# Patient Record
Sex: Male | Born: 1935
Health system: Southern US, Community
[De-identification: ages and names within clinical notes are randomized; demographics above are authoritative.]

## PROBLEM LIST (undated history)

## (undated) DIAGNOSIS — K5790 Diverticulosis of intestine, part unspecified, without perforation or abscess without bleeding: Secondary | ICD-10-CM

## (undated) DIAGNOSIS — R251 Tremor, unspecified: Secondary | ICD-10-CM

## (undated) DIAGNOSIS — N134 Hydroureter: Secondary | ICD-10-CM

## (undated) DIAGNOSIS — I4821 Permanent atrial fibrillation: Secondary | ICD-10-CM

## (undated) DIAGNOSIS — R339 Retention of urine, unspecified: Secondary | ICD-10-CM

## (undated) DIAGNOSIS — I739 Peripheral vascular disease, unspecified: Secondary | ICD-10-CM

## (undated) DIAGNOSIS — N1330 Unspecified hydronephrosis: Secondary | ICD-10-CM

## (undated) DIAGNOSIS — I10 Essential (primary) hypertension: Secondary | ICD-10-CM

## (undated) DIAGNOSIS — G4733 Obstructive sleep apnea (adult) (pediatric): Secondary | ICD-10-CM

## (undated) DIAGNOSIS — H8190 Unspecified disorder of vestibular function, unspecified ear: Secondary | ICD-10-CM

## (undated) DIAGNOSIS — N471 Phimosis: Secondary | ICD-10-CM

## (undated) DIAGNOSIS — I2109 ST elevation (STEMI) myocardial infarction involving other coronary artery of anterior wall: Secondary | ICD-10-CM

## (undated) DIAGNOSIS — E662 Morbid (severe) obesity with alveolar hypoventilation: Secondary | ICD-10-CM

## (undated) DIAGNOSIS — M6282 Rhabdomyolysis: Secondary | ICD-10-CM

## (undated) DIAGNOSIS — J449 Chronic obstructive pulmonary disease, unspecified: Secondary | ICD-10-CM

## (undated) DIAGNOSIS — I4891 Unspecified atrial fibrillation: Secondary | ICD-10-CM

## (undated) DIAGNOSIS — H409 Unspecified glaucoma: Secondary | ICD-10-CM

## (undated) DIAGNOSIS — N529 Male erectile dysfunction, unspecified: Secondary | ICD-10-CM

## (undated) DIAGNOSIS — Z978 Presence of other specified devices: Secondary | ICD-10-CM

## (undated) DIAGNOSIS — R32 Unspecified urinary incontinence: Secondary | ICD-10-CM

## (undated) DIAGNOSIS — E291 Testicular hypofunction: Secondary | ICD-10-CM

## (undated) DIAGNOSIS — E876 Hypokalemia: Secondary | ICD-10-CM

## (undated) DIAGNOSIS — I509 Heart failure, unspecified: Secondary | ICD-10-CM

## (undated) DIAGNOSIS — I6529 Occlusion and stenosis of unspecified carotid artery: Secondary | ICD-10-CM

## (undated) DIAGNOSIS — I2510 Atherosclerotic heart disease of native coronary artery without angina pectoris: Secondary | ICD-10-CM

## (undated) DIAGNOSIS — I639 Cerebral infarction, unspecified: Secondary | ICD-10-CM

## (undated) DIAGNOSIS — E669 Obesity, unspecified: Secondary | ICD-10-CM

## (undated) DIAGNOSIS — J189 Pneumonia, unspecified organism: Secondary | ICD-10-CM

## (undated) DIAGNOSIS — N189 Chronic kidney disease, unspecified: Secondary | ICD-10-CM

## (undated) DIAGNOSIS — E7800 Pure hypercholesterolemia, unspecified: Secondary | ICD-10-CM

## (undated) DIAGNOSIS — R319 Hematuria, unspecified: Secondary | ICD-10-CM

## (undated) DIAGNOSIS — E119 Type 2 diabetes mellitus without complications: Secondary | ICD-10-CM

## (undated) DIAGNOSIS — I129 Hypertensive chronic kidney disease with stage 1 through stage 4 chronic kidney disease, or unspecified chronic kidney disease: Secondary | ICD-10-CM

## (undated) HISTORY — DX: Essential (primary) hypertension: I10

## (undated) HISTORY — DX: Hypertensive chronic kidney disease with stage 1 through stage 4 chronic kidney disease, or unspecified chronic kidney disease: I12.9

## (undated) HISTORY — DX: Type 2 diabetes mellitus without complications: E11.9

## (undated) HISTORY — PX: LUMBAR FUSION: SHX111

## (undated) HISTORY — DX: ST elevation (STEMI) myocardial infarction involving other coronary artery of anterior wall: I21.09

## (undated) HISTORY — DX: Unspecified atrial fibrillation: I48.91

## (undated) HISTORY — DX: Obesity, unspecified: E66.9

## (undated) HISTORY — DX: Chronic obstructive pulmonary disease, unspecified: J44.9

## (undated) HISTORY — DX: Atherosclerotic heart disease of native coronary artery without angina pectoris: I25.10

## (undated) HISTORY — PX: NOSE SURGERY: SHX723

## (undated) HISTORY — DX: Diverticulosis of intestine, part unspecified, without perforation or abscess without bleeding: K57.90

## (undated) HISTORY — DX: Hypokalemia: E87.6

## (undated) HISTORY — DX: Morbid (severe) obesity with alveolar hypoventilation: E66.2

## (undated) HISTORY — DX: Chronic kidney disease, unspecified: N18.9

## (undated) HISTORY — PX: CARDIAC CATHETERIZATION: SHX172

## (undated) HISTORY — DX: Heart failure, unspecified: I50.9

## (undated) HISTORY — DX: Rhabdomyolysis: M62.82

## (undated) HISTORY — DX: Cerebral infarction, unspecified: I63.9

## (undated) HISTORY — DX: Occlusion and stenosis of unspecified carotid artery: I65.29

## (undated) HISTORY — DX: Obstructive sleep apnea (adult) (pediatric): G47.33

## (undated) HISTORY — DX: Unspecified glaucoma: H40.9

## (undated) HISTORY — DX: Unspecified disorder of vestibular function, unspecified ear: H81.90

## (undated) HISTORY — DX: Pure hypercholesterolemia, unspecified: E78.00

---

## 1983-07-12 HISTORY — PX: APPENDECTOMY: SHX54

## 1983-07-12 HISTORY — PX: INGUINAL HERNIA REPAIR: SHX194

## 1989-07-11 HISTORY — PX: KNEE ARTHROSCOPY: SHX127

## 1995-08-14 HISTORY — PX: CORONARY ANGIOPLASTY: SHX604

## 2000-02-04 ENCOUNTER — Ambulatory Visit (HOSPITAL_COMMUNITY): Admission: RE | Admit: 2000-02-04 | Discharge: 2000-02-04 | Payer: Self-pay | Admitting: Cardiology

## 2000-02-04 ENCOUNTER — Encounter: Payer: Self-pay | Admitting: Cardiology

## 2000-06-10 HISTORY — PX: CHOLECYSTECTOMY: SHX55

## 2000-06-16 ENCOUNTER — Encounter: Admission: RE | Admit: 2000-06-16 | Discharge: 2000-06-16 | Payer: Self-pay

## 2000-06-23 ENCOUNTER — Encounter: Payer: Self-pay | Admitting: General Surgery

## 2000-06-26 ENCOUNTER — Observation Stay (HOSPITAL_COMMUNITY): Admission: RE | Admit: 2000-06-26 | Discharge: 2000-06-27 | Payer: Self-pay | Admitting: General Surgery

## 2000-06-26 ENCOUNTER — Encounter (INDEPENDENT_AMBULATORY_CARE_PROVIDER_SITE_OTHER): Payer: Self-pay

## 2003-04-16 ENCOUNTER — Encounter: Admission: RE | Admit: 2003-04-16 | Discharge: 2003-04-16 | Payer: Self-pay

## 2003-05-02 ENCOUNTER — Ambulatory Visit (HOSPITAL_COMMUNITY): Admission: RE | Admit: 2003-05-02 | Discharge: 2003-05-02 | Payer: Self-pay | Admitting: Otolaryngology

## 2003-12-08 ENCOUNTER — Emergency Department (HOSPITAL_COMMUNITY): Admission: EM | Admit: 2003-12-08 | Discharge: 2003-12-09 | Payer: Self-pay | Admitting: Emergency Medicine

## 2006-01-20 ENCOUNTER — Encounter: Admission: RE | Admit: 2006-01-20 | Discharge: 2006-01-20 | Payer: Self-pay | Admitting: Neurosurgery

## 2006-01-22 ENCOUNTER — Encounter: Admission: RE | Admit: 2006-01-22 | Discharge: 2006-01-22 | Payer: Self-pay | Admitting: Neurosurgery

## 2006-04-06 ENCOUNTER — Ambulatory Visit (HOSPITAL_COMMUNITY): Admission: RE | Admit: 2006-04-06 | Discharge: 2006-04-06 | Payer: Self-pay | Admitting: Cardiology

## 2006-05-08 ENCOUNTER — Inpatient Hospital Stay (HOSPITAL_COMMUNITY): Admission: RE | Admit: 2006-05-08 | Discharge: 2006-05-15 | Payer: Self-pay | Admitting: Neurosurgery

## 2006-05-09 ENCOUNTER — Ambulatory Visit: Payer: Self-pay | Admitting: Physical Medicine & Rehabilitation

## 2007-07-30 ENCOUNTER — Encounter (INDEPENDENT_AMBULATORY_CARE_PROVIDER_SITE_OTHER): Payer: Self-pay | Admitting: *Deleted

## 2007-07-30 ENCOUNTER — Ambulatory Visit (HOSPITAL_COMMUNITY): Admission: RE | Admit: 2007-07-30 | Discharge: 2007-07-30 | Payer: Self-pay | Admitting: *Deleted

## 2008-07-21 ENCOUNTER — Encounter: Admission: RE | Admit: 2008-07-21 | Discharge: 2008-07-21 | Payer: Self-pay | Admitting: Orthopaedic Surgery

## 2008-09-01 ENCOUNTER — Encounter (HOSPITAL_COMMUNITY): Admission: RE | Admit: 2008-09-01 | Discharge: 2008-11-30 | Payer: Self-pay | Admitting: Cardiology

## 2009-11-25 ENCOUNTER — Encounter: Admission: RE | Admit: 2009-11-25 | Discharge: 2009-11-25 | Payer: Self-pay | Admitting: Otolaryngology

## 2009-12-22 ENCOUNTER — Emergency Department (HOSPITAL_COMMUNITY): Admission: EM | Admit: 2009-12-22 | Discharge: 2009-12-22 | Payer: Self-pay | Admitting: Emergency Medicine

## 2009-12-23 ENCOUNTER — Inpatient Hospital Stay (HOSPITAL_COMMUNITY): Admission: EM | Admit: 2009-12-23 | Discharge: 2009-12-28 | Payer: Self-pay | Admitting: Emergency Medicine

## 2009-12-24 ENCOUNTER — Encounter (INDEPENDENT_AMBULATORY_CARE_PROVIDER_SITE_OTHER): Payer: Self-pay | Admitting: Internal Medicine

## 2009-12-24 ENCOUNTER — Ambulatory Visit: Payer: Self-pay | Admitting: Physical Medicine & Rehabilitation

## 2010-05-13 ENCOUNTER — Emergency Department (HOSPITAL_COMMUNITY): Admission: EM | Admit: 2010-05-13 | Discharge: 2010-05-13 | Payer: Self-pay | Admitting: Emergency Medicine

## 2010-08-11 ENCOUNTER — Emergency Department (HOSPITAL_COMMUNITY): Payer: Medicare Other

## 2010-08-11 ENCOUNTER — Inpatient Hospital Stay (HOSPITAL_COMMUNITY)
Admission: EM | Admit: 2010-08-11 | Discharge: 2010-08-13 | DRG: 312 | Disposition: A | Discharge status: Home / Self Care | Payer: Medicare Other | Attending: Cardiology | Admitting: Cardiology

## 2010-08-11 DIAGNOSIS — Z9181 History of falling: Secondary | ICD-10-CM

## 2010-08-11 DIAGNOSIS — E876 Hypokalemia: Secondary | ICD-10-CM | POA: Diagnosis present

## 2010-08-11 DIAGNOSIS — Z7982 Long term (current) use of aspirin: Secondary | ICD-10-CM

## 2010-08-11 DIAGNOSIS — K219 Gastro-esophageal reflux disease without esophagitis: Secondary | ICD-10-CM | POA: Diagnosis present

## 2010-08-11 DIAGNOSIS — Z9861 Coronary angioplasty status: Secondary | ICD-10-CM

## 2010-08-11 DIAGNOSIS — J440 Chronic obstructive pulmonary disease with acute lower respiratory infection: Secondary | ICD-10-CM | POA: Diagnosis present

## 2010-08-11 DIAGNOSIS — I10 Essential (primary) hypertension: Secondary | ICD-10-CM | POA: Diagnosis present

## 2010-08-11 DIAGNOSIS — E662 Morbid (severe) obesity with alveolar hypoventilation: Secondary | ICD-10-CM | POA: Diagnosis present

## 2010-08-11 DIAGNOSIS — I440 Atrioventricular block, first degree: Secondary | ICD-10-CM | POA: Diagnosis present

## 2010-08-11 DIAGNOSIS — Z8249 Family history of ischemic heart disease and other diseases of the circulatory system: Secondary | ICD-10-CM

## 2010-08-11 DIAGNOSIS — E119 Type 2 diabetes mellitus without complications: Secondary | ICD-10-CM | POA: Diagnosis present

## 2010-08-11 DIAGNOSIS — I4891 Unspecified atrial fibrillation: Secondary | ICD-10-CM | POA: Diagnosis present

## 2010-08-11 DIAGNOSIS — E7800 Pure hypercholesterolemia, unspecified: Secondary | ICD-10-CM | POA: Diagnosis present

## 2010-08-11 DIAGNOSIS — I2510 Atherosclerotic heart disease of native coronary artery without angina pectoris: Secondary | ICD-10-CM | POA: Diagnosis present

## 2010-08-11 DIAGNOSIS — I252 Old myocardial infarction: Secondary | ICD-10-CM

## 2010-08-11 DIAGNOSIS — R55 Syncope and collapse: Principal | ICD-10-CM | POA: Diagnosis present

## 2010-08-11 DIAGNOSIS — K5730 Diverticulosis of large intestine without perforation or abscess without bleeding: Secondary | ICD-10-CM | POA: Diagnosis present

## 2010-08-11 DIAGNOSIS — J209 Acute bronchitis, unspecified: Secondary | ICD-10-CM | POA: Diagnosis present

## 2010-08-11 DIAGNOSIS — Z7902 Long term (current) use of antithrombotics/antiplatelets: Secondary | ICD-10-CM

## 2010-08-11 DIAGNOSIS — H409 Unspecified glaucoma: Secondary | ICD-10-CM | POA: Diagnosis present

## 2010-08-11 DIAGNOSIS — G4733 Obstructive sleep apnea (adult) (pediatric): Secondary | ICD-10-CM | POA: Diagnosis present

## 2010-08-11 LAB — POCT CARDIAC MARKERS
CKMB, poc: 2.5 ng/mL (ref 1.0–8.0)
Troponin i, poc: <0.05 ng/mL (ref 0.00–0.09)

## 2010-08-11 LAB — COMPREHENSIVE METABOLIC PANEL
ALT: 22 U/L (ref 0–53)
AST: 18 U/L (ref 0–37)
Albumin: 3.5 g/dL (ref 3.5–5.2)
Calcium: 9.2 mg/dL (ref 8.4–10.5)
Creatinine, Ser: 1.28 mg/dL (ref 0.4–1.5)
GFR calc Af Amer: >60 mL/min (ref >60)
GFR calc non Af Amer: 55 mL/min — ABNORMAL LOW (ref >60)
Sodium: 145 mEq/L (ref 135–145)
Total Protein: 6.3 g/dL (ref 6.0–8.3)

## 2010-08-11 LAB — PROTIME-INR
INR: 1.1 (ref 0.00–1.49)
Prothrombin Time: 14.4 seconds (ref 11.6–15.2)

## 2010-08-11 LAB — CBC
MCH: 29.3 pg (ref 26.0–34.0)
Platelets: 155 10*3/uL (ref 150–400)
RBC: 5.05 MIL/uL (ref 4.22–5.81)
WBC: 11.7 10*3/uL — ABNORMAL HIGH (ref 4.0–10.5)

## 2010-08-11 LAB — DIFFERENTIAL
Basophils Relative: 0 % (ref 0–1)
Eosinophils Absolute: 0.1 10*3/uL (ref 0.0–0.7)
Neutrophils Relative %: 79 % — ABNORMAL HIGH (ref 43–77)

## 2010-08-11 LAB — APTT: aPTT: 29 seconds (ref 24–37)

## 2010-08-11 LAB — BASIC METABOLIC PANEL
BUN: 14 mg/dL (ref 6–23)
Chloride: 107 mEq/L (ref 96–112)
Creatinine, Ser: 1.35 mg/dL (ref 0.4–1.5)

## 2010-08-11 LAB — CK TOTAL AND CKMB (NOT AT ARMC): Relative Index: 1.1 (ref 0.0–2.5)

## 2010-08-11 LAB — GLUCOSE, CAPILLARY: Glucose-Capillary: 132 mg/dL — ABNORMAL HIGH (ref 70–99)

## 2010-08-11 LAB — TROPONIN I: Troponin I: 0.05 ng/mL (ref 0.00–0.06)

## 2010-08-12 ENCOUNTER — Inpatient Hospital Stay (HOSPITAL_COMMUNITY): Payer: Medicare Other

## 2010-08-12 LAB — CBC
HCT: 46.6 % (ref 39.0–52.0)
MCHC: 33.5 g/dL (ref 30.0–36.0)
Platelets: 168 10*3/uL (ref 150–400)
RDW: 15.4 % (ref 11.5–15.5)
WBC: 13.2 10*3/uL — ABNORMAL HIGH (ref 4.0–10.5)

## 2010-08-12 LAB — BASIC METABOLIC PANEL
Calcium: 9 mg/dL (ref 8.4–10.5)
GFR calc non Af Amer: 50 mL/min — ABNORMAL LOW (ref >60)
Glucose, Bld: 145 mg/dL — ABNORMAL HIGH (ref 70–99)
Sodium: 143 mEq/L (ref 135–145)

## 2010-08-12 LAB — GLUCOSE, CAPILLARY: Glucose-Capillary: 95 mg/dL (ref 70–99)

## 2010-08-12 LAB — TSH: TSH: 2.255 u[IU]/mL (ref 0.350–4.500)

## 2010-08-12 LAB — LIPID PANEL
LDL Cholesterol: 81 mg/dL (ref 0–99)
Total CHOL/HDL Ratio: 2.9 RATIO
Triglycerides: 101 mg/dL (ref <150)
VLDL: 20 mg/dL (ref 0–40)

## 2010-08-12 LAB — HEMOGLOBIN A1C
Hgb A1c MFr Bld: 6.5 % — ABNORMAL HIGH (ref <5.7)
Mean Plasma Glucose: 140 mg/dL — ABNORMAL HIGH (ref <117)

## 2010-08-12 LAB — CARDIAC PANEL(CRET KIN+CKTOT+MB+TROPI)
CK, MB: 4.1 ng/mL — ABNORMAL HIGH (ref 0.3–4.0)
Troponin I: 0.04 ng/mL (ref 0.00–0.06)
Troponin I: 0.03 ng/mL (ref 0.00–0.06)

## 2010-08-13 ENCOUNTER — Inpatient Hospital Stay (HOSPITAL_COMMUNITY): Payer: Medicare Other

## 2010-08-13 DIAGNOSIS — R55 Syncope and collapse: Secondary | ICD-10-CM

## 2010-08-13 LAB — CBC
Platelets: 162 10*3/uL (ref 150–400)
RDW: 15.4 % (ref 11.5–15.5)
WBC: 11.4 10*3/uL — ABNORMAL HIGH (ref 4.0–10.5)

## 2010-08-13 LAB — BASIC METABOLIC PANEL
GFR calc Af Amer: >60 mL/min (ref >60)
GFR calc non Af Amer: 56 mL/min — ABNORMAL LOW (ref >60)
Potassium: 3 mEq/L — ABNORMAL LOW (ref 3.5–5.1)
Sodium: 140 mEq/L (ref 135–145)

## 2010-08-13 LAB — GLUCOSE, CAPILLARY: Glucose-Capillary: 131 mg/dL — ABNORMAL HIGH (ref 70–99)

## 2010-08-13 MED ORDER — TECHNETIUM TC 99M TETROFOSMIN IV KIT: 30.0000 | PACK | Freq: Once | INTRAVENOUS | Status: DC | PRN

## 2010-08-13 MED ORDER — TECHNETIUM TC 99M TETROFOSMIN IV KIT: 10.0000 | PACK | Freq: Once | INTRAVENOUS | Status: DC | PRN

## 2010-08-18 LAB — CULTURE, BLOOD (ROUTINE X 2)
Culture  Setup Time: 201202021005
Culture  Setup Time: 201202021006

## 2010-08-25 NOTE — Discharge Summary (Signed)
NAMEMARQUITA, [name] NO.:  [id number]  MEDICAL RECORD NO.:  [id number]           PATIENT TYPE:  I  LOCATION:  2004                         FACILITY:  [facility]  PHYSICIAN:  Troi Bechtold N. Terrence Dupont, M.D. DATE OF BIRTH:  October 13, 1935  DATE OF ADMISSION:  08/11/2010 DATE OF DISCHARGE:  08/13/2010                              DISCHARGE SUMMARY   ADMITTING DIAGNOSES: 1. Status post near-syncope/weakness, rule out transient ischemic     attack, rule out cardiac arrhythmias. 2. Status post chest pain, rule out coronary insufficiency, rule out     myocardial infarction. 3. Uncontrolled hypertension. 4. Noninsulin-dependent diabetes mellitus controlled by diet. 5. History of atrial fibrillation in the past. 6. Coronary artery disease. 7. History of anteroseptal wall myocardial infarction  in the past,     status post percutaneous coronary intervention to left anterior     descending coronary artery in the past. 8. Hypercholesteremia. 9. Morbid obesity. 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 11.Chronic obstructive pulmonary disease. 12.Obstructive sleep apnea. 13.Obesity hypoventilation syndrome. 14.Mild renal insufficiency. 15.Hypokalemia.  DISCHARGE DIAGNOSES: 1. Status post near-syncope.  Workup negative so far.  CT of the brain     negative.  Duplex ultrasound of the carotids, no significant     carotid artery stenosis.  Two-D echo showed good left ventricular     systolic function with apical hypokinesia, near-normal left     ventricular systolic function. 2. Coronary artery disease, stable. 3. History of anteroseptal wall myocardial infarction in the past. 4. Hypertension. 5. Noninsulin-dependent diabetes mellitus controlled by diet. 6. History of atrial fibrillation in the past. 7. Hypercholesteremia. 8. Morbid obesity. 9. Chronic obstructive pulmonary disease. 10.Obstructive sleep apnea. 11.Obesity hypoventilation syndrome. 12.History of  recurrent fall secondary to vestibulopathy.  Workup     negative in the past also. 13.Hypercholesteremia. 14.Resolving bronchitis.  DISCHARGE HOME MEDICATIONS: 1. Enteric-coated aspirin 81 mg 1 tablet daily. 2. Plavix 75 mg 1 tablet daily. 3. Atenolol 25 mg 1 tablet daily. 4. Lisinopril 20 mg 1 tablet twice daily. 5. Micardis HCT 80/25 mg 1 tablet daily. 6. K-Dur 20 mEq 1 tablet 3 times a days as before. 7. Amiodarone 200 mg 1 tablet daily. 8. Avelox 400 mg 1 tablet daily for 10 days. 9. Symbicort 160/4.5 mcg 1 puff twice daily. 10.Lipitor 20 mg 1 tablet daily.  DIET:  Low salt, low cholesterol, 1800 calories ADA diet.  The patient has been advised to reduce weight.  CONDITION AT DISCHARGE:  Stable.  Follow up with me in 1 week.  BRIEF HISTORY:  Reginald [name] is a 75 year old white male with past medical history significant for coronary artery disease, status post PCI to LAD in 1997, history of anteroseptal wall MI in the past in 1997, hypertension, noninsulin-dependent diabetes mellitus controlled by diet, history of atrial fibrillation, history of recurrent falls secondary to vestibulopathy, morbid obesity, degenerative joint disease, mild renal insufficiency.  He came to the ER via EMS following near-syncopal episode associated with weakness in both legs.  Denies any weakness in arms, slurred speech, or blurring of vision, but his gait was unsteady. He states earlier had chest tightness associated  with shortness of breath lasting few minutes prior to the weakness in his legs and near syncopal episode.  Denies any palpitation or lightheadedness.  Denies history of PND, orthopnea, or leg swelling.  Denies any cough, fever, or chills.  Denies any urinary complaints.  PAST MEDICAL HISTORY:  As above.  PAST SURGICAL HISTORY:  He had right inguinal hernia repair in 1986, had PCI to LAD in 1997, had nasal surgery in 1970s, had left knee surgery in 1991, had lumbar fusion few years  ago.  ALLERGIES:  No known drug allergies.  MEDICATION AT HOME:  He was on: 1. Amiodarone 200 mg p.o. daily. 2. Micardis HCT 80/25 p.o. daily. 3. K-Dur 20 mEq p.o. t.i.d. 4. Lipitor 20 mg p.o. daily. 5. Lisinopril 20 mg p.o. b.i.d. 6. Enteric-coated aspirin 81 mg p.o. daily. 7. Plavix 75 mg p.o. daily. 8. Atenolol 25 mg p.o. daily.  SOCIAL HISTORY:  He is divorced, lives in [place], retired.  No history of smoking or alcohol abuse.  FAMILY HISTORY:  Positive for coronary artery disease.  PHYSICAL EXAMINATION:  GENERAL:  He was alert, awake, and oriented x3 in no acute distress. VITAL SIGNS:  Initial blood pressure was 147/126, pulse was 87 and regular. HEENT:  Conjunctivae was pink. NECK:  Supple, no JVD, no bruits. LUNGS:  Clear to auscultation without rhonchi or rales with decreased breath sounds at bases.  CARDIOVASCULAR:  S1 and S2 was normal.  There was soft systolic murmur.  There was no S3 gallop. ABDOMEN:  Soft.  Bowel sounds were present, obese, nontender. EXTREMITIES:  There is no clubbing, cyanosis, or edema. NEUROLOGIC:  Grossly intact.  LABORATORY DATA:  His EKG showed normal sinus rhythm with septal Q-waves with first-degree AV block, no acute ischemic changes were noted.  Chest x-ray showed low lung volumes with no acute ischemic changes.  Repeat chest x-ray done on August 12, 2010 showed borderline heart size with hazy, patchy infiltrative densities with no evidence of consolidation, minimal right basilar atelectasis.  Persantine Myoview scan done yesterday, which showed subtle matched reduced activity in the anterior wall suggestive of mild scar.  There was no evidence of inducible ischemia with EF of 58%.  His CT of the brain showed no acute intracranial abnormalities, stable cerebral atrophy, and small chronic small vessel disease.  His other labs, his hemoglobin was 14.8, hematocrit 43.9, white count of 11.7 with shift to the left.  His two sets of  cardiac enzymes were normal.  Sodium was 138, potassium 3.4, BUN 14, creatinine 1.35, glucose was 145.  His TSH was 2.25.  Hemoglobin A1c was 6.5.  His cholesterol was 154, LDL 81, HDL 53, triglycerides were 101, magnesium was 1.9.  His blood cultures x2 were negative.  His duplex ultrasound of the carotids showed no significant internal carotid artery stenosis, mild plaque disease bilaterally.  BRIEF HOSPITAL COURSE:  The patient was admitted to telemetry unit.  MI was ruled out by serial enzymes and EKG.  The patient did spike temperature to 101.9 and on August 12, 2010 associated with coughing and mild shortness of breath.  Pancultures were obtained.  The patient was started on IV Avelox with improvement in his symptoms and breathing. The patient was also started on Xopenex inhaler.  The patient subsequently underwent Persantine Myoview, which showed no evidence of reversible ischemia with mild stable scarring in the anterior wall with EF of 58%.  The patient did not have any further episodes of near syncope during the hospital stay.  His breathing has improved and has remained afebrile in the last 24 hours.  The patient will be discharged home on above medications and will be followed up in my office in 1 week.  His potassium today is 3.0, which has been replaced.  We will check his renal function as outpatient.     Allegra Lai. Terrence Dupont, M.D.     MNH/MEDQ  D:  08/13/2010  T:  08/14/2010  Job:  [id number]  Electronically Signed by Charolette Forward M.D. on 08/25/2010 11:09:41 PM

## 2010-08-26 ENCOUNTER — Encounter: Payer: Medicare Other | Attending: Cardiology | Admitting: *Deleted

## 2010-08-26 DIAGNOSIS — I2510 Atherosclerotic heart disease of native coronary artery without angina pectoris: Secondary | ICD-10-CM | POA: Insufficient documentation

## 2010-08-26 DIAGNOSIS — I10 Essential (primary) hypertension: Secondary | ICD-10-CM | POA: Insufficient documentation

## 2010-08-26 DIAGNOSIS — Z713 Dietary counseling and surveillance: Secondary | ICD-10-CM | POA: Insufficient documentation

## 2010-08-26 DIAGNOSIS — I4891 Unspecified atrial fibrillation: Secondary | ICD-10-CM | POA: Insufficient documentation

## 2010-09-21 LAB — CBC
HCT: 49.6 % (ref 39.0–52.0)
MCHC: 34.1 g/dL (ref 30.0–36.0)
MCV: 86.6 fL (ref 78.0–100.0)
RDW: 14.3 % (ref 11.5–15.5)

## 2010-09-21 LAB — URINALYSIS, ROUTINE W REFLEX MICROSCOPIC
Glucose, UA: NEGATIVE mg/dL
Hgb urine dipstick: NEGATIVE
Ketones, ur: NEGATIVE mg/dL
Protein, ur: NEGATIVE mg/dL

## 2010-09-21 LAB — COMPREHENSIVE METABOLIC PANEL
BUN: 18 mg/dL (ref 6–23)
Calcium: 9.2 mg/dL (ref 8.4–10.5)
Glucose, Bld: 124 mg/dL — ABNORMAL HIGH (ref 70–99)
Total Protein: 6.4 g/dL (ref 6.0–8.3)

## 2010-09-21 LAB — DIFFERENTIAL
Basophils Relative: 0 % (ref 0–1)
Lymphs Abs: 1.8 10*3/uL (ref 0.7–4.0)
Monocytes Relative: 10 % (ref 3–12)
Neutro Abs: 5.8 10*3/uL (ref 1.7–7.7)
Neutrophils Relative %: 67 % (ref 43–77)

## 2010-09-26 LAB — CBC
HCT: 43.3 % (ref 39.0–52.0)
Hemoglobin: 14.8 g/dL (ref 13.0–17.0)
Hemoglobin: 14.8 g/dL (ref 13.0–17.0)
Hemoglobin: 14.9 g/dL (ref 13.0–17.0)
Hemoglobin: 17.3 g/dL — ABNORMAL HIGH (ref 13.0–17.0)
MCHC: 34.2 g/dL (ref 30.0–36.0)
MCHC: 34.5 g/dL (ref 30.0–36.0)
Platelets: 127 10*3/uL — ABNORMAL LOW (ref 150–400)
RBC: 4.79 MIL/uL (ref 4.22–5.81)
RBC: 5.57 MIL/uL (ref 4.22–5.81)
RDW: 14.3 % (ref 11.5–15.5)
RDW: 14.6 % (ref 11.5–15.5)
WBC: 6.1 10*3/uL (ref 4.0–10.5)
WBC: 8.2 10*3/uL (ref 4.0–10.5)
WBC: 9.4 10*3/uL (ref 4.0–10.5)

## 2010-09-26 LAB — CK TOTAL AND CKMB (NOT AT ARMC)
CK, MB: 7.5 ng/mL (ref 0.3–4.0)
CK, MB: 8.8 ng/mL (ref 0.3–4.0)
Relative Index: 0.2 (ref 0.0–2.5)
Relative Index: 0.3 (ref 0.0–2.5)
Relative Index: 0.4 (ref 0.0–2.5)
Total CK: 2159 U/L — ABNORMAL HIGH (ref 7–232)

## 2010-09-26 LAB — DIFFERENTIAL
Basophils Absolute: 0 10*3/uL (ref 0.0–0.1)
Basophils Relative: 0 % (ref 0–1)
Lymphocytes Relative: 6 % — ABNORMAL LOW (ref 12–46)
Neutro Abs: 7.9 10*3/uL — ABNORMAL HIGH (ref 1.7–7.7)
Neutrophils Relative %: 84 % — ABNORMAL HIGH (ref 43–77)

## 2010-09-26 LAB — URINALYSIS, ROUTINE W REFLEX MICROSCOPIC
Glucose, UA: NEGATIVE mg/dL
Leukocytes, UA: NEGATIVE
Nitrite: NEGATIVE
Protein, ur: 30 mg/dL — AB
pH: 5.5 (ref 5.0–8.0)

## 2010-09-26 LAB — LIPID PANEL
Cholesterol: 179 mg/dL (ref 0–200)
HDL: 37 mg/dL — ABNORMAL LOW (ref >39)
LDL Cholesterol: 106 mg/dL — ABNORMAL HIGH (ref 0–99)
Total CHOL/HDL Ratio: 4.8 RATIO
VLDL: 36 mg/dL (ref 0–40)

## 2010-09-26 LAB — BASIC METABOLIC PANEL
CO2: 27 mEq/L (ref 19–32)
Calcium: 9 mg/dL (ref 8.4–10.5)
Chloride: 101 mEq/L (ref 96–112)
Chloride: 105 mEq/L (ref 96–112)
Chloride: 99 mEq/L (ref 96–112)
GFR calc Af Amer: 52 mL/min — ABNORMAL LOW (ref >60)
GFR calc Af Amer: 60 mL/min — ABNORMAL LOW (ref >60)
GFR calc Af Amer: >60 mL/min (ref >60)
GFR calc non Af Amer: 43 mL/min — ABNORMAL LOW (ref >60)
GFR calc non Af Amer: 46 mL/min — ABNORMAL LOW (ref >60)
GFR calc non Af Amer: 49 mL/min — ABNORMAL LOW (ref >60)
GFR calc non Af Amer: 50 mL/min — ABNORMAL LOW (ref >60)
GFR calc non Af Amer: 57 mL/min — ABNORMAL LOW (ref >60)
Glucose, Bld: 142 mg/dL — ABNORMAL HIGH (ref 70–99)
Glucose, Bld: 146 mg/dL — ABNORMAL HIGH (ref 70–99)
Potassium: 3 mEq/L — ABNORMAL LOW (ref 3.5–5.1)
Potassium: 3.1 mEq/L — ABNORMAL LOW (ref 3.5–5.1)
Potassium: 3.4 mEq/L — ABNORMAL LOW (ref 3.5–5.1)
Potassium: 3.8 mEq/L (ref 3.5–5.1)
Sodium: 134 mEq/L — ABNORMAL LOW (ref 135–145)
Sodium: 136 mEq/L (ref 135–145)
Sodium: 136 mEq/L (ref 135–145)
Sodium: 138 mEq/L (ref 135–145)
Sodium: 139 mEq/L (ref 135–145)

## 2010-09-26 LAB — LACTIC ACID, PLASMA: Lactic Acid, Venous: 3.2 mmol/L — ABNORMAL HIGH (ref 0.5–2.2)

## 2010-09-26 LAB — CARDIAC PANEL(CRET KIN+CKTOT+MB+TROPI)
CK, MB: 3.7 ng/mL (ref 0.3–4.0)
CK, MB: 5.7 ng/mL — ABNORMAL HIGH (ref 0.3–4.0)
Relative Index: 0.2 (ref 0.0–2.5)
Relative Index: 0.2 (ref 0.0–2.5)
Relative Index: 2 (ref 0.0–2.5)
Total CK: 2056 U/L — ABNORMAL HIGH (ref 7–232)
Total CK: 334 U/L — ABNORMAL HIGH (ref 7–232)
Troponin I: 0.03 ng/mL (ref 0.00–0.06)
Troponin I: 0.06 ng/mL (ref 0.00–0.06)

## 2010-09-26 LAB — URINE MICROSCOPIC-ADD ON

## 2010-09-26 LAB — CK: Total CK: 717 U/L — ABNORMAL HIGH (ref 7–232)

## 2010-09-26 LAB — HEMOGLOBIN A1C
Hgb A1c MFr Bld: 6.4 % — ABNORMAL HIGH (ref <5.7)
Mean Plasma Glucose: 137 mg/dL — ABNORMAL HIGH (ref <117)

## 2010-09-26 LAB — URINE CULTURE: Colony Count: NO GROWTH

## 2010-09-26 LAB — CULTURE, BLOOD (ROUTINE X 2): Culture: NO GROWTH

## 2010-09-26 LAB — TROPONIN I
Troponin I: 0.06 ng/mL (ref 0.00–0.06)
Troponin I: 0.07 ng/mL — ABNORMAL HIGH (ref 0.00–0.06)

## 2010-09-26 LAB — HEPARIN LEVEL (UNFRACTIONATED)
Heparin Unfractionated: 0.36 IU/mL (ref 0.30–0.70)
Heparin Unfractionated: 0.39 IU/mL (ref 0.30–0.70)
Heparin Unfractionated: <0.1 IU/mL — ABNORMAL LOW (ref 0.30–0.70)

## 2010-09-26 LAB — BRAIN NATRIURETIC PEPTIDE: Pro B Natriuretic peptide (BNP): 60 pg/mL (ref 0.0–100.0)

## 2010-09-26 LAB — COMPREHENSIVE METABOLIC PANEL
ALT: 63 U/L — ABNORMAL HIGH (ref 0–53)
Albumin: 2.7 g/dL — ABNORMAL LOW (ref 3.5–5.2)
Calcium: 7.8 mg/dL — ABNORMAL LOW (ref 8.4–10.5)
GFR calc Af Amer: 54 mL/min — ABNORMAL LOW (ref >60)
Glucose, Bld: 137 mg/dL — ABNORMAL HIGH (ref 70–99)
Potassium: 2.5 mEq/L — CL (ref 3.5–5.1)
Sodium: 135 mEq/L (ref 135–145)
Total Protein: 5.3 g/dL — ABNORMAL LOW (ref 6.0–8.3)

## 2010-09-26 LAB — MAGNESIUM
Magnesium: 2.1 mg/dL (ref 1.5–2.5)
Magnesium: 2.1 mg/dL (ref 1.5–2.5)

## 2010-09-26 LAB — NA AND K (SODIUM & POTASSIUM), RAND UR: Potassium Urine: 41 mEq/L

## 2010-09-27 LAB — COMPREHENSIVE METABOLIC PANEL
BUN: 13 mg/dL (ref 6–23)
CO2: 31 mEq/L (ref 19–32)
Calcium: 9 mg/dL (ref 8.4–10.5)
Creatinine, Ser: 1.57 mg/dL — ABNORMAL HIGH (ref 0.4–1.5)
GFR calc non Af Amer: 44 mL/min — ABNORMAL LOW (ref >60)
Glucose, Bld: 126 mg/dL — ABNORMAL HIGH (ref 70–99)
Total Protein: 6.7 g/dL (ref 6.0–8.3)

## 2010-09-27 LAB — POCT I-STAT, CHEM 8
BUN: 14 mg/dL (ref 6–23)
Calcium, Ion: 1.06 mmol/L — ABNORMAL LOW (ref 1.12–1.32)
Chloride: 96 mEq/L (ref 96–112)
Glucose, Bld: 124 mg/dL — ABNORMAL HIGH (ref 70–99)
TCO2: 33 mmol/L (ref 0–100)

## 2010-09-27 LAB — URINALYSIS, ROUTINE W REFLEX MICROSCOPIC
Glucose, UA: NEGATIVE mg/dL
Leukocytes, UA: NEGATIVE
Nitrite: NEGATIVE
Protein, ur: 30 mg/dL — AB
Specific Gravity, Urine: 1.02 (ref 1.005–1.030)
Urobilinogen, UA: 1 mg/dL (ref 0.0–1.0)

## 2010-09-27 LAB — GLUCOSE, CAPILLARY
Glucose-Capillary: 130 mg/dL — ABNORMAL HIGH (ref 70–99)
Glucose-Capillary: 142 mg/dL — ABNORMAL HIGH (ref 70–99)

## 2010-09-27 LAB — DIFFERENTIAL
Lymphs Abs: 0.7 10*3/uL (ref 0.7–4.0)
Monocytes Relative: 13 % — ABNORMAL HIGH (ref 3–12)
Neutro Abs: 5.2 10*3/uL (ref 1.7–7.7)
Neutrophils Relative %: 77 % (ref 43–77)

## 2010-09-27 LAB — PROTIME-INR
INR: 1.09 (ref 0.00–1.49)
Prothrombin Time: 14 seconds (ref 11.6–15.2)

## 2010-09-27 LAB — CBC
MCHC: 34.7 g/dL (ref 30.0–36.0)
Platelets: 156 10*3/uL (ref 150–400)
RBC: 5.78 MIL/uL (ref 4.22–5.81)
RDW: 14.3 % (ref 11.5–15.5)

## 2010-09-27 LAB — APTT: aPTT: 28 seconds (ref 24–37)

## 2010-09-27 LAB — URINE MICROSCOPIC-ADD ON

## 2010-09-27 LAB — POCT CARDIAC MARKERS: Troponin i, poc: <0.05 ng/mL (ref 0.00–0.09)

## 2010-09-27 LAB — CK TOTAL AND CKMB (NOT AT ARMC)
CK, MB: 4.2 ng/mL — ABNORMAL HIGH (ref 0.3–4.0)
Total CK: 878 U/L — ABNORMAL HIGH (ref 7–232)

## 2010-09-27 LAB — RAPID STREP SCREEN (MED CTR MEBANE ONLY): Streptococcus, Group A Screen (Direct): NEGATIVE

## 2010-11-23 NOTE — Op Note (Signed)
Reginald [name], Reginald [name] NO.:  [id number]   MEDICAL RECORD NO.:  [id number]          PATIENT TYPE:  AMB   LOCATION:  ENDO                         FACILITY:  [facility]   PHYSICIAN:  Waverly Ferrari, M.D.    DATE OF BIRTH:  01/01/1936   DATE OF PROCEDURE:  07/30/2007  DATE OF DISCHARGE:                               OPERATIVE REPORT   PROCEDURE:  Colonoscopy.   INDICATIONS:  Colon cancer screening.   ANESTHESIA:  None further given.   PROCEDURE:  With the patient mildly sedated in the left lateral  decubitus position, rectal examination was performed which was negative  to my limited examination.  Subsequently the Pentax videoscopic  colonoscope was inserted the rectum and passed under direct vision to  the cecum identified by ileocecal valve and appendiceal orifice both of  which were photographed.  From this point colonoscope was slowly  withdrawn taking circumferential views of colonic mucosa stopping only  in the rectum which appeared normal indirect and showed hemorrhoids on  retroflexed view.  The endoscope was straightened and withdrawn.  The  patient's vital signs, pulse oximeter remained stable.  The patient  tolerated procedure well without apparent complications.   FINDINGS:  Internal hemorrhoids, otherwise an unremarkable examination  although some diverticula were seen in the sigmoid colon.   PLAN:  See endoscopy note for further details and follow-up.           ______________________________  Waverly Ferrari, M.D.     GMO/MEDQ  D:  07/30/2007  T:  07/30/2007  Job:  [id number]   cc:   [facility] [place] [place]

## 2010-11-23 NOTE — Op Note (Signed)
Reginald [name], COSSAIRT NO.:  [id number]   MEDICAL RECORD NO.:  [id number]          PATIENT TYPE:  AMB   LOCATION:  ENDO                         FACILITY:  [facility]   PHYSICIAN:  Waverly Ferrari, M.D.    DATE OF BIRTH:  1936-06-24   DATE OF PROCEDURE:  DATE OF DISCHARGE:                               OPERATIVE REPORT   PROCEDURE:  Upper endoscopy.   INDICATIONS:  Gastroesophageal reflux disease.   ANESTHESIA:  Demerol 70 mg, Versed 7.5 mg.   PROCEDURE:  With the patient mildly sedated in the left lateral  decubitus position, the Pentax videoscopic endoscope was inserted in the  mouth and passed under direct vision through the esophagus which  appeared normal.  There was no evidence of Barrett's esophagus.  We  entered into the stomach.  Fundus, body, antrum showed diffuse mild  erythema which was photographed and biopsied.  Duodenal bulb, second  portion duodenum appeared normal.  From this point the endoscope was  slowly withdrawn taking circumferential views of duodenal mucosa until  the endoscope had been pulled back into the stomach placed in  retroflexion to view the stomach from below.  The endoscope was  straightened and withdrawn taking circumferential views remaining  gastric and esophageal mucosa.  The patient's vital signs, pulse  oximeter remained stable.  The patient tolerated procedure well without  apparent complications.   FINDINGS:  Erythema stomach biopsied.  Await biopsy report.  The patient  will call me for results and follow-up with me as an outpatient as  needed.           ______________________________  Waverly Ferrari, M.D.     GMO/MEDQ  D:  07/30/2007  T:  07/30/2007  Job:  [id number]   cc:   [place] [facility]

## 2010-11-26 NOTE — Discharge Summary (Signed)
NAMEJEBRON, Reginald [name] NO.:  [id number]   MEDICAL RECORD NO.:  [id number]          PATIENT TYPE:  INP   LOCATION:  3010                         FACILITY:  [facility]   PHYSICIAN:  Ophelia Charter, M.D.DATE OF BIRTH:  January 13, 1936   DATE OF ADMISSION:  05/08/2006  DATE OF DISCHARGE:  05/15/2006                                 DISCHARGE SUMMARY   BRIEF HISTORY:  The patient is a 75 year old white male who has suffered  from back and bilateral hip and leg pain consistent with neurogenic  claudication.  He failed medical management.  He was worked up in lumbar MRI  which demonstrated he had a spondylolisthesis at L4-5.  He had a traditional  type vertebrae (which we had numbered as S1) with a spondylolisthesis and  degenerative disease and severe stenosis.  Discussed various treatment  methods with the patient including surgery.  The patient has weighed the  risks, benefits and alternatives of surgery and decided to proceed with an  L4-5 decompression and fusion.   For further details of this admission, please refer to typed History and  Physical.   HOSPITAL COURSE:  I admitted the patient to [facility] on May 08, 2006.  On the day of admission, I performed L4-5 decompression and  fusion and instrumentation.  Surgery went well.  For full details of this  operation, please refer to typed Operative Note.   POSTOPERATIVE COURSE:  The patient's postoperative course, however, is  remarkable only as follows.  The patient did have an elevated creatinine on  postop day #1 up to 1.6.  We followed this and his urine output was good and  decreased back to normal.  The patient did have a fever.  We obtained a  chest x-ray that demonstrates atelectasis.  The fever resolved.  The patient  did have some hypokalemia, which is a chronic problem for him, treated with  K-Dur, and this resolved.   We had PT/OT and PMR see the patient.  They did not think he needed  inpatient rehab, and they recommended skilled nursing facility.  I discussed  history with the patient.  He preferred to go to skilled nursing facility as  opposed to getting home PT/OT.  So, we made arrangements for transfer to the  skilled nursing facility.   By May 15, 2006, the patient was afebrile, vital signs stable, eating  well, ambulating well.  His wound was healing well without signs of  infection, and he was ready for discharge.  He was transferred to the  skilled nursing facility May 15, 2006.   DISCHARGE MEDICATIONS:  1. Colace 100 mg p.o. b.i.d.  2. Avapro 300 mg p.o. daily.  3. Hydrochlorothiazide 25 mg p.o. daily.  4. Tenormin 25 mg p.o. daily.  5. Zocor 40 mg p.o. daily.  6. Xalatan ophthalmic 0.005% one drop OU q.h.s.  7. Multivitamin one p.o. daily.  8. Lisinopril 20 mg p.o. b.i.d.  9. P.r.n. Percocet, Tylenol, Valium, Zofran, Ambien, Chloraseptic oral      spray.   FINAL DIAGNOSES:  1. L4-5 spondylolisthesis.  2. Spinal stenosis.  3. Degenerative disk disease.  4. Lumbago.  5. Lumbar radiculopathy.   PROCEDURES:  L4-5 posterior lumbar interbody fusion; insertion of bilateral  L4-5 interbody prosthesis with PEEK cages; posterior lateral segment  instrumentation with L4-5 with Legacy titanium pedicle screws and rods, L4-5  posterolateral arthrodesis with local morcellized bone and BMP bone graft  extender.   DISCHARGE INSTRUCTIONS:  The patient was instructed to follow up with me in  three weeks.   DISCHARGE PRESCRIPTIONS:  1. Percocet 10/325 #100, one p.o. q.4 h p.r.n. pain.  2. Valium 5 mg #50, one p.o. q.8 h p.r.n. muscle spasms.      Ophelia Charter, M.D.  Electronically Signed     JDJ/MEDQ  D:  05/15/2006  T:  05/15/2006  Job:  [id number]

## 2010-11-26 NOTE — Op Note (Signed)
[name], Reginald [name] NO.:  [id number]   MEDICAL RECORD NO.:  [id number]          PATIENT TYPE:  INP   LOCATION:  3010                         FACILITY:  [facility]   PHYSICIAN:  Reginald [name], M.D.DATE OF BIRTH:  1935-08-01   DATE OF PROCEDURE:  05/08/2006  DATE OF DISCHARGE:                                 OPERATIVE REPORT   BRIEF HISTORY:  The patient is a 75 year old white male who has suffered  from back and lateral hip and leg pain, consistent with neurogenic  claudication.  He failed medical management.  He was worked up with a lumbar  MRI, which demonstrated he had a spondylolisthesis at L4-L5, but he had a  transitional-type vertebra (which we had numbered as 1), with a  spondylolisthesis and degenerative disease and severe stenosis.  I discussed  the various treatment options with the patient, including surgery.  The  patient has weighed the risks, benefits, and alternatives of surgery and  decided to proceed with an L4-L5 decompression and fusion.   PREOPERATIVE DIAGNOSES:  L4-L5 spondylolisthesis, spinal stenosis, and  degenerative disease; lumbar radiculopathy and lumbago.   POSTOPERATIVE DIAGNOSES:  L4-L5 spondylolisthesis, spinal stenosis, and  degenerative disease; lumbar radiculopathy and lumbago.   PROCEDURE PERFORMED:  L4-L5 posterior lumbar interbody fusion; insertion of  bilateral L4-L5 interbody prostheses (Capstone PEEK cages); posterior  nonsegmental instrumentation, L4-L5, with Legacy titanium pedicle screws and  rods; L4-L5 posterolateral arthrodesis with local morsellized autograft bone  and Vitoss bone graft extender.   SURGEON:  Reginald [name], M.D.   ASSISTANT:  Dr. __________.   ANESTHESIA:  General endotracheal.   ESTIMATED BLOOD LOSS:  300 mL.   SPECIMENS:  None.   DRAINS:  None.   COMPLICATIONS:  None.   DESCRIPTION OF PROCEDURE:  The patient was brought to the operating room by  the anesthesia team.  General  endotracheal anesthesia was induced.  The  patient was then turned in the prone position on a Wilson frame.  His  lumbosacral region was then prepared with Betadine scrub and Betadine  solution.  Sterile drapes were applied.  The area to be incised was then  injected with Marcaine with epinephrine solution, and then used a scalpel to  make a linear midline incision over the L4-L5 interspace.  I used  electrocautery to perform a bilateral subperiosteal dissection, stripping  the paraspinous musculature from the spinous process lamina of L4 and L5.  We obtained intraoperative radiograph to confirm our location.  We then  inserted the [facility] retractor for exposure.   We began by using a high-speed drill to perform bilateral L4 laminotomies.  We widened the laminotomies with the Kerrison punch, decompressing the  thecal sac and performed foraminotomies about the bilateral L4 and L5 nerve  roots, completing the decompression.   We now turned our attention to the posterior lumbar interbody fusion.  We  incised the L4-L5 intervertebral disk with a 15-blade scalpel and performed  a bilateral diskectomy using the pituitary forceps and the Epstein and  Scoville curettes.  We prepared the vertebral endplates for the fusion by  removing all  the soft tissues using the curettes.  We then prefilled two 12  x 26-mm Capstone PEEK cages with local autograft bone and Vitoss bone graft  extender, and then inserted these prostheses into the L4-L5 interspace  bilaterally.  Of course, we retracted the neural structures out of harm's  way prior to placing these prostheses.  We filled in between the lateral and  anterior to the prostheses with Vitoss and autograft bone, completing the  posterior lumbar interbody fusion.   We now turned our attention to posterior instrumentation.  Under  fluoroscopic guidance, I cannulated bilateral L4 and L5 pedicles with the  bone probe.  We tapped the pedicles with a 5.5  mm tap, probed inside the  tapped pedicles to rule out cortical breaches, and then inserted 6.5 x 55-mm  pedicle screws bilaterally at L4 and L5 under fluoroscopic guidance.  We  then palpated along the medial surface of the bilateral L4 and L5 pedicles  and noted there were no cortical breaches, and the L4 and L5 nerve roots  were not injured.  We then connected the unilateral pedicles with a lordotic  rod, which we then placed the construct under slight compression and then  secured the rod in place with the caps, of which we tightened appropriately.   We now turned our attention to posterolateral arthrodesis.  We used the high-  speed drill to decorticate the remainder of the L4-L5 facet joint, L4 pars  region, etc., and then laid a combination of Vitoss and local autograft bone  on these two decorticated posterolateral structures, completing the  posterolateral arthrodesis.   We then obtained hemostasis using bipolar electrocautery, irrigated the  wound out with bacitracin solution.  We then inspected the thecal sac and  the L4 and L5 nerve roots and noted them to be well-decompressed.  We then  removed the Richardson retractor and then reapproximated the patient's  thoracolumbar fascia with an interrupted #1 Vicryl suture, subcutaneous  tissue using 2-0 Vicryl suture, and the skin with Steri-Strips and benzoin.  The wound was then coated with bacitracin ointment, a sterile dressing  applied.  The drapes were removed and the patient was subsequently returned  to supine position, where he was extubated by the anesthesia team and  transported to the [facility] in stable condition.  All  sponge, instrument, and needle counts were correct in this case.      Reginald [name], M.D.  Electronically Signed     JDJ/MEDQ  D:  05/09/2006  T:  05/09/2006  Job:  [id number]

## 2010-11-26 NOTE — Op Note (Signed)
[facility]  Patient:    Reginald [name], Reginald [name]                     MRN: [id number] Proc. Date: 06/26/00 Adm. Date:  [id number] Attending:  Harlin Rain CC:         Dr. Jeneen Rinks, [facility].   Operative Report  PROCEDURE:  Laparoscopic cholecystectomy.  SURGEON:  Dr. Rebekah Chesterfield.  ASSISTANT:  Dr. March Rummage.  ANESTHESIA:  General endotracheal.  PREOPERATIVE DIAGNOSES:  Gallstones.  POSTOPERATIVE DIAGNOSES:  Gallstones.  CLINICAL SUMMARY:  A 75 year old now admitted for elective cholecystectomy. He had significant medical problems with diverticulitis, and has had hernia surgery in the past as well as treated hypertension. He weighs about 280 pounds and has had an MI with 2 stents in place. He is taking aspirin but has stopped it before surgery. Gallbladder showed stones on CT scan to evaluate his abdominal pain and possible hernia. Liver function studies are normal.  OPERATIVE FINDINGS:  The patient was quite obese. The gallbladder was thin walled and the cystic duct and artery were normal in anatomy and size.  DESCRIPTION OF PROCEDURE:  Under satisfactory general endotracheal anesthesia, having received 1.0 gm Ancef preop, the patients abdomen was prepped and draped in a standard fashion. A transverse incision made above the umbilicus and the midline opened into the peritoneum. This was controlled with a figure-of-eight #0 Vicryl suture and Husson operating port inserted and secured. Good CO2 pneumoperitoneum established and camera placed. Then through Marcaine infiltrated skin incisions, two #5 ports placed laterally and a second #10 port medially. Graspers in the lateral port gave excellent exposure and operating through the medial port, I identified the gallbladder cystic duct junction carefully. The cystic duct and are were then each circumferentially dissected carefully with right angled clamp. When certain of the anatomy, each was divided  between multiple metal clips. A few lymphatics and the small artery were also divided between clips out on the gallbladder. The gallbladder was then removed from the liver bed with a coagulating spatula for hemostasis and dissection. The liver bed was made dry after the gallbladder was removed and the abdomen lavaged with saline and suctioned dry. Because of a small pin hole made in the gallbladder, it was placed in an endocatch bag and was placed through the upper port and then the camera moved there. The bag was then withdrawn through the umbilicus with a large clamp after securing it and there was on spillage or complication. The operative site was then checked again, lavaged with saline and suctioned dry. The ports removed under direct vision and CO2 released. The midline closed with a previous suture and a second interrupted #0 Vicryl suture. The umbilical incision was then infiltrated with Marcaine. The subcu approximated with 4-0 Vicryl and then sterile absorbent dressings applied after Steri-Strips used to approximate the skin. There were no complications and the sponge and needle counts were correct. DD:  06/26/00 TD:  06/27/00 Job: [id number]

## 2011-03-03 ENCOUNTER — Encounter: Payer: Medicare Other | Attending: Cardiology | Admitting: *Deleted

## 2011-03-03 DIAGNOSIS — E119 Type 2 diabetes mellitus without complications: Secondary | ICD-10-CM | POA: Insufficient documentation

## 2011-03-03 DIAGNOSIS — Z713 Dietary counseling and surveillance: Secondary | ICD-10-CM | POA: Insufficient documentation

## 2011-03-03 DIAGNOSIS — I10 Essential (primary) hypertension: Secondary | ICD-10-CM | POA: Insufficient documentation

## 2011-03-03 DIAGNOSIS — E7800 Pure hypercholesterolemia, unspecified: Secondary | ICD-10-CM | POA: Insufficient documentation

## 2011-03-03 NOTE — Progress Notes (Signed)
  Medical Nutrition Therapy:  Appt start time: 1500 end time:  [id number].  Assessment:  Primary concerns today: CAD, T2DM, Morbid Obesity, HTN - Follow up.  Pt reports soreness in legs.  Had U/S recently checking for DVT; No results yet. Has changed some eating habits, but still consuming sweets at times and eating many meals away from home.  Reports recent A1c of 6.7%, no change from value on 08/2010. States he does NOT want to go an "a pill". Reports he is now dancing 2 nights/week, but has to stop after 2 songs d/t leg pain. Answered multiple questions from pt and girlfriend regarding specific foods and supplements supported by Dr. Irena Cords ("raspberry ketone", "green coffee bean extract", and beets). Pt is not checking blood sugars, but reports a recent FBG of 131 mg/dL at MD office.  Weight is relatively unchanged. Increased Na and CHO intake noted.  MEDICATIONS: Klor-Con M20, ASA 81mg , Micardis HCT, Bystolic, AndroGel, Lipitor, Plavix, Amidarone HCl, GNC MVI multi-pack for joints (pt cannot recall name)  DIETARY INTAKE:  Pt reports eating sweet potatoes, grilled chicken salad, beets (night snack). Drinking 2% milk (1/2 gal per wk) and still eating most meals away from home.   Recent physical activity: Dances every Friday and Saturday night for 2 songs then his "legs give out".   Estimated energy needs: 2000 calories (per daughter-in-law's request to allow pt to "move into it slowly") 250 g carbohydrates 110-120 g protein 55 g fat  Progress Towards Goal(s):  In progress.   Nutritional Diagnosis:  [place]-2.1 Impaired nutrient utilization related to excessive CHO and fat intake as evidenced by patient-reported food recall and consistent A1c of 6.7% for 6 months.    Intervention/Goals:   Follow Diabetes Meal Plan as instructed (see yellow card).  Eat 3 meals and 2 snacks; or every 3-5 hrs - NO meal skipping.  Add lean protein foods to all meals/snacks.  Aim for 30 grams of fiber, <2000 mg sodium,  <200 mg dietary cholesterol, and <15 g saturated fat daily.   Decrease meals away from home and limit sugar-sweetened drinks, concentrated sweets, and high fat/fried foods.   Monitor glucose levels as instructed by your doctor.  Aim for 20-30 mins of physical activity 2-3 days a week.   Handouts given during visit include:  45-60g CHO menus, CHO/Pro snack list  K+ and Fiber foods lists  Supplements given:  CIB No Added Sugar (chocolate) - Qty 3; (Exp: 12/14/25/13)  Monitoring/Evaluation:  Dietary intake, exercise, A1c, and body weight in 6 week(s).

## 2011-03-03 NOTE — Patient Instructions (Addendum)
Goals:  Follow Diabetes Meal Plan as instructed (see yellow card).  Eat 3 meals and 2 snacks; or every 3-5 hrs - NO meal skipping.  Add lean protein foods to all meals/snacks.  Aim for 30 grams of fiber, <2000 mg sodium, <200 mg dietary cholesterol, and <15 g saturated fat daily.   Decrease meals away from home and limit sugar-sweetened drinks, concentrated sweets, and high fat/fried foods.   Monitor glucose levels as instructed by your doctor.  Aim for 20-30 mins of physical activity 2-3 days a week.

## 2011-03-05 ENCOUNTER — Encounter: Payer: Self-pay | Admitting: *Deleted

## 2011-05-04 ENCOUNTER — Encounter: Payer: Self-pay | Admitting: *Deleted

## 2011-05-04 ENCOUNTER — Encounter: Payer: Medicare Other | Attending: Cardiology | Admitting: *Deleted

## 2011-05-04 DIAGNOSIS — E119 Type 2 diabetes mellitus without complications: Secondary | ICD-10-CM | POA: Insufficient documentation

## 2011-05-04 DIAGNOSIS — E7800 Pure hypercholesterolemia, unspecified: Secondary | ICD-10-CM | POA: Insufficient documentation

## 2011-05-04 DIAGNOSIS — Z713 Dietary counseling and surveillance: Secondary | ICD-10-CM | POA: Insufficient documentation

## 2011-05-04 DIAGNOSIS — I10 Essential (primary) hypertension: Secondary | ICD-10-CM | POA: Insufficient documentation

## 2011-05-04 NOTE — Progress Notes (Signed)
  Medical Nutrition Therapy:  Appt start time: 1500 end time:  [id number].  Primary concerns today: CAD, T2DM, Morbid Obesity, HTN - Follow up.  Pt up 6 lbs with few changes noted in dietary intake. Continues to eat most meals away from home resulting in excessive intake of CHO, fat, and sodium.  Some increase in exercise noted, but still looking for a [employer] program. Reports ease of exercise in water and likes it.  Pt does not seem to fully comprehend CHO effects on blood sugar and which foods have CHO. Still skipping meals daily.  Discussed again at today's visit. Priority goals include staying within CHO ranges at meals and snacks, no meal skipping, and adding protein to all meals/snacks.     MEDICATIONS: No changes reported  DIETARY INTAKE:  2 meals/day + 0-2 snacks; cut down orange juice from 1-2 qts/wk to 1 qt every few weeks. No doughnuts, but reports a whole can of pineapples (90 g CHO) for hs snack last night.  Recent physical activity:  Continues to dance every Friday and Saturday night for 10 min, then his "legs give out"; Reports doing water activity with daughter-in-law 2-3 times since last visit for 1 hr/ea.  Estimated energy needs: 2000 calories (per daughter-in-law's request to allow pt to "move into it slowly") 250 g carbohydrates 110-120 g protein 55 g fat  Progress Towards Goal(s):  No progress.   Nutritional Diagnosis:  -2.1 Impaired nutrient utilization related to excessive CHO and fat intake as evidenced by patient-reported food recall and consistent A1c of 6.7% for 6 months.    Intervention/Goals:   Stay within carbohydrate ranges on yellow card; add lean protein foods to all meals/snacks to slow carb break down.  Eat 3 meals and 2 snacks; or every 3-5 hrs - NO meal skipping.  Decrease meals away from home and limit sugar-sweetened drinks, concentrated sweets, and high fat/fried foods.   Aim for 20-30 mins of physical activity 2-3 days a week.   Try Chobani  yogurt - get plain and add your own fruit.    Supplements given:  Fiber One Cereal sample pack: qty 3; 30 g ea Exp: 07/06/11  Monitoring/Evaluation:  Dietary intake, exercise, A1c, and body weight in 3 months (pt requests to return after holidays).

## 2011-05-04 NOTE — Patient Instructions (Addendum)
Goals:  Stay within carbohydrate ranges on yellow card; add lean protein foods to all meals/snacks to slow carb break down.  Eat 3 meals and 2 snacks; or every 3-5 hrs - NO meal skipping.  Decrease meals away from home and limit sugar-sweetened drinks, concentrated sweets, and high fat/fried foods.   Aim for 20-30 mins of physical activity 2-3 days a week.   Keep a food record for 4 days and either email to me or bring to next nutrition visit.  Try Chobani yogurt - get plain and add your own fruit.

## 2011-07-19 ENCOUNTER — Other Ambulatory Visit: Payer: Self-pay | Admitting: Cardiology

## 2011-08-03 ENCOUNTER — Encounter: Payer: Self-pay | Admitting: *Deleted

## 2011-08-03 ENCOUNTER — Encounter: Payer: Medicare Other | Attending: Cardiology | Admitting: *Deleted

## 2011-08-03 DIAGNOSIS — I2510 Atherosclerotic heart disease of native coronary artery without angina pectoris: Secondary | ICD-10-CM | POA: Insufficient documentation

## 2011-08-03 DIAGNOSIS — Z713 Dietary counseling and surveillance: Secondary | ICD-10-CM | POA: Insufficient documentation

## 2011-08-03 DIAGNOSIS — I10 Essential (primary) hypertension: Secondary | ICD-10-CM | POA: Insufficient documentation

## 2011-08-03 DIAGNOSIS — E119 Type 2 diabetes mellitus without complications: Secondary | ICD-10-CM | POA: Insufficient documentation

## 2011-08-03 NOTE — Patient Instructions (Signed)
Goals:  Continue previous goals...  Aim for 20-30 mins of physical activity 2-3 days a week.   Try Chobani yogurt - get plain and add your own fruit.

## 2011-08-03 NOTE — Progress Notes (Signed)
Medical Nutrition Therapy:  Appt start time: 1500  End time:  [id number].  Primary concerns today: T2DM, CAD,HTN, Morbid Obesity - Follow up.  Pt here with wt loss of 1 lb and reports eating 3 meals/day, but not all snacks. Continues to eat away from home, though reports his friend is cooking more for them at home. Now using Stevia and 3 day food recall provided by pt shows snacks of fruit and other CHO with no protein.  Portions better. Has joined [employer], though only offered at a facility 20 miles from home. Has not gone yet, but plans to. Discussed importance of exercise for DM control and weight loss.  Also encouraged to walk, which will help build strength in his legs. Reports new A1c of 6.3 or 6.4% at cardiologist last week and has appt with PCP next week for check. Also reports recent visit to nephrologist showed stable kidney function.  MEDICATIONS: Vesicare added.   DIETARY INTAKE:  3 meals/day + 0-2 snacks;   Recent physical activity:  Found [employer], but 20 miles from house. Has joined, but not gone yet. Continues to dance every Friday and Saturday night for 10 min, then his "legs give out"; Reports no further water activity with daughter-in-law.  Estimated energy needs: 2000 calories (per daughter-in-law's request to allow pt to "move into it slowly") 250 g carbohydrates 110-120 g protein 55 g fat  Progress Towards Goal(s):  Some progress; weight maintenance, joined gym. Continue.   Nutritional Diagnosis:  [place]-2.1 Impaired nutrient utilization related to excessive CHO and fat intake as evidenced by patient-reported food recall and consistent A1c of 6.7% for 6 months.    Intervention/Goals:   Continue previous goals...  Aim for 20-30 mins of physical activity 2-3 days a week.   Try Chobani yogurt - get plain and add your own fruit.    Monitoring/Evaluation:  Dietary intake, exercise, A1c, and body weight in 3 months.

## 2011-11-01 ENCOUNTER — Encounter: Payer: Self-pay | Admitting: *Deleted

## 2011-11-01 ENCOUNTER — Encounter: Payer: Medicare Other | Attending: Cardiology | Admitting: *Deleted

## 2011-11-01 DIAGNOSIS — E119 Type 2 diabetes mellitus without complications: Secondary | ICD-10-CM | POA: Insufficient documentation

## 2011-11-01 DIAGNOSIS — Z713 Dietary counseling and surveillance: Secondary | ICD-10-CM | POA: Insufficient documentation

## 2011-11-01 NOTE — Patient Instructions (Addendum)
Goals:   Continue previous goals.  Continue exercise regimen.    Try protein shake instead of orange juice - for the short term to train your appetite.  Have lean protein with all carbohydrates.   Check with MD for referral for blood glucose monitoring

## 2011-11-01 NOTE — Progress Notes (Signed)
Medical Nutrition Therapy:  Appt start time: 1500  End time:  [id number].  Primary concerns today: T2DM, CAD,HTN, Morbid Obesity - Follow up.  Reginald [name] returns today for F/U. Reports he can stay on his feet longer now since he started doing water exercises. Continues to eat excessive CHO at times. Going to PCP for DM labs next week. Inquires about getting a meter to check his sugars. Advised to ask MD for referral to [facility] for BGM.   TANITA  BODY COMP RESULTS  11/01/11     %Fat 36.2%     FM (lbs) 110.5     FFM (lbs) 195.0     TBW (lbs) 142.5      MEDICATIONS:  No changes reported.   DIETARY INTAKE:  3 meals/day    OJ at 10:30 Breakfast at 12 noon Supper @ 6-8 pm - Chicken and 1 c sweet peas  Recent physical activity:  Started water walking 2 days/week at the [facility] 2 weeks ago. Dances 2 night a week.   Estimated energy needs: 1800-2000 calories (per daughter-in-law's request to allow pt to "move into it slowly") 250 g carbohydrates 110-120 g protein 55 g fat  Progress Towards Goal(s):  In progress   Nutritional Diagnosis:  Reginald [name]-2.1 Impaired nutrient utilization related to excessive CHO and fat intake as evidenced by patient-reported food recall and consistent A1c of 6.7% for 6 months.    Intervention/Goals:   Continue previous goals.  Continue exercise regimen.    Try protein shake instead of orange juice - for the short term to train your appetite.  Have lean protein with all carbohydrates.   Check with MD for referral for blood glucose monitoring  Monitoring/Evaluation:  Dietary intake, exercise, A1c, and body weight in 3 months.

## 2011-12-06 ENCOUNTER — Encounter: Payer: Self-pay | Admitting: *Deleted

## 2011-12-06 ENCOUNTER — Encounter: Payer: Medicare Other | Attending: Cardiology | Admitting: *Deleted

## 2011-12-06 DIAGNOSIS — Z713 Dietary counseling and surveillance: Secondary | ICD-10-CM | POA: Insufficient documentation

## 2011-12-06 DIAGNOSIS — E119 Type 2 diabetes mellitus without complications: Secondary | ICD-10-CM | POA: Insufficient documentation

## 2011-12-06 NOTE — Patient Instructions (Signed)
GOALS: Monitoring Blood Glucose - Check 2-3 times/week (varying time of day); or as directed by your doctor.  Fasting glucose (80-120)  Pre-Meal (80-120)  2 hrs after the first bit of a meal (80-160)  Bedtime (100-140)  HgbA1C  Check every 3-6 months per MD  Goal <7%

## 2011-12-06 NOTE — Progress Notes (Signed)
  Medical Nutrition Therapy:  Appt start time: 1230 end time:  [id number].  Primary concerns today: BGM. Pt returns for blood glucose monitoring. Went over meter function, strips, inserting/removing lancets, correct hand-washing procedures, and when to check his glucose. Pt demonstrated proper procedure when checking BG himself. Pt is fasting and BG value = 148 mg at 1pm. Will notify MD of need for Rx for One Touch Ultra Mini strips and lancets.   Recent physical activity: Reports he continues previous exercise.  Progress Towards Goal(s):  N/A   Nutritional Diagnosis:  None    Intervention:  N/A  Handouts given during visit include:  Target Blood Glucose Levels  Monitoring/Evaluation:  Dietary intake, exercise, A1c, BG trends, and body weight in 3 month(s).   Samples dispensed at visit:  Celebrate Vitamins:   Sublingual B-12: 5 ea - Lot # [id number]; Exp: 07/14  Vit D3 5000 Quickmelt: 10 ea - Lot # [id number]; Exp: 01/15 Carnation Breakfast Essentials (NAS): 5 pkts - Lot # [id number]; Exp: 11/18/12 Boost Calorie Smart: 2 ea - Lot # [id number]; Exp: 08/01/12 OneTouch Ultra Mini starter kit (green): 1 kit - Lot # [id number]; Exp: 05/2012

## 2014-04-07 ENCOUNTER — Emergency Department (HOSPITAL_COMMUNITY): Payer: Medicare Other

## 2014-04-07 ENCOUNTER — Encounter (HOSPITAL_COMMUNITY): Payer: Self-pay | Admitting: Emergency Medicine

## 2014-04-07 ENCOUNTER — Emergency Department (HOSPITAL_COMMUNITY)
Admission: EM | Admit: 2014-04-07 | Discharge: 2014-04-07 | Disposition: A | Discharge status: Home / Self Care | Payer: Medicare Other | Attending: Emergency Medicine | Admitting: Emergency Medicine

## 2014-04-07 DIAGNOSIS — E876 Hypokalemia: Secondary | ICD-10-CM | POA: Diagnosis not present

## 2014-04-07 DIAGNOSIS — I10 Essential (primary) hypertension: Secondary | ICD-10-CM | POA: Insufficient documentation

## 2014-04-07 DIAGNOSIS — E119 Type 2 diabetes mellitus without complications: Secondary | ICD-10-CM | POA: Diagnosis not present

## 2014-04-07 DIAGNOSIS — Z79899 Other long term (current) drug therapy: Secondary | ICD-10-CM | POA: Insufficient documentation

## 2014-04-07 DIAGNOSIS — J449 Chronic obstructive pulmonary disease, unspecified: Secondary | ICD-10-CM | POA: Diagnosis not present

## 2014-04-07 DIAGNOSIS — R1031 Right lower quadrant pain: Secondary | ICD-10-CM | POA: Diagnosis present

## 2014-04-07 DIAGNOSIS — E785 Hyperlipidemia, unspecified: Secondary | ICD-10-CM | POA: Insufficient documentation

## 2014-04-07 DIAGNOSIS — E662 Morbid (severe) obesity with alveolar hypoventilation: Secondary | ICD-10-CM | POA: Insufficient documentation

## 2014-04-07 DIAGNOSIS — Z8739 Personal history of other diseases of the musculoskeletal system and connective tissue: Secondary | ICD-10-CM | POA: Diagnosis not present

## 2014-04-07 DIAGNOSIS — I2510 Atherosclerotic heart disease of native coronary artery without angina pectoris: Secondary | ICD-10-CM | POA: Diagnosis not present

## 2014-04-07 DIAGNOSIS — Z7982 Long term (current) use of aspirin: Secondary | ICD-10-CM | POA: Insufficient documentation

## 2014-04-07 DIAGNOSIS — K529 Noninfective gastroenteritis and colitis, unspecified: Secondary | ICD-10-CM

## 2014-04-07 DIAGNOSIS — I252 Old myocardial infarction: Secondary | ICD-10-CM | POA: Diagnosis not present

## 2014-04-07 DIAGNOSIS — K5289 Other specified noninfective gastroenteritis and colitis: Secondary | ICD-10-CM | POA: Diagnosis not present

## 2014-04-07 DIAGNOSIS — Z8679 Personal history of other diseases of the circulatory system: Secondary | ICD-10-CM | POA: Insufficient documentation

## 2014-04-07 DIAGNOSIS — Z951 Presence of aortocoronary bypass graft: Secondary | ICD-10-CM | POA: Insufficient documentation

## 2014-04-07 DIAGNOSIS — I4891 Unspecified atrial fibrillation: Secondary | ICD-10-CM | POA: Diagnosis not present

## 2014-04-07 DIAGNOSIS — J4489 Other specified chronic obstructive pulmonary disease: Secondary | ICD-10-CM | POA: Insufficient documentation

## 2014-04-07 LAB — COMPREHENSIVE METABOLIC PANEL
ALBUMIN: 3.1 g/dL — AB (ref 3.5–5.2)
ALK PHOS: 57 U/L (ref 39–117)
ALT: 23 U/L (ref 0–53)
AST: 19 U/L (ref 0–37)
Anion gap: 14 (ref 5–15)
BUN: 16 mg/dL (ref 6–23)
CO2: 27 mEq/L (ref 19–32)
Calcium: 8.8 mg/dL (ref 8.4–10.5)
Chloride: 96 mEq/L (ref 96–112)
Creatinine, Ser: 1.38 mg/dL — ABNORMAL HIGH (ref 0.50–1.35)
GFR calc Af Amer: 56 mL/min — ABNORMAL LOW (ref >90)
GFR calc non Af Amer: 48 mL/min — ABNORMAL LOW (ref >90)
Glucose, Bld: 131 mg/dL — ABNORMAL HIGH (ref 70–99)
POTASSIUM: 2.9 meq/L — AB (ref 3.7–5.3)
SODIUM: 137 meq/L (ref 137–147)
TOTAL PROTEIN: 6.1 g/dL (ref 6.0–8.3)
Total Bilirubin: 0.6 mg/dL (ref 0.3–1.2)

## 2014-04-07 LAB — URINALYSIS, ROUTINE W REFLEX MICROSCOPIC
BILIRUBIN URINE: NEGATIVE
GLUCOSE, UA: NEGATIVE mg/dL
HGB URINE DIPSTICK: NEGATIVE
Ketones, ur: NEGATIVE mg/dL
LEUKOCYTES UA: NEGATIVE
Nitrite: NEGATIVE
PH: 6.5 (ref 5.0–8.0)
PROTEIN: NEGATIVE mg/dL
Specific Gravity, Urine: 1.026 (ref 1.005–1.030)
Urobilinogen, UA: 0.2 mg/dL (ref 0.0–1.0)

## 2014-04-07 LAB — CBC WITH DIFFERENTIAL/PLATELET
BASOS PCT: 0 % (ref 0–1)
Basophils Absolute: 0 10*3/uL (ref 0.0–0.1)
EOS ABS: 0.2 10*3/uL (ref 0.0–0.7)
Eosinophils Relative: 1 % (ref 0–5)
HCT: 48.7 % (ref 39.0–52.0)
Hemoglobin: 16.8 g/dL (ref 13.0–17.0)
Lymphocytes Relative: 13 % (ref 12–46)
Lymphs Abs: 1.5 10*3/uL (ref 0.7–4.0)
MCH: 29.7 pg (ref 26.0–34.0)
MCHC: 34.5 g/dL (ref 30.0–36.0)
MCV: 86 fL (ref 78.0–100.0)
Monocytes Absolute: 1 10*3/uL (ref 0.1–1.0)
Monocytes Relative: 9 % (ref 3–12)
NEUTROS PCT: 77 % (ref 43–77)
Neutro Abs: 8.8 10*3/uL — ABNORMAL HIGH (ref 1.7–7.7)
PLATELETS: 189 10*3/uL (ref 150–400)
RBC: 5.66 MIL/uL (ref 4.22–5.81)
RDW: 15.1 % (ref 11.5–15.5)
WBC: 11.5 10*3/uL — ABNORMAL HIGH (ref 4.0–10.5)

## 2014-04-07 LAB — LIPASE, BLOOD: Lipase: 20 U/L (ref 11–59)

## 2014-04-07 LAB — POC OCCULT BLOOD, ED: Fecal Occult Bld: POSITIVE — AB

## 2014-04-07 MED ORDER — METRONIDAZOLE 500 MG PO TABS
500.0000 mg | ORAL_TABLET | Freq: Two times a day (BID) | ORAL | Status: DC
Start: 2014-04-07 — End: 2014-06-25

## 2014-04-07 MED ORDER — METRONIDAZOLE 500 MG PO TABS
500.0000 mg | ORAL_TABLET | Freq: Once | ORAL | Status: AC
  Administered 2014-04-07: 500 mg via ORAL
  Filled 2014-04-07: qty 1

## 2014-04-07 MED ORDER — CIPROFLOXACIN HCL 500 MG PO TABS
500.0000 mg | ORAL_TABLET | Freq: Once | ORAL | Status: AC
Start: 2014-04-07 — End: 2014-04-07
  Administered 2014-04-07: 500 mg via ORAL
  Filled 2014-04-07: qty 1

## 2014-04-07 MED ORDER — POTASSIUM CHLORIDE CRYS ER 20 MEQ PO TBCR
40.0000 meq | EXTENDED_RELEASE_TABLET | Freq: Once | ORAL | Status: AC
  Administered 2014-04-07: 40 meq via ORAL
  Filled 2014-04-07: qty 2

## 2014-04-07 MED ORDER — IOHEXOL 300 MG/ML  SOLN
100.0000 mL | Freq: Once | INTRAMUSCULAR | Status: AC | PRN
  Administered 2014-04-07: 80 mL via INTRAVENOUS

## 2014-04-07 MED ORDER — CIPROFLOXACIN HCL 500 MG PO TABS: 500.0000 mg | ORAL_TABLET | Freq: Two times a day (BID) | ORAL | Status: DC

## 2014-04-07 MED ORDER — MORPHINE SULFATE 4 MG/ML IJ SOLN
6.0000 mg | Freq: Once | INTRAMUSCULAR | Status: AC
  Administered 2014-04-07: 6 mg via INTRAVENOUS
  Filled 2014-04-07: qty 2

## 2014-04-07 MED ORDER — IOHEXOL 300 MG/ML  SOLN
25.0000 mL | Freq: Once | INTRAMUSCULAR | Status: AC | PRN
  Administered 2014-04-07: 25 mL via ORAL

## 2014-04-07 NOTE — Discharge Instructions (Signed)

## 2014-04-07 NOTE — ED Notes (Signed)
Dr. Sunny Schlein notified of critical potassium of 2.9.

## 2014-04-07 NOTE — ED Provider Notes (Signed)
CSN: [id number]     Arrival date & time 04/07/14  1509 History   First MD Initiated Contact with Patient 04/07/14 1510     Chief Complaint  Patient presents with  . Abdominal Pain  . Rectal Bleeding     (Consider location/radiation/quality/duration/timing/severity/associated sxs/prior Treatment) Patient is a 78 y.o. male presenting with abdominal pain. The history is provided by the patient.  Abdominal Pain Pain location:  RLQ Pain quality: aching, gnawing and stabbing   Pain radiates to:  Does not radiate Pain severity:  Mild Onset quality:  Gradual Duration:  3 days Timing:  Constant Progression:  Waxing and waning Chronicity:  New (hx of diverticulitis and states feels like similar in past) Context: eating (eating may have made it worse)   Context: not previous surgeries and not trauma   Relieved by:  Nothing Worsened by:  Nothing tried Ineffective treatments:  None tried Associated symptoms: diarrhea and hematochezia   Associated symptoms: no anorexia, no chest pain, no constipation, no cough, no dysuria, no fever, no flatus, no hematuria, no nausea, no shortness of breath and no vomiting   Risk factors: being elderly and obesity   Risk factors: no alcohol abuse and has not had multiple surgeries     Past Medical History  Diagnosis Date  . Type II or unspecified type diabetes mellitus without mention of complication, not stated as uncontrolled   . Obesity, unspecified   . Unspecified essential hypertension   . Coronary atherosclerosis of unspecified type of vessel, native or graft   . Vestibulopathy   . Hypokalemia   . Atrial fibrillation   . Rhabdomyolysis   . Hypercholesteremia   . COPD (chronic obstructive pulmonary disease)   . OSA (obstructive sleep apnea)   . Anteroseptal myocardial infarction   . Obesity hypoventilation syndrome    Past Surgical History  Procedure Laterality Date  . Inguinal hernia repair  1986  . Coronary artery bypass graft  1997   PCI to LAD  . Nose surgery  1970s    Per Dr. Terrence Dupont [facility] in pt chart  . Knee surgery  1991    Left Knee  . Lumbar fusion     Family History  Problem Relation Age of Onset  . Coronary artery disease Other   . COPD Mother   . Diabetes Father   . Diabetes Sister   . Heart disease Brother   . Cancer Maternal Aunt   . Diabetes Paternal Grandmother   . Cancer Maternal Aunt     Breast   History  Substance Use Topics  . Smoking status: Never Smoker   . Smokeless tobacco: Not on file  . Alcohol Use: No    Review of Systems  Constitutional: Negative for fever, activity change and appetite change.  HENT: Negative for congestion and rhinorrhea.   Eyes: Negative for discharge and itching.  Respiratory: Negative for cough, shortness of breath and wheezing.   Cardiovascular: Negative for chest pain.  Gastrointestinal: Positive for abdominal pain, diarrhea, blood in stool and hematochezia. Negative for nausea, vomiting, constipation, anorexia and flatus.  Genitourinary: Negative for dysuria, hematuria, decreased urine volume and difficulty urinating.  Skin: Negative for rash and wound.  Neurological: Negative for syncope, weakness and numbness.  All other systems reviewed and are negative.     Allergies  Review of patient's allergies indicates no known allergies.  Home Medications   Prior to Admission medications   Medication Sig Start Date End Date Taking? Authorizing Provider  amiodarone (PACERONE) 200  MG tablet Take 200 mg by mouth daily.     Yes Historical Provider, MD  aspirin 81 MG chewable tablet Chew 81 mg by mouth daily.     Yes Historical Provider, MD  atorvastatin (LIPITOR) 20 MG tablet Take 20 mg by mouth daily at 6 PM.    Yes Historical Provider, MD  clopidogrel (PLAVIX) 75 MG tablet Take 75 mg by mouth daily.     Yes Historical Provider, MD  glimepiride (AMARYL) 2 MG tablet Take 2 mg by mouth daily. 01/07/14  Yes Historical Provider, MD  latanoprost (XALATAN) 0.005 %  ophthalmic solution Place 1 drop into both eyes at bedtime. 02/10/14  Yes Historical Provider, MD  Multiple Vitamin (MULTIVITAMIN WITH MINERALS) TABS tablet Take 1 tablet by mouth daily.   Yes Historical Provider, MD  potassium chloride SA (K-DUR,KLOR-CON) 20 MEQ tablet Take 20 mEq by mouth 3 (three) times daily.     Yes Historical Provider, MD  telmisartan-hydrochlorothiazide (MICARDIS HCT) 80-25 MG per tablet Take 1 tablet by mouth daily.     Yes Historical Provider, MD  Testosterone (ANDROGEL PUMP) 1.25 GM/ACT (1%) GEL Place 5 sprays onto the skin daily as needed (only takes sometimes). 5 daily   Yes Historical Provider, MD  ciprofloxacin (CIPRO) 500 MG tablet Take 1 tablet (500 mg total) by mouth 2 (two) times daily. 04/07/14   Sol Passer, MD  metroNIDAZOLE (FLAGYL) 500 MG tablet Take 1 tablet (500 mg total) by mouth 2 (two) times daily. 04/07/14   Sol Passer, MD   BP 137/74  Pulse 81  Temp(Src) 98.5 F (36.9 C) (Oral)  Resp 18  SpO2 94% Physical Exam  Vitals reviewed. Constitutional: He is oriented to person, place, and time. He appears well-developed and well-nourished. No distress.  HENT:  Head: Normocephalic and atraumatic.  Mouth/Throat: Oropharynx is clear and moist. No oropharyngeal exudate.  Eyes: Conjunctivae and EOM are normal. Pupils are equal, round, and reactive to light. Right eye exhibits no discharge. Left eye exhibits no discharge. No scleral icterus.  Neck: Normal range of motion. Neck supple.  Cardiovascular: Normal rate, regular rhythm, normal heart sounds and intact distal pulses.  Exam reveals no gallop and no friction rub.   No murmur heard. Pulmonary/Chest: Effort normal and breath sounds normal. No respiratory distress. He has no wheezes. He has no rales.  Abdominal: Soft. He exhibits no distension and no mass. There is tenderness (mild tttp RLQ, no peritonitis, soft).  Genitourinary:  Anal: nomass, hemorrhoid, or fissure. No pain with exam. Smalla mount of  light red stool in vault  Musculoskeletal: Normal range of motion.  Neurological: He is alert and oriented to person, place, and time. No cranial nerve deficit. He exhibits normal muscle tone. Coordination normal.  Skin: Skin is warm. No rash noted. He is not diaphoretic.    ED Course  Procedures (including critical care time) Labs Review Labs Reviewed  CBC WITH DIFFERENTIAL - Abnormal; Notable for the following:    WBC 11.5 (*)    Neutro Abs 8.8 (*)    All other components within normal limits  COMPREHENSIVE METABOLIC PANEL - Abnormal; Notable for the following:    Potassium 2.9 (*)    Glucose, Bld 131 (*)    Creatinine, Ser 1.38 (*)    Albumin 3.1 (*)    GFR calc non Af Amer 48 (*)    GFR calc Af Amer 56 (*)    All other components within normal limits  POC OCCULT BLOOD, ED - Abnormal; Notable  for the following:    Fecal Occult Bld POSITIVE (*)    All other components within normal limits  LIPASE, BLOOD  URINALYSIS, ROUTINE W REFLEX MICROSCOPIC    Imaging Review Ct Abdomen Pelvis W Contrast  04/07/2014   CLINICAL DATA:  Right and left-sided abdominal pain and rectal bleeding. Diarrhea. History of diverticulitis.  EXAM: CT ABDOMEN AND PELVIS WITH CONTRAST  TECHNIQUE: Multidetector CT imaging of the abdomen and pelvis was performed using the standard protocol following bolus administration of intravenous contrast.  CONTRAST:  45mL OMNIPAQUE IOHEXOL 300 MG/ML  SOLN  COMPARISON:  05/13/2010  FINDINGS: Mild scarring is present in the lung bases. Mitral annular calcification is noted.  The gallbladder is surgically absent. No biliary dilatation is seen. The liver, spleen, adrenal glands, kidneys, and pancreas have an unremarkable enhanced appearance.  There is diffuse, moderate wall thickening involving the ascending colon, hepatic flexure, and proximal half of the transverse colon. There is surrounding fat stranding and a small amount of free fluid in the right pericolic gutter. No  diverticula are seen. Oral contrast is present in nondilated loops of small bowel without evidence of obstruction.  There is moderate aortoiliac atherosclerosis. No enlarged lymph nodes are identified. Mild body wall edema is present. Prior single level lumbar fusion is noted. Advanced lower lumbar facet arthrosis is noted.  IMPRESSION: Diffuse wall thickening involving the proximal colon, consistent with colitis which could be infectious or ischemic in etiology.   Electronically Signed   By: Logan Bores   On: 04/07/2014 17:57     EKG Interpretation   Date/Time:  Monday April 07 2014 15:15:42 EDT Ventricular Rate:  97 PR Interval:    QRS Duration: 88 QT Interval:  395 QTC Calculation: 502 R Axis:   63 Text Interpretation:  Age not entered, assumed to be  78 years old for  purpose of ECG interpretation Atrial fibrillation Ventricular premature  complex Anterior infarct, old Borderline ST depression, anterolateral  leads Prolonged QT interval Baseline wander in lead(s) V5 nonspecific  changes not significantly changed from 2012 Confirmed by GOLDSTON  MD,  [place] ([id number]) on 04/07/2014 3:28:41 PM      MDM   MDM: 13 .y.o WM w/ PMHx of DM, CAD, w/ cc: of abd pain, BRBPR. 3 days of sxs, diarrhea 8-10 x per day, blood tinged. No pain with defecation. No vomiting. No fever. Noc hest pain, SOB. Here AFVSS, well appearing, NAD. Abd soft, mild RLQ ttp. No peritonitis. Rectal exam with light red stool, no hemorrhoid, fissure, mass. Labs with good H/H. K low, hx of hypoK in past, takes 3 pills a day. CT scan shows colitis. Favored to be infectious, as not c/w ischemic over past 3 days, normal vitals, not peritonitic. Repleted K. D/w patient regarding Inpt vs outpt treatment, including risks benefits. With shared decision making with him and family, he elected for outpt tx. He understood risks and benefits, and states he has good follow up with his PCP who he will call tomororw AM. He will follow up in ED  if worsening pain, vomiting, fever. I instructed him to take an additional K supplement, and return as needed. Will give rx for Cipro and Flagyl. Discharged.   Final diagnoses:  Colitis    Discharge Medication List as of 04/07/2014  6:31 PM    START taking these medications   Details  ciprofloxacin (CIPRO) 500 MG tablet Take 1 tablet (500 mg total) by mouth 2 (two) times daily., Starting 04/07/2014, Until Discontinued, Print  metroNIDAZOLE (FLAGYL) 500 MG tablet Take 1 tablet (500 mg total) by mouth 2 (two) times daily., Starting 04/07/2014, Until Discontinued, Print       Clent Demark, MD [phone] [address] [place] [address] [phone]  Schedule an appointment as soon as possible for a visit in 3 days   [place] [address] [id number] [place] [address] [phone]  As needed, If symptoms worsen  Discharged  Sol Passer, MD 04/07/14 2318

## 2014-04-07 NOTE — ED Notes (Signed)
CT notified of pt finishing contrast. 

## 2014-04-07 NOTE — ED Notes (Signed)
Pt from home with right and left sided abdominal pain and rectal bleeding.  Pt sts abdominal pain that started 3 days ago when he ate some ham.  Pt with episode of diarrhea last night that was dark and tarry and 1 episode of rectal bleeding today.  Pt with hx of diverticulitis and sts pain feels similar in nature.  Pt's pain currently 8/10.

## 2014-04-08 NOTE — ED Provider Notes (Signed)
I saw and evaluated the patient, reviewed the resident's note and I agree with the findings and plan.   EKG Interpretation   Date/Time:  Monday April 07 2014 15:15:42 EDT Ventricular Rate:  97 PR Interval:    QRS Duration: 88 QT Interval:  395 QTC Calculation: 502 R Axis:   63 Text Interpretation:  Age not entered, assumed to be  78 years old for  purpose of ECG interpretation Atrial fibrillation Ventricular premature  complex Anterior infarct, old Borderline ST depression, anterolateral  leads Prolonged QT interval Baseline wander in lead(s) V5 nonspecific  changes not significantly changed from 2012 Confirmed by Kabella Cassidy  MD,  [place] ([id number]) on 04/07/2014 3:28:41 PM       Patient with colitis. His pain is controlled in the ED and he has no fevers or peritoneal signs. He prefers to try antibiotics as an outpatient first. Given his well appearance I feel this is reasonable. He has a normal hemoglobin with some rectal bleeding, likely from the colitis. At this point is not necessarily need observation given his good hemoglobin.   Ephraim Hamburger, MD 04/08/14 [phone]

## 2014-04-24 ENCOUNTER — Encounter: Payer: Self-pay | Admitting: Internal Medicine

## 2014-06-25 ENCOUNTER — Ambulatory Visit: Payer: Medicare Other | Admitting: Gastroenterology

## 2014-06-25 ENCOUNTER — Ambulatory Visit (INDEPENDENT_AMBULATORY_CARE_PROVIDER_SITE_OTHER): Payer: Medicare Other | Admitting: Internal Medicine

## 2014-06-25 ENCOUNTER — Encounter: Payer: Self-pay | Admitting: Internal Medicine

## 2014-06-25 VITALS — BP 130/80 | HR 84 | Ht 69.0 in | Wt 311.4 lb

## 2014-06-25 DIAGNOSIS — R1031 Right lower quadrant pain: Secondary | ICD-10-CM

## 2014-06-25 DIAGNOSIS — R935 Abnormal findings on diagnostic imaging of other abdominal regions, including retroperitoneum: Secondary | ICD-10-CM

## 2014-06-25 DIAGNOSIS — K625 Hemorrhage of anus and rectum: Secondary | ICD-10-CM

## 2014-06-25 NOTE — Patient Instructions (Signed)
Please follow up with Dr. Perry as needed 

## 2014-06-25 NOTE — Progress Notes (Signed)
HISTORY OF PRESENT ILLNESS:  Reginald [name] is a 78 y.o. male with MULTIPLE COMPLICATED medical problems as listed below. He is sent today by his cardiologist regarding problems with abdominal pain and rectal bleeding. The patient is new to this practice but reports having had a colonoscopy approximately 10 years ago with Dr. Jim Desanctis. He denies any abnormalities. Patient was in his usual state of suboptimal health when he developed acute right-sided abdominal pain late September 2015. This was followed by the passage of somewhat loose bloody stool. He was evaluated in the emergency room. CBC was unremarkable. CT scan of the abdomen and pelvis revealed right-sided colitis. He was given a shot of morphine with resolution of pain. He was also sent home on antibiotics which she completed. Within a few days his symptoms completely resolved. Since then he has had no further pain or bleeding. He is now on Xarelto for recently discovered atrial fibrillation. He is accompanied today by his friend. His only GI complaint is increased intestinal gas and bloating.  REVIEW OF SYSTEMS:  All non-GI ROS negative except for fatigue, shortness of breath, leg swelling, increased urination, muscle pains, hearing problems  Past Medical History  Diagnosis Date  . Type II or unspecified type diabetes mellitus without mention of complication, not stated as uncontrolled   . Obesity, unspecified   . Unspecified essential hypertension   . Coronary atherosclerosis of unspecified type of vessel, native or graft   . Vestibulopathy   . Hypokalemia   . Atrial fibrillation   . Rhabdomyolysis   . Hypercholesteremia   . COPD (chronic obstructive pulmonary disease)   . OSA (obstructive sleep apnea)   . Anteroseptal myocardial infarction   . Obesity hypoventilation syndrome   . Hypertensive renal disease   . Chronic kidney disease   . CHF (congestive heart failure)   . Diverticulosis   . Glaucoma     Past Surgical  History  Procedure Laterality Date  . Inguinal hernia repair Right 1985  . Coronary artery bypass graft  1997    PCI to LAD  . Nose surgery  1970s    Per Dr. Terrence Dupont [facility] in pt chart  . Knee arthroscopy Left 1991  . Lumbar fusion    . Appendectomy  1985  . Cholecystectomy  06/2000    Social History Reginald [name]  reports that he has never smoked. He has never used smokeless tobacco. He reports that he does not drink alcohol or use illicit drugs.  family history includes Breast cancer in his maternal aunt; COPD in his mother; Colon cancer in his maternal aunt; Diabetes in his father, paternal grandmother, and sister; Heart disease in his brother; Ovarian cancer in his daughter.  No Known Allergies     PHYSICAL EXAMINATION: Vital signs: BP 130/80 mmHg  Pulse 84  Ht 5\' 9"  (1.753 m)  Wt 311 lb 6 oz (141.239 kg)  BMI 45.96 kg/m2  Constitutional: Obese generally unhealthy, no acute distress Psychiatric: alert and oriented x3, cooperative Eyes: extraocular movements intact, anicteric, conjunctiva pink Mouth: oral pharynx moist, no lesions Neck: supple no lymphadenopathy Cardiovascular: Irregularly irregular, no obvious murmur Lungs: clear to auscultation bilaterally Abdomen: soft, markedly obese, nontender, nondistended, no obvious ascites, no peritoneal signs, normal bowel sounds, no organomegaly Rectal: Omitted Extremities: Chronic stasis changes with skin breakdown and woody edema bilaterally Skin: no lesions on visible extremities Neuro: No focal deficits. Tremor upper extremities.    ASSESSMENT:  #27. 78 year old gentleman with multiple advanced medical problems who  is sent today after an episode of abdominal pain and rectal bleeding 3 months ago. CT scan demonstrating right sided colitis. This was either ischemic colitis or infectious colitis. Fortunately, he has had no problems since the acute event. No indication for colonoscopy. He will return to the care of his  primary providers for ongoing management of his multiple significant medical problems.

## 2014-07-15 DIAGNOSIS — E119 Type 2 diabetes mellitus without complications: Secondary | ICD-10-CM | POA: Diagnosis not present

## 2014-07-15 DIAGNOSIS — I10 Essential (primary) hypertension: Secondary | ICD-10-CM | POA: Diagnosis not present

## 2014-07-15 DIAGNOSIS — I482 Chronic atrial fibrillation: Secondary | ICD-10-CM | POA: Diagnosis not present

## 2014-07-15 DIAGNOSIS — E785 Hyperlipidemia, unspecified: Secondary | ICD-10-CM | POA: Diagnosis not present

## 2014-07-15 DIAGNOSIS — N189 Chronic kidney disease, unspecified: Secondary | ICD-10-CM | POA: Diagnosis not present

## 2014-07-15 DIAGNOSIS — I2510 Atherosclerotic heart disease of native coronary artery without angina pectoris: Secondary | ICD-10-CM | POA: Diagnosis not present

## 2014-07-17 DIAGNOSIS — D539 Nutritional anemia, unspecified: Secondary | ICD-10-CM | POA: Diagnosis not present

## 2014-07-17 DIAGNOSIS — E291 Testicular hypofunction: Secondary | ICD-10-CM | POA: Diagnosis not present

## 2014-07-17 DIAGNOSIS — D649 Anemia, unspecified: Secondary | ICD-10-CM | POA: Diagnosis not present

## 2014-07-17 DIAGNOSIS — E782 Mixed hyperlipidemia: Secondary | ICD-10-CM | POA: Diagnosis not present

## 2014-07-17 DIAGNOSIS — R5383 Other fatigue: Secondary | ICD-10-CM | POA: Diagnosis not present

## 2014-07-17 DIAGNOSIS — E559 Vitamin D deficiency, unspecified: Secondary | ICD-10-CM | POA: Diagnosis not present

## 2014-07-17 DIAGNOSIS — M129 Arthropathy, unspecified: Secondary | ICD-10-CM | POA: Diagnosis not present

## 2014-07-17 DIAGNOSIS — E039 Hypothyroidism, unspecified: Secondary | ICD-10-CM | POA: Diagnosis not present

## 2014-07-17 DIAGNOSIS — E119 Type 2 diabetes mellitus without complications: Secondary | ICD-10-CM | POA: Diagnosis not present

## 2014-07-17 DIAGNOSIS — Z79899 Other long term (current) drug therapy: Secondary | ICD-10-CM | POA: Diagnosis not present

## 2014-07-30 DIAGNOSIS — M1712 Unilateral primary osteoarthritis, left knee: Secondary | ICD-10-CM | POA: Diagnosis not present

## 2014-09-03 DIAGNOSIS — M1712 Unilateral primary osteoarthritis, left knee: Secondary | ICD-10-CM | POA: Diagnosis not present

## 2014-09-10 DIAGNOSIS — M1712 Unilateral primary osteoarthritis, left knee: Secondary | ICD-10-CM | POA: Diagnosis not present

## 2014-09-17 DIAGNOSIS — M1712 Unilateral primary osteoarthritis, left knee: Secondary | ICD-10-CM | POA: Diagnosis not present

## 2014-09-18 DIAGNOSIS — I10 Essential (primary) hypertension: Secondary | ICD-10-CM | POA: Diagnosis not present

## 2014-09-18 DIAGNOSIS — G4733 Obstructive sleep apnea (adult) (pediatric): Secondary | ICD-10-CM | POA: Diagnosis not present

## 2014-09-18 DIAGNOSIS — E119 Type 2 diabetes mellitus without complications: Secondary | ICD-10-CM | POA: Diagnosis not present

## 2014-09-26 DIAGNOSIS — E118 Type 2 diabetes mellitus with unspecified complications: Secondary | ICD-10-CM | POA: Diagnosis not present

## 2014-09-30 DIAGNOSIS — E118 Type 2 diabetes mellitus with unspecified complications: Secondary | ICD-10-CM | POA: Diagnosis not present

## 2014-10-07 DIAGNOSIS — I10 Essential (primary) hypertension: Secondary | ICD-10-CM | POA: Diagnosis not present

## 2014-10-07 DIAGNOSIS — E875 Hyperkalemia: Secondary | ICD-10-CM | POA: Diagnosis not present

## 2014-10-07 DIAGNOSIS — E118 Type 2 diabetes mellitus with unspecified complications: Secondary | ICD-10-CM | POA: Diagnosis not present

## 2014-10-08 DIAGNOSIS — H2513 Age-related nuclear cataract, bilateral: Secondary | ICD-10-CM | POA: Diagnosis not present

## 2014-10-13 DIAGNOSIS — H34831 Tributary (branch) retinal vein occlusion, right eye: Secondary | ICD-10-CM | POA: Diagnosis not present

## 2014-10-14 DIAGNOSIS — G4733 Obstructive sleep apnea (adult) (pediatric): Secondary | ICD-10-CM | POA: Diagnosis not present

## 2014-10-14 DIAGNOSIS — E669 Obesity, unspecified: Secondary | ICD-10-CM | POA: Diagnosis not present

## 2014-10-14 DIAGNOSIS — I482 Chronic atrial fibrillation: Secondary | ICD-10-CM | POA: Diagnosis not present

## 2014-10-14 DIAGNOSIS — I2510 Atherosclerotic heart disease of native coronary artery without angina pectoris: Secondary | ICD-10-CM | POA: Diagnosis not present

## 2014-10-14 DIAGNOSIS — I10 Essential (primary) hypertension: Secondary | ICD-10-CM | POA: Diagnosis not present

## 2014-10-14 DIAGNOSIS — E785 Hyperlipidemia, unspecified: Secondary | ICD-10-CM | POA: Diagnosis not present

## 2014-10-14 DIAGNOSIS — N189 Chronic kidney disease, unspecified: Secondary | ICD-10-CM | POA: Diagnosis not present

## 2014-10-14 DIAGNOSIS — E119 Type 2 diabetes mellitus without complications: Secondary | ICD-10-CM | POA: Diagnosis not present

## 2014-10-21 DIAGNOSIS — E559 Vitamin D deficiency, unspecified: Secondary | ICD-10-CM | POA: Diagnosis not present

## 2014-10-21 DIAGNOSIS — D539 Nutritional anemia, unspecified: Secondary | ICD-10-CM | POA: Diagnosis not present

## 2014-10-21 DIAGNOSIS — E119 Type 2 diabetes mellitus without complications: Secondary | ICD-10-CM | POA: Diagnosis not present

## 2014-10-21 DIAGNOSIS — R5383 Other fatigue: Secondary | ICD-10-CM | POA: Diagnosis not present

## 2014-10-21 DIAGNOSIS — M129 Arthropathy, unspecified: Secondary | ICD-10-CM | POA: Diagnosis not present

## 2014-10-21 DIAGNOSIS — E780 Pure hypercholesterolemia: Secondary | ICD-10-CM | POA: Diagnosis not present

## 2014-10-21 DIAGNOSIS — D649 Anemia, unspecified: Secondary | ICD-10-CM | POA: Diagnosis not present

## 2014-10-28 DIAGNOSIS — E119 Type 2 diabetes mellitus without complications: Secondary | ICD-10-CM | POA: Diagnosis not present

## 2014-10-28 DIAGNOSIS — L03116 Cellulitis of left lower limb: Secondary | ICD-10-CM | POA: Diagnosis not present

## 2014-10-28 DIAGNOSIS — I10 Essential (primary) hypertension: Secondary | ICD-10-CM | POA: Diagnosis not present

## 2014-10-28 DIAGNOSIS — E039 Hypothyroidism, unspecified: Secondary | ICD-10-CM | POA: Diagnosis not present

## 2014-10-31 ENCOUNTER — Encounter (HOSPITAL_COMMUNITY): Payer: Self-pay | Admitting: Physical Medicine and Rehabilitation

## 2014-10-31 ENCOUNTER — Emergency Department (HOSPITAL_COMMUNITY)
Admission: EM | Admit: 2014-10-31 | Discharge: 2014-10-31 | Disposition: A | Discharge status: Home / Self Care | Payer: Medicare Other | Attending: Emergency Medicine | Admitting: Emergency Medicine

## 2014-10-31 DIAGNOSIS — N189 Chronic kidney disease, unspecified: Secondary | ICD-10-CM | POA: Diagnosis not present

## 2014-10-31 DIAGNOSIS — Z79899 Other long term (current) drug therapy: Secondary | ICD-10-CM | POA: Diagnosis not present

## 2014-10-31 DIAGNOSIS — I2510 Atherosclerotic heart disease of native coronary artery without angina pectoris: Secondary | ICD-10-CM | POA: Diagnosis not present

## 2014-10-31 DIAGNOSIS — E780 Pure hypercholesterolemia: Secondary | ICD-10-CM | POA: Diagnosis not present

## 2014-10-31 DIAGNOSIS — H409 Unspecified glaucoma: Secondary | ICD-10-CM | POA: Insufficient documentation

## 2014-10-31 DIAGNOSIS — L03116 Cellulitis of left lower limb: Secondary | ICD-10-CM

## 2014-10-31 DIAGNOSIS — Z951 Presence of aortocoronary bypass graft: Secondary | ICD-10-CM | POA: Insufficient documentation

## 2014-10-31 DIAGNOSIS — Z8719 Personal history of other diseases of the digestive system: Secondary | ICD-10-CM | POA: Insufficient documentation

## 2014-10-31 DIAGNOSIS — M79605 Pain in left leg: Secondary | ICD-10-CM | POA: Diagnosis present

## 2014-10-31 DIAGNOSIS — I129 Hypertensive chronic kidney disease with stage 1 through stage 4 chronic kidney disease, or unspecified chronic kidney disease: Secondary | ICD-10-CM | POA: Diagnosis not present

## 2014-10-31 DIAGNOSIS — E119 Type 2 diabetes mellitus without complications: Secondary | ICD-10-CM | POA: Insufficient documentation

## 2014-10-31 DIAGNOSIS — E669 Obesity, unspecified: Secondary | ICD-10-CM | POA: Insufficient documentation

## 2014-10-31 DIAGNOSIS — Z8739 Personal history of other diseases of the musculoskeletal system and connective tissue: Secondary | ICD-10-CM | POA: Insufficient documentation

## 2014-10-31 DIAGNOSIS — J449 Chronic obstructive pulmonary disease, unspecified: Secondary | ICD-10-CM | POA: Diagnosis not present

## 2014-10-31 LAB — CBC WITH DIFFERENTIAL/PLATELET
Basophils Absolute: 0 10*3/uL (ref 0.0–0.1)
Basophils Relative: 1 % (ref 0–1)
EOS ABS: 0.2 10*3/uL (ref 0.0–0.7)
Eosinophils Relative: 2 % (ref 0–5)
HCT: 51.1 % (ref 39.0–52.0)
Hemoglobin: 17.2 g/dL — ABNORMAL HIGH (ref 13.0–17.0)
LYMPHS ABS: 1.6 10*3/uL (ref 0.7–4.0)
LYMPHS PCT: 19 % (ref 12–46)
MCH: 30 pg (ref 26.0–34.0)
MCHC: 33.7 g/dL (ref 30.0–36.0)
MCV: 89.2 fL (ref 78.0–100.0)
Monocytes Absolute: 0.8 10*3/uL (ref 0.1–1.0)
Monocytes Relative: 10 % (ref 3–12)
Neutro Abs: 5.9 10*3/uL (ref 1.7–7.7)
Neutrophils Relative %: 68 % (ref 43–77)
PLATELETS: 186 10*3/uL (ref 150–400)
RBC: 5.73 MIL/uL (ref 4.22–5.81)
RDW: 15.2 % (ref 11.5–15.5)
WBC: 8.5 10*3/uL (ref 4.0–10.5)

## 2014-10-31 LAB — COMPREHENSIVE METABOLIC PANEL
ALBUMIN: 3.5 g/dL (ref 3.5–5.2)
ALK PHOS: 62 U/L (ref 39–117)
ALT: 30 U/L (ref 0–53)
AST: 22 U/L (ref 0–37)
Anion gap: 9 (ref 5–15)
BILIRUBIN TOTAL: 1.1 mg/dL (ref 0.3–1.2)
BUN: 17 mg/dL (ref 6–23)
CO2: 29 mmol/L (ref 19–32)
Calcium: 9.3 mg/dL (ref 8.4–10.5)
Chloride: 105 mmol/L (ref 96–112)
Creatinine, Ser: 1.52 mg/dL — ABNORMAL HIGH (ref 0.50–1.35)
GFR calc Af Amer: 50 mL/min — ABNORMAL LOW (ref >90)
GFR calc non Af Amer: 43 mL/min — ABNORMAL LOW (ref >90)
Glucose, Bld: 117 mg/dL — ABNORMAL HIGH (ref 70–99)
Potassium: 3.7 mmol/L (ref 3.5–5.1)
Sodium: 143 mmol/L (ref 135–145)
Total Protein: 6 g/dL (ref 6.0–8.3)

## 2014-10-31 MED ORDER — ACIDOPHILUS PROBIOTIC 10 MG PO TABS: 10.0000 mg | ORAL_TABLET | Freq: Three times a day (TID) | ORAL | Status: DC

## 2014-10-31 MED ORDER — CLINDAMYCIN PHOSPHATE 600 MG/50ML IV SOLN
600.0000 mg | Freq: Once | INTRAVENOUS | Status: AC
  Administered 2014-10-31: 600 mg via INTRAVENOUS
  Filled 2014-10-31: qty 50

## 2014-10-31 MED ORDER — CLINDAMYCIN HCL 150 MG PO CAPS: 450.0000 mg | ORAL_CAPSULE | Freq: Three times a day (TID) | ORAL | Status: DC

## 2014-10-31 NOTE — ED Provider Notes (Signed)
CSN: [id number]     Arrival date & time 10/31/14  1130 History   First MD Initiated Contact with Patient 10/31/14 1319     Chief Complaint  Patient presents with  . Leg Pain   Reginald [name] is a 79 y.o. male with a history of atrial fibrillation, diabetes, hypertension, chronic kidney disease, CHF and cellulitis of his lower extremities to presents to the emergency department complaining of worsening cellulitis of his left lower leg. The patient reports that one week ago he accidentally hit his shin on a coffee table and developed worsening cellulitis of his left lower leg. He reports he has cellulitis chronically but sometimes has problems with this and needs to be on antibiotics. He reports he saw his primary care provider 4 days ago and was started on Keflex. Patient reports his cellulitis has not improved. Is complaining of 3 out of 10 pain today. The patient's primary care provider is at [facility] medical clinic. The patient is on xarelto for his atrial fibrillation. He denies skipping any doses of his Xarelto. Patient is taking nothing for his pain today. The patient denies fevers, chills, numbness, tingling, weakness, abdominal pain, nausea, or vomiting.   (Consider location/radiation/quality/duration/timing/severity/associated sxs/prior Treatment) HPI  Past Medical History  Diagnosis Date  . Type II or unspecified type diabetes mellitus without mention of complication, not stated as uncontrolled   . Obesity, unspecified   . Unspecified essential hypertension   . Coronary atherosclerosis of unspecified type of vessel, native or graft   . Vestibulopathy   . Hypokalemia   . Atrial fibrillation   . Rhabdomyolysis   . Hypercholesteremia   . COPD (chronic obstructive pulmonary disease)   . OSA (obstructive sleep apnea)   . Anteroseptal myocardial infarction   . Obesity hypoventilation syndrome   . Hypertensive renal disease   . Chronic kidney disease   . CHF (congestive heart failure)    . Diverticulosis   . Glaucoma    Past Surgical History  Procedure Laterality Date  . Inguinal hernia repair Right 1985  . Coronary artery bypass graft  1997    PCI to LAD  . Nose surgery  1970s    Per Dr. Terrence Dupont [facility] in pt chart  . Knee arthroscopy Left 1991  . Lumbar fusion    . Appendectomy  1985  . Cholecystectomy  06/2000   Family History  Problem Relation Age of Onset  . COPD Mother   . Diabetes Father   . Diabetes Sister   . Heart disease Brother   . Breast cancer Maternal Aunt   . Diabetes Paternal Grandmother   . Colon cancer Maternal Aunt   . Ovarian cancer Daughter    History  Substance Use Topics  . Smoking status: Never Smoker   . Smokeless tobacco: Never Used  . Alcohol Use: No    Review of Systems  Constitutional: Negative for fever and chills.  HENT: Negative for congestion and sore throat.   Eyes: Negative for visual disturbance.  Respiratory: Negative for cough, shortness of breath and wheezing.   Cardiovascular: Negative for chest pain and palpitations.  Gastrointestinal: Negative for nausea, vomiting, abdominal pain and diarrhea.  Genitourinary: Negative for dysuria.  Musculoskeletal: Negative for back pain and neck pain.  Skin: Positive for color change, rash and wound.  Neurological: Negative for light-headedness, numbness and headaches.      Allergies  Review of patient's allergies indicates no known allergies.  Home Medications   Prior to Admission medications  Medication Sig Start Date End Date Taking? Authorizing Provider  amiodarone (PACERONE) 200 MG tablet Take 200 mg by mouth daily.     Yes Historical Provider, MD  atorvastatin (LIPITOR) 20 MG tablet Take 20 mg by mouth daily at 6 PM.    Yes Historical Provider, MD  cholecalciferol (VITAMIN D) 1000 UNITS tablet Take 1,000 Units by mouth daily.   Yes Historical Provider, MD  furosemide (LASIX) 40 MG tablet Take 40 mg by mouth daily.   Yes Historical Provider, MD  glimepiride  (AMARYL) 2 MG tablet Take 2 mg by mouth daily. 01/07/14  Yes Historical Provider, MD  latanoprost (XALATAN) 0.005 % ophthalmic solution Place 1 drop into both eyes at bedtime. 02/10/14  Yes Historical Provider, MD  lisinopril (PRINIVIL,ZESTRIL) 40 MG tablet Take 40 mg by mouth 2 (two) times daily. 06/23/14  Yes Historical Provider, MD  nitroGLYCERIN (NITROSTAT) 0.4 MG SL tablet Place 0.4 mg under the tongue every 5 (five) minutes as needed for chest pain.   Yes Historical Provider, MD  potassium chloride SA (K-DUR,KLOR-CON) 20 MEQ tablet Take 20 mEq by mouth 3 (three) times daily.     Yes Historical Provider, MD  saxagliptin HCl (ONGLYZA) 5 MG TABS tablet Take 5 mg by mouth daily.   Yes Historical Provider, MD  telmisartan-hydrochlorothiazide (MICARDIS HCT) 80-25 MG per tablet Take 1 tablet by mouth daily.     Yes Historical Provider, MD  Testosterone (ANDROGEL PUMP) 1.25 GM/ACT (1%) GEL Place 5 sprays onto the skin daily as needed (only takes sometimes). 5 daily   Yes Historical Provider, MD  timolol (TIMOPTIC-XR) 0.5 % ophthalmic gel-forming Place 1 drop into both eyes at bedtime. 05/26/14  Yes Historical Provider, MD  XARELTO 15 MG TABS tablet Take 1 tablet by mouth daily. 06/17/14  Yes Historical Provider, MD  cephALEXin (KEFLEX) 500 MG capsule Take 500 mg by mouth 3 (three) times daily. 10/28/14   Historical Provider, MD  clindamycin (CLEOCIN) 150 MG capsule Take 3 capsules (450 mg total) by mouth 3 (three) times daily. May dispense as 150mg  capsules 10/31/14   Waynetta Pean, PA-C  Lactobacillus (ACIDOPHILUS PROBIOTIC) 10 MG TABS Take 10 mg by mouth 3 (three) times daily. 10/31/14   Waynetta Pean, PA-C   BP 142/63 mmHg  Pulse 70  Temp(Src) 98 F (36.7 C) (Oral)  Resp 20  Wt 310 lb 1.6 oz (140.66 kg)  SpO2 93% Physical Exam  Constitutional: He is oriented to person, place, and time. He appears well-developed and well-nourished. No distress.  Nontoxic appearing.  HENT:  Head: Normocephalic and  atraumatic.  Right Ear: External ear normal.  Left Ear: External ear normal.  Mouth/Throat: Oropharynx is clear and moist. No oropharyngeal exudate.  Eyes: Conjunctivae are normal. Pupils are equal, round, and reactive to light. Right eye exhibits no discharge. Left eye exhibits no discharge.  Neck: Neck supple. No JVD present.  Cardiovascular: Normal rate, normal heart sounds and intact distal pulses.  Exam reveals no gallop and no friction rub.   No murmur heard. Bilateral radial, and dorsalis pedis pulses are intact.  Irregularly irregular rhythm.   Pulmonary/Chest: Effort normal and breath sounds normal. No respiratory distress. He has no wheezes. He has no rales.  Lungs are clear to auscultation bilaterally.  Abdominal: Soft. He exhibits no distension. There is no tenderness.  Musculoskeletal: He exhibits edema and tenderness.  There is a 6 cm abrasion to his left anterior shin with surrounding erythema. No erythema above his knee. There is bilateral ankle  and foot edema. Good capillary refill. No abscess. No discharge noted.   Lymphadenopathy:    He has no cervical adenopathy.  Neurological: He is alert and oriented to person, place, and time. Coordination normal.  Skin: Skin is warm and dry. Rash noted. He is not diaphoretic. There is erythema. No pallor.  Psychiatric: He has a normal mood and affect. His behavior is normal.  Nursing note and vitals reviewed.   ED Course  Procedures (including critical care time) Labs Review Labs Reviewed  CBC WITH DIFFERENTIAL/PLATELET - Abnormal; Notable for the following:    Hemoglobin 17.2 (*)    All other components within normal limits  COMPREHENSIVE METABOLIC PANEL - Abnormal; Notable for the following:    Glucose, Bld 117 (*)    Creatinine, Ser 1.52 (*)    GFR calc non Af Amer 43 (*)    GFR calc Af Amer 50 (*)    All other components within normal limits  CULTURE, BLOOD (SINGLE)    Imaging Review No results found.   EKG  Interpretation None      Filed Vitals:   10/31/14 1148 10/31/14 1521 10/31/14 1523  BP: 152/86 142/63   Pulse: 90  70  Temp: 98 F (36.7 C)    TempSrc: Oral    Resp: 20    Weight: 310 lb 1.6 oz (140.66 kg)    SpO2: 94%  93%     MDM   Meds given in ED:  Medications  clindamycin (CLEOCIN) IVPB 600 mg (0 mg Intravenous Stopped 10/31/14 1606)    New Prescriptions   CLINDAMYCIN (CLEOCIN) 150 MG CAPSULE    Take 3 capsules (450 mg total) by mouth 3 (three) times daily. May dispense as 150mg  capsules   LACTOBACILLUS (ACIDOPHILUS PROBIOTIC) 10 MG TABS    Take 10 mg by mouth 3 (three) times daily.    Final diagnoses:  Cellulitis of left lower leg   This is a 79 y.o. male with a history of atrial fibrillation, diabetes, hypertension, chronic kidney disease, CHF and cellulitis of his lower extremities to presents to the emergency department complaining of worsening cellulitis of his left lower leg. The patient reports that one week ago he accidentally hit his shin on a coffee table and developed worsening cellulitis of his left lower leg. He has had cellulitis chronically to his bilateral lower legs but occasionally needs antibiotics. Patient was started on Keflex 4 days ago. He reports no improvement in his cellulitis. The patient does have an abrasion to his left anterior shin with surrounding cellulitis. The cellulitis does not extend above his knee. Patient is afebrile and nontoxic appearing. The patient does not have an elevated white count on CBC. The patient's creatinine is mildly elevated which is chronic for him. Patient received a dose of IV clindamycin in the emergency department. I feel this patient is okay to be discharged for oral antibiotic therapy. The patient's primary care provider has home health to change his dressings daily. Will redress his left leg and have him take clindamycin with the probiotic. I advised the importance of taking a probiotic with clindamycin and to watch  for signs of infectious diarrhea. I advised the patient to keep his follow-up appointment with his primary care provider for this coming Tuesday as discussed.  I advised the patient to return to the emergency department with new or worsening symptoms or new concerns. The patient verbalized understanding and agreement with plan.   This patient was discussed with and evaluated by Dr.  Linker who agrees with assessment and plan.      Waynetta Pean, PA-C 10/31/14 [place], MD 10/31/14 [phone]

## 2014-10-31 NOTE — ED Notes (Signed)
Pt reports he was recently diagnosed with cellulitis of L lower leg, started on antibiotics, no relief of symptoms. 5/10 pain upon arrival to ED.

## 2014-10-31 NOTE — Discharge Instructions (Signed)
Cellulitis Cellulitis is an infection of the skin and the tissue beneath it. The infected area is usually red and tender. Cellulitis occurs most often in the arms and lower legs.  CAUSES  Cellulitis is caused by bacteria that enter the skin through cracks or cuts in the skin. The most common types of bacteria that cause cellulitis are staphylococci and streptococci. SIGNS AND SYMPTOMS   Redness and warmth.  Swelling.  Tenderness or pain.  Fever. DIAGNOSIS  Your health care provider can usually determine what is wrong based on a physical exam. Blood tests may also be done. TREATMENT  Treatment usually involves taking an antibiotic medicine. HOME CARE INSTRUCTIONS   Take your antibiotic medicine as directed by your health care provider. Finish the antibiotic even if you start to feel better.  Keep the infected arm or leg elevated to reduce swelling.  Apply a warm cloth to the affected area up to 4 times per day to relieve pain.  Take medicines only as directed by your health care provider.  Keep all follow-up visits as directed by your health care provider. SEEK MEDICAL CARE IF:   You notice red streaks coming from the infected area.  Your red area gets larger or turns dark in color.  Your bone or joint underneath the infected area becomes painful after the skin has healed.  Your infection returns in the same area or another area.  You notice a swollen bump in the infected area.  You develop new symptoms.  You have a fever. SEEK IMMEDIATE MEDICAL CARE IF:   You feel very sleepy.  You develop vomiting or diarrhea.  You have a general ill feeling (malaise) with muscle aches and pains. MAKE SURE YOU:   Understand these instructions.  Will watch your condition.  Will get help right away if you are not doing well or get worse. Document Released: 04/06/2005 Document Revised: 11/11/2013 Document Reviewed: 09/12/2011 [facility] Patient Information 2015 [place], [place].  This information is not intended to replace advice given to you by your health care provider. Make sure you discuss any questions you have with your health care provider. Dressing Change A dressing is a material placed over wounds. It keeps the wound clean, dry, and protected from further injury. This provides an environment that favors wound healing.  BEFORE YOU BEGIN  Get your supplies together. Things you may need include:  Saline solution.  Flexible gauze dressing.  Medicated cream.  Tape.  Gloves.  Abdominal dressing pads.  Gauze squares.  Plastic bags.  Take pain medicine 30 minutes before the dressing change if you need it.  Take a shower before you do the first dressing change of the day. Use plastic wrap or a plastic bag to prevent the dressing from getting wet. REMOVING YOUR OLD DRESSING   Wash your hands with soap and water. Dry your hands with a clean towel.  Put on your gloves.  Remove any tape.  Carefully remove the old dressing. If the dressing sticks, you may dampen it with warm water to loosen it, or follow your caregiver's specific directions.  Remove any gauze or packing tape that is in your wound.  Take off your gloves.  Put the gloves, tape, gauze, or any packing tape into a plastic bag. CHANGING YOUR DRESSING  Open the supplies.  Take the cap off the saline solution.  Open the gauze package so that the gauze remains on the inside of the package.  Put on your gloves.  Clean your wound  as told by your caregiver.  If you have been told to keep your wound dry, follow those instructions.  Your caregiver may tell you to do one or more of the following:  Pick up the gauze. Pour the saline solution over the gauze. Squeeze out the extra saline solution.  Put medicated cream or other medicine on your wound if you have been told to do so.  Put the solution soaked gauze only in your wound, not on the skin around it.  Pack your wound loosely or  as told by your caregiver.  Put dry gauze on your wound.  Put abdominal dressing pads over the dry gauze if your wet gauze soaks through.  Tape the abdominal dressing pads in place so they will not fall off. Do not wrap the tape completely around the affected part (arm, leg, abdomen).  Wrap the dressing pads with a flexible gauze dressing to secure it in place.  Take off your gloves. Put them in the plastic bag with the old dressing. Tie the bag shut and throw it away.  Keep the dressing clean and dry until your next dressing change.  Wash your hands. SEEK MEDICAL CARE IF:  Your skin around the wound looks red.  Your wound feels more tender or sore.  You see pus in the wound.  Your wound smells bad.  You have a fever.  Your skin around the wound has a rash that itches and burns.  You see black or yellow skin in your wound that was not there before.  You feel nauseous, throw up, and feel very tired. Document Released: 08/04/2004 Document Revised: 09/19/2011 Document Reviewed: 05/09/2011 [facility] Patient Information 2015 [place], [place]. This information is not intended to replace advice given to you by your health care provider. Make sure you discuss any questions you have with your health care provider.

## 2014-11-01 DIAGNOSIS — I2510 Atherosclerotic heart disease of native coronary artery without angina pectoris: Secondary | ICD-10-CM | POA: Diagnosis not present

## 2014-11-01 DIAGNOSIS — L03116 Cellulitis of left lower limb: Secondary | ICD-10-CM | POA: Diagnosis not present

## 2014-11-01 DIAGNOSIS — I10 Essential (primary) hypertension: Secondary | ICD-10-CM | POA: Diagnosis not present

## 2014-11-01 DIAGNOSIS — E1151 Type 2 diabetes mellitus with diabetic peripheral angiopathy without gangrene: Secondary | ICD-10-CM | POA: Diagnosis not present

## 2014-11-01 DIAGNOSIS — I480 Paroxysmal atrial fibrillation: Secondary | ICD-10-CM | POA: Diagnosis not present

## 2014-11-01 DIAGNOSIS — I8310 Varicose veins of unspecified lower extremity with inflammation: Secondary | ICD-10-CM | POA: Diagnosis not present

## 2014-11-04 DIAGNOSIS — L03115 Cellulitis of right lower limb: Secondary | ICD-10-CM | POA: Diagnosis not present

## 2014-11-04 DIAGNOSIS — R1084 Generalized abdominal pain: Secondary | ICD-10-CM | POA: Diagnosis not present

## 2014-11-04 DIAGNOSIS — E119 Type 2 diabetes mellitus without complications: Secondary | ICD-10-CM | POA: Diagnosis not present

## 2014-11-04 DIAGNOSIS — I10 Essential (primary) hypertension: Secondary | ICD-10-CM | POA: Diagnosis not present

## 2014-11-05 DIAGNOSIS — I10 Essential (primary) hypertension: Secondary | ICD-10-CM | POA: Diagnosis not present

## 2014-11-05 DIAGNOSIS — I480 Paroxysmal atrial fibrillation: Secondary | ICD-10-CM | POA: Diagnosis not present

## 2014-11-05 DIAGNOSIS — I8310 Varicose veins of unspecified lower extremity with inflammation: Secondary | ICD-10-CM | POA: Diagnosis not present

## 2014-11-05 DIAGNOSIS — I2510 Atherosclerotic heart disease of native coronary artery without angina pectoris: Secondary | ICD-10-CM | POA: Diagnosis not present

## 2014-11-05 DIAGNOSIS — E1151 Type 2 diabetes mellitus with diabetic peripheral angiopathy without gangrene: Secondary | ICD-10-CM | POA: Diagnosis not present

## 2014-11-05 DIAGNOSIS — L03116 Cellulitis of left lower limb: Secondary | ICD-10-CM | POA: Diagnosis not present

## 2014-11-06 LAB — CULTURE, BLOOD (SINGLE): Culture: NO GROWTH

## 2014-11-08 DIAGNOSIS — I8310 Varicose veins of unspecified lower extremity with inflammation: Secondary | ICD-10-CM | POA: Diagnosis not present

## 2014-11-08 DIAGNOSIS — I2510 Atherosclerotic heart disease of native coronary artery without angina pectoris: Secondary | ICD-10-CM | POA: Diagnosis not present

## 2014-11-08 DIAGNOSIS — I480 Paroxysmal atrial fibrillation: Secondary | ICD-10-CM | POA: Diagnosis not present

## 2014-11-08 DIAGNOSIS — L03116 Cellulitis of left lower limb: Secondary | ICD-10-CM | POA: Diagnosis not present

## 2014-11-08 DIAGNOSIS — E1151 Type 2 diabetes mellitus with diabetic peripheral angiopathy without gangrene: Secondary | ICD-10-CM | POA: Diagnosis not present

## 2014-11-08 DIAGNOSIS — I10 Essential (primary) hypertension: Secondary | ICD-10-CM | POA: Diagnosis not present

## 2014-11-11 DIAGNOSIS — I2510 Atherosclerotic heart disease of native coronary artery without angina pectoris: Secondary | ICD-10-CM | POA: Diagnosis not present

## 2014-11-11 DIAGNOSIS — I8310 Varicose veins of unspecified lower extremity with inflammation: Secondary | ICD-10-CM | POA: Diagnosis not present

## 2014-11-11 DIAGNOSIS — I480 Paroxysmal atrial fibrillation: Secondary | ICD-10-CM | POA: Diagnosis not present

## 2014-11-11 DIAGNOSIS — L03116 Cellulitis of left lower limb: Secondary | ICD-10-CM | POA: Diagnosis not present

## 2014-11-11 DIAGNOSIS — E1151 Type 2 diabetes mellitus with diabetic peripheral angiopathy without gangrene: Secondary | ICD-10-CM | POA: Diagnosis not present

## 2014-11-11 DIAGNOSIS — I10 Essential (primary) hypertension: Secondary | ICD-10-CM | POA: Diagnosis not present

## 2014-11-13 DIAGNOSIS — I480 Paroxysmal atrial fibrillation: Secondary | ICD-10-CM | POA: Diagnosis not present

## 2014-11-13 DIAGNOSIS — I10 Essential (primary) hypertension: Secondary | ICD-10-CM | POA: Diagnosis not present

## 2014-11-13 DIAGNOSIS — I2510 Atherosclerotic heart disease of native coronary artery without angina pectoris: Secondary | ICD-10-CM | POA: Diagnosis not present

## 2014-11-13 DIAGNOSIS — L03116 Cellulitis of left lower limb: Secondary | ICD-10-CM | POA: Diagnosis not present

## 2014-11-13 DIAGNOSIS — I8310 Varicose veins of unspecified lower extremity with inflammation: Secondary | ICD-10-CM | POA: Diagnosis not present

## 2014-11-13 DIAGNOSIS — E1151 Type 2 diabetes mellitus with diabetic peripheral angiopathy without gangrene: Secondary | ICD-10-CM | POA: Diagnosis not present

## 2014-11-18 DIAGNOSIS — L03116 Cellulitis of left lower limb: Secondary | ICD-10-CM | POA: Diagnosis not present

## 2014-11-18 DIAGNOSIS — I480 Paroxysmal atrial fibrillation: Secondary | ICD-10-CM | POA: Diagnosis not present

## 2014-11-18 DIAGNOSIS — E1151 Type 2 diabetes mellitus with diabetic peripheral angiopathy without gangrene: Secondary | ICD-10-CM | POA: Diagnosis not present

## 2014-11-18 DIAGNOSIS — I8310 Varicose veins of unspecified lower extremity with inflammation: Secondary | ICD-10-CM | POA: Diagnosis not present

## 2014-11-18 DIAGNOSIS — I10 Essential (primary) hypertension: Secondary | ICD-10-CM | POA: Diagnosis not present

## 2014-11-18 DIAGNOSIS — I2510 Atherosclerotic heart disease of native coronary artery without angina pectoris: Secondary | ICD-10-CM | POA: Diagnosis not present

## 2014-11-25 DIAGNOSIS — E118 Type 2 diabetes mellitus with unspecified complications: Secondary | ICD-10-CM | POA: Diagnosis not present

## 2014-11-30 DIAGNOSIS — L03115 Cellulitis of right lower limb: Secondary | ICD-10-CM | POA: Diagnosis not present

## 2014-11-30 DIAGNOSIS — M79673 Pain in unspecified foot: Secondary | ICD-10-CM | POA: Diagnosis not present

## 2014-11-30 DIAGNOSIS — E119 Type 2 diabetes mellitus without complications: Secondary | ICD-10-CM | POA: Diagnosis not present

## 2014-11-30 DIAGNOSIS — I10 Essential (primary) hypertension: Secondary | ICD-10-CM | POA: Diagnosis not present

## 2014-11-30 DIAGNOSIS — E782 Mixed hyperlipidemia: Secondary | ICD-10-CM | POA: Diagnosis not present

## 2014-12-02 DIAGNOSIS — L0390 Cellulitis, unspecified: Secondary | ICD-10-CM | POA: Diagnosis not present

## 2014-12-02 DIAGNOSIS — E118 Type 2 diabetes mellitus with unspecified complications: Secondary | ICD-10-CM | POA: Diagnosis not present

## 2014-12-02 DIAGNOSIS — I10 Essential (primary) hypertension: Secondary | ICD-10-CM | POA: Diagnosis not present

## 2014-12-16 DIAGNOSIS — L0390 Cellulitis, unspecified: Secondary | ICD-10-CM | POA: Diagnosis not present

## 2014-12-16 DIAGNOSIS — E118 Type 2 diabetes mellitus with unspecified complications: Secondary | ICD-10-CM | POA: Diagnosis not present

## 2015-01-13 DIAGNOSIS — M1990 Unspecified osteoarthritis, unspecified site: Secondary | ICD-10-CM | POA: Diagnosis not present

## 2015-01-13 DIAGNOSIS — G4733 Obstructive sleep apnea (adult) (pediatric): Secondary | ICD-10-CM | POA: Diagnosis not present

## 2015-01-13 DIAGNOSIS — E119 Type 2 diabetes mellitus without complications: Secondary | ICD-10-CM | POA: Diagnosis not present

## 2015-01-13 DIAGNOSIS — E785 Hyperlipidemia, unspecified: Secondary | ICD-10-CM | POA: Diagnosis not present

## 2015-01-13 DIAGNOSIS — I2510 Atherosclerotic heart disease of native coronary artery without angina pectoris: Secondary | ICD-10-CM | POA: Diagnosis not present

## 2015-01-13 DIAGNOSIS — I482 Chronic atrial fibrillation: Secondary | ICD-10-CM | POA: Diagnosis not present

## 2015-01-13 DIAGNOSIS — I10 Essential (primary) hypertension: Secondary | ICD-10-CM | POA: Diagnosis not present

## 2015-01-13 DIAGNOSIS — E669 Obesity, unspecified: Secondary | ICD-10-CM | POA: Diagnosis not present

## 2015-01-13 DIAGNOSIS — N189 Chronic kidney disease, unspecified: Secondary | ICD-10-CM | POA: Diagnosis not present

## 2015-01-19 DIAGNOSIS — H35011 Changes in retinal vascular appearance, right eye: Secondary | ICD-10-CM | POA: Diagnosis not present

## 2015-01-19 DIAGNOSIS — H34831 Tributary (branch) retinal vein occlusion, right eye: Secondary | ICD-10-CM | POA: Diagnosis not present

## 2015-02-10 DIAGNOSIS — Z0000 Encounter for general adult medical examination without abnormal findings: Secondary | ICD-10-CM | POA: Diagnosis not present

## 2015-02-10 DIAGNOSIS — R0602 Shortness of breath: Secondary | ICD-10-CM | POA: Diagnosis not present

## 2015-02-10 DIAGNOSIS — E559 Vitamin D deficiency, unspecified: Secondary | ICD-10-CM | POA: Diagnosis not present

## 2015-02-10 DIAGNOSIS — E119 Type 2 diabetes mellitus without complications: Secondary | ICD-10-CM | POA: Diagnosis not present

## 2015-02-10 DIAGNOSIS — E780 Pure hypercholesterolemia: Secondary | ICD-10-CM | POA: Diagnosis not present

## 2015-02-10 DIAGNOSIS — R300 Dysuria: Secondary | ICD-10-CM | POA: Diagnosis not present

## 2015-02-10 DIAGNOSIS — E291 Testicular hypofunction: Secondary | ICD-10-CM | POA: Diagnosis not present

## 2015-02-10 DIAGNOSIS — D539 Nutritional anemia, unspecified: Secondary | ICD-10-CM | POA: Diagnosis not present

## 2015-02-10 DIAGNOSIS — D649 Anemia, unspecified: Secondary | ICD-10-CM | POA: Diagnosis not present

## 2015-02-10 DIAGNOSIS — M129 Arthropathy, unspecified: Secondary | ICD-10-CM | POA: Diagnosis not present

## 2015-02-17 DIAGNOSIS — M79673 Pain in unspecified foot: Secondary | ICD-10-CM | POA: Diagnosis not present

## 2015-02-17 DIAGNOSIS — R0989 Other specified symptoms and signs involving the circulatory and respiratory systems: Secondary | ICD-10-CM | POA: Diagnosis not present

## 2015-03-18 DIAGNOSIS — H40053 Ocular hypertension, bilateral: Secondary | ICD-10-CM | POA: Diagnosis not present

## 2015-04-03 DIAGNOSIS — I10 Essential (primary) hypertension: Secondary | ICD-10-CM | POA: Diagnosis not present

## 2015-04-03 DIAGNOSIS — Z23 Encounter for immunization: Secondary | ICD-10-CM | POA: Diagnosis not present

## 2015-04-03 DIAGNOSIS — L97909 Non-pressure chronic ulcer of unspecified part of unspecified lower leg with unspecified severity: Secondary | ICD-10-CM | POA: Diagnosis not present

## 2015-04-03 DIAGNOSIS — E039 Hypothyroidism, unspecified: Secondary | ICD-10-CM | POA: Diagnosis not present

## 2015-04-03 DIAGNOSIS — L0390 Cellulitis, unspecified: Secondary | ICD-10-CM | POA: Diagnosis not present

## 2015-04-14 DIAGNOSIS — E119 Type 2 diabetes mellitus without complications: Secondary | ICD-10-CM | POA: Diagnosis not present

## 2015-04-14 DIAGNOSIS — E669 Obesity, unspecified: Secondary | ICD-10-CM | POA: Diagnosis not present

## 2015-04-14 DIAGNOSIS — I129 Hypertensive chronic kidney disease with stage 1 through stage 4 chronic kidney disease, or unspecified chronic kidney disease: Secondary | ICD-10-CM | POA: Diagnosis not present

## 2015-04-14 DIAGNOSIS — I482 Chronic atrial fibrillation: Secondary | ICD-10-CM | POA: Diagnosis not present

## 2015-04-14 DIAGNOSIS — E118 Type 2 diabetes mellitus with unspecified complications: Secondary | ICD-10-CM | POA: Diagnosis not present

## 2015-04-14 DIAGNOSIS — N189 Chronic kidney disease, unspecified: Secondary | ICD-10-CM | POA: Diagnosis not present

## 2015-04-14 DIAGNOSIS — I2510 Atherosclerotic heart disease of native coronary artery without angina pectoris: Secondary | ICD-10-CM | POA: Diagnosis not present

## 2015-04-14 DIAGNOSIS — E785 Hyperlipidemia, unspecified: Secondary | ICD-10-CM | POA: Diagnosis not present

## 2015-04-14 DIAGNOSIS — G4733 Obstructive sleep apnea (adult) (pediatric): Secondary | ICD-10-CM | POA: Diagnosis not present

## 2015-04-14 DIAGNOSIS — M1990 Unspecified osteoarthritis, unspecified site: Secondary | ICD-10-CM | POA: Diagnosis not present

## 2015-04-17 DIAGNOSIS — L03115 Cellulitis of right lower limb: Secondary | ICD-10-CM | POA: Diagnosis not present

## 2015-04-17 DIAGNOSIS — I10 Essential (primary) hypertension: Secondary | ICD-10-CM | POA: Diagnosis not present

## 2015-04-17 DIAGNOSIS — M179 Osteoarthritis of knee, unspecified: Secondary | ICD-10-CM | POA: Diagnosis not present

## 2015-04-17 DIAGNOSIS — E119 Type 2 diabetes mellitus without complications: Secondary | ICD-10-CM | POA: Diagnosis not present

## 2015-04-20 DIAGNOSIS — H35011 Changes in retinal vascular appearance, right eye: Secondary | ICD-10-CM | POA: Diagnosis not present

## 2015-04-20 DIAGNOSIS — H348312 Tributary (branch) retinal vein occlusion, right eye, stable: Secondary | ICD-10-CM | POA: Diagnosis not present

## 2015-04-23 DIAGNOSIS — L0390 Cellulitis, unspecified: Secondary | ICD-10-CM | POA: Diagnosis not present

## 2015-04-23 DIAGNOSIS — E118 Type 2 diabetes mellitus with unspecified complications: Secondary | ICD-10-CM | POA: Diagnosis not present

## 2015-05-05 ENCOUNTER — Emergency Department (HOSPITAL_COMMUNITY)
Admission: EM | Admit: 2015-05-05 | Discharge: 2015-05-06 | Disposition: A | Discharge status: Home / Self Care | Payer: Medicare Other | Attending: Emergency Medicine | Admitting: Emergency Medicine

## 2015-05-05 ENCOUNTER — Encounter (HOSPITAL_COMMUNITY): Payer: Self-pay | Admitting: Emergency Medicine

## 2015-05-05 ENCOUNTER — Emergency Department (HOSPITAL_COMMUNITY): Payer: Medicare Other

## 2015-05-05 DIAGNOSIS — I509 Heart failure, unspecified: Secondary | ICD-10-CM | POA: Insufficient documentation

## 2015-05-05 DIAGNOSIS — E119 Type 2 diabetes mellitus without complications: Secondary | ICD-10-CM | POA: Insufficient documentation

## 2015-05-05 DIAGNOSIS — I4891 Unspecified atrial fibrillation: Secondary | ICD-10-CM | POA: Diagnosis not present

## 2015-05-05 DIAGNOSIS — Z7901 Long term (current) use of anticoagulants: Secondary | ICD-10-CM | POA: Insufficient documentation

## 2015-05-05 DIAGNOSIS — E7800 Pure hypercholesterolemia, unspecified: Secondary | ICD-10-CM | POA: Insufficient documentation

## 2015-05-05 DIAGNOSIS — Z955 Presence of coronary angioplasty implant and graft: Secondary | ICD-10-CM | POA: Diagnosis not present

## 2015-05-05 DIAGNOSIS — S8001XA Contusion of right knee, initial encounter: Secondary | ICD-10-CM | POA: Insufficient documentation

## 2015-05-05 DIAGNOSIS — S8991XA Unspecified injury of right lower leg, initial encounter: Secondary | ICD-10-CM | POA: Diagnosis present

## 2015-05-05 DIAGNOSIS — Y999 Unspecified external cause status: Secondary | ICD-10-CM | POA: Diagnosis not present

## 2015-05-05 DIAGNOSIS — Z792 Long term (current) use of antibiotics: Secondary | ICD-10-CM | POA: Insufficient documentation

## 2015-05-05 DIAGNOSIS — Y9301 Activity, walking, marching and hiking: Secondary | ICD-10-CM | POA: Diagnosis not present

## 2015-05-05 DIAGNOSIS — E876 Hypokalemia: Secondary | ICD-10-CM | POA: Diagnosis not present

## 2015-05-05 DIAGNOSIS — E669 Obesity, unspecified: Secondary | ICD-10-CM | POA: Insufficient documentation

## 2015-05-05 DIAGNOSIS — R9431 Abnormal electrocardiogram [ECG] [EKG]: Secondary | ICD-10-CM | POA: Diagnosis not present

## 2015-05-05 DIAGNOSIS — I129 Hypertensive chronic kidney disease with stage 1 through stage 4 chronic kidney disease, or unspecified chronic kidney disease: Secondary | ICD-10-CM | POA: Diagnosis not present

## 2015-05-05 DIAGNOSIS — H409 Unspecified glaucoma: Secondary | ICD-10-CM | POA: Insufficient documentation

## 2015-05-05 DIAGNOSIS — N189 Chronic kidney disease, unspecified: Secondary | ICD-10-CM | POA: Insufficient documentation

## 2015-05-05 DIAGNOSIS — Z79899 Other long term (current) drug therapy: Secondary | ICD-10-CM | POA: Diagnosis not present

## 2015-05-05 DIAGNOSIS — Y929 Unspecified place or not applicable: Secondary | ICD-10-CM | POA: Insufficient documentation

## 2015-05-05 DIAGNOSIS — I2510 Atherosclerotic heart disease of native coronary artery without angina pectoris: Secondary | ICD-10-CM | POA: Insufficient documentation

## 2015-05-05 DIAGNOSIS — S80212A Abrasion, left knee, initial encounter: Secondary | ICD-10-CM | POA: Diagnosis not present

## 2015-05-05 DIAGNOSIS — J449 Chronic obstructive pulmonary disease, unspecified: Secondary | ICD-10-CM | POA: Insufficient documentation

## 2015-05-05 DIAGNOSIS — W01198A Fall on same level from slipping, tripping and stumbling with subsequent striking against other object, initial encounter: Secondary | ICD-10-CM | POA: Insufficient documentation

## 2015-05-05 DIAGNOSIS — S8992XA Unspecified injury of left lower leg, initial encounter: Secondary | ICD-10-CM | POA: Insufficient documentation

## 2015-05-05 DIAGNOSIS — M7989 Other specified soft tissue disorders: Secondary | ICD-10-CM | POA: Diagnosis not present

## 2015-05-05 LAB — COMPREHENSIVE METABOLIC PANEL
ALBUMIN: 3.4 g/dL — AB (ref 3.5–5.0)
ALK PHOS: 59 U/L (ref 38–126)
ALT: 27 U/L (ref 17–63)
AST: 28 U/L (ref 15–41)
Anion gap: 11 (ref 5–15)
BUN: 18 mg/dL (ref 6–20)
CALCIUM: 9.3 mg/dL (ref 8.9–10.3)
CO2: 28 mmol/L (ref 22–32)
CREATININE: 1.42 mg/dL — AB (ref 0.61–1.24)
Chloride: 103 mmol/L (ref 101–111)
GFR calc Af Amer: 53 mL/min — ABNORMAL LOW (ref >60)
GFR calc non Af Amer: 46 mL/min — ABNORMAL LOW (ref >60)
GLUCOSE: 197 mg/dL — AB (ref 65–99)
Potassium: 4 mmol/L (ref 3.5–5.1)
Sodium: 142 mmol/L (ref 135–145)
Total Bilirubin: 0.8 mg/dL (ref 0.3–1.2)
Total Protein: 5.6 g/dL — ABNORMAL LOW (ref 6.5–8.1)

## 2015-05-05 LAB — CBC WITH DIFFERENTIAL/PLATELET
BASOS ABS: 0 10*3/uL (ref 0.0–0.1)
BASOS PCT: 0 %
EOS ABS: 0.2 10*3/uL (ref 0.0–0.7)
Eosinophils Relative: 2 %
HCT: 47.5 % (ref 39.0–52.0)
HEMOGLOBIN: 15.6 g/dL (ref 13.0–17.0)
LYMPHS ABS: 1.1 10*3/uL (ref 0.7–4.0)
Lymphocytes Relative: 11 %
MCH: 29.2 pg (ref 26.0–34.0)
MCHC: 32.8 g/dL (ref 30.0–36.0)
MCV: 89 fL (ref 78.0–100.0)
Monocytes Absolute: 0.8 10*3/uL (ref 0.1–1.0)
Monocytes Relative: 8 %
NEUTROS PCT: 79 %
Neutro Abs: 8 10*3/uL — ABNORMAL HIGH (ref 1.7–7.7)
Platelets: 199 10*3/uL (ref 150–400)
RBC: 5.34 MIL/uL (ref 4.22–5.81)
RDW: 15.9 % — ABNORMAL HIGH (ref 11.5–15.5)
WBC: 10.1 10*3/uL (ref 4.0–10.5)

## 2015-05-05 NOTE — ED Notes (Signed)
Pt. tripped and fell at home this morning , denies LOC /ambulatory , presents with right knee swelling and bruise with blisters ,  abrasions at left knee , alert and oriented  / respirations unlabored.

## 2015-05-06 ENCOUNTER — Emergency Department (HOSPITAL_COMMUNITY): Payer: Medicare Other

## 2015-05-06 DIAGNOSIS — S8001XA Contusion of right knee, initial encounter: Secondary | ICD-10-CM | POA: Diagnosis not present

## 2015-05-06 DIAGNOSIS — S80212A Abrasion, left knee, initial encounter: Secondary | ICD-10-CM | POA: Diagnosis not present

## 2015-05-06 MED ORDER — HYDROCODONE-ACETAMINOPHEN 5-325 MG PO TABS
1.0000 | ORAL_TABLET | Freq: Once | ORAL | Status: AC
  Administered 2015-05-06: 1 via ORAL
  Filled 2015-05-06: qty 1

## 2015-05-06 MED ORDER — HYDROCODONE-ACETAMINOPHEN 5-325 MG PO TABS: 1.0000 | ORAL_TABLET | Freq: Four times a day (QID) | ORAL | Status: DC | PRN

## 2015-05-06 NOTE — ED Provider Notes (Signed)
CSN: [id number]     Arrival date & time 05/05/15  1958 History   First MD Initiated Contact with Patient 05/05/15 2345     Chief Complaint  Patient presents with  . Fall  . Knee Injury     (Consider location/radiation/quality/duration/timing/severity/associated sxs/prior Treatment) HPI Comments: 79 y.o. Male with history of DM, atrial fibrillation on Xarelto, CKD, CHF presents for knee pain.  The patient reports that this morning he was walking and tripped over his own feet falling on to his knees on the carpet.  Denies head injury.  No headache or weakness.  He has had significant swelling of the right knee since that time as well as significant bruising over the area.  He has had lots of difficulty with ambulation since that time as well.  He has chronic issues with leg swelling and with cellulitis and leg wounds that he has a scheduled appointment with wound care regarding for Monday of next week.  Denies other injury or pain.  No abdominal pain or vomiting.  Patient is a 79 y.o. male presenting with fall.  Fall Pertinent negatives include no chest pain, no abdominal pain, no headaches and no shortness of breath.    Past Medical History  Diagnosis Date  . Type II or unspecified type diabetes mellitus without mention of complication, not stated as uncontrolled   . Obesity, unspecified   . Unspecified essential hypertension   . Coronary atherosclerosis of unspecified type of vessel, native or graft   . Vestibulopathy   . Hypokalemia   . Atrial fibrillation ([place])   . Rhabdomyolysis   . Hypercholesteremia   . COPD (chronic obstructive pulmonary disease) ([place])   . OSA (obstructive sleep apnea)   . Anteroseptal myocardial infarction ([place])   . Obesity hypoventilation syndrome ([place])   . Hypertensive renal disease   . Chronic kidney disease   . CHF (congestive heart failure) ([place])   . Diverticulosis   . Glaucoma    Past Surgical History  Procedure Laterality Date  . Inguinal  hernia repair Right 1985  . Coronary artery bypass graft  1997    PCI to LAD  . Nose surgery  1970s    Per Dr. Terrence Dupont [facility] in pt chart  . Knee arthroscopy Left 1991  . Lumbar fusion    . Appendectomy  1985  . Cholecystectomy  06/2000   Family History  Problem Relation Age of Onset  . COPD Mother   . Diabetes Father   . Diabetes Sister   . Heart disease Brother   . Breast cancer Maternal Aunt   . Diabetes Paternal Grandmother   . Colon cancer Maternal Aunt   . Ovarian cancer Daughter    Social History  Substance Use Topics  . Smoking status: Never Smoker   . Smokeless tobacco: Never Used  . Alcohol Use: No    Review of Systems  Constitutional: Negative for chills, appetite change and fatigue.  HENT: Negative for congestion, postnasal drip, rhinorrhea and sinus pressure.   Eyes: Negative for pain and redness.  Respiratory: Negative for cough, chest tightness, shortness of breath and wheezing.   Cardiovascular: Positive for leg swelling. Negative for chest pain and palpitations.  Gastrointestinal: Negative for nausea, vomiting, abdominal pain and diarrhea.  Genitourinary: Negative for dysuria, urgency, hematuria and flank pain.  Skin: Negative for rash.  Neurological: Negative for dizziness, seizures, weakness, light-headedness, numbness and headaches.  Hematological: Bruises/bleeds easily.      Allergies  Review of patient's allergies indicates no  known allergies.  Home Medications   Prior to Admission medications   Medication Sig Start Date End Date Taking? Authorizing Provider  amiodarone (PACERONE) 200 MG tablet Take 200 mg by mouth daily.      Historical Provider, MD  atorvastatin (LIPITOR) 20 MG tablet Take 20 mg by mouth daily at 6 PM.     Historical Provider, MD  cephALEXin (KEFLEX) 500 MG capsule Take 500 mg by mouth 3 (three) times daily. 10/28/14   Historical Provider, MD  cholecalciferol (VITAMIN D) 1000 UNITS tablet Take 1,000 Units by mouth daily.     Historical Provider, MD  clindamycin (CLEOCIN) 150 MG capsule Take 3 capsules (450 mg total) by mouth 3 (three) times daily. May dispense as 150mg  capsules 10/31/14   Waynetta Pean, PA-C  furosemide (LASIX) 40 MG tablet Take 40 mg by mouth daily.    Historical Provider, MD  glimepiride (AMARYL) 2 MG tablet Take 2 mg by mouth daily. 01/07/14   Historical Provider, MD  Lactobacillus (ACIDOPHILUS PROBIOTIC) 10 MG TABS Take 10 mg by mouth 3 (three) times daily. 10/31/14   Waynetta Pean, PA-C  latanoprost (XALATAN) 0.005 % ophthalmic solution Place 1 drop into both eyes at bedtime. 02/10/14   Historical Provider, MD  lisinopril (PRINIVIL,ZESTRIL) 40 MG tablet Take 40 mg by mouth 2 (two) times daily. 06/23/14   Historical Provider, MD  nitroGLYCERIN (NITROSTAT) 0.4 MG SL tablet Place 0.4 mg under the tongue every 5 (five) minutes as needed for chest pain.    Historical Provider, MD  potassium chloride SA (K-DUR,KLOR-CON) 20 MEQ tablet Take 20 mEq by mouth 3 (three) times daily.      Historical Provider, MD  saxagliptin HCl (ONGLYZA) 5 MG TABS tablet Take 5 mg by mouth daily.    Historical Provider, MD  telmisartan-hydrochlorothiazide (MICARDIS HCT) 80-25 MG per tablet Take 1 tablet by mouth daily.      Historical Provider, MD  Testosterone (ANDROGEL PUMP) 1.25 GM/ACT (1%) GEL Place 5 sprays onto the skin daily as needed (only takes sometimes). 5 daily    Historical Provider, MD  timolol (TIMOPTIC-XR) 0.5 % ophthalmic gel-forming Place 1 drop into both eyes at bedtime. 05/26/14   Historical Provider, MD  XARELTO 15 MG TABS tablet Take 1 tablet by mouth daily. 06/17/14   Historical Provider, MD   BP 160/88 mmHg  Pulse 103  Temp(Src) 98.6 F (37 C) (Oral)  Resp 14  SpO2 94% Physical Exam  Constitutional: He is oriented to person, place, and time. He appears well-developed and well-nourished. No distress.  HENT:  Head: Normocephalic and atraumatic.  Right Ear: External ear normal.  Left Ear: External ear  normal.  Mouth/Throat: Oropharynx is clear and moist. No oropharyngeal exudate.  Eyes: EOM are normal. Pupils are equal, round, and reactive to light.  Neck: Normal range of motion and full passive range of motion without pain. Neck supple. No spinous process tenderness and no muscular tenderness present.  Cardiovascular: Normal rate and intact distal pulses.  An irregularly irregular rhythm present.  Pulmonary/Chest: Effort normal. No respiratory distress. He has no wheezes. He has no rales.  Abdominal: Soft. There is no tenderness.  Musculoskeletal: He exhibits edema.       Right hip: Normal.       Left hip: Normal.       Right knee: He exhibits decreased range of motion (secondary to pain although is able to range joint), swelling and ecchymosis. He exhibits no deformity. Tenderness found.  Left knee: He exhibits normal range of motion, no ecchymosis, no erythema, normal alignment, no LCL laxity and no bony tenderness. Tenderness found.       Legs: Neurological: He is alert and oriented to person, place, and time. He has normal strength. No sensory deficit.  Skin: He is not diaphoretic.  Patient with chronic swelling of the bilateral lower extremities with chronic appearance of discoloration with mild hemosiderin deposition    ED Course  Procedures (including critical care time) Labs Review Labs Reviewed  CBC WITH DIFFERENTIAL/PLATELET - Abnormal; Notable for the following:    RDW 15.9 (*)    Neutro Abs 8.0 (*)    All other components within normal limits  COMPREHENSIVE METABOLIC PANEL - Abnormal; Notable for the following:    Glucose, Bld 197 (*)    Creatinine, Ser 1.42 (*)    Total Protein 5.6 (*)    Albumin 3.4 (*)    GFR calc non Af Amer 46 (*)    GFR calc Af Amer 53 (*)    All other components within normal limits    Imaging Review Dg Knee Complete 4 Views Right  05/05/2015  CLINICAL DATA:  Status post fall, with right knee pain. Initial encounter. EXAM: RIGHT KNEE  - COMPLETE 4+ VIEW COMPARISON:  Right knee radiographs performed 12/23/2009 FINDINGS: There is no evidence of fracture or dislocation. Scattered marginal osteophytes are seen arising at the medial and lateral compartments, and at the tibial spine. Wall osteophytes are also noted. The joint spaces are grossly preserved. No significant joint effusion is seen. Diffuse anterior soft tissue swelling is noted. IMPRESSION: 1. No evidence of fracture or dislocation. 2. Mild osteoarthritis at the right knee. 3. Diffuse anterior soft tissue swelling noted. Electronically Signed   By: Garald Balding M.D.   On: 05/05/2015 21:24   I have personally reviewed and evaluated these images and lab results as part of my medical decision-making.   EKG Interpretation   Date/Time:  Tuesday May 05 2015 20:38:02 EDT Ventricular Rate:  86 PR Interval:    QRS Duration: 92 QT Interval:  396 QTC Calculation: 473 R Axis:   52 Text Interpretation:  Atrial fibrillation Low voltage QRS Septal infarct ,  age undetermined Abnormal ECG No significant change since last tracing  Confirmed by NGUYEN, EMILY ([id number]) on 05/05/2015 11:49:05 PM      MDM  Patient seen and evaluated in stable condition.  Significant soft tissue swelling with hematoma over the right knee but compartments soft in lower extremity and patient neurovascularly intact.  Patient noted to be on Xarelto.  Xrays were negative for acute fracture.  Patient was able to stand and ambulate with walker although with some difficulty.  Given norco for pain control.  Patient lives a lone.  He has an appointment with wound care on Monday about his chronic lower extremity skin infections.  Discussed with patient need for ice, elevation, and strict return precautions including change in sensation/discoloration of feet.  He and his son expressed understanding.  He is to follow up with his PCP.  Home health consulted as patient with limited ambulation and lives alone with  support but limited secondary to family being busy with their own work.  Patient was discharged home in stable condition with all questions answered. Final diagnoses:  None    1. Knee injury  2. Hematoma    Harvel Quale, MD 05/06/15 [phone]

## 2015-05-06 NOTE — Discharge Instructions (Signed)
Contusion  You have a large hematoma or area of bruising around your knee.  This needs to be followed closely.  Take the pain medication as prescribed.  Ice and elevate your leg as much as possible.  Follow up with your primary care physician and keep your appointment with the wound clinic.  A contusion is a deep bruise. Contusions are the result of a blunt injury to tissues and muscle fibers under the skin. The injury causes bleeding under the skin. The skin overlying the contusion may turn blue, purple, or yellow. Minor injuries will give you a painless contusion, but more severe contusions may stay painful and swollen for a few weeks.  CAUSES  This condition is usually caused by a blow, trauma, or direct force to an area of the body. SYMPTOMS  Symptoms of this condition include:  Swelling of the injured area.  Pain and tenderness in the injured area.  Discoloration. The area may have redness and then turn blue, purple, or yellow. DIAGNOSIS  This condition is diagnosed based on a physical exam and medical history. An X-ray, CT scan, or MRI may be needed to determine if there are any associated injuries, such as broken bones (fractures). TREATMENT  Specific treatment for this condition depends on what area of the body was injured. In general, the best treatment for a contusion is resting, icing, applying pressure to (compression), and elevating the injured area. This is often called the RICE strategy. Over-the-counter anti-inflammatory medicines may also be recommended for pain control.  HOME CARE INSTRUCTIONS   Rest the injured area.  If directed, apply ice to the injured area:  Put ice in a plastic bag.  Place a towel between your skin and the bag.  Leave the ice on for 20 minutes, 2-3 times per day.  If directed, apply light compression to the injured area using an elastic bandage. Make sure the bandage is not wrapped too tightly. Remove and reapply the bandage as directed by your  health care provider.  If possible, raise (elevate) the injured area above the level of your heart while you are sitting or lying down.  Take over-the-counter and prescription medicines only as told by your health care provider. SEEK MEDICAL CARE IF:  Your symptoms do not improve after several days of treatment.  Your symptoms get worse.  You have difficulty moving the injured area. SEEK IMMEDIATE MEDICAL CARE IF:   You have severe pain.  You have numbness in a hand or foot.  Your hand or foot turns pale or cold.   This information is not intended to replace advice given to you by your health care provider. Make sure you discuss any questions you have with your health care provider.   Document Released: 04/06/2005 Document Revised: 03/18/2015 Document Reviewed: 11/12/2014 Elsevier Interactive Patient Education [employer].

## 2015-05-06 NOTE — ED Notes (Signed)
Plan to ambulate patient with walker. MD at bedside

## 2015-05-06 NOTE — ED Notes (Signed)
Patient left at this time with all belongings. 

## 2015-05-07 ENCOUNTER — Telehealth (HOSPITAL_COMMUNITY): Payer: Self-pay

## 2015-05-07 ENCOUNTER — Encounter (HOSPITAL_COMMUNITY): Payer: Self-pay

## 2015-05-07 ENCOUNTER — Telehealth: Payer: Self-pay | Admitting: *Deleted

## 2015-05-07 ENCOUNTER — Inpatient Hospital Stay (HOSPITAL_COMMUNITY)
Admission: EM | Admit: 2015-05-07 | Discharge: 2015-05-18 | DRG: 602 | Disposition: A | Discharge status: Skilled Nursing Facility | Payer: Medicare Other | Attending: Cardiovascular Disease | Admitting: Cardiovascular Disease

## 2015-05-07 ENCOUNTER — Emergency Department (HOSPITAL_COMMUNITY): Payer: Medicare Other

## 2015-05-07 DIAGNOSIS — Z951 Presence of aortocoronary bypass graft: Secondary | ICD-10-CM

## 2015-05-07 DIAGNOSIS — L03115 Cellulitis of right lower limb: Secondary | ICD-10-CM

## 2015-05-07 DIAGNOSIS — L0390 Cellulitis, unspecified: Secondary | ICD-10-CM | POA: Diagnosis present

## 2015-05-07 DIAGNOSIS — N184 Chronic kidney disease, stage 4 (severe): Secondary | ICD-10-CM

## 2015-05-07 DIAGNOSIS — E876 Hypokalemia: Secondary | ICD-10-CM | POA: Diagnosis not present

## 2015-05-07 DIAGNOSIS — S8001XA Contusion of right knee, initial encounter: Secondary | ICD-10-CM | POA: Diagnosis not present

## 2015-05-07 DIAGNOSIS — N183 Chronic kidney disease, stage 3 (moderate): Secondary | ICD-10-CM | POA: Diagnosis present

## 2015-05-07 DIAGNOSIS — E872 Acidosis: Secondary | ICD-10-CM | POA: Diagnosis not present

## 2015-05-07 DIAGNOSIS — R58 Hemorrhage, not elsewhere classified: Secondary | ICD-10-CM

## 2015-05-07 DIAGNOSIS — E1322 Other specified diabetes mellitus with diabetic chronic kidney disease: Secondary | ICD-10-CM | POA: Diagnosis present

## 2015-05-07 DIAGNOSIS — I252 Old myocardial infarction: Secondary | ICD-10-CM

## 2015-05-07 DIAGNOSIS — E669 Obesity, unspecified: Secondary | ICD-10-CM | POA: Diagnosis present

## 2015-05-07 DIAGNOSIS — Z7984 Long term (current) use of oral hypoglycemic drugs: Secondary | ICD-10-CM

## 2015-05-07 DIAGNOSIS — E119 Type 2 diabetes mellitus without complications: Secondary | ICD-10-CM

## 2015-05-07 DIAGNOSIS — N400 Enlarged prostate without lower urinary tract symptoms: Secondary | ICD-10-CM | POA: Diagnosis present

## 2015-05-07 DIAGNOSIS — I4891 Unspecified atrial fibrillation: Secondary | ICD-10-CM | POA: Diagnosis present

## 2015-05-07 DIAGNOSIS — G4733 Obstructive sleep apnea (adult) (pediatric): Secondary | ICD-10-CM | POA: Diagnosis present

## 2015-05-07 DIAGNOSIS — N390 Urinary tract infection, site not specified: Secondary | ICD-10-CM | POA: Diagnosis not present

## 2015-05-07 DIAGNOSIS — W19XXXA Unspecified fall, initial encounter: Secondary | ICD-10-CM | POA: Diagnosis present

## 2015-05-07 DIAGNOSIS — I4821 Permanent atrial fibrillation: Secondary | ICD-10-CM | POA: Diagnosis present

## 2015-05-07 DIAGNOSIS — N170 Acute kidney failure with tubular necrosis: Secondary | ICD-10-CM | POA: Diagnosis not present

## 2015-05-07 DIAGNOSIS — E785 Hyperlipidemia, unspecified: Secondary | ICD-10-CM | POA: Diagnosis present

## 2015-05-07 DIAGNOSIS — L89152 Pressure ulcer of sacral region, stage 2: Secondary | ICD-10-CM | POA: Diagnosis not present

## 2015-05-07 DIAGNOSIS — Z6841 Body Mass Index (BMI) 40.0 and over, adult: Secondary | ICD-10-CM

## 2015-05-07 DIAGNOSIS — N1330 Unspecified hydronephrosis: Secondary | ICD-10-CM | POA: Diagnosis not present

## 2015-05-07 DIAGNOSIS — E1122 Type 2 diabetes mellitus with diabetic chronic kidney disease: Secondary | ICD-10-CM | POA: Diagnosis present

## 2015-05-07 DIAGNOSIS — H409 Unspecified glaucoma: Secondary | ICD-10-CM | POA: Diagnosis present

## 2015-05-07 DIAGNOSIS — Z79899 Other long term (current) drug therapy: Secondary | ICD-10-CM

## 2015-05-07 DIAGNOSIS — J449 Chronic obstructive pulmonary disease, unspecified: Secondary | ICD-10-CM | POA: Diagnosis present

## 2015-05-07 DIAGNOSIS — S81801A Unspecified open wound, right lower leg, initial encounter: Secondary | ICD-10-CM | POA: Diagnosis present

## 2015-05-07 DIAGNOSIS — M1990 Unspecified osteoarthritis, unspecified site: Secondary | ICD-10-CM | POA: Diagnosis present

## 2015-05-07 DIAGNOSIS — I129 Hypertensive chronic kidney disease with stage 1 through stage 4 chronic kidney disease, or unspecified chronic kidney disease: Secondary | ICD-10-CM | POA: Diagnosis present

## 2015-05-07 DIAGNOSIS — M25569 Pain in unspecified knee: Secondary | ICD-10-CM | POA: Diagnosis not present

## 2015-05-07 DIAGNOSIS — Z981 Arthrodesis status: Secondary | ICD-10-CM

## 2015-05-07 DIAGNOSIS — L8990 Pressure ulcer of unspecified site, unspecified stage: Secondary | ICD-10-CM | POA: Insufficient documentation

## 2015-05-07 DIAGNOSIS — R238 Other skin changes: Secondary | ICD-10-CM | POA: Diagnosis present

## 2015-05-07 DIAGNOSIS — T148XXA Other injury of unspecified body region, initial encounter: Secondary | ICD-10-CM

## 2015-05-07 DIAGNOSIS — R531 Weakness: Secondary | ICD-10-CM | POA: Diagnosis not present

## 2015-05-07 DIAGNOSIS — I482 Chronic atrial fibrillation: Secondary | ICD-10-CM | POA: Diagnosis present

## 2015-05-07 DIAGNOSIS — I2510 Atherosclerotic heart disease of native coronary artery without angina pectoris: Secondary | ICD-10-CM | POA: Diagnosis present

## 2015-05-07 DIAGNOSIS — Z7901 Long term (current) use of anticoagulants: Secondary | ICD-10-CM

## 2015-05-07 DIAGNOSIS — S81001A Unspecified open wound, right knee, initial encounter: Secondary | ICD-10-CM | POA: Diagnosis not present

## 2015-05-07 DIAGNOSIS — N179 Acute kidney failure, unspecified: Secondary | ICD-10-CM

## 2015-05-07 DIAGNOSIS — I5032 Chronic diastolic (congestive) heart failure: Secondary | ICD-10-CM | POA: Diagnosis present

## 2015-05-07 DIAGNOSIS — D638 Anemia in other chronic diseases classified elsewhere: Secondary | ICD-10-CM | POA: Diagnosis present

## 2015-05-07 DIAGNOSIS — K5900 Constipation, unspecified: Secondary | ICD-10-CM | POA: Diagnosis present

## 2015-05-07 DIAGNOSIS — Z955 Presence of coronary angioplasty implant and graft: Secondary | ICD-10-CM

## 2015-05-07 DIAGNOSIS — R404 Transient alteration of awareness: Secondary | ICD-10-CM

## 2015-05-07 DIAGNOSIS — S8002XA Contusion of left knee, initial encounter: Secondary | ICD-10-CM | POA: Diagnosis present

## 2015-05-07 LAB — CBC WITH DIFFERENTIAL/PLATELET
Basophils Absolute: 0 10*3/uL (ref 0.0–0.1)
Basophils Relative: 0 %
EOS PCT: 2 %
Eosinophils Absolute: 0.2 10*3/uL (ref 0.0–0.7)
HCT: 44 % (ref 39.0–52.0)
Hemoglobin: 14.2 g/dL (ref 13.0–17.0)
LYMPHS ABS: 1.4 10*3/uL (ref 0.7–4.0)
LYMPHS PCT: 15 %
MCH: 29 pg (ref 26.0–34.0)
MCHC: 32.3 g/dL (ref 30.0–36.0)
MCV: 90 fL (ref 78.0–100.0)
MONO ABS: 1.2 10*3/uL — AB (ref 0.1–1.0)
MONOS PCT: 13 %
Neutro Abs: 6.5 10*3/uL (ref 1.7–7.7)
Neutrophils Relative %: 70 %
PLATELETS: 196 10*3/uL (ref 150–400)
RBC: 4.89 MIL/uL (ref 4.22–5.81)
RDW: 16.2 % — ABNORMAL HIGH (ref 11.5–15.5)
WBC: 9.2 10*3/uL (ref 4.0–10.5)

## 2015-05-07 LAB — PROTIME-INR
INR: 1.2 (ref 0.00–1.49)
Prothrombin Time: 15.4 seconds — ABNORMAL HIGH (ref 11.6–15.2)

## 2015-05-07 LAB — BASIC METABOLIC PANEL
Anion gap: 8 (ref 5–15)
BUN: 18 mg/dL (ref 6–20)
CO2: 27 mmol/L (ref 22–32)
Calcium: 9.1 mg/dL (ref 8.9–10.3)
Chloride: 105 mmol/L (ref 101–111)
Creatinine, Ser: 1.29 mg/dL — ABNORMAL HIGH (ref 0.61–1.24)
GFR calc Af Amer: >60 mL/min (ref >60)
GFR, EST NON AFRICAN AMERICAN: 52 mL/min — AB (ref >60)
GLUCOSE: 149 mg/dL — AB (ref 65–99)
POTASSIUM: 3.8 mmol/L (ref 3.5–5.1)
Sodium: 140 mmol/L (ref 135–145)

## 2015-05-07 MED ORDER — VANCOMYCIN HCL 10 G IV SOLR
2000.0000 mg | Freq: Once | INTRAVENOUS | Status: AC
  Administered 2015-05-07: 2000 mg via INTRAVENOUS
  Filled 2015-05-07: qty 2000

## 2015-05-07 NOTE — Telephone Encounter (Signed)
Pt daughter-in-law Zigmund Daniel) called to confirm [facility] was ordered for her father-in-law.  NCM reviewed chart to find order and set up [facility] with [facility], as pt has used them in the past.  NCM relayed information to Zigmund Daniel to look for call from agency.

## 2015-05-07 NOTE — ED Provider Notes (Signed)
CSN: [id number]     Arrival date & time 05/07/15  1956 History   First MD Initiated Contact with Patient 05/07/15 2051     Chief Complaint  Patient presents with  . Knee Pain      HPI  Patient presents for evaluation of continued pain, swelling, discoloration of his knee and leg.  Patient has a history of chronic A. fib on Xarelto. Seen and evaluated here 10-24/25, after a fall in his home. He had some superficial abrasions to his knees and some knee pain. Had regular x-rays that showed no acute abnormalities. Discharged home. This had progressive swelling erythema of his knee and lower leg now with multiple large blood filled bullae on his right leg. Normally is able to ambulate. Has not been able to ambulate since Tuesday without the assistance of another person in a cane. Leg is becoming tight. He states when he puts pressure on it feels painful. He states "it feels like it's going to split open".  Denies fever shakes chills. Does have a history of chronic or recurrent cellulitis in his bilateral lower extremities with chronic edema and venous stasis.  Past Medical History  Diagnosis Date  . Type II or unspecified type diabetes mellitus without mention of complication, not stated as uncontrolled   . Obesity, unspecified   . Unspecified essential hypertension   . Coronary atherosclerosis of unspecified type of vessel, native or graft   . Vestibulopathy   . Hypokalemia   . Atrial fibrillation ([place])   . Rhabdomyolysis   . Hypercholesteremia   . COPD (chronic obstructive pulmonary disease) ([place])   . OSA (obstructive sleep apnea)   . Anteroseptal myocardial infarction ([place])   . Obesity hypoventilation syndrome ([place])   . Hypertensive renal disease   . Chronic kidney disease   . CHF (congestive heart failure) ([place])   . Diverticulosis   . Glaucoma    Past Surgical History  Procedure Laterality Date  . Inguinal hernia repair Right 1985  . Coronary artery bypass graft  1997    PCI  to LAD  . Nose surgery  1970s    Per Dr. Terrence Dupont [facility] in pt chart  . Knee arthroscopy Left 1991  . Lumbar fusion    . Appendectomy  1985  . Cholecystectomy  06/2000   Family History  Problem Relation Age of Onset  . COPD Mother   . Diabetes Father   . Diabetes Sister   . Heart disease Brother   . Breast cancer Maternal Aunt   . Diabetes Paternal Grandmother   . Colon cancer Maternal Aunt   . Ovarian cancer Daughter    Social History  Substance Use Topics  . Smoking status: Never Smoker   . Smokeless tobacco: Never Used  . Alcohol Use: No    Review of Systems  Constitutional: Negative for fever, chills, diaphoresis, appetite change and fatigue.  HENT: Negative for mouth sores, sore throat and trouble swallowing.   Eyes: Negative for visual disturbance.  Respiratory: Negative for cough, chest tightness, shortness of breath and wheezing.   Cardiovascular: Negative for chest pain.  Gastrointestinal: Negative for nausea, vomiting, abdominal pain, diarrhea and abdominal distention.  Endocrine: Negative for polydipsia, polyphagia and polyuria.  Genitourinary: Negative for dysuria, frequency and hematuria.  Musculoskeletal: Positive for joint swelling and arthralgias. Negative for gait problem.  Skin: Positive for color change and wound. Negative for pallor and rash.  Neurological: Negative for dizziness, syncope, light-headedness and headaches.  Hematological: Does not bruise/bleed easily.  Psychiatric/Behavioral: Negative for behavioral problems and confusion.      Allergies  Review of patient's allergies indicates no known allergies.  Home Medications   Prior to Admission medications   Medication Sig Start Date End Date Taking? Authorizing Provider  amiodarone (PACERONE) 200 MG tablet Take 200 mg by mouth at bedtime.    Yes Historical Provider, MD  atorvastatin (LIPITOR) 20 MG tablet Take 20 mg by mouth daily at 6 PM.    Yes Historical Provider, MD  cholecalciferol  (VITAMIN D) 1000 UNITS tablet Take 1,000 Units by mouth daily.   Yes Historical Provider, MD  furosemide (LASIX) 40 MG tablet Take 40 mg by mouth at bedtime.    Yes Historical Provider, MD  HYDROcodone-acetaminophen (NORCO) 5-325 MG tablet Take 1-2 tablets by mouth every 6 (six) hours as needed for moderate pain or severe pain. 05/06/15  Yes Harvel Quale, MD  Lactobacillus (ACIDOPHILUS PROBIOTIC) 10 MG TABS Take 10 mg by mouth 3 (three) times daily. Patient taking differently: Take 10 mg by mouth at bedtime.  10/31/14  Yes Waynetta Pean, PA-C  latanoprost (XALATAN) 0.005 % ophthalmic solution Place 1 drop into both eyes at bedtime. 02/10/14  Yes Historical Provider, MD  lisinopril (PRINIVIL,ZESTRIL) 40 MG tablet Take 40 mg by mouth 2 (two) times daily. 06/23/14  Yes Historical Provider, MD  nitroGLYCERIN (NITROSTAT) 0.4 MG SL tablet Place 0.4 mg under the tongue every 5 (five) minutes as needed for chest pain.   Yes Historical Provider, MD  potassium chloride SA (K-DUR,KLOR-CON) 20 MEQ tablet Take 20 mEq by mouth 3 (three) times daily.     Yes Historical Provider, MD  saxagliptin HCl (ONGLYZA) 5 MG TABS tablet Take 5 mg by mouth daily.   Yes Historical Provider, MD  XARELTO 15 MG TABS tablet Take 15 mg by mouth daily with supper.  06/17/14  Yes Historical Provider, MD  clindamycin (CLEOCIN) 150 MG capsule Take 3 capsules (450 mg total) by mouth 3 (three) times daily. May dispense as 150mg  capsules Patient not taking: Reported on 05/07/2015 10/31/14   Waynetta Pean, PA-C   BP 136/68 mmHg  Pulse 52  Temp(Src) 98.4 F (36.9 C) (Oral)  Resp 25  Ht 5\' 11"  (1.803 m)  Wt 303 lb (137.44 kg)  BMI 42.28 kg/m2  SpO2 83% Physical Exam  Constitutional: He is oriented to person, place, and time. He appears well-developed and well-nourished. No distress.  HENT:  Head: Normocephalic.  Eyes: Conjunctivae are normal. Pupils are equal, round, and reactive to light. No scleral icterus.  Neck: Normal range  of motion. Neck supple. No thyromegaly present.  Cardiovascular: Normal rate and regular rhythm.  Exam reveals no gallop and no friction rub.   No murmur heard. Pulmonary/Chest: Effort normal and breath sounds normal. No respiratory distress. He has no wheezes. He has no rales.  Abdominal: Soft. Bowel sounds are normal. He exhibits no distension. There is no tenderness. There is no rebound.  Musculoskeletal: Normal range of motion.       Legs: Neurological: He is alert and oriented to person, place, and time.  Skin: Skin is warm and dry. No rash noted.  Psychiatric: He has a normal mood and affect. His behavior is normal.    ED Course  Procedures (including critical care time) Labs Review Labs Reviewed  PROTIME-INR - Abnormal; Notable for the following:    Prothrombin Time 15.4 (*)    All other components within normal limits  CBC WITH DIFFERENTIAL/PLATELET - Abnormal; Notable for the  following:    RDW 16.2 (*)    Monocytes Absolute 1.2 (*)    All other components within normal limits  BASIC METABOLIC PANEL - Abnormal; Notable for the following:    Glucose, Bld 149 (*)    Creatinine, Ser 1.29 (*)    GFR calc non Af Amer 52 (*)    All other components within normal limits  CULTURE, BLOOD (ROUTINE X 2)  CULTURE, BLOOD (ROUTINE X 2)    Imaging Review Dg Knee Complete 4 Views Left  05/06/2015  CLINICAL DATA:  79 year old male with history of trauma from fall earlier today with abrasion to the anterior aspect of the left knee. EXAM: LEFT KNEE - COMPLETE 4+ VIEW COMPARISON:  Left knee radiograph 12/23/2009. FINDINGS: Four views of the left knee demonstrate no acute displaced fracture, subluxation or dislocation. There is severe joint space narrowing, subchondral sclerosis, subchondral cyst formation and osteophyte formation in a tricompartmental distribution, compatible with osteoarthritis. IMPRESSION: 1. No acute radiographic abnormality of the left knee. 2. Tricompartmental  osteoarthritis, as above. Electronically Signed   By: Vinnie Langton M.D.   On: 05/06/2015 00:47   Dg Knee Complete 4 Views Right  05/07/2015  CLINICAL DATA:  Worsening pain and wound infection. EXAM: RIGHT KNEE - COMPLETE 4+ VIEW COMPARISON:  May 05, 2015. FINDINGS: There is no evidence of fracture, dislocation, or joint effusion. Mild narrowing of the medial and lateral joint spaces are noted with osteophyte formation. Soft tissues are unremarkable. IMPRESSION: Mild degenerative joint disease. No acute abnormality seen in the right knee. Electronically Signed   By: Marijo Conception, M.D.   On: 05/07/2015 21:15   I have personally reviewed and evaluated these images and lab results as part of my medical decision-making.   EKG Interpretation None      MDM   Final diagnoses:  Cellulitis of right lower extremity  Bullae  Contusion  Ecchymosis    He said not febrile. No leukocytosis. Differential diagnosis would include ecchymosis. This is evident from his exam. However his ecchymosis is primarily at the knee and in the tissues of the thigh. Inferiorly has erythema and soft tissue swelling tenderness consistent with cellulitis.    Tanna Furry, MD 05/07/15 [phone]

## 2015-05-07 NOTE — Progress Notes (Addendum)
ANTIBIOTIC CONSULT NOTE - INITIAL  Pharmacy Consult for Vancomycin Indication: wound infection  No Known Allergies  Patient Measurements: Height: 5\' 11"  (180.3 cm) Weight: (!) 303 lb (137.44 kg) IBW/kg (Calculated) : 75.3 Adjusted Body Weight:   Vital Signs: Temp: 98.4 F (36.9 C) (10/27 2011) Temp Source: Oral (10/27 2011) BP: 128/64 mmHg (10/27 2030) Pulse Rate: 120 (10/27 2030) Intake/Output from previous day:   Intake/Output from this shift:    Labs:  Recent Labs  05/05/15 2051  WBC 10.1  HGB 15.6  PLT 199  CREATININE 1.42*   Estimated Creatinine Clearance: 61.7 mL/min (by C-G formula based on Cr of 1.42). No results for input(s): VANCOTROUGH, VANCOPEAK, VANCORANDOM, GENTTROUGH, GENTPEAK, GENTRANDOM, TOBRATROUGH, TOBRAPEAK, TOBRARND, AMIKACINPEAK, AMIKACINTROU, AMIKACIN in the last 72 hours.   Microbiology: No results found for this or any previous visit (from the past 720 hour(s)).  Medical History: Past Medical History  Diagnosis Date  . Type II or unspecified type diabetes mellitus without mention of complication, not stated as uncontrolled   . Obesity, unspecified   . Unspecified essential hypertension   . Coronary atherosclerosis of unspecified type of vessel, native or graft   . Vestibulopathy   . Hypokalemia   . Atrial fibrillation ([place])   . Rhabdomyolysis   . Hypercholesteremia   . COPD (chronic obstructive pulmonary disease) ([place])   . OSA (obstructive sleep apnea)   . Anteroseptal myocardial infarction ([place])   . Obesity hypoventilation syndrome ([place])   . Hypertensive renal disease   . Chronic kidney disease   . CHF (congestive heart failure) ([place])   . Diverticulosis   . Glaucoma     Medications:   (Not in a hospital admission) Scheduled:   Infusions:  . vancomycin     Assessment: 79yo male with history of DM, Afib on xarelto, CKD and CHF presents with increased R knee pain and swelling. Pharmacy is consulted to dose vancomycin for  wound infection. Pt is afebrile and labs are pending.   Goal of Therapy:  Vancomycin trough level 10-15 mcg/ml  Plan:  Vancomycin 2g IV load Measure antibiotic drug levels at steady state Follow up culture results, renal function and clinical course Will redose based on labs  Andrey Cota. Diona Foley, PharmD Clinical Pharmacist Pager [phone] 05/07/2015,9:57 PM    Addendum -SCr 1.29, eCrCl ~65 ml/min, begin vancomycin 1 g IV q12h -Check VT at steady state   Harvel Quale  05/08/2015

## 2015-05-07 NOTE — ED Notes (Signed)
Patient transported to CT 

## 2015-05-07 NOTE — ED Notes (Signed)
Per EMS pt fell in home on carpet x 2 days ago, was seen in ED; Pt hit bilateral knees during fall but right knee pain has increased in the last 2 days; pt has hx of leg wound on bottom of right leg and sees wound care nurse; pt states development of right knee swelling with bruising and blistering; pt has hx of DM, HTN, AFib, stent placed, and COPD; pt sleeps with CPAP at night; Pt ambulates with walker at home; Pt c/o of pain 5/10 on arrival; pt a&ox 4 on arrival;

## 2015-05-08 ENCOUNTER — Encounter (HOSPITAL_COMMUNITY): Payer: Self-pay | Admitting: *Deleted

## 2015-05-08 DIAGNOSIS — E118 Type 2 diabetes mellitus with unspecified complications: Secondary | ICD-10-CM

## 2015-05-08 DIAGNOSIS — N183 Chronic kidney disease, stage 3 (moderate): Secondary | ICD-10-CM

## 2015-05-08 DIAGNOSIS — I129 Hypertensive chronic kidney disease with stage 1 through stage 4 chronic kidney disease, or unspecified chronic kidney disease: Secondary | ICD-10-CM

## 2015-05-08 DIAGNOSIS — I5032 Chronic diastolic (congestive) heart failure: Secondary | ICD-10-CM | POA: Diagnosis present

## 2015-05-08 DIAGNOSIS — E669 Obesity, unspecified: Secondary | ICD-10-CM

## 2015-05-08 DIAGNOSIS — H409 Unspecified glaucoma: Secondary | ICD-10-CM | POA: Diagnosis present

## 2015-05-08 DIAGNOSIS — L03115 Cellulitis of right lower limb: Secondary | ICD-10-CM | POA: Diagnosis not present

## 2015-05-08 DIAGNOSIS — G4733 Obstructive sleep apnea (adult) (pediatric): Secondary | ICD-10-CM | POA: Diagnosis present

## 2015-05-08 DIAGNOSIS — E785 Hyperlipidemia, unspecified: Secondary | ICD-10-CM | POA: Diagnosis not present

## 2015-05-08 DIAGNOSIS — N184 Chronic kidney disease, stage 4 (severe): Secondary | ICD-10-CM

## 2015-05-08 DIAGNOSIS — I482 Chronic atrial fibrillation: Secondary | ICD-10-CM

## 2015-05-08 DIAGNOSIS — E119 Type 2 diabetes mellitus without complications: Secondary | ICD-10-CM

## 2015-05-08 DIAGNOSIS — E1322 Other specified diabetes mellitus with diabetic chronic kidney disease: Secondary | ICD-10-CM | POA: Diagnosis present

## 2015-05-08 DIAGNOSIS — D649 Anemia, unspecified: Secondary | ICD-10-CM | POA: Diagnosis not present

## 2015-05-08 DIAGNOSIS — I2510 Atherosclerotic heart disease of native coronary artery without angina pectoris: Secondary | ICD-10-CM | POA: Diagnosis not present

## 2015-05-08 DIAGNOSIS — R238 Other skin changes: Secondary | ICD-10-CM | POA: Diagnosis present

## 2015-05-08 DIAGNOSIS — I4891 Unspecified atrial fibrillation: Secondary | ICD-10-CM | POA: Diagnosis present

## 2015-05-08 DIAGNOSIS — I4821 Permanent atrial fibrillation: Secondary | ICD-10-CM | POA: Diagnosis present

## 2015-05-08 LAB — GLUCOSE, CAPILLARY
Glucose-Capillary: 142 mg/dL — ABNORMAL HIGH (ref 65–99)
Glucose-Capillary: 129 mg/dL — ABNORMAL HIGH (ref 65–99)
Glucose-Capillary: 137 mg/dL — ABNORMAL HIGH (ref 65–99)
Glucose-Capillary: 150 mg/dL — ABNORMAL HIGH (ref 65–99)
Glucose-Capillary: 166 mg/dL — ABNORMAL HIGH (ref 65–99)

## 2015-05-08 LAB — COMPREHENSIVE METABOLIC PANEL
ALBUMIN: 2.9 g/dL — AB (ref 3.5–5.0)
ALT: 21 U/L (ref 17–63)
ANION GAP: 6 (ref 5–15)
AST: 18 U/L (ref 15–41)
Alkaline Phosphatase: 58 U/L (ref 38–126)
BILIRUBIN TOTAL: 1 mg/dL (ref 0.3–1.2)
BUN: 17 mg/dL (ref 6–20)
CO2: 30 mmol/L (ref 22–32)
Calcium: 8.7 mg/dL — ABNORMAL LOW (ref 8.9–10.3)
Chloride: 105 mmol/L (ref 101–111)
Creatinine, Ser: 1.46 mg/dL — ABNORMAL HIGH (ref 0.61–1.24)
GFR calc Af Amer: 52 mL/min — ABNORMAL LOW (ref >60)
GFR calc non Af Amer: 45 mL/min — ABNORMAL LOW (ref >60)
GLUCOSE: 138 mg/dL — AB (ref 65–99)
POTASSIUM: 3.9 mmol/L (ref 3.5–5.1)
SODIUM: 141 mmol/L (ref 135–145)
TOTAL PROTEIN: 5.5 g/dL — AB (ref 6.5–8.1)

## 2015-05-08 LAB — CBC
HEMATOCRIT: 40.3 % (ref 39.0–52.0)
HEMOGLOBIN: 12.9 g/dL — AB (ref 13.0–17.0)
MCH: 29 pg (ref 26.0–34.0)
MCHC: 32 g/dL (ref 30.0–36.0)
MCV: 90.6 fL (ref 78.0–100.0)
Platelets: 195 10*3/uL (ref 150–400)
RBC: 4.45 MIL/uL (ref 4.22–5.81)
RDW: 16.2 % — ABNORMAL HIGH (ref 11.5–15.5)
WBC: 8.2 10*3/uL (ref 4.0–10.5)

## 2015-05-08 LAB — SEDIMENTATION RATE: SED RATE: 30 mm/h — AB (ref 0–16)

## 2015-05-08 LAB — CK: CK TOTAL: 168 U/L (ref 49–397)

## 2015-05-08 LAB — PROTIME-INR
INR: 1.21 (ref 0.00–1.49)
Prothrombin Time: 15.5 seconds — ABNORMAL HIGH (ref 11.6–15.2)

## 2015-05-08 LAB — C-REACTIVE PROTEIN: CRP: 9.6 mg/dL — AB (ref <1.0)

## 2015-05-08 MED ORDER — LISINOPRIL 10 MG PO TABS
10.0000 mg | ORAL_TABLET | Freq: Two times a day (BID) | ORAL | Status: DC
  Administered 2015-05-08: 10 mg via ORAL
  Filled 2015-05-08 (×2): qty 1

## 2015-05-08 MED ORDER — ACETAMINOPHEN 325 MG PO TABS
650.0000 mg | ORAL_TABLET | Freq: Four times a day (QID) | ORAL | Status: DC | PRN
  Filled 2015-05-08 (×2): qty 2

## 2015-05-08 MED ORDER — FUROSEMIDE 10 MG/ML IJ SOLN
40.0000 mg | Freq: Once | INTRAMUSCULAR | Status: AC
  Administered 2015-05-08: 40 mg via INTRAVENOUS
  Filled 2015-05-08: qty 4

## 2015-05-08 MED ORDER — MORPHINE SULFATE (PF) 4 MG/ML IV SOLN
4.0000 mg | INTRAVENOUS | Status: DC | PRN
  Administered 2015-05-08: 4 mg via INTRAVENOUS
  Filled 2015-05-08: qty 1

## 2015-05-08 MED ORDER — HYDROCODONE-ACETAMINOPHEN 5-325 MG PO TABS: 1.0000 | ORAL_TABLET | Freq: Four times a day (QID) | ORAL | Status: DC | PRN

## 2015-05-08 MED ORDER — ACETAMINOPHEN 650 MG RE SUPP: 650.0000 mg | Freq: Four times a day (QID) | RECTAL | Status: DC | PRN

## 2015-05-08 MED ORDER — INSULIN ASPART 100 UNIT/ML ~~LOC~~ SOLN
0.0000 [IU] | Freq: Three times a day (TID) | SUBCUTANEOUS | Status: DC
Start: 2015-05-08 — End: 2015-05-18
  Administered 2015-05-08 (×3): 2 [IU] via SUBCUTANEOUS
  Administered 2015-05-09: 3 [IU] via SUBCUTANEOUS
  Administered 2015-05-09: 2 [IU] via SUBCUTANEOUS
  Administered 2015-05-09 – 2015-05-10 (×4): 3 [IU] via SUBCUTANEOUS
  Administered 2015-05-11: 8 [IU] via SUBCUTANEOUS
  Administered 2015-05-11: 3 [IU] via SUBCUTANEOUS
  Administered 2015-05-11: 5 [IU] via SUBCUTANEOUS
  Administered 2015-05-12: 3 [IU] via SUBCUTANEOUS
  Administered 2015-05-12 (×2): 8 [IU] via SUBCUTANEOUS
  Administered 2015-05-13: 5 [IU] via SUBCUTANEOUS
  Administered 2015-05-13: 3 [IU] via SUBCUTANEOUS
  Administered 2015-05-14: 2 [IU] via SUBCUTANEOUS
  Administered 2015-05-15: 3 [IU] via SUBCUTANEOUS
  Administered 2015-05-15: 5 [IU] via SUBCUTANEOUS
  Administered 2015-05-16 (×2): 2 [IU] via SUBCUTANEOUS
  Administered 2015-05-16: 5 [IU] via SUBCUTANEOUS
  Administered 2015-05-17: 3 [IU] via SUBCUTANEOUS
  Administered 2015-05-17: 2 [IU] via SUBCUTANEOUS
  Administered 2015-05-17: 5 [IU] via SUBCUTANEOUS
  Administered 2015-05-18 (×2): 2 [IU] via SUBCUTANEOUS

## 2015-05-08 MED ORDER — ATORVASTATIN CALCIUM 20 MG PO TABS
20.0000 mg | ORAL_TABLET | Freq: Every day | ORAL | Status: DC
  Administered 2015-05-08 – 2015-05-17 (×10): 20 mg via ORAL
  Filled 2015-05-08 (×10): qty 1

## 2015-05-08 MED ORDER — OXYCODONE HCL 5 MG PO TABS: 5.0000 mg | ORAL_TABLET | ORAL | Status: DC | PRN

## 2015-05-08 MED ORDER — ONDANSETRON HCL 4 MG PO TABS: 4.0000 mg | ORAL_TABLET | Freq: Four times a day (QID) | ORAL | Status: DC | PRN

## 2015-05-08 MED ORDER — HYDROCODONE-ACETAMINOPHEN 5-325 MG PO TABS
1.0000 | ORAL_TABLET | Freq: Four times a day (QID) | ORAL | Status: DC | PRN
Start: 2015-05-08 — End: 2015-05-14
  Administered 2015-05-08: 1 via ORAL
  Administered 2015-05-09: 2 via ORAL
  Filled 2015-05-08: qty 1
  Filled 2015-05-08: qty 2

## 2015-05-08 MED ORDER — LISINOPRIL 40 MG PO TABS
40.0000 mg | ORAL_TABLET | Freq: Two times a day (BID) | ORAL | Status: DC
  Administered 2015-05-08: 40 mg via ORAL
  Filled 2015-05-08: qty 1

## 2015-05-08 MED ORDER — ONDANSETRON HCL 4 MG/2ML IJ SOLN: 4.0000 mg | Freq: Four times a day (QID) | INTRAMUSCULAR | Status: DC | PRN

## 2015-05-08 MED ORDER — AMIODARONE HCL 200 MG PO TABS
200.0000 mg | ORAL_TABLET | Freq: Every day | ORAL | Status: DC
  Administered 2015-05-08 – 2015-05-17 (×11): 200 mg via ORAL
  Filled 2015-05-08 (×11): qty 1

## 2015-05-08 MED ORDER — METOPROLOL TARTRATE 25 MG PO TABS
25.0000 mg | ORAL_TABLET | Freq: Two times a day (BID) | ORAL | Status: DC
  Administered 2015-05-08 – 2015-05-10 (×5): 25 mg via ORAL
  Filled 2015-05-08 (×5): qty 1

## 2015-05-08 MED ORDER — SENNOSIDES-DOCUSATE SODIUM 8.6-50 MG PO TABS: 1.0000 | ORAL_TABLET | Freq: Every evening | ORAL | Status: DC | PRN

## 2015-05-08 MED ORDER — FUROSEMIDE 10 MG/ML IJ SOLN
40.0000 mg | Freq: Every day | INTRAMUSCULAR | Status: DC
  Administered 2015-05-08: 40 mg via INTRAVENOUS
  Filled 2015-05-08: qty 4

## 2015-05-08 MED ORDER — INSULIN ASPART 100 UNIT/ML ~~LOC~~ SOLN
0.0000 [IU] | Freq: Every day | SUBCUTANEOUS | Status: DC
  Administered 2015-05-11: 2 [IU] via SUBCUTANEOUS

## 2015-05-08 MED ORDER — SODIUM CHLORIDE 0.9 % IJ SOLN
3.0000 mL | Freq: Two times a day (BID) | INTRAMUSCULAR | Status: DC
  Administered 2015-05-08 – 2015-05-18 (×16): 3 mL via INTRAVENOUS

## 2015-05-08 MED ORDER — ONDANSETRON HCL 4 MG/2ML IJ SOLN
4.0000 mg | Freq: Once | INTRAMUSCULAR | Status: AC
  Administered 2015-05-08: 4 mg via INTRAVENOUS
  Filled 2015-05-08: qty 2

## 2015-05-08 MED ORDER — LATANOPROST 0.005 % OP SOLN
1.0000 [drp] | Freq: Every day | OPHTHALMIC | Status: DC
  Administered 2015-05-08 – 2015-05-17 (×9): 1 [drp] via OPHTHALMIC
  Filled 2015-05-08 (×2): qty 2.5

## 2015-05-08 MED ORDER — RISAQUAD PO CAPS
1.0000 | ORAL_CAPSULE | Freq: Every day | ORAL | Status: DC
  Administered 2015-05-08 – 2015-05-17 (×11): 1 via ORAL
  Filled 2015-05-08 (×11): qty 1

## 2015-05-08 MED ORDER — VANCOMYCIN HCL IN DEXTROSE 1-5 GM/200ML-% IV SOLN
1000.0000 mg | Freq: Two times a day (BID) | INTRAVENOUS | Status: DC
  Administered 2015-05-09 – 2015-05-12 (×7): 1000 mg via INTRAVENOUS
  Filled 2015-05-08 (×8): qty 200

## 2015-05-08 NOTE — Consult Note (Signed)
WOC wound consult note Reason for Consult: LE wounds. Pt reports fall at home on Tuesday, at which time he twisted his knee and tripped.  He was to be seen on Monday in the wound care center but he was transported to the ED yesterday.  He reports he wears 3mmHG compression stocking that were fitted at a medical supply store in [place]. He has been having more trouble donning his stockings and has been using some type of rubber reacher to remove them at which time he has injured his leg.  He has chronic venous stasis disease and hemosiderin staining. 2+-3+ pitting edema bilaterally, palpable pulses.  Wound type:  He has a hematoma on the right knee due to the injury, now has blistering of this area. Related to edema and he is on a anticoagulant, CCS has evaluated this site and is not recommending in surgical intervention at this time.  He has hemosiderin staining bilaterally.  He has some weeping bilaterally but not significant.  One small ulcer on the LLE pretibial area, it sounds like it may be related to injury with donning or removal of his stockings.  Pressure Ulcer POA: No Measurement: Hematoma with serous blisters, right knee 10cm x 7cm x 0 Drainage (amount, consistency, odor) some weeping at the knee and the pretibial areas  Periwound: hemosiderin staining bilaterally Dressing procedure/placement/frequency: Will add xeroform non adherent to cover the bulla and protect with antibacterial coverage.  Patient to resume compression stocking to the LLE at DC.  Patient to follow up with wound care center as scheduled.  Do not use compression on the RLE until the hematoma resolves which may be some time.  Discussed POC with patient and bedside nurse.  Re consult if needed, will not follow at this time. Thanks  Merissa Renwick Kellogg, [address] [phone])

## 2015-05-08 NOTE — Progress Notes (Signed)
Subjective:  Complains of right knee joint pain no fever or chills. Denies any chest pain or shortness of breath  Objective:  Vital Signs in the last 24 hours: Temp:  [97.7 F (36.5 C)-98.4 F (36.9 C)] 98 F (36.7 C) (10/28 0504) Pulse Rate:  [52-120] 87 (10/28 0504) Resp:  [17-25] 20 (10/28 0504) BP: (123-157)/(48-72) 137/59 mmHg (10/28 0504) SpO2:  [83 %-95 %] 92 % (10/28 0504) Weight:  [136.805 kg (301 lb 9.6 oz)-137.44 kg (303 lb)] 136.9 kg (301 lb 13 oz) (10/28 0503)  Intake/Output from previous day: 10/27 0701 - 10/28 0700 In: -  Out: 1025 [Urine:1025] Intake/Output from this shift: Total I/O In: 60 [P.O.:60] Out: -   Physical Exam: Neck: no adenopathy, no carotid bruit, no JVD and supple, symmetrical, trachea midline Lungs: clear to auscultation bilaterally Heart: irregularly irregular rhythm, S1, S2 normal and Soft systolic murmur noted Abdomen: Obese nontender Extremities: No clubbing cyanosis 2+ edema with chronic dermatosis left leg right leg large ecchymosis with multiple blisters over the right knee  Lab Results:  Recent Labs  05/07/15 2237 05/08/15 0435  WBC 9.2 8.2  HGB 14.2 12.9*  PLT 196 195    Recent Labs  05/07/15 2237 05/08/15 0435  NA 140 141  K 3.8 3.9  CL 105 105  CO2 27 30  GLUCOSE 149* 138*  BUN 18 17  CREATININE 1.29* 1.46*   No results for input(s): TROPONINI in the last 72 hours.  Invalid input(s): CK, MB Hepatic Function Panel  Recent Labs  05/08/15 0435  PROT 5.5*  ALBUMIN 2.9*  AST 18  ALT 21  ALKPHOS 58  BILITOT 1.0   No results for input(s): CHOL in the last 72 hours. No results for input(s): PROTIME in the last 72 hours.  Imaging: Imaging results have been reviewed and Dg Knee Complete 4 Views Right  05/07/2015  CLINICAL DATA:  Worsening pain and wound infection. EXAM: RIGHT KNEE - COMPLETE 4+ VIEW COMPARISON:  May 05, 2015. FINDINGS: There is no evidence of fracture, dislocation, or joint effusion. Mild  narrowing of the medial and lateral joint spaces are noted with osteophyte formation. Soft tissues are unremarkable. IMPRESSION: Mild degenerative joint disease. No acute abnormality seen in the right knee. Electronically Signed   By: Marijo Conception, M.D.   On: 05/07/2015 21:15    Cardiac Studies:  Assessment/Plan:  Cellulitis right leg associated with multiple blisters and small open wound Coronary artery disease history of PCI to LAD in remote past Hypertension Chronic atrial fibrillation with moderate ventricular response Diabetes mellitus Chronic kidney disease Anemia of chronic disease Hyperlipidemia Morbid obesity Obstructive sleep apnea Degenerative joint disease Plan Add Lasix as per orders Elevate legs 30 Surgical consult called  wound care consult Dr. Doylene Canard on call for weekend Check labs in a.m.     Charolette Forward 05/08/2015, 12:08 PM

## 2015-05-08 NOTE — Progress Notes (Signed)
Patient ID: Reginald [name], male   DOB: 09/01/1935, 79 y.o.   MRN: [id number] We have been asked to see this patient for cellulitis and blistering over his right knee.  Upon arrival, the patient states he fell on this knee several days ago.  His right knee appears to have a hematoma with some skin thinning and vesicles present.  There is no evidence of infection or cellulitis.  There is no indications for surgical intervention.  If felt needed by the primary service, he could undergo an ortho evaluation for recommendations on mobilization of his knee joint to keep it from getting stiff and to make sure they have no other recommendations since this is over his joint.  His hematoma will likely take several weeks if not longer to reabsorb.  If it keeps expanding you may want to consider holding his xarelto.  We will sign off.  Niraj Kudrna E 12:41 PM 05/08/2015

## 2015-05-08 NOTE — H&P (Signed)
[facility] History and Physical  Patient: Reginald [name]  MRN: [id number]  DOB: [birth year]  DOS: the patient was seen and examined on 05/08/2015 PCP: Charolette Forward, MD  Referring physician: Dr. Jeneen Rinks Chief Complaint: Right knee pain and swelling with blisters  HPI: Reginald [name] is a 79 y.o. male with Past medical history of atrial fibrillation on chronic anticoagulation, obstructive sleep apnea on C Pap, essential hypertension, chronic kidney disease, type 2 diabetes mellitus, chronic venous stasis. The patient is presenting with complaints of right knee pain. Patient was initially seen on 05/06/2015 due to a mechanical fall and was found to have significant soft tissue swelling with hematoma, he had an outpatient appointment with wound care on Monday and he was discharged home. After the discharge the patient continues to have difficulty ambulating due to pain at the injury site. The pain progressed involving his thigh as well as leg. He also has noted progressing ecchymosis over the leg. He started noticing blisters over last few days some of them were filled with blood. 2 of the large blisters ruptured without any pus. Patient started having difficulty ambulating even with a walker and therefore decided to come to the hospital for further assistance. He denies any fever or chills no chest pain or abdominal pain no nausea no vomiting no diarrhea no constipation no burning urination. He mentions he is compliant with all his medications. He mentions he is taking Xarelto for 6 months.  The patient is coming from home.  At his baseline ambulates with walker And is independent for most of his ADL; manages his medication on his own.  Review of Systems: as mentioned in the history of present illness.  A comprehensive review of the other systems is negative.  Past Medical History  Diagnosis Date  . Type II or unspecified type diabetes mellitus without mention of complication,  not stated as uncontrolled   . Obesity, unspecified   . Unspecified essential hypertension   . Coronary atherosclerosis of unspecified type of vessel, native or graft   . Vestibulopathy   . Hypokalemia   . Atrial fibrillation ([place])   . Rhabdomyolysis   . Hypercholesteremia   . COPD (chronic obstructive pulmonary disease) ([place])   . OSA (obstructive sleep apnea)   . Anteroseptal myocardial infarction ([place])   . Obesity hypoventilation syndrome ([place])   . Hypertensive renal disease   . Chronic kidney disease   . CHF (congestive heart failure) ([place])   . Diverticulosis   . Glaucoma    Past Surgical History  Procedure Laterality Date  . Inguinal hernia repair Right 1985  . Coronary artery bypass graft  1997    PCI to LAD  . Nose surgery  1970s    Per Dr. Terrence Dupont [facility] in pt chart  . Knee arthroscopy Left 1991  . Lumbar fusion    . Appendectomy  1985  . Cholecystectomy  06/2000   Social History:  reports that he has never smoked. He has never used smokeless tobacco. He reports that he does not drink alcohol or use illicit drugs.  No Known Allergies  Family History  Problem Relation Age of Onset  . COPD Mother   . Diabetes Father   . Diabetes Sister   . Heart disease Brother   . Breast cancer Maternal Aunt   . Diabetes Paternal Grandmother   . Colon cancer Maternal Aunt   . Ovarian cancer Daughter     Prior to Admission medications   Medication Sig Start Date  End Date Taking? Authorizing Provider  amiodarone (PACERONE) 200 MG tablet Take 200 mg by mouth at bedtime.    Yes Historical Provider, MD  atorvastatin (LIPITOR) 20 MG tablet Take 20 mg by mouth daily at 6 PM.    Yes Historical Provider, MD  cholecalciferol (VITAMIN D) 1000 UNITS tablet Take 1,000 Units by mouth daily.   Yes Historical Provider, MD  furosemide (LASIX) 40 MG tablet Take 40 mg by mouth at bedtime.    Yes Historical Provider, MD  HYDROcodone-acetaminophen (NORCO) 5-325 MG tablet Take 1-2 tablets by mouth  every 6 (six) hours as needed for moderate pain or severe pain. 05/06/15  Yes Harvel Quale, MD  Lactobacillus (ACIDOPHILUS PROBIOTIC) 10 MG TABS Take 10 mg by mouth 3 (three) times daily. Patient taking differently: Take 10 mg by mouth at bedtime.  10/31/14  Yes Waynetta Pean, PA-C  latanoprost (XALATAN) 0.005 % ophthalmic solution Place 1 drop into both eyes at bedtime. 02/10/14  Yes Historical Provider, MD  lisinopril (PRINIVIL,ZESTRIL) 40 MG tablet Take 40 mg by mouth 2 (two) times daily. 06/23/14  Yes Historical Provider, MD  nitroGLYCERIN (NITROSTAT) 0.4 MG SL tablet Place 0.4 mg under the tongue every 5 (five) minutes as needed for chest pain.   Yes Historical Provider, MD  potassium chloride SA (K-DUR,KLOR-CON) 20 MEQ tablet Take 20 mEq by mouth 3 (three) times daily.     Yes Historical Provider, MD  saxagliptin HCl (ONGLYZA) 5 MG TABS tablet Take 5 mg by mouth daily.   Yes Historical Provider, MD  XARELTO 15 MG TABS tablet Take 15 mg by mouth daily with supper.  06/17/14  Yes Historical Provider, MD  clindamycin (CLEOCIN) 150 MG capsule Take 3 capsules (450 mg total) by mouth 3 (three) times daily. May dispense as 193m capsules Patient not taking: Reported on 05/07/2015 10/31/14   WWaynetta Pean PA-C    Physical Exam: Filed Vitals:   05/08/15 0000 05/08/15 0030 05/08/15 0100 05/08/15 0158  BP: 138/71 146/58 136/64 137/60  Pulse: 93 111 92 82  Temp:    97.7 F (36.5 C)  TempSrc:    Oral  Resp: _0 Height:    5' 10" (1.778 m)  Weight:    136.805 kg (301 lb 9.6 oz)  SpO2: 94% 94% 94% 93%    General: Alert, Awake and Oriented to Time, Place and Person. Appear in mild distress Eyes: PERRL ENT: Oral Mucosa clear moist. Neck: no JVD Cardiovascular: S1 and S2 Present, no Murmur, Peripheral Pulses Present Respiratory: Bilateral Air entry equal and Decreased,  Clear to Auscultation, no Crackles, no wheezes Abdomen: Bowel Sound present, Soft and no tenderness Skin: Multiple  various sized blisters filled with blood noted around the knee joint on the right Extensive ecchymosis noted around the knee joint radiating up on the inner thigh as well as calf region. Poorly healing crusted ulcer on the left knee. A small open wound on the right shin.  Extremities: Bilateral Pedal edema, no calf tenderness Neurologic: Grossly no focal neuro deficit.  Labs on Admission:  CBC:  Recent Labs Lab 05/05/15 2051 05/07/15 2237  WBC 10.1 9.2  NEUTROABS 8.0* 6.5  HGB 15.6 14.2  HCT 47.5 44.0  MCV 89.0 90.0  PLT 199 196    CMP     Component Value Date/Time   NA 140 05/07/2015 2237   K 3.8 05/07/2015 2237   CL 105 05/07/2015 2237   CO2 27 05/07/2015 2237   GLUCOSE 149* 05/07/2015  2237   BUN 18 05/07/2015 2237   CREATININE 1.29* 05/07/2015 2237   CALCIUM 9.1 05/07/2015 2237   PROT 5.6* 05/05/2015 2051   ALBUMIN 3.4* 05/05/2015 2051   AST 28 05/05/2015 2051   ALT 27 05/05/2015 2051   ALKPHOS 59 05/05/2015 2051   BILITOT 0.8 05/05/2015 2051   GFRNONAA 52* 05/07/2015 2237   GFRAA >60 05/07/2015 2237     Recent Labs Lab 05/07/15 2230  CKTOTAL 168   BNP (last 3 results) No results for input(s): BNP in the last 8760 hours.  ProBNP (last 3 results) No results for input(s): PROBNP in the last 8760 hours.   Radiological Exams on Admission: Dg Knee Complete 4 Views Right  05/07/2015  CLINICAL DATA:  Worsening pain and wound infection. EXAM: RIGHT KNEE - COMPLETE 4+ VIEW COMPARISON:  May 05, 2015. FINDINGS: There is no evidence of fracture, dislocation, or joint effusion. Mild narrowing of the medial and lateral joint spaces are noted with osteophyte formation. Soft tissues are unremarkable. IMPRESSION: Mild degenerative joint disease. No acute abnormality seen in the right knee. Electronically Signed   By: Marijo Conception, M.D.   On: 05/07/2015 21:15   Assessment/Plan 1. Cellulitis The patient is presenting with complaints of expanding ecchymosis as  well as blisterlike lesions on the right knee. Patient has warmth and tenderness of the skin. Mild leukocytosis. CPK is negative. X-ray does not show any evidence of acute bony involvement. With this the patient will be admitted in the hospital. He will be receiving IV vancomycin. We will check ESR and CRP.  2. Blisters of multiple sites. The patient is found to be having multiple blisters of the right knee. Although this could represent edema with his chronic venous stasis as well as chronic diastolic dysfunction possibility of drug reaction and rarely Stevens-Johnson syndrome cannot be ruled out. Xarelto has been implicated rarely for Stevens-Johnson's. Currently I will hold off on Xarelto. Patient will be receiving IV Lasix. Monitor for further spread of the blisters. Currently SCORTEN score is 1 which is low risk.   3  Type 2 diabetes mellitus ([place]) Placing the patient on sliding scale insulin.  4  Obesity, unspecified 5  OSA (obstructive sleep apnea) Continuing C Pap daily at bedtime.  6  Atrial fibrillation (H[facility]) Continuing amiodarone. Holding anticoagulation.  7  Chronic kidney disease Renal function stable will continue close monitoring.  Nutrition: Cardiac diet diabetic diet DVT Prophylaxis: mechanical compression device  Advance goals of care discussion: Full code  Disposition: Admitted as observation,   med surge Author: Berle Mull, MD [facility] Pager: [phone] 05/08/2015  If 7PM-7AM, please contact night-coverage [email or web address] Password [email or web address]

## 2015-05-08 NOTE — Care Management Note (Signed)
Case Management Note  Patient Details  Name: Reginald [name] MRN: [id number] Date of Birth: [birth year]  Subjective/Objective:               Date-10-28 Initial Assessment  Spoke with patient at the bedside.  Introduced self as Tourist information centre manager and explained role in discharge planning and how to be reached.  Verified patient lives in [place] in a house alone. .  Verified patient anticipates to go home alone, at time of discharge.  Patient has DME cane walker CPAP. Expressed potential need for no other DME.  Patient denied  needing help with their medication.  Patient drives to MD appointments.  Verified patient has PCP [facility] in [place]. Patient states they currently receive [place] set up from ER discharge on Tuesday.    Plan: CM will continue to follow for discharge planning and [facility] resources.   Carles Collet RN BSN CM [phone]      Action/Plan:   Expected Discharge Date:                  Expected Discharge Plan:  [place]  In-House Referral:     Discharge planning Services  CM Consult  Post Acute Care Choice:    Choice offered to:     DME Arranged:    DME Agency:     HH Arranged:    HH Agency:     Status of Service:  In process, will continue to follow  Medicare Important Message Given:    Date Medicare IM Given:    Medicare IM give by:    Date Additional Medicare IM Given:    Additional Medicare Important Message give by:     If discussed at [place] of Stay Meetings, dates discussed:    Additional Comments:  Carles Collet, RN 05/08/2015, 10:30 AM

## 2015-05-09 DIAGNOSIS — L03115 Cellulitis of right lower limb: Secondary | ICD-10-CM | POA: Diagnosis not present

## 2015-05-09 DIAGNOSIS — E1140 Type 2 diabetes mellitus with diabetic neuropathy, unspecified: Secondary | ICD-10-CM | POA: Diagnosis not present

## 2015-05-09 DIAGNOSIS — I2510 Atherosclerotic heart disease of native coronary artery without angina pectoris: Secondary | ICD-10-CM | POA: Diagnosis not present

## 2015-05-09 DIAGNOSIS — E6609 Other obesity due to excess calories: Secondary | ICD-10-CM | POA: Diagnosis not present

## 2015-05-09 LAB — BASIC METABOLIC PANEL
ANION GAP: 6 (ref 5–15)
BUN: 21 mg/dL — AB (ref 6–20)
CALCIUM: 8.5 mg/dL — AB (ref 8.9–10.3)
CHLORIDE: 102 mmol/L (ref 101–111)
CO2: 28 mmol/L (ref 22–32)
Creatinine, Ser: 1.56 mg/dL — ABNORMAL HIGH (ref 0.61–1.24)
GFR calc Af Amer: 48 mL/min — ABNORMAL LOW (ref >60)
GFR calc non Af Amer: 41 mL/min — ABNORMAL LOW (ref >60)
GLUCOSE: 157 mg/dL — AB (ref 65–99)
POTASSIUM: 3.8 mmol/L (ref 3.5–5.1)
Sodium: 136 mmol/L (ref 135–145)

## 2015-05-09 LAB — CBC
HEMATOCRIT: 39.7 % (ref 39.0–52.0)
HEMOGLOBIN: 12.8 g/dL — AB (ref 13.0–17.0)
MCH: 29.2 pg (ref 26.0–34.0)
MCHC: 32.2 g/dL (ref 30.0–36.0)
MCV: 90.6 fL (ref 78.0–100.0)
Platelets: 221 10*3/uL (ref 150–400)
RBC: 4.38 MIL/uL (ref 4.22–5.81)
RDW: 16.1 % — ABNORMAL HIGH (ref 11.5–15.5)
WBC: 11.7 10*3/uL — ABNORMAL HIGH (ref 4.0–10.5)

## 2015-05-09 LAB — GLUCOSE, CAPILLARY
Glucose-Capillary: 150 mg/dL — ABNORMAL HIGH (ref 65–99)
Glucose-Capillary: 157 mg/dL — ABNORMAL HIGH (ref 65–99)
Glucose-Capillary: 181 mg/dL — ABNORMAL HIGH (ref 65–99)
Glucose-Capillary: 142 mg/dL — ABNORMAL HIGH (ref 65–99)

## 2015-05-09 NOTE — Progress Notes (Signed)
Ref: Charolette Forward, MD   Subjective:  Feeling better. No chest pain. Right lower extremity and knee wound healing with decreased discharge. Healing left knee bruise.  Objective:  Vital Signs in the last 24 hours: Temp:  [98.9 F (37.2 C)-100 F (37.8 C)] 98.9 F (37.2 C) (10/29 2127) Pulse Rate:  [78-118] 118 (10/29 2127) Cardiac Rhythm:  [-] Atrial fibrillation (10/29 1955) Resp:  [15-19] 15 (10/29 2127) BP: (117-141)/(58-72) 141/70 mmHg (10/29 2127) SpO2:  [90 %-98 %] 91 % (10/29 2127) Weight:  [137.5 kg (303 lb 2.1 oz)] 137.5 kg (303 lb 2.1 oz) (10/29 0542)  Physical Exam: BP Readings from Last 1 Encounters:  05/09/15 141/70    Wt Readings from Last 1 Encounters:  05/09/15 137.5 kg (303 lb 2.1 oz)    Weight change: 0.06 kg (2.1 oz)  HEENT: [place]/AT, Eyes-PERL, EOMI, Conjunctiva-Pink, Sclera-Non-icteric Neck: No JVD, No bruit, Trachea midline. Lungs:  Clear, Bilateral. Cardiac:  Regular rhythm, normal S1 and S2, no S3.  Abdomen:  Soft, non-tender. Extremities:  No edema present. No cyanosis. No clubbing. Right knee and lower leg with dressing. CNS: AxOx3, Cranial nerves grossly intact, moves all 4 extremities. Right handed. Skin: Warm and dry.   Intake/Output from previous day: 10/28 0701 - 10/29 0700 In: 622 [P.O.:622] Out: 2200 [Urine:2200]    Lab Results: BMET    Component Value Date/Time   NA 136 05/09/2015 0610   NA 141 05/08/2015 0435   NA 140 05/07/2015 2237   K 3.8 05/09/2015 0610   K 3.9 05/08/2015 0435   K 3.8 05/07/2015 2237   CL 102 05/09/2015 0610   CL 105 05/08/2015 0435   CL 105 05/07/2015 2237   CO2 28 05/09/2015 0610   CO2 30 05/08/2015 0435   CO2 27 05/07/2015 2237   GLUCOSE 157* 05/09/2015 0610   GLUCOSE 138* 05/08/2015 0435   GLUCOSE 149* 05/07/2015 2237   BUN 21* 05/09/2015 0610   BUN 17 05/08/2015 0435   BUN 18 05/07/2015 2237   CREATININE 1.56* 05/09/2015 0610   CREATININE 1.46* 05/08/2015 0435   CREATININE 1.29* 05/07/2015 2237    CALCIUM 8.5* 05/09/2015 0610   CALCIUM 8.7* 05/08/2015 0435   CALCIUM 9.1 05/07/2015 2237   GFRNONAA 41* 05/09/2015 0610   GFRNONAA 45* 05/08/2015 0435   GFRNONAA 52* 05/07/2015 2237   GFRAA 48* 05/09/2015 0610   GFRAA 52* 05/08/2015 0435   GFRAA >60 05/07/2015 2237   CBC    Component Value Date/Time   WBC 11.7* 05/09/2015 0610   RBC 4.38 05/09/2015 0610   HGB 12.8* 05/09/2015 0610   HCT 39.7 05/09/2015 0610   PLT 221 05/09/2015 0610   MCV 90.6 05/09/2015 0610   MCH 29.2 05/09/2015 0610   MCHC 32.2 05/09/2015 0610   RDW 16.1* 05/09/2015 0610   LYMPHSABS 1.4 05/07/2015 2237   MONOABS 1.2* 05/07/2015 2237   EOSABS 0.2 05/07/2015 2237   BASOSABS 0.0 05/07/2015 2237   HEPATIC Function Panel  Recent Labs  10/31/14 1340 05/05/15 2051 05/08/15 0435  PROT 6.0 5.6* 5.5*   HEMOGLOBIN A1C No components found for: HGA1C,  MPG CARDIAC ENZYMES Lab Results  Component Value Date   CKTOTAL 168 05/07/2015   CKMB 4.4* 08/12/2010   TROPONINI 0.03        NO INDICATION OF MYOCARDIAL INJURY. 08/12/2010   TROPONINI 0.04        NO INDICATION OF MYOCARDIAL INJURY. 08/12/2010   TROPONINI 0.05        NO INDICATION  OF MYOCARDIAL INJURY. 08/11/2010   BNP No results for input(s): PROBNP in the last 8760 hours. TSH No results for input(s): TSH in the last 8760 hours. CHOLESTEROL No results for input(s): CHOL in the last 8760 hours.  Scheduled Meds: . acidophilus  1 capsule Oral QHS  . amiodarone  200 mg Oral QHS  . atorvastatin  20 mg Oral q1800  . insulin aspart  0-15 Units Subcutaneous TID WC  . insulin aspart  0-5 Units Subcutaneous QHS  . latanoprost  1 drop Both Eyes QHS  . metoprolol tartrate  25 mg Oral BID  . sodium chloride  3 mL Intravenous Q12H  . vancomycin  1,000 mg Intravenous Q12H   Continuous Infusions:  PRN Meds:.acetaminophen **OR** acetaminophen, HYDROcodone-acetaminophen, ondansetron **OR** ondansetron (ZOFRAN) IV,  senna-docusate  Assessment/Plan: Cellulitis right leg associated with multiple blisters and small open wound Coronary artery disease history of PCI to LAD in remote past Hypertension Chronic atrial fibrillation with moderate ventricular response Diabetes mellitus Chronic kidney disease Anemia of chronic disease Hyperlipidemia Morbid obesity Obstructive sleep apnea Degenerative joint disease  Continue medical treatment.        Dixie Dials  MD  05/09/2015, 11:56 PM

## 2015-05-09 NOTE — Evaluation (Addendum)
Physical Therapy Evaluation Patient Details Name: Reginald [name] MRN: [id number] DOB: 05/19/1936 Today's Date: 05/09/2015   History of Present Illness  Pt admitted s/p fall with progressive swelling and blistering lower legs.  He is a 79 y.o. male with Past medical history of atrial fibrillation on chronic anticoagulation, obstructive sleep apnea on C Pap, essential hypertension, chronic kidney disease, type 2 diabetes mellitus, chronic venous stasis.  Clinical Impression  Patient unable to perform standing tasks today.  He demo some difficulty following commands today which does not appear to be baseline cognition.  Patient's girlfriend and his son were present during PT eval.  Recommend mechanical lift for all nursing transfers at this time secondary to fall risk.  Rt lower leg had moderate amount of sanguinous drainage after basic mobility. Will continue to follow patient while on this venue of care to progress mobility.    Follow Up Recommendations SNF;Supervision/Assistance - 24 hour    Equipment Recommendations  3in1 (PT)    Recommendations for Other Services       Precautions / Restrictions Precautions Precautions: Fall;Other (comment) (weeping Rt lower leg wound) Precaution Comments: prior fall at home while reaching for item off lower surface Restrictions Weight Bearing Restrictions: No      Mobility  Bed Mobility Overal bed mobility: +2 for physical assistance             General bed mobility comments: +2 to scoot to [facility] with trendelenberg position, max assist sit to/from supine, total assist to roll Rt/Lt with rail (max assist with elevated HOB to lean anteriorly for pillow)  Transfers Overall transfer level: Needs assistance Equipment used: Rolling walker (2 wheeled) Transfers: Sit to/from Stand Sit to Stand: +2 physical assistance;From elevated surface         General transfer comment: unable to come fully into standing, decr hip clearance from  bed  Ambulation/Gait                Stairs            Wheelchair Mobility    Modified Rankin (Stroke Patients Only)       Balance Overall balance assessment: Needs assistance Sitting-balance support: Bilateral upper extremity supported;Feet supported Sitting balance-Leahy Scale: Fair Sitting balance - Comments: posterior lean Postural control: Posterior lean Standing balance support: Bilateral upper extremity supported Standing balance-Leahy Scale: Zero                               Pertinent Vitals/Pain Pain Assessment: 0-10 Pain Score: 3  Pain Location: left foot/lower leg, Rt knee during mobility Pain Descriptors / Indicators: Grimacing;Sore Pain Intervention(s): Limited activity within patient's tolerance;Monitored during session;Repositioned    Home Living Family/patient expects to be discharged to:: Private residence Living Arrangements: Alone Available Help at Discharge: Friend(s) (girlfriend of 16 years lives in [place]) Type of Home: House Home Access: Stairs to enter Entrance Stairs-Rails: Can reach both Entrance Stairs-Number of Steps: 2 Home Layout: One level Home Equipment: Environmental consultant - 2 wheels;Cane - single point Additional Comments: began using RW "some time ago", uses regular cart at store    Prior Function Level of Independence: Independent with assistive device(s)               Hand Dominance        Extremity/Trunk Assessment   Upper Extremity Assessment: Generalized weakness           Lower Extremity Assessment: Generalized weakness (unable to lift  leg in bed without assistance, grossly 2+/5)      Cervical / Trunk Assessment: Kyphotic  Communication   Communication: No difficulties  Cognition Arousal/Alertness: Awake/alert Behavior During Therapy: WFL for tasks assessed/performed Overall Cognitive Status: Within Functional Limits for tasks assessed (oriented to place/situation - difficulty with  commands)                      General Comments General comments (skin integrity, edema, etc.): sanguinous drainage that soaked dressing after standing (swelling noted into thigh region Rt, rubot left lower leg)    Exercises Other Exercises Other Exercises: ankle pumps in sitting x 10 reps bil Other Exercises: LAQ in sitting x 10 reps bil partial range Other Exercises: hip flexion in sitting partial range x 10 reps bil      Assessment/Plan    PT Assessment Patient needs continued PT services  PT Diagnosis Difficulty walking;Generalized weakness;Acute pain   PT Problem List Decreased strength;Decreased activity tolerance;Decreased balance;Decreased mobility;Decreased knowledge of use of DME;Decreased safety awareness;Decreased knowledge of precautions;Pain;Decreased skin integrity  PT Treatment Interventions DME instruction;Gait training;Stair training;Functional mobility training;Therapeutic activities;Therapeutic exercise;Balance training;Neuromuscular re-education;Patient/family education   PT Goals (Current goals can be found in the Care Plan section) Acute Rehab PT Goals Patient Stated Goal: get stronger PT Goal Formulation: With patient/family Time For Goal Achievement: 05/23/15 Potential to Achieve Goals: Fair    Frequency Min 3X/week   Barriers to discharge Decreased caregiver support girlfriend may not be able to provide much physical assist    Co-evaluation               End of Session Equipment Utilized During Treatment: Gait belt Activity Tolerance: Patient limited by pain;Patient limited by lethargy Patient left: in bed;with call bell/phone within reach;with bed alarm set;with family/visitor present Nurse Communication: Mobility status;Need for lift equipment    Functional Assessment Tool Used: bed mobility, transfers Functional Limitation: Mobility: Walking and moving around Mobility: Walking and Moving Around Current Status [phone]): At least 80  percent but less than 100 percent impaired, limited or restricted Mobility: Walking and Moving Around Goal Status [phone]): At least 40 percent but less than 60 percent impaired, limited or restricted    Time: [id number] PT Time Calculation (min) (ACUTE ONLY): 45 min   Charges:   PT Evaluation $Initial PT Evaluation Tier I: 1 Procedure PT Treatments $Therapeutic Exercise: 8-22 mins $Therapeutic Activity: 8-22 mins   PT G Codes:   PT G-Codes **NOT FOR INPATIENT CLASS** Functional Assessment Tool Used: bed mobility, transfers Functional Limitation: Mobility: Walking and moving around Mobility: Walking and Moving Around Current Status [id number]): At least 80 percent but less than 100 percent impaired, limited or restricted Mobility: Walking and Moving Around Goal Status [phone]): At least 40 percent but less than 60 percent impaired, limited or restricted   Malka So, [place]  [place] 05/09/2015, 6:48 PM

## 2015-05-10 DIAGNOSIS — E872 Acidosis: Secondary | ICD-10-CM | POA: Diagnosis present

## 2015-05-10 DIAGNOSIS — M25569 Pain in unspecified knee: Secondary | ICD-10-CM | POA: Diagnosis not present

## 2015-05-10 DIAGNOSIS — S8002XA Contusion of left knee, initial encounter: Secondary | ICD-10-CM | POA: Diagnosis present

## 2015-05-10 DIAGNOSIS — H409 Unspecified glaucoma: Secondary | ICD-10-CM | POA: Diagnosis present

## 2015-05-10 DIAGNOSIS — Z7901 Long term (current) use of anticoagulants: Secondary | ICD-10-CM | POA: Diagnosis not present

## 2015-05-10 DIAGNOSIS — N390 Urinary tract infection, site not specified: Secondary | ICD-10-CM | POA: Diagnosis present

## 2015-05-10 DIAGNOSIS — D649 Anemia, unspecified: Secondary | ICD-10-CM | POA: Diagnosis not present

## 2015-05-10 DIAGNOSIS — N400 Enlarged prostate without lower urinary tract symptoms: Secondary | ICD-10-CM | POA: Diagnosis present

## 2015-05-10 DIAGNOSIS — R2681 Unsteadiness on feet: Secondary | ICD-10-CM | POA: Diagnosis not present

## 2015-05-10 DIAGNOSIS — E6609 Other obesity due to excess calories: Secondary | ICD-10-CM | POA: Diagnosis not present

## 2015-05-10 DIAGNOSIS — Z6841 Body Mass Index (BMI) 40.0 and over, adult: Secondary | ICD-10-CM | POA: Diagnosis not present

## 2015-05-10 DIAGNOSIS — Z981 Arthrodesis status: Secondary | ICD-10-CM | POA: Diagnosis not present

## 2015-05-10 DIAGNOSIS — I252 Old myocardial infarction: Secondary | ICD-10-CM | POA: Diagnosis not present

## 2015-05-10 DIAGNOSIS — Z79899 Other long term (current) drug therapy: Secondary | ICD-10-CM | POA: Diagnosis not present

## 2015-05-10 DIAGNOSIS — I129 Hypertensive chronic kidney disease with stage 1 through stage 4 chronic kidney disease, or unspecified chronic kidney disease: Secondary | ICD-10-CM | POA: Diagnosis present

## 2015-05-10 DIAGNOSIS — L89152 Pressure ulcer of sacral region, stage 2: Secondary | ICD-10-CM | POA: Diagnosis present

## 2015-05-10 DIAGNOSIS — R6889 Other general symptoms and signs: Secondary | ICD-10-CM | POA: Diagnosis not present

## 2015-05-10 DIAGNOSIS — Z951 Presence of aortocoronary bypass graft: Secondary | ICD-10-CM | POA: Diagnosis not present

## 2015-05-10 DIAGNOSIS — E876 Hypokalemia: Secondary | ICD-10-CM | POA: Diagnosis not present

## 2015-05-10 DIAGNOSIS — Z9181 History of falling: Secondary | ICD-10-CM | POA: Diagnosis not present

## 2015-05-10 DIAGNOSIS — E1122 Type 2 diabetes mellitus with diabetic chronic kidney disease: Secondary | ICD-10-CM | POA: Diagnosis present

## 2015-05-10 DIAGNOSIS — K5900 Constipation, unspecified: Secondary | ICD-10-CM | POA: Diagnosis present

## 2015-05-10 DIAGNOSIS — W19XXXA Unspecified fall, initial encounter: Secondary | ICD-10-CM | POA: Diagnosis present

## 2015-05-10 DIAGNOSIS — N183 Chronic kidney disease, stage 3 (moderate): Secondary | ICD-10-CM | POA: Diagnosis present

## 2015-05-10 DIAGNOSIS — J449 Chronic obstructive pulmonary disease, unspecified: Secondary | ICD-10-CM | POA: Diagnosis not present

## 2015-05-10 DIAGNOSIS — R238 Other skin changes: Secondary | ICD-10-CM | POA: Diagnosis present

## 2015-05-10 DIAGNOSIS — N179 Acute kidney failure, unspecified: Secondary | ICD-10-CM | POA: Diagnosis not present

## 2015-05-10 DIAGNOSIS — G4733 Obstructive sleep apnea (adult) (pediatric): Secondary | ICD-10-CM | POA: Diagnosis present

## 2015-05-10 DIAGNOSIS — I482 Chronic atrial fibrillation: Secondary | ICD-10-CM | POA: Diagnosis present

## 2015-05-10 DIAGNOSIS — L0390 Cellulitis, unspecified: Secondary | ICD-10-CM | POA: Diagnosis not present

## 2015-05-10 DIAGNOSIS — M1990 Unspecified osteoarthritis, unspecified site: Secondary | ICD-10-CM | POA: Diagnosis present

## 2015-05-10 DIAGNOSIS — S81801A Unspecified open wound, right lower leg, initial encounter: Secondary | ICD-10-CM | POA: Diagnosis present

## 2015-05-10 DIAGNOSIS — E1129 Type 2 diabetes mellitus with other diabetic kidney complication: Secondary | ICD-10-CM | POA: Diagnosis not present

## 2015-05-10 DIAGNOSIS — S8001XA Contusion of right knee, initial encounter: Secondary | ICD-10-CM | POA: Diagnosis present

## 2015-05-10 DIAGNOSIS — S80221D Blister (nonthermal), right knee, subsequent encounter: Secondary | ICD-10-CM | POA: Diagnosis not present

## 2015-05-10 DIAGNOSIS — E785 Hyperlipidemia, unspecified: Secondary | ICD-10-CM | POA: Diagnosis present

## 2015-05-10 DIAGNOSIS — Z955 Presence of coronary angioplasty implant and graft: Secondary | ICD-10-CM | POA: Diagnosis not present

## 2015-05-10 DIAGNOSIS — I509 Heart failure, unspecified: Secondary | ICD-10-CM | POA: Diagnosis not present

## 2015-05-10 DIAGNOSIS — I120 Hypertensive chronic kidney disease with stage 5 chronic kidney disease or end stage renal disease: Secondary | ICD-10-CM | POA: Diagnosis not present

## 2015-05-10 DIAGNOSIS — D638 Anemia in other chronic diseases classified elsewhere: Secondary | ICD-10-CM | POA: Diagnosis present

## 2015-05-10 DIAGNOSIS — M6281 Muscle weakness (generalized): Secondary | ICD-10-CM | POA: Diagnosis not present

## 2015-05-10 DIAGNOSIS — Z794 Long term (current) use of insulin: Secondary | ICD-10-CM | POA: Diagnosis not present

## 2015-05-10 DIAGNOSIS — I2510 Atherosclerotic heart disease of native coronary artery without angina pectoris: Secondary | ICD-10-CM | POA: Diagnosis not present

## 2015-05-10 DIAGNOSIS — N1330 Unspecified hydronephrosis: Secondary | ICD-10-CM | POA: Diagnosis present

## 2015-05-10 DIAGNOSIS — E1140 Type 2 diabetes mellitus with diabetic neuropathy, unspecified: Secondary | ICD-10-CM | POA: Diagnosis not present

## 2015-05-10 DIAGNOSIS — Z7984 Long term (current) use of oral hypoglycemic drugs: Secondary | ICD-10-CM | POA: Diagnosis not present

## 2015-05-10 DIAGNOSIS — N170 Acute kidney failure with tubular necrosis: Secondary | ICD-10-CM | POA: Diagnosis present

## 2015-05-10 DIAGNOSIS — L03115 Cellulitis of right lower limb: Secondary | ICD-10-CM | POA: Diagnosis not present

## 2015-05-10 LAB — BASIC METABOLIC PANEL
ANION GAP: 8 (ref 5–15)
BUN: 22 mg/dL — ABNORMAL HIGH (ref 6–20)
CHLORIDE: 101 mmol/L (ref 101–111)
CO2: 25 mmol/L (ref 22–32)
Calcium: 8.7 mg/dL — ABNORMAL LOW (ref 8.9–10.3)
Creatinine, Ser: 1.45 mg/dL — ABNORMAL HIGH (ref 0.61–1.24)
GFR calc non Af Amer: 45 mL/min — ABNORMAL LOW (ref >60)
GFR, EST AFRICAN AMERICAN: 52 mL/min — AB (ref >60)
GLUCOSE: 167 mg/dL — AB (ref 65–99)
Potassium: 3.7 mmol/L (ref 3.5–5.1)
Sodium: 134 mmol/L — ABNORMAL LOW (ref 135–145)

## 2015-05-10 LAB — GLUCOSE, CAPILLARY
GLUCOSE-CAPILLARY: 167 mg/dL — AB (ref 65–99)
Glucose-Capillary: 160 mg/dL — ABNORMAL HIGH (ref 65–99)
Glucose-Capillary: 161 mg/dL — ABNORMAL HIGH (ref 65–99)
Glucose-Capillary: 156 mg/dL — ABNORMAL HIGH (ref 65–99)

## 2015-05-10 LAB — VANCOMYCIN, TROUGH: VANCOMYCIN TR: 12 ug/mL (ref 10.0–20.0)

## 2015-05-10 MED ORDER — METOPROLOL TARTRATE 50 MG PO TABS
50.0000 mg | ORAL_TABLET | Freq: Two times a day (BID) | ORAL | Status: DC
  Administered 2015-05-10 – 2015-05-13 (×7): 50 mg via ORAL
  Filled 2015-05-10 (×9): qty 1

## 2015-05-10 NOTE — Progress Notes (Signed)
ANTIBIOTIC CONSULT NOTE - FOLLOW UP  Pharmacy Consult for Vancomycin Indication: wound infection  No Known Allergies  Patient Measurements: Height: 5\' 10"  (177.8 cm) Weight: (!) 301 lb 9.4 oz (136.8 kg) IBW/kg (Calculated) : 73  Vital Signs: Temp: 98.6 F (37 C) (10/30 0506) Temp Source: Oral (10/30 0506) BP: 136/79 mmHg (10/30 0506) Pulse Rate: 118 (10/30 0506) Intake/Output from previous day: 10/29 0701 - 10/30 0700 In: 440 [P.O.:440] Out: 500 [Urine:500] Intake/Output from this shift:    Labs:  Recent Labs  05/07/15 2237 05/08/15 0435 05/09/15 0610 05/10/15 0612  WBC 9.2 8.2 11.7*  --   HGB 14.2 12.9* 12.8*  --   PLT 196 195 221  --   CREATININE 1.29* 1.46* 1.56* 1.45*   Estimated Creatinine Clearance: 59.4 mL/min (by C-G formula based on Cr of 1.45). No results for input(s): VANCOTROUGH, VANCOPEAK, VANCORANDOM, GENTTROUGH, GENTPEAK, GENTRANDOM, TOBRATROUGH, TOBRAPEAK, TOBRARND, AMIKACINPEAK, AMIKACINTROU, AMIKACIN in the last 72 hours.   Microbiology: Recent Results (from the past 720 hour(s))  Culture, blood (routine x 2)     Status: None (Preliminary result)   Collection Time: 05/07/15 10:30 PM  Result Value Ref Range Status   Specimen Description BLOOD LEFT ANTECUBITAL  Final   Special Requests   Final    BOTTLES DRAWN AEROBIC AND ANAEROBIC BLUE 3CC, RED [place]   Culture NO GROWTH 2 DAYS  Final   Report Status PENDING  Incomplete  Culture, blood (routine x 2)     Status: None (Preliminary result)   Collection Time: 05/07/15 10:37 PM  Result Value Ref Range Status   Specimen Description BLOOD RIGHT HAND  Final   Special Requests   Final    BOTTLES DRAWN AEROBIC AND ANAEROBIC BLUE 5CC, RED 4CC   Culture NO GROWTH 3 DAYS  Final   Report Status PENDING  Incomplete    Anti-infectives    Start     Dose/Rate Route Frequency Ordered Stop   05/09/15 1100  vancomycin (VANCOCIN) IVPB 1000 mg/200 mL premix     1,000 mg 200 mL/hr over 60 Minutes Intravenous  Every 12 hours 05/08/15 0135     05/07/15 2200  vancomycin (VANCOCIN) 2,000 mg in sodium chloride 0.9 % 500 mL IVPB     2,000 mg 250 mL/hr over 120 Minutes Intravenous  Once 05/07/15 2157 05/08/15 0054      Assessment: 79 y/o M with increased r knee pain and swelling. Pharmacy is consulted to dose vancomycin for wound infection. CrCl ~ 60 mL/min, SCr 1.45, afeb, WBC 11.7. Creatinine has been fluctuating but is near baseline now. No growth to date in cultures.   Goal of Therapy:  Vancomycin trough level 10-15 mcg/ml  Plan:  Continue Vanc IV 1g q 12h F/u C&S, renal function Will obtain trough prior to 4th dose at steady state  Angela Burke, PharmD Pharmacy Resident Pager: [phone] 05/10/2015,9:58 AM

## 2015-05-10 NOTE — Progress Notes (Signed)
ANTIBIOTIC CONSULT NOTE - FOLLOW UP  Pharmacy Consult for Vancomycin Indication: wound infection  No Known Allergies  Patient Measurements: Height: 5\' 10"  (177.8 cm) Weight: (!) 301 lb 9.4 oz (136.8 kg) IBW/kg (Calculated) : 73  Vital Signs: Temp: 100.5 F (38.1 C) (10/30 2122) Temp Source: Oral (10/30 2122) BP: 148/74 mmHg (10/30 2122) Pulse Rate: 68 (10/30 2122) Intake/Output from previous day: 10/29 0701 - 10/30 0700 In: 440 [P.O.:440] Out: 500 [Urine:500] Intake/Output from this shift:    Labs:  Recent Labs  05/08/15 0435 05/09/15 0610 05/10/15 0612  WBC 8.2 11.7*  --   HGB 12.9* 12.8*  --   PLT 195 221  --   CREATININE 1.46* 1.56* 1.45*   Estimated Creatinine Clearance: 59.4 mL/min (by C-G formula based on Cr of 1.45).  Recent Labs  05/10/15 2209  [facility] 12     Microbiology: Recent Results (from the past 720 hour(s))  Culture, blood (routine x 2)     Status: None (Preliminary result)   Collection Time: 05/07/15 10:30 PM  Result Value Ref Range Status   Specimen Description BLOOD LEFT ANTECUBITAL  Final   Special Requests   Final    BOTTLES DRAWN AEROBIC AND ANAEROBIC BLUE 3CC, RED [place]   Culture NO GROWTH 2 DAYS  Final   Report Status PENDING  Incomplete  Culture, blood (routine x 2)     Status: None (Preliminary result)   Collection Time: 05/07/15 10:37 PM  Result Value Ref Range Status   Specimen Description BLOOD RIGHT HAND  Final   Special Requests   Final    BOTTLES DRAWN AEROBIC AND ANAEROBIC BLUE 5CC, RED 4CC   Culture NO GROWTH 3 DAYS  Final   Report Status PENDING  Incomplete    Anti-infectives    Start     Dose/Rate Route Frequency Ordered Stop   05/09/15 1100  vancomycin (VANCOCIN) IVPB 1000 mg/200 mL premix     1,000 mg 200 mL/hr over 60 Minutes Intravenous Every 12 hours 05/08/15 0135     05/07/15 2200  vancomycin (VANCOCIN) 2,000 mg in sodium chloride 0.9 % 500 mL IVPB     2,000 mg 250 mL/hr over 120 Minutes Intravenous   Once 05/07/15 2157 05/08/15 0054      Assessment: 79 y/o M with increased r knee pain and swelling. Pharmacy is consulted to dose vancomycin for wound infection. CrCl ~ 60 mL/min, SCr 1.45, afeb, WBC 11.7. Creatinine has been fluctuating but is near baseline now. No growth to date in cultures. Vanc trough is therapeutic.  Goal of Therapy:  Vancomycin trough level 10-15 mcg/ml  Plan:  Continue Vanc IV 1g q 12h F/u C&S, renal function     Hughes Better, PharmD, BCPS Clinical Pharmacist Pager: [phone] 05/10/2015 10:43 PM

## 2015-05-10 NOTE — Progress Notes (Signed)
Ref: Charolette Forward, MD   Subjective:  Right lower extremity wound with significant drainage. Left knee wound is smaller with no significant drainage. T max 100.5 degree F.  Objective:  Vital Signs in the last 24 hours: Temp:  [98.6 F (37 C)-100.5 F (38.1 C)] 100.5 F (38.1 C) (10/30 2122) Pulse Rate:  [68-118] 68 (10/30 2122) Cardiac Rhythm:  [-] Atrial fibrillation (10/30 2137) Resp:  [17-19] 18 (10/30 2122) BP: (136-174)/(74-81) 148/74 mmHg (10/30 2122) SpO2:  [90 %-95 %] 90 % (10/30 2122) Weight:  [136.8 kg (301 lb 9.4 oz)] 136.8 kg (301 lb 9.4 oz) (10/30 0506)  Physical Exam: BP Readings from Last 1 Encounters:  05/10/15 148/74    Wt Readings from Last 1 Encounters:  05/10/15 136.8 kg (301 lb 9.4 oz)    Weight change: -0.7 kg (-1 lb 8.7 oz)  HEENT: [place]/AT, Eyes-Brown, PERL, EOMI, Conjunctiva-Pink, Sclera-Non-icteric Neck: No JVD, No bruit, Trachea midline. Lungs:  Clear, Bilateral. Cardiac:  Regular rhythm, normal S1 and S2, no S3.  Abdomen:  Soft, non-tender. Extremities:  No edema present. No cyanosis. No clubbing. Right knee dressing with discolored drainage over large area of dressing. CNS: AxOx3, Cranial nerves grossly intact, moves all 4 extremities. Right handed. Skin: Warm and dry.   Intake/Output from previous day: 10/29 0701 - 10/30 0700 In: 440 [P.O.:440] Out: 500 [Urine:500]    Lab Results: BMET    Component Value Date/Time   NA 134* 05/10/2015 0612   NA 136 05/09/2015 0610   NA 141 05/08/2015 0435   K 3.7 05/10/2015 0612   K 3.8 05/09/2015 0610   K 3.9 05/08/2015 0435   CL 101 05/10/2015 0612   CL 102 05/09/2015 0610   CL 105 05/08/2015 0435   CO2 25 05/10/2015 0612   CO2 28 05/09/2015 0610   CO2 30 05/08/2015 0435   GLUCOSE 167* 05/10/2015 0612   GLUCOSE 157* 05/09/2015 0610   GLUCOSE 138* 05/08/2015 0435   BUN 22* 05/10/2015 0612   BUN 21* 05/09/2015 0610   BUN 17 05/08/2015 0435   CREATININE 1.45* 05/10/2015 0612   CREATININE  1.56* 05/09/2015 0610   CREATININE 1.46* 05/08/2015 0435   CALCIUM 8.7* 05/10/2015 0612   CALCIUM 8.5* 05/09/2015 0610   CALCIUM 8.7* 05/08/2015 0435   GFRNONAA 45* 05/10/2015 0612   GFRNONAA 41* 05/09/2015 0610   GFRNONAA 45* 05/08/2015 0435   GFRAA 52* 05/10/2015 0612   GFRAA 48* 05/09/2015 0610   GFRAA 52* 05/08/2015 0435   CBC    Component Value Date/Time   WBC 11.7* 05/09/2015 0610   RBC 4.38 05/09/2015 0610   HGB 12.8* 05/09/2015 0610   HCT 39.7 05/09/2015 0610   PLT 221 05/09/2015 0610   MCV 90.6 05/09/2015 0610   MCH 29.2 05/09/2015 0610   MCHC 32.2 05/09/2015 0610   RDW 16.1* 05/09/2015 0610   LYMPHSABS 1.4 05/07/2015 2237   MONOABS 1.2* 05/07/2015 2237   EOSABS 0.2 05/07/2015 2237   BASOSABS 0.0 05/07/2015 2237   HEPATIC Function Panel  Recent Labs  10/31/14 1340 05/05/15 2051 05/08/15 0435  PROT 6.0 5.6* 5.5*   HEMOGLOBIN A1C No components found for: HGA1C,  MPG CARDIAC ENZYMES Lab Results  Component Value Date   CKTOTAL 168 05/07/2015   CKMB 4.4* 08/12/2010   TROPONINI 0.03        NO INDICATION OF MYOCARDIAL INJURY. 08/12/2010   TROPONINI 0.04        NO INDICATION OF MYOCARDIAL INJURY. 08/12/2010   TROPONINI 0.05  NO INDICATION OF MYOCARDIAL INJURY. 08/11/2010   BNP No results for input(s): PROBNP in the last 8760 hours. TSH No results for input(s): TSH in the last 8760 hours. CHOLESTEROL No results for input(s): CHOL in the last 8760 hours.  Scheduled Meds: . acidophilus  1 capsule Oral QHS  . amiodarone  200 mg Oral QHS  . atorvastatin  20 mg Oral q1800  . insulin aspart  0-15 Units Subcutaneous TID WC  . insulin aspart  0-5 Units Subcutaneous QHS  . latanoprost  1 drop Both Eyes QHS  . metoprolol tartrate  50 mg Oral BID  . sodium chloride  3 mL Intravenous Q12H  . vancomycin  1,000 mg Intravenous Q12H   Continuous Infusions:  PRN Meds:.acetaminophen **OR** acetaminophen, HYDROcodone-acetaminophen, ondansetron **OR**  ondansetron (ZOFRAN) IV, senna-docusate  Assessment/Plan: Cellulitis right leg associated with multiple blisters and small open wound Coronary artery disease history of PCI to LAD in remote past Hypertension Chronic atrial fibrillation with moderate ventricular response Diabetes mellitus Chronic kidney disease Anemia of chronic disease Hyperlipidemia Morbid obesity Obstructive sleep apnea Degenerative joint disease  Continue IV antibiotics.     LOS: 0 days    Dixie Dials  MD  05/10/2015, 10:50 PM

## 2015-05-10 NOTE — Evaluation (Signed)
Occupational Therapy Evaluation Patient Details Name: Reginald [name] MRN: [id number] DOB: [birth year] Today's Date: 05/10/2015    History of Present Illness Pt admitted s/p fall with progressive swelling and blistering lower legs.  He is a 79 y.o. male with Past medical history of atrial fibrillation on chronic anticoagulation, obstructive sleep apnea on C Pap, essential hypertension, chronic kidney disease, type 2 diabetes mellitus, chronic venous stasis.   Clinical Impression   Pt admitted with cellulitis. Pt currently with functional limitations due to the deficits listed below (see OT Problem List).  Pt will benefit from skilled OT to increase their safety and independence with ADL and functional mobility for ADL to facilitate discharge to venue listed below.      Follow Up Recommendations  SNF    Equipment Recommendations  None recommended by OT       Precautions / Restrictions Precautions Precautions: Fall;Other (comment) (weeping Rt lower leg wound) Precaution Comments: prior fall at home while reaching for item off lower surface Restrictions Weight Bearing Restrictions: No      Mobility Bed Mobility Overal bed mobility: Needs Assistance Bed Mobility: Rolling Rolling: Max assist         General bed mobility comments: flat bed, using rail and 2 person A  Transfers      NT                     ADL Overall ADL's : Needs assistance/impaired                         Toilet Transfer: +2 for physical assistance;Total assistance Toilet Transfer Details (indicate cue type and reason): bed pan placement Toileting- Clothing Manipulation and Hygiene: Total assistance Toileting - Clothing Manipulation Details (indicate cue type and reason): bed pan       General ADL Comments: limited eval to bed pan placement.  Pt 2 person A for this task.  Agreeable to SNF               Pertinent Vitals/Pain Pain Score: 7  Pain Location: R lower leg Pain  Descriptors / Indicators: Sore Pain Intervention(s): Limited activity within patient's tolerance;Monitored during session;Repositioned        Extremity/Trunk Assessment Upper Extremity Assessment Upper Extremity Assessment: Generalized weakness           Communication Communication Communication: No difficulties   Cognition Arousal/Alertness: Awake/alert Behavior During Therapy: WFL for tasks assessed/performed Overall Cognitive Status: Within Functional Limits for tasks assessed (oriented to place/situation - difficulty with commands)                                Home Living Family/patient expects to be discharged to:: Private residence Living Arrangements: Alone Available Help at Discharge: Friend(s) (girlfriend of 16 years lives in [place]) Type of Home: House Home Access: Stairs to enter Technical brewer of Steps: 2 Entrance Stairs-Rails: Can reach both Home Layout: One level               Home Equipment: Environmental consultant - 2 wheels;Cane - single point   Additional Comments: began using RW "some time ago", uses regular cart at store      Prior Functioning/Environment Level of Independence: Independent with assistive device(s)             OT Diagnosis: Generalized weakness;Acute pain   OT Problem List: Decreased strength;Decreased activity tolerance;Pain   OT Treatment/Interventions: Self-care/ADL  training;DME and/or AE instruction;Patient/family education    OT Goals(Current goals can be found in the care plan section) Acute Rehab OT Goals Patient Stated Goal: get stronger OT Goal Formulation: With patient Time For Goal Achievement: 05/24/15 Potential to Achieve Goals: Good  OT Frequency: Min 2X/week   Barriers to D/C: Decreased caregiver support             End of Session Nurse Communication: Mobility status  Activity Tolerance: Patient tolerated treatment well Patient left: in bed;Other (comment) (on bedpan with RN)   Time:  [id number] OT Time Calculation (min): 14 min Charges:  OT General Charges $OT Visit: 1 Procedure OT Evaluation $Initial OT Evaluation Tier I: 1 Procedure G-Codes:    Payton Mccallum D 2015/05/23, 2:50 PM

## 2015-05-11 ENCOUNTER — Encounter (HOSPITAL_BASED_OUTPATIENT_CLINIC_OR_DEPARTMENT_OTHER): Payer: Medicare Other | Attending: Plastic Surgery

## 2015-05-11 LAB — GLUCOSE, CAPILLARY
GLUCOSE-CAPILLARY: 234 mg/dL — AB (ref 65–99)
GLUCOSE-CAPILLARY: 205 mg/dL — AB (ref 65–99)
Glucose-Capillary: 153 mg/dL — ABNORMAL HIGH (ref 65–99)
Glucose-Capillary: 280 mg/dL — ABNORMAL HIGH (ref 65–99)

## 2015-05-11 MED ORDER — ADULT MULTIVITAMIN W/MINERALS CH
1.0000 | ORAL_TABLET | Freq: Every day | ORAL | Status: DC
  Administered 2015-05-11 – 2015-05-18 (×8): 1 via ORAL
  Filled 2015-05-11 (×8): qty 1

## 2015-05-11 MED ORDER — COLLAGENASE 250 UNIT/GM EX OINT
TOPICAL_OINTMENT | Freq: Every day | CUTANEOUS | Status: DC
  Administered 2015-05-11 – 2015-05-18 (×8): via TOPICAL
  Filled 2015-05-11 (×3): qty 30

## 2015-05-11 MED ORDER — HEPARIN SODIUM (PORCINE) 5000 UNIT/ML IJ SOLN
5000.0000 [IU] | Freq: Three times a day (TID) | INTRAMUSCULAR | Status: DC
  Administered 2015-05-11 – 2015-05-18 (×22): 5000 [IU] via SUBCUTANEOUS
  Filled 2015-05-11 (×22): qty 1

## 2015-05-11 MED ORDER — BOOST / RESOURCE BREEZE PO LIQD
1.0000 | Freq: Two times a day (BID) | ORAL | Status: DC
  Administered 2015-05-11 – 2015-05-18 (×12): 1 via ORAL

## 2015-05-11 MED ORDER — FUROSEMIDE 10 MG/ML IJ SOLN
40.0000 mg | Freq: Every day | INTRAMUSCULAR | Status: DC
Start: 2015-05-11 — End: 2015-05-12
  Administered 2015-05-11 – 2015-05-12 (×2): 40 mg via INTRAVENOUS
  Filled 2015-05-11 (×2): qty 4

## 2015-05-11 NOTE — Progress Notes (Signed)
On call paged about patient getting CPAP tonight waiting on order

## 2015-05-11 NOTE — Progress Notes (Signed)
Subjective:  Denies any chest pain or shortness of breath.  Complains of constipation.  States right leg pain has improved  Objective:  Vital Signs in the last 24 hours: Temp:  [97.9 F (36.6 C)-100.5 F (38.1 C)] 97.9 F (36.6 C) (10/31 0540) Pulse Rate:  [68-95] 95 (10/31 0540) Resp:  [18-19] 18 (10/31 0540) BP: (133-174)/(68-81) 133/68 mmHg (10/31 0540) SpO2:  [90 %-98 %] 98 % (10/31 0540) Weight:  [137.2 kg (302 lb 7.5 oz)] 137.2 kg (302 lb 7.5 oz) (10/31 0540)  Intake/Output from previous day: 10/30 0701 - 10/31 0700 In: 520 [P.O.:520] Out: 2000 [Urine:2000] Intake/Output from this shift:    Physical Exam: Neck: no adenopathy, no carotid bruit, no JVD and supple, symmetrical, trachea midline Lungs: decreased breath sounds at bases Heart: irregularly irregular rhythm, S1, S2 normal and soft systolic murmur noted Abdomen: soft, non-tender; bowel sounds normal; no masses,  no organomegaly Extremities: no clubbing, cyanosis, large area of ecchymosis with multiple ruptured blisters and 3+ edema noted Resolving hematoma Lab Results:  Recent Labs  05/09/15 0610  WBC 11.7*  HGB 12.8*  PLT 221    Recent Labs  05/09/15 0610 05/10/15 0612  NA 136 134*  K 3.8 3.7  CL 102 101  CO2 28 25  GLUCOSE 157* 167*  BUN 21* 22*  CREATININE 1.56* 1.45*   No results for input(s): TROPONINI in the last 72 hours.  Invalid input(s): CK, MB Hepatic Function Panel No results for input(s): PROT, ALBUMIN, AST, ALT, ALKPHOS, BILITOT, BILIDIR, IBILI in the last 72 hours. No results for input(s): CHOL in the last 72 hours. No results for input(s): PROTIME in the last 72 hours.  Imaging: Imaging results have been reviewed and No results found.  Cardiac Studies:  Assessment/Plan:  Cellulitis right leg associated with multiple ruptured blisters and small open wound Resolving hematoma status post fall Coronary artery disease history of PCI to LAD in remote past Hypertension Chronic  atrial fibrillation with moderate ventricular response Diabetes mellitus Chronic kidney disease Anemia of chronic disease Hyperlipidemia Morbid obesity Obstructive sleep apnea Degenerative joint disease Constipation. Plan Restart Lasix as per orders. Rx for constipation. Wound care.  Reconsult. Start heparin for DVT prophylaxis. OT, PT reconsult  LOS: 1 day     Charolette Forward 05/11/2015, 10:02 AM

## 2015-05-11 NOTE — Consult Note (Signed)
WOC wound consult note Reason for Consult: re-consulted for right knee wounds.  Two areas have progressed to eschar which is not uncommon with blood filled blisters  Several areas the bullas have ruptured and drained. Partial thickness skin loss.  RLE is intact with some partial thickness skin skin loss.  LLE has one small area that was present on admission as well that is unchanged.  Wound type: hematoma with blood filled bullas, some have ruptured Measurement: see previous measurements Wound bed: two new areas of eschar but not fluctuant  Drainage (amount, consistency, odor) serosanguinous  Periwound: hematoma unchanged  Dressing procedure/placement/frequency: Add enzymatic debridement ointment to the areas that have eschar. Continue xeroform for moisture and antibacterial effects.  Change daily.  [place], [place]

## 2015-05-11 NOTE — Progress Notes (Signed)
Gave patient soaps sud enema.  Patient tolerated half of the enema.  No stool present at this time

## 2015-05-11 NOTE — Progress Notes (Signed)
Initial Nutrition Assessment  DOCUMENTATION CODES:   Morbid obesity  INTERVENTION:   -Boost Breeze po BID, each supplement provides 250 kcal and 9 grams of protein -MVI daily -Recommend bowel regimen for pt (no BM since 05/07/15)  NUTRITION DIAGNOSIS:   Increased nutrient needs related to wound healing as evidenced by estimated needs.  GOAL:   Patient will meet greater than or equal to 90% of their needs  MONITOR:   PO intake, Supplement acceptance, Labs, Weight trends, Skin, I & O's  REASON FOR ASSESSMENT:   Consult Poor PO  ASSESSMENT:   Reginald [name] is a 79 y.o. male with Past medical history of atrial fibrillation on chronic anticoagulation, obstructive sleep apnea on C Pap, essential hypertension, chronic kidney disease, type 2 diabetes mellitus, chronic venous stasis.  Pt admitted with cellulitis.  Spoke with RN, who reports poor PO intake over the past few days. Expressed concern over poor PO intake due to wound healing.   Hx obtained by pt at bedside. He reports that he appetite has been poor, due to constipation. He reveals he has not had a BM in over 5 days (consistent with doc flowsheets, which reveals last BM on 05/07/15). Noted RN administered soaps suds enema this morning.   Pt denies any weight loss. Reports UBW around 303#. Nutrition-Focused physical exam completed. Findings are no fat depletion, no muscle depletion, and severe edema. Pt denies any decline in mobility or balance- reports his fall was accidental.  Discussed importance of good nutritional intake to promote healing. Discussed importance of consuming protein rich foods. Pt amenable to supplements- would like Boost Breeze as he tried it earlier. Plan discussed with RN.  Reviewed COWRN note from 05/11/15; two areas of rt knee wounds have progressed to eschar- enzymatic debridement will be added.   Labs reviewed: CBGS: 153-280. No recent Hgb A1c available.  Diet Order:  Diet Heart Room  service appropriate?: Yes; Fluid consistency:: Thin  Skin:  Wound (see comment) (eschar rt knee wound, bilateral LLE cellulitis)  Last BM:  05/07/15  Height:   Ht Readings from Last 1 Encounters:  05/08/15 5\' 10"  (1.778 m)    Weight:   Wt Readings from Last 1 Encounters:  05/11/15 302 lb 7.5 oz (137.2 kg)    Ideal Body Weight:  75.5 kg  BMI:  Body mass index is 43.4 kg/(m^2).  Estimated Nutritional Needs:   Kcal:  2200-2400  Protein:  120-135 grams  Fluid:  2.2-2.4 L  EDUCATION NEEDS:   Education needs addressed  Abygale Karpf A. Jimmye Norman, RD, LDN, CDE Pager: [phone] After hours Pager: [phone]

## 2015-05-12 LAB — BASIC METABOLIC PANEL
ANION GAP: 14 (ref 5–15)
BUN: 52 mg/dL — AB (ref 6–20)
CO2: 20 mmol/L — AB (ref 22–32)
Calcium: 8.7 mg/dL — ABNORMAL LOW (ref 8.9–10.3)
Chloride: 101 mmol/L (ref 101–111)
Creatinine, Ser: 2.58 mg/dL — ABNORMAL HIGH (ref 0.61–1.24)
GFR calc Af Amer: 26 mL/min — ABNORMAL LOW (ref >60)
GFR calc non Af Amer: 22 mL/min — ABNORMAL LOW (ref >60)
GLUCOSE: 191 mg/dL — AB (ref 65–99)
POTASSIUM: 3.8 mmol/L (ref 3.5–5.1)
Sodium: 135 mmol/L (ref 135–145)

## 2015-05-12 LAB — CBC
HEMATOCRIT: 39.3 % (ref 39.0–52.0)
HEMOGLOBIN: 12.6 g/dL — AB (ref 13.0–17.0)
MCH: 28.1 pg (ref 26.0–34.0)
MCHC: 32.1 g/dL (ref 30.0–36.0)
MCV: 87.5 fL (ref 78.0–100.0)
Platelets: 291 10*3/uL (ref 150–400)
RBC: 4.49 MIL/uL (ref 4.22–5.81)
RDW: 15.3 % (ref 11.5–15.5)
WBC: 19.1 10*3/uL — ABNORMAL HIGH (ref 4.0–10.5)

## 2015-05-12 LAB — CULTURE, BLOOD (ROUTINE X 2): Culture: NO GROWTH

## 2015-05-12 LAB — GLUCOSE, CAPILLARY
GLUCOSE-CAPILLARY: 186 mg/dL — AB (ref 65–99)
Glucose-Capillary: 275 mg/dL — ABNORMAL HIGH (ref 65–99)
Glucose-Capillary: 261 mg/dL — ABNORMAL HIGH (ref 65–99)
Glucose-Capillary: 188 mg/dL — ABNORMAL HIGH (ref 65–99)

## 2015-05-12 MED ORDER — GLIMEPIRIDE 1 MG PO TABS
1.0000 mg | ORAL_TABLET | Freq: Every day | ORAL | Status: DC
  Administered 2015-05-13 – 2015-05-14 (×2): 1 mg via ORAL
  Filled 2015-05-12 (×3): qty 1

## 2015-05-12 MED ORDER — FLEET ENEMA 7-19 GM/118ML RE ENEM
1.0000 | ENEMA | Freq: Once | RECTAL | Status: AC
  Administered 2015-05-12: 1 via RECTAL
  Filled 2015-05-12: qty 1

## 2015-05-12 MED ORDER — SENNOSIDES-DOCUSATE SODIUM 8.6-50 MG PO TABS
2.0000 | ORAL_TABLET | Freq: Every evening | ORAL | Status: DC | PRN
  Administered 2015-05-13: 2 via ORAL
  Filled 2015-05-12: qty 2

## 2015-05-12 MED ORDER — SODIUM CHLORIDE 0.9 % IV SOLN
INTRAVENOUS | Status: DC
  Administered 2015-05-12 – 2015-05-13 (×2): via INTRAVENOUS

## 2015-05-12 NOTE — Progress Notes (Signed)
Inpatient Diabetes Program Recommendations  AACE/ADA: New Consensus Statement on Inpatient Glycemic Control (2015)  Target Ranges:  Prepandial:   less than 140 mg/dL      Peak postprandial:   less than 180 mg/dL (1-2 hours)      Critically ill patients:  140 - 180 mg/dL  Results for CRISTOFER, RATHGEBER (MRN [id number]) as of 05/12/2015 11:25  Ref. Range 05/11/2015 08:00 05/11/2015 12:03 05/11/2015 16:59 05/11/2015 22:33 05/12/2015 07:52  Glucose-Capillary Latest Ref Range: 65-99 mg/dL 153 (H) 280 (H) 234 (H) 205 (H) 186 (H)   Review of Glycemic Control  Diabetes history: DM2 Outpatient Diabetes medications: Onglyza 5 mg daily Current orders for Inpatient glycemic control: Novlog 0-15 units TID with meals, Novolog 0-5 units HS  Inpatient Diabetes Program Recommendations: Insulin - Meal Coverage: Post prandial glucose is consistently elevated. While inpatient, please consider ordering Novolog 4 units TID with meals for meal coverage (in addition to Novolog correction scale).  Thanks, Barnie Alderman, RN, MSN, CDE Diabetes Coordinator Inpatient Diabetes Program [phone] (Team Pager from [place] to [place]) [phone] ([place] office) [phone] [facility] office) [phone] [facility] office)

## 2015-05-12 NOTE — NC FL2 (Signed)
[place] LEVEL OF CARE SCREENING TOOL     IDENTIFICATION  Patient Name: Reginald [name] Birthdate: [birth year] Sex: male Admission Date (Current Location): 05/07/2015  [place] [facility] and [place] Number: [employer] and Address:  [facility] [place], [place] [address] [place], [place] [address]      Provider Number: [id number]  Attending Physician Name and Address:  Dixie Dials, MD  Relative Name and Phone Number:  [name] [phone]    Current Level of Care: Hospital Recommended Level of Care: Nursing Facility Prior Approval Number:    Date Approved/Denied:   PASRR Number: [id number]  Discharge Plan: SNF    Current Diagnoses: Patient Active Problem List   Diagnosis Date Noted  . Diastolic dysfunction with chronic heart failure ([place]) 05/08/2015  . Blisters of multiple sites 05/08/2015  . Type 2 diabetes mellitus ([place])   . Obesity, unspecified   . OSA (obstructive sleep apnea)   . Atrial fibrillation ([place])   . Chronic kidney disease   . Glaucoma   . Cellulitis 05/07/2015    Orientation ACTIVITIES/SOCIAL BLADDER RESPIRATION    Self, Time, Situation, Place  Family supportive Continent (Patient has a condom catherter ) Normal  BEHAVIORAL SYMPTOMS/MOOD NEUROLOGICAL BOWEL NUTRITION STATUS      Continent Diet (Heart healthy; carb modified)  PHYSICIAN VISITS COMMUNICATION OF NEEDS Height & Weight Skin    Verbally   314 lbs. Other (Comment) (Cellulitis of the left knee)          AMBULATORY STATUS RESPIRATION    Assist extensive (PT did not evaluate patient on ambulation) Normal      Personal Care Assistance Level of Assistance  Dressing, Bathing, Feeding Bathing Assistance: Maximum assistance Feeding assistance: Maximum assistance Dressing Assistance: Maximum assistance      Functional Limitations Info  Speech, Hearing, Sight Sight Info: Adequate Hearing Info: Adequate Speech Info: Adequate       SPECIAL CARE  FACTORS FREQUENCY  PT (By licensed PT), OT (By licensed OT)     PT Frequency: 3 times per week - Evaluation date: 05/11/15 OT Frequency: 2 times per week - Evaluation date: 05/10/15           Additional Factors Info  Code Status, Allergies Code Status Info: FULL CODE (Updated on 05/08/15),  Allergies Info: No known allergies    Insulin Sliding Scale Info: 0-15 units, 3 times a day with meals        Current Medications (05/12/2015): Current Facility-Administered Medications  Medication Dose Route Frequency Provider Last Rate Last Dose  . 0.9 %  sodium chloride infusion   Intravenous Continuous Charolette Forward, MD 50 mL/hr at 05/12/15 1447    . acetaminophen (TYLENOL) tablet 650 mg  650 mg Oral Q6H PRN Lavina Hamman, MD       Or  . acetaminophen (TYLENOL) suppository 650 mg  650 mg Rectal Q6H PRN Lavina Hamman, MD      . acidophilus (RISAQUAD) capsule 1 capsule  1 capsule Oral QHS Lavina Hamman, MD   1 capsule at 05/11/15 2259  . amiodarone (PACERONE) tablet 200 mg  200 mg Oral QHS Lavina Hamman, MD   200 mg at 05/11/15 2259  . atorvastatin (LIPITOR) tablet 20 mg  20 mg Oral q1800 Lavina Hamman, MD   20 mg at 05/11/15 1714  . collagenase (SANTYL) ointment   Topical Daily Dixie Dials, MD      . feeding supplement (BOOST / RESOURCE BREEZE) liquid  1 Container  1 Container Oral BID BM Domenick Bookbinder, RD   1 Container at 05/12/15 1335  . [START ON 05/13/2015] glimepiride (AMARYL) tablet 1 mg  1 mg Oral Q breakfast Charolette Forward, MD      . heparin injection 5,000 Units  5,000 Units Subcutaneous 3 times per day Charolette Forward, MD   5,000 Units at 05/12/15 1334  . HYDROcodone-acetaminophen (NORCO/VICODIN) 5-325 MG per tablet 1-2 tablet  1-2 tablet Oral Q6H PRN Lavina Hamman, MD   2 tablet at 05/09/15 0743  . insulin aspart (novoLOG) injection 0-15 Units  0-15 Units Subcutaneous TID WC Lavina Hamman, MD   8 Units at 05/12/15 1238  . insulin aspart (novoLOG) injection 0-5 Units  0-5  Units Subcutaneous QHS Lavina Hamman, MD   2 Units at 05/11/15 2300  . latanoprost (XALATAN) 0.005 % ophthalmic solution 1 drop  1 drop Both Eyes QHS Lavina Hamman, MD   1 drop at 05/11/15 2300  . metoprolol (LOPRESSOR) tablet 50 mg  50 mg Oral BID Dixie Dials, MD   50 mg at 05/12/15 0852  . multivitamin with minerals tablet 1 tablet  1 tablet Oral Daily Domenick Bookbinder, RD   1 tablet at 05/12/15 0853  . ondansetron (ZOFRAN) tablet 4 mg  4 mg Oral Q6H PRN Lavina Hamman, MD       Or  . ondansetron [facility]) injection 4 mg  4 mg Intravenous Q6H PRN Lavina Hamman, MD      . senna-docusate (Senokot-S) tablet 2 tablet  2 tablet Oral QHS PRN Charolette Forward, MD      . sodium chloride 0.9 % injection 3 mL  3 mL Intravenous Q12H Lavina Hamman, MD   3 mL at 05/12/15 0854   Do not use this list as official medication orders. Please verify with discharge summary.  Discharge Medications:   Medication List    ASK your doctor about these medications        ACIDOPHILUS PROBIOTIC 10 MG Tabs  Take 10 mg by mouth 3 (three) times daily.     amiodarone 200 MG tablet  Commonly known as:  PACERONE  Take 200 mg by mouth at bedtime.     atorvastatin 20 MG tablet  Commonly known as:  LIPITOR  Take 20 mg by mouth daily at 6 PM.     cholecalciferol 1000 UNITS tablet  Commonly known as:  VITAMIN D  Take 1,000 Units by mouth daily.     clindamycin 150 MG capsule  Commonly known as:  CLEOCIN  Take 3 capsules (450 mg total) by mouth 3 (three) times daily. May dispense as 150mg  capsules     furosemide 40 MG tablet  Commonly known as:  LASIX  Take 40 mg by mouth at bedtime.     HYDROcodone-acetaminophen 5-325 MG tablet  Commonly known as:  NORCO  Take 1-2 tablets by mouth every 6 (six) hours as needed for moderate pain or severe pain.     latanoprost 0.005 % ophthalmic solution  Commonly known as:  XALATAN  Place 1 drop into both eyes at bedtime.     lisinopril 40 MG tablet  Commonly known as:   PRINIVIL,ZESTRIL  Take 40 mg by mouth 2 (two) times daily.     nitroGLYCERIN 0.4 MG SL tablet  Commonly known as:  NITROSTAT  Place 0.4 mg under the tongue every 5 (five) minutes as needed for chest pain.     potassium chloride  SA 20 MEQ tablet  Commonly known as:  K-DUR,KLOR-CON  Take 20 mEq by mouth 3 (three) times daily.     saxagliptin HCl 5 MG Tabs tablet  Commonly known as:  ONGLYZA  Take 5 mg by mouth daily.     XARELTO 15 MG Tabs tablet  Generic drug:  Rivaroxaban  Take 15 mg by mouth daily with supper.        Relevant Imaging Results:  Relevant Lab Results:  Recent Labs    Additional Information    Radonna Ricker, Student-SW [phone]

## 2015-05-12 NOTE — Progress Notes (Signed)
Subjective:  Denies chest pain or shortness of breath, leg swelling, slightly improved.  Complains of constipation.  Renal function progressively getting worst, secondary to diuretics, ace inhibitors have been DC'd and vancomycin.  Objective:  Vital Signs in the last 24 hours: Temp:  [97.5 F (36.4 C)-99.8 F (37.7 C)] 99.3 F (37.4 C) (11/01 1349) Pulse Rate:  [71-103] 71 (11/01 1349) Resp:  [18-26] 22 (11/01 1349) BP: (121-156)/(63-86) 121/63 mmHg (11/01 1349) SpO2:  [91 %-96 %] 96 % (11/01 1349) Weight:  [142.475 kg (314 lb 1.6 oz)] 142.475 kg (314 lb 1.6 oz) (11/01 0520)  Intake/Output from previous day: 10/31 0701 - 11/01 0700 In: 883 [P.O.:480; I.V.:3; IV Piggyback:400] Out: 1450 [Urine:1450] Intake/Output from this shift: Total I/O In: 444 [P.O.:444] Out: -   Physical Exam: Neck: no adenopathy, no carotid bruit, no JVD and supple, symmetrical, trachea midline Lungs: clear to auscultation bilaterally Heart: irregularly irregular rhythm, S1, S2 normal and soft systolic murmur noted Abdomen: soft, non-tender; bowel sounds normal; no masses,  no organomegaly Extremities: right leg.  Surgical dressing  Lab Results:  Recent Labs  05/12/15 0535  WBC 19.1*  HGB 12.6*  PLT 291    Recent Labs  05/10/15 0612 05/12/15 0535  NA 134* 135  K 3.7 3.8  CL 101 101  CO2 25 20*  GLUCOSE 167* 191*  BUN 22* 52*  CREATININE 1.45* 2.58*   No results for input(s): TROPONINI in the last 72 hours.  Invalid input(s): CK, MB Hepatic Function Panel No results for input(s): PROT, ALBUMIN, AST, ALT, ALKPHOS, BILITOT, BILIDIR, IBILI in the last 72 hours. No results for input(s): CHOL in the last 72 hours. No results for input(s): PROTIME in the last 72 hours.  Imaging: Imaging results have been reviewed and No results found.  Cardiac Studies:  Assessment/Plan:  Cellulitis right leg associated with multiple ruptured blisters and small open wound Resolving hematoma status post  fall Coronary artery disease history of PCI to LAD in remote past Hypertension Chronic atrial fibrillation with moderate ventricular response Diabetes mellitus Acute onChronic kidney disease stage III multifactorial as above Anemia of chronic disease Hyperlipidemia Morbid obesity Obstructive sleep apnea Degenerative joint disease Constipation. Plan DC IV Lasix.slow hydration Hold Ace inhibitors for now. Head just vancomycin dose as per pharmacy. Check labs in a.m. Rx for constipation. Start Amaryl 1 mg daily  LOS: 2 days    Charolette Forward 05/12/2015, 2:02 PM

## 2015-05-12 NOTE — Progress Notes (Signed)
ANTIBIOTIC CONSULT NOTE - FOLLOW UP  Pharmacy Consult for vancomycin  Indication: cellulitis of R leg  No Known Allergies  Patient Measurements: Height: 5\' 10"  (177.8 cm) Weight: (!) 314 lb 1.6 oz (142.475 kg) IBW/kg (Calculated) : 73  Vital Signs: Temp: 99.3 F (37.4 C) (11/01 1349) Temp Source: Oral (11/01 1349) BP: 121/63 mmHg (11/01 1349) Pulse Rate: 71 (11/01 1349) Intake/Output from previous day: 10/31 0701 - 11/01 0700 In: 883 [P.O.:480; I.V.:3; IV Piggyback:400] Out: 1450 [Urine:1450]     Labs:  Recent Labs  05/10/15 0612 05/12/15 0535  WBC  --  19.1*  HGB  --  12.6*  PLT  --  291  CREATININE 1.45* 2.58*      Microbiology: Abx Vancomycin 10/27>>  10/30 2209: VT 12 mcg/ml on Vanc 1gm q12h  11/01 vancomycin dc/d due to AKI  *VT 12 on 10/30 on 1 gm q12H (SCr 1.45)  ID:  10/27 Blood x 2 - ngtd  Assessment: 79yoM with cellulitis of R leg with ruptured blisters. SCr 2.58 < -- 1.45 over last 2 days and UOP has diminished. With inc in SCr/BUN and negative fluid balance may be a little dry.  Goal of Therapy:  Vancomycin trough level 10-15 mcg/ml  Plan:  1. Stop vancomycin 2. VR ordered for 11/02 to assess clearance of drug and help guide further dosing needs in setting of AKI 3. BMP ordered for 11/2 4. Further recs on 11/2  Vincenza Hews, PharmD, BCPS 05/12/2015, 2:01 PM Pager: [phone]

## 2015-05-12 NOTE — Procedures (Signed)
Pt placed on hospital CPAP with home setting. Pt is comfortably and resting nicely.

## 2015-05-12 NOTE — Progress Notes (Signed)
Called MD office to discuss patients rising creatinine levels and iv lasix.  Also patient is concerned about not having a BM since 05/02/15.  Left message with office.  Will call again if no response  soon

## 2015-05-12 NOTE — Progress Notes (Signed)
Physical Therapy Treatment Patient Details Name: Reginald [name] MRN: [id number] DOB: 1936-05-06 Today's Date: 05/12/2015    History of Present Illness Pt admitted s/p fall with progressive swelling and blistering lower legs.  He is a 79 y.o. male with Past medical history of atrial fibrillation on chronic anticoagulation, obstructive sleep apnea on C Pap, essential hypertension, chronic kidney disease, type 2 diabetes mellitus, chronic venous stasis.    PT Comments    Patient progressing slowly towards PT goals. Continues to require assist of 2 to get to EOB. Tolerated there ex and ADL tasks sitting EOB with support as needed. Pt fatigues easily. Dypsnea present bur Sp02 stable on 2L/min 02. Better able to mobilize BLEs today from prior session. Continues to be appropriate for ST SNF. Will follow acutely per current POC.   Follow Up Recommendations  SNF;Supervision/Assistance - 24 hour     Equipment Recommendations  Other (comment) (defer to next venue)    Recommendations for Other Services       Precautions / Restrictions Precautions Precautions: Fall Precaution Comments: prior fall at home while reaching for item off lower surface Restrictions Weight Bearing Restrictions: No Other Position/Activity Restrictions: weeping RLE    Mobility  Bed Mobility Overal bed mobility: Needs Assistance Bed Mobility: Supine to Sit;Sit to Supine     Supine to sit: +2 for physical assistance;Max assist Sit to supine: Max assist;+2 for physical assistance   General bed mobility comments: Able to initiate bringing BLEs to EOB - more assist needed with LLE. CUes for technique. Pt reaching for therapist's hand to help elevate trunk. Assist to scoot bottom to EOB and elevate trunk.   Transfers                 General transfer comment: Not performed today.   Ambulation/Gait                 Stairs            Wheelchair Mobility    Modified Rankin (Stroke Patients  Only)       Balance Overall balance assessment: Needs assistance Sitting-balance support: Feet supported;Bilateral upper extremity supported Sitting balance-Leahy Scale: Poor Sitting balance - Comments: Tolerated sitting EOB x10 minutes performing there ex, washing face and working on upright posture. Fatigues easily with posterior lean and LOB. Min guard assist with moments of Mod A to prevent LOB. Postural control: Posterior lean                          Cognition Arousal/Alertness: Awake/alert Behavior During Therapy: WFL for tasks assessed/performed Overall Cognitive Status: No family/caregiver present to determine baseline cognitive functioning ("Why does the clock say 9, it is light out?" thinking it was night time after eating breakfast. Oriented to place, month.)                      Exercises General Exercises - Lower Extremity Ankle Circles/Pumps: Both;15 reps;Seated Short Arc Quad: Both;10 reps;Supine Long Arc Quad: Both;5 reps;Seated Hip ABduction/ADduction: Both;5 reps;Seated    General Comments General comments (skin integrity, edema, etc.): drainage from RLE. Redness, erythema and swelling noted in right foot.      Pertinent Vitals/Pain Pain Assessment: No/denies pain    Home Living                      Prior Function            PT Goals (  current goals can now be found in the care plan section) Progress towards PT goals: Progressing toward goals (slowly)    Frequency  Min 2X/week    PT Plan Current plan remains appropriate;Frequency needs to be updated    Co-evaluation             End of Session Equipment Utilized During Treatment: Oxygen Activity Tolerance: Patient limited by fatigue;Patient tolerated treatment well Patient left: in bed;with call bell/phone within reach;with bed alarm set     Time: [id number] PT Time Calculation (min) (ACUTE ONLY): 23 min  Charges:  $Therapeutic Exercise: 8-22 mins $Therapeutic  Activity: 8-22 mins                    G Codes:      Mariyam Remington A Mashell Sieben 05/12/2015, 10:12 AM Wray Kearns, PT, DPT [phone]

## 2015-05-12 NOTE — Progress Notes (Signed)
Gave the enema.  Fluid came back out in a brown tinged color.  Pateint placed on bedpan and instructed to try to have a bowl movement.  Will continue to monitor

## 2015-05-12 NOTE — Clinical Social Work Note (Signed)
CSW Intern contacted [name], the admissions Mudlogger at [facility] regarding Mr. Degrande returning to the facility after he is ready for discharge. Initially, [facility] declined patient , but after speaking with Ms. Nikki via telephone she asked for the patient's information to be resent. She stated that she didn't remember why the patient was declined and wanted to further review the clinicals.

## 2015-05-12 NOTE — Clinical Documentation Improvement (Signed)
Cardiology  Can the diagnosis of CKD be further specified in progress notes and discharge summary?   CKD Stage I - GFR greater than or equal to 90  CKD Stage II - GFR 60-89  CKD Stage III - GFR 30-59  CKD Stage IV - GFR 15-29  CKD Stage V - GFR < 15  ESRD (End Stage Renal Disease)  Other condition  Unable to clinically determine   Supporting Information: History of CKD Per H&P:  7 Chronic kidney disease Renal function stable will continue close monitoring.  Component     Latest Ref Rng 05/07/2015 05/08/2015 05/09/2015 05/10/2015             BUN     6 - 20 mg/dL '18 17 21 ' (H) 22 (H)  Creatinine     0.61 - 1.24 mg/dL 1.29 (H) 1.46 (H) 1.56 (H) 1.45 (H)        EGFR (Non-African Amer.)     >60 mL/min 52 (L) 45 (L) 41 (L) 45 (L)  EGFR (African American)     >60 mL/min >60 52 (L) 48 (L) 52 (L)   Component     Latest Ref Rng 05/12/2015          BUN     6 - 20 mg/dL 52 (H)  Creatinine     0.61 - 1.24 mg/dL 2.58 (H)     EGFR (Non-African Amer.)     >60 mL/min 22 (L)  EGFR (African American)     >60 mL/min 26 (L)   Treatments Monitoring I&O Monitoring BMP Daily weights Saline lock  Please exercise your independent, professional judgment when responding. A specific answer is not anticipated or expected.   Thank You, Dwight [phone]

## 2015-05-12 NOTE — Clinical Social Work Note (Signed)
Clinical Social Work Assessment  Patient Details  Name: Reginald [name] MRN: [id number] Date of Birth: Mar 29, 1936  Date of referral:  05/12/15               Reason for consult:  Facility Placement                Permission sought to share information with:  Facility Art therapist granted to share information::  Yes, Verbal Permission Granted  Name::     [name]   Agency::     Relationship::  Friend   Contact Information:  [phone]  Housing/Transportation Living arrangements for the past 2 months:  [place] (Mailing Address: [address] ,[place] [address], Residence Address: [place], [place] [address]) Source of Information:  Patient Patient Interpreter Needed:  None Criminal Activity/Legal Involvement Pertinent to Current Situation/Hospitalization:  No - Comment as needed Significant Relationships:  Friend, Adult Children (Margie Elder (friend), Zyrus Brockhoff (daughter-in-law)) Lives with:  Self Do you feel safe going back to the place where you live?  Yes Need for family participation in patient care:  Yes (Comment)  Care giving concerns:  No care giving concerns presented at the time.    Social Worker assessment / plan:  CSW Intern spoke with Mr. Bessett regarding PT and OT evaluations that recommends the patient to  discharge to a SNF for short-term rehab. Mr. Briese agreed to go to rehab and stated that he had been to [facility] before, and would like to return if there are beds available. CSW Intern provided patient with a list of facilities in surrounding areas, and asked the patient to think of other options if [facility] did not offer a bed. The patient had no further questions or concerns.   Employment status:  Retired Forensic scientist:  Medicare ([employer] planner Supplement ) PT Recommendations:  [place] / Referral to community resources:  [place]  Patient/Family's Response to care:  Not discussed   Patient/Family's Understanding of and Emotional Response to Diagnosis, Current Treatment, and Prognosis:  Not discussed.   Emotional Assessment Appearance:  Appears stated age Attitude/Demeanor/Rapport:    Affect (typically observed):  Calm, Appropriate, Accepting Orientation:  Oriented to Self, Oriented to Place, Oriented to  Time, Oriented to Situation Alcohol / Substance use:  Never Used Psych involvement (Current and /or in the community):  No (Comment)  Discharge Needs  Concerns to be addressed:  Discharge Planning Concerns Readmission within the last 30 days:  Yes Current discharge risk:   (Deconditioned) Barriers to Discharge:  No Barriers Identified   Radonna Ricker, Student-SW 05/12/2015, 12:58 PM

## 2015-05-12 NOTE — Clinical Social Work Placement (Cosign Needed Addendum)
   CLINICAL SOCIAL WORK PLACEMENT  NOTE  Date:  05/12/2015  Patient Details  Name: Reginald [name] MRN: [id number] Date of Birth: 11/25/1935  Clinical Social Work is seeking post-discharge placement for this patient at the [place] level of care (*CSW will initial, date and re-position this form in  chart as items are completed):  Yes   Patient/family provided with [place] Work Department's list of facilities offering this level of care within the geographic area requested by the patient (or if unable, by the patient's family).  Yes   Patient/family informed of their freedom to choose among providers that offer the needed level of care, that participate in Medicare, Medicaid or managed care program needed by the patient, have an available bed and are willing to accept the patient.  Yes   Patient/family informed of [facility]'s ownership interest in [facility] and [facility], as well as of the fact that they are under no obligation to receive care at these facilities.  PASRR submitted to EDS on       PASRR number received on 05/12/15     Existing PASRR number confirmed on 05/12/15     FL2 transmitted to all facilities in geographic area requested by pt/family on 05/12/15     FL2 transmitted to all facilities within larger geographic area on 05/12/15     Patient informed that his/her managed care company has contracts with or will negotiate with certain facilities, including the following:            Patient/family informed of bed offers received.  Patient chooses bed at       Physician recommends and patient chooses bed at      Patient to be transferred to   on  .  Patient to be transferred to facility by       Patient family notified on   of transfer.  Name of family member notified:        PHYSICIAN Please sign FL2, Please prepare priority discharge summary, including medications, Please prepare prescriptions      Additional Comment:    _______________________________________________ Radonna Ricker, Student-SW 05/12/2015, 3:36 PM

## 2015-05-13 ENCOUNTER — Inpatient Hospital Stay (HOSPITAL_COMMUNITY): Payer: Medicare Other

## 2015-05-13 LAB — URINALYSIS W MICROSCOPIC (NOT AT ARMC)
Bilirubin Urine: NEGATIVE
Glucose, UA: NEGATIVE mg/dL
Ketones, ur: NEGATIVE mg/dL
Nitrite: NEGATIVE
Protein, ur: 30 mg/dL — AB
Specific Gravity, Urine: 1.01 (ref 1.005–1.030)
Urobilinogen, UA: 1 mg/dL (ref 0.0–1.0)
pH: 5.5 (ref 5.0–8.0)

## 2015-05-13 LAB — PHOSPHORUS: PHOSPHORUS: 6 mg/dL — AB (ref 2.5–4.6)

## 2015-05-13 LAB — MAGNESIUM: Magnesium: 2.4 mg/dL (ref 1.7–2.4)

## 2015-05-13 LAB — CULTURE, BLOOD (ROUTINE X 2): Culture: NO GROWTH

## 2015-05-13 LAB — BASIC METABOLIC PANEL
Anion gap: 17 — ABNORMAL HIGH (ref 5–15)
BUN: 86 mg/dL — AB (ref 6–20)
CO2: 19 mmol/L — ABNORMAL LOW (ref 22–32)
Calcium: 8.4 mg/dL — ABNORMAL LOW (ref 8.9–10.3)
Chloride: 97 mmol/L — ABNORMAL LOW (ref 101–111)
Creatinine, Ser: 4.3 mg/dL — ABNORMAL HIGH (ref 0.61–1.24)
GFR calc Af Amer: 14 mL/min — ABNORMAL LOW (ref >60)
GFR, EST NON AFRICAN AMERICAN: 12 mL/min — AB (ref >60)
Glucose, Bld: 184 mg/dL — ABNORMAL HIGH (ref 65–99)
POTASSIUM: 4.1 mmol/L (ref 3.5–5.1)
SODIUM: 133 mmol/L — AB (ref 135–145)

## 2015-05-13 LAB — GLUCOSE, CAPILLARY
GLUCOSE-CAPILLARY: 174 mg/dL — AB (ref 65–99)
GLUCOSE-CAPILLARY: 209 mg/dL — AB (ref 65–99)
Glucose-Capillary: 120 mg/dL — ABNORMAL HIGH (ref 65–99)
Glucose-Capillary: 133 mg/dL — ABNORMAL HIGH (ref 65–99)

## 2015-05-13 LAB — CBC
HEMATOCRIT: 38.6 % — AB (ref 39.0–52.0)
Hemoglobin: 12.9 g/dL — ABNORMAL LOW (ref 13.0–17.0)
MCH: 28.9 pg (ref 26.0–34.0)
MCHC: 33.4 g/dL (ref 30.0–36.0)
MCV: 86.4 fL (ref 78.0–100.0)
PLATELETS: 319 10*3/uL (ref 150–400)
RBC: 4.47 MIL/uL (ref 4.22–5.81)
RDW: 15.4 % (ref 11.5–15.5)
WBC: 20.7 10*3/uL — AB (ref 4.0–10.5)

## 2015-05-13 LAB — CK: Total CK: 549 U/L — ABNORMAL HIGH (ref 49–397)

## 2015-05-13 LAB — VANCOMYCIN, RANDOM: Vancomycin Rm: 25 ug/mL

## 2015-05-13 MED ORDER — POLYETHYLENE GLYCOL 3350 17 G PO PACK
17.0000 g | PACK | Freq: Two times a day (BID) | ORAL | Status: DC
  Administered 2015-05-13 – 2015-05-15 (×3): 17 g via ORAL
  Filled 2015-05-13 (×5): qty 1

## 2015-05-13 MED ORDER — SORBITOL 70 % SOLN
30.0000 mL | Status: DC | PRN
  Filled 2015-05-13: qty 30

## 2015-05-13 MED ORDER — LACTATED RINGERS IV SOLN
INTRAVENOUS | Status: DC
  Administered 2015-05-13 – 2015-05-15 (×4): via INTRAVENOUS

## 2015-05-13 MED ORDER — SODIUM CHLORIDE 0.9 % IV BOLUS (SEPSIS): 1000.0000 mL | Freq: Once | INTRAVENOUS | Status: DC

## 2015-05-13 NOTE — Consult Note (Signed)
Reginald Jerrett EdD 

## 2015-05-13 NOTE — Progress Notes (Signed)
Patient has refused CPAP for the night. Resting comfortably on a nasal cannula. RT will continue to monitor.

## 2015-05-13 NOTE — Progress Notes (Signed)
Subjective:  Denies any chest pain or shortness of breath. States had BM yesterday. Renal function progressively getting worst  Objective:  Vital Signs in the last 24 hours: Temp:  [97.5 F (36.4 C)-99.7 F (37.6 C)] 97.5 F (36.4 C) (11/02 0819) Pulse Rate:  [71-101] 89 (11/02 0819) Resp:  [20-22] 22 (11/02 0819) BP: (121-156)/(63-87) 147/82 mmHg (11/02 0819) SpO2:  [95 %-97 %] 95 % (11/02 0819) Weight:  [142.475 kg (314 lb 1.6 oz)] 142.475 kg (314 lb 1.6 oz) (11/02 0507)  Intake/Output from previous day: 11/01 0701 - 11/02 0700 In: 1284 [P.O.:684; I.V.:600] Out: 250 [Urine:250] Intake/Output from this shift: Total I/O In: 600 [P.O.:600] Out: 200 [Urine:200]  Physical Exam: Neck: no adenopathy, no carotid bruit, no JVD and supple, symmetrical, trachea midline Lungs: Clear anterolaterally Heart: irregularly irregular rhythm, S1, S2 normal and Soft systolic murmur noted Abdomen: soft, non-tender; bowel sounds normal; no masses,  no organomegaly Extremities: No clubbing cyanosis 2+ edema noted right leg surgical dressing  Lab Results:  Recent Labs  05/12/15 0535 05/13/15 0513  WBC 19.1* 20.7*  HGB 12.6* 12.9*  PLT 291 319    Recent Labs  05/12/15 0535 05/13/15 0513  NA 135 133*  K 3.8 4.1  CL 101 97*  CO2 20* 19*  GLUCOSE 191* 184*  BUN 52* 86*  CREATININE 2.58* 4.30*   No results for input(s): TROPONINI in the last 72 hours.  Invalid input(s): CK, MB Hepatic Function Panel No results for input(s): PROT, ALBUMIN, AST, ALT, ALKPHOS, BILITOT, BILIDIR, IBILI in the last 72 hours. No results for input(s): CHOL in the last 72 hours. No results for input(s): PROTIME in the last 72 hours.  Imaging: Imaging results have been reviewed and No results found.  Cardiac Studies:  Assessment/Plan:  Cellulitis right leg associated with multiple ruptured blisters and small open wound Resolving hematoma status post fall Coronary artery disease history of PCI to LAD  in  remote past Hypertension Chronic atrial fibrillation with moderate ventricular response Diabetes mellitus Acute onChronic kidney disease stage III multifactorial as above Anemia of chronic disease Hyperlipidemia Morbid obesity Obstructive sleep apnea Degenerative joint disease Constipation. Marked leukocytosis Plan Check renal ultrasound Foley catheter Bladder scan Renal service consult Check labs in a.m. Continue slow hydration Out of bed to chair   LOS: 3 days    Charolette Forward 05/13/2015, 11:37 AM

## 2015-05-13 NOTE — Progress Notes (Signed)
ANTIBIOTIC CONSULT NOTE - FOLLOW UP  Pharmacy Consult for vancomycin  Indication: cellulitis of R leg  No Known Allergies  Patient Measurements: Height: 5\' 10"  (177.8 cm) Weight: (!) 314 lb 1.6 oz (142.475 kg) IBW/kg (Calculated) : 73  Vital Signs: Temp: 97.5 F (36.4 C) (11/02 0819) Temp Source: Oral (11/02 0819) BP: 147/82 mmHg (11/02 0819) Pulse Rate: 89 (11/02 0819) Intake/Output from previous day: 11/01 0701 - 11/02 0700 In: 1284 [P.O.:684; I.V.:600] Out: 250 [Urine:250]     Labs:  Recent Labs  05/12/15 0535 05/13/15 0513  WBC 19.1* 20.7*  HGB 12.6* 12.9*  PLT 291 319  CREATININE 2.58* 4.30*      Microbiology: Abx Vancomycin 10/27>>  10/30 2209: VT 12 mcg/ml on Vanc 1gm q12h  11/01 vancomycin dc/d due to AKI  10/30 VT 12 mcg/mL on 1g q12h (sCr 1.45) 11/2 VR 25 mcg/mL after holding dose for 24h (sCr 4.3)   Assessment: 79yoM with cellulitis of R leg with ruptured blisters. VR is supratherapeutic at 25 after holding dose for 24h due to AKI. sCr is now up to 4.3 from 2.58. Pt remains afebrile and WBC trending up to 20.7.  Goal of Therapy:  Vancomycin trough level 10-15 mcg/ml  Plan:  Continue to hold vancomycin VR tomorrow at 1200 Monitor cultures, renal function and clinical course  Andrey Cota. Diona Foley, PharmD Clinical Pharmacist Pager [phone]  05/13/2015, 12:41 PM

## 2015-05-13 NOTE — Progress Notes (Signed)
Bladder scanned 209 ml. Paged results to Dr Terrence Dupont per request.

## 2015-05-13 NOTE — Clinical Social Work Note (Signed)
CSW talked with admissions director, [name] at [facility] regarding patient. She asked questions about his weight and expressed concern about him comfortably fitting into their beds. Admissions director will follow-up with CSW regarding accepting patient. Patient does have bed offers from 3 other facilities.  Reginald [name], MSW, LCSW Licensed Clinical Social Worker [place] [phone]

## 2015-05-13 NOTE — Progress Notes (Signed)
Inpatient Diabetes Program Recommendations  AACE/ADA: New Consensus Statement on Inpatient Glycemic Control (2015)  Target Ranges:  Prepandial:   less than 140 mg/dL      Peak postprandial:   less than 180 mg/dL (1-2 hours)      Critically ill patients:  140 - 180 mg/dL    Results for SIMBA, ARTINO (MRN [id number]) as of 05/13/2015 08:24  Ref. Range 05/12/2015 07:52 05/12/2015 11:58 05/12/2015 16:52 05/12/2015 21:57  Glucose-Capillary Latest Ref Range: 65-99 mg/dL 186 (H) 275 (H) 261 (H) 188 (H)    Home DM Meds: Onglyza 5 mg daily  Current Insulin Orders: Novolog Moderate SSI (0-15 units) TID AC + HS      Amaryl 1 mg daily     -Not sure why Amaryl 1 mg daily started today?  Patient does not take Amaryl at home per Home Med Rec.  MD- Please start Tradjenta 5 mg daily as a substitute for patient's home dose of Onglyza and discontinue current orders for Amaryl 1 mg daily      --Will follow patient during hospitalization--  Wyn Quaker RN, MSN, CDE Diabetes Coordinator Inpatient Glycemic Control Team Team Pager: [phone] (8a-5p)

## 2015-05-13 NOTE — Progress Notes (Signed)
Per Dr Terrence Dupont, hold off on inserting Foley catheter at this time. MD states patient is to be seen by nephrologist.

## 2015-05-13 NOTE — Consult Note (Signed)
Nephrology Consult  Patient name: Reginald [name] Medical record number: [id number] Date of birth: 10/08/1935 Age: 79 y.o. Gender: male  Primary Care Provider: Charolette Forward, MD  Pt Overview and Major Events to Date:   Pt. Is a 79 y/o M with hx of CKD III A, DMII (per pt. Last A1C 6.1 a few months ago.), Atrial Fibrillation, Chronic Anticoagulation, CHFpEF, pulmonary HTN, OSA.   Pt. Presented on 10/28 with worsening right sided leg pain, ecchymosis, and blisters with concern for cellulitis. He had developed ecchymosis / hematoma after a mechanical fall on 10/26 but was discharged home. His pain worsened and he developed small blisters as above. He did not have any fever or chills at the time. He was admitted and started on broad spectrum antibiotics for Cellulitis. Due to concern that this might be SJS his xarelto was discontinued. General Surgery was consulted and felt that there was nothing to do from their standpoint. He has had some improvement since admission, but did have Fever on 10/28 and 10/30 while on Vancomycin with uptrending WBC from 11.7 > 20. This with a new O2 requirement over the past two days. His creatinine baseline appears to be around 1.4 which began to spike on 11/1 to 2.58 with subsequent drop in UOP to around 0.5L in the past 24 hours. The patient has a condom catheter. He complains of some mild abdominal pain / bloating. Bladder scan with 200cc's this am. A renal ultrasound was ordered. He has not received contrast. He has received Lasix as recently as yesterday.   Assessment and Plan: 79 y/o M here with Cellulitis, and ATN acute on chronic kidney disease, Atrial Fibrillation.   ATN, Oliguria - acute on chronic kidney disease. Baseline Cr near 1.4. It is 4.3 today with significant drop in UOP. Condom cath in place. Recent Lasix. Had Lisinopril day 1-2 of hospitalization. Bladder scan without evidence of retention. Pt. Is net negative 3.6L. Getting 50cc/hr of NS. - Likely  prerenal - fluid challenge - Hold nephrotoxic meds lasix, lisinopril.  - Will see what the renal ultrasound shows.  - Check Mg, Phos -  Last U/A on 9/28. Repeat with microscopic.  - check CK  - rule out obstruction with foley catheter.  - No phos or magnesium based cathartics.   Cellulitis / Resolving Hematoma - Unable to examine due to dressing. Blisters noted near his ankle. On Vancomycin with WBC trending up.  - Mgmt per primary team.  - ? Broadening abx / search for additional source of infection. Will leave this up to the primary team.   SOB - pt. With [place] 2L in place. Says he doesn't overtly feel SOB. Obese with known OSA cpap dependent at night. Looks like he is working some, though RR is not tachypneic. No documented desaturations. He does have crackles in his lung bases L > R. Additionally with fever on Vanc as above. ? Partially treated pneumonia.   - Recommend CXR - Ongoing mgmt per primary team.    Afib - Dr. Terrence Dupont is managing. Rate controlled on amiodarone. No Xarelto at this time.   HTN - per primary team.   Constipation - per primary team. No BM since 10/21 per pt.  - prn stool softener. Will change to scheduled.   PPx: heparin sub q.   Disposition: pending improvement.   Subjective:  Pt. Says that his leg is still in pain today. He feels only a little better since admission. He says that he has not had  any further fevers. He says that he has some abdominal discomfort mostly in the form of bloating. He says he has not had a BM since 10/21. He denies suprapubic pain, dysuria, hematuria. He denies flank pain. No nausea. Says his diabetes and HTN are fairly well controlled at baseline with last A1C 6.1 a "couple of months ago". He follows with PCP at [facility].   Objective: Temp:  [97.5 F (36.4 C)-99.7 F (37.6 C)] 98.9 F (37.2 C) (11/02 1253) Pulse Rate:  [81-101] 84 (11/02 1253) Resp:  [20-22] 20 (11/02 1253) BP: (138-156)/(78-87) 138/78 mmHg  (11/02 1253) SpO2:  [95 %-97 %] 97 % (11/02 1253) Weight:  [314 lb 1.6 oz (142.475 kg)] 314 lb 1.6 oz (142.475 kg) (11/02 0507) Physical Exam: General: NAD, laying in bed, morbidly obese.  Cardiovascular: Irregularly Irregular. Respiratory: Crackles in the bases to the mid lung field Abdomen: Large, obese, soft, distended, mild discomfort with palpation.  Extremities: RLE with bandage in place, some blistering below his bandage on his ankle. LLE with bandage to shin ulcer. Some trace - 1+ edema of his BL LE.   Laboratory:  Recent Labs Lab 05/09/15 0610 05/12/15 0535 05/13/15 0513  WBC 11.7* 19.1* 20.7*  HGB 12.8* 12.6* 12.9*  HCT 39.7 39.3 38.6*  PLT 221 291 319    Recent Labs Lab 05/08/15 0435  05/10/15 0612 05/12/15 0535 05/13/15 0513  NA 141  < > 134* 135 133*  K 3.9  < > 3.7 3.8 4.1  CL 105  < > 101 101 97*  CO2 30  < > 25 20* 19*  BUN 17  < > 22* 52* 86*  CREATININE 1.46*  < > 1.45* 2.58* 4.30*  CALCIUM 8.7*  < > 8.7* 8.7* 8.4*  PROT 5.5*  --   --   --   --   BILITOT 1.0  --   --   --   --   ALKPHOS 58  --   --   --   --   ALT 21  --   --   --   --   AST 18  --   --   --   --   GLUCOSE 138*  < > 167* 191* 184*  < > = values in this interval not displayed.     [name], MD 05/13/2015, 2:13 PM PGY-2, [place]

## 2015-05-13 NOTE — Progress Notes (Signed)
Occupational Therapy Treatment Patient Details Name: Reginald [name] MRN: [id number] DOB: 1936-06-19 Today's Date: 05/13/2015    History of present illness Pt admitted s/p fall with progressive swelling and blistering lower legs.  He is a 79 y.o. male with Past medical history of atrial fibrillation on chronic anticoagulation, obstructive sleep apnea on C Pap, essential hypertension, chronic kidney disease, type 2 diabetes mellitus, chronic venous stasis.   OT comments  Pt able to sit EOB and perform ADLs, but was effortful. Continue to recommend SNF.  Follow Up Recommendations  SNF    Equipment Recommendations  None recommended by OT    Recommendations for Other Services      Precautions / Restrictions Precautions Precautions: Fall Precaution Comments: prior fall at home while reaching for item off lower surface Restrictions Weight Bearing Restrictions: No       Mobility Bed Mobility Overal bed mobility: Needs Assistance Bed Mobility: Supine to Sit;Sit to Supine     Supine to sit: +2 for physical assistance;Max assist Sit to supine: +2 for safety/equipment;Mod assist   General bed mobility comments: assist with legs and trunk to come to EOB. Trendelenburg position used to scoot HOB and pt able to pull self up with arms.  Transfers                 General transfer comment: not assessed.    Balance        Mod-Max assist for sitting balance EOB.                           ADL Overall ADL's : Needs assistance/impaired Eating/Feeding: Bed level;sitting (pt drank water sitting EOB with Mod assist for balance sitting EOB, but able to drink water independently at bed level). Eating/Feeding Details (indicate cue type and reason): drank water Grooming: Wash/dry face;Oral care;Applying deodorant;Moderate assistance;Sitting Grooming Details (indicate cue type and reason): assist with balance Upper Body Bathing: Moderate assistance;Sitting-assist with  balance                             General ADL Comments: Pt SOB in session-took breaks.      Vision                     Perception     Praxis      Cognition  Awake/Alert Behavior During Therapy: WFL for tasks assessed/performed Overall Cognitive Status: No family/caregiver present to determine baseline cognitive functioning                       Extremity/Trunk Assessment               Exercises     Shoulder Instructions       General Comments      Pertinent Vitals/ Pain       Pain Assessment: 0-10 Pain Score:  (4-5) Pain Location: legs Pain Descriptors / Indicators: Burning Pain Intervention(s): Monitored during session;Repositioned  Home Living                                          Prior Functioning/Environment              Frequency Min 2X/week     Progress Toward Goals  OT Goals(current goals can now be found in the  care plan section)  Progress towards OT goals: Progressing toward goals  Acute Rehab OT Goals Patient Stated Goal: not stated OT Goal Formulation: With patient Time For Goal Achievement: 05/24/15 Potential to Achieve Goals: Good ADL Goals Pt Will Perform Grooming: sitting;with min guard assist Additional ADL Goal #1: Pt will roll to the Left and/or Right for bed pan placement with min  A  Plan Discharge plan remains appropriate    Co-evaluation                 End of Session Equipment Utilized During Treatment: Oxygen   Activity Tolerance Patient tolerated treatment well   Patient Left in bed;with call bell/phone within reach   Nurse Communication          Time: [id number] OT Time Calculation (min): 17 min  Charges: OT General Charges $OT Visit: 1 Procedure OT Treatments $Self Care/Home Management : 8-22 mins  Benito Mccreedy OTR/L [id number] 05/13/2015, 12:09 PM

## 2015-05-13 NOTE — Care Management Important Message (Signed)
Important Message  Patient Details  Name: Reginald [name] MRN: [id number] Date of Birth: 12-13-1935   Medicare Important Message Given:  Yes-second notification given    [name] May 05/13/2015, 11:44 AM

## 2015-05-13 NOTE — Progress Notes (Signed)
Foley catheter placed, pt tolerated well. 1200 ml clear, amber colored urine returned.

## 2015-05-14 ENCOUNTER — Inpatient Hospital Stay (HOSPITAL_COMMUNITY): Payer: Medicare Other

## 2015-05-14 LAB — CBC
HCT: 36.1 % — ABNORMAL LOW (ref 39.0–52.0)
Hemoglobin: 12 g/dL — ABNORMAL LOW (ref 13.0–17.0)
MCH: 28.7 pg (ref 26.0–34.0)
MCHC: 33.2 g/dL (ref 30.0–36.0)
MCV: 86.4 fL (ref 78.0–100.0)
PLATELETS: 303 10*3/uL (ref 150–400)
RBC: 4.18 MIL/uL — AB (ref 4.22–5.81)
RDW: 15.5 % (ref 11.5–15.5)
WBC: 16.3 10*3/uL — AB (ref 4.0–10.5)

## 2015-05-14 LAB — BASIC METABOLIC PANEL
ANION GAP: 14 (ref 5–15)
BUN: 95 mg/dL — AB (ref 6–20)
CHLORIDE: 104 mmol/L (ref 101–111)
CO2: 19 mmol/L — ABNORMAL LOW (ref 22–32)
Calcium: 8.1 mg/dL — ABNORMAL LOW (ref 8.9–10.3)
Creatinine, Ser: 3.92 mg/dL — ABNORMAL HIGH (ref 0.61–1.24)
GFR calc Af Amer: 16 mL/min — ABNORMAL LOW (ref >60)
GFR, EST NON AFRICAN AMERICAN: 14 mL/min — AB (ref >60)
GLUCOSE: 126 mg/dL — AB (ref 65–99)
POTASSIUM: 3.2 mmol/L — AB (ref 3.5–5.1)
Sodium: 137 mmol/L (ref 135–145)

## 2015-05-14 LAB — GLUCOSE, CAPILLARY
GLUCOSE-CAPILLARY: 105 mg/dL — AB (ref 65–99)
GLUCOSE-CAPILLARY: 116 mg/dL — AB (ref 65–99)
Glucose-Capillary: 120 mg/dL — ABNORMAL HIGH (ref 65–99)
Glucose-Capillary: 126 mg/dL — ABNORMAL HIGH (ref 65–99)
Glucose-Capillary: 102 mg/dL — ABNORMAL HIGH (ref 65–99)

## 2015-05-14 MED ORDER — POTASSIUM CHLORIDE 10 MEQ/100ML IV SOLN
10.0000 meq | INTRAVENOUS | Status: AC
  Administered 2015-05-14 (×4): 10 meq via INTRAVENOUS
  Filled 2015-05-14 (×4): qty 100

## 2015-05-14 MED ORDER — METOPROLOL TARTRATE 25 MG PO TABS
25.0000 mg | ORAL_TABLET | Freq: Three times a day (TID) | ORAL | Status: DC
  Administered 2015-05-14 – 2015-05-18 (×12): 25 mg via ORAL
  Filled 2015-05-14 (×12): qty 1

## 2015-05-14 MED ORDER — TAMSULOSIN HCL 0.4 MG PO CAPS
0.4000 mg | ORAL_CAPSULE | Freq: Every day | ORAL | Status: DC
  Administered 2015-05-14 – 2015-05-18 (×5): 0.4 mg via ORAL
  Filled 2015-05-14 (×5): qty 1

## 2015-05-14 MED ORDER — DEXTROSE 5 % IV SOLN
2.0000 g | INTRAVENOUS | Status: DC
  Administered 2015-05-14 – 2015-05-18 (×5): 2 g via INTRAVENOUS
  Filled 2015-05-14 (×5): qty 2

## 2015-05-14 NOTE — Progress Notes (Signed)
Subjective:  Denies any chest pain or shortness of breath states feels better. Her urine output improved after Foley catheter insertion. Renal ultrasound showed mild bilateral hydronephrosis. Renal function slowly improving. Urine appears to be cloudy.  Objective:  Vital Signs in the last 24 hours: Temp:  [98 F (36.7 C)-98.9 F (37.2 C)] 98 F (36.7 C) (11/02 2149) Pulse Rate:  [81-84] 81 (11/03 0538) Resp:  [18-20] 18 (11/03 0538) BP: (112-148)/(48-82) 112/48 mmHg (11/03 0538) SpO2:  [93 %-97 %] 93 % (11/03 0538) Weight:  [144.6 kg (318 lb 12.6 oz)] 144.6 kg (318 lb 12.6 oz) (11/03 0538)  Intake/Output from previous day: 11/02 0701 - 11/03 0700 In: 2822 [P.O.:822; I.V.:2000] Out: 4150 [Urine:4150] Intake/Output from this shift: Total I/O In: -  Out: 1000 [Urine:1000]  Physical Exam: Unchanged  Lab Results:  Recent Labs  05/13/15 0513 05/14/15 0606  WBC 20.7* 16.3*  HGB 12.9* 12.0*  PLT 319 303    Recent Labs  05/13/15 0513 05/14/15 0606  NA 133* 137  K 4.1 3.2*  CL 97* 104  CO2 19* 19*  GLUCOSE 184* 126*  BUN 86* 95*  CREATININE 4.30* 3.92*   No results for input(s): TROPONINI in the last 72 hours.  Invalid input(s): CK, MB Hepatic Function Panel No results for input(s): PROT, ALBUMIN, AST, ALT, ALKPHOS, BILITOT, BILIDIR, IBILI in the last 72 hours. No results for input(s): CHOL in the last 72 hours. No results for input(s): PROTIME in the last 72 hours.  Imaging: Imaging results have been reviewed and US Renal  05/13/2015  CLINICAL DATA:  Acute renal failure. EXAM: RENAL / URINARY TRACT ULTRASOUND COMPLETE COMPARISON:  CT scan of March 31, 1999 50 FINDINGS: Right Kidney: Length: 13.8 cm. Echogenicity within normal limits. No mass visualized. Mild hydronephrosis is noted. Left Kidney: Length: 13.7 cm. Echogenicity within normal limits. No mass visualized. Mild hydronephrosis is noted. Bladder: Appears normal for degree of bladder distention.  IMPRESSION: Mild bilateral hydronephrosis is noted without visualized obstructing calculus. Electronically Signed   By: Marijo Conception, M.D.   On: 05/13/2015 15:04    Cardiac Studies:  Assessment/Plan:  Cellulitis right leg associated with multiple ruptured blisters and small open wound Resolving hematoma status post fall Coronary artery disease history of PCI to LAD in remote past Hypertension Chronic atrial fibrillation with moderate ventricular response Diabetes mellitus Obstructive uropathy rule out benign hypertrophia of prostate Acute onChronic kidney disease stage III multifactorial  Anemia of chronic disease Hyperlipidemia Morbid obesity Obstructive sleep apnea Degenerative joint disease Constipation. Marked leukocytosis improved Rule out UTI Hypokalemia Plan Check urine culture sensitivity DC vancomycin Replace K Start Levaquin 500 mg daily Monitor renal function Start Flomax 0.4 mg daily Ambulate as tolerated Oral bed to chair Check labs in a.m.  LOS: 4 days    Charolette Forward 05/14/2015, 10:00 AM

## 2015-05-14 NOTE — Progress Notes (Signed)
Patient unresponsive to verbal stimuli. Briefly opened eyes slightly with sternal rub but otherwise not responding to any stimuli. Rapid response nurse called and notified of patient's condition. MD paged. 2 RN's and nurse tech at bedside, not following command for hand grasps. BP 120/70, HR 71, 02 94% on 2L/[place]. Patient randomly opened eyes and became alert as head of bed was being lowered to obtain rectal temp, which was 99.1. Rapid response nurse at bedside. MD returned phone call and gave telephone orders. Patient's mental status back at baseline. Awake and answering questions appropriately. Dr. Terrence Dupont at bedside. Patient stable at this time.

## 2015-05-14 NOTE — Clinical Social Work Note (Signed)
CSW talked with [facility] admissions director [name] T.) regarding patient and provided update. Nikki informed that patient may be ready for discharge on Friday, however CSW will f/u with her on 11/4.   Kalim Kissel Givens, MSW, LCSW Licensed Clinical Social Worker [place] [phone]

## 2015-05-14 NOTE — Consult Note (Signed)
Nephrology Consult  Patient name: Reginald [name] Medical record number: [id number] Date of birth: 03-31-1936 Age: 79 y.o. Gender: male  Primary Care Provider: Charolette Forward, MD  Pt Overview and Major Events to Date:   Pt. Is a 79 y/o M with hx of CKD III A, DMII (per pt. Last A1C 6.1 a few months ago.), Atrial Fibrillation, Chronic Anticoagulation, CHFpEF, pulmonary HTN, OSA.   Pt. Presented on 10/28 with worsening right sided leg pain, ecchymosis, and blisters with concern for cellulitis. He had developed ecchymosis / hematoma after a mechanical fall on 10/26 but was discharged home. His pain worsened and he developed small blisters as above. He did not have any fever or chills at the time. He was admitted and started on broad spectrum antibiotics for Cellulitis. Due to concern that this might be SJS his xarelto was discontinued. General Surgery was consulted and felt that there was nothing to do from their standpoint. He has had some improvement since admission, but did have Fever on 10/28 and 10/30 while on Vancomycin with uptrending WBC from 11.7 > 20. This with a new O2 requirement over the past two days. His creatinine baseline appears to be around 1.4 which began to spike on 11/1 to 2.58 with subsequent drop in UOP to around 0.5L. He has not received contrast. He has received Lasix as recently as yesterday.   Assessment and Plan: 79 y/o M here with Cellulitis, and ATN acute on chronic kidney disease, Atrial Fibrillation.   ATN, Oliguria - acute on chronic kidney disease. Baseline Cr near 1.4. Has stabilized with a small drop from 4.3 to 3.92. UOP has increased to around 5L in the past 24 hours. Net negative 6L. Renal ultrasound with large bladder / mild hydronephrosis. Return of 1200cc with foley placement yesterday. Rebound diuresis.  - Continue MIVF. Monitor I/O's. Do not want him to get dry as UOP picks up.  - Hold nephrotoxic meds lasix, lisinopril.  - Continue foley catheter for now.    - No phos or magnesium based cathartics.   Hypokalemia  - repleted with K x 4.   Cellulitis / Resolving Hematoma - Unable to examine due to dressing. Blisters noted near his ankle. On Vancomycin. WBC down today. Afebrile overnight. He feels that his leg is about the same.   - Mgmt per primary team.     SOB - pt. With [place] 2L in place. Says he doesn't overtly feel SOB. Obese with known OSA cpap dependent at night. Looks like he is working some, though RR is not tachypneic. No documented desaturations. Crackles on lung exam noted.  - Recommend CXR - Ongoing mgmt per primary team.    Afib - Dr. Terrence Dupont is managing. Rate controlled on amiodarone. No Xarelto at this time.   HTN - per primary team.   Constipation - BM this am.  - prn stool softener.   PPx: heparin sub q.   Disposition: pending improvement.   Subjective:  Pt. Says his leg feels about the same this am, but overall feels a little better. Says he has been putting out lots of urine. Does not complain of frequency or nocturia at home except with fluid pill. He says that he has never been told that he has BPH.   Objective: Temp:  [98 F (36.7 C)-98.9 F (37.2 C)] 98 F (36.7 C) (11/02 2149) Pulse Rate:  [81-84] 81 (11/03 0538) Resp:  [18-20] 18 (11/03 0538) BP: (112-148)/(48-82) 112/48 mmHg (11/03 0538) SpO2:  [93 %-97 %]  93 % (11/03 0538) Weight:  [318 lb 12.6 oz (144.6 kg)] 318 lb 12.6 oz (144.6 kg) (11/03 0538) Physical Exam: General: NAD, laying in bed, morbidly obese.  Cardiovascular: Irregularly Irregular. Respiratory: Crackles in the BL bases to the mid lung field L> R  Abdomen: Large, obese, soft, non-distended this am, and no discomfort.  Extremities: RLE with bandage in place, some blistering below his bandage on his ankle. LLE with bandage to shin ulcer. Some trace - 1+ edema of his BL LE.   Laboratory:  Recent Labs Lab 05/12/15 0535 05/13/15 0513 05/14/15 0606  WBC 19.1* 20.7* 16.3*  HGB 12.6* 12.9*  12.0*  HCT 39.3 38.6* 36.1*  PLT 291 319 303    Recent Labs Lab 05/08/15 0435  05/12/15 0535 05/13/15 0513 05/14/15 0606  NA 141  < > 135 133* 137  K 3.9  < > 3.8 4.1 3.2*  CL 105  < > 101 97* 104  CO2 30  < > 20* 19* 19*  BUN 17  < > 52* 86* 95*  CREATININE 1.46*  < > 2.58* 4.30* 3.92*  CALCIUM 8.7*  < > 8.7* 8.4* 8.1*  PROT 5.5*  --   --   --   --   BILITOT 1.0  --   --   --   --   ALKPHOS 58  --   --   --   --   ALT 21  --   --   --   --   AST 18  --   --   --   --   GLUCOSE 138*  < > 191* 184* 126*  < > = values in this interval not displayed.     [name], MD 05/14/2015, 8:33 AM PGY-2, [place]

## 2015-05-14 NOTE — Progress Notes (Signed)
Nutrition Follow-up  DOCUMENTATION CODES:   Morbid obesity  INTERVENTION:   -Continue Boost Breeze po BID, each supplement provides 250 kcal and 9 grams of protein -Continue MVI daily  NUTRITION DIAGNOSIS:   Increased nutrient needs related to wound healing as evidenced by estimated needs.  Ongoing  GOAL:   Patient will meet greater than or equal to 90% of their needs  Progressing  MONITOR:   PO intake, Supplement acceptance, Labs, Weight trends, Skin, I & O's  REASON FOR ASSESSMENT:   Consult Poor PO  ASSESSMENT:   Reginald [name] is a 79 y.o. male with Past medical history of atrial fibrillation on chronic anticoagulation, obstructive sleep apnea on C Pap, essential hypertension, chronic kidney disease, type 2 diabetes mellitus, chronic venous stasis.  Pt sleeping soundly at time of visit.   Pt was administered a soap suds enema on 05/12/15; last BM on 05/13/14.   Meal completion improving (10-50%) per doc flowsheets. Pt is taking Boost Breeze supplements. RD will continue order.   CSW following for SNF placement at discharge. Pt has 3 bed offers.   Reviewed [facility] note from 05/11/15; two areas of rt knee wounds have progressed to eschar- enzymatic debridement will be added.   Labs reviewed: K: 3.2 (on supplement). CBGS improving (102-126).    Diet Order:  Diet heart healthy/carb modified Room service appropriate?: Yes; Fluid consistency:: Thin  Skin:  Wound (see comment) (eschar rt knee wound, bilateral LLE cellulitis)  Last BM:  05/14/15  Height:   Ht Readings from Last 1 Encounters:  05/08/15 5\' 10"  (1.778 m)    Weight:   Wt Readings from Last 1 Encounters:  05/14/15 318 lb 12.6 oz (144.6 kg)    Ideal Body Weight:  75.5 kg  BMI:  Body mass index is 45.74 kg/(m^2).  Estimated Nutritional Needs:   Kcal:  2200-2400  Protein:  120-135 grams  Fluid:  2.2-2.4 L  EDUCATION NEEDS:   Education needs addressed  Nilson Tabora A. Jimmye Norman, RD, LDN,  CDE Pager: [phone] After hours Pager: [phone]

## 2015-05-15 LAB — CBC
HCT: 33.2 % — ABNORMAL LOW (ref 39.0–52.0)
Hemoglobin: 11 g/dL — ABNORMAL LOW (ref 13.0–17.0)
MCH: 28.6 pg (ref 26.0–34.0)
MCHC: 33.1 g/dL (ref 30.0–36.0)
MCV: 86.5 fL (ref 78.0–100.0)
PLATELETS: 261 10*3/uL (ref 150–400)
RBC: 3.84 MIL/uL — ABNORMAL LOW (ref 4.22–5.81)
RDW: 15.7 % — AB (ref 11.5–15.5)
WBC: 12.9 10*3/uL — AB (ref 4.0–10.5)

## 2015-05-15 LAB — GLUCOSE, CAPILLARY
GLUCOSE-CAPILLARY: 108 mg/dL — AB (ref 65–99)
GLUCOSE-CAPILLARY: 180 mg/dL — AB (ref 65–99)
Glucose-Capillary: 205 mg/dL — ABNORMAL HIGH (ref 65–99)
Glucose-Capillary: 151 mg/dL — ABNORMAL HIGH (ref 65–99)

## 2015-05-15 LAB — BASIC METABOLIC PANEL
Anion gap: 12 (ref 5–15)
BUN: 75 mg/dL — AB (ref 6–20)
CALCIUM: 8.1 mg/dL — AB (ref 8.9–10.3)
CO2: 21 mmol/L — ABNORMAL LOW (ref 22–32)
CREATININE: 2.61 mg/dL — AB (ref 0.61–1.24)
Chloride: 105 mmol/L (ref 101–111)
GFR calc Af Amer: 26 mL/min — ABNORMAL LOW (ref >60)
GFR, EST NON AFRICAN AMERICAN: 22 mL/min — AB (ref >60)
GLUCOSE: 110 mg/dL — AB (ref 65–99)
Potassium: 3.5 mmol/L (ref 3.5–5.1)
SODIUM: 138 mmol/L (ref 135–145)

## 2015-05-15 LAB — MAGNESIUM: Magnesium: 2.6 mg/dL — ABNORMAL HIGH (ref 1.7–2.4)

## 2015-05-15 MED ORDER — HYDROCODONE-ACETAMINOPHEN 5-325 MG PO TABS
1.0000 | ORAL_TABLET | ORAL | Status: DC | PRN
  Administered 2015-05-15 – 2015-05-18 (×13): 1 via ORAL
  Filled 2015-05-15 (×13): qty 1

## 2015-05-15 MED ORDER — HYDROCODONE-ACETAMINOPHEN 5-325 MG PO TABS
1.0000 | ORAL_TABLET | Freq: Three times a day (TID) | ORAL | Status: DC | PRN
  Administered 2015-05-15: 1 via ORAL
  Filled 2015-05-15: qty 1

## 2015-05-15 NOTE — Progress Notes (Signed)
Subjective: denies any chest pain or shortness of breath.  States overall feels better.  Renal function gradually improving.  Urine more clear now.  Off anticoagulants due to hematoma, right leg, which is improving.  We'll start Xarelto once renal function back to baseline  hopefully from tomorrow.  Objective:  Vital Signs in the last 24 hours: Temp:  [98.6 F (37 C)-98.9 F (37.2 C)] 98.9 F (37.2 C) (11/04 0512) Pulse Rate:  [71-87] 86 (11/04 0512) Resp:  [18-20] 20 (11/04 0512) BP: (119-141)/(52-70) 141/70 mmHg (11/04 0512) SpO2:  [93 %-95 %] 93 % (11/04 0512) Weight:  [130.2 kg (287 lb 0.6 oz)] 130.2 kg (287 lb 0.6 oz) (11/04 0512)  Intake/Output from previous day: 11/03 0701 - 11/04 0700 In: 5039.1 [P.O.:320; I.V.:3919.1] Out: 4800 [Urine:4800] Intake/Output from this shift: Total I/O In: 120 [P.O.:120] Out: -   Physical Exam: Neck: no adenopathy, no carotid bruit, no JVD and supple, symmetrical, trachea midline Lungs: clear anterolaterally Heart: irregularly irregular rhythm, S1, S2 normal and soft systolic murmur noted Abdomen: soft, non-tender; bowel sounds normal; no masses,  no organomegaly Extremities: no clubbing, cyanosis, 2+ edema noted.  Surgical dressing noted  Lab Results:  Recent Labs  05/14/15 0606 05/15/15 0539  WBC 16.3* 12.9*  HGB 12.0* 11.0*  PLT 303 261    Recent Labs  05/14/15 0606 05/15/15 0539  NA 137 138  K 3.2* 3.5  CL 104 105  CO2 19* 21*  GLUCOSE 126* 110*  BUN 95* 75*  CREATININE 3.92* 2.61*   No results for input(s): TROPONINI in the last 72 hours.  Invalid input(s): CK, MB Hepatic Function Panel No results for input(s): PROT, ALBUMIN, AST, ALT, ALKPHOS, BILITOT, BILIDIR, IBILI in the last 72 hours. No results for input(s): CHOL in the last 72 hours. No results for input(s): PROTIME in the last 72 hours.  Imaging: Imaging results have been reviewed and Ct Head Wo Contrast  05/14/2015  CLINICAL DATA:  Altered  consciousness. EXAM: CT HEAD WITHOUT CONTRAST TECHNIQUE: Contiguous axial images were obtained from the base of the skull through the vertex without intravenous contrast. COMPARISON:  CT head 08/11/2010 FINDINGS: Moderate atrophy. Chronic microvascular ischemic change in the white matter. Negative for acute infarct.  Negative for hemorrhage or mass. Calvarium intact mild mucosal edema in the ethmoid sinuses. IMPRESSION: Atrophy and chronic microvascular ischemia.  No acute abnormality. Electronically Signed   By: Franchot Gallo M.D.   On: 05/14/2015 13:31   US Renal  05/13/2015  CLINICAL DATA:  Acute renal failure. EXAM: RENAL / URINARY TRACT ULTRASOUND COMPLETE COMPARISON:  CT scan of March 31, 1999 50 FINDINGS: Right Kidney: Length: 13.8 cm. Echogenicity within normal limits. No mass visualized. Mild hydronephrosis is noted. Left Kidney: Length: 13.7 cm. Echogenicity within normal limits. No mass visualized. Mild hydronephrosis is noted. Bladder: Appears normal for degree of bladder distention. IMPRESSION: Mild bilateral hydronephrosis is noted without visualized obstructing calculus. Electronically Signed   By: Marijo Conception, M.D.   On: 05/13/2015 15:04    Cardiac Studies:  Assessment/Plan:  Cellulitis right leg associated with multiple ruptured blisters and small open wound Resolving hematoma status post fall Coronary artery disease history of PCI to LAD in remote past Hypertension Chronic atrial fibrillation with moderate ventricular response Diabetes mellitus Obstructive uropathy rule out benign hypertrophia of prostate Acute onChronic kidney disease stage III multifactorial  Anemia of chronic disease Hyperlipidemia Morbid obesity Obstructive sleep apnea Degenerative joint disease Constipation. Marked leukocytosis improved Probable UTI. Plan Check cultures.  Restart Xarelto tomorrow.  Once renal function back to baseline  Check labs in a.m. Dr. Doylene Canard on call for  weekend. Possible discharge to skilled nursing facility during weekend if stable  LOS: 5 days    Charolette Forward 05/15/2015, 11:01 AM

## 2015-05-15 NOTE — Progress Notes (Signed)
Physical Therapy Treatment Patient Details Name: Reginald [name] MRN: [id number] DOB: 1936-02-04 Today's Date: 05/15/2015    History of Present Illness Pt admitted s/p fall with progressive swelling and blistering lower legs.  He is a 79 y.o. male with Past medical history of atrial fibrillation on chronic anticoagulation, obstructive sleep apnea on C Pap, essential hypertension, chronic kidney disease, type 2 diabetes mellitus, chronic venous stasis.    PT Comments    Patient progressing slowly towards PT goals. Able to assist more with bed mobility today using UEs. Able to clear bottom x2 from bed using stedy and assist x2. Some sores noted on bottom. Tech present and aware. Fatigues easily with activity but motivated to improve. Dyspnea noted. Encouraged exercise and AROM BLEs in bed. RLE tender to light touch. Will follow acutely per current POC.   Follow Up Recommendations  SNF;Supervision/Assistance - 24 hour     Equipment Recommendations  Other (comment) (defer to next venue)    Recommendations for Other Services       Precautions / Restrictions Precautions Precautions: Fall Precaution Comments: prior fall at home while reaching for item off lower surface Restrictions Weight Bearing Restrictions: No    Mobility  Bed Mobility Overal bed mobility: Needs Assistance Bed Mobility: Rolling Rolling: Max assist   Supine to sit: Max assist;+2 for physical assistance Sit to supine: Max assist;+2 for physical assistance   General bed mobility comments: Assist with RLE and to elevate trunk to get to EOB. Able to reach across body to use rail to pull up. Trendelenburg position to scoot to [facility] using UEs.  Rolling to right/left assist of 2 for pericare.  Transfers Overall transfer level: Needs assistance   Transfers: Sit to/from Stand Sit to Stand: From elevated surface         General transfer comment: Performed sit to partial stand x2 using UEs to pull up on bari stedy  and elevated bed height. Able to partially clear bottom on 1st bout and fully clear on second bout.  Ambulation/Gait                 Stairs            Wheelchair Mobility    Modified Rankin (Stroke Patients Only)       Balance Overall balance assessment: Needs assistance Sitting-balance support: Feet supported;Bilateral upper extremity supported Sitting balance-Leahy Scale: Poor Sitting balance - Comments: Tolerated sitting EOB 12 minues without stedy with UE support. LOB x2 to right when sitting unsupported.  Postural control: Right lateral lean;Posterior lean                          Cognition Arousal/Alertness: Awake/alert Behavior During Therapy: WFL for tasks assessed/performed Overall Cognitive Status: No family/caregiver present to determine baseline cognitive functioning                      Exercises      General Comments        Pertinent Vitals/Pain Pain Assessment: Faces Faces Pain Scale: Hurts even more Pain Location: RLE with movement/touch Pain Descriptors / Indicators: Sore;Constant;Aching Pain Intervention(s): Monitored during session;Repositioned;Limited activity within patient's tolerance    Home Living                      Prior Function            PT Goals (current goals can now be found in the care plan  section) Progress towards PT goals: Progressing toward goals (slowly)    Frequency  Min 2X/week    PT Plan Current plan remains appropriate    Co-evaluation             End of Session Equipment Utilized During Treatment: Oxygen Activity Tolerance: Patient limited by pain;Patient limited by fatigue Patient left: in bed;with call bell/phone within reach;with bed alarm set     Time: [id number] PT Time Calculation (min) (ACUTE ONLY): 30 min  Charges:  $Therapeutic Activity: 23-37 mins                    G Codes:      Kamerin Axford A Viral Schramm 05/15/2015, 2:26 PM Wray Kearns, [place],  DPT [phone]

## 2015-05-15 NOTE — Consult Note (Signed)
Nephrology Consult  Patient name: Reginald [name] Medical record number: [id number] Date of birth: 1936/05/01 Age: 79 y.o. Gender: male  Primary Care Provider: Charolette Forward, MD  Pt Overview and Major Events to Date:   Pt. Is a 79 y/o M with hx of CKD III A, DMII (per pt. Last A1C 6.1 a few months ago.), Atrial Fibrillation, Chronic Anticoagulation, CHFpEF, pulmonary HTN, OSA.   Pt. Presented on 10/28 with worsening right sided leg pain, ecchymosis, and blisters with concern for cellulitis. He had developed ecchymosis / hematoma after a mechanical fall on 10/26 but was discharged home. His pain worsened and he developed small blisters as above. He did not have any fever or chills at the time. He was admitted and started on broad spectrum antibiotics for Cellulitis. Due to concern that this might be SJS his xarelto was discontinued. General Surgery was consulted and felt that there was nothing to do from their standpoint. His creatinine baseline appears to be around 1.4 which began to spike on 11/1 to 2.58 with subsequent drop in UOP to around 0.5L. He has not received contrast. He has received Lasix during this hospitalization.   Assessment and Plan: 79 y/o M here with Cellulitis, and ATN acute on chronic kidney disease, Atrial Fibrillation.   Postrenal AKI- acute on chronic kidney disease. Baseline Cr near 1.4. Down today to 2.6. UOP has increased to around 5L in the past 24 hours. Net negative 4L. Renal ultrasound with large bladder / mild hydronephrosis. And large volume return with foley placement. Rebound diuresis.  - Stop MIVF today. Monitor I/O's.   - Hold nephrotoxic meds lasix, lisinopril.  - Pull foley today for voiding trial.  - No phos or magnesium based cathartics.  - Mg 2.6 today. Recheck in the am.  - On flomax.  - Will sign off today. Expect him to do well.   Hypokalemia - resolved.    Cellulitis / Resolving Hematoma - Unable to examine due to dressing. Visible areas  improving. On Vancomycin. WBC down again today. Afebrile overnight.  - Mgmt per primary team.     SOB - Improving. On room air. No desaturations.  - Ongoing mgmt per primary team.    Afib - Dr. Terrence Dupont is managing. Rate controlled on amiodarone. No Xarelto at this time.   HTN - per primary team.   Constipation - BM this am.  - prn stool softener.   PPx: heparin sub q.   Disposition: pending improvement.   Subjective:  Pt. Says his leg feels about the same this am, but overall feels a little better. Says he has been putting out lots of urine. Does not complain of frequency or nocturia at home except with fluid pill. He says that he has never been told that he has BPH.   Objective: Temp:  [98.6 F (37 C)-98.9 F (37.2 C)] 98.9 F (37.2 C) (11/04 0512) Pulse Rate:  [71-87] 86 (11/04 0512) Resp:  [18-20] 20 (11/04 0512) BP: (119-141)/(52-70) 141/70 mmHg (11/04 0512) SpO2:  [93 %-95 %] 93 % (11/04 0512) Weight:  [287 lb 0.6 oz (130.2 kg)] 287 lb 0.6 oz (130.2 kg) (11/04 [id number]) Physical Exam: General: NAD, laying in bed, morbidly obese.  Cardiovascular: Irregularly Irregular. Respiratory: Crackles in the BL bases to the mid lung field L> R  Abdomen: Large, obese, soft, non-distended this am, and no discomfort.  Extremities: RLE with bandage in place, some blistering below his bandage on his ankle. LLE with bandage to shin ulcer. Some  trace - 1+ edema of his BL LE.   Laboratory:  Recent Labs Lab 05/13/15 0513 05/14/15 0606 05/15/15 0539  WBC 20.7* 16.3* 12.9*  HGB 12.9* 12.0* 11.0*  HCT 38.6* 36.1* 33.2*  PLT 319 303 261    Recent Labs Lab 05/13/15 0513 05/14/15 0606 05/15/15 0539  NA 133* 137 138  K 4.1 3.2* 3.5  CL 97* 104 105  CO2 19* 19* 21*  BUN 86* 95* 75*  CREATININE 4.30* 3.92* 2.61*  CALCIUM 8.4* 8.1* 8.1*  GLUCOSE 184* 126* 110*       [name], MD 05/15/2015, 8:44 AM PGY-2, [place]

## 2015-05-15 NOTE — Progress Notes (Signed)
Patient refusing CPAP for tonight. 

## 2015-05-16 DIAGNOSIS — L8990 Pressure ulcer of unspecified site, unspecified stage: Secondary | ICD-10-CM | POA: Insufficient documentation

## 2015-05-16 LAB — URINE CULTURE: CULTURE: NO GROWTH

## 2015-05-16 LAB — CBC
HEMATOCRIT: 34.6 % — AB (ref 39.0–52.0)
HEMOGLOBIN: 11.3 g/dL — AB (ref 13.0–17.0)
MCH: 28.5 pg (ref 26.0–34.0)
MCHC: 32.7 g/dL (ref 30.0–36.0)
MCV: 87.2 fL (ref 78.0–100.0)
Platelets: 358 10*3/uL (ref 150–400)
RBC: 3.97 MIL/uL — ABNORMAL LOW (ref 4.22–5.81)
RDW: 15.8 % — AB (ref 11.5–15.5)
WBC: 15 10*3/uL — ABNORMAL HIGH (ref 4.0–10.5)

## 2015-05-16 LAB — BASIC METABOLIC PANEL
ANION GAP: 10 (ref 5–15)
BUN: 67 mg/dL — AB (ref 6–20)
CHLORIDE: 105 mmol/L (ref 101–111)
CO2: 23 mmol/L (ref 22–32)
Calcium: 8.2 mg/dL — ABNORMAL LOW (ref 8.9–10.3)
Creatinine, Ser: 2.37 mg/dL — ABNORMAL HIGH (ref 0.61–1.24)
GFR calc Af Amer: 29 mL/min — ABNORMAL LOW (ref >60)
GFR, EST NON AFRICAN AMERICAN: 25 mL/min — AB (ref >60)
GLUCOSE: 159 mg/dL — AB (ref 65–99)
POTASSIUM: 3.8 mmol/L (ref 3.5–5.1)
Sodium: 138 mmol/L (ref 135–145)

## 2015-05-16 LAB — GLUCOSE, CAPILLARY
GLUCOSE-CAPILLARY: 209 mg/dL — AB (ref 65–99)
Glucose-Capillary: 142 mg/dL — ABNORMAL HIGH (ref 65–99)
Glucose-Capillary: 132 mg/dL — ABNORMAL HIGH (ref 65–99)
Glucose-Capillary: 180 mg/dL — ABNORMAL HIGH (ref 65–99)

## 2015-05-16 LAB — MAGNESIUM: Magnesium: 2.6 mg/dL — ABNORMAL HIGH (ref 1.7–2.4)

## 2015-05-16 NOTE — Progress Notes (Signed)
Ref: Charolette Forward, MD   Subjective:  Feeling same. No chest pain or shortness of breath. Secral pressure ulcers are identified.  Objective:  Vital Signs in the last 24 hours: Temp:  [98.6 F (37 C)-99.3 F (37.4 C)] 98.6 F (37 C) (11/05 0546) Pulse Rate:  [83-93] 83 (11/05 0837) Cardiac Rhythm:  [-] Atrial fibrillation (11/05 0701) Resp:  [16-20] 18 (11/05 0546) BP: (114-152)/(57-78) 152/78 mmHg (11/05 0837) SpO2:  [92 %-98 %] 95 % (11/05 0546) Weight:  [130.5 kg (287 lb 11.2 oz)] 130.5 kg (287 lb 11.2 oz) (11/05 0546)  Physical Exam: BP Readings from Last 1 Encounters:  05/16/15 152/78    Wt Readings from Last 1 Encounters:  05/16/15 130.5 kg (287 lb 11.2 oz)    Weight change: 0.3 kg (10.6 oz)  HEENT: [place]/AT, Eyes-Brown, PERL, EOMI, Conjunctiva-Pink, Sclera-Non-icteric Neck: No JVD, No bruit, Trachea midline. Lungs:  Clear, Bilateral. Cardiac:  Regular rhythm, normal S1 and S2, no S3.  Abdomen:  Soft, non-tender. Extremities:  No edema present. No cyanosis. No clubbing. Right leg dressing over large several ulcers extending up to knees. Small and healing ulcer on left knee and left leg above the ankle. Large area of redness of skin in crack of the butt and small 3 pressure ulcers (1/4 '' to 1/2 '' area) on left buttocks. CNS: AxOx3, Cranial nerves grossly intact, moves all 4 extremities. Right handed. Skin: Warm and dry.   Intake/Output from previous day: 11/04 0701 - 11/05 0700 In: 390 [P.O.:340; IV Piggyback:50] Out: 975 [Urine:975]    Lab Results: BMET    Component Value Date/Time   NA 138 05/16/2015 0438   NA 138 05/15/2015 0539   NA 137 05/14/2015 0606   K 3.8 05/16/2015 0438   K 3.5 05/15/2015 0539   K 3.2* 05/14/2015 0606   CL 105 05/16/2015 0438   CL 105 05/15/2015 0539   CL 104 05/14/2015 0606   CO2 23 05/16/2015 0438   CO2 21* 05/15/2015 0539   CO2 19* 05/14/2015 0606   GLUCOSE 159* 05/16/2015 0438   GLUCOSE 110* 05/15/2015 0539   GLUCOSE 126*  05/14/2015 0606   BUN 67* 05/16/2015 0438   BUN 75* 05/15/2015 0539   BUN 95* 05/14/2015 0606   CREATININE 2.37* 05/16/2015 0438   CREATININE 2.61* 05/15/2015 0539   CREATININE 3.92* 05/14/2015 0606   CALCIUM 8.2* 05/16/2015 0438   CALCIUM 8.1* 05/15/2015 0539   CALCIUM 8.1* 05/14/2015 0606   GFRNONAA 25* 05/16/2015 0438   GFRNONAA 22* 05/15/2015 0539   GFRNONAA 14* 05/14/2015 0606   GFRAA 29* 05/16/2015 0438   GFRAA 26* 05/15/2015 0539   GFRAA 16* 05/14/2015 0606   CBC    Component Value Date/Time   WBC 15.0* 05/16/2015 0438   RBC 3.97* 05/16/2015 0438   HGB 11.3* 05/16/2015 0438   HCT 34.6* 05/16/2015 0438   PLT 358 05/16/2015 0438   MCV 87.2 05/16/2015 0438   MCH 28.5 05/16/2015 0438   MCHC 32.7 05/16/2015 0438   RDW 15.8* 05/16/2015 0438   LYMPHSABS 1.4 05/07/2015 2237   MONOABS 1.2* 05/07/2015 2237   EOSABS 0.2 05/07/2015 2237   BASOSABS 0.0 05/07/2015 2237   HEPATIC Function Panel  Recent Labs  10/31/14 1340 05/05/15 2051 05/08/15 0435  PROT 6.0 5.6* 5.5*   HEMOGLOBIN A1C No components found for: HGA1C,  MPG CARDIAC ENZYMES Lab Results  Component Value Date   CKTOTAL 549* 05/13/2015   CKMB 4.4* 08/12/2010   TROPONINI 0.03  NO INDICATION OF MYOCARDIAL INJURY. 08/12/2010   TROPONINI 0.04        NO INDICATION OF MYOCARDIAL INJURY. 08/12/2010   TROPONINI 0.05        NO INDICATION OF MYOCARDIAL INJURY. 08/11/2010   BNP No results for input(s): PROBNP in the last 8760 hours. TSH No results for input(s): TSH in the last 8760 hours. CHOLESTEROL No results for input(s): CHOL in the last 8760 hours.  Scheduled Meds: . acidophilus  1 capsule Oral QHS  . amiodarone  200 mg Oral QHS  . atorvastatin  20 mg Oral q1800  . cefTRIAXone (ROCEPHIN)  IV  2 g Intravenous Q24H  . collagenase   Topical Daily  . feeding supplement  1 Container Oral BID BM  . heparin subcutaneous  5,000 Units Subcutaneous 3 times per day  . insulin aspart  0-15 Units  Subcutaneous TID WC  . insulin aspart  0-5 Units Subcutaneous QHS  . latanoprost  1 drop Both Eyes QHS  . metoprolol tartrate  25 mg Oral TID  . multivitamin with minerals  1 tablet Oral Daily  . sodium chloride  3 mL Intravenous Q12H  . tamsulosin  0.4 mg Oral Daily   Continuous Infusions:  PRN Meds:.acetaminophen **OR** acetaminophen, HYDROcodone-acetaminophen, ondansetron **OR** ondansetron (ZOFRAN) IV, senna-docusate  Assessment/Plan: Cellulitis right leg associated with multiple ruptured blisters and small open wound Resolving hematoma status post fall Coronary artery disease history of PCI to LAD in remote past Hypertension Chronic atrial fibrillation with moderate ventricular response Diabetes mellitus Obstructive uropathy rule out benign hypertrophia of prostate Acute onChronic kidney disease stage III multifactorial  Anemia of chronic disease Hyperlipidemia Morbid obesity Obstructive sleep apnea Degenerative joint disease Constipation. Marked leukocytosis improved Probable UTI. Sacral decubitus ulcers, stage II     LOS: 6 days    Dixie Dials  MD  05/16/2015, 12:24 PM

## 2015-05-16 NOTE — Progress Notes (Signed)
Old dressing on RLE removed. Santyl placed on eschar areas as ordered, Xeroform gauze placed, then wrapped with Kerlix. Dressing clean, dry and intact. 2 small ulcers noted on left buttock and moisture associated skin damage is noted on sacral area/buttocks. Condom catheter placed. Pt tolerated dressing change well. Will continue to monitor pt. Bed remains in lowest position and call bell is within reach.

## 2015-05-17 ENCOUNTER — Inpatient Hospital Stay (HOSPITAL_COMMUNITY): Payer: Medicare Other

## 2015-05-17 LAB — GLUCOSE, CAPILLARY
GLUCOSE-CAPILLARY: 161 mg/dL — AB (ref 65–99)
Glucose-Capillary: 131 mg/dL — ABNORMAL HIGH (ref 65–99)
Glucose-Capillary: 224 mg/dL — ABNORMAL HIGH (ref 65–99)
Glucose-Capillary: 141 mg/dL — ABNORMAL HIGH (ref 65–99)

## 2015-05-17 NOTE — Care Management Important Message (Signed)
Important Message  Patient Details  Name: Reginald [name] MRN: [id number] Date of Birth: 04/30/1936   Medicare Important Message Given:  Yes-third notification given    [name] May 05/17/2015, 10:15 AM

## 2015-05-17 NOTE — Procedures (Signed)
Pt refuses the use of CPAP.  

## 2015-05-17 NOTE — Progress Notes (Signed)
Pt was having irregular heart rate with V- Tach with no chest pain, physician on call was notified called me back on phone with an order to continue to monitor

## 2015-05-17 NOTE — Progress Notes (Signed)
Patient refuses CPAP for tonight.  

## 2015-05-17 NOTE — Progress Notes (Signed)
Ref: Reginald Forward, MD   Subjective:  No new complaint. Elevated WBC count. Lower leg dressing without drainage.  Objective:  Vital Signs in the last 24 hours: Temp:  [98.2 F (36.8 C)-99.3 F (37.4 C)] 98.2 F (36.8 C) (11/06 0544) Pulse Rate:  [74-100] 100 (11/06 1016) Cardiac Rhythm:  [-] Atrial fibrillation (11/06 0700) Resp:  [18-20] 20 (11/06 0544) BP: (132-155)/(64-76) 148/73 mmHg (11/06 1016) SpO2:  [93 %-96 %] 93 % (11/06 0544) Weight:  [130.5 kg (287 lb 11.2 oz)] 130.5 kg (287 lb 11.2 oz) (11/06 0544)  Physical Exam: BP Readings from Last 1 Encounters:  05/17/15 148/73    Wt Readings from Last 1 Encounters:  05/17/15 130.5 kg (287 lb 11.2 oz)    Weight change: 0 kg (0 lb)  HEENT: [place]/AT, Eyes-Brown, PERL, EOMI, Conjunctiva-Pink, Sclera-Non-icteric Neck: No JVD, No bruit, Trachea midline. Lungs:  Clear, Bilateral. Cardiac:  Regular rhythm, normal S1 and S2, no S3.  Abdomen:  Soft, non-tender. Extremities:  No edema present. No cyanosis. No clubbing.Sacral decubiti and lower leg wounds are unchanged. CNS: AxOx3, Cranial nerves grossly intact, moves all 4 extremities. Right handed. Skin: Warm and dry.   Intake/Output from previous day: 11/05 0701 - 11/06 0700 In: 710 [P.O.:660; IV Piggyback:50] Out: [id number] [Urine:1550]    Lab Results: BMET    Component Value Date/Time   NA 138 05/16/2015 0438   NA 138 05/15/2015 0539   NA 137 05/14/2015 0606   K 3.8 05/16/2015 0438   K 3.5 05/15/2015 0539   K 3.2* 05/14/2015 0606   CL 105 05/16/2015 0438   CL 105 05/15/2015 0539   CL 104 05/14/2015 0606   CO2 23 05/16/2015 0438   CO2 21* 05/15/2015 0539   CO2 19* 05/14/2015 0606   GLUCOSE 159* 05/16/2015 0438   GLUCOSE 110* 05/15/2015 0539   GLUCOSE 126* 05/14/2015 0606   BUN 67* 05/16/2015 0438   BUN 75* 05/15/2015 0539   BUN 95* 05/14/2015 0606   CREATININE 2.37* 05/16/2015 0438   CREATININE 2.61* 05/15/2015 0539   CREATININE 3.92* 05/14/2015 0606   CALCIUM  8.2* 05/16/2015 0438   CALCIUM 8.1* 05/15/2015 0539   CALCIUM 8.1* 05/14/2015 0606   GFRNONAA 25* 05/16/2015 0438   GFRNONAA 22* 05/15/2015 0539   GFRNONAA 14* 05/14/2015 0606   GFRAA 29* 05/16/2015 0438   GFRAA 26* 05/15/2015 0539   GFRAA 16* 05/14/2015 0606   CBC    Component Value Date/Time   WBC 15.0* 05/16/2015 0438   RBC 3.97* 05/16/2015 0438   HGB 11.3* 05/16/2015 0438   HCT 34.6* 05/16/2015 0438   PLT 358 05/16/2015 0438   MCV 87.2 05/16/2015 0438   MCH 28.5 05/16/2015 0438   MCHC 32.7 05/16/2015 0438   RDW 15.8* 05/16/2015 0438   LYMPHSABS 1.4 05/07/2015 2237   MONOABS 1.2* 05/07/2015 2237   EOSABS 0.2 05/07/2015 2237   BASOSABS 0.0 05/07/2015 2237   HEPATIC Function Panel  Recent Labs  10/31/14 1340 05/05/15 2051 05/08/15 0435  PROT 6.0 5.6* 5.5*   HEMOGLOBIN A1C No components found for: HGA1C,  MPG CARDIAC ENZYMES Lab Results  Component Value Date   CKTOTAL 549* 05/13/2015   CKMB 4.4* 08/12/2010   TROPONINI 0.03        NO INDICATION OF MYOCARDIAL INJURY. 08/12/2010   TROPONINI 0.04        NO INDICATION OF MYOCARDIAL INJURY. 08/12/2010   TROPONINI 0.05        NO INDICATION OF MYOCARDIAL INJURY. 08/11/2010  BNP No results for input(s): PROBNP in the last 8760 hours. TSH No results for input(s): TSH in the last 8760 hours. CHOLESTEROL No results for input(s): CHOL in the last 8760 hours.  Scheduled Meds: . acidophilus  1 capsule Oral QHS  . amiodarone  200 mg Oral QHS  . atorvastatin  20 mg Oral q1800  . cefTRIAXone (ROCEPHIN)  IV  2 g Intravenous Q24H  . collagenase   Topical Daily  . feeding supplement  1 Container Oral BID BM  . heparin subcutaneous  5,000 Units Subcutaneous 3 times per day  . insulin aspart  0-15 Units Subcutaneous TID WC  . insulin aspart  0-5 Units Subcutaneous QHS  . latanoprost  1 drop Both Eyes QHS  . metoprolol tartrate  25 mg Oral TID  . multivitamin with minerals  1 tablet Oral Daily  . sodium chloride  3  mL Intravenous Q12H  . tamsulosin  0.4 mg Oral Daily   Continuous Infusions:  PRN Meds:.acetaminophen **OR** acetaminophen, HYDROcodone-acetaminophen, ondansetron **OR** ondansetron (ZOFRAN) IV, senna-docusate  Assessment/Plan: Cellulitis right leg associated with multiple ruptured blisters and small open wound Resolving hematoma status post fall Coronary artery disease  History of PCI to LAD in remote past Hypertension Chronic atrial fibrillation with moderate ventricular response Diabetes mellitus, II Obstructive uropathy rule out benign hypertrophy of prostate Acute on Chronic kidney disease stage III multifactorial  Anemia of chronic disease Hyperlipidemia Morbid obesity Obstructive sleep apnea Degenerative joint disease Constipation. Marked leukocytosis Probable UTI. Sacral decubitus ulcers, stage II  Continue IV antibiotics.     LOS: 7 days    Dixie Dials  MD  05/17/2015, 10:42 AM

## 2015-05-18 DIAGNOSIS — E559 Vitamin D deficiency, unspecified: Secondary | ICD-10-CM | POA: Diagnosis not present

## 2015-05-18 DIAGNOSIS — I509 Heart failure, unspecified: Secondary | ICD-10-CM | POA: Diagnosis not present

## 2015-05-18 DIAGNOSIS — S80221D Blister (nonthermal), right knee, subsequent encounter: Secondary | ICD-10-CM | POA: Diagnosis not present

## 2015-05-18 DIAGNOSIS — D649 Anemia, unspecified: Secondary | ICD-10-CM | POA: Diagnosis not present

## 2015-05-18 DIAGNOSIS — N400 Enlarged prostate without lower urinary tract symptoms: Secondary | ICD-10-CM | POA: Diagnosis not present

## 2015-05-18 DIAGNOSIS — N134 Hydroureter: Secondary | ICD-10-CM | POA: Diagnosis not present

## 2015-05-18 DIAGNOSIS — D638 Anemia in other chronic diseases classified elsewhere: Secondary | ICD-10-CM | POA: Diagnosis not present

## 2015-05-18 DIAGNOSIS — L89621 Pressure ulcer of left heel, stage 1: Secondary | ICD-10-CM | POA: Diagnosis not present

## 2015-05-18 DIAGNOSIS — R310 Gross hematuria: Secondary | ICD-10-CM | POA: Diagnosis not present

## 2015-05-18 DIAGNOSIS — E1322 Other specified diabetes mellitus with diabetic chronic kidney disease: Secondary | ICD-10-CM | POA: Diagnosis not present

## 2015-05-18 DIAGNOSIS — I70239 Atherosclerosis of native arteries of right leg with ulceration of unspecified site: Secondary | ICD-10-CM | POA: Diagnosis not present

## 2015-05-18 DIAGNOSIS — I2510 Atherosclerotic heart disease of native coronary artery without angina pectoris: Secondary | ICD-10-CM | POA: Diagnosis not present

## 2015-05-18 DIAGNOSIS — R2681 Unsteadiness on feet: Secondary | ICD-10-CM | POA: Diagnosis not present

## 2015-05-18 DIAGNOSIS — N1330 Unspecified hydronephrosis: Secondary | ICD-10-CM | POA: Diagnosis not present

## 2015-05-18 DIAGNOSIS — N189 Chronic kidney disease, unspecified: Secondary | ICD-10-CM | POA: Diagnosis not present

## 2015-05-18 DIAGNOSIS — I482 Chronic atrial fibrillation: Secondary | ICD-10-CM | POA: Diagnosis not present

## 2015-05-18 DIAGNOSIS — M6281 Muscle weakness (generalized): Secondary | ICD-10-CM | POA: Diagnosis not present

## 2015-05-18 DIAGNOSIS — I10 Essential (primary) hypertension: Secondary | ICD-10-CM | POA: Diagnosis not present

## 2015-05-18 DIAGNOSIS — N184 Chronic kidney disease, stage 4 (severe): Secondary | ICD-10-CM | POA: Diagnosis not present

## 2015-05-18 DIAGNOSIS — E876 Hypokalemia: Secondary | ICD-10-CM | POA: Diagnosis not present

## 2015-05-18 DIAGNOSIS — Z9181 History of falling: Secondary | ICD-10-CM | POA: Diagnosis not present

## 2015-05-18 DIAGNOSIS — I739 Peripheral vascular disease, unspecified: Secondary | ICD-10-CM | POA: Diagnosis not present

## 2015-05-18 DIAGNOSIS — N179 Acute kidney failure, unspecified: Secondary | ICD-10-CM | POA: Diagnosis not present

## 2015-05-18 DIAGNOSIS — R300 Dysuria: Secondary | ICD-10-CM | POA: Diagnosis not present

## 2015-05-18 DIAGNOSIS — I5032 Chronic diastolic (congestive) heart failure: Secondary | ICD-10-CM | POA: Diagnosis not present

## 2015-05-18 DIAGNOSIS — I129 Hypertensive chronic kidney disease with stage 1 through stage 4 chronic kidney disease, or unspecified chronic kidney disease: Secondary | ICD-10-CM | POA: Diagnosis not present

## 2015-05-18 DIAGNOSIS — S81802D Unspecified open wound, left lower leg, subsequent encounter: Secondary | ICD-10-CM | POA: Diagnosis not present

## 2015-05-18 DIAGNOSIS — L988 Other specified disorders of the skin and subcutaneous tissue: Secondary | ICD-10-CM | POA: Diagnosis not present

## 2015-05-18 DIAGNOSIS — L89623 Pressure ulcer of left heel, stage 3: Secondary | ICD-10-CM | POA: Diagnosis not present

## 2015-05-18 DIAGNOSIS — L89323 Pressure ulcer of left buttock, stage 3: Secondary | ICD-10-CM | POA: Diagnosis not present

## 2015-05-18 DIAGNOSIS — Z0000 Encounter for general adult medical examination without abnormal findings: Secondary | ICD-10-CM | POA: Diagnosis not present

## 2015-05-18 DIAGNOSIS — E785 Hyperlipidemia, unspecified: Secondary | ICD-10-CM | POA: Diagnosis not present

## 2015-05-18 DIAGNOSIS — E669 Obesity, unspecified: Secondary | ICD-10-CM | POA: Diagnosis not present

## 2015-05-18 DIAGNOSIS — S81802A Unspecified open wound, left lower leg, initial encounter: Secondary | ICD-10-CM | POA: Diagnosis not present

## 2015-05-18 DIAGNOSIS — I110 Hypertensive heart disease with heart failure: Secondary | ICD-10-CM | POA: Diagnosis not present

## 2015-05-18 DIAGNOSIS — E1122 Type 2 diabetes mellitus with diabetic chronic kidney disease: Secondary | ICD-10-CM | POA: Diagnosis not present

## 2015-05-18 DIAGNOSIS — N183 Chronic kidney disease, stage 3 (moderate): Secondary | ICD-10-CM | POA: Diagnosis not present

## 2015-05-18 DIAGNOSIS — I70249 Atherosclerosis of native arteries of left leg with ulceration of unspecified site: Secondary | ICD-10-CM | POA: Diagnosis not present

## 2015-05-18 DIAGNOSIS — L0390 Cellulitis, unspecified: Secondary | ICD-10-CM | POA: Diagnosis not present

## 2015-05-18 DIAGNOSIS — S81801D Unspecified open wound, right lower leg, subsequent encounter: Secondary | ICD-10-CM | POA: Diagnosis not present

## 2015-05-18 DIAGNOSIS — R238 Other skin changes: Secondary | ICD-10-CM | POA: Diagnosis not present

## 2015-05-18 DIAGNOSIS — E119 Type 2 diabetes mellitus without complications: Secondary | ICD-10-CM | POA: Diagnosis not present

## 2015-05-18 DIAGNOSIS — G4733 Obstructive sleep apnea (adult) (pediatric): Secondary | ICD-10-CM | POA: Diagnosis not present

## 2015-05-18 DIAGNOSIS — L03115 Cellulitis of right lower limb: Secondary | ICD-10-CM | POA: Diagnosis not present

## 2015-05-18 LAB — CBC
HCT: 37.2 % — ABNORMAL LOW (ref 39.0–52.0)
Hemoglobin: 11.9 g/dL — ABNORMAL LOW (ref 13.0–17.0)
MCH: 28.4 pg (ref 26.0–34.0)
MCHC: 32 g/dL (ref 30.0–36.0)
MCV: 88.8 fL (ref 78.0–100.0)
PLATELETS: 454 10*3/uL — AB (ref 150–400)
RBC: 4.19 MIL/uL — ABNORMAL LOW (ref 4.22–5.81)
RDW: 16.1 % — AB (ref 11.5–15.5)
WBC: 14.6 10*3/uL — ABNORMAL HIGH (ref 4.0–10.5)

## 2015-05-18 LAB — BASIC METABOLIC PANEL
Anion gap: 12 (ref 5–15)
BUN: 53 mg/dL — AB (ref 6–20)
CO2: 25 mmol/L (ref 22–32)
Calcium: 8.9 mg/dL (ref 8.9–10.3)
Chloride: 107 mmol/L (ref 101–111)
Creatinine, Ser: 1.99 mg/dL — ABNORMAL HIGH (ref 0.61–1.24)
GFR calc Af Amer: 36 mL/min — ABNORMAL LOW (ref >60)
GFR, EST NON AFRICAN AMERICAN: 31 mL/min — AB (ref >60)
GLUCOSE: 170 mg/dL — AB (ref 65–99)
POTASSIUM: 4.3 mmol/L (ref 3.5–5.1)
Sodium: 144 mmol/L (ref 135–145)

## 2015-05-18 LAB — GLUCOSE, CAPILLARY
GLUCOSE-CAPILLARY: 147 mg/dL — AB (ref 65–99)
Glucose-Capillary: 150 mg/dL — ABNORMAL HIGH (ref 65–99)
Glucose-Capillary: 142 mg/dL — ABNORMAL HIGH (ref 65–99)

## 2015-05-18 MED ORDER — METOPROLOL TARTRATE 25 MG PO TABS: 25.0000 mg | ORAL_TABLET | Freq: Two times a day (BID) | ORAL | Status: DC

## 2015-05-18 MED ORDER — TAMSULOSIN HCL 0.4 MG PO CAPS: 0.4000 mg | ORAL_CAPSULE | Freq: Every day | ORAL | Status: DC

## 2015-05-18 MED ORDER — CEPHALEXIN 500 MG PO CAPS: 500.0000 mg | ORAL_CAPSULE | Freq: Two times a day (BID) | ORAL | Status: DC

## 2015-05-18 MED ORDER — RIVAROXABAN 15 MG PO TABS: 15.0000 mg | ORAL_TABLET | Freq: Every day | ORAL | Status: DC

## 2015-05-18 MED ORDER — COLLAGENASE 250 UNIT/GM EX OINT: TOPICAL_OINTMENT | Freq: Every day | CUTANEOUS | Status: DC

## 2015-05-18 NOTE — Progress Notes (Signed)
Occupational Therapy Treatment Patient Details Name: Reginald [name] MRN: [id number] DOB: [birth year] Today's Date: 05/18/2015    History of present illness Pt admitted s/p fall with progressive swelling and blistering lower legs.  He is a 79 y.o. male with Past medical history of atrial fibrillation on chronic anticoagulation, obstructive sleep apnea on C Pap, essential hypertension, chronic kidney disease, type 2 diabetes mellitus, chronic venous stasis.   OT comments  Pt making slow progress towards goals.  Engaged in bed mobility with focus on technique for rolling to assist with bed mobility and toileting with bed pan.  Pt required mod-max assist rolling Rt and Lt.  Pt began initiating supine > sit, therefore educated on increased positioning and technique with pt able to come up to sitting with Max assist of 1 caregiver.  Upon sitting EOB, utilized foot rail to steady self in unsupported sitting.  Drank water while seated at EOB with min guard for sitting balance due to pt constantly weight shifting and scooting to EOB.  Returned to supine in bed with pt requiring assist to position RLE.  Trendelenburg with pt using UE to pull up in bed.  Discussed current goals and progress from +2 assist to 1 this session.  D/C plan remains appropriate.  Follow Up Recommendations  SNF    Equipment Recommendations  None recommended by OT    Recommendations for Other Services      Precautions / Restrictions Precautions Precautions: Fall Precaution Comments: prior fall at home while reaching for item off lower surface Restrictions Weight Bearing Restrictions: No       Mobility Bed Mobility Overal bed mobility: Needs Assistance Bed Mobility: Rolling Rolling: Max assist   Supine to sit: Max assist;HOB elevated (bed rails) Sit to supine: Max assist;HOB elevated (bed rails)   General bed mobility comments: Assist with RLE and to elevate trunk to get to EOB. Educated on rolling to side to assist  and use of Rt elbow to assist with sidelying to sitting to increase participation in mobility.  Able to reach across body to use rail to pull up while using Rt elbow for positioning. Trendelenburg position to scoot to [facility] using UEs.           ADL Overall ADL's : Needs assistance/impaired   Eating/Feeding Details (indicate cue type and reason): drank water seated at EOB with min guard for sitting balance                                   General ADL Comments: Pt with SOB in session-took breaks.                Cognition   Behavior During Therapy: WFL for tasks assessed/performed Overall Cognitive Status: No family/caregiver present to determine baseline cognitive functioning                                    Pertinent Vitals/ Pain       Pain Assessment: Faces Faces Pain Scale: Hurts even more Pain Location: RLE with movement/touch Pain Descriptors / Indicators: Constant;Aching;Sore Pain Intervention(s): Monitored during session;Repositioned;Limited activity within patient's tolerance         Frequency Min 2X/week     Progress Toward Goals  OT Goals(current goals can now be found in the care plan section)  Progress towards OT goals: Progressing toward goals  Acute Rehab OT Goals Patient Stated Goal: to go to rehab OT Goal Formulation: With patient Time For Goal Achievement: 05/24/15 Potential to Achieve Goals: Good  Plan Discharge plan remains appropriate       End of Session     Activity Tolerance Patient tolerated treatment well   Patient Left in bed;with call bell/phone within reach   Nurse Communication Mobility status        Time: [id number] OT Time Calculation (min): 20 min  Charges: OT General Charges $OT Visit: 1 Procedure OT Treatments $Self Care/Home Management : 8-22 mins  Simonne Come, [id number] 05/18/2015, 3:18 PM

## 2015-05-18 NOTE — Clinical Documentation Improvement (Signed)
Cardiology  Please clarify if the following diagnosis, UTI was:   Present at the time of admission (POA)  NOT present at the time of admission and it developed during the inpatient stay  Unable to clinically determine whether the condition was present on admission.  Unknown   Supporting Information:  11/3 progress note: Rule out UTI  11/4, 5, & 6 progress notes: probable UTI  Component     Latest Ref Rng 05/13/2015  Color, Urine     YELLOW YELLOW  Appearance     CLEAR TURBID (A)  Specific Gravity, Urine     1.005 - 1.030 1.010  pH     5.0 - 8.0 5.5  Glucose     NEGATIVE mg/dL NEGATIVE  Hgb urine dipstick     NEGATIVE LARGE (A)  Bilirubin Urine     NEGATIVE NEGATIVE  Ketones, ur     NEGATIVE mg/dL NEGATIVE  Protein     NEGATIVE mg/dL 30 (A)  Urobilinogen, UA     0.0 - 1.0 mg/dL 1.0  Nitrite     NEGATIVE NEGATIVE  Leukocytes, UA     NEGATIVE SMALL (A)  WBC, UA     <3 WBC/hpf 11-20  RBC / HPF     <3 RBC/hpf TOO NUMEROUS TO COUNT  Bacteria, UA     RARE FEW (A)  Squamous Epithelial / LPF     RARE MANY (A)   Component     Latest Ref Rng 05/13/2015          Specimen Description      URINE, CATHETERIZED  Special Requests      NONE  Culture      NO GROWTH 2 DAYS  Report Status      05/16/2015 FINAL    Please exercise your independent, professional judgment when responding. A specific answer is not anticipated or expected.   Thank You,  Colma [phone]

## 2015-05-18 NOTE — Discharge Instructions (Signed)
Acute Kidney Injury Acute kidney injury is any condition in which there is sudden (acute) damage to the kidneys. Acute kidney injury was previously known as acute kidney failure or acute renal failure. The kidneys are two organs that lie on either side of the spine between the middle of the back and the front of the abdomen. The kidneys:  Remove wastes and extra water from the blood.   Produce important hormones. These help keep bones strong, regulate blood pressure, and help create red blood cells.   Balance the fluids and chemicals in the blood and tissues. A small amount of kidney damage may not cause problems, but a large amount of damage may make it difficult or impossible for the kidneys to work the way they should. Acute kidney injury may develop into long-lasting (chronic) kidney disease. It may also develop into a life-threatening disease called end-stage kidney disease. Acute kidney injury can get worse very quickly, so it should be treated right away. Early treatment may prevent other kidney diseases from developing. CAUSES   A problem with blood flow to the kidneys. This may be caused by:   Blood loss.   Heart disease.   Severe burns.   Liver disease.  Direct damage to the kidneys. This may be caused by:  Some medicines.   A kidney infection.   Poisoning or consuming toxic substances.   A surgical wound.   A blow to the kidney area.   A problem with urine flow. This may be caused by:   Cancer.   Kidney stones.   An enlarged prostate. SIGNS AND SYMPTOMS   Swelling (edema) of the legs, ankles, or feet.   Tiredness (lethargy).   Nausea or vomiting.   Confusion.   Problems with urination, such as:   Painful or burning feeling during urination.   Decreased urine production.   Frequent accidents in children who are potty trained.   Bloody urine.   Muscle twitches and cramps.   Shortness of breath.   Seizures.   Chest  pain or pressure. Sometimes, no symptoms are present. DIAGNOSIS Acute kidney injury may be detected and diagnosed by tests, including blood, urine, imaging, or kidney biopsy tests.  TREATMENT Treatment of acute kidney injury varies depending on the cause and severity of the kidney damage. In mild cases, no treatment may be needed. The kidneys may heal on their own. If acute kidney injury is more severe, your health care provider will treat the cause of the kidney damage, help the kidneys heal, and prevent complications from occurring. Severe cases may require a procedure to remove toxic wastes from the body (dialysis) or surgery to repair kidney damage. Surgery may involve:   Repair of a torn kidney.   Removal of an obstruction. HOME CARE INSTRUCTIONS  Follow your prescribed diet.  Take medicines only as directed by your health care provider.  Do not take any new medicines (prescription, over-the-counter, or nutritional supplements) unless approved by your health care provider. Many medicines can worsen your kidney damage or may need to have the dose adjusted.   Keep all follow-up visits as directed by your health care provider. This is important.  Observe your condition to make sure you are healing as expected. SEEK IMMEDIATE MEDICAL CARE IF:  You are feeling ill or have severe pain in the back or side.   Your symptoms return or you have new symptoms.  You have any symptoms of end-stage kidney disease. These include:   Persistent itchiness.  Loss of appetite.   Headaches.   Abnormally dark or light skin.  Numbness in the hands or feet.   Easy bruising.   Frequent hiccups.   Menstruation stops.   You have a fever.  You have increased urine production.  You have pain or bleeding when urinating. MAKE SURE YOU:   Understand these instructions.  Will watch your condition.  Will get help right away if you are not doing well or get worse.   This  information is not intended to replace advice given to you by your health care provider. Make sure you discuss any questions you have with your health care provider.   Document Released: 01/10/2011 Document Revised: 07/18/2014 Document Reviewed: 02/24/2012 Elsevier Interactive Patient Education 2016 Elsevier Inc. Cellulitis Cellulitis is an infection of the skin and the tissue beneath it. The infected area is usually red and tender. Cellulitis occurs most often in the arms and lower legs.  CAUSES  Cellulitis is caused by bacteria that enter the skin through cracks or cuts in the skin. The most common types of bacteria that cause cellulitis are staphylococci and streptococci. SIGNS AND SYMPTOMS   Redness and warmth.  Swelling.  Tenderness or pain.  Fever. DIAGNOSIS  Your health care provider can usually determine what is wrong based on a physical exam. Blood tests may also be done. TREATMENT  Treatment usually involves taking an antibiotic medicine. HOME CARE INSTRUCTIONS   Take your antibiotic medicine as directed by your health care provider. Finish the antibiotic even if you start to feel better.  Keep the infected arm or leg elevated to reduce swelling.  Apply a warm cloth to the affected area up to 4 times per day to relieve pain.  Take medicines only as directed by your health care provider.  Keep all follow-up visits as directed by your health care provider. SEEK MEDICAL CARE IF:   You notice red streaks coming from the infected area.  Your red area gets larger or turns dark in color.  Your bone or joint underneath the infected area becomes painful after the skin has healed.  Your infection returns in the same area or another area.  You notice a swollen bump in the infected area.  You develop new symptoms.  You have a fever. SEEK IMMEDIATE MEDICAL CARE IF:   You feel very sleepy.  You develop vomiting or diarrhea.  You have a general ill feeling (malaise)  with muscle aches and pains.   This information is not intended to replace advice given to you by your health care provider. Make sure you discuss any questions you have with your health care provider.   Document Released: 04/06/2005 Document Revised: 03/18/2015 Document Reviewed: 09/12/2011 Elsevier Interactive Patient Education [employer].

## 2015-05-18 NOTE — Progress Notes (Signed)
Patient will discharge to [facility] Anticipated discharge date: 05/18/15 Family notified: pt to notify Transportation by PTAR- called at 3:45pm  Time signing off.  Domenica Reamer, [place] Social Worker [phone]

## 2015-05-18 NOTE — Progress Notes (Addendum)
ANTICOAGULATION CONSULT NOTE - Initial Consult  Pharmacy Consult for Xarelto Indication: atrial fibrillation  No Known Allergies  Patient Measurements: Height: 5\' 10"  (177.8 cm) Weight: (!) 306 lb 10.6 oz (139.1 kg) IBW/kg (Calculated) : 73  Vital Signs: Temp: 98.2 F (36.8 C) (11/07 0555) Temp Source: Oral (11/07 0555) BP: 137/75 mmHg (11/07 0555) Pulse Rate: 88 (11/07 0555)  Labs:  Recent Labs  05/16/15 0438 05/18/15 0844  HGB 11.3* 11.9*  HCT 34.6* 37.2*  PLT 358 454*  CREATININE 2.37* 1.99*    Estimated Creatinine Clearance: 43.7 mL/min (by C-G formula based on Cr of 1.99).   Medical History: Past Medical History  Diagnosis Date  . Type II or unspecified type diabetes mellitus without mention of complication, not stated as uncontrolled   . Obesity, unspecified   . Unspecified essential hypertension   . Coronary atherosclerosis of unspecified type of vessel, native or graft   . Vestibulopathy   . Hypokalemia   . Atrial fibrillation ([place])   . Rhabdomyolysis   . Hypercholesteremia   . COPD (chronic obstructive pulmonary disease) ([place])   . OSA (obstructive sleep apnea)   . Anteroseptal myocardial infarction ([place])   . Obesity hypoventilation syndrome ([place])   . Hypertensive renal disease   . Chronic kidney disease   . CHF (congestive heart failure) ([place])   . Diverticulosis   . Glaucoma     Medications:  Prescriptions prior to admission  Medication Sig Dispense Refill Last Dose  . amiodarone (PACERONE) 200 MG tablet Take 200 mg by mouth at bedtime.    05/06/2015 at Unknown time  . atorvastatin (LIPITOR) 20 MG tablet Take 20 mg by mouth daily at 6 PM.    05/06/2015 at Unknown time  . cholecalciferol (VITAMIN D) 1000 UNITS tablet Take 1,000 Units by mouth daily.   05/07/2015 at Unknown time  . furosemide (LASIX) 40 MG tablet Take 40 mg by mouth at bedtime.    05/06/2015 at Unknown time  . HYDROcodone-acetaminophen (NORCO) 5-325 MG tablet Take 1-2 tablets by  mouth every 6 (six) hours as needed for moderate pain or severe pain. 30 tablet 0 05/07/2015 at 1800  . Lactobacillus (ACIDOPHILUS PROBIOTIC) 10 MG TABS Take 10 mg by mouth 3 (three) times daily. (Patient taking differently: Take 10 mg by mouth at bedtime. ) 90 tablet 0 05/06/2015 at Unknown time  . latanoprost (XALATAN) 0.005 % ophthalmic solution Place 1 drop into both eyes at bedtime.   05/06/2015 at Unknown time  . lisinopril (PRINIVIL,ZESTRIL) 40 MG tablet Take 40 mg by mouth 2 (two) times daily.   05/07/2015 at Unknown time  . nitroGLYCERIN (NITROSTAT) 0.4 MG SL tablet Place 0.4 mg under the tongue every 5 (five) minutes as needed for chest pain.   unknown  . potassium chloride SA (K-DUR,KLOR-CON) 20 MEQ tablet Take 20 mEq by mouth 3 (three) times daily.     05/07/2015 at Unknown time  . saxagliptin HCl (ONGLYZA) 5 MG TABS tablet Take 5 mg by mouth daily.   05/07/2015 at Unknown time  . XARELTO 15 MG TABS tablet Take 15 mg by mouth daily with supper.    05/05/2015  . clindamycin (CLEOCIN) 150 MG capsule Take 3 capsules (450 mg total) by mouth 3 (three) times daily. May dispense as 150mg  capsules (Patient not taking: Reported on 05/07/2015) 90 capsule 0 Completed Course at Unknown time    Assessment: 79 y/o obese male who was on Xarelto PTA for atrial fibrillation which has been on hold since  admit 10/27. Pharmacy consulted to resume. He has a history CKD and SCr is 1.99, est CrCl ~43 ml/min. Also had a fall and now has a resolving hematoma. No bleeding noted, CBC is stable.  Goal of Therapy:  therapeutic anticoagulation Monitor platelets by anticoagulation protocol: Yes   Plan:  - Discontinue SQ heparin - Resume Xarelto 15 mg PO daily with supper - Monitor for s/sx of bleeding - Monitor renal function  [facility], Pharm.D., BCPS Clinical Pharmacist Pager: [phone] 05/18/2015 12:51 PM

## 2015-05-18 NOTE — Care Management Note (Signed)
Case Management Note  Patient Details  Name: Reginald [name] MRN: [id number] Date of Birth: June 09, 1936  Subjective/Objective:                 Patient to discharge to SNF today, as facilitated through [place].   Action/Plan:   Expected Discharge Date:                  Expected Discharge Plan:  [place]  In-House Referral:     Discharge planning Services  CM Consult  Post Acute Care Choice:    Choice offered to:     DME Arranged:    DME Agency:     HH Arranged:    [place] Agency:     Status of Service:  Completed, signed off  Medicare Important Message Given:  Yes-third notification given Date Medicare IM Given:    Medicare IM give by:    Date Additional Medicare IM Given:    Additional Medicare Important Message give by:     If discussed at [place] of Stay Meetings, dates discussed:    Additional Comments:  Carles Collet, RN 05/18/2015, 2:29 PM

## 2015-05-18 NOTE — Progress Notes (Signed)
NURSING PROGRESS NOTE  KAJON EDERER [id number] Discharge Data: 05/18/2015 5:01 PM Attending Provider: No att. providers found [id number], Prudencio Burly, MD     [name] to be D/C'd Skilled nursing facility per MD order.  Discussed with the patient the After Visit Summary and all questions fully answered. All IV's discontinued with no bleeding noted. All belongings returned to patient for patient to take to [facility]. Report given to Karenann Cai. All questions answered.   Last Vital Signs:  Blood pressure 131/62, pulse 96, temperature 97.4 F (36.3 C), temperature source Oral, resp. rate 20, height 5\' 10"  (1.778 m), weight 139.1 kg (306 lb 10.6 oz), SpO2 95 %.  Discharge Medication List   Medication List    STOP taking these medications        clindamycin 150 MG capsule  Commonly known as:  CLEOCIN     furosemide 40 MG tablet  Commonly known as:  LASIX     lisinopril 40 MG tablet  Commonly known as:  PRINIVIL,ZESTRIL     potassium chloride SA 20 MEQ tablet  Commonly known as:  K-DUR,KLOR-CON      TAKE these medications        ACIDOPHILUS PROBIOTIC 10 MG Tabs  Take 10 mg by mouth 3 (three) times daily.     amiodarone 200 MG tablet  Commonly known as:  PACERONE  Take 200 mg by mouth at bedtime.     atorvastatin 20 MG tablet  Commonly known as:  LIPITOR  Take 20 mg by mouth daily at 6 PM.     cephALEXin 500 MG capsule  Commonly known as:  KEFLEX  Take 1 capsule (500 mg total) by mouth 2 (two) times daily.     cholecalciferol 1000 UNITS tablet  Commonly known as:  VITAMIN D  Take 1,000 Units by mouth daily.     collagenase ointment  Commonly known as:  SANTYL  Apply topically daily.     HYDROcodone-acetaminophen 5-325 MG tablet  Commonly known as:  NORCO  Take 1-2 tablets by mouth every 6 (six) hours as needed for moderate pain or severe pain.     latanoprost 0.005 % ophthalmic solution  Commonly known as:  XALATAN  Place 1 drop into both eyes at  bedtime.     metoprolol tartrate 25 MG tablet  Commonly known as:  LOPRESSOR  Take 1 tablet (25 mg total) by mouth 2 (two) times daily.     nitroGLYCERIN 0.4 MG SL tablet  Commonly known as:  NITROSTAT  Place 0.4 mg under the tongue every 5 (five) minutes as needed for chest pain.     saxagliptin HCl 5 MG Tabs tablet  Commonly known as:  ONGLYZA  Take 5 mg by mouth daily.     tamsulosin 0.4 MG Caps capsule  Commonly known as:  FLOMAX  Take 1 capsule (0.4 mg total) by mouth daily.     XARELTO 15 MG Tabs tablet  Generic drug:  Rivaroxaban  Take 15 mg by mouth daily with supper.         Charolette Child, RN

## 2015-05-18 NOTE — Discharge Summary (Signed)
Discharge summary dictated on 05/18/2015, dictation number is [phone]

## 2015-05-18 NOTE — Discharge Summary (Signed)
Reginald [name], Reginald [name] NO.:  [id number]  MEDICAL RECORD NO.:  [id number]  LOCATION:  5W06C                        FACILITY:  [facility]  PHYSICIAN:  Allegra Lai. Terrence Dupont, M.D. DATE OF BIRTH:  [birth year]  DATE OF ADMISSION:  05/07/2015 DATE OF DISCHARGE:  05/18/2015                              DISCHARGE SUMMARY   Date of admission by triad hospitalist on May 08, 2015.  Date of discharge is May 18, 2015 to skilled nursing facility  ADMITTING DIAGNOSES: 1. Cellulitis  of right leg associated with multiple blisters and     small open wound number. 2. Coronary artery disease, history of PCI to LAD in the remote past. 3. Hypertension. 4. Chronic atrial fibrillation with moderate ventricular response. 5. Hypertension. 6. Chronic kidney disease, stage 3. 7. Anemia of chronic disease. 8. Hyperlipidemia. 9. Morbid obesity. 10.Obstructive sleep apnea. 11.Degenerative joint disease.  DISCHARGE DIAGNOSES: 1. Resolving cellulitis of right leg with multiple ruptured blisters     which are healing. 2. Coronary artery disease, history of PCI to LAD in the remote past. 3. Hypertension. 4. Chronic atrial fibrillation with controlled ventricular response. 5. Diabetes mellitus. 6. Resolving acute on chronic kidney disease secondary to obstructive     uropathy. 7. Anemia of chronic disease. 8. Hyperlipidemia. 9. Morbid obesity. 10.Obstructive sleep apnea. 11.Degenerative joint disease. 12.Possible urinary tract infection, status post possible UTI. 13.Possible benign hypertrophy of prostate.  HOME MEDICATIONS: 1. Cephalexin 500 mg 1 capsule twice daily for 1 week. 2. Santyl ointment apply locally daily. 3. Lopressor 25 mg twice daily. 4. Flomax 0.4 mg 1 capsule daily. 5. Amiodarone 200 mg 1 tablet daily. 6. Atorvastatin 20 mg daily. 7. Vitamin D 1 tablet daily. 8. Norco 1-2 tablets every 6 hours as needed for moderate-to-severe     pain. 9. Xalatan eye drops as  before. 10.Nitrostat 0.4 mg sublingual use as directed. 11.Onglyza 5 mg daily as before. 12.Xarelto 15 mg 1 tablet daily. 13.Probiotic 10 mg 3 times daily as before.  The patient has been advised to stop clindamycin, furosemide lisinopril and potassium chloride for now.  DIET:  Low salt low cholesterol 1800 calories weight reducing diet. Monitor blood pressure and blood sugar daily and chart.  FOLLOWUP:  Follow up with me in 2 weeks.  CONDITION AT DISCHARGE:  Stable.  BRIEF HISTORY AND HOSPITAL COURSE:  Mr. Jalali is a 79 year old male with past medical history significant for multiple medical problems, i.e., coronary artery disease, history of PTCA stenting to LAD in remote past, hypertension, diabetes mellitus, chronic kidney disease stage 3, chronic atrial fibrillation, anemia of chronic disease, hyperlipidemia, morbid obesity, obstructive sleep apnea, degenerative joint disease, was admitted by triad hospitalist because of fall which he sustained on October 26, was seen in the ED and was found to have soft tissue swelling with hematoma.  He had appointment as outpatient at [facility], but as the symptoms got progressively worse involving swelling of the thigh with large area of ecchymosis and multiple large blisters, so decided to come to ED as he has difficulty ambulating and taking care of himself.  PHYSICAL EXAMINATION:  GENERAL:  He was alert, awake, oriented x3 and hemodynamically stable. NECK:  No JVD. CARDIOVASCULAR:  S1, S2 was soft.  There was no S3 gallop. LUNGS:  Clear to auscultation. ABDOMEN:  Soft, obese, nontender. SKIN:  Lower extremities, multiple various size blisters filled with blood noted around the knee joint on the right leg.  Extensive ecchymoses also noted around the knee joint radiating up to the inner thigh and calf region and poorly healing crest ulcer in the left knee, open wound on the right shin also was noted.  Bilateral pedal edema  was noted.  There was no calf tenderness.  ADMISSION LABS:  Sodium 136, potassium 3.8, BUN 21, creatinine 1.56. Hemoglobin was 12.8, hematocrit 39.7, white count of 11.7, and repeat electrolytes BUN was 22, creatinine 1.45.  On November 1, BUN was 52, creatinine 2.58.  Repeat electrolytes on November 2, BUN 86, creatinine 4.30.  On November 3, BUN 95, creatinine trending down 3.92.  On November 5, BUN 67, creatinine 2.37, and today BUN is 53, creatinine is trending down 1.99.  Hemoglobin today is 11.9, hematocrit 37.2, white count is also trending down to 14.6.  Renal ultrasound showed bilateral hydronephrosis.  CT of the brain done on November 3 showed atrophy and chronic microvascular ischemia and no acute abnormality was noted.  BRIEF HOSPITAL COURSE:  The patient was admitted to telemetry unit by triad hospitalist.  The patient's pan cultures were obtained and was started on IV vancomycin and Surgical consultation was obtained for multiple blisters, was felt to be not a surgical candidate and recommendation was to treat medically.  The patient went into acute renal failure which was felt initially due to vancomycin, ACE inhibitors, and diuretics, but subsequently, noted to have significant residual, requiring indwelling Foley catheter with improvement in the renal function.  Renal ultrasound was obtained which showed mild bilateral hydronephrosis.  The patient's vancomycin was switched to IV Rocephin as the patient was noted to have significantly cloudy urine and was noted to have 11-20 WBCs and was turbid, although urine culture done next day was negative.  The patient remained afebrile during the hospital stay.  His white count is trending down.  OT/PT consultation was called.  The patient requires 2 people to just get him out of the bed.  His blisters are healing slowly.  His right leg hematoma has bean improving gradually.  The patient has been restarted on Xarelto today. The  patient will be followed up in my office in 2 weeks.  The patient will be discharged to skilled nursing facility for rehab.  If the patient has recurrent problem with voiding, may need Urology referral for evaluation for prostate.     Allegra Lai. Terrence Dupont, M.D.     MNH/MEDQ  D:  05/18/2015  T:  05/18/2015  Job:  [id number]

## 2015-05-19 ENCOUNTER — Non-Acute Institutional Stay (SKILLED_NURSING_FACILITY): Payer: Medicare Other | Admitting: Internal Medicine

## 2015-05-19 ENCOUNTER — Encounter: Payer: Self-pay | Admitting: Internal Medicine

## 2015-05-19 DIAGNOSIS — I110 Hypertensive heart disease with heart failure: Secondary | ICD-10-CM | POA: Insufficient documentation

## 2015-05-19 DIAGNOSIS — E1122 Type 2 diabetes mellitus with diabetic chronic kidney disease: Secondary | ICD-10-CM | POA: Diagnosis not present

## 2015-05-19 DIAGNOSIS — N183 Chronic kidney disease, stage 3 (moderate): Secondary | ICD-10-CM

## 2015-05-19 DIAGNOSIS — L988 Other specified disorders of the skin and subcutaneous tissue: Secondary | ICD-10-CM | POA: Diagnosis not present

## 2015-05-19 DIAGNOSIS — E785 Hyperlipidemia, unspecified: Secondary | ICD-10-CM

## 2015-05-19 DIAGNOSIS — I5032 Chronic diastolic (congestive) heart failure: Secondary | ICD-10-CM | POA: Diagnosis not present

## 2015-05-19 DIAGNOSIS — I482 Chronic atrial fibrillation, unspecified: Secondary | ICD-10-CM

## 2015-05-19 DIAGNOSIS — E559 Vitamin D deficiency, unspecified: Secondary | ICD-10-CM | POA: Diagnosis not present

## 2015-05-19 DIAGNOSIS — R238 Other skin changes: Secondary | ICD-10-CM | POA: Diagnosis not present

## 2015-05-19 DIAGNOSIS — G4733 Obstructive sleep apnea (adult) (pediatric): Secondary | ICD-10-CM

## 2015-05-19 DIAGNOSIS — E1322 Other specified diabetes mellitus with diabetic chronic kidney disease: Secondary | ICD-10-CM

## 2015-05-19 DIAGNOSIS — N184 Chronic kidney disease, stage 4 (severe): Secondary | ICD-10-CM

## 2015-05-19 DIAGNOSIS — E1169 Type 2 diabetes mellitus with other specified complication: Secondary | ICD-10-CM | POA: Insufficient documentation

## 2015-05-19 DIAGNOSIS — L03115 Cellulitis of right lower limb: Secondary | ICD-10-CM

## 2015-05-19 DIAGNOSIS — I129 Hypertensive chronic kidney disease with stage 1 through stage 4 chronic kidney disease, or unspecified chronic kidney disease: Secondary | ICD-10-CM

## 2015-05-19 DIAGNOSIS — I2510 Atherosclerotic heart disease of native coronary artery without angina pectoris: Secondary | ICD-10-CM | POA: Diagnosis not present

## 2015-05-19 NOTE — Assessment & Plan Note (Addendum)
Onset in calf after fall with large hematoma to thigh; tx initially with Vanc, then rocephin; SNF - pt is d/c on keflex and clinda for a week

## 2015-05-19 NOTE — Assessment & Plan Note (Signed)
SNF - mod controlled on lopressor, lisinopril and lasix

## 2015-05-19 NOTE — Assessment & Plan Note (Signed)
SNF - no recent A1c, not stated as uncontrolled; cont onglyza 5 mg; pt is on ACE and statin

## 2015-05-19 NOTE — Assessment & Plan Note (Signed)
SNF - plan to cont Vit D 1000u daily

## 2015-05-19 NOTE — Progress Notes (Signed)
MRN: [id number] Name: Reginald [name]  Sex: male Age: 79 y.o. DOB: 11/21/1935  [place] [facility]: [facility] Facility/Room:112 Level Of Care: SNF Provider: Inocencio Homes D Emergency Contacts: Extended Emergency Contact Information Primary Emergency Contact: Elder,Margie Address: [place] Phone: [phone] Relation: Friend  Code Status:   Allergies: Review of patient's allergies indicates no known allergies.  Chief Complaint  Patient presents with  . New Admit To SNF    HPI: Patient is 79 y.o. male with coronary artery disease, history of PTCA stenting to LAD in remote past, hypertension, diabetes mellitus, chronic kidney disease stage 3, chronic atrial fibrillation, anemia of chronic disease, hyperlipidemia, morbid obesity, obstructive sleep apnea, degenerative joint disease, was admitted by triad hospitalist because of fall which he sustained on October 26, was seen in the ED and was found to have soft tissue swelling with hematoma. He had appointment as outpatient at [facility], but as the symptoms got progressively worse involving swelling of the thigh with large area of ecchymosis and multiple large blisters, so decided to come to ED as he has difficulty ambulating and taking care of himself. Pt was admitted to [facility] from 10/27-11/7 where pt was tx with vancomycin, then rocephin when a UTI was suspected but not realized. Leg improved very slowly. Hospital course was complicated by ARF, first thought to be from Vancomycin, ACE and diuretics but subsequently found to be obstructive and improved with a foley. Pt is admitted to SNF for generalized weakness and for OT/Pt as the pt requires at least 2 people to get him just out of bed.While at SNF pt will be followed for AF, tx with xarelto, amiodarone and lopressor, HLD, tx with lipitor, and BPH , tx with flomax.  Past Medical History  Diagnosis Date  . Type II or unspecified type  diabetes mellitus without mention of complication, not stated as uncontrolled   . Obesity, unspecified   . Unspecified essential hypertension   . Coronary atherosclerosis of unspecified type of vessel, native or graft   . Vestibulopathy   . Hypokalemia   . Atrial fibrillation ([place])   . Rhabdomyolysis   . Hypercholesteremia   . COPD (chronic obstructive pulmonary disease) ([place])   . OSA (obstructive sleep apnea)   . Anteroseptal myocardial infarction ([place])   . Obesity hypoventilation syndrome ([place])   . Hypertensive renal disease   . Chronic kidney disease   . CHF (congestive heart failure) ([place])   . Diverticulosis   . Glaucoma     Past Surgical History  Procedure Laterality Date  . Inguinal hernia repair Right 1985  . Coronary artery bypass graft  1997    PCI to LAD  . Nose surgery  1970s    Per Dr. Terrence Dupont [facility] in pt chart  . Knee arthroscopy Left 1991  . Lumbar fusion    . Appendectomy  1985  . Cholecystectomy  06/2000      Medication List       This list is accurate as of: 05/19/15  7:07 PM.  Always use your most recent med list.               ACIDOPHILUS PROBIOTIC 10 MG Tabs  Take 10 mg by mouth 3 (three) times daily.     amiodarone 200 MG tablet  Commonly known as:  PACERONE  Take 200 mg by mouth at bedtime.     atorvastatin 20 MG tablet  Commonly known as:  LIPITOR  Take 20 mg by mouth daily at 6 PM.     cephALEXin 500 MG capsule  Commonly known as:  KEFLEX  Take 1 capsule (500 mg total) by mouth 2 (two) times daily.     cholecalciferol 1000 UNITS tablet  Commonly known as:  VITAMIN D  Take 1,000 Units by mouth daily.     collagenase ointment  Commonly known as:  SANTYL  Apply topically daily.     HYDROcodone-acetaminophen 5-325 MG tablet  Commonly known as:  NORCO  Take 1-2 tablets by mouth every 6 (six) hours as needed for moderate pain or severe pain.     latanoprost 0.005 % ophthalmic solution  Commonly known as:  XALATAN  Place 1 drop  into both eyes at bedtime.     metoprolol tartrate 25 MG tablet  Commonly known as:  LOPRESSOR  Take 1 tablet (25 mg total) by mouth 2 (two) times daily.     nitroGLYCERIN 0.4 MG SL tablet  Commonly known as:  NITROSTAT  Place 0.4 mg under the tongue every 5 (five) minutes as needed for chest pain.     saxagliptin HCl 5 MG Tabs tablet  Commonly known as:  ONGLYZA  Take 5 mg by mouth daily.     tamsulosin 0.4 MG Caps capsule  Commonly known as:  FLOMAX  Take 1 capsule (0.4 mg total) by mouth daily.     XARELTO 15 MG Tabs tablet  Generic drug:  Rivaroxaban  Take 15 mg by mouth daily with supper.        No orders of the defined types were placed in this encounter.     There is no immunization history on file for this patient.  Social History  Substance Use Topics  . Smoking status: Never Smoker   . Smokeless tobacco: Never Used  . Alcohol Use: No    Family history is + COPD, DM2  Review of Systems  DATA OBTAINED: from patient, nurse, pt's SO GENERAL:  no fevers, fatigue, appetite changes SKIN: better EYES: No eye pain, redness, discharge EARS: No earache, tinnitus, change in hearing NOSE: No congestion, drainage or bleeding  MOUTH/THROAT: No mouth or tooth pain, No sore throat RESPIRATORY: No cough, wheezing, SOB, normally uses CPAP CARDIAC: No chest pain, palpitations, lower extremity edema  GI: No abdominal pain, No N/V/D or constipation, No heartburn or reflux  GU: No dysuria, frequency or urgency, or incontinence  MUSCULOSKELETAL: No unrelieved bone/joint pain NEUROLOGIC: No headache, dizziness or focal weakness PSYCHIATRIC: No c/o anxiety or sadness   Filed Vitals:   05/19/15 1702  BP: 149/77  Pulse: 99  Temp: 98.2 F (36.8 C)  Resp: 16    SpO2 Readings from Last 1 Encounters:  05/18/15 95%        Physical Exam  GENERAL APPEARANCE: Alert, mod conversant,very obese WM,  No acute distress.  SKIN: resoving bruise distal thigh,scattered  ulcer  beds healing on leg, still mod redness and heat, more pronounced distally HEAD: Normocephalic, atraumatic  EYES: Conjunctiva/lids clear. Pupils round, reactive. EOMs intact.  EARS: External exam WNL, canals clear. Hearing grossly normal.  NOSE: No deformity or discharge.  MOUTH/THROAT: Lips w/o lesions  RESPIRATORY: Breathing is even, unlabored. Lung sounds are clear   CARDIOVASCULAR: Heart irreg, no murmurs, rubs or gallops. 1+ peripheral edema.   GASTROINTESTINAL: Abdomen is soft, non-tender, not distended w/ normal bowel sounds. GENITOURINARY: Bladder non tender, not distended  MUSCULOSKELETAL: No abnormal joints or musculature NEUROLOGIC:  Cranial nerves 2-12 grossly intact. Moves all extremities  PSYCHIATRIC: Mood and affect appropriate to situation, no behavioral issues  Patient Active Problem List   Diagnosis Date Noted  . Hypertensive heart disease with CHF (congestive heart failure) ([place]) 05/19/2015  . CAD (coronary artery disease), native coronary artery 05/19/2015  . Hyperlipidemia 05/19/2015  . Vitamin D deficiency 05/19/2015  . Pressure ulcer 05/16/2015  . Diastolic dysfunction with chronic heart failure ([place]) 05/08/2015  . Blisters of multiple sites 05/08/2015  . Type 2 diabetes mellitus ([place])   . Obesity, unspecified   . OSA (obstructive sleep apnea)   . Atrial fibrillation ([place])   . Secondary DM with CKD stage 4 and hypertension ([place])   . Glaucoma   . Cellulitis 05/07/2015    CBC    Component Value Date/Time   WBC 14.6* 05/18/2015 0844   RBC 4.19* 05/18/2015 0844   HGB 11.9* 05/18/2015 0844   HCT 37.2* 05/18/2015 0844   PLT 454* 05/18/2015 0844   MCV 88.8 05/18/2015 0844   LYMPHSABS 1.4 05/07/2015 2237   MONOABS 1.2* 05/07/2015 2237   EOSABS 0.2 05/07/2015 2237   BASOSABS 0.0 05/07/2015 2237    CMP     Component Value Date/Time   NA 144 05/18/2015 0844   K 4.3 05/18/2015 0844   CL 107 05/18/2015 0844   CO2 25 05/18/2015 0844   GLUCOSE 170*  05/18/2015 0844   BUN 53* 05/18/2015 0844   CREATININE 1.99* 05/18/2015 0844   CALCIUM 8.9 05/18/2015 0844   PROT 5.5* 05/08/2015 0435   ALBUMIN 2.9* 05/08/2015 0435   AST 18 05/08/2015 0435   ALT 21 05/08/2015 0435   ALKPHOS 58 05/08/2015 0435   BILITOT 1.0 05/08/2015 0435   GFRNONAA 31* 05/18/2015 0844   GFRAA 36* 05/18/2015 0844    Lab Results  Component Value Date   HGBA1C * 08/11/2010    6.5 (NOTE)                                                                       According to the ADA Clinical Practice Recommendations for 2011, when HbA1c is used as a screening test:   >=6.5%   Diagnostic of Diabetes Mellitus           (if abnormal result  is confirmed)  5.7-6.4%   Increased risk of developing Diabetes Mellitus  References:Diagnosis and Classification of Diabetes Mellitus,Diabetes [id number] 1):S62-S69 and Standards of Medical Care in         Diabetes - 2011,Diabetes Care,2011,34  (Suppl 1):S11-S61.     Dg Knee Complete 4 Views Right  05/07/2015  CLINICAL DATA:  Worsening pain and wound infection. EXAM: RIGHT KNEE - COMPLETE 4+ VIEW COMPARISON:  May 05, 2015. FINDINGS: There is no evidence of fracture, dislocation, or joint effusion. Mild narrowing of the medial and lateral joint spaces are noted with osteophyte formation. Soft tissues are unremarkable. IMPRESSION: Mild degenerative joint disease. No acute abnormality seen in the right knee. Electronically Signed   By: Marijo Conception, M.D.   On: 05/07/2015 21:15    Not all labs, radiology exams or other studies done during hospitalization come through on my EPIC note; however they are reviewed by me.    Assessment and Plan  Cellulitis Onset in calf after fall with large hematoma to thigh; tx initially with Vanc, then rocephin; SNF - pt is d/c on keflex 500 mg BIS and clinda 450 mg TID  for a week  Blisters of multiple sites Onset in calf after fall with large hematoma to thigh; tx initially with Vanc, then  rocephin; SNF - pt is d/c on keflex and clinda for a week   Atrial fibrillation (HCC) SNF - stable; cont amiodarone, lopressor and xarelto as prophylaxis  Hypertensive heart disease with CHF (congestive heart failure) ([place]) SNF - mod controlled on lopressor, lisinopril and lasix  Diastolic dysfunction with chronic heart failure (HCC) SNF - chronic and stable; cont lasix 40 mg, lopressor 50 mg BID  And lisinopril 40 mg BID; Plan - cont all;folloew renal fx with BMP  CAD (coronary artery disease), native coronary artery SNF p s/p stent; cont xarelto, bblocker, statin  OSA (obstructive sleep apnea) SNF - pt uses CPAP at home every night but wasn't used in hospital, pt would like to use it at SNF;orders for that have been written  Type 2 diabetes mellitus ([place]) SNF - no recent A1c, not stated as uncontrolled; cont onglyza 5 mg; pt is on ACE and statin  Secondary DM with CKD stage 4 and hypertension ([place]) SNF - d/c BUN/Cr was 67/2.37; will f/u BMP  Hyperlipidemia SNF - cont lipitor 20 mg  Vitamin D deficiency SNF - plan to cont Vit D 1000u daily   Time spent 45 min;> 50% of time with patient was spent reviewing records, labs, tests and studies, counseling and developing plan of care  Hennie Duos, MD

## 2015-05-19 NOTE — Assessment & Plan Note (Signed)
SNF - pt uses CPAP at home every night but wasn't used in hospital, pt would like to use it at SNF;orders for that have been written

## 2015-05-19 NOTE — Assessment & Plan Note (Addendum)
Onset in calf after fall with large hematoma to thigh; tx initially with Vanc, then rocephin; SNF - pt is d/c on keflex 500 mg BIS and clinda 450 mg TID  for a week

## 2015-05-19 NOTE — Assessment & Plan Note (Signed)
SNF p s/p stent; cont xarelto, bblocker, statin

## 2015-05-19 NOTE — Assessment & Plan Note (Signed)
SNF - d/c BUN/Cr was 67/2.37; will f/u BMP

## 2015-05-19 NOTE — Assessment & Plan Note (Signed)
SNF - stable; cont amiodarone, lopressor and xarelto as prophylaxis

## 2015-05-19 NOTE — Assessment & Plan Note (Signed)
SNF - cont lipitor 20 mg

## 2015-05-19 NOTE — Assessment & Plan Note (Signed)
SNF - chronic and stable; cont lasix 40 mg, lopressor 50 mg BID  And lisinopril 40 mg BID; Plan - cont all;folloew renal fx with BMP

## 2015-05-26 DIAGNOSIS — L988 Other specified disorders of the skin and subcutaneous tissue: Secondary | ICD-10-CM | POA: Diagnosis not present

## 2015-05-26 DIAGNOSIS — L89323 Pressure ulcer of left buttock, stage 3: Secondary | ICD-10-CM | POA: Diagnosis not present

## 2015-05-28 ENCOUNTER — Non-Acute Institutional Stay (SKILLED_NURSING_FACILITY): Payer: Medicare Other | Admitting: Internal Medicine

## 2015-05-28 DIAGNOSIS — N184 Chronic kidney disease, stage 4 (severe): Secondary | ICD-10-CM | POA: Diagnosis not present

## 2015-05-28 DIAGNOSIS — I129 Hypertensive chronic kidney disease with stage 1 through stage 4 chronic kidney disease, or unspecified chronic kidney disease: Secondary | ICD-10-CM

## 2015-05-28 DIAGNOSIS — L0390 Cellulitis, unspecified: Secondary | ICD-10-CM | POA: Diagnosis not present

## 2015-05-28 DIAGNOSIS — I482 Chronic atrial fibrillation, unspecified: Secondary | ICD-10-CM

## 2015-05-28 DIAGNOSIS — E1322 Other specified diabetes mellitus with diabetic chronic kidney disease: Secondary | ICD-10-CM | POA: Diagnosis not present

## 2015-05-30 ENCOUNTER — Encounter: Payer: Self-pay | Admitting: Internal Medicine

## 2015-05-30 NOTE — Progress Notes (Signed)
Patient ID: KIING GROTTE, male   DOB: 12-11-1935, 79 y.o.   MRN: [id number] MRN: [id number] Name: Reginald [name]  Sex: male Age: 79 y.o. DOB: 1936-03-02  [place] [facility] Facility/Room:112 Level Of Care: SNF Provider: Wille Celeste Emergency Contacts: Extended Emergency Contact Information Primary Emergency Contact: [name] Address: [place] Phone: [phone] Relation: Friend  Code Status:   Allergies: Review of patient's allergies indicates no known allergies.  Chief Complaint  Patient presents with  . Acute Visit   Acute visit secondary to renal insufficiency mild hyperkalemia  HPI: Patient is 79 y.o. male with coronary artery disease, history of PTCA stenting to LAD in remote past, hypertension, diabetes mellitus, chronic kidney disease stage 3, chronic atrial fibrillation, anemia of chronic disease, hyperlipidemia, morbid obesity, obstructive sleep apnea, degenerative joint disease, was admitted by triad hospitalist because of fall which he sustained on October 26, was seen in the ED and was found to have soft tissue swelling with hematoma. He had appointment as outpatient at [facility], but as the symptoms got progressively worse involving swelling of the thigh with large area of ecchymosis and multiple large blisters, so decided to come to ED as he has difficulty ambulating and taking care of himself. Pt was admitted to [facility] from 10/27-11/7 where pt was tx with vancomycin, then rocephin when a UTI was suspected but not realized. Leg improved very slowly. Hospital course was complicated by ARF, first thought to be from Vancomycin, ACE and diuretics but subsequently found to be obstructive and improved with a foley. Pt  admitted to SNF for generalized weakness and for OT/Pt as the pt requires at least 2 people to get him just out of bed.While at SNF  followed for AF, tx with xarelto, amiodarone and lopressor,  HLD, tx with lipitor, and BPH , tx with flomax.  On hospital discharge creatinine was 1.99 BUN of 53-updated lab done yesterday shows her creatinine has risen up to 3.3 BUN of 76 potassium mildly elevated at 5.2.  He does continue on Lasix as well as lisinopril--he does have a history CHF--last echo I see showed normal systolic function 123456 percent in 2011    Past Medical History  Diagnosis Date  . Type II or unspecified type diabetes mellitus without mention of complication, not stated as uncontrolled   . Obesity, unspecified   . Unspecified essential hypertension   . Coronary atherosclerosis of unspecified type of vessel, native or graft   . Vestibulopathy   . Hypokalemia   . Atrial fibrillation ([place])   . Rhabdomyolysis   . Hypercholesteremia   . COPD (chronic obstructive pulmonary disease) ([place])   . OSA (obstructive sleep apnea)   . Anteroseptal myocardial infarction ([place])   . Obesity hypoventilation syndrome ([place])   . Hypertensive renal disease   . Chronic kidney disease   . CHF (congestive heart failure) ([place])   . Diverticulosis   . Glaucoma     Past Surgical History  Procedure Laterality Date  . Inguinal hernia repair Right 1985  . Coronary artery bypass graft  1997    PCI to LAD  . Nose surgery  1970s    Per Dr. Terrence Dupont [facility] in pt chart  . Knee arthroscopy Left 1991  . Lumbar fusion    . Appendectomy  1985  . Cholecystectomy  06/2000      Medication List       This  list is accurate as of: 05/28/15 11:59 PM.  Always use your most recent med list.               ACIDOPHILUS PROBIOTIC 10 MG Tabs  Take 10 mg by mouth 3 (three) times daily.     amiodarone 200 MG tablet  Commonly known as:  PACERONE  Take 200 mg by mouth at bedtime.     atorvastatin 20 MG tablet  Commonly known as:  LIPITOR  Take 20 mg by mouth daily at 6 PM.     cephALEXin 500 MG capsule  Commonly known as:  KEFLEX  Take 1 capsule (500 mg total) by mouth 2 (two) times daily.      cholecalciferol 1000 UNITS tablet  Commonly known as:  VITAMIN D  Take 1,000 Units by mouth daily.     collagenase ointment  Commonly known as:  SANTYL  Apply topically daily.     HYDROcodone-acetaminophen 5-325 MG tablet  Commonly known as:  NORCO  Take 1-2 tablets by mouth every 6 (six) hours as needed for moderate pain or severe pain.     latanoprost 0.005 % ophthalmic solution  Commonly known as:  XALATAN  Place 1 drop into both eyes at bedtime.     metoprolol tartrate 25 MG tablet  Commonly known as:  LOPRESSOR  Take 1 tablet (25 mg total) by mouth 2 (two) times daily.     nitroGLYCERIN 0.4 MG SL tablet  Commonly known as:  NITROSTAT  Place 0.4 mg under the tongue every 5 (five) minutes as needed for chest pain.     saxagliptin HCl 5 MG Tabs tablet  Commonly known as:  ONGLYZA  Take 5 mg by mouth daily.     tamsulosin 0.4 MG Caps capsule  Commonly known as:  FLOMAX  Take 1 capsule (0.4 mg total) by mouth daily.     XARELTO 15 MG Tabs tablet  Generic drug:  Rivaroxaban  Take 15 mg by mouth daily with supper.       note he is on Lasix 40 mg a day as well as potassium 20 mEq.  He is also on lisinopril 40 mg daily twice a day.  Antibiotic wise he is on clindamycin 450 mg 3 times a day.  He is no longer on Keflex  No orders of the defined types were placed in this encounter.     There is no immunization history on file for this patient.  Social History  Substance Use Topics  . Smoking status: Never Smoker   . Smokeless tobacco: Never Used  . Alcohol Use: No    Family history is + COPD, DM2  Review of Systems  DATA OBTAINED: from patient, nurse, pt's SO GENERAL:  no fevers, fatigue, appetite changes SKIN: There are stable to improving per wound care EYES: No eye pain, redness, discharge EARS: No earache, tinnitus, change in hearing NOSE: No congestion, drainage or bleeding  MOUTH/THROAT: No mouth or tooth pain, No sore throat RESPIRATORY: No cough,  wheezing, SOB, normally uses CPAP CARDIAC: No chest pain, palpitations, lower extremity edema  GI: No abdominal pain, No N/V/D or constipation, No heartburn or reflux  GU: No dysuria, frequency or urgency, or incontinence  MUSCULOSKELETAL: No unrelieved bone/joint pain NEUROLOGIC: No headache, dizziness or focal weakness PSYCHIATRIC: No c/o anxiety or sadness   Filed Vitals:   05/30/15 1813  BP: 129/68  Pulse: 78  Temp: 97.9 F (36.6 C)  Resp: 19    SpO2 Readings from  Last 1 Encounters:  05/18/15 95%        Physical Exam  GENERAL APPEARANCE: Alert,  conversant,very obese WM,  No acute distress.  SKIN:  Currently lower extremity wounds are covered per nursing these are stable to improving continues on antibiotic clindamycin HEAD: Normocephalic, atraumatic  EYES: Conjunctiva/lids clear. Pupils round, reactive. EOMs intact.  EARS: External exam WNL, canals clear. Hearing grossly normal.  NOSE: No deformity or discharge.  MOUTH/THROAT: Lips w/o lesions oropharynx clear mucous membranes moist RESPIRATORY: Breathing is even, unlabored. Lung sounds are clear   CARDIOVASCULAR: Heart irreg, no murmurs, rubs or gallops. 1+ peripheral edema.   GASTROINTESTINAL: Abdomen is soft, non-tender, not distended w/ normal bowel sounds.   MUSCULOSKELETAL: No abnormal joints or musculature--has significant weakness but is able to stand has gained strength- NEUROLOGIC:  Cranial nerves 2-12 grossly intact. Moves all extremities--he has what appears to be an intentional tremor of his upper extremities at times this occurs more with therapy and exerting himself  PSYCHIATRIC: Mood and affect appropriate to situation, no behavioral issues  Patient Active Problem List   Diagnosis Date Noted  . Hypertensive heart disease with CHF (congestive heart failure) ([place]) 05/19/2015  . CAD (coronary artery disease), native coronary artery 05/19/2015  . Hyperlipidemia 05/19/2015  . Vitamin D deficiency  05/19/2015  . Pressure ulcer 05/16/2015  . Diastolic dysfunction with chronic heart failure ([place]) 05/08/2015  . Blisters of multiple sites 05/08/2015  . Type 2 diabetes mellitus ([place])   . Obesity, unspecified   . OSA (obstructive sleep apnea)   . Atrial fibrillation ([place])   . Secondary DM with CKD stage 4 and hypertension ([place])   . Glaucoma   . Cellulitis 05/07/2015     Labs.  05/27/2015.  Sodium 137 potassium 5.2 BUN 76 creatinine 3.3.  WBC 14.4 hemoglobin 12.3 platelets 373 CBC    Component Value Date/Time   WBC 14.6* 05/18/2015 0844   RBC 4.19* 05/18/2015 0844   HGB 11.9* 05/18/2015 0844   HCT 37.2* 05/18/2015 0844   PLT 454* 05/18/2015 0844   MCV 88.8 05/18/2015 0844   LYMPHSABS 1.4 05/07/2015 2237   MONOABS 1.2* 05/07/2015 2237   EOSABS 0.2 05/07/2015 2237   BASOSABS 0.0 05/07/2015 2237    CMP     Component Value Date/Time   NA 144 05/18/2015 0844   K 4.3 05/18/2015 0844   CL 107 05/18/2015 0844   CO2 25 05/18/2015 0844   GLUCOSE 170* 05/18/2015 0844   BUN 53* 05/18/2015 0844   CREATININE 1.99* 05/18/2015 0844   CALCIUM 8.9 05/18/2015 0844   PROT 5.5* 05/08/2015 0435   ALBUMIN 2.9* 05/08/2015 0435   AST 18 05/08/2015 0435   ALT 21 05/08/2015 0435   ALKPHOS 58 05/08/2015 0435   BILITOT 1.0 05/08/2015 0435   GFRNONAA 31* 05/18/2015 0844   GFRAA 36* 05/18/2015 0844    Lab Results  Component Value Date   HGBA1C * 08/11/2010    6.5 (NOTE)                                                                       According to the ADA Clinical Practice Recommendations for 2011, when HbA1c is used as a screening test:   >=6.5%  Diagnostic of Diabetes Mellitus           (if abnormal result  is confirmed)  5.7-6.4%   Increased risk of developing Diabetes Mellitus  References:Diagnosis and Classification of Diabetes Mellitus,Diabetes [id number] 1):S62-S69 and Standards of Medical Care in         Diabetes - 2011,Diabetes Care,2011,34  (Suppl 1):S11-S61.      Dg Knee Complete 4 Views Right  05/07/2015  CLINICAL DATA:  Worsening pain and wound infection. EXAM: RIGHT KNEE - COMPLETE 4+ VIEW COMPARISON:  May 05, 2015. FINDINGS: There is no evidence of fracture, dislocation, or joint effusion. Mild narrowing of the medial and lateral joint spaces are noted with osteophyte formation. Soft tissues are unremarkable. IMPRESSION: Mild degenerative joint disease. No acute abnormality seen in the right knee. Electronically Signed   By: Marijo Conception, M.D.   On: 05/07/2015 21:15    Not all labs, radiology exams or other studies done during hospitalization come through on my EPIC note; however they are reviewed by me.    Assessment and Plan  #1-history of stage IV chronic kidney disease-creatinine has risen again-this was discussed with Dr. Sheppard Coil  at this point will discontinue the Lasix lisinopril as well as potassium and update a BMP will do this tomorrow as well as next week to keep an eye on the situation-clinically he appears to be doing well however.  #2 history of atrial fibrillation this appears rate controlled on amiodarone and Lopressor he is on Xarelto  For  prophylaxis.  #3 history of cellulitis lower extremitity-calf area-he is now on clindamycin per wound care this is stable to improving.  #4 history of diastolic dysfunction CHF-again will hold the Lasix and lisinopril-secondary to increased renal insufficiency Will update a metabolic panel.--Will have to be monitored for an increased edema or symptoms of progressing CHF  He is on Lopressor continues on this as well for atrial fibrillation.  #5 history diabetes type 2 this appears stable recent blood sugars appear to be largely in the mid to lower 100s continue current medicationOnglyza  #6-hypertension-lisinopril has been DC'd for now-he does continue on Lopressor this will need to be monitored consider hydralazine if blood pressure starts to trend up-monitor blood pressure every  shift  [id number]  [id number]      Latyra Jaye C,

## 2015-06-02 DIAGNOSIS — L988 Other specified disorders of the skin and subcutaneous tissue: Secondary | ICD-10-CM | POA: Diagnosis not present

## 2015-06-02 DIAGNOSIS — L89323 Pressure ulcer of left buttock, stage 3: Secondary | ICD-10-CM | POA: Diagnosis not present

## 2015-06-09 DIAGNOSIS — L89323 Pressure ulcer of left buttock, stage 3: Secondary | ICD-10-CM | POA: Diagnosis not present

## 2015-06-09 DIAGNOSIS — L89621 Pressure ulcer of left heel, stage 1: Secondary | ICD-10-CM | POA: Diagnosis not present

## 2015-06-09 DIAGNOSIS — L988 Other specified disorders of the skin and subcutaneous tissue: Secondary | ICD-10-CM | POA: Diagnosis not present

## 2015-06-09 DIAGNOSIS — S81801D Unspecified open wound, right lower leg, subsequent encounter: Secondary | ICD-10-CM | POA: Diagnosis not present

## 2015-06-16 DIAGNOSIS — S81801D Unspecified open wound, right lower leg, subsequent encounter: Secondary | ICD-10-CM | POA: Diagnosis not present

## 2015-06-16 DIAGNOSIS — L89621 Pressure ulcer of left heel, stage 1: Secondary | ICD-10-CM | POA: Diagnosis not present

## 2015-06-16 DIAGNOSIS — L89323 Pressure ulcer of left buttock, stage 3: Secondary | ICD-10-CM | POA: Diagnosis not present

## 2015-06-16 DIAGNOSIS — L988 Other specified disorders of the skin and subcutaneous tissue: Secondary | ICD-10-CM | POA: Diagnosis not present

## 2015-06-19 ENCOUNTER — Non-Acute Institutional Stay (SKILLED_NURSING_FACILITY): Payer: Medicare Other | Admitting: Internal Medicine

## 2015-06-19 DIAGNOSIS — N179 Acute kidney failure, unspecified: Secondary | ICD-10-CM

## 2015-06-19 DIAGNOSIS — G4733 Obstructive sleep apnea (adult) (pediatric): Secondary | ICD-10-CM | POA: Diagnosis not present

## 2015-06-19 DIAGNOSIS — L03115 Cellulitis of right lower limb: Secondary | ICD-10-CM

## 2015-06-23 DIAGNOSIS — S81801D Unspecified open wound, right lower leg, subsequent encounter: Secondary | ICD-10-CM | POA: Diagnosis not present

## 2015-06-23 DIAGNOSIS — L89621 Pressure ulcer of left heel, stage 1: Secondary | ICD-10-CM | POA: Diagnosis not present

## 2015-06-23 DIAGNOSIS — S81802D Unspecified open wound, left lower leg, subsequent encounter: Secondary | ICD-10-CM | POA: Diagnosis not present

## 2015-06-23 DIAGNOSIS — L89323 Pressure ulcer of left buttock, stage 3: Secondary | ICD-10-CM | POA: Diagnosis not present

## 2015-06-23 DIAGNOSIS — L988 Other specified disorders of the skin and subcutaneous tissue: Secondary | ICD-10-CM | POA: Diagnosis not present

## 2015-06-23 DIAGNOSIS — S81802A Unspecified open wound, left lower leg, initial encounter: Secondary | ICD-10-CM | POA: Diagnosis not present

## 2015-06-28 ENCOUNTER — Encounter: Payer: Self-pay | Admitting: Internal Medicine

## 2015-06-28 DIAGNOSIS — L03115 Cellulitis of right lower limb: Secondary | ICD-10-CM | POA: Insufficient documentation

## 2015-06-28 DIAGNOSIS — N179 Acute kidney failure, unspecified: Secondary | ICD-10-CM | POA: Insufficient documentation

## 2015-06-28 NOTE — Assessment & Plan Note (Addendum)
Hospital d/c BUN/Cr was 53/1.99; on 11/16 on routine lab BUN/Cr 76/3.3; pt's lasix, lisinopril and K+ was stopped and on 12/7 BUN/CR 43/2.3; BP has been OK;at some point may need to start Lasix again 2/2 CHF but pt should not use lisinopril again

## 2015-06-28 NOTE — Assessment & Plan Note (Signed)
Onset 12/6 and pt started on Doxycycline for 10 days

## 2015-06-28 NOTE — Assessment & Plan Note (Signed)
Pt has been using his CPAP nightly as orderd at [facility] pt has been doing well

## 2015-06-28 NOTE — Progress Notes (Signed)
MRN: [id number] Name: Reginald [name]  Sex: male Age: 79 y.o. DOB: 1936/02/06  [place] [facility] Facility/Room:112 Level Of Care: SNF Provider: Inocencio Homes D Emergency Contacts: Extended Emergency Contact Information Primary Emergency Contact: Elder,Margie Address: [place] Phone: [phone] Relation: Friend  Code Status:   Allergies: Review of patient's allergies indicates no known allergies.  Chief Complaint  Patient presents with  . Medical Management of Chronic Issues    HPI: Patient is 79 y.o. male who was admitted to SNF after being hospitalized for leg hematoma and cellulitis who is being seen for f/uof  ARF and RLE cellulitis and for OSA.  Past Medical History  Diagnosis Date  . Type II or unspecified type diabetes mellitus without mention of complication, not stated as uncontrolled   . Obesity, unspecified   . Unspecified essential hypertension   . Coronary atherosclerosis of unspecified type of vessel, native or graft   . Vestibulopathy   . Hypokalemia   . Atrial fibrillation ([place])   . Rhabdomyolysis   . Hypercholesteremia   . COPD (chronic obstructive pulmonary disease) ([place])   . OSA (obstructive sleep apnea)   . Anteroseptal myocardial infarction ([place])   . Obesity hypoventilation syndrome ([place])   . Hypertensive renal disease   . Chronic kidney disease   . CHF (congestive heart failure) ([place])   . Diverticulosis   . Glaucoma     Past Surgical History  Procedure Laterality Date  . Inguinal hernia repair Right 1985  . Coronary artery bypass graft  1997    PCI to LAD  . Nose surgery  1970s    Per Dr. Terrence Dupont [facility] in pt chart  . Knee arthroscopy Left 1991  . Lumbar fusion    . Appendectomy  1985  . Cholecystectomy  06/2000      Medication List       This list is accurate as of: 06/19/15 11:59 PM.  Always use your most recent med list.               ACIDOPHILUS PROBIOTIC 10 MG Tabs  Take  10 mg by mouth 3 (three) times daily.     amiodarone 200 MG tablet  Commonly known as:  PACERONE  Take 200 mg by mouth at bedtime.     atorvastatin 20 MG tablet  Commonly known as:  LIPITOR  Take 20 mg by mouth daily at 6 PM.     cephALEXin 500 MG capsule  Commonly known as:  KEFLEX  Take 1 capsule (500 mg total) by mouth 2 (two) times daily.     cholecalciferol 1000 UNITS tablet  Commonly known as:  VITAMIN D  Take 1,000 Units by mouth daily.     collagenase ointment  Commonly known as:  SANTYL  Apply topically daily.     HYDROcodone-acetaminophen 5-325 MG tablet  Commonly known as:  NORCO  Take 1-2 tablets by mouth every 6 (six) hours as needed for moderate pain or severe pain.     latanoprost 0.005 % ophthalmic solution  Commonly known as:  XALATAN  Place 1 drop into both eyes at bedtime.     metoprolol tartrate 25 MG tablet  Commonly known as:  LOPRESSOR  Take 1 tablet (25 mg total) by mouth 2 (two) times daily.     nitroGLYCERIN 0.4 MG SL tablet  Commonly known as:  NITROSTAT  Place 0.4 mg under the tongue every 5 (  five) minutes as needed for chest pain.     saxagliptin HCl 5 MG Tabs tablet  Commonly known as:  ONGLYZA  Take 5 mg by mouth daily.     tamsulosin 0.4 MG Caps capsule  Commonly known as:  FLOMAX  Take 1 capsule (0.4 mg total) by mouth daily.     XARELTO 15 MG Tabs tablet  Generic drug:  Rivaroxaban  Take 15 mg by mouth daily with supper.        No orders of the defined types were placed in this encounter.     There is no immunization history on file for this patient.  Social History  Substance Use Topics  . Smoking status: Never Smoker   . Smokeless tobacco: Never Used  . Alcohol Use: No    Review of Systems  DATA OBTAINED: from patient, nurse GENERAL:  no fevers, fatigue, appetite changes SKIN: No itching, rash; cellulitis better HEENT: No complaint RESPIRATORY: No cough, wheezing, SOB CARDIAC: No chest pain, palpitations,  lower extremity edema  GI: No abdominal pain, No N/V/D or constipation, No heartburn or reflux  GU: No dysuria, frequency or urgency, or incontinence  MUSCULOSKELETAL: No unrelieved bone/joint pain NEUROLOGIC: No headache, dizziness  PSYCHIATRIC: No overt anxiety or sadness  Filed Vitals:   06/28/15 1057  BP: 139/68  Pulse: 72  Temp: 96.6 F (35.9 C)  Resp: 16    Physical Exam  GENERAL APPEARANCE: Alert, conversant, obese WM No acute distress  SKIN:RLE with some redness and warmth distally HEENT: Unremarkable RESPIRATORY: Breathing is even, unlabored. Lung sounds are clear   CARDIOVASCULAR: Heart irreg no murmurs, rubs or gallops. No peripheral edema  GASTROINTESTINAL: Abdomen is soft, non-tender, not distended w/ normal bowel sounds.  GENITOURINARY: Bladder non tender, not distended  MUSCULOSKELETAL: No abnormal joints or musculature NEUROLOGIC: Cranial nerves 2-12 grossly intact. Moves all extremities PSYCHIATRIC: Mood and affect appropriate to situation, no behavioral issues  Patient Active Problem List   Diagnosis Date Noted  . Cellulitis of leg, right 06/28/2015  . ARF (acute renal failure) ([place]) 06/28/2015  . Hypertensive heart disease with CHF (congestive heart failure) ([place]) 05/19/2015  . CAD (coronary artery disease), native coronary artery 05/19/2015  . Hyperlipidemia 05/19/2015  . Vitamin D deficiency 05/19/2015  . Pressure ulcer 05/16/2015  . Diastolic dysfunction with chronic heart failure ([place]) 05/08/2015  . Blisters of multiple sites 05/08/2015  . Type 2 diabetes mellitus ([place])   . Obesity, unspecified   . OSA (obstructive sleep apnea)   . Atrial fibrillation ([place])   . Secondary DM with CKD stage 4 and hypertension ([place])   . Glaucoma   . Cellulitis 05/07/2015    CBC    Component Value Date/Time   WBC 14.6* 05/18/2015 0844   RBC 4.19* 05/18/2015 0844   HGB 11.9* 05/18/2015 0844   HCT 37.2* 05/18/2015 0844   PLT 454* 05/18/2015 0844   MCV 88.8  05/18/2015 0844   LYMPHSABS 1.4 05/07/2015 2237   MONOABS 1.2* 05/07/2015 2237   EOSABS 0.2 05/07/2015 2237   BASOSABS 0.0 05/07/2015 2237    CMP     Component Value Date/Time   NA 144 05/18/2015 0844   K 4.3 05/18/2015 0844   CL 107 05/18/2015 0844   CO2 25 05/18/2015 0844   GLUCOSE 170* 05/18/2015 0844   BUN 53* 05/18/2015 0844   CREATININE 1.99* 05/18/2015 0844   CALCIUM 8.9 05/18/2015 0844   PROT 5.5* 05/08/2015 0435   ALBUMIN 2.9* 05/08/2015 0435   AST  18 05/08/2015 0435   ALT 21 05/08/2015 0435   ALKPHOS 58 05/08/2015 0435   BILITOT 1.0 05/08/2015 0435   GFRNONAA 31* 05/18/2015 0844   GFRAA 36* 05/18/2015 0844    Assessment and Plan  Cellulitis of leg, right Onset 12/6 and pt started on Doxycycline for 10 days  OSA (obstructive sleep apnea) Pt has been using his CPAP nightly as orderd at [facility]; pt has been doing well  ARF (acute renal failure) [facility] Hospital d/c BUN/Cr was 53/1.99; on 11/16 on routine lab BUN/Cr 76/3.3; pt's lasix, lisinopril and K+ was stopped and on 12/7 BUN/CR 43/2.3; BP has been OK;at some point may need to start Lasix again 2/2 CHF but pt should not use lisinopril again    Hennie Duos, MD

## 2015-06-30 DIAGNOSIS — L89323 Pressure ulcer of left buttock, stage 3: Secondary | ICD-10-CM | POA: Diagnosis not present

## 2015-06-30 DIAGNOSIS — S81801D Unspecified open wound, right lower leg, subsequent encounter: Secondary | ICD-10-CM | POA: Diagnosis not present

## 2015-06-30 DIAGNOSIS — L988 Other specified disorders of the skin and subcutaneous tissue: Secondary | ICD-10-CM | POA: Diagnosis not present

## 2015-06-30 DIAGNOSIS — S81802A Unspecified open wound, left lower leg, initial encounter: Secondary | ICD-10-CM | POA: Diagnosis not present

## 2015-06-30 DIAGNOSIS — S81802D Unspecified open wound, left lower leg, subsequent encounter: Secondary | ICD-10-CM | POA: Diagnosis not present

## 2015-06-30 DIAGNOSIS — L89621 Pressure ulcer of left heel, stage 1: Secondary | ICD-10-CM | POA: Diagnosis not present

## 2015-07-07 DIAGNOSIS — S81801D Unspecified open wound, right lower leg, subsequent encounter: Secondary | ICD-10-CM | POA: Diagnosis not present

## 2015-07-07 DIAGNOSIS — S81802A Unspecified open wound, left lower leg, initial encounter: Secondary | ICD-10-CM | POA: Diagnosis not present

## 2015-07-07 DIAGNOSIS — L89621 Pressure ulcer of left heel, stage 1: Secondary | ICD-10-CM | POA: Diagnosis not present

## 2015-07-07 DIAGNOSIS — L89323 Pressure ulcer of left buttock, stage 3: Secondary | ICD-10-CM | POA: Diagnosis not present

## 2015-07-07 DIAGNOSIS — S81802D Unspecified open wound, left lower leg, subsequent encounter: Secondary | ICD-10-CM | POA: Diagnosis not present

## 2015-07-07 DIAGNOSIS — L988 Other specified disorders of the skin and subcutaneous tissue: Secondary | ICD-10-CM | POA: Diagnosis not present

## 2015-07-12 DIAGNOSIS — I509 Heart failure, unspecified: Secondary | ICD-10-CM | POA: Diagnosis not present

## 2015-07-12 DIAGNOSIS — R310 Gross hematuria: Secondary | ICD-10-CM | POA: Diagnosis not present

## 2015-07-12 DIAGNOSIS — L89623 Pressure ulcer of left heel, stage 3: Secondary | ICD-10-CM | POA: Diagnosis not present

## 2015-07-12 DIAGNOSIS — N1330 Unspecified hydronephrosis: Secondary | ICD-10-CM | POA: Diagnosis not present

## 2015-07-12 DIAGNOSIS — E669 Obesity, unspecified: Secondary | ICD-10-CM | POA: Diagnosis not present

## 2015-07-12 DIAGNOSIS — L89621 Pressure ulcer of left heel, stage 1: Secondary | ICD-10-CM | POA: Diagnosis not present

## 2015-07-12 DIAGNOSIS — S81802A Unspecified open wound, left lower leg, initial encounter: Secondary | ICD-10-CM | POA: Diagnosis not present

## 2015-07-12 DIAGNOSIS — M6281 Muscle weakness (generalized): Secondary | ICD-10-CM | POA: Diagnosis not present

## 2015-07-12 DIAGNOSIS — L89323 Pressure ulcer of left buttock, stage 3: Secondary | ICD-10-CM | POA: Diagnosis not present

## 2015-07-12 DIAGNOSIS — I482 Chronic atrial fibrillation: Secondary | ICD-10-CM | POA: Diagnosis not present

## 2015-07-12 DIAGNOSIS — E119 Type 2 diabetes mellitus without complications: Secondary | ICD-10-CM | POA: Diagnosis not present

## 2015-07-12 DIAGNOSIS — N134 Hydroureter: Secondary | ICD-10-CM | POA: Diagnosis not present

## 2015-07-12 DIAGNOSIS — S81802D Unspecified open wound, left lower leg, subsequent encounter: Secondary | ICD-10-CM | POA: Diagnosis not present

## 2015-07-12 DIAGNOSIS — G4733 Obstructive sleep apnea (adult) (pediatric): Secondary | ICD-10-CM | POA: Diagnosis not present

## 2015-07-12 DIAGNOSIS — I70249 Atherosclerosis of native arteries of left leg with ulceration of unspecified site: Secondary | ICD-10-CM | POA: Diagnosis not present

## 2015-07-12 DIAGNOSIS — L988 Other specified disorders of the skin and subcutaneous tissue: Secondary | ICD-10-CM | POA: Diagnosis not present

## 2015-07-12 DIAGNOSIS — R2681 Unsteadiness on feet: Secondary | ICD-10-CM | POA: Diagnosis not present

## 2015-07-12 DIAGNOSIS — S80221D Blister (nonthermal), right knee, subsequent encounter: Secondary | ICD-10-CM | POA: Diagnosis not present

## 2015-07-12 DIAGNOSIS — I739 Peripheral vascular disease, unspecified: Secondary | ICD-10-CM | POA: Diagnosis not present

## 2015-07-12 DIAGNOSIS — L03115 Cellulitis of right lower limb: Secondary | ICD-10-CM | POA: Diagnosis not present

## 2015-07-12 DIAGNOSIS — R300 Dysuria: Secondary | ICD-10-CM | POA: Diagnosis not present

## 2015-07-12 DIAGNOSIS — E876 Hypokalemia: Secondary | ICD-10-CM | POA: Diagnosis not present

## 2015-07-12 DIAGNOSIS — S81801D Unspecified open wound, right lower leg, subsequent encounter: Secondary | ICD-10-CM | POA: Diagnosis not present

## 2015-07-12 DIAGNOSIS — Z9181 History of falling: Secondary | ICD-10-CM | POA: Diagnosis not present

## 2015-07-12 DIAGNOSIS — N189 Chronic kidney disease, unspecified: Secondary | ICD-10-CM | POA: Diagnosis not present

## 2015-07-12 DIAGNOSIS — E785 Hyperlipidemia, unspecified: Secondary | ICD-10-CM | POA: Diagnosis not present

## 2015-07-12 DIAGNOSIS — I10 Essential (primary) hypertension: Secondary | ICD-10-CM | POA: Diagnosis not present

## 2015-07-12 DIAGNOSIS — N184 Chronic kidney disease, stage 4 (severe): Secondary | ICD-10-CM | POA: Diagnosis not present

## 2015-07-12 DIAGNOSIS — D649 Anemia, unspecified: Secondary | ICD-10-CM | POA: Diagnosis not present

## 2015-07-12 DIAGNOSIS — I129 Hypertensive chronic kidney disease with stage 1 through stage 4 chronic kidney disease, or unspecified chronic kidney disease: Secondary | ICD-10-CM | POA: Diagnosis not present

## 2015-07-12 DIAGNOSIS — I70239 Atherosclerosis of native arteries of right leg with ulceration of unspecified site: Secondary | ICD-10-CM | POA: Diagnosis not present

## 2015-07-12 DIAGNOSIS — N183 Chronic kidney disease, stage 3 (moderate): Secondary | ICD-10-CM | POA: Diagnosis not present

## 2015-07-12 DIAGNOSIS — N400 Enlarged prostate without lower urinary tract symptoms: Secondary | ICD-10-CM | POA: Diagnosis not present

## 2015-07-12 DIAGNOSIS — D638 Anemia in other chronic diseases classified elsewhere: Secondary | ICD-10-CM | POA: Diagnosis not present

## 2015-07-12 DIAGNOSIS — I2510 Atherosclerotic heart disease of native coronary artery without angina pectoris: Secondary | ICD-10-CM | POA: Diagnosis not present

## 2015-07-12 DIAGNOSIS — Z0000 Encounter for general adult medical examination without abnormal findings: Secondary | ICD-10-CM | POA: Diagnosis not present

## 2015-07-16 DIAGNOSIS — S81802D Unspecified open wound, left lower leg, subsequent encounter: Secondary | ICD-10-CM | POA: Diagnosis not present

## 2015-07-16 DIAGNOSIS — L988 Other specified disorders of the skin and subcutaneous tissue: Secondary | ICD-10-CM | POA: Diagnosis not present

## 2015-07-16 DIAGNOSIS — S81801D Unspecified open wound, right lower leg, subsequent encounter: Secondary | ICD-10-CM | POA: Diagnosis not present

## 2015-07-16 DIAGNOSIS — S81802A Unspecified open wound, left lower leg, initial encounter: Secondary | ICD-10-CM | POA: Diagnosis not present

## 2015-07-16 DIAGNOSIS — L89621 Pressure ulcer of left heel, stage 1: Secondary | ICD-10-CM | POA: Diagnosis not present

## 2015-07-16 DIAGNOSIS — L89323 Pressure ulcer of left buttock, stage 3: Secondary | ICD-10-CM | POA: Diagnosis not present

## 2015-07-21 DIAGNOSIS — L89621 Pressure ulcer of left heel, stage 1: Secondary | ICD-10-CM | POA: Diagnosis not present

## 2015-07-21 DIAGNOSIS — S81801D Unspecified open wound, right lower leg, subsequent encounter: Secondary | ICD-10-CM | POA: Diagnosis not present

## 2015-07-21 DIAGNOSIS — L988 Other specified disorders of the skin and subcutaneous tissue: Secondary | ICD-10-CM | POA: Diagnosis not present

## 2015-07-21 DIAGNOSIS — S81802A Unspecified open wound, left lower leg, initial encounter: Secondary | ICD-10-CM | POA: Diagnosis not present

## 2015-07-21 DIAGNOSIS — S81802D Unspecified open wound, left lower leg, subsequent encounter: Secondary | ICD-10-CM | POA: Diagnosis not present

## 2015-07-21 DIAGNOSIS — L89323 Pressure ulcer of left buttock, stage 3: Secondary | ICD-10-CM | POA: Diagnosis not present

## 2015-07-22 ENCOUNTER — Encounter: Payer: Self-pay | Admitting: Internal Medicine

## 2015-07-22 ENCOUNTER — Non-Acute Institutional Stay (SKILLED_NURSING_FACILITY): Payer: Medicare Other | Admitting: Internal Medicine

## 2015-07-22 DIAGNOSIS — S81801D Unspecified open wound, right lower leg, subsequent encounter: Secondary | ICD-10-CM

## 2015-07-22 DIAGNOSIS — L03115 Cellulitis of right lower limb: Secondary | ICD-10-CM

## 2015-07-22 DIAGNOSIS — S81809A Unspecified open wound, unspecified lower leg, initial encounter: Secondary | ICD-10-CM | POA: Insufficient documentation

## 2015-07-22 DIAGNOSIS — N184 Chronic kidney disease, stage 4 (severe): Secondary | ICD-10-CM

## 2015-07-22 DIAGNOSIS — N183 Chronic kidney disease, stage 3 unspecified: Secondary | ICD-10-CM | POA: Insufficient documentation

## 2015-07-22 NOTE — Assessment & Plan Note (Signed)
Cr 2.3, improved and GFR 28; stable and hoilding;will monitor at intervals

## 2015-07-22 NOTE — Assessment & Plan Note (Signed)
Wounds are healing in but still open  pt with cellulitis again; clinda 300 mg TID for 14 days

## 2015-07-22 NOTE — Progress Notes (Signed)
MRN: [id number] Name: KORBY PELUSO  Sex: male Age: 80 y.o. DOB: 16-Jul-1935  [place] [facility]: [facility] Facility/Room: Level Of Care: SNF Provider: Inocencio Homes D Emergency Contacts: Extended Emergency Contact Information Primary Emergency Contact: Elder,Margie Address: [place] Phone: [phone] Relation: Friend  Code Status:   Allergies: Review of patient's allergies indicates no known allergies.  Chief Complaint  Patient presents with  . Medical Management of Chronic Issues    HPI: Patient is 80 y.o. male who was admitted to SNF after being hospitalized for leg hematoma and cellulitis who is being seen for  Leg wounds, cellulitis and CKD4.  Past Medical History  Diagnosis Date  . Type II or unspecified type diabetes mellitus without mention of complication, not stated as uncontrolled   . Obesity, unspecified   . Unspecified essential hypertension   . Coronary atherosclerosis of unspecified type of vessel, native or graft   . Vestibulopathy   . Hypokalemia   . Atrial fibrillation ([place])   . Rhabdomyolysis   . Hypercholesteremia   . COPD (chronic obstructive pulmonary disease) ([place])   . OSA (obstructive sleep apnea)   . Anteroseptal myocardial infarction ([place])   . Obesity hypoventilation syndrome ([place])   . Hypertensive renal disease   . Chronic kidney disease   . CHF (congestive heart failure) ([place])   . Diverticulosis   . Glaucoma     Past Surgical History  Procedure Laterality Date  . Inguinal hernia repair Right 1985  . Coronary artery bypass graft  1997    PCI to LAD  . Nose surgery  1970s    Per Dr. Terrence Dupont [facility] in pt chart  . Knee arthroscopy Left 1991  . Lumbar fusion    . Appendectomy  1985  . Cholecystectomy  06/2000      Medication List       This list is accurate as of: 07/22/15 11:59 PM.  Always use your most recent med list.               ACIDOPHILUS PROBIOTIC 10 MG Tabs  Take 10 mg by  mouth 3 (three) times daily.     amiodarone 200 MG tablet  Commonly known as:  PACERONE  Take 200 mg by mouth at bedtime.     atorvastatin 20 MG tablet  Commonly known as:  LIPITOR  Take 20 mg by mouth daily at 6 PM.     cephALEXin 500 MG capsule  Commonly known as:  KEFLEX  Take 1 capsule (500 mg total) by mouth 2 (two) times daily.     cholecalciferol 1000 units tablet  Commonly known as:  VITAMIN D  Take 1,000 Units by mouth daily.     collagenase ointment  Commonly known as:  SANTYL  Apply topically daily.     HYDROcodone-acetaminophen 5-325 MG tablet  Commonly known as:  NORCO  Take 1-2 tablets by mouth every 6 (six) hours as needed for moderate pain or severe pain.     latanoprost 0.005 % ophthalmic solution  Commonly known as:  XALATAN  Place 1 drop into both eyes at bedtime.     metoprolol tartrate 25 MG tablet  Commonly known as:  LOPRESSOR  Take 1 tablet (25 mg total) by mouth 2 (two) times daily.     nitroGLYCERIN 0.4 MG SL tablet  Commonly known as:  NITROSTAT  Place 0.4 mg under the tongue every 5 (five) minutes as  needed for chest pain.     saxagliptin HCl 5 MG Tabs tablet  Commonly known as:  ONGLYZA  Take 5 mg by mouth daily.     tamsulosin 0.4 MG Caps capsule  Commonly known as:  FLOMAX  Take 1 capsule (0.4 mg total) by mouth daily.     XARELTO 15 MG Tabs tablet  Generic drug:  Rivaroxaban  Take 15 mg by mouth daily with supper.        No orders of the defined types were placed in this encounter.     There is no immunization history on file for this patient.  Social History  Substance Use Topics  . Smoking status: Never Smoker   . Smokeless tobacco: Never Used  . Alcohol Use: No    Review of Systems  DATA OBTAINED: from patient, nurse GENERAL:  no fevers, fatigue, appetite changes SKIN: No itching, rash HEENT: No complaint RESPIRATORY: No cough, wheezing, SOB CARDIAC: No chest pain, palpitations, lower extremity edema  GI: No  abdominal pain, No N/V/D or constipation, No heartburn or reflux  GU: No dysuria, frequency or urgency, or incontinence  MUSCULOSKELETAL: No unrelieved bone/joint pain NEUROLOGIC: No headache, dizziness  PSYCHIATRIC: No overt anxiety or sadness  Filed Vitals:   07/26/15 2249  BP: 146/64  Pulse: 78  Temp: 97.2 F (36.2 C)  Resp: 16    Physical Exam  GENERAL APPEARANCE: Alert, conversant, No acute distress  SKIN: wounds are dressed, sime warmth and mild erythema HEENT: Unremarkable RESPIRATORY: Breathing is even, unlabored. Lung sounds are clear   CARDIOVASCULAR: Heart RRR no murmurs, rubs or gallops. No peripheral edema  GASTROINTESTINAL: Abdomen is soft, non-tender, not distended w/ normal bowel sounds.  GENITOURINARY: Bladder non tender, not distended  MUSCULOSKELETAL: No abnormal joints or musculature NEUROLOGIC: Cranial nerves 2-12 grossly intact. Moves all extremities PSYCHIATRIC: Mood and affect appropriate to situation, no behavioral issues  Patient Active Problem List   Diagnosis Date Noted  . Multiple open wounds of lower leg 07/22/2015  . CKD (chronic kidney disease) stage 4, GFR 15-29 ml/min (HCC) 07/22/2015  . Cellulitis of leg, right 06/28/2015  . ARF (acute renal failure) ([place]) 06/28/2015  . Hypertensive heart disease with CHF (congestive heart failure) ([place]) 05/19/2015  . CAD (coronary artery disease), native coronary artery 05/19/2015  . Hyperlipidemia 05/19/2015  . Vitamin D deficiency 05/19/2015  . Pressure ulcer 05/16/2015  . Diastolic dysfunction with chronic heart failure ([place]) 05/08/2015  . Blisters of multiple sites 05/08/2015  . Type 2 diabetes mellitus ([place])   . Obesity, unspecified   . OSA (obstructive sleep apnea)   . Atrial fibrillation ([place])   . Secondary DM with CKD stage 4 and hypertension ([place])   . Glaucoma   . Cellulitis 05/07/2015    CBC    Component Value Date/Time   WBC 14.6* 05/18/2015 0844   RBC 4.19* 05/18/2015 0844   HGB  11.9* 05/18/2015 0844   HCT 37.2* 05/18/2015 0844   PLT 454* 05/18/2015 0844   MCV 88.8 05/18/2015 0844   LYMPHSABS 1.4 05/07/2015 2237   MONOABS 1.2* 05/07/2015 2237   EOSABS 0.2 05/07/2015 2237   BASOSABS 0.0 05/07/2015 2237    CMP     Component Value Date/Time   NA 144 05/18/2015 0844   K 4.3 05/18/2015 0844   CL 107 05/18/2015 0844   CO2 25 05/18/2015 0844   GLUCOSE 170* 05/18/2015 0844   BUN 53* 05/18/2015 0844   CREATININE 1.99* 05/18/2015 0844   CALCIUM 8.9  05/18/2015 0844   PROT 5.5* 05/08/2015 0435   ALBUMIN 2.9* 05/08/2015 0435   AST 18 05/08/2015 0435   ALT 21 05/08/2015 0435   ALKPHOS 58 05/08/2015 0435   BILITOT 1.0 05/08/2015 0435   GFRNONAA 31* 05/18/2015 0844   GFRAA 36* 05/18/2015 0844    Assessment and Plan  Multiple open wounds of lower leg Spoke with wound nurse today ;all wounds are healing in;plan - will cont wound care  Cellulitis of leg, right Wounds are healing in but still open  pt with cellulitis again; clinda 300 mg TID for 14 days   CKD (chronic kidney disease) stage 4, GFR 15-29 ml/min (HCC) Cr 2.3, improved and GFR 28; stable and hoilding;will monitor at intervals    Hennie Duos, MD

## 2015-07-22 NOTE — Assessment & Plan Note (Signed)
Spoke with wound nurse today ;all wounds are healing in;plan - will cont wound care

## 2015-07-26 ENCOUNTER — Encounter: Payer: Self-pay | Admitting: Internal Medicine

## 2015-07-31 DIAGNOSIS — L988 Other specified disorders of the skin and subcutaneous tissue: Secondary | ICD-10-CM | POA: Diagnosis not present

## 2015-07-31 DIAGNOSIS — S81801D Unspecified open wound, right lower leg, subsequent encounter: Secondary | ICD-10-CM | POA: Diagnosis not present

## 2015-07-31 DIAGNOSIS — L89623 Pressure ulcer of left heel, stage 3: Secondary | ICD-10-CM | POA: Diagnosis not present

## 2015-08-04 DIAGNOSIS — L89623 Pressure ulcer of left heel, stage 3: Secondary | ICD-10-CM | POA: Diagnosis not present

## 2015-08-04 DIAGNOSIS — S81801D Unspecified open wound, right lower leg, subsequent encounter: Secondary | ICD-10-CM | POA: Diagnosis not present

## 2015-08-04 DIAGNOSIS — L988 Other specified disorders of the skin and subcutaneous tissue: Secondary | ICD-10-CM | POA: Diagnosis not present

## 2015-08-07 DIAGNOSIS — N400 Enlarged prostate without lower urinary tract symptoms: Secondary | ICD-10-CM | POA: Diagnosis not present

## 2015-08-07 DIAGNOSIS — I482 Chronic atrial fibrillation: Secondary | ICD-10-CM | POA: Diagnosis not present

## 2015-08-07 DIAGNOSIS — I2510 Atherosclerotic heart disease of native coronary artery without angina pectoris: Secondary | ICD-10-CM | POA: Diagnosis not present

## 2015-08-07 DIAGNOSIS — D649 Anemia, unspecified: Secondary | ICD-10-CM | POA: Diagnosis not present

## 2015-08-07 DIAGNOSIS — E785 Hyperlipidemia, unspecified: Secondary | ICD-10-CM | POA: Diagnosis not present

## 2015-08-07 DIAGNOSIS — G4733 Obstructive sleep apnea (adult) (pediatric): Secondary | ICD-10-CM | POA: Diagnosis not present

## 2015-08-07 DIAGNOSIS — I129 Hypertensive chronic kidney disease with stage 1 through stage 4 chronic kidney disease, or unspecified chronic kidney disease: Secondary | ICD-10-CM | POA: Diagnosis not present

## 2015-08-07 DIAGNOSIS — N189 Chronic kidney disease, unspecified: Secondary | ICD-10-CM | POA: Diagnosis not present

## 2015-08-07 DIAGNOSIS — E669 Obesity, unspecified: Secondary | ICD-10-CM | POA: Diagnosis not present

## 2015-08-07 DIAGNOSIS — E119 Type 2 diabetes mellitus without complications: Secondary | ICD-10-CM | POA: Diagnosis not present

## 2015-08-13 ENCOUNTER — Non-Acute Institutional Stay (SKILLED_NURSING_FACILITY): Payer: Medicare Other | Admitting: Internal Medicine

## 2015-08-13 ENCOUNTER — Encounter: Payer: Self-pay | Admitting: Internal Medicine

## 2015-08-13 DIAGNOSIS — N184 Chronic kidney disease, stage 4 (severe): Secondary | ICD-10-CM | POA: Diagnosis not present

## 2015-08-13 DIAGNOSIS — N134 Hydroureter: Secondary | ICD-10-CM | POA: Diagnosis not present

## 2015-08-13 DIAGNOSIS — R310 Gross hematuria: Secondary | ICD-10-CM | POA: Diagnosis not present

## 2015-08-13 DIAGNOSIS — N1330 Unspecified hydronephrosis: Secondary | ICD-10-CM | POA: Diagnosis not present

## 2015-08-13 DIAGNOSIS — I10 Essential (primary) hypertension: Secondary | ICD-10-CM

## 2015-08-13 DIAGNOSIS — E876 Hypokalemia: Secondary | ICD-10-CM | POA: Diagnosis not present

## 2015-08-13 DIAGNOSIS — R300 Dysuria: Secondary | ICD-10-CM | POA: Diagnosis not present

## 2015-08-13 DIAGNOSIS — E1159 Type 2 diabetes mellitus with other circulatory complications: Secondary | ICD-10-CM | POA: Insufficient documentation

## 2015-08-13 NOTE — Progress Notes (Signed)
Patient ID: BENEDETTO VIDAURRI, male   DOB: [birth year], 80 y.o.   MRN: [id number] MRN: [id number] Name: NELTON NIP  Sex: male Age: 80 y.o. DOB: [birth year]  [place] [facility] Facility/Room: Level Of Care: SNF Provider: Wille Celeste Emergency Contacts: Extended Emergency Contact Information Primary Emergency Contact: [name] Address: [place] Phone: [phone] Relation: Friend  Code Status:   Allergies: Review of patient's allergies indicates no known allergies.  Chief Complaint  Patient presents with  . Acute Visit   follow-up hypokalemia-chronic kidney disease  HPI: Patient is 80 y.o. male who was admitted to SNF after being hospitalized for leg hematoma and cellulitis who is being seen for hypokalemia with history of chronic renal disease Routine lab on February 1 showed a low potassium of 3.3 with a BUN of 14 creatinine 1.4.  Potassium was supplemented yesterday lab today shows potassium is 3.2.--Creatinine is 1.5  --  There are orders for potassium 30 mEq a day.--- He had been on 20 mEq a da--y--and again has additional supplementation yesterday.  He continues on Lasix 40 mg a day as well  I also note per review of blood pressures at times he has elevated readings 147/87-141/74 I got 144/82 manually today he is on Lopressor 25 mg twice a day.        Past Medical History  Diagnosis Date  . Type II or unspecified type diabetes mellitus without mention of complication, not stated as uncontrolled   . Obesity, unspecified   . Unspecified essential hypertension   . Coronary atherosclerosis of unspecified type of vessel, native or graft   . Vestibulopathy   . Hypokalemia   . Atrial fibrillation ([place])   . Rhabdomyolysis   . Hypercholesteremia   . COPD (chronic obstructive pulmonary disease) ([place])   . OSA (obstructive sleep apnea)   . Anteroseptal myocardial infarction ([place])   . Obesity hypoventilation  syndrome ([place])   . Hypertensive renal disease   . Chronic kidney disease   . CHF (congestive heart failure) ([place])   . Diverticulosis   . Glaucoma     Past Surgical History  Procedure Laterality Date  . Inguinal hernia repair Right 1985  . Coronary artery bypass graft  1997    PCI to LAD  . Nose surgery  1970s    Per Dr. Terrence Dupont [facility] in pt chart  . Knee arthroscopy Left 1991  . Lumbar fusion    . Appendectomy  1985  . Cholecystectomy  06/2000      Medication List       This list is accurate as of: 08/13/15 11:59 PM.  Always use your most recent med list.               ACIDOPHILUS PROBIOTIC 10 MG Tabs  Take 10 mg by mouth 3 (three) times daily.     amiodarone 200 MG tablet  Commonly known as:  PACERONE  Take 200 mg by mouth at bedtime.     atorvastatin 20 MG tablet  Commonly known as:  LIPITOR  Take 20 mg by mouth daily at 6 PM.     cholecalciferol 1000 units tablet  Commonly known as:  VITAMIN D  Take 1,000 Units by mouth daily.     collagenase ointment  Commonly known as:  SANTYL  Apply topically daily.     furosemide 40 MG tablet  Commonly known as:  LASIX  Take 40  mg by mouth.     HYDROcodone-acetaminophen 5-325 MG tablet  Commonly known as:  NORCO  Take 1-2 tablets by mouth every 6 (six) hours as needed for moderate pain or severe pain.     latanoprost 0.005 % ophthalmic solution  Commonly known as:  XALATAN  Place 1 drop into both eyes at bedtime.     metoprolol tartrate 25 MG tablet  Commonly known as:  LOPRESSOR  Take 1 tablet (25 mg total) by mouth 2 (two) times daily.     nitroGLYCERIN 0.4 MG SL tablet  Commonly known as:  NITROSTAT  Place 0.4 mg under the tongue every 5 (five) minutes as needed for chest pain.     potassium chloride SA 20 MEQ tablet  Commonly known as:  K-DUR,KLOR-CON  Take 40 mEq by mouth daily.     saxagliptin HCl 5 MG Tabs tablet  Commonly known as:  ONGLYZA  Take 5 mg by mouth daily.     tamsulosin 0.4 MG Caps  capsule  Commonly known as:  FLOMAX  Take 1 capsule (0.4 mg total) by mouth daily.     XARELTO 15 MG Tabs tablet  Generic drug:  Rivaroxaban  Take 15 mg by mouth daily with supper.        Meds ordered this encounter  Medications  . furosemide (LASIX) 40 MG tablet    Sig: Take 40 mg by mouth.  . potassium chloride SA (K-DUR,KLOR-CON) 20 MEQ tablet    Sig: Take 40 mEq by mouth daily.     There is no immunization history on file for this patient.  Social History  Substance Use Topics  . Smoking status: Never Smoker   . Smokeless tobacco: Never Used  . Alcohol Use: No    Review of Systems  DATA OBTAINED: from patient, nurse GENERAL:  no fevers, fatigue, appetite changes SKIN: No itching, rash--being treated for lower extremity cellulitis HEENT: No complaint RESPIRATORY: No cough, wheezing, SOB CARDIAC: No chest pain, palpitations, lower extremity edema  GI: No abdominal pain, No N/V/D or constipation, No heartburn or reflux  GU: No dysuria, frequency or urgency, or incontinence  MUSCULOSKELETAL: No unrelieved bone/joint pain NEUROLOGIC: No headache, dizziness  PSYCHIATRIC: No overt anxiety or sadness  Filed Vitals:   08/13/15 2238  BP: 144/80  Pulse: 80  Temp: 97.5 F (36.4 C)  Resp: 18    Physical Exam  GENERAL APPEARANCE: Alert, conversant, No acute distress  SKIN: wounds are dressed, is followed by wound care HEENT: Unremarkable RESPIRATORY: Breathing is even, unlabored. Lung sounds are clear   CARDIOVASCULAR: Heart RRR no murmurs, rubs or gallops. Venous stasis changes/ 2+ lower extremity peripheral edema  GASTROINTESTINAL: Abdomen is soft, non-tender, obese w/ normal bowel sounds.  GENITOURINARY: Bladder non tender, not distended  MUSCULOSKELETAL: No abnormal joints or musculature lower extremities are currently covered as noted above NEUROLOGIC: Cranial nerves 2-12 grossly intact. Moves all extremities PSYCHIATRIC: Mood and affect appropriate to  situation, no behavioral issues  Patient Active Problem List   Diagnosis Date Noted  . Hypokalemia 08/13/2015  . HTN (hypertension) 08/13/2015  . Multiple open wounds of lower leg 07/22/2015  . CKD (chronic kidney disease) stage 4, GFR 15-29 ml/min (HCC) 07/22/2015  . Cellulitis of leg, right 06/28/2015  . ARF (acute renal failure) ([place]) 06/28/2015  . Hypertensive heart disease with CHF (congestive heart failure) ([place]) 05/19/2015  . CAD (coronary artery disease), native coronary artery 05/19/2015  . Hyperlipidemia 05/19/2015  . Vitamin D deficiency 05/19/2015  .  Pressure ulcer 05/16/2015  . Diastolic dysfunction with chronic heart failure ([place]) 05/08/2015  . Blisters of multiple sites 05/08/2015  . Type 2 diabetes mellitus ([place])   . Obesity, unspecified   . OSA (obstructive sleep apnea)   . Atrial fibrillation ([place])   . Secondary DM with CKD stage 4 and hypertension ([place])   . Glaucoma   . Cellulitis 05/07/2015   Labs.  08/13/2015.  Sodium 144 potassium 3.2 BUN 22 creatinine 1.5 CO2 30.  Magnesium 1.7.  08/12/2015.  Sodium 143 potassium 3.3 BUN 15 creatinine 1.4.  Creatinine previously was 1.8 on January 30.  WBC 10.7 hemoglobin 14.2 platelets 292.  08/09/1998 1017.  Sodium 144 potassium 3.3 BUN 22 creatinine 1.8.  Albumin 3.1 otherwise liver function tests within normal limits.  TSH 3.8.  Hemoglobin A1c 5.6 CBC    Component Value Date/Time   WBC 14.6* 05/18/2015 0844   RBC 4.19* 05/18/2015 0844   HGB 11.9* 05/18/2015 0844   HCT 37.2* 05/18/2015 0844   PLT 454* 05/18/2015 0844   MCV 88.8 05/18/2015 0844   LYMPHSABS 1.4 05/07/2015 2237   MONOABS 1.2* 05/07/2015 2237   EOSABS 0.2 05/07/2015 2237   BASOSABS 0.0 05/07/2015 2237    CMP     Component Value Date/Time   NA 144 05/18/2015 0844   K 4.3 05/18/2015 0844   CL 107 05/18/2015 0844   CO2 25 05/18/2015 0844   GLUCOSE 170* 05/18/2015 0844   BUN 53* 05/18/2015 0844   CREATININE 1.99* 05/18/2015  0844   CALCIUM 8.9 05/18/2015 0844   PROT 5.5* 05/08/2015 0435   ALBUMIN 2.9* 05/08/2015 0435   AST 18 05/08/2015 0435   ALT 21 05/08/2015 0435   ALKPHOS 58 05/08/2015 0435   BILITOT 1.0 05/08/2015 0435   GFRNONAA 31* 05/18/2015 0844   GFRAA 36* 05/18/2015 0844    Assessment and Plan  #1 history of hypokalemia-this is being supplemented we'll give 40 mEq of potassium today in addition to the 30th make use previously given-then will decrease to 40 MG Q's daily-recheck a BMP on Monday, February 6.  Also will supplement magnesium secondary to magnesium of 1.7 on lab done today.  #2 hypertension he continues on Lopressor-systolic is mildly elevated would like to get more readings to assess for consistency here wi ll order blood pressure checks with a manual cuff I suspect machine readings are  somewhat inaccurate readings the patient has a rather large arm --log for review next week for follow-up  3-3 of A. fib this appears rate controlled on amiodarone and Lopressor onXarelto for prophylaxis.  #4 history of chronic kidney disease-creatinine on actually appears improved from hospital at 1.5 BUN of 22 today on--- discharge from hospital BUN was 67 creatinine 2.37-again we will be updating lab next week  [id number]      Shamekia Tippets C,

## 2015-08-14 ENCOUNTER — Encounter: Payer: Self-pay | Admitting: Vascular Surgery

## 2015-08-19 ENCOUNTER — Encounter: Payer: Self-pay | Admitting: Vascular Surgery

## 2015-08-19 ENCOUNTER — Ambulatory Visit (INDEPENDENT_AMBULATORY_CARE_PROVIDER_SITE_OTHER): Payer: Medicare Other | Admitting: Vascular Surgery

## 2015-08-19 VITALS — BP 121/77 | HR 69 | Temp 97.0°F | Resp 18 | Ht 70.0 in | Wt 284.0 lb

## 2015-08-19 DIAGNOSIS — I70239 Atherosclerosis of native arteries of right leg with ulceration of unspecified site: Secondary | ICD-10-CM | POA: Diagnosis not present

## 2015-08-19 DIAGNOSIS — I70249 Atherosclerosis of native arteries of left leg with ulceration of unspecified site: Secondary | ICD-10-CM

## 2015-08-19 DIAGNOSIS — I739 Peripheral vascular disease, unspecified: Secondary | ICD-10-CM

## 2015-08-20 NOTE — Progress Notes (Signed)
[facility] [place] HISTORY AND PHYSICAL   Referring Physician: Dr Netta Neat History of Present Illness:  Patient is a 80 y.o. male who presents for evaluation of intermittent recurrent ulcers of his lower extremities. The patient has chronic leg swelling. He occasionally develops ulcers in the pretibial region. These have healed spontaneously in the past. They currently are almost healed. The patient is minimally ambulatory and does transfer with a walker. He currently resides at a skilled nursing facility due to deconditioning after recent hospitalization. At the time of his hospitalization he did have some renal dysfunction but his creatinine has essentially return to normal at this point.  Other medical problems include diabetes, obesity, hypertension, coronary artery disease, atrial fibrillation, elevated cholesterol, COPD and CHF all of which are currently stable. He is on Xarelto for his atrial fibrillation.  Past Medical History  Diagnosis Date  . Type II or unspecified type diabetes mellitus without mention of complication, not stated as uncontrolled   . Obesity, unspecified   . Unspecified essential hypertension   . Coronary atherosclerosis of unspecified type of vessel, native or graft   . Vestibulopathy   . Hypokalemia   . Atrial fibrillation ([place])   . Rhabdomyolysis   . Hypercholesteremia   . COPD (chronic obstructive pulmonary disease) ([place])   . OSA (obstructive sleep apnea)   . Anteroseptal myocardial infarction ([place])   . Obesity hypoventilation syndrome ([place])   . Hypertensive renal disease   . Chronic kidney disease   . CHF (congestive heart failure) ([place])   . Diverticulosis   . Glaucoma     Past Surgical History  Procedure Laterality Date  . Inguinal hernia repair Right 1985  . Coronary artery bypass graft  1997    PCI to LAD  . Nose surgery  1970s    Per Dr. Terrence Dupont [facility] in pt chart  . Knee arthroscopy Left 1991  . Lumbar fusion    .  Appendectomy  1985  . Cholecystectomy  06/2000    Social History Social History  Substance Use Topics  . Smoking status: Never Smoker   . Smokeless tobacco: Never Used  . Alcohol Use: No    Family History Family History  Problem Relation Age of Onset  . COPD Mother   . Diabetes Father   . Diabetes Sister   . Heart disease Brother   . Breast cancer Maternal Aunt   . Diabetes Paternal Grandmother   . Colon cancer Maternal Aunt   . Ovarian cancer Daughter     Allergies  No Known Allergies   Current Outpatient Prescriptions  Medication Sig Dispense Refill  . atorvastatin (LIPITOR) 20 MG tablet Take 20 mg by mouth daily at 6 PM.     . cholecalciferol (VITAMIN D) 1000 UNITS tablet Take 1,000 Units by mouth daily.    . ciprofloxacin (CIPRO) 250 MG tablet Take 250 mg by mouth 2 (two) times daily.    . furosemide (LASIX) 40 MG tablet Take 40 mg by mouth.    Marland Kitchen HYDROcodone-acetaminophen (NORCO) 5-325 MG tablet Take 1-2 tablets by mouth every 6 (six) hours as needed for moderate pain or severe pain. 30 tablet 0  . Lactobacillus (ACIDOPHILUS PROBIOTIC) 10 MG TABS Take 10 mg by mouth 3 (three) times daily. (Patient taking differently: Take 10 mg by mouth at bedtime. ) 90 tablet 0  . latanoprost (XALATAN) 0.005 % ophthalmic solution Place 1 drop into both eyes at bedtime.    . Magnesium 400 MG TABS Take  400 mg by mouth daily.    . metoprolol tartrate (LOPRESSOR) 25 MG tablet Take 1 tablet (25 mg total) by mouth 2 (two) times daily. 60 tablet 3  . nitroGLYCERIN (NITROSTAT) 0.4 MG SL tablet Place 0.4 mg under the tongue every 5 (five) minutes as needed for chest pain.    . potassium chloride SA (K-DUR,KLOR-CON) 20 MEQ tablet Take 40 mEq by mouth daily.    . Saccharomyces boulardii (PROBIOTIC) 250 MG CAPS Take 250 mg by mouth 2 (two) times daily.    . saxagliptin HCl (ONGLYZA) 5 MG TABS tablet Take 5 mg by mouth daily.    Alveda Reasons 15 MG TABS tablet Take 15 mg by mouth daily with supper.      Marland Kitchen amiodarone (PACERONE) 200 MG tablet Take 200 mg by mouth at bedtime.     . collagenase (SANTYL) ointment Apply topically daily. 15 g 0  . tamsulosin (FLOMAX) 0.4 MG CAPS capsule Take 1 capsule (0.4 mg total) by mouth daily. 30 capsule 3   No current facility-administered medications for this visit.    ROS:   General:  No weight loss, Fever, chills  HEENT: No recent headaches, no nasal bleeding, no visual changes, no sore throat  Neurologic: No dizziness, blackouts, seizures. No recent symptoms of stroke or mini- stroke. No recent episodes of slurred speech, or temporary blindness.  Cardiac: No recent episodes of chest pain/pressure, no shortness of breath at rest.  + shortness of breath with exertion. + history of atrial fibrillation or irregular heartbeat  Vascular: No history of rest pain in feet.  No history of claudication.  + history of non-healing ulcer, No history of DVT   Pulmonary: No home oxygen, no productive cough, no hemoptysis,  No asthma or wheezing  Musculoskeletal:  [ ]  Arthritis, [ ]  Low back pain,  [x ] Joint pain  Hematologic:No history of hypercoagulable state.  No history of easy bleeding.  No history of anemia  Gastrointestinal: No hematochezia or melena,  No gastroesophageal reflux, no trouble swallowing  Urinary: [ x] chronic Kidney disease, [ ]  on HD - [ ]  MWF or [ ]  TTHS, [ ]  Burning with urination, [ ]  Frequent urination, [ ]  Difficulty urinating;   Skin: No rashes  Psychological: No history of anxiety,  No history of depression   Physical Examination  Filed Vitals:   08/19/15 1359  BP: 121/77  Pulse: 69  Temp: 97 F (36.1 C)  Resp: 18  Height: 5\' 10"  (1.778 m)  Weight: 284 lb (128.822 kg)  SpO2: 94%    Body mass index is 40.75 kg/(m^2).  General:  Alert and oriented, no acute distress HEENT: Normal Neck: No bruit or JVD Pulmonary: Clear to auscultation bilaterally Cardiac: Regular Rate and Rhythm without murmur Abdomen: Soft,  non-tender, non-distended, no mass, no scars Skin: No rash Extremity Pulses:  2+ radial, brachial, unable to palpate femoral due to the patent patient being in a wheelchair and could not be moved to a table absent dorsalis pedis, posterior tibial pulses bilaterally Musculoskeletal: No deformity 1+ edema bilaterally  Neurologic: Upper and lower extremity motor 5/5 and symmetric  DATA:  Patient had a duplex ultrasound of his lower extremity arterial tree on 07/31/2015 which suggested aortoiliac occlusive disease as well as left popliteal artery occlusive disease. ABI on the right was 0.95 left was 0.93 but vessels were calcified suggesting artificial increase. I also reviewed previous images on a CT scan which showed some calcification of the aortic iliacs but  this was not dedicated to look at the arterial tree. I also reviewed a report from a recent CT scan at [facility] urology which again suggested aortoiliac calcification but was not a dedicated arterial study.   ASSESSMENT:  Most likely patient has mixed pathology with element of venous hypertension from his obesity and congestive failure as well as some element of arterial occlusive disease. Currently his wounds are healed. I believe the mainstay of therapy for him is going to be ulcer prevention through compression therapy. He is currently receiving this. Since he has had renal dysfunction in the recent past with creatinine as high as 2 and recently had an additional CT scan of [facility] I believe that we should wait several weeks to allow his kidneys to recover from this. He will follow-up with me in 1 month's time with a CT angiogram abdomen and pelvis with runoff.   PLAN:  See above  Ruta Hinds, MD [facility] [place] Office: [phone] Pager: [phone]

## 2015-08-24 ENCOUNTER — Encounter: Payer: Self-pay | Admitting: Adult Health

## 2015-08-25 DIAGNOSIS — S81801D Unspecified open wound, right lower leg, subsequent encounter: Secondary | ICD-10-CM | POA: Diagnosis not present

## 2015-08-25 DIAGNOSIS — L988 Other specified disorders of the skin and subcutaneous tissue: Secondary | ICD-10-CM | POA: Diagnosis not present

## 2015-08-25 DIAGNOSIS — L89623 Pressure ulcer of left heel, stage 3: Secondary | ICD-10-CM | POA: Diagnosis not present

## 2015-08-31 ENCOUNTER — Encounter: Payer: Self-pay | Admitting: Internal Medicine

## 2015-08-31 ENCOUNTER — Non-Acute Institutional Stay (SKILLED_NURSING_FACILITY): Payer: Medicare Other | Admitting: Internal Medicine

## 2015-08-31 DIAGNOSIS — I10 Essential (primary) hypertension: Secondary | ICD-10-CM | POA: Diagnosis not present

## 2015-08-31 DIAGNOSIS — I482 Chronic atrial fibrillation, unspecified: Secondary | ICD-10-CM

## 2015-08-31 DIAGNOSIS — N184 Chronic kidney disease, stage 4 (severe): Secondary | ICD-10-CM

## 2015-08-31 DIAGNOSIS — E876 Hypokalemia: Secondary | ICD-10-CM | POA: Diagnosis not present

## 2015-08-31 NOTE — Progress Notes (Signed)
Patient ID: Reginald [name], male   DOB: [birth year], 80 y.o.   MRN: [id number]  MRN: [id number] Name: WYLLIAM HANEK  Sex: male Age: 80 y.o. DOB: [birth year]  [place] [facility]: [facility] Facility/Room: Level Of Care: SNF Provider: Wille Celeste Emergency Contacts: Extended Emergency Contact Information Primary Emergency Contact: [name] Address: [place] Phone: [phone] Relation: Friend  Code Status:   Allergies: Review of patient's allergies indicates no known allergies.  Chief Complaint  Patient presents with  . Acute Visit    Acute visit secondary to hypokalemia-follow-up renal insufficiency-follow-up hypertension-  HPI: Patient is 80 y.o. male who was admitted to SNF after being hospitalized for leg hematoma and cellulitis who is being seen for hypokalemia with history of chronic renal disease Routine lab on February 1 showed a low potassium of 3.3 with a BUN of 14 creatinine 1.4. Patient was on 30 mEq of potassium he did get a neck should also 40 mEq and his potassium was increased routinely up to 40 mEq a day.  Potassium fairly quickly normalized in fact it was 3.6 on February 7 and updated one on February 10 showed had risen to 4.0.  However updated lab on February 16 shows it has dropped again down to 3.3.--Recent lab also showed magnesium was slightly low at 1.7 this is being supplemented as well  Renal function appears to be relatively baseline with most recent creatinine 1.7 and BUN of 31 on lab done on February 16.  Patient also has a history of hypertension for so some concern for elevated blood pressure readings however it appears blood pressures taken with a larger cuff which he requires appear to be quite satisfactory 140/70 today-I see previous ones 132/77-120/54 126/72-I do see one listed 170/83 but I suspect this was taken with a smaller cuff.  He does have a history of atrial fibrillation this appears rate  controlled currently pulses appear to be in the 60s and 70s-he is on Xarelto as well as Lopressor for rate control.  Currently has no acute complaints today does not complain of any shortness of breath or chest pain  --    Past Medical History  Diagnosis Date  . Type II or unspecified type diabetes mellitus without mention of complication, not stated as uncontrolled   . Obesity, unspecified   . Unspecified essential hypertension   . Coronary atherosclerosis of unspecified type of vessel, native or graft   . Vestibulopathy   . Hypokalemia   . Atrial fibrillation ([place])   . Rhabdomyolysis   . Hypercholesteremia   . COPD (chronic obstructive pulmonary disease) ([place])   . OSA (obstructive sleep apnea)   . Anteroseptal myocardial infarction ([place])   . Obesity hypoventilation syndrome ([place])   . Hypertensive renal disease   . Chronic kidney disease   . CHF (congestive heart failure) ([place])   . Diverticulosis   . Glaucoma     Past Surgical History  Procedure Laterality Date  . Inguinal hernia repair Right 1985  . Coronary artery bypass graft  1997    PCI to LAD  . Nose surgery  1970s    Per Dr. Terrence Dupont [facility] in pt chart  . Knee arthroscopy Left 1991  . Lumbar fusion    . Appendectomy  1985  . Cholecystectomy  06/2000      Medication List       This list is accurate as of: 08/31/15  1:43  PM.  Always use your most recent med list.               ACIDOPHILUS PROBIOTIC 10 MG Tabs  Take 10 mg by mouth 3 (three) times daily.     amiodarone 200 MG tablet  Commonly known as:  PACERONE  Take 200 mg by mouth at bedtime.     atorvastatin 20 MG tablet  Commonly known as:  LIPITOR  Take 20 mg by mouth daily at 6 PM.     cholecalciferol 1000 units tablet  Commonly known as:  VITAMIN D  Take 1,000 Units by mouth daily.     collagenase ointment  Commonly known as:  SANTYL  Apply topically daily.     furosemide 40 MG tablet  Commonly known as:  LASIX  Take 40 mg by mouth.      HYDROcodone-acetaminophen 5-325 MG tablet  Commonly known as:  NORCO  Take 1-2 tablets by mouth every 6 (six) hours as needed for moderate pain or severe pain.     latanoprost 0.005 % ophthalmic solution  Commonly known as:  XALATAN  Place 1 drop into both eyes at bedtime.     Magnesium 400 MG Tabs  Take 400 mg by mouth daily.     metoprolol tartrate 25 MG tablet  Commonly known as:  LOPRESSOR  Take 1 tablet (25 mg total) by mouth 2 (two) times daily.     nitroGLYCERIN 0.4 MG SL tablet  Commonly known as:  NITROSTAT  Place 0.4 mg under the tongue every 5 (five) minutes as needed for chest pain.     potassium chloride SA 20 MEQ tablet  Commonly known as:  K-DUR,KLOR-CON  Take 40 mEq by mouth daily.     Probiotic 250 MG Caps  Take 250 mg by mouth 2 (two) times daily.     saxagliptin HCl 5 MG Tabs tablet  Commonly known as:  ONGLYZA  Take 5 mg by mouth daily.     tamsulosin 0.4 MG Caps capsule  Commonly known as:  FLOMAX  Take 1 capsule (0.4 mg total) by mouth daily.     XARELTO 15 MG Tabs tablet  Generic drug:  Rivaroxaban  Take 15 mg by mouth daily with supper.            Social History  Substance Use Topics  . Smoking status: Never Smoker   . Smokeless tobacco: Never Used  . Alcohol Use: No    Review of Systems  DATA OBTAINED: from patient, nurse GENERAL:  no fevers, fatigue, appetite changes SKIN: No itching, rash--has completed treatment for lower extremity cellulitis on the left HEENT: No complaint RESPIRATORY: No cough, wheezing, SOB CARDIAC: No chest pain, palpitations, baseline lower extremity venous stasis changes-- edema  GI: No abdominal pain, No N/V/D or constipation, No heartburn or reflux  GU: No dysuria, frequency or urgency, or incontinence  MUSCULOSKELETAL: No unrelieved bone/joint pain NEUROLOGIC: No headache, dizziness  PSYCHIATRIC: No overt anxiety or sadness  Filed Vitals:   08/31/15 1338  BP: 140/70  Pulse: 72  Temp: 96.2 F  (35.7 C)  Resp: 20    Physical Exam  GENERAL APPEARANCE: Alert, conversant, No acute distress  SKIN:   left lower extremity wound is dressed this followed by wound care  head ears nose mouth and throat-oropharynx is clear visual acuity appears grossly intact  RESPIRATORY: Breathing is even, unlabored. Lung sounds are clear   CARDIOVASCULAR: irregular irregular rate and rhythm without murmur gallop or rub Venous  stasis changes/ 2+ lower extremity peripheral edema  GASTROINTESTINAL: Abdomen is soft, non-tender, obese w/ normal bowel sounds.   MUSCULOSKELETAL: No abnormal joints or musculature --ambulating in wheelchair  NEUROLOGIC: Cranial nerves 2-12 grossly intact. Moves all extremities PSYCHIATRIC: Mood and affect appropriate to situation, no behavioral issues  Patient Active Problem List   Diagnosis Date Noted  . Hypokalemia 08/13/2015  . HTN (hypertension) 08/13/2015  . Multiple open wounds of lower leg 07/22/2015  . CKD (chronic kidney disease) stage 4, GFR 15-29 ml/min (HCC) 07/22/2015  . Cellulitis of leg, right 06/28/2015  . ARF (acute renal failure) ([place]) 06/28/2015  . Hypertensive heart disease with CHF (congestive heart failure) ([place]) 05/19/2015  . CAD (coronary artery disease), native coronary artery 05/19/2015  . Hyperlipidemia 05/19/2015  . Vitamin D deficiency 05/19/2015  . Pressure ulcer 05/16/2015  . Diastolic dysfunction with chronic heart failure ([place]) 05/08/2015  . Blisters of multiple sites 05/08/2015  . Type 2 diabetes mellitus ([place])   . Obesity, unspecified   . OSA (obstructive sleep apnea)   . Atrial fibrillation ([place])   . Secondary DM with CKD stage 4 and hypertension ([place])   . Glaucoma   . Cellulitis 05/07/2015   Labs  08/27/2015.  Sodium 142 potassium 3.3 BUN 31 creatinine 1.7-CO2 level is 30.  I do note on February 10 potassium was 4.0 and on February 7 was 3.6 it was as low as 3.2 on February 2 .  08/13/2015.  Sodium 144 potassium 3.2  BUN 22 creatinine 1.5 CO2 30.  Magnesium 1.7.  08/12/2015.  Sodium 143 potassium 3.3 BUN 15 creatinine 1.4.  Creatinine previously was 1.8 on January 30.  WBC 10.7 hemoglobin 14.2 platelets 292.  08/09/1998 1017.  Sodium 144 potassium 3.3 BUN 22 creatinine 1.8.  Albumin 3.1 otherwise liver function tests within normal limits.  TSH 3.8.  Hemoglobin A1c 5.6 CBC    Component Value Date/Time   WBC 14.6* 05/18/2015 0844   RBC 4.19* 05/18/2015 0844   HGB 11.9* 05/18/2015 0844   HCT 37.2* 05/18/2015 0844   PLT 454* 05/18/2015 0844   MCV 88.8 05/18/2015 0844   LYMPHSABS 1.4 05/07/2015 2237   MONOABS 1.2* 05/07/2015 2237   EOSABS 0.2 05/07/2015 2237   BASOSABS 0.0 05/07/2015 2237    CMP     Component Value Date/Time   NA 144 05/18/2015 0844   K 4.3 05/18/2015 0844   CL 107 05/18/2015 0844   CO2 25 05/18/2015 0844   GLUCOSE 170* 05/18/2015 0844   BUN 53* 05/18/2015 0844   CREATININE 1.99* 05/18/2015 0844   CALCIUM 8.9 05/18/2015 0844   PROT 5.5* 05/08/2015 0435   ALBUMIN 2.9* 05/08/2015 0435   AST 18 05/08/2015 0435   ALT 21 05/08/2015 0435   ALKPHOS 58 05/08/2015 0435   BILITOT 1.0 05/08/2015 0435   GFRNONAA 31* 05/18/2015 0844   GFRAA 36* 05/18/2015 0844    Assessment and Plan  #1 history of hypokalemia Potassium appears to be trending down-although it had stabilized previously on the 40 mEq a day-will give an extra 40 mEq today and then increased routine potassium up to 60 mEq a day-will check a BMP tomorrow for follow-up and also 1 next week to ensure stability again there is variability here-magnesium is being supplemented as well. Is 140/70 today previous ones 132/77 120/54 at this point monitor   #2 hypertension he continues on Lopressor- This appears to be stable as noted above when the larger cuff issues blood pressures appear to be satisfactory  3-3 of A. fib this appears rate controlled on  Lopressorand onXarelto for  DVT prophylaxis.  #4 history  of chronic kidney disease This appears relatively baseline with a creatinine of 1.7 BUN of 31 on lab done February 16 again we will update this this week-and hospital creatinine was actually over 2.    [id number]      Jeannemarie Sawaya C,

## 2015-09-01 DIAGNOSIS — L988 Other specified disorders of the skin and subcutaneous tissue: Secondary | ICD-10-CM | POA: Diagnosis not present

## 2015-09-01 DIAGNOSIS — S81801D Unspecified open wound, right lower leg, subsequent encounter: Secondary | ICD-10-CM | POA: Diagnosis not present

## 2015-09-01 DIAGNOSIS — L89623 Pressure ulcer of left heel, stage 3: Secondary | ICD-10-CM | POA: Diagnosis not present

## 2015-09-02 DIAGNOSIS — E612 Magnesium deficiency: Secondary | ICD-10-CM | POA: Diagnosis not present

## 2015-09-02 DIAGNOSIS — I10 Essential (primary) hypertension: Secondary | ICD-10-CM | POA: Diagnosis not present

## 2015-09-07 DIAGNOSIS — I10 Essential (primary) hypertension: Secondary | ICD-10-CM | POA: Diagnosis not present

## 2015-09-08 DIAGNOSIS — L988 Other specified disorders of the skin and subcutaneous tissue: Secondary | ICD-10-CM | POA: Diagnosis not present

## 2015-09-08 DIAGNOSIS — L89623 Pressure ulcer of left heel, stage 3: Secondary | ICD-10-CM | POA: Diagnosis not present

## 2015-09-08 DIAGNOSIS — S81801D Unspecified open wound, right lower leg, subsequent encounter: Secondary | ICD-10-CM | POA: Diagnosis not present

## 2015-09-11 DIAGNOSIS — I10 Essential (primary) hypertension: Secondary | ICD-10-CM | POA: Diagnosis not present

## 2015-09-11 DIAGNOSIS — E559 Vitamin D deficiency, unspecified: Secondary | ICD-10-CM | POA: Diagnosis not present

## 2015-09-15 DIAGNOSIS — I70238 Atherosclerosis of native arteries of right leg with ulceration of other part of lower right leg: Secondary | ICD-10-CM | POA: Diagnosis not present

## 2015-09-16 ENCOUNTER — Other Ambulatory Visit: Payer: Self-pay | Admitting: *Deleted

## 2015-09-18 ENCOUNTER — Ambulatory Visit
Admission: RE | Admit: 2015-09-18 | Discharge: 2015-09-18 | Disposition: A | Discharge status: Home / Self Care | Payer: Medicare Other | Source: Ambulatory Visit | Attending: Vascular Surgery | Admitting: Vascular Surgery

## 2015-09-18 DIAGNOSIS — N3941 Urge incontinence: Secondary | ICD-10-CM | POA: Diagnosis not present

## 2015-09-18 DIAGNOSIS — R310 Gross hematuria: Secondary | ICD-10-CM | POA: Diagnosis not present

## 2015-09-18 DIAGNOSIS — I70249 Atherosclerosis of native arteries of left leg with ulceration of unspecified site: Principal | ICD-10-CM

## 2015-09-18 DIAGNOSIS — Z0000 Encounter for general adult medical examination without abnormal findings: Secondary | ICD-10-CM | POA: Diagnosis not present

## 2015-09-18 DIAGNOSIS — I70239 Atherosclerosis of native arteries of right leg with ulceration of unspecified site: Secondary | ICD-10-CM

## 2015-09-18 DIAGNOSIS — I743 Embolism and thrombosis of arteries of the lower extremities: Secondary | ICD-10-CM | POA: Diagnosis not present

## 2015-09-18 DIAGNOSIS — R350 Frequency of micturition: Secondary | ICD-10-CM | POA: Diagnosis not present

## 2015-09-18 MED ORDER — IOPAMIDOL (ISOVUE-370) INJECTION 76%
75.0000 mL | Freq: Once | INTRAVENOUS | Status: AC | PRN
  Administered 2015-09-18: 75 mL via INTRAVENOUS

## 2015-09-21 ENCOUNTER — Non-Acute Institutional Stay (SKILLED_NURSING_FACILITY): Payer: Medicare Other | Admitting: Internal Medicine

## 2015-09-21 ENCOUNTER — Encounter: Payer: Self-pay | Admitting: Internal Medicine

## 2015-09-21 DIAGNOSIS — R319 Hematuria, unspecified: Secondary | ICD-10-CM | POA: Diagnosis not present

## 2015-09-21 DIAGNOSIS — I4891 Unspecified atrial fibrillation: Secondary | ICD-10-CM | POA: Diagnosis not present

## 2015-09-21 DIAGNOSIS — N184 Chronic kidney disease, stage 4 (severe): Secondary | ICD-10-CM

## 2015-09-21 DIAGNOSIS — I10 Essential (primary) hypertension: Secondary | ICD-10-CM

## 2015-09-21 NOTE — Progress Notes (Signed)
Patient ID: Reginald [name], male   DOB: 08/26/1935, 80 y.o.   MRN: [id number]   MRN: [id number] Name: Reginald [name]  Sex: male Age: 80 y.o. DOB: 1936/01/28  [place] [facility]: [facility] Facility/Room: Level Of Care: SNF Provider: Wille Celeste Emergency Contacts: Extended Emergency Contact Information Primary Emergency Contact: [name] Address: [place] Phone: [phone] Relation: Friend  Code Status:   Allergies: Review of patient's allergies indicates no known allergies.  Chief Complaint  Patient presents with  . Acute Visit    Acute visit secondary to hematuria-follow-up renal insufficiency  HPI: Patient is 80 y.o. male who was admitted to SNF after being hospitalized for leg hematoma and cellulitis who is being seen for hematuria. Renal insufficiency   Patient does have a history renal insufficiency with baseline creatinine around 1.7-recent lab on February 28 showed have risen slightly up to 1.92 with a BUN of 31 we will update this-apparently he is eating and drinking fairly well.  He also has a history of low potassium this is being supplemented T is currently on 60 mEq a day most recent potassium was 4.2 on the February 28 lab.  Patient is followed closely by urology and recently had a cystoscopy done did not show any acute abnormalities-these had some bleeding from his urethra apparently fairly extensive yesterday-it has slowed down considerably today and actually appears to have largely stopped.  Patient was advised that he would have some bleeding after the procedure. Of note he is on Xarelto for anticoagulation with history of A. fib  Currently vital signs are stable he does occasionally have elevated systolics in the [id number] but this does not appear to be persistent.  He also has a history of atrial fibrillation which appears to be rate controlled he is on Lopressor for rate control is on Xarelto for  anticoagulation    --    Past Medical History  Diagnosis Date  . Type II or unspecified type diabetes mellitus without mention of complication, not stated as uncontrolled   . Obesity, unspecified   . Unspecified essential hypertension   . Coronary atherosclerosis of unspecified type of vessel, native or graft   . Vestibulopathy   . Hypokalemia   . Atrial fibrillation ([place])   . Rhabdomyolysis   . Hypercholesteremia   . COPD (chronic obstructive pulmonary disease) ([place])   . OSA (obstructive sleep apnea)   . Anteroseptal myocardial infarction ([place])   . Obesity hypoventilation syndrome ([place])   . Hypertensive renal disease   . Chronic kidney disease   . CHF (congestive heart failure) ([place])   . Diverticulosis   . Glaucoma     Past Surgical History  Procedure Laterality Date  . Inguinal hernia repair Right 1985  . Coronary artery bypass graft  1997    PCI to LAD  . Nose surgery  1970s    Per Dr. Terrence Dupont [facility] in pt chart  . Knee arthroscopy Left 1991  . Lumbar fusion    . Appendectomy  1985  . Cholecystectomy  06/2000      Medication List       This list is accurate as of: 09/21/15 11:59 PM.  Always use your most recent med list.               ACIDOPHILUS PROBIOTIC 10 MG Tabs  Take 10 mg by mouth 3 (three) times daily.     atorvastatin 20  MG tablet  Commonly known as:  LIPITOR  Take 20 mg by mouth daily at 6 PM.     cholecalciferol 1000 units tablet  Commonly known as:  VITAMIN D  Take 1,000 Units by mouth daily.     collagenase ointment  Commonly known as:  SANTYL  Apply topically daily.     furosemide 40 MG tablet  Commonly known as:  LASIX  Take 40 mg by mouth.     HYDROcodone-acetaminophen 5-325 MG tablet  Commonly known as:  NORCO  Take 1-2 tablets by mouth every 6 (six) hours as needed for moderate pain or severe pain.     latanoprost 0.005 % ophthalmic solution  Commonly known as:  XALATAN  Place 1 drop into both eyes at bedtime.      Magnesium 400 MG Tabs  Take 400 mg by mouth daily.     metoprolol tartrate 25 MG tablet  Commonly known as:  LOPRESSOR  Take 1 tablet (25 mg total) by mouth 2 (two) times daily.     nitroGLYCERIN 0.4 MG SL tablet  Commonly known as:  NITROSTAT  Place 0.4 mg under the tongue every 5 (five) minutes as needed for chest pain.     potassium chloride SA 20 MEQ tablet  Commonly known as:  K-DUR,KLOR-CON  Take 60 mEq by mouth daily.     Probiotic 250 MG Caps  Take 250 mg by mouth 2 (two) times daily.     saxagliptin HCl 5 MG Tabs tablet  Commonly known as:  ONGLYZA  Take 5 mg by mouth daily.     tamsulosin 0.4 MG Caps capsule  Commonly known as:  FLOMAX  Take 1 capsule (0.4 mg total) by mouth daily.     XARELTO 15 MG Tabs tablet  Generic drug:  Rivaroxaban  Take 15 mg by mouth daily with supper.            Social History  Substance Use Topics  . Smoking status: Never Smoker   . Smokeless tobacco: Never Used  . Alcohol Use: No    Review of Systems  DATA OBTAINED: from patient, nurse GENERAL:  no fevers, fatigue, appetite changes SKIN: No itching, rash--has completed treatment for lower extremity cellulitis on the left HEENT: No complaint RESPIRATORY: No cough, wheezing, SOB CARDIAC: No chest pain, palpitations, baseline lower extremity venous stasis changes-- edema  GI: No abdominal pain, No N/V/D or constipation, No heartburn or reflux  GU: No dysuria, frequency or urgency, or incontinence has had hematuria as noted above  MUSCULOSKELETAL: No unrelieved bone/joint pain NEUROLOGIC: No headache, dizziness  PSYCHIATRIC: No overt anxiety or sadness  Filed Vitals:   09/21/15 2128  Pulse: 68  Temp: 97.4 F (36.3 C)  Resp: 18    Physical Exam  GENERAL APPEARANCE: Alert, conversant, No acute distress      RESPIRATORY: Breathing is even, unlabored. Lung sounds are clear   CARDIOVASCULAR: irregular irregular rate and rhythm without murmur gallop or rub Venous  stasis changes/ 2+ lower extremity peripheral edema  GASTROINTESTINAL: Abdomen is soft, non-tender, obese w/ normal bowel sounds.   MUSCULOSKELETAL: No abnormal joints or musculature --ambulating in wheelchair  NEUROLOGIC: Cranial nerves 2-12 grossly intact. Moves all extremities PSYCHIATRIC: Mood and affect appropriate to situation, no behavioral issues  Patient Active Problem List   Diagnosis Date Noted  . Hematuria 09/21/2015  . Hypokalemia 08/13/2015  . HTN (hypertension) 08/13/2015  . Multiple open wounds of lower leg 07/22/2015  . CKD (chronic kidney disease) stage  4, GFR 15-29 ml/min (HCC) 07/22/2015  . Cellulitis of leg, right 06/28/2015  . ARF (acute renal failure) ([place]) 06/28/2015  . Hypertensive heart disease with CHF (congestive heart failure) ([place]) 05/19/2015  . CAD (coronary artery disease), native coronary artery 05/19/2015  . Hyperlipidemia 05/19/2015  . Vitamin D deficiency 05/19/2015  . Pressure ulcer 05/16/2015  . Diastolic dysfunction with chronic heart failure ([place]) 05/08/2015  . Blisters of multiple sites 05/08/2015  . Type 2 diabetes mellitus ([place])   . Obesity, unspecified   . OSA (obstructive sleep apnea)   . Atrial fibrillation ([place])   . Secondary DM with CKD stage 4 and hypertension ([place])   . Glaucoma   . Cellulitis 05/07/2015   Labs  09/08/2015.  Sodium 142 potassium 4.2 BUN 31 creatinine 1.9 to.  09/03/2015.  WBC 9.1 hemoglobin 13.3 platelets 349.  Sodium 140 potassium 4 BUN 29 creatinine 1.72.    08/27/2015.  Sodium 142 potassium 3.3 BUN 31 creatinine 1.7-CO2 level is 30.  I do note on February 10 potassium was 4.0 and on February 7 was 3.6 it was as low as 3.2 on February 2 .  08/13/2015.  Sodium 144 potassium 3.2 BUN 22 creatinine 1.5 CO2 30.  Magnesium 1.7.  08/12/2015.  Sodium 143 potassium 3.3 BUN 15 creatinine 1.4.  Creatinine previously was 1.8 on January 30.  WBC 10.7 hemoglobin 14.2 platelets 292.  08/09/1998  1017.  Sodium 144 potassium 3.3 BUN 22 creatinine 1.8.  Albumin 3.1 otherwise liver function tests within normal limits.  TSH 3.8.  Hemoglobin A1c 5.6 CBC    Component Value Date/Time   WBC 14.6* 05/18/2015 0844   RBC 4.19* 05/18/2015 0844   HGB 11.9* 05/18/2015 0844   HCT 37.2* 05/18/2015 0844   PLT 454* 05/18/2015 0844   MCV 88.8 05/18/2015 0844   LYMPHSABS 1.4 05/07/2015 2237   MONOABS 1.2* 05/07/2015 2237   EOSABS 0.2 05/07/2015 2237   BASOSABS 0.0 05/07/2015 2237    CMP     Component Value Date/Time   NA 144 05/18/2015 0844   K 4.3 05/18/2015 0844   CL 107 05/18/2015 0844   CO2 25 05/18/2015 0844   GLUCOSE 170* 05/18/2015 0844   BUN 53* 05/18/2015 0844   CREATININE 1.99* 05/18/2015 0844   CALCIUM 8.9 05/18/2015 0844   PROT 5.5* 05/08/2015 0435   ALBUMIN 2.9* 05/08/2015 0435   AST 18 05/08/2015 0435   ALT 21 05/08/2015 0435   ALKPHOS 58 05/08/2015 0435   BILITOT 1.0 05/08/2015 0435   GFRNONAA 31* 05/18/2015 0844   GFRAA 36* 05/18/2015 0844    Assessment and Plan  #1  Hematuria-I suspect this is secondary to the cystoscopy procedure-clinically appears stable this appears to have largely subsided-will update blood work including a CBC tomorrow to make sure that has not been acute blood loss THOUGH I think this is fairly unlikely.    #2 hypertension he continues on Lopressor- This appears to be stable as noted above when the larger cuff issues blood pressures appear to be satisfactory --suspect there will continue be some variability  3-3 of A. fib this appears rate controlled on  Lopressorand onXarelto for  DVT prophylaxis.  #4 history of chronic kidney disease  Creatinine is up slightly at 1.92 with a BUN of 31-we'll update this appears he is on the higher end of his baseline -- actually in hospital creatinine was 2 or above  [id number]    [id number]      Juron Vorhees C,

## 2015-09-22 DIAGNOSIS — I129 Hypertensive chronic kidney disease with stage 1 through stage 4 chronic kidney disease, or unspecified chronic kidney disease: Secondary | ICD-10-CM | POA: Diagnosis not present

## 2015-09-22 DIAGNOSIS — I70238 Atherosclerosis of native arteries of right leg with ulceration of other part of lower right leg: Secondary | ICD-10-CM | POA: Diagnosis not present

## 2015-09-29 DIAGNOSIS — I129 Hypertensive chronic kidney disease with stage 1 through stage 4 chronic kidney disease, or unspecified chronic kidney disease: Secondary | ICD-10-CM | POA: Diagnosis not present

## 2015-09-29 DIAGNOSIS — I70238 Atherosclerosis of native arteries of right leg with ulceration of other part of lower right leg: Secondary | ICD-10-CM | POA: Diagnosis not present

## 2015-10-01 ENCOUNTER — Ambulatory Visit: Payer: Medicare Other | Admitting: Vascular Surgery

## 2015-10-06 ENCOUNTER — Encounter: Payer: Self-pay | Admitting: Vascular Surgery

## 2015-10-06 DIAGNOSIS — I70238 Atherosclerosis of native arteries of right leg with ulceration of other part of lower right leg: Secondary | ICD-10-CM | POA: Diagnosis not present

## 2015-10-08 ENCOUNTER — Non-Acute Institutional Stay (SKILLED_NURSING_FACILITY): Payer: Medicare Other | Admitting: Internal Medicine

## 2015-10-08 ENCOUNTER — Ambulatory Visit: Payer: Medicare Other | Admitting: Vascular Surgery

## 2015-10-08 DIAGNOSIS — N184 Chronic kidney disease, stage 4 (severe): Secondary | ICD-10-CM | POA: Diagnosis not present

## 2015-10-08 DIAGNOSIS — R531 Weakness: Secondary | ICD-10-CM

## 2015-10-08 DIAGNOSIS — I4891 Unspecified atrial fibrillation: Secondary | ICD-10-CM

## 2015-10-08 DIAGNOSIS — R059 Cough, unspecified: Secondary | ICD-10-CM

## 2015-10-08 DIAGNOSIS — R0989 Other specified symptoms and signs involving the circulatory and respiratory systems: Secondary | ICD-10-CM | POA: Diagnosis not present

## 2015-10-08 DIAGNOSIS — R05 Cough: Secondary | ICD-10-CM | POA: Diagnosis not present

## 2015-10-08 NOTE — Progress Notes (Signed)
Patient ID: Reginald [name], male   DOB: [birth year], 80 y.o.   MRN: [id number]    MRN: [id number] Name: Reginald [name]  Sex: male Age: 80 y.o. DOB: Jan 06, 1936  [facility]: [facility] Facility/Room: Level Of Care: SNF Provider: Wille Celeste Emergency Contacts: Extended Emergency Contact Information Primary Emergency Contact: Reginald [name] Address: [address] [place] Phone: [phone] Relation: Friend  Code Status:   Allergies: Review of patient's allergies indicates no known allergies.  Chief Complaint  Patient presents with  . Acute Visit    Acute visitSecondary to cold symptoms-feeling weak  HPI: Patient is 80 y.o. male who was admitted to SNF after being hospitalized for leg hematoma and cellulitis who is being seen for cold symptoms and feeling weak   Patient does have a history renal insufficiency with baseline creatinine around 1.7- Most recent creatinine 1.52 on 09/22/2015.  He also has a history of low potassium this has been supplemented and appears to have normalize most recently 4.0 on lab March 14.  Apparently he's developed some cold-like symptoms with a nonproductive cough.  Also says he feels somewhat weak and feels this may be secondary to his respiratory issues.  He does not complain of any shortness of breath chest pain headache-does state he feels weak although he thinks this is a bit better than yesterday he also feels his cold symptoms are somewhat better he does not complain of fever or chills vital signs appear to be relatively stable     Patient is followed closely by urology and recently had a cystoscopy done did not show any acute abnormalities-     He also has a history of atrial fibrillation which appears to be rate controlled he is on Lopressor for rate control is on Xarelto for anticoagulation    --    Past Medical History  Diagnosis Date  . Type II or unspecified type diabetes mellitus  without mention of complication, not stated as uncontrolled   . Obesity, unspecified   . Unspecified essential hypertension   . Coronary atherosclerosis of unspecified type of vessel, native or graft   . Vestibulopathy   . Hypokalemia   . Atrial fibrillation ([place])   . Rhabdomyolysis   . Hypercholesteremia   . COPD (chronic obstructive pulmonary disease) ([place])   . OSA (obstructive sleep apnea)   . Anteroseptal myocardial infarction ([place])   . Obesity hypoventilation syndrome ([place])   . Hypertensive renal disease   . Chronic kidney disease   . CHF (congestive heart failure) ([place])   . Diverticulosis   . Glaucoma     Past Surgical History  Procedure Laterality Date  . Inguinal hernia repair Right 1985  . Coronary artery bypass graft  1997    PCI to LAD  . Nose surgery  1970s    Per Dr. Terrence Dupont [facility] in pt chart  . Knee arthroscopy Left 1991  . Lumbar fusion    . Appendectomy  1985  . Cholecystectomy  06/2000      Medication List       This list is accurate as of: 10/08/15 11:59 PM.  Always use your most recent med list.               ACIDOPHILUS PROBIOTIC 10 MG Tabs  Take 10 mg by mouth 3 (three) times daily.     atorvastatin 20 MG tablet  Commonly known as:  LIPITOR  Take 20 mg  by mouth daily at 6 PM.     cholecalciferol 1000 units tablet  Commonly known as:  VITAMIN D  Take 1,000 Units by mouth daily.     collagenase ointment  Commonly known as:  SANTYL  Apply topically daily.     furosemide 40 MG tablet  Commonly known as:  LASIX  Take 40 mg by mouth.     HYDROcodone-acetaminophen 5-325 MG tablet  Commonly known as:  NORCO  Take 1-2 tablets by mouth every 6 (six) hours as needed for moderate pain or severe pain.     latanoprost 0.005 % ophthalmic solution  Commonly known as:  XALATAN  Place 1 drop into both eyes at bedtime.     Magnesium 400 MG Tabs  Take 400 mg by mouth daily.     metoprolol tartrate 25 MG tablet  Commonly known as:  LOPRESSOR   Take 1 tablet (25 mg total) by mouth 2 (two) times daily.     nitroGLYCERIN 0.4 MG SL tablet  Commonly known as:  NITROSTAT  Place 0.4 mg under the tongue every 5 (five) minutes as needed for chest pain.     potassium chloride SA 20 MEQ tablet  Commonly known as:  K-DUR,KLOR-CON  Take 60 mEq by mouth daily.     Probiotic 250 MG Caps  Take 250 mg by mouth 2 (two) times daily.     saxagliptin HCl 5 MG Tabs tablet  Commonly known as:  ONGLYZA  Take 5 mg by mouth daily.     tamsulosin 0.4 MG Caps capsule  Commonly known as:  FLOMAX  Take 1 capsule (0.4 mg total) by mouth daily.     XARELTO 15 MG Tabs tablet  Generic drug:  Rivaroxaban  Take 15 mg by mouth daily with supper.            Social History  Substance Use Topics  . Smoking status: Never Smoker   . Smokeless tobacco: Never Used  . Alcohol Use: No    Review of Systems  DATA OBTAINED: from patient, nurse GENERAL:  no fevers, appetite changes--has increased fatigue SKIN: No itching, rash--has completed treatment for lower extremity cellulitis on the left HEENT: Is not really planning of nasal drainage or congestion or sore throat RESPIRATORY: Has a cough but does not really complain of increased shortness of breath beyond baseline-no wheezing CARDIAC: No chest pain, palpitations, baseline lower extremity venous stasis changes-- edema  GI: No abdominal pain, No N/V/D or constipation, No heartburn or reflux  GU: No dysuria, frequency or urgency, or incontinence hematuria appears to have resolved  MUSCULOSKELETAL: No unrelieved bone/joint pain NEUROLOGIC: No headache, dizziness  PSYCHIATRIC: No overt anxiety or sadness  Filed Vitals:   10/08/15 2300  BP: 132/80  Pulse: 80  Temp: 97.6 F (36.4 C)  Resp: 16    Physical Exam  GENERAL APPEARANCE: Alert, conversant, No acute distress  Lying in bed appears a bit more weak than I'm used to seeing Skin is warm and dry slightly diaphoretic on the back but he  was lying in bed on his back-- this appears to be localized  Oropharynx clear mucous membranes moist.  Eyes I could not really appreciate any drainage or discharge visual acuity appears intact RESPIRATORY: Breathing is even, unlabored. Lung sounds are clear   CARDIOVASCULAR: irregular irregular rate and rhythm without murmur gallop or rub Venous stasis changes/ 2+ lower extremity peripheral edema --this appears to be baseline GASTROINTESTINAL: Abdomen is soft, non-tender, obese w/ normal bowel  sounds.   MUSCULOSKELETAL: No abnormal joints or musculature -is able to move all extremities 4 grip strength is strong-when he sat on the side of the bed he did complain of feeling quite weak- NEUROLOGIC: Cranial nerves 2-12 grossly intact. Moves all extremities--no focal deficits-his speech is clear PSYCHIATRIC: Mood and affect appropriate to situation, no behavioral issues  Patient Active Problem List   Diagnosis Date Noted  . Cough 10/08/2015  . Hematuria 09/21/2015  . Hypokalemia 08/13/2015  . HTN (hypertension) 08/13/2015  . Multiple open wounds of lower leg 07/22/2015  . CKD (chronic kidney disease) stage 4, GFR 15-29 ml/min (HCC) 07/22/2015  . Cellulitis of leg, right 06/28/2015  . ARF (acute renal failure) ([place]) 06/28/2015  . Hypertensive heart disease with CHF (congestive heart failure) ([place]) 05/19/2015  . CAD (coronary artery disease), native coronary artery 05/19/2015  . Hyperlipidemia 05/19/2015  . Vitamin D deficiency 05/19/2015  . Pressure ulcer 05/16/2015  . Diastolic dysfunction with chronic heart failure ([place]) 05/08/2015  . Blisters of multiple sites 05/08/2015  . Type 2 diabetes mellitus ([place])   . Obesity, unspecified   . OSA (obstructive sleep apnea)   . Atrial fibrillation ([place])   . Secondary DM with CKD stage 4 and hypertension ([place])   . Glaucoma   . Cellulitis 05/07/2015   Labs  09/22/2015.  WBC 9.7 hemoglobin 13.8 platelets 279.  Sodium 142 potassium 4.0 BUN  26 creatinine 1.52  09/08/2015.  Sodium 142 potassium 4.2 BUN 31 creatinine 1.9 to.  09/03/2015.  WBC 9.1 hemoglobin 13.3 platelets 349.  Sodium 140 potassium 4 BUN 29 creatinine 1.72.    08/27/2015.  Sodium 142 potassium 3.3 BUN 31 creatinine 1.7-CO2 level is 30.  I do note on February 10 potassium was 4.0 and on February 7 was 3.6 it was as low as 3.2 on February 2 .  08/13/2015.  Sodium 144 potassium 3.2 BUN 22 creatinine 1.5 CO2 30.  Magnesium 1.7.  08/12/2015.  Sodium 143 potassium 3.3 BUN 15 creatinine 1.4.  Creatinine previously was 1.8 on January 30.  WBC 10.7 hemoglobin 14.2 platelets 292.  08/09/1998 1017.  Sodium 144 potassium 3.3 BUN 22 creatinine 1.8.  Albumin 3.1 otherwise liver function tests within normal limits.  TSH 3.8.  Hemoglobin A1c 5.6 CBC    Component Value Date/Time   WBC 14.6* 05/18/2015 0844   RBC 4.19* 05/18/2015 0844   HGB 11.9* 05/18/2015 0844   HCT 37.2* 05/18/2015 0844   PLT 454* 05/18/2015 0844   MCV 88.8 05/18/2015 0844   LYMPHSABS 1.4 05/07/2015 2237   MONOABS 1.2* 05/07/2015 2237   EOSABS 0.2 05/07/2015 2237   BASOSABS 0.0 05/07/2015 2237    CMP     Component Value Date/Time   NA 144 05/18/2015 0844   K 4.3 05/18/2015 0844   CL 107 05/18/2015 0844   CO2 25 05/18/2015 0844   GLUCOSE 170* 05/18/2015 0844   BUN 53* 05/18/2015 0844   CREATININE 1.99* 05/18/2015 0844   CALCIUM 8.9 05/18/2015 0844   PROT 5.5* 05/08/2015 0435   ALBUMIN 2.9* 05/08/2015 0435   AST 18 05/08/2015 0435   ALT 21 05/08/2015 0435   ALKPHOS 58 05/08/2015 0435   BILITOT 1.0 05/08/2015 0435   GFRNONAA 31* 05/18/2015 0844   GFRAA 36* 05/18/2015 0844    Assessment and Plan  # #1 cough-with weakness-possibly this could be respiratory related chest exam was fairly benign but will order chest x-ray especially concerned about his weakness here with cough.  Monitor vital signs  closely every shift were pulse ox for 72 hours.  Also will  obtain stat blood work in the morning including a CBC CMP and TSH.  Of note also will obtain a flu swab    #2 hypertension he continues on Lopressor- This appears to be stable as noted above when the larger cuff issues blood pressures appear to be satisfactory --suspect there will continue be some variability--pressure today stable at 132/80  3-3 of A. fib this appears rate controlled on  Lopressorand onXarelto for  DVT prophylaxis.  #4 history of chronic kidney disease This appears to have stabilized with creatinine 1.5 to on lab done 2 weeks ago will update this again tomorrow to keep an eye on the situation.  He also has a history of hypokalemia this is aggressively supplemented on 60 mEq potassium this will need updating as well   Of note did reevaluate patient later today actually appeared to be feeling bette--r a bit stronger resting in bed comfortably again will await the blood work   [id number]            Amante Fomby C,

## 2015-10-09 DIAGNOSIS — E039 Hypothyroidism, unspecified: Secondary | ICD-10-CM | POA: Diagnosis not present

## 2015-10-09 DIAGNOSIS — I10 Essential (primary) hypertension: Secondary | ICD-10-CM | POA: Diagnosis not present

## 2015-10-13 ENCOUNTER — Encounter: Payer: Self-pay | Admitting: Vascular Surgery

## 2015-10-13 DIAGNOSIS — I129 Hypertensive chronic kidney disease with stage 1 through stage 4 chronic kidney disease, or unspecified chronic kidney disease: Secondary | ICD-10-CM | POA: Diagnosis not present

## 2015-10-13 DIAGNOSIS — I70238 Atherosclerosis of native arteries of right leg with ulceration of other part of lower right leg: Secondary | ICD-10-CM | POA: Diagnosis not present

## 2015-10-13 NOTE — Progress Notes (Signed)
[facility] [place] HISTORY AND PHYSICAL    Referring Physician: Dr Netta Neat History of Present Illness:  Patient is a 80 y.o. male who presents for evaluation of intermittent recurrent ulcers of his lower extremities. The patient has chronic leg swelling. He occasionally develops ulcers in the pretibial region. These have healed spontaneously in the past. The pretibial ulcers are healed but he now has a lateral malleolus ulcer on the right leg. The patient is minimally ambulatory and does transfer with a walker. He currently resides at a skilled nursing facility due to deconditioning after recent hospitalization. At the time of his hospitalization he did have some renal dysfunction but his creatinine has essentially return to normal at this point.  Other medical problems include diabetes, obesity, hypertension, coronary artery disease, atrial fibrillation, elevated cholesterol, COPD and CHF all of which are currently stable. He is on Xarelto for his atrial fibrillation.  He returns today for follow up after CT angio for examination of arterial occlusive disease. This showed no significant aortoiliac disease but he did have bilateral moderate popliteal stenosis    Past Medical History   Diagnosis  Date   .  Type II or unspecified type diabetes mellitus without mention of complication, not stated as uncontrolled     .  Obesity, unspecified     .  Unspecified essential hypertension     .  Coronary atherosclerosis of unspecified type of vessel, native or graft     .  Vestibulopathy     .  Hypokalemia     .  Atrial fibrillation ([place])     .  Rhabdomyolysis     .  Hypercholesteremia     .  COPD (chronic obstructive pulmonary disease) ([place])     .  OSA (obstructive sleep apnea)     .  Anteroseptal myocardial infarction ([place])     .  Obesity hypoventilation syndrome ([place])     .  Hypertensive renal disease     .  Chronic kidney disease     .  CHF (congestive heart failure) ([place])      .  Diverticulosis     .  Glaucoma         Past Surgical History   Procedure  Laterality  Date   .  Inguinal hernia repair  Right  1985   .  Coronary artery bypass graft    1997       PCI to LAD   .  Nose surgery    1970s       Per Dr. Terrence Dupont [facility] in pt chart   .  Knee arthroscopy  Left  1991   .  Lumbar fusion       .  Appendectomy    1985   .  Cholecystectomy    06/2000     Social History Social History   Substance Use Topics   .  Smoking status:  Never Smoker    .  Smokeless tobacco:  Never Used   .  Alcohol Use:  No     Family History Family History   Problem  Relation  Age of Onset   .  COPD  Mother     .  Diabetes  Father     .  Diabetes  Sister     .  Heart disease  Brother     .  Breast cancer  Maternal Aunt     .  Diabetes  Paternal Grandmother     .  Colon cancer  Maternal Aunt     .  Ovarian cancer  Daughter       Allergies  No Known Allergies     Current Outpatient Prescriptions   Medication  Sig  Dispense  Refill   .  atorvastatin (LIPITOR) 20 MG tablet  Take 20 mg by mouth daily at 6 PM.        .  cholecalciferol (VITAMIN D) 1000 UNITS tablet  Take 1,000 Units by mouth daily.       .  ciprofloxacin (CIPRO) 250 MG tablet  Take 250 mg by mouth 2 (two) times daily.       .  furosemide (LASIX) 40 MG tablet  Take 40 mg by mouth.       Marland Kitchen  HYDROcodone-acetaminophen (NORCO) 5-325 MG tablet  Take 1-2 tablets by mouth every 6 (six) hours as needed for moderate pain or severe pain.  30 tablet  0   .  Lactobacillus (ACIDOPHILUS PROBIOTIC) 10 MG TABS  Take 10 mg by mouth 3 (three) times daily. (Patient taking differently: Take 10 mg by mouth at bedtime. )  90 tablet  0   .  latanoprost (XALATAN) 0.005 % ophthalmic solution  Place 1 drop into both eyes at bedtime.       .  Magnesium 400 MG TABS  Take 400 mg by mouth daily.       .  metoprolol tartrate (LOPRESSOR) 25 MG tablet  Take 1 tablet (25 mg total) by mouth 2 (two) times daily.  60 tablet  3   .   nitroGLYCERIN (NITROSTAT) 0.4 MG SL tablet  Place 0.4 mg under the tongue every 5 (five) minutes as needed for chest pain.       .  potassium chloride SA (K-DUR,KLOR-CON) 20 MEQ tablet  Take 40 mEq by mouth daily.       .  Saccharomyces boulardii (PROBIOTIC) 250 MG CAPS  Take 250 mg by mouth 2 (two) times daily.       .  saxagliptin HCl (ONGLYZA) 5 MG TABS tablet  Take 5 mg by mouth daily.       Alveda Reasons 15 MG TABS tablet  Take 15 mg by mouth daily with supper.        Marland Kitchen  amiodarone (PACERONE) 200 MG tablet  Take 200 mg by mouth at bedtime.        .  collagenase (SANTYL) ointment  Apply topically daily.  15 g  0   .  tamsulosin (FLOMAX) 0.4 MG CAPS capsule  Take 1 capsule (0.4 mg total) by mouth daily.  30 capsule  3      No current facility-administered medications for this visit.     ROS:    General:  No weight loss, Fever, chills  HEENT: No recent headaches, no nasal bleeding, no visual changes, no sore throat  Neurologic: No dizziness, blackouts, seizures. No recent symptoms of stroke or mini- stroke. No recent episodes of slurred speech, or temporary blindness.  Cardiac: No recent episodes of chest pain/pressure, no shortness of breath at rest.  + shortness of breath with exertion. + history of atrial fibrillation or irregular heartbeat  Vascular: No history of rest pain in feet.  No history of claudication.  + history of non-healing ulcer, No history of DVT    Pulmonary: No home oxygen, no productive cough, no hemoptysis,  No asthma or wheezing  Musculoskeletal:  [ ]  Arthritis, [ ]  Low back pain,  [x ]  Joint pain  Hematologic:No history of hypercoagulable state.  No history of easy bleeding.  No history of anemia  Gastrointestinal: No hematochezia or melena,  No gastroesophageal reflux, no trouble swallowing  Urinary: [ x] chronic Kidney disease, [ ]  on HD - [ ]  MWF or [ ]  TTHS, [ ]  Burning with urination, [ ]  Frequent urination, [ ]  Difficulty urinating;    Skin: No  rashes  Psychological: No history of anxiety,  No history of depression   Physical Examination     Filed Vitals:   10/14/15 1120  BP: 120/75  Pulse: 60  Temp: 97 F (36.1 C)  TempSrc: Oral  Resp: 16  Height: 5\' 10"  (1.778 m)  Weight: 260 lb (117.935 kg)  SpO2: 97%   Body mass index is 40.75 kg/(m^2).  General:  Alert and oriented, no acute distress HEENT: Normal Neck: No bruit or JVD Pulmonary: Clear to auscultation bilaterally Cardiac: Regular Rate and Rhythm without murmur Abdomen: Soft, non-tender, non-distended, no mass, no scars Skin: No rash, 3 cm ulceration just above the right lateral malleolus with some edema in both lower extremities and brawny staining ulcer is less than 1 mm depth Extremity Pulses:  2+ radial, brachial, unable to palpate femoral due to the patent patient being in a wheelchair and could not be moved to a table absent dorsalis pedis, posterior tibial pulses bilaterally Musculoskeletal: No deformity 1+ edema bilaterally            Neurologic: Upper and lower extremity motor 5/5 and symmetric  DATA:  ABI on the right was 0.95 left was 0.93 but vessels were calcified suggesting artificial increase. No significant aortoiliac disease but he did have bilateral moderate popliteal stenosis on his recent CT Angio  ASSESSMENT:  Most likely patient has mixed pathology with element of venous hypertension from his obesity and congestive failure as well as some element of arterial occlusive disease. He currently has a stasis ulcer just above the lateral malleolus on the right leg. This should heal with compression therapy. We'll place him in an Haematologist today. I believe the mainstay of therapy for him is going to be ulcer prevention through compression therapy. He is currently receiving this.   He will have weekly Unna boot changes on the right leg. He will wear an Ace wrap on the left leg. He will follow-up with [place] in 1 month and had a venous duplex of that time to  assess for reflux.  PLAN:  See above  Ruta Hinds, MD [facility] [place] Office: [phone] Pager: [phone]

## 2015-10-14 ENCOUNTER — Ambulatory Visit (INDEPENDENT_AMBULATORY_CARE_PROVIDER_SITE_OTHER): Payer: Medicare Other | Admitting: Vascular Surgery

## 2015-10-14 ENCOUNTER — Encounter: Payer: Self-pay | Admitting: Vascular Surgery

## 2015-10-14 VITALS — BP 120/75 | HR 60 | Temp 97.0°F | Resp 16 | Ht 70.0 in | Wt 260.0 lb

## 2015-10-14 DIAGNOSIS — I70239 Atherosclerosis of native arteries of right leg with ulceration of unspecified site: Secondary | ICD-10-CM | POA: Diagnosis not present

## 2015-10-14 DIAGNOSIS — I70249 Atherosclerosis of native arteries of left leg with ulceration of unspecified site: Secondary | ICD-10-CM

## 2015-10-14 DIAGNOSIS — E875 Hyperkalemia: Secondary | ICD-10-CM | POA: Diagnosis not present

## 2015-10-14 DIAGNOSIS — I739 Peripheral vascular disease, unspecified: Secondary | ICD-10-CM | POA: Diagnosis not present

## 2015-10-14 DIAGNOSIS — I129 Hypertensive chronic kidney disease with stage 1 through stage 4 chronic kidney disease, or unspecified chronic kidney disease: Secondary | ICD-10-CM | POA: Diagnosis not present

## 2015-10-14 NOTE — Addendum Note (Signed)
Addended by: Peter Minium K on: 10/14/2015 05:00 PM   Modules accepted: Orders

## 2015-10-15 ENCOUNTER — Non-Acute Institutional Stay (SKILLED_NURSING_FACILITY): Payer: Medicare Other | Admitting: Internal Medicine

## 2015-10-15 DIAGNOSIS — R899 Unspecified abnormal finding in specimens from other organs, systems and tissues: Secondary | ICD-10-CM

## 2015-10-15 DIAGNOSIS — N184 Chronic kidney disease, stage 4 (severe): Secondary | ICD-10-CM

## 2015-10-15 NOTE — Progress Notes (Signed)
Patient ID: Reginald [name], male   DOB: 06/26/1936, 80 y.o.   MRN: [id number]     MRN: [id number] Name: Reginald [name]  Sex: male Age: 80 y.o. DOB: 26-Sep-1935  [place] #: [name] Facility/Room: Level Of Care: SNF Provider: Wille Celeste Emergency Contacts: Extended Emergency Contact Information Primary Emergency Contact: Reginald [name] Address: [place] Phone: [phone] Relation: Friend  Code Status:   Allergies: Review of patient's allergies indicates no known allergies.  Chief Complaint  Patient presents with  . Acute Visit    Acute visitSecondary to Labs showing hyperkalemia  HPI: Patient is 80 y.o. male with a complex medical history including history of chronic kidney disease and hypokalemia-potassium has been supplemented aggressively currently 60 mEq daily and this appears to have stabilized on recent labs it was 4.0 on lab done back on March 14.  An updated fairly routine lab was done this week came back showing a potassium of 7.6 renal function was stable with creatinine of 1.65.  Lab report indicated there was some hemolysis-Dr. Sheppard Coil did see the lab yesterday and ordered a stat BMP-also ordered an EKG which apparently was unremarkable.  I have not seen the updated lab but I did discuss this with Dr. Sheppard Coil who says she did see the updated lab yesterday afternoon and potassium was unremarkable at 4.2 and decision was made to keep him at current potassium dose of 60 mEq a day  Currently patient is stable vital signs are unremarkable For Some Respiratory Issues Several Days Ago but He Says He Is Doing Better in This Regard Does Not Have Any Complaints Today He Is Resting in Bed Comfortably           --    Past Medical History  Diagnosis Date  . Type II or unspecified type diabetes mellitus without mention of complication, not stated as uncontrolled   . Obesity, unspecified   . Unspecified  essential hypertension   . Coronary atherosclerosis of unspecified type of vessel, native or graft   . Vestibulopathy   . Hypokalemia   . Atrial fibrillation ([place])   . Rhabdomyolysis   . Hypercholesteremia   . COPD (chronic obstructive pulmonary disease) ([place])   . OSA (obstructive sleep apnea)   . Anteroseptal myocardial infarction ([place])   . Obesity hypoventilation syndrome ([place])   . Hypertensive renal disease   . Chronic kidney disease   . CHF (congestive heart failure) ([place])   . Diverticulosis   . Glaucoma     Past Surgical History  Procedure Laterality Date  . Inguinal hernia repair Right 1985  . Coronary artery bypass graft  1997    PCI to LAD  . Nose surgery  1970s    Per Dr. Terrence Dupont [facility] in pt chart  . Knee arthroscopy Left 1991  . Lumbar fusion    . Appendectomy  1985  . Cholecystectomy  06/2000      Medication List       This list is accurate as of: 10/15/15 11:59 PM.  Always use your most recent med list.               ACIDOPHILUS PROBIOTIC 10 MG Tabs  Take 10 mg by mouth 3 (three) times daily.     atorvastatin 20 MG tablet  Commonly known as:  LIPITOR  Take 20 mg by mouth daily at 6 PM.     cholecalciferol 1000 units tablet  Commonly known as:  VITAMIN D  Take 1,000 Units by mouth daily.     collagenase ointment  Commonly known as:  SANTYL  Apply topically daily.     furosemide 40 MG tablet  Commonly known as:  LASIX  Take 40 mg by mouth.     HYDROcodone-acetaminophen 5-325 MG tablet  Commonly known as:  NORCO  Take 1-2 tablets by mouth every 6 (six) hours as needed for moderate pain or severe pain.     latanoprost 0.005 % ophthalmic solution  Commonly known as:  XALATAN  Place 1 drop into both eyes at bedtime.     Magnesium 400 MG Tabs  Take 400 mg by mouth daily.     metoprolol tartrate 25 MG tablet  Commonly known as:  LOPRESSOR  Take 1 tablet (25 mg total) by mouth 2 (two) times daily.     nitroGLYCERIN 0.4 MG SL tablet  Commonly  known as:  NITROSTAT  Place 0.4 mg under the tongue every 5 (five) minutes as needed for chest pain.     potassium chloride SA 20 MEQ tablet  Commonly known as:  K-DUR,KLOR-CON  Take 60 mEq by mouth daily.     Probiotic 250 MG Caps  Take 250 mg by mouth 2 (two) times daily.     saxagliptin HCl 5 MG Tabs tablet  Commonly known as:  ONGLYZA  Take 5 mg by mouth daily.     tamsulosin 0.4 MG Caps capsule  Commonly known as:  FLOMAX  Take 1 capsule (0.4 mg total) by mouth daily.     XARELTO 15 MG Tabs tablet  Generic drug:  Rivaroxaban  Take 15 mg by mouth daily with supper.            Social History  Substance Use Topics  . Smoking status: Never Smoker   . Smokeless tobacco: Never Used  . Alcohol Use: No    Review of Systems  DATA OBTAINED: from patient, nurse GENERAL:  no fevers, appetite changes-Feeling stronger than he did several days ago SKIN: No itching, rash--has completed treatment for lower extremity cellulitis on the left HEENT: Is not really planning of nasal drainage or congestion or sore throat RESPIRATORY:  No shortness of breath apparently cough has improved CARDIAC: No chest pain, palpitations, baseline lower extremity venous stasis changes-- edema  GI: No abdominal pain, No N/V/D or constipation, No heartburn or reflux  GU: No dysuria, frequency or urgency, or incontinence hematuria appears to have resolved  MUSCULOSKELETAL: No unrelieved bone/joint pain NEUROLOGIC: No headache, dizziness  PSYCHIATRIC: No overt anxiety or sadness  Filed Vitals:   10/15/15 2135  BP: 138/74  Pulse: 68  Temp: 98 F (36.7 C)  Resp: 22    Physical Exam  GENERAL APPEARANCE: Alert, conversant, No acute distress  Appears a bit more energetic than when I saw him previously Skin is warm and dry    Oropharynx clear mucous membranes moist.  Eyes I could not really appreciate any drainage or discharge visual acuity appears intact RESPIRATORY: Breathing is even,  unlabored. Lung sounds are clear   CARDIOVASCULAR: irregular irregular rate and rhythm without murmur gallop or rub lower extremities are currently wrapped GASTROINTESTINAL: Abdomen is soft, non-tender, obese w/ normal bowel sounds.   MUSCULOSKELETAL: No abnormal joints or musculature -is able to move all extremities 4 - NEUROLOGIC: Cranial nerves 2-12 grossly intact. Moves all extremities--no focal deficits-his speech is clear PSYCHIATRIC: Mood and affect appropriate to situation, no behavioral issues  Patient Active  Problem List   Diagnosis Date Noted  . Abnormal laboratory test result 10/15/2015  . Cough 10/08/2015  . Hematuria 09/21/2015  . Hypokalemia 08/13/2015  . HTN (hypertension) 08/13/2015  . Multiple open wounds of lower leg 07/22/2015  . CKD (chronic kidney disease) stage 4, GFR 15-29 ml/min (HCC) 07/22/2015  . Cellulitis of leg, right 06/28/2015  . ARF (acute renal failure) ([place]) 06/28/2015  . Hypertensive heart disease with CHF (congestive heart failure) ([place]) 05/19/2015  . CAD (coronary artery disease), native coronary artery 05/19/2015  . Hyperlipidemia 05/19/2015  . Vitamin D deficiency 05/19/2015  . Pressure ulcer 05/16/2015  . Diastolic dysfunction with chronic heart failure ([place]) 05/08/2015  . Blisters of multiple sites 05/08/2015  . Type 2 diabetes mellitus ([place])   . Obesity, unspecified   . OSA (obstructive sleep apnea)   . Atrial fibrillation ([place])   . Secondary DM with CKD stage 4 and hypertension ([place])   . Glaucoma   . Cellulitis 05/07/2015   Labs  10/13/2015.  Sodium 137 potassium 7.6 BUN 30 creatinine 1.65.--Specimen was hemolyzed  Of note update labs shows potassium actually was 4.2 per discussion with Dr. Sheppard Coil  09/22/2015.  WBC 9.7 hemoglobin 13.8 platelets 279.  Sodium 142 potassium 4.0 BUN 26 creatinine 1.52  09/08/2015.  Sodium 142 potassium 4.2 BUN 31 creatinine 1.9 to.  09/03/2015.  WBC 9.1 hemoglobin 13.3 platelets  349.  Sodium 140 potassium 4 BUN 29 creatinine 1.72.    08/27/2015.  Sodium 142 potassium 3.3 BUN 31 creatinine 1.7-CO2 level is 30.  I do note on February 10 potassium was 4.0 and on February 7 was 3.6 it was as low as 3.2 on February 2 .  08/13/2015.  Sodium 144 potassium 3.2 BUN 22 creatinine 1.5 CO2 30.  Magnesium 1.7.  08/12/2015.  Sodium 143 potassium 3.3 BUN 15 creatinine 1.4.  Creatinine previously was 1.8 on January 30.  WBC 10.7 hemoglobin 14.2 platelets 292.  08/09/1998 1017.  Sodium 144 potassium 3.3 BUN 22 creatinine 1.8.  Albumin 3.1 otherwise liver function tests within normal limits.  TSH 3.8.  Hemoglobin A1c 5.6 CBC    Component Value Date/Time   WBC 14.6* 05/18/2015 0844   RBC 4.19* 05/18/2015 0844   HGB 11.9* 05/18/2015 0844   HCT 37.2* 05/18/2015 0844   PLT 454* 05/18/2015 0844   MCV 88.8 05/18/2015 0844   LYMPHSABS 1.4 05/07/2015 2237   MONOABS 1.2* 05/07/2015 2237   EOSABS 0.2 05/07/2015 2237   BASOSABS 0.0 05/07/2015 2237    CMP     Component Value Date/Time   NA 144 05/18/2015 0844   K 4.3 05/18/2015 0844   CL 107 05/18/2015 0844   CO2 25 05/18/2015 0844   GLUCOSE 170* 05/18/2015 0844   BUN 53* 05/18/2015 0844   CREATININE 1.99* 05/18/2015 0844   CALCIUM 8.9 05/18/2015 0844   PROT 5.5* 05/08/2015 0435   ALBUMIN 2.9* 05/08/2015 0435   AST 18 05/08/2015 0435   ALT 21 05/08/2015 0435   ALKPHOS 58 05/08/2015 0435   BILITOT 1.0 05/08/2015 0435   GFRNONAA 31* 05/18/2015 0844   GFRAA 36* 05/18/2015 0844    Assessment and Plan   #1-history of hyperkalemia?-This appears to been a hemolyzed sample again updated lab of been assured shows a normal potassium 4.2-clinically he appears to be at baseline-at this point will monitor will continue with updated labs serially keep an eye on this-.  He appears to have stabilized on 60 mEq potassium he is also on magnesium supplementation  #  2 chronic kidney disease this appears relatively  stable as noted above with a creatinine in the mid ones-again will update this serially   to keep an eye on  It  [id number]            Bilan Tedesco C,

## 2015-10-27 ENCOUNTER — Non-Acute Institutional Stay (SKILLED_NURSING_FACILITY): Payer: Medicare Other | Admitting: Internal Medicine

## 2015-10-27 ENCOUNTER — Encounter: Payer: Self-pay | Admitting: Internal Medicine

## 2015-10-27 DIAGNOSIS — I5032 Chronic diastolic (congestive) heart failure: Secondary | ICD-10-CM

## 2015-10-27 DIAGNOSIS — I25118 Atherosclerotic heart disease of native coronary artery with other forms of angina pectoris: Secondary | ICD-10-CM

## 2015-10-27 DIAGNOSIS — I70238 Atherosclerosis of native arteries of right leg with ulceration of other part of lower right leg: Secondary | ICD-10-CM | POA: Diagnosis not present

## 2015-10-27 DIAGNOSIS — I110 Hypertensive heart disease with heart failure: Secondary | ICD-10-CM | POA: Diagnosis not present

## 2015-10-27 NOTE — Progress Notes (Signed)
MRN: [id number] Name: Reginald [name]  Sex: male Age: 80 y.o. DOB: 1935/07/22  [place] #: [facility] Facility/Room:307 Level Of Care: SNF Provider: Inocencio Homes D Emergency Contacts: Extended Emergency Contact Information Primary Emergency Contact: Elder,Margie Address: [place] Phone: [phone] Relation: Friend  Code Status:   Allergies: Review of patient's allergies indicates no known allergies.  Chief Complaint  Patient presents with  . Medical Management of Chronic Issues    HPI: Patient is 80 y.o. male who is being seen for routine issues of HTN, CHF and CAD.  Past Medical History  Diagnosis Date  . Type II or unspecified type diabetes mellitus without mention of complication, not stated as uncontrolled   . Obesity, unspecified   . Unspecified essential hypertension   . Coronary atherosclerosis of unspecified type of vessel, native or graft   . Vestibulopathy   . Hypokalemia   . Atrial fibrillation ([place])   . Rhabdomyolysis   . Hypercholesteremia   . COPD (chronic obstructive pulmonary disease) ([place])   . OSA (obstructive sleep apnea)   . Anteroseptal myocardial infarction ([place])   . Obesity hypoventilation syndrome ([place])   . Hypertensive renal disease   . Chronic kidney disease   . CHF (congestive heart failure) ([place])   . Diverticulosis   . Glaucoma     Past Surgical History  Procedure Laterality Date  . Inguinal hernia repair Right 1985  . Coronary artery bypass graft  1997    PCI to LAD  . Nose surgery  1970s    Per Dr. Terrence Dupont [facility] in pt chart  . Knee arthroscopy Left 1991  . Lumbar fusion    . Appendectomy  1985  . Cholecystectomy  06/2000      Medication List       This list is accurate as of: 10/27/15 11:59 PM.  Always use your most recent med list.               ACIDOPHILUS PROBIOTIC 10 MG Tabs  Take 10 mg by mouth 3 (three) times daily.     atorvastatin 20 MG tablet  Commonly known  as:  LIPITOR  Take 20 mg by mouth daily at 6 PM.     cholecalciferol 1000 units tablet  Commonly known as:  VITAMIN D  Take 1,000 Units by mouth daily.     collagenase ointment  Commonly known as:  SANTYL  Apply topically daily.     furosemide 40 MG tablet  Commonly known as:  LASIX  Take 40 mg by mouth.     HYDROcodone-acetaminophen 5-325 MG tablet  Commonly known as:  NORCO  Take 1-2 tablets by mouth every 6 (six) hours as needed for moderate pain or severe pain.     latanoprost 0.005 % ophthalmic solution  Commonly known as:  XALATAN  Place 1 drop into both eyes at bedtime.     Magnesium 400 MG Tabs  Take 400 mg by mouth daily.     metoprolol tartrate 25 MG tablet  Commonly known as:  LOPRESSOR  Take 1 tablet (25 mg total) by mouth 2 (two) times daily.     nitroGLYCERIN 0.4 MG SL tablet  Commonly known as:  NITROSTAT  Place 0.4 mg under the tongue every 5 (five) minutes as needed for chest pain.     potassium chloride SA 20 MEQ tablet  Commonly known as:  K-DUR,KLOR-CON  Take 60 mEq by mouth  daily.     Probiotic 250 MG Caps  Take 250 mg by mouth 2 (two) times daily.     saxagliptin HCl 5 MG Tabs tablet  Commonly known as:  ONGLYZA  Take 5 mg by mouth daily.     tamsulosin 0.4 MG Caps capsule  Commonly known as:  FLOMAX  Take 1 capsule (0.4 mg total) by mouth daily.     XARELTO 15 MG Tabs tablet  Generic drug:  Rivaroxaban  Take 15 mg by mouth daily with supper.        No orders of the defined types were placed in this encounter.     There is no immunization history on file for this patient.  Social History  Substance Use Topics  . Smoking status: Never Smoker   . Smokeless tobacco: Never Used  . Alcohol Use: No    Review of Systems  DATA OBTAINED: from patient, nurse GENERAL:  no fevers, fatigue, appetite changes SKIN: No itching, rash, ulcers wrapped HEENT: No complaint RESPIRATORY: No cough, wheezing, SOB CARDIAC: No chest pain,  palpitations, lower extremity edema  GI: No abdominal pain, No N/V/D or constipation, No heartburn or reflux  GU: No dysuria, frequency or urgency, or incontinence  MUSCULOSKELETAL: No unrelieved bone/joint pain NEUROLOGIC: No headache, dizziness  PSYCHIATRIC: No overt anxiety or sadness  Filed Vitals:   10/27/15 1506  BP: 117/78  Pulse: 90  Temp: 97.6 F (36.4 C)  Resp: 20    Physical Exam  GENERAL APPEARANCE: Alert, conversant, No acute distress  SKIN: No diaphoresis rash;pt with unaboot on R, ace wrap on L HEENT: Unremarkable RESPIRATORY: Breathing is even, unlabored. Lung sounds are clear   CARDIOVASCULAR: Heart RRR no murmurs, rubs or gallops; legs wrapped GASTROINTESTINAL: Abdomen is soft, non-tender, not distended w/ normal bowel sounds.  GENITOURINARY: Bladder non tender, not distended  MUSCULOSKELETAL: No abnormal joints or musculature NEUROLOGIC: Cranial nerves 2-12 grossly intact. Moves all extremities PSYCHIATRIC: Mood and affect appropriate to situation, no behavioral issues  Patient Active Problem List   Diagnosis Date Noted  . Abnormal laboratory test result 10/15/2015  . Cough 10/08/2015  . Hematuria 09/21/2015  . Hypokalemia 08/13/2015  . HTN (hypertension) 08/13/2015  . Multiple open wounds of lower leg 07/22/2015  . CKD (chronic kidney disease) stage 4, GFR 15-29 ml/min (HCC) 07/22/2015  . Cellulitis of leg, right 06/28/2015  . ARF (acute renal failure) ([place]) 06/28/2015  . Hypertensive heart disease with CHF (congestive heart failure) ([place]) 05/19/2015  . CAD (coronary artery disease), native coronary artery 05/19/2015  . Hyperlipidemia 05/19/2015  . Vitamin D deficiency 05/19/2015  . Pressure ulcer 05/16/2015  . Diastolic dysfunction with chronic heart failure ([place]) 05/08/2015  . Blisters of multiple sites 05/08/2015  . Type 2 diabetes mellitus ([place])   . Obesity, unspecified   . OSA (obstructive sleep apnea)   . Atrial fibrillation ([place])   .  Secondary DM with CKD stage 4 and hypertension ([place])   . Glaucoma   . Cellulitis 05/07/2015    CBC    Component Value Date/Time   WBC 14.6* 05/18/2015 0844   RBC 4.19* 05/18/2015 0844   HGB 11.9* 05/18/2015 0844   HCT 37.2* 05/18/2015 0844   PLT 454* 05/18/2015 0844   MCV 88.8 05/18/2015 0844   LYMPHSABS 1.4 05/07/2015 2237   MONOABS 1.2* 05/07/2015 2237   EOSABS 0.2 05/07/2015 2237   BASOSABS 0.0 05/07/2015 2237    CMP     Component Value Date/Time   NA 144  05/18/2015 0844   K 4.3 05/18/2015 0844   CL 107 05/18/2015 0844   CO2 25 05/18/2015 0844   GLUCOSE 170* 05/18/2015 0844   BUN 53* 05/18/2015 0844   CREATININE 1.99* 05/18/2015 0844   CALCIUM 8.9 05/18/2015 0844   PROT 5.5* 05/08/2015 0435   ALBUMIN 2.9* 05/08/2015 0435   AST 18 05/08/2015 0435   ALT 21 05/08/2015 0435   ALKPHOS 58 05/08/2015 0435   BILITOT 1.0 05/08/2015 0435   GFRNONAA 31* 05/18/2015 0844   GFRAA 36* 05/18/2015 0844    Assessment and Plan  Hypertensive heart disease with CHF (congestive heart failure) (HCC) BP controlled ; cont metoprolol 25 BID and lasix 40 mg daily  Diastolic dysfunction with chronic heart failure (HCC) Chronic and stable ; cont lopressor 25 mg BID and lasix 40 mg  CAD (coronary artery disease), native coronary artery S/p stent; plan - cont metoprolol, xarelto and statin    Hennie Duos, MD

## 2015-10-31 ENCOUNTER — Encounter: Payer: Self-pay | Admitting: Internal Medicine

## 2015-10-31 NOTE — Assessment & Plan Note (Signed)
S/p stent; plan - cont metoprolol, xarelto and statin

## 2015-10-31 NOTE — Assessment & Plan Note (Signed)
BP controlled ; cont metoprolol 25 BID and lasix 40 mg daily

## 2015-10-31 NOTE — Assessment & Plan Note (Signed)
Chronic and stable ; cont lopressor 25 mg BID and lasix 40 mg

## 2015-11-03 DIAGNOSIS — L03115 Cellulitis of right lower limb: Secondary | ICD-10-CM | POA: Diagnosis not present

## 2015-11-03 DIAGNOSIS — R2681 Unsteadiness on feet: Secondary | ICD-10-CM | POA: Diagnosis not present

## 2015-11-03 DIAGNOSIS — M6281 Muscle weakness (generalized): Secondary | ICD-10-CM | POA: Diagnosis not present

## 2015-11-03 DIAGNOSIS — I70238 Atherosclerosis of native arteries of right leg with ulceration of other part of lower right leg: Secondary | ICD-10-CM | POA: Diagnosis not present

## 2015-11-04 DIAGNOSIS — R2681 Unsteadiness on feet: Secondary | ICD-10-CM | POA: Diagnosis not present

## 2015-11-04 DIAGNOSIS — L03115 Cellulitis of right lower limb: Secondary | ICD-10-CM | POA: Diagnosis not present

## 2015-11-04 DIAGNOSIS — M6281 Muscle weakness (generalized): Secondary | ICD-10-CM | POA: Diagnosis not present

## 2015-11-05 DIAGNOSIS — R2681 Unsteadiness on feet: Secondary | ICD-10-CM | POA: Diagnosis not present

## 2015-11-05 DIAGNOSIS — L03115 Cellulitis of right lower limb: Secondary | ICD-10-CM | POA: Diagnosis not present

## 2015-11-05 DIAGNOSIS — M6281 Muscle weakness (generalized): Secondary | ICD-10-CM | POA: Diagnosis not present

## 2015-11-06 DIAGNOSIS — M6281 Muscle weakness (generalized): Secondary | ICD-10-CM | POA: Diagnosis not present

## 2015-11-06 DIAGNOSIS — L03115 Cellulitis of right lower limb: Secondary | ICD-10-CM | POA: Diagnosis not present

## 2015-11-06 DIAGNOSIS — R2681 Unsteadiness on feet: Secondary | ICD-10-CM | POA: Diagnosis not present

## 2015-11-07 DIAGNOSIS — R2681 Unsteadiness on feet: Secondary | ICD-10-CM | POA: Diagnosis not present

## 2015-11-07 DIAGNOSIS — M6281 Muscle weakness (generalized): Secondary | ICD-10-CM | POA: Diagnosis not present

## 2015-11-07 DIAGNOSIS — L03115 Cellulitis of right lower limb: Secondary | ICD-10-CM | POA: Diagnosis not present

## 2015-11-08 DIAGNOSIS — L03115 Cellulitis of right lower limb: Secondary | ICD-10-CM | POA: Diagnosis not present

## 2015-11-08 DIAGNOSIS — R2681 Unsteadiness on feet: Secondary | ICD-10-CM | POA: Diagnosis not present

## 2015-11-08 DIAGNOSIS — M6281 Muscle weakness (generalized): Secondary | ICD-10-CM | POA: Diagnosis not present

## 2015-11-09 ENCOUNTER — Encounter: Payer: Self-pay | Admitting: Internal Medicine

## 2015-11-09 ENCOUNTER — Ambulatory Visit (HOSPITAL_COMMUNITY)
Admission: RE | Admit: 2015-11-09 | Discharge: 2015-11-09 | Disposition: A | Discharge status: Home / Self Care | Payer: Medicare Other | Source: Ambulatory Visit | Attending: Surgery | Admitting: Surgery

## 2015-11-09 ENCOUNTER — Non-Acute Institutional Stay (SKILLED_NURSING_FACILITY): Payer: Medicare Other | Admitting: Internal Medicine

## 2015-11-09 DIAGNOSIS — I2510 Atherosclerotic heart disease of native coronary artery without angina pectoris: Secondary | ICD-10-CM | POA: Insufficient documentation

## 2015-11-09 DIAGNOSIS — L03115 Cellulitis of right lower limb: Secondary | ICD-10-CM

## 2015-11-09 DIAGNOSIS — F4320 Adjustment disorder, unspecified: Secondary | ICD-10-CM | POA: Diagnosis not present

## 2015-11-09 DIAGNOSIS — I4891 Unspecified atrial fibrillation: Secondary | ICD-10-CM

## 2015-11-09 DIAGNOSIS — E7800 Pure hypercholesterolemia, unspecified: Secondary | ICD-10-CM | POA: Diagnosis not present

## 2015-11-09 DIAGNOSIS — I8393 Asymptomatic varicose veins of bilateral lower extremities: Secondary | ICD-10-CM | POA: Insufficient documentation

## 2015-11-09 DIAGNOSIS — I5032 Chronic diastolic (congestive) heart failure: Secondary | ICD-10-CM | POA: Diagnosis not present

## 2015-11-09 DIAGNOSIS — G4733 Obstructive sleep apnea (adult) (pediatric): Secondary | ICD-10-CM | POA: Diagnosis not present

## 2015-11-09 DIAGNOSIS — N184 Chronic kidney disease, stage 4 (severe): Secondary | ICD-10-CM

## 2015-11-09 DIAGNOSIS — R2681 Unsteadiness on feet: Secondary | ICD-10-CM | POA: Diagnosis not present

## 2015-11-09 DIAGNOSIS — N189 Chronic kidney disease, unspecified: Secondary | ICD-10-CM | POA: Diagnosis not present

## 2015-11-09 DIAGNOSIS — E1122 Type 2 diabetes mellitus with diabetic chronic kidney disease: Secondary | ICD-10-CM | POA: Diagnosis not present

## 2015-11-09 DIAGNOSIS — I739 Peripheral vascular disease, unspecified: Secondary | ICD-10-CM | POA: Insufficient documentation

## 2015-11-09 DIAGNOSIS — R609 Edema, unspecified: Secondary | ICD-10-CM | POA: Diagnosis present

## 2015-11-09 DIAGNOSIS — I509 Heart failure, unspecified: Secondary | ICD-10-CM | POA: Insufficient documentation

## 2015-11-09 DIAGNOSIS — M6281 Muscle weakness (generalized): Secondary | ICD-10-CM | POA: Diagnosis not present

## 2015-11-09 DIAGNOSIS — I130 Hypertensive heart and chronic kidney disease with heart failure and stage 1 through stage 4 chronic kidney disease, or unspecified chronic kidney disease: Secondary | ICD-10-CM | POA: Diagnosis not present

## 2015-11-09 NOTE — Progress Notes (Signed)
Patient ID: KY CLUM, male   DOB: Dec 29, 1935, 80 y.o.   MRN: [id number]      MRN: [id number] Name: Reginald [name]  Sex: male Age: 80 y.o. DOB: 06/08/1936  [place] #: [facility] Facility/Room: Level Of Care: SNF Provider: Granville Lewis Emergency Contacts: Extended Emergency Contact Information Primary Emergency Contact: [name] Address: [place] Phone: [phone] Relation: Friend  Code Status:   Allergies: Review of patient's allergies indicates no known allergies.  Chief Complaint  Patient presents with  . Discharge Note    HPI: Patient is 80 y.o. male with a complex medical history includingHistory of lower extremity cellulitis coronary artery disease diastolic dysfunction atrial fibrillation.  He is been here essentially for rehabilitation and follow-up of his wounds-wounds apparently have stabilized this will need follow-up certainly as an outpatient.  He also has a history of diastolic CHF which has been stable during his stay here he is on Lasix as well as Lopressor.  He does have history coronary artery disease which is been stable as well he is status post stenting has metoprolol Xarelto and statin.  Patient has gained strength appears to be doing better since he's been here-he is going to another skilled facility.  Currently he has no complaints-.             --    Past Medical History  Diagnosis Date  . Type II or unspecified type diabetes mellitus without mention of complication, not stated as uncontrolled   . Obesity, unspecified   . Unspecified essential hypertension   . Coronary atherosclerosis of unspecified type of vessel, native or graft   . Vestibulopathy   . Hypokalemia   . Atrial fibrillation ([place])   . Rhabdomyolysis   . Hypercholesteremia   . COPD (chronic obstructive pulmonary disease) ([place])   . OSA (obstructive sleep apnea)   . Anteroseptal myocardial infarction ([place])    . Obesity hypoventilation syndrome ([place])   . Hypertensive renal disease   . Chronic kidney disease   . CHF (congestive heart failure) ([place])   . Diverticulosis   . Glaucoma     Past Surgical History  Procedure Laterality Date  . Inguinal hernia repair Right 1985  . Coronary artery bypass graft  1997    PCI to LAD  . Nose surgery  1970s    Per Dr. Terrence Dupont [facility] in pt chart  . Knee arthroscopy Left 1991  . Lumbar fusion    . Appendectomy  1985  . Cholecystectomy  06/2000      Medication List       This list is accurate as of: 11/09/15 11:59 PM.  Always use your most recent med list.               ACIDOPHILUS PROBIOTIC 10 MG Tabs  Take 10 mg by mouth 3 (three) times daily.     atorvastatin 20 MG tablet  Commonly known as:  LIPITOR  Take 20 mg by mouth daily at 6 PM.     cholecalciferol 1000 units tablet  Commonly known as:  VITAMIN D  Take 1,000 Units by mouth daily.     collagenase ointment  Commonly known as:  SANTYL  Apply topically daily.     furosemide 40 MG tablet  Commonly known as:  LASIX  Take 40 mg by mouth.     HYDROcodone-acetaminophen 5-325 MG tablet  Commonly known as:  NORCO  Take  1-2 tablets by mouth every 6 (six) hours as needed for moderate pain or severe pain.     latanoprost 0.005 % ophthalmic solution  Commonly known as:  XALATAN  Place 1 drop into both eyes at bedtime.     Magnesium 400 MG Tabs  Take 400 mg by mouth daily.     metoprolol tartrate 25 MG tablet  Commonly known as:  LOPRESSOR  Take 1 tablet (25 mg total) by mouth 2 (two) times daily.     nitroGLYCERIN 0.4 MG SL tablet  Commonly known as:  NITROSTAT  Place 0.4 mg under the tongue every 5 (five) minutes as needed for chest pain.     potassium chloride SA 20 MEQ tablet  Commonly known as:  K-DUR,KLOR-CON  Take 60 mEq by mouth daily.     Probiotic 250 MG Caps  Take 250 mg by mouth 2 (two) times daily.     saxagliptin HCl 5 MG Tabs tablet  Commonly known as:   ONGLYZA  Take 5 mg by mouth daily.     tamsulosin 0.4 MG Caps capsule  Commonly known as:  FLOMAX  Take 1 capsule (0.4 mg total) by mouth daily.     XARELTO 15 MG Tabs tablet  Generic drug:  Rivaroxaban  Take 15 mg by mouth daily with supper.            Social History  Substance Use Topics  . Smoking status: Never Smoker   . Smokeless tobacco: Never Used  . Alcohol Use: No    Review of Systems  DATA OBTAINED: from patient, nurse GENERAL:  no fevers, appetite changes SKIN: No itching, rash--has completed treatment for lower extremity cellulitis --continues to be followed by wound care HEENT: Is not really complaing of nasal drainage or congestion or sore throat RESPIRATORY:  No shortness of breath apparently cough has improved CARDIAC: No chest pain, palpitations, baseline lower extremity venous stasis changes-- edema --right lower leg is wrapped has venous stasis changes visible on the left leg I do not see signs of cellulitis however warmth or increased erythema GI: No abdominal pain, No N/V/D or constipation, No heartburn or reflux  GU: No dysuria, frequency or urgency, or incontinence--prevous hematuria appears to have resolved  MUSCULOSKELETAL: No unrelieved bone/joint pain NEUROLOGIC: No headache, dizziness  PSYCHIATRIC: No overt anxiety or sadness  Filed Vitals:   11/09/15 2106  BP: 124/70  Pulse: 62  Temp: 97.6 F (36.4 C)  Resp: 16    Physical Exam  GENERAL APPEARANCE: Alert, conversant, No acute distress  Skin is warm and dry    Oropharynx clear mucous membranes moist.  Eyes I could not really appreciate any drainage or discharge visual acuity appears intact RESPIRATORY: Breathing is even, unlabored. Lung sounds are clear   CARDIOVASCULAR: irregular irregular rate and rhythm without murmur gallop or rub lower extremities are currently wrapped GASTROINTESTINAL: Abdomen is soft, non-tender, obese w/ normal bowel sounds.   MUSCULOSKELETAL: No  abnormal joints or musculature -is able to move all extremities 4--ambulates largely in a wheelchair - NEUROLOGIC: Cranial nerves 2-12 grossly intact. Moves all extremities--no focal deficits-his speech is clear PSYCHIATRIC: Mood and affect appropriate to situation, no behavioral issuesContinues to be pleasant and appropriate  Patient Active Problem List   Diagnosis Date Noted  . Abnormal laboratory test result 10/15/2015  . Cough 10/08/2015  . Hematuria 09/21/2015  . Hypokalemia 08/13/2015  . HTN (hypertension) 08/13/2015  . Multiple open wounds of lower leg 07/22/2015  . CKD (chronic kidney  disease) stage 4, GFR 15-29 ml/min (HCC) 07/22/2015  . Cellulitis of leg, right 06/28/2015  . ARF (acute renal failure) ([place]) 06/28/2015  . Hypertensive heart disease with CHF (congestive heart failure) ([place]) 05/19/2015  . CAD (coronary artery disease), native coronary artery 05/19/2015  . Hyperlipidemia 05/19/2015  . Vitamin D deficiency 05/19/2015  . Pressure ulcer 05/16/2015  . Diastolic dysfunction with chronic heart failure ([place]) 05/08/2015  . Blisters of multiple sites 05/08/2015  . Type 2 diabetes mellitus ([place])   . Obesity, unspecified   . OSA (obstructive sleep apnea)   . Atrial fibrillation ([place])   . Secondary DM with CKD stage 4 and hypertension ([place])   . Glaucoma   . Cellulitis 05/07/2015   Labs  10/14/2015.  Sodium 142 potassium 4.2 BUN 30 creatinine 1.5 2  10/13/2015.  Sodium 137 potassium 7.6 BUN 30 creatinine 1.65.--Specimen was hemolyzed  Of note update labs shows potassium actually was 4.2 per discussion with Dr. Sheppard Coil  09/22/2015.  WBC 9.7 hemoglobin 13.8 platelets 279.  Sodium 142 potassium 4.0 BUN 26 creatinine 1.52  09/08/2015.  Sodium 142 potassium 4.2 BUN 31 creatinine 1.9 to.  09/03/2015.  WBC 9.1 hemoglobin 13.3 platelets 349.  Sodium 140 potassium 4 BUN 29 creatinine 1.72.    08/27/2015.  Sodium 142 potassium 3.3 BUN 31 creatinine  1.7-CO2 level is 30.  I do note on February 10 potassium was 4.0 and on February 7 was 3.6 it was as low as 3.2 on February 2 .  08/13/2015.  Sodium 144 potassium 3.2 BUN 22 creatinine 1.5 CO2 30.  Magnesium 1.7.  08/12/2015.  Sodium 143 potassium 3.3 BUN 15 creatinine 1.4.  Creatinine previously was 1.8 on January 30.  WBC 10.7 hemoglobin 14.2 platelets 292.  08/09/1998 1017.  Sodium 144 potassium 3.3 BUN 22 creatinine 1.8.  Albumin 3.1 otherwise liver function tests within normal limits.  TSH 3.8.  Hemoglobin A1c 5.6 CBC    Component Value Date/Time   WBC 14.6* 05/18/2015 0844   RBC 4.19* 05/18/2015 0844   HGB 11.9* 05/18/2015 0844   HCT 37.2* 05/18/2015 0844   PLT 454* 05/18/2015 0844   MCV 88.8 05/18/2015 0844   LYMPHSABS 1.4 05/07/2015 2237   MONOABS 1.2* 05/07/2015 2237   EOSABS 0.2 05/07/2015 2237   BASOSABS 0.0 05/07/2015 2237    CMP     Component Value Date/Time   NA 144 05/18/2015 0844   K 4.3 05/18/2015 0844   CL 107 05/18/2015 0844   CO2 25 05/18/2015 0844   GLUCOSE 170* 05/18/2015 0844   BUN 53* 05/18/2015 0844   CREATININE 1.99* 05/18/2015 0844   CALCIUM 8.9 05/18/2015 0844   PROT 5.5* 05/08/2015 0435   ALBUMIN 2.9* 05/08/2015 0435   AST 18 05/08/2015 0435   ALT 21 05/08/2015 0435   ALKPHOS 58 05/08/2015 0435   BILITOT 1.0 05/08/2015 0435   GFRNONAA 31* 05/18/2015 0844   GFRAA 36* 05/18/2015 0844    Assessment and Plan   History of diastolic CHF-this appears to be stable on current medications including Lasix with potassium supplementation he is on a beta blocker-.  He does have some history of hypokalemia but potassium has been increased and this appears to have stabilized.  History coronary artery disease he continues on statin Xarelto and Lopressor this is been asymptomatic during his stay here.  #3 history hypertension this appears to be relatively stable recent blood pressures 124/70-137/84 he is on metoprolol 25 mg twice a  day and Lasix 40 mg a  day.  #4 history of lower extremity cellulitis this has been quite significant in the past as been treated previously with antibiotics and intensive wound care he will need continued expedient wound care at the new facility.  #5 history of anemia of chronic disease his peers stable most recent hemoglobin 13.0.  #6 history hyperlipidemia he continues on a statin.  #7 history of atrial fibrillation this appears rate controlled on Lopressor he is on Xarelto for anticoagulation.  #8 history of chronic kidney disease most recent creatinine 1.52 on lab done April 5 shows stability follow-up as needed at new facility.  #9 history of degenerative joint disease he does receive Norco as needed for pain this appears stable still continues with significant debility with history of morbid obesity.  At this point patient appears stable but does have numerous complex issues as noted above he will need expedient wound care at the new facility-.  [id number] note greater than 30 minutes spent on this discharge summary-greater than 50% of time spent coordinating plan a for numerous diagnoses            [facility] Lewis,

## 2015-11-10 DIAGNOSIS — R2681 Unsteadiness on feet: Secondary | ICD-10-CM | POA: Diagnosis not present

## 2015-11-10 DIAGNOSIS — M6281 Muscle weakness (generalized): Secondary | ICD-10-CM | POA: Diagnosis not present

## 2015-11-11 DIAGNOSIS — I2510 Atherosclerotic heart disease of native coronary artery without angina pectoris: Secondary | ICD-10-CM | POA: Diagnosis not present

## 2015-11-11 DIAGNOSIS — L0390 Cellulitis, unspecified: Secondary | ICD-10-CM | POA: Diagnosis not present

## 2015-11-11 DIAGNOSIS — I509 Heart failure, unspecified: Secondary | ICD-10-CM | POA: Diagnosis not present

## 2015-11-11 DIAGNOSIS — R2681 Unsteadiness on feet: Secondary | ICD-10-CM | POA: Diagnosis not present

## 2015-11-11 DIAGNOSIS — M6281 Muscle weakness (generalized): Secondary | ICD-10-CM | POA: Diagnosis not present

## 2015-11-12 DIAGNOSIS — E119 Type 2 diabetes mellitus without complications: Secondary | ICD-10-CM | POA: Diagnosis not present

## 2015-11-12 DIAGNOSIS — M6281 Muscle weakness (generalized): Secondary | ICD-10-CM | POA: Diagnosis not present

## 2015-11-12 DIAGNOSIS — R2681 Unsteadiness on feet: Secondary | ICD-10-CM | POA: Diagnosis not present

## 2015-11-12 DIAGNOSIS — Z760 Encounter for issue of repeat prescription: Secondary | ICD-10-CM | POA: Diagnosis not present

## 2015-11-12 DIAGNOSIS — I2510 Atherosclerotic heart disease of native coronary artery without angina pectoris: Secondary | ICD-10-CM | POA: Diagnosis not present

## 2015-11-12 DIAGNOSIS — I10 Essential (primary) hypertension: Secondary | ICD-10-CM | POA: Diagnosis not present

## 2015-11-13 ENCOUNTER — Encounter: Payer: Self-pay | Admitting: Vascular Surgery

## 2015-11-13 DIAGNOSIS — R262 Difficulty in walking, not elsewhere classified: Secondary | ICD-10-CM | POA: Diagnosis not present

## 2015-11-13 DIAGNOSIS — M6281 Muscle weakness (generalized): Secondary | ICD-10-CM | POA: Diagnosis not present

## 2015-11-13 DIAGNOSIS — M79604 Pain in right leg: Secondary | ICD-10-CM | POA: Diagnosis not present

## 2015-11-13 DIAGNOSIS — R2681 Unsteadiness on feet: Secondary | ICD-10-CM | POA: Diagnosis not present

## 2015-11-13 DIAGNOSIS — L0390 Cellulitis, unspecified: Secondary | ICD-10-CM | POA: Diagnosis not present

## 2015-11-16 DIAGNOSIS — M6281 Muscle weakness (generalized): Secondary | ICD-10-CM | POA: Diagnosis not present

## 2015-11-16 DIAGNOSIS — R2681 Unsteadiness on feet: Secondary | ICD-10-CM | POA: Diagnosis not present

## 2015-11-17 DIAGNOSIS — R2681 Unsteadiness on feet: Secondary | ICD-10-CM | POA: Diagnosis not present

## 2015-11-17 DIAGNOSIS — M6281 Muscle weakness (generalized): Secondary | ICD-10-CM | POA: Diagnosis not present

## 2015-11-18 DIAGNOSIS — R2681 Unsteadiness on feet: Secondary | ICD-10-CM | POA: Diagnosis not present

## 2015-11-18 DIAGNOSIS — M6281 Muscle weakness (generalized): Secondary | ICD-10-CM | POA: Diagnosis not present

## 2015-11-19 ENCOUNTER — Encounter: Payer: Self-pay | Admitting: Vascular Surgery

## 2015-11-19 ENCOUNTER — Ambulatory Visit (INDEPENDENT_AMBULATORY_CARE_PROVIDER_SITE_OTHER): Payer: Medicare Other | Admitting: Vascular Surgery

## 2015-11-19 VITALS — BP 110/67 | HR 59 | Ht 70.0 in | Wt 260.0 lb

## 2015-11-19 DIAGNOSIS — I70239 Atherosclerosis of native arteries of right leg with ulceration of unspecified site: Secondary | ICD-10-CM

## 2015-11-19 DIAGNOSIS — I83029 Varicose veins of left lower extremity with ulcer of unspecified site: Secondary | ICD-10-CM

## 2015-11-19 DIAGNOSIS — I70249 Atherosclerosis of native arteries of left leg with ulceration of unspecified site: Secondary | ICD-10-CM | POA: Diagnosis not present

## 2015-11-19 DIAGNOSIS — I83019 Varicose veins of right lower extremity with ulcer of unspecified site: Secondary | ICD-10-CM | POA: Diagnosis not present

## 2015-11-19 DIAGNOSIS — R2681 Unsteadiness on feet: Secondary | ICD-10-CM | POA: Diagnosis not present

## 2015-11-19 DIAGNOSIS — M6281 Muscle weakness (generalized): Secondary | ICD-10-CM | POA: Diagnosis not present

## 2015-11-19 DIAGNOSIS — IMO0001 Reserved for inherently not codable concepts without codable children: Secondary | ICD-10-CM

## 2015-11-19 NOTE — Progress Notes (Signed)
[facility] [place] [facility] HISTORY AND PHYSICAL    Referring Physician: Dr Netta Neat History of Present Illness:  Patient is a 80 y.o. male who presents for follow-up after 4 weeks of inability therapy to heal ulcers on his lower extremities.. The patient has chronic leg swelling. He occasionally develops ulcers in the pretibial region. These have healed spontaneously in the past. The pretibial ulcers are healed but recently had a lateral malleolus ulcer on the right leg. The patient is minimally ambulatory and does transfer with a walker. He currently resides at a skilled nursing facility due to deconditioning after recent hospitalization. At the time of his hospitalization he did have some renal dysfunction but his creatinine has essentially return to normal at this point.  Other medical problems include diabetes, obesity, hypertension, coronary artery disease, atrial fibrillation, elevated cholesterol, COPD and CHF all of which are currently stable. He is on Xarelto for his atrial fibrillation.  Recent CT angiogram showed no significant aortoiliac disease but he did have bilateral moderate popliteal stenosis  Venous duplex ultrasound dated May 1 showed bilateral deep vein reflux with no significant superficial venous reflux    Past Medical History    Diagnosis   Date    .   Type II or unspecified type diabetes mellitus without mention of complication, not stated as uncontrolled       .   Obesity, unspecified       .   Unspecified essential hypertension       .   Coronary atherosclerosis of unspecified type of vessel, native or graft       .   Vestibulopathy       .   Hypokalemia       .   Atrial fibrillation ([place])       .   Rhabdomyolysis       .   Hypercholesteremia       .   COPD (chronic obstructive pulmonary disease) ([place])       .   OSA (obstructive sleep apnea)       .   Anteroseptal myocardial infarction ([place])       .   Obesity hypoventilation syndrome ([place])       .    Hypertensive renal disease       .   Chronic kidney disease       .   CHF (congestive heart failure) ([place])       .   Diverticulosis       .   Glaucoma           Past Surgical History    Procedure   Laterality   Date    .   Inguinal hernia repair   Right   1985    .   Coronary artery bypass graft      1997          PCI to LAD    .   Nose surgery      1970s          Per Dr. Terrence Dupont [facility] in pt chart    .   Knee arthroscopy   Left   1991    .   Lumbar fusion          .   Appendectomy      1985    .   Cholecystectomy      06/2000      Social History Social History    Substance  Use Topics    .   Smoking status:   Never Smoker     .   Smokeless tobacco:   Never Used    .   Alcohol Use:   No      Family History Family History    Problem   Relation   Age of Onset    .   COPD   Mother       .   Diabetes   Father       .   Diabetes   Sister       .   Heart disease   Brother       .   Breast cancer   Maternal Aunt       .   Diabetes   Paternal Grandmother       .   Colon cancer   Maternal Aunt       .   Ovarian cancer   Daughter         Allergies  No Known Allergies     Current Outpatient Prescriptions    Medication   Sig   Dispense   Refill    .   atorvastatin (LIPITOR) 20 MG tablet   Take 20 mg by mouth daily at 6 PM.           .   cholecalciferol (VITAMIN D) 1000 UNITS tablet   Take 1,000 Units by mouth daily.          .   ciprofloxacin (CIPRO) 250 MG tablet   Take 250 mg by mouth 2 (two) times daily.          .   furosemide (LASIX) 40 MG tablet   Take 40 mg by mouth.          Marland Kitchen   HYDROcodone-acetaminophen (NORCO) 5-325 MG tablet   Take 1-2 tablets by mouth every 6 (six) hours as needed for moderate pain or severe pain.   30 tablet   0    .   Lactobacillus (ACIDOPHILUS PROBIOTIC) 10 MG TABS   Take 10 mg by mouth 3 (three) times daily. (Patient taking differently: Take 10 mg by mouth at bedtime. )   90 tablet   0    .   latanoprost (XALATAN) 0.005 % ophthalmic solution    Place 1 drop into both eyes at bedtime.          .   Magnesium 400 MG TABS   Take 400 mg by mouth daily.          .   metoprolol tartrate (LOPRESSOR) 25 MG tablet   Take 1 tablet (25 mg total) by mouth 2 (two) times daily.   60 tablet   3    .   nitroGLYCERIN (NITROSTAT) 0.4 MG SL tablet   Place 0.4 mg under the tongue every 5 (five) minutes as needed for chest pain.          .   potassium chloride SA (K-DUR,KLOR-CON) 20 MEQ tablet   Take 40 mEq by mouth daily.          .   Saccharomyces boulardii (PROBIOTIC) 250 MG CAPS   Take 250 mg by mouth 2 (two) times daily.          .   saxagliptin HCl (ONGLYZA) 5 MG TABS tablet   Take 5 mg by mouth daily.          Alveda Reasons 15 MG TABS tablet  Take 15 mg by mouth daily with supper.           Marland Kitchen   amiodarone (PACERONE) 200 MG tablet   Take 200 mg by mouth at bedtime.           .   collagenase (SANTYL) ointment   Apply topically daily.   15 g   0    .   tamsulosin (FLOMAX) 0.4 MG CAPS capsule   Take 1 capsule (0.4 mg total) by mouth daily.   30 capsule   3       No current facility-administered medications for this visit.      ROS:    General:  No weight loss, Fever, chills  HEENT: No recent headaches, no nasal bleeding, no visual changes, no sore throat  Neurologic: No dizziness, blackouts, seizures. No recent symptoms of stroke or mini- stroke. No recent episodes of slurred speech, or temporary blindness.  Cardiac: No recent episodes of chest pain/pressure, no shortness of breath at rest.  + shortness of breath with exertion. + history of atrial fibrillation or irregular heartbeat  Vascular: No history of rest pain in feet.  No history of claudication.  + history of non-healing ulcer, No history of DVT    Pulmonary: No home oxygen, no productive cough, no hemoptysis,  No asthma or wheezing  Musculoskeletal:  [ ]  Arthritis, [ ]  Low back pain,  [x ] Joint pain  Hematologic:No history of hypercoagulable state.  No history of easy bleeding.  No  history of anemia  Gastrointestinal: No hematochezia or melena,  No gastroesophageal reflux, no trouble swallowing  Urinary: [ x] chronic Kidney disease, [ ]  on HD - [ ]  MWF or [ ]  TTHS, [ ]  Burning with urination, [ ]  Frequent urination, [ ]  Difficulty urinating;    Skin: No rashes  Psychological: No history of anxiety,  No history of depression   Physical Examination     Filed Vitals:   11/19/15 1337  BP: 110/67  Pulse: 59  Height: 5\' 10"  (1.778 m)  Weight: 260 lb (117.935 kg)  SpO2: 95%    General:  Alert and oriented, no acute distress HEENT: Normal Skin: No rash, Right leg ulceration completely healed some edema in both lower extremities and brawny staining few scattered abrasions of skin left leg Musculoskeletal: No deformity 1+ edema bilaterally            Neurologic: Upper and lower extremity motor 5/5 and symmetric  DATA:  ABI reviewed on the right was 0.95 left was 0.93 but vessels were calcified suggesting artificial increase. No significant aortoiliac disease but he did have bilateral moderate popliteal stenosis on his recent CT Angio  ASSESSMENT:  Most likely patient has mixed pathology with element of venous hypertension from his obesity and congestive failure as well as some element of arterial occlusive disease. His ulcer has healed at this point although he does have still some scattered thin scan areas on both legs. We'll place him in an Unna boot today can stay on for 1 more week. He can then switched ACE wraps on both lower extremities. I believe the mainstay of therapy for him is going to be ulcer prevention through compression therapy. He is reluctant to use compression stockings because he has difficulty getting these on a Place wraps work for him.   He will follow up with [place] on as-needed basis.  PLAN:  See above  Ruta Hinds, MD [facility] [place] Office: [phone] Pager:  [phone]  

## 2015-11-20 DIAGNOSIS — R262 Difficulty in walking, not elsewhere classified: Secondary | ICD-10-CM | POA: Diagnosis not present

## 2015-11-20 DIAGNOSIS — R2681 Unsteadiness on feet: Secondary | ICD-10-CM | POA: Diagnosis not present

## 2015-11-20 DIAGNOSIS — M6281 Muscle weakness (generalized): Secondary | ICD-10-CM | POA: Diagnosis not present

## 2015-11-23 DIAGNOSIS — M6281 Muscle weakness (generalized): Secondary | ICD-10-CM | POA: Diagnosis not present

## 2015-11-23 DIAGNOSIS — R2681 Unsteadiness on feet: Secondary | ICD-10-CM | POA: Diagnosis not present

## 2015-11-24 DIAGNOSIS — R2681 Unsteadiness on feet: Secondary | ICD-10-CM | POA: Diagnosis not present

## 2015-11-24 DIAGNOSIS — M6281 Muscle weakness (generalized): Secondary | ICD-10-CM | POA: Diagnosis not present

## 2015-11-25 DIAGNOSIS — M6281 Muscle weakness (generalized): Secondary | ICD-10-CM | POA: Diagnosis not present

## 2015-11-25 DIAGNOSIS — R2681 Unsteadiness on feet: Secondary | ICD-10-CM | POA: Diagnosis not present

## 2015-11-26 DIAGNOSIS — R2681 Unsteadiness on feet: Secondary | ICD-10-CM | POA: Diagnosis not present

## 2015-11-26 DIAGNOSIS — M6281 Muscle weakness (generalized): Secondary | ICD-10-CM | POA: Diagnosis not present

## 2015-11-27 DIAGNOSIS — M6281 Muscle weakness (generalized): Secondary | ICD-10-CM | POA: Diagnosis not present

## 2015-11-27 DIAGNOSIS — R2681 Unsteadiness on feet: Secondary | ICD-10-CM | POA: Diagnosis not present

## 2015-11-30 DIAGNOSIS — M6281 Muscle weakness (generalized): Secondary | ICD-10-CM | POA: Diagnosis not present

## 2015-11-30 DIAGNOSIS — R2681 Unsteadiness on feet: Secondary | ICD-10-CM | POA: Diagnosis not present

## 2015-12-01 DIAGNOSIS — M6281 Muscle weakness (generalized): Secondary | ICD-10-CM | POA: Diagnosis not present

## 2015-12-01 DIAGNOSIS — R2681 Unsteadiness on feet: Secondary | ICD-10-CM | POA: Diagnosis not present

## 2015-12-02 DIAGNOSIS — M6281 Muscle weakness (generalized): Secondary | ICD-10-CM | POA: Diagnosis not present

## 2015-12-02 DIAGNOSIS — R2681 Unsteadiness on feet: Secondary | ICD-10-CM | POA: Diagnosis not present

## 2015-12-03 DIAGNOSIS — M6281 Muscle weakness (generalized): Secondary | ICD-10-CM | POA: Diagnosis not present

## 2015-12-03 DIAGNOSIS — R2681 Unsteadiness on feet: Secondary | ICD-10-CM | POA: Diagnosis not present

## 2015-12-23 DIAGNOSIS — I10 Essential (primary) hypertension: Secondary | ICD-10-CM | POA: Diagnosis not present

## 2015-12-23 DIAGNOSIS — R600 Localized edema: Secondary | ICD-10-CM | POA: Diagnosis not present

## 2015-12-23 DIAGNOSIS — I4891 Unspecified atrial fibrillation: Secondary | ICD-10-CM | POA: Diagnosis not present

## 2015-12-23 DIAGNOSIS — E119 Type 2 diabetes mellitus without complications: Secondary | ICD-10-CM | POA: Diagnosis not present

## 2016-01-01 DIAGNOSIS — D649 Anemia, unspecified: Secondary | ICD-10-CM | POA: Diagnosis not present

## 2016-01-01 DIAGNOSIS — I2510 Atherosclerotic heart disease of native coronary artery without angina pectoris: Secondary | ICD-10-CM | POA: Diagnosis not present

## 2016-01-01 DIAGNOSIS — N189 Chronic kidney disease, unspecified: Secondary | ICD-10-CM | POA: Diagnosis not present

## 2016-01-01 DIAGNOSIS — M1990 Unspecified osteoarthritis, unspecified site: Secondary | ICD-10-CM | POA: Diagnosis not present

## 2016-01-01 DIAGNOSIS — N400 Enlarged prostate without lower urinary tract symptoms: Secondary | ICD-10-CM | POA: Diagnosis not present

## 2016-01-01 DIAGNOSIS — E785 Hyperlipidemia, unspecified: Secondary | ICD-10-CM | POA: Diagnosis not present

## 2016-01-01 DIAGNOSIS — E119 Type 2 diabetes mellitus without complications: Secondary | ICD-10-CM | POA: Diagnosis not present

## 2016-01-01 DIAGNOSIS — I129 Hypertensive chronic kidney disease with stage 1 through stage 4 chronic kidney disease, or unspecified chronic kidney disease: Secondary | ICD-10-CM | POA: Diagnosis not present

## 2016-01-01 DIAGNOSIS — I482 Chronic atrial fibrillation: Secondary | ICD-10-CM | POA: Diagnosis not present

## 2016-01-01 DIAGNOSIS — G4733 Obstructive sleep apnea (adult) (pediatric): Secondary | ICD-10-CM | POA: Diagnosis not present

## 2016-01-06 DIAGNOSIS — I10 Essential (primary) hypertension: Secondary | ICD-10-CM | POA: Diagnosis not present

## 2016-01-06 DIAGNOSIS — I2510 Atherosclerotic heart disease of native coronary artery without angina pectoris: Secondary | ICD-10-CM | POA: Diagnosis not present

## 2016-01-06 DIAGNOSIS — E119 Type 2 diabetes mellitus without complications: Secondary | ICD-10-CM | POA: Diagnosis not present

## 2016-01-06 DIAGNOSIS — I4891 Unspecified atrial fibrillation: Secondary | ICD-10-CM | POA: Diagnosis not present

## 2016-01-15 DIAGNOSIS — I10 Essential (primary) hypertension: Secondary | ICD-10-CM | POA: Diagnosis not present

## 2016-01-15 DIAGNOSIS — R21 Rash and other nonspecific skin eruption: Secondary | ICD-10-CM | POA: Diagnosis not present

## 2016-01-15 DIAGNOSIS — I2510 Atherosclerotic heart disease of native coronary artery without angina pectoris: Secondary | ICD-10-CM | POA: Diagnosis not present

## 2016-01-15 DIAGNOSIS — I4891 Unspecified atrial fibrillation: Secondary | ICD-10-CM | POA: Diagnosis not present

## 2016-02-02 DIAGNOSIS — R2681 Unsteadiness on feet: Secondary | ICD-10-CM | POA: Diagnosis not present

## 2016-02-02 DIAGNOSIS — M6281 Muscle weakness (generalized): Secondary | ICD-10-CM | POA: Diagnosis not present

## 2016-02-02 DIAGNOSIS — M150 Primary generalized (osteo)arthritis: Secondary | ICD-10-CM | POA: Diagnosis not present

## 2016-02-03 DIAGNOSIS — R2681 Unsteadiness on feet: Secondary | ICD-10-CM | POA: Diagnosis not present

## 2016-02-03 DIAGNOSIS — M6281 Muscle weakness (generalized): Secondary | ICD-10-CM | POA: Diagnosis not present

## 2016-02-03 DIAGNOSIS — M150 Primary generalized (osteo)arthritis: Secondary | ICD-10-CM | POA: Diagnosis not present

## 2016-02-04 DIAGNOSIS — R2681 Unsteadiness on feet: Secondary | ICD-10-CM | POA: Diagnosis not present

## 2016-02-04 DIAGNOSIS — M6281 Muscle weakness (generalized): Secondary | ICD-10-CM | POA: Diagnosis not present

## 2016-02-04 DIAGNOSIS — M150 Primary generalized (osteo)arthritis: Secondary | ICD-10-CM | POA: Diagnosis not present

## 2016-02-05 DIAGNOSIS — R2681 Unsteadiness on feet: Secondary | ICD-10-CM | POA: Diagnosis not present

## 2016-02-05 DIAGNOSIS — M150 Primary generalized (osteo)arthritis: Secondary | ICD-10-CM | POA: Diagnosis not present

## 2016-02-05 DIAGNOSIS — M6281 Muscle weakness (generalized): Secondary | ICD-10-CM | POA: Diagnosis not present

## 2016-02-08 DIAGNOSIS — M150 Primary generalized (osteo)arthritis: Secondary | ICD-10-CM | POA: Diagnosis not present

## 2016-02-08 DIAGNOSIS — R2681 Unsteadiness on feet: Secondary | ICD-10-CM | POA: Diagnosis not present

## 2016-02-08 DIAGNOSIS — M6281 Muscle weakness (generalized): Secondary | ICD-10-CM | POA: Diagnosis not present

## 2016-02-09 DIAGNOSIS — M150 Primary generalized (osteo)arthritis: Secondary | ICD-10-CM | POA: Diagnosis not present

## 2016-02-09 DIAGNOSIS — L03115 Cellulitis of right lower limb: Secondary | ICD-10-CM | POA: Diagnosis not present

## 2016-02-09 DIAGNOSIS — R2681 Unsteadiness on feet: Secondary | ICD-10-CM | POA: Diagnosis not present

## 2016-02-09 DIAGNOSIS — M6281 Muscle weakness (generalized): Secondary | ICD-10-CM | POA: Diagnosis not present

## 2016-02-10 DIAGNOSIS — M150 Primary generalized (osteo)arthritis: Secondary | ICD-10-CM | POA: Diagnosis not present

## 2016-02-10 DIAGNOSIS — R2681 Unsteadiness on feet: Secondary | ICD-10-CM | POA: Diagnosis not present

## 2016-02-10 DIAGNOSIS — L03115 Cellulitis of right lower limb: Secondary | ICD-10-CM | POA: Diagnosis not present

## 2016-02-10 DIAGNOSIS — M6281 Muscle weakness (generalized): Secondary | ICD-10-CM | POA: Diagnosis not present

## 2016-02-11 DIAGNOSIS — R2681 Unsteadiness on feet: Secondary | ICD-10-CM | POA: Diagnosis not present

## 2016-02-11 DIAGNOSIS — I4891 Unspecified atrial fibrillation: Secondary | ICD-10-CM | POA: Diagnosis not present

## 2016-02-11 DIAGNOSIS — M150 Primary generalized (osteo)arthritis: Secondary | ICD-10-CM | POA: Diagnosis not present

## 2016-02-11 DIAGNOSIS — I10 Essential (primary) hypertension: Secondary | ICD-10-CM | POA: Diagnosis not present

## 2016-02-11 DIAGNOSIS — M6281 Muscle weakness (generalized): Secondary | ICD-10-CM | POA: Diagnosis not present

## 2016-02-11 DIAGNOSIS — E119 Type 2 diabetes mellitus without complications: Secondary | ICD-10-CM | POA: Diagnosis not present

## 2016-02-11 DIAGNOSIS — I2510 Atherosclerotic heart disease of native coronary artery without angina pectoris: Secondary | ICD-10-CM | POA: Diagnosis not present

## 2016-02-11 DIAGNOSIS — L03115 Cellulitis of right lower limb: Secondary | ICD-10-CM | POA: Diagnosis not present

## 2016-02-12 DIAGNOSIS — L03115 Cellulitis of right lower limb: Secondary | ICD-10-CM | POA: Diagnosis not present

## 2016-02-12 DIAGNOSIS — M150 Primary generalized (osteo)arthritis: Secondary | ICD-10-CM | POA: Diagnosis not present

## 2016-02-12 DIAGNOSIS — R2681 Unsteadiness on feet: Secondary | ICD-10-CM | POA: Diagnosis not present

## 2016-02-12 DIAGNOSIS — M6281 Muscle weakness (generalized): Secondary | ICD-10-CM | POA: Diagnosis not present

## 2016-02-17 DIAGNOSIS — Z79899 Other long term (current) drug therapy: Secondary | ICD-10-CM | POA: Diagnosis not present

## 2016-02-19 DIAGNOSIS — L03031 Cellulitis of right toe: Secondary | ICD-10-CM | POA: Diagnosis not present

## 2016-02-19 DIAGNOSIS — E119 Type 2 diabetes mellitus without complications: Secondary | ICD-10-CM | POA: Diagnosis not present

## 2016-03-21 DIAGNOSIS — Z79899 Other long term (current) drug therapy: Secondary | ICD-10-CM | POA: Diagnosis not present

## 2016-03-31 DIAGNOSIS — I4891 Unspecified atrial fibrillation: Secondary | ICD-10-CM | POA: Diagnosis not present

## 2016-03-31 DIAGNOSIS — E119 Type 2 diabetes mellitus without complications: Secondary | ICD-10-CM | POA: Diagnosis not present

## 2016-03-31 DIAGNOSIS — L0390 Cellulitis, unspecified: Secondary | ICD-10-CM | POA: Diagnosis not present

## 2016-03-31 DIAGNOSIS — J449 Chronic obstructive pulmonary disease, unspecified: Secondary | ICD-10-CM | POA: Diagnosis not present

## 2016-04-02 DIAGNOSIS — I130 Hypertensive heart and chronic kidney disease with heart failure and stage 1 through stage 4 chronic kidney disease, or unspecified chronic kidney disease: Secondary | ICD-10-CM | POA: Diagnosis not present

## 2016-04-02 DIAGNOSIS — I5032 Chronic diastolic (congestive) heart failure: Secondary | ICD-10-CM | POA: Diagnosis not present

## 2016-04-04 DIAGNOSIS — I5032 Chronic diastolic (congestive) heart failure: Secondary | ICD-10-CM | POA: Diagnosis not present

## 2016-04-04 DIAGNOSIS — I130 Hypertensive heart and chronic kidney disease with heart failure and stage 1 through stage 4 chronic kidney disease, or unspecified chronic kidney disease: Secondary | ICD-10-CM | POA: Diagnosis not present

## 2016-04-05 DIAGNOSIS — I5032 Chronic diastolic (congestive) heart failure: Secondary | ICD-10-CM | POA: Diagnosis not present

## 2016-04-05 DIAGNOSIS — E785 Hyperlipidemia, unspecified: Secondary | ICD-10-CM | POA: Diagnosis not present

## 2016-04-05 DIAGNOSIS — I110 Hypertensive heart disease with heart failure: Secondary | ICD-10-CM | POA: Diagnosis not present

## 2016-04-05 DIAGNOSIS — E1322 Other specified diabetes mellitus with diabetic chronic kidney disease: Secondary | ICD-10-CM | POA: Diagnosis not present

## 2016-04-05 DIAGNOSIS — Z6841 Body Mass Index (BMI) 40.0 and over, adult: Secondary | ICD-10-CM | POA: Diagnosis not present

## 2016-04-05 DIAGNOSIS — I4891 Unspecified atrial fibrillation: Secondary | ICD-10-CM | POA: Diagnosis not present

## 2016-04-05 DIAGNOSIS — G4733 Obstructive sleep apnea (adult) (pediatric): Secondary | ICD-10-CM | POA: Diagnosis not present

## 2016-04-05 DIAGNOSIS — I2510 Atherosclerotic heart disease of native coronary artery without angina pectoris: Secondary | ICD-10-CM | POA: Diagnosis not present

## 2016-04-05 DIAGNOSIS — Z1389 Encounter for screening for other disorder: Secondary | ICD-10-CM | POA: Diagnosis not present

## 2016-04-05 DIAGNOSIS — I130 Hypertensive heart and chronic kidney disease with heart failure and stage 1 through stage 4 chronic kidney disease, or unspecified chronic kidney disease: Secondary | ICD-10-CM | POA: Diagnosis not present

## 2016-04-05 DIAGNOSIS — Z9181 History of falling: Secondary | ICD-10-CM | POA: Diagnosis not present

## 2016-04-06 DIAGNOSIS — I5032 Chronic diastolic (congestive) heart failure: Secondary | ICD-10-CM | POA: Diagnosis not present

## 2016-04-06 DIAGNOSIS — I130 Hypertensive heart and chronic kidney disease with heart failure and stage 1 through stage 4 chronic kidney disease, or unspecified chronic kidney disease: Secondary | ICD-10-CM | POA: Diagnosis not present

## 2016-04-07 DIAGNOSIS — I5032 Chronic diastolic (congestive) heart failure: Secondary | ICD-10-CM | POA: Diagnosis not present

## 2016-04-07 DIAGNOSIS — I130 Hypertensive heart and chronic kidney disease with heart failure and stage 1 through stage 4 chronic kidney disease, or unspecified chronic kidney disease: Secondary | ICD-10-CM | POA: Diagnosis not present

## 2016-04-08 DIAGNOSIS — I5032 Chronic diastolic (congestive) heart failure: Secondary | ICD-10-CM | POA: Diagnosis not present

## 2016-04-08 DIAGNOSIS — I130 Hypertensive heart and chronic kidney disease with heart failure and stage 1 through stage 4 chronic kidney disease, or unspecified chronic kidney disease: Secondary | ICD-10-CM | POA: Diagnosis not present

## 2016-04-11 DIAGNOSIS — I5032 Chronic diastolic (congestive) heart failure: Secondary | ICD-10-CM | POA: Diagnosis not present

## 2016-04-11 DIAGNOSIS — I130 Hypertensive heart and chronic kidney disease with heart failure and stage 1 through stage 4 chronic kidney disease, or unspecified chronic kidney disease: Secondary | ICD-10-CM | POA: Diagnosis not present

## 2016-04-12 DIAGNOSIS — I130 Hypertensive heart and chronic kidney disease with heart failure and stage 1 through stage 4 chronic kidney disease, or unspecified chronic kidney disease: Secondary | ICD-10-CM | POA: Diagnosis not present

## 2016-04-12 DIAGNOSIS — I5032 Chronic diastolic (congestive) heart failure: Secondary | ICD-10-CM | POA: Diagnosis not present

## 2016-04-13 DIAGNOSIS — I5032 Chronic diastolic (congestive) heart failure: Secondary | ICD-10-CM | POA: Diagnosis not present

## 2016-04-13 DIAGNOSIS — I130 Hypertensive heart and chronic kidney disease with heart failure and stage 1 through stage 4 chronic kidney disease, or unspecified chronic kidney disease: Secondary | ICD-10-CM | POA: Diagnosis not present

## 2016-04-14 DIAGNOSIS — I130 Hypertensive heart and chronic kidney disease with heart failure and stage 1 through stage 4 chronic kidney disease, or unspecified chronic kidney disease: Secondary | ICD-10-CM | POA: Diagnosis not present

## 2016-04-14 DIAGNOSIS — I5032 Chronic diastolic (congestive) heart failure: Secondary | ICD-10-CM | POA: Diagnosis not present

## 2016-04-18 DIAGNOSIS — I5032 Chronic diastolic (congestive) heart failure: Secondary | ICD-10-CM | POA: Diagnosis not present

## 2016-04-18 DIAGNOSIS — I130 Hypertensive heart and chronic kidney disease with heart failure and stage 1 through stage 4 chronic kidney disease, or unspecified chronic kidney disease: Secondary | ICD-10-CM | POA: Diagnosis not present

## 2016-04-19 DIAGNOSIS — L570 Actinic keratosis: Secondary | ICD-10-CM | POA: Diagnosis not present

## 2016-04-19 DIAGNOSIS — E559 Vitamin D deficiency, unspecified: Secondary | ICD-10-CM | POA: Diagnosis not present

## 2016-04-19 DIAGNOSIS — Z6841 Body Mass Index (BMI) 40.0 and over, adult: Secondary | ICD-10-CM | POA: Diagnosis not present

## 2016-04-19 DIAGNOSIS — I110 Hypertensive heart disease with heart failure: Secondary | ICD-10-CM | POA: Diagnosis not present

## 2016-04-19 DIAGNOSIS — E785 Hyperlipidemia, unspecified: Secondary | ICD-10-CM | POA: Diagnosis not present

## 2016-04-19 DIAGNOSIS — E11622 Type 2 diabetes mellitus with other skin ulcer: Secondary | ICD-10-CM | POA: Diagnosis not present

## 2016-04-20 DIAGNOSIS — I5032 Chronic diastolic (congestive) heart failure: Secondary | ICD-10-CM | POA: Diagnosis not present

## 2016-04-20 DIAGNOSIS — I130 Hypertensive heart and chronic kidney disease with heart failure and stage 1 through stage 4 chronic kidney disease, or unspecified chronic kidney disease: Secondary | ICD-10-CM | POA: Diagnosis not present

## 2016-04-22 DIAGNOSIS — I130 Hypertensive heart and chronic kidney disease with heart failure and stage 1 through stage 4 chronic kidney disease, or unspecified chronic kidney disease: Secondary | ICD-10-CM | POA: Diagnosis not present

## 2016-04-22 DIAGNOSIS — I5032 Chronic diastolic (congestive) heart failure: Secondary | ICD-10-CM | POA: Diagnosis not present

## 2016-04-25 DIAGNOSIS — I130 Hypertensive heart and chronic kidney disease with heart failure and stage 1 through stage 4 chronic kidney disease, or unspecified chronic kidney disease: Secondary | ICD-10-CM | POA: Diagnosis not present

## 2016-04-25 DIAGNOSIS — I5032 Chronic diastolic (congestive) heart failure: Secondary | ICD-10-CM | POA: Diagnosis not present

## 2016-04-27 DIAGNOSIS — I130 Hypertensive heart and chronic kidney disease with heart failure and stage 1 through stage 4 chronic kidney disease, or unspecified chronic kidney disease: Secondary | ICD-10-CM | POA: Diagnosis not present

## 2016-04-27 DIAGNOSIS — I5032 Chronic diastolic (congestive) heart failure: Secondary | ICD-10-CM | POA: Diagnosis not present

## 2016-05-02 ENCOUNTER — Encounter: Payer: Self-pay | Admitting: Cardiovascular Disease

## 2016-05-02 ENCOUNTER — Ambulatory Visit (INDEPENDENT_AMBULATORY_CARE_PROVIDER_SITE_OTHER): Payer: Medicare Other | Admitting: Cardiovascular Disease

## 2016-05-02 VITALS — BP 120/78 | HR 68 | Ht 69.0 in | Wt 298.8 lb

## 2016-05-02 DIAGNOSIS — I890 Lymphedema, not elsewhere classified: Secondary | ICD-10-CM | POA: Insufficient documentation

## 2016-05-02 DIAGNOSIS — E1122 Type 2 diabetes mellitus with diabetic chronic kidney disease: Secondary | ICD-10-CM

## 2016-05-02 DIAGNOSIS — I25118 Atherosclerotic heart disease of native coronary artery with other forms of angina pectoris: Secondary | ICD-10-CM

## 2016-05-02 DIAGNOSIS — E7800 Pure hypercholesterolemia, unspecified: Secondary | ICD-10-CM

## 2016-05-02 DIAGNOSIS — I4891 Unspecified atrial fibrillation: Secondary | ICD-10-CM

## 2016-05-02 DIAGNOSIS — I110 Hypertensive heart disease with heart failure: Secondary | ICD-10-CM

## 2016-05-02 DIAGNOSIS — N183 Chronic kidney disease, stage 3 (moderate): Secondary | ICD-10-CM

## 2016-05-02 DIAGNOSIS — E1322 Other specified diabetes mellitus with diabetic chronic kidney disease: Secondary | ICD-10-CM | POA: Diagnosis not present

## 2016-05-02 DIAGNOSIS — I129 Hypertensive chronic kidney disease with stage 1 through stage 4 chronic kidney disease, or unspecified chronic kidney disease: Secondary | ICD-10-CM

## 2016-05-02 DIAGNOSIS — N184 Chronic kidney disease, stage 4 (severe): Secondary | ICD-10-CM

## 2016-05-02 NOTE — Patient Instructions (Addendum)
Medication Instructions:   Please move the lasix to 8 AM and 2 pm  Labwork:  No new labs needed  Testing/Procedures:  No further testing at this time  ACE wraps , lymphedema compression pumps, Leg elevation, TEd hose with a zipper   Follow-Up: It was a pleasure seeing you in the office today. Please call us if you have new issues that need to be addressed before your next appt.  [phone]  Your physician wants you to follow-up in: 6 months.  You will receive a reminder letter in the mail two months in advance. If you don't receive a letter, please call our office to schedule the follow-up appointment.  If you need a refill on your cardiac medications before your next appointment, please call your pharmacy.

## 2016-05-02 NOTE — Progress Notes (Signed)
Cardiology Office Note  Date:  05/02/2016   ID:  Jah, Alarid 05-11-1936, MRN [id number]  PCP:  Philmore Pali, NP   Chief Complaint  Patient presents with  . other    Ref by Dr. Chauncy Passy to establish care for history of CAD & A-Fib. Meds reviewed by the pt's med list.     HPI:  Mr. Londo Is a 80 year old gentleman with long history of lymphedema,  History of lower extremity ulcers,  diabetes, obesity, hypertension, coronary artery disease, atrial fibrillation (on anticoagulation), hyperlipidemia, COPD, CHF PAD, Bilateral moderate popliteal stenosis (moderate to severe left popliteal artery stenosis) History of PCI to LAD over 10 years ago  Chronic renal insufficiency in the setting of outflow tract obstruction November 2016 creatinine up to 4 Who presents by referral from Dr. Terrence Dupont to establish care in the [facility] office for leg swelling, coronary disease  Long history of leg swelling, ulcerations Previous severe ulceration right lower extremity healed after wound wraps placed Reports that lymphedema compression boots have been ordered, not scheduled to arrive for at least one month. He does not use any compression hose or Ace wraps, only leg elevation Recently noted blistering bilateral lower extremities Recently completed doxycycline for lower extremity cellulitis  Takes Lasix morning and early evening, bothered by urination overnight, frequent interruptions to his sleep.  EKG shows atrial fibrillation with ventricular rate 68 bpm, poor R-wave progression to the anterior precordial leads unable to exclude old anterior MI  Previous studies reviewed with him Echocardiogram June 2011 showing normal LV function, right ventricular systolic pressure 40 ABIs done January 2017 0.9 bilaterally  Stress test 2/ 2012, mild fixed defect anterior wall, ejection fraction 58%  CT scan lower extremities March 2017 Bilateral outflow disease, moderate to severe stenosis distal left  popliteal artery, Coronary calcifications noted Extensive subcutaneous edema  PMH:   has a past medical history of Anteroseptal myocardial infarction ([place]); Atrial fibrillation ([place]); CHF (congestive heart failure) ([place]); Chronic kidney disease; COPD (chronic obstructive pulmonary disease) ([place]); Coronary atherosclerosis of unspecified type of vessel, native or graft; Diverticulosis; Glaucoma; Hypercholesteremia; Hypertensive renal disease; Hypokalemia; Obesity hypoventilation syndrome ([place]); Obesity, unspecified; OSA (obstructive sleep apnea); Rhabdomyolysis; Type II or unspecified type diabetes mellitus without mention of complication, not stated as uncontrolled; Unspecified essential hypertension; and Vestibulopathy.  PSH:    Past Surgical History:  Procedure Laterality Date  . APPENDECTOMY  1985  . CARDIAC CATHETERIZATION    . CHOLECYSTECTOMY  06/2000  . CORONARY ANGIOPLASTY  08/14/1995   stent placement to LAD   . INGUINAL HERNIA REPAIR Right 1985  . KNEE ARTHROSCOPY Left 1991  . LUMBAR FUSION    . NOSE SURGERY  1970s   Per Dr. Terrence Dupont [facility] in pt chart    Current Outpatient Prescriptions  Medication Sig Dispense Refill  . atorvastatin (LIPITOR) 20 MG tablet Take 20 mg by mouth daily at 6 PM.     . cholecalciferol (VITAMIN D) 1000 UNITS tablet Take 1,000 Units by mouth daily.    . collagenase (SANTYL) ointment Apply topically daily. 15 g 0  . furosemide (LASIX) 40 MG tablet Take 40 mg by mouth.    Marland Kitchen HYDROcodone-acetaminophen (NORCO) 5-325 MG tablet Take 1-2 tablets by mouth every 6 (six) hours as needed for moderate pain or severe pain. 30 tablet 0  . Lactobacillus (ACIDOPHILUS PROBIOTIC) 10 MG TABS Take 10 mg by mouth 3 (three) times daily. (Patient taking differently: Take 10 mg by mouth at bedtime. ) 90 tablet 0  .  latanoprost (XALATAN) 0.005 % ophthalmic solution Place 1 drop into both eyes at bedtime.    . Magnesium 400 MG TABS Take 400 mg by mouth daily.    . metoprolol  tartrate (LOPRESSOR) 25 MG tablet Take 1 tablet (25 mg total) by mouth 2 (two) times daily. (Patient taking differently: Take 12.5 mg by mouth 2 (two) times daily. ) 60 tablet 3  . nitroGLYCERIN (NITROSTAT) 0.4 MG SL tablet Place 0.4 mg under the tongue every 5 (five) minutes as needed for chest pain.    . potassium chloride SA (K-DUR,KLOR-CON) 20 MEQ tablet Take 60 mEq by mouth daily.     . Saccharomyces boulardii (PROBIOTIC) 250 MG CAPS Take 250 mg by mouth 2 (two) times daily.    . saxagliptin HCl (ONGLYZA) 5 MG TABS tablet Take 5 mg by mouth daily.    . tamsulosin (FLOMAX) 0.4 MG CAPS capsule Take 1 capsule (0.4 mg total) by mouth daily. 30 capsule 3  . XARELTO 15 MG TABS tablet Take 15 mg by mouth daily with supper.      No current facility-administered medications for this visit.      Allergies:   Review of patient's allergies indicates no known allergies.   Social History:  The patient  reports that he has never smoked. He has never used smokeless tobacco. He reports that he does not drink alcohol or use drugs.   Family History:   family history includes Breast cancer in his maternal aunt; COPD in his mother; Colon cancer in his maternal aunt; Diabetes in his father, paternal grandmother, and sister; Heart disease in his brother; Ovarian cancer in his daughter.    Review of Systems: Review of Systems  Respiratory: Negative.   Cardiovascular: Positive for leg swelling.  Gastrointestinal: Negative.   Musculoskeletal: Negative.        Gait instability  Neurological: Positive for weakness.  Psychiatric/Behavioral: Negative.   All other systems reviewed and are negative.    PHYSICAL EXAM: VS:  BP 120/78 (BP Location: Left Arm, Patient Position: Sitting, Cuff Size: Large)   Pulse 68   Ht 5\' 9"  (1.753 m)   Wt 298 lb 12 oz (135.5 kg)   BMI 44.12 kg/m  , BMI Body mass index is 44.12 kg/m. GEN: Well nourished, well developed, in no acute distress, obese  HEENT: normal  Neck: no  JVD, carotid bruits, or masses Cardiac: Irregularly irregular, no murmurs, rubs, or gallops,2+ pitting edema, chronic stasis skin changes Respiratory:  clear to auscultation bilaterally, normal work of breathing GI: soft, nontender, nondistended, + BS MS: no deformity or atrophy  Skin: warm and dry, no rash Neuro:  Strength and sensation are intact Psych: euthymic mood, full affect    Recent Labs: 05/08/2015: ALT 21 05/16/2015: Magnesium 2.6 05/18/2015: BUN 53; Creatinine, Ser 1.99; Hemoglobin 11.9; Platelets 454; Potassium 4.3; Sodium 144    Lipid Panel Lab Results  Component Value Date   CHOL  08/12/2010    154        ATP III CLASSIFICATION:  <200     mg/dL   Desirable  200-239  mg/dL   Borderline High  >=240    mg/dL   High          HDL 53 08/12/2010   LDLCALC  08/12/2010    81        Total Cholesterol/HDL:CHD Risk Coronary Heart Disease Risk Table  Men   Women  1/2 Average Risk   3.4   3.3  Average Risk       5.0   4.4  2 X Average Risk   9.6   7.1  3 X Average Risk  23.4   11.0        Use the calculated Patient Ratio above and the CHD Risk Table to determine the patient's CHD Risk.        ATP III CLASSIFICATION (LDL):  <100     mg/dL   Optimal  100-129  mg/dL   Near or Above                    Optimal  130-159  mg/dL   Borderline  160-189  mg/dL   High  >190     mg/dL   Very High   TRIG 101 08/12/2010      Wt Readings from Last 3 Encounters:  05/02/16 298 lb 12 oz (135.5 kg)  11/19/15 260 lb (117.9 kg)  10/14/15 260 lb (117.9 kg)       ASSESSMENT AND PLAN:  Atrial fibrillation, unspecified type ([place]) - Plan: EKG 12-Lead Currently on anticoagulation, rate well controlled on metoprolol  Coronary artery disease involving native coronary artery of native heart with other form of angina pectoris ([place]) - Plan: EKG 12-Lead Currently with no symptoms of angina. No further workup at this time. Continue current medication  regimen.  Hypertensive heart disease with CHF (congestive heart failure) ([place])  Secondary DM with CKD stage 4 and hypertension ([place])  Pure hypercholesterolemia Cholesterol is at goal on the current lipid regimen. No changes to the medications were made.  Type 2 diabetes mellitus with stage 3 chronic kidney disease, unspecified long term insulin use status (HCC) A1c well controlled Recommended weight loss  CKD (chronic kidney disease) stage 4, GFR 15-29 ml/min (HCC) Improved renal function, creatinine 1.5 in October 2017 Avoid NSAIDs  Morbid obesity We have encouraged careful diet management in an effort to lose weight.  Lymphedema Most of his visit was spent discussing his leg swelling Recommended lymphedema compression pumps, Ace wraps, leg elevation Once leg size improves, would try compression hose with zipper   Total encounter time more than 45 minutes  Greater than 50% was spent in counseling and coordination of care with the patient   Disposition:   F/U  6 months   Orders Placed This Encounter  Procedures  . EKG 12-Lead     Signed, Esmond Plants, M.D., Ph.D. 05/02/2016  [place], [place]

## 2016-05-13 DIAGNOSIS — L918 Other hypertrophic disorders of the skin: Secondary | ICD-10-CM | POA: Diagnosis not present

## 2016-05-20 ENCOUNTER — Ambulatory Visit: Payer: Medicare Other | Admitting: Internal Medicine

## 2016-05-24 DIAGNOSIS — D539 Nutritional anemia, unspecified: Secondary | ICD-10-CM | POA: Diagnosis not present

## 2016-05-24 DIAGNOSIS — Z125 Encounter for screening for malignant neoplasm of prostate: Secondary | ICD-10-CM | POA: Diagnosis not present

## 2016-05-24 DIAGNOSIS — Z0000 Encounter for general adult medical examination without abnormal findings: Secondary | ICD-10-CM | POA: Diagnosis not present

## 2016-05-24 DIAGNOSIS — M129 Arthropathy, unspecified: Secondary | ICD-10-CM | POA: Diagnosis not present

## 2016-05-24 DIAGNOSIS — I739 Peripheral vascular disease, unspecified: Secondary | ICD-10-CM | POA: Diagnosis not present

## 2016-05-24 DIAGNOSIS — E1165 Type 2 diabetes mellitus with hyperglycemia: Secondary | ICD-10-CM | POA: Diagnosis not present

## 2016-05-24 DIAGNOSIS — E559 Vitamin D deficiency, unspecified: Secondary | ICD-10-CM | POA: Diagnosis not present

## 2016-05-24 DIAGNOSIS — R300 Dysuria: Secondary | ICD-10-CM | POA: Diagnosis not present

## 2016-05-24 DIAGNOSIS — E7800 Pure hypercholesterolemia, unspecified: Secondary | ICD-10-CM | POA: Diagnosis not present

## 2016-05-24 DIAGNOSIS — R5383 Other fatigue: Secondary | ICD-10-CM | POA: Diagnosis not present

## 2016-05-24 DIAGNOSIS — Z79899 Other long term (current) drug therapy: Secondary | ICD-10-CM | POA: Diagnosis not present

## 2016-05-24 DIAGNOSIS — M818 Other osteoporosis without current pathological fracture: Secondary | ICD-10-CM | POA: Diagnosis not present

## 2016-05-24 DIAGNOSIS — E291 Testicular hypofunction: Secondary | ICD-10-CM | POA: Diagnosis not present

## 2016-06-16 DIAGNOSIS — L03119 Cellulitis of unspecified part of limb: Secondary | ICD-10-CM | POA: Diagnosis not present

## 2016-06-16 DIAGNOSIS — Z6841 Body Mass Index (BMI) 40.0 and over, adult: Secondary | ICD-10-CM | POA: Diagnosis not present

## 2016-06-16 DIAGNOSIS — I5032 Chronic diastolic (congestive) heart failure: Secondary | ICD-10-CM | POA: Diagnosis not present

## 2016-06-16 DIAGNOSIS — R269 Unspecified abnormalities of gait and mobility: Secondary | ICD-10-CM | POA: Diagnosis not present

## 2016-06-16 DIAGNOSIS — I890 Lymphedema, not elsewhere classified: Secondary | ICD-10-CM | POA: Diagnosis not present

## 2016-06-16 DIAGNOSIS — I83009 Varicose veins of unspecified lower extremity with ulcer of unspecified site: Secondary | ICD-10-CM | POA: Diagnosis not present

## 2016-06-18 DIAGNOSIS — Z7901 Long term (current) use of anticoagulants: Secondary | ICD-10-CM | POA: Diagnosis not present

## 2016-06-18 DIAGNOSIS — L97819 Non-pressure chronic ulcer of other part of right lower leg with unspecified severity: Secondary | ICD-10-CM | POA: Diagnosis not present

## 2016-06-18 DIAGNOSIS — I5032 Chronic diastolic (congestive) heart failure: Secondary | ICD-10-CM | POA: Diagnosis not present

## 2016-06-18 DIAGNOSIS — H409 Unspecified glaucoma: Secondary | ICD-10-CM | POA: Diagnosis not present

## 2016-06-18 DIAGNOSIS — M26629 Arthralgia of temporomandibular joint, unspecified side: Secondary | ICD-10-CM | POA: Diagnosis not present

## 2016-06-18 DIAGNOSIS — G250 Essential tremor: Secondary | ICD-10-CM | POA: Diagnosis not present

## 2016-06-18 DIAGNOSIS — E1151 Type 2 diabetes mellitus with diabetic peripheral angiopathy without gangrene: Secondary | ICD-10-CM | POA: Diagnosis not present

## 2016-06-18 DIAGNOSIS — L03115 Cellulitis of right lower limb: Secondary | ICD-10-CM | POA: Diagnosis not present

## 2016-06-18 DIAGNOSIS — I110 Hypertensive heart disease with heart failure: Secondary | ICD-10-CM | POA: Diagnosis not present

## 2016-06-18 DIAGNOSIS — M545 Low back pain: Secondary | ICD-10-CM | POA: Diagnosis not present

## 2016-06-18 DIAGNOSIS — I4891 Unspecified atrial fibrillation: Secondary | ICD-10-CM | POA: Diagnosis not present

## 2016-06-18 DIAGNOSIS — E662 Morbid (severe) obesity with alveolar hypoventilation: Secondary | ICD-10-CM | POA: Diagnosis not present

## 2016-06-18 DIAGNOSIS — Z7984 Long term (current) use of oral hypoglycemic drugs: Secondary | ICD-10-CM | POA: Diagnosis not present

## 2016-06-18 DIAGNOSIS — I83018 Varicose veins of right lower extremity with ulcer other part of lower leg: Secondary | ICD-10-CM | POA: Diagnosis not present

## 2016-06-18 DIAGNOSIS — I2510 Atherosclerotic heart disease of native coronary artery without angina pectoris: Secondary | ICD-10-CM | POA: Diagnosis not present

## 2016-06-21 DIAGNOSIS — E1151 Type 2 diabetes mellitus with diabetic peripheral angiopathy without gangrene: Secondary | ICD-10-CM | POA: Diagnosis not present

## 2016-06-21 DIAGNOSIS — I5032 Chronic diastolic (congestive) heart failure: Secondary | ICD-10-CM | POA: Diagnosis not present

## 2016-06-21 DIAGNOSIS — I83018 Varicose veins of right lower extremity with ulcer other part of lower leg: Secondary | ICD-10-CM | POA: Diagnosis not present

## 2016-06-21 DIAGNOSIS — L03115 Cellulitis of right lower limb: Secondary | ICD-10-CM | POA: Diagnosis not present

## 2016-06-21 DIAGNOSIS — L97819 Non-pressure chronic ulcer of other part of right lower leg with unspecified severity: Secondary | ICD-10-CM | POA: Diagnosis not present

## 2016-06-21 DIAGNOSIS — I110 Hypertensive heart disease with heart failure: Secondary | ICD-10-CM | POA: Diagnosis not present

## 2016-06-23 DIAGNOSIS — I83018 Varicose veins of right lower extremity with ulcer other part of lower leg: Secondary | ICD-10-CM | POA: Diagnosis not present

## 2016-06-23 DIAGNOSIS — L97819 Non-pressure chronic ulcer of other part of right lower leg with unspecified severity: Secondary | ICD-10-CM | POA: Diagnosis not present

## 2016-06-23 DIAGNOSIS — L03115 Cellulitis of right lower limb: Secondary | ICD-10-CM | POA: Diagnosis not present

## 2016-06-23 DIAGNOSIS — E1151 Type 2 diabetes mellitus with diabetic peripheral angiopathy without gangrene: Secondary | ICD-10-CM | POA: Diagnosis not present

## 2016-06-23 DIAGNOSIS — I5032 Chronic diastolic (congestive) heart failure: Secondary | ICD-10-CM | POA: Diagnosis not present

## 2016-06-23 DIAGNOSIS — I110 Hypertensive heart disease with heart failure: Secondary | ICD-10-CM | POA: Diagnosis not present

## 2016-06-30 DIAGNOSIS — L97819 Non-pressure chronic ulcer of other part of right lower leg with unspecified severity: Secondary | ICD-10-CM | POA: Diagnosis not present

## 2016-06-30 DIAGNOSIS — E1151 Type 2 diabetes mellitus with diabetic peripheral angiopathy without gangrene: Secondary | ICD-10-CM | POA: Diagnosis not present

## 2016-06-30 DIAGNOSIS — I110 Hypertensive heart disease with heart failure: Secondary | ICD-10-CM | POA: Diagnosis not present

## 2016-06-30 DIAGNOSIS — I5032 Chronic diastolic (congestive) heart failure: Secondary | ICD-10-CM | POA: Diagnosis not present

## 2016-06-30 DIAGNOSIS — L03115 Cellulitis of right lower limb: Secondary | ICD-10-CM | POA: Diagnosis not present

## 2016-06-30 DIAGNOSIS — I83018 Varicose veins of right lower extremity with ulcer other part of lower leg: Secondary | ICD-10-CM | POA: Diagnosis not present

## 2016-07-06 DIAGNOSIS — L97819 Non-pressure chronic ulcer of other part of right lower leg with unspecified severity: Secondary | ICD-10-CM | POA: Diagnosis not present

## 2016-07-06 DIAGNOSIS — I110 Hypertensive heart disease with heart failure: Secondary | ICD-10-CM | POA: Diagnosis not present

## 2016-07-06 DIAGNOSIS — I5032 Chronic diastolic (congestive) heart failure: Secondary | ICD-10-CM | POA: Diagnosis not present

## 2016-07-06 DIAGNOSIS — I83018 Varicose veins of right lower extremity with ulcer other part of lower leg: Secondary | ICD-10-CM | POA: Diagnosis not present

## 2016-07-06 DIAGNOSIS — E1151 Type 2 diabetes mellitus with diabetic peripheral angiopathy without gangrene: Secondary | ICD-10-CM | POA: Diagnosis not present

## 2016-07-06 DIAGNOSIS — L03115 Cellulitis of right lower limb: Secondary | ICD-10-CM | POA: Diagnosis not present

## 2016-07-12 DIAGNOSIS — I110 Hypertensive heart disease with heart failure: Secondary | ICD-10-CM | POA: Diagnosis not present

## 2016-07-12 DIAGNOSIS — I5032 Chronic diastolic (congestive) heart failure: Secondary | ICD-10-CM | POA: Diagnosis not present

## 2016-07-12 DIAGNOSIS — L03115 Cellulitis of right lower limb: Secondary | ICD-10-CM | POA: Diagnosis not present

## 2016-07-12 DIAGNOSIS — L97819 Non-pressure chronic ulcer of other part of right lower leg with unspecified severity: Secondary | ICD-10-CM | POA: Diagnosis not present

## 2016-07-12 DIAGNOSIS — I83018 Varicose veins of right lower extremity with ulcer other part of lower leg: Secondary | ICD-10-CM | POA: Diagnosis not present

## 2016-07-12 DIAGNOSIS — E1151 Type 2 diabetes mellitus with diabetic peripheral angiopathy without gangrene: Secondary | ICD-10-CM | POA: Diagnosis not present

## 2016-08-11 ENCOUNTER — Emergency Department
Admission: EM | Admit: 2016-08-11 | Discharge: 2016-08-11 | Disposition: A | Discharge status: Home / Self Care | Payer: Medicare Other | Attending: Emergency Medicine | Admitting: Emergency Medicine

## 2016-08-11 ENCOUNTER — Encounter: Payer: Self-pay | Admitting: Emergency Medicine

## 2016-08-11 DIAGNOSIS — R609 Edema, unspecified: Secondary | ICD-10-CM

## 2016-08-11 DIAGNOSIS — E1122 Type 2 diabetes mellitus with diabetic chronic kidney disease: Secondary | ICD-10-CM | POA: Diagnosis not present

## 2016-08-11 DIAGNOSIS — I509 Heart failure, unspecified: Secondary | ICD-10-CM | POA: Insufficient documentation

## 2016-08-11 DIAGNOSIS — N184 Chronic kidney disease, stage 4 (severe): Secondary | ICD-10-CM | POA: Diagnosis not present

## 2016-08-11 DIAGNOSIS — L03116 Cellulitis of left lower limb: Secondary | ICD-10-CM | POA: Insufficient documentation

## 2016-08-11 DIAGNOSIS — L03115 Cellulitis of right lower limb: Secondary | ICD-10-CM | POA: Diagnosis not present

## 2016-08-11 DIAGNOSIS — I130 Hypertensive heart and chronic kidney disease with heart failure and stage 1 through stage 4 chronic kidney disease, or unspecified chronic kidney disease: Secondary | ICD-10-CM | POA: Insufficient documentation

## 2016-08-11 DIAGNOSIS — J449 Chronic obstructive pulmonary disease, unspecified: Secondary | ICD-10-CM | POA: Insufficient documentation

## 2016-08-11 DIAGNOSIS — Z79899 Other long term (current) drug therapy: Secondary | ICD-10-CM | POA: Diagnosis not present

## 2016-08-11 DIAGNOSIS — L03119 Cellulitis of unspecified part of limb: Secondary | ICD-10-CM

## 2016-08-11 DIAGNOSIS — R600 Localized edema: Secondary | ICD-10-CM | POA: Diagnosis present

## 2016-08-11 DIAGNOSIS — I2510 Atherosclerotic heart disease of native coronary artery without angina pectoris: Secondary | ICD-10-CM | POA: Diagnosis not present

## 2016-08-11 LAB — COMPREHENSIVE METABOLIC PANEL
ALBUMIN: 3.9 g/dL (ref 3.5–5.0)
ALK PHOS: 83 U/L (ref 38–126)
ALT: 21 U/L (ref 17–63)
AST: 26 U/L (ref 15–41)
Anion gap: 8 (ref 5–15)
BUN: 21 mg/dL — AB (ref 6–20)
CALCIUM: 9.5 mg/dL (ref 8.9–10.3)
CO2: 28 mmol/L (ref 22–32)
Chloride: 105 mmol/L (ref 101–111)
Creatinine, Ser: 1.37 mg/dL — ABNORMAL HIGH (ref 0.61–1.24)
GFR calc Af Amer: 55 mL/min — ABNORMAL LOW (ref >60)
GFR calc non Af Amer: 48 mL/min — ABNORMAL LOW (ref >60)
GLUCOSE: 241 mg/dL — AB (ref 65–99)
Potassium: 4 mmol/L (ref 3.5–5.1)
SODIUM: 141 mmol/L (ref 135–145)
Total Bilirubin: 0.9 mg/dL (ref 0.3–1.2)
Total Protein: 7 g/dL (ref 6.5–8.1)

## 2016-08-11 LAB — GLUCOSE, CAPILLARY: Glucose-Capillary: 232 mg/dL — ABNORMAL HIGH (ref 65–99)

## 2016-08-11 LAB — CBC
HEMATOCRIT: 48.9 % (ref 40.0–52.0)
HEMOGLOBIN: 16.3 g/dL (ref 13.0–18.0)
MCH: 27.1 pg (ref 26.0–34.0)
MCHC: 33.2 g/dL (ref 32.0–36.0)
MCV: 81.6 fL (ref 80.0–100.0)
Platelets: 195 10*3/uL (ref 150–440)
RBC: 5.99 MIL/uL — AB (ref 4.40–5.90)
RDW: 17.5 % — ABNORMAL HIGH (ref 11.5–14.5)
WBC: 6.8 10*3/uL (ref 3.8–10.6)

## 2016-08-11 MED ORDER — VANCOMYCIN HCL IN DEXTROSE 1-5 GM/200ML-% IV SOLN
1000.0000 mg | Freq: Once | INTRAVENOUS | Status: AC
  Administered 2016-08-11: 1000 mg via INTRAVENOUS
  Filled 2016-08-11: qty 200

## 2016-08-11 MED ORDER — CEPHALEXIN 500 MG PO CAPS: 500.0000 mg | ORAL_CAPSULE | Freq: Two times a day (BID) | ORAL | 0 refills | Status: DC

## 2016-08-11 NOTE — ED Provider Notes (Signed)
[facility] Emergency Department Provider Note  Time seen: 1:06 PM  I have reviewed the triage vital signs and the nursing notes.   HISTORY  Chief Complaint Leg Swelling    HPI Reginald [name] is a 81 y.o. male with a past medical history of obesity, chronic lymphedema, CK D, COPD, hypertension, hyperlipidemia, presents to the emergency department for lower extremity weeping. According to the patient for the past one week he has had weeping to the lower extremities. Patient states he has chronic lymphedema and frequently will get ulcerations/cellulitis to the area which is what he believes is occurring currently. Denies any trouble breathing or shortness breath. Denies any fever or nausea or vomiting.  Past Medical History:  Diagnosis Date  . Anteroseptal myocardial infarction ([place])   . Atrial fibrillation ([place])   . CHF (congestive heart failure) ([place])   . Chronic kidney disease   . COPD (chronic obstructive pulmonary disease) ([place])   . Coronary atherosclerosis of unspecified type of vessel, native or graft   . Diverticulosis   . Glaucoma   . Hypercholesteremia   . Hypertensive renal disease   . Hypokalemia   . Obesity hypoventilation syndrome ([place])   . Obesity, unspecified   . OSA (obstructive sleep apnea)   . Rhabdomyolysis   . Type II or unspecified type diabetes mellitus without mention of complication, not stated as uncontrolled   . Unspecified essential hypertension   . Vestibulopathy     Patient Active Problem List   Diagnosis Date Noted  . Morbid obesity ([place]) 05/02/2016  . Lymphedema 05/02/2016  . Abnormal laboratory test result 10/15/2015  . Cough 10/08/2015  . Hematuria 09/21/2015  . Hypokalemia 08/13/2015  . HTN (hypertension) 08/13/2015  . Multiple open wounds of lower leg 07/22/2015  . CKD (chronic kidney disease) stage 4, GFR 15-29 ml/min (HCC) 07/22/2015  . Cellulitis of leg, right 06/28/2015  . ARF (acute renal failure) ([place])  06/28/2015  . Hypertensive heart disease with CHF (congestive heart failure) ([place]) 05/19/2015  . CAD (coronary artery disease), native coronary artery 05/19/2015  . Hyperlipidemia 05/19/2015  . Vitamin D deficiency 05/19/2015  . Pressure ulcer 05/16/2015  . Diastolic dysfunction with chronic heart failure ([place]) 05/08/2015  . Blisters of multiple sites 05/08/2015  . Type 2 diabetes mellitus ([place])   . Obesity, unspecified   . OSA (obstructive sleep apnea)   . Atrial fibrillation ([place])   . Secondary DM with CKD stage 4 and hypertension ([place])   . Glaucoma   . Cellulitis 05/07/2015    Past Surgical History:  Procedure Laterality Date  . APPENDECTOMY  1985  . CARDIAC CATHETERIZATION    . CHOLECYSTECTOMY  06/2000  . CORONARY ANGIOPLASTY  08/14/1995   stent placement to LAD   . INGUINAL HERNIA REPAIR Right 1985  . KNEE ARTHROSCOPY Left 1991  . LUMBAR FUSION    . NOSE SURGERY  1970s   Per Dr. Terrence Dupont [facility] in pt chart    Prior to Admission medications   Medication Sig Start Date End Date Taking? Authorizing Provider  nitroGLYCERIN (NITROSTAT) 0.4 MG SL tablet Place 0.4 mg under the tongue every 5 (five) minutes as needed for chest pain.   Yes Historical Provider, MD  atorvastatin (LIPITOR) 20 MG tablet Take 20 mg by mouth daily at 6 PM.     Historical Provider, MD  cholecalciferol (VITAMIN D) 1000 UNITS tablet Take 1,000 Units by mouth daily.    Historical Provider, MD  collagenase (SANTYL) ointment Apply topically  daily. 05/18/15   Charolette Forward, MD  furosemide (LASIX) 40 MG tablet Take 40 mg by mouth.    Historical Provider, MD  HYDROcodone-acetaminophen (NORCO) 5-325 MG tablet Take 1-2 tablets by mouth every 6 (six) hours as needed for moderate pain or severe pain. 05/06/15   Harvel Quale, MD  Lactobacillus (ACIDOPHILUS PROBIOTIC) 10 MG TABS Take 10 mg by mouth 3 (three) times daily. Patient taking differently: Take 10 mg by mouth at bedtime.  10/31/14   Waynetta Pean, PA-C   latanoprost (XALATAN) 0.005 % ophthalmic solution Place 1 drop into both eyes at bedtime. 02/10/14   Historical Provider, MD  Magnesium 400 MG TABS Take 400 mg by mouth daily.    Historical Provider, MD  metoprolol tartrate (LOPRESSOR) 25 MG tablet Take 1 tablet (25 mg total) by mouth 2 (two) times daily. Patient taking differently: Take 12.5 mg by mouth 2 (two) times daily.  05/18/15   Charolette Forward, MD  potassium chloride SA (K-DUR,KLOR-CON) 20 MEQ tablet Take 60 mEq by mouth daily.     Historical Provider, MD  Saccharomyces boulardii (PROBIOTIC) 250 MG CAPS Take 250 mg by mouth 2 (two) times daily.    Historical Provider, MD  saxagliptin HCl (ONGLYZA) 5 MG TABS tablet Take 5 mg by mouth daily.    Historical Provider, MD  tamsulosin (FLOMAX) 0.4 MG CAPS capsule Take 1 capsule (0.4 mg total) by mouth daily. 05/18/15   Charolette Forward, MD  XARELTO 15 MG TABS tablet Take 15 mg by mouth daily with supper.  06/17/14   Historical Provider, MD    No Known Allergies  Family History  Problem Relation Age of Onset  . COPD Mother   . Diabetes Father   . Diabetes Sister   . Heart disease Brother   . Breast cancer Maternal Aunt   . Diabetes Paternal Grandmother   . Colon cancer Maternal Aunt   . Ovarian cancer Daughter     Social History Social History  Substance Use Topics  . Smoking status: Never Smoker  . Smokeless tobacco: Never Used  . Alcohol use No    Review of Systems Constitutional: Negative for fever. Cardiovascular: Negative for chest pain. Respiratory: Negative for shortness of breath. Gastrointestinal: Negative for abdominal pain Neurological: Negative for headache 10-point ROS otherwise negative.  ____________________________________________   PHYSICAL EXAM:  VITAL SIGNS: ED Triage Vitals  Enc Vitals Group     BP 08/11/16 1115 (!) 144/67     Pulse Rate 08/11/16 1115 61     Resp 08/11/16 1115 18     Temp 08/11/16 1115 98.4 F (36.9 C)     Temp Source 08/11/16 1115  Oral     SpO2 08/11/16 1115 95 %     Weight 08/11/16 1116 300 lb (136.1 kg)     Height 08/11/16 1116 5\' 10"  (1.778 m)     Head Circumference --      Peak Flow --      Pain Score 08/11/16 1116 5     Pain Loc --      Pain Edu? --      Excl. in [place]? --     Constitutional: Alert and oriented. Well appearing and in no distress. Eyes: Normal exam ENT   Head: Normocephalic and atraumatic.   Mouth/Throat: Mucous membranes are moist. Cardiovascular: Normal rate, regular rhythm. Respiratory: Normal respiratory effort without tachypnea nor retractions. Breath sounds are clear  Gastrointestinal: Soft and nontender. No distention.  Musculoskeletal: Significant lower extremity edema which is  fairly equal bilaterally with darkened skin consistent with chronic venous stasis. Patient does have several ulcerated areas which are weeping clear fluid. Neurovascularly intact distally. Neurologic:  Normal speech and language. No gross focal neurologic deficits  Skin:  Skin is warm Psychiatric: Mood and affect are normal.  ____________________________________________    INITIAL IMPRESSION / ASSESSMENT AND PLAN / ED COURSE  Pertinent labs & imaging results that were available during my care of the patient were reviewed by me and considered in my medical decision making (see chart for details).  Patient presents the emergency department with ulcerated skin chronic lymphedema. Left edema appears to be weeping in several spots with mild erythema/darkened skin. It is difficult to discern chronic changes from acute cellulitis. Patient's labs are largely within normal limits including a normal white blood cell count. Given the patient's history of recurrent cellulitis we will cover with antibiotics. We will dose IV vancomycin in the emergency department and discharged with antibiotics to be taken orally. Patient is follow up with his primary care doctor on Monday for recheck.  I filled out a face-to-face  form for the patient and are currently working with case management to arrange a home health aide to help with dressing changes and compression stockings.  ____________________________________________   FINAL CLINICAL IMPRESSION(S) / ED DIAGNOSES  Cellulitis    Harvest Dark, MD 08/11/16 [phone]

## 2016-08-11 NOTE — ED Notes (Signed)
Patient's legs wrapped with non-stick dressing, kerlex, and coban per Dr. Kerman Passey.

## 2016-08-11 NOTE — Care Management Note (Signed)
Case Management Note  Patient Details  Name: Reginald [name] MRN: [id number] Date of Birth: 02-18-1936  Subjective/Objective: Patient requires WOCN and aide per Dr Kerman Passey. He is entering the face to face.  I have called Corene Cornea at [facility] and verified they can take the referral. Patient and family asked for them as they had a good experience previously.                   Action/Plan:   Expected Discharge Date:                  Expected Discharge Plan:     In-House Referral:     Discharge planning Services     Post Acute Care Choice:    Choice offered to:     DME Arranged:    DME Agency:     HH Arranged:    HH Agency:     Status of Service:     If discussed at [employer] of Stay Meetings, dates discussed:    Additional Comments:  Beau Fanny, RN 08/11/2016, 2:21 PM

## 2016-08-11 NOTE — ED Triage Notes (Signed)
Pt to ED from home c/o bilateral leg swelling and weeping blister to left leg since yesterday.  Hx of DBM, cellulitis.

## 2016-08-11 NOTE — Care Management Note (Signed)
Case Management Note  Patient Details  Name: Reginald [name] MRN: [id number] Date of Birth: 04/07/1936  Subjective/Objective:                    Action/Plan:   Expected Discharge Date:                  Expected Discharge Plan:     In-House Referral:     Discharge planning Services     Post Acute Care Choice:    Choice offered to:     DME Arranged:    DME Agency:     HH Arranged:    HH Agency:     Status of Service:     If discussed at [employer] of Stay Meetings, dates discussed:    Additional Comments:  Beau Fanny, RN 08/11/2016, 2:23 PM

## 2016-08-11 NOTE — Discharge Instructions (Signed)
Please take your antibiotics as prescribed for their entire course. Please keep your legs in compression dressings to help with peripheral edema/swelling. Please follow-up with your primary care doctor within the next 3-5 days for recheck/reevaluation. Return to the emergency department for any increased redness, increased swelling or fever.

## 2016-08-12 DIAGNOSIS — E1122 Type 2 diabetes mellitus with diabetic chronic kidney disease: Secondary | ICD-10-CM | POA: Diagnosis not present

## 2016-08-12 DIAGNOSIS — J449 Chronic obstructive pulmonary disease, unspecified: Secondary | ICD-10-CM | POA: Diagnosis not present

## 2016-08-12 DIAGNOSIS — I4891 Unspecified atrial fibrillation: Secondary | ICD-10-CM | POA: Diagnosis not present

## 2016-08-12 DIAGNOSIS — L97822 Non-pressure chronic ulcer of other part of left lower leg with fat layer exposed: Secondary | ICD-10-CM | POA: Diagnosis not present

## 2016-08-12 DIAGNOSIS — G4733 Obstructive sleep apnea (adult) (pediatric): Secondary | ICD-10-CM | POA: Diagnosis not present

## 2016-08-12 DIAGNOSIS — I509 Heart failure, unspecified: Secondary | ICD-10-CM | POA: Diagnosis not present

## 2016-08-12 DIAGNOSIS — L03116 Cellulitis of left lower limb: Secondary | ICD-10-CM | POA: Diagnosis not present

## 2016-08-12 DIAGNOSIS — I130 Hypertensive heart and chronic kidney disease with heart failure and stage 1 through stage 4 chronic kidney disease, or unspecified chronic kidney disease: Secondary | ICD-10-CM | POA: Diagnosis not present

## 2016-08-12 DIAGNOSIS — L03115 Cellulitis of right lower limb: Secondary | ICD-10-CM | POA: Diagnosis not present

## 2016-08-12 DIAGNOSIS — Z6841 Body Mass Index (BMI) 40.0 and over, adult: Secondary | ICD-10-CM | POA: Diagnosis not present

## 2016-08-12 DIAGNOSIS — L97811 Non-pressure chronic ulcer of other part of right lower leg limited to breakdown of skin: Secondary | ICD-10-CM | POA: Diagnosis not present

## 2016-08-12 DIAGNOSIS — Z7984 Long term (current) use of oral hypoglycemic drugs: Secondary | ICD-10-CM | POA: Diagnosis not present

## 2016-08-12 DIAGNOSIS — Z7901 Long term (current) use of anticoagulants: Secondary | ICD-10-CM | POA: Diagnosis not present

## 2016-08-12 DIAGNOSIS — N189 Chronic kidney disease, unspecified: Secondary | ICD-10-CM | POA: Diagnosis not present

## 2016-08-12 DIAGNOSIS — E662 Morbid (severe) obesity with alveolar hypoventilation: Secondary | ICD-10-CM | POA: Diagnosis not present

## 2016-08-12 DIAGNOSIS — Z4800 Encounter for change or removal of nonsurgical wound dressing: Secondary | ICD-10-CM | POA: Diagnosis not present

## 2016-08-12 DIAGNOSIS — I890 Lymphedema, not elsewhere classified: Secondary | ICD-10-CM | POA: Diagnosis not present

## 2016-08-15 DIAGNOSIS — L03116 Cellulitis of left lower limb: Secondary | ICD-10-CM | POA: Diagnosis not present

## 2016-08-15 DIAGNOSIS — E1122 Type 2 diabetes mellitus with diabetic chronic kidney disease: Secondary | ICD-10-CM | POA: Diagnosis not present

## 2016-08-15 DIAGNOSIS — L97811 Non-pressure chronic ulcer of other part of right lower leg limited to breakdown of skin: Secondary | ICD-10-CM | POA: Diagnosis not present

## 2016-08-15 DIAGNOSIS — L97822 Non-pressure chronic ulcer of other part of left lower leg with fat layer exposed: Secondary | ICD-10-CM | POA: Diagnosis not present

## 2016-08-15 DIAGNOSIS — L03115 Cellulitis of right lower limb: Secondary | ICD-10-CM | POA: Diagnosis not present

## 2016-08-15 DIAGNOSIS — I890 Lymphedema, not elsewhere classified: Secondary | ICD-10-CM | POA: Diagnosis not present

## 2016-08-16 DIAGNOSIS — L97811 Non-pressure chronic ulcer of other part of right lower leg limited to breakdown of skin: Secondary | ICD-10-CM | POA: Diagnosis not present

## 2016-08-16 DIAGNOSIS — L03116 Cellulitis of left lower limb: Secondary | ICD-10-CM | POA: Diagnosis not present

## 2016-08-16 DIAGNOSIS — L03115 Cellulitis of right lower limb: Secondary | ICD-10-CM | POA: Diagnosis not present

## 2016-08-16 DIAGNOSIS — L97822 Non-pressure chronic ulcer of other part of left lower leg with fat layer exposed: Secondary | ICD-10-CM | POA: Diagnosis not present

## 2016-08-16 DIAGNOSIS — E1122 Type 2 diabetes mellitus with diabetic chronic kidney disease: Secondary | ICD-10-CM | POA: Diagnosis not present

## 2016-08-16 DIAGNOSIS — I890 Lymphedema, not elsewhere classified: Secondary | ICD-10-CM | POA: Diagnosis not present

## 2016-08-19 DIAGNOSIS — I890 Lymphedema, not elsewhere classified: Secondary | ICD-10-CM | POA: Diagnosis not present

## 2016-08-19 DIAGNOSIS — L03116 Cellulitis of left lower limb: Secondary | ICD-10-CM | POA: Diagnosis not present

## 2016-08-19 DIAGNOSIS — E1122 Type 2 diabetes mellitus with diabetic chronic kidney disease: Secondary | ICD-10-CM | POA: Diagnosis not present

## 2016-08-19 DIAGNOSIS — L97822 Non-pressure chronic ulcer of other part of left lower leg with fat layer exposed: Secondary | ICD-10-CM | POA: Diagnosis not present

## 2016-08-19 DIAGNOSIS — L03115 Cellulitis of right lower limb: Secondary | ICD-10-CM | POA: Diagnosis not present

## 2016-08-19 DIAGNOSIS — L97811 Non-pressure chronic ulcer of other part of right lower leg limited to breakdown of skin: Secondary | ICD-10-CM | POA: Diagnosis not present

## 2016-08-23 DIAGNOSIS — L03116 Cellulitis of left lower limb: Secondary | ICD-10-CM | POA: Diagnosis not present

## 2016-08-23 DIAGNOSIS — I890 Lymphedema, not elsewhere classified: Secondary | ICD-10-CM | POA: Diagnosis not present

## 2016-08-23 DIAGNOSIS — L97811 Non-pressure chronic ulcer of other part of right lower leg limited to breakdown of skin: Secondary | ICD-10-CM | POA: Diagnosis not present

## 2016-08-23 DIAGNOSIS — L03115 Cellulitis of right lower limb: Secondary | ICD-10-CM | POA: Diagnosis not present

## 2016-08-23 DIAGNOSIS — E1122 Type 2 diabetes mellitus with diabetic chronic kidney disease: Secondary | ICD-10-CM | POA: Diagnosis not present

## 2016-08-23 DIAGNOSIS — L97822 Non-pressure chronic ulcer of other part of left lower leg with fat layer exposed: Secondary | ICD-10-CM | POA: Diagnosis not present

## 2016-08-26 DIAGNOSIS — L97811 Non-pressure chronic ulcer of other part of right lower leg limited to breakdown of skin: Secondary | ICD-10-CM | POA: Diagnosis not present

## 2016-08-26 DIAGNOSIS — L97822 Non-pressure chronic ulcer of other part of left lower leg with fat layer exposed: Secondary | ICD-10-CM | POA: Diagnosis not present

## 2016-08-26 DIAGNOSIS — L03116 Cellulitis of left lower limb: Secondary | ICD-10-CM | POA: Diagnosis not present

## 2016-08-26 DIAGNOSIS — I890 Lymphedema, not elsewhere classified: Secondary | ICD-10-CM | POA: Diagnosis not present

## 2016-08-26 DIAGNOSIS — E1122 Type 2 diabetes mellitus with diabetic chronic kidney disease: Secondary | ICD-10-CM | POA: Diagnosis not present

## 2016-08-26 DIAGNOSIS — L03115 Cellulitis of right lower limb: Secondary | ICD-10-CM | POA: Diagnosis not present

## 2016-08-28 ENCOUNTER — Emergency Department: Payer: Medicare Other

## 2016-08-28 ENCOUNTER — Emergency Department
Admission: EM | Admit: 2016-08-28 | Discharge: 2016-08-29 | Disposition: A | Discharge status: Home / Self Care | Payer: Medicare Other | Attending: Emergency Medicine | Admitting: Emergency Medicine

## 2016-08-28 ENCOUNTER — Encounter: Payer: Self-pay | Admitting: Emergency Medicine

## 2016-08-28 DIAGNOSIS — R079 Chest pain, unspecified: Secondary | ICD-10-CM | POA: Insufficient documentation

## 2016-08-28 DIAGNOSIS — R0789 Other chest pain: Secondary | ICD-10-CM | POA: Diagnosis not present

## 2016-08-28 DIAGNOSIS — I5030 Unspecified diastolic (congestive) heart failure: Secondary | ICD-10-CM | POA: Insufficient documentation

## 2016-08-28 DIAGNOSIS — I509 Heart failure, unspecified: Secondary | ICD-10-CM | POA: Insufficient documentation

## 2016-08-28 DIAGNOSIS — I130 Hypertensive heart and chronic kidney disease with heart failure and stage 1 through stage 4 chronic kidney disease, or unspecified chronic kidney disease: Secondary | ICD-10-CM | POA: Insufficient documentation

## 2016-08-28 DIAGNOSIS — J449 Chronic obstructive pulmonary disease, unspecified: Secondary | ICD-10-CM | POA: Diagnosis not present

## 2016-08-28 DIAGNOSIS — E1122 Type 2 diabetes mellitus with diabetic chronic kidney disease: Secondary | ICD-10-CM | POA: Insufficient documentation

## 2016-08-28 DIAGNOSIS — Z79899 Other long term (current) drug therapy: Secondary | ICD-10-CM | POA: Diagnosis not present

## 2016-08-28 DIAGNOSIS — I2510 Atherosclerotic heart disease of native coronary artery without angina pectoris: Secondary | ICD-10-CM | POA: Insufficient documentation

## 2016-08-28 DIAGNOSIS — N184 Chronic kidney disease, stage 4 (severe): Secondary | ICD-10-CM | POA: Insufficient documentation

## 2016-08-28 LAB — CBC
HCT: 47.3 % (ref 40.0–52.0)
HEMOGLOBIN: 15.3 g/dL (ref 13.0–18.0)
MCH: 26.5 pg (ref 26.0–34.0)
MCHC: 32.3 g/dL (ref 32.0–36.0)
MCV: 82 fL (ref 80.0–100.0)
Platelets: 204 10*3/uL (ref 150–440)
RBC: 5.76 MIL/uL (ref 4.40–5.90)
RDW: 17 % — ABNORMAL HIGH (ref 11.5–14.5)
WBC: 9.3 10*3/uL (ref 3.8–10.6)

## 2016-08-28 LAB — BASIC METABOLIC PANEL
ANION GAP: 9 (ref 5–15)
BUN: 23 mg/dL — ABNORMAL HIGH (ref 6–20)
CALCIUM: 9.2 mg/dL (ref 8.9–10.3)
CO2: 29 mmol/L (ref 22–32)
Chloride: 104 mmol/L (ref 101–111)
Creatinine, Ser: 1.33 mg/dL — ABNORMAL HIGH (ref 0.61–1.24)
GFR calc Af Amer: 57 mL/min — ABNORMAL LOW (ref >60)
GFR calc non Af Amer: 50 mL/min — ABNORMAL LOW (ref >60)
GLUCOSE: 152 mg/dL — AB (ref 65–99)
Potassium: 3.6 mmol/L (ref 3.5–5.1)
Sodium: 142 mmol/L (ref 135–145)

## 2016-08-28 LAB — TROPONIN I

## 2016-08-28 NOTE — ED Triage Notes (Addendum)
Pt to ED via EMS from home with c/o CP x few hours, states he felt "tingling all over". Per pt took 1 subling nitro before EMS with no relief. Per EMS pt CP resolved upon arival, EMS gave 324 asprin en route. Pt denies any pain at this time, VS stable.

## 2016-08-28 NOTE — Discharge Instructions (Signed)
Please seek medical attention for any high fevers, chest pain, shortness of breath, change in behavior, persistent vomiting, bloody stool or any other new or concerning symptoms.  

## 2016-08-28 NOTE — ED Provider Notes (Signed)
[facility] Emergency Department Provider Note  ____________________________________________   I have reviewed the triage vital signs and the nursing notes.   HISTORY  Chief Complaint Chest Pain   History limited by: Not Limited   HPI Reginald [name] is a 81 y.o. male who presents to the emergency department today because of concerns for chest pain. The patient states that it was located in the center chest. He describes it as prickly. He states that the pain then went throughout his whole body. Happened shortly after the patient finished some pizza. He states that he thinks that salt caused the pain. He did feel like he had associated high blood pressure when it occurred. At the time of my examination the patient was pain free. Has a history of a heart attack roughly 20 years ago.   Past Medical History:  Diagnosis Date  . Anteroseptal myocardial infarction ([place])   . Atrial fibrillation ([place])   . CHF (congestive heart failure) ([place])   . Chronic kidney disease   . COPD (chronic obstructive pulmonary disease) ([place])   . Coronary atherosclerosis of unspecified type of vessel, native or graft   . Diverticulosis   . Glaucoma   . Hypercholesteremia   . Hypertensive renal disease   . Hypokalemia   . Obesity hypoventilation syndrome ([place])   . Obesity, unspecified   . OSA (obstructive sleep apnea)   . Rhabdomyolysis   . Type II or unspecified type diabetes mellitus without mention of complication, not stated as uncontrolled   . Unspecified essential hypertension   . Vestibulopathy     Patient Active Problem List   Diagnosis Date Noted  . Morbid obesity ([place]) 05/02/2016  . Lymphedema 05/02/2016  . Abnormal laboratory test result 10/15/2015  . Cough 10/08/2015  . Hematuria 09/21/2015  . Hypokalemia 08/13/2015  . HTN (hypertension) 08/13/2015  . Multiple open wounds of lower leg 07/22/2015  . CKD (chronic kidney disease) stage 4, GFR 15-29 ml/min (HCC)  07/22/2015  . Cellulitis of leg, right 06/28/2015  . ARF (acute renal failure) ([place]) 06/28/2015  . Hypertensive heart disease with CHF (congestive heart failure) ([place]) 05/19/2015  . CAD (coronary artery disease), native coronary artery 05/19/2015  . Hyperlipidemia 05/19/2015  . Vitamin D deficiency 05/19/2015  . Pressure ulcer 05/16/2015  . Diastolic dysfunction with chronic heart failure ([place]) 05/08/2015  . Blisters of multiple sites 05/08/2015  . Type 2 diabetes mellitus ([place])   . Obesity, unspecified   . OSA (obstructive sleep apnea)   . Atrial fibrillation ([place])   . Secondary DM with CKD stage 4 and hypertension ([place])   . Glaucoma   . Cellulitis 05/07/2015    Past Surgical History:  Procedure Laterality Date  . APPENDECTOMY  1985  . CARDIAC CATHETERIZATION    . CHOLECYSTECTOMY  06/2000  . CORONARY ANGIOPLASTY  08/14/1995   stent placement to LAD   . INGUINAL HERNIA REPAIR Right 1985  . KNEE ARTHROSCOPY Left 1991  . LUMBAR FUSION    . NOSE SURGERY  1970s   Per Dr. Terrence Dupont [facility] in pt chart    Prior to Admission medications   Medication Sig Start Date End Date Taking? Authorizing Provider  atorvastatin (LIPITOR) 20 MG tablet Take 20 mg by mouth daily at 6 PM.     Historical Provider, MD  cephALEXin (KEFLEX) 500 MG capsule Take 1 capsule (500 mg total) by mouth 2 (two) times daily. 08/11/16   Harvest Dark, MD  cholecalciferol (VITAMIN D) 1000 UNITS tablet  Take 1,000 Units by mouth daily.    Historical Provider, MD  collagenase (SANTYL) ointment Apply topically daily. 05/18/15   Charolette Forward, MD  furosemide (LASIX) 40 MG tablet Take 40 mg by mouth 2 (two) times daily. 40 mg in the morning and 40 mg at 2 pm.    Historical Provider, MD  HYDROcodone-acetaminophen (NORCO) 5-325 MG tablet Take 1-2 tablets by mouth every 6 (six) hours as needed for moderate pain or severe pain. Patient not taking: Reported on 08/11/2016 05/06/15   Harvel Quale, MD  Lactobacillus (ACIDOPHILUS  PROBIOTIC) 10 MG TABS Take 10 mg by mouth 3 (three) times daily. Patient taking differently: Take 10 mg by mouth at bedtime.  10/31/14   Waynetta Pean, PA-C  latanoprost (XALATAN) 0.005 % ophthalmic solution Place 1 drop into both eyes at bedtime. 02/10/14   Historical Provider, MD  Magnesium 400 MG TABS Take 400 mg by mouth daily.    Historical Provider, MD  metoprolol tartrate (LOPRESSOR) 25 MG tablet Take 1 tablet (25 mg total) by mouth 2 (two) times daily. 05/18/15   Charolette Forward, MD  nitroGLYCERIN (NITROSTAT) 0.4 MG SL tablet Place 0.4 mg under the tongue every 5 (five) minutes as needed for chest pain.    Historical Provider, MD  potassium chloride SA (K-DUR,KLOR-CON) 20 MEQ tablet Take 60 mEq by mouth daily.     Historical Provider, MD  Saccharomyces boulardii (PROBIOTIC) 250 MG CAPS Take 250 mg by mouth 2 (two) times daily.    Historical Provider, MD  saxagliptin HCl (ONGLYZA) 5 MG TABS tablet Take 5 mg by mouth daily.    Historical Provider, MD  tamsulosin (FLOMAX) 0.4 MG CAPS capsule Take 1 capsule (0.4 mg total) by mouth daily. Patient not taking: Reported on 08/11/2016 05/18/15   Charolette Forward, MD  vitamin C (ASCORBIC ACID) 500 MG tablet Take 1,000 mg by mouth daily.    Historical Provider, MD  XARELTO 15 MG TABS tablet Take 15 mg by mouth daily with supper.  06/17/14   Historical Provider, MD    Allergies Adhesive [tape]  Family History  Problem Relation Age of Onset  . COPD Mother   . Diabetes Father   . Diabetes Sister   . Heart disease Brother   . Breast cancer Maternal Aunt   . Diabetes Paternal Grandmother   . Colon cancer Maternal Aunt   . Ovarian cancer Daughter     Social History Social History  Substance Use Topics  . Smoking status: Never Smoker  . Smokeless tobacco: Never Used  . Alcohol use No    Review of Systems  Constitutional: Negative for fever. Cardiovascular: Positive for chest pain. Respiratory: Negative for shortness of breath. Gastrointestinal:  Negative for abdominal pain, vomiting and diarrhea. Neurological: Negative for headaches, focal weakness or numbness.  10-point ROS otherwise negative.  ____________________________________________   PHYSICAL EXAM:  VITAL SIGNS: ED Triage Vitals  Enc Vitals Group     BP 08/28/16 2040 (!) 156/91     Pulse Rate 08/28/16 2031 75     Resp 08/28/16 2028 16     Temp 08/28/16 2031 98 F (36.7 C)     Temp Source 08/28/16 2028 Oral     SpO2 08/28/16 2031 95 %     Weight --      Height --      Head Circumference --      Peak Flow --      Pain Score 08/28/16 2029 0   Constitutional: Alert and oriented.  Well appearing and in no distress. Eyes: Conjunctivae are normal. Normal extraocular movements. ENT   Head: Normocephalic and atraumatic.   Nose: No congestion/rhinnorhea.   Mouth/Throat: Mucous membranes are moist.   Neck: No stridor. Hematological/Lymphatic/Immunilogical: No cervical lymphadenopathy. Cardiovascular: Normal rate, regular rhythm.  No murmurs, rubs, or gallops.  Respiratory: Normal respiratory effort without tachypnea nor retractions. Breath sounds are clear and equal bilaterally. No wheezes/rales/rhonchi. Gastrointestinal: Soft and non tender. No rebound. No guarding.  Genitourinary: Deferred Musculoskeletal: Normal range of motion in all extremities. No lower extremity edema. Neurologic:  Normal speech and language. No gross focal neurologic deficits are appreciated.  Skin:  Skin is warm, dry and intact. No rash noted. Psychiatric: Mood and affect are normal. Speech and behavior are normal. Patient exhibits appropriate insight and judgment.  ____________________________________________    LABS (pertinent positives/negatives)  Labs Reviewed  BASIC METABOLIC PANEL - Abnormal; Notable for the following:       Result Value   Glucose, Bld 152 (*)    BUN 23 (*)    Creatinine, Ser 1.33 (*)    GFR calc non Af Amer 50 (*)    GFR calc Af Amer 57 (*)     All other components within normal limits  CBC - Abnormal; Notable for the following:    RDW 17.0 (*)    All other components within normal limits  TROPONIN I  TROPONIN I   2nd trop pending at time of sign out  ____________________________________________   EKG  I, Nance Pear, attending physician, personally viewed and interpreted this EKG  EKG Time: 2032 Rate: 83 Rhythm: atrial fibrillation Axis: normal Intervals: qtc 499 QRS: narrow, q waves V1, V2 ST changes: no st elevation Impression: abnormal ekg   ____________________________________________    RADIOLOGY  CXR IMPRESSION:  No acute cardiopulmonary abnormality.     ____________________________________________   PROCEDURES  Procedures  ____________________________________________   INITIAL IMPRESSION / ASSESSMENT AND PLAN / ED COURSE  Pertinent labs & imaging results that were available during my care of the patient were reviewed by me and considered in my medical decision making (see chart for details).  Patient presented to the emergency department today for an episode of chest pain. Initial troponin negative.Marland Kitchen EKG without any acute findings. Discussed with patient and do wonder could potentially be related to more heartburn gastric reflux given that it. Certainly after patient finished pizza. However given the patient does have a history of heart disease will plan on getting a second troponin. At this time I do not think the patient's pain represents pulmonary embolism. Chest x-ray was negative for any pneumonia or pneumothorax. Do not think esophageal rupture.  ____________________________________________   FINAL CLINICAL IMPRESSION(S) / ED DIAGNOSES  Final diagnoses:  Nonspecific chest pain     Note: This dictation was prepared with Dragon dictation. Any transcriptional errors that result from this process are unintentional     Nance Pear, MD 08/28/16 2238

## 2016-08-29 ENCOUNTER — Telehealth: Payer: Self-pay | Admitting: Cardiovascular Disease

## 2016-08-29 LAB — TROPONIN I

## 2016-08-29 NOTE — Telephone Encounter (Signed)
Spoke to patient for he needs to make an ED fu apportionment with Dr Rockey Situ He said he was not able to schedule this at the moment. He would have Tammy his daughter in law call us and schedule this.  Will await the call

## 2016-08-29 NOTE — Telephone Encounter (Signed)
Pt is coming on 09/05/16 to see Dr Rockey Situ Nothing further needed.

## 2016-08-29 NOTE — ED Notes (Signed)
Pt assisted to the bathroom.

## 2016-08-29 NOTE — ED Notes (Signed)
Ladona Mow called to see if the patient's daughter had made arrangements, cab stated yes and would be here in approx 20-30 min, pt aware

## 2016-08-29 NOTE — ED Provider Notes (Signed)
-----------------------------------------   12:28 AM on 08/29/2016 -----------------------------------------   Blood pressure (!) 146/83, pulse 75, temperature 98 F (36.7 C), temperature source Oral, resp. rate 16, SpO2 95 %.  Assuming care from Dr. Archie Balboa.  In short, Reginald [name] is a 81 y.o. male with a chief complaint of Chest Pain .  Refer to the original H&P for additional details.  The current plan of care is to follow up the results of the repeat troponin.     The patient's repeat troponin is negative. He'll be discharged home.   [name], MD 08/29/16 Shelah Lewandowsky

## 2016-08-30 DIAGNOSIS — L97811 Non-pressure chronic ulcer of other part of right lower leg limited to breakdown of skin: Secondary | ICD-10-CM | POA: Diagnosis not present

## 2016-08-30 DIAGNOSIS — I890 Lymphedema, not elsewhere classified: Secondary | ICD-10-CM | POA: Diagnosis not present

## 2016-08-30 DIAGNOSIS — E1122 Type 2 diabetes mellitus with diabetic chronic kidney disease: Secondary | ICD-10-CM | POA: Diagnosis not present

## 2016-08-30 DIAGNOSIS — L03116 Cellulitis of left lower limb: Secondary | ICD-10-CM | POA: Diagnosis not present

## 2016-08-30 DIAGNOSIS — L03115 Cellulitis of right lower limb: Secondary | ICD-10-CM | POA: Diagnosis not present

## 2016-08-30 DIAGNOSIS — L97822 Non-pressure chronic ulcer of other part of left lower leg with fat layer exposed: Secondary | ICD-10-CM | POA: Diagnosis not present

## 2016-09-01 DIAGNOSIS — I110 Hypertensive heart disease with heart failure: Secondary | ICD-10-CM | POA: Diagnosis not present

## 2016-09-01 DIAGNOSIS — Z6841 Body Mass Index (BMI) 40.0 and over, adult: Secondary | ICD-10-CM | POA: Diagnosis not present

## 2016-09-01 DIAGNOSIS — E11622 Type 2 diabetes mellitus with other skin ulcer: Secondary | ICD-10-CM | POA: Diagnosis not present

## 2016-09-01 DIAGNOSIS — E559 Vitamin D deficiency, unspecified: Secondary | ICD-10-CM | POA: Diagnosis not present

## 2016-09-01 DIAGNOSIS — F419 Anxiety disorder, unspecified: Secondary | ICD-10-CM | POA: Diagnosis not present

## 2016-09-01 DIAGNOSIS — E785 Hyperlipidemia, unspecified: Secondary | ICD-10-CM | POA: Diagnosis not present

## 2016-09-02 DIAGNOSIS — L97822 Non-pressure chronic ulcer of other part of left lower leg with fat layer exposed: Secondary | ICD-10-CM | POA: Diagnosis not present

## 2016-09-02 DIAGNOSIS — L97811 Non-pressure chronic ulcer of other part of right lower leg limited to breakdown of skin: Secondary | ICD-10-CM | POA: Diagnosis not present

## 2016-09-02 DIAGNOSIS — L03116 Cellulitis of left lower limb: Secondary | ICD-10-CM | POA: Diagnosis not present

## 2016-09-02 DIAGNOSIS — L03115 Cellulitis of right lower limb: Secondary | ICD-10-CM | POA: Diagnosis not present

## 2016-09-02 DIAGNOSIS — E1122 Type 2 diabetes mellitus with diabetic chronic kidney disease: Secondary | ICD-10-CM | POA: Diagnosis not present

## 2016-09-02 DIAGNOSIS — I890 Lymphedema, not elsewhere classified: Secondary | ICD-10-CM | POA: Diagnosis not present

## 2016-09-05 ENCOUNTER — Encounter: Payer: Self-pay | Admitting: Cardiovascular Disease

## 2016-09-05 ENCOUNTER — Ambulatory Visit (INDEPENDENT_AMBULATORY_CARE_PROVIDER_SITE_OTHER): Payer: Medicare Other | Admitting: Cardiovascular Disease

## 2016-09-05 VITALS — BP 140/84 | HR 78 | Ht 69.5 in | Wt 302.5 lb

## 2016-09-05 DIAGNOSIS — E782 Mixed hyperlipidemia: Secondary | ICD-10-CM

## 2016-09-05 DIAGNOSIS — I890 Lymphedema, not elsewhere classified: Secondary | ICD-10-CM | POA: Diagnosis not present

## 2016-09-05 DIAGNOSIS — I25118 Atherosclerotic heart disease of native coronary artery with other forms of angina pectoris: Secondary | ICD-10-CM

## 2016-09-05 DIAGNOSIS — N184 Chronic kidney disease, stage 4 (severe): Secondary | ICD-10-CM | POA: Diagnosis not present

## 2016-09-05 DIAGNOSIS — I5032 Chronic diastolic (congestive) heart failure: Secondary | ICD-10-CM

## 2016-09-05 DIAGNOSIS — I129 Hypertensive chronic kidney disease with stage 1 through stage 4 chronic kidney disease, or unspecified chronic kidney disease: Secondary | ICD-10-CM | POA: Diagnosis not present

## 2016-09-05 DIAGNOSIS — I209 Angina pectoris, unspecified: Secondary | ICD-10-CM

## 2016-09-05 DIAGNOSIS — E1322 Other specified diabetes mellitus with diabetic chronic kidney disease: Secondary | ICD-10-CM | POA: Diagnosis not present

## 2016-09-05 DIAGNOSIS — I110 Hypertensive heart disease with heart failure: Secondary | ICD-10-CM | POA: Diagnosis not present

## 2016-09-05 DIAGNOSIS — I482 Chronic atrial fibrillation, unspecified: Secondary | ICD-10-CM

## 2016-09-05 NOTE — Progress Notes (Signed)
Cardiology Office Note  Date:  09/05/2016   ID:  Reginald [name], DOB December 11, 1935, MRN [id number]  PCP:  Philmore Pali, NP   Chief Complaint  Patient presents with  . other    6 month follow up & [facility] f/u due to HTN. "doing well."     HPI:  Reginald [name] Is a 81 year old gentleman with long history of lymphedema,  lower extremity ulcers, diabetes, obesity, hypertension, coronary artery disease, atrial fibrillation (on anticoagulation), hyperlipidemia, COPD, CHF, PAD, Bilateral moderate popliteal stenosis (moderate to severe left popliteal artery stenosis), history of PCI to LAD over 10 years ago  Chronic renal insufficiency in the setting of outflow tract obstruction November 2016 creatinine up to 4 (now down to 1.37) Who presents  for follow up of his leg swelling/lymphedema, coronary disease  In follow-up today he presents with his daughter She has been helping to take care of him Reports he had unaboots wrapped/placed past few weeks He is using his Lymphedema compression pumps 4 days per week one hour at a time Has a person aid 3 days a week that helped some but the more  Daughter comes 2 days a week  Previous ulcerations have healed, blistering resolved Tried to follow leg elevation protocol  Continues to take Lasix 40 mg twice a day drinks lots of fluid especially in the evening, up 3 times a night to go urinate   Recent visit to the emergency room for chest pain Records reviewed, felt to be atypical in nature Lab work reviewed with him troponin negative, BMP stable He had eaten a whole pizza in 2 separate sittings then developed some chest tightness Suspect secondary to indigestion  Recent recommendation by outside physician to hold his metoprolol, start propranolol for tremor He has not started this yet, propranolol dose unclear, possibly extended release 120 mg daily  Continues to have gait instability, no recent falls, walks with a walker  EKG shows atrial fibrillation with  ventricular rate 78 bpm, unable to exclude old anteroseptal infarct  Other past medical history reviewed  Echocardiogram June 2011 showing normal LV function, right ventricular systolic pressure 40 ABIs done January 2017 0.9 bilaterally  Stress test 2/ 2012, mild fixed defect anterior wall, ejection fraction 58%  CT scan lower extremities March 2017 Bilateral outflow disease, moderate to severe stenosis distal left popliteal artery, Coronary calcifications noted Extensive subcutaneous edema  PMH:   has a past medical history of Anteroseptal myocardial infarction ([place]); Atrial fibrillation ([place]); CHF (congestive heart failure) ([employer]); Chronic kidney disease; COPD (chronic obstructive pulmonary disease) ([place]); Coronary atherosclerosis of unspecified type of vessel, native or graft; Diverticulosis; Glaucoma; Hypercholesteremia; Hypertensive renal disease; Hypokalemia; Obesity hypoventilation syndrome ([place]); Obesity, unspecified; OSA (obstructive sleep apnea); Rhabdomyolysis; Type II or unspecified type diabetes mellitus without mention of complication, not stated as uncontrolled; Unspecified essential hypertension; and Vestibulopathy.  PSH:    Past Surgical History:  Procedure Laterality Date  . APPENDECTOMY  1985  . CARDIAC CATHETERIZATION    . CHOLECYSTECTOMY  06/2000  . CORONARY ANGIOPLASTY  08/14/1995   stent placement to LAD   . INGUINAL HERNIA REPAIR Right 1985  . KNEE ARTHROSCOPY Left 1991  . LUMBAR FUSION    . NOSE SURGERY  1970s   Per Dr. Terrence Dupont [facility] in pt chart    Current Outpatient Prescriptions  Medication Sig Dispense Refill  . atorvastatin (LIPITOR) 20 MG tablet Take 20 mg by mouth daily at 6 PM.     . cholecalciferol (VITAMIN D) 1000 UNITS  tablet Take 1,000 Units by mouth daily.    . collagenase (SANTYL) ointment Apply topically daily. 15 g 0  . escitalopram (LEXAPRO) 5 MG tablet Take 5 mg by mouth daily.    . furosemide (LASIX) 40 MG tablet Take 40 mg by mouth 2  (two) times daily. 40 mg in the morning and 40 mg at 2 pm.    . Lactobacillus (ACIDOPHILUS PROBIOTIC) 10 MG TABS Take 10 mg by mouth 3 (three) times daily. (Patient taking differently: Take 10 mg by mouth at bedtime. ) 90 tablet 0  . latanoprost (XALATAN) 0.005 % ophthalmic solution Place 1 drop into both eyes at bedtime.    . Magnesium 400 MG TABS Take 400 mg by mouth daily.    . metoprolol tartrate (LOPRESSOR) 25 MG tablet Take 1 tablet (25 mg total) by mouth 2 (two) times daily. 60 tablet 3  . nitroGLYCERIN (NITROSTAT) 0.4 MG SL tablet Place 0.4 mg under the tongue every 5 (five) minutes as needed for chest pain.    . polyethylene glycol (MIRALAX / GLYCOLAX) packet Take 17 g by mouth daily.    . potassium chloride SA (K-DUR,KLOR-CON) 20 MEQ tablet Take 60 mEq by mouth daily.     . propranolol ER (INDERAL LA) 120 MG 24 hr capsule Take 120 mg by mouth daily.    . saxagliptin HCl (ONGLYZA) 5 MG TABS tablet Take 5 mg by mouth daily.    . vitamin C (ASCORBIC ACID) 500 MG tablet Take 1,000 mg by mouth daily.    Alveda Reasons 15 MG TABS tablet Take 15 mg by mouth daily with supper.      No current facility-administered medications for this visit.      Allergies:   Adhesive [tape]   Social History:  The patient  reports that he has never smoked. He has never used smokeless tobacco. He reports that he does not drink alcohol or use drugs.   Family History:   family history includes Breast cancer in his maternal aunt; COPD in his mother; Colon cancer in his maternal aunt; Diabetes in his father, paternal grandmother, and sister; Heart disease in his brother; Ovarian cancer in his daughter.    Review of Systems: Review of Systems  Constitutional: Negative.   Respiratory: Negative.   Cardiovascular: Positive for leg swelling.  Gastrointestinal: Negative.   Musculoskeletal: Negative.        Gait instability  Neurological: Negative.   Psychiatric/Behavioral: Negative.   All other systems reviewed  and are negative.    PHYSICAL EXAM: VS:  BP 140/84 (BP Location: Left Arm, Patient Position: Sitting, Cuff Size: Normal)   Pulse 78   Ht 5' 9.5" (1.765 m)   Wt (!) 302 lb 8 oz (137.2 kg)   BMI 44.03 kg/m  , BMI Body mass index is 44.03 kg/m. GEN: Well nourished, well developed, in no acute distress ,  obese  HEENT: normal  Neck: no JVD, carotid bruits, or masses Cardiac: Irregular rate and rhythm, ; no murmurs, rubs, or gallops, bilateral leg wraps in place , appears to have trace pitting edema lower extremities bilaterally mid shins down  Respiratory:  clear to auscultation bilaterally, normal work of breathing GI: soft, nontender, nondistended, + BS MS: no deformity or atrophy  Skin: warm and dry, no rash Neuro:  Strength and sensation are intact Psych: euthymic mood, full affect    Recent Labs: 08/11/2016: ALT 21 08/28/2016: BUN 23; Creatinine, Ser 1.33; Hemoglobin 15.3; Platelets 204; Potassium 3.6; Sodium 142  Lipid Panel Lab Results  Component Value Date   CHOL  08/12/2010    154        ATP III CLASSIFICATION:  <200     mg/dL   Desirable  200-239  mg/dL   Borderline High  >=240    mg/dL   High          HDL 53 08/12/2010   LDLCALC  08/12/2010    81        Total Cholesterol/HDL:CHD Risk Coronary Heart Disease Risk Table                     Men   Women  1/2 Average Risk   3.4   3.3  Average Risk       5.0   4.4  2 X Average Risk   9.6   7.1  3 X Average Risk  23.4   11.0        Use the calculated Patient Ratio above and the CHD Risk Table to determine the patient's CHD Risk.        ATP III CLASSIFICATION (LDL):  <100     mg/dL   Optimal  100-129  mg/dL   Near or Above                    Optimal  130-159  mg/dL   Borderline  160-189  mg/dL   High  >190     mg/dL   Very High   TRIG 101 08/12/2010      Wt Readings from Last 3 Encounters:  09/05/16 (!) 302 lb 8 oz (137.2 kg)  08/11/16 300 lb (136.1 kg)  05/02/16 298 lb 12 oz (135.5 kg)        ASSESSMENT AND PLAN:  Chronic atrial fibrillation (HCC) - Plan: EKG 12-Lead Rate well controlled, tolerating anticoagulation  Diastolic dysfunction with chronic heart failure (HCC) - Plan: EKG 12-Lead Tolerating Lasix 40 mg twice a day with stable weight, stable renal function Discussed moderating his fluid intake, monitoring weight, adjusting his diuretic regiment for weight gain  Hypertensive heart disease with CHF (congestive heart failure) ([place]) - Plan: EKG 12-Lead Blood pressure is well controlled on today's visit. No changes made to the medications.  Coronary artery disease involving native coronary artery of native heart with other form of angina pectoris ([place]) Currently with no symptoms of angina. No further workup at this time. Continue current medication regimen.  Mixed hyperlipidemia Cholesterol is at goal on the current lipid regimen. No changes to the medications were made.  Secondary DM with CKD stage 4 and hypertension (HCC) Hemoglobin A1c 6.7 Discussed his diet with him, recommended low bread, to avoid pizza  Lymphedema Long time spent today discussing his therapy Improved symptoms with leg wraps  also using his lymphedema compression pumps He will try to use his pumps as often as possible though he is unable to place these himself He is trying to buy Velcro compression hose  Morbid obesity ([place]) We have encouraged continued exercise, careful diet management in an effort to lose weight.  Long discussion with patient and patient's daughter, all questions answered concerning above  Total encounter time more than 45 minutes  Greater than 50% was spent in counseling and coordination of care with the patient   Disposition:   F/U  6 months   Orders Placed This Encounter  Procedures  . EKG 12-Lead     Signed, Esmond Plants, M.D., Ph.D. 09/05/2016  [place]  [place], [place]

## 2016-09-05 NOTE — Patient Instructions (Signed)

## 2016-09-06 DIAGNOSIS — I890 Lymphedema, not elsewhere classified: Secondary | ICD-10-CM | POA: Diagnosis not present

## 2016-09-06 DIAGNOSIS — E1122 Type 2 diabetes mellitus with diabetic chronic kidney disease: Secondary | ICD-10-CM | POA: Diagnosis not present

## 2016-09-06 DIAGNOSIS — L97811 Non-pressure chronic ulcer of other part of right lower leg limited to breakdown of skin: Secondary | ICD-10-CM | POA: Diagnosis not present

## 2016-09-06 DIAGNOSIS — L97822 Non-pressure chronic ulcer of other part of left lower leg with fat layer exposed: Secondary | ICD-10-CM | POA: Diagnosis not present

## 2016-09-06 DIAGNOSIS — L03115 Cellulitis of right lower limb: Secondary | ICD-10-CM | POA: Diagnosis not present

## 2016-09-06 DIAGNOSIS — L03116 Cellulitis of left lower limb: Secondary | ICD-10-CM | POA: Diagnosis not present

## 2016-09-13 DIAGNOSIS — L03115 Cellulitis of right lower limb: Secondary | ICD-10-CM | POA: Diagnosis not present

## 2016-09-13 DIAGNOSIS — I890 Lymphedema, not elsewhere classified: Secondary | ICD-10-CM | POA: Diagnosis not present

## 2016-09-13 DIAGNOSIS — L97822 Non-pressure chronic ulcer of other part of left lower leg with fat layer exposed: Secondary | ICD-10-CM | POA: Diagnosis not present

## 2016-09-13 DIAGNOSIS — E1122 Type 2 diabetes mellitus with diabetic chronic kidney disease: Secondary | ICD-10-CM | POA: Diagnosis not present

## 2016-09-13 DIAGNOSIS — L97811 Non-pressure chronic ulcer of other part of right lower leg limited to breakdown of skin: Secondary | ICD-10-CM | POA: Diagnosis not present

## 2016-09-13 DIAGNOSIS — L03116 Cellulitis of left lower limb: Secondary | ICD-10-CM | POA: Diagnosis not present

## 2016-09-20 DIAGNOSIS — L97811 Non-pressure chronic ulcer of other part of right lower leg limited to breakdown of skin: Secondary | ICD-10-CM | POA: Diagnosis not present

## 2016-09-20 DIAGNOSIS — L97822 Non-pressure chronic ulcer of other part of left lower leg with fat layer exposed: Secondary | ICD-10-CM | POA: Diagnosis not present

## 2016-09-20 DIAGNOSIS — L03116 Cellulitis of left lower limb: Secondary | ICD-10-CM | POA: Diagnosis not present

## 2016-09-20 DIAGNOSIS — I890 Lymphedema, not elsewhere classified: Secondary | ICD-10-CM | POA: Diagnosis not present

## 2016-09-20 DIAGNOSIS — E1122 Type 2 diabetes mellitus with diabetic chronic kidney disease: Secondary | ICD-10-CM | POA: Diagnosis not present

## 2016-09-20 DIAGNOSIS — L03115 Cellulitis of right lower limb: Secondary | ICD-10-CM | POA: Diagnosis not present

## 2016-09-28 DIAGNOSIS — L03115 Cellulitis of right lower limb: Secondary | ICD-10-CM | POA: Diagnosis not present

## 2016-09-28 DIAGNOSIS — E1122 Type 2 diabetes mellitus with diabetic chronic kidney disease: Secondary | ICD-10-CM | POA: Diagnosis not present

## 2016-09-28 DIAGNOSIS — I890 Lymphedema, not elsewhere classified: Secondary | ICD-10-CM | POA: Diagnosis not present

## 2016-09-28 DIAGNOSIS — L97822 Non-pressure chronic ulcer of other part of left lower leg with fat layer exposed: Secondary | ICD-10-CM | POA: Diagnosis not present

## 2016-09-28 DIAGNOSIS — L97811 Non-pressure chronic ulcer of other part of right lower leg limited to breakdown of skin: Secondary | ICD-10-CM | POA: Diagnosis not present

## 2016-09-28 DIAGNOSIS — L03116 Cellulitis of left lower limb: Secondary | ICD-10-CM | POA: Diagnosis not present

## 2016-10-04 DIAGNOSIS — E1122 Type 2 diabetes mellitus with diabetic chronic kidney disease: Secondary | ICD-10-CM | POA: Diagnosis not present

## 2016-10-04 DIAGNOSIS — L03115 Cellulitis of right lower limb: Secondary | ICD-10-CM | POA: Diagnosis not present

## 2016-10-04 DIAGNOSIS — L97811 Non-pressure chronic ulcer of other part of right lower leg limited to breakdown of skin: Secondary | ICD-10-CM | POA: Diagnosis not present

## 2016-10-04 DIAGNOSIS — L97822 Non-pressure chronic ulcer of other part of left lower leg with fat layer exposed: Secondary | ICD-10-CM | POA: Diagnosis not present

## 2016-10-04 DIAGNOSIS — L03116 Cellulitis of left lower limb: Secondary | ICD-10-CM | POA: Diagnosis not present

## 2016-10-04 DIAGNOSIS — I890 Lymphedema, not elsewhere classified: Secondary | ICD-10-CM | POA: Diagnosis not present

## 2016-10-10 DIAGNOSIS — L03116 Cellulitis of left lower limb: Secondary | ICD-10-CM | POA: Diagnosis not present

## 2016-10-10 DIAGNOSIS — I890 Lymphedema, not elsewhere classified: Secondary | ICD-10-CM | POA: Diagnosis not present

## 2016-10-10 DIAGNOSIS — L97822 Non-pressure chronic ulcer of other part of left lower leg with fat layer exposed: Secondary | ICD-10-CM | POA: Diagnosis not present

## 2016-10-10 DIAGNOSIS — L97811 Non-pressure chronic ulcer of other part of right lower leg limited to breakdown of skin: Secondary | ICD-10-CM | POA: Diagnosis not present

## 2016-10-10 DIAGNOSIS — L03115 Cellulitis of right lower limb: Secondary | ICD-10-CM | POA: Diagnosis not present

## 2016-10-10 DIAGNOSIS — E1122 Type 2 diabetes mellitus with diabetic chronic kidney disease: Secondary | ICD-10-CM | POA: Diagnosis not present

## 2016-10-11 DIAGNOSIS — I509 Heart failure, unspecified: Secondary | ICD-10-CM | POA: Diagnosis not present

## 2016-10-11 DIAGNOSIS — J449 Chronic obstructive pulmonary disease, unspecified: Secondary | ICD-10-CM | POA: Diagnosis not present

## 2016-10-11 DIAGNOSIS — Z7901 Long term (current) use of anticoagulants: Secondary | ICD-10-CM | POA: Diagnosis not present

## 2016-10-11 DIAGNOSIS — Z6841 Body Mass Index (BMI) 40.0 and over, adult: Secondary | ICD-10-CM | POA: Diagnosis not present

## 2016-10-11 DIAGNOSIS — G4733 Obstructive sleep apnea (adult) (pediatric): Secondary | ICD-10-CM | POA: Diagnosis not present

## 2016-10-11 DIAGNOSIS — L97822 Non-pressure chronic ulcer of other part of left lower leg with fat layer exposed: Secondary | ICD-10-CM | POA: Diagnosis not present

## 2016-10-11 DIAGNOSIS — Z7984 Long term (current) use of oral hypoglycemic drugs: Secondary | ICD-10-CM | POA: Diagnosis not present

## 2016-10-11 DIAGNOSIS — Z4800 Encounter for change or removal of nonsurgical wound dressing: Secondary | ICD-10-CM | POA: Diagnosis not present

## 2016-10-11 DIAGNOSIS — E662 Morbid (severe) obesity with alveolar hypoventilation: Secondary | ICD-10-CM | POA: Diagnosis not present

## 2016-10-11 DIAGNOSIS — I872 Venous insufficiency (chronic) (peripheral): Secondary | ICD-10-CM | POA: Diagnosis not present

## 2016-10-11 DIAGNOSIS — E1122 Type 2 diabetes mellitus with diabetic chronic kidney disease: Secondary | ICD-10-CM | POA: Diagnosis not present

## 2016-10-11 DIAGNOSIS — L97222 Non-pressure chronic ulcer of left calf with fat layer exposed: Secondary | ICD-10-CM | POA: Diagnosis not present

## 2016-10-11 DIAGNOSIS — I890 Lymphedema, not elsewhere classified: Secondary | ICD-10-CM | POA: Diagnosis not present

## 2016-10-11 DIAGNOSIS — I130 Hypertensive heart and chronic kidney disease with heart failure and stage 1 through stage 4 chronic kidney disease, or unspecified chronic kidney disease: Secondary | ICD-10-CM | POA: Diagnosis not present

## 2016-10-11 DIAGNOSIS — L97211 Non-pressure chronic ulcer of right calf limited to breakdown of skin: Secondary | ICD-10-CM | POA: Diagnosis not present

## 2016-10-11 DIAGNOSIS — I4891 Unspecified atrial fibrillation: Secondary | ICD-10-CM | POA: Diagnosis not present

## 2016-10-11 DIAGNOSIS — N189 Chronic kidney disease, unspecified: Secondary | ICD-10-CM | POA: Diagnosis not present

## 2016-10-12 DIAGNOSIS — L97822 Non-pressure chronic ulcer of other part of left lower leg with fat layer exposed: Secondary | ICD-10-CM | POA: Diagnosis not present

## 2016-10-12 DIAGNOSIS — I890 Lymphedema, not elsewhere classified: Secondary | ICD-10-CM | POA: Diagnosis not present

## 2016-10-12 DIAGNOSIS — L97211 Non-pressure chronic ulcer of right calf limited to breakdown of skin: Secondary | ICD-10-CM | POA: Diagnosis not present

## 2016-10-12 DIAGNOSIS — I872 Venous insufficiency (chronic) (peripheral): Secondary | ICD-10-CM | POA: Diagnosis not present

## 2016-10-12 DIAGNOSIS — E1122 Type 2 diabetes mellitus with diabetic chronic kidney disease: Secondary | ICD-10-CM | POA: Diagnosis not present

## 2016-10-12 DIAGNOSIS — L97222 Non-pressure chronic ulcer of left calf with fat layer exposed: Secondary | ICD-10-CM | POA: Diagnosis not present

## 2016-10-13 DIAGNOSIS — L97822 Non-pressure chronic ulcer of other part of left lower leg with fat layer exposed: Secondary | ICD-10-CM | POA: Diagnosis not present

## 2016-10-13 DIAGNOSIS — L97211 Non-pressure chronic ulcer of right calf limited to breakdown of skin: Secondary | ICD-10-CM | POA: Diagnosis not present

## 2016-10-13 DIAGNOSIS — I890 Lymphedema, not elsewhere classified: Secondary | ICD-10-CM | POA: Diagnosis not present

## 2016-10-13 DIAGNOSIS — E1122 Type 2 diabetes mellitus with diabetic chronic kidney disease: Secondary | ICD-10-CM | POA: Diagnosis not present

## 2016-10-13 DIAGNOSIS — L97222 Non-pressure chronic ulcer of left calf with fat layer exposed: Secondary | ICD-10-CM | POA: Diagnosis not present

## 2016-10-13 DIAGNOSIS — I872 Venous insufficiency (chronic) (peripheral): Secondary | ICD-10-CM | POA: Diagnosis not present

## 2016-10-18 DIAGNOSIS — L97822 Non-pressure chronic ulcer of other part of left lower leg with fat layer exposed: Secondary | ICD-10-CM | POA: Diagnosis not present

## 2016-10-18 DIAGNOSIS — E1122 Type 2 diabetes mellitus with diabetic chronic kidney disease: Secondary | ICD-10-CM | POA: Diagnosis not present

## 2016-10-18 DIAGNOSIS — I890 Lymphedema, not elsewhere classified: Secondary | ICD-10-CM | POA: Diagnosis not present

## 2016-10-18 DIAGNOSIS — L97211 Non-pressure chronic ulcer of right calf limited to breakdown of skin: Secondary | ICD-10-CM | POA: Diagnosis not present

## 2016-10-18 DIAGNOSIS — L97222 Non-pressure chronic ulcer of left calf with fat layer exposed: Secondary | ICD-10-CM | POA: Diagnosis not present

## 2016-10-18 DIAGNOSIS — I872 Venous insufficiency (chronic) (peripheral): Secondary | ICD-10-CM | POA: Diagnosis not present

## 2016-10-25 DIAGNOSIS — I872 Venous insufficiency (chronic) (peripheral): Secondary | ICD-10-CM | POA: Diagnosis not present

## 2016-10-25 DIAGNOSIS — E1122 Type 2 diabetes mellitus with diabetic chronic kidney disease: Secondary | ICD-10-CM | POA: Diagnosis not present

## 2016-10-25 DIAGNOSIS — L97211 Non-pressure chronic ulcer of right calf limited to breakdown of skin: Secondary | ICD-10-CM | POA: Diagnosis not present

## 2016-10-25 DIAGNOSIS — I890 Lymphedema, not elsewhere classified: Secondary | ICD-10-CM | POA: Diagnosis not present

## 2016-10-25 DIAGNOSIS — L97822 Non-pressure chronic ulcer of other part of left lower leg with fat layer exposed: Secondary | ICD-10-CM | POA: Diagnosis not present

## 2016-10-25 DIAGNOSIS — L97222 Non-pressure chronic ulcer of left calf with fat layer exposed: Secondary | ICD-10-CM | POA: Diagnosis not present

## 2016-10-27 ENCOUNTER — Encounter: Payer: Self-pay | Admitting: Emergency Medicine

## 2016-10-27 ENCOUNTER — Observation Stay: Payer: Medicare Other

## 2016-10-27 ENCOUNTER — Emergency Department: Payer: Medicare Other

## 2016-10-27 ENCOUNTER — Telehealth: Payer: Self-pay | Admitting: Cardiovascular Disease

## 2016-10-27 ENCOUNTER — Observation Stay
Admission: EM | Admit: 2016-10-27 | Discharge: 2016-10-28 | Disposition: A | Discharge status: Home / Self Care | Payer: Medicare Other | Attending: Specialist | Admitting: Specialist

## 2016-10-27 DIAGNOSIS — E119 Type 2 diabetes mellitus without complications: Secondary | ICD-10-CM | POA: Diagnosis not present

## 2016-10-27 DIAGNOSIS — I482 Chronic atrial fibrillation: Secondary | ICD-10-CM | POA: Diagnosis not present

## 2016-10-27 DIAGNOSIS — I130 Hypertensive heart and chronic kidney disease with heart failure and stage 1 through stage 4 chronic kidney disease, or unspecified chronic kidney disease: Secondary | ICD-10-CM | POA: Diagnosis not present

## 2016-10-27 DIAGNOSIS — I509 Heart failure, unspecified: Secondary | ICD-10-CM | POA: Insufficient documentation

## 2016-10-27 DIAGNOSIS — J449 Chronic obstructive pulmonary disease, unspecified: Secondary | ICD-10-CM | POA: Diagnosis not present

## 2016-10-27 DIAGNOSIS — R0789 Other chest pain: Principal | ICD-10-CM | POA: Insufficient documentation

## 2016-10-27 DIAGNOSIS — I481 Persistent atrial fibrillation: Secondary | ICD-10-CM | POA: Diagnosis not present

## 2016-10-27 DIAGNOSIS — Z9861 Coronary angioplasty status: Secondary | ICD-10-CM | POA: Diagnosis not present

## 2016-10-27 DIAGNOSIS — I10 Essential (primary) hypertension: Secondary | ICD-10-CM

## 2016-10-27 DIAGNOSIS — R079 Chest pain, unspecified: Secondary | ICD-10-CM | POA: Diagnosis not present

## 2016-10-27 DIAGNOSIS — N183 Chronic kidney disease, stage 3 (moderate): Secondary | ICD-10-CM | POA: Diagnosis not present

## 2016-10-27 DIAGNOSIS — I890 Lymphedema, not elsewhere classified: Secondary | ICD-10-CM | POA: Insufficient documentation

## 2016-10-27 DIAGNOSIS — Z7901 Long term (current) use of anticoagulants: Secondary | ICD-10-CM | POA: Insufficient documentation

## 2016-10-27 DIAGNOSIS — R0781 Pleurodynia: Secondary | ICD-10-CM

## 2016-10-27 DIAGNOSIS — I252 Old myocardial infarction: Secondary | ICD-10-CM | POA: Insufficient documentation

## 2016-10-27 DIAGNOSIS — E662 Morbid (severe) obesity with alveolar hypoventilation: Secondary | ICD-10-CM | POA: Insufficient documentation

## 2016-10-27 DIAGNOSIS — H409 Unspecified glaucoma: Secondary | ICD-10-CM | POA: Diagnosis not present

## 2016-10-27 DIAGNOSIS — I2510 Atherosclerotic heart disease of native coronary artery without angina pectoris: Secondary | ICD-10-CM | POA: Diagnosis not present

## 2016-10-27 DIAGNOSIS — E785 Hyperlipidemia, unspecified: Secondary | ICD-10-CM | POA: Diagnosis not present

## 2016-10-27 DIAGNOSIS — E1122 Type 2 diabetes mellitus with diabetic chronic kidney disease: Secondary | ICD-10-CM | POA: Insufficient documentation

## 2016-10-27 DIAGNOSIS — G4733 Obstructive sleep apnea (adult) (pediatric): Secondary | ICD-10-CM | POA: Diagnosis not present

## 2016-10-27 DIAGNOSIS — Z794 Long term (current) use of insulin: Secondary | ICD-10-CM | POA: Insufficient documentation

## 2016-10-27 DIAGNOSIS — Z6841 Body Mass Index (BMI) 40.0 and over, adult: Secondary | ICD-10-CM | POA: Diagnosis not present

## 2016-10-27 DIAGNOSIS — R0602 Shortness of breath: Secondary | ICD-10-CM | POA: Diagnosis not present

## 2016-10-27 DIAGNOSIS — E782 Mixed hyperlipidemia: Secondary | ICD-10-CM | POA: Diagnosis not present

## 2016-10-27 DIAGNOSIS — I25118 Atherosclerotic heart disease of native coronary artery with other forms of angina pectoris: Secondary | ICD-10-CM | POA: Diagnosis not present

## 2016-10-27 DIAGNOSIS — E7800 Pure hypercholesterolemia, unspecified: Secondary | ICD-10-CM | POA: Diagnosis not present

## 2016-10-27 DIAGNOSIS — F329 Major depressive disorder, single episode, unspecified: Secondary | ICD-10-CM | POA: Insufficient documentation

## 2016-10-27 LAB — CBC
HEMATOCRIT: 47 % (ref 40.0–52.0)
HEMOGLOBIN: 15.5 g/dL (ref 13.0–18.0)
MCH: 27.7 pg (ref 26.0–34.0)
MCHC: 32.9 g/dL (ref 32.0–36.0)
MCV: 84.2 fL (ref 80.0–100.0)
Platelets: 166 10*3/uL (ref 150–440)
RBC: 5.58 MIL/uL (ref 4.40–5.90)
RDW: 17.6 % — AB (ref 11.5–14.5)
WBC: 8.9 10*3/uL (ref 3.8–10.6)

## 2016-10-27 LAB — GLUCOSE, CAPILLARY
GLUCOSE-CAPILLARY: 113 mg/dL — AB (ref 65–99)
Glucose-Capillary: 132 mg/dL — ABNORMAL HIGH (ref 65–99)

## 2016-10-27 LAB — COMPREHENSIVE METABOLIC PANEL
ALBUMIN: 3.5 g/dL (ref 3.5–5.0)
ALK PHOS: 68 U/L (ref 38–126)
ALT: 19 U/L (ref 17–63)
AST: 28 U/L (ref 15–41)
Anion gap: 6 (ref 5–15)
BUN: 21 mg/dL — AB (ref 6–20)
CO2: 27 mmol/L (ref 22–32)
Calcium: 9 mg/dL (ref 8.9–10.3)
Chloride: 106 mmol/L (ref 101–111)
Creatinine, Ser: 1.28 mg/dL — ABNORMAL HIGH (ref 0.61–1.24)
GFR calc Af Amer: >60 mL/min (ref >60)
GFR calc non Af Amer: 52 mL/min — ABNORMAL LOW (ref >60)
GLUCOSE: 140 mg/dL — AB (ref 65–99)
POTASSIUM: 4.2 mmol/L (ref 3.5–5.1)
Sodium: 139 mmol/L (ref 135–145)
Total Bilirubin: 1.7 mg/dL — ABNORMAL HIGH (ref 0.3–1.2)
Total Protein: 6.3 g/dL — ABNORMAL LOW (ref 6.5–8.1)

## 2016-10-27 LAB — PROTIME-INR
INR: 1.38
Prothrombin Time: 17.1 seconds — ABNORMAL HIGH (ref 11.4–15.2)

## 2016-10-27 LAB — TROPONIN I: Troponin I: <0.03 ng/mL (ref <0.03)

## 2016-10-27 LAB — BRAIN NATRIURETIC PEPTIDE: B Natriuretic Peptide: 140 pg/mL — ABNORMAL HIGH (ref 0.0–100.0)

## 2016-10-27 MED ORDER — SODIUM CHLORIDE 0.9% FLUSH
3.0000 mL | Freq: Two times a day (BID) | INTRAVENOUS | Status: DC
  Administered 2016-10-27 – 2016-10-28 (×2): 3 mL via INTRAVENOUS

## 2016-10-27 MED ORDER — INSULIN ASPART 100 UNIT/ML ~~LOC~~ SOLN
0.0000 [IU] | Freq: Three times a day (TID) | SUBCUTANEOUS | Status: DC
  Filled 2016-10-27: qty 3

## 2016-10-27 MED ORDER — POLYETHYLENE GLYCOL 3350 17 G PO PACK
17.0000 g | PACK | Freq: Every day | ORAL | Status: DC
  Administered 2016-10-27 – 2016-10-28 (×2): 17 g via ORAL
  Filled 2016-10-27 (×2): qty 1

## 2016-10-27 MED ORDER — TECHNETIUM TC 99M DIETHYLENETRIAME-PENTAACETIC ACID
31.4960 | Freq: Once | INTRAVENOUS | Status: AC | PRN
  Administered 2016-10-27: 31.496 via RESPIRATORY_TRACT

## 2016-10-27 MED ORDER — POTASSIUM CHLORIDE CRYS ER 20 MEQ PO TBCR
60.0000 meq | EXTENDED_RELEASE_TABLET | Freq: Every day | ORAL | Status: DC
  Administered 2016-10-27 – 2016-10-28 (×2): 60 meq via ORAL
  Filled 2016-10-27 (×2): qty 3

## 2016-10-27 MED ORDER — ATORVASTATIN CALCIUM 20 MG PO TABS
20.0000 mg | ORAL_TABLET | Freq: Every day | ORAL | Status: DC
  Administered 2016-10-27: 20 mg via ORAL
  Filled 2016-10-27: qty 1

## 2016-10-27 MED ORDER — INSULIN ASPART 100 UNIT/ML ~~LOC~~ SOLN
6.0000 [IU] | Freq: Three times a day (TID) | SUBCUTANEOUS | Status: DC
  Filled 2016-10-27: qty 6

## 2016-10-27 MED ORDER — ASPIRIN EC 81 MG PO TBEC
81.0000 mg | DELAYED_RELEASE_TABLET | Freq: Every day | ORAL | Status: DC
  Administered 2016-10-28: 81 mg via ORAL
  Filled 2016-10-27: qty 1

## 2016-10-27 MED ORDER — RIVAROXABAN 15 MG PO TABS
15.0000 mg | ORAL_TABLET | Freq: Every day | ORAL | Status: DC
  Administered 2016-10-27 – 2016-10-28 (×2): 15 mg via ORAL
  Filled 2016-10-27 (×2): qty 1

## 2016-10-27 MED ORDER — SODIUM CHLORIDE 0.9 % IV SOLN: 250.0000 mL | INTRAVENOUS | Status: DC | PRN

## 2016-10-27 MED ORDER — FUROSEMIDE 40 MG PO TABS
40.0000 mg | ORAL_TABLET | Freq: Two times a day (BID) | ORAL | Status: DC
  Administered 2016-10-27 – 2016-10-28 (×2): 40 mg via ORAL
  Filled 2016-10-27 (×2): qty 1

## 2016-10-27 MED ORDER — LOSARTAN POTASSIUM 50 MG PO TABS
100.0000 mg | ORAL_TABLET | Freq: Every day | ORAL | Status: DC
  Administered 2016-10-27 – 2016-10-28 (×2): 100 mg via ORAL
  Filled 2016-10-27 (×2): qty 2

## 2016-10-27 MED ORDER — INSULIN ASPART 100 UNIT/ML ~~LOC~~ SOLN: 0.0000 [IU] | Freq: Every day | SUBCUTANEOUS | Status: DC

## 2016-10-27 MED ORDER — SODIUM CHLORIDE 0.9% FLUSH
3.0000 mL | Freq: Two times a day (BID) | INTRAVENOUS | Status: DC
  Administered 2016-10-27 – 2016-10-28 (×3): 3 mL via INTRAVENOUS

## 2016-10-27 MED ORDER — IPRATROPIUM-ALBUTEROL 0.5-2.5 (3) MG/3ML IN SOLN
3.0000 mL | RESPIRATORY_TRACT | Status: DC
Start: 2016-10-27 — End: 2016-10-28
  Administered 2016-10-27 – 2016-10-28 (×6): 3 mL via RESPIRATORY_TRACT
  Filled 2016-10-27 (×5): qty 3

## 2016-10-27 MED ORDER — ACETAMINOPHEN 650 MG RE SUPP: 650.0000 mg | Freq: Four times a day (QID) | RECTAL | Status: DC | PRN

## 2016-10-27 MED ORDER — ESCITALOPRAM OXALATE 10 MG PO TABS
5.0000 mg | ORAL_TABLET | Freq: Every day | ORAL | Status: DC
Start: 2016-10-27 — End: 2016-10-28
  Administered 2016-10-27 – 2016-10-28 (×2): 5 mg via ORAL
  Filled 2016-10-27: qty 2
  Filled 2016-10-27: qty 1

## 2016-10-27 MED ORDER — DOCUSATE SODIUM 100 MG PO CAPS
100.0000 mg | ORAL_CAPSULE | Freq: Two times a day (BID) | ORAL | Status: DC
  Administered 2016-10-27 – 2016-10-28 (×2): 100 mg via ORAL
  Filled 2016-10-27 (×3): qty 1

## 2016-10-27 MED ORDER — ACETAMINOPHEN 325 MG PO TABS: 650.0000 mg | ORAL_TABLET | Freq: Four times a day (QID) | ORAL | Status: DC | PRN

## 2016-10-27 MED ORDER — ONDANSETRON HCL 4 MG PO TABS: 4.0000 mg | ORAL_TABLET | Freq: Four times a day (QID) | ORAL | Status: DC | PRN

## 2016-10-27 MED ORDER — ONDANSETRON HCL 4 MG/2ML IJ SOLN: 4.0000 mg | Freq: Four times a day (QID) | INTRAMUSCULAR | Status: DC | PRN

## 2016-10-27 MED ORDER — NITROGLYCERIN 0.4 MG SL SUBL: 0.4000 mg | SUBLINGUAL_TABLET | SUBLINGUAL | Status: DC | PRN

## 2016-10-27 MED ORDER — NITROGLYCERIN 2 % TD OINT
1.0000 [in_us] | TOPICAL_OINTMENT | Freq: Once | TRANSDERMAL | Status: AC
  Administered 2016-10-27: 1 [in_us] via TOPICAL
  Filled 2016-10-27: qty 1

## 2016-10-27 MED ORDER — METOPROLOL TARTRATE 25 MG PO TABS
25.0000 mg | ORAL_TABLET | Freq: Two times a day (BID) | ORAL | Status: DC
Start: 2016-10-27 — End: 2016-10-28
  Administered 2016-10-27 – 2016-10-28 (×3): 25 mg via ORAL
  Filled 2016-10-27 (×3): qty 1

## 2016-10-27 MED ORDER — SODIUM CHLORIDE 0.9% FLUSH: 3.0000 mL | INTRAVENOUS | Status: DC | PRN

## 2016-10-27 MED ORDER — TECHNETIUM TO 99M ALBUMIN AGGREGATED
4.0580 | Freq: Once | INTRAVENOUS | Status: AC | PRN
  Administered 2016-10-27: 4.058 via INTRAVENOUS

## 2016-10-27 MED ORDER — LINAGLIPTIN 5 MG PO TABS
5.0000 mg | ORAL_TABLET | Freq: Every day | ORAL | Status: DC
  Administered 2016-10-27 – 2016-10-28 (×2): 5 mg via ORAL
  Filled 2016-10-27 (×2): qty 1

## 2016-10-27 NOTE — ED Provider Notes (Signed)
[facility] Emergency Department Provider Note       Time seen: ----------------------------------------- 9:33 AM on 10/27/2016 -----------------------------------------     I have reviewed the triage vital signs and the nursing notes.   HISTORY   Chief Complaint No chief complaint on file.    HPI Reginald [name] is a 81 y.o. male who presents to the ED for chest pain that began at some point in the night last night. Patient reports having dull left-sided chest pain that was associated with difficulty breathing. He also notes a 6 pound weight gain recently. He denies fevers, chills, sweats, nausea or other complaints. He received aspirin and nitroglycerin in route which alleviated his symptoms began a mild headache. Patient denies chest pain like this in the past.   Past Medical History:  Diagnosis Date  . Anteroseptal myocardial infarction ([place])   . Atrial fibrillation ([place])   . CHF (congestive heart failure) ([place])   . Chronic kidney disease   . COPD (chronic obstructive pulmonary disease) ([place])   . Coronary atherosclerosis of unspecified type of vessel, native or graft   . Diverticulosis   . Glaucoma   . Hypercholesteremia   . Hypertensive renal disease   . Hypokalemia   . Obesity hypoventilation syndrome ([place])   . Obesity, unspecified   . OSA (obstructive sleep apnea)   . Rhabdomyolysis   . Type II or unspecified type diabetes mellitus without mention of complication, not stated as uncontrolled   . Unspecified essential hypertension   . Vestibulopathy     Patient Active Problem List   Diagnosis Date Noted  . Morbid obesity ([place]) 05/02/2016  . Lymphedema 05/02/2016  . Abnormal laboratory test result 10/15/2015  . Cough 10/08/2015  . Hematuria 09/21/2015  . Hypokalemia 08/13/2015  . HTN (hypertension) 08/13/2015  . Multiple open wounds of lower leg 07/22/2015  . CKD (chronic kidney disease) stage 4, GFR 15-29 ml/min (HCC) 07/22/2015   . Cellulitis of leg, right 06/28/2015  . ARF (acute renal failure) ([place]) 06/28/2015  . Hypertensive heart disease with CHF (congestive heart failure) ([place]) 05/19/2015  . CAD (coronary artery disease), native coronary artery 05/19/2015  . Hyperlipidemia 05/19/2015  . Vitamin D deficiency 05/19/2015  . Pressure ulcer 05/16/2015  . Diastolic dysfunction with chronic heart failure ([place]) 05/08/2015  . Blisters of multiple sites 05/08/2015  . Type 2 diabetes mellitus ([place])   . Obesity, unspecified   . OSA (obstructive sleep apnea)   . Atrial fibrillation ([place])   . Secondary DM with CKD stage 4 and hypertension ([place])   . Glaucoma   . Cellulitis 05/07/2015    Past Surgical History:  Procedure Laterality Date  . APPENDECTOMY  1985  . CARDIAC CATHETERIZATION    . CHOLECYSTECTOMY  06/2000  . CORONARY ANGIOPLASTY  08/14/1995   stent placement to LAD   . INGUINAL HERNIA REPAIR Right 1985  . KNEE ARTHROSCOPY Left 1991  . LUMBAR FUSION    . NOSE SURGERY  1970s   Per Dr. Terrence Dupont [facility] in pt chart    Allergies Adhesive [tape]  Social History Social History  Substance Use Topics  . Smoking status: Never Smoker  . Smokeless tobacco: Never Used  . Alcohol use No    Review of Systems Constitutional: Negative for fever. Cardiovascular: Positive for chest pain Respiratory: Positive shortness of breath Gastrointestinal: Negative for abdominal pain, vomiting and diarrhea. Genitourinary: Negative for dysuria. Musculoskeletal: Negative for back pain. Positive for edema Skin: Negative for rash. Neurological: Negative for  headaches, focal weakness or numbness.  10-point ROS otherwise negative.  ____________________________________________   PHYSICAL EXAM:  VITAL SIGNS: ED Triage Vitals  Enc Vitals Group     BP      Pulse      Resp      Temp      Temp src      SpO2      Weight      Height      Head Circumference      Peak Flow      Pain Score      Pain Loc      Pain Edu?       Excl. in [place]?     Constitutional: Alert and oriented. Obese, no distress Eyes: Conjunctivae are normal. PERRL. Normal extraocular movements. ENT   Head: Normocephalic and atraumatic.   Nose: No congestion/rhinnorhea.   Mouth/Throat: Mucous membranes are moist.   Neck: No stridor. Cardiovascular: Irregularly irregular rhythm. No murmurs, rubs, or gallops. Respiratory: Normal respiratory effort without tachypnea nor retractions. Breath sounds are clear and equal bilaterally. No wheezes/rales/rhonchi. Gastrointestinal: Soft and nontender. Normal bowel sounds Musculoskeletal: Nontender with normal range of motion in extremities. No lower extremity tenderness nor edema. Neurologic:  Normal speech and language. No gross focal neurologic deficits are appreciated.  Skin:  Skin is warm, dry and intact. No rash noted. Psychiatric: Mood and affect are normal. Speech and behavior are normal.  ____________________________________________  EKG: Interpreted by me. Atrial fibrillation with a rate of 72 bpm, normal QRS size, normal QT, septal infarct age indeterminate.  ____________________________________________  ED COURSE:  Pertinent labs & imaging results that were available during my care of the patient were reviewed by me and considered in my medical decision making (see chart for details). Patient presents for chest pain and shortness of breath, we will assess with labs and imaging as indicated.   Procedures ____________________________________________   LABS (pertinent positives/negatives)  Labs Reviewed  CBC - Abnormal; Notable for the following:       Result Value   RDW 17.6 (*)    All other components within normal limits  COMPREHENSIVE METABOLIC PANEL - Abnormal; Notable for the following:    Glucose, Bld 140 (*)    BUN 21 (*)    Creatinine, Ser 1.28 (*)    Total Protein 6.3 (*)    Total Bilirubin 1.7 (*)    GFR calc non Af Amer 52 (*)    All other components  within normal limits  PROTIME-INR - Abnormal; Notable for the following:    Prothrombin Time 17.1 (*)    All other components within normal limits  BRAIN NATRIURETIC PEPTIDE - Abnormal; Notable for the following:    B Natriuretic Peptide 140.0 (*)    All other components within normal limits  TROPONIN I  TROPONIN I    RADIOLOGY Images were viewed by me  Chest x-ray IMPRESSION: Cardiomegaly and pulmonary vascular congestion. No acute disease. ____________________________________________  FINAL ASSESSMENT AND PLAN  Chest pain  Plan: Patient's labs and imaging were dictated above. Patient had presented for chest pain and is high risk for ACS. Currently he is chest pain-free with negative lab work and so we will consider hospital admission with cardiac workup and cardiology consultation.   Earleen Newport, MD   Note: This note was generated in part or whole with voice recognition software. Voice recognition is usually quite accurate but there are transcription errors that can and very often do occur. I apologize for  any typographical errors that were not detected and corrected.     Earleen Newport, MD 10/27/16 1044

## 2016-10-27 NOTE — Consult Note (Signed)
Cardiology Consult    Patient ID: RADAMES MEJORADO MRN: [id number], DOB/AGE: Apr 20, 1936   Admit date: 10/27/2016 Date of Consult: 10/27/2016  Primary Physician: Philmore Pali, NP Reason for Consult: Chest Pain Primary Cardiologist: Dr. Rockey Situ Requesting Provider: Dr. Ether Griffins   History of Present Illness    Reginald [name] is a 81 y.o. male with past medical history of CAD (s/p prior PCI to LAD in 1997), PAD, chronic atrial fibrillation (on Xarelto), lymphedema, Type 2 DM, HLD, COPD and Stage 3 CKD who is being seen today for the evaluation of chest pain at the request of Dr. Ether Griffins.   He was recently seen in the office by Dr. Rockey Situ in 08/2016 and reported using his lymphedema compression pumps four times per week. Reported good compliance with Lasix 40mg  BID and weight was stable at 302 lbs during his visit.   In talking with the patient today, he reports developing pleuritic pain along his left pectoral regional last night while sitting in his chair. Denies any associated dyspnea, nausea, vomiting, diaphoresis, or radiating pain. Says it hurt to take a deep breath. No association with exertion.  Home Health had been out to assess him earlier in the day for his lymphedema and had a difficult time obtaining a BP reading per his report. With this information and his new symptoms, he proceeded to the ED this morning for evaluation.    He was given SL NTG en route to [facility] but reports this only made his pain worse. His pain has since subsided since arrival to the floor.   Initial labs show WBC of 8.9, Hgb 15.5, platelets 166. Na+ 139, K+ 4.2, creatinine 1.28 (close to baseline). BNP 140. Initial and repeat troponin have been negative. CXR showing cardiomegaly and pulmonary vascular congestion with no acute disease. EKG shows atrial fibrillation, HR 72, with no acute ST or T-wave changes.   Past Medical History   Past Medical History:  Diagnosis Date  . Anteroseptal myocardial infarction  ([place])   . Atrial fibrillation (HCC)    a. chronic, on Xarelto  . CHF (congestive heart failure) ([place])   . Chronic kidney disease   . COPD (chronic obstructive pulmonary disease) ([place])   . Coronary atherosclerosis of unspecified type of vessel, native or graft    a. s/p prior PCI to LAD in 1997  . Diverticulosis   . Glaucoma   . Hypercholesteremia   . Hypertensive renal disease   . Hypokalemia   . Obesity hypoventilation syndrome ([place])   . Obesity, unspecified   . OSA (obstructive sleep apnea)   . Rhabdomyolysis   . Type II or unspecified type diabetes mellitus without mention of complication, not stated as uncontrolled   . Unspecified essential hypertension   . Vestibulopathy     Past Surgical History:  Procedure Laterality Date  . APPENDECTOMY  1985  . CARDIAC CATHETERIZATION    . CHOLECYSTECTOMY  06/2000  . CORONARY ANGIOPLASTY  08/14/1995   stent placement to LAD   . INGUINAL HERNIA REPAIR Right 1985  . KNEE ARTHROSCOPY Left 1991  . LUMBAR FUSION    . NOSE SURGERY  1970s   Per Dr. Terrence Dupont [facility] in pt chart     Allergies  Allergies  Allergen Reactions  . Adhesive [Tape] Rash    Inpatient Medications    . aspirin EC  81 mg Oral Daily  . atorvastatin  20 mg Oral q1800  . docusate sodium  100 mg Oral BID  .  escitalopram  5 mg Oral Daily  . furosemide  40 mg Oral BID  . insulin aspart  0-20 Units Subcutaneous TID WC  . insulin aspart  0-5 Units Subcutaneous QHS  . insulin aspart  6 Units Subcutaneous TID WC  . ipratropium-albuterol  3 mL Nebulization Q4H  . linagliptin  5 mg Oral Daily  . losartan  100 mg Oral Daily  . metoprolol tartrate  25 mg Oral BID  . polyethylene glycol  17 g Oral Daily  . potassium chloride SA  60 mEq Oral Daily  . Rivaroxaban  15 mg Oral Daily  . sodium chloride flush  3 mL Intravenous Q12H  . sodium chloride flush  3 mL Intravenous Q12H    Family History    Family History  Problem Relation Age of Onset  . COPD Mother   .  Diabetes Father   . Diabetes Sister   . Heart disease Brother   . Breast cancer Maternal Aunt   . Diabetes Paternal Grandmother   . Colon cancer Maternal Aunt   . Ovarian cancer Daughter     Social History    Social History   Social History  . Marital status: Single    Spouse name: N/A  . Number of children: 5  . Years of education: N/A   Occupational History  . retired    Social History Main Topics  . Smoking status: Never Smoker  . Smokeless tobacco: Never Used  . Alcohol use No  . Drug use: No  . Sexual activity: No   Other Topics Concern  . Not on file   Social History Narrative  . No narrative on file     Review of Systems    General:  No chills, fever, night sweats or weight changes.  Cardiovascular:  No dyspnea on exertion, orthopnea, palpitations, paroxysmal nocturnal dyspnea. Positive for chest pain and lower extremity edema.  Dermatological: No rash, lesions/masses Respiratory: No cough, dyspnea Urologic: No hematuria, dysuria Abdominal:   No nausea, vomiting, diarrhea, bright red blood per rectum, melena, or hematemesis Neurologic:  No visual changes, wkns, changes in mental status. All other systems reviewed and are otherwise negative except as noted above.  Physical Exam    Blood pressure (!) 155/64, pulse 72, temperature 98.3 F (36.8 C), resp. rate 18, height 5\' 9"  (1.753 m), weight (!) 306 lb (138.8 kg), SpO2 96 %.  General: Pleasant, obese Caucasian male appearing in NAD Psych: Normal affect. Neuro: Alert and oriented X 3. Moves all extremities spontaneously. HEENT: Normal  Neck: Supple without bruits. JVD difficult to assess secondary to body habitus. Lungs:  Resp regular and unlabored, CTA without wheezing or rales. Heart: Irregularly irregular, no s3, s4, or murmurs. Abdomen: Soft, non-tender, non-distended, BS + x 4.  Extremities: No clubbing or cyanosis. Lower extremities wrapped. DP/PT/Radials 2+ and equal bilaterally.  Labs      Troponin (Point of Care Test) No results for input(s): TROPIPOC in the last 72 hours.  Recent Labs  10/27/16 0935 10/27/16 1423  TROPONINI <0.03 <0.03   Lab Results  Component Value Date   WBC 8.9 10/27/2016   HGB 15.5 10/27/2016   HCT 47.0 10/27/2016   MCV 84.2 10/27/2016   PLT 166 10/27/2016     Recent Labs Lab 10/27/16 0935  NA 139  K 4.2  CL 106  CO2 27  BUN 21*  CREATININE 1.28*  CALCIUM 9.0  PROT 6.3*  BILITOT 1.7*  ALKPHOS 68  ALT 19  AST 28  GLUCOSE 140*   Lab Results  Component Value Date   CHOL  08/12/2010    154        ATP III CLASSIFICATION:  <200     mg/dL   Desirable  200-239  mg/dL   Borderline High  >=240    mg/dL   High          HDL 53 08/12/2010   LDLCALC  08/12/2010    81        Total Cholesterol/HDL:CHD Risk Coronary Heart Disease Risk Table                     Men   Women  1/2 Average Risk   3.4   3.3  Average Risk       5.0   4.4  2 X Average Risk   9.6   7.1  3 X Average Risk  23.4   11.0        Use the calculated Patient Ratio above and the CHD Risk Table to determine the patient's CHD Risk.        ATP III CLASSIFICATION (LDL):  <100     mg/dL   Optimal  100-129  mg/dL   Near or Above                    Optimal  130-159  mg/dL   Borderline  160-189  mg/dL   High  >190     mg/dL   Very High   TRIG 101 08/12/2010   Lab Results  Component Value Date   DDIMER (H) 12/23/2009    1.47        AT THE INHOUSE ESTABLISHED CUTOFF VALUE OF 0.48 ug/mL FEU, THIS ASSAY HAS BEEN DOCUMENTED IN THE LITERATURE TO HAVE A SENSITIVITY AND NEGATIVE PREDICTIVE VALUE OF AT LEAST 98 TO 99%.  THE TEST RESULT SHOULD BE CORRELATED WITH AN ASSESSMENT OF THE CLINICAL PROBABILITY OF DVT / VTE.     Radiology Studies    Dg Chest Portable 1 View  Result Date: 10/27/2016 CLINICAL DATA:  Left-side chest pain beginning yesterday. EXAM: PORTABLE CHEST 1 VIEW COMPARISON:  PA and lateral chest 08/28/2016 and 05/17/2015. FINDINGS: There is  cardiomegaly and vascular congestion. No consolidative process, pneumothorax or effusion. IMPRESSION: Cardiomegaly and pulmonary vascular congestion.  No acute disease. Electronically Signed   By: Inge Rise M.D.   On: 10/27/2016 09:54    EKG & Cardiac Imaging    EKG: Atrial fibrillation, HR 72, with no acute ST or T-wave changes. - Personally Reviewed  Echocardiogram: None on File  Assessment & Plan    1. Atypical Chest Pain - developed pleuritic chest pain along his left pectoral regional last night which was exacerbated by deep inspirations. Denies any associated dyspnea, nausea, vomiting, diaphoresis, or radiating pain. No association with exertion. Pain worsened by administration of SL NTG.  - BNP 140. Initial and repeat troponin values have been negative. EKG shows atrial fibrillation, HR 72, with no acute ST or T-wave changes.  - a VQ Scan has been ordered by the admitting team to rule-out a PE. In regards to further ischemic evaluation, a NST cannot be performed until 48 hours after his VQ Scan due to the radionucleotide uptake. If enzymes remain negative, would plan for a Lexiscan Myoview as an outpatient for this would be a 2-day study. Will obtain an echocardiogram tomorrow to assess LV function and wall motion. If EF significantly reduced, would benefit from a  cardiac catheterization with known coronary calcifications by CT. Otherwise, plan for outpatient NST.   2. CAD - s/p prior PCI to LAD in 1997. - EKG is without acute ischemic changes.  - continue BB and statin therapy. No ASA secondary to need for Xarelto.   3. Persistent Atrial Fibrillation - This patients CHA2DS2-VASc Score and unadjusted Ischemic Stroke Rate (% per year) is equal to 7.2 % stroke rate/year from a score of 5 (HTN, DM, Vascular, Age (2)). Continue Xarelto for anticoagulation. Would not hold at this time as ischemic evaluation is likely to be as an outpatient. - continue BB for rate-control.   4.  Chronic Lymphedema - legs are wrapped. Uses compression pumps four times per week.   5. Stage 3 CKD - baseline creatinine 1.2 - 1.3. - at 1.28 on admission.   6. Type 2 DM/ COPD - per admitting team  Signed, Erma Heritage, PA-C 10/27/2016, 4:45 PM Pager: [phone]

## 2016-10-27 NOTE — H&P (Signed)
[place] [facility] [place] NAME: Reginald [name]    MR#:  [id number]  DATE OF BIRTH:  [birth year]  DATE OF ADMISSION:  10/27/2016  PRIMARY CARE PHYSICIAN: Philmore Pali, NP   REQUESTING/REFERRING PHYSICIAN:   CHIEF COMPLAINT:   Chief Complaint  Patient presents with  . Chest Pain    HISTORY OF PRESENT ILLNESS: Reginald [name]  is a 81 y.o. male with a known history of multiple medical problems including chronic atrial fibrillation, lymphedema, leg ulcers, diabetes, obesity, hypertension, coronary artery disease, status post MI, status post PCI to LAD about 10 years ago, hypertension, hyperlipidemia, COPD, not on oxygen at home, who presents to the hospital with complaints of left-sided chest pain, shortness of breath. According to the patient, he was doing well up until last night, when he started feeling left-sided chest pain, pain was described as squeezing, 5 out of 10 by intensity, intermittent pain, increasing with breathing, with no radiation, it lasted approximately 6 hours. He was seen by a home health nurse yesterday and his legs were wrapped, he had no calf tenderness, his lower extremity swelling was about the same as it's always been. Because of recurrent chest pain. Patient decided to come to emergency room for further evaluation and hospitalist services were contacted for admission. EKG is unremarkable, but atrial fibrillation, old changes, chest x-ray revealed pulmonary vascular congestion. First troponin is negative  PAST MEDICAL HISTORY:   Past Medical History:  Diagnosis Date  . Anteroseptal myocardial infarction ([place])   . Atrial fibrillation ([place])   . CHF (congestive heart failure) ([place])   . Chronic kidney disease   . COPD (chronic obstructive pulmonary disease) ([place])   . Coronary atherosclerosis of unspecified type of vessel, native or graft   . Diverticulosis   . Glaucoma   . Hypercholesteremia   . Hypertensive renal  disease   . Hypokalemia   . Obesity hypoventilation syndrome ([place])   . Obesity, unspecified   . OSA (obstructive sleep apnea)   . Rhabdomyolysis   . Type II or unspecified type diabetes mellitus without mention of complication, not stated as uncontrolled   . Unspecified essential hypertension   . Vestibulopathy     PAST SURGICAL HISTORY: Past Surgical History:  Procedure Laterality Date  . APPENDECTOMY  1985  . CARDIAC CATHETERIZATION    . CHOLECYSTECTOMY  06/2000  . CORONARY ANGIOPLASTY  08/14/1995   stent placement to LAD   . INGUINAL HERNIA REPAIR Right 1985  . KNEE ARTHROSCOPY Left 1991  . LUMBAR FUSION    . NOSE SURGERY  1970s   Per Dr. Terrence Dupont [facility] in pt chart    SOCIAL HISTORY:  Social History  Substance Use Topics  . Smoking status: Never Smoker  . Smokeless tobacco: Never Used  . Alcohol use No    FAMILY HISTORY:  Family History  Problem Relation Age of Onset  . COPD Mother   . Diabetes Father   . Diabetes Sister   . Heart disease Brother   . Breast cancer Maternal Aunt   . Diabetes Paternal Grandmother   . Colon cancer Maternal Aunt   . Ovarian cancer Daughter     DRUG ALLERGIES:  Allergies  Allergen Reactions  . Adhesive [Tape] Rash    Review of Systems  Constitutional: Negative for chills, fever and weight loss.  HENT: Negative for congestion.   Eyes: Negative for blurred vision and double vision.  Respiratory: Positive for shortness of breath. Negative  for cough, sputum production and wheezing.   Cardiovascular: Positive for chest pain and leg swelling. Negative for palpitations, orthopnea and PND.  Gastrointestinal: Negative for abdominal pain, blood in stool, constipation, diarrhea, nausea and vomiting.  Genitourinary: Positive for frequency. Negative for dysuria, hematuria and urgency.  Musculoskeletal: Negative for falls.  Neurological: Negative for dizziness, tremors, focal weakness and headaches.  Endo/Heme/Allergies: Does not  bruise/bleed easily.  Psychiatric/Behavioral: Negative for depression. The patient does not have insomnia.     MEDICATIONS AT HOME:  Prior to Admission medications   Medication Sig Start Date End Date Taking? Authorizing Provider  atorvastatin (LIPITOR) 20 MG tablet Take 20 mg by mouth daily at 6 PM.    Yes Historical Provider, MD  cholecalciferol (VITAMIN D) 1000 UNITS tablet Take 1,000 Units by mouth daily.   Yes Historical Provider, MD  collagenase (SANTYL) ointment Apply topically daily. 05/18/15  Yes Charolette Forward, MD  escitalopram (LEXAPRO) 5 MG tablet Take 5 mg by mouth daily.   Yes Historical Provider, MD  furosemide (LASIX) 40 MG tablet Take 40 mg by mouth 2 (two) times daily. 40 mg in the morning and 40 mg at 2 pm.   Yes Historical Provider, MD  Lactobacillus (ACIDOPHILUS PROBIOTIC) 10 MG TABS Take 10 mg by mouth 3 (three) times daily. Patient taking differently: Take 10 mg by mouth at bedtime.  10/31/14  Yes Waynetta Pean, PA-C  losartan (COZAAR) 100 MG tablet Take 100 mg by mouth daily. 09/26/16  Yes Historical Provider, MD  Magnesium 400 MG TABS Take 400 mg by mouth daily.   Yes Historical Provider, MD  metoprolol tartrate (LOPRESSOR) 25 MG tablet Take 1 tablet (25 mg total) by mouth 2 (two) times daily. 05/18/15  Yes Charolette Forward, MD  nitroGLYCERIN (NITROSTAT) 0.4 MG SL tablet Place 0.4 mg under the tongue every 5 (five) minutes as needed for chest pain.   Yes Historical Provider, MD  polyethylene glycol (MIRALAX / GLYCOLAX) packet Take 17 g by mouth daily.   Yes Historical Provider, MD  potassium chloride SA (K-DUR,KLOR-CON) 20 MEQ tablet Take 60 mEq by mouth daily.    Yes Historical Provider, MD  saxagliptin HCl (ONGLYZA) 5 MG TABS tablet Take 5 mg by mouth daily.   Yes Historical Provider, MD  vitamin C (ASCORBIC ACID) 500 MG tablet Take 1,000 mg by mouth daily.   Yes Historical Provider, MD  XARELTO 15 MG TABS tablet Take 15 mg by mouth daily.  06/17/14  Yes Historical Provider,  MD      PHYSICAL EXAMINATION:   VITAL SIGNS: Pulse 73, temperature 98.6 F (37 C), temperature source Oral, resp. rate 20, height 5\' 9"  (1.753 m), weight (!) 138.8 kg (306 lb), SpO2 94 %.  GENERAL:  81 y.o.-year-old patient lying in the bed with no acute distress.  EYES: Pupils equal, round, reactive to light and accommodation. No scleral icterus. Extraocular muscles intact.  HEENT: Head atraumatic, normocephalic. Oropharynx and nasopharynx clear.  NECK:  Supple, no jugular venous distention. No thyroid enlargement, no tenderness.  LUNGS: Diminished breath sounds bilaterally, no wheezing, rales,rhonchi or crepitation. No use of accessory muscles of respiration.  CARDIOVASCULAR: S1, S2 normal. No murmurs, rubs, or gallops.  ABDOMEN: Soft, nontender, nondistended. Bowel sounds present. No organomegaly or mass.  EXTREMITIES: Not able to assess for pedal edema, cyanosis, or clubbing due to patient's lower extremities being wrapped with Kerlix wrap. No calf tenderness bilaterally, feet are very swollen NEUROLOGIC: Cranial nerves II through XII are intact. Muscle strength 5/5 in  all extremities. Sensation intact. Gait not checked.  PSYCHIATRIC: The patient is alert and oriented x 3.  SKIN: No obvious rash, lesion, or ulcer.   LABORATORY PANEL:   CBC  Recent Labs Lab 10/27/16 0935  WBC 8.9  HGB 15.5  HCT 47.0  PLT 166  MCV 84.2  MCH 27.7  MCHC 32.9  RDW 17.6*   ------------------------------------------------------------------------------------------------------------------  Chemistries   Recent Labs Lab 10/27/16 0935  NA 139  K 4.2  CL 106  CO2 27  GLUCOSE 140*  BUN 21*  CREATININE 1.28*  CALCIUM 9.0  AST 28  ALT 19  ALKPHOS 68  BILITOT 1.7*   ------------------------------------------------------------------------------------------------------------------  Cardiac Enzymes  Recent Labs Lab 10/27/16 0935  TROPONINI <0.03    ------------------------------------------------------------------------------------------------------------------  RADIOLOGY: Dg Chest Portable 1 View  Result Date: 10/27/2016 CLINICAL DATA:  Left-side chest pain beginning yesterday. EXAM: PORTABLE CHEST 1 VIEW COMPARISON:  PA and lateral chest 08/28/2016 and 05/17/2015. FINDINGS: There is cardiomegaly and vascular congestion. No consolidative process, pneumothorax or effusion. IMPRESSION: Cardiomegaly and pulmonary vascular congestion.  No acute disease. Electronically Signed   By: Inge Rise M.D.   On: 10/27/2016 09:54    EKG: Orders placed or performed during the hospital encounter of 10/27/16  . ED EKG  . ED EKG  . EKG 12-Lead  . EKG 12-Lead  EKG in the emergency room revealed atrial fibrillation at the rate of 72, anteroseptal infarct, old, baseline wander in the leads 2, 3 and aVF, per EKG criteria, no acute ST-T changes  IMPRESSION AND PLAN:  Active Problems:   Chest pain  #1 pleuritic chest pain,  Admit patient to the medical floor, get VQ scan stat, as patient's pain started after wrapping his lower extremities, get cardiologist involved, continue patient on metoprolol, Lipitor, aspirin #2. Chronic atrial fibrillation, rate controlled, continue metoprolol, Xarelto #3. Essential hypertension, continue outpatient medications, nitroglycerin topically #4. CK D stage III, seems to be stable from before #5. Diabetes mellitus, hemoglobin A1c, continue outpatient medications, sliding scale insulin #6. COPD, seems to be stable, adding nebulizers  All the records are reviewed and case discussed with ED provider. Management plans discussed with the patient, family and they are in agreement.  CODE STATUS: Code Status History    Date Active Date Inactive Code Status Order ID Comments User Context   05/08/2015  1:29 AM 05/18/2015  7:49 PM Full Code [id number]  Lavina Hamman, MD ED       TOTAL TIME TAKING CARE OF THIS  PATIENT: 50 minutes.    Theodoro Grist M.D on 10/27/2016 at 11:26 AM  Between 7am to 6pm - Pager - [phone] After 6pm go to [email or web address] - password [email or web address] [place] [facility]  Office  [phone]  CC: Primary care physician; Philmore Pali, NP

## 2016-10-27 NOTE — Telephone Encounter (Signed)
Pt daughter states the home health nurse was not able to get a BP yesterday. States his heart "just didn't feel right". She states he called 911 this morning and is in route to [facility] ED. She just wanted to let us know.

## 2016-10-28 ENCOUNTER — Observation Stay (HOSPITAL_BASED_OUTPATIENT_CLINIC_OR_DEPARTMENT_OTHER)
Admit: 2016-10-28 | Discharge: 2016-10-28 | Disposition: A | Discharge status: Home / Self Care | Payer: Medicare Other | Attending: Student | Admitting: Student

## 2016-10-28 ENCOUNTER — Telehealth: Payer: Self-pay | Admitting: Cardiovascular Disease

## 2016-10-28 ENCOUNTER — Other Ambulatory Visit: Payer: Self-pay

## 2016-10-28 DIAGNOSIS — I482 Chronic atrial fibrillation: Secondary | ICD-10-CM | POA: Diagnosis not present

## 2016-10-28 DIAGNOSIS — R0789 Other chest pain: Secondary | ICD-10-CM | POA: Diagnosis not present

## 2016-10-28 DIAGNOSIS — N183 Chronic kidney disease, stage 3 (moderate): Secondary | ICD-10-CM | POA: Diagnosis not present

## 2016-10-28 DIAGNOSIS — R079 Chest pain, unspecified: Secondary | ICD-10-CM

## 2016-10-28 DIAGNOSIS — R0781 Pleurodynia: Secondary | ICD-10-CM | POA: Diagnosis not present

## 2016-10-28 DIAGNOSIS — I10 Essential (primary) hypertension: Secondary | ICD-10-CM | POA: Diagnosis not present

## 2016-10-28 LAB — CBC
HCT: 48.1 % (ref 40.0–52.0)
Hemoglobin: 15.5 g/dL (ref 13.0–18.0)
MCH: 27.1 pg (ref 26.0–34.0)
MCHC: 32.2 g/dL (ref 32.0–36.0)
MCV: 84.2 fL (ref 80.0–100.0)
PLATELETS: 174 10*3/uL (ref 150–440)
RBC: 5.71 MIL/uL (ref 4.40–5.90)
RDW: 18.2 % — AB (ref 11.5–14.5)
WBC: 9.2 10*3/uL (ref 3.8–10.6)

## 2016-10-28 LAB — BASIC METABOLIC PANEL
Anion gap: 7 (ref 5–15)
BUN: 19 mg/dL (ref 6–20)
CALCIUM: 9.1 mg/dL (ref 8.9–10.3)
CHLORIDE: 106 mmol/L (ref 101–111)
CO2: 29 mmol/L (ref 22–32)
Creatinine, Ser: 1.34 mg/dL — ABNORMAL HIGH (ref 0.61–1.24)
GFR calc Af Amer: 57 mL/min — ABNORMAL LOW (ref >60)
GFR, EST NON AFRICAN AMERICAN: 49 mL/min — AB (ref >60)
Glucose, Bld: 145 mg/dL — ABNORMAL HIGH (ref 65–99)
Potassium: 3.5 mmol/L (ref 3.5–5.1)
SODIUM: 142 mmol/L (ref 135–145)

## 2016-10-28 LAB — ECHOCARDIOGRAM COMPLETE
HEIGHTINCHES: 69 in
WEIGHTICAEL: 4896 [oz_av]

## 2016-10-28 LAB — GLUCOSE, CAPILLARY
GLUCOSE-CAPILLARY: 140 mg/dL — AB (ref 65–99)
Glucose-Capillary: 127 mg/dL — ABNORMAL HIGH (ref 65–99)

## 2016-10-28 LAB — TROPONIN I: Troponin I: <0.03 ng/mL (ref <0.03)

## 2016-10-28 LAB — HEMOGLOBIN A1C
Hgb A1c MFr Bld: 6.9 % — ABNORMAL HIGH (ref 4.8–5.6)
MEAN PLASMA GLUCOSE: 151 mg/dL

## 2016-10-28 MED ORDER — AMLODIPINE BESYLATE 5 MG PO TABS: 5.0000 mg | ORAL_TABLET | Freq: Every day | ORAL | 1 refills | Status: DC

## 2016-10-28 NOTE — Telephone Encounter (Signed)
TCM.... Pt saw Bernerd Pho in hospital  Being discharged today Pt is coming on 11/03/16 to see Dr Rockey Situ at [facility]

## 2016-10-28 NOTE — Progress Notes (Signed)
*  PRELIMINARY RESULTS* Echocardiogram 2D Echocardiogram has been performed.  Sherrie Sport 10/28/2016, 8:41 AM

## 2016-10-28 NOTE — Discharge Summary (Signed)
[place] at [place] NAME: Reginald [name]    MR#:  [id number]  DATE OF BIRTH:  1935/07/24  DATE OF ADMISSION:  10/27/2016 ADMITTING PHYSICIAN: Theodoro Grist, MD  DATE OF DISCHARGE: 10/28/2016  PRIMARY CARE PHYSICIAN: Philmore Pali, NP    ADMISSION DIAGNOSIS:  Pleuritic chest pain [R07.81] Chest pain [R07.9] Nonspecific chest pain [R07.9]  DISCHARGE DIAGNOSIS:  Active Problems:   Chest pain   SECONDARY DIAGNOSIS:   Past Medical History:  Diagnosis Date  . Anteroseptal myocardial infarction ([place])   . Atrial fibrillation ([facility])    a. chronic, on Xarelto  . CHF (congestive heart failure) ([place])   . Chronic kidney disease   . COPD (chronic obstructive pulmonary disease) ([place])   . Coronary atherosclerosis of unspecified type of vessel, native or graft    a. s/p prior PCI to LAD in 1997  . Diverticulosis   . Glaucoma   . Hypercholesteremia   . Hypertensive renal disease   . Hypokalemia   . Obesity hypoventilation syndrome ([place])   . Obesity, unspecified   . OSA (obstructive sleep apnea)   . Rhabdomyolysis   . Type II or unspecified type diabetes mellitus without mention of complication, not stated as uncontrolled   . Unspecified essential hypertension   . Vestibulopathy     HOSPITAL COURSE:   81 year old male with past medical history of morbid obesity, obstructive sleep apnea, obesity hypoventilation syndrome, diabetes, essential hypertension, chronic atrial fibrillation, CHF, glaucoma who presented to the hospital due to chest pain.  1. Chest pain-given patient's significant cardiac history was observed overnight on telemetry, he had 3 sets of cardiac markers checked which were negative. He also underwent a nuclear medicine ventilation perfusion scan which showed no evidence of mismatch or pulmonary embolism. -He is currently chest pain-free and hemodynamically stable therefore being discharged home. He was going to have outpatient  stress test done by cardiology.  2. Essential hypertension-patient's blood pressure was slightly uncontrolled. He was maintained on his losartan and metoprolol and low-dose Norvasc was added to his regimen.  3. History of chronic atrial fibrillation-patient remained rate controlled and will continue metoprolol. -He will also continue Xarelto.  4. Hyperlipidemia-patient will continue his atorvastatin.  5. Depression-patient will resume his Lexapro.  DISCHARGE CONDITIONS:   Stable  CONSULTS OBTAINED:  Treatment Team:  Minna Merritts, MD  DRUG ALLERGIES:   Allergies  Allergen Reactions  . Adhesive [Tape] Rash    DISCHARGE MEDICATIONS:   Allergies as of 10/28/2016      Reactions   Adhesive [tape] Rash      Medication List    TAKE these medications   ACIDOPHILUS PROBIOTIC 10 MG Tabs Take 10 mg by mouth 3 (three) times daily. What changed:  when to take this   amLODipine 5 MG tablet Commonly known as:  NORVASC Take 1 tablet (5 mg total) by mouth daily.   atorvastatin 20 MG tablet Commonly known as:  LIPITOR Take 20 mg by mouth daily at 6 PM.   cholecalciferol 1000 units tablet Commonly known as:  VITAMIN D Take 1,000 Units by mouth daily.   collagenase ointment Commonly known as:  SANTYL Apply topically daily.   escitalopram 5 MG tablet Commonly known as:  LEXAPRO Take 5 mg by mouth daily.   furosemide 40 MG tablet Commonly known as:  LASIX Take 40 mg by mouth 2 (two) times daily. 40 mg in the morning and 40 mg at 2 pm.   losartan  100 MG tablet Commonly known as:  COZAAR Take 100 mg by mouth daily.   Magnesium 400 MG Tabs Take 400 mg by mouth daily.   metoprolol tartrate 25 MG tablet Commonly known as:  LOPRESSOR Take 1 tablet (25 mg total) by mouth 2 (two) times daily.   nitroGLYCERIN 0.4 MG SL tablet Commonly known as:  NITROSTAT Place 0.4 mg under the tongue every 5 (five) minutes as needed for chest pain.   polyethylene glycol  packet Commonly known as:  MIRALAX / GLYCOLAX Take 17 g by mouth daily.   potassium chloride SA 20 MEQ tablet Commonly known as:  K-DUR,KLOR-CON Take 60 mEq by mouth daily.   saxagliptin HCl 5 MG Tabs tablet Commonly known as:  ONGLYZA Take 5 mg by mouth daily.   vitamin C 500 MG tablet Commonly known as:  ASCORBIC ACID Take 1,000 mg by mouth daily.   XARELTO 15 MG Tabs tablet Generic drug:  Rivaroxaban Take 15 mg by mouth daily.         DISCHARGE INSTRUCTIONS:   DIET:  Cardiac diet and Diabetic diet  DISCHARGE CONDITION:  Stable  ACTIVITY:  Activity as tolerated  OXYGEN:  Home Oxygen: No.   Oxygen Delivery: room air  DISCHARGE LOCATION:  home   If you experience worsening of your admission symptoms, develop shortness of breath, life threatening emergency, suicidal or homicidal thoughts you must seek medical attention immediately by calling 911 or calling your MD immediately  if symptoms less severe.  You Must read complete instructions/literature along with all the possible adverse reactions/side effects for all the Medicines you take and that have been prescribed to you. Take any new Medicines after you have completely understood and accpet all the possible adverse reactions/side effects.   Please note  You were cared for by a hospitalist during your hospital stay. If you have any questions about your discharge medications or the care you received while you were in the hospital after you are discharged, you can call the unit and asked to speak with the hospitalist on call if the hospitalist that took care of you is not available. Once you are discharged, your primary care physician will handle any further medical issues. Please note that NO REFILLS for any discharge medications will be authorized once you are discharged, as it is imperative that you return to your primary care physician (or establish a relationship with a primary care physician if you do not have  one) for your aftercare needs so that they can reassess your need for medications and monitor your lab values.     Today   Chest pain now resolved, blood pressure improved. No other acute events or complaints presently.  VITAL SIGNS:   Vitals:   10/28/16 0533 10/28/16 1205  BP: (!) 169/65 (!) 173/79  Pulse: 85 (!) 149  Resp: 16 20  Temp: 98.3 F (36.8 C) 97.9 F (36.6 C)     I/O:   Intake/Output Summary (Last 24 hours) at 10/28/16 1509 Last data filed at 10/28/16 1300  Gross per 24 hour  Intake              723 ml  Output              700 ml  Net               23 ml    PHYSICAL EXAMINATION:   GENERAL:  81 y.o.-year-old obese patient lying in bed in no acute distress.  EYES:  Pupils equal, round, reactive to light and accommodation. No scleral icterus. Extraocular muscles intact.  HEENT: Head atraumatic, normocephalic. Oropharynx and nasopharynx clear.  NECK:  Supple, no jugular venous distention. No thyroid enlargement, no tenderness.  LUNGS: Normal breath sounds bilaterally, no wheezing, rales,rhonchi. No use of accessory muscles of respiration.  CARDIOVASCULAR: S1, S2 normal. No murmurs, rubs, or gallops.  ABDOMEN: Soft, non-tender, non-distended. Bowel sounds present. No organomegaly or mass.  EXTREMITIES: No pedal edema, cyanosis, or clubbing.  NEUROLOGIC: Cranial nerves II through XII are intact. No focal motor or sensory defecits b/l.  PSYCHIATRIC: The patient is alert and oriented x 3. Good affect.  SKIN: No obvious rash, lesion, or ulcer.   DATA REVIEW:   CBC  Recent Labs Lab 10/28/16 0617  WBC 9.2  HGB 15.5  HCT 48.1  PLT 174    Chemistries   Recent Labs Lab 10/27/16 0935 10/28/16 0617  NA 139 142  K 4.2 3.5  CL 106 106  CO2 27 29  GLUCOSE 140* 145*  BUN 21* 19  CREATININE 1.28* 1.34*  CALCIUM 9.0 9.1  AST 28  --   ALT 19  --   ALKPHOS 68  --   BILITOT 1.7*  --     Cardiac Enzymes  Recent Labs Lab 10/28/16 0617  TROPONINI  <0.03    RADIOLOGY:  Nm Pulmonary Perf And Vent  Result Date: 10/27/2016 CLINICAL DATA:  81 year old male with shortness of breath for several days. Pleuritic chest pain. Initial encounter. EXAM: NUCLEAR MEDICINE VENTILATION - PERFUSION LUNG SCAN TECHNIQUE: Ventilation images were obtained in multiple projections using inhaled aerosol Tc-31m DTPA. Perfusion images were obtained in multiple projections after intravenous injection of Tc-71m MAA. RADIOPHARMACEUTICALS:  31.5 mCi Technetium-84m DTPA aerosol inhalation and 4.1 mCi Technetium-62m MAA IV COMPARISON:  Portable chest radiograph 0924 hours today. FINDINGS: Ventilation: Artifactual clumping of the radiotracer about the central airways. Artifactual gastrointestinal activity. No definite ventilation defect. Perfusion: Homogeneous and normal. IMPRESSION: No perfusion defect to suggest acute pulmonary embolus. Electronically Signed   By: Genevie Ann M.D.   On: 10/27/2016 16:16   Dg Chest Portable 1 View  Result Date: 10/27/2016 CLINICAL DATA:  Left-side chest pain beginning yesterday. EXAM: PORTABLE CHEST 1 VIEW COMPARISON:  PA and lateral chest 08/28/2016 and 05/17/2015. FINDINGS: There is cardiomegaly and vascular congestion. No consolidative process, pneumothorax or effusion. IMPRESSION: Cardiomegaly and pulmonary vascular congestion.  No acute disease. Electronically Signed   By: Inge Rise M.D.   On: 10/27/2016 09:54      Management plans discussed with the patient, family and they are in agreement.  CODE STATUS:     Code Status Orders        Start     Ordered   10/27/16 1404  Full code  Continuous     10/27/16 1403    Code Status History    Date Active Date Inactive Code Status Order ID Comments User Context   05/08/2015  1:29 AM 05/18/2015  7:49 PM Full Code [id number]  Lavina Hamman, MD ED      TOTAL TIME TAKING CARE OF THIS PATIENT: 40 minutes.    Henreitta Leber M.D on 10/28/2016 at 3:09 PM  Between 7am to 6pm -  Pager - [phone]  After 6pm go to [email or web address] - Proofreader  [facility]  Office  [phone]  CC: Primary care physician; Philmore Pali, NP

## 2016-10-28 NOTE — Care Management (Signed)
Placed in observation for chest pain. Currently open to [place].  Notified agency of observation status

## 2016-10-28 NOTE — Discharge Instructions (Signed)

## 2016-10-28 NOTE — Progress Notes (Signed)
Progress Note  Patient Name: Reginald [name] Date of Encounter: 10/28/2016  Primary Cardiologist: Dr. Rockey Situ  Subjective   He denies any recurrent chest pain. Breathing at baseline. No orthopnea, PND, or palpitations.    Inpatient Medications    Scheduled Meds: . aspirin EC  81 mg Oral Daily  . atorvastatin  20 mg Oral q1800  . docusate sodium  100 mg Oral BID  . escitalopram  5 mg Oral Daily  . furosemide  40 mg Oral BID  . insulin aspart  0-20 Units Subcutaneous TID WC  . insulin aspart  0-5 Units Subcutaneous QHS  . insulin aspart  6 Units Subcutaneous TID WC  . ipratropium-albuterol  3 mL Nebulization Q4H  . linagliptin  5 mg Oral Daily  . losartan  100 mg Oral Daily  . metoprolol tartrate  25 mg Oral BID  . polyethylene glycol  17 g Oral Daily  . potassium chloride SA  60 mEq Oral Daily  . Rivaroxaban  15 mg Oral Daily  . sodium chloride flush  3 mL Intravenous Q12H  . sodium chloride flush  3 mL Intravenous Q12H   Continuous Infusions: . sodium chloride     PRN Meds: sodium chloride, acetaminophen **OR** acetaminophen, nitroGLYCERIN, ondansetron **OR** ondansetron (ZOFRAN) IV, sodium chloride flush   Vital Signs    Vitals:   10/27/16 2106 10/28/16 0001 10/28/16 0453 10/28/16 0533  BP:    (!) 169/65  Pulse:    85  Resp:    16  Temp:    98.3 F (36.8 C)  TempSrc:    Oral  SpO2: 94% 95% 92% 95%  Weight:      Height:        Intake/Output Summary (Last 24 hours) at 10/28/16 0823 Last data filed at 10/28/16 0533  Gross per 24 hour  Intake              243 ml  Output              400 ml  Net             -157 ml   Filed Weights   10/27/16 0936  Weight: (!) 306 lb (138.8 kg)    Telemetry    Atrial fibrillation, HR in 60's - 70's, increasing into the 110's with activity.  - Personally Reviewed  ECG    No new tracings.   Physical Exam   General: Well developed, obese Caucasian male appearing in no acute distress. Head: Normocephalic,  atraumatic.  Neck: Supple without bruits, JVD at 9cm. Lungs:  Resp regular and unlabored, mild expiratory wheezing in upper lung fields. Heart: Irregularly irregular, S1, S2, no S3, S4, or murmur; no rub. Abdomen: Soft, non-tender, non-distended with normoactive bowel sounds. No hepatomegaly. No rebound/guarding. No obvious abdominal masses. Extremities: No clubbing or cyanosis. Unna boots in place. Distal pedal pulses are 2+ bilaterally. Neuro: Alert and oriented X 3. Moves all extremities spontaneously. Psych: Normal affect.  Labs    Chemistry Recent Labs Lab 10/27/16 0935 10/28/16 0617  NA 139 142  K 4.2 3.5  CL 106 106  CO2 27 29  GLUCOSE 140* 145*  BUN 21* 19  CREATININE 1.28* 1.34*  CALCIUM 9.0 9.1  PROT 6.3*  --   ALBUMIN 3.5  --   AST 28  --   ALT 19  --   ALKPHOS 68  --   BILITOT 1.7*  --   GFRNONAA 52* 49*  GFRAA >60 57*  ANIONGAP 6 7     Hematology Recent Labs Lab 10/27/16 0935 10/28/16 0617  WBC 8.9 9.2  RBC 5.58 5.71  HGB 15.5 15.5  HCT 47.0 48.1  MCV 84.2 84.2  MCH 27.7 27.1  MCHC 32.9 32.2  RDW 17.6* 18.2*  PLT 166 174    Cardiac Enzymes Recent Labs Lab 10/27/16 0935 10/27/16 1423 10/28/16 0617  TROPONINI <0.03 <0.03 <0.03   No results for input(s): TROPIPOC in the last 168 hours.   BNP Recent Labs Lab 10/27/16 0935  BNP 140.0*     DDimer No results for input(s): DDIMER in the last 168 hours.   Radiology    Nm Pulmonary Perf And Vent  Result Date: 10/27/2016 CLINICAL DATA:  81 year old male with shortness of breath for several days. Pleuritic chest pain. Initial encounter. EXAM: NUCLEAR MEDICINE VENTILATION - PERFUSION LUNG SCAN TECHNIQUE: Ventilation images were obtained in multiple projections using inhaled aerosol Tc-27m DTPA. Perfusion images were obtained in multiple projections after intravenous injection of Tc-44m MAA. RADIOPHARMACEUTICALS:  31.5 mCi Technetium-66m DTPA aerosol inhalation and 4.1 mCi Technetium-85m MAA IV  COMPARISON:  Portable chest radiograph 0924 hours today. FINDINGS: Ventilation: Artifactual clumping of the radiotracer about the central airways. Artifactual gastrointestinal activity. No definite ventilation defect. Perfusion: Homogeneous and normal. IMPRESSION: No perfusion defect to suggest acute pulmonary embolus. Electronically Signed   By: Genevie Ann M.D.   On: 10/27/2016 16:16   Dg Chest Portable 1 View  Result Date: 10/27/2016 CLINICAL DATA:  Left-side chest pain beginning yesterday. EXAM: PORTABLE CHEST 1 VIEW COMPARISON:  PA and lateral chest 08/28/2016 and 05/17/2015. FINDINGS: There is cardiomegaly and vascular congestion. No consolidative process, pneumothorax or effusion. IMPRESSION: Cardiomegaly and pulmonary vascular congestion.  No acute disease. Electronically Signed   By: Inge Rise M.D.   On: 10/27/2016 09:54    Cardiac Studies   Echocardiogram: Pending  Patient Profile     81 y.o. male w/ PMH of CAD (s/p prior PCI to LAD in 1997), PAD, chronic atrial fibrillation (on Xarelto), lymphedema, Type 2 DM, HLD, COPD and Stage 3 CKD who presented to [facility] on 10/27/2016 for evaluation of chest pain. Cyclic enzymes negative and chest pain thought to be atypical for ACS.   Assessment & Plan    1. Atypical Chest Pain - developed pleuritic chest pain along his left pectoral regional the night prior to admission which was exacerbated by deep inspirations. Denied any associated dyspnea, nausea, vomiting, diaphoresis, or radiating pain. No association with exertion. Pain worsened by administration of SL NTG.  - cyclic troponin values have been negative. EKG shows atrial fibrillation, HR 72, with no acute ST or T-wave changes.  - a VQ Scan was obtained and negative for a PE. In regards to further ischemic evaluation, a NST cannot be performed until 48 hours after his VQ Scan due to the radionucleotide uptake. With enzymes remaining negative and resolved symptoms, would plan for a Lexiscan  Myoview as an outpatient for this would be a 2-day study. Echo to be obtained today to assess LV function and wall motion. If EF significantly reduced, would benefit from a cardiac catheterization with known coronary calcifications by CT. Otherwise, plan for outpatient NST as above.   2. CAD - s/p prior PCI to LAD in 1997. - EKG is without acute ischemic changes.  - continue BB and statin therapy. No ASA secondary to need for Xarelto.   3. Persistent Atrial Fibrillation - This patients CHA2DS2-VASc Score and unadjusted Ischemic Stroke Rate (%  per year) is equal to 7.2 % stroke rate/year from a score of 5 (HTN, DM, Vascular, Age (2)). Continue Xarelto for anticoagulation. Would not hold at this time as ischemic evaluation is likely to be as an outpatient. - continue BB for rate-control.   4. Chronic Lymphedema - legs are wrapped. Uses compression pumps four times per week.   5. Stage 3 CKD - baseline creatinine 1.2 - 1.3. - at 1.34 today.   6. Type 2 DM/ COPD - per admitting team  Signed, Erma Heritage , PA-C 8:23 AM 10/28/2016 Pager: [phone]

## 2016-10-28 NOTE — Care Management Obs Status (Signed)
[facility] NOTIFICATION   Patient Details  Name: Reginald [name] MRN: [id number] Date of Birth: [birth year]   Medicare Observation Status Notification Given:  Yes    Katrina Stack, RN 10/28/2016, 10:22 AM

## 2016-10-28 NOTE — Care Management (Signed)
[facility] notified of discharge.  Resumption of care orders obtaine

## 2016-10-28 NOTE — Consult Note (Signed)
[facility] Nurse wound consult note Reason for Consult: Bilateral lymphedema, wears removable compression three days/week at home.  Recently, had a 1 cm x 1 cm x 0.1 cm lesion come up on bilateral lower legs.,  Has been wearing weekly Unnas boots and wounds are early re-epithelialized.  This was to be the last week of this therapy.  Due to be removed on Tuesday, 11/01/16.  Wraps are dry and intact and we will leave them in place at this time.  Wound type: Chronic venous insufficiency with lymphedema Pressure Injury POA: N/A Will not follow at this time.  Please re-consult if needed.  Domenic Moras RN BSN [place] Pager [phone]

## 2016-10-28 NOTE — Telephone Encounter (Signed)
Per staff message, pt will also need Lexiscan myoview in 1-2 weeks. Pt will need 2 day study due to wt of 306. Order placed.  Pt to be discharged this afternoon.

## 2016-10-28 NOTE — Telephone Encounter (Signed)
Patient contacted regarding discharge from [facility] on 10/28/16.  Patient understands to follow up with Dr. Rockey Situ on 11/03/16 at 3:00 at [facility]. Patient understands discharge instructions? yes Patient understands medications and regiment? yes Patient understands to bring all medications to this visit? yes  Attempted to sched pt's Lexiscan, but would like for me to set this up w/ his daughter to accommodate her schedule, as she will be bringing him.  He will have her call back to set this up.

## 2016-11-01 DIAGNOSIS — L97211 Non-pressure chronic ulcer of right calf limited to breakdown of skin: Secondary | ICD-10-CM | POA: Diagnosis not present

## 2016-11-01 DIAGNOSIS — L97822 Non-pressure chronic ulcer of other part of left lower leg with fat layer exposed: Secondary | ICD-10-CM | POA: Diagnosis not present

## 2016-11-01 DIAGNOSIS — I872 Venous insufficiency (chronic) (peripheral): Secondary | ICD-10-CM | POA: Diagnosis not present

## 2016-11-01 DIAGNOSIS — E1122 Type 2 diabetes mellitus with diabetic chronic kidney disease: Secondary | ICD-10-CM | POA: Diagnosis not present

## 2016-11-01 DIAGNOSIS — I890 Lymphedema, not elsewhere classified: Secondary | ICD-10-CM | POA: Diagnosis not present

## 2016-11-01 DIAGNOSIS — L97222 Non-pressure chronic ulcer of left calf with fat layer exposed: Secondary | ICD-10-CM | POA: Diagnosis not present

## 2016-11-02 NOTE — Progress Notes (Deleted)
Cardiology Office Note  Date:  11/02/2016   ID:  Reginald [name], Reginald [name] Sep 17, 1935, MRN [id number]  PCP:  Reginald Pali, NP   No chief complaint on file.   HPI:  [name] Is a 81 year old gentleman with long history of lymphedema,   lower extremity ulcers,  diabetes,  obesity,  hypertension,  coronary artery disease,  PCI to LAD over 10 years ago atrial fibrillation (on anticoagulation), on xarelto hyperlipidemia,  COPD,  CHF,  PAD,  Bilateral moderate popliteal stenosis (moderate to severe left popliteal artery stenosis),   Chronic renal insufficiency in the setting of outflow tract obstruction November 2016 creatinine up to 4 (now down to 1.37) Who presents  for follow up of his leg swelling/lymphedema, coronary disease  Hospital admission 10/27/2016 for chest pain, pleuritic Negative cardiac enzymes VQ perfusion scan with no PE Recommendation for outpatient stress test   Reports he had unaboots wrapped/placed past few weeks He is using his Lymphedema compression pumps 4 days per week one hour at a time Has a person aid 3 days a week that helped some but the more  Daughter comes 2 days a week  Previous ulcerations have healed, blistering resolved Tried to follow leg elevation protocol  Continues to take Lasix 40 mg twice a day drinks lots of fluid especially in the evening, up 3 times a night to go urinate   Recent visit to the emergency room for chest pain Records reviewed, felt to be atypical in nature Lab work reviewed with him troponin negative, BMP stable He had eaten a whole pizza in 2 separate sittings then developed some chest tightness Suspect secondary to indigestion  Recent recommendation by outside physician to hold his metoprolol, start propranolol for tremor He has not started this yet, propranolol dose unclear, possibly extended release 120 mg daily  Continues to have gait instability, no recent falls, walks with a walker  EKG shows atrial  fibrillation with ventricular rate 78 bpm, unable to exclude old anteroseptal infarct  Other past medical history reviewed  Echocardiogram June 2011 showing normal LV function, right ventricular systolic pressure 40 ABIs done January 2017 0.9 bilaterally  Stress test 2/ 2012, mild fixed defect anterior wall, ejection fraction 58%  CT scan lower extremities March 2017 Bilateral outflow disease, moderate to severe stenosis distal left popliteal artery, Coronary calcifications noted Extensive subcutaneous edema  PMH:   has a past medical history of Anteroseptal myocardial infarction ([place]); Atrial fibrillation ([place]); CHF (congestive heart failure) ([place]); Chronic kidney disease; COPD (chronic obstructive pulmonary disease) ([place]); Coronary atherosclerosis of unspecified type of vessel, native or graft; Diverticulosis; Glaucoma; Hypercholesteremia; Hypertensive renal disease; Hypokalemia; Obesity hypoventilation syndrome ([place]); Obesity, unspecified; OSA (obstructive sleep apnea); Rhabdomyolysis; Type II or unspecified type diabetes mellitus without mention of complication, not stated as uncontrolled; Unspecified essential hypertension; and Vestibulopathy.  PSH:    Past Surgical History:  Procedure Laterality Date  . APPENDECTOMY  1985  . CARDIAC CATHETERIZATION    . CHOLECYSTECTOMY  06/2000  . CORONARY ANGIOPLASTY  08/14/1995   stent placement to LAD   . INGUINAL HERNIA REPAIR Right 1985  . KNEE ARTHROSCOPY Left 1991  . LUMBAR FUSION    . NOSE SURGERY  1970s   Per Dr. Terrence [name] [facility] in pt chart    Current Outpatient Prescriptions  Medication Sig Dispense Refill  . amLODipine (NORVASC) 5 MG tablet Take 1 tablet (5 mg total) by mouth daily. 30 tablet 1  . atorvastatin (LIPITOR) 20 MG tablet Take 20 mg by  mouth daily at 6 PM.     . cholecalciferol (VITAMIN D) 1000 UNITS tablet Take 1,000 Units by mouth daily.    . collagenase (SANTYL) ointment Apply topically daily. 15 g 0  . escitalopram  (LEXAPRO) 5 MG tablet Take 5 mg by mouth daily.    . furosemide (LASIX) 40 MG tablet Take 40 mg by mouth 2 (two) times daily. 40 mg in the morning and 40 mg at 2 pm.    . Lactobacillus (ACIDOPHILUS PROBIOTIC) 10 MG TABS Take 10 mg by mouth 3 (three) times daily. (Patient taking differently: Take 10 mg by mouth at bedtime. ) 90 tablet 0  . losartan (COZAAR) 100 MG tablet Take 100 mg by mouth daily.    . Magnesium 400 MG TABS Take 400 mg by mouth daily.    . metoprolol tartrate (LOPRESSOR) 25 MG tablet Take 1 tablet (25 mg total) by mouth 2 (two) times daily. 60 tablet 3  . nitroGLYCERIN (NITROSTAT) 0.4 MG SL tablet Place 0.4 mg under the tongue every 5 (five) minutes as needed for chest pain.    . polyethylene glycol (MIRALAX / GLYCOLAX) packet Take 17 g by mouth daily.    . potassium chloride SA (K-DUR,KLOR-CON) 20 MEQ tablet Take 60 mEq by mouth daily.     . saxagliptin HCl (ONGLYZA) 5 MG TABS tablet Take 5 mg by mouth daily.    . vitamin C (ASCORBIC ACID) 500 MG tablet Take 1,000 mg by mouth daily.    Alveda Reasons 15 MG TABS tablet Take 15 mg by mouth daily.      No current facility-administered medications for this visit.      Allergies:   Adhesive [tape]   Social History:  The patient  reports that he has never smoked. He has never used smokeless tobacco. He reports that he does not drink alcohol or use drugs.   Family History:   family history includes Breast cancer in his maternal aunt; COPD in his mother; Colon cancer in his maternal aunt; Diabetes in his father, paternal grandmother, and sister; Heart disease in his brother; Ovarian cancer in his daughter.    Review of Systems: Review of Systems  Constitutional: Negative.   Respiratory: Negative.   Cardiovascular: Positive for leg swelling.  Gastrointestinal: Negative.   Musculoskeletal: Negative.        Gait instability  Neurological: Negative.   Psychiatric/Behavioral: Negative.   All other systems reviewed and are  negative.    PHYSICAL EXAM: VS:  There were no vitals taken for this visit. , BMI There is no height or weight on file to calculate BMI. GEN: Well nourished, well developed, in no acute distress ,  obese  HEENT: normal  Neck: no JVD, carotid bruits, or masses Cardiac: Irregular rate and rhythm, ; no murmurs, rubs, or gallops, bilateral leg wraps in place , appears to have trace pitting edema lower extremities bilaterally mid shins down  Respiratory:  clear to auscultation bilaterally, normal work of breathing GI: soft, nontender, nondistended, + BS MS: no deformity or atrophy  Skin: warm and dry, no rash Neuro:  Strength and sensation are intact Psych: euthymic mood, full affect    Recent Labs: 10/27/2016: ALT 19; B Natriuretic Peptide 140.0 10/28/2016: BUN 19; Creatinine, Ser 1.34; Hemoglobin 15.5; Platelets 174; Potassium 3.5; Sodium 142    Lipid Panel Lab Results  Component Value Date   CHOL  08/12/2010    154        ATP III CLASSIFICATION:  <200  mg/dL   Desirable  200-239  mg/dL   Borderline High  >=240    mg/dL   High          HDL 53 08/12/2010   LDLCALC  08/12/2010    81        Total Cholesterol/HDL:CHD Risk Coronary Heart Disease Risk Table                     Men   Women  1/2 Average Risk   3.4   3.3  Average Risk       5.0   4.4  2 X Average Risk   9.6   7.1  3 X Average Risk  23.4   11.0        Use the calculated Patient Ratio above and the CHD Risk Table to determine the patient's CHD Risk.        ATP III CLASSIFICATION (LDL):  <100     mg/dL   Optimal  100-129  mg/dL   Near or Above                    Optimal  130-159  mg/dL   Borderline  160-189  mg/dL   High  >190     mg/dL   Very High   TRIG 101 08/12/2010      Wt Readings from Last 3 Encounters:  10/28/16 (!) 301 lb 6.4 oz (136.7 kg)  09/05/16 (!) 302 lb 8 oz (137.2 kg)  08/11/16 300 lb (136.1 kg)       ASSESSMENT AND PLAN:  Chronic atrial fibrillation (HCC) - Plan: EKG  12-Lead Rate well controlled, tolerating anticoagulation  Diastolic dysfunction with chronic heart failure (HCC) - Plan: EKG 12-Lead Tolerating Lasix 40 mg twice a day with stable weight, stable renal function Discussed moderating his fluid intake, monitoring weight, adjusting his diuretic regiment for weight gain  Hypertensive heart disease with CHF (congestive heart failure) ([place]) - Plan: EKG 12-Lead Blood pressure is well controlled on today's visit. No changes made to the medications.  Coronary artery disease involving native coronary artery of native heart with other form of angina pectoris ([place]) Currently with no symptoms of angina. No further workup at this time. Continue current medication regimen.  Mixed hyperlipidemia Cholesterol is at goal on the current lipid regimen. No changes to the medications were made.  Secondary DM with CKD stage 4 and hypertension (HCC) Hemoglobin A1c 6.7 Discussed his diet with him, recommended low bread, to avoid pizza  Lymphedema Long time spent today discussing his therapy Improved symptoms with leg wraps  also using his lymphedema compression pumps He will try to use his pumps as often as possible though he is unable to place these himself He is trying to buy Velcro compression hose  Morbid obesity ([place]) We have encouraged continued exercise, careful diet management in an effort to lose weight.  Long discussion with patient and patient's daughter, all questions answered concerning above  Total encounter time more than 45 minutes  Greater than 50% was spent in counseling and coordination of care with the patient   Disposition:   F/U  6 months   No orders of the defined types were placed in this encounter.    Signed, Esmond Plants, M.D., Ph.D. 11/02/2016  [place], [place]

## 2016-11-03 ENCOUNTER — Encounter: Payer: Medicare Other | Admitting: Cardiovascular Disease

## 2016-11-08 DIAGNOSIS — E1122 Type 2 diabetes mellitus with diabetic chronic kidney disease: Secondary | ICD-10-CM | POA: Diagnosis not present

## 2016-11-08 DIAGNOSIS — L97822 Non-pressure chronic ulcer of other part of left lower leg with fat layer exposed: Secondary | ICD-10-CM | POA: Diagnosis not present

## 2016-11-08 DIAGNOSIS — I872 Venous insufficiency (chronic) (peripheral): Secondary | ICD-10-CM | POA: Diagnosis not present

## 2016-11-08 DIAGNOSIS — L97211 Non-pressure chronic ulcer of right calf limited to breakdown of skin: Secondary | ICD-10-CM | POA: Diagnosis not present

## 2016-11-08 DIAGNOSIS — I890 Lymphedema, not elsewhere classified: Secondary | ICD-10-CM | POA: Diagnosis not present

## 2016-11-08 DIAGNOSIS — L97222 Non-pressure chronic ulcer of left calf with fat layer exposed: Secondary | ICD-10-CM | POA: Diagnosis not present

## 2016-11-15 DIAGNOSIS — I872 Venous insufficiency (chronic) (peripheral): Secondary | ICD-10-CM | POA: Diagnosis not present

## 2016-11-15 DIAGNOSIS — I890 Lymphedema, not elsewhere classified: Secondary | ICD-10-CM | POA: Diagnosis not present

## 2016-11-15 DIAGNOSIS — L97211 Non-pressure chronic ulcer of right calf limited to breakdown of skin: Secondary | ICD-10-CM | POA: Diagnosis not present

## 2016-11-15 DIAGNOSIS — L97222 Non-pressure chronic ulcer of left calf with fat layer exposed: Secondary | ICD-10-CM | POA: Diagnosis not present

## 2016-11-15 DIAGNOSIS — L97822 Non-pressure chronic ulcer of other part of left lower leg with fat layer exposed: Secondary | ICD-10-CM | POA: Diagnosis not present

## 2016-11-15 DIAGNOSIS — E1122 Type 2 diabetes mellitus with diabetic chronic kidney disease: Secondary | ICD-10-CM | POA: Diagnosis not present

## 2016-11-20 NOTE — Progress Notes (Signed)
Cardiology Office Note  Date:  11/22/2016   ID:  Reginald [name], DOB 04/12/1936, MRN [id number]  PCP:  Philmore Pali, NP   Chief Complaint  Patient presents with  . other    F/u hospital due to chest pain c/o swelling. Meds reviewed verbally with pt.     HPI:  Reginald [name] Is a 81 year old gentleman with long history of  lymphedema,   lower extremity ulcers,  diabetes,  obesity,  hypertension,  coronary artery disease, PCI to LAD over 10 years ago atrial fibrillation (on anticoagulation),  hyperlipidemia,  COPD,  CHF,  PAD, Bilateral moderate popliteal stenosis (moderate to severe left popliteal artery stenosis),   Chronic renal insufficiency in the setting of outflow tract obstruction November 2016 creatinine up to 4 (now down to 1.37) Who presents  for follow up of his leg swelling/lymphedema, coronary disease  He was in the [facility] 10/27/2016 Had symptoms of Chest pain 3 sets of cardiac markers checked which were negative. nuclear medicine ventilation perfusion scan which showed no evidence of mismatch or pulmonary embolism.  In follow-up he reports his leg swelling is worse He is very sedentary, its for much of the day Continues to have gait instability, no recent falls, walks with a walker Only only using his lymphedema compression boots a few times a week Does not feel that he can get his compression boots on. Relies on family and visiting nurses to put these on for him. Unable to reach his feet and pull on the zipper  Legs currently in wound wrap High fluid intake Likely missing some of his Lasix dosing Lots of water with lemonade powder  Prior history of ulcerations, blistering  Results of echocardiogram reviewed with him showing moderate pulmonary hypertension  Previous trip to the emergency room for chest pain Records reviewed, felt to be atypical in nature Lab work reviewed with him troponin negative, BMP stable He had eaten a whole pizza in 2 separate  sittings then developed some chest tightness Suspect secondary to indigestion  EKG shows atrial fibrillation,  unable to exclude old anteroseptal infarct  Other past medical history reviewed  Echocardiogram June 2011 showing normal LV function, right ventricular systolic pressure 40 ABIs done January 2017 0.9 bilaterally  Stress test 2/ 2012, mild fixed defect anterior wall, ejection fraction 58%  CT scan lower extremities March 2017 Bilateral outflow disease, moderate to severe stenosis distal left popliteal artery, Coronary calcifications noted Extensive subcutaneous edema  PMH:   has a past medical history of Anteroseptal myocardial infarction ([place]); Atrial fibrillation ([place]); CHF (congestive heart failure) ([place]); Chronic kidney disease; COPD (chronic obstructive pulmonary disease) ([place]); Coronary atherosclerosis of unspecified type of vessel, native or graft; Diverticulosis; Glaucoma; Hypercholesteremia; Hypertensive renal disease; Hypokalemia; Obesity hypoventilation syndrome ([place]); Obesity, unspecified; OSA (obstructive sleep apnea); Rhabdomyolysis; Type II or unspecified type diabetes mellitus without mention of complication, not stated as uncontrolled; Unspecified essential hypertension; and Vestibulopathy.  PSH:    Past Surgical History:  Procedure Laterality Date  . APPENDECTOMY  1985  . CARDIAC CATHETERIZATION    . CHOLECYSTECTOMY  06/2000  . CORONARY ANGIOPLASTY  08/14/1995   stent placement to LAD   . INGUINAL HERNIA REPAIR Right 1985  . KNEE ARTHROSCOPY Left 1991  . LUMBAR FUSION    . NOSE SURGERY  1970s   Per Dr. Terrence Dupont [facility] in pt chart    Current Outpatient Prescriptions  Medication Sig Dispense Refill  . amLODipine (NORVASC) 5 MG tablet Take 1 tablet (5 mg total) by  mouth daily. 30 tablet 1  . atorvastatin (LIPITOR) 20 MG tablet Take 20 mg by mouth daily at 6 PM.     . cholecalciferol (VITAMIN D) 1000 UNITS tablet Take 1,000 Units by mouth daily.    .  collagenase (SANTYL) ointment Apply topically daily. 15 g 0  . escitalopram (LEXAPRO) 5 MG tablet Take 5 mg by mouth daily.    . furosemide (LASIX) 40 MG tablet Take 40 mg by mouth 2 (two) times daily. 40 mg in the morning and 40 mg at 2 pm.    . Lactobacillus (ACIDOPHILUS PROBIOTIC) 10 MG TABS Take 10 mg by mouth 3 (three) times daily. (Patient taking differently: Take 10 mg by mouth at bedtime. ) 90 tablet 0  . Magnesium 400 MG TABS Take 400 mg by mouth daily.    . metoprolol tartrate (LOPRESSOR) 25 MG tablet Take 1 tablet (25 mg total) by mouth 2 (two) times daily. 60 tablet 3  . nitroGLYCERIN (NITROSTAT) 0.4 MG SL tablet Place 0.4 mg under the tongue every 5 (five) minutes as needed for chest pain.    . polyethylene glycol (MIRALAX / GLYCOLAX) packet Take 17 g by mouth daily.    . potassium chloride SA (K-DUR,KLOR-CON) 20 MEQ tablet Take 60 mEq by mouth daily.     . saxagliptin HCl (ONGLYZA) 5 MG TABS tablet Take 5 mg by mouth daily.    . vitamin C (ASCORBIC ACID) 500 MG tablet Take 1,000 mg by mouth daily.    Alveda Reasons 15 MG TABS tablet Take 15 mg by mouth daily.      No current facility-administered medications for this visit.      Allergies:   Adhesive [tape]   Social History:  The patient  reports that he has never smoked. He has never used smokeless tobacco. He reports that he does not drink alcohol or use drugs.   Family History:   family history includes Breast cancer in his maternal aunt; COPD in his mother; Colon cancer in his maternal aunt; Diabetes in his father, paternal grandmother, and sister; Heart disease in his brother; Ovarian cancer in his daughter.    Review of Systems: Review of Systems  Constitutional: Negative.   Respiratory: Negative.   Cardiovascular: Positive for leg swelling.  Gastrointestinal: Negative.   Musculoskeletal: Negative.        Gait instability  Neurological: Negative.   Psychiatric/Behavioral: Negative.   All other systems reviewed and are  negative.    PHYSICAL EXAM: VS:  BP (!) 149/80 (BP Location: Left Arm, Patient Position: Sitting, Cuff Size: Large)   Pulse 83   Ht 5\' 10"  (1.778 m)   Wt (!) 305 lb (138.3 kg)   BMI 43.76 kg/m  , BMI Body mass index is 43.76 kg/m. GEN: Well nourished, well developed, in no acute distress ,  obese  HEENT: normal  Neck: no JVD, carotid bruits, or masses Cardiac: Irregular rate and rhythm, no murmurs, rubs, or gallops, bilateral leg wraps in place , appears to have trace pitting edema lower extremities bilaterally mid shins down  Respiratory:  clear to auscultation bilaterally, normal work of breathing GI: soft, nontender, nondistended, + BS MS: no deformity or atrophy  Skin: warm and dry, no rash Neuro:  Strength and sensation are intact Psych: euthymic mood, full affect    Recent Labs: 10/27/2016: ALT 19; B Natriuretic Peptide 140.0 10/28/2016: BUN 19; Creatinine, Ser 1.34; Hemoglobin 15.5; Platelets 174; Potassium 3.5; Sodium 142    Lipid Panel Lab Results  Component Value Date   CHOL  08/12/2010    154        ATP III CLASSIFICATION:  <200     mg/dL   Desirable  200-239  mg/dL   Borderline High  >=240    mg/dL   High          HDL 53 08/12/2010   LDLCALC  08/12/2010    81        Total Cholesterol/HDL:CHD Risk Coronary Heart Disease Risk Table                     Men   Women  1/2 Average Risk   3.4   3.3  Average Risk       5.0   4.4  2 X Average Risk   9.6   7.1  3 X Average Risk  23.4   11.0        Use the calculated Patient Ratio above and the CHD Risk Table to determine the patient's CHD Risk.        ATP III CLASSIFICATION (LDL):  <100     mg/dL   Optimal  100-129  mg/dL   Near or Above                    Optimal  130-159  mg/dL   Borderline  160-189  mg/dL   High  >190     mg/dL   Very High   TRIG 101 08/12/2010      Wt Readings from Last 3 Encounters:  11/22/16 (!) 305 lb (138.3 kg)  10/28/16 (!) 301 lb 6.4 oz (136.7 kg)  09/05/16 (!) 302 lb 8 oz  (137.2 kg)       ASSESSMENT AND PLAN:  Chronic atrial fibrillation (HCC) - Plan: EKG 12-Lead Rate well controlled, tolerating anticoagulation  Diastolic dysfunction with chronic heart failure ([place]) - Plan: EKG 12-Lead Recommended he stay on Lasix 40 twice a day Suspect he is missing doses, also with high fluid intake Weight continues to trend upwards Stressed importance of decreasing his fluid intake, being compliant with his Lasix  Hypertensive heart disease with CHF (congestive heart failure) ([place]) - Plan: EKG 12-Lead Recommended he stop amlodipine. This has the potential to make his leg swelling worse Recommended he start clonidine 0.1 mg twice a day  Coronary artery disease involving native coronary artery of native heart with other form of angina pectoris [facility]) Recent hospital evaluation for chest pain, ruled out VQ scan negative Poor candidate for perfusion scan given his morbid obesity No further chest pain symptoms. Will monitor for now  Mixed hyperlipidemia Cholesterol is at goal on the current lipid regimen. No changes to the medications were made.  Secondary DM with CKD stage 4 and hypertension (HCC) Prior history of dietary noncompliance Recommended weight loss  Lymphedema Long time spent today discussing his therapy He is relatively noncompliant with his lymphedema compression pumps Needs family to help him put these on  Morbid obesity ([place]) We have encouraged continued exercise, careful diet management in an effort to lose weight.   Total encounter time more than 45 minutes  Greater than 50% was spent in counseling and coordination of care with the patient  Disposition:   F/U  6 months   Orders Placed This Encounter  Procedures  . EKG 12-Lead     Signed, Esmond Plants, M.D., Ph.D. 11/22/2016  [place], [place]

## 2016-11-21 DIAGNOSIS — L97211 Non-pressure chronic ulcer of right calf limited to breakdown of skin: Secondary | ICD-10-CM | POA: Diagnosis not present

## 2016-11-21 DIAGNOSIS — L97222 Non-pressure chronic ulcer of left calf with fat layer exposed: Secondary | ICD-10-CM | POA: Diagnosis not present

## 2016-11-21 DIAGNOSIS — E1122 Type 2 diabetes mellitus with diabetic chronic kidney disease: Secondary | ICD-10-CM | POA: Diagnosis not present

## 2016-11-21 DIAGNOSIS — L97822 Non-pressure chronic ulcer of other part of left lower leg with fat layer exposed: Secondary | ICD-10-CM | POA: Diagnosis not present

## 2016-11-21 DIAGNOSIS — I890 Lymphedema, not elsewhere classified: Secondary | ICD-10-CM | POA: Diagnosis not present

## 2016-11-21 DIAGNOSIS — I872 Venous insufficiency (chronic) (peripheral): Secondary | ICD-10-CM | POA: Diagnosis not present

## 2016-11-22 ENCOUNTER — Ambulatory Visit (INDEPENDENT_AMBULATORY_CARE_PROVIDER_SITE_OTHER): Payer: Medicare Other | Admitting: Cardiovascular Disease

## 2016-11-22 ENCOUNTER — Encounter: Payer: Self-pay | Admitting: Cardiovascular Disease

## 2016-11-22 VITALS — BP 149/80 | HR 83 | Ht 70.0 in | Wt 305.0 lb

## 2016-11-22 DIAGNOSIS — N184 Chronic kidney disease, stage 4 (severe): Secondary | ICD-10-CM

## 2016-11-22 DIAGNOSIS — I5032 Chronic diastolic (congestive) heart failure: Secondary | ICD-10-CM

## 2016-11-22 DIAGNOSIS — I890 Lymphedema, not elsewhere classified: Secondary | ICD-10-CM

## 2016-11-22 DIAGNOSIS — I209 Angina pectoris, unspecified: Secondary | ICD-10-CM

## 2016-11-22 DIAGNOSIS — I482 Chronic atrial fibrillation, unspecified: Secondary | ICD-10-CM

## 2016-11-22 DIAGNOSIS — E782 Mixed hyperlipidemia: Secondary | ICD-10-CM

## 2016-11-22 DIAGNOSIS — N183 Chronic kidney disease, stage 3 (moderate): Secondary | ICD-10-CM | POA: Diagnosis not present

## 2016-11-22 DIAGNOSIS — E1122 Type 2 diabetes mellitus with diabetic chronic kidney disease: Secondary | ICD-10-CM | POA: Diagnosis not present

## 2016-11-22 DIAGNOSIS — I10 Essential (primary) hypertension: Secondary | ICD-10-CM

## 2016-11-22 DIAGNOSIS — G4733 Obstructive sleep apnea (adult) (pediatric): Secondary | ICD-10-CM | POA: Diagnosis not present

## 2016-11-22 DIAGNOSIS — I25118 Atherosclerotic heart disease of native coronary artery with other forms of angina pectoris: Secondary | ICD-10-CM

## 2016-11-22 MED ORDER — CLONIDINE HCL 0.1 MG PO TABS: 0.1000 mg | ORAL_TABLET | Freq: Two times a day (BID) | ORAL | 6 refills | Status: DC

## 2016-11-22 NOTE — Patient Instructions (Addendum)
Medication Instructions:   Please stop the amlodipine  Start clonidine 0.1 mg twice a day  Continue lasix 40 BID Decrease your fluid intake (water)   Labwork:  No new labs needed  Testing/Procedures:  No further testing at this time   I recommend watching educational videos on topics of interest to you at:       [email or web address]  Enter code: HEARTCARE    Follow-Up: It was a pleasure seeing you in the office today. Please call us if you have new issues that need to be addressed before your next appt.  [phone]  Your physician wants you to follow-up in: 6 months.  You will receive a reminder letter in the mail two months in advance. If you don't receive a letter, please call our office to schedule the follow-up appointment.  If you need a refill on your cardiac medications before your next appointment, please call your pharmacy.     Edema Edema is when you have too much fluid in your body or under your skin. Edema may make your legs, feet, and ankles swell up. Swelling is also common in looser tissues, like around your eyes. This is a common condition. It gets more common as you get older. There are many possible causes of edema. Eating too much salt (sodium) and being on your feet or sitting for a long time can cause edema in your legs, feet, and ankles. Hot weather may make edema worse. Edema is usually painless. Your skin may look swollen or shiny. Follow these instructions at home:  Keep the swollen body part raised (elevated) above the level of your heart when you are sitting or lying down.  Do not sit still or stand for a long time.  Do not wear tight clothes. Do not wear garters on your upper legs.  Exercise your legs. This can help the swelling go down.  Wear elastic bandages or support stockings as told by your doctor.  Eat a low-salt (low-sodium) diet to reduce fluid as told by your doctor.  Depending on the cause of your swelling, you may need to  limit how much fluid you drink (fluid restriction).  Take over-the-counter and prescription medicines only as told by your doctor. Contact a doctor if:  Treatment is not working.  You have heart, liver, or kidney disease and have symptoms of edema.  You have sudden and unexplained weight gain. Get help right away if:  You have shortness of breath or chest pain.  You cannot breathe when you lie down.  You have pain, redness, or warmth in the swollen areas.  You have heart, liver, or kidney disease and get edema all of a sudden.  You have a fever and your symptoms get worse all of a sudden. Summary  Edema is when you have too much fluid in your body or under your skin.  Edema may make your legs, feet, and ankles swell up. Swelling is also common in looser tissues, like around your eyes.  Raise (elevate) the swollen body part above the level of your heart when you are sitting or lying down.  Follow your doctor's instructions about diet and how much fluid you can drink (fluid restriction). This information is not intended to replace advice given to you by your health care provider. Make sure you discuss any questions you have with your health care provider. Document Released: 12/14/2007 Document Revised: 07/15/2016 Document Reviewed: 07/15/2016 Elsevier Interactive Patient Education  2017 [employer].

## 2016-11-29 DIAGNOSIS — E1122 Type 2 diabetes mellitus with diabetic chronic kidney disease: Secondary | ICD-10-CM | POA: Diagnosis not present

## 2016-11-29 DIAGNOSIS — L97211 Non-pressure chronic ulcer of right calf limited to breakdown of skin: Secondary | ICD-10-CM | POA: Diagnosis not present

## 2016-11-29 DIAGNOSIS — L97222 Non-pressure chronic ulcer of left calf with fat layer exposed: Secondary | ICD-10-CM | POA: Diagnosis not present

## 2016-11-29 DIAGNOSIS — I890 Lymphedema, not elsewhere classified: Secondary | ICD-10-CM | POA: Diagnosis not present

## 2016-11-29 DIAGNOSIS — I872 Venous insufficiency (chronic) (peripheral): Secondary | ICD-10-CM | POA: Diagnosis not present

## 2016-11-29 DIAGNOSIS — L97822 Non-pressure chronic ulcer of other part of left lower leg with fat layer exposed: Secondary | ICD-10-CM | POA: Diagnosis not present

## 2016-12-06 DIAGNOSIS — I890 Lymphedema, not elsewhere classified: Secondary | ICD-10-CM | POA: Diagnosis not present

## 2016-12-06 DIAGNOSIS — L97222 Non-pressure chronic ulcer of left calf with fat layer exposed: Secondary | ICD-10-CM | POA: Diagnosis not present

## 2016-12-06 DIAGNOSIS — I872 Venous insufficiency (chronic) (peripheral): Secondary | ICD-10-CM | POA: Diagnosis not present

## 2016-12-06 DIAGNOSIS — L97822 Non-pressure chronic ulcer of other part of left lower leg with fat layer exposed: Secondary | ICD-10-CM | POA: Diagnosis not present

## 2016-12-06 DIAGNOSIS — E1122 Type 2 diabetes mellitus with diabetic chronic kidney disease: Secondary | ICD-10-CM | POA: Diagnosis not present

## 2016-12-06 DIAGNOSIS — L97211 Non-pressure chronic ulcer of right calf limited to breakdown of skin: Secondary | ICD-10-CM | POA: Diagnosis not present

## 2016-12-10 DIAGNOSIS — J449 Chronic obstructive pulmonary disease, unspecified: Secondary | ICD-10-CM | POA: Diagnosis not present

## 2016-12-10 DIAGNOSIS — Z7901 Long term (current) use of anticoagulants: Secondary | ICD-10-CM | POA: Diagnosis not present

## 2016-12-10 DIAGNOSIS — I130 Hypertensive heart and chronic kidney disease with heart failure and stage 1 through stage 4 chronic kidney disease, or unspecified chronic kidney disease: Secondary | ICD-10-CM | POA: Diagnosis not present

## 2016-12-10 DIAGNOSIS — N189 Chronic kidney disease, unspecified: Secondary | ICD-10-CM | POA: Diagnosis not present

## 2016-12-10 DIAGNOSIS — L97211 Non-pressure chronic ulcer of right calf limited to breakdown of skin: Secondary | ICD-10-CM | POA: Diagnosis not present

## 2016-12-10 DIAGNOSIS — E662 Morbid (severe) obesity with alveolar hypoventilation: Secondary | ICD-10-CM | POA: Diagnosis not present

## 2016-12-10 DIAGNOSIS — G4733 Obstructive sleep apnea (adult) (pediatric): Secondary | ICD-10-CM | POA: Diagnosis not present

## 2016-12-10 DIAGNOSIS — I890 Lymphedema, not elsewhere classified: Secondary | ICD-10-CM | POA: Diagnosis not present

## 2016-12-10 DIAGNOSIS — Z6841 Body Mass Index (BMI) 40.0 and over, adult: Secondary | ICD-10-CM | POA: Diagnosis not present

## 2016-12-10 DIAGNOSIS — I872 Venous insufficiency (chronic) (peripheral): Secondary | ICD-10-CM | POA: Diagnosis not present

## 2016-12-10 DIAGNOSIS — Z4800 Encounter for change or removal of nonsurgical wound dressing: Secondary | ICD-10-CM | POA: Diagnosis not present

## 2016-12-10 DIAGNOSIS — E1122 Type 2 diabetes mellitus with diabetic chronic kidney disease: Secondary | ICD-10-CM | POA: Diagnosis not present

## 2016-12-10 DIAGNOSIS — I4891 Unspecified atrial fibrillation: Secondary | ICD-10-CM | POA: Diagnosis not present

## 2016-12-10 DIAGNOSIS — L97221 Non-pressure chronic ulcer of left calf limited to breakdown of skin: Secondary | ICD-10-CM | POA: Diagnosis not present

## 2016-12-10 DIAGNOSIS — I509 Heart failure, unspecified: Secondary | ICD-10-CM | POA: Diagnosis not present

## 2016-12-10 DIAGNOSIS — Z7984 Long term (current) use of oral hypoglycemic drugs: Secondary | ICD-10-CM | POA: Diagnosis not present

## 2016-12-14 DIAGNOSIS — L97221 Non-pressure chronic ulcer of left calf limited to breakdown of skin: Secondary | ICD-10-CM | POA: Diagnosis not present

## 2016-12-14 DIAGNOSIS — L97211 Non-pressure chronic ulcer of right calf limited to breakdown of skin: Secondary | ICD-10-CM | POA: Diagnosis not present

## 2016-12-14 DIAGNOSIS — I890 Lymphedema, not elsewhere classified: Secondary | ICD-10-CM | POA: Diagnosis not present

## 2016-12-14 DIAGNOSIS — I130 Hypertensive heart and chronic kidney disease with heart failure and stage 1 through stage 4 chronic kidney disease, or unspecified chronic kidney disease: Secondary | ICD-10-CM | POA: Diagnosis not present

## 2016-12-14 DIAGNOSIS — E1122 Type 2 diabetes mellitus with diabetic chronic kidney disease: Secondary | ICD-10-CM | POA: Diagnosis not present

## 2016-12-14 DIAGNOSIS — I872 Venous insufficiency (chronic) (peripheral): Secondary | ICD-10-CM | POA: Diagnosis not present

## 2016-12-15 DIAGNOSIS — I872 Venous insufficiency (chronic) (peripheral): Secondary | ICD-10-CM | POA: Diagnosis not present

## 2016-12-15 DIAGNOSIS — I890 Lymphedema, not elsewhere classified: Secondary | ICD-10-CM | POA: Diagnosis not present

## 2016-12-15 DIAGNOSIS — I130 Hypertensive heart and chronic kidney disease with heart failure and stage 1 through stage 4 chronic kidney disease, or unspecified chronic kidney disease: Secondary | ICD-10-CM | POA: Diagnosis not present

## 2016-12-15 DIAGNOSIS — L97211 Non-pressure chronic ulcer of right calf limited to breakdown of skin: Secondary | ICD-10-CM | POA: Diagnosis not present

## 2016-12-15 DIAGNOSIS — L97221 Non-pressure chronic ulcer of left calf limited to breakdown of skin: Secondary | ICD-10-CM | POA: Diagnosis not present

## 2016-12-15 DIAGNOSIS — E1122 Type 2 diabetes mellitus with diabetic chronic kidney disease: Secondary | ICD-10-CM | POA: Diagnosis not present

## 2016-12-20 DIAGNOSIS — E1122 Type 2 diabetes mellitus with diabetic chronic kidney disease: Secondary | ICD-10-CM | POA: Diagnosis not present

## 2016-12-20 DIAGNOSIS — I872 Venous insufficiency (chronic) (peripheral): Secondary | ICD-10-CM | POA: Diagnosis not present

## 2016-12-20 DIAGNOSIS — L97211 Non-pressure chronic ulcer of right calf limited to breakdown of skin: Secondary | ICD-10-CM | POA: Diagnosis not present

## 2016-12-20 DIAGNOSIS — L97221 Non-pressure chronic ulcer of left calf limited to breakdown of skin: Secondary | ICD-10-CM | POA: Diagnosis not present

## 2016-12-20 DIAGNOSIS — I890 Lymphedema, not elsewhere classified: Secondary | ICD-10-CM | POA: Diagnosis not present

## 2016-12-20 DIAGNOSIS — I130 Hypertensive heart and chronic kidney disease with heart failure and stage 1 through stage 4 chronic kidney disease, or unspecified chronic kidney disease: Secondary | ICD-10-CM | POA: Diagnosis not present

## 2016-12-27 DIAGNOSIS — I890 Lymphedema, not elsewhere classified: Secondary | ICD-10-CM | POA: Diagnosis not present

## 2016-12-27 DIAGNOSIS — I130 Hypertensive heart and chronic kidney disease with heart failure and stage 1 through stage 4 chronic kidney disease, or unspecified chronic kidney disease: Secondary | ICD-10-CM | POA: Diagnosis not present

## 2016-12-27 DIAGNOSIS — L97221 Non-pressure chronic ulcer of left calf limited to breakdown of skin: Secondary | ICD-10-CM | POA: Diagnosis not present

## 2016-12-27 DIAGNOSIS — I872 Venous insufficiency (chronic) (peripheral): Secondary | ICD-10-CM | POA: Diagnosis not present

## 2016-12-27 DIAGNOSIS — E1122 Type 2 diabetes mellitus with diabetic chronic kidney disease: Secondary | ICD-10-CM | POA: Diagnosis not present

## 2016-12-27 DIAGNOSIS — L97211 Non-pressure chronic ulcer of right calf limited to breakdown of skin: Secondary | ICD-10-CM | POA: Diagnosis not present

## 2017-01-03 DIAGNOSIS — E1122 Type 2 diabetes mellitus with diabetic chronic kidney disease: Secondary | ICD-10-CM | POA: Diagnosis not present

## 2017-01-03 DIAGNOSIS — L97221 Non-pressure chronic ulcer of left calf limited to breakdown of skin: Secondary | ICD-10-CM | POA: Diagnosis not present

## 2017-01-03 DIAGNOSIS — I872 Venous insufficiency (chronic) (peripheral): Secondary | ICD-10-CM | POA: Diagnosis not present

## 2017-01-03 DIAGNOSIS — I890 Lymphedema, not elsewhere classified: Secondary | ICD-10-CM | POA: Diagnosis not present

## 2017-01-03 DIAGNOSIS — I130 Hypertensive heart and chronic kidney disease with heart failure and stage 1 through stage 4 chronic kidney disease, or unspecified chronic kidney disease: Secondary | ICD-10-CM | POA: Diagnosis not present

## 2017-01-03 DIAGNOSIS — L97211 Non-pressure chronic ulcer of right calf limited to breakdown of skin: Secondary | ICD-10-CM | POA: Diagnosis not present

## 2017-01-04 ENCOUNTER — Emergency Department: Payer: Medicare Other

## 2017-01-04 ENCOUNTER — Emergency Department
Admission: EM | Admit: 2017-01-04 | Discharge: 2017-01-04 | Disposition: A | Discharge status: Home / Self Care | Payer: Medicare Other | Attending: Emergency Medicine | Admitting: Emergency Medicine

## 2017-01-04 ENCOUNTER — Encounter: Payer: Self-pay | Admitting: Emergency Medicine

## 2017-01-04 DIAGNOSIS — I5032 Chronic diastolic (congestive) heart failure: Secondary | ICD-10-CM | POA: Diagnosis not present

## 2017-01-04 DIAGNOSIS — I2510 Atherosclerotic heart disease of native coronary artery without angina pectoris: Secondary | ICD-10-CM | POA: Insufficient documentation

## 2017-01-04 DIAGNOSIS — E119 Type 2 diabetes mellitus without complications: Secondary | ICD-10-CM | POA: Insufficient documentation

## 2017-01-04 DIAGNOSIS — I110 Hypertensive heart disease with heart failure: Secondary | ICD-10-CM | POA: Diagnosis not present

## 2017-01-04 DIAGNOSIS — Z7984 Long term (current) use of oral hypoglycemic drugs: Secondary | ICD-10-CM | POA: Insufficient documentation

## 2017-01-04 DIAGNOSIS — M542 Cervicalgia: Secondary | ICD-10-CM | POA: Insufficient documentation

## 2017-01-04 DIAGNOSIS — Z79899 Other long term (current) drug therapy: Secondary | ICD-10-CM | POA: Diagnosis not present

## 2017-01-04 DIAGNOSIS — I130 Hypertensive heart and chronic kidney disease with heart failure and stage 1 through stage 4 chronic kidney disease, or unspecified chronic kidney disease: Secondary | ICD-10-CM | POA: Insufficient documentation

## 2017-01-04 DIAGNOSIS — G4489 Other headache syndrome: Secondary | ICD-10-CM | POA: Diagnosis not present

## 2017-01-04 DIAGNOSIS — N184 Chronic kidney disease, stage 4 (severe): Secondary | ICD-10-CM | POA: Insufficient documentation

## 2017-01-04 DIAGNOSIS — J449 Chronic obstructive pulmonary disease, unspecified: Secondary | ICD-10-CM | POA: Diagnosis not present

## 2017-01-04 DIAGNOSIS — I6523 Occlusion and stenosis of bilateral carotid arteries: Secondary | ICD-10-CM | POA: Diagnosis not present

## 2017-01-04 LAB — COMPREHENSIVE METABOLIC PANEL
ALBUMIN: 3.6 g/dL (ref 3.5–5.0)
ALK PHOS: 81 U/L (ref 38–126)
ALT: 21 U/L (ref 17–63)
AST: 22 U/L (ref 15–41)
Anion gap: 6 (ref 5–15)
BILIRUBIN TOTAL: 1.2 mg/dL (ref 0.3–1.2)
BUN: 21 mg/dL — AB (ref 6–20)
CALCIUM: 9 mg/dL (ref 8.9–10.3)
CO2: 30 mmol/L (ref 22–32)
CREATININE: 1.27 mg/dL — AB (ref 0.61–1.24)
Chloride: 104 mmol/L (ref 101–111)
GFR calc Af Amer: >60 mL/min (ref >60)
GFR calc non Af Amer: 52 mL/min — ABNORMAL LOW (ref >60)
GLUCOSE: 204 mg/dL — AB (ref 65–99)
Potassium: 3.7 mmol/L (ref 3.5–5.1)
SODIUM: 140 mmol/L (ref 135–145)
TOTAL PROTEIN: 6.5 g/dL (ref 6.5–8.1)

## 2017-01-04 LAB — CBC
HEMATOCRIT: 47.1 % (ref 40.0–52.0)
HEMOGLOBIN: 15.8 g/dL (ref 13.0–18.0)
MCH: 28.3 pg (ref 26.0–34.0)
MCHC: 33.6 g/dL (ref 32.0–36.0)
MCV: 84.3 fL (ref 80.0–100.0)
Platelets: 160 10*3/uL (ref 150–440)
RBC: 5.59 MIL/uL (ref 4.40–5.90)
RDW: 16.3 % — ABNORMAL HIGH (ref 11.5–14.5)
WBC: 8.3 10*3/uL (ref 3.8–10.6)

## 2017-01-04 MED ORDER — HYDROCODONE-ACETAMINOPHEN 5-325 MG PO TABS: 1.0000 | ORAL_TABLET | Freq: Four times a day (QID) | ORAL | 0 refills | Status: DC | PRN

## 2017-01-04 MED ORDER — IOPAMIDOL (ISOVUE-370) INJECTION 76%
75.0000 mL | Freq: Once | INTRAVENOUS | Status: AC | PRN
  Administered 2017-01-04: 75 mL via INTRAVENOUS

## 2017-01-04 MED ORDER — ONDANSETRON HCL 4 MG/2ML IJ SOLN
4.0000 mg | Freq: Once | INTRAMUSCULAR | Status: AC
  Administered 2017-01-04: 4 mg via INTRAVENOUS
  Filled 2017-01-04: qty 2

## 2017-01-04 MED ORDER — MORPHINE SULFATE (PF) 2 MG/ML IV SOLN
2.0000 mg | Freq: Once | INTRAVENOUS | Status: AC
  Administered 2017-01-04: 2 mg via INTRAVENOUS
  Filled 2017-01-04: qty 1

## 2017-01-04 NOTE — ED Notes (Signed)
Patient transported to CT 

## 2017-01-04 NOTE — ED Notes (Signed)
Pt verbalized understanding of discharge instructions. NAD at this time. 

## 2017-01-04 NOTE — ED Provider Notes (Signed)
Asked to follow up on CT angiography of the head and neck by Dr. Owens Shark. Discussed results with the patient, several areas of chronic atherosclerosis. He is already anticoagulated. Patient's exam is most consistent with trapezius sprain with significant tenderness at the insertion site on the left. Patient will follow-up closely with his PCP in regards to CT findings of atherosclerosis and cervical stenosis, no neuro deficits. He knows to return if any worsening of his symptoms.   Lavonia Drafts, MD 01/04/17 [phone]

## 2017-01-04 NOTE — ED Provider Notes (Signed)
[facility] Emergency Department Provider Note    First MD Initiated Contact with Patient 01/04/17 [phone]     (approximate)  I have reviewed the triage vital signs and the nursing notes.   HISTORY  Chief Complaint Neck Injury    HPI Reginald [name] is a 82 y.o. male presents to the [facility] department 1 day history of nontraumatic left anterior/posterior neck pain radiating to the parietal occipital scalp. Patient states current pain score is 9 out of 10 worse with any movement. Patient denies any weakness numbness gait instability or visual changes. Patient denies any nausea or vomiting. Patient states that he awoke this way 1 day ago   Past Medical History:  Diagnosis Date  . Anteroseptal myocardial infarction ([place])   . Atrial fibrillation (HCC)    a. chronic, on Xarelto  . CHF (congestive heart failure) ([place])   . Chronic kidney disease   . COPD (chronic obstructive pulmonary disease) ([place])   . Coronary atherosclerosis of unspecified type of vessel, native or graft    a. s/p prior PCI to LAD in 1997  . Diverticulosis   . Glaucoma   . Hypercholesteremia   . Hypertensive renal disease   . Hypokalemia   . Obesity hypoventilation syndrome ([place])   . Obesity, unspecified   . OSA (obstructive sleep apnea)   . Rhabdomyolysis   . Type II or unspecified type diabetes mellitus without mention of complication, not stated as uncontrolled   . Unspecified essential hypertension   . Vestibulopathy     Patient Active Problem List   Diagnosis Date Noted  . Nonspecific chest pain 10/27/2016  . Morbid obesity ([place]) 05/02/2016  . Lymphedema 05/02/2016  . Abnormal laboratory test result 10/15/2015  . Cough 10/08/2015  . Hematuria 09/21/2015  . Hypokalemia 08/13/2015  . HTN (hypertension) 08/13/2015  . Multiple open wounds of lower leg 07/22/2015  . CKD (chronic kidney disease) stage 4, GFR 15-29 ml/min (HCC) 07/22/2015  . Cellulitis of leg, right 06/28/2015    . ARF (acute renal failure) ([place]) 06/28/2015  . Hypertensive heart disease with CHF (congestive heart failure) ([place]) 05/19/2015  . CAD (coronary artery disease), native coronary artery 05/19/2015  . Hyperlipidemia 05/19/2015  . Vitamin D deficiency 05/19/2015  . Pressure ulcer 05/16/2015  . Diastolic dysfunction with chronic heart failure ([place]) 05/08/2015  . Blisters of multiple sites 05/08/2015  . Type 2 diabetes mellitus ([place])   . Obesity, unspecified   . OSA (obstructive sleep apnea)   . Atrial fibrillation ([place])   . Secondary DM with CKD stage 4 and hypertension ([place])   . Glaucoma   . Cellulitis 05/07/2015    Past Surgical History:  Procedure Laterality Date  . APPENDECTOMY  1985  . CARDIAC CATHETERIZATION    . CHOLECYSTECTOMY  06/2000  . CORONARY ANGIOPLASTY  08/14/1995   stent placement to LAD   . INGUINAL HERNIA REPAIR Right 1985  . KNEE ARTHROSCOPY Left 1991  . LUMBAR FUSION    . NOSE SURGERY  1970s   Per Dr. Terrence Dupont [facility] in pt chart    Prior to Admission medications   Medication Sig Start Date End Date Taking? Authorizing Provider  atorvastatin (LIPITOR) 20 MG tablet Take 20 mg by mouth daily at 6 PM.     [provider]  cholecalciferol (VITAMIN D) 1000 UNITS tablet Take 1,000 Units by mouth daily.    [provider]  cloNIDine (CATAPRES) 0.1 MG tablet Take 1 tablet (0.1 mg total) by mouth  2 (two) times daily. 11/22/16   Minna Merritts, MD  collagenase (SANTYL) ointment Apply topically daily. 05/18/15   Charolette Forward, MD  escitalopram (LEXAPRO) 5 MG tablet Take 5 mg by mouth daily.    [provider]  furosemide (LASIX) 40 MG tablet Take 40 mg by mouth 2 (two) times daily. 40 mg in the morning and 40 mg at 2 pm.    [provider]  Lactobacillus (ACIDOPHILUS PROBIOTIC) 10 MG TABS Take 10 mg by mouth 3 (three) times daily. Patient taking differently: Take 10 mg by mouth at bedtime.  10/31/14   Waynetta Pean, PA-C  Magnesium 400  MG TABS Take 400 mg by mouth daily.    [provider]  metoprolol tartrate (LOPRESSOR) 25 MG tablet Take 1 tablet (25 mg total) by mouth 2 (two) times daily. 05/18/15   Charolette Forward, MD  nitroGLYCERIN (NITROSTAT) 0.4 MG SL tablet Place 0.4 mg under the tongue every 5 (five) minutes as needed for chest pain.    [provider]  polyethylene glycol (MIRALAX / GLYCOLAX) packet Take 17 g by mouth daily.    [provider]  potassium chloride SA (K-DUR,KLOR-CON) 20 MEQ tablet Take 60 mEq by mouth daily.     [provider]  saxagliptin HCl (ONGLYZA) 5 MG TABS tablet Take 5 mg by mouth daily.    [provider]  vitamin C (ASCORBIC ACID) 500 MG tablet Take 1,000 mg by mouth daily.    [provider]  XARELTO 15 MG TABS tablet Take 15 mg by mouth daily.  06/17/14   [provider]    Allergies Adhesive [tape]  Family History  Problem Relation Age of Onset  . COPD Mother   . Diabetes Father   . Diabetes Sister   . Heart disease Brother   . Breast cancer Maternal Aunt   . Diabetes Paternal Grandmother   . Colon cancer Maternal Aunt   . Ovarian cancer Daughter     Social History Social History  Substance Use Topics  . Smoking status: Never Smoker  . Smokeless tobacco: Never Used  . Alcohol use No    Review of Systems Constitutional: No fever/chills Eyes: No visual changes. ENT: No sore throat. Cardiovascular: Denies chest pain. Respiratory: Denies shortness of breath. Gastrointestinal: No abdominal pain.  No nausea, no vomiting.  No diarrhea.  No constipation. Genitourinary: Negative for dysuria. Musculoskeletal: Positive for neck pain.  Negative for back pain. Integumentary: Negative for rash. Neurological: Negative for headaches, focal weakness or numbness.   ____________________________________________   PHYSICAL EXAM:  VITAL SIGNS: ED Triage Vitals  Enc Vitals Group     BP 01/04/17 0544 (!) 166/87      Pulse Rate 01/04/17 0544 67     Resp 01/04/17 0544 20     Temp 01/04/17 0544 98.2 F (36.8 C)     Temp Source 01/04/17 0544 Oral     SpO2 01/04/17 0544 94 %     Weight 01/04/17 0544 134.7 kg (297 lb)     Height 01/04/17 0544 1.778 m (5\' 10" )     Head Circumference --      Peak Flow --      Pain Score 01/04/17 0543 7     Pain Loc --      Pain Edu? --      Excl. in [place]? --     Constitutional: Alert and oriented. Well appearing and in no acute distress. Eyes: Conjunctivae are normal. PERRL. EOMI. Head:  Atraumatic. Nose: No congestion/rhinnorhea. Mouth/Throat: Mucous membranes are moist. Oropharynx non-erythematous. Neck: No stridor.  No meningeal signs.  No cervical spine tenderness to palpation. Cardiovascular: Normal rate, regular rhythm. Good peripheral circulation. Grossly normal heart sounds. Respiratory: Normal respiratory effort.  No retractions. Lungs CTAB. Gastrointestinal: Soft and nontender. No distention.  Musculoskeletal: No lower extremity tenderness nor edema. No gross deformities of extremities. Neurologic:  Normal speech and language. No gross focal neurologic deficits are appreciated.  Skin:  Skin is warm, dry and intact. No rash noted. Psychiatric: Mood and affect are normal. Speech and behavior are normal.  ____________________________________________   LABS (all labs ordered are listed, but only abnormal results are displayed)  Labs Reviewed  CBC  COMPREHENSIVE METABOLIC PANEL     Procedures   ____________________________________________   INITIAL IMPRESSION / ASSESSMENT AND PLAN / ED COURSE  Pertinent labs & imaging results that were available during my care of the patient were reviewed by me and considered in my medical decision making (see chart for details).  Given history of physical exam concern for torticollis or vascular pathology. As such CT angiogram of the head and neck performed. Patient's care transferred to Dr. Corky Downs     ____________________________________________  FINAL CLINICAL IMPRESSION(S) / ED DIAGNOSES  Final diagnoses:  Neck pain     MEDICATIONS GIVEN DURING THIS VISIT:  Medications  morphine 2 MG/ML injection 2 mg (2 mg Intravenous Given 01/04/17 0627)  ondansetron (ZOFRAN) injection 4 mg (4 mg Intravenous Given 01/04/17 0627)     NEW OUTPATIENT MEDICATIONS STARTED DURING THIS VISIT:  New Prescriptions   No medications on file    Modified Medications   No medications on file    Discontinued Medications   No medications on file     Note:  This document was prepared using Dragon voice recognition software and may include unintentional dictation errors.    Gregor Hams, MD 01/04/17 [phone]

## 2017-01-04 NOTE — ED Triage Notes (Addendum)
Pt presents to ED via EMS with c/o left neck pain/radiates to head, that began yesterday morning, progressively worsen since. Pain aggravated when moving neck to left. Left jugular vein slightly dilated than the right.

## 2017-01-10 DIAGNOSIS — I130 Hypertensive heart and chronic kidney disease with heart failure and stage 1 through stage 4 chronic kidney disease, or unspecified chronic kidney disease: Secondary | ICD-10-CM | POA: Diagnosis not present

## 2017-01-10 DIAGNOSIS — E1122 Type 2 diabetes mellitus with diabetic chronic kidney disease: Secondary | ICD-10-CM | POA: Diagnosis not present

## 2017-01-10 DIAGNOSIS — I890 Lymphedema, not elsewhere classified: Secondary | ICD-10-CM | POA: Diagnosis not present

## 2017-01-10 DIAGNOSIS — L97211 Non-pressure chronic ulcer of right calf limited to breakdown of skin: Secondary | ICD-10-CM | POA: Diagnosis not present

## 2017-01-10 DIAGNOSIS — L97221 Non-pressure chronic ulcer of left calf limited to breakdown of skin: Secondary | ICD-10-CM | POA: Diagnosis not present

## 2017-01-10 DIAGNOSIS — I872 Venous insufficiency (chronic) (peripheral): Secondary | ICD-10-CM | POA: Diagnosis not present

## 2017-01-17 DIAGNOSIS — I890 Lymphedema, not elsewhere classified: Secondary | ICD-10-CM | POA: Diagnosis not present

## 2017-01-17 DIAGNOSIS — L97211 Non-pressure chronic ulcer of right calf limited to breakdown of skin: Secondary | ICD-10-CM | POA: Diagnosis not present

## 2017-01-17 DIAGNOSIS — L97221 Non-pressure chronic ulcer of left calf limited to breakdown of skin: Secondary | ICD-10-CM | POA: Diagnosis not present

## 2017-01-17 DIAGNOSIS — I130 Hypertensive heart and chronic kidney disease with heart failure and stage 1 through stage 4 chronic kidney disease, or unspecified chronic kidney disease: Secondary | ICD-10-CM | POA: Diagnosis not present

## 2017-01-17 DIAGNOSIS — I872 Venous insufficiency (chronic) (peripheral): Secondary | ICD-10-CM | POA: Diagnosis not present

## 2017-01-17 DIAGNOSIS — E1122 Type 2 diabetes mellitus with diabetic chronic kidney disease: Secondary | ICD-10-CM | POA: Diagnosis not present

## 2017-01-23 DIAGNOSIS — I110 Hypertensive heart disease with heart failure: Secondary | ICD-10-CM | POA: Diagnosis not present

## 2017-01-23 DIAGNOSIS — Z6841 Body Mass Index (BMI) 40.0 and over, adult: Secondary | ICD-10-CM | POA: Diagnosis not present

## 2017-01-23 DIAGNOSIS — E876 Hypokalemia: Secondary | ICD-10-CM | POA: Diagnosis not present

## 2017-01-23 DIAGNOSIS — I4891 Unspecified atrial fibrillation: Secondary | ICD-10-CM | POA: Diagnosis not present

## 2017-01-23 DIAGNOSIS — I890 Lymphedema, not elsewhere classified: Secondary | ICD-10-CM | POA: Diagnosis not present

## 2017-01-23 DIAGNOSIS — E785 Hyperlipidemia, unspecified: Secondary | ICD-10-CM | POA: Diagnosis not present

## 2017-01-23 DIAGNOSIS — F419 Anxiety disorder, unspecified: Secondary | ICD-10-CM | POA: Diagnosis not present

## 2017-01-23 DIAGNOSIS — E11622 Type 2 diabetes mellitus with other skin ulcer: Secondary | ICD-10-CM | POA: Diagnosis not present

## 2017-01-23 DIAGNOSIS — G250 Essential tremor: Secondary | ICD-10-CM | POA: Diagnosis not present

## 2017-01-24 DIAGNOSIS — I890 Lymphedema, not elsewhere classified: Secondary | ICD-10-CM | POA: Diagnosis not present

## 2017-01-24 DIAGNOSIS — I872 Venous insufficiency (chronic) (peripheral): Secondary | ICD-10-CM | POA: Diagnosis not present

## 2017-01-24 DIAGNOSIS — I130 Hypertensive heart and chronic kidney disease with heart failure and stage 1 through stage 4 chronic kidney disease, or unspecified chronic kidney disease: Secondary | ICD-10-CM | POA: Diagnosis not present

## 2017-01-24 DIAGNOSIS — L97221 Non-pressure chronic ulcer of left calf limited to breakdown of skin: Secondary | ICD-10-CM | POA: Diagnosis not present

## 2017-01-24 DIAGNOSIS — E1122 Type 2 diabetes mellitus with diabetic chronic kidney disease: Secondary | ICD-10-CM | POA: Diagnosis not present

## 2017-01-24 DIAGNOSIS — L97211 Non-pressure chronic ulcer of right calf limited to breakdown of skin: Secondary | ICD-10-CM | POA: Diagnosis not present

## 2017-01-31 DIAGNOSIS — E1122 Type 2 diabetes mellitus with diabetic chronic kidney disease: Secondary | ICD-10-CM | POA: Diagnosis not present

## 2017-01-31 DIAGNOSIS — L97221 Non-pressure chronic ulcer of left calf limited to breakdown of skin: Secondary | ICD-10-CM | POA: Diagnosis not present

## 2017-01-31 DIAGNOSIS — I872 Venous insufficiency (chronic) (peripheral): Secondary | ICD-10-CM | POA: Diagnosis not present

## 2017-01-31 DIAGNOSIS — L97211 Non-pressure chronic ulcer of right calf limited to breakdown of skin: Secondary | ICD-10-CM | POA: Diagnosis not present

## 2017-01-31 DIAGNOSIS — I890 Lymphedema, not elsewhere classified: Secondary | ICD-10-CM | POA: Diagnosis not present

## 2017-01-31 DIAGNOSIS — I130 Hypertensive heart and chronic kidney disease with heart failure and stage 1 through stage 4 chronic kidney disease, or unspecified chronic kidney disease: Secondary | ICD-10-CM | POA: Diagnosis not present

## 2017-02-07 DIAGNOSIS — I872 Venous insufficiency (chronic) (peripheral): Secondary | ICD-10-CM | POA: Diagnosis not present

## 2017-02-07 DIAGNOSIS — I130 Hypertensive heart and chronic kidney disease with heart failure and stage 1 through stage 4 chronic kidney disease, or unspecified chronic kidney disease: Secondary | ICD-10-CM | POA: Diagnosis not present

## 2017-02-07 DIAGNOSIS — I890 Lymphedema, not elsewhere classified: Secondary | ICD-10-CM | POA: Diagnosis not present

## 2017-02-07 DIAGNOSIS — L97221 Non-pressure chronic ulcer of left calf limited to breakdown of skin: Secondary | ICD-10-CM | POA: Diagnosis not present

## 2017-02-07 DIAGNOSIS — L97211 Non-pressure chronic ulcer of right calf limited to breakdown of skin: Secondary | ICD-10-CM | POA: Diagnosis not present

## 2017-02-07 DIAGNOSIS — E1122 Type 2 diabetes mellitus with diabetic chronic kidney disease: Secondary | ICD-10-CM | POA: Diagnosis not present

## 2017-05-01 DIAGNOSIS — I890 Lymphedema, not elsewhere classified: Secondary | ICD-10-CM | POA: Diagnosis not present

## 2017-05-01 DIAGNOSIS — Z6841 Body Mass Index (BMI) 40.0 and over, adult: Secondary | ICD-10-CM | POA: Diagnosis not present

## 2017-05-01 DIAGNOSIS — I4891 Unspecified atrial fibrillation: Secondary | ICD-10-CM | POA: Diagnosis not present

## 2017-05-01 DIAGNOSIS — R32 Unspecified urinary incontinence: Secondary | ICD-10-CM | POA: Diagnosis not present

## 2017-05-01 DIAGNOSIS — R4182 Altered mental status, unspecified: Secondary | ICD-10-CM | POA: Diagnosis not present

## 2017-05-01 DIAGNOSIS — I110 Hypertensive heart disease with heart failure: Secondary | ICD-10-CM | POA: Diagnosis not present

## 2017-05-01 DIAGNOSIS — Z23 Encounter for immunization: Secondary | ICD-10-CM | POA: Diagnosis not present

## 2017-05-01 DIAGNOSIS — E785 Hyperlipidemia, unspecified: Secondary | ICD-10-CM | POA: Diagnosis not present

## 2017-05-01 DIAGNOSIS — E11622 Type 2 diabetes mellitus with other skin ulcer: Secondary | ICD-10-CM | POA: Diagnosis not present

## 2017-05-04 DIAGNOSIS — R32 Unspecified urinary incontinence: Secondary | ICD-10-CM | POA: Diagnosis not present

## 2017-05-04 DIAGNOSIS — I679 Cerebrovascular disease, unspecified: Secondary | ICD-10-CM | POA: Diagnosis not present

## 2017-05-04 DIAGNOSIS — I739 Peripheral vascular disease, unspecified: Secondary | ICD-10-CM | POA: Diagnosis not present

## 2017-05-04 DIAGNOSIS — Z9181 History of falling: Secondary | ICD-10-CM | POA: Diagnosis not present

## 2017-05-04 DIAGNOSIS — L03119 Cellulitis of unspecified part of limb: Secondary | ICD-10-CM | POA: Diagnosis not present

## 2017-05-04 DIAGNOSIS — I4891 Unspecified atrial fibrillation: Secondary | ICD-10-CM | POA: Diagnosis not present

## 2017-05-04 DIAGNOSIS — G250 Essential tremor: Secondary | ICD-10-CM | POA: Diagnosis not present

## 2017-05-04 DIAGNOSIS — R269 Unspecified abnormalities of gait and mobility: Secondary | ICD-10-CM | POA: Diagnosis not present

## 2017-05-04 DIAGNOSIS — E785 Hyperlipidemia, unspecified: Secondary | ICD-10-CM | POA: Diagnosis not present

## 2017-05-04 DIAGNOSIS — H409 Unspecified glaucoma: Secondary | ICD-10-CM | POA: Diagnosis not present

## 2017-05-04 DIAGNOSIS — I890 Lymphedema, not elsewhere classified: Secondary | ICD-10-CM | POA: Diagnosis not present

## 2017-05-04 DIAGNOSIS — I5032 Chronic diastolic (congestive) heart failure: Secondary | ICD-10-CM | POA: Diagnosis not present

## 2017-05-04 DIAGNOSIS — Z95818 Presence of other cardiac implants and grafts: Secondary | ICD-10-CM | POA: Diagnosis not present

## 2017-05-04 DIAGNOSIS — I252 Old myocardial infarction: Secondary | ICD-10-CM | POA: Diagnosis not present

## 2017-05-04 DIAGNOSIS — E559 Vitamin D deficiency, unspecified: Secondary | ICD-10-CM | POA: Diagnosis not present

## 2017-05-04 DIAGNOSIS — E1121 Type 2 diabetes mellitus with diabetic nephropathy: Secondary | ICD-10-CM | POA: Diagnosis not present

## 2017-05-04 DIAGNOSIS — R600 Localized edema: Secondary | ICD-10-CM | POA: Diagnosis not present

## 2017-05-04 DIAGNOSIS — F419 Anxiety disorder, unspecified: Secondary | ICD-10-CM | POA: Diagnosis not present

## 2017-05-04 DIAGNOSIS — M545 Low back pain: Secondary | ICD-10-CM | POA: Diagnosis not present

## 2017-05-04 DIAGNOSIS — I2510 Atherosclerotic heart disease of native coronary artery without angina pectoris: Secondary | ICD-10-CM | POA: Diagnosis not present

## 2017-05-04 DIAGNOSIS — R4182 Altered mental status, unspecified: Secondary | ICD-10-CM | POA: Diagnosis not present

## 2017-05-04 DIAGNOSIS — I110 Hypertensive heart disease with heart failure: Secondary | ICD-10-CM | POA: Diagnosis not present

## 2017-05-04 DIAGNOSIS — N400 Enlarged prostate without lower urinary tract symptoms: Secondary | ICD-10-CM | POA: Diagnosis not present

## 2017-05-04 DIAGNOSIS — Z7901 Long term (current) use of anticoagulants: Secondary | ICD-10-CM | POA: Diagnosis not present

## 2017-05-11 DIAGNOSIS — I890 Lymphedema, not elsewhere classified: Secondary | ICD-10-CM | POA: Diagnosis not present

## 2017-05-11 DIAGNOSIS — I110 Hypertensive heart disease with heart failure: Secondary | ICD-10-CM | POA: Diagnosis not present

## 2017-05-11 DIAGNOSIS — L03119 Cellulitis of unspecified part of limb: Secondary | ICD-10-CM | POA: Diagnosis not present

## 2017-05-11 DIAGNOSIS — I739 Peripheral vascular disease, unspecified: Secondary | ICD-10-CM | POA: Diagnosis not present

## 2017-05-11 DIAGNOSIS — R4182 Altered mental status, unspecified: Secondary | ICD-10-CM | POA: Diagnosis not present

## 2017-05-11 DIAGNOSIS — E1121 Type 2 diabetes mellitus with diabetic nephropathy: Secondary | ICD-10-CM | POA: Diagnosis not present

## 2017-05-15 DIAGNOSIS — I739 Peripheral vascular disease, unspecified: Secondary | ICD-10-CM | POA: Diagnosis not present

## 2017-05-15 DIAGNOSIS — L03119 Cellulitis of unspecified part of limb: Secondary | ICD-10-CM | POA: Diagnosis not present

## 2017-05-15 DIAGNOSIS — R4182 Altered mental status, unspecified: Secondary | ICD-10-CM | POA: Diagnosis not present

## 2017-05-15 DIAGNOSIS — E1121 Type 2 diabetes mellitus with diabetic nephropathy: Secondary | ICD-10-CM | POA: Diagnosis not present

## 2017-05-15 DIAGNOSIS — I890 Lymphedema, not elsewhere classified: Secondary | ICD-10-CM | POA: Diagnosis not present

## 2017-05-15 DIAGNOSIS — I110 Hypertensive heart disease with heart failure: Secondary | ICD-10-CM | POA: Diagnosis not present

## 2017-05-18 DIAGNOSIS — I890 Lymphedema, not elsewhere classified: Secondary | ICD-10-CM | POA: Diagnosis not present

## 2017-05-18 DIAGNOSIS — E1121 Type 2 diabetes mellitus with diabetic nephropathy: Secondary | ICD-10-CM | POA: Diagnosis not present

## 2017-05-18 DIAGNOSIS — I739 Peripheral vascular disease, unspecified: Secondary | ICD-10-CM | POA: Diagnosis not present

## 2017-05-18 DIAGNOSIS — R4182 Altered mental status, unspecified: Secondary | ICD-10-CM | POA: Diagnosis not present

## 2017-05-18 DIAGNOSIS — L03119 Cellulitis of unspecified part of limb: Secondary | ICD-10-CM | POA: Diagnosis not present

## 2017-05-18 DIAGNOSIS — I110 Hypertensive heart disease with heart failure: Secondary | ICD-10-CM | POA: Diagnosis not present

## 2017-05-22 DIAGNOSIS — I110 Hypertensive heart disease with heart failure: Secondary | ICD-10-CM | POA: Diagnosis not present

## 2017-05-22 DIAGNOSIS — R4182 Altered mental status, unspecified: Secondary | ICD-10-CM | POA: Diagnosis not present

## 2017-05-22 DIAGNOSIS — I890 Lymphedema, not elsewhere classified: Secondary | ICD-10-CM | POA: Diagnosis not present

## 2017-05-22 DIAGNOSIS — L03119 Cellulitis of unspecified part of limb: Secondary | ICD-10-CM | POA: Diagnosis not present

## 2017-05-22 DIAGNOSIS — I739 Peripheral vascular disease, unspecified: Secondary | ICD-10-CM | POA: Diagnosis not present

## 2017-05-22 DIAGNOSIS — E1121 Type 2 diabetes mellitus with diabetic nephropathy: Secondary | ICD-10-CM | POA: Diagnosis not present

## 2017-05-25 DIAGNOSIS — I110 Hypertensive heart disease with heart failure: Secondary | ICD-10-CM | POA: Diagnosis not present

## 2017-05-25 DIAGNOSIS — R4182 Altered mental status, unspecified: Secondary | ICD-10-CM | POA: Diagnosis not present

## 2017-05-25 DIAGNOSIS — L03119 Cellulitis of unspecified part of limb: Secondary | ICD-10-CM | POA: Diagnosis not present

## 2017-05-25 DIAGNOSIS — I739 Peripheral vascular disease, unspecified: Secondary | ICD-10-CM | POA: Diagnosis not present

## 2017-05-25 DIAGNOSIS — E1121 Type 2 diabetes mellitus with diabetic nephropathy: Secondary | ICD-10-CM | POA: Diagnosis not present

## 2017-05-25 DIAGNOSIS — I890 Lymphedema, not elsewhere classified: Secondary | ICD-10-CM | POA: Diagnosis not present

## 2017-05-29 DIAGNOSIS — I890 Lymphedema, not elsewhere classified: Secondary | ICD-10-CM | POA: Diagnosis not present

## 2017-05-29 DIAGNOSIS — I739 Peripheral vascular disease, unspecified: Secondary | ICD-10-CM | POA: Diagnosis not present

## 2017-05-29 DIAGNOSIS — L03119 Cellulitis of unspecified part of limb: Secondary | ICD-10-CM | POA: Diagnosis not present

## 2017-05-29 DIAGNOSIS — I110 Hypertensive heart disease with heart failure: Secondary | ICD-10-CM | POA: Diagnosis not present

## 2017-05-29 DIAGNOSIS — E1121 Type 2 diabetes mellitus with diabetic nephropathy: Secondary | ICD-10-CM | POA: Diagnosis not present

## 2017-05-29 DIAGNOSIS — R4182 Altered mental status, unspecified: Secondary | ICD-10-CM | POA: Diagnosis not present

## 2017-05-31 DIAGNOSIS — R4182 Altered mental status, unspecified: Secondary | ICD-10-CM | POA: Diagnosis not present

## 2017-05-31 DIAGNOSIS — E1121 Type 2 diabetes mellitus with diabetic nephropathy: Secondary | ICD-10-CM | POA: Diagnosis not present

## 2017-05-31 DIAGNOSIS — L03119 Cellulitis of unspecified part of limb: Secondary | ICD-10-CM | POA: Diagnosis not present

## 2017-05-31 DIAGNOSIS — I110 Hypertensive heart disease with heart failure: Secondary | ICD-10-CM | POA: Diagnosis not present

## 2017-05-31 DIAGNOSIS — I739 Peripheral vascular disease, unspecified: Secondary | ICD-10-CM | POA: Diagnosis not present

## 2017-05-31 DIAGNOSIS — I890 Lymphedema, not elsewhere classified: Secondary | ICD-10-CM | POA: Diagnosis not present

## 2017-06-13 DIAGNOSIS — E113293 Type 2 diabetes mellitus with mild nonproliferative diabetic retinopathy without macular edema, bilateral: Secondary | ICD-10-CM | POA: Diagnosis not present

## 2017-06-28 DIAGNOSIS — Z0000 Encounter for general adult medical examination without abnormal findings: Secondary | ICD-10-CM | POA: Diagnosis not present

## 2017-06-28 DIAGNOSIS — D539 Nutritional anemia, unspecified: Secondary | ICD-10-CM | POA: Diagnosis not present

## 2017-06-28 DIAGNOSIS — E1165 Type 2 diabetes mellitus with hyperglycemia: Secondary | ICD-10-CM | POA: Diagnosis not present

## 2017-06-28 DIAGNOSIS — E559 Vitamin D deficiency, unspecified: Secondary | ICD-10-CM | POA: Diagnosis not present

## 2017-06-28 DIAGNOSIS — I10 Essential (primary) hypertension: Secondary | ICD-10-CM | POA: Diagnosis not present

## 2017-06-28 DIAGNOSIS — E7800 Pure hypercholesterolemia, unspecified: Secondary | ICD-10-CM | POA: Diagnosis not present

## 2017-06-28 DIAGNOSIS — E291 Testicular hypofunction: Secondary | ICD-10-CM | POA: Diagnosis not present

## 2017-06-28 DIAGNOSIS — R300 Dysuria: Secondary | ICD-10-CM | POA: Diagnosis not present

## 2017-06-28 DIAGNOSIS — M129 Arthropathy, unspecified: Secondary | ICD-10-CM | POA: Diagnosis not present

## 2017-06-28 DIAGNOSIS — R5383 Other fatigue: Secondary | ICD-10-CM | POA: Diagnosis not present

## 2017-06-30 ENCOUNTER — Other Ambulatory Visit: Payer: Self-pay

## 2017-06-30 DIAGNOSIS — I872 Venous insufficiency (chronic) (peripheral): Secondary | ICD-10-CM

## 2017-07-12 ENCOUNTER — Other Ambulatory Visit: Payer: Self-pay | Admitting: *Deleted

## 2017-07-12 MED ORDER — CLONIDINE HCL 0.1 MG PO TABS: 0.1000 mg | ORAL_TABLET | Freq: Two times a day (BID) | ORAL | 0 refills | Status: DC

## 2017-07-25 DIAGNOSIS — I10 Essential (primary) hypertension: Secondary | ICD-10-CM | POA: Diagnosis not present

## 2017-07-25 DIAGNOSIS — E1165 Type 2 diabetes mellitus with hyperglycemia: Secondary | ICD-10-CM | POA: Diagnosis not present

## 2017-07-25 DIAGNOSIS — Z9989 Dependence on other enabling machines and devices: Secondary | ICD-10-CM | POA: Diagnosis not present

## 2017-07-25 DIAGNOSIS — G4733 Obstructive sleep apnea (adult) (pediatric): Secondary | ICD-10-CM | POA: Diagnosis not present

## 2017-08-07 ENCOUNTER — Other Ambulatory Visit: Payer: Self-pay | Admitting: Cardiovascular Disease

## 2017-08-15 ENCOUNTER — Ambulatory Visit: Payer: Medicare Other | Admitting: Cardiovascular Disease

## 2017-08-22 DIAGNOSIS — N3946 Mixed incontinence: Secondary | ICD-10-CM | POA: Diagnosis not present

## 2017-08-25 ENCOUNTER — Encounter (HOSPITAL_COMMUNITY): Payer: Medicare Other

## 2017-08-25 ENCOUNTER — Encounter: Payer: Medicare Other | Admitting: Vascular Surgery

## 2017-09-11 ENCOUNTER — Encounter (HOSPITAL_COMMUNITY): Payer: Medicare Other

## 2017-09-11 ENCOUNTER — Other Ambulatory Visit: Payer: Self-pay

## 2017-09-11 ENCOUNTER — Encounter: Payer: Medicare Other | Admitting: Vascular Surgery

## 2017-09-11 DIAGNOSIS — I872 Venous insufficiency (chronic) (peripheral): Secondary | ICD-10-CM

## 2017-09-28 ENCOUNTER — Other Ambulatory Visit: Payer: Self-pay | Admitting: *Deleted

## 2017-09-28 MED ORDER — CLONIDINE HCL 0.1 MG PO TABS: ORAL_TABLET | ORAL | 0 refills | Status: DC

## 2017-10-04 ENCOUNTER — Ambulatory Visit (INDEPENDENT_AMBULATORY_CARE_PROVIDER_SITE_OTHER): Payer: Medicare Other | Admitting: Vascular Surgery

## 2017-10-04 ENCOUNTER — Ambulatory Visit (HOSPITAL_COMMUNITY)
Admission: RE | Admit: 2017-10-04 | Discharge: 2017-10-04 | Disposition: A | Discharge status: Home / Self Care | Payer: Medicare Other | Source: Ambulatory Visit | Attending: Vascular Surgery | Admitting: Vascular Surgery

## 2017-10-04 ENCOUNTER — Encounter: Payer: Self-pay | Admitting: Vascular Surgery

## 2017-10-04 ENCOUNTER — Other Ambulatory Visit: Payer: Self-pay

## 2017-10-04 DIAGNOSIS — I872 Venous insufficiency (chronic) (peripheral): Secondary | ICD-10-CM

## 2017-10-04 DIAGNOSIS — L97919 Non-pressure chronic ulcer of unspecified part of right lower leg with unspecified severity: Secondary | ICD-10-CM | POA: Diagnosis not present

## 2017-10-04 DIAGNOSIS — I83019 Varicose veins of right lower extremity with ulcer of unspecified site: Secondary | ICD-10-CM | POA: Diagnosis not present

## 2017-10-04 DIAGNOSIS — L97929 Non-pressure chronic ulcer of unspecified part of left lower leg with unspecified severity: Secondary | ICD-10-CM | POA: Diagnosis not present

## 2017-10-04 DIAGNOSIS — I83029 Varicose veins of left lower extremity with ulcer of unspecified site: Secondary | ICD-10-CM | POA: Diagnosis not present

## 2017-10-04 NOTE — Progress Notes (Signed)
Requested by:  Philmore Pali, NP [place], [place] [address]  Reason for consultation: bilateral leg swelling    History of Present Illness   Reginald [name] is a 82 y.o. (September 11, 1935) male who presents with chief complaint: bilateral leg swelling.  New wounds on left shin.  Patient notes, onset of swelling >10 years ago, associated with no obvious trigger.  The patient's symptoms include: bilateral leg swelling, development of venous ulcers, and recurrent cellulitis.  The patient has had no history of DVT, known history of varicose vein, known history of venous stasis ulcers, no history of  Lymphedema and known history of skin changes in lower legs.  There is no family history of venous disorders.  The patient has used therapeutic compression stockings in the past.  Past Medical History:  Diagnosis Date  . Anteroseptal myocardial infarction ([place])   . Atrial fibrillation (HCC)    a. chronic, on Xarelto  . CHF (congestive heart failure) ([place])   . Chronic kidney disease   . COPD (chronic obstructive pulmonary disease) ([place])   . Coronary atherosclerosis of unspecified type of vessel, native or graft    a. s/p prior PCI to LAD in 1997  . Diverticulosis   . Glaucoma   . Hypercholesteremia   . Hypertensive renal disease   . Hypokalemia   . Obesity hypoventilation syndrome ([place])   . Obesity, unspecified   . OSA (obstructive sleep apnea)   . Rhabdomyolysis   . Type II or unspecified type diabetes mellitus without mention of complication, not stated as uncontrolled   . Unspecified essential hypertension   . Vestibulopathy     Past Surgical History:  Procedure Laterality Date  . APPENDECTOMY  1985  . CARDIAC CATHETERIZATION    . CHOLECYSTECTOMY  06/2000  . CORONARY ANGIOPLASTY  08/14/1995   stent placement to LAD   . INGUINAL HERNIA REPAIR Right 1985  . KNEE ARTHROSCOPY Left 1991  . LUMBAR FUSION    . NOSE SURGERY  1970s   Per Dr. Terrence Dupont [facility] in pt chart    Social  History   Socioeconomic History  . Marital status: Single    Spouse name: Not on file  . Number of children: 5  . Years of education: Not on file  . Highest education level: Not on file  Occupational History  . Occupation: retired  Scientific laboratory technician  . Financial resource strain: Not on file  . Food insecurity:    Worry: Not on file    Inability: Not on file  . Transportation needs:    Medical: Not on file    Non-medical: Not on file  Tobacco Use  . Smoking status: Never Smoker  . Smokeless tobacco: Never Used  Substance and Sexual Activity  . Alcohol use: No    Alcohol/week: 0.0 oz  . Drug use: No  . Sexual activity: Never  Lifestyle  . Physical activity:    Days per week: Not on file    Minutes per session: Not on file  . Stress: Not on file  Relationships  . Social connections:    Talks on phone: Not on file    Gets together: Not on file    Attends religious service: Not on file    Active member of club or organization: Not on file    Attends meetings of clubs or organizations: Not on file    Relationship status: Not on file  . Intimate partner violence:    Fear of  current or ex partner: Not on file    Emotionally abused: Not on file    Physically abused: Not on file    Forced sexual activity: Not on file  Other Topics Concern  . Not on file  Social History Narrative  . Not on file    Family History  Problem Relation Age of Onset  . COPD Mother   . Diabetes Father   . Diabetes Sister   . Heart disease Brother   . Breast cancer Maternal Aunt   . Diabetes Paternal Grandmother   . Colon cancer Maternal Aunt   . Ovarian cancer Daughter     Current Outpatient Medications  Medication Sig Dispense Refill  . atorvastatin (LIPITOR) 20 MG tablet Take 20 mg by mouth daily at 6 PM.     . cholecalciferol (VITAMIN D) 1000 UNITS tablet Take 1,000 Units by mouth daily.    . cloNIDine (CATAPRES) 0.1 MG tablet TAKE ONE TABLET BY MOUTH TWICE DAILY 60 tablet 0  .  collagenase (SANTYL) ointment Apply topically daily. (Patient not taking: Reported on 01/04/2017) 15 g 0  . escitalopram (LEXAPRO) 5 MG tablet Take 5 mg by mouth daily.    . furosemide (LASIX) 40 MG tablet Take 40 mg by mouth 2 (two) times daily. 40 mg in the morning and 40 mg at 2 pm.    . HYDROcodone-acetaminophen (NORCO/VICODIN) 5-325 MG tablet Take 1 tablet by mouth every 6 (six) hours as needed for moderate pain. 20 tablet 0  . Lactobacillus (ACIDOPHILUS PROBIOTIC) 10 MG TABS Take 10 mg by mouth 3 (three) times daily. (Patient taking differently: Take 10 mg by mouth daily. ) 90 tablet 0  . latanoprost (XALATAN) 0.005 % ophthalmic solution Place 1 drop into both eyes at bedtime.    Marland Kitchen losartan (COZAAR) 50 MG tablet Take 50 mg by mouth daily.    . Magnesium 400 MG TABS Take 400 mg by mouth daily.    . Melatonin 5 MG TABS Take 5 mg by mouth at bedtime.    . metoprolol tartrate (LOPRESSOR) 25 MG tablet Take 1 tablet (25 mg total) by mouth 2 (two) times daily. 60 tablet 3  . nitroGLYCERIN (NITROSTAT) 0.4 MG SL tablet Place 0.4 mg under the tongue every 5 (five) minutes as needed for chest pain.    . polyethylene glycol (MIRALAX / GLYCOLAX) packet Take 17 g by mouth daily.    . potassium chloride SA (K-DUR,KLOR-CON) 20 MEQ tablet Take 60 mEq by mouth daily.     . saxagliptin HCl (ONGLYZA) 5 MG TABS tablet Take 5 mg by mouth daily.    . vitamin C (ASCORBIC ACID) 500 MG tablet Take 1,000 mg by mouth daily.    Alveda Reasons 15 MG TABS tablet Take 15 mg by mouth daily.      No current facility-administered medications for this visit.     Allergies  Allergen Reactions  . Adhesive [Tape] Rash    REVIEW OF SYSTEMS (negative unless checked):   Cardiac:  []  Chest pain or chest pressure? [x]  Shortness of breath upon activity? []  Shortness of breath when lying flat? [x]  Irregular heart rhythm?  Vascular:  []  Pain in calf, thigh, or hip brought on by walking? []  Pain in feet at night that wakes you  up from your sleep? []  Blood clot in your veins? [x]  Leg swelling?  Pulmonary:  []  Oxygen at home? []  Productive cough? []  Wheezing?  Neurologic:  []  Sudden weakness in arms or legs? []  Sudden  numbness in arms or legs? []  Sudden onset of difficult speaking or slurred speech? []  Temporary loss of vision in one eye? []  Problems with dizziness?  Gastrointestinal:  []  Blood in stool? []  Vomited blood?  Genitourinary:  []  Burning when urinating? []  Blood in urine?  Psychiatric:  []  Major depression  Hematologic:  []  Bleeding problems? []  Problems with blood clotting?  Dermatologic:  [x]  Rashes or ulcers?  Constitutional:  []  Fever or chills?  Ear/Nose/Throat:  []  Change in hearing? []  Nose bleeds? []  Sore throat?  Musculoskeletal:  []  Back pain? []  Joint pain? []  Muscle pain?   Physical Examination     Vitals:   10/04/17 1228  BP: 137/74  Pulse: 69  Resp: 20  Temp: (!) 97.1 F (36.2 C)  TempSrc: Oral  SpO2: 94%  Weight: (!) 310 lb 6.4 oz (140.8 kg)  Height: 5\' 10"  (1.778 m)   Body mass index is 44.54 kg/m.  General Alert, O x 3, Obese, NAD  Head [place]/AT,    Ear/Nose/ Throat Hearing grossly intact, nares without erythema or drainage, oropharynx without Erythema or Exudate, Mallampati score: 3,   Eyes PERRLA, EOMI,    Neck Supple, mid-line trachea,    Pulmonary Sym exp, good B air movt, CTA B  Cardiac RRR, Nl S1, S2, no Murmurs, No rubs, No S3,S4  Vascular Vessel Right Left  Radial Palpable Palpable  Brachial Palpable Palpable  Carotid Palpable, No Bruit Palpable, No Bruit  Aorta Not palpable N/A  Femoral Palpable Palpable  Popliteal Not palpable Not palpable  PT Not palpable Not palpable  DP Not palpable Not palpable    Gastro- intestinal soft, non-distended, non-tender to palpation, No guarding or rebound, no HSM, no masses, no CVAT B, No palpable prominent aortic pulse,    Musculo- skeletal M/S 5/5 throughout  , Extremities without  ischemic changes  , Pitting edema present: B, Varicosities present: faint B due to swelling, Lipodermatosclerosis present: B, shallow denuded ulcer in L shin, prior healed VSU evident, negative Karposi-Stemmer sign B, cap refill <2 sec B  Neurologic Cranial nerves 2-12 intact , Pain and light touch intact in extremities , Motor exam as listed above  Psychiatric Judgement intact, Mood & affect appropriate for pt's clinical situation  Dermatologic See M/S exam for extremity exam, No rashes otherwise noted  Lymphatic  Palpable lymph nodes: None    Non-invasive Vascular Imaging   BLE Venous Insufficiency Duplex (10/04/2017):  Venous Reflux Times Normal value < 0.5 sec  Right (ms) Left (ms)  CFV 821.50 1290.00  GSV at groin 1459.60 601.50  GSV at knee 1334.90   GSV prox calf 1819.00   Vein Diameters:  Right (cm) Left (cm)  GSV at SFJ .85  .93   GSV at prox thigh .72  .63   GSV at mid thigh .56  .59   GSV at distal thigh .61  .51   GSV at knee .61  .50   GSV prox calf .53  .53   GSV mid calf .45  .44   SSV .40  .40      Final Interpretation: Right: Abnormal reflux times were noted in the common femoral vein, great saphenous vein at the knee, and great saphenous vein at the prox calf. There is no evidence of obvious deep vein thrombosis in the lower extremity, limited visualization  Left: Abnormal reflux times were noted in the common femoral vein, and great saphenous vein at the groin. There is no evidence of obvious deep vein thrombosis  in the lower extremity, limited visualization.  Technically difficult exam due to body habitus and the inabiliaty to properly position patient    Outside Studies/Documentation   6 pages of outside documents were reviewed including: PCP chart.   Medical Decision Making   Reginald [name] is a 82 y.o. male who presents with: BLE chronic venous insufficiency (C6 Ep As,d Ps), left shin venous ulcer, prior healed venous ulcers   While his pulses are  difficult to palpate, cap refill is quick, so I suspect limited if any PAD.  Based on the patient's history and examination, I recommend: [place] for weekly compression wraps such as unna boot.  Patient is to resume his therapeutic compression once wounds are healed.  The patient will follow up in 3 months with my partners in the [facility] for evaluation for: R GSV EVLA followed by possibly L GSV EVLA to try to prevent recurrence of VSU due to CVI.  Thank you for allowing [place] to participate in this patient's care.   Adele Barthel, MD, FACS [facility] [place] Office: [phone] Pager: [phone]  10/04/2017, 12:22 PM

## 2017-10-10 ENCOUNTER — Ambulatory Visit: Payer: Medicare Other | Admitting: Cardiovascular Disease

## 2017-10-17 ENCOUNTER — Encounter (HOSPITAL_BASED_OUTPATIENT_CLINIC_OR_DEPARTMENT_OTHER): Payer: Medicare Other | Attending: Internal Medicine

## 2017-10-17 DIAGNOSIS — Z7984 Long term (current) use of oral hypoglycemic drugs: Secondary | ICD-10-CM | POA: Insufficient documentation

## 2017-10-17 DIAGNOSIS — J449 Chronic obstructive pulmonary disease, unspecified: Secondary | ICD-10-CM | POA: Diagnosis not present

## 2017-10-17 DIAGNOSIS — E1151 Type 2 diabetes mellitus with diabetic peripheral angiopathy without gangrene: Secondary | ICD-10-CM | POA: Diagnosis not present

## 2017-10-17 DIAGNOSIS — I252 Old myocardial infarction: Secondary | ICD-10-CM | POA: Insufficient documentation

## 2017-10-17 DIAGNOSIS — I2510 Atherosclerotic heart disease of native coronary artery without angina pectoris: Secondary | ICD-10-CM | POA: Insufficient documentation

## 2017-10-17 DIAGNOSIS — Z955 Presence of coronary angioplasty implant and graft: Secondary | ICD-10-CM | POA: Insufficient documentation

## 2017-10-17 DIAGNOSIS — I509 Heart failure, unspecified: Secondary | ICD-10-CM | POA: Diagnosis not present

## 2017-10-17 DIAGNOSIS — I110 Hypertensive heart disease with heart failure: Secondary | ICD-10-CM | POA: Insufficient documentation

## 2017-10-17 DIAGNOSIS — I872 Venous insufficiency (chronic) (peripheral): Secondary | ICD-10-CM | POA: Diagnosis not present

## 2017-10-17 DIAGNOSIS — Z9989 Dependence on other enabling machines and devices: Secondary | ICD-10-CM | POA: Insufficient documentation

## 2017-10-17 DIAGNOSIS — Z872 Personal history of diseases of the skin and subcutaneous tissue: Secondary | ICD-10-CM | POA: Insufficient documentation

## 2017-10-17 DIAGNOSIS — I890 Lymphedema, not elsewhere classified: Secondary | ICD-10-CM | POA: Diagnosis not present

## 2017-10-17 DIAGNOSIS — G4730 Sleep apnea, unspecified: Secondary | ICD-10-CM | POA: Diagnosis not present

## 2017-10-24 DIAGNOSIS — I2510 Atherosclerotic heart disease of native coronary artery without angina pectoris: Secondary | ICD-10-CM | POA: Diagnosis not present

## 2017-10-24 DIAGNOSIS — J449 Chronic obstructive pulmonary disease, unspecified: Secondary | ICD-10-CM | POA: Diagnosis not present

## 2017-10-24 DIAGNOSIS — I890 Lymphedema, not elsewhere classified: Secondary | ICD-10-CM | POA: Diagnosis not present

## 2017-10-24 DIAGNOSIS — L988 Other specified disorders of the skin and subcutaneous tissue: Secondary | ICD-10-CM | POA: Diagnosis not present

## 2017-10-24 DIAGNOSIS — Z872 Personal history of diseases of the skin and subcutaneous tissue: Secondary | ICD-10-CM | POA: Diagnosis not present

## 2017-10-24 DIAGNOSIS — I872 Venous insufficiency (chronic) (peripheral): Secondary | ICD-10-CM | POA: Diagnosis not present

## 2017-10-24 DIAGNOSIS — G4730 Sleep apnea, unspecified: Secondary | ICD-10-CM | POA: Diagnosis not present

## 2017-11-06 DIAGNOSIS — I6789 Other cerebrovascular disease: Secondary | ICD-10-CM | POA: Diagnosis not present

## 2017-11-06 DIAGNOSIS — Z7902 Long term (current) use of antithrombotics/antiplatelets: Secondary | ICD-10-CM | POA: Diagnosis not present

## 2017-11-06 DIAGNOSIS — Z79899 Other long term (current) drug therapy: Secondary | ICD-10-CM | POA: Diagnosis not present

## 2017-11-06 DIAGNOSIS — E1122 Type 2 diabetes mellitus with diabetic chronic kidney disease: Secondary | ICD-10-CM | POA: Diagnosis not present

## 2017-11-06 DIAGNOSIS — I4891 Unspecified atrial fibrillation: Secondary | ICD-10-CM | POA: Diagnosis not present

## 2017-11-06 DIAGNOSIS — I639 Cerebral infarction, unspecified: Secondary | ICD-10-CM | POA: Diagnosis not present

## 2017-11-06 DIAGNOSIS — N184 Chronic kidney disease, stage 4 (severe): Secondary | ICD-10-CM | POA: Diagnosis not present

## 2017-11-06 DIAGNOSIS — G92 Toxic encephalopathy: Secondary | ICD-10-CM | POA: Diagnosis not present

## 2017-11-06 DIAGNOSIS — G9340 Encephalopathy, unspecified: Secondary | ICD-10-CM | POA: Diagnosis not present

## 2017-11-06 DIAGNOSIS — R278 Other lack of coordination: Secondary | ICD-10-CM | POA: Diagnosis not present

## 2017-11-06 DIAGNOSIS — M6281 Muscle weakness (generalized): Secondary | ICD-10-CM | POA: Diagnosis not present

## 2017-11-06 DIAGNOSIS — M2550 Pain in unspecified joint: Secondary | ICD-10-CM | POA: Diagnosis not present

## 2017-11-06 DIAGNOSIS — Z7401 Bed confinement status: Secondary | ICD-10-CM | POA: Diagnosis not present

## 2017-11-06 DIAGNOSIS — I2510 Atherosclerotic heart disease of native coronary artery without angina pectoris: Secondary | ICD-10-CM | POA: Diagnosis not present

## 2017-11-06 DIAGNOSIS — I129 Hypertensive chronic kidney disease with stage 1 through stage 4 chronic kidney disease, or unspecified chronic kidney disease: Secondary | ICD-10-CM | POA: Diagnosis not present

## 2017-11-06 DIAGNOSIS — R319 Hematuria, unspecified: Secondary | ICD-10-CM | POA: Diagnosis not present

## 2017-11-06 DIAGNOSIS — R0902 Hypoxemia: Secondary | ICD-10-CM | POA: Diagnosis not present

## 2017-11-06 DIAGNOSIS — N3091 Cystitis, unspecified with hematuria: Secondary | ICD-10-CM | POA: Diagnosis present

## 2017-11-06 DIAGNOSIS — I482 Chronic atrial fibrillation: Secondary | ICD-10-CM | POA: Diagnosis present

## 2017-11-06 DIAGNOSIS — I6523 Occlusion and stenosis of bilateral carotid arteries: Secondary | ICD-10-CM | POA: Diagnosis not present

## 2017-11-06 DIAGNOSIS — I10 Essential (primary) hypertension: Secondary | ICD-10-CM | POA: Diagnosis not present

## 2017-11-06 DIAGNOSIS — R410 Disorientation, unspecified: Secondary | ICD-10-CM | POA: Diagnosis not present

## 2017-11-06 DIAGNOSIS — R41841 Cognitive communication deficit: Secondary | ICD-10-CM | POA: Diagnosis not present

## 2017-11-06 DIAGNOSIS — E1165 Type 2 diabetes mellitus with hyperglycemia: Secondary | ICD-10-CM | POA: Diagnosis not present

## 2017-11-06 DIAGNOSIS — R4182 Altered mental status, unspecified: Secondary | ICD-10-CM | POA: Diagnosis not present

## 2017-11-06 DIAGNOSIS — Z8673 Personal history of transient ischemic attack (TIA), and cerebral infarction without residual deficits: Secondary | ICD-10-CM | POA: Diagnosis not present

## 2017-11-06 DIAGNOSIS — R2689 Other abnormalities of gait and mobility: Secondary | ICD-10-CM | POA: Diagnosis not present

## 2017-11-06 DIAGNOSIS — I160 Hypertensive urgency: Secondary | ICD-10-CM | POA: Diagnosis not present

## 2017-11-06 DIAGNOSIS — E7800 Pure hypercholesterolemia, unspecified: Secondary | ICD-10-CM | POA: Diagnosis present

## 2017-11-06 DIAGNOSIS — G9341 Metabolic encephalopathy: Secondary | ICD-10-CM | POA: Diagnosis not present

## 2017-11-06 DIAGNOSIS — E1151 Type 2 diabetes mellitus with diabetic peripheral angiopathy without gangrene: Secondary | ICD-10-CM | POA: Diagnosis present

## 2017-11-10 DIAGNOSIS — Z8701 Personal history of pneumonia (recurrent): Secondary | ICD-10-CM | POA: Diagnosis not present

## 2017-11-10 DIAGNOSIS — L97919 Non-pressure chronic ulcer of unspecified part of right lower leg with unspecified severity: Secondary | ICD-10-CM | POA: Diagnosis not present

## 2017-11-10 DIAGNOSIS — I83029 Varicose veins of left lower extremity with ulcer of unspecified site: Secondary | ICD-10-CM | POA: Diagnosis not present

## 2017-11-10 DIAGNOSIS — N183 Chronic kidney disease, stage 3 (moderate): Secondary | ICD-10-CM | POA: Diagnosis not present

## 2017-11-10 DIAGNOSIS — I4891 Unspecified atrial fibrillation: Secondary | ICD-10-CM | POA: Diagnosis not present

## 2017-11-10 DIAGNOSIS — L03114 Cellulitis of left upper limb: Secondary | ICD-10-CM | POA: Diagnosis not present

## 2017-11-10 DIAGNOSIS — Z79899 Other long term (current) drug therapy: Secondary | ICD-10-CM | POA: Diagnosis not present

## 2017-11-10 DIAGNOSIS — Z7901 Long term (current) use of anticoagulants: Secondary | ICD-10-CM | POA: Diagnosis not present

## 2017-11-10 DIAGNOSIS — I878 Other specified disorders of veins: Secondary | ICD-10-CM | POA: Diagnosis not present

## 2017-11-10 DIAGNOSIS — I83811 Varicose veins of right lower extremities with pain: Secondary | ICD-10-CM | POA: Diagnosis not present

## 2017-11-10 DIAGNOSIS — Z7401 Bed confinement status: Secondary | ICD-10-CM | POA: Diagnosis not present

## 2017-11-10 DIAGNOSIS — I10 Essential (primary) hypertension: Secondary | ICD-10-CM | POA: Diagnosis not present

## 2017-11-10 DIAGNOSIS — D508 Other iron deficiency anemias: Secondary | ICD-10-CM | POA: Diagnosis not present

## 2017-11-10 DIAGNOSIS — Z9889 Other specified postprocedural states: Secondary | ICD-10-CM | POA: Diagnosis not present

## 2017-11-10 DIAGNOSIS — I480 Paroxysmal atrial fibrillation: Secondary | ICD-10-CM | POA: Diagnosis not present

## 2017-11-10 DIAGNOSIS — R410 Disorientation, unspecified: Secondary | ICD-10-CM | POA: Diagnosis not present

## 2017-11-10 DIAGNOSIS — Z872 Personal history of diseases of the skin and subcutaneous tissue: Secondary | ICD-10-CM | POA: Diagnosis not present

## 2017-11-10 DIAGNOSIS — R2689 Other abnormalities of gait and mobility: Secondary | ICD-10-CM | POA: Diagnosis not present

## 2017-11-10 DIAGNOSIS — R4189 Other symptoms and signs involving cognitive functions and awareness: Secondary | ICD-10-CM | POA: Diagnosis not present

## 2017-11-10 DIAGNOSIS — B029 Zoster without complications: Secondary | ICD-10-CM | POA: Diagnosis not present

## 2017-11-10 DIAGNOSIS — R600 Localized edema: Secondary | ICD-10-CM | POA: Diagnosis not present

## 2017-11-10 DIAGNOSIS — G9340 Encephalopathy, unspecified: Secondary | ICD-10-CM | POA: Diagnosis not present

## 2017-11-10 DIAGNOSIS — L539 Erythematous condition, unspecified: Secondary | ICD-10-CM | POA: Diagnosis not present

## 2017-11-10 DIAGNOSIS — L309 Dermatitis, unspecified: Secondary | ICD-10-CM | POA: Diagnosis not present

## 2017-11-10 DIAGNOSIS — L97929 Non-pressure chronic ulcer of unspecified part of left lower leg with unspecified severity: Secondary | ICD-10-CM | POA: Diagnosis not present

## 2017-11-10 DIAGNOSIS — G9341 Metabolic encephalopathy: Secondary | ICD-10-CM | POA: Diagnosis not present

## 2017-11-10 DIAGNOSIS — M6281 Muscle weakness (generalized): Secondary | ICD-10-CM | POA: Diagnosis not present

## 2017-11-10 DIAGNOSIS — N189 Chronic kidney disease, unspecified: Secondary | ICD-10-CM | POA: Diagnosis not present

## 2017-11-10 DIAGNOSIS — L0390 Cellulitis, unspecified: Secondary | ICD-10-CM | POA: Diagnosis not present

## 2017-11-10 DIAGNOSIS — N390 Urinary tract infection, site not specified: Secondary | ICD-10-CM | POA: Diagnosis not present

## 2017-11-10 DIAGNOSIS — E1165 Type 2 diabetes mellitus with hyperglycemia: Secondary | ICD-10-CM | POA: Diagnosis not present

## 2017-11-10 DIAGNOSIS — L299 Pruritus, unspecified: Secondary | ICD-10-CM | POA: Diagnosis not present

## 2017-11-10 DIAGNOSIS — I83019 Varicose veins of right lower extremity with ulcer of unspecified site: Secondary | ICD-10-CM | POA: Diagnosis not present

## 2017-11-10 DIAGNOSIS — R41841 Cognitive communication deficit: Secondary | ICD-10-CM | POA: Diagnosis not present

## 2017-11-10 DIAGNOSIS — R2681 Unsteadiness on feet: Secondary | ICD-10-CM | POA: Diagnosis not present

## 2017-11-10 DIAGNOSIS — M2550 Pain in unspecified joint: Secondary | ICD-10-CM | POA: Diagnosis not present

## 2017-11-10 DIAGNOSIS — I2510 Atherosclerotic heart disease of native coronary artery without angina pectoris: Secondary | ICD-10-CM | POA: Diagnosis not present

## 2017-11-10 DIAGNOSIS — E669 Obesity, unspecified: Secondary | ICD-10-CM | POA: Diagnosis not present

## 2017-11-10 DIAGNOSIS — N184 Chronic kidney disease, stage 4 (severe): Secondary | ICD-10-CM | POA: Diagnosis not present

## 2017-11-10 DIAGNOSIS — M792 Neuralgia and neuritis, unspecified: Secondary | ICD-10-CM | POA: Diagnosis not present

## 2017-11-10 DIAGNOSIS — I160 Hypertensive urgency: Secondary | ICD-10-CM | POA: Diagnosis not present

## 2017-11-10 DIAGNOSIS — R0789 Other chest pain: Secondary | ICD-10-CM | POA: Diagnosis not present

## 2017-11-10 DIAGNOSIS — I739 Peripheral vascular disease, unspecified: Secondary | ICD-10-CM | POA: Diagnosis not present

## 2017-11-10 DIAGNOSIS — I83813 Varicose veins of bilateral lower extremities with pain: Secondary | ICD-10-CM | POA: Diagnosis not present

## 2017-11-10 DIAGNOSIS — E139 Other specified diabetes mellitus without complications: Secondary | ICD-10-CM | POA: Diagnosis not present

## 2017-11-10 DIAGNOSIS — R278 Other lack of coordination: Secondary | ICD-10-CM | POA: Diagnosis not present

## 2017-11-13 DIAGNOSIS — E669 Obesity, unspecified: Secondary | ICD-10-CM | POA: Diagnosis not present

## 2017-11-13 DIAGNOSIS — I10 Essential (primary) hypertension: Secondary | ICD-10-CM | POA: Diagnosis not present

## 2017-11-13 DIAGNOSIS — N189 Chronic kidney disease, unspecified: Secondary | ICD-10-CM | POA: Diagnosis not present

## 2017-11-13 DIAGNOSIS — E139 Other specified diabetes mellitus without complications: Secondary | ICD-10-CM | POA: Diagnosis not present

## 2017-12-22 DIAGNOSIS — Z8701 Personal history of pneumonia (recurrent): Secondary | ICD-10-CM | POA: Diagnosis not present

## 2017-12-22 DIAGNOSIS — R0789 Other chest pain: Secondary | ICD-10-CM | POA: Diagnosis not present

## 2018-01-10 DIAGNOSIS — I10 Essential (primary) hypertension: Secondary | ICD-10-CM | POA: Diagnosis not present

## 2018-01-10 DIAGNOSIS — E139 Other specified diabetes mellitus without complications: Secondary | ICD-10-CM | POA: Diagnosis not present

## 2018-01-10 DIAGNOSIS — E669 Obesity, unspecified: Secondary | ICD-10-CM | POA: Diagnosis not present

## 2018-01-10 DIAGNOSIS — R4189 Other symptoms and signs involving cognitive functions and awareness: Secondary | ICD-10-CM | POA: Diagnosis not present

## 2018-01-16 ENCOUNTER — Ambulatory Visit: Payer: Medicare Other | Admitting: Vascular Surgery

## 2018-01-16 ENCOUNTER — Other Ambulatory Visit: Payer: Self-pay

## 2018-01-16 ENCOUNTER — Encounter: Payer: Self-pay | Admitting: Vascular Surgery

## 2018-01-16 ENCOUNTER — Ambulatory Visit (INDEPENDENT_AMBULATORY_CARE_PROVIDER_SITE_OTHER): Payer: Medicare Other | Admitting: Vascular Surgery

## 2018-01-16 VITALS — BP 134/70 | HR 83 | Temp 97.4°F | Resp 18 | Ht 70.0 in | Wt 295.0 lb

## 2018-01-16 DIAGNOSIS — I83813 Varicose veins of bilateral lower extremities with pain: Secondary | ICD-10-CM

## 2018-01-16 DIAGNOSIS — L97919 Non-pressure chronic ulcer of unspecified part of right lower leg with unspecified severity: Secondary | ICD-10-CM | POA: Diagnosis not present

## 2018-01-16 DIAGNOSIS — I83029 Varicose veins of left lower extremity with ulcer of unspecified site: Secondary | ICD-10-CM

## 2018-01-16 DIAGNOSIS — L97929 Non-pressure chronic ulcer of unspecified part of left lower leg with unspecified severity: Secondary | ICD-10-CM | POA: Diagnosis not present

## 2018-01-16 DIAGNOSIS — I83019 Varicose veins of right lower extremity with ulcer of unspecified site: Secondary | ICD-10-CM | POA: Diagnosis not present

## 2018-01-16 NOTE — Progress Notes (Signed)
Patient is a 82 year old male who returns for follow-up today.  We have intermittently seen him several times since 2017 for chronic exacerbation and remission of ulcerations on his legs.  These of all healed spontaneously in the past.  He did have a CT angiogram in the past which showed that he had some evidence of moderate superficial femoral and popliteal artery occlusive disease but he has always been able to heal these wounds with compression dressings.  He was seen by my partner Dr. Bridgett Larsson a few months ago for recurrent ulcerations.  He presents today for further evaluation.  He is currently being followed at the wound center and receiving compression dressings to try to get the ulcerations to heal on his legs.  He states these are slowly improving.  He is on Xarelto for atrial fibrillation.  Currently resides at a skilled nursing facility for deconditioning.  Past Medical History:  Diagnosis Date  . Anteroseptal myocardial infarction ([place])   . Atrial fibrillation (HCC)    a. chronic, on Xarelto  . CHF (congestive heart failure) ([place])   . Chronic kidney disease   . COPD (chronic obstructive pulmonary disease) ([place])   . Coronary atherosclerosis of unspecified type of vessel, native or graft    a. s/p prior PCI to LAD in 1997  . Diverticulosis   . Glaucoma   . Hypercholesteremia   . Hypertensive renal disease   . Hypokalemia   . Obesity hypoventilation syndrome ([place])   . Obesity, unspecified   . OSA (obstructive sleep apnea)   . Rhabdomyolysis   . Type II or unspecified type diabetes mellitus without mention of complication, not stated as uncontrolled   . Unspecified essential hypertension   . Vestibulopathy    Past Surgical History:  Procedure Laterality Date  . APPENDECTOMY  1985  . CARDIAC CATHETERIZATION    . CHOLECYSTECTOMY  06/2000  . CORONARY ANGIOPLASTY  08/14/1995   stent placement to LAD   . INGUINAL HERNIA REPAIR Right 1985  . KNEE ARTHROSCOPY Left 1991  . LUMBAR FUSION     . NOSE SURGERY  1970s   Per Dr. Terrence Dupont [facility] in pt chart    Current Outpatient Medications on File Prior to Visit  Medication Sig Dispense Refill  . atorvastatin (LIPITOR) 20 MG tablet Take 20 mg by mouth daily at 6 PM.     . cholecalciferol (VITAMIN D) 1000 UNITS tablet Take 1,000 Units by mouth daily.    . cloNIDine (CATAPRES) 0.1 MG tablet TAKE ONE TABLET BY MOUTH TWICE DAILY 60 tablet 0  . collagenase (SANTYL) ointment Apply topically daily. 15 g 0  . escitalopram (LEXAPRO) 5 MG tablet Take 5 mg by mouth daily.    . furosemide (LASIX) 40 MG tablet Take 40 mg by mouth 2 (two) times daily. 40 mg in the morning and 40 mg at 2 pm.    . Lactobacillus (ACIDOPHILUS PROBIOTIC) 10 MG TABS Take 10 mg by mouth 3 (three) times daily. (Patient taking differently: Take 10 mg by mouth daily. ) 90 tablet 0  . latanoprost (XALATAN) 0.005 % ophthalmic solution Place 1 drop into both eyes at bedtime.    Marland Kitchen losartan (COZAAR) 50 MG tablet Take 50 mg by mouth daily.    . Magnesium 400 MG TABS Take 400 mg by mouth daily.    . Melatonin 5 MG TABS Take 5 mg by mouth at bedtime.    . metoprolol tartrate (LOPRESSOR) 25 MG tablet Take 1 tablet (25 mg total) by mouth  2 (two) times daily. 60 tablet 3  . nitroGLYCERIN (NITROSTAT) 0.4 MG SL tablet Place 0.4 mg under the tongue every 5 (five) minutes as needed for chest pain.    . polyethylene glycol (MIRALAX / GLYCOLAX) packet Take 17 g by mouth daily.    . potassium chloride SA (K-DUR,KLOR-CON) 20 MEQ tablet Take 60 mEq by mouth daily.     . saxagliptin HCl (ONGLYZA) 5 MG TABS tablet Take 5 mg by mouth daily.    . vitamin C (ASCORBIC ACID) 500 MG tablet Take 1,000 mg by mouth daily.    Alveda Reasons 15 MG TABS tablet Take 15 mg by mouth daily.     Marland Kitchen HYDROcodone-acetaminophen (NORCO/VICODIN) 5-325 MG tablet Take 1 tablet by mouth every 6 (six) hours as needed for moderate pain. 20 tablet 0   No current facility-administered medications on file prior to visit.      Physical exam:  Vitals:   01/16/18 0830  BP: 134/70  Pulse: 83  Resp: 18  Temp: (!) 97.4 F (36.3 C)  TempSrc: Oral  SpO2: 91%  Weight: 295 lb (133.8 kg)  Height: 5\' 10"  (1.778 m)    Extremities: No palpable pedal pulses 2+ edema bilateral lower extremities        Assessment: Chronic venous skin changes with recurrent ulcerations and episodes of cellulitis.  The patient has been compliant with compression dressings and is currently being followed at the wound center.  I believe he would benefit from laser ablation of the right greater saphenous followed by the left greater saphenous in the near future.  This would prevent and reduce recurrent ulceration and cellulitis episodes.  CEAP 4.  Plan: Laser ablation right greater saphenous vein followed by interval these are ablation left greater saphenous vein to prevent recurrent episodes of skin breakdown and cellulitis.  Risk benefits possible complications and procedure details were explained to the patient today and he wishes to proceed.  We will work on getting this scheduled in the near future.  Ruta Hinds, MD [facility] [place] Office: [phone] Pager: [phone]

## 2018-01-17 ENCOUNTER — Telehealth: Payer: Self-pay | Admitting: Cardiovascular Disease

## 2018-01-17 ENCOUNTER — Other Ambulatory Visit: Payer: Self-pay | Admitting: *Deleted

## 2018-01-17 DIAGNOSIS — I83813 Varicose veins of bilateral lower extremities with pain: Secondary | ICD-10-CM

## 2018-01-17 NOTE — Telephone Encounter (Signed)
° °  [facility] Pre-operative Risk Assessment    Request for surgical clearance:  1. What type of surgery is being performed? endovenous laser ablation R. . . . (unable to read form)  2. When is this surgery scheduled? 01-23-18  3. What type of clearance is required (medical clearance vs. Pharmacy clearance to hold med vs. Both)? pharmacy  4. Are there any medications that need to be held prior to surgery and how long? xarelto 2 days prior to office sx, day of office surgery, 2 days following office surgery   5. Practice name and name of physician performing surgery? Ruta Hinds  [facility]   6. What is your office phone number [phone]   7.   What is your office fax number [phone]  8.   Anesthesia type (None, local, MAC, general) ? Unknown    Clarisse Gouge 01/17/2018, 11:29 AM  _________________________________________________________________   (provider comments below)

## 2018-01-17 NOTE — Telephone Encounter (Signed)
Patient scheduled for Endovenous laser ablation right greater saphenous vein.  He was last seen by Dr. Rockey Situ on 11/22/16 and he was due to come back in 6 months. No appointment made.   This is for pharmacy clearance only.   To Dr. Rockey Situ to review.

## 2018-01-18 ENCOUNTER — Ambulatory Visit: Payer: Medicare Other | Admitting: Vascular Surgery

## 2018-01-18 NOTE — Telephone Encounter (Signed)
Clearance routed to number provided.  

## 2018-01-18 NOTE — Telephone Encounter (Signed)
Request indicates he needs to be off his anticoagulation for 4 days Low risk of stroke coming off anticoagulation, but not insignificant If patient is willing to take this low risk,  No further testing would be needed and he could proceed

## 2018-01-18 NOTE — Telephone Encounter (Signed)
Dr. Oneida Alar office called and would like to get clearance before the end of today for procedure on the 16th. Spoke with Davy Pique in his office and she provided her direct number to call once that has been done. [phone]. Will review with provider and update when done.

## 2018-01-19 ENCOUNTER — Telehealth: Payer: Self-pay | Admitting: *Deleted

## 2018-01-19 NOTE — Telephone Encounter (Signed)
Confirmed with Kallie Edward (nurse at [facility]) that Mr. Dasch was scheduled for office surgery (endovenous laser ablation R greater saphenous vein) by Ruta Hinds on 01-23-2018 at [place] [address].  Diane Laneta Simmers is aware that Mr. Hussar needs to be at Dr. Nona Dell office 01-23-2018 at 8:15AM. [place] will arrange transportation services for 01-23-2018.   Confirmed with Kallie Edward that she had received via FAX yesterday pre-op instruction packet and medical clearance to stop Xarelto 2 days prior to procedure per Dr. Rockey Situ (Mr. [facility] cardiologist). Reviewed pre-op instructions with Kallie Edward and confirmed that Xarelto would be stopped 2 days prior to procedure.

## 2018-01-23 ENCOUNTER — Telehealth: Payer: Self-pay | Admitting: *Deleted

## 2018-01-23 ENCOUNTER — Other Ambulatory Visit: Payer: Self-pay

## 2018-01-23 ENCOUNTER — Ambulatory Visit (INDEPENDENT_AMBULATORY_CARE_PROVIDER_SITE_OTHER): Payer: Medicare Other | Admitting: Vascular Surgery

## 2018-01-23 ENCOUNTER — Encounter: Payer: Self-pay | Admitting: Vascular Surgery

## 2018-01-23 VITALS — BP 146/78 | HR 82 | Temp 97.2°F | Resp 20 | Ht 70.0 in | Wt 292.0 lb

## 2018-01-23 DIAGNOSIS — I83811 Varicose veins of right lower extremities with pain: Secondary | ICD-10-CM | POA: Diagnosis not present

## 2018-01-23 NOTE — Telephone Encounter (Signed)
Confirmed that Reginald [name] had received printed post procedure instructions that were sent to [facility] today following his endovenous laser ablation of right greater saphenous vein by Ruta Hinds MD.  Reviewed all post procedure instructions with Diane and reminded her of his venous duplex and [facility] follow up appointment with Dr. Donnetta Hutching scheduled for 02-01-2018. Reginald [name] will call [facility] if she has further questions.

## 2018-01-23 NOTE — Progress Notes (Signed)
Procedure: Laser ablation of right greater saphenous vein  Preoperative diagnosis: Reflux right greater saphenous vein with ulceration  Postoperative diagnosis: Same  Anesthesia: Tumescent local  Operative findings: Ablation of right greater saphenous vein from approximately 3 cm distal to the saphenofemoral junction to the knee level  Tumescent 600 cc  Continuous 15 W for a total of just under 3 minutes  Operative details: After obtaining informed consent, the patient was taken to the procedure room.  Ultrasound was used to identify the right greater saphenous vein and saphenofemoral junction.  The right leg was then prepped and draped in usual fashion.  Using ultrasound guidance the right greater saphenous vein was successfully cannulated adjacent to the knee joint.  Micropuncture needle was used for this and the micropuncture wire and sheath were placed into the right greater saphenous vein.  This was then exchanged for an 035 J-wire.  This was advanced up to the level of the saphenofemoral junction.  The 35 cm sheath was then placed over the J-wire and the laser fiber placed through this.  Care was taken to make sure that the laser fiber was not extending into the saphenofemoral junction and was approximately 3 cm from this.  Tumescent anesthesia was then infiltrated from the knee level all the way to the groin.  At this point we started the laser procedure the patient experienced some discomfort in the groin so some extra tumescent anesthesia was infiltrated.  He then tolerated the remainder of the pullback of the laser fiber.  Fiber and sheath were removed as a unit and hemostasis obtained with direct pressure.  Dressing was then applied.  Patient tolerated procedure well and will return for follow-up duplex exam in 1 week.  Ruta Hinds, MD [facility] [place] Office: [phone] Pager: [phone]

## 2018-01-23 NOTE — Progress Notes (Signed)
     Laser Ablation Procedure    Date: 01/23/2018   Reginald [name] DOB:Jun 21, 1936  Consent signed: Yes    Surgeon:  Dr. Ruta Hinds   Procedure: Laser Ablation: right Greater Saphenous Vein  BP (!) 146/78 (BP Location: Right Arm, Patient Position: Supine, Cuff Size: Large)   Pulse 82   Temp (!) 97.2 F (36.2 C) (Oral)   Resp 20   Ht 5\' 10"  (1.778 m)   Wt 292 lb (132.5 kg)   SpO2 92%   BMI 41.90 kg/m   Tumescent Anesthesia: 600 cc 0.9% NaCl with 50 cc Lidocaine HCL with 1% Epi and 15 cc 8.4% NaHCO3  Local Anesthesia: 1.5 cc Lidocaine HCL and NaHCO3 (ratio 2:1)  15 watts continuous mode        Total energy: machine was turned off before the number of joules was recorded.    Total time: 2.81   Patient tolerated procedure well  Notes: Last dose of Xarelto 01-20-2018  Description of Procedure:  After marking the course of the secondary varicosities, the patient was placed on the operating table in the supine position, and the right leg was prepped and draped in sterile fashion.   Local anesthetic was administered and under ultrasound guidance the saphenous vein was accessed with a micro needle and guide wire; then the mirco puncture sheath was placed.  A guide wire was inserted saphenofemoral junction , followed by a 5 french sheath.  The position of the sheath and then the laser fiber below the junction was confirmed using the ultrasound.  Tumescent anesthesia was administered along the course of the saphenous vein using ultrasound guidance. The patient was placed in Trendelenburg position and protective laser glasses were placed on patient and staff, and the laser was fired at 15 watts continuous mode advancing 1-65mm/second  machine was turned off before the number of joules was recorded.    Steri strip were applied to the IV insertion site and ABD pads and thigh high compression stockings were applied.  Ace wrap bandages were applied over the phlebectomy sites and at the  top of the saphenofemoral junction. Blood loss was less than 15 cc.  The patient ambulated out of the operating room having tolerated the procedure well.

## 2018-01-26 ENCOUNTER — Encounter: Payer: Self-pay | Admitting: Vascular Surgery

## 2018-02-01 ENCOUNTER — Ambulatory Visit (INDEPENDENT_AMBULATORY_CARE_PROVIDER_SITE_OTHER): Payer: Medicare Other | Admitting: Vascular Surgery

## 2018-02-01 ENCOUNTER — Ambulatory Visit (HOSPITAL_COMMUNITY)
Admission: RE | Admit: 2018-02-01 | Discharge: 2018-02-01 | Disposition: A | Discharge status: Home / Self Care | Payer: Medicare Other | Source: Ambulatory Visit | Attending: Vascular Surgery | Admitting: Vascular Surgery

## 2018-02-01 ENCOUNTER — Encounter: Payer: Self-pay | Admitting: Vascular Surgery

## 2018-02-01 ENCOUNTER — Other Ambulatory Visit: Payer: Self-pay

## 2018-02-01 VITALS — BP 115/63 | HR 79 | Temp 97.2°F | Resp 18 | Ht 70.0 in | Wt 292.0 lb

## 2018-02-01 DIAGNOSIS — I83813 Varicose veins of bilateral lower extremities with pain: Secondary | ICD-10-CM

## 2018-02-01 DIAGNOSIS — Z9889 Other specified postprocedural states: Secondary | ICD-10-CM | POA: Insufficient documentation

## 2018-02-01 NOTE — Progress Notes (Signed)
[facility] [place]  Patient name: Reginald [name] MRN: [id number] DOB: [birth year] Sex: male  REASON FOR VISIT: One week follow-up of right great saphenous vein ablation with Dr. Oneida Alar  HPI: Reginald [name] is a 82 y.o. male here today for follow-up.  Has been compliant with his compression garments.  Reports minimal discomfort associated with the ablation  Past Medical History:  Diagnosis Date  . Anteroseptal myocardial infarction ([place])   . Atrial fibrillation ([facility])    a. chronic, on Xarelto  . CHF (congestive heart failure) ([place])   . Chronic kidney disease   . COPD (chronic obstructive pulmonary disease) ([place])   . Coronary atherosclerosis of unspecified type of vessel, native or graft    a. s/p prior PCI to LAD in 1997  . Diverticulosis   . Glaucoma   . Hypercholesteremia   . Hypertensive renal disease   . Hypokalemia   . Obesity hypoventilation syndrome ([place])   . Obesity, unspecified   . OSA (obstructive sleep apnea)   . Rhabdomyolysis   . Type II or unspecified type diabetes mellitus without mention of complication, not stated as uncontrolled   . Unspecified essential hypertension   . Vestibulopathy     Family History  Problem Relation Age of Onset  . COPD Mother   . Heart disease Mother   . Diabetes Father   . Heart disease Father   . Diabetes Sister   . Heart disease Brother   . Heart disease Brother   . Breast cancer Maternal Aunt   . Diabetes Paternal Grandmother   . Colon cancer Maternal Aunt   . Ovarian cancer Daughter     SOCIAL HISTORY: Social History   Tobacco Use  . Smoking status: Never Smoker  . Smokeless tobacco: Never Used  Substance Use Topics  . Alcohol use: No    Alcohol/week: 0.0 oz    Allergies  Allergen Reactions  . Adhesive [Tape] Rash    Current Outpatient Medications  Medication Sig Dispense Refill  . atorvastatin (LIPITOR) 20 MG tablet Take 20 mg by mouth daily  at 6 PM.     . cholecalciferol (VITAMIN D) 1000 UNITS tablet Take 1,000 Units by mouth daily.    . cloNIDine (CATAPRES) 0.1 MG tablet TAKE ONE TABLET BY MOUTH TWICE DAILY 60 tablet 0  . collagenase (SANTYL) ointment Apply topically daily. 15 g 0  . escitalopram (LEXAPRO) 5 MG tablet Take 5 mg by mouth daily.    . furosemide (LASIX) 40 MG tablet Take 40 mg by mouth 2 (two) times daily. 40 mg in the morning and 40 mg at 2 pm.    . HYDROcodone-acetaminophen (NORCO/VICODIN) 5-325 MG tablet Take 1 tablet by mouth every 6 (six) hours as needed for moderate pain. 20 tablet 0  . Lactobacillus (ACIDOPHILUS PROBIOTIC) 10 MG TABS Take 10 mg by mouth 3 (three) times daily. (Patient taking differently: Take 10 mg by mouth daily. ) 90 tablet 0  . latanoprost (XALATAN) 0.005 % ophthalmic solution Place 1 drop into both eyes at bedtime.    Marland Kitchen losartan (COZAAR) 50 MG tablet Take 50 mg by mouth daily.    . Magnesium 400 MG TABS Take 400 mg by mouth daily.    . Melatonin 5 MG TABS Take 5 mg by mouth at bedtime.    . metoprolol tartrate (LOPRESSOR) 25 MG tablet Take 1 tablet (25 mg total) by mouth 2 (two) times daily. 60 tablet 3  . nitroGLYCERIN (NITROSTAT) 0.4  MG SL tablet Place 0.4 mg under the tongue every 5 (five) minutes as needed for chest pain.    . polyethylene glycol (MIRALAX / GLYCOLAX) packet Take 17 g by mouth daily.    . potassium chloride SA (K-DUR,KLOR-CON) 20 MEQ tablet Take 60 mEq by mouth daily.     . saxagliptin HCl (ONGLYZA) 5 MG TABS tablet Take 5 mg by mouth daily.    . vitamin C (ASCORBIC ACID) 500 MG tablet Take 1,000 mg by mouth daily.    Alveda Reasons 15 MG TABS tablet Take 15 mg by mouth daily.      No current facility-administered medications for this visit.     REVIEW OF SYSTEMS:  [X]  denotes positive finding, [ ]  denotes negative finding Cardiac  Comments:  Chest pain or chest pressure:    Shortness of breath upon exertion:    Short of breath when lying flat:    Irregular heart  rhythm: x       Vascular    Pain in calf, thigh, or hip brought on by ambulation:    Pain in feet at night that wakes you up from your sleep:     Blood clot in your veins:    Leg swelling:  x         PHYSICAL EXAM: Vitals:   02/01/18 1030  BP: 115/63  Pulse: 79  Resp: 18  Temp: (!) 97.2 F (36.2 C)  TempSrc: Oral  SpO2: 96%  Weight: 292 lb (132.5 kg)  Height: 5\' 10"  (1.778 m)    GENERAL: The patient is a well-nourished male, in no acute distress. The vital signs are documented above. CARDIOVASCULAR: Palpable radial pulses bilaterally PULMONARY: There is good air exchange  MUSCULOSKELETAL: There are no major deformities or cyanosis. NEUROLOGIC: No focal weakness or paresthesias are detected. SKIN: Hemosiderin deposits on his right calf and ankle.  No blistering with mild bruising at the ablation site PSYCHIATRIC: The patient has a normal affect.  DATA:  Successful closure of his great saphenous vein from the knee to approximately 2-1/2 cm below the saphenofemoral junction and no evidence of DVT  MEDICAL ISSUES: Successful ablation right great saphenous vein.  Will be scheduled for similar treatment in his left leg in the next several weeks.  Instructed him to continue use of his compression garment due to his chronic venous hypertension    Rosetta Posner, MD [facility] of [facility] Tel [phone] Pager [phone]

## 2018-02-05 ENCOUNTER — Other Ambulatory Visit: Payer: Self-pay | Admitting: *Deleted

## 2018-02-05 ENCOUNTER — Telehealth: Payer: Self-pay | Admitting: Cardiovascular Disease

## 2018-02-05 DIAGNOSIS — I83813 Varicose veins of bilateral lower extremities with pain: Secondary | ICD-10-CM

## 2018-02-05 NOTE — Telephone Encounter (Signed)
Patient last seen 11/22/16. On Xarelto for atrial fibrillation.  Looks like anticoagulation was also addressed on 01/17/18 by Dr. Rockey Situ.  Previously addressed for the Right greater saphenous vein. Now having a procedure on the Left greater saphenous vein.   To MD to review.

## 2018-02-05 NOTE — Telephone Encounter (Signed)
° °  [facility] Pre-operative Risk Assessment    Request for surgical clearance:  1. What type of surgery is being performed?  Endovenous laser ablation right greater saphenous vein.   2. When is this surgery scheduled? 02/21/18  4. What type of clearance is required (medical clearance vs. Pharmacy clearance to hold med vs. Both)? Pharmacy   3. Are there any medications that need to be held prior to surgery and how long?xarelto 2 days prior to office sx, day of office surgery, 2 days following office surgery  5. Practice name and name of physician performing surgery? Ruta Hinds  [facility]    4. What is your office phone number [phone]    7.   What is your office fax number [phone]  8.   Anesthesia type (None, local, MAC, general) ? Not listed

## 2018-02-06 NOTE — Telephone Encounter (Signed)
Spoke with Sonya over at Vascular and vein regarding clearance. Advised that it was provided on 01/17/18 and should still be good for repeat procedure on different leg. Reviewed that we did forward it to provider for review. She was appreciative for the follow up with no further questions.

## 2018-02-06 NOTE — Telephone Encounter (Signed)
Clearance from 01/17/18 resent to providers fax number listed on form and message left on voicemail to call back.

## 2018-02-14 DIAGNOSIS — Z7901 Long term (current) use of anticoagulants: Secondary | ICD-10-CM | POA: Diagnosis not present

## 2018-02-14 DIAGNOSIS — N189 Chronic kidney disease, unspecified: Secondary | ICD-10-CM | POA: Diagnosis not present

## 2018-02-14 DIAGNOSIS — I878 Other specified disorders of veins: Secondary | ICD-10-CM | POA: Diagnosis not present

## 2018-02-14 DIAGNOSIS — I10 Essential (primary) hypertension: Secondary | ICD-10-CM | POA: Diagnosis not present

## 2018-02-20 DIAGNOSIS — N189 Chronic kidney disease, unspecified: Secondary | ICD-10-CM | POA: Diagnosis not present

## 2018-02-20 DIAGNOSIS — I10 Essential (primary) hypertension: Secondary | ICD-10-CM | POA: Diagnosis not present

## 2018-02-21 ENCOUNTER — Other Ambulatory Visit: Payer: Self-pay

## 2018-02-21 ENCOUNTER — Encounter: Payer: Self-pay | Admitting: Vascular Surgery

## 2018-02-21 ENCOUNTER — Ambulatory Visit (INDEPENDENT_AMBULATORY_CARE_PROVIDER_SITE_OTHER): Payer: Medicare Other | Admitting: Vascular Surgery

## 2018-02-21 VITALS — BP 102/62 | HR 57 | Temp 97.0°F | Resp 18 | Ht 70.0 in | Wt 292.0 lb

## 2018-02-21 DIAGNOSIS — I83813 Varicose veins of bilateral lower extremities with pain: Secondary | ICD-10-CM | POA: Diagnosis not present

## 2018-02-21 HISTORY — PX: ENDOVENOUS ABLATION SAPHENOUS VEIN W/ LASER: SUR449

## 2018-02-21 NOTE — Progress Notes (Signed)
     Laser Ablation Procedure    Date: 02/21/2018   Reginald [name] DOB:September 01, 1935  Consent signed: Yes    Surgeon:  Dr. Ruta Hinds  Procedure: Laser Ablation: left Greater Saphenous Vein  BP 102/62 (BP Location: Left Arm, Patient Position: Sitting, Cuff Size: Large)   Pulse (!) 57   Temp (!) 97 F (36.1 C) (Oral)   Resp 18   Ht 5\' 10"  (1.778 m)   Wt 292 lb (132.5 kg)   SpO2 97%   BMI 41.90 kg/m   Tumescent Anesthesia: 780 cc 0.9% NaCl with 50 cc Lidocaine HCL 1% and 15 cc 8.4% NaHCO3  Local Anesthesia: 7 cc Lidocaine HCL and NaHCO3 (ratio 2:1)  15 watts continuous mode        Total energy: 2478 Joules   Total time: 2:44    Patient tolerated procedure well  Notes: Last dose of Xarelto 02-18-2018  Description of Procedure:  After marking the course of the secondary varicosities, the patient was placed on the operating table in the supine position, and the left leg was prepped and draped in sterile fashion.   Local anesthetic was administered and under ultrasound guidance the saphenous vein was accessed with a micro needle and guide wire; then the mirco puncture sheath was placed.  A guide wire was inserted saphenofemoral junction , followed by a 5 french sheath.  The position of the sheath and then the laser fiber below the junction was confirmed using the ultrasound.  Tumescent anesthesia was administered along the course of the saphenous vein using ultrasound guidance. The patient was placed in Trendelenburg position and protective laser glasses were placed on patient and staff, and the laser was fired at 15 watts continuous mode advancing 1-23mm/second for a total of 2478 joules.      Steri strip was applied to the  IV insertion site and ABD pads and thigh high compression stockings were applied.  Ace wrap bandages were applied over the phlebectomy sites and at the top of the saphenofemoral junction. Blood loss was less than 15 cc.  The patient ambulated out of the  operating room having tolerated the procedure well.  Ruta Hinds, MD [facility] Specialists of [place] Office: [phone] Pager: [phone]

## 2018-02-22 DIAGNOSIS — I10 Essential (primary) hypertension: Secondary | ICD-10-CM | POA: Diagnosis not present

## 2018-02-23 ENCOUNTER — Telehealth: Payer: Self-pay | Admitting: *Deleted

## 2018-02-23 ENCOUNTER — Encounter: Payer: Self-pay | Admitting: Vascular Surgery

## 2018-02-23 NOTE — Telephone Encounter (Signed)
Spoke with Chinwe Anyansi ([place]). She is to relay the following message to Kallie Edward RN who has already left for the day.  Mr. Doty may restart his Xarelto 02-24-2018 and his follow up appointments with Dr. Oneida Alar have been changed to 03-07-2018 at 3:30PM for venous duplex and 4:00PM for VV FU appointment with Dr. Oneida Alar.

## 2018-02-27 DIAGNOSIS — N189 Chronic kidney disease, unspecified: Secondary | ICD-10-CM | POA: Diagnosis not present

## 2018-02-27 DIAGNOSIS — I739 Peripheral vascular disease, unspecified: Secondary | ICD-10-CM | POA: Diagnosis not present

## 2018-03-05 DIAGNOSIS — B029 Zoster without complications: Secondary | ICD-10-CM | POA: Diagnosis not present

## 2018-03-05 DIAGNOSIS — I10 Essential (primary) hypertension: Secondary | ICD-10-CM | POA: Diagnosis not present

## 2018-03-05 DIAGNOSIS — N189 Chronic kidney disease, unspecified: Secondary | ICD-10-CM | POA: Diagnosis not present

## 2018-03-07 ENCOUNTER — Ambulatory Visit: Payer: Medicare Other | Admitting: Vascular Surgery

## 2018-03-07 ENCOUNTER — Other Ambulatory Visit: Payer: Self-pay

## 2018-03-07 ENCOUNTER — Encounter (HOSPITAL_COMMUNITY): Payer: Medicare Other

## 2018-03-07 ENCOUNTER — Ambulatory Visit (INDEPENDENT_AMBULATORY_CARE_PROVIDER_SITE_OTHER): Payer: Medicare Other | Admitting: Vascular Surgery

## 2018-03-07 ENCOUNTER — Encounter: Payer: Self-pay | Admitting: Vascular Surgery

## 2018-03-07 ENCOUNTER — Ambulatory Visit (HOSPITAL_COMMUNITY)
Admission: RE | Admit: 2018-03-07 | Discharge: 2018-03-07 | Disposition: A | Discharge status: Home / Self Care | Payer: Medicare Other | Source: Ambulatory Visit | Attending: Vascular Surgery | Admitting: Vascular Surgery

## 2018-03-07 VITALS — BP 146/74 | HR 83 | Temp 97.7°F | Resp 18 | Ht 70.0 in | Wt 290.0 lb

## 2018-03-07 DIAGNOSIS — B029 Zoster without complications: Secondary | ICD-10-CM | POA: Diagnosis not present

## 2018-03-07 DIAGNOSIS — I83001 Varicose veins of unspecified lower extremity with ulcer of thigh: Secondary | ICD-10-CM

## 2018-03-07 DIAGNOSIS — M792 Neuralgia and neuritis, unspecified: Secondary | ICD-10-CM | POA: Diagnosis not present

## 2018-03-07 DIAGNOSIS — L97101 Non-pressure chronic ulcer of unspecified thigh limited to breakdown of skin: Secondary | ICD-10-CM

## 2018-03-07 DIAGNOSIS — I83813 Varicose veins of bilateral lower extremities with pain: Secondary | ICD-10-CM | POA: Insufficient documentation

## 2018-03-07 NOTE — Progress Notes (Signed)
Patient is a 82 year old male who returns for follow-up today.  He previously had laser ablation of his left greater saphenous vein August 14.  Prior to that he had laser ablation of his right greater saphenous vein July 2019.  He reports no significant swelling in either leg.  He injured his left leg scraping it against a wheelchair and now has a new ulceration on the lateral aspect of his left leg.  He has also recently developed a bout of shingles involving his scalp and left eye.  He is currently on treatment for this.  Prior ABIs in 2017 were 0.9 bilaterally.  Not really complain of pain in the ulcer.  Physical exam:  Vitals:   03/07/18 1527 03/07/18 1535  BP: (!) 152/77 (!) 146/74  Pulse: 83 83  Resp: 18   Temp: 97.7 F (36.5 C)   TempSrc: Oral   SpO2: 93%   Weight: 290 lb (131.5 kg)   Height: 5\' 10"  (1.778 m)     Extremities: Left lower extremity 8 x 8 mm circular ulceration lateral aspect left leg just above the lateral malleolus.  Less than 1 mm depth.  2 mm pretibial ulcer less than 1 mm depth brawny staining extending from the mid calf all the way to the foot, right lower extremity no obvious ulcerations but edema is present  Data: Patient had a duplex ultrasound which confirmed the left greater saphenous vein was occluded 4 cm from the saphenofemoral junction.  Assessment: Successful bilateral venous laser closures of the greater saphenous vein in a patient with chronic recurring ulcerations both lower extremities.  He has a new ulcer in the left leg today.  Plan: We will place the patient in bilateral Unna boots today with weekly changes for 4 weeks and then he will see me back in follow-up.  If these fail to heal by the time who returns for follow-up in 1 month we will consider referring him back to the wound center.  Ruta Hinds, MD [facility] of [place] Office: [phone] Pager: [phone]

## 2018-03-07 NOTE — Progress Notes (Signed)
Vitals:   03/07/18 1527  BP: (!) 152/77  Pulse: 83  Resp: 18  Temp: 97.7 F (36.5 C)  TempSrc: Oral  SpO2: 93%  Weight: 290 lb (131.5 kg)  Height: 5\' 10"  (1.778 m)

## 2018-03-09 DIAGNOSIS — M792 Neuralgia and neuritis, unspecified: Secondary | ICD-10-CM | POA: Diagnosis not present

## 2018-03-09 DIAGNOSIS — N189 Chronic kidney disease, unspecified: Secondary | ICD-10-CM | POA: Diagnosis not present

## 2018-03-09 DIAGNOSIS — B029 Zoster without complications: Secondary | ICD-10-CM | POA: Diagnosis not present

## 2018-03-21 DIAGNOSIS — N189 Chronic kidney disease, unspecified: Secondary | ICD-10-CM | POA: Diagnosis not present

## 2018-03-21 DIAGNOSIS — I10 Essential (primary) hypertension: Secondary | ICD-10-CM | POA: Diagnosis not present

## 2018-03-26 DIAGNOSIS — L299 Pruritus, unspecified: Secondary | ICD-10-CM | POA: Diagnosis not present

## 2018-03-26 DIAGNOSIS — L03114 Cellulitis of left upper limb: Secondary | ICD-10-CM | POA: Diagnosis not present

## 2018-03-30 DIAGNOSIS — L03114 Cellulitis of left upper limb: Secondary | ICD-10-CM | POA: Diagnosis not present

## 2018-03-30 DIAGNOSIS — N189 Chronic kidney disease, unspecified: Secondary | ICD-10-CM | POA: Diagnosis not present

## 2018-04-03 DIAGNOSIS — L309 Dermatitis, unspecified: Secondary | ICD-10-CM | POA: Diagnosis not present

## 2018-04-03 DIAGNOSIS — L03114 Cellulitis of left upper limb: Secondary | ICD-10-CM | POA: Diagnosis not present

## 2018-04-04 DIAGNOSIS — L309 Dermatitis, unspecified: Secondary | ICD-10-CM | POA: Diagnosis not present

## 2018-04-04 DIAGNOSIS — L0390 Cellulitis, unspecified: Secondary | ICD-10-CM | POA: Diagnosis not present

## 2018-04-06 DIAGNOSIS — R600 Localized edema: Secondary | ICD-10-CM | POA: Diagnosis not present

## 2018-04-06 DIAGNOSIS — L0390 Cellulitis, unspecified: Secondary | ICD-10-CM | POA: Diagnosis not present

## 2018-04-06 DIAGNOSIS — Z872 Personal history of diseases of the skin and subcutaneous tissue: Secondary | ICD-10-CM | POA: Diagnosis not present

## 2018-04-10 DIAGNOSIS — I480 Paroxysmal atrial fibrillation: Secondary | ICD-10-CM | POA: Diagnosis not present

## 2018-04-10 DIAGNOSIS — N184 Chronic kidney disease, stage 4 (severe): Secondary | ICD-10-CM | POA: Diagnosis not present

## 2018-04-10 DIAGNOSIS — G9341 Metabolic encephalopathy: Secondary | ICD-10-CM | POA: Diagnosis not present

## 2018-04-10 DIAGNOSIS — R2681 Unsteadiness on feet: Secondary | ICD-10-CM | POA: Diagnosis not present

## 2018-04-10 DIAGNOSIS — M6281 Muscle weakness (generalized): Secondary | ICD-10-CM | POA: Diagnosis not present

## 2018-04-10 DIAGNOSIS — R2689 Other abnormalities of gait and mobility: Secondary | ICD-10-CM | POA: Diagnosis not present

## 2018-04-11 DIAGNOSIS — G9341 Metabolic encephalopathy: Secondary | ICD-10-CM | POA: Diagnosis not present

## 2018-04-11 DIAGNOSIS — I480 Paroxysmal atrial fibrillation: Secondary | ICD-10-CM | POA: Diagnosis not present

## 2018-04-11 DIAGNOSIS — R2681 Unsteadiness on feet: Secondary | ICD-10-CM | POA: Diagnosis not present

## 2018-04-11 DIAGNOSIS — M6281 Muscle weakness (generalized): Secondary | ICD-10-CM | POA: Diagnosis not present

## 2018-04-11 DIAGNOSIS — N184 Chronic kidney disease, stage 4 (severe): Secondary | ICD-10-CM | POA: Diagnosis not present

## 2018-04-11 DIAGNOSIS — D508 Other iron deficiency anemias: Secondary | ICD-10-CM | POA: Diagnosis not present

## 2018-04-11 DIAGNOSIS — R2689 Other abnormalities of gait and mobility: Secondary | ICD-10-CM | POA: Diagnosis not present

## 2018-04-12 DIAGNOSIS — G9341 Metabolic encephalopathy: Secondary | ICD-10-CM | POA: Diagnosis not present

## 2018-04-12 DIAGNOSIS — N184 Chronic kidney disease, stage 4 (severe): Secondary | ICD-10-CM | POA: Diagnosis not present

## 2018-04-12 DIAGNOSIS — R2681 Unsteadiness on feet: Secondary | ICD-10-CM | POA: Diagnosis not present

## 2018-04-12 DIAGNOSIS — L0390 Cellulitis, unspecified: Secondary | ICD-10-CM | POA: Diagnosis not present

## 2018-04-12 DIAGNOSIS — I480 Paroxysmal atrial fibrillation: Secondary | ICD-10-CM | POA: Diagnosis not present

## 2018-04-12 DIAGNOSIS — M6281 Muscle weakness (generalized): Secondary | ICD-10-CM | POA: Diagnosis not present

## 2018-04-12 DIAGNOSIS — R2689 Other abnormalities of gait and mobility: Secondary | ICD-10-CM | POA: Diagnosis not present

## 2018-04-13 DIAGNOSIS — R2681 Unsteadiness on feet: Secondary | ICD-10-CM | POA: Diagnosis not present

## 2018-04-13 DIAGNOSIS — N184 Chronic kidney disease, stage 4 (severe): Secondary | ICD-10-CM | POA: Diagnosis not present

## 2018-04-13 DIAGNOSIS — I480 Paroxysmal atrial fibrillation: Secondary | ICD-10-CM | POA: Diagnosis not present

## 2018-04-13 DIAGNOSIS — M6281 Muscle weakness (generalized): Secondary | ICD-10-CM | POA: Diagnosis not present

## 2018-04-13 DIAGNOSIS — R2689 Other abnormalities of gait and mobility: Secondary | ICD-10-CM | POA: Diagnosis not present

## 2018-04-13 DIAGNOSIS — G9341 Metabolic encephalopathy: Secondary | ICD-10-CM | POA: Diagnosis not present

## 2018-04-14 DIAGNOSIS — R2689 Other abnormalities of gait and mobility: Secondary | ICD-10-CM | POA: Diagnosis not present

## 2018-04-14 DIAGNOSIS — N184 Chronic kidney disease, stage 4 (severe): Secondary | ICD-10-CM | POA: Diagnosis not present

## 2018-04-14 DIAGNOSIS — R2681 Unsteadiness on feet: Secondary | ICD-10-CM | POA: Diagnosis not present

## 2018-04-14 DIAGNOSIS — G9341 Metabolic encephalopathy: Secondary | ICD-10-CM | POA: Diagnosis not present

## 2018-04-14 DIAGNOSIS — M6281 Muscle weakness (generalized): Secondary | ICD-10-CM | POA: Diagnosis not present

## 2018-04-14 DIAGNOSIS — I480 Paroxysmal atrial fibrillation: Secondary | ICD-10-CM | POA: Diagnosis not present

## 2018-04-17 DIAGNOSIS — I480 Paroxysmal atrial fibrillation: Secondary | ICD-10-CM | POA: Diagnosis not present

## 2018-04-17 DIAGNOSIS — R2689 Other abnormalities of gait and mobility: Secondary | ICD-10-CM | POA: Diagnosis not present

## 2018-04-17 DIAGNOSIS — N184 Chronic kidney disease, stage 4 (severe): Secondary | ICD-10-CM | POA: Diagnosis not present

## 2018-04-17 DIAGNOSIS — M6281 Muscle weakness (generalized): Secondary | ICD-10-CM | POA: Diagnosis not present

## 2018-04-17 DIAGNOSIS — R2681 Unsteadiness on feet: Secondary | ICD-10-CM | POA: Diagnosis not present

## 2018-04-17 DIAGNOSIS — G9341 Metabolic encephalopathy: Secondary | ICD-10-CM | POA: Diagnosis not present

## 2018-04-18 DIAGNOSIS — R2681 Unsteadiness on feet: Secondary | ICD-10-CM | POA: Diagnosis not present

## 2018-04-18 DIAGNOSIS — G9341 Metabolic encephalopathy: Secondary | ICD-10-CM | POA: Diagnosis not present

## 2018-04-18 DIAGNOSIS — I480 Paroxysmal atrial fibrillation: Secondary | ICD-10-CM | POA: Diagnosis not present

## 2018-04-18 DIAGNOSIS — M6281 Muscle weakness (generalized): Secondary | ICD-10-CM | POA: Diagnosis not present

## 2018-04-18 DIAGNOSIS — R2689 Other abnormalities of gait and mobility: Secondary | ICD-10-CM | POA: Diagnosis not present

## 2018-04-18 DIAGNOSIS — N184 Chronic kidney disease, stage 4 (severe): Secondary | ICD-10-CM | POA: Diagnosis not present

## 2018-04-19 ENCOUNTER — Encounter: Payer: Self-pay | Admitting: Vascular Surgery

## 2018-04-19 ENCOUNTER — Other Ambulatory Visit: Payer: Self-pay

## 2018-04-19 ENCOUNTER — Ambulatory Visit (INDEPENDENT_AMBULATORY_CARE_PROVIDER_SITE_OTHER): Payer: Medicare Other | Admitting: Vascular Surgery

## 2018-04-19 VITALS — BP 138/78 | HR 66 | Temp 97.8°F | Resp 18 | Ht 70.0 in | Wt 335.0 lb

## 2018-04-19 DIAGNOSIS — R2681 Unsteadiness on feet: Secondary | ICD-10-CM | POA: Diagnosis not present

## 2018-04-19 DIAGNOSIS — N184 Chronic kidney disease, stage 4 (severe): Secondary | ICD-10-CM | POA: Diagnosis not present

## 2018-04-19 DIAGNOSIS — R2689 Other abnormalities of gait and mobility: Secondary | ICD-10-CM | POA: Diagnosis not present

## 2018-04-19 DIAGNOSIS — G9341 Metabolic encephalopathy: Secondary | ICD-10-CM | POA: Diagnosis not present

## 2018-04-19 DIAGNOSIS — I83813 Varicose veins of bilateral lower extremities with pain: Secondary | ICD-10-CM | POA: Diagnosis not present

## 2018-04-19 DIAGNOSIS — M6281 Muscle weakness (generalized): Secondary | ICD-10-CM | POA: Diagnosis not present

## 2018-04-19 DIAGNOSIS — I480 Paroxysmal atrial fibrillation: Secondary | ICD-10-CM | POA: Diagnosis not present

## 2018-04-19 NOTE — Progress Notes (Signed)
Patient is an 82 year old male who returns for follow-up today.  He has chronic swelling in both legs.  He is previously undergone laser ablation of the left and right greater saphenous vein.  He was recently treated with compression therapy and Unna boot therapy for last few weeks.  He developed a skin reaction to the [employer] so these were discontinued and exchanged for a Coban dressing.  He denies any pain.  He states the legs are chronically swollen.  He reports his swelling did improve a little bit after laser ablation.  Physical exam:  Vitals:   04/19/18 0959  BP: 138/78  Pulse: 66  Resp: 18  Temp: 97.8 F (36.6 C)  TempSrc: Oral  SpO2: 96%  Weight: (!) 335 lb (152 kg)  Height: 5\' 10"  (1.778 m)    Extremities: 3+ edema bilateral lower extremities less than 1 mm depth 3 x 3 mm ulceration right pretibial region  Skin: Brawny discoloration circumferentially both legs  Assessment: Chronically swollen legs slight improvement with laser ablation of both saphenous veins.  Most likely at this point biggest complaint is deep vein reflux as well as venous hypertension.  Plan: The patient will continue Coban dressings as long as they are currently available to do these at his skilled nursing facility.  He will need long-term lifelong compression either with these type of dressings or compression stockings which ever are easier to be compliant with.  He will follow-up with [place] on an as-needed basis.  Ruta Hinds, MD [facility] Ve[facility] Specialists of [place] Office: [phone] Pager: [phone]

## 2018-04-20 DIAGNOSIS — R2689 Other abnormalities of gait and mobility: Secondary | ICD-10-CM | POA: Diagnosis not present

## 2018-04-20 DIAGNOSIS — G9341 Metabolic encephalopathy: Secondary | ICD-10-CM | POA: Diagnosis not present

## 2018-04-20 DIAGNOSIS — M6281 Muscle weakness (generalized): Secondary | ICD-10-CM | POA: Diagnosis not present

## 2018-04-20 DIAGNOSIS — I480 Paroxysmal atrial fibrillation: Secondary | ICD-10-CM | POA: Diagnosis not present

## 2018-04-20 DIAGNOSIS — R2681 Unsteadiness on feet: Secondary | ICD-10-CM | POA: Diagnosis not present

## 2018-04-20 DIAGNOSIS — N184 Chronic kidney disease, stage 4 (severe): Secondary | ICD-10-CM | POA: Diagnosis not present

## 2018-04-23 DIAGNOSIS — R2681 Unsteadiness on feet: Secondary | ICD-10-CM | POA: Diagnosis not present

## 2018-04-23 DIAGNOSIS — M6281 Muscle weakness (generalized): Secondary | ICD-10-CM | POA: Diagnosis not present

## 2018-04-23 DIAGNOSIS — N184 Chronic kidney disease, stage 4 (severe): Secondary | ICD-10-CM | POA: Diagnosis not present

## 2018-04-23 DIAGNOSIS — I480 Paroxysmal atrial fibrillation: Secondary | ICD-10-CM | POA: Diagnosis not present

## 2018-04-23 DIAGNOSIS — G9341 Metabolic encephalopathy: Secondary | ICD-10-CM | POA: Diagnosis not present

## 2018-04-23 DIAGNOSIS — R2689 Other abnormalities of gait and mobility: Secondary | ICD-10-CM | POA: Diagnosis not present

## 2018-04-24 DIAGNOSIS — M6281 Muscle weakness (generalized): Secondary | ICD-10-CM | POA: Diagnosis not present

## 2018-04-24 DIAGNOSIS — I480 Paroxysmal atrial fibrillation: Secondary | ICD-10-CM | POA: Diagnosis not present

## 2018-04-24 DIAGNOSIS — G9341 Metabolic encephalopathy: Secondary | ICD-10-CM | POA: Diagnosis not present

## 2018-04-24 DIAGNOSIS — R2689 Other abnormalities of gait and mobility: Secondary | ICD-10-CM | POA: Diagnosis not present

## 2018-04-24 DIAGNOSIS — R2681 Unsteadiness on feet: Secondary | ICD-10-CM | POA: Diagnosis not present

## 2018-04-24 DIAGNOSIS — N184 Chronic kidney disease, stage 4 (severe): Secondary | ICD-10-CM | POA: Diagnosis not present

## 2018-04-25 DIAGNOSIS — R2689 Other abnormalities of gait and mobility: Secondary | ICD-10-CM | POA: Diagnosis not present

## 2018-04-25 DIAGNOSIS — G9341 Metabolic encephalopathy: Secondary | ICD-10-CM | POA: Diagnosis not present

## 2018-04-25 DIAGNOSIS — R2681 Unsteadiness on feet: Secondary | ICD-10-CM | POA: Diagnosis not present

## 2018-04-25 DIAGNOSIS — I480 Paroxysmal atrial fibrillation: Secondary | ICD-10-CM | POA: Diagnosis not present

## 2018-04-25 DIAGNOSIS — M6281 Muscle weakness (generalized): Secondary | ICD-10-CM | POA: Diagnosis not present

## 2018-04-25 DIAGNOSIS — N184 Chronic kidney disease, stage 4 (severe): Secondary | ICD-10-CM | POA: Diagnosis not present

## 2018-04-26 DIAGNOSIS — R2689 Other abnormalities of gait and mobility: Secondary | ICD-10-CM | POA: Diagnosis not present

## 2018-04-26 DIAGNOSIS — M6281 Muscle weakness (generalized): Secondary | ICD-10-CM | POA: Diagnosis not present

## 2018-04-26 DIAGNOSIS — I480 Paroxysmal atrial fibrillation: Secondary | ICD-10-CM | POA: Diagnosis not present

## 2018-04-26 DIAGNOSIS — G9341 Metabolic encephalopathy: Secondary | ICD-10-CM | POA: Diagnosis not present

## 2018-04-26 DIAGNOSIS — N184 Chronic kidney disease, stage 4 (severe): Secondary | ICD-10-CM | POA: Diagnosis not present

## 2018-04-26 DIAGNOSIS — R2681 Unsteadiness on feet: Secondary | ICD-10-CM | POA: Diagnosis not present

## 2018-04-27 DIAGNOSIS — R2689 Other abnormalities of gait and mobility: Secondary | ICD-10-CM | POA: Diagnosis not present

## 2018-04-27 DIAGNOSIS — R2681 Unsteadiness on feet: Secondary | ICD-10-CM | POA: Diagnosis not present

## 2018-04-27 DIAGNOSIS — I480 Paroxysmal atrial fibrillation: Secondary | ICD-10-CM | POA: Diagnosis not present

## 2018-04-27 DIAGNOSIS — M6281 Muscle weakness (generalized): Secondary | ICD-10-CM | POA: Diagnosis not present

## 2018-04-27 DIAGNOSIS — G9341 Metabolic encephalopathy: Secondary | ICD-10-CM | POA: Diagnosis not present

## 2018-04-27 DIAGNOSIS — N184 Chronic kidney disease, stage 4 (severe): Secondary | ICD-10-CM | POA: Diagnosis not present

## 2018-04-30 DIAGNOSIS — M6281 Muscle weakness (generalized): Secondary | ICD-10-CM | POA: Diagnosis not present

## 2018-04-30 DIAGNOSIS — R2689 Other abnormalities of gait and mobility: Secondary | ICD-10-CM | POA: Diagnosis not present

## 2018-04-30 DIAGNOSIS — R2681 Unsteadiness on feet: Secondary | ICD-10-CM | POA: Diagnosis not present

## 2018-04-30 DIAGNOSIS — N184 Chronic kidney disease, stage 4 (severe): Secondary | ICD-10-CM | POA: Diagnosis not present

## 2018-04-30 DIAGNOSIS — I480 Paroxysmal atrial fibrillation: Secondary | ICD-10-CM | POA: Diagnosis not present

## 2018-04-30 DIAGNOSIS — G9341 Metabolic encephalopathy: Secondary | ICD-10-CM | POA: Diagnosis not present

## 2018-05-01 DIAGNOSIS — I480 Paroxysmal atrial fibrillation: Secondary | ICD-10-CM | POA: Diagnosis not present

## 2018-05-01 DIAGNOSIS — G9341 Metabolic encephalopathy: Secondary | ICD-10-CM | POA: Diagnosis not present

## 2018-05-01 DIAGNOSIS — M6281 Muscle weakness (generalized): Secondary | ICD-10-CM | POA: Diagnosis not present

## 2018-05-01 DIAGNOSIS — R2681 Unsteadiness on feet: Secondary | ICD-10-CM | POA: Diagnosis not present

## 2018-05-01 DIAGNOSIS — N184 Chronic kidney disease, stage 4 (severe): Secondary | ICD-10-CM | POA: Diagnosis not present

## 2018-05-01 DIAGNOSIS — R2689 Other abnormalities of gait and mobility: Secondary | ICD-10-CM | POA: Diagnosis not present

## 2018-05-02 DIAGNOSIS — R2681 Unsteadiness on feet: Secondary | ICD-10-CM | POA: Diagnosis not present

## 2018-05-02 DIAGNOSIS — M6281 Muscle weakness (generalized): Secondary | ICD-10-CM | POA: Diagnosis not present

## 2018-05-02 DIAGNOSIS — R2689 Other abnormalities of gait and mobility: Secondary | ICD-10-CM | POA: Diagnosis not present

## 2018-05-02 DIAGNOSIS — I480 Paroxysmal atrial fibrillation: Secondary | ICD-10-CM | POA: Diagnosis not present

## 2018-05-02 DIAGNOSIS — Z23 Encounter for immunization: Secondary | ICD-10-CM | POA: Diagnosis not present

## 2018-05-02 DIAGNOSIS — N184 Chronic kidney disease, stage 4 (severe): Secondary | ICD-10-CM | POA: Diagnosis not present

## 2018-05-02 DIAGNOSIS — G9341 Metabolic encephalopathy: Secondary | ICD-10-CM | POA: Diagnosis not present

## 2018-05-03 DIAGNOSIS — N184 Chronic kidney disease, stage 4 (severe): Secondary | ICD-10-CM | POA: Diagnosis not present

## 2018-05-03 DIAGNOSIS — G9341 Metabolic encephalopathy: Secondary | ICD-10-CM | POA: Diagnosis not present

## 2018-05-03 DIAGNOSIS — R2689 Other abnormalities of gait and mobility: Secondary | ICD-10-CM | POA: Diagnosis not present

## 2018-05-03 DIAGNOSIS — R2681 Unsteadiness on feet: Secondary | ICD-10-CM | POA: Diagnosis not present

## 2018-05-03 DIAGNOSIS — M6281 Muscle weakness (generalized): Secondary | ICD-10-CM | POA: Diagnosis not present

## 2018-05-03 DIAGNOSIS — I480 Paroxysmal atrial fibrillation: Secondary | ICD-10-CM | POA: Diagnosis not present

## 2018-05-04 DIAGNOSIS — I480 Paroxysmal atrial fibrillation: Secondary | ICD-10-CM | POA: Diagnosis not present

## 2018-05-04 DIAGNOSIS — M6281 Muscle weakness (generalized): Secondary | ICD-10-CM | POA: Diagnosis not present

## 2018-05-04 DIAGNOSIS — G9341 Metabolic encephalopathy: Secondary | ICD-10-CM | POA: Diagnosis not present

## 2018-05-04 DIAGNOSIS — R2689 Other abnormalities of gait and mobility: Secondary | ICD-10-CM | POA: Diagnosis not present

## 2018-05-04 DIAGNOSIS — R2681 Unsteadiness on feet: Secondary | ICD-10-CM | POA: Diagnosis not present

## 2018-05-04 DIAGNOSIS — N184 Chronic kidney disease, stage 4 (severe): Secondary | ICD-10-CM | POA: Diagnosis not present

## 2018-05-07 DIAGNOSIS — M6281 Muscle weakness (generalized): Secondary | ICD-10-CM | POA: Diagnosis not present

## 2018-05-07 DIAGNOSIS — I480 Paroxysmal atrial fibrillation: Secondary | ICD-10-CM | POA: Diagnosis not present

## 2018-05-07 DIAGNOSIS — R2689 Other abnormalities of gait and mobility: Secondary | ICD-10-CM | POA: Diagnosis not present

## 2018-05-07 DIAGNOSIS — G9341 Metabolic encephalopathy: Secondary | ICD-10-CM | POA: Diagnosis not present

## 2018-05-07 DIAGNOSIS — N184 Chronic kidney disease, stage 4 (severe): Secondary | ICD-10-CM | POA: Diagnosis not present

## 2018-05-07 DIAGNOSIS — R2681 Unsteadiness on feet: Secondary | ICD-10-CM | POA: Diagnosis not present

## 2018-05-08 DIAGNOSIS — R2689 Other abnormalities of gait and mobility: Secondary | ICD-10-CM | POA: Diagnosis not present

## 2018-05-08 DIAGNOSIS — E669 Obesity, unspecified: Secondary | ICD-10-CM | POA: Diagnosis not present

## 2018-05-08 DIAGNOSIS — N184 Chronic kidney disease, stage 4 (severe): Secondary | ICD-10-CM | POA: Diagnosis not present

## 2018-05-08 DIAGNOSIS — R0600 Dyspnea, unspecified: Secondary | ICD-10-CM | POA: Diagnosis not present

## 2018-05-08 DIAGNOSIS — R2681 Unsteadiness on feet: Secondary | ICD-10-CM | POA: Diagnosis not present

## 2018-05-08 DIAGNOSIS — R609 Edema, unspecified: Secondary | ICD-10-CM | POA: Diagnosis not present

## 2018-05-08 DIAGNOSIS — M6281 Muscle weakness (generalized): Secondary | ICD-10-CM | POA: Diagnosis not present

## 2018-05-08 DIAGNOSIS — G9341 Metabolic encephalopathy: Secondary | ICD-10-CM | POA: Diagnosis not present

## 2018-05-08 DIAGNOSIS — I480 Paroxysmal atrial fibrillation: Secondary | ICD-10-CM | POA: Diagnosis not present

## 2018-05-08 DIAGNOSIS — I739 Peripheral vascular disease, unspecified: Secondary | ICD-10-CM | POA: Diagnosis not present

## 2018-05-08 DIAGNOSIS — N189 Chronic kidney disease, unspecified: Secondary | ICD-10-CM | POA: Diagnosis not present

## 2018-05-09 DIAGNOSIS — M6281 Muscle weakness (generalized): Secondary | ICD-10-CM | POA: Diagnosis not present

## 2018-05-09 DIAGNOSIS — R2689 Other abnormalities of gait and mobility: Secondary | ICD-10-CM | POA: Diagnosis not present

## 2018-05-09 DIAGNOSIS — R2681 Unsteadiness on feet: Secondary | ICD-10-CM | POA: Diagnosis not present

## 2018-05-09 DIAGNOSIS — R609 Edema, unspecified: Secondary | ICD-10-CM | POA: Diagnosis not present

## 2018-05-09 DIAGNOSIS — N184 Chronic kidney disease, stage 4 (severe): Secondary | ICD-10-CM | POA: Diagnosis not present

## 2018-05-09 DIAGNOSIS — G9341 Metabolic encephalopathy: Secondary | ICD-10-CM | POA: Diagnosis not present

## 2018-05-09 DIAGNOSIS — I480 Paroxysmal atrial fibrillation: Secondary | ICD-10-CM | POA: Diagnosis not present

## 2018-05-10 DIAGNOSIS — N184 Chronic kidney disease, stage 4 (severe): Secondary | ICD-10-CM | POA: Diagnosis not present

## 2018-05-10 DIAGNOSIS — R2689 Other abnormalities of gait and mobility: Secondary | ICD-10-CM | POA: Diagnosis not present

## 2018-05-10 DIAGNOSIS — R609 Edema, unspecified: Secondary | ICD-10-CM | POA: Diagnosis not present

## 2018-05-10 DIAGNOSIS — G9341 Metabolic encephalopathy: Secondary | ICD-10-CM | POA: Diagnosis not present

## 2018-05-10 DIAGNOSIS — R2681 Unsteadiness on feet: Secondary | ICD-10-CM | POA: Diagnosis not present

## 2018-05-10 DIAGNOSIS — I480 Paroxysmal atrial fibrillation: Secondary | ICD-10-CM | POA: Diagnosis not present

## 2018-05-10 DIAGNOSIS — M6281 Muscle weakness (generalized): Secondary | ICD-10-CM | POA: Diagnosis not present

## 2018-05-11 DIAGNOSIS — N184 Chronic kidney disease, stage 4 (severe): Secondary | ICD-10-CM | POA: Diagnosis not present

## 2018-05-11 DIAGNOSIS — I480 Paroxysmal atrial fibrillation: Secondary | ICD-10-CM | POA: Diagnosis not present

## 2018-05-11 DIAGNOSIS — R2681 Unsteadiness on feet: Secondary | ICD-10-CM | POA: Diagnosis not present

## 2018-05-11 DIAGNOSIS — R2689 Other abnormalities of gait and mobility: Secondary | ICD-10-CM | POA: Diagnosis not present

## 2018-05-11 DIAGNOSIS — G9341 Metabolic encephalopathy: Secondary | ICD-10-CM | POA: Diagnosis not present

## 2018-05-11 DIAGNOSIS — M6281 Muscle weakness (generalized): Secondary | ICD-10-CM | POA: Diagnosis not present

## 2018-05-11 DIAGNOSIS — Z79899 Other long term (current) drug therapy: Secondary | ICD-10-CM | POA: Diagnosis not present

## 2018-05-12 DIAGNOSIS — G9341 Metabolic encephalopathy: Secondary | ICD-10-CM | POA: Diagnosis not present

## 2018-05-12 DIAGNOSIS — M6281 Muscle weakness (generalized): Secondary | ICD-10-CM | POA: Diagnosis not present

## 2018-05-12 DIAGNOSIS — I480 Paroxysmal atrial fibrillation: Secondary | ICD-10-CM | POA: Diagnosis not present

## 2018-05-12 DIAGNOSIS — R2681 Unsteadiness on feet: Secondary | ICD-10-CM | POA: Diagnosis not present

## 2018-05-12 DIAGNOSIS — R2689 Other abnormalities of gait and mobility: Secondary | ICD-10-CM | POA: Diagnosis not present

## 2018-05-12 DIAGNOSIS — N184 Chronic kidney disease, stage 4 (severe): Secondary | ICD-10-CM | POA: Diagnosis not present

## 2018-05-14 DIAGNOSIS — N189 Chronic kidney disease, unspecified: Secondary | ICD-10-CM | POA: Diagnosis not present

## 2018-05-14 DIAGNOSIS — R4189 Other symptoms and signs involving cognitive functions and awareness: Secondary | ICD-10-CM | POA: Diagnosis not present

## 2018-05-14 DIAGNOSIS — R609 Edema, unspecified: Secondary | ICD-10-CM | POA: Diagnosis not present

## 2018-05-14 DIAGNOSIS — I10 Essential (primary) hypertension: Secondary | ICD-10-CM | POA: Diagnosis not present

## 2018-05-15 DIAGNOSIS — R2689 Other abnormalities of gait and mobility: Secondary | ICD-10-CM | POA: Diagnosis not present

## 2018-05-15 DIAGNOSIS — R2681 Unsteadiness on feet: Secondary | ICD-10-CM | POA: Diagnosis not present

## 2018-05-15 DIAGNOSIS — G9341 Metabolic encephalopathy: Secondary | ICD-10-CM | POA: Diagnosis not present

## 2018-05-15 DIAGNOSIS — M6281 Muscle weakness (generalized): Secondary | ICD-10-CM | POA: Diagnosis not present

## 2018-05-15 DIAGNOSIS — N184 Chronic kidney disease, stage 4 (severe): Secondary | ICD-10-CM | POA: Diagnosis not present

## 2018-05-15 DIAGNOSIS — I480 Paroxysmal atrial fibrillation: Secondary | ICD-10-CM | POA: Diagnosis not present

## 2018-05-16 DIAGNOSIS — G9341 Metabolic encephalopathy: Secondary | ICD-10-CM | POA: Diagnosis not present

## 2018-05-16 DIAGNOSIS — N184 Chronic kidney disease, stage 4 (severe): Secondary | ICD-10-CM | POA: Diagnosis not present

## 2018-05-16 DIAGNOSIS — M6281 Muscle weakness (generalized): Secondary | ICD-10-CM | POA: Diagnosis not present

## 2018-05-16 DIAGNOSIS — R2689 Other abnormalities of gait and mobility: Secondary | ICD-10-CM | POA: Diagnosis not present

## 2018-05-16 DIAGNOSIS — R2681 Unsteadiness on feet: Secondary | ICD-10-CM | POA: Diagnosis not present

## 2018-05-16 DIAGNOSIS — I480 Paroxysmal atrial fibrillation: Secondary | ICD-10-CM | POA: Diagnosis not present

## 2018-05-17 DIAGNOSIS — G9341 Metabolic encephalopathy: Secondary | ICD-10-CM | POA: Diagnosis not present

## 2018-05-17 DIAGNOSIS — I480 Paroxysmal atrial fibrillation: Secondary | ICD-10-CM | POA: Diagnosis not present

## 2018-05-17 DIAGNOSIS — N184 Chronic kidney disease, stage 4 (severe): Secondary | ICD-10-CM | POA: Diagnosis not present

## 2018-05-17 DIAGNOSIS — R2689 Other abnormalities of gait and mobility: Secondary | ICD-10-CM | POA: Diagnosis not present

## 2018-05-17 DIAGNOSIS — R2681 Unsteadiness on feet: Secondary | ICD-10-CM | POA: Diagnosis not present

## 2018-05-17 DIAGNOSIS — M6281 Muscle weakness (generalized): Secondary | ICD-10-CM | POA: Diagnosis not present

## 2018-05-18 DIAGNOSIS — G9341 Metabolic encephalopathy: Secondary | ICD-10-CM | POA: Diagnosis not present

## 2018-05-18 DIAGNOSIS — R2689 Other abnormalities of gait and mobility: Secondary | ICD-10-CM | POA: Diagnosis not present

## 2018-05-18 DIAGNOSIS — N184 Chronic kidney disease, stage 4 (severe): Secondary | ICD-10-CM | POA: Diagnosis not present

## 2018-05-18 DIAGNOSIS — R2681 Unsteadiness on feet: Secondary | ICD-10-CM | POA: Diagnosis not present

## 2018-05-18 DIAGNOSIS — I480 Paroxysmal atrial fibrillation: Secondary | ICD-10-CM | POA: Diagnosis not present

## 2018-05-18 DIAGNOSIS — M6281 Muscle weakness (generalized): Secondary | ICD-10-CM | POA: Diagnosis not present

## 2018-05-21 DIAGNOSIS — M6281 Muscle weakness (generalized): Secondary | ICD-10-CM | POA: Diagnosis not present

## 2018-05-21 DIAGNOSIS — R2681 Unsteadiness on feet: Secondary | ICD-10-CM | POA: Diagnosis not present

## 2018-05-21 DIAGNOSIS — N184 Chronic kidney disease, stage 4 (severe): Secondary | ICD-10-CM | POA: Diagnosis not present

## 2018-05-21 DIAGNOSIS — G9341 Metabolic encephalopathy: Secondary | ICD-10-CM | POA: Diagnosis not present

## 2018-05-21 DIAGNOSIS — I480 Paroxysmal atrial fibrillation: Secondary | ICD-10-CM | POA: Diagnosis not present

## 2018-05-21 DIAGNOSIS — R2689 Other abnormalities of gait and mobility: Secondary | ICD-10-CM | POA: Diagnosis not present

## 2018-05-22 DIAGNOSIS — Z79899 Other long term (current) drug therapy: Secondary | ICD-10-CM | POA: Diagnosis not present

## 2018-05-22 DIAGNOSIS — G9341 Metabolic encephalopathy: Secondary | ICD-10-CM | POA: Diagnosis not present

## 2018-05-22 DIAGNOSIS — N184 Chronic kidney disease, stage 4 (severe): Secondary | ICD-10-CM | POA: Diagnosis not present

## 2018-05-22 DIAGNOSIS — R2689 Other abnormalities of gait and mobility: Secondary | ICD-10-CM | POA: Diagnosis not present

## 2018-05-22 DIAGNOSIS — M6281 Muscle weakness (generalized): Secondary | ICD-10-CM | POA: Diagnosis not present

## 2018-05-22 DIAGNOSIS — I480 Paroxysmal atrial fibrillation: Secondary | ICD-10-CM | POA: Diagnosis not present

## 2018-05-22 DIAGNOSIS — R2681 Unsteadiness on feet: Secondary | ICD-10-CM | POA: Diagnosis not present

## 2018-05-23 DIAGNOSIS — R2689 Other abnormalities of gait and mobility: Secondary | ICD-10-CM | POA: Diagnosis not present

## 2018-05-23 DIAGNOSIS — G9341 Metabolic encephalopathy: Secondary | ICD-10-CM | POA: Diagnosis not present

## 2018-05-23 DIAGNOSIS — R2681 Unsteadiness on feet: Secondary | ICD-10-CM | POA: Diagnosis not present

## 2018-05-23 DIAGNOSIS — I480 Paroxysmal atrial fibrillation: Secondary | ICD-10-CM | POA: Diagnosis not present

## 2018-05-23 DIAGNOSIS — M6281 Muscle weakness (generalized): Secondary | ICD-10-CM | POA: Diagnosis not present

## 2018-05-23 DIAGNOSIS — N184 Chronic kidney disease, stage 4 (severe): Secondary | ICD-10-CM | POA: Diagnosis not present

## 2018-05-24 DIAGNOSIS — R2681 Unsteadiness on feet: Secondary | ICD-10-CM | POA: Diagnosis not present

## 2018-05-24 DIAGNOSIS — G9341 Metabolic encephalopathy: Secondary | ICD-10-CM | POA: Diagnosis not present

## 2018-05-24 DIAGNOSIS — I480 Paroxysmal atrial fibrillation: Secondary | ICD-10-CM | POA: Diagnosis not present

## 2018-05-24 DIAGNOSIS — N184 Chronic kidney disease, stage 4 (severe): Secondary | ICD-10-CM | POA: Diagnosis not present

## 2018-05-24 DIAGNOSIS — M6281 Muscle weakness (generalized): Secondary | ICD-10-CM | POA: Diagnosis not present

## 2018-05-24 DIAGNOSIS — R2689 Other abnormalities of gait and mobility: Secondary | ICD-10-CM | POA: Diagnosis not present

## 2018-05-25 DIAGNOSIS — R2681 Unsteadiness on feet: Secondary | ICD-10-CM | POA: Diagnosis not present

## 2018-05-25 DIAGNOSIS — N184 Chronic kidney disease, stage 4 (severe): Secondary | ICD-10-CM | POA: Diagnosis not present

## 2018-05-25 DIAGNOSIS — G9341 Metabolic encephalopathy: Secondary | ICD-10-CM | POA: Diagnosis not present

## 2018-05-25 DIAGNOSIS — R2689 Other abnormalities of gait and mobility: Secondary | ICD-10-CM | POA: Diagnosis not present

## 2018-05-25 DIAGNOSIS — M6281 Muscle weakness (generalized): Secondary | ICD-10-CM | POA: Diagnosis not present

## 2018-05-25 DIAGNOSIS — I480 Paroxysmal atrial fibrillation: Secondary | ICD-10-CM | POA: Diagnosis not present

## 2018-05-28 DIAGNOSIS — M6281 Muscle weakness (generalized): Secondary | ICD-10-CM | POA: Diagnosis not present

## 2018-05-28 DIAGNOSIS — R2689 Other abnormalities of gait and mobility: Secondary | ICD-10-CM | POA: Diagnosis not present

## 2018-05-28 DIAGNOSIS — G9341 Metabolic encephalopathy: Secondary | ICD-10-CM | POA: Diagnosis not present

## 2018-05-28 DIAGNOSIS — N184 Chronic kidney disease, stage 4 (severe): Secondary | ICD-10-CM | POA: Diagnosis not present

## 2018-05-28 DIAGNOSIS — I480 Paroxysmal atrial fibrillation: Secondary | ICD-10-CM | POA: Diagnosis not present

## 2018-05-28 DIAGNOSIS — R2681 Unsteadiness on feet: Secondary | ICD-10-CM | POA: Diagnosis not present

## 2018-05-29 DIAGNOSIS — R2689 Other abnormalities of gait and mobility: Secondary | ICD-10-CM | POA: Diagnosis not present

## 2018-05-29 DIAGNOSIS — M6281 Muscle weakness (generalized): Secondary | ICD-10-CM | POA: Diagnosis not present

## 2018-05-29 DIAGNOSIS — I480 Paroxysmal atrial fibrillation: Secondary | ICD-10-CM | POA: Diagnosis not present

## 2018-05-29 DIAGNOSIS — R2681 Unsteadiness on feet: Secondary | ICD-10-CM | POA: Diagnosis not present

## 2018-05-29 DIAGNOSIS — G9341 Metabolic encephalopathy: Secondary | ICD-10-CM | POA: Diagnosis not present

## 2018-05-29 DIAGNOSIS — N184 Chronic kidney disease, stage 4 (severe): Secondary | ICD-10-CM | POA: Diagnosis not present

## 2018-05-30 DIAGNOSIS — M6281 Muscle weakness (generalized): Secondary | ICD-10-CM | POA: Diagnosis not present

## 2018-05-30 DIAGNOSIS — R2681 Unsteadiness on feet: Secondary | ICD-10-CM | POA: Diagnosis not present

## 2018-05-30 DIAGNOSIS — G9341 Metabolic encephalopathy: Secondary | ICD-10-CM | POA: Diagnosis not present

## 2018-05-30 DIAGNOSIS — R2689 Other abnormalities of gait and mobility: Secondary | ICD-10-CM | POA: Diagnosis not present

## 2018-05-30 DIAGNOSIS — N184 Chronic kidney disease, stage 4 (severe): Secondary | ICD-10-CM | POA: Diagnosis not present

## 2018-05-30 DIAGNOSIS — I480 Paroxysmal atrial fibrillation: Secondary | ICD-10-CM | POA: Diagnosis not present

## 2018-05-31 DIAGNOSIS — N184 Chronic kidney disease, stage 4 (severe): Secondary | ICD-10-CM | POA: Diagnosis not present

## 2018-05-31 DIAGNOSIS — I480 Paroxysmal atrial fibrillation: Secondary | ICD-10-CM | POA: Diagnosis not present

## 2018-05-31 DIAGNOSIS — G9341 Metabolic encephalopathy: Secondary | ICD-10-CM | POA: Diagnosis not present

## 2018-05-31 DIAGNOSIS — R2681 Unsteadiness on feet: Secondary | ICD-10-CM | POA: Diagnosis not present

## 2018-05-31 DIAGNOSIS — R2689 Other abnormalities of gait and mobility: Secondary | ICD-10-CM | POA: Diagnosis not present

## 2018-05-31 DIAGNOSIS — M6281 Muscle weakness (generalized): Secondary | ICD-10-CM | POA: Diagnosis not present

## 2018-06-01 DIAGNOSIS — G9341 Metabolic encephalopathy: Secondary | ICD-10-CM | POA: Diagnosis not present

## 2018-06-01 DIAGNOSIS — N184 Chronic kidney disease, stage 4 (severe): Secondary | ICD-10-CM | POA: Diagnosis not present

## 2018-06-01 DIAGNOSIS — I480 Paroxysmal atrial fibrillation: Secondary | ICD-10-CM | POA: Diagnosis not present

## 2018-06-01 DIAGNOSIS — R2681 Unsteadiness on feet: Secondary | ICD-10-CM | POA: Diagnosis not present

## 2018-06-01 DIAGNOSIS — M6281 Muscle weakness (generalized): Secondary | ICD-10-CM | POA: Diagnosis not present

## 2018-06-01 DIAGNOSIS — R2689 Other abnormalities of gait and mobility: Secondary | ICD-10-CM | POA: Diagnosis not present

## 2018-06-02 DIAGNOSIS — R2689 Other abnormalities of gait and mobility: Secondary | ICD-10-CM | POA: Diagnosis not present

## 2018-06-02 DIAGNOSIS — N184 Chronic kidney disease, stage 4 (severe): Secondary | ICD-10-CM | POA: Diagnosis not present

## 2018-06-02 DIAGNOSIS — R2681 Unsteadiness on feet: Secondary | ICD-10-CM | POA: Diagnosis not present

## 2018-06-02 DIAGNOSIS — M6281 Muscle weakness (generalized): Secondary | ICD-10-CM | POA: Diagnosis not present

## 2018-06-02 DIAGNOSIS — I480 Paroxysmal atrial fibrillation: Secondary | ICD-10-CM | POA: Diagnosis not present

## 2018-06-02 DIAGNOSIS — G9341 Metabolic encephalopathy: Secondary | ICD-10-CM | POA: Diagnosis not present

## 2018-06-04 DIAGNOSIS — I480 Paroxysmal atrial fibrillation: Secondary | ICD-10-CM | POA: Diagnosis not present

## 2018-06-04 DIAGNOSIS — R2681 Unsteadiness on feet: Secondary | ICD-10-CM | POA: Diagnosis not present

## 2018-06-04 DIAGNOSIS — R2689 Other abnormalities of gait and mobility: Secondary | ICD-10-CM | POA: Diagnosis not present

## 2018-06-04 DIAGNOSIS — G9341 Metabolic encephalopathy: Secondary | ICD-10-CM | POA: Diagnosis not present

## 2018-06-04 DIAGNOSIS — N184 Chronic kidney disease, stage 4 (severe): Secondary | ICD-10-CM | POA: Diagnosis not present

## 2018-06-04 DIAGNOSIS — M6281 Muscle weakness (generalized): Secondary | ICD-10-CM | POA: Diagnosis not present

## 2018-06-05 DIAGNOSIS — I480 Paroxysmal atrial fibrillation: Secondary | ICD-10-CM | POA: Diagnosis not present

## 2018-06-05 DIAGNOSIS — R2689 Other abnormalities of gait and mobility: Secondary | ICD-10-CM | POA: Diagnosis not present

## 2018-06-05 DIAGNOSIS — G9341 Metabolic encephalopathy: Secondary | ICD-10-CM | POA: Diagnosis not present

## 2018-06-05 DIAGNOSIS — N184 Chronic kidney disease, stage 4 (severe): Secondary | ICD-10-CM | POA: Diagnosis not present

## 2018-06-05 DIAGNOSIS — R2681 Unsteadiness on feet: Secondary | ICD-10-CM | POA: Diagnosis not present

## 2018-06-05 DIAGNOSIS — M6281 Muscle weakness (generalized): Secondary | ICD-10-CM | POA: Diagnosis not present

## 2018-06-06 DIAGNOSIS — R2689 Other abnormalities of gait and mobility: Secondary | ICD-10-CM | POA: Diagnosis not present

## 2018-06-06 DIAGNOSIS — G9341 Metabolic encephalopathy: Secondary | ICD-10-CM | POA: Diagnosis not present

## 2018-06-06 DIAGNOSIS — I480 Paroxysmal atrial fibrillation: Secondary | ICD-10-CM | POA: Diagnosis not present

## 2018-06-06 DIAGNOSIS — N184 Chronic kidney disease, stage 4 (severe): Secondary | ICD-10-CM | POA: Diagnosis not present

## 2018-06-06 DIAGNOSIS — R2681 Unsteadiness on feet: Secondary | ICD-10-CM | POA: Diagnosis not present

## 2018-06-06 DIAGNOSIS — M6281 Muscle weakness (generalized): Secondary | ICD-10-CM | POA: Diagnosis not present

## 2018-06-07 DIAGNOSIS — M6281 Muscle weakness (generalized): Secondary | ICD-10-CM | POA: Diagnosis not present

## 2018-06-07 DIAGNOSIS — I480 Paroxysmal atrial fibrillation: Secondary | ICD-10-CM | POA: Diagnosis not present

## 2018-06-07 DIAGNOSIS — G9341 Metabolic encephalopathy: Secondary | ICD-10-CM | POA: Diagnosis not present

## 2018-06-07 DIAGNOSIS — R2689 Other abnormalities of gait and mobility: Secondary | ICD-10-CM | POA: Diagnosis not present

## 2018-06-07 DIAGNOSIS — N184 Chronic kidney disease, stage 4 (severe): Secondary | ICD-10-CM | POA: Diagnosis not present

## 2018-06-07 DIAGNOSIS — R2681 Unsteadiness on feet: Secondary | ICD-10-CM | POA: Diagnosis not present

## 2018-06-08 DIAGNOSIS — N184 Chronic kidney disease, stage 4 (severe): Secondary | ICD-10-CM | POA: Diagnosis not present

## 2018-06-08 DIAGNOSIS — I480 Paroxysmal atrial fibrillation: Secondary | ICD-10-CM | POA: Diagnosis not present

## 2018-06-08 DIAGNOSIS — R2689 Other abnormalities of gait and mobility: Secondary | ICD-10-CM | POA: Diagnosis not present

## 2018-06-08 DIAGNOSIS — G9341 Metabolic encephalopathy: Secondary | ICD-10-CM | POA: Diagnosis not present

## 2018-06-08 DIAGNOSIS — R2681 Unsteadiness on feet: Secondary | ICD-10-CM | POA: Diagnosis not present

## 2018-06-08 DIAGNOSIS — M6281 Muscle weakness (generalized): Secondary | ICD-10-CM | POA: Diagnosis not present

## 2018-06-09 DIAGNOSIS — M6281 Muscle weakness (generalized): Secondary | ICD-10-CM | POA: Diagnosis not present

## 2018-06-09 DIAGNOSIS — N184 Chronic kidney disease, stage 4 (severe): Secondary | ICD-10-CM | POA: Diagnosis not present

## 2018-06-09 DIAGNOSIS — I480 Paroxysmal atrial fibrillation: Secondary | ICD-10-CM | POA: Diagnosis not present

## 2018-06-09 DIAGNOSIS — R2681 Unsteadiness on feet: Secondary | ICD-10-CM | POA: Diagnosis not present

## 2018-06-09 DIAGNOSIS — G9341 Metabolic encephalopathy: Secondary | ICD-10-CM | POA: Diagnosis not present

## 2018-06-09 DIAGNOSIS — R2689 Other abnormalities of gait and mobility: Secondary | ICD-10-CM | POA: Diagnosis not present

## 2018-06-11 DIAGNOSIS — R278 Other lack of coordination: Secondary | ICD-10-CM | POA: Diagnosis not present

## 2018-06-11 DIAGNOSIS — M6281 Muscle weakness (generalized): Secondary | ICD-10-CM | POA: Diagnosis not present

## 2018-06-11 DIAGNOSIS — R2689 Other abnormalities of gait and mobility: Secondary | ICD-10-CM | POA: Diagnosis not present

## 2018-06-11 DIAGNOSIS — R2681 Unsteadiness on feet: Secondary | ICD-10-CM | POA: Diagnosis not present

## 2018-06-11 DIAGNOSIS — N184 Chronic kidney disease, stage 4 (severe): Secondary | ICD-10-CM | POA: Diagnosis not present

## 2018-06-11 DIAGNOSIS — G9341 Metabolic encephalopathy: Secondary | ICD-10-CM | POA: Diagnosis not present

## 2018-06-11 DIAGNOSIS — I480 Paroxysmal atrial fibrillation: Secondary | ICD-10-CM | POA: Diagnosis not present

## 2018-06-12 DIAGNOSIS — N184 Chronic kidney disease, stage 4 (severe): Secondary | ICD-10-CM | POA: Diagnosis not present

## 2018-06-12 DIAGNOSIS — R2689 Other abnormalities of gait and mobility: Secondary | ICD-10-CM | POA: Diagnosis not present

## 2018-06-12 DIAGNOSIS — R278 Other lack of coordination: Secondary | ICD-10-CM | POA: Diagnosis not present

## 2018-06-12 DIAGNOSIS — I480 Paroxysmal atrial fibrillation: Secondary | ICD-10-CM | POA: Diagnosis not present

## 2018-06-12 DIAGNOSIS — M6281 Muscle weakness (generalized): Secondary | ICD-10-CM | POA: Diagnosis not present

## 2018-06-12 DIAGNOSIS — G9341 Metabolic encephalopathy: Secondary | ICD-10-CM | POA: Diagnosis not present

## 2018-06-13 DIAGNOSIS — G9341 Metabolic encephalopathy: Secondary | ICD-10-CM | POA: Diagnosis not present

## 2018-06-13 DIAGNOSIS — M6281 Muscle weakness (generalized): Secondary | ICD-10-CM | POA: Diagnosis not present

## 2018-06-13 DIAGNOSIS — I480 Paroxysmal atrial fibrillation: Secondary | ICD-10-CM | POA: Diagnosis not present

## 2018-06-13 DIAGNOSIS — R278 Other lack of coordination: Secondary | ICD-10-CM | POA: Diagnosis not present

## 2018-06-13 DIAGNOSIS — N184 Chronic kidney disease, stage 4 (severe): Secondary | ICD-10-CM | POA: Diagnosis not present

## 2018-06-13 DIAGNOSIS — R2689 Other abnormalities of gait and mobility: Secondary | ICD-10-CM | POA: Diagnosis not present

## 2018-06-14 DIAGNOSIS — R2689 Other abnormalities of gait and mobility: Secondary | ICD-10-CM | POA: Diagnosis not present

## 2018-06-14 DIAGNOSIS — R278 Other lack of coordination: Secondary | ICD-10-CM | POA: Diagnosis not present

## 2018-06-14 DIAGNOSIS — M6281 Muscle weakness (generalized): Secondary | ICD-10-CM | POA: Diagnosis not present

## 2018-06-14 DIAGNOSIS — I480 Paroxysmal atrial fibrillation: Secondary | ICD-10-CM | POA: Diagnosis not present

## 2018-06-14 DIAGNOSIS — N184 Chronic kidney disease, stage 4 (severe): Secondary | ICD-10-CM | POA: Diagnosis not present

## 2018-06-14 DIAGNOSIS — G9341 Metabolic encephalopathy: Secondary | ICD-10-CM | POA: Diagnosis not present

## 2018-06-15 DIAGNOSIS — G9341 Metabolic encephalopathy: Secondary | ICD-10-CM | POA: Diagnosis not present

## 2018-06-15 DIAGNOSIS — I10 Essential (primary) hypertension: Secondary | ICD-10-CM | POA: Diagnosis not present

## 2018-06-15 DIAGNOSIS — N184 Chronic kidney disease, stage 4 (severe): Secondary | ICD-10-CM | POA: Diagnosis not present

## 2018-06-15 DIAGNOSIS — M6281 Muscle weakness (generalized): Secondary | ICD-10-CM | POA: Diagnosis not present

## 2018-06-15 DIAGNOSIS — E669 Obesity, unspecified: Secondary | ICD-10-CM | POA: Diagnosis not present

## 2018-06-15 DIAGNOSIS — G4730 Sleep apnea, unspecified: Secondary | ICD-10-CM | POA: Diagnosis not present

## 2018-06-15 DIAGNOSIS — I480 Paroxysmal atrial fibrillation: Secondary | ICD-10-CM | POA: Diagnosis not present

## 2018-06-15 DIAGNOSIS — R2689 Other abnormalities of gait and mobility: Secondary | ICD-10-CM | POA: Diagnosis not present

## 2018-06-15 DIAGNOSIS — R278 Other lack of coordination: Secondary | ICD-10-CM | POA: Diagnosis not present

## 2018-06-15 DIAGNOSIS — N189 Chronic kidney disease, unspecified: Secondary | ICD-10-CM | POA: Diagnosis not present

## 2018-06-16 DIAGNOSIS — G9341 Metabolic encephalopathy: Secondary | ICD-10-CM | POA: Diagnosis not present

## 2018-06-16 DIAGNOSIS — N184 Chronic kidney disease, stage 4 (severe): Secondary | ICD-10-CM | POA: Diagnosis not present

## 2018-06-16 DIAGNOSIS — R278 Other lack of coordination: Secondary | ICD-10-CM | POA: Diagnosis not present

## 2018-06-16 DIAGNOSIS — M6281 Muscle weakness (generalized): Secondary | ICD-10-CM | POA: Diagnosis not present

## 2018-06-16 DIAGNOSIS — I480 Paroxysmal atrial fibrillation: Secondary | ICD-10-CM | POA: Diagnosis not present

## 2018-06-16 DIAGNOSIS — R2689 Other abnormalities of gait and mobility: Secondary | ICD-10-CM | POA: Diagnosis not present

## 2018-06-18 DIAGNOSIS — G9341 Metabolic encephalopathy: Secondary | ICD-10-CM | POA: Diagnosis not present

## 2018-06-18 DIAGNOSIS — M6281 Muscle weakness (generalized): Secondary | ICD-10-CM | POA: Diagnosis not present

## 2018-06-18 DIAGNOSIS — N184 Chronic kidney disease, stage 4 (severe): Secondary | ICD-10-CM | POA: Diagnosis not present

## 2018-06-18 DIAGNOSIS — I480 Paroxysmal atrial fibrillation: Secondary | ICD-10-CM | POA: Diagnosis not present

## 2018-06-18 DIAGNOSIS — R278 Other lack of coordination: Secondary | ICD-10-CM | POA: Diagnosis not present

## 2018-06-18 DIAGNOSIS — R2689 Other abnormalities of gait and mobility: Secondary | ICD-10-CM | POA: Diagnosis not present

## 2018-06-19 DIAGNOSIS — I480 Paroxysmal atrial fibrillation: Secondary | ICD-10-CM | POA: Diagnosis not present

## 2018-06-19 DIAGNOSIS — N184 Chronic kidney disease, stage 4 (severe): Secondary | ICD-10-CM | POA: Diagnosis not present

## 2018-06-19 DIAGNOSIS — G9341 Metabolic encephalopathy: Secondary | ICD-10-CM | POA: Diagnosis not present

## 2018-06-19 DIAGNOSIS — M6281 Muscle weakness (generalized): Secondary | ICD-10-CM | POA: Diagnosis not present

## 2018-06-19 DIAGNOSIS — R278 Other lack of coordination: Secondary | ICD-10-CM | POA: Diagnosis not present

## 2018-06-19 DIAGNOSIS — R2689 Other abnormalities of gait and mobility: Secondary | ICD-10-CM | POA: Diagnosis not present

## 2018-06-20 DIAGNOSIS — I480 Paroxysmal atrial fibrillation: Secondary | ICD-10-CM | POA: Diagnosis not present

## 2018-06-20 DIAGNOSIS — N184 Chronic kidney disease, stage 4 (severe): Secondary | ICD-10-CM | POA: Diagnosis not present

## 2018-06-20 DIAGNOSIS — M6281 Muscle weakness (generalized): Secondary | ICD-10-CM | POA: Diagnosis not present

## 2018-06-20 DIAGNOSIS — R2689 Other abnormalities of gait and mobility: Secondary | ICD-10-CM | POA: Diagnosis not present

## 2018-06-20 DIAGNOSIS — R278 Other lack of coordination: Secondary | ICD-10-CM | POA: Diagnosis not present

## 2018-06-20 DIAGNOSIS — G9341 Metabolic encephalopathy: Secondary | ICD-10-CM | POA: Diagnosis not present

## 2018-06-21 DIAGNOSIS — G9341 Metabolic encephalopathy: Secondary | ICD-10-CM | POA: Diagnosis not present

## 2018-06-21 DIAGNOSIS — M6281 Muscle weakness (generalized): Secondary | ICD-10-CM | POA: Diagnosis not present

## 2018-06-21 DIAGNOSIS — R2689 Other abnormalities of gait and mobility: Secondary | ICD-10-CM | POA: Diagnosis not present

## 2018-06-21 DIAGNOSIS — I480 Paroxysmal atrial fibrillation: Secondary | ICD-10-CM | POA: Diagnosis not present

## 2018-06-21 DIAGNOSIS — N184 Chronic kidney disease, stage 4 (severe): Secondary | ICD-10-CM | POA: Diagnosis not present

## 2018-06-21 DIAGNOSIS — R278 Other lack of coordination: Secondary | ICD-10-CM | POA: Diagnosis not present

## 2018-06-22 DIAGNOSIS — R2689 Other abnormalities of gait and mobility: Secondary | ICD-10-CM | POA: Diagnosis not present

## 2018-06-22 DIAGNOSIS — R278 Other lack of coordination: Secondary | ICD-10-CM | POA: Diagnosis not present

## 2018-06-22 DIAGNOSIS — N184 Chronic kidney disease, stage 4 (severe): Secondary | ICD-10-CM | POA: Diagnosis not present

## 2018-06-22 DIAGNOSIS — M6281 Muscle weakness (generalized): Secondary | ICD-10-CM | POA: Diagnosis not present

## 2018-06-22 DIAGNOSIS — G9341 Metabolic encephalopathy: Secondary | ICD-10-CM | POA: Diagnosis not present

## 2018-06-22 DIAGNOSIS — I480 Paroxysmal atrial fibrillation: Secondary | ICD-10-CM | POA: Diagnosis not present

## 2018-06-23 DIAGNOSIS — M6281 Muscle weakness (generalized): Secondary | ICD-10-CM | POA: Diagnosis not present

## 2018-06-23 DIAGNOSIS — N184 Chronic kidney disease, stage 4 (severe): Secondary | ICD-10-CM | POA: Diagnosis not present

## 2018-06-23 DIAGNOSIS — R278 Other lack of coordination: Secondary | ICD-10-CM | POA: Diagnosis not present

## 2018-06-23 DIAGNOSIS — G9341 Metabolic encephalopathy: Secondary | ICD-10-CM | POA: Diagnosis not present

## 2018-06-23 DIAGNOSIS — I480 Paroxysmal atrial fibrillation: Secondary | ICD-10-CM | POA: Diagnosis not present

## 2018-06-23 DIAGNOSIS — R2689 Other abnormalities of gait and mobility: Secondary | ICD-10-CM | POA: Diagnosis not present

## 2018-06-25 DIAGNOSIS — R2689 Other abnormalities of gait and mobility: Secondary | ICD-10-CM | POA: Diagnosis not present

## 2018-06-25 DIAGNOSIS — I480 Paroxysmal atrial fibrillation: Secondary | ICD-10-CM | POA: Diagnosis not present

## 2018-06-25 DIAGNOSIS — R278 Other lack of coordination: Secondary | ICD-10-CM | POA: Diagnosis not present

## 2018-06-25 DIAGNOSIS — M6281 Muscle weakness (generalized): Secondary | ICD-10-CM | POA: Diagnosis not present

## 2018-06-25 DIAGNOSIS — G9341 Metabolic encephalopathy: Secondary | ICD-10-CM | POA: Diagnosis not present

## 2018-06-25 DIAGNOSIS — N184 Chronic kidney disease, stage 4 (severe): Secondary | ICD-10-CM | POA: Diagnosis not present

## 2018-06-26 DIAGNOSIS — N184 Chronic kidney disease, stage 4 (severe): Secondary | ICD-10-CM | POA: Diagnosis not present

## 2018-06-26 DIAGNOSIS — R278 Other lack of coordination: Secondary | ICD-10-CM | POA: Diagnosis not present

## 2018-06-26 DIAGNOSIS — M6281 Muscle weakness (generalized): Secondary | ICD-10-CM | POA: Diagnosis not present

## 2018-06-26 DIAGNOSIS — I480 Paroxysmal atrial fibrillation: Secondary | ICD-10-CM | POA: Diagnosis not present

## 2018-06-26 DIAGNOSIS — G9341 Metabolic encephalopathy: Secondary | ICD-10-CM | POA: Diagnosis not present

## 2018-06-26 DIAGNOSIS — R2689 Other abnormalities of gait and mobility: Secondary | ICD-10-CM | POA: Diagnosis not present

## 2018-06-27 DIAGNOSIS — N184 Chronic kidney disease, stage 4 (severe): Secondary | ICD-10-CM | POA: Diagnosis not present

## 2018-06-27 DIAGNOSIS — M6281 Muscle weakness (generalized): Secondary | ICD-10-CM | POA: Diagnosis not present

## 2018-06-27 DIAGNOSIS — R2689 Other abnormalities of gait and mobility: Secondary | ICD-10-CM | POA: Diagnosis not present

## 2018-06-27 DIAGNOSIS — G9341 Metabolic encephalopathy: Secondary | ICD-10-CM | POA: Diagnosis not present

## 2018-06-27 DIAGNOSIS — R278 Other lack of coordination: Secondary | ICD-10-CM | POA: Diagnosis not present

## 2018-06-27 DIAGNOSIS — I480 Paroxysmal atrial fibrillation: Secondary | ICD-10-CM | POA: Diagnosis not present

## 2018-06-28 DIAGNOSIS — R278 Other lack of coordination: Secondary | ICD-10-CM | POA: Diagnosis not present

## 2018-06-28 DIAGNOSIS — M6281 Muscle weakness (generalized): Secondary | ICD-10-CM | POA: Diagnosis not present

## 2018-06-28 DIAGNOSIS — I480 Paroxysmal atrial fibrillation: Secondary | ICD-10-CM | POA: Diagnosis not present

## 2018-06-28 DIAGNOSIS — G9341 Metabolic encephalopathy: Secondary | ICD-10-CM | POA: Diagnosis not present

## 2018-06-28 DIAGNOSIS — R2689 Other abnormalities of gait and mobility: Secondary | ICD-10-CM | POA: Diagnosis not present

## 2018-06-28 DIAGNOSIS — N184 Chronic kidney disease, stage 4 (severe): Secondary | ICD-10-CM | POA: Diagnosis not present

## 2018-06-29 DIAGNOSIS — M6281 Muscle weakness (generalized): Secondary | ICD-10-CM | POA: Diagnosis not present

## 2018-06-29 DIAGNOSIS — R2689 Other abnormalities of gait and mobility: Secondary | ICD-10-CM | POA: Diagnosis not present

## 2018-06-29 DIAGNOSIS — G9341 Metabolic encephalopathy: Secondary | ICD-10-CM | POA: Diagnosis not present

## 2018-06-29 DIAGNOSIS — R278 Other lack of coordination: Secondary | ICD-10-CM | POA: Diagnosis not present

## 2018-06-29 DIAGNOSIS — N184 Chronic kidney disease, stage 4 (severe): Secondary | ICD-10-CM | POA: Diagnosis not present

## 2018-06-29 DIAGNOSIS — I480 Paroxysmal atrial fibrillation: Secondary | ICD-10-CM | POA: Diagnosis not present

## 2018-06-30 DIAGNOSIS — R2689 Other abnormalities of gait and mobility: Secondary | ICD-10-CM | POA: Diagnosis not present

## 2018-06-30 DIAGNOSIS — G9341 Metabolic encephalopathy: Secondary | ICD-10-CM | POA: Diagnosis not present

## 2018-06-30 DIAGNOSIS — N184 Chronic kidney disease, stage 4 (severe): Secondary | ICD-10-CM | POA: Diagnosis not present

## 2018-06-30 DIAGNOSIS — I480 Paroxysmal atrial fibrillation: Secondary | ICD-10-CM | POA: Diagnosis not present

## 2018-06-30 DIAGNOSIS — M6281 Muscle weakness (generalized): Secondary | ICD-10-CM | POA: Diagnosis not present

## 2018-06-30 DIAGNOSIS — R278 Other lack of coordination: Secondary | ICD-10-CM | POA: Diagnosis not present

## 2018-07-01 DIAGNOSIS — I480 Paroxysmal atrial fibrillation: Secondary | ICD-10-CM | POA: Diagnosis not present

## 2018-07-01 DIAGNOSIS — G9341 Metabolic encephalopathy: Secondary | ICD-10-CM | POA: Diagnosis not present

## 2018-07-01 DIAGNOSIS — N184 Chronic kidney disease, stage 4 (severe): Secondary | ICD-10-CM | POA: Diagnosis not present

## 2018-07-01 DIAGNOSIS — R278 Other lack of coordination: Secondary | ICD-10-CM | POA: Diagnosis not present

## 2018-07-01 DIAGNOSIS — M6281 Muscle weakness (generalized): Secondary | ICD-10-CM | POA: Diagnosis not present

## 2018-07-01 DIAGNOSIS — R2689 Other abnormalities of gait and mobility: Secondary | ICD-10-CM | POA: Diagnosis not present

## 2018-07-05 DIAGNOSIS — I480 Paroxysmal atrial fibrillation: Secondary | ICD-10-CM | POA: Diagnosis not present

## 2018-07-05 DIAGNOSIS — R2689 Other abnormalities of gait and mobility: Secondary | ICD-10-CM | POA: Diagnosis not present

## 2018-07-05 DIAGNOSIS — R278 Other lack of coordination: Secondary | ICD-10-CM | POA: Diagnosis not present

## 2018-07-05 DIAGNOSIS — G9341 Metabolic encephalopathy: Secondary | ICD-10-CM | POA: Diagnosis not present

## 2018-07-05 DIAGNOSIS — N184 Chronic kidney disease, stage 4 (severe): Secondary | ICD-10-CM | POA: Diagnosis not present

## 2018-07-05 DIAGNOSIS — M6281 Muscle weakness (generalized): Secondary | ICD-10-CM | POA: Diagnosis not present

## 2018-07-06 DIAGNOSIS — R2689 Other abnormalities of gait and mobility: Secondary | ICD-10-CM | POA: Diagnosis not present

## 2018-07-06 DIAGNOSIS — G9341 Metabolic encephalopathy: Secondary | ICD-10-CM | POA: Diagnosis not present

## 2018-07-06 DIAGNOSIS — R278 Other lack of coordination: Secondary | ICD-10-CM | POA: Diagnosis not present

## 2018-07-06 DIAGNOSIS — N184 Chronic kidney disease, stage 4 (severe): Secondary | ICD-10-CM | POA: Diagnosis not present

## 2018-07-06 DIAGNOSIS — M6281 Muscle weakness (generalized): Secondary | ICD-10-CM | POA: Diagnosis not present

## 2018-07-06 DIAGNOSIS — I480 Paroxysmal atrial fibrillation: Secondary | ICD-10-CM | POA: Diagnosis not present

## 2018-07-07 DIAGNOSIS — I480 Paroxysmal atrial fibrillation: Secondary | ICD-10-CM | POA: Diagnosis not present

## 2018-07-07 DIAGNOSIS — R2689 Other abnormalities of gait and mobility: Secondary | ICD-10-CM | POA: Diagnosis not present

## 2018-07-07 DIAGNOSIS — N184 Chronic kidney disease, stage 4 (severe): Secondary | ICD-10-CM | POA: Diagnosis not present

## 2018-07-07 DIAGNOSIS — R278 Other lack of coordination: Secondary | ICD-10-CM | POA: Diagnosis not present

## 2018-07-07 DIAGNOSIS — M6281 Muscle weakness (generalized): Secondary | ICD-10-CM | POA: Diagnosis not present

## 2018-07-07 DIAGNOSIS — G9341 Metabolic encephalopathy: Secondary | ICD-10-CM | POA: Diagnosis not present

## 2018-07-11 DIAGNOSIS — I480 Paroxysmal atrial fibrillation: Secondary | ICD-10-CM | POA: Diagnosis not present

## 2018-07-11 DIAGNOSIS — R278 Other lack of coordination: Secondary | ICD-10-CM | POA: Diagnosis not present

## 2018-07-11 DIAGNOSIS — M6281 Muscle weakness (generalized): Secondary | ICD-10-CM | POA: Diagnosis not present

## 2018-07-11 DIAGNOSIS — G9341 Metabolic encephalopathy: Secondary | ICD-10-CM | POA: Diagnosis not present

## 2018-07-11 DIAGNOSIS — N184 Chronic kidney disease, stage 4 (severe): Secondary | ICD-10-CM | POA: Diagnosis not present

## 2018-07-11 DIAGNOSIS — R2689 Other abnormalities of gait and mobility: Secondary | ICD-10-CM | POA: Diagnosis not present

## 2018-07-11 DIAGNOSIS — R2681 Unsteadiness on feet: Secondary | ICD-10-CM | POA: Diagnosis not present

## 2018-07-12 DIAGNOSIS — N184 Chronic kidney disease, stage 4 (severe): Secondary | ICD-10-CM | POA: Diagnosis not present

## 2018-07-12 DIAGNOSIS — M6281 Muscle weakness (generalized): Secondary | ICD-10-CM | POA: Diagnosis not present

## 2018-07-12 DIAGNOSIS — R2689 Other abnormalities of gait and mobility: Secondary | ICD-10-CM | POA: Diagnosis not present

## 2018-07-12 DIAGNOSIS — G9341 Metabolic encephalopathy: Secondary | ICD-10-CM | POA: Diagnosis not present

## 2018-07-12 DIAGNOSIS — R278 Other lack of coordination: Secondary | ICD-10-CM | POA: Diagnosis not present

## 2018-07-12 DIAGNOSIS — I480 Paroxysmal atrial fibrillation: Secondary | ICD-10-CM | POA: Diagnosis not present

## 2018-07-14 DIAGNOSIS — R2689 Other abnormalities of gait and mobility: Secondary | ICD-10-CM | POA: Diagnosis not present

## 2018-07-14 DIAGNOSIS — I480 Paroxysmal atrial fibrillation: Secondary | ICD-10-CM | POA: Diagnosis not present

## 2018-07-14 DIAGNOSIS — G9341 Metabolic encephalopathy: Secondary | ICD-10-CM | POA: Diagnosis not present

## 2018-07-14 DIAGNOSIS — M6281 Muscle weakness (generalized): Secondary | ICD-10-CM | POA: Diagnosis not present

## 2018-07-14 DIAGNOSIS — N184 Chronic kidney disease, stage 4 (severe): Secondary | ICD-10-CM | POA: Diagnosis not present

## 2018-07-14 DIAGNOSIS — R278 Other lack of coordination: Secondary | ICD-10-CM | POA: Diagnosis not present

## 2018-07-16 DIAGNOSIS — I480 Paroxysmal atrial fibrillation: Secondary | ICD-10-CM | POA: Diagnosis not present

## 2018-07-16 DIAGNOSIS — G6289 Other specified polyneuropathies: Secondary | ICD-10-CM | POA: Diagnosis not present

## 2018-07-16 DIAGNOSIS — N184 Chronic kidney disease, stage 4 (severe): Secondary | ICD-10-CM | POA: Diagnosis not present

## 2018-07-16 DIAGNOSIS — I10 Essential (primary) hypertension: Secondary | ICD-10-CM | POA: Diagnosis not present

## 2018-07-18 DIAGNOSIS — M6281 Muscle weakness (generalized): Secondary | ICD-10-CM | POA: Diagnosis not present

## 2018-07-18 DIAGNOSIS — Z79899 Other long term (current) drug therapy: Secondary | ICD-10-CM | POA: Diagnosis not present

## 2018-07-18 DIAGNOSIS — E08311 Diabetes mellitus due to underlying condition with unspecified diabetic retinopathy with macular edema: Secondary | ICD-10-CM | POA: Diagnosis not present

## 2018-07-18 DIAGNOSIS — R278 Other lack of coordination: Secondary | ICD-10-CM | POA: Diagnosis not present

## 2018-07-18 DIAGNOSIS — I480 Paroxysmal atrial fibrillation: Secondary | ICD-10-CM | POA: Diagnosis not present

## 2018-07-18 DIAGNOSIS — N184 Chronic kidney disease, stage 4 (severe): Secondary | ICD-10-CM | POA: Diagnosis not present

## 2018-07-18 DIAGNOSIS — G9341 Metabolic encephalopathy: Secondary | ICD-10-CM | POA: Diagnosis not present

## 2018-07-18 DIAGNOSIS — R2689 Other abnormalities of gait and mobility: Secondary | ICD-10-CM | POA: Diagnosis not present

## 2018-07-23 DIAGNOSIS — R278 Other lack of coordination: Secondary | ICD-10-CM | POA: Diagnosis not present

## 2018-07-23 DIAGNOSIS — G9341 Metabolic encephalopathy: Secondary | ICD-10-CM | POA: Diagnosis not present

## 2018-07-23 DIAGNOSIS — I480 Paroxysmal atrial fibrillation: Secondary | ICD-10-CM | POA: Diagnosis not present

## 2018-07-23 DIAGNOSIS — N184 Chronic kidney disease, stage 4 (severe): Secondary | ICD-10-CM | POA: Diagnosis not present

## 2018-07-23 DIAGNOSIS — M6281 Muscle weakness (generalized): Secondary | ICD-10-CM | POA: Diagnosis not present

## 2018-07-23 DIAGNOSIS — R2689 Other abnormalities of gait and mobility: Secondary | ICD-10-CM | POA: Diagnosis not present

## 2018-07-24 DIAGNOSIS — G9341 Metabolic encephalopathy: Secondary | ICD-10-CM | POA: Diagnosis not present

## 2018-07-24 DIAGNOSIS — R2689 Other abnormalities of gait and mobility: Secondary | ICD-10-CM | POA: Diagnosis not present

## 2018-07-24 DIAGNOSIS — I480 Paroxysmal atrial fibrillation: Secondary | ICD-10-CM | POA: Diagnosis not present

## 2018-07-24 DIAGNOSIS — M6281 Muscle weakness (generalized): Secondary | ICD-10-CM | POA: Diagnosis not present

## 2018-07-24 DIAGNOSIS — R278 Other lack of coordination: Secondary | ICD-10-CM | POA: Diagnosis not present

## 2018-07-24 DIAGNOSIS — N184 Chronic kidney disease, stage 4 (severe): Secondary | ICD-10-CM | POA: Diagnosis not present

## 2018-07-25 DIAGNOSIS — M6281 Muscle weakness (generalized): Secondary | ICD-10-CM | POA: Diagnosis not present

## 2018-07-25 DIAGNOSIS — I480 Paroxysmal atrial fibrillation: Secondary | ICD-10-CM | POA: Diagnosis not present

## 2018-07-25 DIAGNOSIS — N184 Chronic kidney disease, stage 4 (severe): Secondary | ICD-10-CM | POA: Diagnosis not present

## 2018-07-25 DIAGNOSIS — R2689 Other abnormalities of gait and mobility: Secondary | ICD-10-CM | POA: Diagnosis not present

## 2018-07-25 DIAGNOSIS — R278 Other lack of coordination: Secondary | ICD-10-CM | POA: Diagnosis not present

## 2018-07-25 DIAGNOSIS — G9341 Metabolic encephalopathy: Secondary | ICD-10-CM | POA: Diagnosis not present

## 2018-07-26 DIAGNOSIS — G9341 Metabolic encephalopathy: Secondary | ICD-10-CM | POA: Diagnosis not present

## 2018-07-26 DIAGNOSIS — I480 Paroxysmal atrial fibrillation: Secondary | ICD-10-CM | POA: Diagnosis not present

## 2018-07-26 DIAGNOSIS — N184 Chronic kidney disease, stage 4 (severe): Secondary | ICD-10-CM | POA: Diagnosis not present

## 2018-07-26 DIAGNOSIS — R2689 Other abnormalities of gait and mobility: Secondary | ICD-10-CM | POA: Diagnosis not present

## 2018-07-26 DIAGNOSIS — R278 Other lack of coordination: Secondary | ICD-10-CM | POA: Diagnosis not present

## 2018-07-26 DIAGNOSIS — M6281 Muscle weakness (generalized): Secondary | ICD-10-CM | POA: Diagnosis not present

## 2018-07-27 DIAGNOSIS — N184 Chronic kidney disease, stage 4 (severe): Secondary | ICD-10-CM | POA: Diagnosis not present

## 2018-07-27 DIAGNOSIS — G9341 Metabolic encephalopathy: Secondary | ICD-10-CM | POA: Diagnosis not present

## 2018-07-27 DIAGNOSIS — M6281 Muscle weakness (generalized): Secondary | ICD-10-CM | POA: Diagnosis not present

## 2018-07-27 DIAGNOSIS — I480 Paroxysmal atrial fibrillation: Secondary | ICD-10-CM | POA: Diagnosis not present

## 2018-07-27 DIAGNOSIS — R2689 Other abnormalities of gait and mobility: Secondary | ICD-10-CM | POA: Diagnosis not present

## 2018-07-27 DIAGNOSIS — R278 Other lack of coordination: Secondary | ICD-10-CM | POA: Diagnosis not present

## 2018-07-30 DIAGNOSIS — I480 Paroxysmal atrial fibrillation: Secondary | ICD-10-CM | POA: Diagnosis not present

## 2018-07-30 DIAGNOSIS — N184 Chronic kidney disease, stage 4 (severe): Secondary | ICD-10-CM | POA: Diagnosis not present

## 2018-07-30 DIAGNOSIS — R2689 Other abnormalities of gait and mobility: Secondary | ICD-10-CM | POA: Diagnosis not present

## 2018-07-30 DIAGNOSIS — R278 Other lack of coordination: Secondary | ICD-10-CM | POA: Diagnosis not present

## 2018-07-30 DIAGNOSIS — M6281 Muscle weakness (generalized): Secondary | ICD-10-CM | POA: Diagnosis not present

## 2018-07-30 DIAGNOSIS — G9341 Metabolic encephalopathy: Secondary | ICD-10-CM | POA: Diagnosis not present

## 2018-08-16 DIAGNOSIS — I480 Paroxysmal atrial fibrillation: Secondary | ICD-10-CM | POA: Diagnosis not present

## 2018-08-16 DIAGNOSIS — R2681 Unsteadiness on feet: Secondary | ICD-10-CM | POA: Diagnosis not present

## 2018-08-16 DIAGNOSIS — R2689 Other abnormalities of gait and mobility: Secondary | ICD-10-CM | POA: Diagnosis not present

## 2018-08-16 DIAGNOSIS — M6281 Muscle weakness (generalized): Secondary | ICD-10-CM | POA: Diagnosis not present

## 2018-08-16 DIAGNOSIS — G9341 Metabolic encephalopathy: Secondary | ICD-10-CM | POA: Diagnosis not present

## 2018-08-16 DIAGNOSIS — R278 Other lack of coordination: Secondary | ICD-10-CM | POA: Diagnosis not present

## 2018-08-16 DIAGNOSIS — N184 Chronic kidney disease, stage 4 (severe): Secondary | ICD-10-CM | POA: Diagnosis not present

## 2018-08-17 DIAGNOSIS — G9341 Metabolic encephalopathy: Secondary | ICD-10-CM | POA: Diagnosis not present

## 2018-08-17 DIAGNOSIS — R278 Other lack of coordination: Secondary | ICD-10-CM | POA: Diagnosis not present

## 2018-08-17 DIAGNOSIS — I480 Paroxysmal atrial fibrillation: Secondary | ICD-10-CM | POA: Diagnosis not present

## 2018-08-17 DIAGNOSIS — N184 Chronic kidney disease, stage 4 (severe): Secondary | ICD-10-CM | POA: Diagnosis not present

## 2018-08-17 DIAGNOSIS — M6281 Muscle weakness (generalized): Secondary | ICD-10-CM | POA: Diagnosis not present

## 2018-08-17 DIAGNOSIS — R2689 Other abnormalities of gait and mobility: Secondary | ICD-10-CM | POA: Diagnosis not present

## 2018-08-20 DIAGNOSIS — G9341 Metabolic encephalopathy: Secondary | ICD-10-CM | POA: Diagnosis not present

## 2018-08-20 DIAGNOSIS — R2689 Other abnormalities of gait and mobility: Secondary | ICD-10-CM | POA: Diagnosis not present

## 2018-08-20 DIAGNOSIS — I480 Paroxysmal atrial fibrillation: Secondary | ICD-10-CM | POA: Diagnosis not present

## 2018-08-20 DIAGNOSIS — N184 Chronic kidney disease, stage 4 (severe): Secondary | ICD-10-CM | POA: Diagnosis not present

## 2018-08-20 DIAGNOSIS — M6281 Muscle weakness (generalized): Secondary | ICD-10-CM | POA: Diagnosis not present

## 2018-08-20 DIAGNOSIS — R278 Other lack of coordination: Secondary | ICD-10-CM | POA: Diagnosis not present

## 2018-08-21 DIAGNOSIS — G9341 Metabolic encephalopathy: Secondary | ICD-10-CM | POA: Diagnosis not present

## 2018-08-21 DIAGNOSIS — R2689 Other abnormalities of gait and mobility: Secondary | ICD-10-CM | POA: Diagnosis not present

## 2018-08-21 DIAGNOSIS — I480 Paroxysmal atrial fibrillation: Secondary | ICD-10-CM | POA: Diagnosis not present

## 2018-08-21 DIAGNOSIS — R278 Other lack of coordination: Secondary | ICD-10-CM | POA: Diagnosis not present

## 2018-08-21 DIAGNOSIS — N184 Chronic kidney disease, stage 4 (severe): Secondary | ICD-10-CM | POA: Diagnosis not present

## 2018-08-21 DIAGNOSIS — M6281 Muscle weakness (generalized): Secondary | ICD-10-CM | POA: Diagnosis not present

## 2018-08-22 DIAGNOSIS — R2689 Other abnormalities of gait and mobility: Secondary | ICD-10-CM | POA: Diagnosis not present

## 2018-08-22 DIAGNOSIS — R278 Other lack of coordination: Secondary | ICD-10-CM | POA: Diagnosis not present

## 2018-08-22 DIAGNOSIS — G9341 Metabolic encephalopathy: Secondary | ICD-10-CM | POA: Diagnosis not present

## 2018-08-22 DIAGNOSIS — M6281 Muscle weakness (generalized): Secondary | ICD-10-CM | POA: Diagnosis not present

## 2018-08-22 DIAGNOSIS — N184 Chronic kidney disease, stage 4 (severe): Secondary | ICD-10-CM | POA: Diagnosis not present

## 2018-08-22 DIAGNOSIS — I480 Paroxysmal atrial fibrillation: Secondary | ICD-10-CM | POA: Diagnosis not present

## 2018-08-23 DIAGNOSIS — R2689 Other abnormalities of gait and mobility: Secondary | ICD-10-CM | POA: Diagnosis not present

## 2018-08-23 DIAGNOSIS — G9341 Metabolic encephalopathy: Secondary | ICD-10-CM | POA: Diagnosis not present

## 2018-08-23 DIAGNOSIS — I480 Paroxysmal atrial fibrillation: Secondary | ICD-10-CM | POA: Diagnosis not present

## 2018-08-23 DIAGNOSIS — N184 Chronic kidney disease, stage 4 (severe): Secondary | ICD-10-CM | POA: Diagnosis not present

## 2018-08-23 DIAGNOSIS — R278 Other lack of coordination: Secondary | ICD-10-CM | POA: Diagnosis not present

## 2018-08-23 DIAGNOSIS — M6281 Muscle weakness (generalized): Secondary | ICD-10-CM | POA: Diagnosis not present

## 2018-08-24 DIAGNOSIS — R2689 Other abnormalities of gait and mobility: Secondary | ICD-10-CM | POA: Diagnosis not present

## 2018-08-24 DIAGNOSIS — N184 Chronic kidney disease, stage 4 (severe): Secondary | ICD-10-CM | POA: Diagnosis not present

## 2018-08-24 DIAGNOSIS — M6281 Muscle weakness (generalized): Secondary | ICD-10-CM | POA: Diagnosis not present

## 2018-08-24 DIAGNOSIS — G9341 Metabolic encephalopathy: Secondary | ICD-10-CM | POA: Diagnosis not present

## 2018-08-24 DIAGNOSIS — R278 Other lack of coordination: Secondary | ICD-10-CM | POA: Diagnosis not present

## 2018-08-24 DIAGNOSIS — I480 Paroxysmal atrial fibrillation: Secondary | ICD-10-CM | POA: Diagnosis not present

## 2018-08-27 DIAGNOSIS — N184 Chronic kidney disease, stage 4 (severe): Secondary | ICD-10-CM | POA: Diagnosis not present

## 2018-08-27 DIAGNOSIS — R2689 Other abnormalities of gait and mobility: Secondary | ICD-10-CM | POA: Diagnosis not present

## 2018-08-27 DIAGNOSIS — M6281 Muscle weakness (generalized): Secondary | ICD-10-CM | POA: Diagnosis not present

## 2018-08-27 DIAGNOSIS — I480 Paroxysmal atrial fibrillation: Secondary | ICD-10-CM | POA: Diagnosis not present

## 2018-08-27 DIAGNOSIS — R278 Other lack of coordination: Secondary | ICD-10-CM | POA: Diagnosis not present

## 2018-08-27 DIAGNOSIS — G9341 Metabolic encephalopathy: Secondary | ICD-10-CM | POA: Diagnosis not present

## 2018-08-28 DIAGNOSIS — I480 Paroxysmal atrial fibrillation: Secondary | ICD-10-CM | POA: Diagnosis not present

## 2018-08-28 DIAGNOSIS — R278 Other lack of coordination: Secondary | ICD-10-CM | POA: Diagnosis not present

## 2018-08-28 DIAGNOSIS — G9341 Metabolic encephalopathy: Secondary | ICD-10-CM | POA: Diagnosis not present

## 2018-08-28 DIAGNOSIS — R2689 Other abnormalities of gait and mobility: Secondary | ICD-10-CM | POA: Diagnosis not present

## 2018-08-28 DIAGNOSIS — N184 Chronic kidney disease, stage 4 (severe): Secondary | ICD-10-CM | POA: Diagnosis not present

## 2018-08-28 DIAGNOSIS — M6281 Muscle weakness (generalized): Secondary | ICD-10-CM | POA: Diagnosis not present

## 2018-08-29 DIAGNOSIS — R278 Other lack of coordination: Secondary | ICD-10-CM | POA: Diagnosis not present

## 2018-08-29 DIAGNOSIS — R2689 Other abnormalities of gait and mobility: Secondary | ICD-10-CM | POA: Diagnosis not present

## 2018-08-29 DIAGNOSIS — M6281 Muscle weakness (generalized): Secondary | ICD-10-CM | POA: Diagnosis not present

## 2018-08-29 DIAGNOSIS — G9341 Metabolic encephalopathy: Secondary | ICD-10-CM | POA: Diagnosis not present

## 2018-08-29 DIAGNOSIS — N184 Chronic kidney disease, stage 4 (severe): Secondary | ICD-10-CM | POA: Diagnosis not present

## 2018-08-29 DIAGNOSIS — I480 Paroxysmal atrial fibrillation: Secondary | ICD-10-CM | POA: Diagnosis not present

## 2018-08-30 DIAGNOSIS — M6281 Muscle weakness (generalized): Secondary | ICD-10-CM | POA: Diagnosis not present

## 2018-08-30 DIAGNOSIS — R278 Other lack of coordination: Secondary | ICD-10-CM | POA: Diagnosis not present

## 2018-08-30 DIAGNOSIS — N184 Chronic kidney disease, stage 4 (severe): Secondary | ICD-10-CM | POA: Diagnosis not present

## 2018-08-30 DIAGNOSIS — R2689 Other abnormalities of gait and mobility: Secondary | ICD-10-CM | POA: Diagnosis not present

## 2018-08-30 DIAGNOSIS — I480 Paroxysmal atrial fibrillation: Secondary | ICD-10-CM | POA: Diagnosis not present

## 2018-08-30 DIAGNOSIS — G9341 Metabolic encephalopathy: Secondary | ICD-10-CM | POA: Diagnosis not present

## 2018-08-31 DIAGNOSIS — M6281 Muscle weakness (generalized): Secondary | ICD-10-CM | POA: Diagnosis not present

## 2018-08-31 DIAGNOSIS — G9341 Metabolic encephalopathy: Secondary | ICD-10-CM | POA: Diagnosis not present

## 2018-08-31 DIAGNOSIS — R278 Other lack of coordination: Secondary | ICD-10-CM | POA: Diagnosis not present

## 2018-08-31 DIAGNOSIS — I480 Paroxysmal atrial fibrillation: Secondary | ICD-10-CM | POA: Diagnosis not present

## 2018-08-31 DIAGNOSIS — N184 Chronic kidney disease, stage 4 (severe): Secondary | ICD-10-CM | POA: Diagnosis not present

## 2018-08-31 DIAGNOSIS — R2689 Other abnormalities of gait and mobility: Secondary | ICD-10-CM | POA: Diagnosis not present

## 2018-09-03 DIAGNOSIS — G9341 Metabolic encephalopathy: Secondary | ICD-10-CM | POA: Diagnosis not present

## 2018-09-03 DIAGNOSIS — R2689 Other abnormalities of gait and mobility: Secondary | ICD-10-CM | POA: Diagnosis not present

## 2018-09-03 DIAGNOSIS — N184 Chronic kidney disease, stage 4 (severe): Secondary | ICD-10-CM | POA: Diagnosis not present

## 2018-09-03 DIAGNOSIS — R278 Other lack of coordination: Secondary | ICD-10-CM | POA: Diagnosis not present

## 2018-09-03 DIAGNOSIS — I480 Paroxysmal atrial fibrillation: Secondary | ICD-10-CM | POA: Diagnosis not present

## 2018-09-03 DIAGNOSIS — M6281 Muscle weakness (generalized): Secondary | ICD-10-CM | POA: Diagnosis not present

## 2018-09-04 DIAGNOSIS — J984 Other disorders of lung: Secondary | ICD-10-CM | POA: Diagnosis not present

## 2018-09-04 DIAGNOSIS — E0841 Diabetes mellitus due to underlying condition with diabetic mononeuropathy: Secondary | ICD-10-CM | POA: Diagnosis not present

## 2018-09-04 DIAGNOSIS — I2510 Atherosclerotic heart disease of native coronary artery without angina pectoris: Secondary | ICD-10-CM | POA: Diagnosis not present

## 2018-09-04 DIAGNOSIS — M6281 Muscle weakness (generalized): Secondary | ICD-10-CM | POA: Diagnosis not present

## 2018-09-04 DIAGNOSIS — I480 Paroxysmal atrial fibrillation: Secondary | ICD-10-CM | POA: Diagnosis not present

## 2018-09-04 DIAGNOSIS — L738 Other specified follicular disorders: Secondary | ICD-10-CM | POA: Diagnosis not present

## 2018-09-04 DIAGNOSIS — G4733 Obstructive sleep apnea (adult) (pediatric): Secondary | ICD-10-CM | POA: Diagnosis not present

## 2018-09-04 DIAGNOSIS — G9341 Metabolic encephalopathy: Secondary | ICD-10-CM | POA: Diagnosis not present

## 2018-09-04 DIAGNOSIS — N184 Chronic kidney disease, stage 4 (severe): Secondary | ICD-10-CM | POA: Diagnosis not present

## 2018-09-04 DIAGNOSIS — R278 Other lack of coordination: Secondary | ICD-10-CM | POA: Diagnosis not present

## 2018-09-04 DIAGNOSIS — R2689 Other abnormalities of gait and mobility: Secondary | ICD-10-CM | POA: Diagnosis not present

## 2018-09-04 DIAGNOSIS — I129 Hypertensive chronic kidney disease with stage 1 through stage 4 chronic kidney disease, or unspecified chronic kidney disease: Secondary | ICD-10-CM | POA: Diagnosis not present

## 2018-09-04 DIAGNOSIS — I7389 Other specified peripheral vascular diseases: Secondary | ICD-10-CM | POA: Diagnosis not present

## 2018-09-05 DIAGNOSIS — N184 Chronic kidney disease, stage 4 (severe): Secondary | ICD-10-CM | POA: Diagnosis not present

## 2018-09-05 DIAGNOSIS — R2689 Other abnormalities of gait and mobility: Secondary | ICD-10-CM | POA: Diagnosis not present

## 2018-09-05 DIAGNOSIS — G9341 Metabolic encephalopathy: Secondary | ICD-10-CM | POA: Diagnosis not present

## 2018-09-05 DIAGNOSIS — M6281 Muscle weakness (generalized): Secondary | ICD-10-CM | POA: Diagnosis not present

## 2018-09-05 DIAGNOSIS — I480 Paroxysmal atrial fibrillation: Secondary | ICD-10-CM | POA: Diagnosis not present

## 2018-09-05 DIAGNOSIS — R278 Other lack of coordination: Secondary | ICD-10-CM | POA: Diagnosis not present

## 2018-09-06 DIAGNOSIS — R2689 Other abnormalities of gait and mobility: Secondary | ICD-10-CM | POA: Diagnosis not present

## 2018-09-06 DIAGNOSIS — R278 Other lack of coordination: Secondary | ICD-10-CM | POA: Diagnosis not present

## 2018-09-06 DIAGNOSIS — G9341 Metabolic encephalopathy: Secondary | ICD-10-CM | POA: Diagnosis not present

## 2018-09-06 DIAGNOSIS — M6281 Muscle weakness (generalized): Secondary | ICD-10-CM | POA: Diagnosis not present

## 2018-09-06 DIAGNOSIS — N184 Chronic kidney disease, stage 4 (severe): Secondary | ICD-10-CM | POA: Diagnosis not present

## 2018-09-06 DIAGNOSIS — I480 Paroxysmal atrial fibrillation: Secondary | ICD-10-CM | POA: Diagnosis not present

## 2018-09-07 DIAGNOSIS — M6281 Muscle weakness (generalized): Secondary | ICD-10-CM | POA: Diagnosis not present

## 2018-09-07 DIAGNOSIS — R2689 Other abnormalities of gait and mobility: Secondary | ICD-10-CM | POA: Diagnosis not present

## 2018-09-07 DIAGNOSIS — R278 Other lack of coordination: Secondary | ICD-10-CM | POA: Diagnosis not present

## 2018-09-07 DIAGNOSIS — G9341 Metabolic encephalopathy: Secondary | ICD-10-CM | POA: Diagnosis not present

## 2018-09-07 DIAGNOSIS — I480 Paroxysmal atrial fibrillation: Secondary | ICD-10-CM | POA: Diagnosis not present

## 2018-09-07 DIAGNOSIS — N184 Chronic kidney disease, stage 4 (severe): Secondary | ICD-10-CM | POA: Diagnosis not present

## 2018-09-10 DIAGNOSIS — R2681 Unsteadiness on feet: Secondary | ICD-10-CM | POA: Diagnosis not present

## 2018-09-10 DIAGNOSIS — M6281 Muscle weakness (generalized): Secondary | ICD-10-CM | POA: Diagnosis not present

## 2018-09-10 DIAGNOSIS — R278 Other lack of coordination: Secondary | ICD-10-CM | POA: Diagnosis not present

## 2018-09-10 DIAGNOSIS — G9341 Metabolic encephalopathy: Secondary | ICD-10-CM | POA: Diagnosis not present

## 2018-09-10 DIAGNOSIS — N184 Chronic kidney disease, stage 4 (severe): Secondary | ICD-10-CM | POA: Diagnosis not present

## 2018-09-10 DIAGNOSIS — I480 Paroxysmal atrial fibrillation: Secondary | ICD-10-CM | POA: Diagnosis not present

## 2018-09-10 DIAGNOSIS — R2689 Other abnormalities of gait and mobility: Secondary | ICD-10-CM | POA: Diagnosis not present

## 2018-09-11 DIAGNOSIS — G9341 Metabolic encephalopathy: Secondary | ICD-10-CM | POA: Diagnosis not present

## 2018-09-11 DIAGNOSIS — N184 Chronic kidney disease, stage 4 (severe): Secondary | ICD-10-CM | POA: Diagnosis not present

## 2018-09-11 DIAGNOSIS — R278 Other lack of coordination: Secondary | ICD-10-CM | POA: Diagnosis not present

## 2018-09-11 DIAGNOSIS — R2689 Other abnormalities of gait and mobility: Secondary | ICD-10-CM | POA: Diagnosis not present

## 2018-09-11 DIAGNOSIS — I480 Paroxysmal atrial fibrillation: Secondary | ICD-10-CM | POA: Diagnosis not present

## 2018-09-11 DIAGNOSIS — M6281 Muscle weakness (generalized): Secondary | ICD-10-CM | POA: Diagnosis not present

## 2018-09-12 DIAGNOSIS — I480 Paroxysmal atrial fibrillation: Secondary | ICD-10-CM | POA: Diagnosis not present

## 2018-09-12 DIAGNOSIS — R5381 Other malaise: Secondary | ICD-10-CM | POA: Diagnosis not present

## 2018-09-12 DIAGNOSIS — R2689 Other abnormalities of gait and mobility: Secondary | ICD-10-CM | POA: Diagnosis not present

## 2018-09-12 DIAGNOSIS — M6281 Muscle weakness (generalized): Secondary | ICD-10-CM | POA: Diagnosis not present

## 2018-09-12 DIAGNOSIS — N184 Chronic kidney disease, stage 4 (severe): Secondary | ICD-10-CM | POA: Diagnosis not present

## 2018-09-12 DIAGNOSIS — R278 Other lack of coordination: Secondary | ICD-10-CM | POA: Diagnosis not present

## 2018-09-12 DIAGNOSIS — R600 Localized edema: Secondary | ICD-10-CM | POA: Diagnosis not present

## 2018-09-12 DIAGNOSIS — J984 Other disorders of lung: Secondary | ICD-10-CM | POA: Diagnosis not present

## 2018-09-12 DIAGNOSIS — I129 Hypertensive chronic kidney disease with stage 1 through stage 4 chronic kidney disease, or unspecified chronic kidney disease: Secondary | ICD-10-CM | POA: Diagnosis not present

## 2018-09-12 DIAGNOSIS — G9341 Metabolic encephalopathy: Secondary | ICD-10-CM | POA: Diagnosis not present

## 2018-09-13 DIAGNOSIS — N184 Chronic kidney disease, stage 4 (severe): Secondary | ICD-10-CM | POA: Diagnosis not present

## 2018-09-13 DIAGNOSIS — R278 Other lack of coordination: Secondary | ICD-10-CM | POA: Diagnosis not present

## 2018-09-13 DIAGNOSIS — R2689 Other abnormalities of gait and mobility: Secondary | ICD-10-CM | POA: Diagnosis not present

## 2018-09-13 DIAGNOSIS — G9341 Metabolic encephalopathy: Secondary | ICD-10-CM | POA: Diagnosis not present

## 2018-09-13 DIAGNOSIS — M6281 Muscle weakness (generalized): Secondary | ICD-10-CM | POA: Diagnosis not present

## 2018-09-13 DIAGNOSIS — I480 Paroxysmal atrial fibrillation: Secondary | ICD-10-CM | POA: Diagnosis not present

## 2018-09-14 DIAGNOSIS — R0989 Other specified symptoms and signs involving the circulatory and respiratory systems: Secondary | ICD-10-CM | POA: Diagnosis not present

## 2018-09-14 DIAGNOSIS — I480 Paroxysmal atrial fibrillation: Secondary | ICD-10-CM | POA: Diagnosis not present

## 2018-09-14 DIAGNOSIS — R2689 Other abnormalities of gait and mobility: Secondary | ICD-10-CM | POA: Diagnosis not present

## 2018-09-14 DIAGNOSIS — J984 Other disorders of lung: Secondary | ICD-10-CM | POA: Diagnosis not present

## 2018-09-14 DIAGNOSIS — I129 Hypertensive chronic kidney disease with stage 1 through stage 4 chronic kidney disease, or unspecified chronic kidney disease: Secondary | ICD-10-CM | POA: Diagnosis not present

## 2018-09-14 DIAGNOSIS — R278 Other lack of coordination: Secondary | ICD-10-CM | POA: Diagnosis not present

## 2018-09-14 DIAGNOSIS — M6281 Muscle weakness (generalized): Secondary | ICD-10-CM | POA: Diagnosis not present

## 2018-09-14 DIAGNOSIS — N184 Chronic kidney disease, stage 4 (severe): Secondary | ICD-10-CM | POA: Diagnosis not present

## 2018-09-14 DIAGNOSIS — G9341 Metabolic encephalopathy: Secondary | ICD-10-CM | POA: Diagnosis not present

## 2018-09-14 DIAGNOSIS — R600 Localized edema: Secondary | ICD-10-CM | POA: Diagnosis not present

## 2018-09-17 DIAGNOSIS — N184 Chronic kidney disease, stage 4 (severe): Secondary | ICD-10-CM | POA: Diagnosis not present

## 2018-09-17 DIAGNOSIS — M6281 Muscle weakness (generalized): Secondary | ICD-10-CM | POA: Diagnosis not present

## 2018-09-17 DIAGNOSIS — R278 Other lack of coordination: Secondary | ICD-10-CM | POA: Diagnosis not present

## 2018-09-17 DIAGNOSIS — Z79899 Other long term (current) drug therapy: Secondary | ICD-10-CM | POA: Diagnosis not present

## 2018-09-17 DIAGNOSIS — I480 Paroxysmal atrial fibrillation: Secondary | ICD-10-CM | POA: Diagnosis not present

## 2018-09-17 DIAGNOSIS — R600 Localized edema: Secondary | ICD-10-CM | POA: Diagnosis not present

## 2018-09-17 DIAGNOSIS — R2689 Other abnormalities of gait and mobility: Secondary | ICD-10-CM | POA: Diagnosis not present

## 2018-09-17 DIAGNOSIS — G9341 Metabolic encephalopathy: Secondary | ICD-10-CM | POA: Diagnosis not present

## 2018-09-18 DIAGNOSIS — E876 Hypokalemia: Secondary | ICD-10-CM | POA: Diagnosis not present

## 2018-09-18 DIAGNOSIS — I480 Paroxysmal atrial fibrillation: Secondary | ICD-10-CM | POA: Diagnosis not present

## 2018-09-18 DIAGNOSIS — G9341 Metabolic encephalopathy: Secondary | ICD-10-CM | POA: Diagnosis not present

## 2018-09-18 DIAGNOSIS — R2689 Other abnormalities of gait and mobility: Secondary | ICD-10-CM | POA: Diagnosis not present

## 2018-09-18 DIAGNOSIS — R600 Localized edema: Secondary | ICD-10-CM | POA: Diagnosis not present

## 2018-09-18 DIAGNOSIS — N184 Chronic kidney disease, stage 4 (severe): Secondary | ICD-10-CM | POA: Diagnosis not present

## 2018-09-18 DIAGNOSIS — M6281 Muscle weakness (generalized): Secondary | ICD-10-CM | POA: Diagnosis not present

## 2018-09-18 DIAGNOSIS — R278 Other lack of coordination: Secondary | ICD-10-CM | POA: Diagnosis not present

## 2018-09-18 DIAGNOSIS — Z79899 Other long term (current) drug therapy: Secondary | ICD-10-CM | POA: Diagnosis not present

## 2018-09-19 DIAGNOSIS — G9341 Metabolic encephalopathy: Secondary | ICD-10-CM | POA: Diagnosis not present

## 2018-09-19 DIAGNOSIS — M6281 Muscle weakness (generalized): Secondary | ICD-10-CM | POA: Diagnosis not present

## 2018-09-19 DIAGNOSIS — I480 Paroxysmal atrial fibrillation: Secondary | ICD-10-CM | POA: Diagnosis not present

## 2018-09-19 DIAGNOSIS — R278 Other lack of coordination: Secondary | ICD-10-CM | POA: Diagnosis not present

## 2018-09-19 DIAGNOSIS — R2689 Other abnormalities of gait and mobility: Secondary | ICD-10-CM | POA: Diagnosis not present

## 2018-09-19 DIAGNOSIS — N184 Chronic kidney disease, stage 4 (severe): Secondary | ICD-10-CM | POA: Diagnosis not present

## 2018-09-20 DIAGNOSIS — N184 Chronic kidney disease, stage 4 (severe): Secondary | ICD-10-CM | POA: Diagnosis not present

## 2018-09-20 DIAGNOSIS — R2689 Other abnormalities of gait and mobility: Secondary | ICD-10-CM | POA: Diagnosis not present

## 2018-09-20 DIAGNOSIS — R278 Other lack of coordination: Secondary | ICD-10-CM | POA: Diagnosis not present

## 2018-09-20 DIAGNOSIS — I480 Paroxysmal atrial fibrillation: Secondary | ICD-10-CM | POA: Diagnosis not present

## 2018-09-20 DIAGNOSIS — G9341 Metabolic encephalopathy: Secondary | ICD-10-CM | POA: Diagnosis not present

## 2018-09-20 DIAGNOSIS — M6281 Muscle weakness (generalized): Secondary | ICD-10-CM | POA: Diagnosis not present

## 2018-09-21 DIAGNOSIS — M6281 Muscle weakness (generalized): Secondary | ICD-10-CM | POA: Diagnosis not present

## 2018-09-21 DIAGNOSIS — R2689 Other abnormalities of gait and mobility: Secondary | ICD-10-CM | POA: Diagnosis not present

## 2018-09-21 DIAGNOSIS — R3129 Other microscopic hematuria: Secondary | ICD-10-CM | POA: Diagnosis not present

## 2018-09-21 DIAGNOSIS — R278 Other lack of coordination: Secondary | ICD-10-CM | POA: Diagnosis not present

## 2018-09-21 DIAGNOSIS — G9341 Metabolic encephalopathy: Secondary | ICD-10-CM | POA: Diagnosis not present

## 2018-09-21 DIAGNOSIS — Z79899 Other long term (current) drug therapy: Secondary | ICD-10-CM | POA: Diagnosis not present

## 2018-09-21 DIAGNOSIS — I480 Paroxysmal atrial fibrillation: Secondary | ICD-10-CM | POA: Diagnosis not present

## 2018-09-21 DIAGNOSIS — N184 Chronic kidney disease, stage 4 (severe): Secondary | ICD-10-CM | POA: Diagnosis not present

## 2018-09-21 DIAGNOSIS — E876 Hypokalemia: Secondary | ICD-10-CM | POA: Diagnosis not present

## 2018-09-22 DIAGNOSIS — R278 Other lack of coordination: Secondary | ICD-10-CM | POA: Diagnosis not present

## 2018-09-22 DIAGNOSIS — I480 Paroxysmal atrial fibrillation: Secondary | ICD-10-CM | POA: Diagnosis not present

## 2018-09-22 DIAGNOSIS — G9341 Metabolic encephalopathy: Secondary | ICD-10-CM | POA: Diagnosis not present

## 2018-09-22 DIAGNOSIS — N390 Urinary tract infection, site not specified: Secondary | ICD-10-CM | POA: Diagnosis not present

## 2018-09-22 DIAGNOSIS — R2689 Other abnormalities of gait and mobility: Secondary | ICD-10-CM | POA: Diagnosis not present

## 2018-09-22 DIAGNOSIS — N184 Chronic kidney disease, stage 4 (severe): Secondary | ICD-10-CM | POA: Diagnosis not present

## 2018-09-22 DIAGNOSIS — M6281 Muscle weakness (generalized): Secondary | ICD-10-CM | POA: Diagnosis not present

## 2018-09-24 DIAGNOSIS — M6281 Muscle weakness (generalized): Secondary | ICD-10-CM | POA: Diagnosis not present

## 2018-09-24 DIAGNOSIS — Z79899 Other long term (current) drug therapy: Secondary | ICD-10-CM | POA: Diagnosis not present

## 2018-09-24 DIAGNOSIS — R2689 Other abnormalities of gait and mobility: Secondary | ICD-10-CM | POA: Diagnosis not present

## 2018-09-24 DIAGNOSIS — R278 Other lack of coordination: Secondary | ICD-10-CM | POA: Diagnosis not present

## 2018-09-24 DIAGNOSIS — G9341 Metabolic encephalopathy: Secondary | ICD-10-CM | POA: Diagnosis not present

## 2018-09-24 DIAGNOSIS — N184 Chronic kidney disease, stage 4 (severe): Secondary | ICD-10-CM | POA: Diagnosis not present

## 2018-09-24 DIAGNOSIS — I480 Paroxysmal atrial fibrillation: Secondary | ICD-10-CM | POA: Diagnosis not present

## 2018-09-25 DIAGNOSIS — G9341 Metabolic encephalopathy: Secondary | ICD-10-CM | POA: Diagnosis not present

## 2018-09-25 DIAGNOSIS — N184 Chronic kidney disease, stage 4 (severe): Secondary | ICD-10-CM | POA: Diagnosis not present

## 2018-09-25 DIAGNOSIS — R278 Other lack of coordination: Secondary | ICD-10-CM | POA: Diagnosis not present

## 2018-09-25 DIAGNOSIS — R2689 Other abnormalities of gait and mobility: Secondary | ICD-10-CM | POA: Diagnosis not present

## 2018-09-25 DIAGNOSIS — M6281 Muscle weakness (generalized): Secondary | ICD-10-CM | POA: Diagnosis not present

## 2018-09-25 DIAGNOSIS — I480 Paroxysmal atrial fibrillation: Secondary | ICD-10-CM | POA: Diagnosis not present

## 2018-09-26 DIAGNOSIS — N184 Chronic kidney disease, stage 4 (severe): Secondary | ICD-10-CM | POA: Diagnosis not present

## 2018-09-26 DIAGNOSIS — R300 Dysuria: Secondary | ICD-10-CM | POA: Diagnosis not present

## 2018-09-26 DIAGNOSIS — Z79899 Other long term (current) drug therapy: Secondary | ICD-10-CM | POA: Diagnosis not present

## 2018-09-26 DIAGNOSIS — E876 Hypokalemia: Secondary | ICD-10-CM | POA: Diagnosis not present

## 2018-09-27 DIAGNOSIS — N184 Chronic kidney disease, stage 4 (severe): Secondary | ICD-10-CM | POA: Diagnosis not present

## 2018-09-27 DIAGNOSIS — M6281 Muscle weakness (generalized): Secondary | ICD-10-CM | POA: Diagnosis not present

## 2018-09-27 DIAGNOSIS — I480 Paroxysmal atrial fibrillation: Secondary | ICD-10-CM | POA: Diagnosis not present

## 2018-09-27 DIAGNOSIS — G9341 Metabolic encephalopathy: Secondary | ICD-10-CM | POA: Diagnosis not present

## 2018-09-27 DIAGNOSIS — R278 Other lack of coordination: Secondary | ICD-10-CM | POA: Diagnosis not present

## 2018-09-27 DIAGNOSIS — R2689 Other abnormalities of gait and mobility: Secondary | ICD-10-CM | POA: Diagnosis not present

## 2018-09-28 DIAGNOSIS — R2689 Other abnormalities of gait and mobility: Secondary | ICD-10-CM | POA: Diagnosis not present

## 2018-09-28 DIAGNOSIS — G9341 Metabolic encephalopathy: Secondary | ICD-10-CM | POA: Diagnosis not present

## 2018-09-28 DIAGNOSIS — I480 Paroxysmal atrial fibrillation: Secondary | ICD-10-CM | POA: Diagnosis not present

## 2018-09-28 DIAGNOSIS — N184 Chronic kidney disease, stage 4 (severe): Secondary | ICD-10-CM | POA: Diagnosis not present

## 2018-09-28 DIAGNOSIS — R278 Other lack of coordination: Secondary | ICD-10-CM | POA: Diagnosis not present

## 2018-09-28 DIAGNOSIS — M6281 Muscle weakness (generalized): Secondary | ICD-10-CM | POA: Diagnosis not present

## 2018-10-01 DIAGNOSIS — G9341 Metabolic encephalopathy: Secondary | ICD-10-CM | POA: Diagnosis not present

## 2018-10-01 DIAGNOSIS — I480 Paroxysmal atrial fibrillation: Secondary | ICD-10-CM | POA: Diagnosis not present

## 2018-10-01 DIAGNOSIS — Z79899 Other long term (current) drug therapy: Secondary | ICD-10-CM | POA: Diagnosis not present

## 2018-10-01 DIAGNOSIS — M6281 Muscle weakness (generalized): Secondary | ICD-10-CM | POA: Diagnosis not present

## 2018-10-01 DIAGNOSIS — R278 Other lack of coordination: Secondary | ICD-10-CM | POA: Diagnosis not present

## 2018-10-01 DIAGNOSIS — N184 Chronic kidney disease, stage 4 (severe): Secondary | ICD-10-CM | POA: Diagnosis not present

## 2018-10-01 DIAGNOSIS — R2689 Other abnormalities of gait and mobility: Secondary | ICD-10-CM | POA: Diagnosis not present

## 2018-10-02 DIAGNOSIS — I480 Paroxysmal atrial fibrillation: Secondary | ICD-10-CM | POA: Diagnosis not present

## 2018-10-02 DIAGNOSIS — R2689 Other abnormalities of gait and mobility: Secondary | ICD-10-CM | POA: Diagnosis not present

## 2018-10-02 DIAGNOSIS — R278 Other lack of coordination: Secondary | ICD-10-CM | POA: Diagnosis not present

## 2018-10-02 DIAGNOSIS — G9341 Metabolic encephalopathy: Secondary | ICD-10-CM | POA: Diagnosis not present

## 2018-10-02 DIAGNOSIS — N184 Chronic kidney disease, stage 4 (severe): Secondary | ICD-10-CM | POA: Diagnosis not present

## 2018-10-02 DIAGNOSIS — M6281 Muscle weakness (generalized): Secondary | ICD-10-CM | POA: Diagnosis not present

## 2018-10-03 DIAGNOSIS — R2689 Other abnormalities of gait and mobility: Secondary | ICD-10-CM | POA: Diagnosis not present

## 2018-10-03 DIAGNOSIS — M6281 Muscle weakness (generalized): Secondary | ICD-10-CM | POA: Diagnosis not present

## 2018-10-03 DIAGNOSIS — I480 Paroxysmal atrial fibrillation: Secondary | ICD-10-CM | POA: Diagnosis not present

## 2018-10-03 DIAGNOSIS — R278 Other lack of coordination: Secondary | ICD-10-CM | POA: Diagnosis not present

## 2018-10-03 DIAGNOSIS — N184 Chronic kidney disease, stage 4 (severe): Secondary | ICD-10-CM | POA: Diagnosis not present

## 2018-10-03 DIAGNOSIS — G9341 Metabolic encephalopathy: Secondary | ICD-10-CM | POA: Diagnosis not present

## 2018-10-04 DIAGNOSIS — R2689 Other abnormalities of gait and mobility: Secondary | ICD-10-CM | POA: Diagnosis not present

## 2018-10-04 DIAGNOSIS — R278 Other lack of coordination: Secondary | ICD-10-CM | POA: Diagnosis not present

## 2018-10-04 DIAGNOSIS — G9341 Metabolic encephalopathy: Secondary | ICD-10-CM | POA: Diagnosis not present

## 2018-10-04 DIAGNOSIS — M6281 Muscle weakness (generalized): Secondary | ICD-10-CM | POA: Diagnosis not present

## 2018-10-04 DIAGNOSIS — N184 Chronic kidney disease, stage 4 (severe): Secondary | ICD-10-CM | POA: Diagnosis not present

## 2018-10-04 DIAGNOSIS — I480 Paroxysmal atrial fibrillation: Secondary | ICD-10-CM | POA: Diagnosis not present

## 2018-10-05 DIAGNOSIS — G9341 Metabolic encephalopathy: Secondary | ICD-10-CM | POA: Diagnosis not present

## 2018-10-05 DIAGNOSIS — E119 Type 2 diabetes mellitus without complications: Secondary | ICD-10-CM | POA: Diagnosis not present

## 2018-10-05 DIAGNOSIS — M6281 Muscle weakness (generalized): Secondary | ICD-10-CM | POA: Diagnosis not present

## 2018-10-05 DIAGNOSIS — R2689 Other abnormalities of gait and mobility: Secondary | ICD-10-CM | POA: Diagnosis not present

## 2018-10-05 DIAGNOSIS — E876 Hypokalemia: Secondary | ICD-10-CM | POA: Diagnosis not present

## 2018-10-05 DIAGNOSIS — R278 Other lack of coordination: Secondary | ICD-10-CM | POA: Diagnosis not present

## 2018-10-05 DIAGNOSIS — I480 Paroxysmal atrial fibrillation: Secondary | ICD-10-CM | POA: Diagnosis not present

## 2018-10-05 DIAGNOSIS — N184 Chronic kidney disease, stage 4 (severe): Secondary | ICD-10-CM | POA: Diagnosis not present

## 2018-10-08 DIAGNOSIS — R278 Other lack of coordination: Secondary | ICD-10-CM | POA: Diagnosis not present

## 2018-10-08 DIAGNOSIS — R2689 Other abnormalities of gait and mobility: Secondary | ICD-10-CM | POA: Diagnosis not present

## 2018-10-08 DIAGNOSIS — G9341 Metabolic encephalopathy: Secondary | ICD-10-CM | POA: Diagnosis not present

## 2018-10-08 DIAGNOSIS — M6281 Muscle weakness (generalized): Secondary | ICD-10-CM | POA: Diagnosis not present

## 2018-10-08 DIAGNOSIS — I480 Paroxysmal atrial fibrillation: Secondary | ICD-10-CM | POA: Diagnosis not present

## 2018-10-08 DIAGNOSIS — N184 Chronic kidney disease, stage 4 (severe): Secondary | ICD-10-CM | POA: Diagnosis not present

## 2018-10-09 DIAGNOSIS — E119 Type 2 diabetes mellitus without complications: Secondary | ICD-10-CM | POA: Diagnosis not present

## 2018-10-10 DIAGNOSIS — N184 Chronic kidney disease, stage 4 (severe): Secondary | ICD-10-CM | POA: Diagnosis not present

## 2018-10-10 DIAGNOSIS — R2681 Unsteadiness on feet: Secondary | ICD-10-CM | POA: Diagnosis not present

## 2018-10-10 DIAGNOSIS — M6281 Muscle weakness (generalized): Secondary | ICD-10-CM | POA: Diagnosis not present

## 2018-10-10 DIAGNOSIS — R278 Other lack of coordination: Secondary | ICD-10-CM | POA: Diagnosis not present

## 2018-10-10 DIAGNOSIS — R2689 Other abnormalities of gait and mobility: Secondary | ICD-10-CM | POA: Diagnosis not present

## 2018-10-10 DIAGNOSIS — G9341 Metabolic encephalopathy: Secondary | ICD-10-CM | POA: Diagnosis not present

## 2018-10-10 DIAGNOSIS — I480 Paroxysmal atrial fibrillation: Secondary | ICD-10-CM | POA: Diagnosis not present

## 2018-10-11 DIAGNOSIS — I480 Paroxysmal atrial fibrillation: Secondary | ICD-10-CM | POA: Diagnosis not present

## 2018-10-11 DIAGNOSIS — G9341 Metabolic encephalopathy: Secondary | ICD-10-CM | POA: Diagnosis not present

## 2018-10-11 DIAGNOSIS — R2689 Other abnormalities of gait and mobility: Secondary | ICD-10-CM | POA: Diagnosis not present

## 2018-10-11 DIAGNOSIS — R278 Other lack of coordination: Secondary | ICD-10-CM | POA: Diagnosis not present

## 2018-10-11 DIAGNOSIS — M6281 Muscle weakness (generalized): Secondary | ICD-10-CM | POA: Diagnosis not present

## 2018-10-11 DIAGNOSIS — N184 Chronic kidney disease, stage 4 (severe): Secondary | ICD-10-CM | POA: Diagnosis not present

## 2018-10-12 DIAGNOSIS — R2689 Other abnormalities of gait and mobility: Secondary | ICD-10-CM | POA: Diagnosis not present

## 2018-10-12 DIAGNOSIS — R278 Other lack of coordination: Secondary | ICD-10-CM | POA: Diagnosis not present

## 2018-10-12 DIAGNOSIS — N184 Chronic kidney disease, stage 4 (severe): Secondary | ICD-10-CM | POA: Diagnosis not present

## 2018-10-12 DIAGNOSIS — I480 Paroxysmal atrial fibrillation: Secondary | ICD-10-CM | POA: Diagnosis not present

## 2018-10-12 DIAGNOSIS — G9341 Metabolic encephalopathy: Secondary | ICD-10-CM | POA: Diagnosis not present

## 2018-10-12 DIAGNOSIS — M6281 Muscle weakness (generalized): Secondary | ICD-10-CM | POA: Diagnosis not present

## 2018-10-14 DIAGNOSIS — I480 Paroxysmal atrial fibrillation: Secondary | ICD-10-CM | POA: Diagnosis not present

## 2018-10-14 DIAGNOSIS — M6281 Muscle weakness (generalized): Secondary | ICD-10-CM | POA: Diagnosis not present

## 2018-10-14 DIAGNOSIS — N184 Chronic kidney disease, stage 4 (severe): Secondary | ICD-10-CM | POA: Diagnosis not present

## 2018-10-14 DIAGNOSIS — R2689 Other abnormalities of gait and mobility: Secondary | ICD-10-CM | POA: Diagnosis not present

## 2018-10-14 DIAGNOSIS — G9341 Metabolic encephalopathy: Secondary | ICD-10-CM | POA: Diagnosis not present

## 2018-10-14 DIAGNOSIS — R278 Other lack of coordination: Secondary | ICD-10-CM | POA: Diagnosis not present

## 2018-10-16 DIAGNOSIS — R600 Localized edema: Secondary | ICD-10-CM | POA: Diagnosis not present

## 2018-10-16 DIAGNOSIS — G9341 Metabolic encephalopathy: Secondary | ICD-10-CM | POA: Diagnosis not present

## 2018-10-16 DIAGNOSIS — R278 Other lack of coordination: Secondary | ICD-10-CM | POA: Diagnosis not present

## 2018-10-16 DIAGNOSIS — E119 Type 2 diabetes mellitus without complications: Secondary | ICD-10-CM | POA: Diagnosis not present

## 2018-10-16 DIAGNOSIS — N184 Chronic kidney disease, stage 4 (severe): Secondary | ICD-10-CM | POA: Diagnosis not present

## 2018-10-16 DIAGNOSIS — R2689 Other abnormalities of gait and mobility: Secondary | ICD-10-CM | POA: Diagnosis not present

## 2018-10-16 DIAGNOSIS — M6281 Muscle weakness (generalized): Secondary | ICD-10-CM | POA: Diagnosis not present

## 2018-10-16 DIAGNOSIS — I480 Paroxysmal atrial fibrillation: Secondary | ICD-10-CM | POA: Diagnosis not present

## 2018-10-17 DIAGNOSIS — R278 Other lack of coordination: Secondary | ICD-10-CM | POA: Diagnosis not present

## 2018-10-17 DIAGNOSIS — G9341 Metabolic encephalopathy: Secondary | ICD-10-CM | POA: Diagnosis not present

## 2018-10-17 DIAGNOSIS — N184 Chronic kidney disease, stage 4 (severe): Secondary | ICD-10-CM | POA: Diagnosis not present

## 2018-10-17 DIAGNOSIS — M6281 Muscle weakness (generalized): Secondary | ICD-10-CM | POA: Diagnosis not present

## 2018-10-17 DIAGNOSIS — I480 Paroxysmal atrial fibrillation: Secondary | ICD-10-CM | POA: Diagnosis not present

## 2018-10-17 DIAGNOSIS — R2689 Other abnormalities of gait and mobility: Secondary | ICD-10-CM | POA: Diagnosis not present

## 2018-10-18 DIAGNOSIS — N184 Chronic kidney disease, stage 4 (severe): Secondary | ICD-10-CM | POA: Diagnosis not present

## 2018-10-18 DIAGNOSIS — G9341 Metabolic encephalopathy: Secondary | ICD-10-CM | POA: Diagnosis not present

## 2018-10-18 DIAGNOSIS — I10 Essential (primary) hypertension: Secondary | ICD-10-CM | POA: Diagnosis not present

## 2018-10-18 DIAGNOSIS — M6281 Muscle weakness (generalized): Secondary | ICD-10-CM | POA: Diagnosis not present

## 2018-10-18 DIAGNOSIS — I480 Paroxysmal atrial fibrillation: Secondary | ICD-10-CM | POA: Diagnosis not present

## 2018-10-18 DIAGNOSIS — R5381 Other malaise: Secondary | ICD-10-CM | POA: Diagnosis not present

## 2018-10-18 DIAGNOSIS — R2689 Other abnormalities of gait and mobility: Secondary | ICD-10-CM | POA: Diagnosis not present

## 2018-10-18 DIAGNOSIS — E119 Type 2 diabetes mellitus without complications: Secondary | ICD-10-CM | POA: Diagnosis not present

## 2018-10-18 DIAGNOSIS — R278 Other lack of coordination: Secondary | ICD-10-CM | POA: Diagnosis not present

## 2018-10-19 DIAGNOSIS — R278 Other lack of coordination: Secondary | ICD-10-CM | POA: Diagnosis not present

## 2018-10-19 DIAGNOSIS — N184 Chronic kidney disease, stage 4 (severe): Secondary | ICD-10-CM | POA: Diagnosis not present

## 2018-10-19 DIAGNOSIS — R2689 Other abnormalities of gait and mobility: Secondary | ICD-10-CM | POA: Diagnosis not present

## 2018-10-19 DIAGNOSIS — G9341 Metabolic encephalopathy: Secondary | ICD-10-CM | POA: Diagnosis not present

## 2018-10-19 DIAGNOSIS — I480 Paroxysmal atrial fibrillation: Secondary | ICD-10-CM | POA: Diagnosis not present

## 2018-10-19 DIAGNOSIS — M6281 Muscle weakness (generalized): Secondary | ICD-10-CM | POA: Diagnosis not present

## 2018-10-20 DIAGNOSIS — G9341 Metabolic encephalopathy: Secondary | ICD-10-CM | POA: Diagnosis not present

## 2018-10-20 DIAGNOSIS — R278 Other lack of coordination: Secondary | ICD-10-CM | POA: Diagnosis not present

## 2018-10-20 DIAGNOSIS — I480 Paroxysmal atrial fibrillation: Secondary | ICD-10-CM | POA: Diagnosis not present

## 2018-10-20 DIAGNOSIS — M6281 Muscle weakness (generalized): Secondary | ICD-10-CM | POA: Diagnosis not present

## 2018-10-20 DIAGNOSIS — R2689 Other abnormalities of gait and mobility: Secondary | ICD-10-CM | POA: Diagnosis not present

## 2018-10-20 DIAGNOSIS — N184 Chronic kidney disease, stage 4 (severe): Secondary | ICD-10-CM | POA: Diagnosis not present

## 2018-10-23 DIAGNOSIS — G9341 Metabolic encephalopathy: Secondary | ICD-10-CM | POA: Diagnosis not present

## 2018-10-23 DIAGNOSIS — N184 Chronic kidney disease, stage 4 (severe): Secondary | ICD-10-CM | POA: Diagnosis not present

## 2018-10-23 DIAGNOSIS — R2689 Other abnormalities of gait and mobility: Secondary | ICD-10-CM | POA: Diagnosis not present

## 2018-10-23 DIAGNOSIS — M6281 Muscle weakness (generalized): Secondary | ICD-10-CM | POA: Diagnosis not present

## 2018-10-23 DIAGNOSIS — R278 Other lack of coordination: Secondary | ICD-10-CM | POA: Diagnosis not present

## 2018-10-23 DIAGNOSIS — I480 Paroxysmal atrial fibrillation: Secondary | ICD-10-CM | POA: Diagnosis not present

## 2018-10-24 DIAGNOSIS — L0390 Cellulitis, unspecified: Secondary | ICD-10-CM | POA: Diagnosis not present

## 2018-10-24 DIAGNOSIS — R2689 Other abnormalities of gait and mobility: Secondary | ICD-10-CM | POA: Diagnosis not present

## 2018-10-24 DIAGNOSIS — M6281 Muscle weakness (generalized): Secondary | ICD-10-CM | POA: Diagnosis not present

## 2018-10-24 DIAGNOSIS — I480 Paroxysmal atrial fibrillation: Secondary | ICD-10-CM | POA: Diagnosis not present

## 2018-10-24 DIAGNOSIS — R278 Other lack of coordination: Secondary | ICD-10-CM | POA: Diagnosis not present

## 2018-10-24 DIAGNOSIS — R600 Localized edema: Secondary | ICD-10-CM | POA: Diagnosis not present

## 2018-10-24 DIAGNOSIS — Z79899 Other long term (current) drug therapy: Secondary | ICD-10-CM | POA: Diagnosis not present

## 2018-10-24 DIAGNOSIS — N184 Chronic kidney disease, stage 4 (severe): Secondary | ICD-10-CM | POA: Diagnosis not present

## 2018-10-24 DIAGNOSIS — E568 Deficiency of other vitamins: Secondary | ICD-10-CM | POA: Diagnosis not present

## 2018-10-24 DIAGNOSIS — G9341 Metabolic encephalopathy: Secondary | ICD-10-CM | POA: Diagnosis not present

## 2018-10-24 DIAGNOSIS — G6289 Other specified polyneuropathies: Secondary | ICD-10-CM | POA: Diagnosis not present

## 2018-10-24 DIAGNOSIS — I10 Essential (primary) hypertension: Secondary | ICD-10-CM | POA: Diagnosis not present

## 2018-10-26 DIAGNOSIS — N184 Chronic kidney disease, stage 4 (severe): Secondary | ICD-10-CM | POA: Diagnosis not present

## 2018-10-26 DIAGNOSIS — R2689 Other abnormalities of gait and mobility: Secondary | ICD-10-CM | POA: Diagnosis not present

## 2018-10-26 DIAGNOSIS — E876 Hypokalemia: Secondary | ICD-10-CM | POA: Diagnosis not present

## 2018-10-26 DIAGNOSIS — M6281 Muscle weakness (generalized): Secondary | ICD-10-CM | POA: Diagnosis not present

## 2018-10-26 DIAGNOSIS — E538 Deficiency of other specified B group vitamins: Secondary | ICD-10-CM | POA: Diagnosis not present

## 2018-10-26 DIAGNOSIS — R600 Localized edema: Secondary | ICD-10-CM | POA: Diagnosis not present

## 2018-10-26 DIAGNOSIS — R278 Other lack of coordination: Secondary | ICD-10-CM | POA: Diagnosis not present

## 2018-10-26 DIAGNOSIS — I10 Essential (primary) hypertension: Secondary | ICD-10-CM | POA: Diagnosis not present

## 2018-10-26 DIAGNOSIS — I480 Paroxysmal atrial fibrillation: Secondary | ICD-10-CM | POA: Diagnosis not present

## 2018-10-26 DIAGNOSIS — G9341 Metabolic encephalopathy: Secondary | ICD-10-CM | POA: Diagnosis not present

## 2018-10-27 DIAGNOSIS — I480 Paroxysmal atrial fibrillation: Secondary | ICD-10-CM | POA: Diagnosis not present

## 2018-10-27 DIAGNOSIS — N184 Chronic kidney disease, stage 4 (severe): Secondary | ICD-10-CM | POA: Diagnosis not present

## 2018-10-27 DIAGNOSIS — G9341 Metabolic encephalopathy: Secondary | ICD-10-CM | POA: Diagnosis not present

## 2018-10-27 DIAGNOSIS — M6281 Muscle weakness (generalized): Secondary | ICD-10-CM | POA: Diagnosis not present

## 2018-10-27 DIAGNOSIS — R2689 Other abnormalities of gait and mobility: Secondary | ICD-10-CM | POA: Diagnosis not present

## 2018-10-27 DIAGNOSIS — R278 Other lack of coordination: Secondary | ICD-10-CM | POA: Diagnosis not present

## 2018-10-28 DIAGNOSIS — R278 Other lack of coordination: Secondary | ICD-10-CM | POA: Diagnosis not present

## 2018-10-28 DIAGNOSIS — M6281 Muscle weakness (generalized): Secondary | ICD-10-CM | POA: Diagnosis not present

## 2018-10-28 DIAGNOSIS — G9341 Metabolic encephalopathy: Secondary | ICD-10-CM | POA: Diagnosis not present

## 2018-10-28 DIAGNOSIS — R2689 Other abnormalities of gait and mobility: Secondary | ICD-10-CM | POA: Diagnosis not present

## 2018-10-28 DIAGNOSIS — I480 Paroxysmal atrial fibrillation: Secondary | ICD-10-CM | POA: Diagnosis not present

## 2018-10-28 DIAGNOSIS — N184 Chronic kidney disease, stage 4 (severe): Secondary | ICD-10-CM | POA: Diagnosis not present

## 2018-10-29 DIAGNOSIS — N184 Chronic kidney disease, stage 4 (severe): Secondary | ICD-10-CM | POA: Diagnosis not present

## 2018-10-29 DIAGNOSIS — I480 Paroxysmal atrial fibrillation: Secondary | ICD-10-CM | POA: Diagnosis not present

## 2018-10-29 DIAGNOSIS — R278 Other lack of coordination: Secondary | ICD-10-CM | POA: Diagnosis not present

## 2018-10-29 DIAGNOSIS — R2689 Other abnormalities of gait and mobility: Secondary | ICD-10-CM | POA: Diagnosis not present

## 2018-10-29 DIAGNOSIS — Z79899 Other long term (current) drug therapy: Secondary | ICD-10-CM | POA: Diagnosis not present

## 2018-10-29 DIAGNOSIS — G9341 Metabolic encephalopathy: Secondary | ICD-10-CM | POA: Diagnosis not present

## 2018-10-29 DIAGNOSIS — M6281 Muscle weakness (generalized): Secondary | ICD-10-CM | POA: Diagnosis not present

## 2018-10-30 ENCOUNTER — Telehealth: Payer: Self-pay

## 2018-10-30 DIAGNOSIS — R278 Other lack of coordination: Secondary | ICD-10-CM | POA: Diagnosis not present

## 2018-10-30 DIAGNOSIS — R2689 Other abnormalities of gait and mobility: Secondary | ICD-10-CM | POA: Diagnosis not present

## 2018-10-30 DIAGNOSIS — I480 Paroxysmal atrial fibrillation: Secondary | ICD-10-CM | POA: Diagnosis not present

## 2018-10-30 DIAGNOSIS — M6281 Muscle weakness (generalized): Secondary | ICD-10-CM | POA: Diagnosis not present

## 2018-10-30 DIAGNOSIS — N184 Chronic kidney disease, stage 4 (severe): Secondary | ICD-10-CM | POA: Diagnosis not present

## 2018-10-30 DIAGNOSIS — G9341 Metabolic encephalopathy: Secondary | ICD-10-CM | POA: Diagnosis not present

## 2018-10-30 NOTE — Telephone Encounter (Signed)
Called the facility patient is currently at.  Obtained fax number for them to read consent to patient and call back with a verbal if patient agrees or not.  Asked for them when they call back to confirm the number that is to be called at the time of patients appointment.

## 2018-10-31 ENCOUNTER — Telehealth: Payer: Medicare Other | Admitting: Cardiovascular Disease

## 2018-10-31 ENCOUNTER — Other Ambulatory Visit: Payer: Self-pay

## 2018-11-01 DIAGNOSIS — N184 Chronic kidney disease, stage 4 (severe): Secondary | ICD-10-CM | POA: Diagnosis not present

## 2018-11-01 DIAGNOSIS — M6281 Muscle weakness (generalized): Secondary | ICD-10-CM | POA: Diagnosis not present

## 2018-11-01 DIAGNOSIS — R278 Other lack of coordination: Secondary | ICD-10-CM | POA: Diagnosis not present

## 2018-11-01 DIAGNOSIS — I480 Paroxysmal atrial fibrillation: Secondary | ICD-10-CM | POA: Diagnosis not present

## 2018-11-01 DIAGNOSIS — G9341 Metabolic encephalopathy: Secondary | ICD-10-CM | POA: Diagnosis not present

## 2018-11-01 DIAGNOSIS — R2689 Other abnormalities of gait and mobility: Secondary | ICD-10-CM | POA: Diagnosis not present

## 2018-11-02 DIAGNOSIS — R278 Other lack of coordination: Secondary | ICD-10-CM | POA: Diagnosis not present

## 2018-11-02 DIAGNOSIS — B349 Viral infection, unspecified: Secondary | ICD-10-CM | POA: Diagnosis not present

## 2018-11-02 DIAGNOSIS — I129 Hypertensive chronic kidney disease with stage 1 through stage 4 chronic kidney disease, or unspecified chronic kidney disease: Secondary | ICD-10-CM | POA: Diagnosis not present

## 2018-11-02 DIAGNOSIS — R2689 Other abnormalities of gait and mobility: Secondary | ICD-10-CM | POA: Diagnosis not present

## 2018-11-02 DIAGNOSIS — I480 Paroxysmal atrial fibrillation: Secondary | ICD-10-CM | POA: Diagnosis not present

## 2018-11-02 DIAGNOSIS — M6281 Muscle weakness (generalized): Secondary | ICD-10-CM | POA: Diagnosis not present

## 2018-11-02 DIAGNOSIS — E538 Deficiency of other specified B group vitamins: Secondary | ICD-10-CM | POA: Diagnosis not present

## 2018-11-02 DIAGNOSIS — N184 Chronic kidney disease, stage 4 (severe): Secondary | ICD-10-CM | POA: Diagnosis not present

## 2018-11-02 DIAGNOSIS — G9341 Metabolic encephalopathy: Secondary | ICD-10-CM | POA: Diagnosis not present

## 2018-11-02 DIAGNOSIS — E876 Hypokalemia: Secondary | ICD-10-CM | POA: Diagnosis not present

## 2018-11-02 DIAGNOSIS — E119 Type 2 diabetes mellitus without complications: Secondary | ICD-10-CM | POA: Diagnosis not present

## 2018-11-02 DIAGNOSIS — J984 Other disorders of lung: Secondary | ICD-10-CM | POA: Diagnosis not present

## 2018-11-03 DIAGNOSIS — G9341 Metabolic encephalopathy: Secondary | ICD-10-CM | POA: Diagnosis not present

## 2018-11-03 DIAGNOSIS — N184 Chronic kidney disease, stage 4 (severe): Secondary | ICD-10-CM | POA: Diagnosis not present

## 2018-11-03 DIAGNOSIS — R278 Other lack of coordination: Secondary | ICD-10-CM | POA: Diagnosis not present

## 2018-11-03 DIAGNOSIS — M6281 Muscle weakness (generalized): Secondary | ICD-10-CM | POA: Diagnosis not present

## 2018-11-03 DIAGNOSIS — I480 Paroxysmal atrial fibrillation: Secondary | ICD-10-CM | POA: Diagnosis not present

## 2018-11-03 DIAGNOSIS — R2689 Other abnormalities of gait and mobility: Secondary | ICD-10-CM | POA: Diagnosis not present

## 2018-11-06 DIAGNOSIS — R278 Other lack of coordination: Secondary | ICD-10-CM | POA: Diagnosis not present

## 2018-11-06 DIAGNOSIS — Z79899 Other long term (current) drug therapy: Secondary | ICD-10-CM | POA: Diagnosis not present

## 2018-11-06 DIAGNOSIS — N184 Chronic kidney disease, stage 4 (severe): Secondary | ICD-10-CM | POA: Diagnosis not present

## 2018-11-06 DIAGNOSIS — G9341 Metabolic encephalopathy: Secondary | ICD-10-CM | POA: Diagnosis not present

## 2018-11-06 DIAGNOSIS — R2689 Other abnormalities of gait and mobility: Secondary | ICD-10-CM | POA: Diagnosis not present

## 2018-11-06 DIAGNOSIS — M6281 Muscle weakness (generalized): Secondary | ICD-10-CM | POA: Diagnosis not present

## 2018-11-06 DIAGNOSIS — I480 Paroxysmal atrial fibrillation: Secondary | ICD-10-CM | POA: Diagnosis not present

## 2018-11-07 DIAGNOSIS — R278 Other lack of coordination: Secondary | ICD-10-CM | POA: Diagnosis not present

## 2018-11-07 DIAGNOSIS — N184 Chronic kidney disease, stage 4 (severe): Secondary | ICD-10-CM | POA: Diagnosis not present

## 2018-11-07 DIAGNOSIS — I480 Paroxysmal atrial fibrillation: Secondary | ICD-10-CM | POA: Diagnosis not present

## 2018-11-07 DIAGNOSIS — G9341 Metabolic encephalopathy: Secondary | ICD-10-CM | POA: Diagnosis not present

## 2018-11-07 DIAGNOSIS — R2689 Other abnormalities of gait and mobility: Secondary | ICD-10-CM | POA: Diagnosis not present

## 2018-11-07 DIAGNOSIS — M6281 Muscle weakness (generalized): Secondary | ICD-10-CM | POA: Diagnosis not present

## 2018-11-08 DIAGNOSIS — R2689 Other abnormalities of gait and mobility: Secondary | ICD-10-CM | POA: Diagnosis not present

## 2018-11-08 DIAGNOSIS — M6281 Muscle weakness (generalized): Secondary | ICD-10-CM | POA: Diagnosis not present

## 2018-11-08 DIAGNOSIS — G9341 Metabolic encephalopathy: Secondary | ICD-10-CM | POA: Diagnosis not present

## 2018-11-08 DIAGNOSIS — N184 Chronic kidney disease, stage 4 (severe): Secondary | ICD-10-CM | POA: Diagnosis not present

## 2018-11-08 DIAGNOSIS — R278 Other lack of coordination: Secondary | ICD-10-CM | POA: Diagnosis not present

## 2018-11-08 DIAGNOSIS — I480 Paroxysmal atrial fibrillation: Secondary | ICD-10-CM | POA: Diagnosis not present

## 2018-11-09 DIAGNOSIS — I480 Paroxysmal atrial fibrillation: Secondary | ICD-10-CM | POA: Diagnosis not present

## 2018-11-09 DIAGNOSIS — R2689 Other abnormalities of gait and mobility: Secondary | ICD-10-CM | POA: Diagnosis not present

## 2018-11-09 DIAGNOSIS — G9341 Metabolic encephalopathy: Secondary | ICD-10-CM | POA: Diagnosis not present

## 2018-11-09 DIAGNOSIS — N184 Chronic kidney disease, stage 4 (severe): Secondary | ICD-10-CM | POA: Diagnosis not present

## 2018-11-09 DIAGNOSIS — R2681 Unsteadiness on feet: Secondary | ICD-10-CM | POA: Diagnosis not present

## 2018-11-09 DIAGNOSIS — Z8616 Personal history of COVID-19: Secondary | ICD-10-CM

## 2018-11-09 DIAGNOSIS — R278 Other lack of coordination: Secondary | ICD-10-CM | POA: Diagnosis not present

## 2018-11-09 DIAGNOSIS — M6281 Muscle weakness (generalized): Secondary | ICD-10-CM | POA: Diagnosis not present

## 2018-11-09 HISTORY — DX: Personal history of COVID-19: Z86.16

## 2018-11-10 DIAGNOSIS — G9341 Metabolic encephalopathy: Secondary | ICD-10-CM | POA: Diagnosis not present

## 2018-11-10 DIAGNOSIS — I480 Paroxysmal atrial fibrillation: Secondary | ICD-10-CM | POA: Diagnosis not present

## 2018-11-10 DIAGNOSIS — N184 Chronic kidney disease, stage 4 (severe): Secondary | ICD-10-CM | POA: Diagnosis not present

## 2018-11-10 DIAGNOSIS — R2689 Other abnormalities of gait and mobility: Secondary | ICD-10-CM | POA: Diagnosis not present

## 2018-11-10 DIAGNOSIS — M6281 Muscle weakness (generalized): Secondary | ICD-10-CM | POA: Diagnosis not present

## 2018-11-10 DIAGNOSIS — R278 Other lack of coordination: Secondary | ICD-10-CM | POA: Diagnosis not present

## 2018-11-12 DIAGNOSIS — R2689 Other abnormalities of gait and mobility: Secondary | ICD-10-CM | POA: Diagnosis not present

## 2018-11-12 DIAGNOSIS — G9341 Metabolic encephalopathy: Secondary | ICD-10-CM | POA: Diagnosis not present

## 2018-11-12 DIAGNOSIS — R278 Other lack of coordination: Secondary | ICD-10-CM | POA: Diagnosis not present

## 2018-11-12 DIAGNOSIS — I480 Paroxysmal atrial fibrillation: Secondary | ICD-10-CM | POA: Diagnosis not present

## 2018-11-12 DIAGNOSIS — M6281 Muscle weakness (generalized): Secondary | ICD-10-CM | POA: Diagnosis not present

## 2018-11-12 DIAGNOSIS — N184 Chronic kidney disease, stage 4 (severe): Secondary | ICD-10-CM | POA: Diagnosis not present

## 2018-11-13 DIAGNOSIS — E119 Type 2 diabetes mellitus without complications: Secondary | ICD-10-CM | POA: Diagnosis not present

## 2018-11-13 DIAGNOSIS — M6281 Muscle weakness (generalized): Secondary | ICD-10-CM | POA: Diagnosis not present

## 2018-11-13 DIAGNOSIS — R278 Other lack of coordination: Secondary | ICD-10-CM | POA: Diagnosis not present

## 2018-11-13 DIAGNOSIS — R600 Localized edema: Secondary | ICD-10-CM | POA: Diagnosis not present

## 2018-11-13 DIAGNOSIS — J984 Other disorders of lung: Secondary | ICD-10-CM | POA: Diagnosis not present

## 2018-11-13 DIAGNOSIS — N184 Chronic kidney disease, stage 4 (severe): Secondary | ICD-10-CM | POA: Diagnosis not present

## 2018-11-13 DIAGNOSIS — R2689 Other abnormalities of gait and mobility: Secondary | ICD-10-CM | POA: Diagnosis not present

## 2018-11-13 DIAGNOSIS — G9341 Metabolic encephalopathy: Secondary | ICD-10-CM | POA: Diagnosis not present

## 2018-11-13 DIAGNOSIS — I129 Hypertensive chronic kidney disease with stage 1 through stage 4 chronic kidney disease, or unspecified chronic kidney disease: Secondary | ICD-10-CM | POA: Diagnosis not present

## 2018-11-13 DIAGNOSIS — I480 Paroxysmal atrial fibrillation: Secondary | ICD-10-CM | POA: Diagnosis not present

## 2018-11-15 ENCOUNTER — Encounter (HOSPITAL_COMMUNITY): Payer: Self-pay | Admitting: Emergency Medicine

## 2018-11-15 ENCOUNTER — Emergency Department (HOSPITAL_COMMUNITY): Payer: Medicare Other

## 2018-11-15 ENCOUNTER — Inpatient Hospital Stay (HOSPITAL_COMMUNITY)
Admission: EM | Admit: 2018-11-15 | Discharge: 2018-11-27 | DRG: 177 | Disposition: A | Discharge status: Skilled Nursing Facility | Payer: Medicare Other | Source: Skilled Nursing Facility | Attending: Internal Medicine | Admitting: Internal Medicine

## 2018-11-15 ENCOUNTER — Other Ambulatory Visit: Payer: Self-pay

## 2018-11-15 DIAGNOSIS — J189 Pneumonia, unspecified organism: Secondary | ICD-10-CM | POA: Diagnosis not present

## 2018-11-15 DIAGNOSIS — L97829 Non-pressure chronic ulcer of other part of left lower leg with unspecified severity: Secondary | ICD-10-CM | POA: Diagnosis present

## 2018-11-15 DIAGNOSIS — J440 Chronic obstructive pulmonary disease with acute lower respiratory infection: Secondary | ICD-10-CM | POA: Diagnosis present

## 2018-11-15 DIAGNOSIS — E1165 Type 2 diabetes mellitus with hyperglycemia: Secondary | ICD-10-CM | POA: Diagnosis not present

## 2018-11-15 DIAGNOSIS — J1289 Other viral pneumonia: Secondary | ICD-10-CM | POA: Diagnosis present

## 2018-11-15 DIAGNOSIS — I2510 Atherosclerotic heart disease of native coronary artery without angina pectoris: Secondary | ICD-10-CM | POA: Diagnosis not present

## 2018-11-15 DIAGNOSIS — E785 Hyperlipidemia, unspecified: Secondary | ICD-10-CM | POA: Diagnosis present

## 2018-11-15 DIAGNOSIS — E1122 Type 2 diabetes mellitus with diabetic chronic kidney disease: Secondary | ICD-10-CM | POA: Diagnosis not present

## 2018-11-15 DIAGNOSIS — N184 Chronic kidney disease, stage 4 (severe): Secondary | ICD-10-CM | POA: Diagnosis not present

## 2018-11-15 DIAGNOSIS — N179 Acute kidney failure, unspecified: Secondary | ICD-10-CM

## 2018-11-15 DIAGNOSIS — Z7901 Long term (current) use of anticoagulants: Secondary | ICD-10-CM | POA: Diagnosis not present

## 2018-11-15 DIAGNOSIS — E119 Type 2 diabetes mellitus without complications: Secondary | ICD-10-CM | POA: Diagnosis not present

## 2018-11-15 DIAGNOSIS — T380X5A Adverse effect of glucocorticoids and synthetic analogues, initial encounter: Secondary | ICD-10-CM | POA: Diagnosis present

## 2018-11-15 DIAGNOSIS — Z833 Family history of diabetes mellitus: Secondary | ICD-10-CM

## 2018-11-15 DIAGNOSIS — J988 Other specified respiratory disorders: Secondary | ICD-10-CM | POA: Diagnosis not present

## 2018-11-15 DIAGNOSIS — E7800 Pure hypercholesterolemia, unspecified: Secondary | ICD-10-CM | POA: Diagnosis present

## 2018-11-15 DIAGNOSIS — I83018 Varicose veins of right lower extremity with ulcer other part of lower leg: Secondary | ICD-10-CM | POA: Diagnosis present

## 2018-11-15 DIAGNOSIS — E1169 Type 2 diabetes mellitus with other specified complication: Secondary | ICD-10-CM | POA: Diagnosis present

## 2018-11-15 DIAGNOSIS — L97919 Non-pressure chronic ulcer of unspecified part of right lower leg with unspecified severity: Secondary | ICD-10-CM | POA: Diagnosis present

## 2018-11-15 DIAGNOSIS — I4819 Other persistent atrial fibrillation: Secondary | ICD-10-CM | POA: Diagnosis present

## 2018-11-15 DIAGNOSIS — G9341 Metabolic encephalopathy: Secondary | ICD-10-CM | POA: Diagnosis not present

## 2018-11-15 DIAGNOSIS — I7389 Other specified peripheral vascular diseases: Secondary | ICD-10-CM | POA: Diagnosis not present

## 2018-11-15 DIAGNOSIS — R1312 Dysphagia, oropharyngeal phase: Secondary | ICD-10-CM | POA: Diagnosis not present

## 2018-11-15 DIAGNOSIS — Z6841 Body Mass Index (BMI) 40.0 and over, adult: Secondary | ICD-10-CM

## 2018-11-15 DIAGNOSIS — F329 Major depressive disorder, single episode, unspecified: Secondary | ICD-10-CM | POA: Diagnosis not present

## 2018-11-15 DIAGNOSIS — N183 Chronic kidney disease, stage 3 (moderate): Secondary | ICD-10-CM | POA: Diagnosis not present

## 2018-11-15 DIAGNOSIS — L97929 Non-pressure chronic ulcer of unspecified part of left lower leg with unspecified severity: Secondary | ICD-10-CM | POA: Diagnosis present

## 2018-11-15 DIAGNOSIS — R05 Cough: Secondary | ICD-10-CM | POA: Diagnosis not present

## 2018-11-15 DIAGNOSIS — I878 Other specified disorders of veins: Secondary | ICD-10-CM | POA: Diagnosis present

## 2018-11-15 DIAGNOSIS — J9601 Acute respiratory failure with hypoxia: Secondary | ICD-10-CM | POA: Diagnosis present

## 2018-11-15 DIAGNOSIS — J069 Acute upper respiratory infection, unspecified: Secondary | ICD-10-CM | POA: Diagnosis present

## 2018-11-15 DIAGNOSIS — E662 Morbid (severe) obesity with alveolar hypoventilation: Secondary | ICD-10-CM | POA: Diagnosis present

## 2018-11-15 DIAGNOSIS — Z981 Arthrodesis status: Secondary | ICD-10-CM

## 2018-11-15 DIAGNOSIS — T17920A Food in respiratory tract, part unspecified causing asphyxiation, initial encounter: Secondary | ICD-10-CM | POA: Diagnosis not present

## 2018-11-15 DIAGNOSIS — I4891 Unspecified atrial fibrillation: Secondary | ICD-10-CM | POA: Diagnosis present

## 2018-11-15 DIAGNOSIS — I4811 Longstanding persistent atrial fibrillation: Secondary | ICD-10-CM | POA: Diagnosis not present

## 2018-11-15 DIAGNOSIS — I480 Paroxysmal atrial fibrillation: Secondary | ICD-10-CM | POA: Diagnosis not present

## 2018-11-15 DIAGNOSIS — R238 Other skin changes: Secondary | ICD-10-CM | POA: Diagnosis present

## 2018-11-15 DIAGNOSIS — I5032 Chronic diastolic (congestive) heart failure: Secondary | ICD-10-CM | POA: Diagnosis not present

## 2018-11-15 DIAGNOSIS — J449 Chronic obstructive pulmonary disease, unspecified: Secondary | ICD-10-CM | POA: Diagnosis not present

## 2018-11-15 DIAGNOSIS — I252 Old myocardial infarction: Secondary | ICD-10-CM

## 2018-11-15 DIAGNOSIS — R0902 Hypoxemia: Secondary | ICD-10-CM

## 2018-11-15 DIAGNOSIS — Y95 Nosocomial condition: Secondary | ICD-10-CM | POA: Diagnosis not present

## 2018-11-15 DIAGNOSIS — I130 Hypertensive heart and chronic kidney disease with heart failure and stage 1 through stage 4 chronic kidney disease, or unspecified chronic kidney disease: Secondary | ICD-10-CM | POA: Diagnosis present

## 2018-11-15 DIAGNOSIS — L97819 Non-pressure chronic ulcer of other part of right lower leg with unspecified severity: Secondary | ICD-10-CM | POA: Diagnosis not present

## 2018-11-15 DIAGNOSIS — I129 Hypertensive chronic kidney disease with stage 1 through stage 4 chronic kidney disease, or unspecified chronic kidney disease: Secondary | ICD-10-CM | POA: Diagnosis present

## 2018-11-15 DIAGNOSIS — I482 Chronic atrial fibrillation, unspecified: Secondary | ICD-10-CM | POA: Diagnosis not present

## 2018-11-15 DIAGNOSIS — W1789XA Other fall from one level to another, initial encounter: Secondary | ICD-10-CM | POA: Diagnosis not present

## 2018-11-15 DIAGNOSIS — R0689 Other abnormalities of breathing: Secondary | ICD-10-CM | POA: Diagnosis not present

## 2018-11-15 DIAGNOSIS — E1322 Other specified diabetes mellitus with diabetic chronic kidney disease: Secondary | ICD-10-CM | POA: Diagnosis not present

## 2018-11-15 DIAGNOSIS — Z79899 Other long term (current) drug therapy: Secondary | ICD-10-CM

## 2018-11-15 DIAGNOSIS — R2681 Unsteadiness on feet: Secondary | ICD-10-CM | POA: Diagnosis not present

## 2018-11-15 DIAGNOSIS — R41841 Cognitive communication deficit: Secondary | ICD-10-CM | POA: Diagnosis not present

## 2018-11-15 DIAGNOSIS — I890 Lymphedema, not elsewhere classified: Secondary | ICD-10-CM | POA: Diagnosis present

## 2018-11-15 DIAGNOSIS — L89152 Pressure ulcer of sacral region, stage 2: Secondary | ICD-10-CM | POA: Diagnosis present

## 2018-11-15 DIAGNOSIS — M6281 Muscle weakness (generalized): Secondary | ICD-10-CM | POA: Diagnosis not present

## 2018-11-15 DIAGNOSIS — R278 Other lack of coordination: Secondary | ICD-10-CM | POA: Diagnosis not present

## 2018-11-15 DIAGNOSIS — J80 Acute respiratory distress syndrome: Secondary | ICD-10-CM | POA: Diagnosis not present

## 2018-11-15 DIAGNOSIS — E1142 Type 2 diabetes mellitus with diabetic polyneuropathy: Secondary | ICD-10-CM | POA: Diagnosis present

## 2018-11-15 DIAGNOSIS — J984 Other disorders of lung: Secondary | ICD-10-CM | POA: Diagnosis not present

## 2018-11-15 DIAGNOSIS — H409 Unspecified glaucoma: Secondary | ICD-10-CM | POA: Diagnosis present

## 2018-11-15 DIAGNOSIS — Z7401 Bed confinement status: Secondary | ICD-10-CM | POA: Diagnosis not present

## 2018-11-15 DIAGNOSIS — I83028 Varicose veins of left lower extremity with ulcer other part of lower leg: Secondary | ICD-10-CM | POA: Diagnosis present

## 2018-11-15 DIAGNOSIS — U071 COVID-19: Secondary | ICD-10-CM | POA: Diagnosis present

## 2018-11-15 DIAGNOSIS — J302 Other seasonal allergic rhinitis: Secondary | ICD-10-CM | POA: Diagnosis present

## 2018-11-15 DIAGNOSIS — I83019 Varicose veins of right lower extremity with ulcer of unspecified site: Secondary | ICD-10-CM | POA: Diagnosis present

## 2018-11-15 DIAGNOSIS — Z800 Family history of malignant neoplasm of digestive organs: Secondary | ICD-10-CM

## 2018-11-15 DIAGNOSIS — I83029 Varicose veins of left lower extremity with ulcer of unspecified site: Secondary | ICD-10-CM

## 2018-11-15 DIAGNOSIS — M2550 Pain in unspecified joint: Secondary | ICD-10-CM | POA: Diagnosis not present

## 2018-11-15 DIAGNOSIS — Z8249 Family history of ischemic heart disease and other diseases of the circulatory system: Secondary | ICD-10-CM

## 2018-11-15 DIAGNOSIS — Z7984 Long term (current) use of oral hypoglycemic drugs: Secondary | ICD-10-CM

## 2018-11-15 DIAGNOSIS — R0602 Shortness of breath: Secondary | ICD-10-CM | POA: Diagnosis not present

## 2018-11-15 DIAGNOSIS — Z825 Family history of asthma and other chronic lower respiratory diseases: Secondary | ICD-10-CM

## 2018-11-15 DIAGNOSIS — R2689 Other abnormalities of gait and mobility: Secondary | ICD-10-CM | POA: Diagnosis not present

## 2018-11-15 DIAGNOSIS — S81809A Unspecified open wound, unspecified lower leg, initial encounter: Secondary | ICD-10-CM | POA: Diagnosis present

## 2018-11-15 DIAGNOSIS — Z955 Presence of coronary angioplasty implant and graft: Secondary | ICD-10-CM

## 2018-11-15 DIAGNOSIS — I4821 Permanent atrial fibrillation: Secondary | ICD-10-CM | POA: Diagnosis present

## 2018-11-15 DIAGNOSIS — Z803 Family history of malignant neoplasm of breast: Secondary | ICD-10-CM

## 2018-11-15 LAB — LACTIC ACID, PLASMA: Lactic Acid, Venous: 2 mmol/L (ref 0.5–1.9)

## 2018-11-15 LAB — FIBRINOGEN: Fibrinogen: 648 mg/dL — ABNORMAL HIGH (ref 210–475)

## 2018-11-15 LAB — CBC WITH DIFFERENTIAL/PLATELET
Abs Immature Granulocytes: 0.04 10*3/uL (ref 0.00–0.07)
Basophils Absolute: 0 10*3/uL (ref 0.0–0.1)
Basophils Relative: 0 %
Eosinophils Absolute: 0 10*3/uL (ref 0.0–0.5)
Eosinophils Relative: 0 %
HCT: 44 % (ref 39.0–52.0)
Hemoglobin: 14 g/dL (ref 13.0–17.0)
Immature Granulocytes: 0 %
Lymphocytes Relative: 11 %
Lymphs Abs: 1.1 10*3/uL (ref 0.7–4.0)
MCH: 27.2 pg (ref 26.0–34.0)
MCHC: 31.8 g/dL (ref 30.0–36.0)
MCV: 85.4 fL (ref 80.0–100.0)
Monocytes Absolute: 0.8 10*3/uL (ref 0.1–1.0)
Monocytes Relative: 9 %
Neutro Abs: 7.4 10*3/uL (ref 1.7–7.7)
Neutrophils Relative %: 80 %
Platelets: 248 10*3/uL (ref 150–400)
RBC: 5.15 MIL/uL (ref 4.22–5.81)
RDW: 17.2 % — ABNORMAL HIGH (ref 11.5–15.5)
WBC: 9.4 10*3/uL (ref 4.0–10.5)
nRBC: 0 % (ref 0.0–0.2)

## 2018-11-15 LAB — COMPREHENSIVE METABOLIC PANEL
ALT: 33 U/L (ref 0–44)
AST: 48 U/L — ABNORMAL HIGH (ref 15–41)
Albumin: 3.3 g/dL — ABNORMAL LOW (ref 3.5–5.0)
Alkaline Phosphatase: 68 U/L (ref 38–126)
Anion gap: 13 (ref 5–15)
BUN: 28 mg/dL — ABNORMAL HIGH (ref 8–23)
CO2: 24 mmol/L (ref 22–32)
Calcium: 8.1 mg/dL — ABNORMAL LOW (ref 8.9–10.3)
Chloride: 99 mmol/L (ref 98–111)
Creatinine, Ser: 1.83 mg/dL — ABNORMAL HIGH (ref 0.61–1.24)
GFR calc Af Amer: 40 mL/min — ABNORMAL LOW (ref >60)
GFR calc non Af Amer: 34 mL/min — ABNORMAL LOW (ref >60)
Glucose, Bld: 237 mg/dL — ABNORMAL HIGH (ref 70–99)
Potassium: 3.5 mmol/L (ref 3.5–5.1)
Sodium: 136 mmol/L (ref 135–145)
Total Bilirubin: 0.7 mg/dL (ref 0.3–1.2)
Total Protein: 6.6 g/dL (ref 6.5–8.1)

## 2018-11-15 LAB — C-REACTIVE PROTEIN: CRP: 7 mg/dL — ABNORMAL HIGH (ref <1.0)

## 2018-11-15 LAB — FERRITIN: Ferritin: 181 ng/mL (ref 24–336)

## 2018-11-15 LAB — PROCALCITONIN: Procalcitonin: 0.13 ng/mL

## 2018-11-15 LAB — TRIGLYCERIDES: Triglycerides: 59 mg/dL (ref <150)

## 2018-11-15 LAB — D-DIMER, QUANTITATIVE: D-Dimer, Quant: 1.85 ug/mL-FEU — ABNORMAL HIGH (ref 0.00–0.50)

## 2018-11-15 LAB — SARS CORONAVIRUS 2 BY RT PCR (HOSPITAL ORDER, PERFORMED IN ~~LOC~~ HOSPITAL LAB): SARS Coronavirus 2: POSITIVE — AB

## 2018-11-15 LAB — LACTATE DEHYDROGENASE: LDH: 233 U/L — ABNORMAL HIGH (ref 98–192)

## 2018-11-15 MED ORDER — SODIUM CHLORIDE 0.9 % IV SOLN
2.0000 g | Freq: Once | INTRAVENOUS | Status: AC
  Administered 2018-11-15: 20:00:00 2 g via INTRAVENOUS
  Filled 2018-11-15: qty 2

## 2018-11-15 MED ORDER — VANCOMYCIN HCL IN DEXTROSE 1-5 GM/200ML-% IV SOLN
1000.0000 mg | Freq: Once | INTRAVENOUS | Status: AC
  Administered 2018-11-15: 21:00:00 1000 mg via INTRAVENOUS
  Filled 2018-11-15: qty 200

## 2018-11-15 MED ORDER — VANCOMYCIN HCL 10 G IV SOLR
1500.0000 mg | Freq: Once | INTRAVENOUS | Status: AC
  Administered 2018-11-15: 1500 mg via INTRAVENOUS
  Filled 2018-11-15: qty 1500

## 2018-11-15 NOTE — Progress Notes (Signed)
A consult was received from an ED physician for vancomycin, cefepime per pharmacy dosing.  The patient's profile has been reviewed for ht/wt/allergies/indication/available labs.   A one time order has been placed for add vancomycin 1.5gm to total 2.5gm iv to be given.  Prior Vancomycin/cefepime charted.  Further antibiotics/pharmacy consults should be ordered by admitting physician if indicated.                       Thank you, Nani Skillern Crowford 11/15/2018  11:36 PM

## 2018-11-15 NOTE — ED Triage Notes (Addendum)
Patient transported by EMS from [facility] and [facility]. Pt c/o SOB, cough, and fever that started today.   Reginald [name] reports COVID test was negative per EMS.   EMS VS BP 126/78, HR 112, RR 24, SpO2 92% on non-rebreather, CBG 218.   EMS gave EPI IM.

## 2018-11-15 NOTE — ED Provider Notes (Addendum)
[facility] DEPT Provider Note   CSN: [id number] Arrival date & time: 11/15/18  1743    History   Chief Complaint Chief Complaint  Patient presents with  . Shortness of Breath  . Fever    HPI LAWERENCE [name] is a 83 y.o. male.     HPI 83 year old male with a history of chronic venous insufficiency, congestive heart failure, chronic atrial fibrillation on Xarelto, and COPD presents the emergency department with complaints of fever shortness of breath and cough since today.  He is currently living at [facility] and rehab center.  He reportedly has had a negative COVID-19 test within the rehabilitation center.  I do not have documentation of this.  He presents with these as new complaints today.  Patient reports he wears 2 L of oxygen nasal cannula.  He is brought to the emergency department on nonrebreather for EMS.  O2 sats for EMS reported to be 92% on nonrebreather.   Past Medical History:  Diagnosis Date  . Anteroseptal myocardial infarction ([place])   . Atrial fibrillation (HCC)    a. chronic, on Xarelto  . CHF (congestive heart failure) ([place])   . Chronic kidney disease   . COPD (chronic obstructive pulmonary disease) ([place])   . Coronary atherosclerosis of unspecified type of vessel, native or graft    a. s/p prior PCI to LAD in 1997  . Diverticulosis   . Glaucoma   . Hypercholesteremia   . Hypertensive renal disease   . Hypokalemia   . Obesity hypoventilation syndrome ([place])   . Obesity, unspecified   . OSA (obstructive sleep apnea)   . Rhabdomyolysis   . Type II or unspecified type diabetes mellitus without mention of complication, not stated as uncontrolled   . Unspecified essential hypertension   . Vestibulopathy     Patient Active Problem List   Diagnosis Date Noted  . Chronic venous insufficiency 10/04/2017  . Venous stasis ulcers of both lower extremities ([place]) 10/04/2017  . Nonspecific chest pain 10/27/2016  . Morbid obesity  ([place]) 05/02/2016  . Lymphedema 05/02/2016  . Abnormal laboratory test result 10/15/2015  . Cough 10/08/2015  . Hematuria 09/21/2015  . Hypokalemia 08/13/2015  . HTN (hypertension) 08/13/2015  . Multiple open wounds of lower leg 07/22/2015  . CKD (chronic kidney disease) stage 4, GFR 15-29 ml/min (HCC) 07/22/2015  . Cellulitis of leg, right 06/28/2015  . ARF (acute renal failure) ([place]) 06/28/2015  . Hypertensive heart disease with CHF (congestive heart failure) ([place]) 05/19/2015  . CAD (coronary artery disease), native coronary artery 05/19/2015  . Hyperlipidemia 05/19/2015  . Vitamin D deficiency 05/19/2015  . Pressure ulcer 05/16/2015  . Diastolic dysfunction with chronic heart failure ([place]) 05/08/2015  . Blisters of multiple sites 05/08/2015  . Type 2 diabetes mellitus ([place])   . Obesity, unspecified   . OSA (obstructive sleep apnea)   . Atrial fibrillation ([place])   . Secondary DM with CKD stage 4 and hypertension ([place])   . Glaucoma   . Cellulitis 05/07/2015    Past Surgical History:  Procedure Laterality Date  . APPENDECTOMY  1985  . CARDIAC CATHETERIZATION    . CHOLECYSTECTOMY  06/2000  . CORONARY ANGIOPLASTY  08/14/1995   stent placement to LAD   . ENDOVENOUS ABLATION SAPHENOUS VEIN W/ LASER Left 02/21/2018   endovenous laser ablation L GSV by Ruta Hinds MD   . [place] Right 1985  . KNEE ARTHROSCOPY Left 1991  . LUMBAR FUSION    .  NOSE SURGERY  1970s   Per Dr. Terrence Dupont [facility] in pt chart        Home Medications    Prior to Admission medications   Medication Sig Start Date End Date Taking? Authorizing Provider  acetaminophen (TYLENOL) 325 MG tablet Take 650 mg by mouth every 4 (four) hours as needed for mild pain.   Yes [provider]  amLODipine (NORVASC) 10 MG tablet Take 10 mg by mouth daily.   Yes [provider]  atorvastatin (LIPITOR) 20 MG tablet Take 20 mg by mouth at bedtime.    Yes [provider]  cetirizine  (ZYRTEC) 10 MG tablet Take 10 mg by mouth at bedtime.   Yes [provider]  cholecalciferol (VITAMIN D) 1000 UNITS tablet Take 1,000 Units by mouth daily.   Yes [provider]  docusate sodium (COLACE) 100 MG capsule Take 100 mg by mouth at bedtime.   Yes [provider]  Dulaglutide (TRULICITY) 0.07 MA/2.6JF SOPN Inject 0.5 mLs into the skin every Monday.   Yes [provider]  escitalopram (LEXAPRO) 5 MG tablet Take 5 mg by mouth daily.   Yes [provider]  gabapentin (NEURONTIN) 100 MG capsule Take 100 mg by mouth 3 (three) times daily.   Yes [provider]  glipiZIDE (GLUCOTROL XL) 5 MG 24 hr tablet Take 5 mg by mouth daily with breakfast.   Yes [provider]  latanoprost (XALATAN) 0.005 % ophthalmic solution Place 1 drop into both eyes at bedtime. 01/03/17  Yes [provider]  Magnesium 400 MG TABS Take 400 mg by mouth daily.   Yes [provider]  Melatonin 5 MG TABS Take 5 mg by mouth at bedtime.   Yes [provider]  Menthol, Topical Analgesic, 4.5 % GEL Apply 1 application topically 2 (two) times daily as needed (pain). Apply to forehead and left arm   Yes [provider]  metoprolol tartrate (LOPRESSOR) 25 MG tablet Take 1 tablet (25 mg total) by mouth 2 (two) times daily. Patient taking differently: Take 25 mg by mouth daily.  05/18/15  Yes Charolette Forward, MD  nitroGLYCERIN (NITROSTAT) 0.4 MG SL tablet Place 0.4 mg under the tongue every 5 (five) minutes as needed for chest pain.   Yes [provider]  Potassium Chloride ER 20 MEQ TBCR Take 20 mEq by mouth daily.   Yes [provider]  silver sulfADIAZINE (SILVADENE) 1 % cream Apply 1 application topically every other day. Apply to bilateral lower legs prior to re-wrapping   Yes [provider]  Skin Protectants, Misc. (EUCERIN) cream Apply 1 application topically 2 (two) times a day.   Yes [provider]  torsemide (DEMADEX) 20 MG tablet Take 40 mg by mouth daily.   Yes [provider]  TRADJENTA 5 MG TABS tablet Take 5 mg by mouth daily. 10/26/18  Yes [provider]  vitamin B-12 (CYANOCOBALAMIN) 1000 MCG tablet Take 1,000 mcg by mouth daily.   Yes [provider]  vitamin C (ASCORBIC ACID) 500 MG tablet Take 1,000 mg by mouth daily.   Yes [provider]  XARELTO 15 MG TABS tablet Take 15 mg by mouth daily.  06/17/14  Yes [provider]    Family History Family History  Problem Relation Age of Onset  . COPD Mother   . Heart disease Mother   . Diabetes Father   . Heart disease Father   . Diabetes Sister   . Heart  disease Brother   . Heart disease Brother   . Breast cancer Maternal Aunt   . Diabetes Paternal Grandmother   . Colon cancer Maternal Aunt   . Ovarian cancer Daughter     Social History Social History   Tobacco Use  . Smoking status: Never Smoker  . Smokeless tobacco: Never Used  Substance Use Topics  . Alcohol use: No    Alcohol/week: 0.0 standard drinks  . Drug use: No     Allergies   Adhesive [tape]   Review of Systems Review of Systems  All other systems reviewed and are negative.    Physical Exam Updated Vital Signs BP 131/61   Pulse (!) 107   Temp 99.8 F (37.7 C) (Oral)   Resp (!) 29   Ht 5\' 10"  (1.778 m)   Wt (!) 152 kg   SpO2 92%   BMI 48.08 kg/m   Physical Exam Vitals signs and nursing note reviewed.  HENT:     Head: Normocephalic and atraumatic.  Neck:     Musculoskeletal: Normal range of motion.  Cardiovascular:     Rate and Rhythm: Regular rhythm. Tachycardia present.  Pulmonary:     Effort: Pulmonary effort is normal. Tachypnea present. No accessory muscle usage or respiratory distress.     Breath sounds: Normal breath sounds.  Abdominal:     General: There is no distension.     Tenderness: There is no abdominal tenderness.  Musculoskeletal: Normal range of  motion.  Skin:    General: Skin is warm and dry.  Neurological:     Mental Status: He is alert and oriented to person, place, and time.      ED Treatments / Results  Labs (all labs ordered are listed, but only abnormal results are displayed) Labs Reviewed  SARS CORONAVIRUS 2 ([place] [place] [facility]) - Abnormal; Notable for the following components:      Result Value   SARS Coronavirus 2 POSITIVE (*)    All other components within normal limits  LACTIC ACID, PLASMA - Abnormal; Notable for the following components:   Lactic Acid, Venous 2.0 (*)    All other components within normal limits  CBC WITH DIFFERENTIAL/PLATELET - Abnormal; Notable for the following components:   RDW 17.2 (*)    All other components within normal limits  COMPREHENSIVE METABOLIC PANEL - Abnormal; Notable for the following components:   Glucose, Bld 237 (*)    BUN 28 (*)    Creatinine, Ser 1.83 (*)    Calcium 8.1 (*)    Albumin 3.3 (*)    AST 48 (*)    GFR calc non Af Amer 34 (*)    GFR calc Af Amer 40 (*)    All other components within normal limits  D-DIMER, QUANTITATIVE (NOT AT [facility]) - Abnormal; Notable for the following components:   D-Dimer, Quant 1.85 (*)    All other components within normal limits  FIBRINOGEN - Abnormal; Notable for the following components:   Fibrinogen 648 (*)    All other components within normal limits  LACTATE DEHYDROGENASE - Abnormal; Notable for the following components:   LDH 233 (*)    All other components within normal limits  C-REACTIVE PROTEIN - Abnormal; Notable for the following components:   CRP 7.0 (*)    All other components within normal limits  CULTURE, BLOOD (ROUTINE X 2)  CULTURE, BLOOD (ROUTINE X 2)  PROCALCITONIN  TRIGLYCERIDES  FERRITIN  LACTIC ACID, PLASMA  EKG EKG Interpretation  Date/Time:  Thursday Nov 15 2018 20:28:38 EDT Ventricular Rate:  107 PR Interval:    QRS Duration: 90 QT Interval:  365 QTC  Calculation: 487 R Axis:   68 Text Interpretation:  Atrial fibrillation Ventricular premature complex Anterior infarct, old No significant change was found Confirmed by Jola Schmidt [phone]) on 11/15/2018 9:25:56 PM   Radiology Dg Chest Port 1 View  Result Date: 11/15/2018 CLINICAL DATA:  Shortness of breath, cough. EXAM: PORTABLE CHEST 1 VIEW COMPARISON:  Radiograph of November 05, 2017. CT scan of November 06, 2017. FINDINGS: Stable cardiomegaly. No pneumothorax or pleural effusion is noted. Mildly increased bibasilar opacities are noted concerning for atelectasis or infiltrates. Bony thorax is unremarkable. IMPRESSION: Mild bibasilar opacities are noted concerning for subsegmental atelectasis or pneumonia. Electronically Signed   By: Marijo Conception M.D.   On: 11/15/2018 18:45    Procedures .Critical Care Performed by: Jola Schmidt, MD Authorized by: Jola Schmidt, MD   Critical care provider statement:    Critical care time (minutes):  31   Critical care was time spent personally by me on the following activities:  Discussions with consultants, evaluation of patient's response to treatment, examination of patient, ordering and performing treatments and interventions, ordering and review of laboratory studies, ordering and review of radiographic studies, pulse oximetry, re-evaluation of patient's condition, obtaining history from patient or surrogate and review of old charts   (including critical care time)  Medications Ordered in ED Medications  vancomycin (VANCOCIN) IVPB 1000 mg/200 mL premix (1,000 mg Intravenous New Bag/Given 11/15/18 2039)  ceFEPIme (MAXIPIME) 2 g in sodium chloride 0.9 % 100 mL IVPB (0 g Intravenous Stopped 11/15/18 2035)     Initial Impression / Assessment and Plan / ED Course  I have reviewed the triage vital signs and the nursing notes.  Pertinent labs & imaging results that were available during my care of the patient were reviewed by me and considered in my medical  decision making (see chart for details).        Worsening symptoms today with new reports of hypoxia although tolerating nasal cannula at this time.  Patient will be admitted to hospital for IV antibiotics and treatment of his coronavirus.  Final Clinical Impressions(s) / ED Diagnoses   Final diagnoses:  Community acquired pneumonia, unspecified laterality  COVID-19    ED Discharge Orders    None       Jola Schmidt, MD 11/15/18 2232    Jola Schmidt, MD 11/15/18 6759    Jola Schmidt, MD 11/25/18 2337

## 2018-11-15 NOTE — H&P (Signed)
History and Physical    JAHZION BROGDEN WUJ:[id number] DOB: 03/21/1936 DOA: 11/15/2018  PCP: Guadlupe Spanish, MD Patient coming from: [place]  I have personally briefly reviewed patient's old medical records in [place]  Chief Complaint: Shortness of breath, fever  HPI: THOM OLLINGER is a 83 y.o. male with medical history significant for atrial fibrillation, type 2 diabetes mellitus, essential hypertension, HLD, morbid obesity, chronic venous stasis with chronic ulcers who presents to [facility] long ED from SNF with reported shortness of breath and fever.  Per EMS report, noted to be saturating in the mid 80s, initially requiring nonrebreather.  Patient's states has felt slightly more short of breath today.  Although denied fever even with temperature noted to be 1-1.5 on ED presentation.  Patient reports wears occasional supplemental oxygen at home.  He reports has been at the nursing facility for roughly 1 year.  He states is mostly sedentary and utilizes a wheelchair mostly for ambulation.  Denies any recent medication changes, no recent contacts and reported recent COVID-19- test performed at SNF.  Currently requesting some to eat, drink, and have his telemetry needs removed.  No other specific complaints at this time.  Patient denies headache, no current fever/chills/night sweats, no nausea/vomiting/diarrhea, no chest pain, no palpitations, no cough/congestion, no abdominal pain, no worsening weakness/fatigue, no paresthesias.  ED Course: Temperature 99.8 (101.5), HR 107, RR 29, BP 131/61, SPO2 92% on 4 L nasal cannula (states uses O2 occasionally).  WBC 9.4, hemoglobin 14.0, sodium 136, potassium 3.5, BUN 28, creatinine 1.83, glucose 237.  LDH 233, ferritin 181, CRP 7.0, lactic acid 2.0, procalcitonin 0.13.  EKG notable for A. fib with rate 107, QTc 47 no concerning ST elevation/depression.  D-dimer 1.85, fibrinogen 648.  Chest x-ray notable with mild bibasilar opacities  consistent with subsegmental atelectasis versus pneumonia.  COVID-19/SARS-CoV-2 positive.  Review of Systems: As per HPI otherwise 10 point review of systems negative.    Past Medical History:  Diagnosis Date  . Anteroseptal myocardial infarction ([place])   . Atrial fibrillation (HCC)    a. chronic, on Xarelto  . CHF (congestive heart failure) ([place])   . Chronic kidney disease   . COPD (chronic obstructive pulmonary disease) ([place])   . Coronary atherosclerosis of unspecified type of vessel, native or graft    a. s/p prior PCI to LAD in 1997  . Diverticulosis   . Glaucoma   . Hypercholesteremia   . Hypertensive renal disease   . Hypokalemia   . Obesity hypoventilation syndrome ([place])   . Obesity, unspecified   . OSA (obstructive sleep apnea)   . Rhabdomyolysis   . Type II or unspecified type diabetes mellitus without mention of complication, not stated as uncontrolled   . Unspecified essential hypertension   . Vestibulopathy     Past Surgical History:  Procedure Laterality Date  . APPENDECTOMY  1985  . CARDIAC CATHETERIZATION    . CHOLECYSTECTOMY  06/2000  . CORONARY ANGIOPLASTY  08/14/1995   stent placement to LAD   . ENDOVENOUS ABLATION SAPHENOUS VEIN W/ LASER Left 02/21/2018   endovenous laser ablation L GSV by Ruta Hinds MD   . [place] Right 1985  . KNEE ARTHROSCOPY Left 1991  . LUMBAR FUSION    . NOSE SURGERY  1970s   Per Dr. Terrence Dupont [facility] in pt chart     reports that he has never smoked. He has never used smokeless tobacco. He reports that he does not drink alcohol  or use drugs.  Allergies  Allergen Reactions  . Adhesive [Tape] Rash    Family History  Problem Relation Age of Onset  . COPD Mother   . Heart disease Mother   . Diabetes Father   . Heart disease Father   . Diabetes Sister   . Heart disease Brother   . Heart disease Brother   . Breast cancer Maternal Aunt   . Diabetes Paternal Grandmother   . Colon cancer Maternal Aunt   .  Ovarian cancer Daughter     Prior to Admission medications   Medication Sig Start Date End Date Taking? Authorizing Provider  acetaminophen (TYLENOL) 325 MG tablet Take 650 mg by mouth every 4 (four) hours as needed for mild pain.   Yes [provider]  amLODipine (NORVASC) 10 MG tablet Take 10 mg by mouth daily.   Yes [provider]  atorvastatin (LIPITOR) 20 MG tablet Take 20 mg by mouth at bedtime.    Yes [provider]  cetirizine (ZYRTEC) 10 MG tablet Take 10 mg by mouth at bedtime.   Yes [provider]  cholecalciferol (VITAMIN D) 1000 UNITS tablet Take 1,000 Units by mouth daily.   Yes [provider]  docusate sodium (COLACE) 100 MG capsule Take 100 mg by mouth at bedtime.   Yes [provider]  Dulaglutide (TRULICITY) 1.88 CZ/6.6AY SOPN Inject 0.5 mLs into the skin every Monday.   Yes [provider]  escitalopram (LEXAPRO) 5 MG tablet Take 5 mg by mouth daily.   Yes [provider]  gabapentin (NEURONTIN) 100 MG capsule Take 100 mg by mouth 3 (three) times daily.   Yes [provider]  glipiZIDE (GLUCOTROL XL) 5 MG 24 hr tablet Take 5 mg by mouth daily with breakfast.   Yes [provider]  latanoprost (XALATAN) 0.005 % ophthalmic solution Place 1 drop into both eyes at bedtime. 01/03/17  Yes [provider]  Magnesium 400 MG TABS Take 400 mg by mouth daily.   Yes [provider]  Melatonin 5 MG TABS Take 5 mg by mouth at bedtime.   Yes [provider]  Menthol, Topical Analgesic, 4.5 % GEL Apply 1 application topically 2 (two) times daily as needed (pain). Apply to forehead and left arm   Yes [provider]  metoprolol tartrate (LOPRESSOR) 25 MG tablet Take 1 tablet (25 mg total) by mouth 2 (two) times daily. Patient taking differently: Take 25 mg by mouth daily.  05/18/15  Yes Charolette Forward, MD  nitroGLYCERIN (NITROSTAT) 0.4 MG SL tablet Place 0.4 mg under  the tongue every 5 (five) minutes as needed for chest pain.   Yes [provider]  Potassium Chloride ER 20 MEQ TBCR Take 20 mEq by mouth daily.   Yes [provider]  silver sulfADIAZINE (SILVADENE) 1 % cream Apply 1 application topically every other day. Apply to bilateral lower legs prior to re-wrapping   Yes [provider]  Skin Protectants, Misc. (EUCERIN) cream Apply 1 application topically 2 (two) times a day.   Yes [provider]  torsemide (DEMADEX) 20 MG tablet Take 40 mg by mouth daily.   Yes [provider]  TRADJENTA 5 MG TABS tablet Take 5 mg by mouth daily. 10/26/18  Yes [provider]  vitamin B-12 (CYANOCOBALAMIN) 1000 MCG tablet Take 1,000 mcg by mouth daily.   Yes [provider]  vitamin C (ASCORBIC ACID) 500 MG tablet Take 1,000 mg by mouth daily.  Yes [provider]  XARELTO 15 MG TABS tablet Take 15 mg by mouth daily.  06/17/14  Yes [provider]    Physical Exam: Vitals:   11/15/18 1807 11/15/18 1808 11/15/18 2008 11/15/18 2040  BP: (!) 144/58  131/61   Pulse: (!) 119  (!) 106 (!) 107  Resp: (!) 28  (!) 25 (!) 29  Temp: (!) 101.5 F (38.6 C)  99.8 F (37.7 C)   TempSrc: Oral  Oral   SpO2: 98%  92% 92%  Weight:  (!) 152 kg    Height:  5\' 10"  (1.778 m)      Constitutional: NAD, calm, comfortable Vitals:   11/15/18 1807 11/15/18 1808 11/15/18 2008 11/15/18 2040  BP: (!) 144/58  131/61   Pulse: (!) 119  (!) 106 (!) 107  Resp: (!) 28  (!) 25 (!) 29  Temp: (!) 101.5 F (38.6 C)  99.8 F (37.7 C)   TempSrc: Oral  Oral   SpO2: 98%  92% 92%  Weight:  (!) 152 kg    Height:  5\' 10"  (1.778 m)     Eyes: PERRL, lids and conjunctivae normal ENMT: Mucous membranes are moist. Posterior pharynx clear of any exudate or lesions.poor dentition..  Neck: normal, supple, no masses, no thyromegaly Respiratory: Slightly decreased breath sounds bilateral bases with crackles, no wheezes,  normal respiratory effort, no accessory muscle use, on 4 L nasal cannula oxygenating 92%. Cardiovascular: Tachycardic, irregularly irregular rhythm, no murmurs / rubs / gallops.  1+ pitting edema bilaterally to mid shin. Abdomen: no tenderness, no masses palpated. No hepatosplenomegaly. Bowel sounds positive.  Musculoskeletal: Compression wraps in place bilateral lower extremities Skin: no rashes, lesions, ulcers. No induration Neurologic: CN 2-12 grossly intact. Sensation intact, DTR normal. Strength 5/5 in all 4.  Psychiatric: Normal judgment and insight. Alert and oriented x 3. Normal mood.    Labs on Admission: I have personally reviewed following labs and imaging studies  CBC: Recent Labs  Lab 11/15/18 1901  WBC 9.4  NEUTROABS 7.4  HGB 14.0  HCT 44.0  MCV 85.4  PLT 163   Basic Metabolic Panel: Recent Labs  Lab 11/15/18 2007  NA 136  K 3.5  CL 99  CO2 24  GLUCOSE 237*  BUN 28*  CREATININE 1.83*  CALCIUM 8.1*   GFR: Estimated Creatinine Clearance: 47.6 mL/min (A) (by C-G formula based on SCr of 1.83 mg/dL (H)). Liver Function Tests: Recent Labs  Lab 11/15/18 2007  AST 48*  ALT 33  ALKPHOS 68  BILITOT 0.7  PROT 6.6  ALBUMIN 3.3*   No results for input(s): LIPASE, AMYLASE in the last 168 hours. No results for input(s): AMMONIA in the last 168 hours. Coagulation Profile: No results for input(s): INR, PROTIME in the last 168 hours. Cardiac Enzymes: No results for input(s): CKTOTAL, CKMB, CKMBINDEX, TROPONINI in the last 168 hours. BNP (last 3 results) No results for input(s): PROBNP in the last 8760 hours. HbA1C: No results for input(s): HGBA1C in the last 72 hours. CBG: No results for input(s): GLUCAP in the last 168 hours. Lipid Profile: Recent Labs    11/15/18 2007  TRIG 59   Thyroid Function Tests: No results for input(s): TSH, T4TOTAL, FREET4, T3FREE, THYROIDAB in the last 72 hours. Anemia Panel: Recent Labs    11/15/18 2008  FERRITIN 181    Urine analysis:    Component Value Date/Time   COLORURINE YELLOW 05/13/2015 1549   APPEARANCEUR TURBID (A) 05/13/2015 1549   LABSPEC 1.010  05/13/2015 1549   PHURINE 5.5 05/13/2015 1549   GLUCOSEU NEGATIVE 05/13/2015 1549   HGBUR LARGE (A) 05/13/2015 1549   BILIRUBINUR NEGATIVE 05/13/2015 1549   KETONESUR NEGATIVE 05/13/2015 1549   PROTEINUR 30 (A) 05/13/2015 1549   UROBILINOGEN 1.0 05/13/2015 1549   NITRITE NEGATIVE 05/13/2015 1549   LEUKOCYTESUR SMALL (A) 05/13/2015 1549    Radiological Exams on Admission: Dg Chest Port 1 View  Result Date: 11/15/2018 CLINICAL DATA:  Shortness of breath, cough. EXAM: PORTABLE CHEST 1 VIEW COMPARISON:  Radiograph of November 05, 2017. CT scan of November 06, 2017. FINDINGS: Stable cardiomegaly. No pneumothorax or pleural effusion is noted. Mildly increased bibasilar opacities are noted concerning for atelectasis or infiltrates. Bony thorax is unremarkable. IMPRESSION: Mild bibasilar opacities are noted concerning for subsegmental atelectasis or pneumonia. Electronically Signed   By: Marijo Conception M.D.   On: 11/15/2018 18:45    EKG: Independently reviewed.  Atrial fibrillation, rate 107, QTc 47, no concerning ST elevation/depressions  Assessment/Plan Principal Problem:   HCAP (healthcare-associated pneumonia) Active Problems:   Type 2 diabetes mellitus (HCC)   Atrial fibrillation (HCC)   Secondary DM with CKD stage 4 and hypertension (HCC)   Glaucoma   Diastolic dysfunction with chronic heart failure (HCC)   Blisters of multiple sites   Hyperlipidemia   Multiple open wounds of lower leg   Lymphedema   Venous stasis ulcers of both lower extremities ([place])   Acute respiratory disease due to COVID-19 virus  Acute hypoxic respiratory failure Healthcare associated pneumonia COVID-19/SARS-CoV-2 viral infection Patient presenting from his SNF with progressive shortness of breath and fever.  Found to have possible underlying pneumonia on chest x-ray as  well as a positive COVID-19 test.  Patient also with elevated d-dimer of 1.85, although pulmonary embolism very low suspicion as he is on chronic anticoagulation with Xarelto. --Admit inpatient, will transfer to [facility] campus --Check strep pneumo/Legionella urine antigen --Check rapid influenza, respiratory viral panel PCR --Check MRSA PCR --Antibiotics with vancomycin/cefepime; pharmacy consulted for dosing/monitoring --Start hydroxychloroquine, QTC 47; to monitor on telemetry with repeat EKG --Albuterol/ipratropium inhalers --Vitamin C and zinc --Antitussives --Hold on Remdesivir and Actemra for now unless respiratory status worsens --Supportive care, titrate supplemental oxygen to maintain SPO2 greater than 92%  Persistent atrial fibrillation --Continue rate control with metoprolol tartrate 25 mg p.o. daily --Continue chronic anticoagulation with Xarelto 50 mg daily, dosed for renal function --Continue to monitor on telemetry  Essential hypertension --BP well controlled, continue amlodipine 10 mg daily and metoprolol tartrate 25 mg daily  Chronic diastolic congestive heart failure Patient follows with cardiology, Dr. Rockey Situ outpatient. Last transthoracic echocardiogram 10/27/2016 55-60%, hypertrophy, mild MR, LA/RA mildly dilated. --Continue metoprolol tartrate 25 mg daily --Continue torsemide 20 mg daily with home potassium supplementation --Strict I's and O's and daily weights  Type 2 diabetes mellitus With an A1c 6.9 10/27/2016.  Home regimen includes Trulicity, glipizide 5 mg daily, and Tradjenta. --Update hemoglobin A1c --Will hold home medications --Lantus 10 units qHS --Moderate dose insulin sliding scale --CBG's qAC/HS --Heart healthy/consistent carbohydrate diet  Acute on CKD stage IV Cr on admission 1.83.  Last creatinine 1.27 on 01/04/2017 with baseline appearing between 1.27-1.34 over the past 2 years.  Etiology likely acute infectious process/prerenal azotemia.  --Gentle IV fluid hydration next 24 hours --Strict I's and O's and daily weights --Avoid nephrotoxins, renally dose all medications --BMP daily  Peripheral neuropathy: Continue gabapentin 100 mg p.o. 3 times daily  Glaucoma --Continue latanoprost eyedrops  Hyperlipidemia: Continue Lipitor 20  mg daily  Seasonal allergies: Continue oral antihistamine, with hospital substitution  Depression: Continue Lexapro 5 mg p.o. daily  Morbid obesity BMI 48.08.  Discussed with patient needs for aggressive weight loss measures as this complicates all facets of care.  Severity of Illness: The appropriate patient status for this patient is INPATIENT. Inpatient status is judged to be reasonable and necessary in order to provide the required intensity of service to ensure the patient's safety. The patient's presenting symptoms, physical exam findings, and initial radiographic and laboratory data in the context of their chronic comorbidities is felt to place them at high risk for further clinical deterioration. Furthermore, it is not anticipated that the patient will be medically stable for discharge from the hospital within 2 midnights of admission. The following factors support the patient status of inpatient.   " The patient's presenting symptoms include dyspnea and fever " The worrisome physical exam findings include crackles bilateral bases, new requirement of supplemental oxygen at 4 L per nasal cannula " The initial radiographic and laboratory data are worrisome because of chest x-ray with bilateral bibasilar opacities, COVID-19 test positive " The chronic co-morbidities include CHF, essential hypertension, atrial fibrillation, type 2 diabetes mellitus, hyperlipidemia.   * I certify that at the point of admission it is my clinical judgment that the patient will require inpatient hospital care spanning beyond 2 midnights from the point of admission due to high intensity of service, high risk for  further deterioration and high frequency of surveillance required.*  DVT prophylaxis: Xarelto Code Status: Full Code Family Communication: Attempted to call Jabes Primo son [phone], no answer Disposition Plan: Likely back to SNF when medically stable Consults called: None Admission status: Inpatient, [facility]   Khup Sapia J [place] DO [facility] Pager [phone]  If 7PM-7AM, please contact night-coverage [email or web address] Password [facility]  11/15/2018, 11:25 PM

## 2018-11-15 NOTE — Progress Notes (Signed)
A consult was received from an ED physician for Cefepime per pharmacy dosing.  The patient's profile has been reviewed for ht/wt/allergies/indication/available labs.   A one time order has been placed for Cefepime 2gm IV x1.  Further antibiotics/pharmacy consults should be ordered by admitting physician if indicated.                       Thank you, Biagio Borg 11/15/2018  7:02 PM

## 2018-11-15 NOTE — ED Notes (Signed)
Bed: [facility] Expected date:  Expected time:  Means of arrival:  Comments: EMS-SOB/fever-COVID eg

## 2018-11-16 ENCOUNTER — Inpatient Hospital Stay (HOSPITAL_COMMUNITY): Payer: Medicare Other

## 2018-11-16 DIAGNOSIS — J988 Other specified respiratory disorders: Secondary | ICD-10-CM

## 2018-11-16 DIAGNOSIS — E1122 Type 2 diabetes mellitus with diabetic chronic kidney disease: Secondary | ICD-10-CM

## 2018-11-16 DIAGNOSIS — I4811 Longstanding persistent atrial fibrillation: Secondary | ICD-10-CM

## 2018-11-16 DIAGNOSIS — J069 Acute upper respiratory infection, unspecified: Secondary | ICD-10-CM

## 2018-11-16 DIAGNOSIS — U071 COVID-19: Principal | ICD-10-CM

## 2018-11-16 DIAGNOSIS — N183 Chronic kidney disease, stage 3 (moderate): Secondary | ICD-10-CM

## 2018-11-16 LAB — CBC WITH DIFFERENTIAL/PLATELET
Abs Immature Granulocytes: 0.01 K/uL (ref 0.00–0.07)
Basophils Absolute: 0 K/uL (ref 0.0–0.1)
Basophils Relative: 0 %
Eosinophils Absolute: 0 K/uL (ref 0.0–0.5)
Eosinophils Relative: 0 %
HCT: 42.2 % (ref 39.0–52.0)
Hemoglobin: 13 g/dL (ref 13.0–17.0)
Immature Granulocytes: 0 %
Lymphocytes Relative: 13 %
Lymphs Abs: 0.8 K/uL (ref 0.7–4.0)
MCH: 26.5 pg (ref 26.0–34.0)
MCHC: 30.8 g/dL (ref 30.0–36.0)
MCV: 86.1 fL (ref 80.0–100.0)
Monocytes Absolute: 0.8 K/uL (ref 0.1–1.0)
Monocytes Relative: 12 %
Neutro Abs: 4.6 K/uL (ref 1.7–7.7)
Neutrophils Relative %: 75 %
Platelets: 179 K/uL (ref 150–400)
RBC: 4.9 MIL/uL (ref 4.22–5.81)
RDW: 17.1 % — ABNORMAL HIGH (ref 11.5–15.5)
WBC: 6.3 K/uL (ref 4.0–10.5)
nRBC: 0 % (ref 0.0–0.2)

## 2018-11-16 LAB — GLUCOSE, CAPILLARY
Glucose-Capillary: 139 mg/dL — ABNORMAL HIGH (ref 70–99)
Glucose-Capillary: 170 mg/dL — ABNORMAL HIGH (ref 70–99)
Glucose-Capillary: 166 mg/dL — ABNORMAL HIGH (ref 70–99)
Glucose-Capillary: 158 mg/dL — ABNORMAL HIGH (ref 70–99)
Glucose-Capillary: 136 mg/dL — ABNORMAL HIGH (ref 70–99)

## 2018-11-16 LAB — INFLUENZA PANEL BY PCR (TYPE A & B)
Influenza A By PCR: NEGATIVE
Influenza B By PCR: NEGATIVE

## 2018-11-16 LAB — RESPIRATORY PANEL BY PCR

## 2018-11-16 LAB — FERRITIN: Ferritin: 241 ng/mL (ref 24–336)

## 2018-11-16 LAB — COMPREHENSIVE METABOLIC PANEL
ALT: 29 U/L (ref 0–44)
AST: 52 U/L — ABNORMAL HIGH (ref 15–41)
Albumin: 3 g/dL — ABNORMAL LOW (ref 3.5–5.0)
Alkaline Phosphatase: 58 U/L (ref 38–126)
Anion gap: 11 (ref 5–15)
BUN: 30 mg/dL — ABNORMAL HIGH (ref 8–23)
CO2: 26 mmol/L (ref 22–32)
Calcium: 7.9 mg/dL — ABNORMAL LOW (ref 8.9–10.3)
Chloride: 102 mmol/L (ref 98–111)
Creatinine, Ser: 1.73 mg/dL — ABNORMAL HIGH (ref 0.61–1.24)
GFR calc Af Amer: 42 mL/min — ABNORMAL LOW (ref >60)
GFR calc non Af Amer: 36 mL/min — ABNORMAL LOW (ref >60)
Glucose, Bld: 164 mg/dL — ABNORMAL HIGH (ref 70–99)
Potassium: 3.2 mmol/L — ABNORMAL LOW (ref 3.5–5.1)
Sodium: 139 mmol/L (ref 135–145)
Total Bilirubin: 0.7 mg/dL (ref 0.3–1.2)
Total Protein: 6.1 g/dL — ABNORMAL LOW (ref 6.5–8.1)

## 2018-11-16 LAB — LACTIC ACID, PLASMA
Lactic Acid, Venous: 1.5 mmol/L (ref 0.5–1.9)
Lactic Acid, Venous: 1.2 mmol/L (ref 0.5–1.9)

## 2018-11-16 LAB — PHOSPHORUS: Phosphorus: 2.8 mg/dL (ref 2.5–4.6)

## 2018-11-16 LAB — C-REACTIVE PROTEIN: CRP: 8.5 mg/dL — ABNORMAL HIGH

## 2018-11-16 LAB — MRSA PCR SCREENING: MRSA by PCR: POSITIVE — AB

## 2018-11-16 LAB — STREP PNEUMONIAE URINARY ANTIGEN: Strep Pneumo Urinary Antigen: NEGATIVE

## 2018-11-16 LAB — HEMOGLOBIN A1C
Hgb A1c MFr Bld: 8.3 % — ABNORMAL HIGH (ref 4.8–5.6)
Mean Plasma Glucose: 191.51 mg/dL

## 2018-11-16 LAB — ABO/RH: ABO/RH(D): O POS

## 2018-11-16 LAB — TRIGLYCERIDES: Triglycerides: 109 mg/dL

## 2018-11-16 LAB — MAGNESIUM: Magnesium: 1.9 mg/dL (ref 1.7–2.4)

## 2018-11-16 MED ORDER — IPRATROPIUM BROMIDE HFA 17 MCG/ACT IN AERS
2.0000 | INHALATION_SPRAY | Freq: Four times a day (QID) | RESPIRATORY_TRACT | Status: DC
  Administered 2018-11-16 (×3): 2 via RESPIRATORY_TRACT
  Filled 2018-11-16: qty 12.9

## 2018-11-16 MED ORDER — HYDROCODONE-ACETAMINOPHEN 5-325 MG PO TABS
1.0000 | ORAL_TABLET | ORAL | Status: DC | PRN
  Administered 2018-11-20: 2 via ORAL
  Filled 2018-11-16: qty 2

## 2018-11-16 MED ORDER — INSULIN ASPART 100 UNIT/ML ~~LOC~~ SOLN
0.0000 [IU] | Freq: Three times a day (TID) | SUBCUTANEOUS | Status: DC
  Administered 2018-11-16 (×3): 3 [IU] via SUBCUTANEOUS
  Administered 2018-11-17: 2 [IU] via SUBCUTANEOUS
  Administered 2018-11-17: 5 [IU] via SUBCUTANEOUS
  Administered 2018-11-18 (×2): 11 [IU] via SUBCUTANEOUS

## 2018-11-16 MED ORDER — GUAIFENESIN-DM 100-10 MG/5ML PO SYRP
10.0000 mL | ORAL_SOLUTION | ORAL | Status: DC | PRN
  Administered 2018-11-18: 08:00:00 10 mL via ORAL
  Filled 2018-11-16: qty 10

## 2018-11-16 MED ORDER — ATORVASTATIN CALCIUM 10 MG PO TABS
20.0000 mg | ORAL_TABLET | Freq: Every day | ORAL | Status: DC
  Administered 2018-11-16 – 2018-11-26 (×11): 20 mg via ORAL
  Filled 2018-11-16 (×11): qty 2

## 2018-11-16 MED ORDER — MUPIROCIN 2 % EX OINT
1.0000 "application " | TOPICAL_OINTMENT | Freq: Two times a day (BID) | CUTANEOUS | Status: AC
  Administered 2018-11-16 – 2018-11-20 (×10): 1 via NASAL
  Filled 2018-11-16: qty 22

## 2018-11-16 MED ORDER — TOCILIZUMAB 400 MG/20ML IV SOLN
800.0000 mg | Freq: Once | INTRAVENOUS | Status: AC
  Administered 2018-11-16: 800 mg via INTRAVENOUS
  Filled 2018-11-16: qty 40

## 2018-11-16 MED ORDER — VITAMIN C 500 MG PO TABS
500.0000 mg | ORAL_TABLET | Freq: Every day | ORAL | Status: DC
  Administered 2018-11-16 – 2018-11-27 (×12): 500 mg via ORAL
  Filled 2018-11-16 (×12): qty 1

## 2018-11-16 MED ORDER — LATANOPROST 0.005 % OP SOLN
1.0000 [drp] | Freq: Every day | OPHTHALMIC | Status: DC
  Administered 2018-11-17 – 2018-11-26 (×8): 1 [drp] via OPHTHALMIC
  Filled 2018-11-16: qty 2.5

## 2018-11-16 MED ORDER — INSULIN ASPART 100 UNIT/ML ~~LOC~~ SOLN
0.0000 [IU] | Freq: Every day | SUBCUTANEOUS | Status: DC
  Administered 2018-11-17: 21:00:00 3 [IU] via SUBCUTANEOUS

## 2018-11-16 MED ORDER — HYDROXYCHLOROQUINE SULFATE 200 MG PO TABS
400.0000 mg | ORAL_TABLET | Freq: Two times a day (BID) | ORAL | Status: DC
  Administered 2018-11-16: 400 mg via ORAL
  Filled 2018-11-16: qty 2

## 2018-11-16 MED ORDER — METOPROLOL TARTRATE 25 MG PO TABS
25.0000 mg | ORAL_TABLET | Freq: Every day | ORAL | Status: DC
  Administered 2018-11-16 – 2018-11-27 (×12): 25 mg via ORAL
  Filled 2018-11-16 (×13): qty 1

## 2018-11-16 MED ORDER — ONDANSETRON HCL 4 MG PO TABS
4.0000 mg | ORAL_TABLET | Freq: Four times a day (QID) | ORAL | Status: DC | PRN
  Filled 2018-11-16: qty 1

## 2018-11-16 MED ORDER — ESCITALOPRAM OXALATE 5 MG PO TABS
5.0000 mg | ORAL_TABLET | Freq: Every day | ORAL | Status: DC
  Administered 2018-11-16 – 2018-11-21 (×6): 5 mg via ORAL
  Filled 2018-11-16 (×8): qty 1

## 2018-11-16 MED ORDER — SENNOSIDES-DOCUSATE SODIUM 8.6-50 MG PO TABS: 1.0000 | ORAL_TABLET | Freq: Every evening | ORAL | Status: DC | PRN

## 2018-11-16 MED ORDER — IPRATROPIUM BROMIDE HFA 17 MCG/ACT IN AERS
2.0000 | INHALATION_SPRAY | Freq: Four times a day (QID) | RESPIRATORY_TRACT | Status: DC | PRN
  Filled 2018-11-16: qty 12.9

## 2018-11-16 MED ORDER — HYDROCOD POLST-CPM POLST ER 10-8 MG/5ML PO SUER: 5.0000 mL | Freq: Two times a day (BID) | ORAL | Status: DC | PRN

## 2018-11-16 MED ORDER — ESCITALOPRAM OXALATE 5 MG PO TABS
5.0000 mg | ORAL_TABLET | Freq: Every day | ORAL | Status: DC
  Filled 2018-11-16: qty 1

## 2018-11-16 MED ORDER — MELATONIN 5 MG PO TABS
5.0000 mg | ORAL_TABLET | Freq: Every day | ORAL | Status: DC
  Administered 2018-11-16 – 2018-11-26 (×11): 5 mg via ORAL
  Filled 2018-11-16 (×12): qty 1

## 2018-11-16 MED ORDER — POTASSIUM CHLORIDE CRYS ER 20 MEQ PO TBCR
20.0000 meq | EXTENDED_RELEASE_TABLET | Freq: Every day | ORAL | Status: DC
  Administered 2018-11-16 – 2018-11-23 (×8): 20 meq via ORAL
  Filled 2018-11-16: qty 1
  Filled 2018-11-16: qty 2
  Filled 2018-11-16 (×7): qty 1

## 2018-11-16 MED ORDER — ONDANSETRON HCL 4 MG/2ML IJ SOLN: 4.0000 mg | Freq: Four times a day (QID) | INTRAMUSCULAR | Status: DC | PRN

## 2018-11-16 MED ORDER — GABAPENTIN 100 MG PO CAPS
100.0000 mg | ORAL_CAPSULE | Freq: Three times a day (TID) | ORAL | Status: DC
  Administered 2018-11-16 – 2018-11-27 (×34): 100 mg via ORAL
  Filled 2018-11-16 (×39): qty 1

## 2018-11-16 MED ORDER — ZINC SULFATE 220 (50 ZN) MG PO CAPS
220.0000 mg | ORAL_CAPSULE | Freq: Every day | ORAL | Status: DC
  Administered 2018-11-16 – 2018-11-27 (×12): 220 mg via ORAL
  Filled 2018-11-16 (×13): qty 1

## 2018-11-16 MED ORDER — ACETAMINOPHEN 325 MG PO TABS
650.0000 mg | ORAL_TABLET | Freq: Four times a day (QID) | ORAL | Status: DC | PRN
  Administered 2018-11-16 – 2018-11-26 (×4): 650 mg via ORAL
  Filled 2018-11-16 (×5): qty 2

## 2018-11-16 MED ORDER — LORATADINE 10 MG PO TABS
10.0000 mg | ORAL_TABLET | Freq: Every day | ORAL | Status: DC
  Administered 2018-11-16 – 2018-11-27 (×12): 10 mg via ORAL
  Filled 2018-11-16 (×14): qty 1

## 2018-11-16 MED ORDER — INSULIN GLARGINE 100 UNIT/ML ~~LOC~~ SOLN
10.0000 [IU] | Freq: Every day | SUBCUTANEOUS | Status: DC
  Administered 2018-11-16 – 2018-11-17 (×2): 10 [IU] via SUBCUTANEOUS
  Filled 2018-11-16 (×2): qty 0.1

## 2018-11-16 MED ORDER — HYDROXYCHLOROQUINE SULFATE 200 MG PO TABS: 200.0000 mg | ORAL_TABLET | Freq: Two times a day (BID) | ORAL | Status: DC

## 2018-11-16 MED ORDER — FAMOTIDINE 20 MG PO TABS
20.0000 mg | ORAL_TABLET | Freq: Every day | ORAL | Status: DC
  Administered 2018-11-16 – 2018-11-27 (×12): 20 mg via ORAL
  Filled 2018-11-16 (×12): qty 1

## 2018-11-16 MED ORDER — CHLORHEXIDINE GLUCONATE CLOTH 2 % EX PADS
6.0000 | MEDICATED_PAD | Freq: Every day | CUTANEOUS | Status: AC
  Administered 2018-11-16 – 2018-11-20 (×5): 6 via TOPICAL

## 2018-11-16 MED ORDER — TORSEMIDE 20 MG PO TABS
40.0000 mg | ORAL_TABLET | Freq: Every day | ORAL | Status: DC
  Administered 2018-11-16 – 2018-11-19 (×4): 40 mg via ORAL
  Filled 2018-11-16 (×4): qty 2

## 2018-11-16 MED ORDER — POTASSIUM CHLORIDE CRYS ER 20 MEQ PO TBCR
20.0000 meq | EXTENDED_RELEASE_TABLET | Freq: Once | ORAL | Status: AC
  Administered 2018-11-16: 20 meq via ORAL
  Filled 2018-11-16: qty 1

## 2018-11-16 MED ORDER — SODIUM CHLORIDE 0.9 % IV SOLN
INTRAVENOUS | Status: DC
  Administered 2018-11-16: 05:00:00 via INTRAVENOUS

## 2018-11-16 MED ORDER — MAGNESIUM CITRATE PO SOLN: 1.0000 | Freq: Once | ORAL | Status: DC | PRN

## 2018-11-16 MED ORDER — AMLODIPINE BESYLATE 5 MG PO TABS
10.0000 mg | ORAL_TABLET | Freq: Every day | ORAL | Status: DC
  Administered 2018-11-16 – 2018-11-27 (×12): 10 mg via ORAL
  Filled 2018-11-16 (×12): qty 2

## 2018-11-16 MED ORDER — RIVAROXABAN 15 MG PO TABS
15.0000 mg | ORAL_TABLET | Freq: Every day | ORAL | Status: DC
  Administered 2018-11-16 – 2018-11-27 (×12): 15 mg via ORAL
  Filled 2018-11-16 (×14): qty 1

## 2018-11-16 MED ORDER — MAGNESIUM OXIDE 400 (241.3 MG) MG PO TABS
400.0000 mg | ORAL_TABLET | Freq: Every day | ORAL | Status: DC
  Administered 2018-11-16 – 2018-11-27 (×12): 400 mg via ORAL
  Filled 2018-11-16 (×14): qty 1

## 2018-11-16 MED ORDER — BISACODYL 10 MG RE SUPP: 10.0000 mg | Freq: Every day | RECTAL | Status: DC | PRN

## 2018-11-16 MED ORDER — ORAL CARE MOUTH RINSE
15.0000 mL | Freq: Two times a day (BID) | OROMUCOSAL | Status: DC
  Administered 2018-11-16 – 2018-11-27 (×18): 15 mL via OROMUCOSAL

## 2018-11-16 NOTE — NC FL2 (Signed)
[place] LEVEL OF CARE SCREENING TOOL     IDENTIFICATION  Patient Name: Reginald [name] Birthdate: [birth year] Sex: male Admission Date (Current Location): 11/15/2018  [facility] and [place] Number:  Herbalist and Address:  [name], [place] [address] [place], [place] [address]      Provider Number: [id number]  Attending Physician Name and Address:  Bonnielee Haff, MD  Relative Name and Phone Number:  [name] (son) [phone]    Current Level of Care: Hospital Recommended Level of Care: [place] Prior Approval Number:    Date Approved/Denied: 05/12/06 PASRR Number: [id number]  Discharge Plan: SNF    Current Diagnoses: Patient Active Problem List   Diagnosis Date Noted  . HCAP (healthcare-associated pneumonia) 11/15/2018  . Acute respiratory disease due to COVID-19 virus 11/15/2018  . Chronic venous insufficiency 10/04/2017  . Venous stasis ulcers of both lower extremities ([place]) 10/04/2017  . Nonspecific chest pain 10/27/2016  . Morbid obesity ([place]) 05/02/2016  . Lymphedema 05/02/2016  . Abnormal laboratory test result 10/15/2015  . Cough 10/08/2015  . Hematuria 09/21/2015  . Hypokalemia 08/13/2015  . HTN (hypertension) 08/13/2015  . Multiple open wounds of lower leg 07/22/2015  . CKD (chronic kidney disease) stage 4, GFR 15-29 ml/min (HCC) 07/22/2015  . Cellulitis of leg, right 06/28/2015  . ARF (acute renal failure) ([place]) 06/28/2015  . Hypertensive heart disease with CHF (congestive heart failure) ([place]) 05/19/2015  . CAD (coronary artery disease), native coronary artery 05/19/2015  . Hyperlipidemia 05/19/2015  . Vitamin D deficiency 05/19/2015  . Pressure ulcer 05/16/2015  . Diastolic dysfunction with chronic heart failure ([place]) 05/08/2015  . Blisters of multiple sites 05/08/2015  . Type 2 diabetes mellitus ([place])   . Obesity, unspecified   . OSA (obstructive sleep apnea)   . Atrial  fibrillation ([place])   . Secondary DM with CKD stage 4 and hypertension ([place])   . Glaucoma   . Cellulitis 05/07/2015    Orientation RESPIRATION BLADDER Height & Weight     Self, Time, Situation, Place  O2(5 L/min nasal cannula) Incontinent, External catheter Weight: (!) 335 lb 1.6 oz (152 kg) Height:  5\' 10"  (177.8 cm)  BEHAVIORAL SYMPTOMS/MOOD NEUROLOGICAL BOWEL NUTRITION STATUS      Continent Diet(see discharge summary)  AMBULATORY STATUS COMMUNICATION OF NEEDS Skin   Limited Assist Verbally Other (Comment), PU Stage and Appropriate Care(MASD on breast, perineum, and abdome, stage II pressure ulcer on the buttocks)                       Personal Care Assistance Level of Assistance  Bathing, Feeding, Dressing, Total care(front wheel walker) Bathing Assistance: Limited assistance Feeding assistance: Independent Dressing Assistance: Limited assistance Total Care Assistance: Limited assistance   Functional Limitations Info  Sight, Hearing, Speech Sight Info: Impaired(wears glasses) Hearing Info: Adequate Speech Info: Adequate    SPECIAL CARE FACTORS FREQUENCY  PT (By licensed PT), OT (By licensed OT)     PT Frequency: min 5x weekly OT Frequency: min 5x weekly            Contractures Contractures Info: Not present    Additional Factors Info  Code Status, Allergies, Isolation Precautions Code Status Info: Full Allergies Info: Adhesive (tape)     Isolation Precautions Info: Air and contact precautions for emerging pathogen     Current Medications (11/16/2018):  This is the current hospital active medication list Current Facility-Administered Medications  Medication Dose Route Frequency  Provider Last Rate Last Dose  . acetaminophen (TYLENOL) tablet 650 mg  650 mg Oral Q6H PRN [place] Donnamarie Poag, DO      . amLODipine (NORVASC) tablet 10 mg  10 mg Oral Daily [place] Donnamarie Poag, DO   10 mg at 11/16/18 5176  . atorvastatin (LIPITOR) tablet 20 mg  20 mg Oral QHS [place] Donnamarie Poag, DO      . bisacodyl (DULCOLAX) suppository 10 mg  10 mg Rectal Daily PRN [place] Donnamarie Poag, DO      . Chlorhexidine Gluconate Cloth 2 % PADS 6 each  6 each Topical Q0600 Bonnielee Haff, MD      . chlorpheniramine-HYDROcodone (TUSSIONEX) 10-8 MG/5ML suspension 5 mL  5 mL Oral Q12H PRN [place] Eric J, DO      . escitalopram (LEXAPRO) tablet 5 mg  5 mg Oral Daily [place] Eric J, DO      . famotidine (PEPCID) tablet 20 mg  20 mg Oral Daily Bonnielee Haff, MD   20 mg at 11/16/18 1420  . gabapentin (NEURONTIN) capsule 100 mg  100 mg Oral TID [place] Eric J, DO   100 mg at 11/16/18 0944  . guaiFENesin-dextromethorphan (ROBITUSSIN DM) 100-10 MG/5ML syrup 10 mL  10 mL Oral Q4H PRN [place] Donnamarie Poag, DO      . HYDROcodone-acetaminophen (NORCO/VICODIN) 5-325 MG per tablet 1-2 tablet  1-2 tablet Oral Q4H PRN [place] Eric J, DO      . insulin aspart (novoLOG) injection 0-15 Units  0-15 Units Subcutaneous TID WC [place] Eric J, DO   3 Units at 11/16/18 1210  . insulin aspart (novoLOG) injection 0-5 Units  0-5 Units Subcutaneous QHS [place] Eric J, DO      . insulin glargine (LANTUS) injection 10 Units  10 Units Subcutaneous QHS [place] Eric J, DO      . ipratropium (ATROVENT HFA) inhaler 2 puff  2 puff Inhalation Q6H [place] Donnamarie Poag, DO   2 puff at 11/16/18 0910  . latanoprost (XALATAN) 0.005 % ophthalmic solution 1 drop  1 drop Both Eyes QHS [place] Donnamarie Poag, DO      . loratadine (CLARITIN) tablet 10 mg  10 mg Oral Daily [place] Donnamarie Poag, DO   10 mg at 11/16/18 0944  . magnesium citrate solution 1 Bottle  1 Bottle Oral Once PRN [place] Eric J, DO      . magnesium oxide (MAG-OX) tablet 400 mg  400 mg Oral Daily [place] Donnamarie Poag, DO   400 mg at 11/16/18 0944  . MEDLINE mouth rinse  15 mL Mouth Rinse BID Bonnielee Haff, MD   15 mL at 11/16/18 0945  . Melatonin TABS 5 mg  5 mg Oral QHS [place] Donnamarie Poag, DO      . metoprolol tartrate (LOPRESSOR) tablet 25 mg  25 mg Oral Daily [place] Donnamarie Poag, DO   25 mg at  11/16/18 1607  . mupirocin ointment (BACTROBAN) 2 % 1 application  1 application Nasal BID Bonnielee Haff, MD   1 application at 37/10/62 1210  . ondansetron (ZOFRAN) tablet 4 mg  4 mg Oral Q6H PRN [place] Donnamarie Poag, DO       Or  . ondansetron [facility]) injection 4 mg  4 mg Intravenous Q6H PRN [place] Eric J, DO      . potassium chloride SA (K-DUR) CR tablet 20 mEq  20 mEq Oral Daily [place] Donnamarie Poag, DO   20 mEq at 11/16/18 0908  . potassium chloride SA (K-DUR) CR tablet  20 mEq  20 mEq Oral Once Bonnielee Haff, MD      . Rivaroxaban [facility]) tablet 15 mg  15 mg Oral Q breakfast [place] Eric J, DO   15 mg at 11/16/18 4665  . senna-docusate (Senokot-S) tablet 1 tablet  1 tablet Oral QHS PRN [place] Donnamarie Poag, DO      . torsemide [facility]) tablet 40 mg  40 mg Oral Daily [place] Donnamarie Poag, DO   40 mg at 11/16/18 0944  . vitamin C (ASCORBIC ACID) tablet 500 mg  500 mg Oral Daily [place] Donnamarie Poag, DO   500 mg at 11/16/18 9935  . zinc sulfate capsule 220 mg  220 mg Oral Daily [place] Eric J, DO   220 mg at 11/16/18 7017     Discharge Medications: Please see discharge summary for a list of discharge medications.  Relevant Imaging Results:  Relevant Lab Results:   Additional Information SSN: [id number]  Alberteen Sam, LCSW

## 2018-11-16 NOTE — ED Notes (Signed)
Pt verbalized understanding and consent of transfer

## 2018-11-16 NOTE — Evaluation (Addendum)
Physical Therapy Evaluation Patient Details Name: Reginald [name] MRN: [id number] DOB: 07/03/1936 Today's Date: 11/16/2018   History of Present Illness  Pt adm with SOB and fever. Pt with PNA and COVID 19 positiive. PMH - morbid obesity, ckd, dm, chronic venous stasis, copd, chf, MI, afib, lumbar fusion  Clinical Impression  Pt admitted with above diagnosis and presents to PT with functional limitations due to deficits listed below (See PT problem list). Pt needs skilled PT to maximize independence and safety to allow discharge to back to SNF. Expect pt to make steady progress back to baseline with mobility.      Follow Up Recommendations SNF    Equipment Recommendations  None recommended by PT    Recommendations for Other Services       Precautions / Restrictions Precautions Precautions: Fall Restrictions Weight Bearing Restrictions: No      Mobility  Bed Mobility Overal bed mobility: Needs Assistance Bed Mobility: Supine to Sit     Supine to sit: Min assist     General bed mobility comments: Assist to bring rt leg off of bed, elevate trunk into sitting and bring hips to EOB.  Transfers Overall transfer level: Needs assistance Equipment used: Rolling walker (2 wheeled) Transfers: Sit to/from Stand Sit to Stand: Min assist         General transfer comment: Assist to bring hips up and for balance. Verbal cues for hand placement  Ambulation/Gait Ambulation/Gait assistance: Min guard Gait Distance (Feet): 15 Feet(x 2) Assistive device: Rolling walker (2 wheeled) Gait Pattern/deviations: Step-through pattern;Decreased stride length;Wide base of support Gait velocity: decr Gait velocity interpretation: <1.31 ft/sec, indicative of household ambulator General Gait Details: Assist for safety and lines. Pt amb on 6L of O2 with SpO2 89-91%.   Stairs            Wheelchair Mobility    Modified Rankin (Stroke Patients Only)       Balance Overall balance  assessment: Needs assistance Sitting-balance support: No upper extremity supported;Feet supported Sitting balance-Leahy Scale: Fair     Standing balance support: Bilateral upper extremity supported Standing balance-Leahy Scale: Poor Standing balance comment: walker and supervision for static standing                             Pertinent Vitals/Pain Pain Assessment: No/denies pain    Home Living Family/patient expects to be discharged to:: Skilled nursing facility                      Prior Function Level of Independence: Needs assistance   Gait / Transfers Assistance Needed: Pt modified independent with transfer to w/c and using w/c. Amb up to 100' with rolling walker and assist           Hand Dominance        Extremity/Trunk Assessment   Upper Extremity Assessment Upper Extremity Assessment: Defer to OT evaluation    Lower Extremity Assessment Lower Extremity Assessment: Generalized weakness       Communication   Communication: HOH  Cognition Arousal/Alertness: Awake/alert Behavior During Therapy: WFL for tasks assessed/performed Overall Cognitive Status: Within Functional Limits for tasks assessed                                        General Comments      Exercises  Assessment/Plan    PT Assessment Patient needs continued PT services  PT Problem List Decreased strength;Decreased activity tolerance;Decreased balance;Decreased mobility;Obesity       PT Treatment Interventions DME instruction;Gait training;Functional mobility training;Therapeutic activities;Therapeutic exercise;Balance training;Patient/family education    PT Goals (Current goals can be found in the Care Plan section)  Acute Rehab PT Goals Patient Stated Goal: Get up out of bed PT Goal Formulation: With patient Time For Goal Achievement: 11/30/18 Potential to Achieve Goals: Good    Frequency Min 3X/week   Barriers to discharge         Co-evaluation               AM-PAC PT "6 Clicks" Mobility  Outcome Measure Help needed turning from your back to your side while in a flat bed without using bedrails?: A Little Help needed moving from lying on your back to sitting on the side of a flat bed without using bedrails?: A Little Help needed moving to and from a bed to a chair (including a wheelchair)?: A Little Help needed standing up from a chair using your arms (e.g., wheelchair or bedside chair)?: A Little Help needed to walk in hospital room?: A Little Help needed climbing 3-5 steps with a railing? : Total 6 Click Score: 16    End of Session Equipment Utilized During Treatment: Gait belt;Oxygen Activity Tolerance: Patient tolerated treatment well Patient left: in chair;with call bell/phone within reach;with bed alarm set Nurse Communication: Mobility status PT Visit Diagnosis: Other abnormalities of gait and mobility (R26.89);Muscle weakness (generalized) (M62.81)    Time: 1000-1045 PT Time Calculation (min) (ACUTE ONLY): 45 min   Charges:   PT Evaluation $PT Eval Moderate Complexity: 1 Mod PT Treatments $Gait Training: 23-37 mins        [place] Pager [phone] Office [place] 11/16/2018, 11:02 AM

## 2018-11-16 NOTE — Progress Notes (Signed)
PROGRESS NOTE  Reginald [name] XBM:[id number] DOB: 05/06/1936 DOA: 11/15/2018  PCP: Guadlupe Spanish, MD  Brief History/Interval Summary: 83 y.o. male with medical history significant for atrial fibrillation, type 2 diabetes mellitus, essential hypertension, HLD, morbid obesity, chronic venous stasis with chronic ulcers who presented to [facility] long ED from SNF with reported shortness of breath and fever.  Per EMS report, noted to be saturating in the mid 80s, initially requiring nonrebreather.  Patient was noted to be febrile.  Chest x-ray showed bibasilar opacities.  Patient was hospitalized for further management.    Reason for Visit: Acute respiratory failure with hypoxia secondary to COVID-19  Consultants: None  Procedures: None  Antibiotics: Anti-infectives (From admission, onward)   Start     Dose/Rate Route Frequency Ordered Stop   11/17/18 1000  hydroxychloroquine (PLAQUENIL) tablet 200 mg     200 mg Oral 2 times daily 11/16/18 0420 11/21/18 0959   11/16/18 1000  hydroxychloroquine (PLAQUENIL) tablet 400 mg     400 mg Oral 2 times daily 11/16/18 0420 11/17/18 0959   11/15/18 2345  vancomycin (VANCOCIN) 1,500 mg in sodium chloride 0.9 % 500 mL IVPB     1,500 mg 250 mL/hr over 120 Minutes Intravenous  Once 11/15/18 2335 11/16/18 0205   11/15/18 1915  ceFEPIme (MAXIPIME) 2 g in sodium chloride 0.9 % 100 mL IVPB     2 g 200 mL/hr over 30 Minutes Intravenous  Once 11/15/18 1901 11/15/18 2035   11/15/18 1900  vancomycin (VANCOCIN) IVPB 1000 mg/200 mL premix     1,000 mg 200 mL/hr over 60 Minutes Intravenous  Once 11/15/18 1855 11/15/18 2240       Subjective/Interval History: Patient states that he is feeling slightly better this morning.  Less cough.  Less short of breath.  No chest pain.  No nausea or vomiting.    Assessment/Plan:  Acute Hypoxic Resp. Failure due to Acute Covid 19 Viral Illness during the ongoing 2020 Covid 19 Pandemic  COVID-19 Labs  Recent Labs     11/15/18 2007 11/15/18 2008 11/16/18 0745  DDIMER 1.85*  --   --   FERRITIN  --  181 241  LDH 233*  --   --   CRP  --  7.0* 8.5*    Lab Results  Component Value Date   SARSCOV2NAA POSITIVE (A) 11/15/2018     Last episode of fever: Temperature was 101.5 F on 5/7 Oxygen requirements: Currently on 5 L of oxygen by nasal cannula.  Saturating in the mid 90s. Antibiotics: Patient on vancomycin and cefepime.  Also started on hydroxychloroquine.  In view of recent reports that hydroxychloroquine may offer no benefit and may increase risk of side effects we will discontinue this medication. Steroids: None currently Diuretics: Torsemide continued at home dose Actemra: Not given yet Convalescent Plasma: Not indicated yet Vitamin C and zinc: Initiated   Prone positioning: Patient encouraged to stay in prone position as much as possible.  However he has limited mobility at baseline and he is obese and so prone positioning may be hard to do at this time. We will continue to follow clinically for now.  If his respiratory status worsens then we may have to do prone positioning at that time.  Can hold off for now.  Inflammatory markers and d-dimer as above.  Continue to trend.  Procalcitonin 0.13.  Lactic acid level was 2.0.  Patient was placed on vancomycin and cefepime.  Follow-up on cultures.  Respiratory viral panel was negative.  Strep pneumonia urinary antigen negative.  Influenza PCR negative.  MRSA PCR was noted to be positive.  Low threshold to discontinue IV antibiotics depending on culture data.  History of persistent atrial fibrillation Continue metoprolol for rate control.  Patient is chronically anticoagulated with Xarelto.  Acute on chronic kidney disease stage IV Creatinine was 1.83 on admission.  Last creatinine in our records is 1.27 in 2018.  Creatinine told noted to be slightly better this morning at 1.73.  Continue to monitor urine output.  Avoid nephrotoxins.  Essential  hypertension Continue home medications.  Monitor blood pressures closely.  Chronic diastolic CHF Followed by cardiology, Dr. Zella Richer, as an outpatient.  Last echocardiogram from 2018 showed normal systolic function at 55 to 60%.  Continue beta-blocker.  Patient is on torsemide which is being continued.  Diabetes mellitus type 2 HbA1c 6.9 in 2018.  Home medications held.  Lantus initiated.  Monitor CBGs.  SSI.  History of peripheral neuropathy Continue gabapentin  History of glaucoma Continue eyedrops  History of hyperlipidemia Continue home medications.  History of depression Continue Lexapro  Morbid obesity BMI 48.08.   DVT Prophylaxis: On Xarelto  Lab Results  Component Value Date   PLT 179 11/16/2018     PUD Prophylaxis: Add Pepcid Code Status: Full code Family Communication: Discussed with the patient Disposition Plan: Management as outlined above.  Patient is here from a skilled nursing facility.   Medications:  Scheduled: . amLODipine  10 mg Oral Daily  . atorvastatin  20 mg Oral QHS  . Chlorhexidine Gluconate Cloth  6 each Topical Q0600  . escitalopram  5 mg Oral Daily  . gabapentin  100 mg Oral TID  . insulin aspart  0-15 Units Subcutaneous TID WC  . insulin aspart  0-5 Units Subcutaneous QHS  . insulin glargine  10 Units Subcutaneous QHS  . ipratropium  2 puff Inhalation Q6H  . latanoprost  1 drop Both Eyes QHS  . loratadine  10 mg Oral Daily  . magnesium oxide  400 mg Oral Daily  . mouth rinse  15 mL Mouth Rinse BID  . Melatonin  5 mg Oral QHS  . metoprolol tartrate  25 mg Oral Daily  . mupirocin ointment  1 application Nasal BID  . potassium chloride SA  20 mEq Oral Daily  . Rivaroxaban  15 mg Oral Q breakfast  . torsemide  40 mg Oral Daily  . vitamin C  500 mg Oral Daily  . zinc sulfate  220 mg Oral Daily   Continuous: . sodium chloride 75 mL/hr at 11/16/18 1000   WVP:XTGGYIRSWNIOE, bisacodyl, chlorpheniramine-HYDROcodone,  guaiFENesin-dextromethorphan, HYDROcodone-acetaminophen, magnesium citrate, ondansetron **OR** ondansetron (ZOFRAN) IV, senna-docusate   Objective:  Vital Signs  Vitals:   11/16/18 0800 11/16/18 0900 11/16/18 1000 11/16/18 1203  BP: (!) 167/85  131/78 124/63  Pulse:      Resp: (!) 21  (!) 22 18  Temp:  98.2 F (36.8 C)  98.4 F (36.9 C)  TempSrc:  Oral  Oral  SpO2: 91%  94% 96%  Weight:      Height:        Intake/Output Summary (Last 24 hours) at 11/16/2018 1242 Last data filed at 11/16/2018 1100 Gross per 24 hour  Intake 1806.88 ml  Output -  Net 1806.88 ml   Filed Weights   11/15/18 1808  Weight: (!) 152 kg    General appearance: Awake alert.  In no distress.  Morbidly obese Resp: Mildly tachypneic at rest.  Coarse breath  sounds bilaterally.  Few crackles at the bases.  No wheezing. Cardio: S1-S2 is irregularly irregular.  No S3-S4. GI: Abdomen is soft.  Nontender nondistended.  Bowel sounds are present normal.  No masses organomegaly Extremities: No edema.  Full range of motion of lower extremities.  Both legs covered with dressing. Neurologic: No obvious focal neurological deficits.  Has general weakness in both his legs at baseline.   Lab Results:  Data Reviewed: I have personally reviewed following labs and imaging studies  CBC: Recent Labs  Lab 11/15/18 1901 11/16/18 0745  WBC 9.4 6.3  NEUTROABS 7.4 4.6  HGB 14.0 13.0  HCT 44.0 42.2  MCV 85.4 86.1  PLT 248 161    Basic Metabolic Panel: Recent Labs  Lab 11/15/18 2007 11/16/18 0745  NA 136 139  K 3.5 3.2*  CL 99 102  CO2 24 26  GLUCOSE 237* 164*  BUN 28* 30*  CREATININE 1.83* 1.73*  CALCIUM 8.1* 7.9*  MG  --  1.9  PHOS  --  2.8    GFR: Estimated Creatinine Clearance: 50.4 mL/min (A) (by C-G formula based on SCr of 1.73 mg/dL (H)).  Liver Function Tests: Recent Labs  Lab 11/15/18 2007 11/16/18 0745  AST 48* 52*  ALT 33 29  ALKPHOS 68 58  BILITOT 0.7 0.7  PROT 6.6 6.1*  ALBUMIN  3.3* 3.0*     CBG: Recent Labs  Lab 11/16/18 0454 11/16/18 0805 11/16/18 1135  GLUCAP 139* 170* 166*    Lipid Profile: Recent Labs    11/15/18 2007 11/16/18 0745  TRIG 59 109    Anemia Panel: Recent Labs    11/15/18 2008 11/16/18 0745  FERRITIN 181 241    Recent Results (from the past 240 hour(s))  Blood Culture (routine x 2)     Status: None (Preliminary result)   Collection Time: 11/15/18  6:15 PM  Result Value Ref Range Status   Specimen Description   Final    BLOOD RIGHT ARM Performed at [facility], [place] [address] [place], [place] [address]    Special Requests   Final    BOTTLES DRAWN AEROBIC AND ANAEROBIC Blood Culture adequate volume Performed at [facility] [address] [place], [place] [address]    Culture   Final    NO GROWTH < 12 HOURS Performed at [facility] [address], [place], [place] [address]    Report Status PENDING  Incomplete  Blood Culture (routine x 2)     Status: None (Preliminary result)   Collection Time: 11/15/18  6:20 PM  Result Value Ref Range Status   Specimen Description   Final    BLOOD LEFT ARM Performed at [facility] [address], [place], [place] [address]    Special Requests   Final    BOTTLES DRAWN AEROBIC AND ANAEROBIC Blood Culture adequate volume Performed at [facility] [address], [place], [place] [address]    Culture   Final    NO GROWTH < 12 HOURS Performed at [facility] [address] [place],  [address]    Report Status PENDING  Incomplete  SARS Coronavirus 2 [facility] order, Performed in [place] hospital lab)     Status: Abnormal   Collection Time: 11/15/18  7:00 PM  Result Value Ref Range Status   SARS Coronavirus 2 POSITIVE (A) NEGATIVE Final    Comment: RESULT CALLED TO, READ BACK BY AND VERIFIED WITH:  Susa Day RN 2228 11/15/18 A NAVARRO (NOTE) If result is  NEGATIVE SARS-CoV-2 target nucleic acids are NOT DETECTED. The SARS-CoV-2 RNA is generally detectable in upper and lower  respiratory specimens during the acute phase of infection. The lowest  concentration of SARS-CoV-2 viral copies this assay can detect is 250  copies / mL. A negative result does not preclude SARS-CoV-2 infection  and should not be used as the sole basis for treatment or other  patient management decisions.  A negative result may occur with  improper specimen collection / handling, submission of specimen other  than nasopharyngeal swab, presence of viral mutation(s) within the  areas targeted by this assay, and inadequate number of viral copies  (<250 copies / mL). A negative result must be combined with clinical  observations, patient history, and epidemiological information. If result is POSITIVE SARS-CoV-2 target nucleic acids are DETECTED. T he SARS-CoV-2 RNA is generally detectable in upper and lower  respiratory specimens during the acute phase of infection.  Positive  results are indicative of active infection with SARS-CoV-2.  Clinical  correlation with patient history and other diagnostic information is  necessary to determine patient infection status.  Positive results do  not rule out bacterial infection or co-infection with other viruses. If result is PRESUMPTIVE POSTIVE SARS-CoV-2 nucleic acids MAY BE PRESENT.   A presumptive positive result was obtained on the submitted specimen  and confirmed on repeat testing.  While 2019 novel coronavirus  (SARS-CoV-2) nucleic acids may be present in the submitted sample  additional confirmatory testing may be necessary for epidemiological  and / or clinical management purposes  to differentiate between  SARS-CoV-2 and other Sarbecovirus currently known to infect humans.  If clinically indicated additional testing with an alternate test  methodology [phone]) is  advised. The SARS-CoV-2 RNA is generally  detectable  in upper and lower respiratory specimens during the acute  phase of infection. The expected result is Negative. Fact Sheet for Patients:  StrictlyIdeas.no Fact Sheet for Healthcare Providers: BankingDealers.co.za This test is not yet approved or cleared by the [place] FDA and has been authorized for detection and/or diagnosis of SARS-CoV-2 by FDA under an Emergency Use Authorization (EUA).  This EUA will remain in effect (meaning this test can be used) for the duration of the COVID-19 declaration under Section 564(b)(1) of the Act, 21 U.S.C. section 360bbb-3(b)(1), unless the authorization is terminated or revoked sooner. Performed at [facility], [place] [address] [place], [place] [address]   Respiratory Panel by PCR     Status: None   Collection Time: 11/15/18  7:00 PM  Result Value Ref Range Status   Adenovirus NOT DETECTED NOT DETECTED Final   Coronavirus 229E NOT DETECTED NOT DETECTED Final    Comment: (NOTE) The Coronavirus on the Respiratory Panel, DOES NOT test for the novel  Coronavirus (2019 nCoV)    Coronavirus HKU1 NOT DETECTED NOT DETECTED Final   Coronavirus NL63 NOT DETECTED NOT DETECTED Final   Coronavirus OC43 NOT DETECTED NOT DETECTED Final   Metapneumovirus NOT DETECTED NOT DETECTED Final   Rhinovirus / Enterovirus NOT DETECTED NOT DETECTED Final   Influenza A NOT DETECTED NOT DETECTED Final   Influenza B NOT DETECTED NOT DETECTED Final   Parainfluenza Virus 1 NOT DETECTED NOT DETECTED Final   Parainfluenza Virus 2 NOT DETECTED NOT DETECTED Final   Parainfluenza Virus 3 NOT DETECTED NOT DETECTED Final   Parainfluenza Virus 4 NOT DETECTED NOT DETECTED Final   Respiratory Syncytial  Virus NOT DETECTED NOT DETECTED Final   Bordetella pertussis NOT DETECTED NOT DETECTED Final   Chlamydophila pneumoniae NOT DETECTED NOT DETECTED Final   Mycoplasma pneumoniae NOT DETECTED NOT DETECTED Final     Comment: Performed at [facility], [place] [address], [place], [place] [address]  MRSA PCR Screening     Status: Abnormal   Collection Time: 11/16/18  5:00 AM  Result Value Ref Range Status   MRSA by PCR POSITIVE (A) NEGATIVE Final    Comment:        The GeneXpert MRSA Assay (FDA approved for NASAL specimens only), is one component of a comprehensive MRSA colonization surveillance program. It is not intended to diagnose MRSA infection nor to guide or monitor treatment for MRSA infections. RESULT CALLED TO, READ BACK BY AND VERIFIED WITH: Glennon Mac RN AT 1046 ON 11/16/2018 BY S.VANHOORNE Performed at [facility], [place] [address], [place], [place] [address]       Radiology Studies: Dg Chest Port 1 View  Result Date: 11/15/2018 CLINICAL DATA:  Shortness of breath, cough. EXAM: PORTABLE CHEST 1 VIEW COMPARISON:  Radiograph of November 05, 2017. CT scan of November 06, 2017. FINDINGS: Stable cardiomegaly. No pneumothorax or pleural effusion is noted. Mildly increased bibasilar opacities are noted concerning for atelectasis or infiltrates. Bony thorax is unremarkable. IMPRESSION: Mild bibasilar opacities are noted concerning for subsegmental atelectasis or pneumonia. Electronically Signed   By: Marijo Conception M.D.   On: 11/15/2018 18:45       LOS: 1 day   [facility] Pager on [email or web address]  11/16/2018, 12:42 PM

## 2018-11-16 NOTE — Progress Notes (Signed)
Pt HR trending up to 110s this evening w/ 02 sat drifting down to 88-90% on 5lnc. Currently maintaining 02 sat 90-93% with 02 increased to 6lnc. Pt appears mildly dyspneic but states he does not feel any increase in SOB this evening. Dr [place] Pink called and made aware. Orders received. Await PCXR and then will attempt to prone pt.

## 2018-11-16 NOTE — Evaluation (Addendum)
Occupational Therapy Evaluation Patient Details Name: Reginald [name] MRN: [id number] DOB: Mar 19, 1936 Today's Date: 11/16/2018    History of Present Illness Pt adm with SOB and fever. Pt with PNA and COVID 19 positiive. PMH - morbid obesity, ckd, dm, chronic venous stasis, copd, chf, MI, afib, lumbar fusion   Clinical Impression   This 83 y/o male presents with the above. PTA pt residing in SNF, reports using w/c for mobility and RW (approx 100'), some assist for LB ADL and intermittent supervision for bathing ADL. Pt presenting with decreased activity tolerance, generalized weakness. Pt requiring modA for bed mobility, maintaining sitting balance EOB with minguard assist. Pt requiring minA for UB ADL, mod-maxA for LB and toileting ADL, and minA for sit<>stand and to take small side steps along EOB. Pt pleasant and cooperative throughout and willing to work with therapy. SpO2 decreased to 87% with activity on 5L, returning to >90% with rest. He will benefit from continued acute OT services and recommend continued OT services after return to SNF to progress pt towards PLOF. Will follow.     Follow Up Recommendations  SNF;Supervision/Assistance - 24 hour    Equipment Recommendations  Other (comment)(TBA)    Recommendations for Other Services       Precautions / Restrictions Precautions Precautions: Fall Restrictions Weight Bearing Restrictions: No      Mobility Bed Mobility Overal bed mobility: Needs Assistance Bed Mobility: Supine to Sit;Sit to Supine     Supine to sit: Mod assist Sit to supine: Mod assist   General bed mobility comments: Assist to bring rt leg off of bed, elevate trunk into sitting and bring hips to EOB.  Transfers Overall transfer level: Needs assistance Equipment used: Rolling walker (2 wheeled) Transfers: Sit to/from Stand Sit to Stand: Min assist         General transfer comment: Assist to bring hips up and for balance. Verbal cues for hand  placement    Balance Overall balance assessment: Needs assistance Sitting-balance support: No upper extremity supported;Feet supported Sitting balance-Leahy Scale: Fair     Standing balance support: Bilateral upper extremity supported Standing balance-Leahy Scale: Poor Standing balance comment: walker and supervision for static standing                           ADL either performed or assessed with clinical judgement   ADL Overall ADL's : Needs assistance/impaired Eating/Feeding: Set up;Sitting   Grooming: Wash/dry face;Set up;Min guard;Sitting   Upper Body Bathing: Minimal assistance;Sitting   Lower Body Bathing: Moderate assistance;Sit to/from stand   Upper Body Dressing : Minimal assistance;Sitting   Lower Body Dressing: Maximal assistance;Sit to/from stand Lower Body Dressing Details (indicate cue type and reason): pt unable to reach towards his feet at this time     Toileting- Clothing Manipulation and Hygiene: Maximal assistance;Sit to/from stand Toileting - Clothing Manipulation Details (indicate cue type and reason): pt with some residual BM upon standing from EOB; requires assist for peri-care from RN while therapist provides steadying assist     Functional mobility during ADLs: Minimal assistance;Rolling walker       Vision         Perception     Praxis      Pertinent Vitals/Pain Pain Assessment: No/denies pain     Hand Dominance Right   Extremity/Trunk Assessment Upper Extremity Assessment Upper Extremity Assessment: LUE deficits/detail;Generalized weakness LUE Deficits / Details: decrease shoulder AROM, pt reports baseline shoulder issues from fall a  couple of years ago LUE Coordination: decreased gross motor   Lower Extremity Assessment Lower Extremity Assessment: Defer to PT evaluation       Communication Communication Communication: HOH   Cognition Arousal/Alertness: Awake/alert Behavior During Therapy: WFL for tasks  assessed/performed Overall Cognitive Status: Within Functional Limits for tasks assessed                                 General Comments: pt HOH requiring increased time and often repetition of instruction   General Comments      Exercises     Shoulder Instructions      Home Living Family/patient expects to be discharged to:: Skilled nursing facility                                        Prior Functioning/Environment Level of Independence: Needs assistance  Gait / Transfers Assistance Needed: Pt modified independent with transfer to w/c and using w/c. Amb up to 100' with rolling walker and assist ADL's / Homemaking Assistance Needed: Pt reports setup assist/intermittent supervision for bathing; some assist for LB dressing            OT Problem List: Decreased strength;Decreased range of motion;Decreased activity tolerance;Impaired balance (sitting and/or standing);Decreased knowledge of use of DME or AE;Obesity;Impaired UE functional use;Decreased knowledge of precautions      OT Treatment/Interventions: Self-care/ADL training;Therapeutic exercise;Energy conservation;DME and/or AE instruction;Therapeutic activities;Patient/family education;Balance training    OT Goals(Current goals can be found in the care plan section) Acute Rehab OT Goals Patient Stated Goal: Get up out of bed OT Goal Formulation: With patient Time For Goal Achievement: 11/30/18 Potential to Achieve Goals: Good  OT Frequency: Min 2X/week   Barriers to D/C:            Co-evaluation              AM-PAC OT "6 Clicks" Daily Activity     Outcome Measure Help from another person eating meals?: None Help from another person taking care of personal grooming?: A Little Help from another person toileting, which includes using toliet, bedpan, or urinal?: A Lot Help from another person bathing (including washing, rinsing, drying)?: A Lot Help from another person to put  on and taking off regular upper body clothing?: A Little Help from another person to put on and taking off regular lower body clothing?: A Lot 6 Click Score: 16   End of Session Equipment Utilized During Treatment: Rolling walker;Oxygen Nurse Communication: Mobility status  Activity Tolerance: Patient tolerated treatment well Patient left: in bed;with call bell/phone within reach;with bed alarm set  OT Visit Diagnosis: Muscle weakness (generalized) (M62.81);Unsteadiness on feet (R26.81)                Time: 4081-4481 OT Time Calculation (min): 35 min Charges:  OT General Charges $OT Visit: 1 Visit OT Evaluation $OT Eval Moderate Complexity: 1 Mod OT Treatments $Self Care/Home Management : 8-22 mins  Lou Cal, OT Supplemental Rehabilitation Services Pager [phone] Office [place] 11/16/2018, 4:31 PM

## 2018-11-16 NOTE — TOC Initial Note (Signed)
Transition of Care [facility]) - Initial/Assessment Note    Patient Details  Name: Reginald [name] MRN: [id number] Date of Birth: 07/25/1935  [facility] CM/SW Contact:    Alberteen Sam, [place] Phone Number: [phone] 11/16/2018, 2:12 PM  Clinical Narrative:                  CSW attempted to call into patient's room, no answer. CSW reached out to patient's son Reginald [name] who confirms patient is from [place] and states he has been living there for a year now as he has transitioned to long term care. Reginald [name] reports being pleased with [facility]'s ability to care for patient so far, and is agreeable to patient returning to [place] at discharge. He asks to be updated on patient's status, CSW will continue to follow up for discharge planning needs.   Expected Discharge Plan: Skilled Nursing Facility Barriers to Discharge: Continued Medical Work up   Patient Goals and CMS Choice   CMS Medicare.gov Compare Post Acute Care list provided to:: Patient Represenative (must comment)(patient's son Reginald [name]) Choice offered to / list presented to : Adult Children  Expected Discharge Plan and Services Expected Discharge Plan: [place]   Discharge Planning Services: NA Post Acute Care Choice: [place] Living arrangements for the past 2 months: [place]                 DME Arranged: N/A DME Agency: NA       HH Arranged: NA [place] Agency: NA        Prior Living Arrangements/Services Living arrangements for the past 2 months: [place] Lives with:: Self Patient language and need for interpreter reviewed:: Yes Do you feel safe going back to the place where you live?: Yes      Need for Family Participation in Patient Care: Yes (Comment) Care giver support system in place?: Yes (comment)   Criminal Activity/Legal Involvement Pertinent to Current Situation/Hospitalization: No - Comment as needed  Activities of Daily Living Home  Assistive Devices/Equipment: Wheelchair ADL Screening (condition at time of admission) Patient's cognitive ability adequate to safely complete daily activities?: Yes Is the patient deaf or have difficulty hearing?: No Does the patient have difficulty seeing, even when wearing glasses/contacts?: No Does the patient have difficulty concentrating, remembering, or making decisions?: No Patient able to express need for assistance with ADLs?: Yes Does the patient have difficulty dressing or bathing?: Yes Independently performs ADLs?: No Communication: Independent Dressing (OT): Needs assistance Is this a change from baseline?: Pre-admission baseline Grooming: Needs assistance Is this a change from baseline?: Pre-admission baseline Feeding: Needs assistance Is this a change from baseline?: Pre-admission baseline Bathing: Needs assistance Is this a change from baseline?: Pre-admission baseline Toileting: Needs assistance Is this a change from baseline?: Pre-admission baseline In/Out Bed: Needs assistance Is this a change from baseline?: Pre-admission baseline Walks in Home: Needs assistance Is this a change from baseline?: Pre-admission baseline Does the patient have difficulty walking or climbing stairs?: Yes Weakness of Legs: Both Weakness of Arms/Hands: Both  Permission Sought/Granted Permission sought to share information with : Case Manager, Customer service manager, Guardian, Family Supports Permission granted to share information with : Yes, Verbal Permission Granted  Share Information with NAME: Reginald [name]  Permission granted to share info w AGENCY: SNFs  Permission granted to share info w Relationship: son  Permission granted to share info w Contact Information: [phone]  Emotional Assessment Appearance:: Appears stated age Attitude/Demeanor/Rapport: Unable to Assess Affect (  typically observed): Unable to Assess Orientation: : Oriented to Self, Oriented to Place,  Oriented to  Time, Oriented to Situation Alcohol / Substance Use: Not Applicable Psych Involvement: No (comment)  Admission diagnosis:  Community acquired pneumonia, unspecified laterality [J18.9] COVID-19 [U07.1, J98.8] Patient Active Problem List   Diagnosis Date Noted  . HCAP (healthcare-associated pneumonia) 11/15/2018  . Acute respiratory disease due to COVID-19 virus 11/15/2018  . Chronic venous insufficiency 10/04/2017  . Venous stasis ulcers of both lower extremities ([place]) 10/04/2017  . Nonspecific chest pain 10/27/2016  . Morbid obesity ([place]) 05/02/2016  . Lymphedema 05/02/2016  . Abnormal laboratory test result 10/15/2015  . Cough 10/08/2015  . Hematuria 09/21/2015  . Hypokalemia 08/13/2015  . HTN (hypertension) 08/13/2015  . Multiple open wounds of lower leg 07/22/2015  . CKD (chronic kidney disease) stage 4, GFR 15-29 ml/min (HCC) 07/22/2015  . Cellulitis of leg, right 06/28/2015  . ARF (acute renal failure) ([place]) 06/28/2015  . Hypertensive heart disease with CHF (congestive heart failure) ([place]) 05/19/2015  . CAD (coronary artery disease), native coronary artery 05/19/2015  . Hyperlipidemia 05/19/2015  . Vitamin D deficiency 05/19/2015  . Pressure ulcer 05/16/2015  . Diastolic dysfunction with chronic heart failure ([place]) 05/08/2015  . Blisters of multiple sites 05/08/2015  . Type 2 diabetes mellitus ([place])   . Obesity, unspecified   . OSA (obstructive sleep apnea)   . Atrial fibrillation ([place])   . Secondary DM with CKD stage 4 and hypertension ([place])   . Glaucoma   . Cellulitis 05/07/2015   PCP:  Guadlupe Spanish, MD Pharmacy:   [facility], [place] [address] Phone: [phone] Fax: [phone]     Social Determinants of Health (SDOH) Interventions    Readmission Risk Interventions No flowsheet data found.

## 2018-11-16 NOTE — Progress Notes (Signed)
Pt educated on need for flutter valve and he demonstrates appropriate use. WOC consult done via video at this time. Will reapply dressings as ordered when necessary supplies (xeroform). Pt aware of need to be up in chair today and is agreeable. PT on unit and preparing to assist pt with this.

## 2018-11-16 NOTE — ED Notes (Signed)
[facility] notified of transport need

## 2018-11-16 NOTE — Consult Note (Signed)
[facility] Nurse wound consult note Patient receiving care in [facility] hospital and is COVID19 positive.  The consult was completed remotely using the bedside camera and information from the primary RN, Elzie Rings, at the bedside. Reason for Consult: chronic venous stasis to BLE. Wound type: Small open wound to anterior right pre-tibial wound. No other wounds noted by Elzie Rings. Drainage (amount, consistency, odor) serous Periwound: hardened, thickened, brown discolored consistent with chronic venous stasis Dressing procedure/placement/frequency: Every Friday and Monday, perform the following wound care to the BLE.  Cleanse with soap and water. Pat dry. Apply moisturizing lotion. Place Xeroform gauze Kellie Simmering [id number]) over any open areas.  Spiral wrap kerlex from just behind the toes, to just below the knees. Secure with spiral wrapped Ace Wraps (4 inch width) in the same manner.  Change every Friday and Monday.  Monitor the wound area(s) for worsening of condition such as: Signs/symptoms of infection,  Increase in size,  Development of or worsening of odor, Development of pain, or increased pain at the affected locations.  Notify the medical team if any of these develop.  Thank you for the consult.  Discussed plan of care with the patient and bedside nurse.  [place] nurse will not follow at this time.  Please re-consult the [facility] team if needed.  Val Riles, RN, MSN, CWOCN, CNS-BC, pager [phone]

## 2018-11-17 LAB — CBC WITH DIFFERENTIAL/PLATELET
Abs Immature Granulocytes: 0.01 10*3/uL (ref 0.00–0.07)
Basophils Absolute: 0 10*3/uL (ref 0.0–0.1)
Basophils Relative: 0 %
Eosinophils Absolute: 0 10*3/uL (ref 0.0–0.5)
Eosinophils Relative: 1 %
HCT: 40.8 % (ref 39.0–52.0)
Hemoglobin: 12.3 g/dL — ABNORMAL LOW (ref 13.0–17.0)
Immature Granulocytes: 0 %
Lymphocytes Relative: 18 %
Lymphs Abs: 0.7 10*3/uL (ref 0.7–4.0)
MCH: 26.1 pg (ref 26.0–34.0)
MCHC: 30.1 g/dL (ref 30.0–36.0)
MCV: 86.6 fL (ref 80.0–100.0)
Monocytes Absolute: 0.5 10*3/uL (ref 0.1–1.0)
Monocytes Relative: 12 %
Neutro Abs: 2.7 10*3/uL (ref 1.7–7.7)
Neutrophils Relative %: 69 %
Platelets: 201 10*3/uL (ref 150–400)
RBC: 4.71 MIL/uL (ref 4.22–5.81)
RDW: 16.7 % — ABNORMAL HIGH (ref 11.5–15.5)
WBC: 3.8 10*3/uL — ABNORMAL LOW (ref 4.0–10.5)
nRBC: 0 % (ref 0.0–0.2)

## 2018-11-17 LAB — COMPREHENSIVE METABOLIC PANEL
ALT: 31 U/L (ref 0–44)
AST: 54 U/L — ABNORMAL HIGH (ref 15–41)
Albumin: 2.9 g/dL — ABNORMAL LOW (ref 3.5–5.0)
Alkaline Phosphatase: 55 U/L (ref 38–126)
Anion gap: 12 (ref 5–15)
BUN: 31 mg/dL — ABNORMAL HIGH (ref 8–23)
CO2: 29 mmol/L (ref 22–32)
Calcium: 8 mg/dL — ABNORMAL LOW (ref 8.9–10.3)
Chloride: 100 mmol/L (ref 98–111)
Creatinine, Ser: 1.82 mg/dL — ABNORMAL HIGH (ref 0.61–1.24)
GFR calc Af Amer: 40 mL/min — ABNORMAL LOW (ref >60)
GFR calc non Af Amer: 34 mL/min — ABNORMAL LOW (ref >60)
Glucose, Bld: 114 mg/dL — ABNORMAL HIGH (ref 70–99)
Potassium: 3 mmol/L — ABNORMAL LOW (ref 3.5–5.1)
Sodium: 141 mmol/L (ref 135–145)
Total Bilirubin: 0.8 mg/dL (ref 0.3–1.2)
Total Protein: 6 g/dL — ABNORMAL LOW (ref 6.5–8.1)

## 2018-11-17 LAB — TRIGLYCERIDES: Triglycerides: 101 mg/dL (ref <150)

## 2018-11-17 LAB — GLUCOSE, CAPILLARY
Glucose-Capillary: 104 mg/dL — ABNORMAL HIGH (ref 70–99)
Glucose-Capillary: 129 mg/dL — ABNORMAL HIGH (ref 70–99)
Glucose-Capillary: 250 mg/dL — ABNORMAL HIGH (ref 70–99)
Glucose-Capillary: 339 mg/dL — ABNORMAL HIGH (ref 70–99)

## 2018-11-17 LAB — C-REACTIVE PROTEIN: CRP: 8.9 mg/dL — ABNORMAL HIGH (ref <1.0)

## 2018-11-17 LAB — MAGNESIUM: Magnesium: 2 mg/dL (ref 1.7–2.4)

## 2018-11-17 LAB — PHOSPHORUS: Phosphorus: 3.3 mg/dL (ref 2.5–4.6)

## 2018-11-17 LAB — D-DIMER, QUANTITATIVE: D-Dimer, Quant: 1.52 ug/mL-FEU — ABNORMAL HIGH (ref 0.00–0.50)

## 2018-11-17 LAB — FERRITIN: Ferritin: 289 ng/mL (ref 24–336)

## 2018-11-17 MED ORDER — SODIUM CHLORIDE 0.9 % IV SOLN
2.0000 g | Freq: Two times a day (BID) | INTRAVENOUS | Status: DC
  Administered 2018-11-17 – 2018-11-21 (×9): 2 g via INTRAVENOUS
  Filled 2018-11-17 (×10): qty 2

## 2018-11-17 MED ORDER — TOCILIZUMAB 400 MG/20ML IV SOLN
800.0000 mg | Freq: Once | INTRAVENOUS | Status: AC
  Administered 2018-11-17: 800 mg via INTRAVENOUS
  Filled 2018-11-17: qty 40

## 2018-11-17 MED ORDER — VANCOMYCIN HCL 10 G IV SOLR
1250.0000 mg | INTRAVENOUS | Status: DC
  Administered 2018-11-17: 1250 mg via INTRAVENOUS
  Filled 2018-11-17 (×2): qty 1250

## 2018-11-17 MED ORDER — POTASSIUM CHLORIDE CRYS ER 20 MEQ PO TBCR
40.0000 meq | EXTENDED_RELEASE_TABLET | Freq: Once | ORAL | Status: AC
  Administered 2018-11-17: 18:00:00 40 meq via ORAL
  Filled 2018-11-17: qty 2

## 2018-11-17 MED ORDER — METHYLPREDNISOLONE SODIUM SUCC 125 MG IJ SOLR
60.0000 mg | Freq: Two times a day (BID) | INTRAMUSCULAR | Status: DC
  Administered 2018-11-17 – 2018-11-18 (×4): 60 mg via INTRAVENOUS
  Filled 2018-11-17 (×4): qty 2

## 2018-11-17 NOTE — Progress Notes (Addendum)
PROGRESS NOTE  Reginald [name] UUV:[id number] DOB: 1935-08-15 DOA: 11/15/2018  PCP: Guadlupe Spanish, MD  Brief History/Interval Summary: 83 y.o. male with medical history significant for atrial fibrillation, type 2 diabetes mellitus, essential hypertension, HLD, morbid obesity, chronic venous stasis with chronic ulcers who presented to [facility] long ED from SNF with reported shortness of breath and fever.  Per EMS report, noted to be saturating in the mid 80s, initially requiring nonrebreather.  Patient was noted to be febrile.  Chest x-ray showed bibasilar opacities.  Patient was hospitalized for further management.    Reason for Visit: Acute respiratory failure with hypoxia secondary to COVID-19  Consultants: None  Procedures: None  Antibiotics: Anti-infectives (From admission, onward)   Start     Dose/Rate Route Frequency Ordered Stop   11/17/18 1000  hydroxychloroquine (PLAQUENIL) tablet 200 mg  Status:  Discontinued     200 mg Oral 2 times daily 11/16/18 0420 11/16/18 1307   11/16/18 1000  hydroxychloroquine (PLAQUENIL) tablet 400 mg  Status:  Discontinued     400 mg Oral 2 times daily 11/16/18 0420 11/16/18 1307   11/15/18 2345  vancomycin (VANCOCIN) 1,500 mg in sodium chloride 0.9 % 500 mL IVPB     1,500 mg 250 mL/hr over 120 Minutes Intravenous  Once 11/15/18 2335 11/16/18 0205   11/15/18 1915  ceFEPIme (MAXIPIME) 2 g in sodium chloride 0.9 % 100 mL IVPB     2 g 200 mL/hr over 30 Minutes Intravenous  Once 11/15/18 1901 11/15/18 2035   11/15/18 1900  vancomycin (VANCOCIN) IVPB 1000 mg/200 mL premix     1,000 mg 200 mL/hr over 60 Minutes Intravenous  Once 11/15/18 1855 11/15/18 2240       Subjective/Interval History: Overnight patient's oxygen requirement climbed.  He was noted to be more dyspneic.  Patient states that he finds it very hard to be in the prone position.  Patient was given Actemra.  States that he is feeling better this morning.  Continues to have a cough.   Shortness of breath persists.      Assessment/Plan:  Acute Hypoxic Resp. Failure due to Acute Covid 19 Viral Illness during the ongoing 2020 Covid 19 Pandemic  COVID-19 Labs  Recent Labs    11/15/18 2007 11/15/18 2008 11/16/18 0745 11/17/18 0405  DDIMER 1.85*  --   --  1.52*  FERRITIN  --  181 241 289  LDH 233*  --   --   --   CRP  --  7.0* 8.5* 8.9*    Lab Results  Component Value Date   SARSCOV2NAA POSITIVE (A) 11/15/2018     Last episode of fever: Febrile again last night.  Temperature 102.8 on 5/8. Oxygen requirements: He is now on 6 L of oxygen by nasal cannula.  Saturating in the mid 90s.   Antibiotics: Patient's hydroxychloroquine was discontinued on 5/8.  Patient remains on vancomycin and cefepime.  MRSA PCR positive.  If blood cultures remain negative vancomycin can be discontinued.  Steroids: Will initiate Solu-Medrol for wheezing. Diuretics: Torsemide continued at home dose Actemra: 800 mg dose given on 5/8 at 9:45 PM.  Additional dose to be given this morning. QuantiFERON test: Ordered for tomorrow morning  Convalescent Plasma: No clear indication to be considered for use of convalescent plasma. Vitamin C and zinc: Initiated   Prone positioning: Patient encouraged to stay in prone position as much as possible.  However he has limited mobility at baseline and he is obese and points it very  hard to be in the prone position.  Still this was encouraged.  Mobilize as much as possible.  PT and OT evaluation.  However patient does not appear to have a lot of mobility at baseline.  He usually gets around in a wheelchair.  Inflammatory markers and d-dimer as above.  Continue to trend.  Procalcitonin 0.13.  Lactic acid level was 2.0.  Patient was placed on vancomycin and cefepime.  Follow-up on cultures.  Respiratory viral panel was negative.  Strep pneumonia urinary antigen negative.  Influenza PCR negative.  MRSA PCR was noted to be positive.  Low threshold to  discontinue IV antibiotics depending on culture data.  The treatment plan and use of medications and known side effects were discussed with patient/family. It was clearly explained that there is no proven definitive treatment for COVID-19 infection yet. Any medications used here are based on case reports/anecdotal data which are not peer-reviewed and has not been studied using randomized control trials.  Complete risks and long-term side effects are unknown, however in the best clinical judgment they seem to be of some clinical benefit rather than medical risks.  Patient/family agree with the treatment plan and want to receive these treatments. Actemra was given.   Addendum: On review of orders it appears that patient did not get any vancomycin and cefepime yesterday.  The order for these antibiotics was inadvertently discontinued on the night of 5/7.  Due to persistent fever we will reinitiate these for now until we have culture data.   History of persistent atrial fibrillation Continue metoprolol for rate control.  Patient is chronically anticoagulated with Xarelto.  Acute on chronic kidney disease stage IV Creatinine was 1.83 on admission.  Last creatinine in our records is 1.27 in 2018.  We will for the most part.  Noted to be 1.82 today.  Continue to monitor urine output.  Avoid nephrotoxic agents. Good Urine output yesterday.  Replace potassium.  Essential hypertension Continue home medications. blood pressure is reasonably well controlled.  Continue to monitor.  Chronic diastolic CHF Followed by cardiology, Dr. Zella Richer, as an outpatient.  Last echocardiogram from 2018 showed normal systolic function at 55 to 60%.  Continue beta-blocker.  Patient is on torsemide which is being continued.  Diabetes mellitus type 2 HbA1c 6.9 in 2018.  Home medications held.  Lantus initiated.  Monitor CBGs.  SSI.  Anticipate some worsening in his glycemic control due to steroids.  History of peripheral  neuropathy Continue gabapentin  History of glaucoma Continue eyedrops  History of hyperlipidemia Continue home medications.  History of depression Continue Lexapro  Morbid obesity BMI 48.08.   DVT Prophylaxis: On Xarelto  Lab Results  Component Value Date   PLT 201 11/17/2018     PUD Prophylaxis: Pepcid Code Status: Full code Family Communication: Discussed with the patient.  Discussed with his son yesterday. Disposition Plan: Management as outlined above.  Patient is here from a skilled nursing facility.   Medications:  Scheduled: . amLODipine  10 mg Oral Daily  . atorvastatin  20 mg Oral QHS  . Chlorhexidine Gluconate Cloth  6 each Topical Q0600  . escitalopram  5 mg Oral Daily  . famotidine  20 mg Oral Daily  . gabapentin  100 mg Oral TID  . insulin aspart  0-15 Units Subcutaneous TID WC  . insulin aspart  0-5 Units Subcutaneous QHS  . insulin glargine  10 Units Subcutaneous QHS  . latanoprost  1 drop Both Eyes QHS  . loratadine  10  mg Oral Daily  . magnesium oxide  400 mg Oral Daily  . mouth rinse  15 mL Mouth Rinse BID  . Melatonin  5 mg Oral QHS  . metoprolol tartrate  25 mg Oral Daily  . mupirocin ointment  1 application Nasal BID  . potassium chloride SA  20 mEq Oral Daily  . Rivaroxaban  15 mg Oral Q breakfast  . torsemide  40 mg Oral Daily  . vitamin C  500 mg Oral Daily  . zinc sulfate  220 mg Oral Daily   Continuous:  ZJQ:BHALPFXTKWIOX, bisacodyl, chlorpheniramine-HYDROcodone, guaiFENesin-dextromethorphan, HYDROcodone-acetaminophen, ipratropium, magnesium citrate, ondansetron **OR** ondansetron (ZOFRAN) IV, senna-docusate   Objective:  Vital Signs  Vitals:   11/16/18 2146 11/17/18 0038 11/17/18 0409 11/17/18 0757  BP:  (!) 106/53 113/67 (!) 126/54  Pulse:  84 80   Resp:    19  Temp: (!) 100.8 F (38.2 C) 99.1 F (37.3 C) 98.2 F (36.8 C) 98.3 F (36.8 C)  TempSrc:  Oral Oral Oral  SpO2:  94% 95% 96%  Weight:      Height:         Intake/Output Summary (Last 24 hours) at 11/17/2018 1053 Last data filed at 11/17/2018 0800 Gross per 24 hour  Intake 857.55 ml  Output 3500 ml  Net -2642.45 ml   Filed Weights   11/15/18 1808  Weight: (!) 152 kg   General appearance: Awake alert.  In no distress.  Morbidly obese Resp: Noted to be mildly tachypneic.  Coarse breath sounds bilaterally with scattered wheezes.  No rhonchi.  Few crackles at the bases.   Cardio: S1-S2 is normal regular.  No S3-S4.  No rubs murmurs or bruit GI: Abdomen is soft.  Nontender nondistended.  Bowel sounds are present normal.  No masses organomegaly Extremities: No edema.  Full range of motion of lower extremities.  Both legs covered in dressing. Neurologic: Alert and oriented x3.  No focal neurological deficits.  Generalized weakness of lower extremities noted.    Lab Results:  Data Reviewed: I have personally reviewed following labs and imaging studies  CBC: Recent Labs  Lab 11/15/18 1901 11/16/18 0745 11/17/18 0405  WBC 9.4 6.3 3.8*  NEUTROABS 7.4 4.6 2.7  HGB 14.0 13.0 12.3*  HCT 44.0 42.2 40.8  MCV 85.4 86.1 86.6  PLT 248 179 735    Basic Metabolic Panel: Recent Labs  Lab 11/15/18 2007 11/16/18 0745 11/17/18 0405  NA 136 139 141  K 3.5 3.2* 3.0*  CL 99 102 100  CO2 24 26 29   GLUCOSE 237* 164* 114*  BUN 28* 30* 31*  CREATININE 1.83* 1.73* 1.82*  CALCIUM 8.1* 7.9* 8.0*  MG  --  1.9 2.0  PHOS  --  2.8 3.3    GFR: Estimated Creatinine Clearance: 47.9 mL/min (A) (by C-G formula based on SCr of 1.82 mg/dL (H)).  Liver Function Tests: Recent Labs  Lab 11/15/18 2007 11/16/18 0745 11/17/18 0405  AST 48* 52* 54*  ALT 33 29 31  ALKPHOS 68 58 55  BILITOT 0.7 0.7 0.8  PROT 6.6 6.1* 6.0*  ALBUMIN 3.3* 3.0* 2.9*     CBG: Recent Labs  Lab 11/16/18 0805 11/16/18 1135 11/16/18 1650 11/16/18 2103 11/17/18 0755  GLUCAP 170* 166* 158* 136* 104*    Lipid Profile: Recent Labs    11/16/18 0745 11/17/18 0405   TRIG 109 101    Anemia Panel: Recent Labs    11/16/18 0745 11/17/18 0405  FERRITIN 241 289  Recent Results (from the past 240 hour(s))  Blood Culture (routine x 2)     Status: None (Preliminary result)   Collection Time: 11/15/18  6:15 PM  Result Value Ref Range Status   Specimen Description   Final    BLOOD RIGHT ARM Performed at [facility] [address], [place], [place] [address]    Special Requests   Final    BOTTLES DRAWN AEROBIC AND ANAEROBIC Blood Culture adequate volume Performed at [facility] [address], [place], [place] [address]    Culture   Final    NO GROWTH < 24 HOURS Performed at [facility] [address], [place], [place] [address]    Report Status PENDING  Incomplete  Blood Culture (routine x 2)     Status: None (Preliminary result)   Collection Time: 11/15/18  6:20 PM  Result Value Ref Range Status   Specimen Description   Final    BLOOD LEFT ARM Performed at [facility] [address] [place], [place] [address]    Special Requests   Final    BOTTLES DRAWN AEROBIC AND ANAEROBIC Blood Culture adequate volume Performed at [facility] [address] [place], [place] [address]    Culture   Final    NO GROWTH < 24 HOURS Performed at [facility] [address] [place],  [address]    Report Status PENDING  Incomplete  SARS Coronavirus 2 [facility] order, Performed in [place] hospital lab)     Status: Abnormal   Collection Time: 11/15/18  7:00 PM  Result Value Ref Range Status   SARS Coronavirus 2 POSITIVE (A) NEGATIVE Final    Comment: RESULT CALLED TO, READ BACK BY AND VERIFIED WITH: Susa Day RN 2228 11/15/18 A NAVARRO (NOTE) If result is NEGATIVE SARS-CoV-2 target nucleic acids are NOT DETECTED. The SARS-CoV-2 RNA is generally detectable in upper and lower  respiratory specimens during the acute phase of infection. The lowest   concentration of SARS-CoV-2 viral copies this assay can detect is 250  copies / mL. A negative result does not preclude SARS-CoV-2 infection  and should not be used as the sole basis for treatment or other  patient management decisions.  A negative result may occur with  improper specimen collection / handling, submission of specimen other  than nasopharyngeal swab, presence of viral mutation(s) within the  areas targeted by this assay, and inadequate number of viral copies  (<250 copies / mL). A negative result must be combined with clinical  observations, patient history, and epidemiological information. If result is POSITIVE SARS-CoV-2 target nucleic acids are DETECTED. T he SARS-CoV-2 RNA is generally detectable in upper and lower  respiratory specimens during the acute phase of infection.  Positive  results are indicative of active infection with SARS-CoV-2.  Clinical  correlation with patient history and other diagnostic information is  necessary to determine patient infection status.  Positive results do  not rule out bacterial infection or co-infection with other viruses. If result is PRESUMPTIVE POSTIVE SARS-CoV-2 nucleic acids MAY BE PRESENT.   A presumptive positive result was obtained on the submitted specimen  and confirmed on repeat testing.  While 2019 novel coronavirus  (SARS-CoV-2) nucleic acids may be present in the submitted sample  additional confirmatory testing may be necessary for epidemiological  and / or clinical management purposes  to differentiate between  SARS-CoV-2 and other Sarbecovirus currently known to infect humans.  If clinically indicated additional testing with an alternate test  methodology [phone]) is  advised. The SARS-CoV-2 RNA is generally  detectable in upper and lower respiratory specimens during the acute  phase of infection. The expected result is Negative. Fact Sheet for Patients:  StrictlyIdeas.no Fact Sheet  for Healthcare Providers: BankingDealers.co.za This test is not yet approved or cleared by the [place] FDA and has been authorized for detection and/or diagnosis of SARS-CoV-2 by FDA under an Emergency Use Authorization (EUA).  This EUA will remain in effect (meaning this test can be used) for the duration of the COVID-19 declaration under Section 564(b)(1) of the Act, 21 U.S.C. section 360bbb-3(b)(1), unless the authorization is terminated or revoked sooner. Performed at [facility], [place] [address] [place], [place] [address]   Respiratory Panel by PCR     Status: None   Collection Time: 11/15/18  7:00 PM  Result Value Ref Range Status   Adenovirus NOT DETECTED NOT DETECTED Final   Coronavirus 229E NOT DETECTED NOT DETECTED Final    Comment: (NOTE) The Coronavirus on the Respiratory Panel, DOES NOT test for the novel  Coronavirus (2019 nCoV)    Coronavirus HKU1 NOT DETECTED NOT DETECTED Final   Coronavirus NL63 NOT DETECTED NOT DETECTED Final   Coronavirus OC43 NOT DETECTED NOT DETECTED Final   Metapneumovirus NOT DETECTED NOT DETECTED Final   Rhinovirus / Enterovirus NOT DETECTED NOT DETECTED Final   Influenza A NOT DETECTED NOT DETECTED Final   Influenza B NOT DETECTED NOT DETECTED Final   Parainfluenza Virus 1 NOT DETECTED NOT DETECTED Final   Parainfluenza Virus 2 NOT DETECTED NOT DETECTED Final   Parainfluenza Virus 3 NOT DETECTED NOT DETECTED Final   Parainfluenza Virus 4 NOT DETECTED NOT DETECTED Final   Respiratory Syncytial Virus NOT DETECTED NOT DETECTED Final   Bordetella pertussis NOT DETECTED NOT DETECTED Final   Chlamydophila pneumoniae NOT DETECTED NOT DETECTED Final   Mycoplasma pneumoniae NOT DETECTED NOT DETECTED Final    Comment: Performed at [facility], [address] [place], [place] [address]  MRSA PCR Screening     Status: Abnormal   Collection Time: 11/16/18  5:00 AM  Result Value Ref Range Status    MRSA by PCR POSITIVE (A) NEGATIVE Final    Comment:        The GeneXpert MRSA Assay (FDA approved for NASAL specimens only), is one component of a comprehensive MRSA colonization surveillance program. It is not intended to diagnose MRSA infection nor to guide or monitor treatment for MRSA infections. RESULT CALLED TO, READ BACK BY AND VERIFIED WITH: Glennon Mac RN AT 1046 ON 11/16/2018 BY S.VANHOORNE Performed at [facility], [place] [address], [place], [place] [address]       Radiology Studies: Dg Chest Port 1 View  Result Date: 11/16/2018 CLINICAL DATA:  Hypoxia EXAM: PORTABLE CHEST 1 VIEW COMPARISON:  11/15/2018 FINDINGS: Cardiac shadow is stable. Increasing bilateral patchy opacities are noted when compare with the prior exam. No sizable effusion is seen. No bony abnormality is noted. IMPRESSION: Patchy infiltrates slightly increased when compare with the prior exam. Electronically Signed   By: Inez Catalina M.D.   On: 11/16/2018 20:03   Dg Chest Port 1 View  Result Date: 11/15/2018 CLINICAL DATA:  Shortness of breath, cough. EXAM: PORTABLE CHEST 1 VIEW COMPARISON:  Radiograph of November 05, 2017. CT scan of November 06, 2017. FINDINGS: Stable cardiomegaly. No pneumothorax or pleural effusion is noted. Mildly increased bibasilar opacities  are noted concerning for atelectasis or infiltrates. Bony thorax is unremarkable. IMPRESSION: Mild bibasilar opacities are noted concerning for subsegmental atelectasis or pneumonia. Electronically Signed   By: Marijo Conception M.D.   On: 11/15/2018 18:45       LOS: 2 days   [facility] Pager on [email or web address]  11/17/2018, 10:53 AM

## 2018-11-17 NOTE — Progress Notes (Signed)
Physical Therapy Treatment Patient Details Name: Reginald [name] MRN: [id number] DOB: Mar 25, 1936 Today's Date: 11/17/2018    History of Present Illness Pt adm with SOB and fever. Pt with PNA and COVID 19 positiive. PMH - morbid obesity, ckd, dm, chronic venous stasis, copd, chf, MI, afib, lumbar fusion    PT Comments    The patient   Was sleeping when found. Patient had not  Eaten breakfast at 1100. Assisted patient to ambu;ate a short distance. Patient on 3 L [place] with sats 94%. Continue  Mobility/ambulation.    Follow Up Recommendations  SNF     Equipment Recommendations  None recommended by PT    Recommendations for Other Services       Precautions / Restrictions Precautions Precautions: Fall Precaution Comments: monitor O2 sats    Mobility  Bed Mobility Overal bed mobility: Needs Assistance Bed Mobility: Supine to Sit     Supine to sit: Mod assist     General bed mobility comments: extra time, delayed. assist  to initiate legs, Mod assist for trunk  Transfers   Equipment used: Rolling walker (2 wheeled) Transfers: Sit to/from Stand Sit to Stand: Min assist         General transfer comment: Assist to bring hips up and for balance. Verbal cues for hand placement  Ambulation/Gait Ambulation/Gait assistance: Min assist Gait Distance (Feet): 15 Feet Assistive device: Rolling walker (2 wheeled) Gait Pattern/deviations: Step-through pattern;Wide base of support Gait velocity: decr   General Gait Details: tends to have RW ahead. Able to turn around using RW.gait is slow. No balance losses.sign    Stairs             Engineer, building services [place] (Stroke Patients Only)       Balance                                            Cognition Arousal/Alertness: Awake/alert Behavior During Therapy: Flat affect;WFL for tasks assessed/performed Overall Cognitive Status: No family/caregiver present to determine baseline  cognitive functioning Area of Impairment: Orientation                 Orientation Level: Time             General Comments: pt HOH requiring increased time and often repetition of instruction      Exercises General Exercises - Lower Extremity Long Arc Quad: AROM;Seated;10 reps Hip Flexion/Marching: AROM;Seated;10 reps    General Comments        Pertinent Vitals/Pain      Home Living                      Prior Function            PT Goals (current goals can now be found in the care plan section) Progress towards PT goals: Progressing toward goals    Frequency    Min 3X/week      PT Plan Current plan remains appropriate    Co-evaluation              AM-PAC PT "6 Clicks" Mobility   Outcome Measure  Help needed turning from your back to your side while in a flat bed without using bedrails?: A Lot Help needed moving from lying on your back to sitting on the side of a flat bed without using bedrails?:  A Lot Help needed moving to and from a bed to a chair (including a wheelchair)?: A Little Help needed standing up from a chair using your arms (e.g., wheelchair or bedside chair)?: A Little Help needed to walk in hospital room?: A Little Help needed climbing 3-5 steps with a railing? : Total 6 Click Score: 14    End of Session Equipment Utilized During Treatment: Gait belt;Oxygen Activity Tolerance: Patient tolerated treatment well Patient left: in chair;with call bell/phone within reach;with chair alarm set Nurse Communication: Mobility status PT Visit Diagnosis: Other abnormalities of gait and mobility (R26.89);Muscle weakness (generalized) (M62.81)     Time: 1103-1594 PT Time Calculation (min) (ACUTE ONLY): 42 min  Charges:  $Gait Training: 23-37 mins                     Tresa Endo PT Acute Rehabilitation Services Pager [phone] Office [phone]    Claretha Cooper 11/17/2018, 1:11 PM

## 2018-11-17 NOTE — Progress Notes (Signed)
Pharmacy Antibiotic Note  Reginald [name] is a 83 y.o. male admitted on 11/15/2018 with pneumonia.  Pharmacy has been consulted for Vancomycin and Cefepime dosing.  Plan:  Vanc 2.5 g and Cefepime 2g one time doses given on 5/7 PM.  Cefepime 2g IV q12h  Vancomycin 1250 mg IV Q 24 hrs.   Goal AUC 400-550.  Expected AUC: 511 using SCr 1.5   Height: 5\' 10"  (177.8 cm) Weight: (!) 335 lb 1.6 oz (152 kg) IBW/kg (Calculated) : 73  Temp (24hrs), Avg:99.6 F (37.6 C), Min:98.2 F (36.8 C), Max:102.8 F (39.3 C)  Recent Labs  Lab 11/15/18 1859 11/15/18 1901 11/15/18 2007 11/16/18 0200 11/16/18 0745 11/17/18 0405  WBC  --  9.4  --   --  6.3 3.8*  CREATININE  --   --  1.83*  --  1.73* 1.82*  LATICACIDVEN 2.0*  --   --  1.5 1.2  --     Estimated Creatinine Clearance: 47.9 mL/min (A) (by C-G formula based on SCr of 1.82 mg/dL (H)).    Allergies  Allergen Reactions  . Adhesive [Tape] Rash    Antimicrobials this admission: 5/7 Vancomycin x1, resume 5/9 >>  5/7 Cefepime x1, resume 5/9 >>  5/8 HCQ >> 5/8 5/8 Vit C/ Zinc >>  5/8 Tocilizumab x1 5/9 Tocilizumab x1  Dose adjustments this admission:   Microbiology results: 5/7 SARS Coronavirus 2: positive 5/7 BCx: ngtd 5/7 Respiratory panel: none detected 5/8 MRSA PCR: positive 5/8 Influenza PCR: neg 5/8 Strep pneumo Ur Ag: neg    Thank you for allowing pharmacy to be a part of this patient's care.  Gretta Arab PharmD, BCPS 11/17/2018 11:11 AM

## 2018-11-18 LAB — COMPREHENSIVE METABOLIC PANEL
ALT: 34 U/L (ref 0–44)
AST: 49 U/L — ABNORMAL HIGH (ref 15–41)
Albumin: 2.9 g/dL — ABNORMAL LOW (ref 3.5–5.0)
Alkaline Phosphatase: 57 U/L (ref 38–126)
Anion gap: 12 (ref 5–15)
BUN: 41 mg/dL — ABNORMAL HIGH (ref 8–23)
CO2: 25 mmol/L (ref 22–32)
Calcium: 8.2 mg/dL — ABNORMAL LOW (ref 8.9–10.3)
Chloride: 101 mmol/L (ref 98–111)
Creatinine, Ser: 1.83 mg/dL — ABNORMAL HIGH (ref 0.61–1.24)
GFR calc Af Amer: 40 mL/min — ABNORMAL LOW (ref >60)
GFR calc non Af Amer: 34 mL/min — ABNORMAL LOW (ref >60)
Glucose, Bld: 322 mg/dL — ABNORMAL HIGH (ref 70–99)
Potassium: 4.3 mmol/L (ref 3.5–5.1)
Sodium: 138 mmol/L (ref 135–145)
Total Bilirubin: 0.8 mg/dL (ref 0.3–1.2)
Total Protein: 6.3 g/dL — ABNORMAL LOW (ref 6.5–8.1)

## 2018-11-18 LAB — GLUCOSE, CAPILLARY
Glucose-Capillary: 339 mg/dL — ABNORMAL HIGH (ref 70–99)
Glucose-Capillary: 447 mg/dL — ABNORMAL HIGH (ref 70–99)
Glucose-Capillary: 458 mg/dL — ABNORMAL HIGH (ref 70–99)
Glucose-Capillary: 444 mg/dL — ABNORMAL HIGH (ref 70–99)

## 2018-11-18 LAB — CBC WITH DIFFERENTIAL/PLATELET
Abs Immature Granulocytes: 0.02 10*3/uL (ref 0.00–0.07)
Basophils Absolute: 0 10*3/uL (ref 0.0–0.1)
Basophils Relative: 0 %
Eosinophils Absolute: 0 10*3/uL (ref 0.0–0.5)
Eosinophils Relative: 0 %
HCT: 39.9 % (ref 39.0–52.0)
Hemoglobin: 12.3 g/dL — ABNORMAL LOW (ref 13.0–17.0)
Immature Granulocytes: 1 %
Lymphocytes Relative: 12 %
Lymphs Abs: 0.3 10*3/uL — ABNORMAL LOW (ref 0.7–4.0)
MCH: 26.5 pg (ref 26.0–34.0)
MCHC: 30.8 g/dL (ref 30.0–36.0)
MCV: 85.8 fL (ref 80.0–100.0)
Monocytes Absolute: 0.1 10*3/uL (ref 0.1–1.0)
Monocytes Relative: 3 %
Neutro Abs: 2.5 10*3/uL (ref 1.7–7.7)
Neutrophils Relative %: 84 %
Platelets: 204 10*3/uL (ref 150–400)
RBC: 4.65 MIL/uL (ref 4.22–5.81)
RDW: 16.4 % — ABNORMAL HIGH (ref 11.5–15.5)
WBC: 2.9 10*3/uL — ABNORMAL LOW (ref 4.0–10.5)
nRBC: 0 % (ref 0.0–0.2)

## 2018-11-18 LAB — FERRITIN: Ferritin: 359 ng/mL — ABNORMAL HIGH (ref 24–336)

## 2018-11-18 LAB — D-DIMER, QUANTITATIVE: D-Dimer, Quant: 1.49 ug/mL-FEU — ABNORMAL HIGH (ref 0.00–0.50)

## 2018-11-18 LAB — MAGNESIUM: Magnesium: 2.2 mg/dL (ref 1.7–2.4)

## 2018-11-18 LAB — TRIGLYCERIDES: Triglycerides: 114 mg/dL (ref <150)

## 2018-11-18 LAB — C-REACTIVE PROTEIN: CRP: 6.2 mg/dL — ABNORMAL HIGH (ref <1.0)

## 2018-11-18 LAB — PHOSPHORUS: Phosphorus: 2.8 mg/dL (ref 2.5–4.6)

## 2018-11-18 MED ORDER — INSULIN GLARGINE 100 UNIT/ML ~~LOC~~ SOLN
15.0000 [IU] | Freq: Every day | SUBCUTANEOUS | Status: DC
  Administered 2018-11-18: 15 [IU] via SUBCUTANEOUS
  Filled 2018-11-18: qty 0.15

## 2018-11-18 MED ORDER — INSULIN ASPART 100 UNIT/ML ~~LOC~~ SOLN
0.0000 [IU] | SUBCUTANEOUS | Status: DC
  Administered 2018-11-18: 20 [IU] via SUBCUTANEOUS
  Administered 2018-11-19: 13:00:00 15 [IU] via SUBCUTANEOUS
  Administered 2018-11-19: 20 [IU] via SUBCUTANEOUS
  Administered 2018-11-19: 11 [IU] via SUBCUTANEOUS
  Administered 2018-11-19 (×2): 20 [IU] via SUBCUTANEOUS
  Administered 2018-11-20: 15 [IU] via SUBCUTANEOUS
  Administered 2018-11-20 (×2): 20 [IU] via SUBCUTANEOUS
  Administered 2018-11-20: 04:00:00 15 [IU] via SUBCUTANEOUS
  Administered 2018-11-20 (×2): 20 [IU] via SUBCUTANEOUS
  Administered 2018-11-20: 12:00:00 15 [IU] via SUBCUTANEOUS
  Administered 2018-11-21: 7 [IU] via SUBCUTANEOUS
  Administered 2018-11-21: 14:00:00 15 [IU] via SUBCUTANEOUS
  Administered 2018-11-21: 04:00:00 11 [IU] via SUBCUTANEOUS
  Administered 2018-11-21: 20 [IU] via SUBCUTANEOUS

## 2018-11-18 MED ORDER — INSULIN ASPART 100 UNIT/ML ~~LOC~~ SOLN
20.0000 [IU] | Freq: Once | SUBCUTANEOUS | Status: AC
  Administered 2018-11-18: 20 [IU] via SUBCUTANEOUS

## 2018-11-18 MED ORDER — INSULIN GLARGINE 100 UNIT/ML ~~LOC~~ SOLN
5.0000 [IU] | SUBCUTANEOUS | Status: AC
  Administered 2018-11-18: 20:00:00 5 [IU] via SUBCUTANEOUS
  Filled 2018-11-18: qty 0.05

## 2018-11-18 MED ORDER — SODIUM CHLORIDE 0.9 % IV SOLN: INTRAVENOUS | Status: DC | PRN

## 2018-11-18 NOTE — Progress Notes (Signed)
At 1100, the pt's son was called and updated regarding the pt's current status.

## 2018-11-18 NOTE — Progress Notes (Addendum)
At [place], Bonnielee Haff, MD was paged regarding the pt's CBG of 447.  [place] Pink gave verbal orders for 20 units of novolog sq once and recheck CBG in an hour.   CBG recheck is 458. MD notified.

## 2018-11-18 NOTE — Progress Notes (Signed)
PROGRESS NOTE  Reginald [name]:[id number] DOB: 09/13/1935 DOA: 11/15/2018  PCP: Guadlupe Spanish, MD  Brief History/Interval Summary: 83 y.o. male with medical history significant for atrial fibrillation, type 2 diabetes mellitus, essential hypertension, HLD, morbid obesity, chronic venous stasis with chronic ulcers who presented to [facility] long ED from SNF with reported shortness of breath and fever.  Per EMS report, noted to be saturating in the mid 80s, initially requiring nonrebreather.  Patient was noted to be febrile.  Chest x-ray showed bibasilar opacities.  Patient was hospitalized for further management.    Reason for Visit: Acute respiratory failure with hypoxia secondary to COVID-19  Consultants: None  Procedures: None  Antibiotics: Anti-infectives (From admission, onward)   Start     Dose/Rate Route Frequency Ordered Stop   11/17/18 1200  vancomycin (VANCOCIN) 1,250 mg in sodium chloride 0.9 % 250 mL IVPB     1,250 mg 166.7 mL/hr over 90 Minutes Intravenous Every 24 hours 11/17/18 1120     11/17/18 1130  ceFEPIme (MAXIPIME) 2 g in sodium chloride 0.9 % 100 mL IVPB     2 g 200 mL/hr over 30 Minutes Intravenous Every 12 hours 11/17/18 1120     11/17/18 1000  hydroxychloroquine (PLAQUENIL) tablet 200 mg  Status:  Discontinued     200 mg Oral 2 times daily 11/16/18 0420 11/16/18 1307   11/16/18 1000  hydroxychloroquine (PLAQUENIL) tablet 400 mg  Status:  Discontinued     400 mg Oral 2 times daily 11/16/18 0420 11/16/18 1307   11/15/18 2345  vancomycin (VANCOCIN) 1,500 mg in sodium chloride 0.9 % 500 mL IVPB     1,500 mg 250 mL/hr over 120 Minutes Intravenous  Once 11/15/18 2335 11/16/18 0205   11/15/18 1915  ceFEPIme (MAXIPIME) 2 g in sodium chloride 0.9 % 100 mL IVPB     2 g 200 mL/hr over 30 Minutes Intravenous  Once 11/15/18 1901 11/15/18 2035   11/15/18 1900  vancomycin (VANCOCIN) IVPB 1000 mg/200 mL premix     1,000 mg 200 mL/hr over 60 Minutes Intravenous  Once  11/15/18 1855 11/15/18 2240       Subjective/Interval History: Patient states that he is feeling fine.  Denies any shortness of breath.  Continues to have a dry cough.  Denies any nausea or vomiting.  Occasional coughing episodes when he has liquids.   Assessment/Plan:  Acute Hypoxic Resp. Failure due to Acute Covid 19 Viral Illness during the ongoing 2020 Covid 19 Pandemic  COVID-19 Labs  Recent Labs    11/15/18 2007  11/16/18 0745 11/17/18 0405 11/18/18 0400  DDIMER 1.85*  --   --  1.52* 1.49*  FERRITIN  --    < > 241 289 359*  LDH 233*  --   --   --   --   CRP  --    < > 8.5* 8.9* 6.2*   < > = values in this interval not displayed.    Lab Results  Component Value Date   SARSCOV2NAA POSITIVE (A) 11/15/2018     Last episode of fever: Temperature 102.8 on 5/8.  Afebrile since then. Oxygen requirements: He was on 6 L yesterday.  Down to 4 L today.  Saturating in the early 90s.    Antibiotics: Patient's hydroxychloroquine was discontinued on 5/8.  Patient remains on vancomycin and cefepime.  MRSA PCR positive.  Discontinue vancomycin today.  Discontinue cefepime after 5 days. Steroids: Continue Solu-Medrol Diuretics: Torsemide continued at home dose Actemra: 2 doses  given, 1 on 5/8 and the other on 5/9. QuantiFERON test: Pending Convalescent Plasma: No clear indication to be considered for use of convalescent plasma. Vitamin C and zinc: Initiated   Prone positioning: Patient encouraged to stay in prone position as much as possible.  However he has limited mobility at baseline and he is obese and points it very hard to be in the prone position.  Still this was encouraged.  Per nursing staff the most he is able to do is to lay on his side. PT and OT is working with him.  Mobilize as much as possible.  Patient does not appear to have a lot of mobility at baseline.  He usually gets around in a wheelchair.  Inflammatory markers and d-dimer as above.  CRP improved to 6.2.   D-dimer stable at 1.49.  Ferritin 359.  Patient was placed on vancomycin and cefepime however he did not get a dose on 5/8.  Blood cultures are negative.  MRSA PCR is positive.  However at this time okay to discontinue vancomycin.  Continue cefepime for a total of 5 days.    The treatment plan and use of medications and known side effects were discussed with patient/family. It was clearly explained that there is no proven definitive treatment for COVID-19 infection yet. Any medications used here are based on case reports/anecdotal data which are not peer-reviewed and has not been studied using randomized control trials.  Complete risks and long-term side effects are unknown, however in the best clinical judgment they seem to be of some clinical benefit rather than medical risks.  Patient/family agree with the treatment plan and want to receive these treatments. Actemra was given.  History of persistent atrial fibrillation Continue metoprolol for rate control.  Patient is chronically anticoagulated with Xarelto.  Acute on chronic kidney disease stage IV Creatinine appears to be stable.  Last creatinine in our system was from 2018 when it was 1.27.  Monitor urine output.  Avoid nephrotoxic agents.  Potassium is 4.3 today.    Essential hypertension Continue home medications. blood pressure is reasonably well controlled.  Continue to monitor.  Chronic diastolic CHF Followed by cardiology, Dr. Zella Richer, as an outpatient.  Last echocardiogram from 2018 showed normal systolic function at 55 to 60%.  Continue beta-blocker.  Patient is on torsemide which is being continued.  Diabetes mellitus type 2 HbA1c 6.9 in 2018.  Home medications held.  Lantus was initiated.  CBGs noted to be higher most likely due to steroids.  We will increase the dose of Lantus.  Continue SSI.   History of peripheral neuropathy Continue gabapentin  History of glaucoma Continue eyedrops  History of hyperlipidemia Continue  home medications.  History of depression Continue Lexapro  Morbid obesity BMI 48.08.   DVT Prophylaxis: On Xarelto  Lab Results  Component Value Date   PLT 204 11/18/2018     PUD Prophylaxis: Pepcid Code Status: Full code Family Communication: Discussed with patient and son on a daily basis. Disposition Plan: Management as outlined above.  Patient is here from a skilled nursing facility.   Medications:  Scheduled: . amLODipine  10 mg Oral Daily  . atorvastatin  20 mg Oral QHS  . Chlorhexidine Gluconate Cloth  6 each Topical Q0600  . escitalopram  5 mg Oral Daily  . famotidine  20 mg Oral Daily  . gabapentin  100 mg Oral TID  . insulin aspart  0-15 Units Subcutaneous TID WC  . insulin aspart  0-5 Units Subcutaneous  QHS  . insulin glargine  10 Units Subcutaneous QHS  . latanoprost  1 drop Both Eyes QHS  . loratadine  10 mg Oral Daily  . magnesium oxide  400 mg Oral Daily  . mouth rinse  15 mL Mouth Rinse BID  . Melatonin  5 mg Oral QHS  . methylPREDNISolone (SOLU-MEDROL) injection  60 mg Intravenous Q12H  . metoprolol tartrate  25 mg Oral Daily  . mupirocin ointment  1 application Nasal BID  . potassium chloride SA  20 mEq Oral Daily  . Rivaroxaban  15 mg Oral Q breakfast  . torsemide  40 mg Oral Daily  . vitamin C  500 mg Oral Daily  . zinc sulfate  220 mg Oral Daily   Continuous: . ceFEPime (MAXIPIME) IV 2 g (11/18/18 6578)  . vancomycin 1,250 mg (11/17/18 1451)   ION:[id number], bisacodyl, chlorpheniramine-HYDROcodone, guaiFENesin-dextromethorphan, HYDROcodone-acetaminophen, ipratropium, magnesium citrate, ondansetron **OR** ondansetron (ZOFRAN) IV, senna-docusate   Objective:  Vital Signs  Vitals:   11/17/18 2300 11/18/18 0514 11/18/18 0517 11/18/18 0800  BP:    130/66  Pulse:      Resp:    16  Temp: 98.1 F (36.7 C) (!) 97.5 F (36.4 C)  98.4 F (36.9 C)  TempSrc: Oral Oral  Oral  SpO2: 98%   90%  Weight:   (!) 146.1 kg   Height:         Intake/Output Summary (Last 24 hours) at 11/18/2018 1105 Last data filed at 11/18/2018 0800 Gross per 24 hour  Intake 514.46 ml  Output 1650 ml  Net -1135.54 ml   Filed Weights   11/15/18 1808 11/18/18 0517  Weight: (!) 152 kg (!) 146.1 kg   General appearance: Awake alert.  In no distress.  Morbidly obese Resp: Mildly tachypneic at rest.  Coarse breath sounds bilaterally.  No rhonchi.  No wheezing heard today.  Few crackles at the bases.  No use of accessory muscles. Cardio: S1-S2 is irregularly irregular.  No S3-S4.  No rubs murmurs or bruit GI: Abdomen is soft.  Nontender nondistended.  Bowel sounds are present normal.  No masses organomegaly Extremities: Both lower extremities covered in dressing Neurologic: Alert and oriented x3.  No focal neurological deficits.      Lab Results:  Data Reviewed: I have personally reviewed following labs and imaging studies  CBC: Recent Labs  Lab 11/15/18 1901 11/16/18 0745 11/17/18 0405 11/18/18 0400  WBC 9.4 6.3 3.8* 2.9*  NEUTROABS 7.4 4.6 2.7 2.5  HGB 14.0 13.0 12.3* 12.3*  HCT 44.0 42.2 40.8 39.9  MCV 85.4 86.1 86.6 85.8  PLT 248 179 201 102    Basic Metabolic Panel: Recent Labs  Lab 11/15/18 2007 11/16/18 0745 11/17/18 0405 11/18/18 0400  NA 136 139 141 138  K 3.5 3.2* 3.0* 4.3  CL 99 102 100 101  CO2 24 26 29 25   GLUCOSE 237* 164* 114* 322*  BUN 28* 30* 31* 41*  CREATININE 1.83* 1.73* 1.82* 1.83*  CALCIUM 8.1* 7.9* 8.0* 8.2*  MG  --  1.9 2.0 2.2  PHOS  --  2.8 3.3 2.8    GFR: Estimated Creatinine Clearance: 46.5 mL/min (A) (by C-G formula based on SCr of 1.83 mg/dL (H)).  Liver Function Tests: Recent Labs  Lab 11/15/18 2007 11/16/18 0745 11/17/18 0405 11/18/18 0400  AST 48* 52* 54* 49*  ALT 33 29 31 34  ALKPHOS 68 58 55 57  BILITOT 0.7 0.7 0.8 0.8  PROT 6.6 6.1* 6.0* 6.3*  ALBUMIN 3.3* 3.0* 2.9* 2.9*     CBG: Recent Labs  Lab 11/16/18 2103 11/17/18 0755 11/17/18 1139 11/17/18 1703  11/17/18 2055  GLUCAP 136* 104* 129* 250* 339*    Lipid Profile: Recent Labs    11/17/18 0405 11/18/18 0400  TRIG 101 114    Anemia Panel: Recent Labs    11/17/18 0405 11/18/18 0400  FERRITIN 289 359*    Recent Results (from the past 240 hour(s))  Blood Culture (routine x 2)     Status: None (Preliminary result)   Collection Time: 11/15/18  6:15 PM  Result Value Ref Range Status   Specimen Description   Final    BLOOD RIGHT ARM Performed at [facility], [place] [address], [place], [place] [address]    Special Requests   Final    BOTTLES DRAWN AEROBIC AND ANAEROBIC Blood Culture adequate volume Performed at [place] [address] [place], [place] [address]    Culture   Final    NO GROWTH 3 DAYS Performed at [facility], [place] [address] [place], [place] [address]    Report Status PENDING  Incomplete  Blood Culture (routine x 2)     Status: None (Preliminary result)   Collection Time: 11/15/18  6:20 PM  Result Value Ref Range Status   Specimen Description   Final    BLOOD LEFT ARM Performed at [facility], [place] [address], [place], [place] [address]    Special Requests   Final    BOTTLES DRAWN AEROBIC AND ANAEROBIC Blood Culture adequate volume Performed at [place] [address] [place], [place] [address]    Culture   Final    NO GROWTH 3 DAYS Performed at [facility], [place] [address], [place], [place] [address]    Report Status PENDING  Incomplete  SARS Coronavirus 2 [facility] order, Performed in [place] hospital lab)     Status: Abnormal   Collection Time: 11/15/18  7:00 PM  Result Value Ref Range Status   SARS Coronavirus 2 POSITIVE (A) NEGATIVE Final    Comment: RESULT CALLED TO, READ BACK BY AND VERIFIED WITH: Susa Day RN 2228 11/15/18 A NAVARRO (NOTE) If result is NEGATIVE SARS-CoV-2 target nucleic acids are NOT DETECTED. The SARS-CoV-2 RNA is generally detectable in upper  and lower  respiratory specimens during the acute phase of infection. The lowest  concentration of SARS-CoV-2 viral copies this assay can detect is 250  copies / mL. A negative result does not preclude SARS-CoV-2 infection  and should not be used as the sole basis for treatment or other  patient management decisions.  A negative result may occur with  improper specimen collection / handling, submission of specimen other  than nasopharyngeal swab, presence of viral mutation(s) within the  areas targeted by this assay, and inadequate number of viral copies  (<250 copies / mL). A negative result must be combined with clinical  observations, patient history, and epidemiological information. If result is POSITIVE SARS-CoV-2 target nucleic acids are DETECTED. T he SARS-CoV-2 RNA is generally detectable in upper and lower  respiratory specimens during the acute phase of infection.  Positive  results are indicative of active infection with SARS-CoV-2.  Clinical  correlation with patient history and other diagnostic information is  necessary to determine patient infection status.  Positive results do  not rule out bacterial infection or co-infection with other viruses. If result is PRESUMPTIVE POSTIVE SARS-CoV-2 nucleic  acids MAY BE PRESENT.   A presumptive positive result was obtained on the submitted specimen  and confirmed on repeat testing.  While 2019 novel coronavirus  (SARS-CoV-2) nucleic acids may be present in the submitted sample  additional confirmatory testing may be necessary for epidemiological  and / or clinical management purposes  to differentiate between  SARS-CoV-2 and other Sarbecovirus currently known to infect humans.  If clinically indicated additional testing with an alternate test  methodology [id number]) is  advised. The SARS-CoV-2 RNA is generally  detectable in upper and lower respiratory specimens during the acute  phase of infection. The expected result is  Negative. Fact Sheet for Patients:  StrictlyIdeas.no Fact Sheet for Healthcare Providers: BankingDealers.co.za This test is not yet approved or cleared by the [place] FDA and has been authorized for detection and/or diagnosis of SARS-CoV-2 by FDA under an Emergency Use Authorization (EUA).  This EUA will remain in effect (meaning this test can be used) for the duration of the COVID-19 declaration under Section 564(b)(1) of the Act, 21 U.S.C. section 360bbb-3(b)(1), unless the authorization is terminated or revoked sooner. Performed at [facility], [place] [address] [place], [place] [address]   Respiratory Panel by PCR     Status: None   Collection Time: 11/15/18  7:00 PM  Result Value Ref Range Status   Adenovirus NOT DETECTED NOT DETECTED Final   Coronavirus 229E NOT DETECTED NOT DETECTED Final    Comment: (NOTE) The Coronavirus on the Respiratory Panel, DOES NOT test for the novel  Coronavirus (2019 nCoV)    Coronavirus HKU1 NOT DETECTED NOT DETECTED Final   Coronavirus NL63 NOT DETECTED NOT DETECTED Final   Coronavirus OC43 NOT DETECTED NOT DETECTED Final   Metapneumovirus NOT DETECTED NOT DETECTED Final   Rhinovirus / Enterovirus NOT DETECTED NOT DETECTED Final   Influenza A NOT DETECTED NOT DETECTED Final   Influenza B NOT DETECTED NOT DETECTED Final   Parainfluenza Virus 1 NOT DETECTED NOT DETECTED Final   Parainfluenza Virus 2 NOT DETECTED NOT DETECTED Final   Parainfluenza Virus 3 NOT DETECTED NOT DETECTED Final   Parainfluenza Virus 4 NOT DETECTED NOT DETECTED Final   Respiratory Syncytial Virus NOT DETECTED NOT DETECTED Final   Bordetella pertussis NOT DETECTED NOT DETECTED Final   Chlamydophila pneumoniae NOT DETECTED NOT DETECTED Final   Mycoplasma pneumoniae NOT DETECTED NOT DETECTED Final    Comment: Performed at [facility], [address] [place], [place] [address]  MRSA PCR Screening      Status: Abnormal   Collection Time: 11/16/18  5:00 AM  Result Value Ref Range Status   MRSA by PCR POSITIVE (A) NEGATIVE Final    Comment:        The GeneXpert MRSA Assay (FDA approved for NASAL specimens only), is one component of a comprehensive MRSA colonization surveillance program. It is not intended to diagnose MRSA infection nor to guide or monitor treatment for MRSA infections. RESULT CALLED TO, READ BACK BY AND VERIFIED WITH: Glennon Mac RN AT 1046 ON 11/16/2018 BY S.VANHOORNE Performed at [facility], [place] [address], [place], [place] [address]       Radiology Studies: Dg Chest Port 1 View  Result Date: 11/16/2018 CLINICAL DATA:  Hypoxia EXAM: PORTABLE CHEST 1 VIEW COMPARISON:  11/15/2018 FINDINGS: Cardiac shadow is stable. Increasing bilateral patchy opacities are noted when compare with the prior exam. No sizable effusion is seen. No bony abnormality is noted. IMPRESSION: Patchy infiltrates slightly increased when  compare with the prior exam. Electronically Signed   By: Inez Catalina M.D.   On: 11/16/2018 20:03       LOS: 3 days   [facility] Pager on [email or web address]  11/18/2018, 11:05 AM

## 2018-11-18 NOTE — Evaluation (Signed)
Clinical/Bedside Swallow Evaluation Patient Details  Name: Reginald [name] MRN: [id number] Date of Birth: 07-Oct-1935  Today's Date: 11/18/2018 Time: SLP Start Time (ACUTE ONLY): 10 SLP Stop Time (ACUTE ONLY): 1328 SLP Time Calculation (min) (ACUTE ONLY): 8 min  Past Medical History:  Past Medical History:  Diagnosis Date  . Anteroseptal myocardial infarction ([place])   . Atrial fibrillation ([facility])    a. chronic, on Xarelto  . CHF (congestive heart failure) ([place])   . Chronic kidney disease   . COPD (chronic obstructive pulmonary disease) ([place])   . Coronary atherosclerosis of unspecified type of vessel, native or graft    a. s/p prior PCI to LAD in 1997  . Diverticulosis   . Glaucoma   . Hypercholesteremia   . Hypertensive renal disease   . Hypokalemia   . Obesity hypoventilation syndrome ([place])   . Obesity, unspecified   . OSA (obstructive sleep apnea)   . Rhabdomyolysis   . Type II or unspecified type diabetes mellitus without mention of complication, not stated as uncontrolled   . Unspecified essential hypertension   . Vestibulopathy    Past Surgical History:  Past Surgical History:  Procedure Laterality Date  . APPENDECTOMY  1985  . CARDIAC CATHETERIZATION    . CHOLECYSTECTOMY  06/2000  . CORONARY ANGIOPLASTY  08/14/1995   stent placement to LAD   . ENDOVENOUS ABLATION SAPHENOUS VEIN W/ LASER Left 02/21/2018   endovenous laser ablation L GSV by Ruta [place]s MD   . [place] Right 1985  . KNEE ARTHROSCOPY Left 1991  . LUMBAR FUSION    . NOSE SURGERY  1970s   Per Dr. Terrence Dupont PSH in pt chart   HPI:  83 y.o. male with medical history significant for atrial fibrillation, type 2 diabetes mellitus, essential hypertension, HLD, morbid obesity, chronic venous stasis with chronic ulcers who presented to [facility] long ED from SNF with reported shortness of breath and fever.  Per EMS report, noted to be saturating in the mid 80s, initially requiring nonrebreather.   Patient was noted to be febrile.  Chest x-ray showed bibasilar opacities.  Patient was hospitalized for further management. Found to have Acute respiratory failure with hypoxia secondary to COVID-19. Per MD order pt has been coughing with liquids.    Assessment / Plan / Recommendation Clinical Impression  Pt demonstrates adequate swallow function for his age. He does report occasionally coughing with thin liquids "if I get it too fast." Observed pt with two full cups of water and one cup of orange juice all with a  straw with multiple consecutive swallows. Overall, intake appeared normal, though there was one instance consistent with pts description; He took a large sip and had to stop to immediately cough. Pt response appeared quite appropriate, cough was immediate and strong and pts otherwise did not cough during session and vocal quality remained dry, all of which are indicators of intact pharyngeal sesation. Occasional sensed penetration and aspiration is not outside of normal limits for pts age. Would encourage staff to monitor for increased severity and frequency and encourage basic precautions such as upright positioning. Will keep pt on caseload to f/u with RN and check chart, but further direct intervention may not be needed.  SLP Visit Diagnosis: Dysphagia, unspecified (R13.10)    Aspiration Risk  Mild aspiration risk    Diet Recommendation Regular;Thin liquid   Liquid Administration via: Cup;Straw Medication Administration: Whole meds with liquid Supervision: Patient able to self feed Compensations: Minimize environmental distractions Postural Changes:  Seated upright at 90 degrees    Other  Recommendations     Follow up Recommendations Skilled Nursing facility      Frequency and Duration min 1 x/week  2 weeks       Prognosis        Swallow Study   General HPI: 83 y.o. male with medical history significant for atrial fibrillation, type 2 diabetes mellitus, essential  hypertension, HLD, morbid obesity, chronic venous stasis with chronic ulcers who presented to [facility] long ED from SNF with reported shortness of breath and fever.  Per EMS report, noted to be saturating in the mid 80s, initially requiring nonrebreather.  Patient was noted to be febrile.  Chest x-ray showed bibasilar opacities.  Patient was hospitalized for further management. Found to have Acute respiratory failure with hypoxia secondary to COVID-19. Per MD order pt has been coughing with liquids.  Type of Study: Bedside Swallow Evaluation Previous Swallow Assessment: none Diet Prior to this Study: Regular;Thin liquids Temperature Spikes Noted: No History of Recent Intubation: No Behavior/Cognition: Alert;Cooperative;Pleasant mood Oral Cavity Assessment: Within Functional Limits Oral Care Completed by SLP: No Oral Cavity - Dentition: Adequate natural dentition Vision: Functional for self-feeding Self-Feeding Abilities: Able to feed self Patient Positioning: Upright in bed Baseline Vocal Quality: Normal Volitional Cough: Strong Volitional Swallow: Able to elicit    Oral/Motor/Sensory Function Overall Oral Motor/Sensory Function: Within functional limits   Ice Chips     Thin Liquid Thin Liquid: Impaired Presentation: Cup;Straw;Self Fed Pharyngeal  Phase Impairments: Cough - Immediate    Nectar Thick     Honey Thick     Puree Puree: Within functional limits   Solid     Solid: Within functional limits     Herbie Baltimore, MA [place] Pager [phone] Office [phone]  Arlee Bossard, Katherene Ponto 11/18/2018,1:35 PM

## 2018-11-19 LAB — CBC WITH DIFFERENTIAL/PLATELET
Abs Immature Granulocytes: 0.09 10*3/uL — ABNORMAL HIGH (ref 0.00–0.07)
Basophils Absolute: 0 10*3/uL (ref 0.0–0.1)
Basophils Relative: 0 %
Eosinophils Absolute: 0 10*3/uL (ref 0.0–0.5)
Eosinophils Relative: 0 %
HCT: 38.8 % — ABNORMAL LOW (ref 39.0–52.0)
Hemoglobin: 12 g/dL — ABNORMAL LOW (ref 13.0–17.0)
Immature Granulocytes: 1 %
Lymphocytes Relative: 6 %
Lymphs Abs: 0.5 10*3/uL — ABNORMAL LOW (ref 0.7–4.0)
MCH: 26.9 pg (ref 26.0–34.0)
MCHC: 30.9 g/dL (ref 30.0–36.0)
MCV: 87 fL (ref 80.0–100.0)
Monocytes Absolute: 0.4 10*3/uL (ref 0.1–1.0)
Monocytes Relative: 4 %
Neutro Abs: 7.5 10*3/uL (ref 1.7–7.7)
Neutrophils Relative %: 89 %
Platelets: 209 10*3/uL (ref 150–400)
RBC: 4.46 MIL/uL (ref 4.22–5.81)
RDW: 16 % — ABNORMAL HIGH (ref 11.5–15.5)
WBC: 8.4 10*3/uL (ref 4.0–10.5)
nRBC: 0 % (ref 0.0–0.2)

## 2018-11-19 LAB — TRIGLYCERIDES: Triglycerides: 144 mg/dL (ref <150)

## 2018-11-19 LAB — COMPREHENSIVE METABOLIC PANEL
ALT: 33 U/L (ref 0–44)
AST: 34 U/L (ref 15–41)
Albumin: 2.7 g/dL — ABNORMAL LOW (ref 3.5–5.0)
Alkaline Phosphatase: 55 U/L (ref 38–126)
Anion gap: 10 (ref 5–15)
BUN: 56 mg/dL — ABNORMAL HIGH (ref 8–23)
CO2: 27 mmol/L (ref 22–32)
Calcium: 8.5 mg/dL — ABNORMAL LOW (ref 8.9–10.3)
Chloride: 100 mmol/L (ref 98–111)
Creatinine, Ser: 2.2 mg/dL — ABNORMAL HIGH (ref 0.61–1.24)
GFR calc Af Amer: 32 mL/min — ABNORMAL LOW (ref >60)
GFR calc non Af Amer: 27 mL/min — ABNORMAL LOW (ref >60)
Glucose, Bld: 380 mg/dL — ABNORMAL HIGH (ref 70–99)
Potassium: 4.3 mmol/L (ref 3.5–5.1)
Sodium: 137 mmol/L (ref 135–145)
Total Bilirubin: 0.5 mg/dL (ref 0.3–1.2)
Total Protein: 6 g/dL — ABNORMAL LOW (ref 6.5–8.1)

## 2018-11-19 LAB — GLUCOSE, CAPILLARY
Glucose-Capillary: 306 mg/dL — ABNORMAL HIGH (ref 70–99)
Glucose-Capillary: 467 mg/dL — ABNORMAL HIGH (ref 70–99)
Glucose-Capillary: 373 mg/dL — ABNORMAL HIGH (ref 70–99)
Glucose-Capillary: 294 mg/dL — ABNORMAL HIGH (ref 70–99)
Glucose-Capillary: 347 mg/dL — ABNORMAL HIGH (ref 70–99)
Glucose-Capillary: 372 mg/dL — ABNORMAL HIGH (ref 70–99)
Glucose-Capillary: 457 mg/dL — ABNORMAL HIGH (ref 70–99)

## 2018-11-19 LAB — MAGNESIUM: Magnesium: 2.3 mg/dL (ref 1.7–2.4)

## 2018-11-19 LAB — PHOSPHORUS: Phosphorus: 2.9 mg/dL (ref 2.5–4.6)

## 2018-11-19 LAB — LEGIONELLA PNEUMOPHILA SEROGP 1 UR AG: L. pneumophila Serogp 1 Ur Ag: NEGATIVE

## 2018-11-19 LAB — C-REACTIVE PROTEIN: CRP: 1.8 mg/dL — ABNORMAL HIGH (ref <1.0)

## 2018-11-19 LAB — D-DIMER, QUANTITATIVE: D-Dimer, Quant: 0.96 ug/mL-FEU — ABNORMAL HIGH (ref 0.00–0.50)

## 2018-11-19 LAB — FERRITIN: Ferritin: 456 ng/mL — ABNORMAL HIGH (ref 24–336)

## 2018-11-19 MED ORDER — POLYVINYL ALCOHOL 1.4 % OP SOLN
1.0000 [drp] | OPHTHALMIC | Status: DC | PRN
  Filled 2018-11-19: qty 15

## 2018-11-19 MED ORDER — INSULIN GLARGINE 100 UNIT/ML ~~LOC~~ SOLN
15.0000 [IU] | Freq: Two times a day (BID) | SUBCUTANEOUS | Status: DC
  Administered 2018-11-19 – 2018-11-27 (×16): 15 [IU] via SUBCUTANEOUS
  Filled 2018-11-19 (×17): qty 0.15

## 2018-11-19 MED ORDER — INSULIN ASPART 100 UNIT/ML ~~LOC~~ SOLN
10.0000 [IU] | Freq: Once | SUBCUTANEOUS | Status: AC
  Administered 2018-11-19: 22:00:00 10 [IU] via SUBCUTANEOUS

## 2018-11-19 MED ORDER — INSULIN GLARGINE 100 UNIT/ML ~~LOC~~ SOLN
20.0000 [IU] | Freq: Every day | SUBCUTANEOUS | Status: DC
  Administered 2018-11-19: 09:00:00 20 [IU] via SUBCUTANEOUS
  Filled 2018-11-19: qty 0.2

## 2018-11-19 MED ORDER — METHYLPREDNISOLONE SODIUM SUCC 125 MG IJ SOLR
40.0000 mg | Freq: Two times a day (BID) | INTRAMUSCULAR | Status: DC
  Administered 2018-11-19 – 2018-11-20 (×3): 40 mg via INTRAVENOUS
  Filled 2018-11-19 (×3): qty 2

## 2018-11-19 NOTE — Progress Notes (Signed)
PROGRESS NOTE  Reginald [name] TKP:[id number] DOB: 1935/07/16 DOA: 11/15/2018  PCP: Guadlupe Spanish, MD  Brief History/Interval Summary: 83 y.o. male with medical history significant for atrial fibrillation, type 2 diabetes mellitus, essential hypertension, HLD, morbid obesity, chronic venous stasis with chronic ulcers who presented to [facility] long ED from SNF with reported shortness of breath and fever.  Per EMS report, noted to be saturating in the mid 80s, initially requiring nonrebreather.  Patient was noted to be febrile.  Chest x-ray showed bibasilar opacities.  Patient was hospitalized for further management.    Reason for Visit: Acute respiratory failure with hypoxia secondary to COVID-19  Consultants: None  Procedures: None  Antibiotics: Anti-infectives (From admission, onward)   Start     Dose/Rate Route Frequency Ordered Stop   11/17/18 1200  vancomycin (VANCOCIN) 1,250 mg in sodium chloride 0.9 % 250 mL IVPB  Status:  Discontinued     1,250 mg 166.7 mL/hr over 90 Minutes Intravenous Every 24 hours 11/17/18 1120 11/18/18 1116   11/17/18 1130  ceFEPIme (MAXIPIME) 2 g in sodium chloride 0.9 % 100 mL IVPB     2 g 200 mL/hr over 30 Minutes Intravenous Every 12 hours 11/17/18 1120     11/17/18 1000  hydroxychloroquine (PLAQUENIL) tablet 200 mg  Status:  Discontinued     200 mg Oral 2 times daily 11/16/18 0420 11/16/18 1307   11/16/18 1000  hydroxychloroquine (PLAQUENIL) tablet 400 mg  Status:  Discontinued     400 mg Oral 2 times daily 11/16/18 0420 11/16/18 1307   11/15/18 2345  vancomycin (VANCOCIN) 1,500 mg in sodium chloride 0.9 % 500 mL IVPB     1,500 mg 250 mL/hr over 120 Minutes Intravenous  Once 11/15/18 2335 11/16/18 0205   11/15/18 1915  ceFEPIme (MAXIPIME) 2 g in sodium chloride 0.9 % 100 mL IVPB     2 g 200 mL/hr over 30 Minutes Intravenous  Once 11/15/18 1901 11/15/18 2035   11/15/18 1900  vancomycin (VANCOCIN) IVPB 1000 mg/200 mL premix     1,000 mg 200 mL/hr over  60 Minutes Intravenous  Once 11/15/18 1855 11/15/18 2240       Subjective/Interval History: Patient states that he feels well.  Slept well overnight.  Denies any shortness of breath.  Mild cough.     Assessment/Plan:  Acute Hypoxic Resp. Failure due to Acute Covid 19 Viral Illness during the ongoing 2020 Covid 19 Pandemic  COVID-19 Labs  Recent Labs    11/17/18 0405 11/18/18 0400 11/19/18 0500 11/19/18 0505  DDIMER 1.52* 1.49* 0.96*  --   FERRITIN 289 359*  --  456*  CRP 8.9* 6.2*  --  1.8*    Lab Results  Component Value Date   SARSCOV2NAA POSITIVE (A) 11/15/2018     Last episode of fever: Temperature 102.8 on 5/8.  Afebrile since then. Oxygen requirements: Patient noted to be saturating in the late 90s on 4 L.  Dropped down to 3 L.    Antibiotics: Patient's hydroxychloroquine was discontinued on 5/8.  He was also placed on vancomycin and cefepime.  Based on negative cultures vancomycin was discontinued.  Continue cefepime for 5 days.  MRSA PCR was noted to be positive.  Steroids: Taper Solu-Medrol.  Change to oral tomorrow. Diuretics: Torsemide continued at home dose Actemra: 2 doses given, 1 on 5/8 and the other on 5/9. QuantiFERON test: Pending Convalescent Plasma: No clear indication to be considered for use of convalescent plasma. Vitamin C and zinc: Continue  Prone positioning: Patient encouraged to stay in prone position as much as possible.  However he has limited mobility at baseline and he is obese and points it very hard to be in the prone position.  Still this was encouraged.  Per nursing staff the most he is able to do is to lay on his side.  PT and OT continues to work with him.  Mobilize as much as possible.  Okay to discontinue Foley catheter today.  Patient does not appear to be functional at baseline.  He usually gets around in a wheelchair.  Inflammatory markers and d-dimer as above.  CRP has improved to 1.8.  D-dimer 0.96.  Ferritin 456.    Patient  was placed on vancomycin and cefepime however he did not get a dose on 5/8.  Blood cultures are negative.  MRSA PCR is positive.  Vancomycin was discontinued.  Continue cefepime for a total of 5 days.    The treatment plan and use of medications and known side effects were discussed with patient/family. It was clearly explained that there is no proven definitive treatment for COVID-19 infection yet. Any medications used here are based on case reports/anecdotal data which are not peer-reviewed and has not been studied using randomized control trials.  Complete risks and long-term side effects are unknown, however in the best clinical judgment they seem to be of some clinical benefit rather than medical risks.  Patient/family agree with the treatment plan and want to receive these treatments. Actemra was given.  History of persistent atrial fibrillation Continue metoprolol for rate control.  Patient is chronically anticoagulated with Xarelto.  Cardiac status is stable.  Acute on chronic kidney disease stage IV Increase in creatinine noted today to 2.2.  BUN is also elevated.  We will hold his torsemide.  Monitor urine output.  Last creatinine in our system was from 2018 when it was 1.27.  Monitor urine output.  Avoid nephrotoxic agents.  Potassium is 4.3 today.    Essential hypertension Continue home medications. blood pressure is reasonably well controlled.  Continue to monitor.  Chronic diastolic CHF Followed by cardiology, Dr. Zella Richer, as an outpatient.  Last echocardiogram from 2018 showed normal systolic function at 55 to 60%.  Continue beta-blocker.  Patient is on torsemide at home.  This will be held due to elevated creatinine.  Diabetes mellitus type 2 HbA1c 6.9 in 2018.  Hyperglycemia due to steroids.  Should improve as steroid is tapered down.  Dose of Lantus was also increased.  Continue SSI.    History of peripheral neuropathy Continue gabapentin  History of glaucoma Continue  eyedrops  History of hyperlipidemia Continue home medications.  History of depression Continue Lexapro  Morbid obesity BMI 48.08.   DVT Prophylaxis: On Xarelto  Lab Results  Component Value Date   PLT 209 11/19/2018     PUD Prophylaxis: Pepcid Code Status: Full code Family Communication: Discussed with patient and son on a daily basis. Disposition Plan: Management as outlined above.  Patient is here from a skilled nursing facility.   Medications:  Scheduled: . amLODipine  10 mg Oral Daily  . atorvastatin  20 mg Oral QHS  . Chlorhexidine Gluconate Cloth  6 each Topical Q0600  . escitalopram  5 mg Oral Daily  . famotidine  20 mg Oral Daily  . gabapentin  100 mg Oral TID  . insulin aspart  0-20 Units Subcutaneous Q4H  . insulin glargine  20 Units Subcutaneous Daily  . latanoprost  1 drop Both  Eyes QHS  . loratadine  10 mg Oral Daily  . magnesium oxide  400 mg Oral Daily  . mouth rinse  15 mL Mouth Rinse BID  . Melatonin  5 mg Oral QHS  . methylPREDNISolone (SOLU-MEDROL) injection  40 mg Intravenous Q12H  . metoprolol tartrate  25 mg Oral Daily  . mupirocin ointment  1 application Nasal BID  . potassium chloride SA  20 mEq Oral Daily  . Rivaroxaban  15 mg Oral Q breakfast  . torsemide  40 mg Oral Daily  . vitamin C  500 mg Oral Daily  . zinc sulfate  220 mg Oral Daily   Continuous: . sodium chloride    . ceFEPime (MAXIPIME) IV 2 g (11/19/18 0922)   HQI:ONGEXB chloride, acetaminophen, bisacodyl, chlorpheniramine-HYDROcodone, guaiFENesin-dextromethorphan, HYDROcodone-acetaminophen, ipratropium, magnesium citrate, ondansetron **OR** ondansetron (ZOFRAN) IV, senna-docusate   Objective:  Vital Signs  Vitals:   11/19/18 0358 11/19/18 0400 11/19/18 0754 11/19/18 0905  BP: 133/72 97/81  (!) 126/56  Pulse: (!) 56   73  Resp: 19 18    Temp: 97.7 F (36.5 C)  97.6 F (36.4 C)   TempSrc: Oral  Oral   SpO2: 92% 92%    Weight:      Height:         Intake/Output Summary (Last 24 hours) at 11/19/2018 1145 Last data filed at 11/19/2018 0754 Gross per 24 hour  Intake 360 ml  Output 2050 ml  Net -1690 ml   Filed Weights   11/15/18 1808 11/18/18 0517  Weight: (!) 152 kg (!) 146.1 kg    General appearance: Awake alert.  In no distress.  Morbidly obese Resp: Normal effort at rest.  Improved aeration bilaterally.  No wheezing rales or rhonchi.   Cardio: S1-S2 is irregularly irregular.  No S3-S4.  No rubs murmurs or bruit GI: Abdomen is soft.  Nontender nondistended.  Bowel sounds are present normal.  No masses organomegaly Extremities: No edema.  Lower extremities covered in dressing. Neurologic: Alert and oriented x3.  No focal neurological deficits.    Lab Results:  Data Reviewed: I have personally reviewed following labs and imaging studies  CBC: Recent Labs  Lab 11/15/18 1901 11/16/18 0745 11/17/18 0405 11/18/18 0400 11/19/18 0500  WBC 9.4 6.3 3.8* 2.9* 8.4  NEUTROABS 7.4 4.6 2.7 2.5 7.5  HGB 14.0 13.0 12.3* 12.3* 12.0*  HCT 44.0 42.2 40.8 39.9 38.8*  MCV 85.4 86.1 86.6 85.8 87.0  PLT 248 179 201 204 284    Basic Metabolic Panel: Recent Labs  Lab 11/15/18 2007 11/16/18 0745 11/17/18 0405 11/18/18 0400 11/19/18 0500  NA 136 139 141 138 137  K 3.5 3.2* 3.0* 4.3 4.3  CL 99 102 100 101 100  CO2 24 26 29 25 27   GLUCOSE 237* 164* 114* 322* 380*  BUN 28* 30* 31* 41* 56*  CREATININE 1.83* 1.73* 1.82* 1.83* 2.20*  CALCIUM 8.1* 7.9* 8.0* 8.2* 8.5*  MG  --  1.9 2.0 2.2 2.3  PHOS  --  2.8 3.3 2.8 2.9    GFR: Estimated Creatinine Clearance: 38.7 mL/min (A) (by C-G formula based on SCr of 2.2 mg/dL (H)).  Liver Function Tests: Recent Labs  Lab 11/15/18 2007 11/16/18 0745 11/17/18 0405 11/18/18 0400 11/19/18 0500  AST 48* 52* 54* 49* 34  ALT 33 29 31 34 33  ALKPHOS 68 58 55 57 55  BILITOT 0.7 0.7 0.8 0.8 0.5  PROT 6.6 6.1* 6.0* 6.3* 6.0*  ALBUMIN 3.3* 3.0* 2.9* 2.9*  2.7*     CBG: Recent Labs   Lab 11/18/18 1856 11/18/18 1923 11/18/18 2310 11/19/18 0357 11/19/18 0749  GLUCAP 458* 467* 444* 373* 294*    Lipid Profile: Recent Labs    11/18/18 0400 11/19/18 0500  TRIG 114 144    Anemia Panel: Recent Labs    11/18/18 0400 11/19/18 0505  FERRITIN 359* 456*    Recent Results (from the past 240 hour(s))  Blood Culture (routine x 2)     Status: None (Preliminary result)   Collection Time: 11/15/18  6:15 PM  Result Value Ref Range Status   Specimen Description   Final    BLOOD RIGHT ARM Performed at [facility], [place] [address], [place], [place] [address]    Special Requests   Final    BOTTLES DRAWN AEROBIC AND ANAEROBIC Blood Culture adequate volume Performed at [place] [address], [place], [place] [address]    Culture   Final    NO GROWTH 4 DAYS Performed at [facility], [place] [address], [place], [place] [address]    Report Status PENDING  Incomplete  Blood Culture (routine x 2)     Status: None (Preliminary result)   Collection Time: 11/15/18  6:20 PM  Result Value Ref Range Status   Specimen Description   Final    BLOOD LEFT ARM Performed at [facility], [place] [address], [place], [place] [address]    Special Requests   Final    BOTTLES DRAWN AEROBIC AND ANAEROBIC Blood Culture adequate volume Performed at [place] [address], [place],  [address]    Culture   Final    NO GROWTH 4 DAYS Performed at [facility], [place] [address], [place],  [address]    Report Status PENDING  Incomplete  SARS Coronavirus 2 [facility] order, Performed in [place] hospital lab)     Status: Abnormal   Collection Time: 11/15/18  7:00 PM  Result Value Ref Range Status   SARS Coronavirus 2 POSITIVE (A) NEGATIVE Final    Comment: RESULT CALLED TO, READ BACK BY AND VERIFIED WITH: Susa Day RN 2228 11/15/18 A NAVARRO (NOTE) If result is NEGATIVE SARS-CoV-2 target nucleic acids are  NOT DETECTED. The SARS-CoV-2 RNA is generally detectable in upper and lower  respiratory specimens during the acute phase of infection. The lowest  concentration of SARS-CoV-2 viral copies this assay can detect is 250  copies / mL. A negative result does not preclude SARS-CoV-2 infection  and should not be used as the sole basis for treatment or other  patient management decisions.  A negative result may occur with  improper specimen collection / handling, submission of specimen other  than nasopharyngeal swab, presence of viral mutation(s) within the  areas targeted by this assay, and inadequate number of viral copies  (<250 copies / mL). A negative result must be combined with clinical  observations, patient history, and epidemiological information. If result is POSITIVE SARS-CoV-2 target nucleic acids are DETECTED. T he SARS-CoV-2 RNA is generally detectable in upper and lower  respiratory specimens during the acute phase of infection.  Positive  results are indicative of active infection with SARS-CoV-2.  Clinical  correlation with patient history and other diagnostic information is  necessary to determine patient infection status.  Positive results do  not rule out bacterial infection or co-infection with other viruses. If result is PRESUMPTIVE POSTIVE SARS-CoV-2 nucleic acids MAY BE PRESENT.  A presumptive positive result was obtained on the submitted specimen  and confirmed on repeat testing.  While 2019 novel coronavirus  (SARS-CoV-2) nucleic acids may be present in the submitted sample  additional confirmatory testing may be necessary for epidemiological  and / or clinical management purposes  to differentiate between  SARS-CoV-2 and other Sarbecovirus currently known to infect humans.  If clinically indicated additional testing with an alternate test  methodology [phone]) is  advised. The SARS-CoV-2 RNA is generally  detectable in upper and lower respiratory specimens  during the acute  phase of infection. The expected result is Negative. Fact Sheet for Patients:  StrictlyIdeas.no Fact Sheet for Healthcare Providers: BankingDealers.co.za This test is not yet approved or cleared by the [place] FDA and has been authorized for detection and/or diagnosis of SARS-CoV-2 by FDA under an Emergency Use Authorization (EUA).  This EUA will remain in effect (meaning this test can be used) for the duration of the COVID-19 declaration under Section 564(b)(1) of the Act, 21 U.S.C. section 360bbb-3(b)(1), unless the authorization is terminated or revoked sooner. Performed at [facility], [place] [address] [place], [place] [address]   Respiratory Panel by PCR     Status: None   Collection Time: 11/15/18  7:00 PM  Result Value Ref Range Status   Adenovirus NOT DETECTED NOT DETECTED Final   Coronavirus 229E NOT DETECTED NOT DETECTED Final    Comment: (NOTE) The Coronavirus on the Respiratory Panel, DOES NOT test for the novel  Coronavirus (2019 nCoV)    Coronavirus HKU1 NOT DETECTED NOT DETECTED Final   Coronavirus NL63 NOT DETECTED NOT DETECTED Final   Coronavirus OC43 NOT DETECTED NOT DETECTED Final   Metapneumovirus NOT DETECTED NOT DETECTED Final   Rhinovirus / Enterovirus NOT DETECTED NOT DETECTED Final   Influenza A NOT DETECTED NOT DETECTED Final   Influenza B NOT DETECTED NOT DETECTED Final   Parainfluenza Virus 1 NOT DETECTED NOT DETECTED Final   Parainfluenza Virus 2 NOT DETECTED NOT DETECTED Final   Parainfluenza Virus 3 NOT DETECTED NOT DETECTED Final   Parainfluenza Virus 4 NOT DETECTED NOT DETECTED Final   Respiratory Syncytial Virus NOT DETECTED NOT DETECTED Final   Bordetella pertussis NOT DETECTED NOT DETECTED Final   Chlamydophila pneumoniae NOT DETECTED NOT DETECTED Final   Mycoplasma pneumoniae NOT DETECTED NOT DETECTED Final    Comment: Performed at [facility]  Lab, [address] [place], [place] [address]  MRSA PCR Screening     Status: Abnormal   Collection Time: 11/16/18  5:00 AM  Result Value Ref Range Status   MRSA by PCR POSITIVE (A) NEGATIVE Final    Comment:        The GeneXpert MRSA Assay (FDA approved for NASAL specimens only), is one component of a comprehensive MRSA colonization surveillance program. It is not intended to diagnose MRSA infection nor to guide or monitor treatment for MRSA infections. RESULT CALLED TO, READ BACK BY AND VERIFIED WITH: Glennon Mac RN AT 1046 ON 11/16/2018 BY S.VANHOORNE Performed at [facility], [place] [address] [place], [place] [address]       Radiology Studies: No results found.     LOS: 4 days   Holger Sokolowski [employer] on [email or web address]  11/19/2018, 11:45 AM

## 2018-11-19 NOTE — Progress Notes (Signed)
Occupational Therapy Treatment Patient Details Name: ATWOOD ADCOCK MRN: [id number] DOB: 08/09/1935 Today's Date: 11/19/2018    History of present illness Pt adm with SOB and fever. Pt with PNA and COVID 19 positiive. PMH - morbid obesity, ckd, dm, chronic venous stasis, copd, chf, MI, afib, lumbar fusion   OT comments  Pt progressing towards established OT goals. Pt performing toileting and peri care with Min Guard-Min A for balance and safety. Pt performing functional mobility in hallway with Min A and RW maintaining SpO2 >90% on RA. Continue to recommend dc to SNF and will continue to follow acutely as admitted.    Follow Up Recommendations  SNF;Supervision/Assistance - 24 hour    Equipment Recommendations  Other (comment)(TBA)    Recommendations for Other Services      Precautions / Restrictions Precautions Precautions: Fall Precaution Comments: monitor O2 sats, states legs give out Restrictions Weight Bearing Restrictions: No       Mobility Bed Mobility               General bed mobility comments: In recliner upon arrival  Transfers Overall transfer level: Needs assistance Equipment used: Rolling walker (2 wheeled) Transfers: Sit to/from Stand Sit to Stand: Min assist         General transfer comment: Assist to bring hips up and for balance. Verbal cues for hand placementfrom recliner x2    Balance Overall balance assessment: Needs assistance Sitting-balance support: No upper extremity supported;Feet supported Sitting balance-Leahy Scale: Fair     Standing balance support: Single extremity supported;During functional activity Standing balance-Leahy Scale: Poor Standing balance comment: Performing peri care with single hand on walker                           ADL either performed or assessed with clinical judgement   ADL Overall ADL's : Needs assistance/impaired     Grooming: Min guard;Wash/dry hands;Standing Grooming Details  (indicate cue type and reason): Pt performing grooming at sink with Min Guard A for safety                 Toilet Transfer: Minimal assistance;Min guard;Ambulation;Regular Toilet;RW Armed forces technical officer Details (indicate cue type and reason): Min Guard A for safety to lower and Min A for power up into standing Toileting- Water quality scientist and Hygiene: Min guard;Sit to/from stand Toileting - Clothing Manipulation Details (indicate cue type and reason): Pt performing peri care at sink with wash clothe and Min Guard A for safety     Functional mobility during ADLs: Minimal assistance;Rolling walker;Min guard General ADL Comments: Pt performing toileting and peri care with Min Guard-Min A. Demonstrating increased activity tolerance and performing on RA. SpO2 >88% throughout     Vision       Perception     Praxis      Cognition Arousal/Alertness: Awake/alert Behavior During Therapy: WFL for tasks assessed/performed Overall Cognitive Status: Within Functional Limits for tasks assessed                                 General Comments: pt HOH requiring increased time and often repetition of instruction, alert and oriented basics        Exercises     Shoulder Instructions       General Comments SpO2 >88% on RA    Pertinent Vitals/ Pain       Pain Assessment: No/denies pain  Home  Living                                          Prior Functioning/Environment              Frequency  Min 2X/week        Progress Toward Goals  OT Goals(current goals can now be found in the care plan section)  Progress towards OT goals: Progressing toward goals  Acute Rehab OT Goals Patient Stated Goal: Get up out of bed OT Goal Formulation: With patient Time For Goal Achievement: 11/30/18 Potential to Achieve Goals: Good ADL Goals Pt Will Perform Grooming: with supervision;standing Pt Will Perform Lower Body Bathing: with min guard assist;sit  to/from stand Pt Will Perform Upper Body Dressing: with set-up;sitting Pt Will Perform Lower Body Dressing: with min guard assist;sit to/from stand Pt Will Transfer to Toilet: with supervision;ambulating Pt Will Perform Toileting - Clothing Manipulation and hygiene: with supervision;sit to/from stand Pt/caregiver will Perform Home Exercise Program: Increased ROM;Increased strength;Both right and left upper extremity;With Supervision  Plan Discharge plan remains appropriate    Co-evaluation    PT/OT/SLP Co-Evaluation/Treatment: Yes Reason for Co-Treatment: For patient/therapist safety;To address functional/ADL transfers   OT goals addressed during session: ADL's and self-care      AM-PAC OT "6 Clicks" Daily Activity     Outcome Measure   Help from another person eating meals?: None Help from another person taking care of personal grooming?: A Little Help from another person toileting, which includes using toliet, bedpan, or urinal?: A Lot Help from another person bathing (including washing, rinsing, drying)?: A Lot Help from another person to put on and taking off regular upper body clothing?: A Little Help from another person to put on and taking off regular lower body clothing?: A Lot 6 Click Score: 16    End of Session Equipment Utilized During Treatment: Rolling walker;Oxygen;Gait belt  OT Visit Diagnosis: Muscle weakness (generalized) (M62.81);Unsteadiness on feet (R26.81)   Activity Tolerance Patient tolerated treatment well   Patient Left with call bell/phone within reach;in chair;with chair alarm set   Nurse Communication Mobility status        Time: 1660-6301 OT Time Calculation (min): 23 min  Charges: OT General Charges $OT Visit: 1 Visit OT Treatments $Self Care/Home Management : 8-22 mins  [place], OTR/L Acute Rehab Pager: [phone] Office: [place] 11/19/2018, 5:12 PM

## 2018-11-19 NOTE — Consult Note (Signed)
   [facility] CM Inpatient Consult   11/19/2018  Reginald [name] Nov 02, 1935 [id number]   Patient screened for high risk score for unplanned readmissions and hospitalizations to check if potential [place] Management services are needed.  Chart review was briefed and noted that this patient is a long term resident at [facility] and the disposition is for the patient to return per inpatient TOC LCSW notes. MD notes reveals patient is admitted for Acute respiratory failure with hypoxia secondary to COVID-19.  No [facility] Care Management needs noted at this time as patient is a resident at the facility.  Please place a [facility] Care Management consult as appropriate, if disposition change and care management needs are assessed and for questions contact:   Reginald Brood, RN BSN [facility] Liaison  [phone] business mobile phone Toll free office [phone]  Fax number: [phone] [name]  [email or web address]

## 2018-11-19 NOTE — Progress Notes (Signed)
Speech-Language Pathology Note:  SLP spoke with pt and RN at bedside. They both voice no concerns regarding swallowing with current diet in place. Chart reviewed with stable lung sounds, no fevers, suggestive of adequate tolerance. Will continue to follow along, providing direct visualization as needed.   Pollyann Glen, M.A. [facility] Acute Environmental education officer [phone] Office [phone]

## 2018-11-19 NOTE — Progress Notes (Signed)
Physical Therapy Treatment Patient Details Name: Reginald [name] MRN: [id number] DOB: Feb 21, 1936 Today's Date: 11/19/2018    History of Present Illness Pt adm with SOB and fever. Pt with PNA and COVID 19 positiive. PMH - morbid obesity, ckd, dm, chronic venous stasis, copd, chf, MI, afib, lumbar fusion    PT Comments    The ppatient is improving in mobility. Ambu;lated x 60' on RA with saturation 90%. RN aware and agreed to leave the oxygen off. Pt. On monitor. Patient reports that he was ambulating 1000' at [facility]. Continue PT for increased ambulation.   Follow Up Recommendations  SNF     Equipment Recommendations  None recommended by PT    Recommendations for Other Services       Precautions / Restrictions Precautions Precautions: Fall Precaution Comments: monitor O2 sats, states legs give out    Mobility  Bed Mobility               General bed mobility comments: oob  Transfers Overall transfer level: Needs assistance Equipment used: Rolling walker (2 wheeled) Transfers: Sit to/from Stand Sit to Stand: Min assist         General transfer comment: Assist to bring hips up and for balance. Verbal cues for hand placementfrom recliner x2  Ambulation/Gait Ambulation/Gait assistance: Min assist;+2 safety/equipment Gait Distance (Feet): 15 Feet(then 60') Assistive device: Rolling walker (2 wheeled) Gait Pattern/deviations: Step-through pattern Gait velocity: decr   General Gait Details: recliner followed for safety   Stairs             Wheelchair Mobility    Modified Rankin (Stroke Patients Only)       Balance                                            Cognition Arousal/Alertness: Awake/alert Behavior During Therapy: WFL for tasks assessed/performed Overall Cognitive Status: Within Functional Limits for tasks assessed                                 General Comments: pt HOH requiring increased time and often  repetition of instruction, alert and oriented basics      Exercises      General Comments        Pertinent Vitals/Pain Pain Assessment: No/denies pain    Home Living                      Prior Function            PT Goals (current goals can now be found in the care plan section) Progress towards PT goals: Progressing toward goals    Frequency    Min 4X/week      PT Plan Current plan remains appropriate;Frequency needs to be updated    Co-evaluation   Reason for Co-Treatment: For patient/therapist safety          AM-PAC PT "6 Clicks" Mobility   Outcome Measure  Help needed turning from your back to your side while in a flat bed without using bedrails?: A Lot Help needed moving from lying on your back to sitting on the side of a flat bed without using bedrails?: A Lot Help needed moving to and from a bed to a chair (including a wheelchair)?: A Little Help needed standing up from a  chair using your arms (e.g., wheelchair or bedside chair)?: A Little Help needed to walk in hospital room?: A Little Help needed climbing 3-5 steps with a railing? : A Lot 6 Click Score: 15    End of Session Equipment Utilized During Treatment: Gait belt Activity Tolerance: Patient tolerated treatment well Patient left: in chair;with call bell/phone within reach;with chair alarm set Nurse Communication: Mobility status PT Visit Diagnosis: Other abnormalities of gait and mobility (R26.89);Muscle weakness (generalized) (M62.81)     Time: 1224-8250 PT Time Calculation (min) (ACUTE ONLY): 23 min  Charges:  $Gait Training: 8-22 mins                     [place] Pager [phone] Office [phone]    Claretha Cooper 11/19/2018, 4:52 PM

## 2018-11-20 DIAGNOSIS — N179 Acute kidney failure, unspecified: Secondary | ICD-10-CM

## 2018-11-20 DIAGNOSIS — I5032 Chronic diastolic (congestive) heart failure: Secondary | ICD-10-CM

## 2018-11-20 LAB — CBC WITH DIFFERENTIAL/PLATELET
Abs Immature Granulocytes: 0.16 10*3/uL — ABNORMAL HIGH (ref 0.00–0.07)
Basophils Absolute: 0 10*3/uL (ref 0.0–0.1)
Basophils Relative: 0 %
Eosinophils Absolute: 0 10*3/uL (ref 0.0–0.5)
Eosinophils Relative: 0 %
HCT: 39.7 % (ref 39.0–52.0)
Hemoglobin: 12.2 g/dL — ABNORMAL LOW (ref 13.0–17.0)
Immature Granulocytes: 1 %
Lymphocytes Relative: 4 %
Lymphs Abs: 0.4 10*3/uL — ABNORMAL LOW (ref 0.7–4.0)
MCH: 26.3 pg (ref 26.0–34.0)
MCHC: 30.7 g/dL (ref 30.0–36.0)
MCV: 85.6 fL (ref 80.0–100.0)
Monocytes Absolute: 0.4 10*3/uL (ref 0.1–1.0)
Monocytes Relative: 4 %
Neutro Abs: 10.6 10*3/uL — ABNORMAL HIGH (ref 1.7–7.7)
Neutrophils Relative %: 91 %
Platelets: 249 10*3/uL (ref 150–400)
RBC: 4.64 MIL/uL (ref 4.22–5.81)
RDW: 16.2 % — ABNORMAL HIGH (ref 11.5–15.5)
WBC: 11.6 10*3/uL — ABNORMAL HIGH (ref 4.0–10.5)
nRBC: 0 % (ref 0.0–0.2)

## 2018-11-20 LAB — MAGNESIUM: Magnesium: 2.3 mg/dL (ref 1.7–2.4)

## 2018-11-20 LAB — CULTURE, BLOOD (ROUTINE X 2)
Culture: NO GROWTH
Culture: NO GROWTH
Special Requests: ADEQUATE
Special Requests: ADEQUATE

## 2018-11-20 LAB — COMPREHENSIVE METABOLIC PANEL
ALT: 92 U/L — ABNORMAL HIGH (ref 0–44)
AST: 79 U/L — ABNORMAL HIGH (ref 15–41)
Albumin: 2.9 g/dL — ABNORMAL LOW (ref 3.5–5.0)
Alkaline Phosphatase: 54 U/L (ref 38–126)
Anion gap: 11 (ref 5–15)
BUN: 70 mg/dL — ABNORMAL HIGH (ref 8–23)
CO2: 25 mmol/L (ref 22–32)
Calcium: 8.7 mg/dL — ABNORMAL LOW (ref 8.9–10.3)
Chloride: 101 mmol/L (ref 98–111)
Creatinine, Ser: 2.12 mg/dL — ABNORMAL HIGH (ref 0.61–1.24)
GFR calc Af Amer: 33 mL/min — ABNORMAL LOW (ref >60)
GFR calc non Af Amer: 29 mL/min — ABNORMAL LOW (ref >60)
Glucose, Bld: 344 mg/dL — ABNORMAL HIGH (ref 70–99)
Potassium: 4.1 mmol/L (ref 3.5–5.1)
Sodium: 137 mmol/L (ref 135–145)
Total Bilirubin: 0.7 mg/dL (ref 0.3–1.2)
Total Protein: 6.1 g/dL — ABNORMAL LOW (ref 6.5–8.1)

## 2018-11-20 LAB — GLUCOSE, CAPILLARY
Glucose-Capillary: 359 mg/dL — ABNORMAL HIGH (ref 70–99)
Glucose-Capillary: 328 mg/dL — ABNORMAL HIGH (ref 70–99)
Glucose-Capillary: 315 mg/dL — ABNORMAL HIGH (ref 70–99)
Glucose-Capillary: 346 mg/dL — ABNORMAL HIGH (ref 70–99)
Glucose-Capillary: 362 mg/dL — ABNORMAL HIGH (ref 70–99)
Glucose-Capillary: 396 mg/dL — ABNORMAL HIGH (ref 70–99)

## 2018-11-20 LAB — QUANTIFERON-TB GOLD PLUS (RQFGPL)
QuantiFERON Mitogen Value: 0.38 IU/mL
QuantiFERON Nil Value: 0.02 IU/mL
QuantiFERON TB1 Ag Value: 0.02 IU/mL
QuantiFERON TB2 Ag Value: 0.02 IU/mL

## 2018-11-20 LAB — PHOSPHORUS: Phosphorus: 3.2 mg/dL (ref 2.5–4.6)

## 2018-11-20 LAB — QUANTIFERON-TB GOLD PLUS: QuantiFERON-TB Gold Plus: UNDETERMINED — AB

## 2018-11-20 LAB — C-REACTIVE PROTEIN: CRP: 0.8 mg/dL (ref <1.0)

## 2018-11-20 LAB — FERRITIN: Ferritin: 361 ng/mL — ABNORMAL HIGH (ref 24–336)

## 2018-11-20 LAB — TRIGLYCERIDES: Triglycerides: 183 mg/dL — ABNORMAL HIGH (ref <150)

## 2018-11-20 MED ORDER — PREDNISONE 20 MG PO TABS
40.0000 mg | ORAL_TABLET | Freq: Every day | ORAL | Status: DC
  Administered 2018-11-21: 09:00:00 40 mg via ORAL
  Filled 2018-11-20 (×2): qty 2

## 2018-11-20 NOTE — Progress Notes (Signed)
Occupational Therapy Treatment Patient Details Name: Reginald [name] MRN: [id number] DOB: 1936/06/01 Today's Date: 11/20/2018    History of present illness Pt adm with SOB and fever. Pt with PNA and COVID 19 positiive. PMH - morbid obesity, ckd, dm, chronic venous stasis, copd, chf, MI, afib, lumbar fusion   OT comments  Challenging pt's activity tolerance and standing tolerance to shave his face at the sink; performing an ADLs requiring increased time and effort. Pt highly motivated to participate in this activity. Pt washing his face and placing shaving cream on before requiring to sit due to fatigue and weakness. Pt requiring Min A for shaving certain areas. Continue to recommend dc to SNF and will continue to follow acutely as admitted.    Follow Up Recommendations  SNF;Supervision/Assistance - 24 hour    Equipment Recommendations  Other (comment)(TBA)    Recommendations for Other Services      Precautions / Restrictions Precautions Precautions: Fall Precaution Comments: monitor O2 sats, states legs give out Restrictions Weight Bearing Restrictions: No       Mobility Bed Mobility Overal bed mobility: Needs Assistance Bed Mobility: Sit to Supine Rolling: Supervision Sidelying to sit: Min assist   Sit to supine: Min guard   General bed mobility comments: OOB  Transfers Overall transfer level: Needs assistance Equipment used: Rolling walker (2 wheeled) Transfers: Sit to/from Stand Sit to Stand: Min assist;From elevated surface;Mod assist         General transfer comment: Pt requiring Min A for first sit<>Stands. Requiring increased assistance for powering up into standing as session progressed    Balance Overall balance assessment: Needs assistance Sitting-balance support: No upper extremity supported;Feet supported Sitting balance-Leahy Scale: Fair     Standing balance support: Single extremity supported;During functional activity Standing balance-Leahy  Scale: Poor Standing balance comment: Performing peri care with single hand on walker                           ADL either performed or assessed with clinical judgement   ADL Overall ADL's : Needs assistance/impaired     Grooming: Min guard;Standing;Minimal assistance;Sitting(Shaving) Grooming Details (indicate cue type and reason): Pt participating in shaving his face to challenge his activity tolerance. Pt able to initate task and wash his face and place shaving cream on before needing to sit due to fatigue and weakness. Pt sitting to complete shaving and required Min A to assist with certain spots on his face. Pt requiring several rest breaks. 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 Details (indicate cue type and reason): Pt performing toileting with need for Min A to power up into standing         Functional mobility during ADLs: Minimal assistance;Rolling walker;Min guard General ADL Comments: Pt demonstrating increased dyspnea this session and SpO2 >87% on RA     Vision       Perception     Praxis      Cognition Arousal/Alertness: Awake/alert Behavior During Therapy: WFL for tasks assessed/performed Overall Cognitive Status: Within Functional Limits for tasks assessed                                 General Comments: pt HOH requiring increased time and often repetition of instruction, alert  and oriented basics        Exercises     Shoulder Instructions       General Comments Spo2 >88% on RA    Pertinent Vitals/ Pain       Pain Assessment: No/denies pain  Home Living                                          Prior Functioning/Environment              Frequency  Min 2X/week        Progress Toward Goals  OT Goals(current goals can now be found in the care plan section)  Progress  towards OT goals: Progressing toward goals  Acute Rehab OT Goals Patient Stated Goal: Get up out of bed OT Goal Formulation: With patient Time For Goal Achievement: 11/30/18 Potential to Achieve Goals: Good ADL Goals Pt Will Perform Grooming: with supervision;standing Pt Will Perform Lower Body Bathing: with min guard assist;sit to/from stand Pt Will Perform Upper Body Dressing: with set-up;sitting Pt Will Perform Lower Body Dressing: with min guard assist;sit to/from stand Pt Will Transfer to Toilet: with supervision;ambulating Pt Will Perform Toileting - Clothing Manipulation and hygiene: with supervision;sit to/from stand Pt/caregiver will Perform Home Exercise Program: Increased ROM;Increased strength;Both right and left upper extremity;With Supervision  Plan Discharge plan remains appropriate    Co-evaluation    PT/OT/SLP Co-Evaluation/Treatment: Yes Reason for Co-Treatment: To address functional/ADL transfers PT goals addressed during session: Mobility/safety with mobility OT goals addressed during session: ADL's and self-care      AM-PAC OT "6 Clicks" Daily Activity     Outcome Measure   Help from another person eating meals?: None Help from another person taking care of personal grooming?: A Little Help from another person toileting, which includes using toliet, bedpan, or urinal?: A Lot Help from another person bathing (including washing, rinsing, drying)?: A Lot Help from another person to put on and taking off regular upper body clothing?: A Little Help from another person to put on and taking off regular lower body clothing?: A Lot 6 Click Score: 16    End of Session Equipment Utilized During Treatment: Rolling walker  OT Visit Diagnosis: Muscle weakness (generalized) (M62.81);Unsteadiness on feet (R26.81)   Activity Tolerance Patient tolerated treatment well   Patient Left in bed;with call bell/phone within reach;with bed alarm set   Nurse Communication  Mobility status        Time: 2947-6546 OT Time Calculation (min): 44 min  Charges: OT General Charges $OT Visit: 1 Visit OT Treatments $Self Care/Home Management : 23-37 mins  [place], OTR/L Acute Rehab Pager: [phone] Office: [place] 11/20/2018, 5:52 PM

## 2018-11-20 NOTE — Progress Notes (Signed)
Physical Therapy Treatment Patient Details Name: Reginald [name] MRN: [id number] DOB: [birth year] Today's Date: 11/20/2018    History of Present Illness Pt adm with SOB and fever. Pt with PNA and COVID 19 positiive. PMH - morbid obesity, ckd, dm, chronic venous stasis, copd, chf, MI, afib, lumbar fusion    PT Comments    The patient  Assisted self to sitting with min assist of trunk. Ambulated x 15' x 2. Patient's tray set up  for breakfast. Will   Ambulate later today.   Follow Up Recommendations  SNF     Equipment Recommendations  None recommended by PT    Recommendations for Other Services       Precautions / Restrictions Precautions Precautions: Fall Precaution Comments: monitor O2 sats, states legs give out Restrictions Weight Bearing Restrictions: No    Mobility  Bed Mobility   Bed Mobility: Rolling;Sidelying to Sit Rolling: Supervision Sidelying to sit: Min assist       General bed mobility comments: slighht assist with trunkl  Transfers Overall transfer level: Needs assistance Equipment used: Rolling walker (2 wheeled) Transfers: Sit to/from Stand Sit to Stand: Min assist;From elevated surface         General transfer comment: Pt requiring Min A for first sit<>Stands. Requiring increased assistance for powering up into standing as session progressed  Ambulation/Gait Ambulation/Gait assistance: Min assist;+2 safety/equipment Gait Distance (Feet): 15 Feet(x 2) Assistive device: Rolling walker (2 wheeled) Gait Pattern/deviations: Step-through pattern     General Gait Details: gait steady with RW. On RA   Stairs             Wheelchair Mobility    Modified Rankin (Stroke Patients Only)       Balance Overall balance assessment: Needs assistance Sitting-balance support: No upper extremity supported;Feet supported Sitting balance-Leahy Scale: Fair     Standing balance support: Single extremity supported;During functional  activity Standing balance-Leahy Scale: Poor Standing balance comment: Performing peri care with single hand on walker                            Cognition Arousal/Alertness: Awake/alert Behavior During Therapy: WFL for tasks assessed/performed Overall Cognitive Status: Within Functional Limits for tasks assessed                                 General Comments: pt HOH requiring increased time and often repetition of instruction, alert and oriented basics      Exercises      General Comments General comments (skin integrity, edema, etc.): SpO2 >87% on RA      Pertinent Vitals/Pain Pain Assessment: No/denies pain    Home Living                      Prior Function            PT Goals (current goals can now be found in the care plan section) Acute Rehab PT Goals Patient Stated Goal: Get up out of bed Progress towards PT goals: Progressing toward goals    Frequency    Min 4X/week      PT Plan Current plan remains appropriate    Co-evaluation   Reason for Co-Treatment: To address functional/ADL transfers   OT goals addressed during session: ADL's and self-care      AM-PAC PT "6 Clicks" Mobility   Outcome Measure  Help needed turning  from your back to your side while in a flat bed without using bedrails?: A Little Help needed moving from lying on your back to sitting on the side of a flat bed without using bedrails?: A Little Help needed moving to and from a bed to a chair (including a wheelchair)?: A Little Help needed standing up from a chair using your arms (e.g., wheelchair or bedside chair)?: A Little Help needed to walk in hospital room?: A Little Help needed climbing 3-5 steps with a railing? : A Lot 6 Click Score: 17    End of Session Equipment Utilized During Treatment: Gait belt Activity Tolerance: Patient tolerated treatment well Patient left: in chair;with chair alarm set;with call bell/phone within reach Nurse  Communication: Mobility status PT Visit Diagnosis: Other abnormalities of gait and mobility (R26.89);Muscle weakness (generalized) (M62.81)     Time: 0211-1735 PT Time Calculation (min) (ACUTE ONLY): 42 min  Charges:  $Gait Training: 23-37 mins $Self Care/Home Management: [place] Pager [phone] Office [phone]    Claretha Cooper 11/20/2018, 5:43 PM

## 2018-11-20 NOTE — Progress Notes (Signed)
Physical Therapy Treatment Patient Details Name: MERLEN GURRY MRN: [id number] DOB: 1935/12/03 Today's Date: 11/20/2018    History of Present Illness Pt adm with SOB and fever. Pt with PNA and COVID 19 positiive. PMH - morbid obesity, ckd, dm, chronic venous stasis, copd, chf, MI, afib, lumbar fusion    PT Comments    The patient ambulated x 70' on RA. Dyspnea 3/4, saturation 98%-87%. Patient slightly more  SOB this visit. Continue to progress ambulation/mobility.  Follow Up Recommendations  SNF     Equipment Recommendations  None recommended by PT    Recommendations for Other Services       Precautions / Restrictions Precautions Precautions: Fall Precaution Comments: monitor O2 sats, states legs give out Restrictions Weight Bearing Restrictions: No    Mobility  Bed Mobility Overal bed mobility: Needs Assistance Bed Mobility: Sit to Supine Rolling: Supervision Sidelying to sit: Min assist   Sit to supine: Min guard   General bed mobility comments: OOB  Transfers Overall transfer level: Needs assistance Equipment used: Rolling walker (2 wheeled) Transfers: Sit to/from Stand Sit to Stand: Min assist;From elevated surface;Mod assist         General transfer comment: Pt requiring Min A for first sit<>Stands. Requiring increased assistance for powering up into standing as session progressed  Ambulation/Gait Ambulation/Gait assistance: Min assist;+2 safety/equipment Gait Distance (Feet): 70 Feet Assistive device: Rolling walker (2 wheeled) Gait Pattern/deviations: Step-through pattern Gait velocity: decr   General Gait Details: gait less steady at times, tending to lean backwards,. With increased distance, patient improved. Dyspnea 3/4 on RA, sats 98-87%   Stairs             Wheelchair Mobility    Modified Rankin (Stroke Patients Only)       Balance Overall balance assessment: Needs assistance Sitting-balance support: No upper extremity  supported;Feet supported Sitting balance-Leahy Scale: Fair     Standing balance support: Single extremity supported;During functional activity Standing balance-Leahy Scale: Poor Standing balance comment: Performing peri care with single hand on walker                            Cognition Arousal/Alertness: Awake/alert Behavior During Therapy: WFL for tasks assessed/performed Overall Cognitive Status: Within Functional Limits for tasks assessed                                 General Comments: pt HOH requiring increased time and often repetition of instruction, alert and oriented basics      Exercises      General Comments General comments (skin integrity, edema, etc.): Spo2 >88% on RA      Pertinent Vitals/Pain Pain Assessment: No/denies pain    Home Living                      Prior Function            PT Goals (current goals can now be found in the care plan section) Acute Rehab PT Goals Patient Stated Goal: Get up out of bed Progress towards PT goals: Progressing toward goals    Frequency    Min 4X/week      PT Plan Current plan remains appropriate    Co-evaluation PT/OT/SLP Co-Evaluation/Treatment: Yes Reason for Co-Treatment: To address functional/ADL transfers PT goals addressed during session: Mobility/safety with mobility OT goals addressed during session: ADL's and self-care  AM-PAC PT "6 Clicks" Mobility   Outcome Measure  Help needed turning from your back to your side while in a flat bed without using bedrails?: A Little Help needed moving from lying on your back to sitting on the side of a flat bed without using bedrails?: A Little Help needed moving to and from a bed to a chair (including a wheelchair)?: A Lot Help needed standing up from a chair using your arms (e.g., wheelchair or bedside chair)?: A Lot Help needed to walk in hospital room?: A Little Help needed climbing 3-5 steps with a railing? :  A Lot 6 Click Score: 15    End of Session Equipment Utilized During Treatment: Gait belt Activity Tolerance: Patient tolerated treatment well Patient left: in chair;with call bell/phone within reach(w/OT) Nurse Communication: Mobility status PT Visit Diagnosis: Other abnormalities of gait and mobility (R26.89);Muscle weakness (generalized) (M62.81)     Time: 9937-1696 PT Time Calculation (min) (ACUTE ONLY): 26 min  Charges:  $Gait Training: 8-22 mins                        Claretha Cooper 11/20/2018, 5:52 PM

## 2018-11-20 NOTE — Progress Notes (Signed)
PROGRESS NOTE  Reginald [name] ZOX:[id number] DOB: 1936/03/14 DOA: 11/15/2018  PCP: Guadlupe Spanish, MD  Brief History/Interval Summary: 83 y.o. male with medical history significant for atrial fibrillation, type 2 diabetes mellitus, essential hypertension, HLD, morbid obesity, chronic venous stasis with chronic ulcers who presented to [facility] long ED from SNF with reported shortness of breath and fever.  Per EMS report, noted to be saturating in the mid 80s, initially requiring nonrebreather.  Patient was noted to be febrile.  Chest x-ray showed bibasilar opacities.  Patient was hospitalized for further management.    Reason for Visit: Acute respiratory failure with hypoxia secondary to COVID-19  Consultants: None  Procedures: None  Antibiotics: Anti-infectives (From admission, onward)   Start     Dose/Rate Route Frequency Ordered Stop   11/17/18 1200  vancomycin (VANCOCIN) 1,250 mg in sodium chloride 0.9 % 250 mL IVPB  Status:  Discontinued     1,250 mg 166.7 mL/hr over 90 Minutes Intravenous Every 24 hours 11/17/18 1120 11/18/18 1116   11/17/18 1130  ceFEPIme (MAXIPIME) 2 g in sodium chloride 0.9 % 100 mL IVPB     2 g 200 mL/hr over 30 Minutes Intravenous Every 12 hours 11/17/18 1120 11/22/18 0959   11/17/18 1000  hydroxychloroquine (PLAQUENIL) tablet 200 mg  Status:  Discontinued     200 mg Oral 2 times daily 11/16/18 0420 11/16/18 1307   11/16/18 1000  hydroxychloroquine (PLAQUENIL) tablet 400 mg  Status:  Discontinued     400 mg Oral 2 times daily 11/16/18 0420 11/16/18 1307   11/15/18 2345  vancomycin (VANCOCIN) 1,500 mg in sodium chloride 0.9 % 500 mL IVPB     1,500 mg 250 mL/hr over 120 Minutes Intravenous  Once 11/15/18 2335 11/16/18 0205   11/15/18 1915  ceFEPIme (MAXIPIME) 2 g in sodium chloride 0.9 % 100 mL IVPB     2 g 200 mL/hr over 30 Minutes Intravenous  Once 11/15/18 1901 11/15/18 2035   11/15/18 1900  vancomycin (VANCOCIN) IVPB 1000 mg/200 mL premix     1,000  mg 200 mL/hr over 60 Minutes Intravenous  Once 11/15/18 1855 11/15/18 2240       Subjective/Interval History: Patient denies any complaints this morning.  Denies any shortness of breath.  No nausea or vomiting.     Assessment/Plan:  Acute Hypoxic Resp. Failure due to Acute Covid 19 Viral Illness during the ongoing 2020 Covid 19 Pandemic  COVID-19 Labs  Recent Labs    11/18/18 0400 11/19/18 0500 11/19/18 0505 11/20/18 0437  DDIMER 1.49* 0.96*  --   --   FERRITIN 359*  --  456* 361*  CRP 6.2*  --  1.8* 0.8    Lab Results  Component Value Date   SARSCOV2NAA POSITIVE (A) 11/15/2018     Last episode of fever: Temperature 102.8 on 5/8.  Afebrile since then. Oxygen requirements: Yesterday patient was weaned down to room air with that she was saturating in the 90s.  Overnight he got placed on 2 L of oxygen by nasal cannula.  Weaning attempt to continue today.     Antibiotics: Patient's hydroxychloroquine was discontinued on 5/8.  He was also placed on vancomycin and cefepime.  Based on negative cultures vancomycin was discontinued.  Cefepime for 5 days.  MRSA PCR was noted to be positive.  Steroids: Changed to oral steroids Diuretics: Home dose of torsemide was being continued.  However this was held yesterday due to rising creatinine. Actemra: 2 doses given, 1 on 5/8 and  the other on 5/9. QuantiFERON test: Pending Convalescent Plasma: No clear indication to be considered for use of convalescent plasma. Vitamin C and zinc: Continue   Prone positioning: Patient encouraged to stay in prone position as much as possible.  However he has limited mobility at baseline and he is obese and points it very hard to be in the prone position.  Still this was encouraged.  Per nursing staff the most he is able to do is to lay on his side.  PT and OT continues to work with him.  Mobilize as much as possible.  Foley catheter was discontinued yesterday.  He continues to urinate without difficulty.  Patient does not appear to be functional at baseline.  He usually gets around in a wheelchair.  Inflammatory markers and d-dimer as above.  CRP is improved to 0.8.  Ferritin 361.  D-dimer was 0.96 when last checked.    Patient was placed on vancomycin and cefepime however he did not get a dose on 5/8.  Blood cultures are negative.  MRSA PCR is positive.  Vancomycin was discontinued.  Continue cefepime for a total of 5 days.    The treatment plan and use of medications and known side effects were discussed with patient/family. It was clearly explained that there is no proven definitive treatment for COVID-19 infection yet. Any medications used here are based on case reports/anecdotal data which are not peer-reviewed and has not been studied using randomized control trials.  Complete risks and long-term side effects are unknown, however in the best clinical judgment they seem to be of some clinical benefit rather than medical risks.  Patient/family agree with the treatment plan and want to receive these treatments. Actemra was given.  History of persistent atrial fibrillation Continue metoprolol for rate control.  Patient is chronically anticoagulated with Xarelto.  Heart rate is stable.  Acute on chronic kidney disease stage IV Baseline creatinine not known but last creatinine in our system from 2018 was 1.27.  Creatinine has been stable in the 1.8-1.9 range.  Yesterday creatinine went up to 2.2 with a rise in BUN as well.  Patient's torsemide was stopped.  BUN noted to be higher today.  Creatinine 2.12.  Continue to monitor labs.  Continue to hold diuretics.  Potassium is 4.1.    Essential hypertension Continue home medications. blood pressure is reasonably well controlled.  Continue to monitor.  Chronic diastolic CHF Followed by cardiology, Dr. Zella Richer, as an outpatient.  Last echocardiogram from 2018 showed normal systolic function at 55 to 60%.  Continue beta-blocker.  Patient is on torsemide at  home.  This was held due to elevated creatinine.  Diabetes mellitus type 2 HbA1c 6.9 in 2018.  Hyperglycemia due to steroids.  Should improve as steroid is tapered down.  Dose of Lantus was increased.  We will cut down steroids today which should help.  History of peripheral neuropathy Continue gabapentin  History of glaucoma Continue eyedrops  History of hyperlipidemia Continue home medications.  History of depression Continue Lexapro  Morbid obesity BMI 48.08.   DVT Prophylaxis: On Xarelto  Lab Results  Component Value Date   PLT 249 11/20/2018     PUD Prophylaxis: Pepcid Code Status: Full code Family Communication: Discussed with patient and son on a daily basis. Disposition Plan: Management as outlined above.  Patient is here from a skilled nursing facility.  Will consult social worker as patient appears to be getting close to discharge.   Medications:  Scheduled: . amLODipine  10 mg Oral Daily  . atorvastatin  20 mg Oral QHS  . Chlorhexidine Gluconate Cloth  6 each Topical Q0600  . escitalopram  5 mg Oral Daily  . famotidine  20 mg Oral Daily  . gabapentin  100 mg Oral TID  . insulin aspart  0-20 Units Subcutaneous Q4H  . insulin glargine  15 Units Subcutaneous BID  . latanoprost  1 drop Both Eyes QHS  . loratadine  10 mg Oral Daily  . magnesium oxide  400 mg Oral Daily  . mouth rinse  15 mL Mouth Rinse BID  . Melatonin  5 mg Oral QHS  . methylPREDNISolone (SOLU-MEDROL) injection  40 mg Intravenous Q12H  . metoprolol tartrate  25 mg Oral Daily  . mupirocin ointment  1 application Nasal BID  . potassium chloride SA  20 mEq Oral Daily  . Rivaroxaban  15 mg Oral Q breakfast  . vitamin C  500 mg Oral Daily  . zinc sulfate  220 mg Oral Daily   Continuous: . sodium chloride    . ceFEPime (MAXIPIME) IV 2 g (11/20/18 1014)   MWN:UUVOZD chloride, acetaminophen, bisacodyl, chlorpheniramine-HYDROcodone, guaiFENesin-dextromethorphan, HYDROcodone-acetaminophen,  ipratropium, magnesium citrate, ondansetron **OR** ondansetron (ZOFRAN) IV, polyvinyl alcohol, senna-docusate   Objective:  Vital Signs  Vitals:   11/19/18 2134 11/20/18 0500 11/20/18 0753 11/20/18 1217  BP: (!) 107/54  (!) 157/80 135/62  Pulse: 60  (!) 57 (!) 56  Resp:   (!) 24 17  Temp: (!) 97.5 F (36.4 C)  (!) 97.4 F (36.3 C) (!) 97.2 F (36.2 C)  TempSrc: Oral  Oral Axillary  SpO2:   96% 96%  Weight:  (!) 152.7 kg    Height:        Intake/Output Summary (Last 24 hours) at 11/20/2018 1306 Last data filed at 11/20/2018 0754 Gross per 24 hour  Intake 1187.38 ml  Output 2110 ml  Net -922.62 ml   Filed Weights   11/15/18 1808 11/18/18 0517 11/20/18 0500  Weight: (!) 152 kg (!) 146.1 kg (!) 152.7 kg   General appearance: Awake alert.  In no distress.  Morbidly obese Resp: Normal effort at rest.  Coarse breath sounds bilaterally.  No wheezing rales or rhonchi.   Cardio: S1-S2 is irregularly irregular.  No S3-S4.  No rubs murmurs or bruit GI: Abdomen is soft.  Nontender nondistended.  Bowel sounds are present normal.  No masses organomegaly Extremities: No edema.  Full range of motion of lower extremities. Neurologic: Alert and oriented x3.  No focal neurological deficits.    Lab Results:  Data Reviewed: I have personally reviewed following labs and imaging studies  CBC: Recent Labs  Lab 11/16/18 0745 11/17/18 0405 11/18/18 0400 11/19/18 0500 11/20/18 0437  WBC 6.3 3.8* 2.9* 8.4 11.6*  NEUTROABS 4.6 2.7 2.5 7.5 10.6*  HGB 13.0 12.3* 12.3* 12.0* 12.2*  HCT 42.2 40.8 39.9 38.8* 39.7  MCV 86.1 86.6 85.8 87.0 85.6  PLT 179 201 204 209 664    Basic Metabolic Panel: Recent Labs  Lab 11/16/18 0745 11/17/18 0405 11/18/18 0400 11/19/18 0500 11/20/18 0437  NA 139 141 138 137 137  K 3.2* 3.0* 4.3 4.3 4.1  CL 102 100 101 100 101  CO2 26 29 25 27 25   GLUCOSE 164* 114* 322* 380* 344*  BUN 30* 31* 41* 56* 70*  CREATININE 1.73* 1.82* 1.83* 2.20* 2.12*  CALCIUM  7.9* 8.0* 8.2* 8.5* 8.7*  MG 1.9 2.0 2.2 2.3 2.3  PHOS 2.8 3.3 2.8 2.9  3.2    GFR: Estimated Creatinine Clearance: 41.2 mL/min (A) (by C-G formula based on SCr of 2.12 mg/dL (H)).  Liver Function Tests: Recent Labs  Lab 11/16/18 0745 11/17/18 0405 11/18/18 0400 11/19/18 0500 11/20/18 0437  AST 52* 54* 49* 34 79*  ALT 29 31 34 33 92*  ALKPHOS 58 55 57 55 54  BILITOT 0.7 0.8 0.8 0.5 0.7  PROT 6.1* 6.0* 6.3* 6.0* 6.1*  ALBUMIN 3.0* 2.9* 2.9* 2.7* 2.9*     CBG: Recent Labs  Lab 11/19/18 2035 11/20/18 0130 11/20/18 0458 11/20/18 0748 11/20/18 1220  GLUCAP 457* 359* 328* 315* 346*    Lipid Profile: Recent Labs    11/19/18 0500 11/20/18 0437  TRIG 144 183*    Anemia Panel: Recent Labs    11/19/18 0505 11/20/18 0437  FERRITIN 456* 361*    Recent Results (from the past 240 hour(s))  Blood Culture (routine x 2)     Status: None   Collection Time: 11/15/18  6:15 PM  Result Value Ref Range Status   Specimen Description   Final    BLOOD RIGHT ARM Performed at [facility], [place] [address] [place], [place] [address]    Special Requests   Final    BOTTLES DRAWN AEROBIC AND ANAEROBIC Blood Culture adequate volume Performed at [place] [address] [place], [place] [address]    Culture   Final    NO GROWTH 5 DAYS Performed at [facility], [place] [address] [place], [place] [address]    Report Status 11/20/2018 FINAL  Final  Blood Culture (routine x 2)     Status: None   Collection Time: 11/15/18  6:20 PM  Result Value Ref Range Status   Specimen Description   Final    BLOOD LEFT ARM Performed at [facility], [place] [address], [place], [place] [address]    Special Requests   Final    BOTTLES DRAWN AEROBIC AND ANAEROBIC Blood Culture adequate volume Performed at [place] [address], [place], [place] [address]    Culture   Final    NO GROWTH 5 DAYS Performed at [facility],  [place] [address] [place], [place] [address]    Report Status 11/20/2018 FINAL  Final  SARS Coronavirus 2 [facility] order, Performed in [place] hospital lab)     Status: Abnormal   Collection Time: 11/15/18  7:00 PM  Result Value Ref Range Status   SARS Coronavirus 2 POSITIVE (A) NEGATIVE Final    Comment: RESULT CALLED TO, READ BACK BY AND VERIFIED WITH: Susa Day RN 2228 11/15/18 A NAVARRO (NOTE) If result is NEGATIVE SARS-CoV-2 target nucleic acids are NOT DETECTED. The SARS-CoV-2 RNA is generally detectable in upper and lower  respiratory specimens during the acute phase of infection. The lowest  concentration of SARS-CoV-2 viral copies this assay can detect is 250  copies / mL. A negative result does not preclude SARS-CoV-2 infection  and should not be used as the sole basis for treatment or other  patient management decisions.  A negative result may occur with  improper specimen collection / handling, submission of specimen other  than nasopharyngeal swab, presence of viral mutation(s) within the  areas targeted by this assay, and inadequate number of viral copies  (<250 copies / mL). A negative result must be combined with clinical  observations, patient history, and epidemiological information. If result is POSITIVE SARS-CoV-2 target nucleic acids are  DETECTED. T he SARS-CoV-2 RNA is generally detectable in upper and lower  respiratory specimens during the acute phase of infection.  Positive  results are indicative of active infection with SARS-CoV-2.  Clinical  correlation with patient history and other diagnostic information is  necessary to determine patient infection status.  Positive results do  not rule out bacterial infection or co-infection with other viruses. If result is PRESUMPTIVE POSTIVE SARS-CoV-2 nucleic acids MAY BE PRESENT.   A presumptive positive result was obtained on the submitted specimen  and confirmed on repeat testing.  While 2019 novel coronavirus   (SARS-CoV-2) nucleic acids may be present in the submitted sample  additional confirmatory testing may be necessary for epidemiological  and / or clinical management purposes  to differentiate between  SARS-CoV-2 and other Sarbecovirus currently known to infect humans.  If clinically indicated additional testing with an alternate test  methodology [phone]) is  advised. The SARS-CoV-2 RNA is generally  detectable in upper and lower respiratory specimens during the acute  phase of infection. The expected result is Negative. Fact Sheet for Patients:  StrictlyIdeas.no Fact Sheet for Healthcare Providers: BankingDealers.co.za This test is not yet approved or cleared by the [place] FDA and has been authorized for detection and/or diagnosis of SARS-CoV-2 by FDA under an Emergency Use Authorization (EUA).  This EUA will remain in effect (meaning this test can be used) for the duration of the COVID-19 declaration under Section 564(b)(1) of the Act, 21 U.S.C. section 360bbb-3(b)(1), unless the authorization is terminated or revoked sooner. Performed at [facility], [place] [address], [place], [place] [address]   Respiratory Panel by PCR     Status: None   Collection Time: 11/15/18  7:00 PM  Result Value Ref Range Status   Adenovirus NOT DETECTED NOT DETECTED Final   Coronavirus 229E NOT DETECTED NOT DETECTED Final    Comment: (NOTE) The Coronavirus on the Respiratory Panel, DOES NOT test for the novel  Coronavirus (2019 nCoV)    Coronavirus HKU1 NOT DETECTED NOT DETECTED Final   Coronavirus NL63 NOT DETECTED NOT DETECTED Final   Coronavirus OC43 NOT DETECTED NOT DETECTED Final   Metapneumovirus NOT DETECTED NOT DETECTED Final   Rhinovirus / Enterovirus NOT DETECTED NOT DETECTED Final   Influenza A NOT DETECTED NOT DETECTED Final   Influenza B NOT DETECTED NOT DETECTED Final   Parainfluenza Virus 1 NOT DETECTED NOT  DETECTED Final   Parainfluenza Virus 2 NOT DETECTED NOT DETECTED Final   Parainfluenza Virus 3 NOT DETECTED NOT DETECTED Final   Parainfluenza Virus 4 NOT DETECTED NOT DETECTED Final   Respiratory Syncytial Virus NOT DETECTED NOT DETECTED Final   Bordetella pertussis NOT DETECTED NOT DETECTED Final   Chlamydophila pneumoniae NOT DETECTED NOT DETECTED Final   Mycoplasma pneumoniae NOT DETECTED NOT DETECTED Final    Comment: Performed at [facility], [address] [place], [place] [address]  MRSA PCR Screening     Status: Abnormal   Collection Time: 11/16/18  5:00 AM  Result Value Ref Range Status   MRSA by PCR POSITIVE (A) NEGATIVE Final    Comment:        The GeneXpert MRSA Assay (FDA approved for NASAL specimens only), is one component of a comprehensive MRSA colonization surveillance program. It is not intended to diagnose MRSA infection nor to guide or monitor treatment for MRSA infections. RESULT CALLED TO, READ BACK BY AND VERIFIED WITH: JACKSON RN AT 1046 ON 11/16/2018 BY S.VANHOORNE Performed at  [facility], [place] [address] [place] [address], [place], [place] [address]       Radiology Studies: No results found.     LOS: 5 days   Angelamarie [employer]ak[employer] on [email or web address]  11/20/2018, 1:06 PM

## 2018-11-20 NOTE — Progress Notes (Signed)
Pharmacy Antibiotic Note  Reginald [name] is a 84 y.o. male admitted on 11/15/2018 with pneumonia.  Pharmacy dosing Cefepime. Currently on D#4 of abx. WBC 11.6. Hypothermic. SCr stable at 2.12. Remains culture negative. On 3L [place]   Plan:  Cefepime 2g IV q12h  Monitor CBC, renal fx and clinical progress    Height: 5\' 10"  (177.8 cm) Weight: (!) 336 lb 10.3 oz (152.7 kg) IBW/kg (Calculated) : 73  Temp (24hrs), Avg:97.4 F (36.3 C), Min:97 F (36.1 C), Max:97.6 F (36.4 C)  Recent Labs  Lab 11/15/18 1859  11/16/18 0200 11/16/18 0745 11/17/18 0405 11/18/18 0400 11/19/18 0500 11/20/18 0437  WBC  --    < >  --  6.3 3.8* 2.9* 8.4 11.6*  CREATININE  --    < >  --  1.73* 1.82* 1.83* 2.20* 2.12*  LATICACIDVEN 2.0*  --  1.5 1.2  --   --   --   --    < > = values in this interval not displayed.    Estimated Creatinine Clearance: 41.2 mL/min (A) (by C-G formula based on SCr of 2.12 mg/dL (H)).    Allergies  Allergen Reactions  . Adhesive [Tape] Rash    Antimicrobials this admission: 5/7 Vancomycin x1, resume 5/9 >> 5/10 5/7 Cefepime x1, resume 5/9 >>  5/8 HCQ >> 5/8 5/8 Vit C/ Zinc >>  5/8 Tocilizumab x1 5/9 Tocilizumab x1  Dose adjustments this admission:   Microbiology results: 5/7 SARS Coronavirus 2: positive 5/7 BCx: negF 5/7 Respiratory panel: none detected 5/8 MRSA PCR: positive 5/8 Influenza PCR: neg 5/8 Strep pneumo Ur Ag: neg    Thank you for allowing pharmacy to be a part of this patient's care.  Albertina Parr, PharmD., BCPS Clinical Pharmacist Clinical phone for 11/20/18 until 5pm: [phone]

## 2018-11-20 NOTE — Progress Notes (Addendum)
Inpatient Diabetes Program Recommendations  AACE/ADA: New Consensus Statement on Inpatient Glycemic Control (2015)  Target Ranges:  Prepandial:   less than 140 mg/dL      Peak postprandial:   less than 180 mg/dL (1-2 hours)      Critically ill patients:  140 - 180 mg/dL   Results for AMADO, ANDAL (MRN [id number]) as of 11/20/2018 09:24  Ref. Range 11/19/2018 03:57 11/19/2018 07:49 11/19/2018 12:14 11/19/2018 16:19 11/19/2018 20:35 11/20/2018 01:30 11/20/2018 04:58 11/20/2018 07:48  Glucose-Capillary Latest Ref Range: 70 - 99 mg/dL 373 (H) Novolog 20 units given 294 (H) Novolog 11 units given 347 (H) Novolog 15 units given 372 (H) Novolog 20 units given 457 (H) Novolog 20 units given + Novolog 10 units given 359 (H) Novolog 20 units given 328 (H) Novolog 15 units given 315 (H) Novolog 15 units given   Review of Glycemic Control  Diabetes history: DM 2 Outpatient Diabetes medications: Trulicity 0.5 mg weekly, Glucotrol 5 mg daily, Tradjenta 5 mg daily Current orders for Inpatient glycemic control: Lantus 15 units BID, Novolog 0-20 units Q4 hours  Inpatient Diabetes Program Recommendations:    5/11  IV Solumedrol decreased from 60 to 40 mg Q12. If steroid dose stays the same...   -  Consider increasing Lantus to 20-25 units BID   -  Also consider Novolog 8-10 units tid meal coverage in addition to correction if patient is consuming at least 50% of meals.  Thanks,  Tama Headings RN, MSN, BC-ADM Inpatient Diabetes Coordinator Team Pager [phone] (8a-5p)

## 2018-11-20 NOTE — Progress Notes (Signed)
Occupational Therapy Treatment Patient Details Name: Reginald [name] MRN: [id number] DOB: [birth year] Today's Date: 11/20/2018    History of present illness Pt adm with SOB and fever. Pt with PNA and COVID 19 positiive. PMH - morbid obesity, ckd, dm, chronic venous stasis, copd, chf, MI, afib, lumbar fusion   OT comments  Pt progressing towards established OT goals. However, presenting with increased dyspnea this session (DOE 3/4) with SpO2 98%-87% on RA and RR 31 with activity. Pt performing toileting with Min A for power up into standing from toilet. Pt performing hand hygiene at sink with Min guard A. After seated rest break, pt performing functional mobility in hallway with Min A and RW. Continue to recommend dc to SNF and will continue to follow acutely as admitted.    Follow Up Recommendations  SNF;Supervision/Assistance - 24 hour    Equipment Recommendations  Other (comment)(TBA)    Recommendations for Other Services      Precautions / Restrictions Precautions Precautions: Fall Precaution Comments: monitor O2 sats, states legs give out Restrictions Weight Bearing Restrictions: No       Mobility Bed Mobility               General bed mobility comments: In recliner upon arrival  Transfers Overall transfer level: Needs assistance Equipment used: Rolling walker (2 wheeled) Transfers: Sit to/from Stand Sit to Stand: Min assist;Mod assist         General transfer comment: Pt requiring Min A for first sit<>Stands. Requiring increased assistance for powering up into standing as session progressed    Balance Overall balance assessment: Needs assistance Sitting-balance support: No upper extremity supported;Feet supported Sitting balance-Leahy Scale: Fair     Standing balance support: Single extremity supported;During functional activity Standing balance-Leahy Scale: Poor Standing balance comment: Performing peri care with single hand on walker                            ADL either performed or assessed with clinical judgement   ADL Overall ADL's : Needs assistance/impaired     Grooming: Min guard;Wash/dry hands;Standing Grooming Details (indicate cue type and reason): Pt performing grooming at sink with Min Guard A for safety                 Toilet Transfer: Minimal assistance;Min guard;Ambulation;Regular Toilet;RW Armed forces technical officer Details (indicate cue type and reason): Min Guard A for safety to lower and Min A for power up into standing         Functional mobility during ADLs: Minimal assistance;Rolling walker;Min guard General ADL Comments: Pt demonstrating increased dyspnea this session and SpO2 >87% on RA     Vision       Perception     Praxis      Cognition Arousal/Alertness: Awake/alert Behavior During Therapy: WFL for tasks assessed/performed Overall Cognitive Status: Within Functional Limits for tasks assessed                                 General Comments: pt HOH requiring increased time and often repetition of instruction, alert and oriented basics        Exercises     Shoulder Instructions       General Comments SpO2 >87% on RA    Pertinent Vitals/ Pain       Pain Assessment: No/denies pain  Home Living  Prior Functioning/Environment              Frequency  Min 2X/week        Progress Toward Goals  OT Goals(current goals can now be found in the care plan section)  Progress towards OT goals: Progressing toward goals  Acute Rehab OT Goals Patient Stated Goal: Get up out of bed OT Goal Formulation: With patient Time For Goal Achievement: 11/30/18 Potential to Achieve Goals: Good ADL Goals Pt Will Perform Grooming: with supervision;standing Pt Will Perform Lower Body Bathing: with min guard assist;sit to/from stand Pt Will Perform Upper Body Dressing: with set-up;sitting Pt Will Perform Lower Body  Dressing: with min guard assist;sit to/from stand Pt Will Transfer to Toilet: with supervision;ambulating Pt Will Perform Toileting - Clothing Manipulation and hygiene: with supervision;sit to/from stand Pt/caregiver will Perform Home Exercise Program: Increased ROM;Increased strength;Both right and left upper extremity;With Supervision  Plan Discharge plan remains appropriate    Co-evaluation    PT/OT/SLP Co-Evaluation/Treatment: Yes Reason for Co-Treatment: To address functional/ADL transfers   OT goals addressed during session: ADL's and self-care      AM-PAC OT "6 Clicks" Daily Activity     Outcome Measure   Help from another person eating meals?: None Help from another person taking care of personal grooming?: A Little Help from another person toileting, which includes using toliet, bedpan, or urinal?: A Lot Help from another person bathing (including washing, rinsing, drying)?: A Lot Help from another person to put on and taking off regular upper body clothing?: A Little Help from another person to put on and taking off regular lower body clothing?: A Lot 6 Click Score: 16    End of Session Equipment Utilized During Treatment: Rolling walker;Gait belt  OT Visit Diagnosis: Muscle weakness (generalized) (M62.81);Unsteadiness on feet (R26.81)   Activity Tolerance Patient tolerated treatment well   Patient Left with call bell/phone within reach;in chair;with chair alarm set   Nurse Communication Mobility status        Time: 1194-1740 OT Time Calculation (min): 24 min  Charges: OT General Charges $OT Visit: 1 Visit OT Treatments $Self Care/Home Management : 8-22 mins  [place], OTR/L Acute Rehab Pager: [phone] Office: [place] 11/20/2018, 5:37 PM

## 2018-11-21 LAB — BASIC METABOLIC PANEL
Anion gap: 11 (ref 5–15)
BUN: 77 mg/dL — ABNORMAL HIGH (ref 8–23)
CO2: 25 mmol/L (ref 22–32)
Calcium: 9.1 mg/dL (ref 8.9–10.3)
Chloride: 104 mmol/L (ref 98–111)
Creatinine, Ser: 2.08 mg/dL — ABNORMAL HIGH (ref 0.61–1.24)
GFR calc Af Amer: 34 mL/min — ABNORMAL LOW (ref >60)
GFR calc non Af Amer: 29 mL/min — ABNORMAL LOW (ref >60)
Glucose, Bld: 295 mg/dL — ABNORMAL HIGH (ref 70–99)
Potassium: 4.1 mmol/L (ref 3.5–5.1)
Sodium: 140 mmol/L (ref 135–145)

## 2018-11-21 LAB — GLUCOSE, CAPILLARY
Glucose-Capillary: 278 mg/dL — ABNORMAL HIGH (ref 70–99)
Glucose-Capillary: 243 mg/dL — ABNORMAL HIGH (ref 70–99)
Glucose-Capillary: 315 mg/dL — ABNORMAL HIGH (ref 70–99)
Glucose-Capillary: 357 mg/dL — ABNORMAL HIGH (ref 70–99)
Glucose-Capillary: 327 mg/dL — ABNORMAL HIGH (ref 70–99)
Glucose-Capillary: 342 mg/dL — ABNORMAL HIGH (ref 70–99)

## 2018-11-21 MED ORDER — INSULIN ASPART 100 UNIT/ML ~~LOC~~ SOLN
5.0000 [IU] | Freq: Three times a day (TID) | SUBCUTANEOUS | Status: DC
  Administered 2018-11-22 – 2018-11-23 (×5): 5 [IU] via SUBCUTANEOUS

## 2018-11-21 MED ORDER — VITAMIN B-12 1000 MCG PO TABS
1000.0000 ug | ORAL_TABLET | Freq: Every day | ORAL | Status: DC
  Administered 2018-11-21 – 2018-11-27 (×7): 1000 ug via ORAL
  Filled 2018-11-21 (×9): qty 1

## 2018-11-21 MED ORDER — INSULIN ASPART 100 UNIT/ML ~~LOC~~ SOLN
0.0000 [IU] | Freq: Three times a day (TID) | SUBCUTANEOUS | Status: DC
  Administered 2018-11-22: 4 [IU] via SUBCUTANEOUS
  Administered 2018-11-22 (×2): 7 [IU] via SUBCUTANEOUS
  Administered 2018-11-23: 4 [IU] via SUBCUTANEOUS
  Administered 2018-11-23: 11 [IU] via SUBCUTANEOUS
  Administered 2018-11-23 – 2018-11-24 (×2): 4 [IU] via SUBCUTANEOUS
  Administered 2018-11-24 (×2): 3 [IU] via SUBCUTANEOUS
  Administered 2018-11-25 (×2): 4 [IU] via SUBCUTANEOUS
  Administered 2018-11-26: 7 [IU] via SUBCUTANEOUS
  Administered 2018-11-26 (×2): 4 [IU] via SUBCUTANEOUS
  Administered 2018-11-27: 7 [IU] via SUBCUTANEOUS
  Administered 2018-11-27: 3 [IU] via SUBCUTANEOUS

## 2018-11-21 MED ORDER — TORSEMIDE 20 MG PO TABS
40.0000 mg | ORAL_TABLET | Freq: Every day | ORAL | Status: DC
  Administered 2018-11-21 – 2018-11-23 (×3): 40 mg via ORAL
  Filled 2018-11-21 (×5): qty 2

## 2018-11-21 MED ORDER — VITAMIN C 500 MG PO TABS: 1000.0000 mg | ORAL_TABLET | Freq: Every day | ORAL | Status: DC

## 2018-11-21 MED ORDER — INSULIN ASPART 100 UNIT/ML ~~LOC~~ SOLN
0.0000 [IU] | Freq: Every day | SUBCUTANEOUS | Status: DC
  Administered 2018-11-21: 22:00:00 4 [IU] via SUBCUTANEOUS
  Administered 2018-11-24: 3 [IU] via SUBCUTANEOUS
  Administered 2018-11-25: 22:00:00 2 [IU] via SUBCUTANEOUS

## 2018-11-21 NOTE — Progress Notes (Signed)
[place] TEAM 1 - Stepdown/ICU TEAM  Reginald [name]  OYD:[id number] DOB: 08/19/1935 DOA: 11/15/2018 PCP: Guadlupe Spanish, MD    Brief Narrative:  83yo w/ a hx of atrial fibrillation, DM2, HTN, HLD, morbid obesity, and chronic venous stasis with chronic ulcers who presented to [facility] long ED from his SNF with shortness of breath and fever. Per EMS report, noted to be saturating in the mid 80s, initially requiring nonrebreather. Patient was noted to be febrile. CXR showed bibasilar opacities.    Significant Events: 5/7 admit   COVID-19 specific Treatment: Plaquenil  Actemra - dose 1 5/8 - dose 2 5/9  Subjective: Sitting up in a bedside chair.  Denies shortness of breath fevers chills nausea or vomiting.  Is mildly confused.  Assessment & Plan:  Acute Hypoxic Resp Failure - Covid 19 O2 requirement greatly decreased w/ pt now on 1L [place] only - no indication for further experimental tx at this time - mobilize    Chronic Persistent atrial fibrillation Continue metoprolol - chronically anticoagulated with Xarelto - rate controlled  Acute on chronic kidney disease stage IV Baseline creatinine not known but last creatinine in our system from 2018 was 1.27.  Creatinine has been stable in the 1.8-1.9 range.  Yesterday creatinine went up to 2.2 with a rise in BUN as well.  Patient's torsemide was stopped.  BUN noted to be higher today.  Creatinine 2.12.  Continue to monitor labs.  Continue to hold diuretics.  Potassium is 4.1.    HTN BP controlled at this time   Chronic diastolic CHF TTE 6720 showed normal systolic function at 55 to 60% - on torsemide at home but on hold now due to climbing crt - no gross overload on exam   DM2 CBG very poorly controlled - stop steroids - adjust insulin - follow   History of peripheral neuropathy Continue gabapentin  History of glaucoma Continue eyedrops  Hyperlipidemia Continue home medications.  Depression Continue Lexapro  Morbid  obesity - Body mass index is 47.64 kg/m.   DVT prophylaxis: Xarelto  Code Status: FULL CODE Family Communication:  Disposition Plan: to return to SNF   Consultants:  none  Antimicrobials:  Cefepime Vanc  Objective: Blood pressure 132/60, pulse 95, temperature 97.6 F (36.4 C), temperature source Oral, resp. rate (!) 22, height 5\' 10"  (1.778 m), weight (!) 150.6 kg, SpO2 92 %.  Intake/Output Summary (Last 24 hours) at 11/21/2018 1025 Last data filed at 11/21/2018 0428 Gross per 24 hour  Intake -  Output 900 ml  Net -900 ml   Filed Weights   11/18/18 0517 11/20/18 0500 11/21/18 0424  Weight: (!) 146.1 kg (!) 152.7 kg (!) 150.6 kg    Examination: General: No acute respiratory distress Lungs: Clear to auscultation bilaterally without wheezes or crackles Cardiovascular: Regular rate and rhythm without murmur gallop or rub normal S1 and S2 Abdomen: Nontender, nondistended, soft, bowel sounds positive, no rebound, no ascites, no appreciable mass Extremities: 1+ B LE edema   CBC: Recent Labs  Lab 11/18/18 0400 11/19/18 0500 11/20/18 0437  WBC 2.9* 8.4 11.6*  NEUTROABS 2.5 7.5 10.6*  HGB 12.3* 12.0* 12.2*  HCT 39.9 38.8* 39.7  MCV 85.8 87.0 85.6  PLT 204 209 947   Basic Metabolic Panel: Recent Labs  Lab 11/18/18 0400 11/19/18 0500 11/20/18 0437 11/21/18 0500  NA 138 137 137 140  K 4.3 4.3 4.1 4.1  CL 101 100 101 104  CO2 25 27 25 25   GLUCOSE 322*  380* 344* 295*  BUN 41* 56* 70* 77*  CREATININE 1.83* 2.20* 2.12* 2.08*  CALCIUM 8.2* 8.5* 8.7* 9.1  MG 2.2 2.3 2.3  --   PHOS 2.8 2.9 3.2  --    GFR: Estimated Creatinine Clearance: 41.7 mL/min (A) (by C-G formula based on SCr of 2.08 mg/dL (H)).  Liver Function Tests: Recent Labs  Lab 11/17/18 0405 11/18/18 0400 11/19/18 0500 11/20/18 0437  AST 54* 49* 34 79*  ALT 31 34 33 92*  ALKPHOS 55 57 55 54  BILITOT 0.8 0.8 0.5 0.7  PROT 6.0* 6.3* 6.0* 6.1*  ALBUMIN 2.9* 2.9* 2.7* 2.9*    HbA1C: Hgb A1c  MFr Bld  Date/Time Value Ref Range Status  11/16/2018 07:45 AM 8.3 (H) 4.8 - 5.6 % Final    Comment:    (NOTE) Pre diabetes:          5.7%-6.4% Diabetes:              >6.4% Glycemic control for   <7.0% adults with diabetes   10/27/2016 02:23 PM 6.9 (H) 4.8 - 5.6 % Final    Comment:    (NOTE)         Pre-diabetes: 5.7 - 6.4         Diabetes: >6.4         Glycemic control for adults with diabetes: <7.0     CBG: Recent Labs  Lab 11/20/18 1220 11/20/18 1801 11/20/18 2319 11/21/18 0419 11/21/18 0737  GLUCAP 346* 362* 396* 278* 243*    Recent Results (from the past 240 hour(s))  Blood Culture (routine x 2)     Status: None   Collection Time: 11/15/18  6:15 PM  Result Value Ref Range Status   Specimen Description   Final    BLOOD RIGHT ARM Performed at [facility], [address], [place], [place] [address]    Special Requests   Final    BOTTLES DRAWN AEROBIC AND ANAEROBIC Blood Culture adequate volume Performed at [place] [address] [place], [place] [address]    Culture   Final    NO GROWTH 5 DAYS Performed at [facility], [place] [address] [place], [place] [address]    Report Status 11/20/2018 FINAL  Final  Blood Culture (routine x 2)     Status: None   Collection Time: 11/15/18  6:20 PM  Result Value Ref Range Status   Specimen Description   Final    BLOOD LEFT ARM Performed at [facility], [place] [address], [place], [place] [address]    Special Requests   Final    BOTTLES DRAWN AEROBIC AND ANAEROBIC Blood Culture adequate volume Performed at [place] [address], [place], [place] [address]    Culture   Final    NO GROWTH 5 DAYS Performed at [facility], [place] [address] [place], [place] [address]    Report Status 11/20/2018 FINAL  Final  SARS Coronavirus 2 [facility] order, Performed in [place] hospital lab)     Status: Abnormal   Collection Time: 11/15/18  7:00 PM  Result  Value Ref Range Status   SARS Coronavirus 2 POSITIVE (A) NEGATIVE Final    Comment: RESULT CALLED TO, READ BACK BY AND VERIFIED WITH: Susa Day RN 2228 11/15/18 A NAVARRO (NOTE) If result is NEGATIVE SARS-CoV-2 target nucleic acids are NOT DETECTED. The SARS-CoV-2 RNA is generally detectable in upper and lower  respiratory specimens  during the acute phase of infection. The lowest  concentration of SARS-CoV-2 viral copies this assay can detect is 250  copies / mL. A negative result does not preclude SARS-CoV-2 infection  and should not be used as the sole basis for treatment or other  patient management decisions.  A negative result may occur with  improper specimen collection / handling, submission of specimen other  than nasopharyngeal swab, presence of viral mutation(s) within the  areas targeted by this assay, and inadequate number of viral copies  (<250 copies / mL). A negative result must be combined with clinical  observations, patient history, and epidemiological information. If result is POSITIVE SARS-CoV-2 target nucleic acids are DETECTED. T he SARS-CoV-2 RNA is generally detectable in upper and lower  respiratory specimens during the acute phase of infection.  Positive  results are indicative of active infection with SARS-CoV-2.  Clinical  correlation with patient history and other diagnostic information is  necessary to determine patient infection status.  Positive results do  not rule out bacterial infection or co-infection with other viruses. If result is PRESUMPTIVE POSTIVE SARS-CoV-2 nucleic acids MAY BE PRESENT.   A presumptive positive result was obtained on the submitted specimen  and confirmed on repeat testing.  While 2019 novel coronavirus  (SARS-CoV-2) nucleic acids may be present in the submitted sample  additional confirmatory testing may be necessary for epidemiological  and / or clinical management purposes  to differentiate between  SARS-CoV-2 and other  Sarbecovirus currently known to infect humans.  If clinically indicated additional testing with an alternate test  methodology [phone]) is  advised. The SARS-CoV-2 RNA is generally  detectable in upper and lower respiratory specimens during the acute  phase of infection. The expected result is Negative. Fact Sheet for Patients:  StrictlyIdeas.no Fact Sheet for Healthcare Providers: BankingDealers.co.za This test is not yet approved or cleared by the [place] FDA and has been authorized for detection and/or diagnosis of SARS-CoV-2 by FDA under an Emergency Use Authorization (EUA).  This EUA will remain in effect (meaning this test can be used) for the duration of the COVID-19 declaration under Section 564(b)(1) of the Act, 21 U.S.C. section 360bbb-3(b)(1), unless the authorization is terminated or revoked sooner. Performed at [facility], [place] [address] [place], [place] [address]   Respiratory Panel by PCR     Status: None   Collection Time: 11/15/18  7:00 PM  Result Value Ref Range Status   Adenovirus NOT DETECTED NOT DETECTED Final   Coronavirus 229E NOT DETECTED NOT DETECTED Final    Comment: (NOTE) The Coronavirus on the Respiratory Panel, DOES NOT test for the novel  Coronavirus (2019 nCoV)    Coronavirus HKU1 NOT DETECTED NOT DETECTED Final   Coronavirus NL63 NOT DETECTED NOT DETECTED Final   Coronavirus OC43 NOT DETECTED NOT DETECTED Final   Metapneumovirus NOT DETECTED NOT DETECTED Final   Rhinovirus / Enterovirus NOT DETECTED NOT DETECTED Final   Influenza A NOT DETECTED NOT DETECTED Final   Influenza B NOT DETECTED NOT DETECTED Final   Parainfluenza Virus 1 NOT DETECTED NOT DETECTED Final   Parainfluenza Virus 2 NOT DETECTED NOT DETECTED Final   Parainfluenza Virus 3 NOT DETECTED NOT DETECTED Final   Parainfluenza Virus 4 NOT DETECTED NOT DETECTED Final   Respiratory Syncytial Virus NOT DETECTED  NOT DETECTED Final   Bordetella pertussis NOT DETECTED NOT DETECTED Final   Chlamydophila pneumoniae NOT DETECTED NOT DETECTED Final   Mycoplasma pneumoniae NOT DETECTED NOT DETECTED  Final    Comment: Performed at [facility], [place] [address] [place], [place] [address]  MRSA PCR Screening     Status: Abnormal   Collection Time: 11/16/18  5:00 AM  Result Value Ref Range Status   MRSA by PCR POSITIVE (A) NEGATIVE Final    Comment:        The GeneXpert MRSA Assay (FDA approved for NASAL specimens only), is one component of a comprehensive MRSA colonization surveillance program. It is not intended to diagnose MRSA infection nor to guide or monitor treatment for MRSA infections. RESULT CALLED TO, READ BACK BY AND VERIFIED WITH: Glennon Mac RN AT 1046 ON 11/16/2018 BY S.VANHOORNE Performed at [facility], [place] [address], [place], [place] [address]      Scheduled Meds: . amLODipine  10 mg Oral Daily  . atorvastatin  20 mg Oral QHS  . escitalopram  5 mg Oral Daily  . famotidine  20 mg Oral Daily  . gabapentin  100 mg Oral TID  . insulin aspart  0-20 Units Subcutaneous Q4H  . insulin glargine  15 Units Subcutaneous BID  . latanoprost  1 drop Both Eyes QHS  . loratadine  10 mg Oral Daily  . magnesium oxide  400 mg Oral Daily  . mouth rinse  15 mL Mouth Rinse BID  . Melatonin  5 mg Oral QHS  . metoprolol tartrate  25 mg Oral Daily  . potassium chloride SA  20 mEq Oral Daily  . predniSONE  40 mg Oral Q breakfast  . Rivaroxaban  15 mg Oral Q breakfast  . vitamin C  500 mg Oral Daily  . zinc sulfate  220 mg Oral Daily   Continuous Infusions: . sodium chloride    . ceFEPime (MAXIPIME) IV 2 g (11/21/18 0851)     LOS: 6 days   Cherene Altes, MD [facility] Office  [phone] Pager - Text Page per Amion  If 7PM-7AM, please contact night-coverage per Amion 11/21/2018, 10:25 AM

## 2018-11-21 NOTE — Progress Notes (Signed)
Inpatient Diabetes Program Recommendations  AACE/ADA: New Consensus Statement on Inpatient Glycemic Control (2015)  Target Ranges:  Prepandial:   less than 140 mg/dL      Peak postprandial:   less than 180 mg/dL (1-2 hours)      Critically ill patients:  140 - 180 mg/dL   Results for Reginald [name], Reginald [name] (MRN [id number]) as of 11/21/2018 14:27  Ref. Range 11/20/2018 07:48 11/20/2018 12:20 11/20/2018 18:01 11/20/2018 23:19 11/21/2018 04:19 11/21/2018 07:37 11/21/2018 14:11  Glucose-Capillary Latest Ref Range: 70 - 99 mg/dL 315 (H) 346 (H) 362 (H) 396 (H) 278 (H) 243 (H) 315 (H)    Review of Glycemic Control  Diabetes history: DM 2 Outpatient Diabetes medications: Trulicity 0.5 mg weekly, Glucotrol 5 mg daily, Tradjenta 5 mg daily Current orders for Inpatient glycemic control: Lantus 15 units BID, Novolog 0-20 units Q4 hours  Inpatient Diabetes Program Recommendations:    IV Solumedrol decreased to PO prednisone 40 mg Daily   -  Consider Novolog 5 units tid meal coverage in addition to correction if patient is consuming at least 50% of meals.  Thanks,  Tama Headings RN, MSN, BC-ADM Inpatient Diabetes Coordinator Team Pager [phone] (8a-5p)

## 2018-11-21 NOTE — Progress Notes (Signed)
  Speech Language Pathology Treatment: Dysphagia  Patient Details Name: Reginald [name] MRN: [id number] DOB: Mar 09, 1936 Today's Date: 11/21/2018 Time: 6979-4801 SLP Time Calculation (min) (ACUTE ONLY): 18 min  Assessment / Plan / Recommendation Clinical Impression  SLP provided skilled observation as pt consumed his lunch meal, with swallow overall appearing grossly functional for age, although also with suspicion for possible esophageal hx given frequent eructation noted. Pt coughed once while eating a larger bite of spaghetti and meat sauce, saying that "a couple crumbs went down my throat." He denies this occurring with any increasing frequency ("not too often"). Recommend continuing current diet. Aspiration precautions were reviewed with pt. Will continue to follow although perhaps with indirect follow up still appropriate.   HPI HPI: 83 y.o. male with medical history significant for atrial fibrillation, type 2 diabetes mellitus, essential hypertension, HLD, morbid obesity, chronic venous stasis with chronic ulcers who presented to [facility] ED from SNF with reported shortness of breath and fever.  Per EMS report, noted to be saturating in the mid 80s, initially requiring nonrebreather.  Patient was noted to be febrile.  Chest x-ray showed bibasilar opacities.  Patient was hospitalized for further management. Found to have Acute respiratory failure with hypoxia secondary to COVID-19. Per MD order pt has been coughing with liquids.       SLP Plan  Continue with current plan of care       Recommendations  Diet recommendations: Regular;Thin liquid Liquids provided via: Cup;Straw Medication Administration: Whole meds with liquid Supervision: Patient able to self feed;Intermittent supervision to cue for compensatory strategies Compensations: Minimize environmental distractions;Slow rate;Small sips/bites Postural Changes and/or Swallow Maneuvers: Seated upright 90 degrees;Upright 30-60 min  after meal                Oral Care Recommendations: Oral care BID Follow up Recommendations: Skilled Nursing facility SLP Visit Diagnosis: Dysphagia, unspecified (R13.10) Plan: Continue with current plan of care       [place] Elizabethanne Lusher 11/21/2018, 3:34 PM  Pollyann Glen, M.A. [place] Acute Environmental education officer [phone] Office [phone]

## 2018-11-22 LAB — COMPREHENSIVE METABOLIC PANEL
ALT: 112 U/L — ABNORMAL HIGH (ref 0–44)
AST: 55 U/L — ABNORMAL HIGH (ref 15–41)
Albumin: 3.1 g/dL — ABNORMAL LOW (ref 3.5–5.0)
Alkaline Phosphatase: 62 U/L (ref 38–126)
Anion gap: 12 (ref 5–15)
BUN: 79 mg/dL — ABNORMAL HIGH (ref 8–23)
CO2: 27 mmol/L (ref 22–32)
Calcium: 9.6 mg/dL (ref 8.9–10.3)
Chloride: 102 mmol/L (ref 98–111)
Creatinine, Ser: 1.89 mg/dL — ABNORMAL HIGH (ref 0.61–1.24)
GFR calc Af Amer: 38 mL/min — ABNORMAL LOW (ref >60)
GFR calc non Af Amer: 33 mL/min — ABNORMAL LOW (ref >60)
Glucose, Bld: 245 mg/dL — ABNORMAL HIGH (ref 70–99)
Potassium: 3.8 mmol/L (ref 3.5–5.1)
Sodium: 141 mmol/L (ref 135–145)
Total Bilirubin: 1 mg/dL (ref 0.3–1.2)
Total Protein: 6.3 g/dL — ABNORMAL LOW (ref 6.5–8.1)

## 2018-11-22 LAB — CBC WITH DIFFERENTIAL/PLATELET
Abs Immature Granulocytes: 0.73 10*3/uL — ABNORMAL HIGH (ref 0.00–0.07)
Basophils Absolute: 0.1 10*3/uL (ref 0.0–0.1)
Basophils Relative: 0 %
Eosinophils Absolute: 0 10*3/uL (ref 0.0–0.5)
Eosinophils Relative: 0 %
HCT: 41.2 % (ref 39.0–52.0)
Hemoglobin: 12.8 g/dL — ABNORMAL LOW (ref 13.0–17.0)
Immature Granulocytes: 4 %
Lymphocytes Relative: 2 %
Lymphs Abs: 0.4 10*3/uL — ABNORMAL LOW (ref 0.7–4.0)
MCH: 26.2 pg (ref 26.0–34.0)
MCHC: 31.1 g/dL (ref 30.0–36.0)
MCV: 84.3 fL (ref 80.0–100.0)
Monocytes Absolute: 0.7 10*3/uL (ref 0.1–1.0)
Monocytes Relative: 4 %
Neutro Abs: 17.2 10*3/uL — ABNORMAL HIGH (ref 1.7–7.7)
Neutrophils Relative %: 90 %
Platelets: 289 10*3/uL (ref 150–400)
RBC: 4.89 MIL/uL (ref 4.22–5.81)
RDW: 16.5 % — ABNORMAL HIGH (ref 11.5–15.5)
WBC: 19.2 10*3/uL — ABNORMAL HIGH (ref 4.0–10.5)
nRBC: 0.1 % (ref 0.0–0.2)

## 2018-11-22 LAB — GLUCOSE, CAPILLARY
Glucose-Capillary: 224 mg/dL — ABNORMAL HIGH (ref 70–99)
Glucose-Capillary: 207 mg/dL — ABNORMAL HIGH (ref 70–99)
Glucose-Capillary: 161 mg/dL — ABNORMAL HIGH (ref 70–99)
Glucose-Capillary: 166 mg/dL — ABNORMAL HIGH (ref 70–99)

## 2018-11-22 MED ORDER — ESCITALOPRAM OXALATE 10 MG PO TABS
5.0000 mg | ORAL_TABLET | Freq: Every day | ORAL | Status: DC
  Administered 2018-11-22 – 2018-11-27 (×6): 5 mg via ORAL
  Filled 2018-11-22 (×7): qty 0.5

## 2018-11-22 NOTE — Progress Notes (Signed)
[name] TEAM 1 - Stepdown/ICU TEAM  PJ ZEHNER  EGB:[id number] DOB: 1936/02/18 DOA: 11/15/2018 PCP: Guadlupe Spanish, MD    Brief Narrative:  83yo w/ a hx of atrial fibrillation, DM2, HTN, HLD, morbid obesity, and chronic venous stasis with chronic ulcers who presented to [facility] ED 5/7 from his SNF with shortness of breath and fever. Per EMS report, noted to be saturating in the mid 80s, initially requiring nonrebreather. Patient was noted to be febrile. CXR showed bibasilar opacities.    Significant Events: 5/7 admit   COVID-19 specific Treatment: Plaquenil  Actemra - #1 5/8 - #2 5/9 Prednisone 5/13  Subjective: Is mildly confused. Sitting up in a bedside chair. Denies new complaints. No sob, n/v, or abdom pain. Denies cp.   Assessment & Plan:  Acute Hypoxic Resp Failure - Covid 19 Weaning toward room air today- no indication for further experimental tx at this time - mobilize    Chronic Persistent atrial fibrillation Continue metoprolol - chronically anticoagulated with Xarelto - rate controlled  Acute on chronic kidney disease stage IV Baseline creatinine not known but last creatinine in our system from 2018 was 1.27 -creatinine improved today despite resumption of diuretic yesterday -continue to follow trend  Recent Labs  Lab 11/18/18 0400 11/19/18 0500 11/20/18 0437 11/21/18 0500 11/22/18 0422  CREATININE 1.83* 2.20* 2.12* 2.08* 1.89*    HTN BP controlled   Chronic diastolic CHF TTE 3710 showed normal systolic function at 62-69% - on torsemide at home with doses held initially due to AKI - diuretic now resumed w/ improving renal fxn -recheck in a.m.  DM2 CBG improving with patient now off steroids -monitor without change today  History of peripheral neuropathy Continue gabapentin  History of glaucoma Continue eyedrops  Hyperlipidemia Continue home medications.  Depression Continue Lexapro  Morbid obesity - Body mass index is 47.64  kg/m.   DVT prophylaxis: Xarelto  Code Status: FULL CODE Family Communication:  Disposition Plan: to return to SNF in 24-48 hours  Consultants:  none  Antimicrobials:  Cefepime 5/7 > 5/13 Vanc 5/7 > 5/9  Objective: Blood pressure 96/67, pulse 68, temperature (!) 97.5 F (36.4 C), temperature source Oral, resp. rate 19, height 5\' 10"  (1.778 m), weight (!) 150.6 kg, SpO2 96 %.  Intake/Output Summary (Last 24 hours) at 11/22/2018 1030 Last data filed at 11/22/2018 0500 Gross per 24 hour  Intake -  Output 3850 ml  Net -3850 ml   Filed Weights   11/18/18 0517 11/20/18 0500 11/21/18 0424  Weight: (!) 146.1 kg (!) 152.7 kg (!) 150.6 kg    Examination: General: No acute respiratory distress sitting up in bedside chair Lungs: Minimal bibasilar crackles with good air movement throughout otherwise Cardiovascular: Irregularly irregular with rate controlled without murmur Abdomen: NT/ND, soft, bowel sounds positive, no rebound Extremities: 1+ B LE edema without significant change  CBC: Recent Labs  Lab 11/19/18 0500 11/20/18 0437 11/22/18 0422  WBC 8.4 11.6* 19.2*  NEUTROABS 7.5 10.6* 17.2*  HGB 12.0* 12.2* 12.8*  HCT 38.8* 39.7 41.2  MCV 87.0 85.6 84.3  PLT 209 249 485   Basic Metabolic Panel: Recent Labs  Lab 11/18/18 0400 11/19/18 0500 11/20/18 0437 11/21/18 0500 11/22/18 0422  NA 138 137 137 140 141  K 4.3 4.3 4.1 4.1 3.8  CL 101 100 101 104 102  CO2 25 27 25 25 27   GLUCOSE 322* 380* 344* 295* 245*  BUN 41* 56* 70* 77* 79*  CREATININE 1.83* 2.20* 2.12* 2.08*  1.89*  CALCIUM 8.2* 8.5* 8.7* 9.1 9.6  MG 2.2 2.3 2.3  --   --   PHOS 2.8 2.9 3.2  --   --    GFR: Estimated Creatinine Clearance: 45.9 mL/min (A) (by C-G formula based on SCr of 1.89 mg/dL (H)).  Liver Function Tests: Recent Labs  Lab 11/18/18 0400 11/19/18 0500 11/20/18 0437 11/22/18 0422  AST 49* 34 79* 55*  ALT 34 33 92* 112*  ALKPHOS 57 55 54 62  BILITOT 0.8 0.5 0.7 1.0  PROT 6.3*  6.0* 6.1* 6.3*  ALBUMIN 2.9* 2.7* 2.9* 3.1*    HbA1C: Hgb A1c MFr Bld  Date/Time Value Ref Range Status  11/16/2018 07:45 AM 8.3 (H) 4.8 - 5.6 % Final    Comment:    (NOTE) Pre diabetes:          5.7%-6.4% Diabetes:              >6.4% Glycemic control for   <7.0% adults with diabetes   10/27/2016 02:23 PM 6.9 (H) 4.8 - 5.6 % Final    Comment:    (NOTE)         Pre-diabetes: 5.7 - 6.4         Diabetes: >6.4         Glycemic control for adults with diabetes: <7.0     CBG: Recent Labs  Lab 11/21/18 1411 11/21/18 1608 11/21/18 2006 11/21/18 2202 11/22/18 0738  GLUCAP 315* 357* 327* 342* 224*    Recent Results (from the past 240 hour(s))  Blood Culture (routine x 2)     Status: None   Collection Time: 11/15/18  6:15 PM  Result Value Ref Range Status   Specimen Description   Final    BLOOD RIGHT ARM Performed at [facility], [address], [place], [place] [address]    Special Requests   Final    BOTTLES DRAWN AEROBIC AND ANAEROBIC Blood Culture adequate volume Performed at [facility] [address], [place], [place] [address]    Culture   Final    NO GROWTH 5 DAYS Performed at [facility], [place] [address] [place], [place] [address]    Report Status 11/20/2018 FINAL  Final  Blood Culture (routine x 2)     Status: None   Collection Time: 11/15/18  6:20 PM  Result Value Ref Range Status   Specimen Description   Final    BLOOD LEFT ARM Performed at [facility], [place] [address] [place], [place] [address]    Special Requests   Final    BOTTLES DRAWN AEROBIC AND ANAEROBIC Blood Culture adequate volume Performed at [facility] [address], [place], [place] [address]    Culture   Final    NO GROWTH 5 DAYS Performed at [facility], [place] [address], [place], [place] [address]    Report Status 11/20/2018 FINAL  Final  SARS Coronavirus 2 [facility] order, Performed in [facility] hospital lab)      Status: Abnormal   Collection Time: 11/15/18  7:00 PM  Result Value Ref Range Status   SARS Coronavirus 2 POSITIVE (A) NEGATIVE Final    Comment: RESULT CALLED TO, READ BACK BY AND VERIFIED WITH: Susa Day RN 2228 11/15/18 A NAVARRO (NOTE) If result is NEGATIVE SARS-CoV-2 target nucleic acids are NOT DETECTED. The SARS-CoV-2 RNA is generally detectable in upper and lower  respiratory specimens during the acute phase of infection. The  lowest  concentration of SARS-CoV-2 viral copies this assay can detect is 250  copies / mL. A negative result does not preclude SARS-CoV-2 infection  and should not be used as the sole basis for treatment or other  patient management decisions.  A negative result may occur with  improper specimen collection / handling, submission of specimen other  than nasopharyngeal swab, presence of viral mutation(s) within the  areas targeted by this assay, and inadequate number of viral copies  (<250 copies / mL). A negative result must be combined with clinical  observations, patient history, and epidemiological information. If result is POSITIVE SARS-CoV-2 target nucleic acids are DETECTED. T he SARS-CoV-2 RNA is generally detectable in upper and lower  respiratory specimens during the acute phase of infection.  Positive  results are indicative of active infection with SARS-CoV-2.  Clinical  correlation with patient history and other diagnostic information is  necessary to determine patient infection status.  Positive results do  not rule out bacterial infection or co-infection with other viruses. If result is PRESUMPTIVE POSTIVE SARS-CoV-2 nucleic acids MAY BE PRESENT.   A presumptive positive result was obtained on the submitted specimen  and confirmed on repeat testing.  While 2019 novel coronavirus  (SARS-CoV-2) nucleic acids may be present in the submitted sample  additional confirmatory testing may be necessary for epidemiological  and / or clinical  management purposes  to differentiate between  SARS-CoV-2 and other Sarbecovirus currently known to infect humans.  If clinically indicated additional testing with an alternate test  methodology [phone]) is  advised. The SARS-CoV-2 RNA is generally  detectable in upper and lower respiratory specimens during the acute  phase of infection. The expected result is Negative. Fact Sheet for Patients:  StrictlyIdeas.no Fact Sheet for Healthcare Providers: BankingDealers.co.za This test is not yet approved or cleared by the [place] FDA and has been authorized for detection and/or diagnosis of SARS-CoV-2 by FDA under an Emergency Use Authorization (EUA).  This EUA will remain in effect (meaning this test can be used) for the duration of the COVID-19 declaration under Section 564(b)(1) of the Act, 21 U.S.C. section 360bbb-3(b)(1), unless the authorization is terminated or revoked sooner. Performed at [facility], [place] [address], [place], [place] [address]   Respiratory Panel by PCR     Status: None   Collection Time: 11/15/18  7:00 PM  Result Value Ref Range Status   Adenovirus NOT DETECTED NOT DETECTED Final   Coronavirus 229E NOT DETECTED NOT DETECTED Final    Comment: (NOTE) The Coronavirus on the Respiratory Panel, DOES NOT test for the novel  Coronavirus (2019 nCoV)    Coronavirus HKU1 NOT DETECTED NOT DETECTED Final   Coronavirus NL63 NOT DETECTED NOT DETECTED Final   Coronavirus OC43 NOT DETECTED NOT DETECTED Final   Metapneumovirus NOT DETECTED NOT DETECTED Final   Rhinovirus / Enterovirus NOT DETECTED NOT DETECTED Final   Influenza A NOT DETECTED NOT DETECTED Final   Influenza B NOT DETECTED NOT DETECTED Final   Parainfluenza Virus 1 NOT DETECTED NOT DETECTED Final   Parainfluenza Virus 2 NOT DETECTED NOT DETECTED Final   Parainfluenza Virus 3 NOT DETECTED NOT DETECTED Final   Parainfluenza Virus 4 NOT  DETECTED NOT DETECTED Final   Respiratory Syncytial Virus NOT DETECTED NOT DETECTED Final   Bordetella pertussis NOT DETECTED NOT DETECTED Final   Chlamydophila pneumoniae NOT DETECTED NOT DETECTED Final   Mycoplasma pneumoniae NOT DETECTED NOT DETECTED Final    Comment: Performed at  [facility], [place] [address], [place], [place] [address]  MRSA PCR Screening     Status: Abnormal   Collection Time: 11/16/18  5:00 AM  Result Value Ref Range Status   MRSA by PCR POSITIVE (A) NEGATIVE Final    Comment:        The GeneXpert MRSA Assay (FDA approved for NASAL specimens only), is one component of a comprehensive MRSA colonization surveillance program. It is not intended to diagnose MRSA infection nor to guide or monitor treatment for MRSA infections. RESULT CALLED TO, READ BACK BY AND VERIFIED WITH: Glennon Mac RN AT 1046 ON 11/16/2018 BY S.VANHOORNE Performed at [facility], [place] [address] [place], [place] [address]      Scheduled Meds: . amLODipine  10 mg Oral Daily  . atorvastatin  20 mg Oral QHS  . escitalopram  5 mg Oral Daily  . famotidine  20 mg Oral Daily  . gabapentin  100 mg Oral TID  . insulin aspart  0-20 Units Subcutaneous TID WC  . insulin aspart  0-5 Units Subcutaneous QHS  . insulin aspart  5 Units Subcutaneous TID WC  . insulin glargine  15 Units Subcutaneous BID  . latanoprost  1 drop Both Eyes QHS  . loratadine  10 mg Oral Daily  . magnesium oxide  400 mg Oral Daily  . mouth rinse  15 mL Mouth Rinse BID  . Melatonin  5 mg Oral QHS  . metoprolol tartrate  25 mg Oral Daily  . potassium chloride SA  20 mEq Oral Daily  . Rivaroxaban  15 mg Oral Q breakfast  . torsemide  40 mg Oral Daily  . vitamin B-12  1,000 mcg Oral Daily  . vitamin C  500 mg Oral Daily  . zinc sulfate  220 mg Oral Daily     LOS: 7 days   Cherene Altes, MD [facility] Office  [phone] Pager - Text Page per Amion  If 7PM-7AM, please contact  night-coverage per Amion 11/22/2018, 10:30 AM

## 2018-11-22 NOTE — Progress Notes (Signed)
Pt is having a slower reaction time to answering questions, has episodes of confusion but is easy to reorient Made Dr Thereasa Solo aware of changes. No new orders at this time will continue  to monitor.

## 2018-11-22 NOTE — Progress Notes (Signed)
Physical Therapy Treatment Patient Details Name: Reginald [name] MRN: [id number] DOB: 12-18-1935 Today's Date: 11/22/2018    History of Present Illness Pt adm with SOB and fever. Pt with PNA and COVID 19 positiive. PMH - morbid obesity, ckd, dm, chronic venous stasis, copd, chf, MI, afib, lumbar fusion    PT Comments    The patient appears and presents with decreased stamina, requires more assistance for mobility. Patient requiring O2 as opposed to ambulation on RA 2 days ago x 70'. Patient is more sluggish in responses. Patient only ambulated in room today due to increased fatigue and needing  More assitance and oxygen.Patient sats dropped to ~ 88% on 2 liters.At rest, O2 sats 94% on 2 L. Continue mobility progression.  Follow Up Recommendations  SNF(will need PT at SNF)     Equipment Recommendations  None recommended by PT    Recommendations for Other Services       Precautions / Restrictions Precautions Precautions: Fall Precaution Comments: monitor O2 sats, states legs give out, has been  dropping sats more, needs Oxygen for ambulation now    Mobility  Bed Mobility               General bed mobility comments: oob  Transfers Overall transfer level: Needs assistance Equipment used: Rolling walker (2 wheeled) Transfers: Sit to/from Stand Sit to Stand: Mod assist;+2 safety/equipment         General transfer comment: required multimodal cues for hand placement to stand from recliner and toilet. Patient very slow to rise and required assistance to power up from recliner, required multiple attempts x 2 events.  Ambulation/Gait Ambulation/Gait assistance: Min assist;+2 safety/equipment Gait Distance (Feet): 30 Feet(x 2) Assistive device: Rolling walker (2 wheeled) Gait Pattern/deviations: Step-through pattern     General Gait Details: ambulated on 2 liters with sats down into high 80's. At rest, 92%.   Stairs             Wheelchair Mobility     Modified Rankin (Stroke Patients Only)       Balance Overall balance assessment: Needs assistance Sitting-balance support: No upper extremity supported;Feet supported Sitting balance-Leahy Scale: Fair     Standing balance support: During functional activity;Bilateral upper extremity supported Standing balance-Leahy Scale: Poor Standing balance comment: reliant on UEs                            Cognition Arousal/Alertness: Awake/alert Behavior During Therapy: WFL for tasks assessed/performed;Flat affect Overall Cognitive Status: Impaired/Different from baseline Area of Impairment: Following commands                 Orientation Level: Time;Situation             General Comments: patient appears slower to respond today, Required more cues for mobiliity.      Exercises      General Comments        Pertinent Vitals/Pain Pain Assessment: Faces Faces Pain Scale: No hurt    Home Living                      Prior Function            PT Goals (current goals can now be found in the care plan section) Progress towards PT goals: Progressing toward goals    Frequency    Min 4X/week      PT Plan Current plan remains appropriate    Co-evaluation  AM-PAC PT "6 Clicks" Mobility   Outcome Measure  Help needed turning from your back to your side while in a flat bed without using bedrails?: A Lot Help needed moving from lying on your back to sitting on the side of a flat bed without using bedrails?: A Lot Help needed moving to and from a bed to a chair (including a wheelchair)?: A Lot Help needed standing up from a chair using your arms (e.g., wheelchair or bedside chair)?: A Lot Help needed to walk in hospital room?: A Lot Help needed climbing 3-5 steps with a railing? : Total 6 Click Score: 11    End of Session Equipment Utilized During Treatment: Gait belt Activity Tolerance: Patient limited by fatigue Patient  left: in chair;with call bell/phone within reach;with chair alarm set Nurse Communication: Mobility status PT Visit Diagnosis: Unsteadiness on feet (R26.81)     Time: 1587-2761 PT Time Calculation (min) (ACUTE ONLY): 39 min  Charges:  $Gait Training: 23-37 mins $Self Care/Home Management: [place] Pager [phone] Office [phone]    Claretha Cooper 11/22/2018, 5:22 PM

## 2018-11-23 LAB — GLUCOSE, CAPILLARY
Glucose-Capillary: 343 mg/dL — ABNORMAL HIGH (ref 70–99)
Glucose-Capillary: 379 mg/dL — ABNORMAL HIGH (ref 70–99)
Glucose-Capillary: 187 mg/dL — ABNORMAL HIGH (ref 70–99)
Glucose-Capillary: 252 mg/dL — ABNORMAL HIGH (ref 70–99)
Glucose-Capillary: 160 mg/dL — ABNORMAL HIGH (ref 70–99)
Glucose-Capillary: 145 mg/dL — ABNORMAL HIGH (ref 70–99)

## 2018-11-23 LAB — BASIC METABOLIC PANEL WITH GFR
Anion gap: 13 (ref 5–15)
BUN: 93 mg/dL — ABNORMAL HIGH (ref 8–23)
CO2: 31 mmol/L (ref 22–32)
Calcium: 9.8 mg/dL (ref 8.9–10.3)
Chloride: 100 mmol/L (ref 98–111)
Creatinine, Ser: 2.23 mg/dL — ABNORMAL HIGH (ref 0.61–1.24)
GFR calc Af Amer: 31 mL/min — ABNORMAL LOW (ref >60)
GFR calc non Af Amer: 27 mL/min — ABNORMAL LOW (ref >60)
Glucose, Bld: 209 mg/dL — ABNORMAL HIGH (ref 70–99)
Potassium: 3.8 mmol/L (ref 3.5–5.1)
Sodium: 144 mmol/L (ref 135–145)

## 2018-11-23 LAB — CBC
HCT: 42.9 % (ref 39.0–52.0)
Hemoglobin: 13.1 g/dL (ref 13.0–17.0)
MCH: 26 pg (ref 26.0–34.0)
MCHC: 30.5 g/dL (ref 30.0–36.0)
MCV: 85.1 fL (ref 80.0–100.0)
Platelets: 298 K/uL (ref 150–400)
RBC: 5.04 MIL/uL (ref 4.22–5.81)
RDW: 16.8 % — ABNORMAL HIGH (ref 11.5–15.5)
WBC: 15.2 K/uL — ABNORMAL HIGH (ref 4.0–10.5)
nRBC: 0.4 % — ABNORMAL HIGH (ref 0.0–0.2)

## 2018-11-23 MED ORDER — INSULIN ASPART 100 UNIT/ML ~~LOC~~ SOLN
8.0000 [IU] | Freq: Three times a day (TID) | SUBCUTANEOUS | Status: DC
  Administered 2018-11-23: 8 [IU] via SUBCUTANEOUS

## 2018-11-23 NOTE — Progress Notes (Addendum)
Occupational Therapy Treatment Patient Details Name: Reginald [name] MRN: [id number] DOB: 1935/10/18 Today's Date: 11/23/2018    History of present illness Pt adm with SOB and fever. Pt with PNA and COVID 19 positiive. PMH - morbid obesity, ckd, dm, chronic venous stasis, copd, chf, MI, afib, lumbar fusion   OT comments  Pt with decreased mobility and ADL status today as well as decreased cognition. Pt intermittently lethargic, noted significant delay with processing/response to commands and pt also requiring max multimodal cues to respond to some questions as well as to follow through with functional tasks and transfers (RN made aware). Pt requiring overall modA (+2 safety) for stand pivot transfers today using both RW and HHA. Pt performing toileting task during session with maxA. Pt on 3L O2 during session with lowest O2 sat noted 83%, returning to >90% within approx 30 seconds and seated rest. BP taken once returned to recliner after toileting task, initially 90/41, taken approx 4 min later and with LEs elevated and BP 110/67. Feel SNF recommendation remains appropriate at this time. Will continue to follow acutely.   Follow Up Recommendations  SNF;Supervision/Assistance - 24 hour    Equipment Recommendations  Other (comment)(TBA)          Precautions / Restrictions Precautions Precautions: Fall Precaution Comments: monitor O2 sats, states legs give out, has been  dropping sats more, needs Oxygen for ambulation now Restrictions Weight Bearing Restrictions: No       Mobility Bed Mobility Overal bed mobility: Needs Assistance Bed Mobility: Supine to Sit     Supine to sit: Max assist     General bed mobility comments: max cues to initiate LEs towards EOB, assist for hips and trunk elevation  Transfers Overall transfer level: Needs assistance Equipment used: Rolling walker (2 wheeled);1 person hand held assist Transfers: Sit to/from [facility] Sit to  Stand: Min assist;Mod assist;+2 safety/equipment Stand pivot transfers: Mod assist;+2 safety/equipment       General transfer comment: boosting assist and to steady in standing; MAX multiomdal cues for hand placement and to take steps during transition; pt transferred EOB>recliner using RW, recliner<>toilet in bathroom using grab bars and HHA; performed stand pivot transfers as pt with slower movements and delayed processing and deemed not safe to attempt mobility from room to bathroom today    Balance Overall balance assessment: Needs assistance Sitting-balance support: No upper extremity supported;Feet supported Sitting balance-Leahy Scale: Fair     Standing balance support: During functional activity;Bilateral upper extremity supported Standing balance-Leahy Scale: Poor Standing balance comment: reliant on UEs                           ADL either performed or assessed with clinical judgement   ADL Overall ADL's : Needs assistance/impaired                         Toilet Transfer: Moderate assistance;+2 for safety/equipment;Stand-pivot Toilet Transfer Details (indicate cue type and reason): max multimodal cues for transfer completion Toileting- Clothing Manipulation and Hygiene: Maximal assistance;+2 for safety/equipment;Sit to/from stand Toileting - Clothing Manipulation Details (indicate cue type and reason): assist for peri-care after BM in standing     Functional mobility during ADLs: Moderate assistance;Rolling walker;+2 for safety/equipment(and HHA) General ADL Comments: pt with decline in mobility and ADL status today, noted worsening cognition     Vision       Perception     Praxis  Cognition Arousal/Alertness: Awake/alert Behavior During Therapy: WFL for tasks assessed/performed;Flat affect Overall Cognitive Status: Impaired/Different from baseline Area of Impairment: Following commands;Problem  solving;Awareness;Safety/judgement;Attention                 Orientation Level: Time;Situation Current Attention Level: Sustained   Following Commands: Follows one step commands inconsistently;Follows one step commands with increased time Safety/Judgement: Decreased awareness of deficits Awareness: Intellectual Problem Solving: Slow processing;Decreased initiation;Difficulty sequencing;Requires verbal cues;Requires tactile cues General Comments: patient appears slower to respond today, Required frequent multimodal cues for all ADL and mobility tasks; intermittently dozing off at start and end of sesion        Exercises     Shoulder Instructions       General Comments      Pertinent Vitals/ Pain       Pain Assessment: No/denies pain Faces Pain Scale: No hurt  Home Living                                          Prior Functioning/Environment              Frequency  Min 2X/week        Progress Toward Goals  OT Goals(current goals can now be found in the care plan section)  Progress towards OT goals: OT to reassess next treatment  Acute Rehab OT Goals Patient Stated Goal: none stated today OT Goal Formulation: With patient Time For Goal Achievement: 11/30/18 Potential to Achieve Goals: Good  Plan Discharge plan remains appropriate    Co-evaluation                 AM-PAC OT "6 Clicks" Daily Activity     Outcome Measure   Help from another person eating meals?: None Help from another person taking care of personal grooming?: A Little Help from another person toileting, which includes using toliet, bedpan, or urinal?: A Lot Help from another person bathing (including washing, rinsing, drying)?: A Lot Help from another person to put on and taking off regular upper body clothing?: A Lot Help from another person to put on and taking off regular lower body clothing?: A Lot 6 Click Score: 15    End of Session Equipment Utilized  During Treatment: Rolling walker;Gait belt;Oxygen(3L)  OT Visit Diagnosis: Muscle weakness (generalized) (M62.81);Unsteadiness on feet (R26.81)   Activity Tolerance Patient tolerated treatment well;Patient limited by lethargy   Patient Left in chair;with call bell/phone within reach;with chair alarm set   Nurse Communication Mobility status(pt with increased confusion, weakness today)        Time: 9242-6834 OT Time Calculation (min): 51 min  Charges: OT General Charges $OT Visit: 1 Visit OT Treatments $Self Care/Home Management : 38-52 mins  Lou Cal, [place] Pager [phone] Office [place] 11/23/2018, 4:42 PM

## 2018-11-23 NOTE — Progress Notes (Signed)
[facility] TEAM 1 - Stepdown/ICU TEAM  ROBBERT LANGLINAIS  DJS:[id number] DOB: 1936-04-10 DOA: 11/15/2018 PCP: Guadlupe Spanish, MD    Brief Narrative:  83yo w/ a hx of atrial fibrillation, DM2, HTN, HLD, morbid obesity, and chronic venous stasis with chronic ulcers who presented to [place] ED 5/7 from his SNF with shortness of breath and fever. Per EMS report, noted to be saturating in the mid 80s, initially requiring nonrebreather. Patient was noted to be febrile. CXR showed bibasilar opacities.    Significant Events: 5/7 admit   COVID-19 specific Treatment: Plaquenil  Actemra - #1 5/8 - #2 5/9 Prednisone 5/13  Subjective: Sats in 90s on RA. Clinically stable, but confused. Denies cp, n/v, abdom pain. Tells me he is looking forward to returning to [facility].   Assessment & Plan:  Acute Hypoxic Resp Failure - Covid 19 no indication for further experimental tx at this time - clinically stabilizing - mobilize    Chronic Persistent atrial fibrillation Continue metoprolol - chronically anticoagulated with Xarelto - rate controlled  Acute on chronic kidney disease stage IV Baseline creatinine not known but last creatinine in our system from 2018 was 1.27 -creatinine continues to fluctuate - cont diuretic for now - recheck crt in AM   Recent Labs  Lab 11/19/18 0500 11/20/18 0437 11/21/18 0500 11/22/18 0422 11/23/18 0548  CREATININE 2.20* 2.12* 2.08* 1.89* 2.23*    HTN BP controlled   Chronic diastolic CHF TTE 8850 showed normal systolic function at 27-74% - on torsemide at home with doses held initially due to AKI - diuretic resumed   DM2 monitor CBG without change today  History of peripheral neuropathy Continue gabapentin  History of glaucoma Continue eyedrops  Hyperlipidemia Continue home medications.  Depression Continue Lexapro  Morbid obesity - Body mass index is 45.04 kg/m.   DVT prophylaxis: Xarelto  Code Status: FULL CODE Family  Communication:  Disposition Plan: to return to SNF 5/16 if renal function stable/improved   Consultants:  none  Antimicrobials:  Cefepime 5/7 > 5/13 Vanc 5/7 > 5/9  Objective: Blood pressure 135/89, pulse 98, temperature 97.6 F (36.4 C), temperature source Oral, resp. rate 17, height 5\' 10"  (1.778 m), weight (!) 142.4 kg, SpO2 94 %.  Intake/Output Summary (Last 24 hours) at 11/23/2018 0914 Last data filed at 11/23/2018 1287 Gross per 24 hour  Intake 720 ml  Output 1350 ml  Net -630 ml   Filed Weights   11/20/18 0500 11/21/18 0424 11/23/18 0500  Weight: (!) 152.7 kg (!) 150.6 kg (!) 142.4 kg    Examination: General: No acute respiratory distress  Lungs: Minimal bibasilar crackles - no wheezing  Cardiovascular: Irregularly irregular - rate controlled  Abdomen: NT/ND, soft, bowel sounds positive Extremities: 1+ B LE edema   CBC: Recent Labs  Lab 11/19/18 0500 11/20/18 0437 11/22/18 0422 11/23/18 0548  WBC 8.4 11.6* 19.2* 15.2*  NEUTROABS 7.5 10.6* 17.2*  --   HGB 12.0* 12.2* 12.8* 13.1  HCT 38.8* 39.7 41.2 42.9  MCV 87.0 85.6 84.3 85.1  PLT 209 249 289 867   Basic Metabolic Panel: Recent Labs  Lab 11/18/18 0400 11/19/18 0500 11/20/18 0437 11/21/18 0500 11/22/18 0422 11/23/18 0548  NA 138 137 137 140 141 144  K 4.3 4.3 4.1 4.1 3.8 3.8  CL 101 100 101 104 102 100  CO2 25 27 25 25 27 31   GLUCOSE 322* 380* 344* 295* 245* 209*  BUN 41* 56* 70* 77* 79* 93*  CREATININE 1.83*  2.20* 2.12* 2.08* 1.89* 2.23*  CALCIUM 8.2* 8.5* 8.7* 9.1 9.6 9.8  MG 2.2 2.3 2.3  --   --   --   PHOS 2.8 2.9 3.2  --   --   --    GFR: Estimated Creatinine Clearance: 37.7 mL/min (A) (by C-G formula based on SCr of 2.23 mg/dL (H)).  Liver Function Tests: Recent Labs  Lab 11/18/18 0400 11/19/18 0500 11/20/18 0437 11/22/18 0422  AST 49* 34 79* 55*  ALT 34 33 92* 112*  ALKPHOS 57 55 54 62  BILITOT 0.8 0.5 0.7 1.0  PROT 6.3* 6.0* 6.1* 6.3*  ALBUMIN 2.9* 2.7* 2.9* 3.1*     HbA1C: Hgb A1c MFr Bld  Date/Time Value Ref Range Status  11/16/2018 07:45 AM 8.3 (H) 4.8 - 5.6 % Final    Comment:    (NOTE) Pre diabetes:          5.7%-6.4% Diabetes:              >6.4% Glycemic control for   <7.0% adults with diabetes   10/27/2016 02:23 PM 6.9 (H) 4.8 - 5.6 % Final    Comment:    (NOTE)         Pre-diabetes: 5.7 - 6.4         Diabetes: >6.4         Glycemic control for adults with diabetes: <7.0     CBG: Recent Labs  Lab 11/22/18 0738 11/22/18 1201 11/22/18 1620 11/22/18 2111 11/23/18 0901  GLUCAP 224* 207* 161* 166* 187*    Recent Results (from the past 240 hour(s))  Blood Culture (routine x 2)     Status: None   Collection Time: 11/15/18  6:15 PM  Result Value Ref Range Status   Specimen Description   Final    BLOOD RIGHT ARM Performed at [facility], [place] [address], [place], [place] [address]    Special Requests   Final    BOTTLES DRAWN AEROBIC AND ANAEROBIC Blood Culture adequate volume Performed at [place] [address], [place], [place] [address]    Culture   Final    NO GROWTH 5 DAYS Performed at [facility], [place] [address], [place], [place] [address]    Report Status 11/20/2018 FINAL  Final  Blood Culture (routine x 2)     Status: None   Collection Time: 11/15/18  6:20 PM  Result Value Ref Range Status   Specimen Description   Final    BLOOD LEFT ARM Performed at [facility], [place] [address] [place], [place] [address]    Special Requests   Final    BOTTLES DRAWN AEROBIC AND ANAEROBIC Blood Culture adequate volume Performed at [place] [address], [place], [place] [address]    Culture   Final    NO GROWTH 5 DAYS Performed at [facility], [place] [address] [place], [place] [address]    Report Status 11/20/2018 FINAL  Final  SARS Coronavirus 2 [facility] order, Performed in [place] hospital lab)     Status: Abnormal   Collection Time: 11/15/18   7:00 PM  Result Value Ref Range Status   SARS Coronavirus 2 POSITIVE (A) NEGATIVE Final    Comment: RESULT CALLED TO, READ BACK BY AND VERIFIED WITH: Susa Day RN 2228 11/15/18 A NAVARRO (NOTE) If result is NEGATIVE SARS-CoV-2 target nucleic acids are NOT DETECTED. The SARS-CoV-2 RNA is generally detectable in upper and  lower  respiratory specimens during the acute phase of infection. The lowest  concentration of SARS-CoV-2 viral copies this assay can detect is 250  copies / mL. A negative result does not preclude SARS-CoV-2 infection  and should not be used as the sole basis for treatment or other  patient management decisions.  A negative result may occur with  improper specimen collection / handling, submission of specimen other  than nasopharyngeal swab, presence of viral mutation(s) within the  areas targeted by this assay, and inadequate number of viral copies  (<250 copies / mL). A negative result must be combined with clinical  observations, patient history, and epidemiological information. If result is POSITIVE SARS-CoV-2 target nucleic acids are DETECTED. T he SARS-CoV-2 RNA is generally detectable in upper and lower  respiratory specimens during the acute phase of infection.  Positive  results are indicative of active infection with SARS-CoV-2.  Clinical  correlation with patient history and other diagnostic information is  necessary to determine patient infection status.  Positive results do  not rule out bacterial infection or co-infection with other viruses. If result is PRESUMPTIVE POSTIVE SARS-CoV-2 nucleic acids MAY BE PRESENT.   A presumptive positive result was obtained on the submitted specimen  and confirmed on repeat testing.  While 2019 novel coronavirus  (SARS-CoV-2) nucleic acids may be present in the submitted sample  additional confirmatory testing may be necessary for epidemiological  and / or clinical management purposes  to differentiate between   SARS-CoV-2 and other Sarbecovirus currently known to infect humans.  If clinically indicated additional testing with an alternate test  methodology [phone]) is  advised. The SARS-CoV-2 RNA is generally  detectable in upper and lower respiratory specimens during the acute  phase of infection. The expected result is Negative. Fact Sheet for Patients:  StrictlyIdeas.no Fact Sheet for Healthcare Providers: BankingDealers.co.za This test is not yet approved or cleared by the [place] FDA and has been authorized for detection and/or diagnosis of SARS-CoV-2 by FDA under an Emergency Use Authorization (EUA).  This EUA will remain in effect (meaning this test can be used) for the duration of the COVID-19 declaration under Section 564(b)(1) of the Act, 21 U.S.C. section 360bbb-3(b)(1), unless the authorization is terminated or revoked sooner. Performed at [facility], [place] [address], [place], [place] [address]   Respiratory Panel by PCR     Status: None   Collection Time: 11/15/18  7:00 PM  Result Value Ref Range Status   Adenovirus NOT DETECTED NOT DETECTED Final   Coronavirus 229E NOT DETECTED NOT DETECTED Final    Comment: (NOTE) The Coronavirus on the Respiratory Panel, DOES NOT test for the novel  Coronavirus (2019 nCoV)    Coronavirus HKU1 NOT DETECTED NOT DETECTED Final   Coronavirus NL63 NOT DETECTED NOT DETECTED Final   Coronavirus OC43 NOT DETECTED NOT DETECTED Final   Metapneumovirus NOT DETECTED NOT DETECTED Final   Rhinovirus / Enterovirus NOT DETECTED NOT DETECTED Final   Influenza A NOT DETECTED NOT DETECTED Final   Influenza B NOT DETECTED NOT DETECTED Final   Parainfluenza Virus 1 NOT DETECTED NOT DETECTED Final   Parainfluenza Virus 2 NOT DETECTED NOT DETECTED Final   Parainfluenza Virus 3 NOT DETECTED NOT DETECTED Final   Parainfluenza Virus 4 NOT DETECTED NOT DETECTED Final   Respiratory  Syncytial Virus NOT DETECTED NOT DETECTED Final   Bordetella pertussis NOT DETECTED NOT DETECTED Final   Chlamydophila pneumoniae NOT DETECTED NOT DETECTED Final   Mycoplasma pneumoniae  NOT DETECTED NOT DETECTED Final    Comment: Performed at [facility], [place] [address] [place], [place] [address]  MRSA PCR Screening     Status: Abnormal   Collection Time: 11/16/18  5:00 AM  Result Value Ref Range Status   MRSA by PCR POSITIVE (A) NEGATIVE Final    Comment:        The GeneXpert MRSA Assay (FDA approved for NASAL specimens only), is one component of a comprehensive MRSA colonization surveillance program. It is not intended to diagnose MRSA infection nor to guide or monitor treatment for MRSA infections. RESULT CALLED TO, READ BACK BY AND VERIFIED WITH: Glennon Mac RN AT 1046 ON 11/16/2018 BY S.VANHOORNE Performed at [facility], [place] [address], [place], [place] [address]      Scheduled Meds: . amLODipine  10 mg Oral Daily  . atorvastatin  20 mg Oral QHS  . escitalopram  5 mg Oral Daily  . famotidine  20 mg Oral Daily  . gabapentin  100 mg Oral TID  . insulin aspart  0-20 Units Subcutaneous TID WC  . insulin aspart  0-5 Units Subcutaneous QHS  . insulin aspart  5 Units Subcutaneous TID WC  . insulin glargine  15 Units Subcutaneous BID  . latanoprost  1 drop Both Eyes QHS  . loratadine  10 mg Oral Daily  . magnesium oxide  400 mg Oral Daily  . mouth rinse  15 mL Mouth Rinse BID  . Melatonin  5 mg Oral QHS  . metoprolol tartrate  25 mg Oral Daily  . potassium chloride SA  20 mEq Oral Daily  . Rivaroxaban  15 mg Oral Q breakfast  . torsemide  40 mg Oral Daily  . vitamin B-12  1,000 mcg Oral Daily  . vitamin C  500 mg Oral Daily  . zinc sulfate  220 mg Oral Daily     LOS: 8 days   Cherene Altes, MD [facility] Office  [phone] Pager - Text Page per Amion  If 7PM-7AM, please contact night-coverage per Amion 11/23/2018, 9:14 AM

## 2018-11-23 NOTE — Progress Notes (Signed)
Patient remains stable. No changes.

## 2018-11-23 NOTE — Progress Notes (Signed)
  Speech Language Pathology Treatment: Dysphagia  Patient Details Name: Reginald [name] MRN: [id number] DOB: 1936-04-12 Today's Date: 11/23/2018 Time: 9767-3419 SLP Time Calculation (min) (ACUTE ONLY): 17 min  Assessment / Plan / Recommendation Clinical Impression  Pt doing well with eating/swallowing with no further difficulty; no s/s of aspiration.  Lung sounds clear and respiratory status does not pose issues with swallow/respiratory dyssynchrony.  Pt consumed 6 oz water, crackers without deficit.  No further SLP f/u needed.  Continue regular solids, thin liquids.   HPI HPI: 83 y.o. male with medical history significant for atrial fibrillation, type 2 diabetes mellitus, essential hypertension, HLD, morbid obesity, chronic venous stasis with chronic ulcers who presented to [facility] ED from SNF with reported shortness of breath and fever.  Per EMS report, noted to be saturating in the mid 80s, initially requiring nonrebreather.  Patient was noted to be febrile.  Chest x-ray showed bibasilar opacities.  Patient was hospitalized for further management. Found to have Acute respiratory failure with hypoxia secondary to COVID-19. Per MD order pt has been coughing with liquids.       SLP Plan  Discharge SLP treatment due to (comment) - goals met       Recommendations  Diet recommendations: Regular;Thin liquid Liquids provided via: Cup;Straw Medication Administration: Whole meds with liquid Supervision: Patient able to self feed                Oral Care Recommendations: Oral care BID Follow up Recommendations: Skilled Nursing facility SLP Visit Diagnosis: Dysphagia, unspecified (R13.10) Plan: Discharge SLP treatment due to (comment)       GO                Juan Quam Laurice 11/23/2018, 10:57 AM  Reginald [name] L. Tivis Ringer, [place] Office number [phone]

## 2018-11-23 NOTE — Progress Notes (Signed)
Inpatient Diabetes Program Recommendations  AACE/ADA: New Consensus Statement on Inpatient Glycemic Control (2015)  Target Ranges:  Prepandial:   less than 140 mg/dL      Peak postprandial:   less than 180 mg/dL (1-2 hours)      Critically ill patients:  140 - 180 mg/dL   Results for Reginald [name], Reginald [name] (MRN [id number]) as of 11/23/2018 12:13  Ref. Range 11/22/2018 07:38 11/22/2018 12:01 11/22/2018 16:20 11/22/2018 21:11  Glucose-Capillary Latest Ref Range: 70 - 99 mg/dL 224 (H)  13 units NOVOLOG  207 (H)  13 units NOVOLOG +  15 units LANTUS  161 (H)  9 units NOVOLOG  166 (H)     15 units LANTUS   Results for Reginald [name], Reginald [name] (MRN [id number]) as of 11/23/2018 12:13  Ref. Range 11/23/2018 09:01 11/23/2018 11:27  Glucose-Capillary Latest Ref Range: 70 - 99 mg/dL 187 (H)  9 units NOVOLOG  252 (H)    Home DM Meds: Tradjenta 5 mg daily       Glipizide 5 mg Daily       Trulicity 0.5 mg Qweek   Current Orders: Lantus 15 units BID         Novolog Resistant Correction Scale/ SSI (0-20 units) TID AC + HS       Novolog 5 units TID with meals     MD- Please consider the following in-hospital insulin adjustments:  Increase Novolog Meal Coverage to: Novolog 8 units TID with meals    --Will follow patient during hospitalization--  Wyn Quaker RN, MSN, CDE Diabetes Coordinator Inpatient Glycemic Control Team Team Pager: [phone] (8a-5p)

## 2018-11-24 LAB — GLUCOSE, CAPILLARY
Glucose-Capillary: 122 mg/dL — ABNORMAL HIGH (ref 70–99)
Glucose-Capillary: 132 mg/dL — ABNORMAL HIGH (ref 70–99)
Glucose-Capillary: 186 mg/dL — ABNORMAL HIGH (ref 70–99)
Glucose-Capillary: 121 mg/dL — ABNORMAL HIGH (ref 70–99)

## 2018-11-24 LAB — BASIC METABOLIC PANEL
Anion gap: 12 (ref 5–15)
BUN: 93 mg/dL — ABNORMAL HIGH (ref 8–23)
CO2: 32 mmol/L (ref 22–32)
Calcium: 9.7 mg/dL (ref 8.9–10.3)
Chloride: 104 mmol/L (ref 98–111)
Creatinine, Ser: 2.54 mg/dL — ABNORMAL HIGH (ref 0.61–1.24)
GFR calc Af Amer: 27 mL/min — ABNORMAL LOW (ref >60)
GFR calc non Af Amer: 23 mL/min — ABNORMAL LOW (ref >60)
Glucose, Bld: 101 mg/dL — ABNORMAL HIGH (ref 70–99)
Potassium: 3.5 mmol/L (ref 3.5–5.1)
Sodium: 148 mmol/L — ABNORMAL HIGH (ref 135–145)

## 2018-11-24 LAB — CBC
HCT: 41 % (ref 39.0–52.0)
Hemoglobin: 13 g/dL (ref 13.0–17.0)
MCH: 27.1 pg (ref 26.0–34.0)
MCHC: 31.7 g/dL (ref 30.0–36.0)
MCV: 85.6 fL (ref 80.0–100.0)
Platelets: 266 10*3/uL (ref 150–400)
RBC: 4.79 MIL/uL (ref 4.22–5.81)
RDW: 17.3 % — ABNORMAL HIGH (ref 11.5–15.5)
WBC: 9.8 10*3/uL (ref 4.0–10.5)
nRBC: 0.9 % — ABNORMAL HIGH (ref 0.0–0.2)

## 2018-11-24 MED ORDER — DEXTROSE 5 % IV BOLUS
250.0000 mL | Freq: Once | INTRAVENOUS | Status: AC
  Administered 2018-11-24: 250 mL via INTRAVENOUS

## 2018-11-24 MED ORDER — INSULIN ASPART 100 UNIT/ML ~~LOC~~ SOLN
5.0000 [IU] | Freq: Three times a day (TID) | SUBCUTANEOUS | Status: DC
  Administered 2018-11-24 – 2018-11-27 (×9): 5 [IU] via SUBCUTANEOUS

## 2018-11-24 MED ORDER — POTASSIUM CHLORIDE CRYS ER 20 MEQ PO TBCR
40.0000 meq | EXTENDED_RELEASE_TABLET | Freq: Every day | ORAL | Status: DC
  Administered 2018-11-24 – 2018-11-27 (×4): 40 meq via ORAL
  Filled 2018-11-24 (×4): qty 2

## 2018-11-24 MED ORDER — SODIUM CHLORIDE 0.9 % IV BOLUS
250.0000 mL | Freq: Once | INTRAVENOUS | Status: AC
  Administered 2018-11-24: 250 mL via INTRAVENOUS

## 2018-11-24 NOTE — Progress Notes (Signed)
Reginald [name]  Reginald [name]  WLS:[id number] DOB: 06/20/1936 DOA: 11/15/2018 PCP: Reginald Spanish, MD    Brief Narrative:  83yo w/ a hx of atrial fibrillation, DM2, HTN, HLD, morbid obesity, and chronic venous stasis with chronic ulcers who presented to [facility] ED 5/7 from his SNF with shortness of breath and fever. Per EMS report, noted to be saturating in the mid 80s, initially requiring nonrebreather. Patient was noted to be febrile. CXR showed bibasilar opacities.    Significant Events: 5/7 admit   COVID-19 specific Treatment: Plaquenil  Actemra - #1 5/8 - #2 5/9 Prednisone 5/13  Subjective: Alert but remains confused. No focal neurologic deficits. Can tell me he is in the hospital, but not which one or why. Denies any complaints whatsoever.   Assessment & Plan:  Acute Hypoxic Resp Failure - Covid 19 no indication for further experimental tx at this time - clinically stabilizing - mobilize    Chronic Persistent atrial fibrillation Continue metoprolol - chronically anticoagulated with Xarelto - rate controlled  Acute on chronic kidney disease stage IV Baseline creatinine not known but last creatinine in our system from 2018 was 1.27 -creatinine up signif today - stop diuretic - follow trend - delay d/c until clear renal fxn is stable   Recent Labs  Lab 11/20/18 0437 11/21/18 0500 11/22/18 0422 11/23/18 0548 11/24/18 0311  CREATININE 2.12* 2.08* 1.89* 2.23* 2.54*    HTN No change in tx plan today   Chronic diastolic CHF TTE 8115 showed normal systolic function at 72-62% - on torsemide at home which is currently on hold due to AKI  DM2 monitor CBG without change today  History of peripheral neuropathy Continue gabapentin  History of glaucoma Continue eyedrops  Hyperlipidemia Continue home medications.  Depression Continue Lexapro  Morbid obesity - Body mass index is 44.95 kg/m.   DVT prophylaxis: Xarelto  Code  Status: FULL CODE Family Communication:  Disposition Plan: to return to [name] 5[facility]17 if renal function improved   Consultants:  none  Antimicrobials:  Cefepime 5/7 > 5/13 Vanc 5/7 > 5/9  Objective: Blood pressure (!) 138/99, pulse 75, temperature 98 F (36.7 C), temperature source Axillary, resp. rate 16, height 5\' 10"  (1.778 m), weight (!) 142.1 kg, SpO2 92 %.  Intake/Output Summary (Last 24 hours) at 11/24/2018 1622 Last data filed at 11/24/2018 1233 Gross per 24 hour  Intake 399.72 ml  Output 1250 ml  Net -850.28 ml   Filed Weights   11/21/18 0424 11/23/18 0500 11/24/18 0500  Weight: (!) 150.6 kg (!) 142.4 kg (!) 142.1 kg    Examination: General: No acute respiratory distress- alert but confused  Lungs: no focal crackles - no wheezing Cardiovascular: Irregularly irregular - rate controlled  Abdomen: NT/ND, soft, BS+ Extremities: 1+ B LE edema w/o singif change   CBC: Recent Labs  Lab 11/19/18 0500 11/20/18 0437 11/22/18 0422 11/23/18 0548 11/24/18 0311  WBC 8.4 11.6* 19.2* 15.2* 9.8  NEUTROABS 7.5 10.6* 17.2*  --   --   HGB 12.0* 12.2* 12.8* 13.1 13.0  HCT 38.8* 39.7 41.2 42.9 41.0  MCV 87.0 85.6 84.3 85.1 85.6  PLT 209 249 289 298 035   Basic Metabolic Panel: Recent Labs  Lab 11/18/18 0400 11/19/18 0500 11/20/18 0437  11/22/18 0422 11/23/18 0548 11/24/18 0311  NA 138 137 137   < > 141 144 148*  K 4.3 4.3 4.1   < > 3.8 3.8 3.5  CL 101 100 101   < >  102 100 104  CO2 25 27 25    < > 27 31 32  GLUCOSE 322* 380* 344*   < > 245* 209* 101*  BUN 41* 56* 70*   < > 79* 93* 93*  CREATININE 1.83* 2.20* 2.12*   < > 1.89* 2.23* 2.54*  CALCIUM 8.2* 8.5* 8.7*   < > 9.6 9.8 9.7  MG 2.2 2.3 2.3  --   --   --   --   PHOS 2.8 2.9 3.2  --   --   --   --    < > = values in this interval not displayed.   GFR: Estimated Creatinine Clearance: 33 mL/min (A) (by C-G formula based on SCr of 2.54 mg/dL (H)).  Liver Function Tests: Recent Labs  Lab 11/18/18 0400 11/19/18  0500 11/20/18 0437 11/22/18 0422  AST 49* 34 79* 55*  ALT 34 33 92* 112*  ALKPHOS 57 55 54 62  BILITOT 0.8 0.5 0.7 1.0  PROT 6.3* 6.0* 6.1* 6.3*  ALBUMIN 2.9* 2.7* 2.9* 3.1*    HbA1C: Hgb A1c MFr Bld  Date/Time Value Ref Range Status  11/16/2018 07:45 AM 8.3 (H) 4.8 - 5.6 % Final    Comment:    (NOTE) Pre diabetes:          5.7%-6.4% Diabetes:              >6.4% Glycemic control for   <7.0% adults with diabetes   10/27/2016 02:23 PM 6.9 (H) 4.8 - 5.6 % Final    Comment:    (NOTE)         Pre-diabetes: 5.7 - 6.4         Diabetes: >6.4         Glycemic control for adults with diabetes: <7.0     CBG: Recent Labs  Lab 11/23/18 1709 11/23/18 2006 11/24/18 0553 11/24/18 0838 11/24/18 1130  GLUCAP 160* 145* 122* 132* 186*    Recent Results (from the past 240 hour(s))  Blood Culture (routine x 2)     Status: None   Collection Time: 11/15/18  6:15 PM  Result Value Ref Range Status   Specimen Description   Final    BLOOD RIGHT ARM Performed at [facility], [address] [place], [place] [address]    Special Requests   Final    BOTTLES DRAWN AEROBIC AND ANAEROBIC Blood Culture adequate volume Performed at [place] [address], [place], [place] [address]    Culture   Final    NO GROWTH 5 DAYS Performed at [facility], [place] [name] [address] [place], [place] [address]    Report Status 11/20/2018 FINAL  Final  Blood Culture (routine x 2)     Status: None   Collection Time: 11/15/18  6:20 PM  Result Value Ref Range Status   Specimen Description   Final    BLOOD LEFT ARM Performed at [facility], [place] [address], [place], [place] [address]    Special Requests   Final    BOTTLES DRAWN AEROBIC AND ANAEROBIC Blood Culture adequate volume Performed at [place] [address], [place], [place] [address]    Culture   Final    NO GROWTH 5 DAYS Performed at [facility], [place] [address]  [place], [place] [address]    Report Status 11/20/2018 FINAL  Final  SARS Coronavirus 2 [name] order, Performed in [name] hospital lab)  Status: Abnormal   Collection Time: 11/15/18  7:00 PM  Result Value Ref Range Status   SARS Coronavirus 2 POSITIVE (A) NEGATIVE Final    Comment: RESULT CALLED TO, READ BACK BY AND VERIFIED WITH: Susa Day RN 2228 11/15/18 A NAVARRO (NOTE) If result is NEGATIVE SARS-CoV-2 target nucleic acids are NOT DETECTED. The SARS-CoV-2 RNA is generally detectable in upper and lower  respiratory specimens during the acute phase of infection. The lowest  concentration of SARS-CoV-2 viral copies this assay can detect is 250  copies / mL. A negative result does not preclude SARS-CoV-2 infection  and should not be used as the sole basis for treatment or other  patient management decisions.  A negative result may occur with  improper specimen collection / handling, submission of specimen other  than nasopharyngeal swab, presence of viral mutation(s) within the  areas targeted by this assay, and inadequate number of viral copies  (<250 copies / mL). A negative result must be combined with clinical  observations, patient history, and epidemiological information. If result is POSITIVE SARS-CoV-2 target nucleic acids are DETECTED. T he SARS-CoV-2 RNA is generally detectable in upper and lower  respiratory specimens during the acute phase of infection.  Positive  results are indicative of active infection with SARS-CoV-2.  Clinical  correlation with patient history and other diagnostic information is  necessary to determine patient infection status.  Positive results do  not rule out bacterial infection or co-infection with other viruses. If result is PRESUMPTIVE POSTIVE SARS-CoV-2 nucleic acids MAY BE PRESENT.   A presumptive positive result was obtained on the submitted specimen  and confirmed on repeat testing.  While 2019 novel coronavirus  (SARS-CoV-2)  nucleic acids may be present in the submitted sample  additional confirmatory testing may be necessary for epidemiological  and / or clinical management purposes  to differentiate between  SARS-CoV-2 and other Sarbecovirus currently known to infect humans.  If clinically indicated additional testing with an alternate test  methodology [phone]) is  advised. The SARS-CoV-2 RNA is generally  detectable in upper and lower respiratory specimens during the acute  phase of infection. The expected result is Negative. Fact Sheet for Patients:  StrictlyIdeas.no Fact Sheet for Healthcare Providers: BankingDealers.co.za This test is not yet approved or cleared by the [place] FDA and has been authorized for detection and/or diagnosis of SARS-CoV-2 by FDA under an Emergency Use Authorization (EUA).  This EUA will remain in effect (meaning this test can be used) for the duration of the COVID-19 declaration under Section 564(b)(1) of the Act, 21 U.S.C. section 360bbb-3(b)(1), unless the authorization is terminated or revoked sooner. Performed at [name], [place] [address], [place], [place] [address]   Respiratory Panel by PCR     Status: None   Collection Time: 11/15/18  7:00 PM  Result Value Ref Range Status   Adenovirus NOT DETECTED NOT DETECTED Final   Coronavirus 229E NOT DETECTED NOT DETECTED Final    Comment: (NOTE) The Coronavirus on the Respiratory Panel, DOES NOT test for the novel  Coronavirus (2019 nCoV)    Coronavirus HKU1 NOT DETECTED NOT DETECTED Final   Coronavirus NL63 NOT DETECTED NOT DETECTED Final   Coronavirus OC43 NOT DETECTED NOT DETECTED Final   Metapneumovirus NOT DETECTED NOT DETECTED Final   Rhinovirus / Enterovirus NOT DETECTED NOT DETECTED Final   Influenza A NOT DETECTED NOT DETECTED Final   Influenza B NOT DETECTED NOT DETECTED Final   Parainfluenza Virus 1 NOT DETECTED  NOT DETECTED Final    Parainfluenza Virus 2 NOT DETECTED NOT DETECTED Final   Parainfluenza Virus 3 NOT DETECTED NOT DETECTED Final   Parainfluenza Virus 4 NOT DETECTED NOT DETECTED Final   Respiratory Syncytial Virus NOT DETECTED NOT DETECTED Final   Bordetella pertussis NOT DETECTED NOT DETECTED Final   Chlamydophila pneumoniae NOT DETECTED NOT DETECTED Final   Mycoplasma pneumoniae NOT DETECTED NOT DETECTED Final    Comment: Performed at [facility], [place] [address], [place], [place] [address]  MRSA PCR Screening     Status: Abnormal   Collection Time: 11/16/18  5:00 AM  Result Value Ref Range Status   MRSA by PCR POSITIVE (A) NEGATIVE Final    Comment:        The GeneXpert MRSA Assay (FDA approved for NASAL specimens only), is one component of a comprehensive MRSA colonization surveillance program. It is not intended to diagnose MRSA infection nor to guide or monitor treatment for MRSA infections. RESULT CALLED TO, READ BACK BY AND VERIFIED WITH: Glennon Mac RN AT 1046 ON 11/16/2018 BY S.VANHOORNE Performed at [facility], [place] [address], [place], [place] [address]      Scheduled Meds: . amLODipine  10 mg Oral Daily  . atorvastatin  20 mg Oral QHS  . escitalopram  5 mg Oral Daily  . famotidine  20 mg Oral Daily  . gabapentin  100 mg Oral TID  . insulin aspart  0-20 Units Subcutaneous TID WC  . insulin aspart  0-5 Units Subcutaneous QHS  . insulin aspart  5 Units Subcutaneous TID WC  . insulin glargine  15 Units Subcutaneous BID  . latanoprost  1 drop Both Eyes QHS  . loratadine  10 mg Oral Daily  . magnesium oxide  400 mg Oral Daily  . mouth rinse  15 mL Mouth Rinse BID  . Melatonin  5 mg Oral QHS  . metoprolol tartrate  25 mg Oral Daily  . potassium chloride SA  40 mEq Oral Daily  . Rivaroxaban  15 mg Oral Q breakfast  . vitamin B-12  1,000 mcg Oral Daily  . vitamin C  500 mg Oral Daily  . zinc sulfate  220 mg Oral Daily     LOS: 9 days   Cherene Altes, MD  [facility] Office  [phone] Pager - Text Page per Amion  If 7PM-7AM, please contact night-coverage per Amion 11/24/2018, 4:22 PM

## 2018-11-25 LAB — C-REACTIVE PROTEIN: CRP: <0.8 mg/dL (ref <1.0)

## 2018-11-25 LAB — COMPREHENSIVE METABOLIC PANEL
ALT: 79 U/L — ABNORMAL HIGH (ref 0–44)
AST: 41 U/L (ref 15–41)
Albumin: 3.1 g/dL — ABNORMAL LOW (ref 3.5–5.0)
Alkaline Phosphatase: 59 U/L (ref 38–126)
Anion gap: 8 (ref 5–15)
BUN: 88 mg/dL — ABNORMAL HIGH (ref 8–23)
CO2: 31 mmol/L (ref 22–32)
Calcium: 9.2 mg/dL (ref 8.9–10.3)
Chloride: 104 mmol/L (ref 98–111)
Creatinine, Ser: 2.21 mg/dL — ABNORMAL HIGH (ref 0.61–1.24)
GFR calc Af Amer: 31 mL/min — ABNORMAL LOW (ref >60)
GFR calc non Af Amer: 27 mL/min — ABNORMAL LOW (ref >60)
Glucose, Bld: 90 mg/dL (ref 70–99)
Potassium: 3.5 mmol/L (ref 3.5–5.1)
Sodium: 143 mmol/L (ref 135–145)
Total Bilirubin: 1.3 mg/dL — ABNORMAL HIGH (ref 0.3–1.2)
Total Protein: 5.7 g/dL — ABNORMAL LOW (ref 6.5–8.1)

## 2018-11-25 LAB — CBC WITH DIFFERENTIAL/PLATELET
Abs Immature Granulocytes: 0.2 10*3/uL — ABNORMAL HIGH (ref 0.00–0.07)
Basophils Absolute: 0 10*3/uL (ref 0.0–0.1)
Basophils Relative: 0 %
Eosinophils Absolute: 0.4 10*3/uL (ref 0.0–0.5)
Eosinophils Relative: 5 %
HCT: 37.3 % — ABNORMAL LOW (ref 39.0–52.0)
Hemoglobin: 11.6 g/dL — ABNORMAL LOW (ref 13.0–17.0)
Immature Granulocytes: 3 %
Lymphocytes Relative: 6 %
Lymphs Abs: 0.5 10*3/uL — ABNORMAL LOW (ref 0.7–4.0)
MCH: 26.7 pg (ref 26.0–34.0)
MCHC: 31.1 g/dL (ref 30.0–36.0)
MCV: 85.9 fL (ref 80.0–100.0)
Monocytes Absolute: 0.4 10*3/uL (ref 0.1–1.0)
Monocytes Relative: 5 %
Neutro Abs: 6.3 10*3/uL (ref 1.7–7.7)
Neutrophils Relative %: 81 %
Platelets: 228 10*3/uL (ref 150–400)
RBC: 4.34 MIL/uL (ref 4.22–5.81)
RDW: 17.5 % — ABNORMAL HIGH (ref 11.5–15.5)
WBC: 7.8 10*3/uL (ref 4.0–10.5)
nRBC: 0.3 % — ABNORMAL HIGH (ref 0.0–0.2)

## 2018-11-25 LAB — GLUCOSE, CAPILLARY
Glucose-Capillary: 100 mg/dL — ABNORMAL HIGH (ref 70–99)
Glucose-Capillary: 180 mg/dL — ABNORMAL HIGH (ref 70–99)
Glucose-Capillary: 196 mg/dL — ABNORMAL HIGH (ref 70–99)
Glucose-Capillary: 205 mg/dL — ABNORMAL HIGH (ref 70–99)

## 2018-11-25 LAB — FERRITIN: Ferritin: 173 ng/mL (ref 24–336)

## 2018-11-25 NOTE — Progress Notes (Signed)
TEAM 1 - Stepdown/ICU TEAM  [facility]inal[facility] [name]  VXB:[id number] DOB: 06/05/1936 DOA: 11/15/2018 PCP: Guadlupe Spanish, MD    Brief Narrative:  83yo w/ a hx of atrial fibrillation, DM2, HTN, HLD, morbid obesity, and chronic venous stasis with chronic ulcers who presented to [facility] ED 5/7 from his SNF with shortness of breath and fever. Per EMS report, noted to be saturating in the mid 80s, initially requiring nonrebreather. Patient was noted to be febrile. CXR showed bibasilar opacities.    Significant Events: 5/7 admit   COVID-19 specific Treatment: Plaquenil  Actemra - #1 5/8 - #2 5/9 Prednisone 5/13  Subjective: Crt, Na, and BUN improved w/ holding of diuretic. O2 sats stable. Orientation more appropriate today, coincident w/ falling BUN. Denies cp, sob, n/v, or abdom pain.   Assessment & Plan:  Acute Hypoxic Resp Failure - Covid 19 no indication for further experimental tx at this time - clinically stabilizing - mobilize - wean to RA as able   Chronic Persistent atrial fibrillation Continue metoprolol - chronically anticoagulated with Xarelto - rate controlled  Acute on chronic kidney disease stage IV Baseline creatinine not known but last creatinine in our system from 2018 was 1.27 -creatinine has improved w/ stopping of diuretic - cont to hold - if crt remains stable in AM pt should be ready for return to SNF   Recent Labs  Lab 11/21/18 0500 11/22/18 0422 11/23/18 0548 11/24/18 0311 11/25/18 0442  CREATININE 2.08* 1.89* 2.23* 2.54* 2.21*    HTN BP controlled   Chronic diastolic CHF TTE 3300 showed normal systolic function at 76-22% - on torsemide at home which is currently on hold due to AKI - some LE edema but no severe volume overload   DM2 CBG reasonably controlled   History of peripheral neuropathy Continue gabapentin  History of glaucoma Continue eyedrops  Hyperlipidemia Continue home medications.  Depression Continue Lexapro   Morbid obesity - Body mass index is 44.92 kg/m.   DVT prophylaxis: Xarelto  Code Status: FULL CODE Family Communication:  Disposition Plan: to return to [facility] 5/18 if renal function improved  Consultants:  none  Antimicrobials:  Cefepime 5/7 > 5/13 Vanc 5/7 > 5/9  Objective: Blood pressure (!) 121/57, pulse 79, temperature 98.1 F (36.7 C), temperature source Oral, resp. rate 15, height 5\' 10"  (1.778 m), weight (!) 142 kg, SpO2 90 %.  Intake/Output Summary (Last 24 hours) at 11/25/2018 0901 Last data filed at 11/25/2018 0855 Gross per 24 hour  Intake 1704.05 ml  Output 1650 ml  Net 54.05 ml   Filed Weights   11/23/18 0500 11/24/18 0500 11/25/18 0500  Weight: (!) 142.4 kg (!) 142.1 kg (!) 142 kg    Examination: General: No acute respiratory distress - more oriented today  Lungs: good air movement th/o - no wheezing  Cardiovascular: Irregularly irregular - rate controlled - no M  Abdomen: NT/ND, soft, BS+ Extremities: 1+ B LE edema which is stable    CBC: Recent Labs  Lab 11/20/18 0437 11/22/18 0422 11/23/18 0548 11/24/18 0311 11/25/18 0442  WBC 11.6* 19.2* 15.2* 9.8 7.8  NEUTROABS 10.6* 17.2*  --   --  6.3  HGB 12.2* 12.8* 13.1 13.0 11.6*  HCT 39.7 41.2 42.9 41.0 37.3*  MCV 85.6 84.3 85.1 85.6 85.9  PLT 249 289 298 266 633   Basic Metabolic Panel: Recent Labs  Lab 11/19/18 0500 11/20/18 0437  11/23/18 0548 11/24/18 0311 11/25/18 0442  NA 137 137   < >  144 148* 143  K 4.3 4.1   < > 3.8 3.5 3.5  CL 100 101   < > 100 104 104  CO2 27 25   < > 31 32 31  GLUCOSE 380* 344*   < > 209* 101* 90  BUN 56* 70*   < > 93* 93* 88*  CREATININE 2.20* 2.12*   < > 2.23* 2.54* 2.21*  CALCIUM 8.5* 8.7*   < > 9.8 9.7 9.2  MG 2.3 2.3  --   --   --   --   PHOS 2.9 3.2  --   --   --   --    < > = values in this interval not displayed.   GFR: Estimated Creatinine Clearance: 37.9 mL/min (A) (by C-G formula based on SCr of 2.21 mg/dL (H)).  Liver Function Tests: Recent  Labs  Lab 11/19/18 0500 11/20/18 0437 11/22/18 0422 11/25/18 0442  AST 34 79* 55* 41  ALT 33 92* 112* 79*  ALKPHOS 55 54 62 59  BILITOT 0.5 0.7 1.0 1.3*  PROT 6.0* 6.1* 6.3* 5.7*  ALBUMIN 2.7* 2.9* 3.1* 3.1*    HbA1C: Hgb A1c MFr Bld  Date/Time Value Ref Range Status  11/16/2018 07:45 AM 8.3 (H) 4.8 - 5.6 % Final    Comment:    (NOTE) Pre diabetes:          5.7%-6.4% Diabetes:              >6.4% Glycemic control for   <7.0% adults with diabetes   10/27/2016 02:23 PM 6.9 (H) 4.8 - 5.6 % Final    Comment:    (NOTE)         Pre-diabetes: 5.7 - 6.4         Diabetes: >6.4         Glycemic control for adults with diabetes: <7.0     CBG: Recent Labs  Lab 11/24/18 0553 11/24/18 0838 11/24/18 1130 11/24/18 1647 11/25/18 0755  GLUCAP 122* 132* 186* 121* 100*    Recent Results (from the past 240 hour(s))  Blood Culture (routine x 2)     Status: None   Collection Time: 11/15/18  6:15 PM  Result Value Ref Range Status   Specimen Description   Final    BLOOD RIGHT ARM Performed at [facility], [place] [address] [place], [place] [address]    Special Requests   Final    BOTTLES DRAWN AEROBIC AND ANAEROBIC Blood Culture adequate volume Performed at [facility] [place] [address], [place], [place] [address]    Culture   Final    NO GROWTH 5 DAYS Performed at [facility], [place] [address] [place], [place] [address]    Report Status 11/20/2018 FINAL  Final  Blood Culture (routine x 2)     Status: None   Collection Time: 11/15/18  6:20 PM  Result Value Ref Range Status   Specimen Description   Final    BLOOD LEFT ARM Performed at [facility], [place] [address], [place], [place] [address]    Special Requests   Final    BOTTLES DRAWN AEROBIC AND ANAEROBIC Blood Culture adequate volume Performed at [place] [address], [place], [place] [address]    Culture   Final    NO GROWTH 5 DAYS Performed at [facility], [place] [address], [place], [place] [address]    Report Status 11/20/2018 FINAL  Final  SARS Coronavirus 2 [facility] order, Performed in [facility] hospital lab)     Status: Abnormal   Collection Time: 11/15/18  7:00 PM  Result Value Ref Range Status   SARS Coronavirus 2 POSITIVE (A) NEGATIVE Final    Comment: RESULT CALLED TO, READ BACK BY AND VERIFIED WITH: Susa Day RN 2228 11/15/18 A NAVARRO (NOTE) If result is NEGATIVE SARS-CoV-2 target nucleic acids are NOT DETECTED. The SARS-CoV-2 RNA is generally detectable in upper and lower  respiratory specimens during the acute phase of infection. The lowest  concentration of SARS-CoV-2 viral copies this assay can detect is 250  copies / mL. A negative result does not preclude SARS-CoV-2 infection  and should not be used as the sole basis for treatment or other  patient management decisions.  A negative result may occur with  improper specimen collection / handling, submission of specimen other  than nasopharyngeal swab, presence of viral mutation(s) within the  areas targeted by this assay, and inadequate number of viral copies  (<250 copies / mL). A negative result must be combined with clinical  observations, patient history, and epidemiological information. If result is POSITIVE SARS-CoV-2 target nucleic acids are DETECTED. T he SARS-CoV-2 RNA is generally detectable in upper and lower  respiratory specimens during the acute phase of infection.  Positive  results are indicative of active infection with SARS-CoV-2.  Clinical  correlation with patient history and other diagnostic information is  necessary to determine patient infection status.  Positive results do  not rule out bacterial infection or co-infection with other viruses. If result is PRESUMPTIVE POSTIVE SARS-CoV-2 nucleic acids MAY BE PRESENT.   A presumptive positive result was obtained on the submitted specimen  and confirmed on repeat testing.  While 2019 novel  coronavirus  (SARS-CoV-2) nucleic acids may be present in the submitted sample  additional confirmatory testing may be necessary for epidemiological  and / or clinical management purposes  to differentiate between  SARS-CoV-2 and other Sarbecovirus currently known to infect humans.  If clinically indicated additional testing with an alternate test  methodology [phone]) is  advised. The SARS-CoV-2 RNA is generally  detectable in upper and lower respiratory specimens during the acute  phase of infection. The expected result is Negative. Fact Sheet for Patients:  StrictlyIdeas.no Fact Sheet for Healthcare Providers: BankingDealers.co.za This test is not yet approved or cleared by the [place] FDA and has been authorized for detection and/or diagnosis of SARS-CoV-2 by FDA under an Emergency Use Authorization (EUA).  This EUA will remain in effect (meaning this test can be used) for the duration of the COVID-19 declaration under Section 564(b)(1) of the Act, 21 U.S.C. section 360bbb-3(b)(1), unless the authorization is terminated or revoked sooner. Performed at [facility], [place] [address] [place], [place] [address]   Respiratory Panel by PCR     Status: None   Collection Time: 11/15/18  7:00 PM  Result Value Ref Range Status   Adenovirus NOT DETECTED NOT DETECTED Final   Coronavirus 229E NOT DETECTED NOT DETECTED Final    Comment: (NOTE) The Coronavirus on the Respiratory Panel, DOES NOT test for the novel  Coronavirus (2019 nCoV)    Coronavirus HKU1 NOT DETECTED NOT DETECTED Final   Coronavirus NL63 NOT DETECTED NOT DETECTED Final   Coronavirus OC43 NOT DETECTED NOT DETECTED Final   Metapneumovirus NOT DETECTED NOT DETECTED Final   Rhinovirus / Enterovirus NOT DETECTED NOT DETECTED Final   Influenza A NOT DETECTED NOT DETECTED Final  Influenza B NOT DETECTED NOT DETECTED Final   Parainfluenza Virus 1 NOT  DETECTED NOT DETECTED Final   Parainfluenza Virus 2 NOT DETECTED NOT DETECTED Final   Parainfluenza Virus 3 NOT DETECTED NOT DETECTED Final   Parainfluenza Virus 4 NOT DETECTED NOT DETECTED Final   Respiratory Syncytial Virus NOT DETECTED NOT DETECTED Final   Bordetella pertussis NOT DETECTED NOT DETECTED Final   Chlamydophila pneumoniae NOT DETECTED NOT DETECTED Final   Mycoplasma pneumoniae NOT DETECTED NOT DETECTED Final    Comment: Performed at [facility], [place] [address] [place], [place] [address]  MRSA PCR Screening     Status: Abnormal   Collection Time: 11/16/18  5:00 AM  Result Value Ref Range Status   MRSA by PCR POSITIVE (A) NEGATIVE Final    Comment:        The GeneXpert MRSA Assay (FDA approved for NASAL specimens only), is one component of a comprehensive MRSA colonization surveillance program. It is not intended to diagnose MRSA infection nor to guide or monitor treatment for MRSA infections. RESULT CALLED TO, READ BACK BY AND VERIFIED WITH: Glennon Mac RN AT 1046 ON 11/16/2018 BY S.VANHOORNE Performed at [facility], [place] [address] [place],  [address]      Scheduled Meds: . amLODipine  10 mg Oral Daily  . atorvastatin  20 mg Oral QHS  . escitalopram  5 mg Oral Daily  . famotidine  20 mg Oral Daily  . gabapentin  100 mg Oral TID  . insulin aspart  0-20 Units Subcutaneous TID WC  . insulin aspart  0-5 Units Subcutaneous QHS  . insulin aspart  5 Units Subcutaneous TID WC  . insulin glargine  15 Units Subcutaneous BID  . latanoprost  1 drop Both Eyes QHS  . loratadine  10 mg Oral Daily  . magnesium oxide  400 mg Oral Daily  . mouth rinse  15 mL Mouth Rinse BID  . Melatonin  5 mg Oral QHS  . metoprolol tartrate  25 mg Oral Daily  . potassium chloride SA  40 mEq Oral Daily  . Rivaroxaban  15 mg Oral Q breakfast  . vitamin B-12  1,000 mcg Oral Daily  . vitamin C  500 mg Oral Daily  . zinc sulfate  220 mg Oral Daily     LOS: 10  days   Cherene Altes, MD [facility] Office  [phone] Pager - Text Page per Amion  If 7PM-7AM, please contact night-coverage per Amion 11/25/2018, 9:01 AM

## 2018-11-25 NOTE — Discharge Instructions (Signed)

## 2018-11-26 DIAGNOSIS — I482 Chronic atrial fibrillation, unspecified: Secondary | ICD-10-CM

## 2018-11-26 DIAGNOSIS — N179 Acute kidney failure, unspecified: Secondary | ICD-10-CM

## 2018-11-26 DIAGNOSIS — N184 Chronic kidney disease, stage 4 (severe): Secondary | ICD-10-CM

## 2018-11-26 DIAGNOSIS — E1322 Other specified diabetes mellitus with diabetic chronic kidney disease: Secondary | ICD-10-CM

## 2018-11-26 DIAGNOSIS — I129 Hypertensive chronic kidney disease with stage 1 through stage 4 chronic kidney disease, or unspecified chronic kidney disease: Secondary | ICD-10-CM

## 2018-11-26 LAB — COMPREHENSIVE METABOLIC PANEL
ALT: 74 U/L — ABNORMAL HIGH (ref 0–44)
AST: 41 U/L (ref 15–41)
Albumin: 3.3 g/dL — ABNORMAL LOW (ref 3.5–5.0)
Alkaline Phosphatase: 60 U/L (ref 38–126)
Anion gap: 8 (ref 5–15)
BUN: 80 mg/dL — ABNORMAL HIGH (ref 8–23)
CO2: 31 mmol/L (ref 22–32)
Calcium: 9.7 mg/dL (ref 8.9–10.3)
Chloride: 103 mmol/L (ref 98–111)
Creatinine, Ser: 2.39 mg/dL — ABNORMAL HIGH (ref 0.61–1.24)
GFR calc Af Amer: 29 mL/min — ABNORMAL LOW (ref >60)
GFR calc non Af Amer: 25 mL/min — ABNORMAL LOW (ref >60)
Glucose, Bld: 157 mg/dL — ABNORMAL HIGH (ref 70–99)
Potassium: 3.8 mmol/L (ref 3.5–5.1)
Sodium: 142 mmol/L (ref 135–145)
Total Bilirubin: 1.3 mg/dL — ABNORMAL HIGH (ref 0.3–1.2)
Total Protein: 6 g/dL — ABNORMAL LOW (ref 6.5–8.1)

## 2018-11-26 LAB — GLUCOSE, CAPILLARY
Glucose-Capillary: 139 mg/dL — ABNORMAL HIGH (ref 70–99)
Glucose-Capillary: 174 mg/dL — ABNORMAL HIGH (ref 70–99)
Glucose-Capillary: 227 mg/dL — ABNORMAL HIGH (ref 70–99)
Glucose-Capillary: 182 mg/dL — ABNORMAL HIGH (ref 70–99)
Glucose-Capillary: 142 mg/dL — ABNORMAL HIGH (ref 70–99)

## 2018-11-26 LAB — CBC
HCT: 39.1 % (ref 39.0–52.0)
Hemoglobin: 11.8 g/dL — ABNORMAL LOW (ref 13.0–17.0)
MCH: 26.5 pg (ref 26.0–34.0)
MCHC: 30.2 g/dL (ref 30.0–36.0)
MCV: 87.9 fL (ref 80.0–100.0)
Platelets: 226 10*3/uL (ref 150–400)
RBC: 4.45 MIL/uL (ref 4.22–5.81)
RDW: 17.9 % — ABNORMAL HIGH (ref 11.5–15.5)
WBC: 5.7 10*3/uL (ref 4.0–10.5)
nRBC: 0.3 % — ABNORMAL HIGH (ref 0.0–0.2)

## 2018-11-26 MED ORDER — SODIUM CHLORIDE 0.9 % IV BOLUS: 500.0000 mL | Freq: Once | INTRAVENOUS | Status: DC

## 2018-11-26 MED ORDER — SODIUM CHLORIDE 0.9 % IV BOLUS
1000.0000 mL | Freq: Once | INTRAVENOUS | Status: AC
  Administered 2018-11-26: 1000 mL via INTRAVENOUS

## 2018-11-26 MED ORDER — TORSEMIDE 20 MG PO TABS: 40.0000 mg | ORAL_TABLET | Freq: Every day | ORAL | Status: DC

## 2018-11-26 NOTE — Progress Notes (Signed)
Per MD patient's Creatine has not improved and will not dc today. CSW will continue to update [place] on potential discharge time frame.   [place], [place]

## 2018-11-26 NOTE — TOC Transition Note (Signed)
Transition of Care [facility]) - CM/SW Discharge Note   Patient Details  Name: Reginald [name] MRN: [id number] Date of Birth: [birth year]  Transition of Care [facility]) CM/SW Contact:  Alberteen Sam, LCSW Phone Number: 11/26/2018, 12:29 PM   Clinical Narrative:     Patient will DC to: [facility] Anticipated DC date: 11/26/2018 Family notified: [name] Transport by: Corey Harold  Per MD patient ready for DC to [place]. RN, patient, patient's family, and facility notified of DC. Discharge Summary sent to facility. RN given number for report [phone] Room 704P . DC packet on chart. Ambulance transport requested for patient for 3:00 pm per nurse request. [place] signing off.  [place], [place]   Final next level of care: Skilled Nursing Facility Barriers to Discharge: No Barriers Identified   Patient Goals and CMS Choice   CMS Medicare.gov Compare Post Acute Care list provided to:: Patient Represenative (must comment)([place]) Choice offered to / list presented to : Adult Children([place])  Discharge Placement PASRR number recieved: 11/16/18            Patient [facility] at: [facility] Patient to be transferred to facility by: [facility] Name of family member notified: [name] Patient and family notified of of transfer: 11/26/18  Discharge Plan and Services   Discharge Planning Services: NA Post Acute Care Choice: [place]          DME Arranged: N/A DME Agency: NA       HH Arranged: NA HH Agency: NA        Social Determinants of Health (SDOH) Interventions     Readmission Risk Interventions No flowsheet data found.

## 2018-11-26 NOTE — Progress Notes (Signed)
Physical Therapy Treatment Patient Details Name: Reginald [name] MRN: [id number] DOB: 1935/09/23 Today's Date: 11/26/2018    History of Present Illness Pt adm with SOB and fever. Pt with PNA and COVID 19 positiive. PMH - morbid obesity, ckd, dm, chronic venous stasis, copd, chf, MI, afib, lumbar fusion    PT Comments    The patient is  Slow to mobilize. Today , assisted to recliner using RW and 1 assist. Patient declined to ambu;ate at this time, with encouragement. Continue PT for mobility. Patient on 3 L [place], saturation > 90%, BP 121/74,   Follow Up Recommendations  SNF     Equipment Recommendations  None recommended by PT    Recommendations for Other Services       Precautions / Restrictions Precautions Precautions: Fall Precaution Comments: monitor O2 sats, states legs give out, has been  dropping sats more, needs Oxygen for ambulation now    Mobility  Bed Mobility Overal bed mobility: Needs Assistance Bed Mobility: Supine to Sit     Supine to sit: Mod assist;HOB elevated     General bed mobility comments: pt. moved legs to bed edge, assisted with  trunk, pulling up to stand.  Transfers Overall transfer level: Needs assistance Equipment used: Rolling walker (2 wheeled) Transfers: Sit to/from Stand Sit to Stand: Min assist;From elevated surface         General transfer comment: patient stood from raised bed without extra assistance  Ambulation/Gait Ambulation/Gait assistance: Mod assist Gait Distance (Feet): 5 Feet Assistive device: Rolling walker (2 wheeled) Gait Pattern/deviations: Step-through pattern;Trunk flexed     General Gait Details: pt. on 3 L. Says > 90% , HR max at 113. RR up to 30's after sitting down.   Stairs             Wheelchair Mobility    Modified Rankin (Stroke Patients Only)       Balance Overall balance assessment: Needs assistance Sitting-balance support: No upper extremity supported;Feet supported Sitting  balance-Leahy Scale: Fair     Standing balance support: During functional activity;Bilateral upper extremity supported Standing balance-Leahy Scale: Poor Standing balance comment: reliant on UEs                            Cognition Arousal/Alertness: Awake/alert Behavior During Therapy: Flat affect                                 Problem Solving: Slow processing;Decreased initiation;Difficulty sequencing;Requires verbal cues;Requires tactile cues        Exercises      General Comments        Pertinent Vitals/Pain Pain Assessment: No/denies pain    Home Living                      Prior Function            PT Goals (current goals can now be found in the care plan section) Progress towards PT goals: Not progressing toward goals - comment(has been  less mobie and requires O2 now.)    Frequency    Min 3X/week      PT Plan Current plan remains appropriate;Frequency needs to be updated    Co-evaluation              AM-PAC PT "6 Clicks" Mobility   Outcome Measure  Help needed turning from your back  to your side while in a flat bed without using bedrails?: A Lot Help needed moving from lying on your back to sitting on the side of a flat bed without using bedrails?: A Lot Help needed moving to and from a bed to a chair (including a wheelchair)?: A Lot Help needed standing up from a chair using your arms (e.g., wheelchair or bedside chair)?: A Lot Help needed to walk in hospital room?: Total Help needed climbing 3-5 steps with a railing? : Total 6 Click Score: 10    End of Session Equipment Utilized During Treatment: Gait belt Activity Tolerance: Patient limited by fatigue Patient left: in chair;with call bell/phone within reach;with chair alarm set Nurse Communication: Mobility status PT Visit Diagnosis: Unsteadiness on feet (R26.81)     Time: 0962-8366 PT Time Calculation (min) (ACUTE ONLY): 27 min  Charges:   $Therapeutic Activity: 23-37 mins                      Tresa Endo PT Acute Rehabilitation Services Pager [phone] Office [place], [place] 11/26/2018, 12:15 PM

## 2018-11-26 NOTE — Discharge Summary (Addendum)
Physician Discharge Summary  [name] YQI:[id number] DOB: 11/09/1935 DOA: 11/15/2018  PCP: Guadlupe Spanish, MD  Admit date: 11/15/2018 Discharge date: 11/27/2018  Admitted From: SNF Disposition:  SNF  Recommendations for Outpatient Follow-up:  1. Follow up with PCP in 1-2 weeks 2. Please obtain BMP/CBC in one week   Home Health:No Equipment/Devices:None  Discharge Condition:Stable CODE STATUS: Full Diet recommendation: Heart Healthy   Brief/Interim Summary: 83 y.o. male past medical history of atrial fibrillation on anticoagulation, essential hypertension morbid obesity chronic venous stasis ulcer presents to the [facility] long ED on 11/15/2018 for shortness of breath and fever  Discharge Diagnoses:  Principal Problem:   HCAP (healthcare-associated pneumonia) Active Problems:   Type 2 diabetes mellitus ([place])   Atrial fibrillation ([place])   Secondary DM with CKD stage 4 and hypertension ([place])   Glaucoma   Diastolic dysfunction with chronic heart failure ([place])   Blisters of multiple sites   Hyperlipidemia   Multiple open wounds of lower leg   Lymphedema   Venous stasis ulcers of both lower extremities ([place])   Acute respiratory disease due to COVID-19 virus   AKI (acute kidney injury) ([place])  Acute respiratory failure with hypoxia due to COVID-19: He completed a day course of Plaquenil.  Patient also completed a 5-day course of IV vancomycin and cefepime. With a 3-day course of steroids. He was also given 2 doses of Actemra.  He was kept on his home dose of diuretics, he was fluid restricted.  As his creatinine started to rise his diuretics were held and his fluids were liberalized. Physical therapy was consulted and recommended skilled nursing facility. He remained in room air in the hospital, but when he was ambulated his saturations dropped to 85%.  He will continue 2 L of oxygen at facility for the next 2 weeks.  Chronic atrial fibrillation: No changes made to his  medication continue metoprolol and Xarelto.  Acute kidney injury on chronic kidney disease stage IV: With a baseline creatinine of 1.8-2.0.  Mild increase in creatinine likely due to diuretic therapy which was held and was hydrated IV and his creatinine started to improve. Hold torsemide for 1 week, will need to have recheck basic metabolic panel on 3/87/5643.  Essential hypertension: No changes made to his medication.  Chronic diastolic heart failure: No changes made to his medication.  Diabetes mellitus type II: Mild increase in his blood glucose likely due to steroids. Continue on his current home meds.  Depression: Continue Lexapro.  Discharge Instructions  Discharge Instructions    MyChart COVID-19 home monitoring program   Complete by:  Nov 26, 2018    Is the patient willing to use the [place] for home monitoring?:  No   Diet - low sodium heart healthy   Complete by:  As directed    Diet - low sodium heart healthy   Complete by:  As directed      Allergies as of 11/27/2018      Reactions   Adhesive [tape] Rash      Medication List    TAKE these medications   acetaminophen 325 MG tablet Commonly known as:  TYLENOL Take 650 mg by mouth every 4 (four) hours as needed for mild pain.   amLODipine 10 MG tablet Commonly known as:  NORVASC Take 10 mg by mouth daily.   atorvastatin 20 MG tablet Commonly known as:  LIPITOR Take 20 mg by mouth at bedtime.   cetirizine 10 MG tablet Commonly known as:  ZYRTEC Take 10 mg by mouth at bedtime.   cholecalciferol 1000 units tablet Commonly known as:  VITAMIN D Take 1,000 Units by mouth daily.   docusate sodium 100 MG capsule Commonly known as:  COLACE Take 100 mg by mouth at bedtime.   escitalopram 5 MG tablet Commonly known as:  LEXAPRO Take 5 mg by mouth daily.   eucerin cream Apply 1 application topically 2 (two) times a day.   gabapentin 100 MG capsule Commonly known as:  NEURONTIN Take 100 mg  by mouth 3 (three) times daily.   glipiZIDE 5 MG 24 hr tablet Commonly known as:  GLUCOTROL XL Take 5 mg by mouth daily with breakfast.   latanoprost 0.005 % ophthalmic solution Commonly known as:  XALATAN Place 1 drop into both eyes at bedtime.   Magnesium 400 MG Tabs Take 400 mg by mouth daily.   Melatonin 5 MG Tabs Take 5 mg by mouth at bedtime.   Menthol (Topical Analgesic) 4.5 % Gel Apply 1 application topically 2 (two) times daily as needed (pain). Apply to forehead and left arm   metoprolol tartrate 25 MG tablet Commonly known as:  LOPRESSOR Take 1 tablet (25 mg total) by mouth 2 (two) times daily. What changed:  when to take this   nitroGLYCERIN 0.4 MG SL tablet Commonly known as:  NITROSTAT Place 0.4 mg under the tongue every 5 (five) minutes as needed for chest pain.   Potassium Chloride ER 20 MEQ Tbcr Take 20 mEq by mouth daily.   silver sulfADIAZINE 1 % cream Commonly known as:  SILVADENE Apply 1 application topically every other day. Apply to bilateral lower legs prior to re-wrapping   torsemide 20 MG tablet Commonly known as:  DEMADEX Take 2 tablets (40 mg total) by mouth daily. Start taking on:  Dec 04, 2018 What changed:  These instructions start on Dec 04, 2018. If you are unsure what to do until then, ask your doctor or other care provider.   Tradjenta 5 MG Tabs tablet Generic drug:  linagliptin Take 5 mg by mouth daily.   Trulicity 9.38 BO/1.7PZ Sopn Generic drug:  Dulaglutide Inject 0.5 mLs into the skin every Monday.   vitamin B-12 1000 MCG tablet Commonly known as:  CYANOCOBALAMIN Take 1,000 mcg by mouth daily.   vitamin C 500 MG tablet Commonly known as:  ASCORBIC ACID Take 1,000 mg by mouth daily.   Xarelto 15 MG Tabs tablet Generic drug:  Rivaroxaban Take 15 mg by mouth daily.            Durable Medical Equipment  (From admission, onward)         Start     Ordered   11/27/18 1011  For home use only DME oxygen  Once     Question Answer Comment  Mode or (Route) Nasal cannula   Liters per Minute 2   Frequency Continuous (stationary and portable oxygen unit needed)   Oxygen conserving device Yes   Oxygen delivery system Gas      11/27/18 1010         Contact information for after-discharge care    Destination    [facility] Preferred SNF .   Service:  Skilled Nursing Contact information: [place] [id number] [phone]             Allergies  Allergen Reactions  . Adhesive [Tape] Rash    Consultations:  None   Procedures/Studies: Dg Chest Port 1 View  Result  Date: 11/16/2018 CLINICAL DATA:  Hypoxia EXAM: PORTABLE CHEST 1 VIEW COMPARISON:  11/15/2018 FINDINGS: Cardiac shadow is stable. Increasing bilateral patchy opacities are noted when compare with the prior exam. No sizable effusion is seen. No bony abnormality is noted. IMPRESSION: Patchy infiltrates slightly increased when compare with the prior exam. Electronically Signed   By: Inez Catalina M.D.   On: 11/16/2018 20:03   Dg Chest Port 1 View  Result Date: 11/15/2018 CLINICAL DATA:  Shortness of breath, cough. EXAM: PORTABLE CHEST 1 VIEW COMPARISON:  Radiograph of November 05, 2017. CT scan of November 06, 2017. FINDINGS: Stable cardiomegaly. No pneumothorax or pleural effusion is noted. Mildly increased bibasilar opacities are noted concerning for atelectasis or infiltrates. Bony thorax is unremarkable. IMPRESSION: Mild bibasilar opacities are noted concerning for subsegmental atelectasis or pneumonia. Electronically Signed   By: Marijo Conception M.D.   On: 11/15/2018 18:45    Subjective: He relates no new complaints.  Discharge Exam: Vitals:   11/27/18 0400 11/27/18 0800  BP:    Pulse: 75   Resp:    Temp:  98.4 F (36.9 C)  SpO2: (!) 89%      General: Pt is alert, awake, not in acute distress Cardiovascular: RRR, S1/S2 +, no rubs, no gallops Respiratory: CTA bilaterally, no wheezing, no  rhonchi Abdominal: Soft, NT, ND, bowel sounds + Extremities: no edema, no cyanosis    The results of significant diagnostics from this hospitalization (including imaging, microbiology, ancillary and laboratory) are listed below for reference.     Microbiology: No results found for this or any previous visit (from the past 240 hour(s)).   Labs: BNP (last 3 results) No results for input(s): BNP in the last 8760 hours. Basic Metabolic Panel: Recent Labs  Lab 11/23/18 0548 11/24/18 0311 11/25/18 0442 11/26/18 0457 11/27/18 0543  NA 144 148* 143 142 143  K 3.8 3.5 3.5 3.8 4.1  CL 100 104 104 103 106  CO2 31 32 31 31 28   GLUCOSE 209* 101* 90 157* 141*  BUN 93* 93* 88* 80* 69*  CREATININE 2.23* 2.54* 2.21* 2.39* 2.18*  CALCIUM 9.8 9.7 9.2 9.7 9.5   Liver Function Tests: Recent Labs  Lab 11/22/18 0422 11/25/18 0442 11/26/18 0457  AST 55* 41 41  ALT 112* 79* 74*  ALKPHOS 62 59 60  BILITOT 1.0 1.3* 1.3*  PROT 6.3* 5.7* 6.0*  ALBUMIN 3.1* 3.1* 3.3*   No results for input(s): LIPASE, AMYLASE in the last 168 hours. No results for input(s): AMMONIA in the last 168 hours. CBC: Recent Labs  Lab 11/22/18 0422 11/23/18 0548 11/24/18 0311 11/25/18 0442 11/26/18 0457  WBC 19.2* 15.2* 9.8 7.8 5.7  NEUTROABS 17.2*  --   --  6.3  --   HGB 12.8* 13.1 13.0 11.6* 11.8*  HCT 41.2 42.9 41.0 37.3* 39.1  MCV 84.3 85.1 85.6 85.9 87.9  PLT 289 298 266 228 226   Cardiac Enzymes: No results for input(s): CKTOTAL, CKMB, CKMBINDEX, TROPONINI in the last 168 hours. BNP: Invalid input(s): POCBNP CBG: Recent Labs  Lab 11/26/18 0843 11/26/18 1138 11/26/18 1600 11/26/18 2105 11/27/18 0834  GLUCAP 174* 227* 182* 142* 125*   D-Dimer No results for input(s): DDIMER in the last 72 hours. Hgb A1c No results for input(s): HGBA1C in the last 72 hours. Lipid Profile No results for input(s): CHOL, HDL, LDLCALC, TRIG, CHOLHDL, LDLDIRECT in the last 72 hours. Thyroid function  studies No results for input(s): TSH, T4TOTAL, T3FREE, THYROIDAB in the last  72 hours.  Invalid input(s): FREET3 Anemia work up Recent Labs    11/25/18 0442  FERRITIN 173   Urinalysis    Component Value Date/Time   COLORURINE YELLOW 05/13/2015 1549   APPEARANCEUR TURBID (A) 05/13/2015 1549   LABSPEC 1.010 05/13/2015 1549   PHURINE 5.5 05/13/2015 1549   GLUCOSEU NEGATIVE 05/13/2015 1549   HGBUR LARGE (A) 05/13/2015 1549   BILIRUBINUR NEGATIVE 05/13/2015 1549   KETONESUR NEGATIVE 05/13/2015 1549   PROTEINUR 30 (A) 05/13/2015 1549   UROBILINOGEN 1.0 05/13/2015 1549   NITRITE NEGATIVE 05/13/2015 1549   LEUKOCYTESUR SMALL (A) 05/13/2015 1549   Sepsis Labs Invalid input(s): PROCALCITONIN,  WBC,  LACTICIDVEN Microbiology No results found for this or any previous visit (from the past 240 hour(s)).   Time coordinating discharge: 40 minutes  SIGNED:   Charlynne Cousins, MD  [facility]

## 2018-11-26 NOTE — Progress Notes (Signed)
Patient's son [name] updated over the phone.

## 2018-11-26 NOTE — Progress Notes (Signed)
[facility] PROGRESS NOTE    Progress Note  Reginald [name]  LOV:[id number] DOB: 1935/09/23 DOA: 11/15/2018 PCP: Guadlupe Spanish, MD     Brief Narrative:   Reginald [name] is an 83 y.o. male past medical history of atrial fibrillation on anticoagulation, essential hypertension morbid obesity chronic venous stasis ulcer presents to the [facility] long ED on 11/15/2018 for shortness of breath and fever  Assessment/Plan:   Acute respiratory failure with hypoxia due to COVID-19 virus infection/SARS-CoV-2: Completed course of Plaquenil and a 5-day course of IV antibiotics. Please with also place of steroids on admission which he completed his course. He was given 2 doses of IV Actemra.  He was fluid restricted.  And he was weaned off oxygen.  Acute kidney injury on chronic kidney disease stage IV: With a baseline creatinine of 1.8-2.0. His diuretic was stopped, his creatinine is improving slowly. We will start him on gentle IV fluid hydration continue to hold diuretics and recheck a basic metabolic panel in the morning.  Essential hypertension: BP well controlled.  Chronic diastolic heart failure: 2D echo done in 2019 showed normal static function with an EF of 50 to 60%. Trace lower extremity edema.  Diabetes mellitus type 2: Reasonably controlled continue current regimen.  History of peripheral neuropathy: Continue Neurontin.  Hyperlipidemia: No acute changes made to his medication.   Present on admission sacral decubitus ulcer stage II RN Pressure Injury Documentation: Pressure Ulcer 05/15/15 Stage II -  Partial thickness loss of dermis presenting as a shallow open ulcer with a red, pink wound bed without slough. red area  (Active)  05/15/15 1940  Location: Buttocks  Location Orientation: Left  Staging: Stage II -  Partial thickness loss of dermis presenting as a shallow open ulcer with a red, pink wound bed without slough.  Wound Description (Comments): red area    Present on Admission:     DVT prophylaxis: lovenox Family Communication:none Disposition Plan/Barrier to D/C: SNF in am once Cr is improved. Code Status:     Code Status Orders  (From admission, onward)         Start     Ordered   11/16/18 0420  Full code  Continuous     11/16/18 0420        Code Status History    Date Active Date Inactive Code Status Order ID Comments User Context   10/27/2016 1403 10/28/2016 1847 Full Code [id number]  Theodoro Grist, MD Inpatient   05/08/2015 0129 05/18/2015 1949 Full Code [id number]  Lavina Hamman, MD ED        IV Access:    Peripheral IV   Procedures and diagnostic studies:   No results found.   Medical Consultants:    None.  Anti-Infectives:   None  Subjective:    Reginald [name] has no new complaints.  Objective:    Vitals:   11/25/18 2000 11/26/18 0400 11/26/18 0427 11/26/18 0844  BP: 119/60 132/76    Pulse:  73    Resp: (!) 21     Temp: 98.2 F (36.8 C) 98.1 F (36.7 C)  (!) 96.1 F (35.6 C)  TempSrc: Oral Oral  Axillary  SpO2: 92%     Weight:   (!) 140.2 kg   Height:        Intake/Output Summary (Last 24 hours) at 11/26/2018 1231 Last data filed at 11/26/2018 0500 Gross per 24 hour  Intake 480 ml  Output 800 ml  Net -320 ml  Filed Weights   11/24/18 0500 11/25/18 0500 11/26/18 0427  Weight: (!) 142.1 kg (!) 142 kg (!) 140.2 kg    Exam: General exam: In no acute distress. Respiratory system: Good air movement and clear to auscultation. Cardiovascular system: S1 & S2 heard, RRR.  Gastrointestinal system: Abdomen is nondistended, soft and nontender.  Central nervous system: Alert and oriented. No focal neurological deficits. Extremities: No pedal edema. Skin: No rashes, lesions or ulcers   Data Reviewed:    Labs: Basic Metabolic Panel: Recent Labs  Lab 11/20/18 0437  11/22/18 0422 11/23/18 0548 11/24/18 0311 11/25/18 0442 11/26/18 0457  NA 137   < > 141 144 148* 143 142  K  4.1   < > 3.8 3.8 3.5 3.5 3.8  CL 101   < > 102 100 104 104 103  CO2 25   < > 27 31 32 31 31  GLUCOSE 344*   < > 245* 209* 101* 90 157*  BUN 70*   < > 79* 93* 93* 88* 80*  CREATININE 2.12*   < > 1.89* 2.23* 2.54* 2.21* 2.39*  CALCIUM 8.7*   < > 9.6 9.8 9.7 9.2 9.7  MG 2.3  --   --   --   --   --   --   PHOS 3.2  --   --   --   --   --   --    < > = values in this interval not displayed.   GFR Estimated Creatinine Clearance: 34.8 mL/min (A) (by C-G formula based on SCr of 2.39 mg/dL (H)). Liver Function Tests: Recent Labs  Lab 11/20/18 0437 11/22/18 0422 11/25/18 0442 11/26/18 0457  AST 79* 55* 41 41  ALT 92* 112* 79* 74*  ALKPHOS 54 62 59 60  BILITOT 0.7 1.0 1.3* 1.3*  PROT 6.1* 6.3* 5.7* 6.0*  ALBUMIN 2.9* 3.1* 3.1* 3.3*   No results for input(s): LIPASE, AMYLASE in the last 168 hours. No results for input(s): AMMONIA in the last 168 hours. Coagulation profile No results for input(s): INR, PROTIME in the last 168 hours. COVID-19 Labs  Recent Labs    11/25/18 0442  FERRITIN 173  CRP <0.8    Lab Results  Component Value Date   SARSCOV2NAA POSITIVE (A) 11/15/2018    CBC: Recent Labs  Lab 11/20/18 0437 11/22/18 0422 11/23/18 0548 11/24/18 0311 11/25/18 0442 11/26/18 0457  WBC 11.6* 19.2* 15.2* 9.8 7.8 5.7  NEUTROABS 10.6* 17.2*  --   --  6.3  --   HGB 12.2* 12.8* 13.1 13.0 11.6* 11.8*  HCT 39.7 41.2 42.9 41.0 37.3* 39.1  MCV 85.6 84.3 85.1 85.6 85.9 87.9  PLT 249 289 298 266 228 226   Cardiac Enzymes: No results for input(s): CKTOTAL, CKMB, CKMBINDEX, TROPONINI in the last 168 hours. BNP (last 3 results) No results for input(s): PROBNP in the last 8760 hours. CBG: Recent Labs  Lab 11/25/18 1136 11/25/18 1704 11/25/18 2043 11/26/18 0843 11/26/18 1138  GLUCAP 180* 196* 205* 174* 227*   D-Dimer: No results for input(s): DDIMER in the last 72 hours. Hgb A1c: No results for input(s): HGBA1C in the last 72 hours. Lipid Profile: No results for  input(s): CHOL, HDL, LDLCALC, TRIG, CHOLHDL, LDLDIRECT in the last 72 hours. Thyroid function studies: No results for input(s): TSH, T4TOTAL, T3FREE, THYROIDAB in the last 72 hours.  Invalid input(s): FREET3 Anemia work up: Recent Labs    11/25/18 0442  FERRITIN 173   Sepsis Labs: Recent  Labs  Lab 11/23/18 0548 11/24/18 0311 11/25/18 0442 11/26/18 0457  WBC 15.2* 9.8 7.8 5.7   Microbiology No results found for this or any previous visit (from the past 240 hour(s)).   Medications:   . amLODipine  10 mg Oral Daily  . atorvastatin  20 mg Oral QHS  . escitalopram  5 mg Oral Daily  . famotidine  20 mg Oral Daily  . gabapentin  100 mg Oral TID  . insulin aspart  0-20 Units Subcutaneous TID WC  . insulin aspart  0-5 Units Subcutaneous QHS  . insulin aspart  5 Units Subcutaneous TID WC  . insulin glargine  15 Units Subcutaneous BID  . latanoprost  1 drop Both Eyes QHS  . loratadine  10 mg Oral Daily  . magnesium oxide  400 mg Oral Daily  . mouth rinse  15 mL Mouth Rinse BID  . Melatonin  5 mg Oral QHS  . metoprolol tartrate  25 mg Oral Daily  . potassium chloride SA  40 mEq Oral Daily  . Rivaroxaban  15 mg Oral Q breakfast  . vitamin B-12  1,000 mcg Oral Daily  . vitamin C  500 mg Oral Daily  . zinc sulfate  220 mg Oral Daily   Continuous Infusions: . sodium chloride        LOS: 11 days   Charlynne Cousins  [facility]  11/26/2018, 12:31 PM

## 2018-11-27 DIAGNOSIS — J449 Chronic obstructive pulmonary disease, unspecified: Secondary | ICD-10-CM | POA: Diagnosis not present

## 2018-11-27 DIAGNOSIS — R278 Other lack of coordination: Secondary | ICD-10-CM | POA: Diagnosis not present

## 2018-11-27 DIAGNOSIS — E119 Type 2 diabetes mellitus without complications: Secondary | ICD-10-CM | POA: Diagnosis not present

## 2018-11-27 DIAGNOSIS — I5032 Chronic diastolic (congestive) heart failure: Secondary | ICD-10-CM | POA: Diagnosis not present

## 2018-11-27 DIAGNOSIS — N289 Disorder of kidney and ureter, unspecified: Secondary | ICD-10-CM | POA: Diagnosis not present

## 2018-11-27 DIAGNOSIS — R2681 Unsteadiness on feet: Secondary | ICD-10-CM | POA: Diagnosis not present

## 2018-11-27 DIAGNOSIS — R41841 Cognitive communication deficit: Secondary | ICD-10-CM | POA: Diagnosis not present

## 2018-11-27 DIAGNOSIS — Z6841 Body Mass Index (BMI) 40.0 and over, adult: Secondary | ICD-10-CM | POA: Diagnosis not present

## 2018-11-27 DIAGNOSIS — I25118 Atherosclerotic heart disease of native coronary artery with other forms of angina pectoris: Secondary | ICD-10-CM | POA: Diagnosis not present

## 2018-11-27 DIAGNOSIS — E785 Hyperlipidemia, unspecified: Secondary | ICD-10-CM | POA: Diagnosis not present

## 2018-11-27 DIAGNOSIS — I482 Chronic atrial fibrillation, unspecified: Secondary | ICD-10-CM | POA: Diagnosis not present

## 2018-11-27 DIAGNOSIS — G4733 Obstructive sleep apnea (adult) (pediatric): Secondary | ICD-10-CM | POA: Diagnosis not present

## 2018-11-27 DIAGNOSIS — M2550 Pain in unspecified joint: Secondary | ICD-10-CM | POA: Diagnosis not present

## 2018-11-27 DIAGNOSIS — Z79899 Other long term (current) drug therapy: Secondary | ICD-10-CM | POA: Diagnosis not present

## 2018-11-27 DIAGNOSIS — R0902 Hypoxemia: Secondary | ICD-10-CM | POA: Diagnosis not present

## 2018-11-27 DIAGNOSIS — U071 COVID-19: Secondary | ICD-10-CM | POA: Diagnosis not present

## 2018-11-27 DIAGNOSIS — K649 Unspecified hemorrhoids: Secondary | ICD-10-CM | POA: Diagnosis not present

## 2018-11-27 DIAGNOSIS — N1330 Unspecified hydronephrosis: Secondary | ICD-10-CM | POA: Diagnosis not present

## 2018-11-27 DIAGNOSIS — N170 Acute kidney failure with tubular necrosis: Secondary | ICD-10-CM | POA: Diagnosis not present

## 2018-11-27 DIAGNOSIS — I4821 Permanent atrial fibrillation: Secondary | ICD-10-CM | POA: Diagnosis not present

## 2018-11-27 DIAGNOSIS — J189 Pneumonia, unspecified organism: Secondary | ICD-10-CM | POA: Diagnosis not present

## 2018-11-27 DIAGNOSIS — E876 Hypokalemia: Secondary | ICD-10-CM | POA: Diagnosis not present

## 2018-11-27 DIAGNOSIS — E860 Dehydration: Secondary | ICD-10-CM | POA: Diagnosis not present

## 2018-11-27 DIAGNOSIS — J984 Other disorders of lung: Secondary | ICD-10-CM | POA: Diagnosis not present

## 2018-11-27 DIAGNOSIS — E1122 Type 2 diabetes mellitus with diabetic chronic kidney disease: Secondary | ICD-10-CM | POA: Diagnosis not present

## 2018-11-27 DIAGNOSIS — R1312 Dysphagia, oropharyngeal phase: Secondary | ICD-10-CM | POA: Diagnosis not present

## 2018-11-27 DIAGNOSIS — R001 Bradycardia, unspecified: Secondary | ICD-10-CM | POA: Diagnosis not present

## 2018-11-27 DIAGNOSIS — R531 Weakness: Secondary | ICD-10-CM | POA: Diagnosis not present

## 2018-11-27 DIAGNOSIS — Z9861 Coronary angioplasty status: Secondary | ICD-10-CM | POA: Diagnosis not present

## 2018-11-27 DIAGNOSIS — I252 Old myocardial infarction: Secondary | ICD-10-CM | POA: Diagnosis not present

## 2018-11-27 DIAGNOSIS — I480 Paroxysmal atrial fibrillation: Secondary | ICD-10-CM | POA: Diagnosis not present

## 2018-11-27 DIAGNOSIS — I2510 Atherosclerotic heart disease of native coronary artery without angina pectoris: Secondary | ICD-10-CM | POA: Diagnosis not present

## 2018-11-27 DIAGNOSIS — N178 Other acute kidney failure: Secondary | ICD-10-CM | POA: Diagnosis not present

## 2018-11-27 DIAGNOSIS — I130 Hypertensive heart and chronic kidney disease with heart failure and stage 1 through stage 4 chronic kidney disease, or unspecified chronic kidney disease: Secondary | ICD-10-CM | POA: Diagnosis not present

## 2018-11-27 DIAGNOSIS — M6281 Muscle weakness (generalized): Secondary | ICD-10-CM | POA: Diagnosis not present

## 2018-11-27 DIAGNOSIS — N183 Chronic kidney disease, stage 3 (moderate): Secondary | ICD-10-CM | POA: Diagnosis not present

## 2018-11-27 DIAGNOSIS — R2689 Other abnormalities of gait and mobility: Secondary | ICD-10-CM | POA: Diagnosis not present

## 2018-11-27 DIAGNOSIS — D649 Anemia, unspecified: Secondary | ICD-10-CM | POA: Diagnosis not present

## 2018-11-27 DIAGNOSIS — K5909 Other constipation: Secondary | ICD-10-CM | POA: Diagnosis not present

## 2018-11-27 DIAGNOSIS — M25571 Pain in right ankle and joints of right foot: Secondary | ICD-10-CM | POA: Diagnosis not present

## 2018-11-27 DIAGNOSIS — R5381 Other malaise: Secondary | ICD-10-CM | POA: Diagnosis not present

## 2018-11-27 DIAGNOSIS — E782 Mixed hyperlipidemia: Secondary | ICD-10-CM | POA: Diagnosis not present

## 2018-11-27 DIAGNOSIS — N179 Acute kidney failure, unspecified: Secondary | ICD-10-CM | POA: Diagnosis not present

## 2018-11-27 DIAGNOSIS — J80 Acute respiratory distress syndrome: Secondary | ICD-10-CM | POA: Diagnosis not present

## 2018-11-27 DIAGNOSIS — I10 Essential (primary) hypertension: Secondary | ICD-10-CM | POA: Diagnosis not present

## 2018-11-27 DIAGNOSIS — Z7401 Bed confinement status: Secondary | ICD-10-CM | POA: Diagnosis not present

## 2018-11-27 DIAGNOSIS — Z7901 Long term (current) use of anticoagulants: Secondary | ICD-10-CM | POA: Diagnosis not present

## 2018-11-27 DIAGNOSIS — J9601 Acute respiratory failure with hypoxia: Secondary | ICD-10-CM | POA: Diagnosis not present

## 2018-11-27 DIAGNOSIS — I4891 Unspecified atrial fibrillation: Secondary | ICD-10-CM | POA: Diagnosis not present

## 2018-11-27 DIAGNOSIS — R600 Localized edema: Secondary | ICD-10-CM | POA: Diagnosis not present

## 2018-11-27 DIAGNOSIS — E118 Type 2 diabetes mellitus with unspecified complications: Secondary | ICD-10-CM | POA: Diagnosis not present

## 2018-11-27 DIAGNOSIS — I361 Nonrheumatic tricuspid (valve) insufficiency: Secondary | ICD-10-CM | POA: Diagnosis not present

## 2018-11-27 DIAGNOSIS — J188 Other pneumonia, unspecified organism: Secondary | ICD-10-CM | POA: Diagnosis not present

## 2018-11-27 DIAGNOSIS — N184 Chronic kidney disease, stage 4 (severe): Secondary | ICD-10-CM | POA: Diagnosis not present

## 2018-11-27 DIAGNOSIS — N400 Enlarged prostate without lower urinary tract symptoms: Secondary | ICD-10-CM | POA: Diagnosis not present

## 2018-11-27 LAB — GLUCOSE, CAPILLARY
Glucose-Capillary: 125 mg/dL — ABNORMAL HIGH (ref 70–99)
Glucose-Capillary: 201 mg/dL — ABNORMAL HIGH (ref 70–99)

## 2018-11-27 LAB — BASIC METABOLIC PANEL
Anion gap: 9 (ref 5–15)
BUN: 69 mg/dL — ABNORMAL HIGH (ref 8–23)
CO2: 28 mmol/L (ref 22–32)
Calcium: 9.5 mg/dL (ref 8.9–10.3)
Chloride: 106 mmol/L (ref 98–111)
Creatinine, Ser: 2.18 mg/dL — ABNORMAL HIGH (ref 0.61–1.24)
GFR calc Af Amer: 32 mL/min — ABNORMAL LOW (ref >60)
GFR calc non Af Amer: 28 mL/min — ABNORMAL LOW (ref >60)
Glucose, Bld: 141 mg/dL — ABNORMAL HIGH (ref 70–99)
Potassium: 4.1 mmol/L (ref 3.5–5.1)
Sodium: 143 mmol/L (ref 135–145)

## 2018-11-27 MED ORDER — TORSEMIDE 20 MG PO TABS: 40.0000 mg | ORAL_TABLET | Freq: Every day | ORAL | Status: DC

## 2018-11-27 MED ORDER — SODIUM CHLORIDE 0.9 % IV BOLUS
500.0000 mL | Freq: Once | INTRAVENOUS | Status: AC
  Administered 2018-11-27: 500 mL via INTRAVENOUS

## 2018-11-27 NOTE — Progress Notes (Signed)
Occupational Therapy Treatment Patient Details Name: Reginald [name] MRN: [id number] DOB: [birth year] Today's Date: 11/27/2018    History of present illness Pt adm with SOB and fever. Pt with PNA and COVID 19 positiive. PMH - morbid obesity, ckd, dm, chronic venous stasis, copd, chf, MI, afib, lumbar fusion   OT comments  Continues to slowly progress toward goals. Will need rehab at SNF to return to PLOF. Anticipate DC to [facility] today per nursing.   Follow Up Recommendations  SNF;Supervision/Assistance - 24 hour    Equipment Recommendations  Other (comment)    Recommendations for Other Services      Precautions / Restrictions Precautions Precautions: Fall Precaution Comments: monitor O2 sats, states legs give out, has been  dropping sats more, needs Oxygen for ambulation now       Mobility Bed Mobility               General bed mobility comments: OOB in chair  Transfers Overall transfer level: Needs assistance Equipment used: Rolling walker (2 wheeled) Transfers: Sit to/from Stand Sit to Stand: Mod assist;+2 physical assistance         General transfer comment: stood from recliner for pericare    Balance     Sitting balance-Leahy Scale: Fair       Standing balance-Leahy Scale: Poor Standing balance comment: reliant on external support                           ADL either performed or assessed with clinical judgement   ADL Overall ADL's : Needs assistance/impaired Eating/Feeding: Set up;Sitting   Grooming: Set up;Sitting   Upper Body Bathing: Set up;Supervision/ safety;Sitting   Lower Body Bathing: Moderate assistance;Sit to/from stand                       Functional mobility during ADLs: Moderate assistance;Rolling walker;+2 for safety/equipment       Vision       Perception     Praxis      Cognition Arousal/Alertness: Awake/alert Behavior During Therapy: Flat affect Overall Cognitive Status: Impaired/Different  from baseline                   Orientation Level: Time;Situation Current Attention Level: Sustained   Following Commands: Follows one step commands inconsistently;Follows one step commands with increased time Safety/Judgement: Decreased awareness of deficits Awareness: Intellectual Problem Solving: Slow processing;Decreased initiation;Difficulty sequencing;Requires verbal cues;Requires tactile cues          Exercises     Shoulder Instructions       General Comments      Pertinent Vitals/ Pain       Pain Assessment: No/denies pain  Home Living                                          Prior Functioning/Environment              Frequency  Min 2X/week        Progress Toward Goals  OT Goals(current goals can now be found in the care plan section)  Progress towards OT goals: Progressing toward goals  Acute Rehab OT Goals Patient Stated Goal: to go to [facility] OT Goal Formulation: With patient Time For Goal Achievement: 11/30/18 Potential to Achieve Goals: Good ADL Goals Pt Will Perform Grooming: with supervision;standing Pt Will  Perform Lower Body Bathing: with min guard assist;sit to/from stand Pt Will Perform Upper Body Dressing: with set-up;sitting Pt Will Perform Lower Body Dressing: with min guard assist;sit to/from stand Pt Will Transfer to Toilet: with supervision;ambulating Pt Will Perform Toileting - Clothing Manipulation and hygiene: with supervision;sit to/from stand Pt/caregiver will Perform Home Exercise Program: Increased ROM;Increased strength;Both right and left upper extremity;With Supervision  Plan Discharge plan remains appropriate    Co-evaluation                 AM-PAC OT "6 Clicks" Daily Activity     Outcome Measure   Help from another person eating meals?: None Help from another person taking care of personal grooming?: A Little Help from another person toileting, which includes using toliet, bedpan,  or urinal?: A Lot Help from another person bathing (including washing, rinsing, drying)?: A Lot Help from another person to put on and taking off regular upper body clothing?: A Lot Help from another person to put on and taking off regular lower body clothing?: A Lot 6 Click Score: 15    End of Session Equipment Utilized During Treatment: Gait belt;Rolling walker;Oxygen(2L)  OT Visit Diagnosis: Muscle weakness (generalized) (M62.81);Unsteadiness on feet (R26.81)   Activity Tolerance Patient tolerated treatment well   Patient Left in chair;with call bell/phone within reach;with chair alarm set   Nurse Communication Mobility status        Time: 1210-1240 OT Time Calculation (min): 30 min  Charges: OT General Charges $OT Visit: 1 Visit OT Treatments $Self Care/Home Management : 23-37 mins  Maurie Boettcher, OT/L   Acute OT Clinical Specialist [place] Pager [phone] Office [phone]    [facility] 11/27/2018, 2:04 PM

## 2018-11-27 NOTE — Progress Notes (Signed)
Attempted to call report four times.  Each time I was transferred to the unit that patient is on,the  phone was never answered.  Will leave call back number with [facility], and give a report.

## 2018-11-27 NOTE — Progress Notes (Signed)
Patient's son [name] updated on plan for patient.

## 2018-11-27 NOTE — Progress Notes (Signed)
SATURATION QUALIFICATIONS: (This note is used to comply with regulatory documentation for home oxygen)  Patient Saturations on Room Air at Rest = 90%  Patient Saturations on Room Air while Ambulating = 85%  Patient Saturations on 2 Liters of oxygen while Ambulating 91%  Patient's oxygen saturation dropped to the mid 80's while getting up from the bed to the chair.

## 2018-11-27 NOTE — Progress Notes (Signed)
Updated discharge note:  Patient will DC to: [place] Anticipated DC date: 11/27/2018 Family notified: [name] Transport by: Corey Harold  Per MD patient ready for DC to [place] [place]. RN, patient, patient's family, and facility notified of DC. Discharge Summary sent to facility. RN given number for report [phone] Room 704P . DC packet on chart. Ambulance transport requested for patient for 1:00 pm per nurse request. Jessamine signing off.  [place], [place]

## 2018-11-28 ENCOUNTER — Telehealth (INDEPENDENT_AMBULATORY_CARE_PROVIDER_SITE_OTHER): Payer: Self-pay | Admitting: Internal Medicine

## 2018-11-28 DIAGNOSIS — J984 Other disorders of lung: Secondary | ICD-10-CM | POA: Diagnosis not present

## 2018-11-28 DIAGNOSIS — I10 Essential (primary) hypertension: Secondary | ICD-10-CM | POA: Diagnosis not present

## 2018-11-28 DIAGNOSIS — I480 Paroxysmal atrial fibrillation: Secondary | ICD-10-CM | POA: Diagnosis not present

## 2018-11-28 DIAGNOSIS — N178 Other acute kidney failure: Secondary | ICD-10-CM | POA: Diagnosis not present

## 2018-11-28 DIAGNOSIS — U071 COVID-19: Secondary | ICD-10-CM | POA: Diagnosis not present

## 2018-11-28 DIAGNOSIS — E119 Type 2 diabetes mellitus without complications: Secondary | ICD-10-CM | POA: Diagnosis not present

## 2018-11-28 DIAGNOSIS — R600 Localized edema: Secondary | ICD-10-CM | POA: Diagnosis not present

## 2018-11-28 DIAGNOSIS — J188 Other pneumonia, unspecified organism: Secondary | ICD-10-CM | POA: Diagnosis not present

## 2018-11-28 NOTE — Telephone Encounter (Signed)
Called regarding [facility] [facility]. Pt is asst living/rehab facility and they are following trx.

## 2018-11-29 DIAGNOSIS — U071 COVID-19: Secondary | ICD-10-CM | POA: Diagnosis not present

## 2018-11-29 DIAGNOSIS — I130 Hypertensive heart and chronic kidney disease with heart failure and stage 1 through stage 4 chronic kidney disease, or unspecified chronic kidney disease: Secondary | ICD-10-CM | POA: Diagnosis not present

## 2018-11-29 DIAGNOSIS — J9601 Acute respiratory failure with hypoxia: Secondary | ICD-10-CM | POA: Diagnosis not present

## 2018-11-29 DIAGNOSIS — I480 Paroxysmal atrial fibrillation: Secondary | ICD-10-CM | POA: Diagnosis not present

## 2018-11-29 DIAGNOSIS — N178 Other acute kidney failure: Secondary | ICD-10-CM | POA: Diagnosis not present

## 2018-11-29 DIAGNOSIS — I5032 Chronic diastolic (congestive) heart failure: Secondary | ICD-10-CM | POA: Diagnosis not present

## 2018-12-05 DIAGNOSIS — J189 Pneumonia, unspecified organism: Secondary | ICD-10-CM | POA: Diagnosis not present

## 2018-12-05 DIAGNOSIS — J449 Chronic obstructive pulmonary disease, unspecified: Secondary | ICD-10-CM | POA: Diagnosis not present

## 2018-12-05 DIAGNOSIS — U071 COVID-19: Secondary | ICD-10-CM | POA: Diagnosis not present

## 2018-12-05 DIAGNOSIS — E118 Type 2 diabetes mellitus with unspecified complications: Secondary | ICD-10-CM | POA: Diagnosis not present

## 2018-12-11 DIAGNOSIS — I5032 Chronic diastolic (congestive) heart failure: Secondary | ICD-10-CM | POA: Diagnosis not present

## 2018-12-11 DIAGNOSIS — E119 Type 2 diabetes mellitus without complications: Secondary | ICD-10-CM | POA: Diagnosis not present

## 2018-12-11 DIAGNOSIS — I130 Hypertensive heart and chronic kidney disease with heart failure and stage 1 through stage 4 chronic kidney disease, or unspecified chronic kidney disease: Secondary | ICD-10-CM | POA: Diagnosis not present

## 2018-12-11 DIAGNOSIS — R600 Localized edema: Secondary | ICD-10-CM | POA: Diagnosis not present

## 2018-12-17 DIAGNOSIS — K5909 Other constipation: Secondary | ICD-10-CM | POA: Diagnosis not present

## 2018-12-17 DIAGNOSIS — I4891 Unspecified atrial fibrillation: Secondary | ICD-10-CM | POA: Diagnosis not present

## 2018-12-17 DIAGNOSIS — R600 Localized edema: Secondary | ICD-10-CM | POA: Diagnosis not present

## 2018-12-17 DIAGNOSIS — K649 Unspecified hemorrhoids: Secondary | ICD-10-CM | POA: Diagnosis not present

## 2018-12-19 ENCOUNTER — Encounter (HOSPITAL_COMMUNITY): Payer: Self-pay | Admitting: Emergency Medicine

## 2018-12-19 ENCOUNTER — Inpatient Hospital Stay (HOSPITAL_COMMUNITY): Payer: Medicare Other

## 2018-12-19 ENCOUNTER — Inpatient Hospital Stay (HOSPITAL_COMMUNITY)
Admission: EM | Admit: 2018-12-19 | Discharge: 2018-12-25 | DRG: 682 | Disposition: A | Discharge status: Skilled Nursing Facility | Payer: Medicare Other | Source: Skilled Nursing Facility | Attending: Internal Medicine | Admitting: Internal Medicine

## 2018-12-19 ENCOUNTER — Other Ambulatory Visit: Payer: Self-pay

## 2018-12-19 DIAGNOSIS — E1122 Type 2 diabetes mellitus with diabetic chronic kidney disease: Secondary | ICD-10-CM | POA: Diagnosis not present

## 2018-12-19 DIAGNOSIS — N184 Chronic kidney disease, stage 4 (severe): Secondary | ICD-10-CM | POA: Diagnosis not present

## 2018-12-19 DIAGNOSIS — Z6841 Body Mass Index (BMI) 40.0 and over, adult: Secondary | ICD-10-CM

## 2018-12-19 DIAGNOSIS — R5381 Other malaise: Secondary | ICD-10-CM | POA: Diagnosis present

## 2018-12-19 DIAGNOSIS — N179 Acute kidney failure, unspecified: Secondary | ICD-10-CM | POA: Diagnosis present

## 2018-12-19 DIAGNOSIS — E119 Type 2 diabetes mellitus without complications: Secondary | ICD-10-CM

## 2018-12-19 DIAGNOSIS — M6281 Muscle weakness (generalized): Secondary | ICD-10-CM | POA: Diagnosis not present

## 2018-12-19 DIAGNOSIS — I130 Hypertensive heart and chronic kidney disease with heart failure and stage 1 through stage 4 chronic kidney disease, or unspecified chronic kidney disease: Secondary | ICD-10-CM | POA: Diagnosis not present

## 2018-12-19 DIAGNOSIS — U071 COVID-19: Secondary | ICD-10-CM

## 2018-12-19 DIAGNOSIS — R41841 Cognitive communication deficit: Secondary | ICD-10-CM | POA: Diagnosis not present

## 2018-12-19 DIAGNOSIS — R918 Other nonspecific abnormal finding of lung field: Secondary | ICD-10-CM | POA: Diagnosis not present

## 2018-12-19 DIAGNOSIS — Z833 Family history of diabetes mellitus: Secondary | ICD-10-CM

## 2018-12-19 DIAGNOSIS — Z9181 History of falling: Secondary | ICD-10-CM | POA: Diagnosis not present

## 2018-12-19 DIAGNOSIS — E876 Hypokalemia: Secondary | ICD-10-CM | POA: Diagnosis not present

## 2018-12-19 DIAGNOSIS — I4821 Permanent atrial fibrillation: Secondary | ICD-10-CM | POA: Diagnosis present

## 2018-12-19 DIAGNOSIS — N400 Enlarged prostate without lower urinary tract symptoms: Secondary | ICD-10-CM | POA: Diagnosis not present

## 2018-12-19 DIAGNOSIS — E785 Hyperlipidemia, unspecified: Secondary | ICD-10-CM | POA: Diagnosis not present

## 2018-12-19 DIAGNOSIS — Z8249 Family history of ischemic heart disease and other diseases of the circulatory system: Secondary | ICD-10-CM

## 2018-12-19 DIAGNOSIS — Z7984 Long term (current) use of oral hypoglycemic drugs: Secondary | ICD-10-CM

## 2018-12-19 DIAGNOSIS — I482 Chronic atrial fibrillation, unspecified: Secondary | ICD-10-CM

## 2018-12-19 DIAGNOSIS — J9601 Acute respiratory failure with hypoxia: Secondary | ICD-10-CM | POA: Diagnosis not present

## 2018-12-19 DIAGNOSIS — R531 Weakness: Secondary | ICD-10-CM | POA: Diagnosis not present

## 2018-12-19 DIAGNOSIS — N289 Disorder of kidney and ureter, unspecified: Secondary | ICD-10-CM | POA: Diagnosis not present

## 2018-12-19 DIAGNOSIS — J189 Pneumonia, unspecified organism: Secondary | ICD-10-CM | POA: Diagnosis not present

## 2018-12-19 DIAGNOSIS — R2689 Other abnormalities of gait and mobility: Secondary | ICD-10-CM | POA: Diagnosis not present

## 2018-12-19 DIAGNOSIS — I10 Essential (primary) hypertension: Secondary | ICD-10-CM

## 2018-12-19 DIAGNOSIS — M25571 Pain in right ankle and joints of right foot: Secondary | ICD-10-CM | POA: Diagnosis present

## 2018-12-19 DIAGNOSIS — R52 Pain, unspecified: Secondary | ICD-10-CM | POA: Diagnosis not present

## 2018-12-19 DIAGNOSIS — N1330 Unspecified hydronephrosis: Secondary | ICD-10-CM | POA: Diagnosis present

## 2018-12-19 DIAGNOSIS — I4891 Unspecified atrial fibrillation: Secondary | ICD-10-CM | POA: Diagnosis present

## 2018-12-19 DIAGNOSIS — D649 Anemia, unspecified: Secondary | ICD-10-CM | POA: Diagnosis present

## 2018-12-19 DIAGNOSIS — R279 Unspecified lack of coordination: Secondary | ICD-10-CM | POA: Diagnosis not present

## 2018-12-19 DIAGNOSIS — Z9861 Coronary angioplasty status: Secondary | ICD-10-CM

## 2018-12-19 DIAGNOSIS — I25118 Atherosclerotic heart disease of native coronary artery with other forms of angina pectoris: Secondary | ICD-10-CM

## 2018-12-19 DIAGNOSIS — I5032 Chronic diastolic (congestive) heart failure: Secondary | ICD-10-CM | POA: Diagnosis not present

## 2018-12-19 DIAGNOSIS — J449 Chronic obstructive pulmonary disease, unspecified: Secondary | ICD-10-CM | POA: Diagnosis present

## 2018-12-19 DIAGNOSIS — M5489 Other dorsalgia: Secondary | ICD-10-CM | POA: Diagnosis not present

## 2018-12-19 DIAGNOSIS — N170 Acute kidney failure with tubular necrosis: Secondary | ICD-10-CM | POA: Diagnosis not present

## 2018-12-19 DIAGNOSIS — N183 Chronic kidney disease, stage 3 (moderate): Secondary | ICD-10-CM

## 2018-12-19 DIAGNOSIS — R2681 Unsteadiness on feet: Secondary | ICD-10-CM | POA: Diagnosis not present

## 2018-12-19 DIAGNOSIS — I252 Old myocardial infarction: Secondary | ICD-10-CM | POA: Diagnosis not present

## 2018-12-19 DIAGNOSIS — N189 Chronic kidney disease, unspecified: Secondary | ICD-10-CM | POA: Diagnosis not present

## 2018-12-19 DIAGNOSIS — G4733 Obstructive sleep apnea (adult) (pediatric): Secondary | ICD-10-CM | POA: Diagnosis not present

## 2018-12-19 DIAGNOSIS — I2510 Atherosclerotic heart disease of native coronary artery without angina pectoris: Secondary | ICD-10-CM | POA: Diagnosis present

## 2018-12-19 DIAGNOSIS — E860 Dehydration: Secondary | ICD-10-CM | POA: Diagnosis not present

## 2018-12-19 DIAGNOSIS — G9341 Metabolic encephalopathy: Secondary | ICD-10-CM | POA: Diagnosis not present

## 2018-12-19 DIAGNOSIS — R278 Other lack of coordination: Secondary | ICD-10-CM | POA: Diagnosis not present

## 2018-12-19 DIAGNOSIS — E782 Mixed hyperlipidemia: Secondary | ICD-10-CM | POA: Diagnosis not present

## 2018-12-19 DIAGNOSIS — R001 Bradycardia, unspecified: Secondary | ICD-10-CM | POA: Diagnosis present

## 2018-12-19 DIAGNOSIS — E1159 Type 2 diabetes mellitus with other circulatory complications: Secondary | ICD-10-CM | POA: Diagnosis present

## 2018-12-19 DIAGNOSIS — Z7901 Long term (current) use of anticoagulants: Secondary | ICD-10-CM | POA: Diagnosis not present

## 2018-12-19 DIAGNOSIS — Z743 Need for continuous supervision: Secondary | ICD-10-CM | POA: Diagnosis not present

## 2018-12-19 DIAGNOSIS — Z825 Family history of asthma and other chronic lower respiratory diseases: Secondary | ICD-10-CM

## 2018-12-19 DIAGNOSIS — R262 Difficulty in walking, not elsewhere classified: Secondary | ICD-10-CM | POA: Diagnosis not present

## 2018-12-19 DIAGNOSIS — I480 Paroxysmal atrial fibrillation: Secondary | ICD-10-CM | POA: Diagnosis not present

## 2018-12-19 DIAGNOSIS — I129 Hypertensive chronic kidney disease with stage 1 through stage 4 chronic kidney disease, or unspecified chronic kidney disease: Secondary | ICD-10-CM | POA: Diagnosis not present

## 2018-12-19 DIAGNOSIS — I361 Nonrheumatic tricuspid (valve) insufficiency: Secondary | ICD-10-CM | POA: Diagnosis not present

## 2018-12-19 DIAGNOSIS — D631 Anemia in chronic kidney disease: Secondary | ICD-10-CM | POA: Diagnosis not present

## 2018-12-19 HISTORY — DX: Permanent atrial fibrillation: I48.21

## 2018-12-19 LAB — SARS CORONAVIRUS 2: SARS Coronavirus 2: DETECTED — AB

## 2018-12-19 LAB — BASIC METABOLIC PANEL
Anion gap: 15 (ref 5–15)
BUN: 90 mg/dL — ABNORMAL HIGH (ref 8–23)
CO2: 30 mmol/L (ref 22–32)
Calcium: 10.4 mg/dL — ABNORMAL HIGH (ref 8.9–10.3)
Chloride: 96 mmol/L — ABNORMAL LOW (ref 98–111)
Creatinine, Ser: 4.74 mg/dL — ABNORMAL HIGH (ref 0.61–1.24)
GFR calc Af Amer: 12 mL/min — ABNORMAL LOW (ref >60)
GFR calc non Af Amer: 11 mL/min — ABNORMAL LOW (ref >60)
Glucose, Bld: 191 mg/dL — ABNORMAL HIGH (ref 70–99)
Potassium: 2.8 mmol/L — ABNORMAL LOW (ref 3.5–5.1)
Sodium: 141 mmol/L (ref 135–145)

## 2018-12-19 LAB — CBC
HCT: 42 % (ref 39.0–52.0)
Hemoglobin: 13.2 g/dL (ref 13.0–17.0)
MCH: 28 pg (ref 26.0–34.0)
MCHC: 31.4 g/dL (ref 30.0–36.0)
MCV: 89.2 fL (ref 80.0–100.0)
Platelets: 215 10*3/uL (ref 150–400)
RBC: 4.71 MIL/uL (ref 4.22–5.81)
RDW: 18.9 % — ABNORMAL HIGH (ref 11.5–15.5)
WBC: 5.1 10*3/uL (ref 4.0–10.5)
nRBC: 0 % (ref 0.0–0.2)

## 2018-12-19 MED ORDER — HYDROCORTISONE (PERIANAL) 2.5 % EX CREA
TOPICAL_CREAM | Freq: Three times a day (TID) | CUTANEOUS | Status: DC
  Administered 2018-12-20 – 2018-12-21 (×2): via RECTAL
  Administered 2018-12-21: 1 via RECTAL
  Administered 2018-12-22 (×2): via RECTAL
  Administered 2018-12-22: 1 via RECTAL
  Administered 2018-12-23 (×2): via RECTAL
  Administered 2018-12-24: 1 via RECTAL
  Administered 2018-12-24 – 2018-12-25 (×3): via RECTAL
  Filled 2018-12-19 (×2): qty 28.35

## 2018-12-19 MED ORDER — PHENYLEPH-SHARK LIV OIL-MO-PET 0.25-3-14-71.9 % RE OINT
1.0000 "application " | TOPICAL_OINTMENT | Freq: Three times a day (TID) | RECTAL | Status: DC
  Filled 2018-12-19: qty 28.4

## 2018-12-19 MED ORDER — VITAMIN B-12 1000 MCG PO TABS
1000.0000 ug | ORAL_TABLET | Freq: Every day | ORAL | Status: DC
  Administered 2018-12-20 – 2018-12-25 (×6): 1000 ug via ORAL
  Filled 2018-12-19 (×6): qty 1

## 2018-12-19 MED ORDER — HEPARIN SODIUM (PORCINE) 5000 UNIT/ML IJ SOLN
5000.0000 [IU] | Freq: Three times a day (TID) | INTRAMUSCULAR | Status: DC
  Administered 2018-12-19 – 2018-12-20 (×2): 5000 [IU] via SUBCUTANEOUS
  Filled 2018-12-19 (×2): qty 1

## 2018-12-19 MED ORDER — SODIUM CHLORIDE 0.9 % IV SOLN
INTRAVENOUS | Status: DC
  Administered 2018-12-19 – 2018-12-23 (×9): via INTRAVENOUS

## 2018-12-19 MED ORDER — ACETAMINOPHEN 650 MG RE SUPP: 650.0000 mg | Freq: Four times a day (QID) | RECTAL | Status: DC | PRN

## 2018-12-19 MED ORDER — ONDANSETRON HCL 4 MG/2ML IJ SOLN: 4.0000 mg | Freq: Four times a day (QID) | INTRAMUSCULAR | Status: DC | PRN

## 2018-12-19 MED ORDER — LORATADINE 10 MG PO TABS
10.0000 mg | ORAL_TABLET | Freq: Every day | ORAL | Status: DC
  Administered 2018-12-19 – 2018-12-25 (×7): 10 mg via ORAL
  Filled 2018-12-19 (×7): qty 1

## 2018-12-19 MED ORDER — POTASSIUM CHLORIDE CRYS ER 20 MEQ PO TBCR
40.0000 meq | EXTENDED_RELEASE_TABLET | Freq: Once | ORAL | Status: AC
  Administered 2018-12-19: 40 meq via ORAL
  Filled 2018-12-19: qty 2

## 2018-12-19 MED ORDER — MELATONIN 5 MG PO TABS: 5.0000 mg | ORAL_TABLET | Freq: Every day | ORAL | Status: DC

## 2018-12-19 MED ORDER — ONDANSETRON HCL 4 MG PO TABS: 4.0000 mg | ORAL_TABLET | Freq: Four times a day (QID) | ORAL | Status: DC | PRN

## 2018-12-19 MED ORDER — HYDROCERIN EX CREA
TOPICAL_CREAM | Freq: Two times a day (BID) | CUTANEOUS | Status: DC
  Administered 2018-12-19 – 2018-12-21 (×3): via TOPICAL
  Administered 2018-12-21 – 2018-12-22 (×2): 1 via TOPICAL
  Administered 2018-12-22 – 2018-12-23 (×3): via TOPICAL
  Administered 2018-12-24: 1 via TOPICAL
  Administered 2018-12-24 – 2018-12-25 (×2): via TOPICAL
  Filled 2018-12-19 (×2): qty 113

## 2018-12-19 MED ORDER — LATANOPROST 0.005 % OP SOLN
1.0000 [drp] | Freq: Every day | OPHTHALMIC | Status: DC
  Administered 2018-12-19 – 2018-12-24 (×5): 1 [drp] via OPHTHALMIC
  Filled 2018-12-19: qty 2.5

## 2018-12-19 MED ORDER — ESCITALOPRAM OXALATE 10 MG PO TABS
5.0000 mg | ORAL_TABLET | Freq: Every day | ORAL | Status: DC
  Administered 2018-12-21 – 2018-12-25 (×5): 5 mg via ORAL
  Filled 2018-12-19 (×7): qty 1

## 2018-12-19 MED ORDER — CALCIUM CARBONATE ANTACID 1250 MG/5ML PO SUSP
500.0000 mg | Freq: Four times a day (QID) | ORAL | Status: DC | PRN
  Filled 2018-12-19: qty 5

## 2018-12-19 MED ORDER — METOPROLOL SUCCINATE ER 25 MG PO TB24
25.0000 mg | ORAL_TABLET | Freq: Every day | ORAL | Status: DC
  Filled 2018-12-19: qty 1

## 2018-12-19 MED ORDER — ACETAMINOPHEN 325 MG PO TABS
650.0000 mg | ORAL_TABLET | Freq: Four times a day (QID) | ORAL | Status: DC | PRN
  Administered 2018-12-22: 650 mg via ORAL
  Filled 2018-12-19: qty 2

## 2018-12-19 MED ORDER — DOCUSATE SODIUM 100 MG PO CAPS
100.0000 mg | ORAL_CAPSULE | Freq: Every day | ORAL | Status: DC
  Administered 2018-12-19 – 2018-12-24 (×6): 100 mg via ORAL
  Filled 2018-12-19 (×6): qty 1

## 2018-12-19 MED ORDER — NITROGLYCERIN 0.4 MG SL SUBL: 0.4000 mg | SUBLINGUAL_TABLET | SUBLINGUAL | Status: DC | PRN

## 2018-12-19 MED ORDER — GABAPENTIN 100 MG PO CAPS
100.0000 mg | ORAL_CAPSULE | Freq: Three times a day (TID) | ORAL | Status: DC
  Administered 2018-12-19 – 2018-12-25 (×17): 100 mg via ORAL
  Filled 2018-12-19 (×19): qty 1

## 2018-12-19 MED ORDER — ZOLPIDEM TARTRATE 5 MG PO TABS
5.0000 mg | ORAL_TABLET | Freq: Every evening | ORAL | Status: DC | PRN
  Administered 2018-12-23: 5 mg via ORAL
  Filled 2018-12-19: qty 1

## 2018-12-19 MED ORDER — HYDROXYZINE HCL 25 MG PO TABS: 25.0000 mg | ORAL_TABLET | Freq: Three times a day (TID) | ORAL | Status: DC | PRN

## 2018-12-19 MED ORDER — ACETAMINOPHEN 325 MG PO TABS: 650.0000 mg | ORAL_TABLET | ORAL | Status: DC | PRN

## 2018-12-19 MED ORDER — SODIUM CHLORIDE 0.9 % IV BOLUS
500.0000 mL | Freq: Once | INTRAVENOUS | Status: AC
  Administered 2018-12-19: 500 mL via INTRAVENOUS

## 2018-12-19 MED ORDER — ADULT MULTIVITAMIN W/MINERALS CH
1.0000 | ORAL_TABLET | Freq: Every evening | ORAL | Status: DC
  Administered 2018-12-19 – 2018-12-24 (×6): 1 via ORAL
  Filled 2018-12-19 (×6): qty 1

## 2018-12-19 MED ORDER — EUCERIN EX CREA: 1.0000 "application " | TOPICAL_CREAM | Freq: Two times a day (BID) | CUTANEOUS | Status: DC

## 2018-12-19 MED ORDER — CAMPHOR-MENTHOL 0.5-0.5 % EX LOTN
1.0000 "application " | TOPICAL_LOTION | Freq: Three times a day (TID) | CUTANEOUS | Status: DC | PRN
  Filled 2018-12-19: qty 222

## 2018-12-19 MED ORDER — SORBITOL 70 % SOLN
30.0000 mL | Status: DC | PRN
  Administered 2018-12-23 – 2018-12-24 (×3): 30 mL via ORAL
  Filled 2018-12-19 (×4): qty 30

## 2018-12-19 MED ORDER — MELATONIN 3 MG PO TABS
6.0000 mg | ORAL_TABLET | Freq: Every day | ORAL | Status: DC
  Administered 2018-12-19 – 2018-12-24 (×6): 6 mg via ORAL
  Filled 2018-12-19 (×8): qty 2

## 2018-12-19 MED ORDER — DOCUSATE SODIUM 283 MG RE ENEM
1.0000 | ENEMA | RECTAL | Status: DC | PRN
  Administered 2018-12-24: 283 mg via RECTAL
  Filled 2018-12-19 (×3): qty 1

## 2018-12-19 MED ORDER — NEPRO/CARBSTEADY PO LIQD
237.0000 mL | Freq: Three times a day (TID) | ORAL | Status: DC | PRN
  Filled 2018-12-19: qty 237

## 2018-12-19 NOTE — ED Triage Notes (Signed)
Per [facility] pt coming from [facility]. Patient sent out due to abnormal labs from his facility. Paperwork shows Creatinine on 6/9 4.39. Patient denies any pain.

## 2018-12-19 NOTE — ED Provider Notes (Signed)
[facility] EMERGENCY DEPARTMENT Provider Note   CSN: [id number] Arrival date & time: 12/19/18  1209    History   Chief Complaint Chief Complaint  Patient presents with  . Abnormal Lab    HPI Reginald [name] is a 83 y.o. male.     The history is provided by the patient. No language interpreter was used.  Abnormal Lab  Time since result:  Today Patient referred by:  Nursing home Result type: chemistry   Chemistry:    BUN:  High   Creatinine:  High Pt sent to Ed for evaluation of elevated bun and creatinine.  Pt has a history of CHF. Pt recently hospitalized with COVID on 5/7-5/18.    Past Medical History:  Diagnosis Date  . Anteroseptal myocardial infarction ([place])   . Atrial fibrillation (HCC)    a. chronic, on Xarelto  . CHF (congestive heart failure) ([place])   . Chronic kidney disease   . COPD (chronic obstructive pulmonary disease) ([place])   . Coronary atherosclerosis of unspecified type of vessel, native or graft    a. s/p prior PCI to LAD in 1997  . Diverticulosis   . Glaucoma   . Hypercholesteremia   . Hypertensive renal disease   . Hypokalemia   . Obesity hypoventilation syndrome ([place])   . Obesity, unspecified   . OSA (obstructive sleep apnea)   . Rhabdomyolysis   . Type II or unspecified type diabetes mellitus without mention of complication, not stated as uncontrolled   . Unspecified essential hypertension   . Vestibulopathy     Patient Active Problem List   Diagnosis Date Noted  . AKI (acute kidney injury) ([place]) 11/26/2018  . HCAP (healthcare-associated pneumonia) 11/15/2018  . Acute respiratory disease due to COVID-19 virus 11/15/2018  . Chronic venous insufficiency 10/04/2017  . Venous stasis ulcers of both lower extremities ([place]) 10/04/2017  . Nonspecific chest pain 10/27/2016  . Morbid obesity ([place]) 05/02/2016  . Lymphedema 05/02/2016  . Abnormal laboratory test result 10/15/2015  . Cough 10/08/2015  . Hematuria 09/21/2015   . Hypokalemia 08/13/2015  . HTN (hypertension) 08/13/2015  . Multiple open wounds of lower leg 07/22/2015  . CKD (chronic kidney disease) stage 4, GFR 15-29 ml/min (HCC) 07/22/2015  . Cellulitis of leg, right 06/28/2015  . ARF (acute renal failure) ([place]) 06/28/2015  . Hypertensive heart disease with CHF (congestive heart failure) ([place]) 05/19/2015  . CAD (coronary artery disease), native coronary artery 05/19/2015  . Hyperlipidemia 05/19/2015  . Vitamin D deficiency 05/19/2015  . Pressure ulcer 05/16/2015  . Diastolic dysfunction with chronic heart failure ([place]) 05/08/2015  . Blisters of multiple sites 05/08/2015  . Type 2 diabetes mellitus ([place])   . Obesity, unspecified   . OSA (obstructive sleep apnea)   . Atrial fibrillation ([place])   . Secondary DM with CKD stage 4 and hypertension ([place])   . Glaucoma   . Cellulitis 05/07/2015    Past Surgical History:  Procedure Laterality Date  . APPENDECTOMY  1985  . CARDIAC CATHETERIZATION    . CHOLECYSTECTOMY  06/2000  . CORONARY ANGIOPLASTY  08/14/1995   stent placement to LAD   . ENDOVENOUS ABLATION SAPHENOUS VEIN W/ LASER Left 02/21/2018   endovenous laser ablation L GSV by Ruta Hinds MD   . [place] Right 1985  . KNEE ARTHROSCOPY Left 1991  . LUMBAR FUSION    . NOSE SURGERY  1970s   Per Dr. Ter[facility] in pt chart  Home Medications    Prior to Admission medications   Medication Sig Start Date End Date Taking? Authorizing Provider  acetaminophen (TYLENOL) 325 MG tablet Take 650 mg by mouth every 4 (four) hours as needed for mild pain.    [provider]  amLODipine (NORVASC) 10 MG tablet Take 10 mg by mouth daily.    [provider]  atorvastatin (LIPITOR) 20 MG tablet Take 20 mg by mouth at bedtime.     [provider]  cetirizine (ZYRTEC) 10 MG tablet Take 10 mg by mouth at bedtime.    [provider]  cholecalciferol (VITAMIN D) 1000 UNITS tablet Take 1,000 Units  by mouth daily.    [provider]  docusate sodium (COLACE) 100 MG capsule Take 100 mg by mouth at bedtime.    [provider]  Dulaglutide (TRULICITY) 9.92 EQ/6.8TM SOPN Inject 0.5 mLs into the skin every Monday.    [provider]  escitalopram (LEXAPRO) 5 MG tablet Take 5 mg by mouth daily.    [provider]  gabapentin (NEURONTIN) 100 MG capsule Take 100 mg by mouth 3 (three) times daily.    [provider]  glipiZIDE (GLUCOTROL XL) 5 MG 24 hr tablet Take 5 mg by mouth daily with breakfast.    [provider]  latanoprost (XALATAN) 0.005 % ophthalmic solution Place 1 drop into both eyes at bedtime. 01/03/17   [provider]  Magnesium 400 MG TABS Take 400 mg by mouth daily.    [provider]  Melatonin 5 MG TABS Take 5 mg by mouth at bedtime.    [provider]  Menthol, Topical Analgesic, 4.5 % GEL Apply 1 application topically 2 (two) times daily as needed (pain). Apply to forehead and left arm    [provider]  metoprolol tartrate (LOPRESSOR) 25 MG tablet Take 1 tablet (25 mg total) by mouth 2 (two) times daily. Patient taking differently: Take 25 mg by mouth daily.  05/18/15   Charolette Forward, MD  nitroGLYCERIN (NITROSTAT) 0.4 MG SL tablet Place 0.4 mg under the tongue every 5 (five) minutes as needed for chest pain.    [provider]  Potassium Chloride ER 20 MEQ TBCR Take 20 mEq by mouth daily.    [provider]  silver sulfADIAZINE (SILVADENE) 1 % cream Apply 1 application topically every other day. Apply to bilateral lower legs prior to re-wrapping    [provider]  Skin Protectants, Misc. (EUCERIN) cream Apply 1 application topically 2 (two) times a day.    [provider]  torsemide (DEMADEX) 20 MG tablet Take 2 tablets (40 mg total) by mouth daily. 12/04/18   Charlynne Cousins, MD  TRADJENTA 5 MG TABS tablet Take 5 mg by mouth daily. 10/26/18    [provider]  vitamin B-12 (CYANOCOBALAMIN) 1000 MCG tablet Take 1,000 mcg by mouth daily.    [provider]  vitamin C (ASCORBIC ACID) 500 MG tablet Take 1,000 mg by mouth daily.    [provider]  XARELTO 15 MG TABS tablet Take 15 mg by mouth daily.  06/17/14   [provider]    Family History Family History  Problem Relation Age of Onset  . COPD Mother   . Heart disease Mother   . Diabetes Father   . Heart disease Father   . Diabetes Sister   . Heart disease Brother   . Heart disease Brother   . Breast cancer Maternal Aunt   .  Diabetes Paternal Grandmother   . Colon cancer Maternal Aunt   . Ovarian cancer Daughter     Social History Social History   Tobacco Use  . Smoking status: Never Smoker  . Smokeless tobacco: Never Used  Substance Use Topics  . Alcohol use: No    Alcohol/week: 0.0 standard drinks  . Drug use: No     Allergies   Adhesive [tape]   Review of Systems Review of Systems  All other systems reviewed and are negative.    Physical Exam Updated Vital Signs BP 130/71   Pulse 77   Temp 98 F (36.7 C) (Oral)   Resp 12   Ht 5\' 11"  (1.803 m)   Wt (!) 140.6 kg   SpO2 95%   BMI 43.24 kg/m   Physical Exam Vitals signs and nursing note reviewed.  Constitutional:      Appearance: He is well-developed.  HENT:     Head: Normocephalic.     Right Ear: Tympanic membrane normal.     Left Ear: Tympanic membrane normal.     Mouth/Throat:     Mouth: Mucous membranes are moist.  Eyes:     Pupils: Pupils are equal, round, and reactive to light.  Neck:     Musculoskeletal: Normal range of motion.  Cardiovascular:     Rate and Rhythm: Normal rate.  Pulmonary:     Effort: Pulmonary effort is normal.  Abdominal:     General: Abdomen is flat. There is no distension.  Musculoskeletal: Normal range of motion.  Skin:    General: Skin is warm.  Neurological:     Mental Status: He is alert and oriented to  person, place, and time.  Psychiatric:        Mood and Affect: Mood normal.      ED Treatments / Results  Labs (all labs ordered are listed, but only abnormal results are displayed) Labs Reviewed  CBC - Abnormal; Notable for the following components:      Result Value   RDW 18.9 (*)    All other components within normal limits  BASIC METABOLIC PANEL - Abnormal; Notable for the following components:   Potassium 2.8 (*)    Chloride 96 (*)    Glucose, Bld 191 (*)    BUN 90 (*)    Creatinine, Ser 4.74 (*)    Calcium 10.4 (*)    GFR calc non Af Amer 11 (*)    GFR calc Af Amer 12 (*)    All other components within normal limits    EKG None  Radiology No results found.  Procedures Procedures (including critical care time)  Medications Ordered in ED Medications  sodium chloride 0.9 % bolus 500 mL (has no administration in time range)     Initial Impression / Assessment and Plan / ED Course  I have reviewed the triage vital signs and the nursing notes.  Pertinent labs & imaging results that were available during my care of the patient were reviewed by me and considered in my medical decision making (see chart for details).        MDM  Pt's last creatinine was 2.21  BUN 88.  Creatinine today 4.74 BUN 90.   Final Clinical Impressions(s) / ED Diagnoses   Final diagnoses:  Acute kidney injury (AKI) with acute tubular necrosis (ATN) [facility])    ED Discharge Orders    None    Hospitalist called and will admit   Fransico Meadow, PA-C 12/19/18 1509  Blanchie Dessert, MD 12/21/18 2138

## 2018-12-19 NOTE — H&P (Signed)
History and Physical  Reginald [name] STM:[id number] DOB: Oct 10, 1935 DOA: 12/19/2018  PCP: Guadlupe Spanish, MD   Chief Complaint: Acute kidney injury.  HPI: The patient is a 83 yr old man who presents to the ED after he was found to have a creatinine of 4.39. He was discharged from this facility on 11/26/2018 after a stay for COVID-19. At discharge his creatinine was 2.21. Prior to that illness it appears that his creatinine was less than 2.0. During his stay he had a modest increase in creatinine due to diuresis. At discharge it was recommended that his torsemide be held for 1 week, and that he have a recheck of his chemistry before restarting. The patient denies any difficulty eating or drinking. He denies difficulty starting a stream of urine. He states that he feels well. No new problems.   He carries a past medical history significant for  Atrial fibrillation, CHF (EF 10/2016 was 15 - 60%), CKD, COPD, CAD, Hyperlipidemia, OHS, OSA, DM II, and hypertension.  The hospitalist service was consulted to admit the patient for further evaluation and treatment.  ED Course: In the ED the patient's temp was 98.3, HR was 72, Blood pressure was 140/83. He was saturating 98% on 2 liters. He was given a 500 cc bolus.   Review of Systems: Review of Systems - History obtained from the patient General ROS: The patient is elderly, weak, and frail. He is awake, alert, and oriented x 3. ENT ROS: negative for - epistaxis, headaches, nasal congestion, sneezing, sore throat or vertigo Respiratory ROS: no cough, shortness of breath, or wheezing Cardiovascular ROS: no chest pain or dyspnea on exertion Gastrointestinal ROS: no abdominal pain, change in bowel habits, or black or bloody stools Genito-Urinary ROS: no dysuria, trouble voiding, or hematuria Musculoskeletal ROS: negative for - gait disturbance, joint pain or muscular weakness Neurological ROS: no TIA or stroke symptoms Dermatological ROS: negative for  dry skin, mole changes and pruritus Negative for fever, visual changes, sore throat, rash, new muscle aches, chest pain, SOB, dysuria, bleeding, n/v/abdominal pain.  Past Medical History:  Diagnosis Date   Anteroseptal myocardial infarction [facility])    Atrial fibrillation ([place])    a. chronic, on Xarelto   CHF (congestive heart failure) (HCC)    Chronic kidney disease    COPD (chronic obstructive pulmonary disease) (HCC)    Coronary atherosclerosis of unspecified type of vessel, native or graft    a. s/p prior PCI to LAD in 1997   Diverticulosis    Glaucoma    Hypercholesteremia    Hypertensive renal disease    Hypokalemia    Obesity hypoventilation syndrome (HCC)    Obesity, unspecified    OSA (obstructive sleep apnea)    Rhabdomyolysis    Type II or unspecified type diabetes mellitus without mention of complication, not stated as uncontrolled    Unspecified essential hypertension    Vestibulopathy     Past Surgical History:  Procedure Laterality Date   [place]  06/2000   CORONARY ANGIOPLASTY  08/14/1995   stent placement to LAD    ENDOVENOUS ABLATION SAPHENOUS VEIN W/ LASER Left 02/21/2018   endovenous laser ablation L GSV by Ruta Hinds MD    INGUINAL HERNIA REPAIR Right 1985   KNEE ARTHROSCOPY Left 1991   LUMBAR FUSION     NOSE SURGERY  1970s   Per Dr. Terrence Dupont [facility] in pt chart     reports that  he has never smoked. He has never used smokeless tobacco. He reports that he does not drink alcohol or use drugs. Mobility: Walks with device, He is a moderate fall risk.  Allergies  Allergen Reactions   Adhesive [Tape] Rash    Family History  Problem Relation Age of Onset   COPD Mother    Heart disease Mother    Diabetes Father    Heart disease Father    Diabetes Sister    Heart disease Brother    Heart disease Brother    Breast cancer Maternal Aunt    Diabetes Paternal  Grandmother    Colon cancer Maternal Aunt    Ovarian cancer Daughter      Prior to Admission medications   Medication Sig Start Date End Date Taking? Authorizing Provider  acetaminophen (TYLENOL) 325 MG tablet Take 650 mg by mouth every 4 (four) hours as needed for mild pain.   Yes [provider]  amLODipine (NORVASC) 10 MG tablet Take 10 mg by mouth daily.   Yes [provider]  atorvastatin (LIPITOR) 20 MG tablet Take 20 mg by mouth at bedtime.    Yes [provider]  cetirizine (ZYRTEC) 10 MG tablet Take 10 mg by mouth at bedtime.   Yes [provider]  docusate sodium (COLACE) 100 MG capsule Take 100 mg by mouth at bedtime.   Yes [provider]  Dulaglutide (TRULICITY) 8.34 HD/6.2IW SOPN Inject 0.5 mLs into the skin every Monday.   Yes [provider]  escitalopram (LEXAPRO) 5 MG tablet Take 5 mg by mouth daily.   Yes [provider]  gabapentin (NEURONTIN) 100 MG capsule Take 100 mg by mouth 3 (three) times daily.   Yes [provider]  latanoprost (XALATAN) 0.005 % ophthalmic solution Place 1 drop into both eyes at bedtime. 01/03/17  Yes [provider]  Magnesium 400 MG TABS Take 400 mg by mouth daily.   Yes [provider]  Melatonin 5 MG TABS Take 5 mg by mouth at bedtime.   Yes [provider]  Menthol, Topical Analgesic, 4.5 % GEL Apply 1 application topically 2 (two) times daily as needed (pain). Apply to forehead and left arm   Yes [provider]  metoprolol succinate (TOPROL-XL) 25 MG 24 hr tablet Take 25 mg by mouth daily.   Yes [provider]  Multiple Vitamin (MULTIVITAMIN WITH MINERALS) TABS tablet Take 1 tablet by mouth every evening.   Yes [provider]  nitroGLYCERIN (NITROSTAT) 0.4 MG SL tablet Place 0.4 mg under the tongue every 5 (five) minutes as needed for chest pain.   Yes [provider]  phenylephrine-shark liver oil-mineral  oil-petrolatum (PREPARATION H) 0.25-3-14-71.9 % rectal ointment Place 1 application rectally 3 (three) times daily.   Yes [provider]  Potassium Chloride ER 20 MEQ TBCR Take 20 mEq by mouth daily.   Yes [provider]  Skin Protectants, Misc. (EUCERIN) cream Apply 1 application topically 2 (two) times a day.   Yes [provider]  torsemide (DEMADEX) 20 MG tablet Take 2 tablets (40 mg total) by mouth daily. 12/04/18  Yes Charlynne Cousins, MD  vitamin B-12 (CYANOCOBALAMIN) 1000 MCG tablet Take 1,000 mcg by mouth daily.   Yes [provider]  XARELTO 15 MG TABS tablet Take 15 mg by mouth daily with supper.  06/17/14  Yes [provider]  metoprolol tartrate (LOPRESSOR) 25 MG tablet Take 1 tablet (25 mg total) by mouth 2 (two)  times daily. Patient not taking: Reported on 12/19/2018 05/18/15   Charolette Forward, MD    Physical Exam: Vitals:   12/19/18 1642 12/19/18 1957  BP: 139/72 129/83  Pulse: 64 (!) 188  Resp: 19 19  Temp:  98.3 F (36.8 C)  SpO2: 98% 98%    Constitutional:    The patient is elderly, weak, and frail. He is no acute distress. Eyes:   PERRLA, EOMBI,   No scleral icterus or injection. ENMT:   grossly normal hearing   Lips appear normal  external ears, nose appear normal  Oropharynx: mucosa, tongue,posterior pharynx appear normal Neck:   neck appears normal, no masses, normal ROM, supple  no thyromegaly Respiratory:   No wheezes, rales, or rhonchi.   No tactile fremitus.  No increased work of breathing. Cardiovascular:   Regular rate and rhythm.  No murmur, ectopy, or gallups.  No LE extremity edema    Normal pedal pulses Abdomen:   Abdomen is soft, non-tender, non-distended.  No hernias, masses, or organomegaly are appreciated.  Normoactive bowel sounds. Musculoskeletal:   Digits/nails BUE: no clubbing, cyanosis, petechiae, infection  exam of joints, bones, muscles of at least one of  following: head/neck, RUE, LUE, RLE, LLE   o strength and tone normal, no atrophy, no abnormal movements o No tenderness, masses o Normal ROM, no contractures  Skin:   No rashes, lesions, ulcers  palpation of skin: no induration or nodules Neurologic:   CN 2-12 intact  Sensation all 4 extremities intact Psychiatric:   Mental status o Mood, affect appropriate o Orientation to person, place, time   judgment and insight appear intact   I have personally reviewed following labs and imaging studies  Labs:   CBC, BMP     Review and summation of old records:   Prior admission reviewed.  Active Problems:   Acute on chronic renal failure (HCC)   Assessment/Plan Problem  Acute Renal Failure Superimposed On Stage 4 Chronic Kidney Disease (Hcc)  Hypokalemia  Htn (Hypertension)  Cad (Coronary Artery Disease), Native Coronary Artery  Hyperlipidemia  Type 2 Diabetes Mellitus (Hcc)  Osa (Obstructive Sleep Apnea)  Atrial Fibrillation (Hcc)  Secondary DM With Ckd Stage 4 and Hypertension (Hcc)   Acute on chronic stage 4 kidney disease: Likely due to over diuresis, however, renal ultrasound has been ordered.  Will hold diuretics and gently give IV fluids. Follow creatinine, electrolytes, and volume status. Would consult nephrology if patient does not demonstrate good improvement.  CHF: Pt is receiving 40 mg of torsemide at home. This will be held. It is not clear if the patient had the follow up chemistry one week after discharge as was recommended. It does not appear that this was the case. Will check an echocardiogram. The most recent echo was performed on 10/28/2016. It demonstrated an EF of 55 - 60%.   Hypokalemia: Supplement cautiously and follow. Takes 20 MeQ of potassium at home daily. This has been held due to renal insufficiency.  Atrial fibrillation: Rate is controlled. I have stopped xarelto which he used for stroke prophylaxis as his GFR is 11.  DM II: Will follow  glucoses with FSBS and SSI. Hold trulicity. Diabetic diet.  COPD: Appears stable. As needed nebulizer treatments are available.  CAD: Continue his home use of metoprolol. Statin is held due to renal insufficiency.  Hypertension: Continue metoprolol and norvasc.  I have seen and examined this patient myself. I have spent 78 minutes in his evaluation and care.  Severity of  Illness: The appropriate patient status for this patient is INPATIENT. Inpatient status is judged to be reasonable and necessary in order to provide the required intensity of service to ensure the patient's safety. The patient's presenting symptoms, physical exam findings, and initial radiographic and laboratory data in the context of their chronic comorbidities is felt to place them at high risk for further clinical deterioration. Furthermore, it is not anticipated that the patient will be medically stable for discharge from the hospital within 2 midnights of admission. The following factors support the patient status of inpatient.   " The patient's presenting symptoms include Elevated creatinine. " The worrisome physical exam findings include . " The initial radiographic and laboratory data are worrisome because of elevated creatinine, low potassium. " The chronic co-morbidities include CHF, DM II, HTN, CAD, Atrial fibrillaion, .   * I certify that at the point of admission it is my clinical judgment that the patient will require inpatient hospital care spanning beyond 2 midnights from the point of admission due to high intensity of service, high risk for further deterioration and high frequency of surveillance required.*  ELOS: 3-5 days DVT prophylaxis:Heparin Code Status: Full Code  Time spent: 19 minutes  Shericka Johnstone, DO [facility] Direct contact: see [email or web address]  7PM-7AM contact night coverage as above  12/19/2018, 8:17 PM

## 2018-12-19 NOTE — ED Notes (Signed)
ED TO INPATIENT HANDOFF REPORT  ED Nurse Name and Phone #: Joellen Jersey [phone]  S Name/Age/Gender Reginald [name] 83 y.o. male Room/Bed: 017C/017C  Code Status   Code Status: Full Code  Home/SNF/Other Nursing Home [name] oriented to: self, place, time and situation Is this baseline? Yes   Triage Complete: Triage complete  Reginald [name]  Triage Note Per [facility] pt coming from [facility]. [name] sent out due to abnormal labs from his facility. Paperwork shows Creatinine on 6/9 4.39. [name] denies any pain.    Allergies Allergies  Allergen Reactions  . Adhesive [Tape] Rash    Level of Care/Admitting Diagnosis ED Disposition    ED Disposition Condition [place] [facility] Area: [place] [[id number]]  Level of Care: Telemetry Medical [104]  Covid Evaluation: N/A  Diagnosis: Acute on chronic renal failure [facility]) [[id number]]  Admitting Physician: Monna Fam  Attending Physician: Benny Lennert, AVA [place]  Estimated length of stay: 3 - 4 days  Certification:: I certify this [name] will need inpatient services for at least 2 midnights  PT Class (Do Not Modify): Inpatient [101]  PT Acc Code (Do Not Modify): Private [1]       B Medical/Surgery History Past Medical History:  Diagnosis Date  . Anteroseptal myocardial infarction ([place])   . Atrial fibrillation (HCC)    a. chronic, on Xarelto  . CHF (congestive heart failure) ([place])   . Chronic kidney disease   . COPD (chronic obstructive pulmonary disease) ([place])   . Coronary atherosclerosis of unspecified type of vessel, native or graft    a. s/p prior PCI to LAD in 1997  . Diverticulosis   . Glaucoma   . Hypercholesteremia   . Hypertensive renal disease   . Hypokalemia   . Obesity hypoventilation syndrome ([place])   . Obesity, unspecified   . OSA (obstructive sleep apnea)   . Rhabdomyolysis   . Type II or unspecified type diabetes mellitus without mention of complication, not  stated as uncontrolled   . Unspecified essential hypertension   . Vestibulopathy    Past Surgical History:  Procedure Laterality Date  . APPENDECTOMY  1985  . CARDIAC CATHETERIZATION    . CHOLECYSTECTOMY  06/2000  . CORONARY ANGIOPLASTY  08/14/1995   stent placement to LAD   . ENDOVENOUS ABLATION SAPHENOUS VEIN W/ LASER Left 02/21/2018   endovenous laser ablation L GSV by Ruta Hinds MD   . [place] Right 1985  . KNEE ARTHROSCOPY Left 1991  . LUMBAR FUSION    . NOSE SURGERY  1970s   Per Dr. Terrence Dupont [facility] in pt chart     A IV Location/Drains/Wounds [name] Lines/Drains/Airways Status   Active Line/Drains/Airways    Name:   Placement date:   Placement time:   Site:   Days:   Peripheral IV 12/19/18 Right Forearm   12/19/18    1219    Forearm   less than 1   Pressure Ulcer 05/15/15 Stage II -  Partial thickness loss of dermis presenting as a shallow open ulcer with a red, pink wound bed without slough. red area    05/15/15    1940     1314   Wound / Incision (Open or Dehisced) 11/16/18 Venous stasis ulcer Pretibial Left   11/16/18    0900    Pretibial   33   Wound / Incision (Open or Dehisced) 11/16/18 Venous stasis ulcer Pretibial Right   11/16/18    0900  Pretibial   33          Intake/Output Last 24 hours  Intake/Output Summary (Last 24 hours) at 12/19/2018 1700 Last data filed at 12/19/2018 1649 Gross per 24 hour  Intake 500 ml  Output -  Net 500 ml    Labs/Imaging Results for orders placed or performed during the hospital encounter of 12/19/18 (from the past 48 hour(s))  CBC     Status: Abnormal   Collection Time: 12/19/18 12:21 PM  Result Value Ref Range   WBC 5.1 4.0 - 10.5 K/uL   RBC 4.71 4.22 - 5.81 MIL/uL   Hemoglobin 13.2 13.0 - 17.0 g/dL   HCT 42.0 39.0 - 52.0 %   MCV 89.2 80.0 - 100.0 fL   MCH 28.0 26.0 - 34.0 pg   MCHC 31.4 30.0 - 36.0 g/dL   RDW 18.9 (H) 11.5 - 15.5 %   Platelets 215 150 - 400 K/uL   nRBC 0.0 0.0 - 0.2 %    Comment:  Performed at [facility], [place] [address] [place], [place] [address]  Basic metabolic panel     Status: Abnormal   Collection Time: 12/19/18 12:21 PM  Result Value Ref Range   Sodium 141 135 - 145 mmol/L   Potassium 2.8 (L) 3.5 - 5.1 mmol/L   Chloride 96 (L) 98 - 111 mmol/L   CO2 30 22 - 32 mmol/L   Glucose, Bld 191 (H) 70 - 99 mg/dL   BUN 90 (H) 8 - 23 mg/dL   Creatinine, Ser 4.74 (H) 0.61 - 1.24 mg/dL   Calcium 10.4 (H) 8.9 - 10.3 mg/dL   GFR calc non Af Amer 11 (L) >60 mL/min   GFR calc Af Amer 12 (L) >60 mL/min   Anion gap 15 5 - 15    Comment: Performed at [facility] [address] [place], [place] [address]   No results found.  Pending Labs Unresulted Labs (From admission, onward)    Start     Ordered   12/20/18 0500  Comprehensive metabolic panel  Tomorrow morning,   R     12/19/18 1513   12/20/18 0500  CBC  Tomorrow morning,   R     12/19/18 1513          Vitals/Pain Today's Vitals   12/19/18 1218 12/19/18 1400 12/19/18 1500 12/19/18 1642  BP:  125/67 108/65 139/72  Pulse:  87 70 64  Resp:  18 13 19   Temp:      TempSrc:      SpO2: 95% 97% 97% 98%  Weight:      Height:      PainSc:        Isolation Precautions No active isolations  Medications Medications  acetaminophen (TYLENOL) tablet 650 mg (has no administration in time range)    Or  acetaminophen (TYLENOL) suppository 650 mg (has no administration in time range)  zolpidem (AMBIEN) tablet 5 mg (has no administration in time range)  camphor-menthol (SARNA) lotion 1 application (has no administration in time range)    And  hydrOXYzine (ATARAX/VISTARIL) tablet 25 mg (has no administration in time range)  sorbitol 70 % solution 30 mL (has no administration in time range)  docusate sodium (ENEMEEZ) enema 283 mg (has no administration in time range)  ondansetron (ZOFRAN) tablet 4 mg (has no administration in time range)    Or  ondansetron (ZOFRAN) injection 4 mg (has no administration  in time range)  calcium carbonate (  dosed in mg elemental calcium) suspension 500 mg of elemental calcium (has no administration in time range)  feeding supplement (NEPRO CARB STEADY) liquid 237 mL (has no administration in time range)  heparin injection 5,000 Units (has no administration in time range)  0.9 %  sodium chloride infusion (has no administration in time range)  sodium chloride 0.9 % bolus 500 mL (0 mLs Intravenous Stopped 12/19/18 1649)    Mobility walks with device Moderate fall risk      R Recommendations: See Admitting Provider Note  Report given to:   Additional Notes:

## 2018-12-20 ENCOUNTER — Inpatient Hospital Stay (HOSPITAL_COMMUNITY): Payer: Medicare Other

## 2018-12-20 ENCOUNTER — Other Ambulatory Visit: Payer: Self-pay

## 2018-12-20 DIAGNOSIS — I361 Nonrheumatic tricuspid (valve) insufficiency: Secondary | ICD-10-CM

## 2018-12-20 LAB — PROTEIN / CREATININE RATIO, URINE
Creatinine, Urine: 64.71 mg/dL
Total Protein, Urine: <6 mg/dL

## 2018-12-20 LAB — COMPREHENSIVE METABOLIC PANEL
ALT: 29 U/L (ref 0–44)
AST: 28 U/L (ref 15–41)
Albumin: 3.2 g/dL — ABNORMAL LOW (ref 3.5–5.0)
Alkaline Phosphatase: 49 U/L (ref 38–126)
Anion gap: 15 (ref 5–15)
BUN: 89 mg/dL — ABNORMAL HIGH (ref 8–23)
CO2: 29 mmol/L (ref 22–32)
Calcium: 9.9 mg/dL (ref 8.9–10.3)
Chloride: 96 mmol/L — ABNORMAL LOW (ref 98–111)
Creatinine, Ser: 4.4 mg/dL — ABNORMAL HIGH (ref 0.61–1.24)
GFR calc Af Amer: 14 mL/min — ABNORMAL LOW (ref >60)
GFR calc non Af Amer: 12 mL/min — ABNORMAL LOW (ref >60)
Glucose, Bld: 148 mg/dL — ABNORMAL HIGH (ref 70–99)
Potassium: 3.1 mmol/L — ABNORMAL LOW (ref 3.5–5.1)
Sodium: 140 mmol/L (ref 135–145)
Total Bilirubin: 0.5 mg/dL (ref 0.3–1.2)
Total Protein: 5.4 g/dL — ABNORMAL LOW (ref 6.5–8.1)

## 2018-12-20 LAB — ECHOCARDIOGRAM LIMITED
Height: 71 in
Weight: 4981.34 oz

## 2018-12-20 LAB — CBC
HCT: 38.9 % — ABNORMAL LOW (ref 39.0–52.0)
Hemoglobin: 12.4 g/dL — ABNORMAL LOW (ref 13.0–17.0)
MCH: 28.1 pg (ref 26.0–34.0)
MCHC: 31.9 g/dL (ref 30.0–36.0)
MCV: 88 fL (ref 80.0–100.0)
Platelets: 207 10*3/uL (ref 150–400)
RBC: 4.42 MIL/uL (ref 4.22–5.81)
RDW: 18.6 % — ABNORMAL HIGH (ref 11.5–15.5)
WBC: 4 10*3/uL (ref 4.0–10.5)
nRBC: 0 % (ref 0.0–0.2)

## 2018-12-20 LAB — URINALYSIS, ROUTINE W REFLEX MICROSCOPIC
Bilirubin Urine: NEGATIVE
Glucose, UA: NEGATIVE mg/dL
Hgb urine dipstick: NEGATIVE
Ketones, ur: NEGATIVE mg/dL
Leukocytes,Ua: NEGATIVE
Nitrite: NEGATIVE
Protein, ur: NEGATIVE mg/dL
Specific Gravity, Urine: 1.008 (ref 1.005–1.030)
pH: 5 (ref 5.0–8.0)

## 2018-12-20 LAB — GLUCOSE, CAPILLARY
Glucose-Capillary: 142 mg/dL — ABNORMAL HIGH (ref 70–99)
Glucose-Capillary: 152 mg/dL — ABNORMAL HIGH (ref 70–99)
Glucose-Capillary: 155 mg/dL — ABNORMAL HIGH (ref 70–99)
Glucose-Capillary: 163 mg/dL — ABNORMAL HIGH (ref 70–99)
Glucose-Capillary: 176 mg/dL — ABNORMAL HIGH (ref 70–99)

## 2018-12-20 LAB — SODIUM, URINE, RANDOM: Sodium, Ur: 37 mmol/L

## 2018-12-20 LAB — APTT: aPTT: 39 seconds — ABNORMAL HIGH (ref 24–36)

## 2018-12-20 LAB — HEPARIN LEVEL (UNFRACTIONATED): Heparin Unfractionated: 0.53 IU/mL (ref 0.30–0.70)

## 2018-12-20 MED ORDER — INSULIN GLARGINE 100 UNIT/ML ~~LOC~~ SOLN
5.0000 [IU] | Freq: Every day | SUBCUTANEOUS | Status: DC
  Administered 2018-12-21 – 2018-12-24 (×5): 5 [IU] via SUBCUTANEOUS
  Filled 2018-12-20 (×8): qty 0.05

## 2018-12-20 MED ORDER — HEPARIN BOLUS VIA INFUSION
5000.0000 [IU] | Freq: Once | INTRAVENOUS | Status: AC
  Administered 2018-12-20: 5000 [IU] via INTRAVENOUS
  Filled 2018-12-20: qty 5000

## 2018-12-20 MED ORDER — INSULIN ASPART 100 UNIT/ML ~~LOC~~ SOLN: 0.0000 [IU] | Freq: Every day | SUBCUTANEOUS | Status: DC

## 2018-12-20 MED ORDER — HEPARIN (PORCINE) 25000 UT/250ML-% IV SOLN
1500.0000 [IU]/h | INTRAVENOUS | Status: DC
  Administered 2018-12-20: 1500 [IU]/h via INTRAVENOUS
  Filled 2018-12-20: qty 250

## 2018-12-20 MED ORDER — INSULIN ASPART 100 UNIT/ML ~~LOC~~ SOLN
0.0000 [IU] | Freq: Three times a day (TID) | SUBCUTANEOUS | Status: DC
  Administered 2018-12-20 (×2): 4 [IU] via SUBCUTANEOUS
  Administered 2018-12-21 (×2): 3 [IU] via SUBCUTANEOUS
  Administered 2018-12-21: 4 [IU] via SUBCUTANEOUS
  Administered 2018-12-22: 3 [IU] via SUBCUTANEOUS
  Administered 2018-12-22 (×2): 4 [IU] via SUBCUTANEOUS
  Administered 2018-12-23: 3 [IU] via SUBCUTANEOUS
  Administered 2018-12-23 – 2018-12-24 (×3): 4 [IU] via SUBCUTANEOUS
  Administered 2018-12-24 – 2018-12-25 (×2): 3 [IU] via SUBCUTANEOUS
  Administered 2018-12-25: 4 [IU] via SUBCUTANEOUS

## 2018-12-20 MED ORDER — POLYETHYLENE GLYCOL 3350 17 G PO PACK
17.0000 g | PACK | Freq: Every day | ORAL | Status: DC
  Administered 2018-12-20 – 2018-12-25 (×6): 17 g via ORAL
  Filled 2018-12-20 (×6): qty 1

## 2018-12-20 MED ORDER — INSULIN GLARGINE 100 UNIT/ML ~~LOC~~ SOLN
5.0000 [IU] | SUBCUTANEOUS | Status: AC
  Administered 2018-12-20: 5 [IU] via SUBCUTANEOUS
  Filled 2018-12-20: qty 0.05

## 2018-12-20 MED ORDER — APIXABAN 2.5 MG PO TABS
2.5000 mg | ORAL_TABLET | Freq: Two times a day (BID) | ORAL | Status: DC
  Administered 2018-12-20 – 2018-12-25 (×10): 2.5 mg via ORAL
  Filled 2018-12-20 (×11): qty 1

## 2018-12-20 MED ORDER — CHLORHEXIDINE GLUCONATE CLOTH 2 % EX PADS
6.0000 | MEDICATED_PAD | Freq: Every day | CUTANEOUS | Status: DC
  Administered 2018-12-20 – 2018-12-25 (×5): 6 via TOPICAL

## 2018-12-20 MED ORDER — POTASSIUM CHLORIDE CRYS ER 20 MEQ PO TBCR
40.0000 meq | EXTENDED_RELEASE_TABLET | Freq: Once | ORAL | Status: AC
  Administered 2018-12-20: 40 meq via ORAL
  Filled 2018-12-20: qty 2

## 2018-12-20 NOTE — Progress Notes (Signed)
RN noted patient's HR dropping in the 30's and 40's briefly. He is asymptomatic. [facility] made aware. Orders were given to continue to monitor and page provider if patient become symptomatic.

## 2018-12-20 NOTE — Progress Notes (Addendum)
Inpatient Diabetes Program Recommendations  AACE/ADA: New Consensus Statement on Inpatient Glycemic Control (2015)  Target Ranges:  Prepandial:   less than 140 mg/dL      Peak postprandial:   less than 180 mg/dL (1-2 hours)      Critically ill patients:  140 - 180 mg/dL   Lab Results  Component Value Date   GLUCAP 152 (H) 12/20/2018   HGBA1C 8.3 (H) 11/16/2018    Review of Glycemic Control Results for Reginald [name], Reginald [name] (MRN [id number]) as of 12/20/2018 11:10  Ref. Range 12/20/2018 01:15 12/20/2018 04:25  Glucose-Capillary Latest Ref Range: 70 - 99 mg/dL 155 (H) 152 (H)   Diabetes history: DM 2 Outpatient Diabetes medications:  Trulicity 0.5 mls q Monday,  Current orders for Inpatient glycemic control:  Lantus 5 units q HS Novolog resistant tid with meals and HS Inpatient Diabetes Program Recommendations:   Referral received.  Blood sugars currently within hospital goals.  Please consider reducing Novolog correction to moderate tid with meals (due to renal function).    Thanks, Adah Perl, RN, BC-ADM Inpatient Diabetes Coordinator Pager [phone] (8a-5p)

## 2018-12-20 NOTE — Progress Notes (Signed)
Pt received bed placement, will be transferring to 2W28.  Report called to Ronalee Belts, Therapist, sports.

## 2018-12-20 NOTE — Progress Notes (Signed)
ANTICOAGULATION CONSULT NOTE - Initial Consult  Pharmacy Consult for heparin Indication: atrial fibrillation  Allergies  Allergen Reactions  . Adhesive [Tape] Rash    Patient Measurements: Height: 5\' 11"  (180.3 cm) Weight: (!) 311 lb 5.3 oz (141.2 kg) IBW/kg (Calculated) : 75.3 Heparin Dosing Weight: 108.1 kg  Vital Signs: Temp: 97.6 F (36.4 C) (06/11 1030) Temp Source: Oral (06/11 1030) BP: 114/54 (06/11 1030) Pulse Rate: 71 (06/11 1030)  Labs: Recent Labs    12/19/18 1221 12/20/18 0414  HGB 13.2 12.4*  HCT 42.0 38.9*  PLT 215 207  CREATININE 4.74* 4.40*    Estimated Creatinine Clearance: 19.3 mL/min (A) (by C-G formula based on SCr of 4.4 mg/dL (H)).   Medical History: Past Medical History:  Diagnosis Date  . Anteroseptal myocardial infarction ([place])   . Atrial fibrillation (HCC)    a. chronic, on Xarelto  . CHF (congestive heart failure) ([place])   . Chronic kidney disease   . COPD (chronic obstructive pulmonary disease) ([place])   . Coronary atherosclerosis of unspecified type of vessel, native or graft    a. s/p prior PCI to LAD in 1997  . Diverticulosis   . Glaucoma   . Hypercholesteremia   . Hypertensive renal disease   . Hypokalemia   . Obesity hypoventilation syndrome ([place])   . Obesity, unspecified   . OSA (obstructive sleep apnea)   . Rhabdomyolysis   . Type II or unspecified type diabetes mellitus without mention of complication, not stated as uncontrolled   . Unspecified essential hypertension   . Vestibulopathy     Medications:  Scheduled:  . Chlorhexidine Gluconate Cloth  6 each Topical Q0600  . docusate sodium  100 mg Oral QHS  . escitalopram  5 mg Oral Daily  . gabapentin  100 mg Oral TID  . hydrocerin   Topical BID  . hydrocortisone   Rectal TID  . insulin aspart  0-20 Units Subcutaneous TID WC  . insulin aspart  0-5 Units Subcutaneous QHS  . insulin glargine  5 Units Subcutaneous QHS  . latanoprost  1 drop Both Eyes QHS  .  loratadine  10 mg Oral Daily  . Melatonin  6 mg Oral QHS  . multivitamin with minerals  1 tablet Oral QPM  . vitamin B-12  1,000 mcg Oral Daily   Infusions:  . sodium chloride 75 mL/hr at 12/20/18 3736    Assessment: 83 yo M on Xarelto 15 mg daily PTA for afib. Pharmacy has been consulted to initiate heparin while Xarelto is on hold for possible procedures. Last dose of Xarelto was 6/9 @ 0553.  Patient received subcutaneous heparin 5,000 units last on 6/11 at 0620.  Hgb 12.4, pltc 207  Goal of Therapy:  Heparin level 0.3-0.7 units/ml aPTT 66-102 seconds Monitor platelets by anticoagulation protocol: Yes   Plan:  Check baseline heparin level and aPTT prior to starting heparin infusion Heparin bolus 5,000 units x 1 Start heparin infusion at 1,500 units/hr Check aPTT and heparin level 8 hours after starting heparin Daily heparin level, aPTT, and CBC while on heparin Monitor for signs/symptoms of bleeding  Vertis Kelch, PharmD PGY1 Pharmacy Resident Phone [phone] 12/20/2018       11:09 AM

## 2018-12-20 NOTE — Progress Notes (Signed)
  Echocardiogram 2D Echocardiogram limited has been performed.  Darlina Sicilian M 12/20/2018, 8:46 AM

## 2018-12-20 NOTE — Progress Notes (Signed)
ANTICOAGULATION CONSULT NOTE - Initial Consult  Pharmacy Consult for Eliquis Indication: atrial fibrillation  Allergies  Allergen Reactions  . Adhesive [Tape] Rash    Patient Measurements: Height: 5\' 11"  (180.3 cm) Weight: (!) 311 lb 5.3 oz (141.2 kg) IBW/kg (Calculated) : 75.3  Vital Signs: Temp: 98.2 F (36.8 C) (06/11 1259) Temp Source: Oral (06/11 1259) BP: 138/65 (06/11 1200) Pulse Rate: 37 (06/11 1100)  Labs: Recent Labs    12/19/18 1221 12/20/18 0414 12/20/18 1133  HGB 13.2 12.4*  --   HCT 42.0 38.9*  --   PLT 215 207  --   APTT  --   --  39*  HEPARINUNFRC  --   --  0.53  CREATININE 4.74* 4.40*  --     Estimated Creatinine Clearance: 19.3 mL/min (A) (by C-G formula based on SCr of 4.4 mg/dL (H)).   Medical History: Past Medical History:  Diagnosis Date  . Anteroseptal myocardial infarction ([place])   . Atrial fibrillation (HCC)    a. chronic, on Xarelto  . CHF (congestive heart failure) ([place])   . Chronic kidney disease   . COPD (chronic obstructive pulmonary disease) ([place])   . Coronary atherosclerosis of unspecified type of vessel, native or graft    a. s/p prior PCI to LAD in 1997  . Diverticulosis   . Glaucoma   . Hypercholesteremia   . Hypertensive renal disease   . Hypokalemia   . Obesity hypoventilation syndrome ([place])   . Obesity, unspecified   . OSA (obstructive sleep apnea)   . Rhabdomyolysis   . Type II or unspecified type diabetes mellitus without mention of complication, not stated as uncontrolled   . Unspecified essential hypertension   . Vestibulopathy     Assessment: CC/HPI: AKI  PMH: afib on xarelto, HFpEF, CKD, COPD, CAD, HLD, OSA, DM, HTN  Significant events: discharged from the facility 11/26/2018 after a diagnosis of COVID-19 infection  Goal of Therapy:  Therapeutic oral anticoagulation   Plan:  Xarelto d/c'd due to renal function by Dr. Benny Lennert. D/c IV heparin started earlier today. -- PM: Start Eliquis 2.5mg  BID for  age >34 and Scr >1.5.   Mostyn Varnell S. Alford Highland, PharmD, BCPS Clinical Staff Pharmacist Eilene Ghazi Stillinger 12/20/2018,7:34 PM

## 2018-12-20 NOTE — Progress Notes (Addendum)
PROGRESS NOTE  Reginald [name] TIW:[id number] DOB: March 24, 1936 DOA: 12/19/2018 PCP: Guadlupe Spanish, MD  HPI/Recap of past 24 hours: The patient is a 83 yr old man who presents to the ED after he was found to have a creatinine of 4.39. He was discharged from this facility on 11/26/2018 after a stay for COVID-19. At discharge his creatinine was 2.21. Prior to that illness it appears that his creatinine was less than 2.0. During his stay he had a modest increase in creatinine due to diuresis. At discharge it was recommended that his torsemide be held for 1 week, and that he have a recheck of his chemistry before restarting. The patient denies any difficulty eating or drinking. He denies difficulty starting a stream of urine. He states that he feels well. No new problems.   He carries a past medical history significant for  Atrial fibrillation, CHF (EF 10/2016 was 29 - 60%), CKD, COPD, CAD, Hyperlipidemia, OHS, OSA, DM II, and hypertension.  The hospitalist service was consulted to admit the patient for further evaluation and treatment.  ED Course: In the ED the patient's temp was 98.3, HR was 72, Blood pressure was 140/83. He was saturating 98% on 2 liters. He was given a 500 cc bolus.   12/20/18: Patient seen and examined at his bedside.  Denies dyspnea but cough on exam.  Creatinine improving however renal ultrasound showed bilateral hydronephrosis.  Will obtain a bladder scan to rule out urinary retention.  Assessment/Plan: Principal Problem:   Acute renal failure superimposed on stage 4 chronic kidney disease (HCC) Active Problems:   Type 2 diabetes mellitus (HCC)   OSA (obstructive sleep apnea)   Atrial fibrillation (HCC)   CAD (coronary artery disease), native coronary artery   Hyperlipidemia   Hypokalemia   HTN (hypertension)   Acute on chronic renal failure (HCC)  AKI on CKD 3 Baseline creatinine appears to be 1.8 with GFR 33 Presented with creatinine of 4.74 and GFR of 11  Creatinine improving to 4.40 this morning Renal ultrasound showed bilateral hydronephrosis We will obtain bladder scan to rule out urinary retention Avoid nephrotoxins Monitor urine output, reported at 655 cc in the last 24 hours Repeat BMP in the morning Nephrology consult  Recent COVID-19 infection Recently admitted and discharged 11/26/2018 with positive COVID-19 test Independently reviewed chest x-ray from last admission which showed bilateral infiltrates suggestive of COVID-19 infection Cough noted on exam Diffuse rales noted on physical exam We will obtain a chest x-ray today Repeat COVID-19 test done on 12/19/2018+ Transferred to 2 W. pulmonary COVID-19 floor. Maintain O2 saturation greater than 92% Albuterol inhaler 3 times daily We will treat with Remdesivir if he meets criteria  Intermittent bradycardia in the setting of A. Fib Intermittent slow ventricular response Continue to monitor on telemetry 2D echo completed this morning, awaiting results  Permanent A. Fib Holding beta-blocker due to intermittent bradycardia On Xarelto previously which is held until no planned procedures Procedure and likely at this point due to improving renal function We will switch Xarelto to Eliquis due to renal insufficiency  Chronic diastolic CHF Last 2D echo done in 2018 showed LVEF 55 to 60% Continue to hold torsemide due to AKI Currently on IV fluids 75 cc/h normal saline Monitor fluid status Obtain daily weights and I's and O's  Hypokalemia Presented with potassium of 2.8 Improving to 3.0 Replete cautiously due to AKI Repeat BMP in the morning  Type 2 diabetes with complication CKD Continue insulin sliding scale Last  hemoglobin A1c was 8.3 on 11/16/18  COPD: Appears stable. As needed nebulizer treatments are available.  CAD: Continue his home use of metoprolol. Statin is held due to renal insufficiency.  Hypertension: Continue metoprolol and norvasc.  Risks: High risk  for decompensation due to AKI on CKD 3, positive COVID-19 test, multiple comorbidities and advanced age.  Patient will require at least 2 midnights for further evaluation and treatment of present condition.    Code Status: Full code  Family Communication: We will call family if is okay with the patient  Disposition Plan: Home possibly in 1-2 days when hemodynamically stable.   Consultants:  Nephrology, Dr. Augustin Coupe.  Procedures:  None  Antimicrobials:  None  DVT prophylaxis: Subcu heparin 3 times daily   Objective: Vitals:   12/20/18 0600 12/20/18 0700 12/20/18 0800 12/20/18 0900  BP: 108/65 (!) 119/58 127/75 (!) 139/122  Pulse: (!) 38 (!) 48 67 (!) 56  Resp: 12 12 17 16   Temp:      TempSrc:      SpO2: 96% 98% 99% 100%  Weight:      Height:        Intake/Output Summary (Last 24 hours) at 12/20/2018 0954 Last data filed at 12/20/2018 0500 Gross per 24 hour  Intake 1490 ml  Output 655 ml  Net 835 ml   Filed Weights   12/19/18 1215 12/20/18 0446  Weight: (!) 140.6 kg (!) 141.2 kg    Exam:  . General: 83 y.o. year-old male well developed well nourished in no acute distress.  Alert and oriented x3. . Cardiovascular: Irregular rate and rhythm with no rubs or gallops.  No thyromegaly or JVD noted.   Marland Kitchen Respiratory: Diffuse rales bilaterally with no wheezes noted.  Poor inspiratory effort. . Abdomen: Soft nontender nondistended with normal bowel sounds x4 quadrants. . Musculoskeletal: Lymphedema noted in lower extremities bilaterally.  Lower extremities wrapped in compression stockings. . Psychiatry: Mood is appropriate for condition and setting   Data Reviewed: CBC: Recent Labs  Lab 12/19/18 1221 12/20/18 0414  WBC 5.1 4.0  HGB 13.2 12.4*  HCT 42.0 38.9*  MCV 89.2 88.0  PLT 215 035   Basic Metabolic Panel: Recent Labs  Lab 12/19/18 1221 12/20/18 0414  NA 141 140  K 2.8* 3.1*  CL 96* 96*  CO2 30 29  GLUCOSE 191* 148*  BUN 90* 89*  CREATININE 4.74*  4.40*  CALCIUM 10.4* 9.9   GFR: Estimated Creatinine Clearance: 19.3 mL/min (A) (by C-G formula based on SCr of 4.4 mg/dL (H)). Liver Function Tests: Recent Labs  Lab 12/20/18 0414  AST 28  ALT 29  ALKPHOS 49  BILITOT 0.5  PROT 5.4*  ALBUMIN 3.2*   No results for input(s): LIPASE, AMYLASE in the last 168 hours. No results for input(s): AMMONIA in the last 168 hours. Coagulation Profile: No results for input(s): INR, PROTIME in the last 168 hours. Cardiac Enzymes: No results for input(s): CKTOTAL, CKMB, CKMBINDEX, TROPONINI in the last 168 hours. BNP (last 3 results) No results for input(s): PROBNP in the last 8760 hours. HbA1C: No results for input(s): HGBA1C in the last 72 hours. CBG: Recent Labs  Lab 12/20/18 0115 12/20/18 0425  GLUCAP 155* 152*   Lipid Profile: No results for input(s): CHOL, HDL, LDLCALC, TRIG, CHOLHDL, LDLDIRECT in the last 72 hours. Thyroid Function Tests: No results for input(s): TSH, T4TOTAL, FREET4, T3FREE, THYROIDAB in the last 72 hours. Anemia Panel: No results for input(s): VITAMINB12, FOLATE, FERRITIN, TIBC, IRON, RETICCTPCT in  the last 72 hours. Urine analysis:    Component Value Date/Time   COLORURINE YELLOW 05/13/2015 1549   APPEARANCEUR TURBID (A) 05/13/2015 1549   LABSPEC 1.010 05/13/2015 1549   PHURINE 5.5 05/13/2015 1549   GLUCOSEU NEGATIVE 05/13/2015 1549   HGBUR LARGE (A) 05/13/2015 1549   BILIRUBINUR NEGATIVE 05/13/2015 1549   KETONESUR NEGATIVE 05/13/2015 1549   PROTEINUR 30 (A) 05/13/2015 1549   UROBILINOGEN 1.0 05/13/2015 1549   NITRITE NEGATIVE 05/13/2015 1549   LEUKOCYTESUR SMALL (A) 05/13/2015 1549   Sepsis Labs: @LABRCNTIP (procalcitonin:4,lacticidven:4)  ) Recent Results (from the past 240 hour(s))  SARS Coronavirus 2     Status: Abnormal   Collection Time: 12/19/18  6:18 PM  Result Value Ref Range Status   SARS Coronavirus 2 DETECTED (A) NOT DETECTED Final    Comment: RESULT CALLED TO, READ BACK BY AND  VERIFIED WITH: Lendell Caprice RN 2008 12/19/18 A BROWNING (NOTE) SARS-CoV-2 target nucleic acids are DETECTED. The SARS-CoV-2 RNA is generally detectable in upper and lower respiratory specimens during the acute phase of infection. Positive results are indicative of active infection with SARS-CoV-2. Clinical  correlation with patient history and other diagnostic information is necessary to determine patient infection status. Positive results do  not rule out bacterial infection or co-infection with other viruses. The expected result is Not Detected. Fact Sheet for Patients: http://www.biofiredefense.com/wp-content/uploads/2020/03/BIOFIRE-COVID -19-patients.pdf Fact Sheet for Healthcare Providers: http://www.biofiredefense.com/wp-content/uploads/2020/03/BIOFIRE-COVID -19-hcp.pdf This test is not yet approved or cleared by the [place] and  has been authorized for detection and/or diagnosis of SARS-CoV-2 by FDA under an Emergency  Use Authorization (EUA).  This EUA will remain in effect (meaning this test can be used) for the duration of  the COVID-19 declaration under Section 564(b)(1) of the Act, 21 U.S.C. section [phone] 3(b)(1), unless the authorization is terminated or revoked sooner. Performed at [facility], [place] [address], [place], [place] [address]       Studies: US Renal  Result Date: 12/19/2018 CLINICAL DATA:  Acute renal insufficiency. EXAM: RENAL / URINARY TRACT ULTRASOUND COMPLETE COMPARISON:  CT scan Nov 08, 2017 FINDINGS: Right Kidney: Renal measurements: 10.1 x 4.8 by 5.8 cm = volume: 148 mL. Moderate hydronephrosis worsened since previous imaging. Left Kidney: Renal measurements: 13 x 6.3 x 6.7 cm = volume: 286 mL. Moderate hydronephrosis, worsened since previous imaging. Bladder: Appears normal for degree of bladder distention. IMPRESSION: Bilateral moderate hydronephrosis is worsened compared to previous studies. The bilateral hydronephrosis is otherwise  age indeterminate. Ongoing obstruction not excluded on this study. Recommend clinical correlation. Electronically Signed   By: Dorise Bullion III M.D   On: 12/19/2018 18:01    Scheduled Meds: . Chlorhexidine Gluconate Cloth  6 each Topical Q0600  . docusate sodium  100 mg Oral QHS  . escitalopram  5 mg Oral Daily  . gabapentin  100 mg Oral TID  . heparin  5,000 Units Subcutaneous Q8H  . hydrocerin   Topical BID  . hydrocortisone   Rectal TID  . insulin aspart  0-20 Units Subcutaneous TID WC  . insulin aspart  0-5 Units Subcutaneous QHS  . insulin glargine  5 Units Subcutaneous QHS  . latanoprost  1 drop Both Eyes QHS  . loratadine  10 mg Oral Daily  . Melatonin  6 mg Oral QHS  . multivitamin with minerals  1 tablet Oral QPM  . potassium chloride  40 mEq Oral Once  . vitamin B-12  1,000 mcg Oral Daily    Continuous Infusions: .  sodium chloride 75 mL/hr at 12/20/18 0951     LOS: 1 day     Kayleen Memos, MD [facility] Pager [phone]  If 7PM-7AM, please contact night-coverage [email or web address] Password [facility] 12/20/2018, 9:54 AM

## 2018-12-20 NOTE — Consult Note (Signed)
[place] Nurse wound consult note Patient receiving care in [facility] 2M16. Due to the nature of this patient's positive COVID-19 with isolation and in keeping with efforts to prevent the spread of infection and to conserve personal protective equipment, a physical exam was not personally performed. A chart review of other providers notes and the patient's lab work as well as review of other pertinent studies was performed.   Reason for Consult: BLE unna boots Wound type: possibly venous stasis Measurement: to be provided by the bedside RN Wound bed:to be provided by the bedside RN Drainage (amount, consistency, odor) to be provided by the bedside RN Periwound: to be provided by the bedside RN Dressing procedure/placement/frequency: Orders entered include to remove the unna boots, primary RN to measure and record along with the characteristics of the wounds in the flowsheet section of the EMR. Apply Xeroform gauze to wounds, secure with kerlex. Notify the MD if wounds have necrosis, foul odor, or show signs of infection. Photos are not available in the EMR. Monitor the wound area(s) for worsening of condition such as: Signs/symptoms of infection,  Increase in size,  Development of or worsening of odor, Development of pain, or increased pain at the affected locations.  Notify the medical team if any of these develop.  Thank you for the consult. [place] nurse will not follow at this time.  Please re-consult the [place] team if needed.  Val Riles, RN, MSN, CWOCN, CNS-BC, pager [phone]

## 2018-12-20 NOTE — Consult Note (Signed)
Referring Provider: No ref. provider found Primary Care Physician:  Guadlupe Spanish, MD Primary Nephrologist:    Reason for Consultation: Acute kidney injury, maintenance of euvolemia, assessment and treatment of electrolyte abnormalities.  HPI: This is an 83 year old gentleman who presents the emergency room with acute kidney injury.  He had been discharged from the facility 11/26/2018 after a diagnosis of COVID-19 infection.  He has chronic renal insufficiency and prior to his hospitalization had a creatinine 1.3-2.0.  On Nov 15, 2018 his creatinine was 1.83.  He has a history of atrial fibrillation and is on anticoagulant therapy with DOA.  He has a history of atherosclerosis and a history of PCI to LAD 1997.  He has hypertension and diabetes mellitus type 2.  Home medications Norvasc 10 mg daily, Lipitor 20 mg daily Zyrtec 10 mg daily Trulicity 5.277 mcg weekly, Lexapro 5 mg daily gabapentin 100 mg 3 times daily metoprolol 25 mg daily multivitamins 1 daily, torsemide 40 mg daily Xarelto 50 mg daily.  Hospital medications Lexapro 5 mg daily, insulin sliding scale, insulin Lantus, heparin drip, vitamin B12 1000 mcg daily, Neurontin 100 mg 3 times daily  Urine output 1.2 L 12/20/2018   weight 141.2  Blood pressure 128/73 pulse 86 temperature 98.2 O2 sats 97% on 2 L nasal cannula.   Renal ultrasound 12/19/2018 showed bilateral moderate hydronephrosis. Chest x-ray resolution of infiltrates 12/20/2018  Sodium 140 potassium 3.1 chloride 96 CO2 29 BUN 89 creatinine 4.4 glucose 148 albumin 3.2 AST 28 ALT 29 WBC 4.0 hemoglobin 12.4 platelets 207.    Past Medical History:  Diagnosis Date  . Anteroseptal myocardial infarction ([place])   . Atrial fibrillation ([facility])    a. chronic, on Xarelto  . CHF (congestive heart failure) ([place])   . Chronic kidney disease   . COPD (chronic obstructive pulmonary disease) ([place])   . Coronary atherosclerosis of unspecified type of vessel, native or graft    a. s/p  prior PCI to LAD in 1997  . Diverticulosis   . Glaucoma   . Hypercholesteremia   . Hypertensive renal disease   . Hypokalemia   . Obesity hypoventilation syndrome ([place])   . Obesity, unspecified   . OSA (obstructive sleep apnea)   . Rhabdomyolysis   . Type II or unspecified type diabetes mellitus without mention of complication, not stated as uncontrolled   . Unspecified essential hypertension   . Vestibulopathy     Past Surgical History:  Procedure Laterality Date  . APPENDECTOMY  1985  . CARDIAC CATHETERIZATION    . CHOLECYSTECTOMY  06/2000  . CORONARY ANGIOPLASTY  08/14/1995   stent placement to LAD   . ENDOVENOUS ABLATION SAPHENOUS VEIN W/ LASER Left 02/21/2018   endovenous laser ablation L GSV by Ruta Hinds MD   . Foreman Right 1985  . KNEE ARTHROSCOPY Left 1991  . LUMBAR FUSION    . NOSE SURGERY  1970s   Per Dr. Terrence Dupont [facility] in pt chart    Prior to Admission medications   Medication Sig Start Date End Date Taking? Authorizing Provider  acetaminophen (TYLENOL) 325 MG tablet Take 650 mg by mouth every 4 (four) hours as needed for mild pain.   Yes [provider]  amLODipine (NORVASC) 10 MG tablet Take 10 mg by mouth daily.   Yes [provider]  atorvastatin (LIPITOR) 20 MG tablet Take 20 mg by mouth at bedtime.    Yes [provider]  cetirizine (ZYRTEC) 10 MG tablet Take 10 mg by  mouth at bedtime.   Yes [provider]  docusate sodium (COLACE) 100 MG capsule Take 100 mg by mouth at bedtime.   Yes [provider]  Dulaglutide (TRULICITY) 2.97 LG/9.2JJ SOPN Inject 0.5 mLs into the skin every Monday.   Yes [provider]  escitalopram (LEXAPRO) 5 MG tablet Take 5 mg by mouth daily.   Yes [provider]  gabapentin (NEURONTIN) 100 MG capsule Take 100 mg by mouth 3 (three) times daily.   Yes [provider]  latanoprost (XALATAN) 0.005 % ophthalmic solution Place 1 drop into both eyes  at bedtime. 01/03/17  Yes [provider]  Magnesium 400 MG TABS Take 400 mg by mouth daily.   Yes [provider]  Melatonin 5 MG TABS Take 5 mg by mouth at bedtime.   Yes [provider]  Menthol, Topical Analgesic, 4.5 % GEL Apply 1 application topically 2 (two) times daily as needed (pain). Apply to forehead and left arm   Yes [provider]  metoprolol succinate (TOPROL-XL) 25 MG 24 hr tablet Take 25 mg by mouth daily.   Yes [provider]  Multiple Vitamin (MULTIVITAMIN WITH MINERALS) TABS tablet Take 1 tablet by mouth every evening.   Yes [provider]  nitroGLYCERIN (NITROSTAT) 0.4 MG SL tablet Place 0.4 mg under the tongue every 5 (five) minutes as needed for chest pain.   Yes [provider]  phenylephrine-shark liver oil-mineral oil-petrolatum (PREPARATION H) 0.25-3-14-71.9 % rectal ointment Place 1 application rectally 3 (three) times daily.   Yes [provider]  Potassium Chloride ER 20 MEQ TBCR Take 20 mEq by mouth daily.   Yes [provider]  Skin Protectants, Misc. (EUCERIN) cream Apply 1 application topically 2 (two) times a day.   Yes [provider]  torsemide (DEMADEX) 20 MG tablet Take 2 tablets (40 mg total) by mouth daily. 12/04/18  Yes Charlynne Cousins, MD  vitamin B-12 (CYANOCOBALAMIN) 1000 MCG tablet Take 1,000 mcg by mouth daily.   Yes [provider]  XARELTO 15 MG TABS tablet Take 15 mg by mouth daily with supper.  06/17/14  Yes [provider]  metoprolol tartrate (LOPRESSOR) 25 MG tablet Take 1 tablet (25 mg total) by mouth 2 (two) times daily. Patient not taking: Reported on 12/19/2018 05/18/15   Charolette Forward, MD    Current Facility-Administered Medications  Medication Dose Route Frequency Provider Last Rate Last Dose  . 0.9 %  sodium chloride infusion   Intravenous Continuous Blanchie Dessert, MD 75 mL/hr at 12/20/18 0951    . acetaminophen (TYLENOL)  tablet 650 mg  650 mg Oral Q6H PRN Blanchie Dessert, MD       Or  . acetaminophen (TYLENOL) suppository 650 mg  650 mg Rectal Q6H PRN Blanchie Dessert, MD      . calcium carbonate (dosed in mg elemental calcium) suspension 500 mg of elemental calcium  500 mg of elemental calcium Oral Q6H PRN Blanchie Dessert, MD      . camphor-menthol (SARNA) lotion 1 application  1 application Topical H4R PRN Blanchie Dessert, MD       And  . hydrOXYzine (ATARAX/VISTARIL) tablet 25 mg  25 mg Oral Q8H PRN Blanchie Dessert, MD      . Chlorhexidine Gluconate Cloth 2 % PADS 6 each  6 each Topical Q0600 Kayleen Memos, DO   6 each at 12/20/18 0951  . docusate sodium (COLACE) capsule 100 mg  100 mg Oral QHS Swayze, Ava,  DO   100 mg at 12/19/18 2245  . docusate sodium (ENEMEEZ) enema 283 mg  1 enema Rectal PRN Blanchie Dessert, MD      . escitalopram (LEXAPRO) tablet 5 mg  5 mg Oral Daily Swayze, Ava, DO      . feeding supplement (NEPRO CARB STEADY) liquid 237 mL  237 mL Oral TID PRN Blanchie Dessert, MD      . gabapentin (NEURONTIN) capsule 100 mg  100 mg Oral TID Swayze, Ava, DO   100 mg at 12/20/18 0953  . heparin ADULT infusion 100 units/mL (25000 units/273mL sodium chloride 0.45%)  1,500 Units/hr Intravenous Continuous Ronna Polio, RPH 15 mL/hr at 12/20/18 1309 1,500 Units/hr at 12/20/18 1309  . hydrocerin (EUCERIN) cream   Topical BID Swayze, Ava, DO      . hydrocortisone (ANUSOL-HC) 2.5 % rectal cream   Rectal TID Swayze, Ava, DO      . insulin aspart (novoLOG) injection 0-20 Units  0-20 Units Subcutaneous TID WC Schorr, Rhetta Mura, NP   4 Units at 12/20/18 1312  . insulin aspart (novoLOG) injection 0-5 Units  0-5 Units Subcutaneous QHS Schorr, Rhetta Mura, NP      . insulin glargine (LANTUS) injection 5 Units  5 Units Subcutaneous QHS Schorr, Katherine P, NP      . latanoprost (XALATAN) 0.005 % ophthalmic solution 1 drop  1 drop Both Eyes QHS Swayze, Ava, DO   1 drop at 12/19/18 2247  . loratadine  (CLARITIN) tablet 10 mg  10 mg Oral Daily Swayze, Ava, DO   10 mg at 12/20/18 0953  . Melatonin TABS 6 mg  6 mg Oral QHS Swayze, Ava, DO   6 mg at 12/19/18 2246  . multivitamin with minerals tablet 1 tablet  1 tablet Oral QPM Swayze, Ava, DO   1 tablet at 12/19/18 2301  . nitroGLYCERIN (NITROSTAT) SL tablet 0.4 mg  0.4 mg Sublingual Q5 min PRN Swayze, Ava, DO      . ondansetron (ZOFRAN) tablet 4 mg  4 mg Oral Q6H PRN Blanchie Dessert, MD       Or  . ondansetron (ZOFRAN) injection 4 mg  4 mg Intravenous Q6H PRN Plunkett, Whitney, MD      . sorbitol 70 % solution 30 mL  30 mL Oral PRN Blanchie Dessert, MD      . vitamin B-12 (CYANOCOBALAMIN) tablet 1,000 mcg  1,000 mcg Oral Daily Swayze, Ava, DO   1,000 mcg at 12/20/18 0953  . zolpidem (AMBIEN) tablet 5 mg  5 mg Oral QHS PRN Blanchie Dessert, MD        Allergies as of 12/19/2018 - Review Complete 12/19/2018  Allergen Reaction Noted  . Adhesive [tape] Rash 08/11/2016    Family History  Problem Relation Age of Onset  . COPD Mother   . Heart disease Mother   . Diabetes Father   . Heart disease Father   . Diabetes Sister   . Heart disease Brother   . Heart disease Brother   . Breast cancer Maternal Aunt   . Diabetes Paternal Grandmother   . Colon cancer Maternal Aunt   . Ovarian cancer Daughter     Social History   Socioeconomic History  . Marital status: Single    Spouse name: Not on file  . Number of children: 5  . Years of education: Not on file  . Highest education level: Not on file  Occupational History  . Occupation: retired  Scientific laboratory technician  . Financial  resource strain: Not on file  . Food insecurity    Worry: Not on file    Inability: Not on file  . Transportation needs    Medical: Not on file    Non-medical: Not on file  Tobacco Use  . Smoking status: Never Smoker  . Smokeless tobacco: Never Used  Substance and Sexual Activity  . Alcohol use: No    Alcohol/week: 0.0 standard drinks  . Drug use: No  .  Sexual activity: Never  Lifestyle  . Physical activity    Days per week: Not on file    Minutes per session: Not on file  . Stress: Not on file  Relationships  . Social Herbalist on phone: Not on file    Gets together: Not on file    Attends religious service: Not on file    Active member of club or organization: Not on file    Attends meetings of clubs or organizations: Not on file    Relationship status: Not on file  . Intimate partner violence    Fear of current or ex partner: Not on file    Emotionally abused: Not on file    Physically abused: Not on file    Forced sexual activity: Not on file  Other Topics Concern  . Not on file  Social History Narrative  . Not on file    Review of Systems: Gen: Feels generally weak HEENT: No visual complaints, No history of Retinopathy. Normal external appearance No Epistaxis or Sore throat. No sinusitis.   CV: History of coronary artery disease but no chest pain angina palpitations or syncopal spells prior to admission Resp: Recent admission for COVID 19 pneumonia GI: Denies vomiting blood, jaundice, and fecal incontinence.   Denies dysphagia or odynophagia. GU : Denies urinary burning, blood in urine, urinary frequency, urinary hesitancy, nocturnal urination, and urinary incontinence.  No renal calculi. MS: Weakness without pain no use of nonsteroidal anti-inflammatory drugs Derm: Denies rash, itching, dry skin, hives, moles, warts, or unhealing ulcers.  Psych: Denies depression, anxiety, memory loss, suicidal ideation, hallucinations, paranoia, and confusion. Heme: Denies bruising, bleeding, and enlarged lymph nodes. Neuro: History of peripheral neuropathy seizures. No paresthesias.  No weakness. Endocrine history of type 2 diabetes  Physical Exam: Vital signs in last 24 hours: Temp:  [97.6 F (36.4 C)-98.3 F (36.8 C)] 98.2 F (36.8 C) (06/11 1259) Pulse Rate:  [37-188] 37 (06/11 1100) Resp:  [11-19] 18 (06/11  1200) BP: (98-139)/(48-122) 138/65 (06/11 1200) SpO2:  [95 %-100 %] 96 % (06/11 1200) Weight:  [141.2 kg] 141.2 kg (06/11 0446)   Physical examination limited secondary to COVID-19 epidemic.  Examination in conjunction with discussion with nurse.  Intake/Output from previous day: 06/10 0701 - 06/11 0700 In: 1490 [P.O.:240; I.V.:750; IV Piggyback:500] Out: 655 [Urine:655] Intake/Output this shift: Total I/O In: 360 [P.O.:360] Out: 575 [Urine:575]  Lab Results: Recent Labs    12/19/18 1221 12/20/18 0414  WBC 5.1 4.0  HGB 13.2 12.4*  HCT 42.0 38.9*  PLT 215 207   BMET Recent Labs    12/19/18 1221 12/20/18 0414  NA 141 140  K 2.8* 3.1*  CL 96* 96*  CO2 30 29  GLUCOSE 191* 148*  BUN 90* 89*  CREATININE 4.74* 4.40*  CALCIUM 10.4* 9.9   LFT Recent Labs    12/20/18 0414  PROT 5.4*  ALBUMIN 3.2*  AST 28  ALT 29  ALKPHOS 49  BILITOT 0.5   PT/INR No results  for input(s): LABPROT, INR in the last 72 hours. Hepatitis Panel No results for input(s): HEPBSAG, HCVAB, HEPAIGM, HEPBIGM in the last 72 hours.  Studies/Results: US Renal  Result Date: 12/19/2018 CLINICAL DATA:  Acute renal insufficiency. EXAM: RENAL / URINARY TRACT ULTRASOUND COMPLETE COMPARISON:  CT scan Nov 08, 2017 FINDINGS: Right Kidney: Renal measurements: 10.1 x 4.8 by 5.8 cm = volume: 148 mL. Moderate hydronephrosis worsened since previous imaging. Left Kidney: Renal measurements: 13 x 6.3 x 6.7 cm = volume: 286 mL. Moderate hydronephrosis, worsened since previous imaging. Bladder: Appears normal for degree of bladder distention. IMPRESSION: Bilateral moderate hydronephrosis is worsened compared to previous studies. The bilateral hydronephrosis is otherwise age indeterminate. Ongoing obstruction not excluded on this study. Recommend clinical correlation. Electronically Signed   By: Dorise Bullion III M.D   On: 12/19/2018 18:01   Dg Chest Port 1 View  Result Date: 12/20/2018 CLINICAL DATA:  COVID-19  positivity EXAM: PORTABLE CHEST 1 VIEW COMPARISON:  11/16/2018 FINDINGS: Cardiac shadow is within normal limits. Previously seen patchy infiltrates have resolved when compare with the prior exam. No other focal abnormality is noted. IMPRESSION: Resolution of previously seen infiltrates. Electronically Signed   By: Inez Catalina M.D.   On: 12/20/2018 11:06    Assessment/Plan:  Acute kidney injury.  There appears to be no  use of nephrotoxins.  Vital signs appear to be stable with no evidence of systemic hypotension.  Renal ultrasound consistent with bilateral hydronephrosis.  I believe this is the best explanation for his acute kidney injury.  He does not have a Foley catheter.  I would recommend placement of indwelling Foley catheter so that ins and outs can be closely monitored.  I would send the urine for urine electrolytes urinalysis and microscopy.  This does not appear to be acute glomerular injury or acute interstitial nephritis.  However we will proceed with urine indices and urinalysis.  Continue to avoid nephrotoxins ACE inhibitors and ARB's, NSAIDs and Cox 2 inhibitors, avoid IV contrast and intra-arterial procedures  Hypertension/volume.  Appears to be stable euvolemic.  Measure I's and O's with Foley catheter placement  Anemia stable no issue at this time  Hypokalemia being repleted will need to continue to follow I's and O's with close monitoring of potassium  Acid-base stable.  Diabetes mellitus as per primary team    LOS: [place] @TODAY @3 :31 PM

## 2018-12-21 LAB — RENAL FUNCTION PANEL
Albumin: 2.7 g/dL — ABNORMAL LOW (ref 3.5–5.0)
Anion gap: 11 (ref 5–15)
BUN: 81 mg/dL — ABNORMAL HIGH (ref 8–23)
CO2: 31 mmol/L (ref 22–32)
Calcium: 9.6 mg/dL (ref 8.9–10.3)
Chloride: 100 mmol/L (ref 98–111)
Creatinine, Ser: 3.67 mg/dL — ABNORMAL HIGH (ref 0.61–1.24)
GFR calc Af Amer: 17 mL/min — ABNORMAL LOW (ref >60)
GFR calc non Af Amer: 15 mL/min — ABNORMAL LOW (ref >60)
Glucose, Bld: 158 mg/dL — ABNORMAL HIGH (ref 70–99)
Phosphorus: 5.3 mg/dL — ABNORMAL HIGH (ref 2.5–4.6)
Potassium: 2.9 mmol/L — ABNORMAL LOW (ref 3.5–5.1)
Sodium: 142 mmol/L (ref 135–145)

## 2018-12-21 LAB — GLUCOSE, CAPILLARY
Glucose-Capillary: 141 mg/dL — ABNORMAL HIGH (ref 70–99)
Glucose-Capillary: 124 mg/dL — ABNORMAL HIGH (ref 70–99)
Glucose-Capillary: 151 mg/dL — ABNORMAL HIGH (ref 70–99)
Glucose-Capillary: 109 mg/dL — ABNORMAL HIGH (ref 70–99)

## 2018-12-21 LAB — MAGNESIUM: Magnesium: 2 mg/dL (ref 1.7–2.4)

## 2018-12-21 MED ORDER — TAMSULOSIN HCL 0.4 MG PO CAPS
0.4000 mg | ORAL_CAPSULE | Freq: Every day | ORAL | Status: DC
  Administered 2018-12-21 – 2018-12-24 (×4): 0.4 mg via ORAL
  Filled 2018-12-21 (×4): qty 1

## 2018-12-21 MED ORDER — POTASSIUM CHLORIDE CRYS ER 20 MEQ PO TBCR
40.0000 meq | EXTENDED_RELEASE_TABLET | Freq: Once | ORAL | Status: AC
  Administered 2018-12-21: 40 meq via ORAL
  Filled 2018-12-21: qty 2

## 2018-12-21 MED ORDER — POTASSIUM CHLORIDE CRYS ER 20 MEQ PO TBCR
30.0000 meq | EXTENDED_RELEASE_TABLET | Freq: Once | ORAL | Status: AC
  Administered 2018-12-21: 30 meq via ORAL
  Filled 2018-12-21: qty 1

## 2018-12-21 NOTE — Progress Notes (Signed)
[facility] Progress Note    Assessment/ Plan:   83 year old gentleman with recent COVID-49 d/c from the facility 11/26/2018 after a diagnosis of COVID-19 infection.  h/o CKD and prior to his hospitalization had a creatinine 1.3-2.0.  On Nov 15, 2018 his creatinine was 1.83.  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 Home medications Norvasc 10 mg daily, Lipitor 20 mg daily Zyrtec 10 mg daily Trulicity 7.253 mcg weekly, Lexapro 5 mg daily gabapentin 100 mg 3 times daily metoprolol 25 mg daily multivitamins 1 daily, torsemide 40 mg daily Xarelto 50 mg daily. Found by renal ultrasound 12/19/2018 to have  bilateral moderate hydronephrosis.    Acute kidney injury.  There appears to be no  use of nephrotoxins.  Vital signs appear to be stable with no evidence of systemic hypotension.  Renal ultrasound consistent with bilateral hydronephrosis; AKI likely secondary to obstruction. Foley is already in place with good UOP. - Urine microscopy does not suggest acute glomerular injury or AIN.   Continue to avoid nephrotoxins ACE inhibitors and ARB's, NSAIDs and Cox 2 inhibitors, avoid IV contrast and intra-arterial procedures - Will continue to follow and trend renal function. - Will watch for uptick in SNa as postobstructive diuresis is usually hypotonic. - No fluid restriction.  Hypertension/volume.  Appears to be stable euvolemic.  Anemia stable no issue at this time  Hypokalemia being repleted will need to continue to follow I's and O's with close monitoring of potassium  Acid-base stable.  Diabetes mellitus as per primary team  Subjective:   Feeling better w/ no c/o dyspnea.   Objective:   BP 123/61 (BP Location: Right Arm)   Pulse (!) 37   Temp 98.1 F (36.7 C) (Oral)   Resp 18   Ht 5\' 11"  (1.803 m)   Wt (!) 148.8 kg   SpO2 97%   BMI 45.75 kg/m   Intake/Output Summary (Last 24 hours) at 12/21/2018 6644 Last data filed at 12/21/2018 0500 Gross per 24 hour   Intake 1632.94 ml  Output 5525 ml  Net -3892.06 ml   Weight change: 8.185 kg  Physical Exam: I discussed findings with the hospitalist and reviews noted by providers in the chart to conserve PPE.  No lower extremity edema and pt with foley that is draining well.    Imaging: US Renal  Result Date: 12/19/2018 CLINICAL DATA:  Acute renal insufficiency. EXAM: RENAL / URINARY TRACT ULTRASOUND COMPLETE COMPARISON:  CT scan Nov 08, 2017 FINDINGS: Right Kidney: Renal measurements: 10.1 x 4.8 by 5.8 cm = volume: 148 mL. Moderate hydronephrosis worsened since previous imaging. Left Kidney: Renal measurements: 13 x 6.3 x 6.7 cm = volume: 286 mL. Moderate hydronephrosis, worsened since previous imaging. Bladder: Appears normal for degree of bladder distention. IMPRESSION: Bilateral moderate hydronephrosis is worsened compared to previous studies. The bilateral hydronephrosis is otherwise age indeterminate. Ongoing obstruction not excluded on this study. Recommend clinical correlation. Electronically Signed   By: Dorise Bullion III M.D   On: 12/19/2018 18:01   Dg Chest Port 1 View  Result Date: 12/20/2018 CLINICAL DATA:  COVID-19 positivity EXAM: PORTABLE CHEST 1 VIEW COMPARISON:  11/16/2018 FINDINGS: Cardiac shadow is within normal limits. Previously seen patchy infiltrates have resolved when compare with the prior exam. No other focal abnormality is noted. IMPRESSION: Resolution of previously seen infiltrates. Electronically Signed   By: Inez Catalina M.D.   On: 12/20/2018 11:06    Labs: BMET Recent Labs  Lab 12/19/18 1221  12/20/18 0414  NA 141 140  K 2.8* 3.1*  CL 96* 96*  CO2 30 29  GLUCOSE 191* 148*  BUN 90* 89*  CREATININE 4.74* 4.40*  CALCIUM 10.4* 9.9   CBC Recent Labs  Lab 12/19/18 1221 12/20/18 0414  WBC 5.1 4.0  HGB 13.2 12.4*  HCT 42.0 38.9*  MCV 89.2 88.0  PLT 215 207    Medications:    . apixaban  2.5 mg Oral BID  . Chlorhexidine Gluconate Cloth  6 each Topical  Q0600  . docusate sodium  100 mg Oral QHS  . escitalopram  5 mg Oral Daily  . gabapentin  100 mg Oral TID  . hydrocerin   Topical BID  . hydrocortisone   Rectal TID  . insulin aspart  0-20 Units Subcutaneous TID WC  . insulin aspart  0-5 Units Subcutaneous QHS  . insulin glargine  5 Units Subcutaneous QHS  . latanoprost  1 drop Both Eyes QHS  . loratadine  10 mg Oral Daily  . Melatonin  6 mg Oral QHS  . multivitamin with minerals  1 tablet Oral QPM  . polyethylene glycol  17 g Oral Daily  . vitamin B-12  1,000 mcg Oral Daily      Otelia Santee, MD 12/21/2018, 9:29 AM

## 2018-12-21 NOTE — Progress Notes (Signed)
PROGRESS NOTE  Reginald [name] GUY:[id number] DOB: 1935/08/07 DOA: 12/19/2018 PCP: Guadlupe Spanish, MD  HPI/Recap of past 24 hours: The patient is a 83 yr old man who presents to the ED after he was found to have a creatinine of 4.39. He was discharged from this facility on 11/26/2018 after a stay for COVID-19. At discharge his creatinine was 2.21. Prior to that illness it appears that his creatinine was less than 2.0. During his stay he had a modest increase in creatinine due to diuresis. At discharge it was recommended that his torsemide be held for 1 week, and that he have a recheck of his chemistry before restarting. The patient denies any difficulty eating or drinking. He denies difficulty starting a stream of urine. He states that he feels well. No new problems.   He carries a past medical history significant for  Atrial fibrillation, CHF (EF 10/2016 was 30 - 60%), CKD, COPD, CAD, Hyperlipidemia, OHS, OSA, DM II, and hypertension.  The hospitalist service was consulted to admit the patient for further evaluation and treatment.  ED Course: In the ED the patient's temp was 98.3, HR was 72, Blood pressure was 140/83. He was saturating 98% on 2 liters. He was given a 500 cc bolus.   12/21/18: Seen and examined at bedside.  On nasal cannula.  5.5 L urine output recorded in the last 24 hours.  Very hard of hearing.  Has no new concerns.   Assessment/Plan: Principal Problem:   Acute renal failure superimposed on stage 4 chronic kidney disease (HCC) Active Problems:   Type 2 diabetes mellitus (HCC)   OSA (obstructive sleep apnea)   Atrial fibrillation (HCC)   CAD (coronary artery disease), native coronary artery   Hyperlipidemia   Hypokalemia   HTN (hypertension)   Acute on chronic renal failure (HCC)  AKI on CKD 3 likely post renal from BPH Baseline creatinine appears to be 1.8 with GFR 33 Presented with creatinine of 4.74 and GFR of 11 Creatinine improving to 3.67 from 4.40  yesterday Renal ultrasound showed bilateral hydronephrosis + Urinary retention Continue to avoid nephrotoxins Continue to monitor urine output 5.5 L of urine output recorded in the last 24 hours  Bilateral hydronephrosis likely related to urinary retention Keep Foley catheter in place Start Tamsulosin Will need to follow-up with urology  Recent COVID-19 infection Recently admitted and discharged 11/26/2018 with positive COVID-19 test Independently reviewed chest x-ray from last admission which showed bilateral infiltrates suggestive of COVID-19 infection Repeat COVID-19 test done on 12/19/2018+ Transferred to 2 W. pulmonary COVID-19 floor. Maintain O2 saturation greater than 92% Continue albuterol inhaler 3 times daily Independently reviewed chest x-ray done on 12/20/2018 which showed improving aeration. Patient denies dyspnea. Continue to monitor  Intermittent bradycardia in the setting of A. Fib Intermittent slow ventricular response Continue to monitor on telemetry 2D echo completed.  LVEF 60 to 65%, mitral valve is degenerative in appearance.  Permanent A. Fib Holding beta-blocker due to intermittent bradycardia Started on Eliquis, continue  Chronic diastolic CHF Last 2D echo done in 2018 showed LVEF 55 to 60% Continue to hold torsemide due to AKI Currently on IV fluids 75 cc/h normal saline Monitor fluid status Obtain daily weights and I's and O's  Hypokalemia Presented with potassium of 2.8 Replete cautiously in the setting of AKI Repeat BMP in the morning  Type 2 diabetes with complication CKD Continue insulin sliding scale Last hemoglobin A1c was 8.3 on 11/16/18  COPD: Appears stable.  Inhalers as  needed.  CAD: Denies chest pain.  Continue home medications.  Hypertension: Home metoprolol and Norvasc due to intermittent bradycardia     Code Status: Full code  Family Communication: We will call family if is okay with the patient  Disposition Plan: Home  possibly in 1-2 days when hemodynamically stable.   Consultants:  Nephrology, Dr. Augustin Coupe.  Procedures:  None  Antimicrobials:  None  DVT prophylaxis: Subcu heparin 3 times daily   Objective: Vitals:   12/20/18 1259 12/20/18 2354 12/21/18 0500 12/21/18 0700  BP:  (!) 125/45  123/61  Pulse:      Resp:  18    Temp: 98.2 F (36.8 C) 98 F (36.7 C)  98.1 F (36.7 C)  TempSrc: Oral Oral  Oral  SpO2:    97%  Weight:   (!) 148.8 kg   Height:        Intake/Output Summary (Last 24 hours) at 12/21/2018 1430 Last data filed at 12/21/2018 0900 Gross per 24 hour  Intake 1632.94 ml  Output 4950 ml  Net -3317.06 ml   Filed Weights   12/19/18 1215 12/20/18 0446 12/21/18 0500  Weight: (!) 140.6 kg (!) 141.2 kg (!) 148.8 kg    Exam:  . General: 83 y.o. year-old male well-developed well-nourished in no acute distress.  Alert oriented x3.   . Cardiovascular: Irregular rate and rhythm with no rubs or gallops, JVD or thyromegaly noted. Marland Kitchen Respiratory: Mild rales diffusely with no wheezes noted.  Poor respiratory effort. . Abdomen: Morbidly obese with no tenderness on palpation.  Bowel sounds x4.   . Musculoskeletal: Lymphedema noted in lower extremities bilaterally.  Stockings in place. Marland Kitchen Psychiatry: Mood is appropriate for condition and setting.   Data Reviewed: CBC: Recent Labs  Lab 12/19/18 1221 12/20/18 0414  WBC 5.1 4.0  HGB 13.2 12.4*  HCT 42.0 38.9*  MCV 89.2 88.0  PLT 215 254   Basic Metabolic Panel: Recent Labs  Lab 12/19/18 1221 12/20/18 0414 12/21/18 0847  NA 141 140 142  K 2.8* 3.1* 2.9*  CL 96* 96* 100  CO2 30 29 31   GLUCOSE 191* 148* 158*  BUN 90* 89* 81*  CREATININE 4.74* 4.40* 3.67*  CALCIUM 10.4* 9.9 9.6  PHOS  --   --  5.3*   GFR: Estimated Creatinine Clearance: 23.8 mL/min (A) (by C-G formula based on SCr of 3.67 mg/dL (H)). Liver Function Tests: Recent Labs  Lab 12/20/18 0414 12/21/18 0847  AST 28  --   ALT 29  --   ALKPHOS 49  --    BILITOT 0.5  --   PROT 5.4*  --   ALBUMIN 3.2* 2.7*   No results for input(s): LIPASE, AMYLASE in the last 168 hours. No results for input(s): AMMONIA in the last 168 hours. Coagulation Profile: No results for input(s): INR, PROTIME in the last 168 hours. Cardiac Enzymes: No results for input(s): CKTOTAL, CKMB, CKMBINDEX, TROPONINI in the last 168 hours. BNP (last 3 results) No results for input(s): PROBNP in the last 8760 hours. HbA1C: No results for input(s): HGBA1C in the last 72 hours. CBG: Recent Labs  Lab 12/20/18 1256 12/20/18 1851 12/20/18 2348 12/21/18 0751 12/21/18 1307  GLUCAP 176* 163* 142* 141* 124*   Lipid Profile: No results for input(s): CHOL, HDL, LDLCALC, TRIG, CHOLHDL, LDLDIRECT in the last 72 hours. Thyroid Function Tests: No results for input(s): TSH, T4TOTAL, FREET4, T3FREE, THYROIDAB in the last 72 hours. Anemia Panel: No results for input(s): VITAMINB12, FOLATE, FERRITIN, TIBC, IRON,  RETICCTPCT in the last 72 hours. Urine analysis:    Component Value Date/Time   COLORURINE YELLOW 12/20/2018 1754   APPEARANCEUR CLEAR 12/20/2018 1754   LABSPEC 1.008 12/20/2018 1754   PHURINE 5.0 12/20/2018 1754   GLUCOSEU NEGATIVE 12/20/2018 1754   HGBUR NEGATIVE 12/20/2018 1754   BILIRUBINUR NEGATIVE 12/20/2018 1754   KETONESUR NEGATIVE 12/20/2018 1754   PROTEINUR NEGATIVE 12/20/2018 1754   UROBILINOGEN 1.0 05/13/2015 1549   NITRITE NEGATIVE 12/20/2018 1754   LEUKOCYTESUR NEGATIVE 12/20/2018 1754   Sepsis Labs: @LABRCNTIP (procalcitonin:4,lacticidven:4)  ) Recent Results (from the past 240 hour(s))  SARS Coronavirus 2     Status: Abnormal   Collection Time: 12/19/18  6:18 PM  Result Value Ref Range Status   SARS Coronavirus 2 DETECTED (A) NOT DETECTED Final    Comment: RESULT CALLED TO, READ BACK BY AND VERIFIED WITH: Lendell Caprice RN 2008 12/19/18 A BROWNING (NOTE) SARS-CoV-2 target nucleic acids are DETECTED. The SARS-CoV-2 RNA is generally detectable  in upper and lower respiratory specimens during the acute phase of infection. Positive results are indicative of active infection with SARS-CoV-2. Clinical  correlation with patient history and other diagnostic information is necessary to determine patient infection status. Positive results do  not rule out bacterial infection or co-infection with other viruses. The expected result is Not Detected. Fact Sheet for Patients: http://www.biofiredefense.com/wp-content/uploads/2020/03/BIOFIRE-COVID -19-patients.pdf Fact Sheet for Healthcare Providers: http://www.biofiredefense.com/wp-content/uploads/2020/03/BIOFIRE-COVID -19-hcp.pdf This test is not yet approved or cleared by the [place] and  has been authorized for detection and/or diagnosis of SARS-CoV-2 by FDA under an Emergency  Use Authorization (EUA).  This EUA will remain in effect (meaning this test can be used) for the duration of  the COVID-19 declaration under Section 564(b)(1) of the Act, 21 U.S.C. section 2160713582 3(b)(1), unless the authorization is terminated or revoked sooner. Performed at [facility], [place] [address], [place], [place] [address]       Studies: No results found.  Scheduled Meds: . apixaban  2.5 mg Oral BID  . Chlorhexidine Gluconate Cloth  6 each Topical Q0600  . docusate sodium  100 mg Oral QHS  . escitalopram  5 mg Oral Daily  . gabapentin  100 mg Oral TID  . hydrocerin   Topical BID  . hydrocortisone   Rectal TID  . insulin aspart  0-20 Units Subcutaneous TID WC  . insulin aspart  0-5 Units Subcutaneous QHS  . insulin glargine  5 Units Subcutaneous QHS  . latanoprost  1 drop Both Eyes QHS  . loratadine  10 mg Oral Daily  . Melatonin  6 mg Oral QHS  . multivitamin with minerals  1 tablet Oral QPM  . polyethylene glycol  17 g Oral Daily  . potassium chloride  30 mEq Oral Once  . vitamin B-12  1,000 mcg Oral Daily    Continuous Infusions: . sodium chloride 75 mL/hr at 12/21/18  0900     LOS: 2 days     Reginald Memos, MD [facility] Pager [phone]  If 7PM-7AM, please contact night-coverage [email or web address] Password [facility] 12/21/2018, 2:30 PM

## 2018-12-21 NOTE — TOC Benefit Eligibility Note (Signed)
Transition of Care (TOC) Benefit Eligibility Note    Patient Details  Name: Reginald [name] MRN: [id number] Date of Birth: 04/15/1936   Medication/Dose: ELIQUIS   2.5 MG BID   AND  ELIQUIS  5 MG BID  CO-PAY- $32.00  Covered?: Yes  Tier: 3 Drug  Prescription Coverage Preferred Pharmacy: [facility]  Spoke with Person/Company/Phone Number:: CEL  @ [employer] # [phone]  Co-Pay: $ 32.00  Prior Approval: No  Deductible: Met  Additional Notes: [place]  EFF-DATE: 07/11/2018, CO-PAY- $ 3.90 FOR EACH PRESCRIPTION    ,  Phone Number: 12/21/2018, 9:10 AM     

## 2018-12-21 NOTE — Progress Notes (Signed)
Benefit check complete,he has 30 day free eliquis coupon.

## 2018-12-22 LAB — GLUCOSE, CAPILLARY
Glucose-Capillary: 122 mg/dL — ABNORMAL HIGH (ref 70–99)
Glucose-Capillary: 170 mg/dL — ABNORMAL HIGH (ref 70–99)
Glucose-Capillary: 174 mg/dL — ABNORMAL HIGH (ref 70–99)
Glucose-Capillary: 164 mg/dL — ABNORMAL HIGH (ref 70–99)

## 2018-12-22 LAB — BASIC METABOLIC PANEL
Anion gap: 12 (ref 5–15)
BUN: 64 mg/dL — ABNORMAL HIGH (ref 8–23)
CO2: 27 mmol/L (ref 22–32)
Calcium: 9.8 mg/dL (ref 8.9–10.3)
Chloride: 102 mmol/L (ref 98–111)
Creatinine, Ser: 2.88 mg/dL — ABNORMAL HIGH (ref 0.61–1.24)
GFR calc Af Amer: 23 mL/min — ABNORMAL LOW (ref >60)
GFR calc non Af Amer: 20 mL/min — ABNORMAL LOW (ref >60)
Glucose, Bld: 148 mg/dL — ABNORMAL HIGH (ref 70–99)
Potassium: 2.9 mmol/L — ABNORMAL LOW (ref 3.5–5.1)
Sodium: 141 mmol/L (ref 135–145)

## 2018-12-22 LAB — MAGNESIUM: Magnesium: 1.9 mg/dL (ref 1.7–2.4)

## 2018-12-22 MED ORDER — POTASSIUM CHLORIDE CRYS ER 20 MEQ PO TBCR
40.0000 meq | EXTENDED_RELEASE_TABLET | Freq: Once | ORAL | Status: AC
  Administered 2018-12-22: 40 meq via ORAL
  Filled 2018-12-22: qty 2

## 2018-12-22 NOTE — Progress Notes (Addendum)
PROGRESS NOTE  Reginald [name] JQZ:[id number] DOB: 1936-02-08 DOA: 12/19/2018 PCP: Guadlupe Spanish, MD  HPI/Recap of past 24 hours: The patient is a 83 yr old man who presents to the ED after he was found to have a creatinine of 4.39. He was discharged from this facility on 11/26/2018 after a stay for COVID-19. At discharge his creatinine was 2.21. Prior to that illness it appears that his creatinine was less than 2.0. During his stay he had a modest increase in creatinine due to diuresis. At discharge it was recommended that his torsemide be held for 1 week, and that he have a recheck of his chemistry before restarting. The patient denies any difficulty eating or drinking. He denies difficulty starting a stream of urine. He states that he feels well. No new problems.   He carries a past medical history significant for  Atrial fibrillation, CHF (EF 10/2016 was 68 - 60%), CKD, COPD, CAD, Hyperlipidemia, OHS, OSA, DM II, and hypertension.  The hospitalist service was consulted to admit the patient for further evaluation and treatment.  ED Course: In the ED the patient's temp was 98.3, HR was 72, Blood pressure was 140/83. He was saturating 98% on 2 liters. He was given a 500 cc bolus.   12/22/18: Seen and examined at his bedside.  Still having intermittent A. fib with slow ventricular response despite holding off beta-blocker and calcium channel blockers.  Asymptomatic.  He denies any chest pain or dyspnea at rest.  On 2 L [place].  Cardiology has been consulted for management of this cardiac medications in the setting of A. fib with slow ventricular response.    Assessment/Plan: Principal Problem:   Acute renal failure superimposed on stage 4 chronic kidney disease (HCC) Active Problems:   Type 2 diabetes mellitus (HCC)   OSA (obstructive sleep apnea)   Atrial fibrillation (HCC)   CAD (coronary artery disease), native coronary artery   Hyperlipidemia   Hypokalemia   HTN (hypertension)  Acute on chronic renal failure (HCC)  AKI on CKD 3 likely post renal from BPH Baseline creatinine appears to be 1.8 with GFR 33 Presented with creatinine of 4.74 and GFR of 11 Creatinine continues to improve to 2.88 today from 3.67 from 4.40  Renal ultrasound showed bilateral hydronephrosis + Urinary retention, Foley catheter placed on 12/20/2018. Continue to avoid nephrotoxins Continue to monitor urine output Last urine output reported as 3950 cc in the last 24 hours Receiving gentle IV fluid hydration normal saline at 75 cc/h  Bilateral hydronephrosis likely related to urinary retention Keep Foley catheter in place Continue tamsulosin Will need to follow-up with urology  Permanent A. fib with slow ventricular response Consulted cardiology to further assess on 12/22/2018. Continue to hold off beta-blocker and calcium channel blocker until seen by cardiology. Started on Eliquis on 12/21/2018, continue Was previously on Xarelto which was switched to Eliquis due to renal insufficiency  Hypokalemia, persistent despite repletion Presented with potassium of 2.8 Replete cautiously in the setting of AKI Continue to replete until normalized Magnesium 1.9 on 12/22/2018 Daily BMPs  Recent COVID-19 infection Recently admitted and discharged 11/26/2018 with positive COVID-19 test Independently reviewed chest x-ray from last admission which showed bilateral infiltrates suggestive of COVID-19 infection Repeat COVID-19 test done on 12/19/2018+ Transferred to 2 W. pulmonary COVID-19 floor. Maintain O2 saturation greater than 92% Continue albuterol inhaler 3 times daily Independently reviewed chest x-ray done on 12/20/2018 which showed improving aeration. Patient denies dyspnea. Continue to monitor  Acute hypoxic  respiratory failure likely secondary to COPD versus COVID-19 infection On 2 L oxygen saturating 97% Obtain home O2 evaluation  Maintain O2 saturation greater than 92%  Chronic  diastolic CHF Last 2D echo done in 2018 showed LVEF 55 to 60% Continue to hold torsemide due to AKI Currently on IV fluids 75 cc/h normal saline Monitor fluid status Continue daily weights and strict I's and O's  2D echo done on 12/20/2018 showed normal LVEF 6065% with grade 2 diastolic dysfunction  Type 2 diabetes with complication CKD Continue insulin sliding scale Last hemoglobin A1c was 8.3 on 11/16/18  COPD: Appears stable.  Inhalers as needed.  CAD: Denies chest pain.  Continue home medications.  Hypertension: Home metoprolol and Norvasc due to intermittent bradycardia  Physical debility PT to assess Fall precautions  Morbid obesity BMI 45 Recommend weight loss outpatient with regular physical activity and healthy dieting     Code Status: Full code  Family Communication: We will call family if is okay with the patient  Disposition Plan: Home possibly in 1 to 2 days when cardiology signs off..   Consultants:  Nephrology, Dr. Augustin Coupe.  Signed off on 12/22/2018.  Cardiology consulted on 12/22/2018.  Procedures:  None  Antimicrobials:  None  DVT prophylaxis: Subcu heparin 3 times daily   Objective: Vitals:   12/22/18 0500 12/22/18 0600 12/22/18 0900 12/22/18 1000  BP:   (!) 110/58   Pulse:      Resp: 13 20 20    Temp:   (!) 97.5 F (36.4 C)   TempSrc:   Oral   SpO2: 97% 98% 95% 98%  Weight:      Height:        Intake/Output Summary (Last 24 hours) at 12/22/2018 1050 Last data filed at 12/22/2018 1000 Gross per 24 hour  Intake 2024.95 ml  Output 3950 ml  Net -1925.05 ml   Filed Weights   12/19/18 1215 12/20/18 0446 12/21/18 0500  Weight: (!) 140.6 kg (!) 141.2 kg (!) 148.8 kg    Exam:  . General: 83 y.o. year-old male obese in no acute distress.  Alert and oriented x3.   . Cardiovascular: Irregular rate and rhythm with no rubs or gallops.  No JVD or thyromegaly noted.   Marland Kitchen Respiratory: Clear to auscultation with no wheezes or rales.  Poor  inspiratory effort.   . Abdomen: Morbid obesity with no tenderness on palpation.  Positive bowel sounds.   . Musculoskeletal: Lymphedema noted in lower extremities bilaterally.  Compression stockings in place. Marland Kitchen Psychiatry: Mood is appropriate for condition and setting.   Data Reviewed: CBC: Recent Labs  Lab 12/19/18 1221 12/20/18 0414  WBC 5.1 4.0  HGB 13.2 12.4*  HCT 42.0 38.9*  MCV 89.2 88.0  PLT 215 284   Basic Metabolic Panel: Recent Labs  Lab 12/19/18 1221 12/20/18 0414 12/21/18 0847 12/22/18 0757  NA 141 140 142 141  K 2.8* 3.1* 2.9* 2.9*  CL 96* 96* 100 102  CO2 30 29 31 27   GLUCOSE 191* 148* 158* 148*  BUN 90* 89* 81* 64*  CREATININE 4.74* 4.40* 3.67* 2.88*  CALCIUM 10.4* 9.9 9.6 9.8  MG  --   --  2.0 1.9  PHOS  --   --  5.3*  --    GFR: Estimated Creatinine Clearance: 30.3 mL/min (A) (by C-G formula based on SCr of 2.88 mg/dL (H)). Liver Function Tests: Recent Labs  Lab 12/20/18 0414 12/21/18 0847  AST 28  --   ALT 29  --  ALKPHOS 49  --   BILITOT 0.5  --   PROT 5.4*  --   ALBUMIN 3.2* 2.7*   No results for input(s): LIPASE, AMYLASE in the last 168 hours. No results for input(s): AMMONIA in the last 168 hours. Coagulation Profile: No results for input(s): INR, PROTIME in the last 168 hours. Cardiac Enzymes: No results for input(s): CKTOTAL, CKMB, CKMBINDEX, TROPONINI in the last 168 hours. BNP (last 3 results) No results for input(s): PROBNP in the last 8760 hours. HbA1C: No results for input(s): HGBA1C in the last 72 hours. CBG: Recent Labs  Lab 12/21/18 0751 12/21/18 1307 12/21/18 1740 12/21/18 2252 12/22/18 0913  GLUCAP 141* 124* 151* 109* 122*   Lipid Profile: No results for input(s): CHOL, HDL, LDLCALC, TRIG, CHOLHDL, LDLDIRECT in the last 72 hours. Thyroid Function Tests: No results for input(s): TSH, T4TOTAL, FREET4, T3FREE, THYROIDAB in the last 72 hours. Anemia Panel: No results for input(s): VITAMINB12, FOLATE, FERRITIN,  TIBC, IRON, RETICCTPCT in the last 72 hours. Urine analysis:    Component Value Date/Time   COLORURINE YELLOW 12/20/2018 1754   APPEARANCEUR CLEAR 12/20/2018 1754   LABSPEC 1.008 12/20/2018 1754   PHURINE 5.0 12/20/2018 1754   GLUCOSEU NEGATIVE 12/20/2018 1754   HGBUR NEGATIVE 12/20/2018 1754   BILIRUBINUR NEGATIVE 12/20/2018 1754   KETONESUR NEGATIVE 12/20/2018 1754   PROTEINUR NEGATIVE 12/20/2018 1754   UROBILINOGEN 1.0 05/13/2015 1549   NITRITE NEGATIVE 12/20/2018 1754   LEUKOCYTESUR NEGATIVE 12/20/2018 1754   Sepsis Labs: @LABRCNTIP (procalcitonin:4,lacticidven:4)  ) Recent Results (from the past 240 hour(s))  SARS Coronavirus 2     Status: Abnormal   Collection Time: 12/19/18  6:18 PM  Result Value Ref Range Status   SARS Coronavirus 2 DETECTED (A) NOT DETECTED Final    Comment: RESULT CALLED TO, READ BACK BY AND VERIFIED WITH: Lendell Caprice RN 2008 12/19/18 A BROWNING (NOTE) SARS-CoV-2 target nucleic acids are DETECTED. The SARS-CoV-2 RNA is generally detectable in upper and lower respiratory specimens during the acute phase of infection. Positive results are indicative of active infection with SARS-CoV-2. Clinical  correlation with patient history and other diagnostic information is necessary to determine patient infection status. Positive results do  not rule out bacterial infection or co-infection with other viruses. The expected result is Not Detected. Fact Sheet for Patients: http://www.biofiredefense.com/wp-content/uploads/2020/03/BIOFIRE-COVID -19-patients.pdf Fact Sheet for Healthcare Providers: http://www.biofiredefense.com/wp-content/uploads/2020/03/BIOFIRE-COVID -19-hcp.pdf This test is not yet approved or cleared by the [place] and  has been authorized for detection and/or diagnosis of SARS-CoV-2 by FDA under an Emergency  Use Authorization (EUA).  This EUA will remain in effect (meaning this test can be used) for the duration of  the COVID-19  declaration under Section 564(b)(1) of the Act, 21 U.S.C. section [phone] 3(b)(1), unless the authorization is terminated or revoked sooner. Performed at [facility], [place] [address], [place], [place] [address]       Studies: No results found.  Scheduled Meds: . apixaban  2.5 mg Oral BID  . Chlorhexidine Gluconate Cloth  6 each Topical Q0600  . docusate sodium  100 mg Oral QHS  . escitalopram  5 mg Oral Daily  . gabapentin  100 mg Oral TID  . hydrocerin   Topical BID  . hydrocortisone   Rectal TID  . insulin aspart  0-20 Units Subcutaneous TID WC  . insulin aspart  0-5 Units Subcutaneous QHS  . insulin glargine  5 Units Subcutaneous QHS  . latanoprost  1 drop Both Eyes QHS  .  loratadine  10 mg Oral Daily  . Melatonin  6 mg Oral QHS  . multivitamin with minerals  1 tablet Oral QPM  . polyethylene glycol  17 g Oral Daily  . potassium chloride  40 mEq Oral Once  . tamsulosin  0.4 mg Oral QPC supper  . vitamin B-12  1,000 mcg Oral Daily    Continuous Infusions: . sodium chloride 75 mL/hr at 12/22/18 1000     LOS: 3 days     Kayleen Memos, MD [facility] Pager [phone]  If 7PM-7AM, please contact night-coverage [email or web address] Password [email or web address] 12/22/2018, 10:50 AM

## 2018-12-22 NOTE — Evaluation (Signed)
Physical Therapy Evaluation Patient Details Name: Reginald [name] MRN: [id number] DOB: [birth year] Today's Date: 12/22/2018   History of Present Illness  The patient is a 83 yr old man who presents to the ED after he was found to have a creatinine of 4.39. He was discharged from this facility on 11/26/2018 after a stay for COVID-19. He carries a past medical history significant for  Atrial fibrillation, CHF (EF 10/2016 was 40 - 60%), CKD, COPD, CAD, Hyperlipidemia, OHS, OSA, DM II, and hypertension.  Clinical Impression  Pt admitted with above diagnosis. Pt currently with functional limitations due to the deficits listed below (see PT Problem List). Pt was able to scoot pivot into chair with mod to min assist of 1 person.  Could not stand pivot as he had a heavy posterior lean.  Pt wants to return to [facility] when ready for d/c.  Pt will benefit from skilled PT to increase their independence and safety with mobility to allow discharge to the venue listed below.      Follow Up Recommendations SNF;Supervision/Assistance - 24 hour    Equipment Recommendations  None recommended by PT    Recommendations for Other Services       Precautions / Restrictions Precautions Precautions: Fall Precaution Comments: monitor O2 sats, states legs give out, has been  dropping sats more, needs Oxygen for ambulation now Restrictions Weight Bearing Restrictions: No      Mobility  Bed Mobility Overal bed mobility: Needs Assistance Bed Mobility: Supine to Sit Rolling: Mod assist Sidelying to sit: Mod assist Supine to sit: Mod assist     General bed mobility comments: Pt pulled up on PT needing mod assist to come to EOB.   Transfers Overall transfer level: Needs assistance Equipment used: Rolling walker (2 wheeled) Transfers: Sit to/from Stand;Lateral/Scoot Transfers Sit to Stand: Mod assist;From elevated surface        Lateral/Scoot Transfers: Min assist;Mod assist;From elevated  surface General transfer comment: Pt stood to RW however heavy posterior leana nd could not take pivotal steps safely with 1 asssit.  Sat back down and pt did lateral scoot to recliner with arm dropped with mod assist and cues.  Pt needed to do this incrementally and needed a lot of cues to lean forward and unweight bottom.   Ambulation/Gait                Stairs            Wheelchair Mobility    Modified Rankin (Stroke Patients Only)       Balance Overall balance assessment: Needs assistance Sitting-balance support: No upper extremity supported;Feet supported Sitting balance-Leahy Scale: Fair     Standing balance support: Bilateral upper extremity supported;During functional activity Standing balance-Leahy Scale: Poor Standing balance comment: reliant on external support and on RW                             Pertinent Vitals/Pain Pain Assessment: No/denies pain    Home Living Family/patient expects to be discharged to:: Skilled nursing facility                 Additional Comments: [place]    Prior Function Level of Independence: Needs assistance   Gait / Transfers Assistance Needed: Pt modified independent with transfer to w/c and using w/c. Amb up to 100' with rolling walker and assist  ADL's / Homemaking Assistance Needed: Pt reports setup assist/intermittent supervision for bathing; some  assist for LB dressing        Hand Dominance   Dominant Hand: Right    Extremity/Trunk Assessment   Upper Extremity Assessment Upper Extremity Assessment: Defer to OT evaluation    Lower Extremity Assessment Lower Extremity Assessment: RLE deficits/detail;LLE deficits/detail RLE Deficits / Details: grossly 3-/5 LLE Deficits / Details: grossly 3-/5    Cervical / Trunk Assessment Cervical / Trunk Assessment: Kyphotic  Communication   Communication: HOH  Cognition Arousal/Alertness: Awake/alert Behavior During Therapy: Flat  affect Overall Cognitive Status: Impaired/Different from baseline Area of Impairment: Following commands;Problem solving;Awareness;Safety/judgement;Attention                 Orientation Level: Time;Situation Current Attention Level: Sustained   Following Commands: Follows one step commands inconsistently;Follows one step commands with increased time Safety/Judgement: Decreased awareness of deficits Awareness: Intellectual Problem Solving: Slow processing;Decreased initiation;Difficulty sequencing;Requires verbal cues;Requires tactile cues        General Comments General comments (skin integrity, edema, etc.): O2 on 2L >90%.     Exercises General Exercises - Lower Extremity Long Arc Quad: AROM;Seated;10 reps Hip Flexion/Marching: AROM;Seated;10 reps   Assessment/Plan    PT Assessment Patient needs continued PT services  PT Problem List Decreased strength;Decreased activity tolerance;Decreased balance;Decreased mobility;Obesity       PT Treatment Interventions DME instruction;Gait training;Functional mobility training;Therapeutic activities;Therapeutic exercise;Balance training;Patient/family education    PT Goals (Current goals can be found in the Care Plan section)  Acute Rehab PT Goals Patient Stated Goal: to go to [facility] PT Goal Formulation: With patient Time For Goal Achievement: 01/05/19 Potential to Achieve Goals: Good    Frequency Min 2X/week   Barriers to discharge        Co-evaluation               AM-PAC PT "6 Clicks" Mobility  Outcome Measure Help needed turning from your back to your side while in a flat bed without using bedrails?: A Lot Help needed moving from lying on your back to sitting on the side of a flat bed without using bedrails?: A Lot Help needed moving to and from a bed to a chair (including a wheelchair)?: A Lot Help needed standing up from a chair using your arms (e.g., wheelchair or bedside chair)?: A Lot Help needed to walk  in hospital room?: Total Help needed climbing 3-5 steps with a railing? : Total 6 Click Score: 10    End of Session Equipment Utilized During Treatment: Gait belt;Oxygen Activity Tolerance: Patient limited by fatigue Patient left: in chair;with call bell/phone within reach;with chair alarm set Nurse Communication: Mobility status PT Visit Diagnosis: Unsteadiness on feet (R26.81)    Time: 5883-2549 PT Time Calculation (min) (ACUTE ONLY): 23 min   Charges:   PT Evaluation $PT Eval Moderate Complexity: 1 Mod PT Treatments $Therapeutic Activity: 8-22 mins        [place] Pager:  [phone]  Office:  [place] 12/22/2018, 3:37 PM

## 2018-12-22 NOTE — Progress Notes (Addendum)
[facility] Progress Note    Assessment/ Plan:   83 year old gentleman with recent COVID-27 d/c from the facility 11/26/2018 after a diagnosis of COVID-19 infection. h/o CKD and prior to his hospitalization had a creatinine 1.3-2.0. On Nov 15, 2018 his creatinine was 1.83. 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 Home medications Norvasc 10 mg daily, Lipitor 20 mg daily Zyrtec 10 mg daily Trulicity 4.235 mcg weekly, Lexapro 5 mg daily gabapentin 100 mg 3 times daily metoprolol 25 mg daily multivitamins 1 daily, torsemide 40 mg daily Xarelto 50 mg daily. Found by renal ultrasound 12/19/2018 to have  bilateral moderate hydronephrosis.    Acute kidney injury. There appears to be no use of nephrotoxins. Vital signs appear to be stable with no evidence of systemic hypotension. Renal ultrasound consistent with bilateral hydronephrosis; AKI likely secondary to obstruction. Foley is already in place with good UOP. - Urine microscopy does not suggest acute glomerular injury or AIN.  Continue to avoid nephrotoxins ACE inhibitors and ARB's, NSAIDs and Cox 2 inhibitors, avoid IV contrast and intra-arterial procedures - Continues to improve renal function; he is in postobstructive diuresis. - No fluid restriction.  Giving another KDur 40 meq today 6/13.  Will sign off at this time; please reconsult as needed.   Hypertension/volume. Appears to be stable euvolemic.  Anemia stable no issue at this time  Hypokalemia being repleted will need to continue to follow I's and O's with close monitoring of potassium  Acid-base stable.  Diabetes mellitus as per primary team  Subjective:   Feeling better w/ no c/o dyspnea.   Objective:   BP 129/61   Pulse 64   Temp 98 F (36.7 C) (Oral)   Resp 20   Ht 5\' 11"  (1.803 m)   Wt (!) 148.8 kg   SpO2 98%   BMI 45.75 kg/m   Intake/Output Summary (Last 24 hours) at 12/22/2018 3614 Last data filed at 12/22/2018 0555 Gross  per 24 hour  Intake 1784.95 ml  Output 3950 ml  Net -2165.05 ml   Weight change:   Physical Exam: In an attempt to conserve PPE and minimize exposure I  reviewed provider notes in the chart. Foley that is draining well.  Imaging: Dg Chest Port 1 View  Result Date: 12/20/2018 CLINICAL DATA:  COVID-19 positivity EXAM: PORTABLE CHEST 1 VIEW COMPARISON:  11/16/2018 FINDINGS: Cardiac shadow is within normal limits. Previously seen patchy infiltrates have resolved when compare with the prior exam. No other focal abnormality is noted. IMPRESSION: Resolution of previously seen infiltrates. Electronically Signed   By: Inez Catalina M.D.   On: 12/20/2018 11:06    Labs: BMET Recent Labs  Lab 12/19/18 1221 12/20/18 0414 12/21/18 0847  NA 141 140 142  K 2.8* 3.1* 2.9*  CL 96* 96* 100  CO2 30 29 31   GLUCOSE 191* 148* 158*  BUN 90* 89* 81*  CREATININE 4.74* 4.40* 3.67*  CALCIUM 10.4* 9.9 9.6  PHOS  --   --  5.3*   CBC Recent Labs  Lab 12/19/18 1221 12/20/18 0414  WBC 5.1 4.0  HGB 13.2 12.4*  HCT 42.0 38.9*  MCV 89.2 88.0  PLT 215 207    Medications:    . apixaban  2.5 mg Oral BID  . Chlorhexidine Gluconate Cloth  6 each Topical Q0600  . docusate sodium  100 mg Oral QHS  . escitalopram  5 mg Oral Daily  . gabapentin  100 mg Oral TID  .  hydrocerin   Topical BID  . hydrocortisone   Rectal TID  . insulin aspart  0-20 Units Subcutaneous TID WC  . insulin aspart  0-5 Units Subcutaneous QHS  . insulin glargine  5 Units Subcutaneous QHS  . latanoprost  1 drop Both Eyes QHS  . loratadine  10 mg Oral Daily  . Melatonin  6 mg Oral QHS  . multivitamin with minerals  1 tablet Oral QPM  . polyethylene glycol  17 g Oral Daily  . tamsulosin  0.4 mg Oral QPC supper  . vitamin B-12  1,000 mcg Oral Daily      Otelia Santee, MD 12/22/2018, 9:28 AM

## 2018-12-23 ENCOUNTER — Encounter (HOSPITAL_COMMUNITY): Payer: Self-pay | Admitting: Internal Medicine

## 2018-12-23 DIAGNOSIS — I5032 Chronic diastolic (congestive) heart failure: Secondary | ICD-10-CM

## 2018-12-23 DIAGNOSIS — R001 Bradycardia, unspecified: Secondary | ICD-10-CM

## 2018-12-23 LAB — GLUCOSE, CAPILLARY
Glucose-Capillary: 118 mg/dL — ABNORMAL HIGH (ref 70–99)
Glucose-Capillary: 171 mg/dL — ABNORMAL HIGH (ref 70–99)
Glucose-Capillary: 148 mg/dL — ABNORMAL HIGH (ref 70–99)
Glucose-Capillary: 178 mg/dL — ABNORMAL HIGH (ref 70–99)

## 2018-12-23 LAB — BASIC METABOLIC PANEL
Anion gap: 10 (ref 5–15)
BUN: 51 mg/dL — ABNORMAL HIGH (ref 8–23)
CO2: 31 mmol/L (ref 22–32)
Calcium: 10 mg/dL (ref 8.9–10.3)
Chloride: 101 mmol/L (ref 98–111)
Creatinine, Ser: 2.49 mg/dL — ABNORMAL HIGH (ref 0.61–1.24)
GFR calc Af Amer: 27 mL/min — ABNORMAL LOW (ref >60)
GFR calc non Af Amer: 23 mL/min — ABNORMAL LOW (ref >60)
Glucose, Bld: 128 mg/dL — ABNORMAL HIGH (ref 70–99)
Potassium: 2.9 mmol/L — ABNORMAL LOW (ref 3.5–5.1)
Sodium: 142 mmol/L (ref 135–145)

## 2018-12-23 MED ORDER — POTASSIUM CHLORIDE CRYS ER 20 MEQ PO TBCR
40.0000 meq | EXTENDED_RELEASE_TABLET | Freq: Once | ORAL | Status: AC
  Administered 2018-12-23: 40 meq via ORAL
  Filled 2018-12-23: qty 2

## 2018-12-23 MED ORDER — POTASSIUM CHLORIDE 10 MEQ/100ML IV SOLN
10.0000 meq | INTRAVENOUS | Status: AC
  Administered 2018-12-23 (×4): 10 meq via INTRAVENOUS
  Filled 2018-12-23 (×5): qty 100

## 2018-12-23 NOTE — Progress Notes (Signed)
PROGRESS NOTE  Reginald [name]:[id number] DOB: September 01, 1935 DOA: 12/19/2018 PCP: Guadlupe Spanish, MD  HPI/Recap of past 24 hours: The patient is a 83 yr old man who presents to the ED after he was found to have a creatinine of 4.39. He was discharged from this facility on 11/26/2018 after a stay for COVID-19. At discharge his creatinine was 2.21. Prior to that illness it appears that his creatinine was less than 2.0. During his stay he had a modest increase in creatinine due to diuresis. At discharge it was recommended that his torsemide be held for 1 week, and that he have a recheck of his chemistry before restarting. The patient denies any difficulty eating or drinking. He denies difficulty starting a stream of urine. He states that he feels well. No new problems.   He carries a past medical history significant for  Atrial fibrillation, CHF (EF 10/2016 was 56 - 60%), CKD, COPD, CAD, Hyperlipidemia, OHS, OSA, DM II, and hypertension.  The hospitalist service was consulted to admit the patient for further evaluation and treatment.  ED Course: In the ED the patient's temp was 98.3, HR was 72, Blood pressure was 140/83. He was saturating 98% on 2 liters. He was given a 500 cc bolus.   12/22/18: Seen and examined at his bedside.  Still having intermittent A. fib with slow ventricular response despite holding off beta-blocker and calcium channel blockers.  Asymptomatic.  He denies any chest pain or dyspnea at rest.  On 2 L [place].  Cardiology has been consulted for management of this cardiac medications in the setting of A. fib with slow ventricular response.  12/23/18: Patient seen and examined at his bedside.  Reports worsening pain in his right ankle.  No history of gout.  If persists will obtain an x-ray.    Assessment/Plan: Principal Problem:   Acute renal failure superimposed on stage 4 chronic kidney disease (HCC) Active Problems:   Type 2 diabetes mellitus (HCC)   OSA (obstructive sleep  apnea)   Atrial fibrillation (HCC)   CAD (coronary artery disease), native coronary artery   Hyperlipidemia   Hypokalemia   HTN (hypertension)   Acute on chronic renal failure (HCC)  AKI on CKD 3 likely post renal from BPH Baseline creatinine appears to be 1.8 with GFR 33 Presented with creatinine of 4.74 and GFR of 11 Renal ultrasound showed bilateral hydronephrosis + Urinary retention, Foley catheter placed on 12/20/2018. Continue to avoid nephrotoxins Continue to monitor urine output -7.6 L since admission Receiving gentle IV fluid hydration normal saline at 75 cc/h Renal function continues to improve with creatinine of 2.49 from 2.88<<3.67<<4.40. Will need to follow-up with neurology  Permanent A. fib with slow ventricular response Consulted cardiology to further assess on 12/22/2018 and 6/14. Continue to hold off beta-blocker and calcium channel blocker until seen by cardiology. Continue with on Eliquis on 12/21/2018, continue Was previously on Xarelto which was switched to Eliquis due to renal insufficiency Continue to hold off beta-blocker and calcium channel blocker due to intermittent bradycardia Defer to cardiology to restart cardiac meds  Hypokalemia, persistent despite repletion Potassium today 2.9 Replete with IV potassium 10 mEq x 4. Presented with potassium of 2.8 Replete cautiously in the setting of AKI Continue to replete until normalized Magnesium 1.9 on 12/22/2018 Repeat BMP in the morning  Right ankle pain, unclear etiology Denies history of gout If persistent will obtain an x-ray  Generalized weakness/physical debility PT assessed and recommended SNF CSW consulted to assist  with placement. Fall precautions.  Recent COVID-19 infection Recently admitted and discharged 11/26/2018 with positive COVID-19 test Independently reviewed chest x-ray from last admission which showed bilateral infiltrates suggestive of COVID-19 infection Repeat COVID-19 test done  on 12/19/2018+ Transferred to 2 W. pulmonary COVID-19 floor. Maintain O2 saturation greater than 92% Continue albuterol inhaler 3 times daily Independently reviewed chest x-ray done on 12/20/2018 which showed improving aeration. Patient denies dyspnea. Continue to monitor  Acute hypoxic respiratory failure likely secondary to COPD versus COVID-19 infection On 2 L oxygen saturating 97% Obtain home O2 evaluation  Maintain O2 saturation greater than 42%  Chronic diastolic CHF Last 2D echo done in 2018 showed LVEF 55 to 60% Continue to hold torsemide due to AKI Currently on IV fluids 75 cc/h normal saline Monitor fluid status Continue daily weights and strict I's and O's  2D echo done on 12/20/2018 showed normal LVEF 6065% with grade 2 diastolic dysfunction  Type 2 diabetes with complication CKD Continue insulin sliding scale Last hemoglobin A1c was 8.3 on 11/16/18  COPD: Appears stable.  Inhalers as needed.  CAD: Denies chest pain.  Continue home medications.  Hypertension: Home metoprolol and Norvasc due to intermittent bradycardia  Morbid obesity BMI 45 Recommend weight loss outpatient with regular physical activity and healthy dieting     Code Status: Full code  Family Communication: We will call family if is okay with the patient  Disposition Plan: Home possibly in 1 to 2 days when cardiology signs off..   Consultants:  Nephrology, Dr. Augustin Coupe.  Signed off on 12/22/2018.  Cardiology consulted on 12/22/2018.  Procedures:  None  Antimicrobials:  None  DVT prophylaxis: Subcu heparin 3 times daily   Objective: Vitals:   12/23/18 0200 12/23/18 0300 12/23/18 0400 12/23/18 0821  BP:    (!) 128/54  Pulse:    75  Resp: 16 14 16 18   Temp:    97.8 F (36.6 C)  TempSrc:    Oral  SpO2:    95%  Weight:      Height:        Intake/Output Summary (Last 24 hours) at 12/23/2018 1216 Last data filed at 12/23/2018 0900 Gross per 24 hour  Intake 920 ml  Output 3975 ml   Net -3055 ml   Filed Weights   12/19/18 1215 12/20/18 0446 12/21/18 0500  Weight: (!) 140.6 kg (!) 141.2 kg (!) 148.8 kg    Exam:  . General: 83 y.o. year-old male obese in no acute distress.  Alert and oriented x3.   . Cardiovascular: Regular rate and rhythm with no rubs or gallops.  No JVD or thyromegaly.   Marland Kitchen Respiratory: Clear to Auscultation with No Wheezes or Rales.  Poor Inspiratory Effort.   . Abdomen: Morbidly obese with no tenderness on palpation.  Normal bowel sounds. . Musculoskeletal: Lymphedema.  Lower extremities wrapped with compression stockings.  Mild tenderness with palpation of right ankle. Marland Kitchen Psychiatry: Mood is appropriate for condition and setting.   Data Reviewed: CBC: Recent Labs  Lab 12/19/18 1221 12/20/18 0414  WBC 5.1 4.0  HGB 13.2 12.4*  HCT 42.0 38.9*  MCV 89.2 88.0  PLT 215 683   Basic Metabolic Panel: Recent Labs  Lab 12/19/18 1221 12/20/18 0414 12/21/18 0847 12/22/18 0757 12/23/18 0631  NA 141 140 142 141 142  K 2.8* 3.1* 2.9* 2.9* 2.9*  CL 96* 96* 100 102 101  CO2 30 29 31 27 31   GLUCOSE 191* 148* 158* 148* 128*  BUN 90* 89*  81* 64* 51*  CREATININE 4.74* 4.40* 3.67* 2.88* 2.49*  CALCIUM 10.4* 9.9 9.6 9.8 10.0  MG  --   --  2.0 1.9  --   PHOS  --   --  5.3*  --   --    GFR: Estimated Creatinine Clearance: 35 mL/min (A) (by C-G formula based on SCr of 2.49 mg/dL (H)). Liver Function Tests: Recent Labs  Lab 12/20/18 0414 12/21/18 0847  AST 28  --   ALT 29  --   ALKPHOS 49  --   BILITOT 0.5  --   PROT 5.4*  --   ALBUMIN 3.2* 2.7*   No results for input(s): LIPASE, AMYLASE in the last 168 hours. No results for input(s): AMMONIA in the last 168 hours. Coagulation Profile: No results for input(s): INR, PROTIME in the last 168 hours. Cardiac Enzymes: No results for input(s): CKTOTAL, CKMB, CKMBINDEX, TROPONINI in the last 168 hours. BNP (last 3 results) No results for input(s): PROBNP in the last 8760 hours. HbA1C: No  results for input(s): HGBA1C in the last 72 hours. CBG: Recent Labs  Lab 12/22/18 1138 12/22/18 1638 12/22/18 2136 12/23/18 0818 12/23/18 1211  GLUCAP 170* 174* 164* 118* 171*   Lipid Profile: No results for input(s): CHOL, HDL, LDLCALC, TRIG, CHOLHDL, LDLDIRECT in the last 72 hours. Thyroid Function Tests: No results for input(s): TSH, T4TOTAL, FREET4, T3FREE, THYROIDAB in the last 72 hours. Anemia Panel: No results for input(s): VITAMINB12, FOLATE, FERRITIN, TIBC, IRON, RETICCTPCT in the last 72 hours. Urine analysis:    Component Value Date/Time   COLORURINE YELLOW 12/20/2018 1754   APPEARANCEUR CLEAR 12/20/2018 1754   LABSPEC 1.008 12/20/2018 1754   PHURINE 5.0 12/20/2018 1754   GLUCOSEU NEGATIVE 12/20/2018 1754   HGBUR NEGATIVE 12/20/2018 1754   BILIRUBINUR NEGATIVE 12/20/2018 1754   KETONESUR NEGATIVE 12/20/2018 1754   PROTEINUR NEGATIVE 12/20/2018 1754   UROBILINOGEN 1.0 05/13/2015 1549   NITRITE NEGATIVE 12/20/2018 1754   LEUKOCYTESUR NEGATIVE 12/20/2018 1754   Sepsis Labs: @LABRCNTIP (procalcitonin:4,lacticidven:4)  ) Recent Results (from the past 240 hour(s))  SARS Coronavirus 2     Status: Abnormal   Collection Time: 12/19/18  6:18 PM  Result Value Ref Range Status   SARS Coronavirus 2 DETECTED (A) NOT DETECTED Final    Comment: RESULT CALLED TO, READ BACK BY AND VERIFIED WITH: Lendell Caprice RN 2008 12/19/18 A BROWNING (NOTE) SARS-CoV-2 target nucleic acids are DETECTED. The SARS-CoV-2 RNA is generally detectable in upper and lower respiratory specimens during the acute phase of infection. Positive results are indicative of active infection with SARS-CoV-2. Clinical  correlation with patient history and other diagnostic information is necessary to determine patient infection status. Positive results do  not rule out bacterial infection or co-infection with other viruses. The expected result is Not Detected. Fact Sheet for Patients:  http://www.biofiredefense.com/wp-content/uploads/2020/03/BIOFIRE-COVID -19-patients.pdf Fact Sheet for Healthcare Providers: http://www.biofiredefense.com/wp-content/uploads/2020/03/BIOFIRE-COVID -19-hcp.pdf This test is not yet approved or cleared by the [place] and  has been authorized for detection and/or diagnosis of SARS-CoV-2 by FDA under an Emergency  Use Authorization (EUA).  This EUA will remain in effect (meaning this test can be used) for the duration of  the COVID-19 declaration under Section 564(b)(1) of the Act, 21 U.S.C. section [phone] 3(b)(1), unless the authorization is terminated or revoked sooner. Performed at [facility], [address], [place], [place] [address]       Studies: No results found.  Scheduled Meds: . apixaban  2.5 mg Oral BID  .  Chlorhexidine Gluconate Cloth  6 each Topical Q0600  . docusate sodium  100 mg Oral QHS  . escitalopram  5 mg Oral Daily  . gabapentin  100 mg Oral TID  . hydrocerin   Topical BID  . hydrocortisone   Rectal TID  . insulin aspart  0-20 Units Subcutaneous TID WC  . insulin aspart  0-5 Units Subcutaneous QHS  . insulin glargine  5 Units Subcutaneous QHS  . latanoprost  1 drop Both Eyes QHS  . loratadine  10 mg Oral Daily  . Melatonin  6 mg Oral QHS  . multivitamin with minerals  1 tablet Oral QPM  . polyethylene glycol  17 g Oral Daily  . tamsulosin  0.4 mg Oral QPC supper  . vitamin B-12  1,000 mcg Oral Daily    Continuous Infusions: . sodium chloride 75 mL/hr at 12/23/18 0619     LOS: 4 days     Kayleen Memos, MD [facility] Pager [phone]  If 7PM-7AM, please contact night-coverage [email or web address] Password [email or web address] 12/23/2018, 12:16 PM

## 2018-12-23 NOTE — TOC Initial Note (Signed)
Transition of Care [facility]) - Initial/Assessment Note    Patient Details  Name: Reginald [name] MRN: [id number] Date of Birth: [birth year]  Transition of Care [facility] CM/SW Contact:    Alberteen Sam, [place] Phone Number: [phone] 12/23/2018, 10:37 AM  Clinical Narrative:                  CSW consulted with patient's son [name] regarding discharge planning. [name] familiar with discharge planning from working with [place] last month when patient was at [facility]. [name] agrees with patient returning to [facility] when medically stable to discharge, as patient has been living there for over a year. CSW informed [name] discharge would potentially be in 1-2 days given cardiology's sign off. [name] is happy that his father is doing better. CSW will continue to follow for discharge planning.   Expected Discharge Plan: Skilled Nursing Facility Barriers to Discharge: Continued Medical Work up   Patient Goals and CMS Choice   CMS Medicare.gov Compare Post Acute Care list provided to:: Patient Represenative (must comment)(Marcus (son)) Choice offered to / list presented to : Adult Children(Marcus (son))  Expected Discharge Plan and Services Expected Discharge Plan: [place]       Living arrangements for the past 2 months: Skilled Nursing Facility([facility] for over a year)                                      Prior Living Arrangements/Services Living arrangements for the past 2 months: Skilled Nursing Facility([facility] for over a year) Lives with:: Self Patient language and need for interpreter reviewed:: Yes Do you feel safe going back to the place where you live?: Yes      Need for Family Participation in Patient Care: Yes (Comment) Care giver support system in place?: Yes (comment)   Criminal Activity/Legal Involvement Pertinent to Current Situation/Hospitalization: No - Comment as needed  Activities of Daily Living Home Assistive Devices/Equipment:  Wheelchair ADL Screening (condition at time of admission) Patient's cognitive ability adequate to safely complete daily activities?: Yes Is the patient deaf or have difficulty hearing?: No Does the patient have difficulty seeing, even when wearing glasses/contacts?: No Does the patient have difficulty concentrating, remembering, or making decisions?: No Patient able to express need for assistance with ADLs?: Yes Does the patient have difficulty dressing or bathing?: Yes Independently performs ADLs?: No Does the patient have difficulty walking or climbing stairs?: Yes Weakness of Legs: Both Weakness of Arms/Hands: Both  Permission Sought/Granted Permission sought to share information with : Case Manager, Customer service manager, Family Supports Permission granted to share information with : Yes, Verbal Permission Granted  Share Information with NAME: [name]  Permission granted to share info w AGENCY: SNFs  Permission granted to share info w Relationship: son  Permission granted to share info w Contact Information: [phone]  Emotional Assessment Appearance:: Other (Comment Required(unable to assess) Attitude/Demeanor/Rapport: Unable to Assess Affect (typically observed): Unable to Assess Orientation: : Oriented to Self, Oriented to Place, Oriented to Situation Alcohol / Substance Use: Not Applicable Psych Involvement: No (comment)  Admission diagnosis:  Hypokalemia [E87.6] AKI (acute kidney injury) ([place]) [N17.9] Acute kidney injury (AKI) with acute tubular necrosis (ATN) ([place]) [N17.0] Patient Active Problem List   Diagnosis Date Noted  . Acute on chronic renal failure ([place]) 12/19/2018  . Acute renal failure superimposed on stage 4 chronic kidney disease ([place]) 12/19/2018  . AKI (acute kidney  injury) ([place]) 11/26/2018  . HCAP (healthcare-associated pneumonia) 11/15/2018  . Acute respiratory disease due to COVID-19 virus 11/15/2018  . Chronic venous insufficiency  10/04/2017  . Venous stasis ulcers of both lower extremities ([place]) 10/04/2017  . Nonspecific chest pain 10/27/2016  . Morbid obesity ([place]) 05/02/2016  . Lymphedema 05/02/2016  . Abnormal laboratory test result 10/15/2015  . Cough 10/08/2015  . Hematuria 09/21/2015  . Hypokalemia 08/13/2015  . HTN (hypertension) 08/13/2015  . Multiple open wounds of lower leg 07/22/2015  . CKD (chronic kidney disease) stage 4, GFR 15-29 ml/min (HCC) 07/22/2015  . Cellulitis of leg, right 06/28/2015  . ARF (acute renal failure) ([place]) 06/28/2015  . Hypertensive heart disease with CHF (congestive heart failure) ([place]) 05/19/2015  . CAD (coronary artery disease), native coronary artery 05/19/2015  . Hyperlipidemia 05/19/2015  . Vitamin D deficiency 05/19/2015  . Pressure ulcer 05/16/2015  . Diastolic dysfunction with chronic heart failure ([place]) 05/08/2015  . Blisters of multiple sites 05/08/2015  . Type 2 diabetes mellitus ([place])   . Obesity, unspecified   . OSA (obstructive sleep apnea)   . Atrial fibrillation ([place])   . Secondary DM with CKD stage 4 and hypertension ([place])   . Glaucoma   . Cellulitis 05/07/2015   PCP:  Guadlupe Spanish, MD Pharmacy:   [facility], [address] [place] [address] Phone: [phone] Fax: [phone]     Social Determinants of Health (SDOH) Interventions    Readmission Risk Interventions No flowsheet data found.

## 2018-12-23 NOTE — Consult Note (Signed)
ELECTROPHYSIOLOGY CONSULT NOTE - TELEHEALTH      Electrophysiology TeleHealth Note   Due to national recommendations of social distancing due to [place] 19, an Audio  telehealth visit is felt to be most appropriate for this patient at this time.  He is COVID +.  Verbal patient consent was obtained for this visit today.   Date:  12/23/2018   ID:  Reginald [name], DOB [birth year], MRN [id number]  Location: 2W28 Provider location: [place] Date: 12/19/2018 Evaluation Performed: New patient consult  PCP:  Guadlupe Spanish, MD  Cardiologist:  Dr Rockey Situ  Chief Complaint:  bradycardia  History of Present Illness:    Reginald [name] is a 83 y.o. male who presents via audio conferencing for a telehealth visit today.   The patient is referred for new inpatient consultation regarding asymptomatic bradycardia by Dr Nevada Crane.  The patients chart has been reviewed and I have discussed with him by phone. He is well known to Dr Rockey Situ and has permanent, rate controlled afib.  He also has chronic diastolic dysfunction.   This current admissions has been complicated by worsening renal failure and COVID 19.  He has had some urinary retention for which foley was required.  He has had hypokalemia.  He has been noted to have some bradycardia for which his beta blocker and calcium channel blocker have been held.  He has been asymptomatic with this. His primary concerns appear to be generalized weakness/ debility.  PT has advised SNF.  Today, he denies symptoms of palpitations, chest pain, shortness of breath, orthopnea, PND, lower extremity edema, claudication, dizziness, presyncope, syncope, bleeding, or neurologic sequela. The patient is tolerating medications without difficulties and is otherwise without complaint today.     Past Medical History:  Diagnosis Date  . Anteroseptal myocardial infarction ([place])   . CHF (congestive heart failure) ([place])   . Chronic kidney disease   . COPD  (chronic obstructive pulmonary disease) ([place])   . Coronary atherosclerosis of unspecified type of vessel, native or graft    a. s/p prior PCI to LAD in 1997  . Diverticulosis   . Glaucoma   . Hypercholesteremia   . Hypertensive renal disease   . Hypokalemia   . Obesity hypoventilation syndrome ([place])   . Obesity, unspecified   . OSA (obstructive sleep apnea)   . Permanent atrial fibrillation   . Rhabdomyolysis   . Type II or unspecified type diabetes mellitus without mention of complication, not stated as uncontrolled   . Unspecified essential hypertension   . Vestibulopathy     Past Surgical History:  Procedure Laterality Date  . APPENDECTOMY  1985  . CARDIAC CATHETERIZATION    . CHOLECYSTECTOMY  06/2000  . CORONARY ANGIOPLASTY  08/14/1995   stent placement to LAD   . ENDOVENOUS ABLATION SAPHENOUS VEIN W/ LASER Left 02/21/2018   endovenous laser ablation L GSV by Ruta Hinds MD   . [place] Right 1985  . KNEE ARTHROSCOPY Left 1991  . LUMBAR FUSION    . NOSE SURGERY  1970s   Per Dr. Terrence Dupont [facility] in pt chart    . apixaban  2.5 mg Oral BID  . Chlorhexidine Gluconate Cloth  6 each Topical Q0600  . docusate sodium  100 mg Oral QHS  . escitalopram  5 mg Oral Daily  . gabapentin  100 mg Oral TID  . hydrocerin   Topical BID  . hydrocortisone   Rectal TID  . insulin aspart  0-20 Units  Subcutaneous TID WC  . insulin aspart  0-5 Units Subcutaneous QHS  . insulin glargine  5 Units Subcutaneous QHS  . latanoprost  1 drop Both Eyes QHS  . loratadine  10 mg Oral Daily  . Melatonin  6 mg Oral QHS  . multivitamin with minerals  1 tablet Oral QPM  . polyethylene glycol  17 g Oral Daily  . tamsulosin  0.4 mg Oral QPC supper  . vitamin B-12  1,000 mcg Oral Daily   . sodium chloride 75 mL/hr at 12/23/18 2683    Allergies:   Adhesive [tape]   Social History:  The patient  reports that he has never smoked. He has never used smokeless tobacco. He reports that he does  not drink alcohol or use drugs.   Family History:  The patient's  family history includes Breast cancer in his maternal aunt; COPD in his mother; Colon cancer in his maternal aunt; Diabetes in his father, paternal grandmother, and sister; Heart disease in his brother, brother, father, and mother; Ovarian cancer in his daughter.    ROS:  Please see the history of present illness.   All other systems are personally reviewed and negative.    Exam:   Telemetry:  Afib, V rates mostly 60s.  No prolonged pauses or significant bradycardias Vitals:   12/23/18 0200 12/23/18 0300 12/23/18 0400 12/23/18 0821  BP:    (!) 128/54  Pulse:    75  Resp: 16 14 16 18   Temp:    97.8 F (36.6 C)  TempSrc:    Oral  SpO2:    95%  Weight:      Height:        Well sounding, he has some difficulty hearing   Labs/Other Tests and Data Reviewed:    Recent Labs: Lab Results  Component Value Date   WBC 4.0 12/20/2018   HGB 12.4 (L) 12/20/2018   HCT 38.9 (L) 12/20/2018   MCV 88.0 12/20/2018   PLT 207 12/20/2018    Recent Labs  Lab 12/20/18 0414  12/23/18 0631  NA 140   < > 142  K 3.1*   < > 2.9*  CL 96*   < > 101  CO2 29   < > 31  BUN 89*   < > 51*  CREATININE 4.40*   < > 2.49*  CALCIUM 9.9   < > 10.0  PROT 5.4*  --   --   BILITOT 0.5  --   --   ALKPHOS 49  --   --   ALT 29  --   --   AST 28  --   --   GLUCOSE 148*   < > 128*   < > = values in this interval not displayed.   EKG-  Rate controlled afib,   Echo: EF 60%, moderate MR, mild to moderate TR  Other studies personally reviewed: Additional studies/ records that were reviewed today include: hospitalist records,  Dr Gwenyth Ober prior notes  Review of the above records today demonstrates: as above    ASSESSMENT & PLAN:    1.  Asymptomatic bradycardia No prolonged pauses or significant brady arrhythmias No indication for pacing currently V rates will likely improve as he becomes ambulatory. Resume home toprol 25mg  daily with hold  parameters for HR < 60 bpm.  2. Permanent afib Asymptomatic and rate controlled On eliquis, will need to adjust based on renal function  3. Chronic diastolic dysfunction Admitted with advanced renal disease Torsemide is  on hold IM to determine when to resume.  May benefit from reducing dose to 20mg  daily  No further inpatient CV workup planned Follow-up with Dr Rockey Situ with telehealth visit after discharge (we will arrange)  Patient Risk:  after full review of this patients clinical status, I feel that they are at moderate risk at this time.  Today, I have spent 25 minutes with the patient with telehealth technology discussing afib and bradycardia.    Cardiology team to see as needed while here. Please call with questions.   Signed, Thompson Grayer MD, [place] 12/23/2018 1:24 PM   [place] [phone] (office) [phone] (fax)

## 2018-12-23 NOTE — NC FL2 (Signed)
[place] LEVEL OF CARE SCREENING TOOL     IDENTIFICATION  Patient Name: Reginald [name] Birthdate: 15-Mar-1936 Sex: male Admission Date (Current Location): 12/19/2018  [facility] and [place] Number:  Herbalist and Address:  [facility], [place] [address] [place], [place] [address]      Provider Number: [id number]  Attending Physician Name and Address:  Kayleen Memos, DO  Relative Name and Phone Number:  [name] (son) [phone]    Current Level of Care: Hospital Recommended Level of Care: [place] Prior Approval Number:    Date Approved/Denied: 05/12/06 PASRR Number: [id number]  Discharge Plan: SNF    Current Diagnoses: Patient Active Problem List   Diagnosis Date Noted  . Acute on chronic renal failure ([place]) 12/19/2018  . Acute renal failure superimposed on stage 4 chronic kidney disease ([place]) 12/19/2018  . AKI (acute kidney injury) ([place]) 11/26/2018  . HCAP (healthcare-associated pneumonia) 11/15/2018  . Acute respiratory disease due to COVID-19 virus 11/15/2018  . Chronic venous insufficiency 10/04/2017  . Venous stasis ulcers of both lower extremities ([place]) 10/04/2017  . Nonspecific chest pain 10/27/2016  . Morbid obesity ([place]) 05/02/2016  . Lymphedema 05/02/2016  . Abnormal laboratory test result 10/15/2015  . Cough 10/08/2015  . Hematuria 09/21/2015  . Hypokalemia 08/13/2015  . HTN (hypertension) 08/13/2015  . Multiple open wounds of lower leg 07/22/2015  . CKD (chronic kidney disease) stage 4, GFR 15-29 ml/min (HCC) 07/22/2015  . Cellulitis of leg, right 06/28/2015  . ARF (acute renal failure) ([place]) 06/28/2015  . Hypertensive heart disease with CHF (congestive heart failure) ([place]) 05/19/2015  . CAD (coronary artery disease), native coronary artery 05/19/2015  . Hyperlipidemia 05/19/2015  . Vitamin D deficiency 05/19/2015  . Pressure ulcer 05/16/2015  . Diastolic dysfunction with chronic heart  failure ([place]) 05/08/2015  . Blisters of multiple sites 05/08/2015  . Type 2 diabetes mellitus ([place])   . Obesity, unspecified   . OSA (obstructive sleep apnea)   . Atrial fibrillation ([place])   . Secondary DM with CKD stage 4 and hypertension ([place])   . Glaucoma   . Cellulitis 05/07/2015    Orientation RESPIRATION BLADDER Height & Weight     Self, Time, Situation, Place  O2(2 L/min nasal cannula) Incontinent, External catheter Weight: (!) 328 lb 0.7 oz (148.8 kg) Height:  5\' 11"  (180.3 cm)  BEHAVIORAL SYMPTOMS/MOOD NEUROLOGICAL BOWEL NUTRITION STATUS      Continent Diet(see discharge summary)  AMBULATORY STATUS COMMUNICATION OF NEEDS Skin   Limited Assist Verbally Other (Comment), PU Stage and Appropriate Care(MASD on breast, perineum, and abdome, stage II pressure ulcer on the buttocks)                       Personal Care Assistance Level of Assistance  Bathing, Feeding, Dressing, Total care(front wheel walker) Bathing Assistance: Limited assistance Feeding assistance: Independent Dressing Assistance: Limited assistance Total Care Assistance: Limited assistance   Functional Limitations Info  Sight, Hearing, Speech Sight Info: Impaired(wears glasses) Hearing Info: Adequate Speech Info: Adequate    SPECIAL CARE FACTORS FREQUENCY  PT (By licensed PT), OT (By licensed OT)     PT Frequency: min 5x weekly OT Frequency: min 5x weekly            Contractures Contractures Info: Not present    Additional Factors Info  Code Status, Allergies, Isolation Precautions Code Status Info: full Allergies Info: Adhesive (tape)     Isolation Precautions  Info: MRSA, emerging pathogen air and contact precautions     Current Medications (12/23/2018):  This is the current hospital active medication list Current Facility-Administered Medications  Medication Dose Route Frequency Provider Last Rate Last Dose  . 0.9 %  sodium chloride infusion   Intravenous Continuous Blanchie Dessert, MD 75 mL/hr at 12/23/18 0619    . acetaminophen (TYLENOL) tablet 650 mg  650 mg Oral Q6H PRN Blanchie Dessert, MD   650 mg at 12/22/18 1830   Or  . acetaminophen (TYLENOL) suppository 650 mg  650 mg Rectal Q6H PRN Blanchie Dessert, MD      . apixaban (ELIQUIS) tablet 2.5 mg  2.5 mg Oral BID Karren Cobble, RPH   2.5 mg at 12/23/18 4562  . calcium carbonate (dosed in mg elemental calcium) suspension 500 mg of elemental calcium  500 mg of elemental calcium Oral Q6H PRN Blanchie Dessert, MD      . camphor-menthol (SARNA) lotion 1 application  1 application Topical B6L PRN Blanchie Dessert, MD       And  . hydrOXYzine (ATARAX/VISTARIL) tablet 25 mg  25 mg Oral Q8H PRN Blanchie Dessert, MD      . Chlorhexidine Gluconate Cloth 2 % PADS 6 each  6 each Topical Q0600 Kayleen Memos, DO   6 each at 12/23/18 [phone]  . docusate sodium (COLACE) capsule 100 mg  100 mg Oral QHS Swayze, Ava, DO   100 mg at 12/22/18 2215  . docusate sodium (ENEMEEZ) enema 283 mg  1 enema Rectal PRN Blanchie Dessert, MD      . escitalopram (LEXAPRO) tablet 5 mg  5 mg Oral Daily Swayze, Ava, DO   5 mg at 12/23/18 0838  . feeding supplement (NEPRO CARB STEADY) liquid 237 mL  237 mL Oral TID PRN Blanchie Dessert, MD      . gabapentin (NEURONTIN) capsule 100 mg  100 mg Oral TID Swayze, Ava, DO   100 mg at 12/23/18 0838  . hydrocerin (EUCERIN) cream   Topical BID Swayze, Ava, DO      . hydrocortisone (ANUSOL-HC) 2.5 % rectal cream   Rectal TID Swayze, Ava, DO   1 application at 34/28/76 2219  . insulin aspart (novoLOG) injection 0-20 Units  0-20 Units Subcutaneous TID WC Schorr, Rhetta Mura, NP   4 Units at 12/22/18 1640  . insulin aspart (novoLOG) injection 0-5 Units  0-5 Units Subcutaneous QHS Schorr, Rhetta Mura, NP      . insulin glargine (LANTUS) injection 5 Units  5 Units Subcutaneous QHS Schorr, Rhetta Mura, NP   5 Units at 12/22/18 2215  . latanoprost (XALATAN) 0.005 % ophthalmic solution 1 drop  1 drop Both  Eyes QHS Swayze, Ava, DO   1 drop at 12/22/18 2217  . loratadine (CLARITIN) tablet 10 mg  10 mg Oral Daily Swayze, Ava, DO   10 mg at 12/23/18 0838  . Melatonin TABS 6 mg  6 mg Oral QHS Swayze, Ava, DO   6 mg at 12/22/18 2214  . multivitamin with minerals tablet 1 tablet  1 tablet Oral QPM Swayze, Ava, DO   1 tablet at 12/22/18 1640  . nitroGLYCERIN (NITROSTAT) SL tablet 0.4 mg  0.4 mg Sublingual Q5 min PRN Swayze, Ava, DO      . ondansetron (ZOFRAN) tablet 4 mg  4 mg Oral Q6H PRN Blanchie Dessert, MD       Or  . ondansetron (ZOFRAN) injection 4 mg  4 mg Intravenous Q6H PRN Plunkett, Whitney,  MD      . polyethylene glycol (MIRALAX / GLYCOLAX) packet 17 g  17 g Oral Daily Schorr, Rhetta Mura, NP   17 g at 12/23/18 0837  . sorbitol 70 % solution 30 mL  30 mL Oral PRN Blanchie Dessert, MD   30 mL at 12/23/18 0619  . tamsulosin (FLOMAX) capsule 0.4 mg  0.4 mg Oral QPC supper Irene Pap N, DO   0.4 mg at 12/22/18 1640  . vitamin B-12 (CYANOCOBALAMIN) tablet 1,000 mcg  1,000 mcg Oral Daily Swayze, Ava, DO   1,000 mcg at 12/23/18 0838  . zolpidem (AMBIEN) tablet 5 mg  5 mg Oral QHS PRN Blanchie Dessert, MD         Discharge Medications: Please see discharge summary for a list of discharge medications.  Relevant Imaging Results:  Relevant Lab Results:   Additional Information SSN: [id number]  [place] Sam, LCSW

## 2018-12-24 LAB — GLUCOSE, CAPILLARY
Glucose-Capillary: 140 mg/dL — ABNORMAL HIGH (ref 70–99)
Glucose-Capillary: 198 mg/dL — ABNORMAL HIGH (ref 70–99)
Glucose-Capillary: 164 mg/dL — ABNORMAL HIGH (ref 70–99)
Glucose-Capillary: 181 mg/dL — ABNORMAL HIGH (ref 70–99)

## 2018-12-24 LAB — BASIC METABOLIC PANEL
Anion gap: 10 (ref 5–15)
BUN: 42 mg/dL — ABNORMAL HIGH (ref 8–23)
CO2: 27 mmol/L (ref 22–32)
Calcium: 9.9 mg/dL (ref 8.9–10.3)
Chloride: 104 mmol/L (ref 98–111)
Creatinine, Ser: 2.27 mg/dL — ABNORMAL HIGH (ref 0.61–1.24)
GFR calc Af Amer: 30 mL/min — ABNORMAL LOW (ref >60)
GFR calc non Af Amer: 26 mL/min — ABNORMAL LOW (ref >60)
Glucose, Bld: 148 mg/dL — ABNORMAL HIGH (ref 70–99)
Potassium: 3.5 mmol/L (ref 3.5–5.1)
Sodium: 141 mmol/L (ref 135–145)

## 2018-12-24 MED ORDER — METOPROLOL SUCCINATE ER 25 MG PO TB24
25.0000 mg | ORAL_TABLET | Freq: Every day | ORAL | Status: DC
  Administered 2018-12-24 – 2018-12-25 (×2): 25 mg via ORAL
  Filled 2018-12-24 (×2): qty 1

## 2018-12-24 MED ORDER — METOPROLOL SUCCINATE ER 25 MG PO TB24: 25.0000 mg | ORAL_TABLET | Freq: Every day | ORAL | Status: DC

## 2018-12-24 MED ORDER — FLEET ENEMA 7-19 GM/118ML RE ENEM: 1.0000 | ENEMA | Freq: Once | RECTAL | Status: DC | PRN

## 2018-12-24 NOTE — Progress Notes (Signed)
Physical Therapy Treatment Patient Details Name: Reginald [name] MRN: [id number] DOB: 11-14-1935 Today's Date: 12/24/2018    History of Present Illness The patient is a 83 yr old man who presents to the ED after he was found to have a creatinine of 4.39. He was discharged from this facility on 11/26/2018 after a stay for COVID-19. He carries a past medical history significant for  Atrial fibrillation, CHF (EF 10/2016 was 45 - 60%), CKD, COPD, CAD, Hyperlipidemia, OHS, OSA, DM II, and hypertension.    PT Comments    Continuing work on functional mobility and activity tolerance;  Assisted pt OOB today with +2 assistance and use of lateral scoot transfer; Mod/Max assist to help him to the chair; tended to drift into posterior lean, but able to correct with multimodal cues; Noted likely for dc to SNF tomorrow  Follow Up Recommendations  SNF;Supervision/Assistance - 24 hour     Equipment Recommendations  None recommended by PT    Recommendations for Other Services       Precautions / Restrictions Precautions Precautions: Fall Precaution Comments: monitor O2 sats, states legs give out, has been  dropping sats more, needs Oxygen for ambulation now    Mobility  Bed Mobility Overal bed mobility: Needs Assistance Bed Mobility: Rolling;Sidelying to Sit Rolling: Mod assist Sidelying to sit: +2 for physical assistance;Mod assist       General bed mobility comments: Rolled for hygeine assist after BM; +2 heavy mod assist to elevate trunk from sidelying to sit  Transfers Overall transfer level: Needs assistance Equipment used: 2 person hand held assist(and bed pad) Transfers: Lateral/Scoot Transfers;Sit to/from Stand Sit to Stand: Max assist        Lateral/Scoot Transfers: Mod assist;Max assist;+2 physical assistance;+2 safety/equipment General transfer comment: Less success with attemtps at sit to stand today; Performed lateral scoot with 2 person assist; lots of cues to keep weight  forward over feet to unweigh hips for scooting, as he tended to lean back  Ambulation/Gait                 Stairs             Wheelchair Mobility    Modified Rankin (Stroke Patients Only)       Balance     Sitting balance-Leahy Scale: Fair     Standing balance support: Bilateral upper extremity supported;During functional activity Standing balance-Leahy Scale: Zero                              Cognition Arousal/Alertness: Awake/alert Behavior During Therapy: Flat affect Overall Cognitive Status: Within Functional Limits for tasks assessed(for simple mobility tasks)                                        Exercises      General Comments General comments (skin integrity, edema, etc.): On 2 L supplemental O2 and sats remained greater than or equal to 90%      Pertinent Vitals/Pain Pain Assessment: No/denies pain    Home Living                      Prior Function            PT Goals (current goals can now be found in the care plan section) Acute Rehab PT Goals Patient Stated Goal: to  go to [place] PT Goal Formulation: With patient Time For Goal Achievement: 01/05/19 Potential to Achieve Goals: Good Progress towards PT goals: Progressing toward goals(slowly)    Frequency    Min 2X/week      PT Plan Current plan remains appropriate    Co-evaluation              AM-PAC PT "6 Clicks" Mobility   Outcome Measure  Help needed turning from your back to your side while in a flat bed without using bedrails?: A Lot Help needed moving from lying on your back to sitting on the side of a flat bed without using bedrails?: A Lot Help needed moving to and from a bed to a chair (including a wheelchair)?: A Lot Help needed standing up from a chair using your arms (e.g., wheelchair or bedside chair)?: A Lot Help needed to walk in hospital room?: Total Help needed climbing 3-5 steps with a railing? : Total 6  Click Score: 10    End of Session Equipment Utilized During Treatment: Gait belt;Oxygen Activity Tolerance: Patient limited by fatigue Patient left: in chair;with call bell/phone within reach;with chair alarm set Nurse Communication: Mobility status;Need for lift equipment(consider lift equipment) PT Visit Diagnosis: Unsteadiness on feet (R26.81)     Time: 1352-1430 PT Time Calculation (min) (ACUTE ONLY): 38 min  Charges:  $Therapeutic Activity: 38-52 mins                     Roney Marion, PT  Acute Rehabilitation Services Pager [phone] Office [place] 12/24/2018, 3:56 PM

## 2018-12-24 NOTE — Progress Notes (Signed)
CSW called and spoke with [place] at [facility]. They are able to take the patient back.   Please send discharge summary and SNF transfer summary on Tuesday. Contact [facility] to get room number. Patient will need PTAR.   CSW will continue to follow and assist with discharge planning.   Domenic Schwab, MSW, [place]

## 2018-12-24 NOTE — Progress Notes (Signed)
PROGRESS NOTE  Reginald [name] BDZ:[id number] DOB: 05-Jun-1936 DOA: 12/19/2018 PCP: Guadlupe Spanish, MD  HPI/Recap of past 24 hours: The patient is a 83 yr old man who presents to the ED after he was found to have a creatinine of 4.39. He was discharged from this facility on 11/26/2018 after a stay for COVID-19. At discharge his creatinine was 2.21. Prior to that illness it appears that his creatinine was less than 2.0. During his stay he had a modest increase in creatinine due to diuresis. At discharge it was recommended that his torsemide be held for 1 week, and that he have a recheck of his chemistry before restarting. The patient denies any difficulty eating or drinking. He denies difficulty starting a stream of urine. He states that he feels well. No new problems.   He carries a past medical history significant for  Atrial fibrillation, CHF (EF 10/2016 was 83 - 60%), CKD, COPD, CAD, Hyperlipidemia, OHS, OSA, DM II, and hypertension.  The hospitalist service was consulted to admit the patient for further evaluation and treatment.  ED Course: In the ED the patient's temp was 98.3, HR was 72, Blood pressure was 140/83. He was saturating 98% on 2 liters. He was given a 500 cc bolus due to his AKI.   Hospital course complicated by ntermittent A. fib with slow ventricular response while holding off beta-blocker and calcium channel blockers. Cardiology consulted rec to resume toprol xl 25 mg daily and hold for HR< 60 bpm. No further CV workup planned.  12/24/18: Patient seen and examined at his bedside.  Right ankle pain is resolved.  Denies chest pain or shortness of breath at rest.  Continues to require at least 2 L of oxygen by nasal cannula to maintain O2 saturation greater than 90%.   Assessment/Plan: Principal Problem:   Acute renal failure superimposed on stage 4 chronic kidney disease (HCC) Active Problems:   Type 2 diabetes mellitus (HCC)   OSA (obstructive sleep apnea)   Atrial  fibrillation (HCC)   CAD (coronary artery disease), native coronary artery   Hyperlipidemia   Hypokalemia   HTN (hypertension)   Acute on chronic renal failure (HCC)  AKI on CKD 3 likely post renal from BPH Baseline creatinine appears to be 1.8 with GFR 33 Presented with creatinine of 4.74 and GFR of 11 Renal ultrasound showed bilateral hydronephrosis + Urinary retention, Foley catheter placed on 12/20/2018. Continue to avoid nephrotoxins Continue to monitor urine output Net I&O - 7.3L since admission Diuretic have been held and received gentle Iv fluid during this admission Renal function continues to improve with creatinine of 2.27<<2.49<<2.88<<3.67<<4.40. Will need to follow-up with urology  Urinary retention/bilateral hydronephrosis Foley cath placed on 12/20/18, also started on tamsulosin same day. Will need voiding trial prior to dc or referral to urology for voiding trial outpatient  Permanent A. fib with slow ventricular response Consulted cardiology to further assess on 12/22/2018 and 6/14. Resume home toprol 25mg  daily with hold parameters for HR < 60 bpm per cardiology Continue Eliquis Was previously on Xarelto which was switched to Eliquis due to renal insufficiency  Resolved Hypokalemia post repletion Magnesium 1.9 on 12/22/2018 Replete as indicated Repeat BMP in the morning  Resolved Right ankle pain, unclear etiology Denies history of gout  Generalized weakness/physical debility PT assessed and recommended SNF CSW consulted to assist with placement. Plan to return to [facility] when medically stable. Fall precautions.  Recent COVID-19 infection Recently admitted and discharged 11/26/2018 with positive COVID-19  test Independently reviewed chest x-ray from last admission which showed bilateral infiltrates suggestive of COVID-19 infection Repeat COVID-19 test done on 12/19/2018+ Maintain O2 saturation greater than 92% Continue albuterol inhaler 3 times daily  Independently reviewed chest x-ray done on 12/20/2018 which showed improving aeration. Patient denies dyspnea. Continue to monitor  Acute hypoxic respiratory failure likely secondary to COPD versus COVID-19 infection On 2 L oxygen saturating 97% Obtain home O2 evaluation  Maintain O2 saturation greater than 66%  Chronic diastolic CHF Last 2D echo done in 2018 showed LVEF 55 to 60% Torsemide held due to AKI Continue to Monitor fluid status Continue daily weights and strict I's and O's  2D echo done on 12/20/2018 showed normal LVEF 6065% with grade 2 diastolic dysfunction  Type 2 diabetes with complication CKD Continue insulin sliding scale Last hemoglobin A1c was 8.3 on 11/16/18  COPD: Appears stable. Management as stated above  CAD: Denies chest pain.  Continue home medications.  Hypertension: Home metoprolol.  Morbid obesity BMI 45 Recommend weight loss outpatient with regular physical activity and healthy dieting Continue PT at SNF   Code Status: Full code   Disposition Plan: Possibly in 1 to 2 days to SNF when close to his baseline. Has foley in place due to urinary retention. Started on tamsulosin during this admission. Voiding trial or follow up with urology outpatient for voiding trial.   Consultants:  Nephrology, Dr. Augustin Coupe.  Signed off on 12/22/2018.  Cardiology consulted on 12/22/2018, signed off on 12/23/18.  Procedures:  None  Antimicrobials:  None  DVT prophylaxis: Subcu heparin 3 times daily   Objective: Vitals:   12/24/18 0400 12/24/18 0500 12/24/18 0726 12/24/18 1225  BP:   (!) 141/74 (!) 142/71  Pulse:   87 98  Resp:   16   Temp:   98.4 F (36.9 C) 99 F (37.2 C)  TempSrc:   Oral Oral  SpO2: 96% 92% 95% 91%  Weight:      Height:        Intake/Output Summary (Last 24 hours) at 12/24/2018 1337 Last data filed at 12/24/2018 1230 Gross per 24 hour  Intake 4171.4 ml  Output 3875 ml  Net 296.4 ml   Filed Weights   12/19/18 1215 12/20/18  0446 12/21/18 0500  Weight: (!) 140.6 kg (!) 141.2 kg (!) 148.8 kg    Exam:  . General: 83 y.o. year-old male morbidly obese.  Very hard of hearing.  In no acute distress.  Alert and oriented x3.   . Cardiovascular: IRRR no rubs or gallops. No JVD. Marland Kitchen Respiratory: Clear to auscultation no wheezes or rales.  Poor inspiratory effort. . Abdomen: Morbidly obese nontender on palpation.  Bowel sounds present.  . Musculoskeletal: Lymphedema in lower extremities.  Wrapped in compression stockings.. . Psychiatry: Mood is appropriate for condition and setting.   Data Reviewed: CBC: Recent Labs  Lab 12/19/18 1221 12/20/18 0414  WBC 5.1 4.0  HGB 13.2 12.4*  HCT 42.0 38.9*  MCV 89.2 88.0  PLT 215 599   Basic Metabolic Panel: Recent Labs  Lab 12/20/18 0414 12/21/18 0847 12/22/18 0757 12/23/18 0631 12/24/18 0645  NA 140 142 141 142 141  K 3.1* 2.9* 2.9* 2.9* 3.5  CL 96* 100 102 101 104  CO2 29 31 27 31 27   GLUCOSE 148* 158* 148* 128* 148*  BUN 89* 81* 64* 51* 42*  CREATININE 4.40* 3.67* 2.88* 2.49* 2.27*  CALCIUM 9.9 9.6 9.8 10.0 9.9  MG  --  2.0 1.9  --   --  PHOS  --  5.3*  --   --   --    GFR: Estimated Creatinine Clearance: 38.4 mL/min (A) (by C-G formula based on SCr of 2.27 mg/dL (H)). Liver Function Tests: Recent Labs  Lab 12/20/18 0414 12/21/18 0847  AST 28  --   ALT 29  --   ALKPHOS 49  --   BILITOT 0.5  --   PROT 5.4*  --   ALBUMIN 3.2* 2.7*   No results for input(s): LIPASE, AMYLASE in the last 168 hours. No results for input(s): AMMONIA in the last 168 hours. Coagulation Profile: No results for input(s): INR, PROTIME in the last 168 hours. Cardiac Enzymes: No results for input(s): CKTOTAL, CKMB, CKMBINDEX, TROPONINI in the last 168 hours. BNP (last 3 results) No results for input(s): PROBNP in the last 8760 hours. HbA1C: No results for input(s): HGBA1C in the last 72 hours. CBG: Recent Labs  Lab 12/23/18 1211 12/23/18 1752 12/23/18 2125 12/24/18  0720 12/24/18 1223  GLUCAP 171* 148* 178* 140* 198*   Lipid Profile: No results for input(s): CHOL, HDL, LDLCALC, TRIG, CHOLHDL, LDLDIRECT in the last 72 hours. Thyroid Function Tests: No results for input(s): TSH, T4TOTAL, FREET4, T3FREE, THYROIDAB in the last 72 hours. Anemia Panel: No results for input(s): VITAMINB12, FOLATE, FERRITIN, TIBC, IRON, RETICCTPCT in the last 72 hours. Urine analysis:    Component Value Date/Time   COLORURINE YELLOW 12/20/2018 1754   APPEARANCEUR CLEAR 12/20/2018 1754   LABSPEC 1.008 12/20/2018 1754   PHURINE 5.0 12/20/2018 1754   GLUCOSEU NEGATIVE 12/20/2018 1754   HGBUR NEGATIVE 12/20/2018 1754   BILIRUBINUR NEGATIVE 12/20/2018 1754   KETONESUR NEGATIVE 12/20/2018 1754   PROTEINUR NEGATIVE 12/20/2018 1754   UROBILINOGEN 1.0 05/13/2015 1549   NITRITE NEGATIVE 12/20/2018 1754   LEUKOCYTESUR NEGATIVE 12/20/2018 1754   Sepsis Labs: @LABRCNTIP (procalcitonin:4,lacticidven:4)  ) Recent Results (from the past 240 hour(s))  SARS Coronavirus 2     Status: Abnormal   Collection Time: 12/19/18  6:18 PM  Result Value Ref Range Status   SARS Coronavirus 2 DETECTED (A) NOT DETECTED Final    Comment: RESULT CALLED TO, READ BACK BY AND VERIFIED WITH: Lendell Caprice RN 2008 12/19/18 A BROWNING (NOTE) SARS-CoV-2 target nucleic acids are DETECTED. The SARS-CoV-2 RNA is generally detectable in upper and lower respiratory specimens during the acute phase of infection. Positive results are indicative of active infection with SARS-CoV-2. Clinical  correlation with patient history and other diagnostic information is necessary to determine patient infection status. Positive results do  not rule out bacterial infection or co-infection with other viruses. The expected result is Not Detected. Fact Sheet for Patients: http://www.biofiredefense.com/wp-content/uploads/2020/03/BIOFIRE-COVID -19-patients.pdf Fact Sheet for Healthcare Providers:  http://www.biofiredefense.com/wp-content/uploads/2020/03/BIOFIRE-COVID -19-hcp.pdf This test is not yet approved or cleared by the [place] and  has been authorized for detection and/or diagnosis of SARS-CoV-2 by FDA under an Emergency  Use Authorization (EUA).  This EUA will remain in effect (meaning this test can be used) for the duration of  the COVID-19 declaration under Section 564(b)(1) of the Act, 21 U.S.C. section [phone] 3(b)(1), unless the authorization is terminated or revoked sooner. Performed at [facility], [place] [address], [place], [place] [address]       Studies: No results found.  Scheduled Meds: . apixaban  2.5 mg Oral BID  . Chlorhexidine Gluconate Cloth  6 each Topical Q0600  . docusate sodium  100 mg Oral QHS  . escitalopram  5 mg Oral Daily  . gabapentin  100 mg Oral TID  . hydrocerin   Topical BID  . hydrocortisone   Rectal TID  . insulin aspart  0-20 Units Subcutaneous TID WC  . insulin aspart  0-5 Units Subcutaneous QHS  . insulin glargine  5 Units Subcutaneous QHS  . latanoprost  1 drop Both Eyes QHS  . loratadine  10 mg Oral Daily  . Melatonin  6 mg Oral QHS  . metoprolol succinate  25 mg Oral Daily  . multivitamin with minerals  1 tablet Oral QPM  . polyethylene glycol  17 g Oral Daily  . tamsulosin  0.4 mg Oral QPC supper  . vitamin B-12  1,000 mcg Oral Daily    Continuous Infusions:    LOS: 5 days     Kayleen Memos, MD [facility] Pager [phone]  If 7PM-7AM, please contact night-coverage [email or web address] Password [facility] 12/24/2018, 1:37 PM

## 2018-12-25 DIAGNOSIS — Z743 Need for continuous supervision: Secondary | ICD-10-CM | POA: Diagnosis not present

## 2018-12-25 DIAGNOSIS — I5032 Chronic diastolic (congestive) heart failure: Secondary | ICD-10-CM | POA: Diagnosis not present

## 2018-12-25 DIAGNOSIS — E538 Deficiency of other specified B group vitamins: Secondary | ICD-10-CM | POA: Diagnosis not present

## 2018-12-25 DIAGNOSIS — N1330 Unspecified hydronephrosis: Secondary | ICD-10-CM | POA: Diagnosis not present

## 2018-12-25 DIAGNOSIS — R600 Localized edema: Secondary | ICD-10-CM | POA: Diagnosis not present

## 2018-12-25 DIAGNOSIS — J9601 Acute respiratory failure with hypoxia: Secondary | ICD-10-CM | POA: Diagnosis not present

## 2018-12-25 DIAGNOSIS — N183 Chronic kidney disease, stage 3 (moderate): Secondary | ICD-10-CM | POA: Diagnosis not present

## 2018-12-25 DIAGNOSIS — I5031 Acute diastolic (congestive) heart failure: Secondary | ICD-10-CM | POA: Diagnosis not present

## 2018-12-25 DIAGNOSIS — N178 Other acute kidney failure: Secondary | ICD-10-CM | POA: Diagnosis not present

## 2018-12-25 DIAGNOSIS — H401234 Low-tension glaucoma, bilateral, indeterminate stage: Secondary | ICD-10-CM | POA: Diagnosis not present

## 2018-12-25 DIAGNOSIS — R5381 Other malaise: Secondary | ICD-10-CM | POA: Diagnosis not present

## 2018-12-25 DIAGNOSIS — R338 Other retention of urine: Secondary | ICD-10-CM | POA: Diagnosis not present

## 2018-12-25 DIAGNOSIS — H524 Presbyopia: Secondary | ICD-10-CM | POA: Diagnosis not present

## 2018-12-25 DIAGNOSIS — J984 Other disorders of lung: Secondary | ICD-10-CM | POA: Diagnosis not present

## 2018-12-25 DIAGNOSIS — W19XXXA Unspecified fall, initial encounter: Secondary | ICD-10-CM | POA: Diagnosis not present

## 2018-12-25 DIAGNOSIS — R41841 Cognitive communication deficit: Secondary | ICD-10-CM | POA: Diagnosis not present

## 2018-12-25 DIAGNOSIS — I130 Hypertensive heart and chronic kidney disease with heart failure and stage 1 through stage 4 chronic kidney disease, or unspecified chronic kidney disease: Secondary | ICD-10-CM | POA: Diagnosis not present

## 2018-12-25 DIAGNOSIS — R52 Pain, unspecified: Secondary | ICD-10-CM | POA: Diagnosis not present

## 2018-12-25 DIAGNOSIS — N184 Chronic kidney disease, stage 4 (severe): Secondary | ICD-10-CM | POA: Diagnosis not present

## 2018-12-25 DIAGNOSIS — E876 Hypokalemia: Secondary | ICD-10-CM | POA: Diagnosis not present

## 2018-12-25 DIAGNOSIS — E119 Type 2 diabetes mellitus without complications: Secondary | ICD-10-CM | POA: Diagnosis not present

## 2018-12-25 DIAGNOSIS — H43811 Vitreous degeneration, right eye: Secondary | ICD-10-CM | POA: Diagnosis not present

## 2018-12-25 DIAGNOSIS — M6281 Muscle weakness (generalized): Secondary | ICD-10-CM | POA: Diagnosis not present

## 2018-12-25 DIAGNOSIS — H348111 Central retinal vein occlusion, right eye, with retinal neovascularization: Secondary | ICD-10-CM | POA: Diagnosis not present

## 2018-12-25 DIAGNOSIS — R262 Difficulty in walking, not elsewhere classified: Secondary | ICD-10-CM | POA: Diagnosis not present

## 2018-12-25 DIAGNOSIS — D464 Refractory anemia, unspecified: Secondary | ICD-10-CM | POA: Diagnosis not present

## 2018-12-25 DIAGNOSIS — H2513 Age-related nuclear cataract, bilateral: Secondary | ICD-10-CM | POA: Diagnosis not present

## 2018-12-25 DIAGNOSIS — N179 Acute kidney failure, unspecified: Secondary | ICD-10-CM | POA: Diagnosis not present

## 2018-12-25 DIAGNOSIS — E568 Deficiency of other vitamins: Secondary | ICD-10-CM | POA: Diagnosis not present

## 2018-12-25 DIAGNOSIS — E113393 Type 2 diabetes mellitus with moderate nonproliferative diabetic retinopathy without macular edema, bilateral: Secondary | ICD-10-CM | POA: Diagnosis not present

## 2018-12-25 DIAGNOSIS — E7849 Other hyperlipidemia: Secondary | ICD-10-CM | POA: Diagnosis not present

## 2018-12-25 DIAGNOSIS — R32 Unspecified urinary incontinence: Secondary | ICD-10-CM | POA: Diagnosis not present

## 2018-12-25 DIAGNOSIS — U071 COVID-19: Secondary | ICD-10-CM | POA: Diagnosis not present

## 2018-12-25 DIAGNOSIS — R278 Other lack of coordination: Secondary | ICD-10-CM | POA: Diagnosis not present

## 2018-12-25 DIAGNOSIS — R634 Abnormal weight loss: Secondary | ICD-10-CM | POA: Diagnosis not present

## 2018-12-25 DIAGNOSIS — G9341 Metabolic encephalopathy: Secondary | ICD-10-CM | POA: Diagnosis not present

## 2018-12-25 DIAGNOSIS — J189 Pneumonia, unspecified organism: Secondary | ICD-10-CM | POA: Diagnosis not present

## 2018-12-25 DIAGNOSIS — W1789XA Other fall from one level to another, initial encounter: Secondary | ICD-10-CM | POA: Diagnosis not present

## 2018-12-25 DIAGNOSIS — M5489 Other dorsalgia: Secondary | ICD-10-CM | POA: Diagnosis not present

## 2018-12-25 DIAGNOSIS — I10 Essential (primary) hypertension: Secondary | ICD-10-CM | POA: Diagnosis not present

## 2018-12-25 DIAGNOSIS — R2681 Unsteadiness on feet: Secondary | ICD-10-CM | POA: Diagnosis not present

## 2018-12-25 DIAGNOSIS — J449 Chronic obstructive pulmonary disease, unspecified: Secondary | ICD-10-CM | POA: Diagnosis not present

## 2018-12-25 DIAGNOSIS — I129 Hypertensive chronic kidney disease with stage 1 through stage 4 chronic kidney disease, or unspecified chronic kidney disease: Secondary | ICD-10-CM | POA: Diagnosis not present

## 2018-12-25 DIAGNOSIS — R279 Unspecified lack of coordination: Secondary | ICD-10-CM | POA: Diagnosis not present

## 2018-12-25 DIAGNOSIS — Z9181 History of falling: Secondary | ICD-10-CM | POA: Diagnosis not present

## 2018-12-25 DIAGNOSIS — H25042 Posterior subcapsular polar age-related cataract, left eye: Secondary | ICD-10-CM | POA: Diagnosis not present

## 2018-12-25 DIAGNOSIS — B349 Viral infection, unspecified: Secondary | ICD-10-CM | POA: Diagnosis not present

## 2018-12-25 DIAGNOSIS — R2689 Other abnormalities of gait and mobility: Secondary | ICD-10-CM | POA: Diagnosis not present

## 2018-12-25 DIAGNOSIS — I480 Paroxysmal atrial fibrillation: Secondary | ICD-10-CM | POA: Diagnosis not present

## 2018-12-25 DIAGNOSIS — R339 Retention of urine, unspecified: Secondary | ICD-10-CM | POA: Diagnosis not present

## 2018-12-25 DIAGNOSIS — I2510 Atherosclerotic heart disease of native coronary artery without angina pectoris: Secondary | ICD-10-CM | POA: Diagnosis not present

## 2018-12-25 DIAGNOSIS — H25013 Cortical age-related cataract, bilateral: Secondary | ICD-10-CM | POA: Diagnosis not present

## 2018-12-25 LAB — BASIC METABOLIC PANEL
Anion gap: 7 (ref 5–15)
BUN: 35 mg/dL — ABNORMAL HIGH (ref 8–23)
CO2: 29 mmol/L (ref 22–32)
Calcium: 9.9 mg/dL (ref 8.9–10.3)
Chloride: 105 mmol/L (ref 98–111)
Creatinine, Ser: 2.07 mg/dL — ABNORMAL HIGH (ref 0.61–1.24)
GFR calc Af Amer: 34 mL/min — ABNORMAL LOW (ref >60)
GFR calc non Af Amer: 29 mL/min — ABNORMAL LOW (ref >60)
Glucose, Bld: 127 mg/dL — ABNORMAL HIGH (ref 70–99)
Potassium: 3.5 mmol/L (ref 3.5–5.1)
Sodium: 141 mmol/L (ref 135–145)

## 2018-12-25 LAB — GLUCOSE, CAPILLARY
Glucose-Capillary: 135 mg/dL — ABNORMAL HIGH (ref 70–99)
Glucose-Capillary: 163 mg/dL — ABNORMAL HIGH (ref 70–99)

## 2018-12-25 MED ORDER — TAMSULOSIN HCL 0.4 MG PO CAPS: 0.4000 mg | ORAL_CAPSULE | Freq: Every day | ORAL | 0 refills | Status: DC

## 2018-12-25 MED ORDER — APIXABAN 2.5 MG PO TABS: 2.5000 mg | ORAL_TABLET | Freq: Two times a day (BID) | ORAL | 0 refills | Status: DC

## 2018-12-25 MED ORDER — ATORVASTATIN CALCIUM 10 MG PO TABS: 20.0000 mg | ORAL_TABLET | Freq: Every day | ORAL | Status: DC

## 2018-12-25 MED ORDER — TORSEMIDE 20 MG PO TABS: 20.0000 mg | ORAL_TABLET | Freq: Every day | ORAL | 0 refills | Status: DC

## 2018-12-25 NOTE — Discharge Instructions (Signed)
Person Under Monitoring Name: Reginald [name]  Location: [place] [address]   CORONAVIRUS DISEASE 2019 (COVID-19) Guidance for Persons Under Investigation You are being tested for the virus that causes coronavirus disease 2019 (COVID-19). Public health actions are necessary to ensure protection of your health and the health of others, and to prevent further spread of infection. COVID-19 is caused by a virus that can cause symptoms, such as fever, cough, and shortness of breath. The primary transmission from person to person is by coughing or sneezing. On August 09, 2018, the [place] announced a [employer] Emergency of International Concern and on August 10, 2018 the [place] Department of Health and Human Services declared a public health emergency. If the virus that causesCOVID-19 spreads in the community, it could have severe public health consequences.  As a person under investigation for COVID-19, the [facility] advises you to adhere to the following guidance until your test results are reported to you. If your test result is positive, you will receive additional information from your provider and your local health department at that time.   Remain at home until you are cleared by your health provider or public health authorities.   Keep a log of visitors to your home using the form provided. Any visitors to your home must be aware of your isolation status.  If you plan to move to a new address or leave the county, notify the local health department in your county.  Call a doctor or seek care if you have an urgent medical need. Before seeking medical care, call ahead and get instructions from the provider before arriving at the medical office, clinic or hospital. Notify them that you are being tested for the virus that causes COVID-19 so arrangements can be made, as necessary, to  prevent transmission to others in the healthcare setting. Next, notify the local health department in your county.  If a medical emergency arises and you need to call 911, inform the first responders that you are being tested for the virus that causes COVID-19. Next, notify the local health department in your county.  Adhere to all guidance set forth by the [facility] for [facility] of patients that is based on guidance from the Center for Disease Control and Prevention with suspected or confirmed COVID-19. It is provided with this guidance for Persons Under Investigation.  Your health and the health of our community are our top priorities. Public Health officials remain available to provide assistance and counseling to you about COVID-19 and compliance with this guidance.  Provider: ____________________________________________________________ Date: ______/_____/_________  By signing below, you acknowledge that you have read and agree to comply with this Guidance for Persons Under Investigation. ______________________________________________________________ Date: ______/_____/_________  WHO DO I CALL? You can find a list of local health departments here: [email or web address] Health Department: ____________________________________________________________________ Contact Name: ________________________________________________________________________ Telephone: ___________________________________________________________________________  Marice Potter, [place], Communicable Disease Branch COVID-19 Guidance for Persons Under Investigation September 15, 2018   Person Under Monitoring Name: Reginald [name]  Location: [address] [place] [address]   Infection Prevention Recommendations for Individuals Confirmed to have, or Being Evaluated for, 2019 Novel Coronavirus (COVID-19) Infection  Who Receive Care at Home  Individuals who are confirmed to have, or are being evaluated for, COVID-19 should follow the prevention steps below until a healthcare provider or local or state  health department says they can return to normal activities.  Stay home except to get medical care You should restrict activities outside your home, except for getting medical care. Do not go to work, school, or public areas, and do not use public transportation or taxis.  Call ahead before visiting your doctor Before your medical appointment, call the healthcare provider and tell them that you have, or are being evaluated for, COVID-19 infection. This will help the healthcare providers office take steps to keep other people from getting infected. Ask your healthcare provider to call the local or state health department.  Monitor your symptoms Seek prompt medical attention if your illness is worsening (e.g., difficulty breathing). Before going to your medical appointment, call the healthcare provider and tell them that you have, or are being evaluated for, COVID-19 infection. Ask your healthcare provider to call the local or state health department.  Wear a facemask You should wear a facemask that covers your nose and mouth when you are in the same room with other people and when you visit a healthcare provider. People who live with or visit you should also wear a facemask while they are in the same room with you.  Separate yourself from other people in your home As much as possible, you should stay in a different room from other people in your home. Also, you should use a separate bathroom, if available.  Avoid sharing household items You should not share dishes, drinking glasses, cups, eating utensils, towels, bedding, or other items with other people in your home. After using these items, you should wash them thoroughly with soap and water.  Cover your coughs and sneezes Cover your mouth and  nose with a tissue when you cough or sneeze, or you can cough or sneeze into your sleeve. Throw used tissues in a lined trash can, and immediately wash your hands with soap and water for at least 20 seconds or use an alcohol-based hand rub.  Wash your [facility] your hands often and thoroughly with soap and water for at least 20 seconds. You can use an alcohol-based hand sanitizer if soap and water are not available and if your hands are not visibly dirty. Avoid touching your eyes, nose, and mouth with unwashed hands.   Prevention Steps for Caregivers and Household Members of Individuals Confirmed to have, or Being Evaluated for, COVID-19 Infection Being Cared for in the Home  If you live with, or provide care at home for, a person confirmed to have, or being evaluated for, COVID-19 infection please follow these guidelines to prevent infection:  Follow healthcare providers instructions Make sure that you understand and can help the patient follow any healthcare provider instructions for all care.  Provide for the patients basic needs You should help the patient with basic needs in the home and provide support for getting groceries, prescriptions, and other personal needs.  Monitor the patients symptoms If they are getting sicker, call his or her medical provider and tell them that the patient has, or is being evaluated for, COVID-19 infection. This will help the healthcare providers office take steps to keep other people from getting infected. Ask the healthcare provider to call the local or state health department.  Limit the number of people who have contact with the patient  If possible, have only one caregiver for the patient.  Other household members should stay in another home or place of residence. If this is not possible, they should stay  in another room,  or be separated from the patient as much as possible. Use a separate bathroom, if available.  Restrict visitors  who do not have an essential need to be in the home.  Keep older adults, very young children, and other sick people away from the patient Keep older adults, very young children, and those who have compromised immune systems or chronic health conditions away from the patient. This includes people with chronic heart, lung, or kidney conditions, diabetes, and cancer.  Ensure good ventilation Make sure that shared spaces in the home have good air flow, such as from an air conditioner or an opened window, weather permitting.  Wash your hands often  Wash your hands often and thoroughly with soap and water for at least 20 seconds. You can use an alcohol based hand sanitizer if soap and water are not available and if your hands are not visibly dirty.  Avoid touching your eyes, nose, and mouth with unwashed hands.  Use disposable paper towels to dry your hands. If not available, use dedicated cloth towels and replace them when they become wet.  Wear a facemask and gloves  Wear a disposable facemask at all times in the room and gloves when you touch or have contact with the patients blood, body fluids, and/or secretions or excretions, such as sweat, saliva, sputum, nasal mucus, vomit, urine, or feces.  Ensure the mask fits over your nose and mouth tightly, and do not touch it during use.  Throw out disposable facemasks and gloves after using them. Do not reuse.  Wash your hands immediately after removing your facemask and gloves.  If your personal clothing becomes contaminated, carefully remove clothing and launder. Wash your hands after handling contaminated clothing.  Place all used disposable facemasks, gloves, and other waste in a lined container before disposing them with other household waste.  Remove gloves and wash your hands immediately after handling these items.  Do not share dishes, glasses, or other household items with the patient  Avoid sharing household items. You should not  share dishes, drinking glasses, cups, eating utensils, towels, bedding, or other items with a patient who is confirmed to have, or being evaluated for, COVID-19 infection.  After the person uses these items, you should wash them thoroughly with soap and water.  Wash laundry thoroughly  Immediately remove and wash clothes or bedding that have blood, body fluids, and/or secretions or excretions, such as sweat, saliva, sputum, nasal mucus, vomit, urine, or feces, on them.  Wear gloves when handling laundry from the patient.  Read and follow directions on labels of laundry or clothing items and detergent. In general, wash and dry with the warmest temperatures recommended on the label.  Clean all areas the individual has used often  Clean all touchable surfaces, such as counters, tabletops, doorknobs, bathroom fixtures, toilets, phones, keyboards, tablets, and bedside tables, every day. Also, clean any surfaces that may have blood, body fluids, and/or secretions or excretions on them.  Wear gloves when cleaning surfaces the patient has come in contact with.  Use a diluted bleach solution (e.g., dilute bleach with 1 part bleach and 10 parts water) or a household disinfectant with a label that says EPA-registered for coronaviruses. To make a bleach solution at home, add 1 tablespoon of bleach to 1 quart (4 cups) of water. For a larger supply, add  cup of bleach to 1 gallon (16 cups) of water.  Read labels of cleaning products and follow recommendations provided on product labels. Labels contain instructions  for safe and effective use of the cleaning product including precautions you should take when applying the product, such as wearing gloves or eye protection and making sure you have good ventilation during use of the product.  Remove gloves and wash hands immediately after cleaning.  Monitor yourself for signs and symptoms of illness Caregivers and household members are considered close  contacts, should monitor their health, and will be asked to limit movement outside of the home to the extent possible. Follow the monitoring steps for close contacts listed on the symptom monitoring form.   ? If you have additional questions, contact your local health department or call the epidemiologist on call at [phone] (available 24/7). ? This guidance is subject to change. For the most up-to-date guidance from CDC, please refer to their website: YouBlogs.pl  Potassium Content of Foods  Potassium is a mineral found in many foods and drinks. It affects how the heart works, and helps keep fluids and minerals balanced in the body. The amount of potassium you need each day depends on your age and any medical conditions you may have. Talk to your health care provider or dietitian about how much potassium you need. The following lists of foods provide the general serving size for foods and the approximate amount of potassium in each serving, listed in milligrams (mg). Actual values may vary depending on the product and how it is processed. High in potassium The following foods and beverages have 200 mg or more of potassium per serving:  Apricots (raw) - 2 have 200 mg of potassium.  Apricots (dry) - 5 have 200 mg of potassium.  Artichoke - 1 medium has 345 mg of potassium.  Avocado -  fruit has 245 mg of potassium.  Banana - 1 medium fruit has 425 mg of potassium.  [place] or baked beans (canned) -  cup has 280 mg of potassium.  White beans (canned) -  cup has 595 mg potassium.  Beef roast - 3 oz has 320 mg of potassium.  Ground beef - 3 oz has 270 mg of potassium.  Beets (raw or cooked) -  cup has 260 mg of potassium.  Bran muffin - 2 oz has 300 mg of potassium.  Broccoli (cooked) -  cup has 230 mg of potassium.  Brussels sprouts -  cup has 250 mg of potassium.  Cantaloupe -  cup has 215 mg of  potassium.  Cereal, 100% bran -  cup has 200-400 mg of potassium.  Cheeseburger -1 single fast food burger has 225-400 mg of potassium.  Chicken - 3 oz has 220 mg of potassium.  Clams (canned) - 3 oz has 535 mg of potassium.  Crab - 3 oz has 225 mg of potassium.  Dates - 5 have 270 mg of potassium.  Dried beans and peas -  cup has 300-475 mg of potassium.  Figs (dried) - 2 have 260 mg of potassium.  Fish (halibut, tuna, cod, snapper) - 3 oz has 480 mg of potassium.  Fish (salmon, haddock, swordfish, perch) - 3 oz has 300 mg of potassium.  Fish (tuna, canned) - 3 oz has 200 mg of potassium.  [place] fries (fast food) - 3 oz has 470 mg of potassium.  Granola with fruit and nuts -  cup has 200 mg of potassium.  Grapefruit juice -  cup has 200 mg of potassium.  Honeydew melon -  cup has 200 mg of potassium.  Kale (raw) - 1 cup has 300 mg of  potassium.  Kiwi - 1 medium fruit has 240 mg of potassium.  Kohlrabi, rutabaga, parsnips -  cup has 280 mg of potassium.  Lentils -  cup has 365 mg of potassium.  Mango - 1 each has 325 mg of potassium.  Milk (nonfat, low-fat, whole, buttermilk) - 1 cup has 350-380 mg of potassium.  Milk (chocolate) - 1 cup has 420 mg of potassium  Molasses - 1 Tbsp has 295 mg of potassium.  Mushrooms -  cup has 280 mg of potassium.  Nectarine - 1 each has 275 mg of potassium.  Nuts (almonds, peanuts, hazelnuts, [place], cashew, mixed) - 1 oz has 200 mg of potassium.  Nuts (pistachios) - 1 oz has 295 mg of potassium.  Orange - 1 fruit has 240 mg of potassium.  Orange juice -  cup has 235 mg of potassium.  Papaya -  medium fruit has 390 mg of potassium.  Peanut butter (chunky) - 2 Tbsp has 240 mg of potassium.  Peanut butter (smooth) - 2 Tbsp has 210 mg of potassium.  Pear - 1 medium (200 mg) of potassium.  Pomegranate - 1 whole fruit has 400 mg of potassium.  Pomegranate juice -  cup has 215 mg of potassium.  Pork - 3 oz  has 350 mg of potassium.  Potato chips (salted) - 1 oz has 465 mg of potassium.  Potato (baked with skin) - 1 medium has 925 mg of potassium.  Potato (boiled) -  cup has 255 mg of potassium.  Potato (Mashed) -  cup has 330 mg of potassium.  Prune juice -  cup has 370 mg of potassium.  Prunes - 5 have 305 mg of potassium.  Pudding (chocolate) -  cup has 230 mg of potassium.  Pumpkin (canned) -  cup has 250 mg of potassium.  Raisins (seedless) -  cup has 270 mg of potassium.  Seeds (sunflower or pumpkin) - 1 oz has 240 mg of potassium.  Soy milk - 1 cup has 300 mg of potassium.  Spinach (cooked) - 1/2 cup has 420 mg of potassium.  Spinach (canned) -  cup has 370 mg of potassium.  Sweet potato (baked with skin) - 1 medium has 450 mg of potassium.  Swiss chard -  cup has 480 mg of potassium.  Tomato or vegetable juice -  cup has 275 mg of potassium.  Tomato (sauce or puree) -  cup has 400-550 mg of potassium.  Tomato (raw) - 1 medium has 290 mg of potassium.  Tomato (canned) -  cup has 200-300 mg of potassium.  [place] - 3 oz has 250 mg of potassium.  Wheat germ - 1 oz has 250 mg of potassium.  Winter squash -  cup has 250 mg of potassium.  Yogurt (plain or fruited) - 6 oz has 260-435 mg of potassium.  Zucchini -  cup has 220 mg of potassium. Moderate in potassium The following foods and beverages have 50-200 mg of potassium per serving:  Apple - 1 fruit has 150 mg of potassium  Apple juice -  cup has 150 mg of potassium  Applesauce -  cup has 90 mg of potassium  Apricot nectar -  cup has 140 mg of potassium  Asparagus (small spears) -  cup has 155 mg of potassium  Asparagus (large spears) - 6 have 155 mg of potassium  Bagel (cinnamon raisin) - 1 four-inch bagel has 130 mg of potassium  Bagel (egg or plain) - 1 four- inch  bagel has 70 mg of potassium  Beans (green) -  cup has 90 mg of potassium  Beans (yellow) -  cup has 190 mg of  potassium  Beer, regular - 12 oz has 100 mg of potassium  Beets (canned) -  cup has 125 mg of potassium  Blackberries -  cup has 115 mg of potassium  Blueberries -  cup has 60 mg of potassium  Bread (whole wheat) - 1 slice has 70 mg of potassium  Broccoli (raw) -  cup has 145 mg of potassium  Cabbage -  cup has 150 mg of potassium  Carrots (cooked or raw) -  cup has 180 mg of potassium  Cauliflower (raw) -  cup has 150 mg of potassium  Celery (raw) -  cup has 155 mg of potassium  Cereal, bran flakes -  cup has 120-150 mg of potassium  Cheese (cottage) -  cup has 110 mg of potassium  Cherries - 10 have 150 mg of potassium  Chocolate - 1 oz bar has 165 mg of potassium  Coffee (brewed) - 6 oz has 90 mg of potassium  Corn -  cup or 1 ear has 195 mg of potassium  Cucumbers -  cup has 80 mg of potassium  Egg - 1 large egg has 60 mg of potassium  Eggplant -  cup has 60 mg of potassium  Endive (raw) -  cup has 80 mg of potassium  English muffin - 1 has 65 mg of potassium  Fish (ocean perch) - 3 oz has 192 mg of potassium  Frankfurter, beef or pork - 1 has 75 mg of potassium  Fruit cocktail -  cup has 115 mg of potassium  Grape juice -  cup has 170 mg of potassium  Grapefruit -  fruit has 175 mg of potassium  Grapes -  cup has 155 mg of potassium  Greens: kale, turnip, collard -  cup has 110-150 mg of potassium  Ice cream or frozen yogurt (chocolate) -  cup has 175 mg of potassium  Ice cream or frozen yogurt (vanilla) -  cup has 120-150 mg of potassium  Lemons, limes - 1 each has 80 mg of potassium  Lettuce - 1 cup has 100 mg of potassium  Mixed vegetables -  cup has 150 mg of potassium  Mushrooms, raw -  cup has 110 mg of potassium  Nuts (walnuts, pecans, or macadamia) - 1 oz has 125 mg of potassium  Oatmeal -  cup has 80 mg of potassium  Okra -  cup has 110 mg of potassium  Onions -  cup has 120 mg of potassium  Peach -  1 has 185 mg of potassium  Peaches (canned) -  cup has 120 mg of potassium  Pears (canned) -  cup has 120 mg of potassium  Peas, green (frozen) -  cup has 90 mg of potassium  Peppers (Green) -  cup has 130 mg of potassium  Peppers (Red) -  cup has 160 mg of potassium  Pineapple juice -  cup has 165 mg of potassium  Pineapple (fresh or canned) -  cup has 100 mg of potassium  Plums - 1 has 105 mg of potassium  Pudding, vanilla -  cup has 150 mg of potassium  Raspberries -  cup has 90 mg of potassium  Rhubarb -  cup has 115 mg of potassium  Rice, wild -  cup has 80 mg of potassium  Shrimp - 3 oz  has 155 mg of potassium  Spinach (raw) - 1 cup has 170 mg of potassium  Strawberries -  cup has 125 mg of potassium  Summer squash -  cup has 175-200 mg of potassium  Swiss chard (raw) - 1 cup has 135 mg of potassium  Tangerines - 1 fruit has 140 mg of potassium  Tea, brewed - 6 oz has 65 mg of potassium  Turnips -  cup has 140 mg of potassium  Watermelon -  cup has 85 mg of potassium  Wine (Red, table) - 5 oz has 180 mg of potassium  Wine (White, table) - 5 oz 100 mg of potassium Low in potassium The following foods and beverages have less than 50 mg of potassium per serving.  Bread (white) - 1 slice has 30 mg of potassium  Carbonated beverages - 12 oz has less than 5 mg of potassium  Cheese - 1 oz has 20-30 mg of potassium  Cranberries -  cup has 45 mg of potassium  Cranberry juice cocktail -  cup has 20 mg of potassium  Fats and oils - 1 Tbsp has less than 5 mg of potassium  Hummus - 1 Tbsp has 32 mg of potassium  Nectar (papaya, mango, or pear) -  cup has 35 mg of potassium  Rice (white or brown) -  cup has 50 mg of potassium  Spaghetti or macaroni (cooked) -  cup has 30 mg of potassium  Tortilla, flour or corn - 1 has 50 mg of potassium  Waffle - 1 four-inch waffle has 50 mg of potassium  Water chestnuts -  cup has 40 mg of  potassium Summary  Potassium is a mineral found in many foods and drinks. It affects how the heart works, and helps keep fluids and minerals balanced in the body.  The amount of potassium you need each day depends on your age and any existing medical conditions you may have. Your health care provider or dietitian may recommend an amount of potassium that you should have each day. This information is not intended to replace advice given to you by your health care provider. Make sure you discuss any questions you have with your health care provider. Document Released: 02/08/2005 Document Revised: 09/21/2016 Document Reviewed: 09/21/2016 Elsevier Interactive Patient Education  2019 [employer] Acute Kidney Injury, Adult  Acute kidney injury is a sudden worsening of kidney function. The kidneys are organs that have several jobs. They filter the blood to remove waste products and extra fluid. They also maintain a healthy balance of minerals and hormones in the body, which helps control blood pressure and keep bones strong. With this condition, your kidneys do not do their jobs as well as they should. This condition ranges from mild to severe. Over time it may develop into long-lasting (chronic) kidney disease. Early detection and treatment may prevent acute kidney injury from developing into a chronic condition. What are the causes? Common causes of this condition include:  A problem with blood flow to the kidneys. This may be caused by: ? Low blood pressure (hypotension) or shock. ? Blood loss. ? Heart and blood vessel (cardiovascular) disease. ? Severe burns. ? Liver disease.  Direct damage to the kidneys. This may be caused by: ? Certain medicines. ? A kidney infection. ? Poisoning. ? Being around or in contact with toxic substances. ? A surgical wound. ? A hard, direct hit to the kidney area.  A sudden blockage of urine flow. This may be  caused by: ? Cancer. ? Kidney stones. ? An  enlarged prostate in males. What are the signs or symptoms? Symptoms of this condition may not be obvious until the condition becomes severe. Symptoms of this condition can include:  Tiredness (lethargy), or difficulty staying awake.  Nausea or vomiting.  Swelling (edema) of the face, legs, ankles, or feet.  Problems with urination, such as: ? Abdominal pain, or pain along the side of your stomach (flank). ? Decreased urine production. ? Decrease in the force of urine flow.  Muscle twitches and cramps, especially in the legs.  Confusion or trouble concentrating.  Loss of appetite.  Fever. How is this diagnosed? This condition may be diagnosed with tests, including:  Blood tests.  Urine tests.  Imaging tests.  A test in which a sample of tissue is removed from the kidneys to be examined under a microscope (kidney biopsy). How is this treated? Treatment for this condition depends on the cause and how severe the condition is. In mild cases, treatment may not be needed. The kidneys may heal on their own. In more severe cases, treatment will involve:  Treating the cause of the kidney injury. This may involve changing any medicines you are taking or adjusting your dosage.  Fluids. You may need specialized IV fluids to balance your body's needs.  Having a catheter placed to drain urine and prevent blockages.  Preventing problems from occurring. This may mean avoiding certain medicines or procedures that can cause further injury to the kidneys. In some cases treatment may also require:  A procedure to remove toxic wastes from the body (dialysis or continuous renal replacement therapy - CRRT).  Surgery. This may be done to repair a torn kidney, or to remove the blockage from the urinary system. Follow these instructions at home: Medicines  Take over-the-counter and prescription medicines only as told by your health care provider.  Do not take any new medicines without your  health care provider's approval. Many medicines can worsen your kidney damage.  Do not take any vitamin and mineral supplements without your health care provider's approval. Many nutritional supplements can worsen your kidney damage. Lifestyle  If your health care provider prescribed changes to your diet, follow them. You may need to decrease the amount of protein you eat.  Achieve and maintain a healthy weight. If you need help with this, ask your health care provider.  Start or continue an exercise plan. Try to exercise at least 30 minutes a day, 5 days a week.  Do not use any tobacco products, such as cigarettes, chewing tobacco, and e-cigarettes. If you need help quitting, ask your health care provider. General instructions  Keep track of your blood pressure. Report changes in your blood pressure as told by your health care provider.  Stay up to date with immunizations. Ask your health care provider which immunizations you need.  Keep all follow-up visits as told by your health care provider. This is important. Where to find more information  American Association of Kidney Patients: BombTimer.gl  National Kidney Foundation: www.kidney.[place]: https://mathis.com/  Life Options Rehabilitation Program: ? www.lifeoptions.org ? www.kidneyschool.org Contact a health care provider if:  Your symptoms get worse.  You develop new symptoms. Get help right away if:  You develop symptoms of worsening kidney disease, which include: ? Headaches. ? Abnormally dark or light skin. ? Easy bruising. ? Frequent hiccups. ? Chest pain. ? Shortness of breath. ? End of menstruation in women. ? Seizures. ?  Confusion or altered mental status. ? Abdominal or back pain. ? Itchiness.  You have a fever.  Your body is producing less urine.  You have pain or bleeding when you urinate. Summary  Acute kidney injury is a sudden worsening of kidney function.  Acute kidney  injury can be caused by problems with blood flow to the kidneys, direct damage to the kidneys, and sudden blockage of urine flow.  Symptoms of this condition may not be obvious until it becomes severe. Symptoms may include edema, lethargy, confusion, nausea or vomiting, and problems passing urine.  This condition can usually be diagnosed with blood tests, urine tests, and imaging tests. Sometimes a kidney biopsy is done to diagnose this condition.  Treatment for this condition often involves treating the underlying cause. It is treated with fluids, medicines, dialysis, diet changes, or surgery. This information is not intended to replace advice given to you by your health care provider. Make sure you discuss any questions you have with your health care provider. Document Released: 01/10/2011 Document Revised: 10/27/2016 Document Reviewed: 06/17/2016 Elsevier Interactive Patient Education  2019 [place].   Hypokalemia Hypokalemia means that the amount of potassium in the blood is lower than normal.Potassium is a chemical that helps regulate the amount of fluid in the body (electrolyte). It also stimulates muscle tightening (contraction) and helps nerves work properly.Normally, most of the bodys potassium is inside of cells, and only a very small amount is in the blood. Because the amount in the blood is so small, minor changes to potassium levels in the blood can be life-threatening. What are the causes? This condition may be caused by:  Antibiotic medicine.  Diarrhea or vomiting. Taking too much of a medicine that helps you have a bowel movement (laxative) can cause diarrhea and lead to hypokalemia.  Chronic kidney disease (CKD).  Medicines that help the body get rid of excess fluid (diuretics).  Eating disorders, such as bulimia.  Low magnesium levels in the body.  Sweating a lot. What are the signs or symptoms? Symptoms of this condition  include:  Weakness.  Constipation.  Fatigue.  Muscle cramps.  Mental confusion.  Skipped heartbeats or irregular heartbeat (palpitations).  Tingling or numbness. How is this diagnosed? This condition is diagnosed with a blood test. How is this treated? Hypokalemia can be treated by taking potassium supplements by mouth or adjusting the medicines that you take. Treatment may also include eating more foods that contain a lot of potassium. If your potassium level is very low, you may need to get potassium through an IV tube in one of your veins and be monitored in the hospital. Follow these instructions at home:   Take over-the-counter and prescription medicines only as told by your health care provider. This includes vitamins and supplements.  Eat a healthy diet. A healthy diet includes fresh fruits and vegetables, whole grains, healthy fats, and lean proteins.  If instructed, eat more foods that contain a lot of potassium, such as: ? Nuts, such as peanuts and pistachios. ? Seeds, such as sunflower seeds and pumpkin seeds. ? Peas, lentils, and lima beans. ? Whole grain and bran cereals and breads. ? Fresh fruits and vegetables, such as apricots, avocado, bananas, cantaloupe, kiwi, oranges, tomatoes, asparagus, and potatoes. ? Orange juice. ? Tomato juice. ? Red meats. ? Yogurt.  Keep all follow-up visits as told by your health care provider. This is important. Contact a health care provider if:  You have weakness that gets worse.  You  feel your heart pounding or racing.  You vomit.  You have diarrhea.  You have diabetes (diabetes mellitus) and you have trouble keeping your blood sugar (glucose) in your target range. Get help right away if:  You have chest pain.  You have shortness of breath.  You have vomiting or diarrhea that lasts for more than 2 days.  You faint. This information is not intended to replace advice given to you by your health care provider.  Make sure you discuss any questions you have with your health care provider. Document Released: 06/27/2005 Document Revised: 02/13/2016 Document Reviewed: 02/13/2016 Elsevier Interactive Patient Education  2019 [employer].

## 2018-12-25 NOTE — Consult Note (Signed)
   [facility] CM Inpatient Consult   12/25/2018  Reginald [name] 10/15/1935 [id number]   Patient screened for high risk score for unplanned readmissions and hospitalizations to check if potential [place] [facility] services are needed.  Chart review was briefed and noted that this patient is a long term resident at [facility] and the disposition is for the patient to return per inpatient TOC LCSW notes.  MD notes reveals patient is admitted for renal failure and HX of COVID -19 positive.  No [facility] Care Management needs noted at this time as patient is a resident at the facility.  Natividad Brood, RN BSN [facility] Liaison  [phone] business mobile phone Toll free office [phone]  Fax number: [phone] [email or web address]

## 2018-12-25 NOTE — Discharge Summary (Addendum)
Discharge Summary  Reginald [name] LEX:[id number] DOB: 25-Feb-1936  PCP: Guadlupe Spanish, MD  Admit date: 12/19/2018 Discharge date: 12/25/2018  Time spent: 35 minutes  Recommendations for Outpatient Follow-up:  1. Follow-up with your primary care provider 2. Follow-up with cardiology 3. Follow-up with urology 4. Take your medications as prescribed 5. Continue physical therapy 6. Fall precautions  Discharge Diagnoses:  Active Hospital Problems   Diagnosis Date Noted  . Acute renal failure superimposed on stage 4 chronic kidney disease ([place]) 12/19/2018  . Acute on chronic renal failure ([place]) 12/19/2018  . Hypokalemia 08/13/2015  . HTN (hypertension) 08/13/2015  . CAD (coronary artery disease), native coronary artery 05/19/2015  . Hyperlipidemia 05/19/2015  . Type 2 diabetes mellitus ([place])   . OSA (obstructive sleep apnea)   . Atrial fibrillation [facility])     Resolved Hospital Problems  No resolved problems to display.    Discharge Condition: Stable  Diet recommendation: Resume previous diet  Vitals:   12/24/18 2200 12/25/18 0803  BP: 123/68 (!) 155/68  Pulse: (!) 57 73  Resp: 20 18  Temp: 98.2 F (36.8 C) 97.6 F (36.4 C)  SpO2: 94% (!) 89%    History of present illness:  The patient is a 83 yr old man who presents to the ED after he was found to have a creatinine of 4.39. He was discharged from this facility on 11/26/2018 after a stay for COVID-19. At discharge his creatinine was 2.21. Prior to that illness it appears that his creatinine was less than 2.0. During his stay he had a modest increase in creatinine due to diuresis. At discharge it was recommended that his torsemide be held for 1 week, and that he have a recheck of his chemistry before restarting. The patient denies any difficulty eating or drinking. He denies difficulty starting a stream of urine. He states that he feels well. No new problems.   He carries a past medical history significant forAtrial  fibrillation, dCHF (EF 10/2016 was 63 - 60%), CKD, COPD, CAD, Hyperlipidemia, OHS, OSA, DM II, and hypertension.  The hospitalist service was consulted to admit the patient for further evaluation and treatment of his AKI. Received gentle IV fluid hydration. During his course was found to have urinary retention so a foley catheter was placed on 12/20/18 and in addition was started on tamsulosin.  Hospital course complicated by ntermittent A. fib with slow ventricular response while holding off his beta-blocker and calcium channel blockers. Cardiology consulted rec to resume toprol xl 25 mg daily and hold for HR< 60 bpm. No further CV workup planned in the hospital.  12/25/18: Patient seen and examined at his bedside.  He has no new concerns.  Vital signs and labs reviewed and are stable.  Contacted urology for follow-up outpatient.  Discussed with Dr. Tresa Moore, we will do a voiding trial prior to DC to SNF.  If unable to void will place him Foley cath, discharge to SNF and will follow-up with Dr. Tresa Moore in 1 to 2 weeks outpatient.  Continue tamsulosin as prescribed.  On the day of discharge, the patient was hemodynamically stable.  He will need to follow-up with his primary care provider, cardiology, and urology posthospitalization. He will also need to continue physical therapy and apply fall precautions.   Hospital Course:  Principal Problem:   Acute renal failure superimposed on stage 4 chronic kidney disease (HCC) Active Problems:   Type 2 diabetes mellitus (HCC)   OSA (obstructive sleep apnea)   Atrial fibrillation (  [place])   CAD (coronary artery disease), native coronary artery   Hyperlipidemia   Hypokalemia   HTN (hypertension)   Acute on chronic renal failure (HCC)  AKI on CKD 3 likely post renal from BPH Baseline creatinine appears to be 1.8 with GFR 33 Presented with creatinine of 4.74 and GFR of 11 Renal ultrasound showed bilateral hydronephrosis + Urinary retention, Foley catheter  placed on 12/20/2018 and started on tamsulosin. Continue to avoid nephrotoxins Net I&O -7.9 L since admission Diuretic has been held due to AKI Resume torsemide at the discretion of your cardiologist Obtain BMP in 1 week to recheck your kidney function, creatinine and GFR. Renal function continues to improve with creatinine on 12/25/2018 of 2.07<<2.27<<2.49<<2.88<<3.67<<4.40. Follow-up with urology, Dr Tresa Moore, in 1 to 2 weeks.  Urinary retention/bilateral hydronephrosis Foley cath placed on 12/20/18, also started on tamsulosin same day. Voiding trial on 12/25/2018 Discussed with Dr. Tresa Moore on 12/25/2018, if unable to void replace Foley cath and discharge to SNF Will need to follow-up with urology Dr. Tresa Moore in 1 to 2 weeks  Permanent A. fib with slow ventricular response Consulted cardiology to further assess on 12/22/2018 and 6/14. Resume home toprol 25mg  daily with hold parameters for HR < 60 bpm per cardiology Continue Eliquis Was previously on Xarelto which was switched to Eliquis due to renal insufficiency Follow-up with cardiology outpatient  Resolved Hypokalemia post repletion K+ 3.5 on 12/25/18 Continue KCl supplement 20 mEq daily Repeat BMP on 01/01/2019 Magnesium 1.9 on 12/22/2018  Resolved Right ankle pain, unclear etiology Denies history of gout  Generalized weakness/physical debility PT assessed and recommended SNF CSW consulted to assist with placement. Plan to return to [facility] when medically stable. Fall precautions.  Recent COVID-19 infection Recently admitted and discharged 11/26/2018 with positive COVID-19 test Independently reviewed chest x-ray from last admission which showed bilateral infiltrates suggestive of COVID-19 infection Repeat COVID-19 test done on 12/19/2018+ Maintain O2 saturation greater than 92% O2 saturation 89% on room air corrected to 94% on 1.5 L nasal cannula Continue albuterol inhaler 3 times daily  Acute hypoxic respiratory failure  likely secondary to COPD versus COVID-19 infection Continue O2 supplementation Maintain O2 saturation greater than 00%  Chronic diastolic CHF Last 2D echo done in 2018 showed LVEF 55 to 60% Torsemide held due to AKI Repeat BMP in 1 week if creatinine is at baseline may consider restarting torsemide 2D echo done on 12/20/2018 showed normal LVEF 6065% with grade 2 diastolic dysfunction  Type 2 diabetes with complication CKD Continue insulin sliding scale Last hemoglobin A1c was 8.3 on 11/16/18  COPD: Appears stable. Management as stated above  CAD: Denies chest pain.  Continue home medications.  Hypertension: Home metoprolol.  Morbid obesity BMI 45 Recommend weight loss outpatient with regular physical activity and healthy dieting Continue PT at SNF   Code Status: Full code    Consultants:  Nephrology, Dr. Augustin Coupe.  Signed off on 12/22/2018.  Cardiology consulted on 12/22/2018, signed off on 12/23/18.  Curb sided with urology Dr. Tresa Moore on 12/25/2018.  Procedures:  Foley cath insertion on 12/20/2018  Antimicrobials:  None  DVT prophylaxis: Eliquis    Discharge Exam: BP (!) 155/68 (BP Location: Left Arm)   Pulse 73   Temp 97.6 F (36.4 C) (Oral)   Resp 18   Ht 5\' 11"  (1.803 m)   Wt (!) 148.8 kg   SpO2 (!) 89%   BMI 45.75 kg/m  . General: 83 y.o. year-old male morbidly obese in no acute distress.  Alert and oriented x3. . Cardiovascular: Irregular rate and rhythm with no rubs or gallops.  No thyromegaly or JVD noted.   Marland Kitchen Respiratory: Clear to auscultation with no wheezes or rales. Good inspiratory effort. . Abdomen: Obese nontender nondistended with bowel sounds present.  . Musculoskeletal: Lower extremity edema present.  Lower extremity wrapped in compression stockings. . Psychiatry: Mood is appropriate for condition and setting  Discharge Instructions You were cared for by a hospitalist during your hospital stay. If you have any questions about  your discharge medications or the care you received while you were in the hospital after you are discharged, you can call the unit and asked to speak with the hospitalist on call if the hospitalist that took care of you is not available. Once you are discharged, your primary care physician will handle any further medical issues. Please note that NO REFILLS for any discharge medications will be authorized once you are discharged, as it is imperative that you return to your primary care physician (or establish a relationship with a primary care physician if you do not have one) for your aftercare needs so that they can reassess your need for medications and monitor your lab values.   Allergies as of 12/25/2018      Reactions   Adhesive [tape] Rash      Medication List    STOP taking these medications   amLODipine 10 MG tablet Commonly known as: NORVASC   metoprolol tartrate 25 MG tablet Commonly known as: LOPRESSOR   Xarelto 15 MG Tabs tablet Generic drug: Rivaroxaban     TAKE these medications   acetaminophen 325 MG tablet Commonly known as: TYLENOL Take 650 mg by mouth every 4 (four) hours as needed for mild pain.   apixaban 2.5 MG Tabs tablet Commonly known as: ELIQUIS Take 1 tablet (2.5 mg total) by mouth 2 (two) times daily.   atorvastatin 20 MG tablet Commonly known as: LIPITOR Take 20 mg by mouth at bedtime.   cetirizine 10 MG tablet Commonly known as: ZYRTEC Take 10 mg by mouth at bedtime.   docusate sodium 100 MG capsule Commonly known as: COLACE Take 100 mg by mouth at bedtime.   escitalopram 5 MG tablet Commonly known as: LEXAPRO Take 5 mg by mouth daily.   eucerin cream Apply 1 application topically 2 (two) times a day.   gabapentin 100 MG capsule Commonly known as: NEURONTIN Take 100 mg by mouth 3 (three) times daily.   latanoprost 0.005 % ophthalmic solution Commonly known as: XALATAN Place 1 drop into both eyes at bedtime.   Magnesium 400 MG Tabs  Take 400 mg by mouth daily.   Melatonin 5 MG Tabs Take 5 mg by mouth at bedtime.   Menthol (Topical Analgesic) 4.5 % Gel Apply 1 application topically 2 (two) times daily as needed (pain). Apply to forehead and left arm   metoprolol succinate 25 MG 24 hr tablet Commonly known as: TOPROL-XL Take 25 mg by mouth daily.   multivitamin with minerals Tabs tablet Take 1 tablet by mouth every evening.   nitroGLYCERIN 0.4 MG SL tablet Commonly known as: NITROSTAT Place 0.4 mg under the tongue every 5 (five) minutes as needed for chest pain.   phenylephrine-shark liver oil-mineral oil-petrolatum 0.25-3-14-71.9 % rectal ointment Commonly known as: PREPARATION H Place 1 application rectally 3 (three) times daily.   Potassium Chloride ER 20 MEQ Tbcr Take 20 mEq by mouth daily.   tamsulosin 0.4 MG Caps capsule Commonly known as: FLOMAX  Take 1 capsule (0.4 mg total) by mouth daily after supper.   torsemide 20 MG tablet Commonly known as: DEMADEX Take 1 tablet (20 mg total) by mouth daily. Restart on 01/01/2019 after renal function check with BMP and if ok by your PCP/cardiologist. What changed:   how much to take  additional instructions   Trulicity 5.62 ZH/0.8MV Sopn Generic drug: Dulaglutide Inject 0.5 mLs into the skin every Monday.   vitamin B-12 1000 MCG tablet Commonly known as: CYANOCOBALAMIN Take 1,000 mcg by mouth daily.      Allergies  Allergen Reactions  . Adhesive [Tape] Rash   Follow-up Information    Guadlupe Spanish, MD. Call in 1 day(s).   Specialty: Internal Medicine Why: Please call for a post hospital follow-up appointment. Contact information: [place] [address] [phone]        Alexis Frock, MD. Call in 1 day(s).   Specialty: Urology Why: Please call for a post hospital follow-up appointment.  You will need to follow-up with Dr. Tresa Moore in 1 to 2 weeks. Contact information: [place] [address]  [phone]        Thompson Grayer, MD. Call in 1 day(s).   Specialty: Cardiology Why: Please call for a post hospital follow-up appointment. Contact information: [address] [place] [address] [phone]            The results of significant diagnostics from this hospitalization (including imaging, microbiology, ancillary and laboratory) are listed below for reference.    Significant Diagnostic Studies: US Renal  Result Date: 12/19/2018 CLINICAL DATA:  Acute renal insufficiency. EXAM: RENAL / URINARY TRACT ULTRASOUND COMPLETE COMPARISON:  CT scan Nov 08, 2017 FINDINGS: Right Kidney: Renal measurements: 10.1 x 4.8 by 5.8 cm = volume: 148 mL. Moderate hydronephrosis worsened since previous imaging. Left Kidney: Renal measurements: 13 x 6.3 x 6.7 cm = volume: 286 mL. Moderate hydronephrosis, worsened since previous imaging. Bladder: Appears normal for degree of bladder distention. IMPRESSION: Bilateral moderate hydronephrosis is worsened compared to previous studies. The bilateral hydronephrosis is otherwise age indeterminate. Ongoing obstruction not excluded on this study. Recommend clinical correlation. Electronically Signed   By: Dorise Bullion III M.D   On: 12/19/2018 18:01   Dg Chest Port 1 View  Result Date: 12/20/2018 CLINICAL DATA:  COVID-19 positivity EXAM: PORTABLE CHEST 1 VIEW COMPARISON:  11/16/2018 FINDINGS: Cardiac shadow is within normal limits. Previously seen patchy infiltrates have resolved when compare with the prior exam. No other focal abnormality is noted. IMPRESSION: Resolution of previously seen infiltrates. Electronically Signed   By: Inez Catalina M.D.   On: 12/20/2018 11:06    Microbiology: Recent Results (from the past 240 hour(s))  SARS Coronavirus 2     Status: Abnormal   Collection Time: 12/19/18  6:18 PM  Result Value Ref Range Status   SARS Coronavirus 2 DETECTED (A) NOT DETECTED Final    Comment: RESULT CALLED TO, READ BACK BY  AND VERIFIED WITH: Lendell Caprice RN 2008 12/19/18 A BROWNING (NOTE) SARS-CoV-2 target nucleic acids are DETECTED. The SARS-CoV-2 RNA is generally detectable in upper and lower respiratory specimens during the acute phase of infection. Positive results are indicative of active infection with SARS-CoV-2. Clinical  correlation with patient history and other diagnostic information is necessary to determine patient infection status. Positive results do  not rule out bacterial infection or co-infection with other viruses. The expected result is Not Detected. Fact Sheet for Patients: http://www.biofiredefense.com/wp-content/uploads/2020/03/BIOFIRE-COVID -19-patients.pdf Fact  Sheet for Healthcare Providers: http://www.biofiredefense.com/wp-content/uploads/2020/03/BIOFIRE-COVID -19-hcp.pdf This test is not yet approved or cleared by the [place] and  has been authorized for detection and/or diagnosis of SARS-CoV-2 by FDA under an Emergency  Use Authorization (EUA).  This EUA will remain in effect (meaning this test can be used) for the duration of  the COVID-19 declaration under Section 564(b)(1) of the Act, 21 U.S.C. section [phone] 3(b)(1), unless the authorization is terminated or revoked sooner. Performed at [facility], [place] [address] [place], [place] [address]      Labs: Basic Metabolic Panel: Recent Labs  Lab 12/21/18 0847 12/22/18 0757 12/23/18 0631 12/24/18 0645 12/25/18 0627  NA 142 141 142 141 141  K 2.9* 2.9* 2.9* 3.5 3.5  CL 100 102 101 104 105  CO2 31 27 31 27 29   GLUCOSE 158* 148* 128* 148* 127*  BUN 81* 64* 51* 42* 35*  CREATININE 3.67* 2.88* 2.49* 2.27* 2.07*  CALCIUM 9.6 9.8 10.0 9.9 9.9  MG 2.0 1.9  --   --   --   PHOS 5.3*  --   --   --   --    Liver Function Tests: Recent Labs  Lab 12/20/18 0414 12/21/18 0847  AST 28  --   ALT 29  --   ALKPHOS 49  --   BILITOT 0.5  --   PROT 5.4*  --   ALBUMIN 3.2* 2.7*   No results for input(s):  LIPASE, AMYLASE in the last 168 hours. No results for input(s): AMMONIA in the last 168 hours. CBC: Recent Labs  Lab 12/19/18 1221 12/20/18 0414  WBC 5.1 4.0  HGB 13.2 12.4*  HCT 42.0 38.9*  MCV 89.2 88.0  PLT 215 207   Cardiac Enzymes: No results for input(s): CKTOTAL, CKMB, CKMBINDEX, TROPONINI in the last 168 hours. BNP: BNP (last 3 results) No results for input(s): BNP in the last 8760 hours.  ProBNP (last 3 results) No results for input(s): PROBNP in the last 8760 hours.  CBG: Recent Labs  Lab 12/24/18 0720 12/24/18 1223 12/24/18 1627 12/24/18 2017 12/25/18 0757  GLUCAP 140* 198* 164* 181* 135*       Signed:  Kayleen Memos, MD [facility] 12/25/2018, 11:03 AM

## 2018-12-25 NOTE — Progress Notes (Signed)
Report given to the nurse receiving patient at [facility]

## 2018-12-25 NOTE — TOC Transition Note (Signed)
Transition of Care [facility]) - CM/SW Discharge Note   Patient Details  Name: Reginald [name] MRN: [id number] Date of Birth: 1935-11-22  Transition of Care [facility]) CM/SW Contact:  Amador Cunas, [place] Phone Number: 12/25/2018, 2:39 PM   Clinical Narrative:   Pt for dc back to [facility] today. Notified pt's son, [name] [phone] of dc and he reports agreeable. Spoke to [facility] at [facility] who confirmed they are prepared to accept pt to room 704-P today. RN to call report [phone]. [facility] arranged for transport.     Final next level of care: Skilled Nursing Facility Barriers to Discharge: No Barriers Identified   Patient Goals and CMS Choice   CMS Medicare.gov Compare Post Acute Care list provided to:: Patient Represenative (must comment)(Marcus (son)) Choice offered to / list presented to : NA  Discharge Placement              Patient chooses bed at: [facility] Patient to be transferred to facility by: Non-emergency Ambulance Name of family member notified: Charlann Noss Patient and family notified of of transfer: 12/25/18  Discharge Plan and Services                                     Social Determinants of Health (SDOH) Interventions     Readmission Risk Interventions No flowsheet data found.

## 2018-12-27 DIAGNOSIS — J9601 Acute respiratory failure with hypoxia: Secondary | ICD-10-CM | POA: Diagnosis not present

## 2018-12-27 DIAGNOSIS — R339 Retention of urine, unspecified: Secondary | ICD-10-CM | POA: Diagnosis not present

## 2018-12-27 DIAGNOSIS — N179 Acute kidney failure, unspecified: Secondary | ICD-10-CM | POA: Diagnosis not present

## 2018-12-27 DIAGNOSIS — N1330 Unspecified hydronephrosis: Secondary | ICD-10-CM | POA: Diagnosis not present

## 2019-01-04 DIAGNOSIS — I5031 Acute diastolic (congestive) heart failure: Secondary | ICD-10-CM | POA: Diagnosis not present

## 2019-01-04 DIAGNOSIS — I130 Hypertensive heart and chronic kidney disease with heart failure and stage 1 through stage 4 chronic kidney disease, or unspecified chronic kidney disease: Secondary | ICD-10-CM | POA: Diagnosis not present

## 2019-01-04 DIAGNOSIS — R338 Other retention of urine: Secondary | ICD-10-CM | POA: Diagnosis not present

## 2019-01-04 DIAGNOSIS — R600 Localized edema: Secondary | ICD-10-CM | POA: Diagnosis not present

## 2019-01-09 DIAGNOSIS — R634 Abnormal weight loss: Secondary | ICD-10-CM | POA: Diagnosis not present

## 2019-01-09 DIAGNOSIS — N178 Other acute kidney failure: Secondary | ICD-10-CM | POA: Diagnosis not present

## 2019-01-09 DIAGNOSIS — R32 Unspecified urinary incontinence: Secondary | ICD-10-CM | POA: Diagnosis not present

## 2019-01-09 DIAGNOSIS — E876 Hypokalemia: Secondary | ICD-10-CM | POA: Diagnosis not present

## 2019-01-09 DIAGNOSIS — E119 Type 2 diabetes mellitus without complications: Secondary | ICD-10-CM | POA: Diagnosis not present

## 2019-01-09 DIAGNOSIS — R600 Localized edema: Secondary | ICD-10-CM | POA: Diagnosis not present

## 2019-01-09 DIAGNOSIS — R338 Other retention of urine: Secondary | ICD-10-CM | POA: Diagnosis not present

## 2019-01-22 DIAGNOSIS — R600 Localized edema: Secondary | ICD-10-CM | POA: Diagnosis not present

## 2019-01-22 DIAGNOSIS — E568 Deficiency of other vitamins: Secondary | ICD-10-CM | POA: Diagnosis not present

## 2019-01-22 DIAGNOSIS — E7849 Other hyperlipidemia: Secondary | ICD-10-CM | POA: Diagnosis not present

## 2019-01-22 DIAGNOSIS — I10 Essential (primary) hypertension: Secondary | ICD-10-CM | POA: Diagnosis not present

## 2019-01-22 DIAGNOSIS — N184 Chronic kidney disease, stage 4 (severe): Secondary | ICD-10-CM | POA: Diagnosis not present

## 2019-01-22 DIAGNOSIS — I480 Paroxysmal atrial fibrillation: Secondary | ICD-10-CM | POA: Diagnosis not present

## 2019-01-22 DIAGNOSIS — E119 Type 2 diabetes mellitus without complications: Secondary | ICD-10-CM | POA: Diagnosis not present

## 2019-02-06 ENCOUNTER — Other Ambulatory Visit: Payer: Self-pay

## 2019-02-11 DIAGNOSIS — I5032 Chronic diastolic (congestive) heart failure: Secondary | ICD-10-CM | POA: Diagnosis not present

## 2019-02-11 DIAGNOSIS — R600 Localized edema: Secondary | ICD-10-CM | POA: Diagnosis not present

## 2019-02-12 DIAGNOSIS — I10 Essential (primary) hypertension: Secondary | ICD-10-CM | POA: Diagnosis not present

## 2019-02-12 DIAGNOSIS — W1789XA Other fall from one level to another, initial encounter: Secondary | ICD-10-CM | POA: Diagnosis not present

## 2019-02-14 DIAGNOSIS — I5031 Acute diastolic (congestive) heart failure: Secondary | ICD-10-CM | POA: Diagnosis not present

## 2019-02-14 DIAGNOSIS — I480 Paroxysmal atrial fibrillation: Secondary | ICD-10-CM | POA: Diagnosis not present

## 2019-02-14 DIAGNOSIS — I129 Hypertensive chronic kidney disease with stage 1 through stage 4 chronic kidney disease, or unspecified chronic kidney disease: Secondary | ICD-10-CM | POA: Diagnosis not present

## 2019-02-14 DIAGNOSIS — I2510 Atherosclerotic heart disease of native coronary artery without angina pectoris: Secondary | ICD-10-CM | POA: Diagnosis not present

## 2019-03-11 DIAGNOSIS — H2513 Age-related nuclear cataract, bilateral: Secondary | ICD-10-CM | POA: Diagnosis not present

## 2019-03-11 DIAGNOSIS — H43811 Vitreous degeneration, right eye: Secondary | ICD-10-CM | POA: Diagnosis not present

## 2019-03-11 DIAGNOSIS — H25013 Cortical age-related cataract, bilateral: Secondary | ICD-10-CM | POA: Diagnosis not present

## 2019-03-11 DIAGNOSIS — E113393 Type 2 diabetes mellitus with moderate nonproliferative diabetic retinopathy without macular edema, bilateral: Secondary | ICD-10-CM | POA: Diagnosis not present

## 2019-03-11 DIAGNOSIS — H348111 Central retinal vein occlusion, right eye, with retinal neovascularization: Secondary | ICD-10-CM | POA: Diagnosis not present

## 2019-03-11 DIAGNOSIS — H401234 Low-tension glaucoma, bilateral, indeterminate stage: Secondary | ICD-10-CM | POA: Diagnosis not present

## 2019-03-11 DIAGNOSIS — H524 Presbyopia: Secondary | ICD-10-CM | POA: Diagnosis not present

## 2019-03-11 DIAGNOSIS — H25042 Posterior subcapsular polar age-related cataract, left eye: Secondary | ICD-10-CM | POA: Diagnosis not present

## 2019-03-13 DIAGNOSIS — I10 Essential (primary) hypertension: Secondary | ICD-10-CM | POA: Diagnosis not present

## 2019-03-13 DIAGNOSIS — W19XXXA Unspecified fall, initial encounter: Secondary | ICD-10-CM | POA: Diagnosis not present

## 2019-03-13 DIAGNOSIS — R5381 Other malaise: Secondary | ICD-10-CM | POA: Diagnosis not present

## 2019-03-13 DIAGNOSIS — E119 Type 2 diabetes mellitus without complications: Secondary | ICD-10-CM | POA: Diagnosis not present

## 2019-03-21 DIAGNOSIS — J984 Other disorders of lung: Secondary | ICD-10-CM | POA: Diagnosis not present

## 2019-03-21 DIAGNOSIS — N184 Chronic kidney disease, stage 4 (severe): Secondary | ICD-10-CM | POA: Diagnosis not present

## 2019-03-21 DIAGNOSIS — I480 Paroxysmal atrial fibrillation: Secondary | ICD-10-CM | POA: Diagnosis not present

## 2019-03-21 DIAGNOSIS — E119 Type 2 diabetes mellitus without complications: Secondary | ICD-10-CM | POA: Diagnosis not present

## 2019-03-21 DIAGNOSIS — E538 Deficiency of other specified B group vitamins: Secondary | ICD-10-CM | POA: Diagnosis not present

## 2019-03-21 DIAGNOSIS — E7849 Other hyperlipidemia: Secondary | ICD-10-CM | POA: Diagnosis not present

## 2019-03-21 DIAGNOSIS — I5032 Chronic diastolic (congestive) heart failure: Secondary | ICD-10-CM | POA: Diagnosis not present

## 2019-03-21 DIAGNOSIS — I10 Essential (primary) hypertension: Secondary | ICD-10-CM | POA: Diagnosis not present

## 2019-03-25 NOTE — Progress Notes (Addendum)
[place] Note  03/26/2019     CHIEF COMPLAINT Patient presents for Retina Evaluation   HISTORY OF PRESENT ILLNESS: Reginald [name] is a 83 y.o. male who presents to the clinic today for:   HPI    Retina Evaluation    In right eye.  Onset: Unknown.  Duration: Unknown.  Associated Symptoms Floaters.  Context:  distance vision, mid-range vision and near vision.  Treatments tried include no treatments.  I, the attending physician,  performed the HPI with the patient and updated documentation appropriately.          Comments    83 y/o male pt referred by Dr. Parke Simmers for eval of HH plaque OD.  Has also been seen by a retinal specialist at [facility], but does not recall when he last saw either doctor.  Feels VA is good OU.  Denies pain, flashes, but has an occasional small floater OD.  BS 185 yesterday.  A1C 6.7 2 mos ago.  Latanoprost QHS OU.       Last edited by Bernarda Caffey, MD on 03/26/2019 11:36 PM. (History)    pt went to see Dr. Parke Simmers for routine eye exam to get new glasses, pt states he was seen at [facility] and saw Dr. Milus Height, but states that since his eyes weren't bothering him so she wasn't going to give him injections or laser, pt states he also saw a dr there for glaucoma, he states he uses latanoprost every day in both eyes  Referring physician: Demarco, [place], [address] [place],  [place] [address]  HISTORICAL INFORMATION:   Selected notes from the [place] Referred by Dr. [place] DeMarco for concern of hollenhorst plaque OD LEE: 09.01.20 (J. DeMarco) [BCVA: OD: OS:] Ocular Hx-NPDR OU, PVD OD, CRVO with NV OD, PSC OS, cataracts OU, glaucoma OU PMH-CHF, CKD, COPD, HLD, HTN   CURRENT MEDICATIONS: Current Outpatient Medications (Ophthalmic Drugs)  Medication Sig  . latanoprost (XALATAN) 0.005 % ophthalmic solution Place 1 drop into both eyes at bedtime.   No current  facility-administered medications for this visit.  (Ophthalmic Drugs)   Current Outpatient Medications (Other)  Medication Sig  . acetaminophen (TYLENOL) 325 MG tablet Take 650 mg by mouth every 4 (four) hours as needed for mild pain.  Marland Kitchen apixaban (ELIQUIS) 2.5 MG TABS tablet Take 1 tablet (2.5 mg total) by mouth 2 (two) times daily.  Marland Kitchen atorvastatin (LIPITOR) 20 MG tablet Take 20 mg by mouth at bedtime.   . cetirizine (ZYRTEC) 10 MG tablet Take 10 mg by mouth at bedtime.  . docusate sodium (COLACE) 100 MG capsule Take 100 mg by mouth at bedtime.  . Dulaglutide (TRULICITY) 7.40 CX/4.4YJ SOPN Inject 0.5 mLs into the skin every Monday.  . escitalopram (LEXAPRO) 5 MG tablet Take 5 mg by mouth daily.  Marland Kitchen gabapentin (NEURONTIN) 100 MG capsule Take 100 mg by mouth 3 (three) times daily.  Marland Kitchen glipiZIDE (GLUCOTROL) 10 MG tablet   . Magnesium 400 MG TABS Take 400 mg by mouth daily.  . Melatonin 5 MG TABS Take 5 mg by mouth at bedtime.  . Menthol, Topical Analgesic, 4.5 % GEL Apply 1 application topically 2 (two) times daily as needed (pain). Apply to forehead and left arm  . metolazone (ZAROXOLYN) 5 MG tablet   . metoprolol succinate (TOPROL-XL) 25 MG 24 hr tablet Take 25 mg by mouth daily.  . Multiple Vitamin (MULTIVITAMIN WITH MINERALS) TABS tablet  Take 1 tablet by mouth every evening.  . nitroGLYCERIN (NITROSTAT) 0.4 MG SL tablet Place 0.4 mg under the tongue every 5 (five) minutes as needed for chest pain.  . phenylephrine-shark liver oil-mineral oil-petrolatum (PREPARATION H) 0.25-3-14-71.9 % rectal ointment Place 1 application rectally 3 (three) times daily.  . Potassium Chloride ER 20 MEQ TBCR Take 20 mEq by mouth daily.  . Skin Protectants, Misc. (EUCERIN) cream Apply 1 application topically 2 (two) times a day.  . tamsulosin (FLOMAX) 0.4 MG CAPS capsule Take 1 capsule (0.4 mg total) by mouth daily after supper.  . torsemide (DEMADEX) 20 MG tablet Take 1 tablet (20 mg total) by mouth daily. Restart  on 01/01/2019 after renal function check with BMP and if ok by your PCP/cardiologist.  . vitamin B-12 (CYANOCOBALAMIN) 1000 MCG tablet Take 1,000 mcg by mouth daily.   No current facility-administered medications for this visit.  (Other)      REVIEW OF SYSTEMS: ROS    Positive for: Genitourinary, Cardiovascular, Eyes, Respiratory   Negative for: Constitutional, Gastrointestinal, Neurological, Skin, Musculoskeletal, HENT, Endocrine, Psychiatric, Allergic/Imm, Heme/Lymph   Last edited by Matthew Folks, COA on 03/26/2019  2:01 PM. (History)       ALLERGIES Allergies  Allergen Reactions  . Adhesive [Tape] Rash    PAST MEDICAL HISTORY Past Medical History:  Diagnosis Date  . Anteroseptal myocardial infarction ([place])   . CHF (congestive heart failure) ([place])   . Chronic kidney disease   . COPD (chronic obstructive pulmonary disease) ([place])   . Coronary atherosclerosis of unspecified type of vessel, native or graft    a. s/p prior PCI to LAD in 1997  . Diverticulosis   . Glaucoma   . Hypercholesteremia   . Hypertensive renal disease   . Hypokalemia   . Obesity hypoventilation syndrome ([place])   . Obesity, unspecified   . OSA (obstructive sleep apnea)   . Permanent atrial fibrillation   . Rhabdomyolysis   . Type II or unspecified type diabetes mellitus without mention of complication, not stated as uncontrolled   . Unspecified essential hypertension   . Vestibulopathy    Past Surgical History:  Procedure Laterality Date  . APPENDECTOMY  1985  . CARDIAC CATHETERIZATION    . CHOLECYSTECTOMY  06/2000  . CORONARY ANGIOPLASTY  08/14/1995   stent placement to LAD   . ENDOVENOUS ABLATION SAPHENOUS VEIN W/ LASER Left 02/21/2018   endovenous laser ablation L GSV by Ruta Hinds MD   . [place] Right 1985  . KNEE ARTHROSCOPY Left 1991  . LUMBAR FUSION    . NOSE SURGERY  1970s   Per Dr. Terrence Dupont PSH in pt chart    FAMILY HISTORY Family History  Problem Relation  Age of Onset  . COPD Mother   . Heart disease Mother   . Diabetes Father   . Heart disease Father   . Diabetes Sister   . Heart disease Brother   . Heart disease Brother   . Breast cancer Maternal Aunt   . Diabetes Paternal Grandmother   . Colon cancer Maternal Aunt   . Ovarian cancer Daughter   . Glaucoma Other     SOCIAL HISTORY Social History   Tobacco Use  . Smoking status: Never Smoker  . Smokeless tobacco: Never Used  Substance Use Topics  . Alcohol use: No    Alcohol/week: 0.0 standard drinks  . Drug use: No         OPHTHALMIC EXAM:  Base Eye Exam  Visual Acuity (Snellen - Linear)      Right Left   Dist cc 20/40 -2 20/60 -2   Dist ph cc NI 20/50 -2   Correction: Glasses       Tonometry (Tonopen, 2:04 PM)      Right Left   Pressure 17 17       Pupils      Dark Light Shape React APD   Right 3 2 Round Brisk None   Left 3 2 Round Brisk None       Visual Fields (Counting fingers)      Left Right    Full Full       Extraocular Movement      Right Left    Full, Ortho Full, Ortho       Neuro/Psych    Oriented x3: Yes   Mood/Affect: Normal       Dilation    Both eyes: 1.0% Mydriacyl, 2.5% Phenylephrine @ 2:04 PM        Slit Lamp and Fundus Exam    Slit Lamp Exam      Right Left   Lids/Lashes Dermatochalasis - upper lid, Meibomian gland dysfunction Dermatochalasis - upper lid, Meibomian gland dysfunction   Conjunctiva/Sclera White and quiet White and quiet   Cornea Arcus, trace Punctate epithelial erosions Arcus, 1+ inferior Punctate epithelial erosions   Anterior Chamber Deep and quiet, narrow temporal angle Deep and quiet, narrow temporal angle   Iris Round and dilated, No NVI Round and dilated, No NVI   Lens 2-3+ Nuclear sclerosis, 2-3+ Cortical cataract 2-3+ Nuclear sclerosis, 2-3+ Cortical cataract   Vitreous mild Vitreous syneresis, Posterior vitreous detachment mild Vitreous syneresis       Fundus Exam      Right Left   Disc  nasal flame hemes, blurred margin Pink and Sharp   C/D Ratio 0.5 0.5   Macula Flat, Blunted foveal reflex, scattered IRH Flat, Blunted foveal reflex, Retinal pigment epithelial mottling   Vessels Vascular attenuation, dilated and tortuous venules, mild macroaneursym along superior macular arteriole; remote superior BRVO Vascular attenuation, Tortuous, AV crossing changes   Periphery Attached, scattered DBH superiorly  Attached        Refraction    Wearing Rx      Sphere Cylinder Axis Add   Right -0.50 +2.75 178 +2.75   Left -1.00 +3.00 007 +2.75   Age: 42yrs   Type: Bifocal       Manifest Refraction      Sphere Cylinder Axis Dist VA   Right -0.25 +2.75 180 20/40   Left -1.50 +3.00 010 20/50          IMAGING AND PROCEDURES  Imaging and Procedures for @TODAY @  OCT, Retina - OU - Both Eyes       Right Eye Quality was good. Central Foveal Thickness: 250. Progression has no prior data. Findings include normal foveal contour, no IRF, no SRF (Mildly irregular lamination; trace ERM).   Left Eye Quality was good. Central Foveal Thickness: 240. Progression has no prior data. Findings include normal foveal contour, no IRF, no SRF.   Notes *Images captured and stored on drive  Diagnosis / Impression:  NFP, no IRF/SRF OU OD: Mildly irregular lamination; trace ERM  Clinical management:  See below  Abbreviations: NFP - Normal foveal profile. CME - cystoid macular edema. PED - pigment epithelial detachment. IRF - intraretinal fluid. SRF - subretinal fluid. EZ - ellipsoid zone. ERM - epiretinal membrane. ORA - outer retinal atrophy.  ORT - outer retinal tubulation. SRHM - subretinal hyper-reflective material        Fluorescein Angiography Optos (Transit OD)       Right Eye   Progression has no prior data. Early phase findings include delayed filling, microaneurysm, vascular perfusion defect (Delayed filling of superior venous system; telangiectatic vessels superior macula).  Mid/Late phase findings include microaneurysm, vascular perfusion defect, leakage (Perivascular leakage superior periphery, greatest temporally).   Left Eye   Progression has no prior data. Early phase findings include microaneurysm, staining. Mid/Late phase findings include microaneurysm, staining.   Notes Images stored on drive;   Impression: OD: remote BRVO; focal macroaneurysm superior macular arteriole OS: peripheral staining otherwise normal study                  ASSESSMENT/PLAN:    ICD-10-CM   1. 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 Retinal macroaneurysm, right eye  H35.011   3. 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 Retinal edema  H35.81 OCT, Retina - OU - Both Eyes  5. Essential hypertension  I10   6. Hypertensive retinopathy of both eyes  H35.033 Fluorescein Angiography Optos (Transit OD)  7. Combined forms of age-related cataract of both eyes  H25.813     1,2. 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 Milus Height at [facility] -- will try to obtain records   - The natural history of retinal vein occlusion and macular edema and treatment options including observation, laser photocoagulation, and intravitreal antiVEGF injection with Avastin and Lucentis and Eylea and intravitreal injection of steroids with triamcinolone and Ozurdex and the complications of these procedures including loss of vision, infection, cataract, glaucoma, and retinal detachment were discussed with patient.  - Specifically discussed findings from [place] / [place] study regarding patient stabilization with anti-VEGF agents and increased potential for visual improvements.  Also discussed need for frequent follow up and potentially multiple injections given the chronic nature of the disease process  -  BCVA: OD: 20/40  - OCT shows mildly irregular lamination but no CME or IRF  - FA shows superior retinal arterial macroaeurysm, telangectatic vessels in superior macular and capillary drop out temporal periphery with late perivascular leakage just proximal to temporal peripheral patch of vascular nonperfusion  - recommend segmental PRP to areas of capillary drop out OD  - F/U 2-3 weeks -- DFE/OCT/segmentalPRP OD  3. Moderate nonproliferative diabetic retinopathy w/o DME, OU  - The incidence, risk factors for progression, natural history and treatment options for diabetic retinopathy were discussed with patient.    - The need for close monitoring of blood glucose, blood pressure, and serum lipids, avoiding cigarette or any type of tobacco, and the need for long term follow up was also discussed with patient.  - exam shows scattered MA and IRH OU  - FA (09.15.20) shows scattered MA OU  - OCT without diabetic macular edema, OU  - monitor  4. No retinal edema on exam or OCT  5,6. Hypertensive retinopathy OU  - discussed importance of tight BP control  - monitor  7. Mixed form age related cataract OU  - The symptoms of cataract, surgical options, and treatments and risks were discussed with patient.  - discussed diagnosis and progression  - not yet visually significant  - monitor for now    Ophthalmic  Meds Ordered this visit:  No orders of the defined types were placed in this encounter.      Return in about 3 weeks (around 04/16/2019) for Dilated Exam, OCT, Laser.  There are no Patient Instructions on file for this visit.   Explained the diagnoses, plan, and follow up with the patient and they expressed understanding.  Patient expressed understanding of the importance of proper follow up care.   This document serves as a record of services personally performed by Gardiner Sleeper, MD, PhD. It was created on their behalf by Ernest Mallick, OA, an ophthalmic assistant. The creation of  this record is the provider's dictation and/or activities during the visit.    Electronically signed by: Ernest Mallick, OA 09.14.2020 11:58 PM    Gardiner Sleeper, M.D., Ph.D. Diseases & Surgery of the Retina and Vitreous [facility]  I have reviewed the above documentation for accuracy and completeness, and I agree with the above. Gardiner Sleeper, M.D., Ph.D. 03/26/19 11:58 PM   Abbreviations: M myopia (nearsighted); A astigmatism; H hyperopia (farsighted); P presbyopia; Mrx spectacle prescription;  CTL contact lenses; OD right eye; OS left eye; OU both eyes  XT exotropia; ET esotropia; PEK punctate epithelial keratitis; PEE punctate epithelial erosions; DES dry eye syndrome; MGD meibomian gland dysfunction; ATs artificial tears; PFAT's preservative free artificial tears; [place] nuclear sclerotic cataract; PSC posterior subcapsular cataract; ERM epi-retinal membrane; PVD posterior vitreous detachment; RD retinal detachment; DM diabetes mellitus; DR diabetic retinopathy; NPDR non-proliferative diabetic retinopathy; PDR proliferative diabetic retinopathy; CSME clinically significant macular edema; DME diabetic macular edema; dbh dot blot hemorrhages; CWS cotton wool spot; POAG primary open angle glaucoma; C/D cup-to-disc ratio; HVF humphrey visual field; GVF goldmann visual field; OCT optical coherence tomography; IOP intraocular pressure; BRVO Branch retinal vein occlusion; CRVO central retinal vein occlusion; CRAO central retinal artery occlusion; BRAO branch retinal artery occlusion; RT retinal tear; SB scleral buckle; PPV pars plana vitrectomy; VH Vitreous hemorrhage; PRP panretinal laser photocoagulation; IVK intravitreal kenalog; VMT vitreomacular traction; MH Macular hole;  NVD neovascularization of the disc; NVE neovascularization elsewhere; AREDS age related eye disease study; ARMD age related macular degeneration; POAG primary open angle glaucoma; EBMD epithelial/anterior  basement membrane dystrophy; ACIOL anterior chamber intraocular lens; IOL intraocular lens; PCIOL posterior chamber intraocular lens; Phaco/IOL phacoemulsification with intraocular lens placement; [place] photorefractive keratectomy; LASIK laser assisted in situ keratomileusis; HTN hypertension; DM diabetes mellitus; COPD chronic obstructive pulmonary disease

## 2019-03-26 ENCOUNTER — Other Ambulatory Visit: Payer: Self-pay

## 2019-03-26 ENCOUNTER — Encounter (INDEPENDENT_AMBULATORY_CARE_PROVIDER_SITE_OTHER): Payer: Self-pay | Admitting: Ophthalmology

## 2019-03-26 ENCOUNTER — Ambulatory Visit (INDEPENDENT_AMBULATORY_CARE_PROVIDER_SITE_OTHER): Payer: Medicare Other | Admitting: Ophthalmology

## 2019-03-26 DIAGNOSIS — I10 Essential (primary) hypertension: Secondary | ICD-10-CM

## 2019-03-26 DIAGNOSIS — H35033 Hypertensive retinopathy, bilateral: Secondary | ICD-10-CM

## 2019-03-26 DIAGNOSIS — H25813 Combined forms of age-related cataract, bilateral: Secondary | ICD-10-CM

## 2019-03-26 DIAGNOSIS — H35011 Changes in retinal vascular appearance, right eye: Secondary | ICD-10-CM

## 2019-03-26 DIAGNOSIS — H3581 Retinal edema: Secondary | ICD-10-CM

## 2019-03-26 DIAGNOSIS — H348312 Tributary (branch) retinal vein occlusion, right eye, stable: Secondary | ICD-10-CM | POA: Diagnosis not present

## 2019-03-26 DIAGNOSIS — E113393 Type 2 diabetes mellitus with moderate nonproliferative diabetic retinopathy without macular edema, bilateral: Secondary | ICD-10-CM | POA: Diagnosis not present

## 2019-03-28 DIAGNOSIS — I5031 Acute diastolic (congestive) heart failure: Secondary | ICD-10-CM | POA: Diagnosis not present

## 2019-03-28 DIAGNOSIS — J984 Other disorders of lung: Secondary | ICD-10-CM | POA: Diagnosis not present

## 2019-03-28 DIAGNOSIS — E119 Type 2 diabetes mellitus without complications: Secondary | ICD-10-CM | POA: Diagnosis not present

## 2019-03-28 DIAGNOSIS — R600 Localized edema: Secondary | ICD-10-CM | POA: Diagnosis not present

## 2019-03-28 DIAGNOSIS — K5909 Other constipation: Secondary | ICD-10-CM | POA: Diagnosis not present

## 2019-04-03 DIAGNOSIS — Z79899 Other long term (current) drug therapy: Secondary | ICD-10-CM | POA: Diagnosis not present

## 2019-04-03 DIAGNOSIS — E08311 Diabetes mellitus due to underlying condition with unspecified diabetic retinopathy with macular edema: Secondary | ICD-10-CM | POA: Diagnosis not present

## 2019-04-10 ENCOUNTER — Encounter (INDEPENDENT_AMBULATORY_CARE_PROVIDER_SITE_OTHER): Payer: Self-pay | Admitting: Ophthalmology

## 2019-04-10 ENCOUNTER — Ambulatory Visit (INDEPENDENT_AMBULATORY_CARE_PROVIDER_SITE_OTHER): Payer: Medicare Other | Admitting: Ophthalmology

## 2019-04-10 DIAGNOSIS — H25813 Combined forms of age-related cataract, bilateral: Secondary | ICD-10-CM | POA: Diagnosis not present

## 2019-04-10 DIAGNOSIS — H35011 Changes in retinal vascular appearance, right eye: Secondary | ICD-10-CM | POA: Diagnosis not present

## 2019-04-10 DIAGNOSIS — E113393 Type 2 diabetes mellitus with moderate nonproliferative diabetic retinopathy without macular edema, bilateral: Secondary | ICD-10-CM | POA: Diagnosis not present

## 2019-04-10 DIAGNOSIS — R5381 Other malaise: Secondary | ICD-10-CM | POA: Diagnosis not present

## 2019-04-10 DIAGNOSIS — H3581 Retinal edema: Secondary | ICD-10-CM

## 2019-04-10 DIAGNOSIS — H35033 Hypertensive retinopathy, bilateral: Secondary | ICD-10-CM | POA: Diagnosis not present

## 2019-04-10 DIAGNOSIS — I10 Essential (primary) hypertension: Secondary | ICD-10-CM | POA: Diagnosis not present

## 2019-04-10 DIAGNOSIS — H348312 Tributary (branch) retinal vein occlusion, right eye, stable: Secondary | ICD-10-CM

## 2019-04-10 DIAGNOSIS — W1789XA Other fall from one level to another, initial encounter: Secondary | ICD-10-CM | POA: Diagnosis not present

## 2019-04-10 NOTE — Progress Notes (Signed)
[facility] Note  04/10/2019     CHIEF COMPLAINT Patient presents for Retina Follow Up   HISTORY OF PRESENT ILLNESS: Reginald [name] is a 83 y.o. male who presents to the clinic today for:   HPI    Retina Follow Up    Patient presents with  CRVO/BRVO.  In right eye.  Severity is moderate.  Duration of 2 weeks.  Since onset it is stable.  I, the attending physician,  performed the HPI with the patient and updated documentation appropriately.          Comments    Patient here for PRP OD: states vision the same. BS was 151 this am. Last a1c was 6.7 a few weeks ago.       Last edited by Bernarda Caffey, MD on 04/10/2019  5:36 PM. (History)    pt here for PRP OD today, pt states he is "so-so" today, he states he fell yesterday bc his knee gave out on him  Referring physician: Guadlupe Spanish, MD [phone] [place],  [place] [address]  HISTORICAL INFORMATION:   Selected notes from the MEDICAL RECORD NUMBER Referred by Dr. Martinique DeMarco for concern of hollenhorst plaque OD LEE: 09.01.20 (J. DeMarco) [BCVA: OD: OS:] Ocular Hx-NPDR OU, PVD OD, CRVO with NV OD, PSC OS, cataracts OU, glaucoma OU PMH-CHF, CKD, COPD, HLD, HTN   CURRENT MEDICATIONS: Current Outpatient Medications (Ophthalmic Drugs)  Medication Sig  . latanoprost (XALATAN) 0.005 % ophthalmic solution Place 1 drop into both eyes at bedtime.   No current facility-administered medications for this visit.  (Ophthalmic Drugs)   Current Outpatient Medications (Other)  Medication Sig  . acetaminophen (TYLENOL) 325 MG tablet Take 650 mg by mouth every 4 (four) hours as needed for mild pain.  Marland Kitchen apixaban (ELIQUIS) 2.5 MG TABS tablet Take 1 tablet (2.5 mg total) by mouth 2 (two) times daily.  Marland Kitchen atorvastatin (LIPITOR) 20 MG tablet Take 20 mg by mouth at bedtime.   . cetirizine (ZYRTEC) 10 MG tablet Take 10 mg by mouth at bedtime.  . docusate sodium (COLACE) 100 MG capsule Take 100 mg by mouth at  bedtime.  . Dulaglutide (TRULICITY) 0.32 ZY/2.4MG SOPN Inject 0.5 mLs into the skin every Monday.  . escitalopram (LEXAPRO) 5 MG tablet Take 5 mg by mouth daily.  Marland Kitchen gabapentin (NEURONTIN) 100 MG capsule Take 100 mg by mouth 3 (three) times daily.  Marland Kitchen glipiZIDE (GLUCOTROL) 10 MG tablet   . Magnesium 400 MG TABS Take 400 mg by mouth daily.  . Melatonin 5 MG TABS Take 5 mg by mouth at bedtime.  . Menthol, Topical Analgesic, 4.5 % GEL Apply 1 application topically 2 (two) times daily as needed (pain). Apply to forehead and left arm  . metolazone (ZAROXOLYN) 5 MG tablet   . metoprolol succinate (TOPROL-XL) 25 MG 24 hr tablet Take 25 mg by mouth daily.  . Multiple Vitamin (MULTIVITAMIN WITH MINERALS) TABS tablet Take 1 tablet by mouth every evening.  . nitroGLYCERIN (NITROSTAT) 0.4 MG SL tablet Place 0.4 mg under the tongue every 5 (five) minutes as needed for chest pain.  . phenylephrine-shark liver oil-mineral oil-petrolatum (PREPARATION H) 0.25-3-14-71.9 % rectal ointment Place 1 application rectally 3 (three) times daily.  . Potassium Chloride ER 20 MEQ TBCR Take 20 mEq by mouth daily.  . Skin Protectants, Misc. (EUCERIN) cream Apply 1 application topically 2 (two) times a day.  . tamsulosin (FLOMAX) 0.4 MG CAPS capsule Take  1 capsule (0.4 mg total) by mouth daily after supper.  . torsemide (DEMADEX) 20 MG tablet Take 1 tablet (20 mg total) by mouth daily. Restart on 01/01/2019 after renal function check with BMP and if ok by your PCP/cardiologist.  . vitamin B-12 (CYANOCOBALAMIN) 1000 MCG tablet Take 1,000 mcg by mouth daily.   No current facility-administered medications for this visit.  (Other)      REVIEW OF SYSTEMS: ROS    Positive for: Genitourinary, Cardiovascular, Eyes, Respiratory   Negative for: Constitutional, Gastrointestinal, Neurological, Skin, Musculoskeletal, HENT, Endocrine, Psychiatric, Allergic/Imm, Heme/Lymph   Last edited by Roselee Nova D, COT on 04/10/2019  1:48 PM.  (History)       ALLERGIES Allergies  Allergen Reactions  . Adhesive [Tape] Rash    PAST MEDICAL HISTORY Past Medical History:  Diagnosis Date  . Anteroseptal myocardial infarction ([place])   . CHF (congestive heart failure) ([place])   . Chronic kidney disease   . COPD (chronic obstructive pulmonary disease) ([place])   . Coronary atherosclerosis of unspecified type of vessel, native or graft    a. s/p prior PCI to LAD in 1997  . Diverticulosis   . Glaucoma   . Hypercholesteremia   . Hypertensive renal disease   . Hypokalemia   . Obesity hypoventilation syndrome ([place])   . Obesity, unspecified   . OSA (obstructive sleep apnea)   . Permanent atrial fibrillation   . Rhabdomyolysis   . Type II or unspecified type diabetes mellitus without mention of complication, not stated as uncontrolled   . Unspecified essential hypertension   . Vestibulopathy    Past Surgical History:  Procedure Laterality Date  . APPENDECTOMY  1985  . CARDIAC CATHETERIZATION    . CHOLECYSTECTOMY  06/2000  . CORONARY ANGIOPLASTY  08/14/1995   stent placement to LAD   . ENDOVENOUS ABLATION SAPHENOUS VEIN W/ LASER Left 02/21/2018   endovenous laser ablation L GSV by Ruta Hinds MD   . [place] Right 1985  . KNEE ARTHROSCOPY Left 1991  . LUMBAR FUSION    . NOSE SURGERY  1970s   Per Dr. Terrence Dupont PSH in pt chart    FAMILY HISTORY Family History  Problem Relation Age of Onset  . COPD Mother   . Heart disease Mother   . Diabetes Father   . Heart disease Father   . Diabetes Sister   . Heart disease Brother   . Heart disease Brother   . Breast cancer Maternal Aunt   . Diabetes Paternal Grandmother   . Colon cancer Maternal Aunt   . Ovarian cancer Daughter   . Glaucoma Other     SOCIAL HISTORY Social History   Tobacco Use  . Smoking status: Never Smoker  . Smokeless tobacco: Never Used  Substance Use Topics  . Alcohol use: No    Alcohol/week: 0.0 standard drinks  . Drug use: No          OPHTHALMIC EXAM:  Base Eye Exam    Visual Acuity (Snellen - Linear)      Right Left   Dist cc 20/50 +2 20/50 +2   Dist ph cc NI 20/40 +2   Correction: Glasses       Tonometry (Tonopen, 2:02 PM)      Right Left   Pressure 19 18       Pupils      Dark Light Shape React APD   Right 4 3 Round Brisk None   Left 4 3 Round Brisk None  Visual Fields (Counting fingers)      Left Right    Full Full       Extraocular Movement      Right Left    Full, Ortho Full, Ortho       Neuro/Psych    Oriented x3: Yes   Mood/Affect: Normal       Dilation    Both eyes: 1.0% Mydriacyl, 2.5% Phenylephrine @ 2:02 PM        Slit Lamp and Fundus Exam    Slit Lamp Exam      Right Left   Lids/Lashes Dermatochalasis - upper lid, Meibomian gland dysfunction Dermatochalasis - upper lid, Meibomian gland dysfunction   Conjunctiva/Sclera White and quiet White and quiet   Cornea Arcus, trace Punctate epithelial erosions Arcus, 1+ inferior Punctate epithelial erosions   Anterior Chamber Deep and quiet, narrow temporal angle Deep and quiet, narrow temporal angle   Iris Round and dilated, No NVI Round and dilated, No NVI   Lens 2-3+ Nuclear sclerosis, 2-3+ Cortical cataract 2-3+ Nuclear sclerosis, 2-3+ Cortical cataract   Vitreous mild Vitreous syneresis, Posterior vitreous detachment mild Vitreous syneresis       Fundus Exam      Right Left   Disc nasal flame hemes, blurred margin Pink and Sharp   C/D Ratio 0.5 0.5   Macula Flat, Blunted foveal reflex, scattered IRH Flat, Blunted foveal reflex, Retinal pigment epithelial mottling   Vessels Vascular attenuation, dilated and tortuous venules, mild macroaneursym along superior macular arteriole; remote superior BRVO Vascular attenuation, Tortuous, AV crossing changes   Periphery Attached, scattered DBH superiorly  Attached        Refraction    Wearing Rx      Sphere Cylinder Axis Add   Right -0.50 +2.75 178 +2.75   Left  -1.00 +3.00 007 +2.75   Type: Bifocal          IMAGING AND PROCEDURES  Imaging and Procedures for @TODAY @  OCT, Retina - OU - Both Eyes       Right Eye Quality was good. Central Foveal Thickness: 251. Progression has been stable. Findings include normal foveal contour, no IRF, no SRF (Mildly irregular lamination; trace ERM).   Left Eye Quality was good. Central Foveal Thickness: 240. Progression has been stable. Findings include normal foveal contour, no IRF, no SRF.   Notes *Images captured and stored on drive  Diagnosis / Impression:  NFP, no IRF/SRF OU OD: Mildly irregular lamination; trace ERM  Clinical management:  See below  Abbreviations: NFP - Normal foveal profile. CME - cystoid macular edema. PED - pigment epithelial detachment. IRF - intraretinal fluid. SRF - subretinal fluid. EZ - ellipsoid zone. ERM - epiretinal membrane. ORA - outer retinal atrophy. ORT - outer retinal tubulation. SRHM - subretinal hyper-reflective material        Panretinal Photocoagulation - OD - Right Eye       LASER PROCEDURE NOTE  Diagnosis:   BRVO with peripheral nonperfusion, RIGHT EYE  Procedure:  Segmental pan-retinal photocoagulation using slit lamp laser, RIGHT EYE  Anesthesia:  Topical  Surgeon: Bernarda Caffey, MD, PhD   Informed consent obtained, operative eye marked, and time out performed prior to initiation of laser.   Lumenis [id number] slit lamp laser Pattern: 3x3 square Power: 300 mW Duration: 30 msec  Spot size: 200 microns  # spots: 507 spots superotemporal periphery  Complications: None.  Notes: pt with difficulty positioning in slit lamp  RTC: 6 wks  Patient tolerated the procedure  well and received written and verbal post-procedure care information/education.                  ASSESSMENT/PLAN:    ICD-10-CM   1. 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 Retinal  macroaneurysm, right eye  H35.011   3. 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 Retinal edema  H35.81 OCT, Retina - OU - Both Eyes  5. Essential hypertension  I10   6. Hypertensive retinopathy of both eyes  H35.033   7. Combined forms of age-related cataract of both eyes  H25.813     1,2. 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 Milus Height at [facility] -- will try to obtain records  - BCVA: OD: 20/50  - OCT shows mildly irregular lamination but no CME or IRF  - FA shows superior retinal arterial macroaeurysm, telangectatic vessels in superior macular and capillary drop out temporal periphery with late perivascular leakage just proximal to temporal peripheral patch of vascular nonperfusion  - recommend segmental PRP to areas of capillary drop out OD today, 09.30.20  - pt wishes to proceed  - RBA of procedure discussed, questions answered  - informed consent obtained and signed  - see procedure note  - F/U 6 wks -- DFE/OCT  3. Moderate nonproliferative diabetic retinopathy w/o DME, OU  - exam shows scattered MA and IRH OU  - FA (09.15.20) shows scattered MA OU  - OCT without diabetic macular edema, OU  - monitor  4. No retinal edema on exam or OCT  5,6. Hypertensive retinopathy OU  - discussed importance of tight BP control  - monitor  7. Mixed form age related cataract OU  - The symptoms of cataract, surgical options, and treatments and risks were discussed with patient.  - discussed diagnosis and progression  - not yet visually significant  - monitor for now    Ophthalmic Meds Ordered this visit:  No orders of the defined types were placed in this encounter.      Return in about 6 weeks (around 05/22/2019) for f/u BRVO OD, Dilated Exam, OCT.  There are no  Patient Instructions on file for this visit.   Explained the diagnoses, plan, and follow up with the patient and they expressed understanding.  Patient expressed understanding of the importance of proper follow up care.   This document serves as a record of services personally performed by Gardiner Sleeper, MD, PhD. It was created on their behalf by Ernest Mallick, OA, an ophthalmic assistant. The creation of this record is the provider's dictation and/or activities during the visit.    Electronically signed by: Ernest Mallick, OA 09.30.2020 5:41 PM     Gardiner Sleeper, M.D., Ph.D. Diseases & Surgery of the Retina and Vitreous [facility]  I have reviewed the above documentation for accuracy and completeness, and I agree with the above. Gardiner Sleeper, M.D., Ph.D. 04/10/19 5:41 PM    Abbreviations: M myopia (nearsighted); A astigmatism; H hyperopia (farsighted); P presbyopia; Mrx spectacle prescription;  CTL contact lenses; OD right eye; OS left eye; OU both eyes  XT exotropia; ET esotropia; PEK punctate epithelial keratitis; PEE punctate epithelial erosions; DES dry eye syndrome; MGD meibomian gland dysfunction; ATs artificial tears;  PFAT's preservative free artificial tears; [place] nuclear sclerotic cataract; PSC posterior subcapsular cataract; ERM epi-retinal membrane; PVD posterior vitreous detachment; RD retinal detachment; DM diabetes mellitus; DR diabetic retinopathy; NPDR non-proliferative diabetic retinopathy; PDR proliferative diabetic retinopathy; CSME clinically significant macular edema; DME diabetic macular edema; dbh dot blot hemorrhages; CWS cotton wool spot; POAG primary open angle glaucoma; C/D cup-to-disc ratio; HVF humphrey visual field; GVF goldmann visual field; OCT optical coherence tomography; IOP intraocular pressure; BRVO Branch retinal vein occlusion; CRVO central retinal vein occlusion; CRAO central retinal artery occlusion; BRAO branch retinal artery  occlusion; RT retinal tear; SB scleral buckle; PPV pars plana vitrectomy; VH Vitreous hemorrhage; PRP panretinal laser photocoagulation; IVK intravitreal kenalog; VMT vitreomacular traction; MH Macular hole;  NVD neovascularization of the disc; NVE neovascularization elsewhere; AREDS age related eye disease study; ARMD age related macular degeneration; POAG primary open angle glaucoma; EBMD epithelial/anterior basement membrane dystrophy; ACIOL anterior chamber intraocular lens; IOL intraocular lens; PCIOL posterior chamber intraocular lens; Phaco/IOL phacoemulsification with intraocular lens placement; [place] photorefractive keratectomy; LASIK laser assisted in situ keratomileusis; HTN hypertension; DM diabetes mellitus; COPD chronic obstructive pulmonary disease

## 2019-04-12 DIAGNOSIS — Z9181 History of falling: Secondary | ICD-10-CM | POA: Diagnosis not present

## 2019-04-12 DIAGNOSIS — J189 Pneumonia, unspecified organism: Secondary | ICD-10-CM | POA: Diagnosis not present

## 2019-04-12 DIAGNOSIS — R2689 Other abnormalities of gait and mobility: Secondary | ICD-10-CM | POA: Diagnosis not present

## 2019-04-12 DIAGNOSIS — N184 Chronic kidney disease, stage 4 (severe): Secondary | ICD-10-CM | POA: Diagnosis not present

## 2019-04-12 DIAGNOSIS — I480 Paroxysmal atrial fibrillation: Secondary | ICD-10-CM | POA: Diagnosis not present

## 2019-04-12 DIAGNOSIS — M6281 Muscle weakness (generalized): Secondary | ICD-10-CM | POA: Diagnosis not present

## 2019-04-12 DIAGNOSIS — R2681 Unsteadiness on feet: Secondary | ICD-10-CM | POA: Diagnosis not present

## 2019-04-12 DIAGNOSIS — J449 Chronic obstructive pulmonary disease, unspecified: Secondary | ICD-10-CM | POA: Diagnosis not present

## 2019-04-12 DIAGNOSIS — G9341 Metabolic encephalopathy: Secondary | ICD-10-CM | POA: Diagnosis not present

## 2019-04-12 DIAGNOSIS — R278 Other lack of coordination: Secondary | ICD-10-CM | POA: Diagnosis not present

## 2019-04-16 DIAGNOSIS — U071 COVID-19: Secondary | ICD-10-CM | POA: Diagnosis not present

## 2019-04-16 DIAGNOSIS — I10 Essential (primary) hypertension: Secondary | ICD-10-CM | POA: Diagnosis not present

## 2019-04-16 DIAGNOSIS — E119 Type 2 diabetes mellitus without complications: Secondary | ICD-10-CM | POA: Diagnosis not present

## 2019-04-17 DIAGNOSIS — Z23 Encounter for immunization: Secondary | ICD-10-CM | POA: Diagnosis not present

## 2019-04-23 DIAGNOSIS — R2689 Other abnormalities of gait and mobility: Secondary | ICD-10-CM | POA: Diagnosis not present

## 2019-04-23 DIAGNOSIS — G9341 Metabolic encephalopathy: Secondary | ICD-10-CM | POA: Diagnosis not present

## 2019-04-23 DIAGNOSIS — I480 Paroxysmal atrial fibrillation: Secondary | ICD-10-CM | POA: Diagnosis not present

## 2019-04-23 DIAGNOSIS — U071 COVID-19: Secondary | ICD-10-CM | POA: Diagnosis not present

## 2019-04-23 DIAGNOSIS — M6281 Muscle weakness (generalized): Secondary | ICD-10-CM | POA: Diagnosis not present

## 2019-04-23 DIAGNOSIS — N184 Chronic kidney disease, stage 4 (severe): Secondary | ICD-10-CM | POA: Diagnosis not present

## 2019-04-24 DIAGNOSIS — E119 Type 2 diabetes mellitus without complications: Secondary | ICD-10-CM | POA: Diagnosis not present

## 2019-04-24 DIAGNOSIS — N184 Chronic kidney disease, stage 4 (severe): Secondary | ICD-10-CM | POA: Diagnosis not present

## 2019-04-24 DIAGNOSIS — Z79899 Other long term (current) drug therapy: Secondary | ICD-10-CM | POA: Diagnosis not present

## 2019-04-24 DIAGNOSIS — M6281 Muscle weakness (generalized): Secondary | ICD-10-CM | POA: Diagnosis not present

## 2019-04-24 DIAGNOSIS — G9341 Metabolic encephalopathy: Secondary | ICD-10-CM | POA: Diagnosis not present

## 2019-04-24 DIAGNOSIS — R2689 Other abnormalities of gait and mobility: Secondary | ICD-10-CM | POA: Diagnosis not present

## 2019-04-24 DIAGNOSIS — I480 Paroxysmal atrial fibrillation: Secondary | ICD-10-CM | POA: Diagnosis not present

## 2019-04-25 DIAGNOSIS — M6281 Muscle weakness (generalized): Secondary | ICD-10-CM | POA: Diagnosis not present

## 2019-04-25 DIAGNOSIS — R2689 Other abnormalities of gait and mobility: Secondary | ICD-10-CM | POA: Diagnosis not present

## 2019-04-25 DIAGNOSIS — G9341 Metabolic encephalopathy: Secondary | ICD-10-CM | POA: Diagnosis not present

## 2019-04-25 DIAGNOSIS — N184 Chronic kidney disease, stage 4 (severe): Secondary | ICD-10-CM | POA: Diagnosis not present

## 2019-04-25 DIAGNOSIS — I480 Paroxysmal atrial fibrillation: Secondary | ICD-10-CM | POA: Diagnosis not present

## 2019-04-26 DIAGNOSIS — N184 Chronic kidney disease, stage 4 (severe): Secondary | ICD-10-CM | POA: Diagnosis not present

## 2019-04-26 DIAGNOSIS — I480 Paroxysmal atrial fibrillation: Secondary | ICD-10-CM | POA: Diagnosis not present

## 2019-04-26 DIAGNOSIS — G9341 Metabolic encephalopathy: Secondary | ICD-10-CM | POA: Diagnosis not present

## 2019-04-26 DIAGNOSIS — M6281 Muscle weakness (generalized): Secondary | ICD-10-CM | POA: Diagnosis not present

## 2019-04-26 DIAGNOSIS — R2689 Other abnormalities of gait and mobility: Secondary | ICD-10-CM | POA: Diagnosis not present

## 2019-04-29 DIAGNOSIS — G9341 Metabolic encephalopathy: Secondary | ICD-10-CM | POA: Diagnosis not present

## 2019-04-29 DIAGNOSIS — N184 Chronic kidney disease, stage 4 (severe): Secondary | ICD-10-CM | POA: Diagnosis not present

## 2019-04-29 DIAGNOSIS — M6281 Muscle weakness (generalized): Secondary | ICD-10-CM | POA: Diagnosis not present

## 2019-04-29 DIAGNOSIS — I480 Paroxysmal atrial fibrillation: Secondary | ICD-10-CM | POA: Diagnosis not present

## 2019-04-29 DIAGNOSIS — R2689 Other abnormalities of gait and mobility: Secondary | ICD-10-CM | POA: Diagnosis not present

## 2019-04-30 DIAGNOSIS — N184 Chronic kidney disease, stage 4 (severe): Secondary | ICD-10-CM | POA: Diagnosis not present

## 2019-04-30 DIAGNOSIS — E1121 Type 2 diabetes mellitus with diabetic nephropathy: Secondary | ICD-10-CM | POA: Diagnosis not present

## 2019-04-30 DIAGNOSIS — G9341 Metabolic encephalopathy: Secondary | ICD-10-CM | POA: Diagnosis not present

## 2019-04-30 DIAGNOSIS — F3489 Other specified persistent mood disorders: Secondary | ICD-10-CM | POA: Diagnosis not present

## 2019-04-30 DIAGNOSIS — Z0189 Encounter for other specified special examinations: Secondary | ICD-10-CM | POA: Diagnosis not present

## 2019-04-30 DIAGNOSIS — I480 Paroxysmal atrial fibrillation: Secondary | ICD-10-CM | POA: Diagnosis not present

## 2019-04-30 DIAGNOSIS — M6281 Muscle weakness (generalized): Secondary | ICD-10-CM | POA: Diagnosis not present

## 2019-04-30 DIAGNOSIS — R2689 Other abnormalities of gait and mobility: Secondary | ICD-10-CM | POA: Diagnosis not present

## 2019-05-01 DIAGNOSIS — G9341 Metabolic encephalopathy: Secondary | ICD-10-CM | POA: Diagnosis not present

## 2019-05-01 DIAGNOSIS — Z20828 Contact with and (suspected) exposure to other viral communicable diseases: Secondary | ICD-10-CM | POA: Diagnosis not present

## 2019-05-01 DIAGNOSIS — M6281 Muscle weakness (generalized): Secondary | ICD-10-CM | POA: Diagnosis not present

## 2019-05-01 DIAGNOSIS — I480 Paroxysmal atrial fibrillation: Secondary | ICD-10-CM | POA: Diagnosis not present

## 2019-05-01 DIAGNOSIS — N184 Chronic kidney disease, stage 4 (severe): Secondary | ICD-10-CM | POA: Diagnosis not present

## 2019-05-01 DIAGNOSIS — R2689 Other abnormalities of gait and mobility: Secondary | ICD-10-CM | POA: Diagnosis not present

## 2019-05-02 DIAGNOSIS — R2689 Other abnormalities of gait and mobility: Secondary | ICD-10-CM | POA: Diagnosis not present

## 2019-05-02 DIAGNOSIS — I480 Paroxysmal atrial fibrillation: Secondary | ICD-10-CM | POA: Diagnosis not present

## 2019-05-02 DIAGNOSIS — N184 Chronic kidney disease, stage 4 (severe): Secondary | ICD-10-CM | POA: Diagnosis not present

## 2019-05-02 DIAGNOSIS — M6281 Muscle weakness (generalized): Secondary | ICD-10-CM | POA: Diagnosis not present

## 2019-05-02 DIAGNOSIS — G9341 Metabolic encephalopathy: Secondary | ICD-10-CM | POA: Diagnosis not present

## 2019-05-03 DIAGNOSIS — G9341 Metabolic encephalopathy: Secondary | ICD-10-CM | POA: Diagnosis not present

## 2019-05-03 DIAGNOSIS — R2689 Other abnormalities of gait and mobility: Secondary | ICD-10-CM | POA: Diagnosis not present

## 2019-05-03 DIAGNOSIS — M6281 Muscle weakness (generalized): Secondary | ICD-10-CM | POA: Diagnosis not present

## 2019-05-03 DIAGNOSIS — N184 Chronic kidney disease, stage 4 (severe): Secondary | ICD-10-CM | POA: Diagnosis not present

## 2019-05-03 DIAGNOSIS — I480 Paroxysmal atrial fibrillation: Secondary | ICD-10-CM | POA: Diagnosis not present

## 2019-05-05 DIAGNOSIS — I480 Paroxysmal atrial fibrillation: Secondary | ICD-10-CM | POA: Diagnosis not present

## 2019-05-05 DIAGNOSIS — M6281 Muscle weakness (generalized): Secondary | ICD-10-CM | POA: Diagnosis not present

## 2019-05-05 DIAGNOSIS — R2689 Other abnormalities of gait and mobility: Secondary | ICD-10-CM | POA: Diagnosis not present

## 2019-05-05 DIAGNOSIS — G9341 Metabolic encephalopathy: Secondary | ICD-10-CM | POA: Diagnosis not present

## 2019-05-05 DIAGNOSIS — N184 Chronic kidney disease, stage 4 (severe): Secondary | ICD-10-CM | POA: Diagnosis not present

## 2019-05-07 DIAGNOSIS — R2689 Other abnormalities of gait and mobility: Secondary | ICD-10-CM | POA: Diagnosis not present

## 2019-05-07 DIAGNOSIS — G9341 Metabolic encephalopathy: Secondary | ICD-10-CM | POA: Diagnosis not present

## 2019-05-07 DIAGNOSIS — M6281 Muscle weakness (generalized): Secondary | ICD-10-CM | POA: Diagnosis not present

## 2019-05-07 DIAGNOSIS — I480 Paroxysmal atrial fibrillation: Secondary | ICD-10-CM | POA: Diagnosis not present

## 2019-05-07 DIAGNOSIS — U071 COVID-19: Secondary | ICD-10-CM | POA: Diagnosis not present

## 2019-05-07 DIAGNOSIS — N184 Chronic kidney disease, stage 4 (severe): Secondary | ICD-10-CM | POA: Diagnosis not present

## 2019-05-10 DIAGNOSIS — N184 Chronic kidney disease, stage 4 (severe): Secondary | ICD-10-CM | POA: Diagnosis not present

## 2019-05-10 DIAGNOSIS — M6281 Muscle weakness (generalized): Secondary | ICD-10-CM | POA: Diagnosis not present

## 2019-05-10 DIAGNOSIS — I480 Paroxysmal atrial fibrillation: Secondary | ICD-10-CM | POA: Diagnosis not present

## 2019-05-10 DIAGNOSIS — R2689 Other abnormalities of gait and mobility: Secondary | ICD-10-CM | POA: Diagnosis not present

## 2019-05-10 DIAGNOSIS — G9341 Metabolic encephalopathy: Secondary | ICD-10-CM | POA: Diagnosis not present

## 2019-05-11 DIAGNOSIS — R2689 Other abnormalities of gait and mobility: Secondary | ICD-10-CM | POA: Diagnosis not present

## 2019-05-11 DIAGNOSIS — M6281 Muscle weakness (generalized): Secondary | ICD-10-CM | POA: Diagnosis not present

## 2019-05-11 DIAGNOSIS — G9341 Metabolic encephalopathy: Secondary | ICD-10-CM | POA: Diagnosis not present

## 2019-05-11 DIAGNOSIS — N184 Chronic kidney disease, stage 4 (severe): Secondary | ICD-10-CM | POA: Diagnosis not present

## 2019-05-11 DIAGNOSIS — I480 Paroxysmal atrial fibrillation: Secondary | ICD-10-CM | POA: Diagnosis not present

## 2019-05-12 DIAGNOSIS — M6281 Muscle weakness (generalized): Secondary | ICD-10-CM | POA: Diagnosis not present

## 2019-05-12 DIAGNOSIS — N184 Chronic kidney disease, stage 4 (severe): Secondary | ICD-10-CM | POA: Diagnosis not present

## 2019-05-12 DIAGNOSIS — I480 Paroxysmal atrial fibrillation: Secondary | ICD-10-CM | POA: Diagnosis not present

## 2019-05-12 DIAGNOSIS — R278 Other lack of coordination: Secondary | ICD-10-CM | POA: Diagnosis not present

## 2019-05-12 DIAGNOSIS — R2681 Unsteadiness on feet: Secondary | ICD-10-CM | POA: Diagnosis not present

## 2019-05-12 DIAGNOSIS — R2689 Other abnormalities of gait and mobility: Secondary | ICD-10-CM | POA: Diagnosis not present

## 2019-05-12 DIAGNOSIS — Z9181 History of falling: Secondary | ICD-10-CM | POA: Diagnosis not present

## 2019-05-12 DIAGNOSIS — G9341 Metabolic encephalopathy: Secondary | ICD-10-CM | POA: Diagnosis not present

## 2019-05-12 DIAGNOSIS — J449 Chronic obstructive pulmonary disease, unspecified: Secondary | ICD-10-CM | POA: Diagnosis not present

## 2019-05-13 DIAGNOSIS — R2689 Other abnormalities of gait and mobility: Secondary | ICD-10-CM | POA: Diagnosis not present

## 2019-05-13 DIAGNOSIS — M6281 Muscle weakness (generalized): Secondary | ICD-10-CM | POA: Diagnosis not present

## 2019-05-13 DIAGNOSIS — J449 Chronic obstructive pulmonary disease, unspecified: Secondary | ICD-10-CM | POA: Diagnosis not present

## 2019-05-13 DIAGNOSIS — N184 Chronic kidney disease, stage 4 (severe): Secondary | ICD-10-CM | POA: Diagnosis not present

## 2019-05-13 DIAGNOSIS — G9341 Metabolic encephalopathy: Secondary | ICD-10-CM | POA: Diagnosis not present

## 2019-05-14 DIAGNOSIS — G9341 Metabolic encephalopathy: Secondary | ICD-10-CM | POA: Diagnosis not present

## 2019-05-14 DIAGNOSIS — N184 Chronic kidney disease, stage 4 (severe): Secondary | ICD-10-CM | POA: Diagnosis not present

## 2019-05-14 DIAGNOSIS — R2689 Other abnormalities of gait and mobility: Secondary | ICD-10-CM | POA: Diagnosis not present

## 2019-05-14 DIAGNOSIS — M6281 Muscle weakness (generalized): Secondary | ICD-10-CM | POA: Diagnosis not present

## 2019-05-14 DIAGNOSIS — J449 Chronic obstructive pulmonary disease, unspecified: Secondary | ICD-10-CM | POA: Diagnosis not present

## 2019-05-14 DIAGNOSIS — U071 COVID-19: Secondary | ICD-10-CM | POA: Diagnosis not present

## 2019-05-16 DIAGNOSIS — R2689 Other abnormalities of gait and mobility: Secondary | ICD-10-CM | POA: Diagnosis not present

## 2019-05-16 DIAGNOSIS — N184 Chronic kidney disease, stage 4 (severe): Secondary | ICD-10-CM | POA: Diagnosis not present

## 2019-05-16 DIAGNOSIS — M6281 Muscle weakness (generalized): Secondary | ICD-10-CM | POA: Diagnosis not present

## 2019-05-16 DIAGNOSIS — J449 Chronic obstructive pulmonary disease, unspecified: Secondary | ICD-10-CM | POA: Diagnosis not present

## 2019-05-16 DIAGNOSIS — G9341 Metabolic encephalopathy: Secondary | ICD-10-CM | POA: Diagnosis not present

## 2019-05-17 DIAGNOSIS — M6281 Muscle weakness (generalized): Secondary | ICD-10-CM | POA: Diagnosis not present

## 2019-05-17 DIAGNOSIS — G9341 Metabolic encephalopathy: Secondary | ICD-10-CM | POA: Diagnosis not present

## 2019-05-17 DIAGNOSIS — J449 Chronic obstructive pulmonary disease, unspecified: Secondary | ICD-10-CM | POA: Diagnosis not present

## 2019-05-17 DIAGNOSIS — R2689 Other abnormalities of gait and mobility: Secondary | ICD-10-CM | POA: Diagnosis not present

## 2019-05-17 DIAGNOSIS — N184 Chronic kidney disease, stage 4 (severe): Secondary | ICD-10-CM | POA: Diagnosis not present

## 2019-05-19 DIAGNOSIS — R2689 Other abnormalities of gait and mobility: Secondary | ICD-10-CM | POA: Diagnosis not present

## 2019-05-19 DIAGNOSIS — J449 Chronic obstructive pulmonary disease, unspecified: Secondary | ICD-10-CM | POA: Diagnosis not present

## 2019-05-19 DIAGNOSIS — N184 Chronic kidney disease, stage 4 (severe): Secondary | ICD-10-CM | POA: Diagnosis not present

## 2019-05-19 DIAGNOSIS — M6281 Muscle weakness (generalized): Secondary | ICD-10-CM | POA: Diagnosis not present

## 2019-05-19 DIAGNOSIS — G9341 Metabolic encephalopathy: Secondary | ICD-10-CM | POA: Diagnosis not present

## 2019-05-20 DIAGNOSIS — J449 Chronic obstructive pulmonary disease, unspecified: Secondary | ICD-10-CM | POA: Diagnosis not present

## 2019-05-20 DIAGNOSIS — R2689 Other abnormalities of gait and mobility: Secondary | ICD-10-CM | POA: Diagnosis not present

## 2019-05-20 DIAGNOSIS — M6281 Muscle weakness (generalized): Secondary | ICD-10-CM | POA: Diagnosis not present

## 2019-05-20 DIAGNOSIS — N184 Chronic kidney disease, stage 4 (severe): Secondary | ICD-10-CM | POA: Diagnosis not present

## 2019-05-20 DIAGNOSIS — G9341 Metabolic encephalopathy: Secondary | ICD-10-CM | POA: Diagnosis not present

## 2019-05-21 DIAGNOSIS — M6281 Muscle weakness (generalized): Secondary | ICD-10-CM | POA: Diagnosis not present

## 2019-05-21 DIAGNOSIS — I872 Venous insufficiency (chronic) (peripheral): Secondary | ICD-10-CM | POA: Diagnosis not present

## 2019-05-21 DIAGNOSIS — N184 Chronic kidney disease, stage 4 (severe): Secondary | ICD-10-CM | POA: Diagnosis not present

## 2019-05-21 DIAGNOSIS — R2689 Other abnormalities of gait and mobility: Secondary | ICD-10-CM | POA: Diagnosis not present

## 2019-05-21 DIAGNOSIS — E0841 Diabetes mellitus due to underlying condition with diabetic mononeuropathy: Secondary | ICD-10-CM | POA: Diagnosis not present

## 2019-05-21 DIAGNOSIS — I5032 Chronic diastolic (congestive) heart failure: Secondary | ICD-10-CM | POA: Diagnosis not present

## 2019-05-21 DIAGNOSIS — J449 Chronic obstructive pulmonary disease, unspecified: Secondary | ICD-10-CM | POA: Diagnosis not present

## 2019-05-21 DIAGNOSIS — G9341 Metabolic encephalopathy: Secondary | ICD-10-CM | POA: Diagnosis not present

## 2019-05-21 DIAGNOSIS — I480 Paroxysmal atrial fibrillation: Secondary | ICD-10-CM | POA: Diagnosis not present

## 2019-05-21 DIAGNOSIS — U071 COVID-19: Secondary | ICD-10-CM | POA: Diagnosis not present

## 2019-05-21 DIAGNOSIS — R4189 Other symptoms and signs involving cognitive functions and awareness: Secondary | ICD-10-CM | POA: Diagnosis not present

## 2019-05-21 NOTE — Progress Notes (Signed)
[facility] Note  05/22/2019     CHIEF COMPLAINT Patient presents for Retina Follow Up   HISTORY OF PRESENT ILLNESS: Reginald [name] is a 83 y.o. male who presents to the clinic today for:   HPI    Retina Follow Up    Patient presents with  CRVO/BRVO.  In right eye.  This started 6 weeks ago.  Severity is moderate.  I, the attending physician,  performed the HPI with the patient and updated documentation appropriately.          Comments    Patient here for 6 weeks retina follow up for BRVO with macroaneurysm OD. Patient states vision doing good. No eye pain.        Last edited by Bernarda Caffey, MD on 05/26/2019  4:39 PM. (History)    pt here    Referring physician: Demarco, [place], [address] [place],  [place] [id number]  HISTORICAL INFORMATION:   Selected notes from the MEDICAL RECORD NUMBER Referred by Dr. [place] DeMarco for concern of hollenhorst plaque OD LEE: 09.01.20 (J. DeMarco) [BCVA: OD: OS:] Ocular Hx-NPDR OU, PVD OD, CRVO with NV OD, PSC OS, cataracts OU, glaucoma OU PMH-CHF, CKD, COPD, HLD, HTN   CURRENT MEDICATIONS: Current Outpatient Medications (Ophthalmic Drugs)  Medication Sig  . latanoprost (XALATAN) 0.005 % ophthalmic solution Place 1 drop into both eyes at bedtime.   No current facility-administered medications for this visit.  (Ophthalmic Drugs)   Current Outpatient Medications (Other)  Medication Sig  . acetaminophen (TYLENOL) 325 MG tablet Take 650 mg by mouth every 4 (four) hours as needed for mild pain.  Marland Kitchen apixaban (ELIQUIS) 2.5 MG TABS tablet Take 1 tablet (2.5 mg total) by mouth 2 (two) times daily.  Marland Kitchen atorvastatin (LIPITOR) 20 MG tablet Take 20 mg by mouth at bedtime.   . cetirizine (ZYRTEC) 10 MG tablet Take 10 mg by mouth at bedtime.  . docusate sodium (COLACE) 100 MG capsule Take 100 mg by mouth at bedtime.  . Dulaglutide (TRULICITY) 1.91 YN/8.2NF SOPN Inject 0.5 mLs into the skin every Monday.   . escitalopram (LEXAPRO) 5 MG tablet Take 5 mg by mouth daily.  Marland Kitchen gabapentin (NEURONTIN) 100 MG capsule Take 100 mg by mouth 3 (three) times daily.  Marland Kitchen glipiZIDE (GLUCOTROL) 10 MG tablet   . Magnesium 400 MG TABS Take 400 mg by mouth daily.  . Melatonin 5 MG TABS Take 5 mg by mouth at bedtime.  . Menthol, Topical Analgesic, 4.5 % GEL Apply 1 application topically 2 (two) times daily as needed (pain). Apply to forehead and left arm  . metolazone (ZAROXOLYN) 5 MG tablet   . metoprolol succinate (TOPROL-XL) 25 MG 24 hr tablet Take 25 mg by mouth daily.  . Multiple Vitamin (MULTIVITAMIN WITH MINERALS) TABS tablet Take 1 tablet by mouth every evening.  . nitroGLYCERIN (NITROSTAT) 0.4 MG SL tablet Place 0.4 mg under the tongue every 5 (five) minutes as needed for chest pain.  . phenylephrine-shark liver oil-mineral oil-petrolatum (PREPARATION H) 0.25-3-14-71.9 % rectal ointment Place 1 application rectally 3 (three) times daily.  . Potassium Chloride ER 20 MEQ TBCR Take 20 mEq by mouth daily.  . Skin Protectants, Misc. (EUCERIN) cream Apply 1 application topically 2 (two) times a day.  . tamsulosin (FLOMAX) 0.4 MG CAPS capsule Take 1 capsule (0.4 mg total) by mouth daily after supper.  . torsemide (DEMADEX) 20 MG tablet Take 1 tablet (20 mg total) by mouth daily. Restart  on 01/01/2019 after renal function check with BMP and if ok by your PCP/cardiologist.  . vitamin B-12 (CYANOCOBALAMIN) 1000 MCG tablet Take 1,000 mcg by mouth daily.   No current facility-administered medications for this visit.  (Other)      REVIEW OF SYSTEMS: ROS    Positive for: Genitourinary, Cardiovascular, Eyes, Respiratory   Negative for: Constitutional, Gastrointestinal, Neurological, Skin, Musculoskeletal, HENT, Endocrine, Psychiatric, Allergic/Imm, Heme/Lymph   Last edited by Theodore Demark, COA on 05/22/2019  2:41 PM. (History)       ALLERGIES Allergies  Allergen Reactions  . Adhesive [Tape] Rash    PAST  MEDICAL HISTORY Past Medical History:  Diagnosis Date  . Anteroseptal myocardial infarction ([place])   . CHF (congestive heart failure) ([place])   . Chronic kidney disease   . COPD (chronic obstructive pulmonary disease) ([place])   . Coronary atherosclerosis of unspecified type of vessel, native or graft    a. s/p prior PCI to LAD in 1997  . Diverticulosis   . Glaucoma   . Hypercholesteremia   . Hypertensive renal disease   . Hypokalemia   . Obesity hypoventilation syndrome ([place])   . Obesity, unspecified   . OSA (obstructive sleep apnea)   . Permanent atrial fibrillation ([place])   . Rhabdomyolysis   . Type II or unspecified type diabetes mellitus without mention of complication, not stated as uncontrolled   . Unspecified essential hypertension   . Vestibulopathy    Past Surgical History:  Procedure Laterality Date  . APPENDECTOMY  1985  . CARDIAC CATHETERIZATION    . CHOLECYSTECTOMY  06/2000  . CORONARY ANGIOPLASTY  08/14/1995   stent placement to LAD   . ENDOVENOUS ABLATION SAPHENOUS VEIN W/ LASER Left 02/21/2018   endovenous laser ablation L GSV by Ruta Hinds MD   . [place] Right 1985  . KNEE ARTHROSCOPY Left 1991  . LUMBAR FUSION    . NOSE SURGERY  1970s   Per Dr. Terrence Dupont PSH in pt chart    FAMILY HISTORY Family History  Problem Relation Age of Onset  . COPD Mother   . Heart disease Mother   . Diabetes Father   . Heart disease Father   . Diabetes Sister   . Heart disease Brother   . Heart disease Brother   . Breast cancer Maternal Aunt   . Diabetes Paternal Grandmother   . Colon cancer Maternal Aunt   . Ovarian cancer Daughter   . Glaucoma Other     SOCIAL HISTORY Social History   Tobacco Use  . Smoking status: Never Smoker  . Smokeless tobacco: Never Used  Substance Use Topics  . Alcohol use: No    Alcohol/week: 0.0 standard drinks  . Drug use: No         OPHTHALMIC EXAM:  Base Eye Exam    Visual Acuity (Snellen - Linear)       Right Left   Dist cc 20/50 -1 20/40 -2   Dist ph cc 20/40 NI   Correction: Glasses       Tonometry (Tonopen, 2:38 PM)      Right Left   Pressure 20 18       Pupils      Dark Light Shape React APD   Right 4 3 Round Brisk None   Left 4 3 Round Brisk None       Visual Fields (Counting fingers)      Left Right    Full Full  Extraocular Movement      Right Left    Full, Ortho Full, Ortho       Neuro/Psych    Oriented x3: Yes   Mood/Affect: Normal       Dilation    Both eyes: 1.0% Mydriacyl, 2.5% Phenylephrine @ 2:38 PM        Slit Lamp and Fundus Exam    Slit Lamp Exam      Right Left   Lids/Lashes Dermatochalasis - upper lid, Meibomian gland dysfunction Dermatochalasis - upper lid, Meibomian gland dysfunction   Conjunctiva/Sclera White and quiet White and quiet   Cornea Arcus, trace Punctate epithelial erosions Arcus, 1+ inferior Punctate epithelial erosions   Anterior Chamber Deep and quiet, narrow temporal angle Deep and quiet, narrow temporal angle   Iris Round and dilated, No NVI Round and dilated, No NVI   Lens 2-3+ Nuclear sclerosis, 2-3+ Cortical cataract 2-3+ Nuclear sclerosis, 2-3+ Cortical cataract   Vitreous mild Vitreous syneresis, Posterior vitreous detachment mild Vitreous syneresis       Fundus Exam      Right Left   Disc nasal flame hemes, blurred margin Pink and Sharp   C/D Ratio 0.5 0.5   Macula Flat, Blunted foveal reflex, scattered IRH Flat, Blunted foveal reflex, Retinal pigment epithelial mottling   Vessels Vascular attenuation, dilated and tortuous venules, mild macroaneursym along superior macular arteriole; remote superior BRVO Vascular attenuation, Tortuous, AV crossing changes   Periphery Attached, scattered DBH superiorly, segmental PRP ST quad - very limited Attached        Refraction    Wearing Rx      Sphere Cylinder Axis Add   Right -0.50 +2.75 178 +2.75   Left -1.00 +3.00 007 +2.75   Type: Bifocal          IMAGING  AND PROCEDURES  Imaging and Procedures for @TODAY @  OCT, Retina - OU - Both Eyes       Right Eye Quality was good. Central Foveal Thickness: 253. Progression has been stable. Findings include normal foveal contour, no IRF, no SRF (Mildly irregular lamination; trace ERM).   Left Eye Quality was good. Central Foveal Thickness: 242. Progression has been stable. Findings include normal foveal contour, no IRF, no SRF.   Notes *Images captured and stored on drive  Diagnosis / Impression:  NFP, no IRF/SRF OU OD: Mildly irregular lamination; trace ERM  Clinical management:  See below  Abbreviations: NFP - Normal foveal profile. CME - cystoid macular edema. PED - pigment epithelial detachment. IRF - intraretinal fluid. SRF - subretinal fluid. EZ - ellipsoid zone. ERM - epiretinal membrane. ORA - outer retinal atrophy. ORT - outer retinal tubulation. SRHM - subretinal hyper-reflective material                 ASSESSMENT/PLAN:    ICD-10-CM   1. 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 Retinal macroaneurysm, right eye  H35.011   3. Moderate nonproliferative diabetic retinopathy of both eyes without macular edema associated with type 2 diabetes mellitus ([place])  F02.7741   4. Retinal edema  H35.81 OCT, Retina - OU - Both Eyes  5. Essential hypertension  I10   6. Hypertensive retinopathy of both eyes  H35.033   7. Combined forms of age-related cataract of both eyes  H25.813     1,2. 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 Milus [facility] at [facility] -- will try  to obtain records  - BCVA: OD: 20/50  - OCT shows mildly irregular lamination but no CME or IRF  - FA (09.15.20) shows superior retinal arterial macroaeurysm, telangectatic vessels in superior macular and capillary drop out temporal periphery with late perivascular leakage just proximal to temporal peripheral patch of vascular  nonperfusion  - s/p segmental PRP to areas of capillary drop out OD (09.30.20) - very limited  - F/U 2 months -- DFE/OCT/possible supplemental laser OD  3. Moderate nonproliferative diabetic retinopathy w/o DME, OU  - exam shows scattered MA and IRH OU  - FA (09.15.20) shows scattered MA OU  - OCT without diabetic macular edema, OU  - monitor  4. No retinal edema on exam or OCT   5,6. Hypertensive retinopathy OU  - discussed importance of tight BP control  - monitor  7. Mixed form age related cataract OU  - The symptoms of cataract, surgical options, and treatments and risks were discussed with patient.  - discussed diagnosis and progression  - likely visually significant  - clear from a retina standpoint to proceed with cataract surgery when pt and surgeon are ready    Ophthalmic Meds Ordered this visit:  No orders of the defined types were placed in this encounter.      Return in about 2 months (around 07/22/2019) for f/u BRVO OD, DFE, OCT.  There are no Patient Instructions on file for this visit.   Explained the diagnoses, plan, and follow up with the patient and they expressed understanding.  Patient expressed understanding of the importance of proper follow up care.   This document serves as a record of services personally performed by Gardiner Sleeper, MD, PhD. It was created on their behalf by Roselee Nova, COMT. The creation of this record is the provider's dictation and/or activities during the visit.  Electronically signed by: Roselee Nova, COMT 05/26/19 4:39 PM   Gardiner Sleeper, M.D., Ph.D. Diseases & Surgery of the Retina and Vitreous [facility]   I have reviewed the above documentation for accuracy and completeness, and I agree with the above. Gardiner Sleeper, M.D., Ph.D. 05/26/19 4:41 PM     Abbreviations: M myopia (nearsighted); A astigmatism; H hyperopia (farsighted); P presbyopia; Mrx spectacle prescription;  CTL contact  lenses; OD right eye; OS left eye; OU both eyes  XT exotropia; ET esotropia; PEK punctate epithelial keratitis; PEE punctate epithelial erosions; DES dry eye syndrome; MGD meibomian gland dysfunction; ATs artificial tears; PFAT's preservative free artificial tears; [place] nuclear sclerotic cataract; PSC posterior subcapsular cataract; ERM epi-retinal membrane; PVD posterior vitreous detachment; RD retinal detachment; DM diabetes mellitus; DR diabetic retinopathy; NPDR non-proliferative diabetic retinopathy; PDR proliferative diabetic retinopathy; CSME clinically significant macular edema; DME diabetic macular edema; dbh dot blot hemorrhages; CWS cotton wool spot; POAG primary open angle glaucoma; C/D cup-to-disc ratio; HVF humphrey visual field; GVF goldmann visual field; OCT optical coherence tomography; IOP intraocular pressure; BRVO Branch retinal vein occlusion; CRVO central retinal vein occlusion; CRAO central retinal artery occlusion; BRAO branch retinal artery occlusion; RT retinal tear; SB scleral buckle; PPV pars plana vitrectomy; VH Vitreous hemorrhage; PRP panretinal laser photocoagulation; IVK intravitreal kenalog; VMT vitreomacular traction; MH Macular hole;  NVD neovascularization of the disc; NVE neovascularization elsewhere; AREDS age related eye disease study; ARMD age related macular degeneration; POAG primary open angle glaucoma; EBMD epithelial/anterior basement membrane dystrophy; ACIOL anterior chamber intraocular lens; IOL intraocular lens; PCIOL posterior chamber intraocular lens; Phaco/IOL phacoemulsification with intraocular lens placement;  [place] photorefractive keratectomy; LASIK laser assisted in situ keratomileusis; HTN hypertension; DM diabetes mellitus; COPD chronic obstructive pulmonary disease

## 2019-05-22 ENCOUNTER — Ambulatory Visit (INDEPENDENT_AMBULATORY_CARE_PROVIDER_SITE_OTHER): Payer: Medicare Other | Admitting: Ophthalmology

## 2019-05-22 ENCOUNTER — Encounter (INDEPENDENT_AMBULATORY_CARE_PROVIDER_SITE_OTHER): Payer: Self-pay | Admitting: Ophthalmology

## 2019-05-22 ENCOUNTER — Other Ambulatory Visit: Payer: Self-pay

## 2019-05-22 DIAGNOSIS — M6281 Muscle weakness (generalized): Secondary | ICD-10-CM | POA: Diagnosis not present

## 2019-05-22 DIAGNOSIS — E113393 Type 2 diabetes mellitus with moderate nonproliferative diabetic retinopathy without macular edema, bilateral: Secondary | ICD-10-CM | POA: Diagnosis not present

## 2019-05-22 DIAGNOSIS — H348312 Tributary (branch) retinal vein occlusion, right eye, stable: Secondary | ICD-10-CM

## 2019-05-22 DIAGNOSIS — H25813 Combined forms of age-related cataract, bilateral: Secondary | ICD-10-CM

## 2019-05-22 DIAGNOSIS — I10 Essential (primary) hypertension: Secondary | ICD-10-CM

## 2019-05-22 DIAGNOSIS — N184 Chronic kidney disease, stage 4 (severe): Secondary | ICD-10-CM | POA: Diagnosis not present

## 2019-05-22 DIAGNOSIS — H35033 Hypertensive retinopathy, bilateral: Secondary | ICD-10-CM

## 2019-05-22 DIAGNOSIS — H35011 Changes in retinal vascular appearance, right eye: Secondary | ICD-10-CM | POA: Diagnosis not present

## 2019-05-22 DIAGNOSIS — J449 Chronic obstructive pulmonary disease, unspecified: Secondary | ICD-10-CM | POA: Diagnosis not present

## 2019-05-22 DIAGNOSIS — R2689 Other abnormalities of gait and mobility: Secondary | ICD-10-CM | POA: Diagnosis not present

## 2019-05-22 DIAGNOSIS — H3581 Retinal edema: Secondary | ICD-10-CM | POA: Diagnosis not present

## 2019-05-22 DIAGNOSIS — G9341 Metabolic encephalopathy: Secondary | ICD-10-CM | POA: Diagnosis not present

## 2019-05-24 DIAGNOSIS — R2689 Other abnormalities of gait and mobility: Secondary | ICD-10-CM | POA: Diagnosis not present

## 2019-05-24 DIAGNOSIS — G9341 Metabolic encephalopathy: Secondary | ICD-10-CM | POA: Diagnosis not present

## 2019-05-24 DIAGNOSIS — M6281 Muscle weakness (generalized): Secondary | ICD-10-CM | POA: Diagnosis not present

## 2019-05-24 DIAGNOSIS — J449 Chronic obstructive pulmonary disease, unspecified: Secondary | ICD-10-CM | POA: Diagnosis not present

## 2019-05-24 DIAGNOSIS — N184 Chronic kidney disease, stage 4 (severe): Secondary | ICD-10-CM | POA: Diagnosis not present

## 2019-05-25 DIAGNOSIS — G9341 Metabolic encephalopathy: Secondary | ICD-10-CM | POA: Diagnosis not present

## 2019-05-25 DIAGNOSIS — J449 Chronic obstructive pulmonary disease, unspecified: Secondary | ICD-10-CM | POA: Diagnosis not present

## 2019-05-25 DIAGNOSIS — R2689 Other abnormalities of gait and mobility: Secondary | ICD-10-CM | POA: Diagnosis not present

## 2019-05-25 DIAGNOSIS — N184 Chronic kidney disease, stage 4 (severe): Secondary | ICD-10-CM | POA: Diagnosis not present

## 2019-05-25 DIAGNOSIS — M6281 Muscle weakness (generalized): Secondary | ICD-10-CM | POA: Diagnosis not present

## 2019-05-26 ENCOUNTER — Encounter (INDEPENDENT_AMBULATORY_CARE_PROVIDER_SITE_OTHER): Payer: Self-pay | Admitting: Ophthalmology

## 2019-05-26 DIAGNOSIS — J449 Chronic obstructive pulmonary disease, unspecified: Secondary | ICD-10-CM | POA: Diagnosis not present

## 2019-05-26 DIAGNOSIS — R2689 Other abnormalities of gait and mobility: Secondary | ICD-10-CM | POA: Diagnosis not present

## 2019-05-26 DIAGNOSIS — N184 Chronic kidney disease, stage 4 (severe): Secondary | ICD-10-CM | POA: Diagnosis not present

## 2019-05-26 DIAGNOSIS — G9341 Metabolic encephalopathy: Secondary | ICD-10-CM | POA: Diagnosis not present

## 2019-05-26 DIAGNOSIS — M6281 Muscle weakness (generalized): Secondary | ICD-10-CM | POA: Diagnosis not present

## 2019-05-27 DIAGNOSIS — Z79899 Other long term (current) drug therapy: Secondary | ICD-10-CM | POA: Diagnosis not present

## 2019-05-27 DIAGNOSIS — U071 COVID-19: Secondary | ICD-10-CM | POA: Diagnosis not present

## 2019-05-28 DIAGNOSIS — N184 Chronic kidney disease, stage 4 (severe): Secondary | ICD-10-CM | POA: Diagnosis not present

## 2019-05-28 DIAGNOSIS — G9341 Metabolic encephalopathy: Secondary | ICD-10-CM | POA: Diagnosis not present

## 2019-05-28 DIAGNOSIS — J449 Chronic obstructive pulmonary disease, unspecified: Secondary | ICD-10-CM | POA: Diagnosis not present

## 2019-05-28 DIAGNOSIS — M6281 Muscle weakness (generalized): Secondary | ICD-10-CM | POA: Diagnosis not present

## 2019-05-28 DIAGNOSIS — R2689 Other abnormalities of gait and mobility: Secondary | ICD-10-CM | POA: Diagnosis not present

## 2019-05-31 DIAGNOSIS — N184 Chronic kidney disease, stage 4 (severe): Secondary | ICD-10-CM | POA: Diagnosis not present

## 2019-05-31 DIAGNOSIS — G9341 Metabolic encephalopathy: Secondary | ICD-10-CM | POA: Diagnosis not present

## 2019-05-31 DIAGNOSIS — R2689 Other abnormalities of gait and mobility: Secondary | ICD-10-CM | POA: Diagnosis not present

## 2019-05-31 DIAGNOSIS — J449 Chronic obstructive pulmonary disease, unspecified: Secondary | ICD-10-CM | POA: Diagnosis not present

## 2019-05-31 DIAGNOSIS — M6281 Muscle weakness (generalized): Secondary | ICD-10-CM | POA: Diagnosis not present

## 2019-06-01 DIAGNOSIS — N184 Chronic kidney disease, stage 4 (severe): Secondary | ICD-10-CM | POA: Diagnosis not present

## 2019-06-01 DIAGNOSIS — M6281 Muscle weakness (generalized): Secondary | ICD-10-CM | POA: Diagnosis not present

## 2019-06-01 DIAGNOSIS — G9341 Metabolic encephalopathy: Secondary | ICD-10-CM | POA: Diagnosis not present

## 2019-06-01 DIAGNOSIS — J449 Chronic obstructive pulmonary disease, unspecified: Secondary | ICD-10-CM | POA: Diagnosis not present

## 2019-06-01 DIAGNOSIS — R2689 Other abnormalities of gait and mobility: Secondary | ICD-10-CM | POA: Diagnosis not present

## 2019-06-02 DIAGNOSIS — M6281 Muscle weakness (generalized): Secondary | ICD-10-CM | POA: Diagnosis not present

## 2019-06-02 DIAGNOSIS — R2689 Other abnormalities of gait and mobility: Secondary | ICD-10-CM | POA: Diagnosis not present

## 2019-06-02 DIAGNOSIS — J449 Chronic obstructive pulmonary disease, unspecified: Secondary | ICD-10-CM | POA: Diagnosis not present

## 2019-06-02 DIAGNOSIS — N184 Chronic kidney disease, stage 4 (severe): Secondary | ICD-10-CM | POA: Diagnosis not present

## 2019-06-02 DIAGNOSIS — G9341 Metabolic encephalopathy: Secondary | ICD-10-CM | POA: Diagnosis not present

## 2019-06-03 DIAGNOSIS — R2689 Other abnormalities of gait and mobility: Secondary | ICD-10-CM | POA: Diagnosis not present

## 2019-06-03 DIAGNOSIS — M6281 Muscle weakness (generalized): Secondary | ICD-10-CM | POA: Diagnosis not present

## 2019-06-03 DIAGNOSIS — J449 Chronic obstructive pulmonary disease, unspecified: Secondary | ICD-10-CM | POA: Diagnosis not present

## 2019-06-03 DIAGNOSIS — G9341 Metabolic encephalopathy: Secondary | ICD-10-CM | POA: Diagnosis not present

## 2019-06-03 DIAGNOSIS — U071 COVID-19: Secondary | ICD-10-CM | POA: Diagnosis not present

## 2019-06-03 DIAGNOSIS — N184 Chronic kidney disease, stage 4 (severe): Secondary | ICD-10-CM | POA: Diagnosis not present

## 2019-06-04 DIAGNOSIS — M6281 Muscle weakness (generalized): Secondary | ICD-10-CM | POA: Diagnosis not present

## 2019-06-04 DIAGNOSIS — J449 Chronic obstructive pulmonary disease, unspecified: Secondary | ICD-10-CM | POA: Diagnosis not present

## 2019-06-04 DIAGNOSIS — G9341 Metabolic encephalopathy: Secondary | ICD-10-CM | POA: Diagnosis not present

## 2019-06-04 DIAGNOSIS — R2689 Other abnormalities of gait and mobility: Secondary | ICD-10-CM | POA: Diagnosis not present

## 2019-06-04 DIAGNOSIS — N184 Chronic kidney disease, stage 4 (severe): Secondary | ICD-10-CM | POA: Diagnosis not present

## 2019-06-05 DIAGNOSIS — J449 Chronic obstructive pulmonary disease, unspecified: Secondary | ICD-10-CM | POA: Diagnosis not present

## 2019-06-05 DIAGNOSIS — N184 Chronic kidney disease, stage 4 (severe): Secondary | ICD-10-CM | POA: Diagnosis not present

## 2019-06-05 DIAGNOSIS — M6281 Muscle weakness (generalized): Secondary | ICD-10-CM | POA: Diagnosis not present

## 2019-06-05 DIAGNOSIS — R2689 Other abnormalities of gait and mobility: Secondary | ICD-10-CM | POA: Diagnosis not present

## 2019-06-05 DIAGNOSIS — G9341 Metabolic encephalopathy: Secondary | ICD-10-CM | POA: Diagnosis not present

## 2019-06-08 DIAGNOSIS — R2689 Other abnormalities of gait and mobility: Secondary | ICD-10-CM | POA: Diagnosis not present

## 2019-06-08 DIAGNOSIS — N184 Chronic kidney disease, stage 4 (severe): Secondary | ICD-10-CM | POA: Diagnosis not present

## 2019-06-08 DIAGNOSIS — G9341 Metabolic encephalopathy: Secondary | ICD-10-CM | POA: Diagnosis not present

## 2019-06-08 DIAGNOSIS — M6281 Muscle weakness (generalized): Secondary | ICD-10-CM | POA: Diagnosis not present

## 2019-06-08 DIAGNOSIS — J449 Chronic obstructive pulmonary disease, unspecified: Secondary | ICD-10-CM | POA: Diagnosis not present

## 2019-06-09 DIAGNOSIS — J449 Chronic obstructive pulmonary disease, unspecified: Secondary | ICD-10-CM | POA: Diagnosis not present

## 2019-06-09 DIAGNOSIS — M6281 Muscle weakness (generalized): Secondary | ICD-10-CM | POA: Diagnosis not present

## 2019-06-09 DIAGNOSIS — N184 Chronic kidney disease, stage 4 (severe): Secondary | ICD-10-CM | POA: Diagnosis not present

## 2019-06-09 DIAGNOSIS — G9341 Metabolic encephalopathy: Secondary | ICD-10-CM | POA: Diagnosis not present

## 2019-06-09 DIAGNOSIS — R2689 Other abnormalities of gait and mobility: Secondary | ICD-10-CM | POA: Diagnosis not present

## 2019-06-10 DIAGNOSIS — M6281 Muscle weakness (generalized): Secondary | ICD-10-CM | POA: Diagnosis not present

## 2019-06-10 DIAGNOSIS — J449 Chronic obstructive pulmonary disease, unspecified: Secondary | ICD-10-CM | POA: Diagnosis not present

## 2019-06-10 DIAGNOSIS — R2689 Other abnormalities of gait and mobility: Secondary | ICD-10-CM | POA: Diagnosis not present

## 2019-06-10 DIAGNOSIS — G9341 Metabolic encephalopathy: Secondary | ICD-10-CM | POA: Diagnosis not present

## 2019-06-10 DIAGNOSIS — N184 Chronic kidney disease, stage 4 (severe): Secondary | ICD-10-CM | POA: Diagnosis not present

## 2019-06-11 DIAGNOSIS — U071 COVID-19: Secondary | ICD-10-CM | POA: Diagnosis not present

## 2019-06-12 DIAGNOSIS — R2681 Unsteadiness on feet: Secondary | ICD-10-CM | POA: Diagnosis not present

## 2019-06-12 DIAGNOSIS — J449 Chronic obstructive pulmonary disease, unspecified: Secondary | ICD-10-CM | POA: Diagnosis not present

## 2019-06-12 DIAGNOSIS — I480 Paroxysmal atrial fibrillation: Secondary | ICD-10-CM | POA: Diagnosis not present

## 2019-06-12 DIAGNOSIS — N184 Chronic kidney disease, stage 4 (severe): Secondary | ICD-10-CM | POA: Diagnosis not present

## 2019-06-12 DIAGNOSIS — M6281 Muscle weakness (generalized): Secondary | ICD-10-CM | POA: Diagnosis not present

## 2019-06-12 DIAGNOSIS — Z9181 History of falling: Secondary | ICD-10-CM | POA: Diagnosis not present

## 2019-06-12 DIAGNOSIS — R2689 Other abnormalities of gait and mobility: Secondary | ICD-10-CM | POA: Diagnosis not present

## 2019-06-12 DIAGNOSIS — G9341 Metabolic encephalopathy: Secondary | ICD-10-CM | POA: Diagnosis not present

## 2019-06-12 DIAGNOSIS — R278 Other lack of coordination: Secondary | ICD-10-CM | POA: Diagnosis not present

## 2019-06-18 DIAGNOSIS — U071 COVID-19: Secondary | ICD-10-CM | POA: Diagnosis not present

## 2019-06-20 DIAGNOSIS — H25813 Combined forms of age-related cataract, bilateral: Secondary | ICD-10-CM | POA: Diagnosis not present

## 2019-06-20 DIAGNOSIS — E119 Type 2 diabetes mellitus without complications: Secondary | ICD-10-CM | POA: Diagnosis not present

## 2019-06-20 DIAGNOSIS — H401134 Primary open-angle glaucoma, bilateral, indeterminate stage: Secondary | ICD-10-CM | POA: Diagnosis not present

## 2019-07-02 DIAGNOSIS — E118 Type 2 diabetes mellitus with unspecified complications: Secondary | ICD-10-CM | POA: Diagnosis not present

## 2019-07-02 DIAGNOSIS — U071 COVID-19: Secondary | ICD-10-CM | POA: Diagnosis not present

## 2019-07-02 DIAGNOSIS — L304 Erythema intertrigo: Secondary | ICD-10-CM | POA: Diagnosis not present

## 2019-07-08 DIAGNOSIS — Z23 Encounter for immunization: Secondary | ICD-10-CM | POA: Diagnosis not present

## 2019-07-10 DIAGNOSIS — U071 COVID-19: Secondary | ICD-10-CM | POA: Diagnosis not present

## 2019-07-13 DIAGNOSIS — R2689 Other abnormalities of gait and mobility: Secondary | ICD-10-CM | POA: Diagnosis not present

## 2019-07-13 DIAGNOSIS — Z9181 History of falling: Secondary | ICD-10-CM | POA: Diagnosis not present

## 2019-07-13 DIAGNOSIS — J449 Chronic obstructive pulmonary disease, unspecified: Secondary | ICD-10-CM | POA: Diagnosis not present

## 2019-07-13 DIAGNOSIS — R2681 Unsteadiness on feet: Secondary | ICD-10-CM | POA: Diagnosis not present

## 2019-07-13 DIAGNOSIS — G9341 Metabolic encephalopathy: Secondary | ICD-10-CM | POA: Diagnosis not present

## 2019-07-13 DIAGNOSIS — N184 Chronic kidney disease, stage 4 (severe): Secondary | ICD-10-CM | POA: Diagnosis not present

## 2019-07-13 DIAGNOSIS — R278 Other lack of coordination: Secondary | ICD-10-CM | POA: Diagnosis not present

## 2019-07-13 DIAGNOSIS — I480 Paroxysmal atrial fibrillation: Secondary | ICD-10-CM | POA: Diagnosis not present

## 2019-07-13 DIAGNOSIS — M6281 Muscle weakness (generalized): Secondary | ICD-10-CM | POA: Diagnosis not present

## 2019-07-15 NOTE — Progress Notes (Signed)
[facility] Note  07/22/2019     CHIEF COMPLAINT Patient presents for Retina Follow Up   HISTORY OF PRESENT ILLNESS: Reginald [name] is a 84 y.o. male who presents to the clinic today for:   HPI    Retina Follow Up    Patient presents with  CRVO/BRVO.  In right eye.  This started 2 months ago.  Severity is moderate.  I, the attending physician,  performed the HPI with the patient and updated documentation appropriately.          Comments    Patient here for 2 months retina follow up for BRVO OD possible laser OD. Patient states vision doing ok. No eye pain.        Last edited by Bernarda Caffey, MD on 07/22/2019  1:50 PM. (History)    pt feels like his vision is doing well  Referring physician: Demarco, [place], [address] [place],  [place] [address]  HISTORICAL INFORMATION:   Selected notes from the [facility] Referred by Dr. [place] DeMarco for concern of hollenhorst plaque OD LEE: 09.01.20 (J. DeMarco) [BCVA: OD: OS:] Ocular Hx-NPDR OU, PVD OD, CRVO with NV OD, PSC OS, cataracts OU, glaucoma OU PMH-CHF, CKD, COPD, HLD, HTN   CURRENT MEDICATIONS: Current Outpatient Medications (Ophthalmic Drugs)  Medication Sig  . latanoprost (XALATAN) 0.005 % ophthalmic solution Place 1 drop into both eyes at bedtime.   No current facility-administered medications for this visit. (Ophthalmic Drugs)   Current Outpatient Medications (Other)  Medication Sig  . acetaminophen (TYLENOL) 325 MG tablet Take 650 mg by mouth every 4 (four) hours as needed for mild pain.  Marland Kitchen apixaban (ELIQUIS) 2.5 MG TABS tablet Take 1 tablet (2.5 mg total) by mouth 2 (two) times daily.  Marland Kitchen atorvastatin (LIPITOR) 20 MG tablet Take 20 mg by mouth at bedtime.   . cetirizine (ZYRTEC) 10 MG tablet Take 10 mg by mouth at bedtime.  . docusate sodium (COLACE) 100 MG capsule Take 100 mg by mouth at bedtime.  . Dulaglutide (TRULICITY) 5.73 UK/0.2RK SOPN Inject 0.5 mLs into  the skin every Monday.  . escitalopram (LEXAPRO) 5 MG tablet Take 5 mg by mouth daily.  Marland Kitchen gabapentin (NEURONTIN) 100 MG capsule Take 100 mg by mouth 3 (three) times daily.  Marland Kitchen glipiZIDE (GLUCOTROL) 10 MG tablet   . Magnesium 400 MG TABS Take 400 mg by mouth daily.  . Melatonin 5 MG TABS Take 5 mg by mouth at bedtime.  . Menthol, Topical Analgesic, 4.5 % GEL Apply 1 application topically 2 (two) times daily as needed (pain). Apply to forehead and left arm  . metolazone (ZAROXOLYN) 5 MG tablet   . metoprolol succinate (TOPROL-XL) 25 MG 24 hr tablet Take 25 mg by mouth daily.  . Multiple Vitamin (MULTIVITAMIN WITH MINERALS) TABS tablet Take 1 tablet by mouth every evening.  . nitroGLYCERIN (NITROSTAT) 0.4 MG SL tablet Place 0.4 mg under the tongue every 5 (five) minutes as needed for chest pain.  . phenylephrine-shark liver oil-mineral oil-petrolatum (PREPARATION H) 0.25-3-14-71.9 % rectal ointment Place 1 application rectally 3 (three) times daily.  . Potassium Chloride ER 20 MEQ TBCR Take 20 mEq by mouth daily.  . Skin Protectants, Misc. (EUCERIN) cream Apply 1 application topically 2 (two) times a day.  . tamsulosin (FLOMAX) 0.4 MG CAPS capsule Take 1 capsule (0.4 mg total) by mouth daily after supper.  . torsemide (DEMADEX) 20 MG tablet Take 1 tablet (20 mg total)  by mouth daily. Restart on 01/01/2019 after renal function check with BMP and if ok by your PCP/cardiologist.  . vitamin B-12 (CYANOCOBALAMIN) 1000 MCG tablet Take 1,000 mcg by mouth daily.   No current facility-administered medications for this visit. (Other)      REVIEW OF SYSTEMS: ROS    Positive for: Genitourinary, Cardiovascular, Eyes, Respiratory   Negative for: Constitutional, Gastrointestinal, Neurological, Skin, Musculoskeletal, HENT, Endocrine, Psychiatric, Allergic/Imm, Heme/Lymph   Last edited by Theodore Demark, COA on 07/22/2019  1:34 PM. (History)       ALLERGIES Allergies  Allergen Reactions  . Adhesive  [Tape] Rash    PAST MEDICAL HISTORY Past Medical History:  Diagnosis Date  . Anteroseptal myocardial infarction ([place])   . CHF (congestive heart failure) ([place])   . Chronic kidney disease   . COPD (chronic obstructive pulmonary disease) ([place])   . Coronary atherosclerosis of unspecified type of vessel, native or graft    a. s/p prior PCI to LAD in 1997  . Diverticulosis   . Glaucoma   . Hypercholesteremia   . Hypertensive renal disease   . Hypokalemia   . Obesity hypoventilation syndrome ([place])   . Obesity, unspecified   . OSA (obstructive sleep apnea)   . Permanent atrial fibrillation ([place])   . Rhabdomyolysis   . Type II or unspecified type diabetes mellitus without mention of complication, not stated as uncontrolled   . Unspecified essential hypertension   . Vestibulopathy    Past Surgical History:  Procedure Laterality Date  . APPENDECTOMY  1985  . CARDIAC CATHETERIZATION    . CHOLECYSTECTOMY  06/2000  . CORONARY ANGIOPLASTY  08/14/1995   stent placement to LAD   . ENDOVENOUS ABLATION SAPHENOUS VEIN W/ LASER Left 02/21/2018   endovenous laser ablation L GSV by Ruta Hinds MD   . [place] Right 1985  . KNEE ARTHROSCOPY Left 1991  . LUMBAR FUSION    . NOSE SURGERY  1970s   Per Dr. Terrence Dupont PSH in pt chart    FAMILY HISTORY Family History  Problem Relation Age of Onset  . COPD Mother   . Heart disease Mother   . Diabetes Father   . Heart disease Father   . Diabetes Sister   . Heart disease Brother   . Heart disease Brother   . Breast cancer Maternal Aunt   . Diabetes Paternal Grandmother   . Colon cancer Maternal Aunt   . Ovarian cancer Daughter   . Glaucoma Other     SOCIAL HISTORY Social History   Tobacco Use  . Smoking status: Never Smoker  . Smokeless tobacco: Never Used  Substance Use Topics  . Alcohol use: No    Alcohol/week: 0.0 standard drinks  . Drug use: No         OPHTHALMIC EXAM:  Base Eye Exam    Visual Acuity  (Snellen - Linear)      Right Left   Dist cc 20/30 -2 20/40   Dist ph cc NI NI   Correction: Glasses       Tonometry (Tonopen, 1:31 PM)      Right Left   Pressure 20 20       Pupils      Dark Light Shape React APD   Right 4 3 Round Brisk None   Left 4 3 Round Brisk None       Visual Fields (Counting fingers)      Left Right    Full Full  Extraocular Movement      Right Left    Full, Ortho Full, Ortho       Neuro/Psych    Oriented x3: Yes   Mood/Affect: Normal       Dilation    Both eyes: 1.0% Mydriacyl, 2.5% Phenylephrine @ 1:31 PM        Slit Lamp and Fundus Exam    Slit Lamp Exam      Right Left   Lids/Lashes Dermatochalasis - upper lid, Meibomian gland dysfunction Dermatochalasis - upper lid, Meibomian gland dysfunction   Conjunctiva/Sclera White and quiet White and quiet   Cornea Arcus, trace Punctate epithelial erosions Arcus, 1+ inferior Punctate epithelial erosions   Anterior Chamber Deep and quiet, narrow temporal angle Deep and quiet, narrow temporal angle   Iris Round and dilated, No NVI Round and dilated, No NVI   Lens 3+ Nuclear sclerosis with brunescence, 2-3+ Cortical cataract 3+ Nuclear sclerosis with brunescence, 2-3+ Cortical cataract   Vitreous mild Vitreous syneresis, Posterior vitreous detachment mild Vitreous syneresis, Posterior vitreous detachment       Fundus Exam      Right Left   Disc nasal flame hemes, blurred margin, thin superior rim Pink and Sharp   C/D Ratio 0.6 0.55   Macula Flat, Blunted foveal reflex, scattered IRH -- improved Flat, Blunted foveal reflex, mild Retinal pigment epithelial mottling   Vessels Vascular attenuation, dilated and tortuous venules, mild macroaneursym along superior macular arteriole - improving; remote superior BRVO Vascular attenuation, Tortuous   Periphery Attached, peripheral DBH temporally, segmental PRP ST quad - light laser changes temporal periphery Attached, No heme         Refraction     Wearing Rx      Sphere Cylinder Axis Add   Right -0.50 +2.75 178 +2.75   Left -1.00 +3.00 007 +2.75   Type: Bifocal          IMAGING AND PROCEDURES  Imaging and Procedures for @TODAY @  OCT, Retina - OU - Both Eyes       Right Eye Quality was good. Central Foveal Thickness: 255. Progression has been stable. Findings include normal foveal contour, no IRF, no SRF (Mildly irregular lamination; trace ERM).   Left Eye Quality was good. Central Foveal Thickness: 240. Progression has been stable. Findings include normal foveal contour, no IRF, no SRF.   Notes *Images captured and stored on drive  Diagnosis / Impression:  NFP, no IRF/SRF OU OD: Mildly irregular lamination; trace ERM  Clinical management:  See below  Abbreviations: NFP - Normal foveal profile. CME - cystoid macular edema. PED - pigment epithelial detachment. IRF - intraretinal fluid. SRF - subretinal fluid. EZ - ellipsoid zone. ERM - epiretinal membrane. ORA - outer retinal atrophy. ORT - outer retinal tubulation. SRHM - subretinal hyper-reflective material        Panretinal Photocoagulation - OD - Right Eye       LASER PROCEDURE NOTE  Diagnosis:   BRVO with peripheral nonperfusion, RIGHT EYE  Procedure:  Segmental pan-retinal photocoagulation using slit lamp laser, RIGHT EYE, fill in  Anesthesia:  Topical  Surgeon: Bernarda Caffey, MD, PhD   Informed consent obtained, operative eye marked, and time out performed prior to initiation of laser.   Lumenis [id number] slit lamp laser Pattern: 2x2 square Power: 340 mW Duration: 30 msec  Spot size: 200 microns  # spots: 250 spots superotemporal periphery  Complications: None.  RTC: 6 wks  Patient tolerated the procedure well and received written and  verbal post-procedure care information/education.                  ASSESSMENT/PLAN:    ICD-10-CM   1. 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 Retinal macroaneurysm, right eye  H35.011   3. Moderate nonproliferative diabetic retinopathy of both eyes without macular edema associated with type 2 diabetes mellitus ([place])  H88.5027   4. Retinal edema  H35.81 OCT, Retina - OU - Both Eyes  5. Essential hypertension  I10   6. Hypertensive retinopathy of both eyes  H35.033   7. Combined forms of age-related cataract of both eyes  H25.813     1,2. 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 Milus Height at [facility] -- will try to obtain records  - BCVA: OD: 20/50  - OCT shows mildly irregular lamination but no CME or IRF  - FA (09.15.20) shows superior retinal arterial macroaeurysm, telangectatic vessels in superior macular and capillary drop out temporal periphery with late perivascular leakage just proximal to temporal peripheral patch of vascular nonperfusion  - s/p segmental PRP to areas of capillary drop out OD (09.30.20) - mild laser changes temporal periphery  - recommend segmental PRP fill in OD today, 01.11.21  - pt wishes to proceed  - RBA of procedure discussed, questions answered  - informed consent obtained and signed  - see procedure note  - start Lotemax QID OD until bottles runs out -- sample given in office  - F/U 6 weeks  -- DFE/OCT  3. Moderate nonproliferative diabetic retinopathy w/o DME, OU  - exam shows scattered MA and IRH OU  - FA (09.15.20) shows scattered MA OU  - OCT without diabetic macular edema, OU  - monitor  4. No retinal edema on exam or OCT  5,6. Hypertensive retinopathy OU  - discussed importance of tight BP control  - monitor  7. Mixed form age related cataract OU  - The symptoms of cataract, surgical options, and treatments and risks were discussed with patient.  - discussed diagnosis and progression  - likely visually significant  - recommend referral to Dr. Kathlen Mody for cataract consult   Ophthalmic Meds  Ordered this visit:  No orders of the defined types were placed in this encounter.      Return in about 6 weeks (around 09/02/2019) for f/u BRVO OD, DFE, OCT.  There are no Patient Instructions on file for this visit.   Explained the diagnoses, plan, and follow up with the patient and they expressed understanding.  Patient expressed understanding of the importance of proper follow up care.   This document serves as a record of services personally performed by Gardiner Sleeper, MD, PhD. It was created on their behalf by Leeann Must, [place], a certified ophthalmic assistant. The creation of this record is the provider's dictation and/or activities during the visit.    Electronically signed by: Leeann Must, COA @TODAY @ 5:43 PM   This document serves as a record of services personally performed by Gardiner Sleeper, MD, PhD. It was created on their behalf by Ernest Mallick, OA, an ophthalmic assistant. The creation of this record is the provider's dictation and/or activities during the visit.    Electronically signed by: Ernest Mallick, OA 01.11.2021 5:43 PM   Gardiner Sleeper, M.D., Ph.D. Diseases & Surgery of the Retina and [place]  I have reviewed the above documentation for accuracy and completeness, and I agree with the above. Gardiner Sleeper, M.D., Ph.D. 07/22/19 5:43 PM   Abbreviations: M myopia (nearsighted); A astigmatism; H hyperopia (farsighted); P presbyopia; Mrx spectacle prescription;  CTL contact lenses; OD right eye; OS left eye; OU both eyes  XT exotropia; ET esotropia; PEK punctate epithelial keratitis; PEE punctate epithelial erosions; DES dry eye syndrome; MGD meibomian gland dysfunction; ATs artificial tears; PFAT's preservative free artificial tears; [place] nuclear sclerotic cataract; PSC posterior subcapsular cataract; ERM epi-retinal membrane; PVD posterior vitreous detachment; RD retinal detachment; DM diabetes mellitus; DR diabetic  retinopathy; NPDR non-proliferative diabetic retinopathy; PDR proliferative diabetic retinopathy; CSME clinically significant macular edema; DME diabetic macular edema; dbh dot blot hemorrhages; CWS cotton wool spot; POAG primary open angle glaucoma; C/D cup-to-disc ratio; HVF humphrey visual field; GVF goldmann visual field; OCT optical coherence tomography; IOP intraocular pressure; BRVO Branch retinal vein occlusion; CRVO central retinal vein occlusion; CRAO central retinal artery occlusion; BRAO branch retinal artery occlusion; RT retinal tear; SB scleral buckle; PPV pars plana vitrectomy; VH Vitreous hemorrhage; PRP panretinal laser photocoagulation; IVK intravitreal kenalog; VMT vitreomacular traction; MH Macular hole;  NVD neovascularization of the disc; NVE neovascularization elsewhere; AREDS age related eye disease study; ARMD age related macular degeneration; POAG primary open angle glaucoma; EBMD epithelial/anterior basement membrane dystrophy; ACIOL anterior chamber intraocular lens; IOL intraocular lens; PCIOL posterior chamber intraocular lens; Phaco/IOL phacoemulsification with intraocular lens placement; [place] photorefractive keratectomy; LASIK laser assisted in situ keratomileusis; HTN hypertension; DM diabetes mellitus; COPD chronic obstructive pulmonary disease

## 2019-07-17 DIAGNOSIS — U071 COVID-19: Secondary | ICD-10-CM | POA: Diagnosis not present

## 2019-07-18 DIAGNOSIS — N184 Chronic kidney disease, stage 4 (severe): Secondary | ICD-10-CM | POA: Diagnosis not present

## 2019-07-18 DIAGNOSIS — E7849 Other hyperlipidemia: Secondary | ICD-10-CM | POA: Diagnosis not present

## 2019-07-18 DIAGNOSIS — I480 Paroxysmal atrial fibrillation: Secondary | ICD-10-CM | POA: Diagnosis not present

## 2019-07-18 DIAGNOSIS — I5032 Chronic diastolic (congestive) heart failure: Secondary | ICD-10-CM | POA: Diagnosis not present

## 2019-07-18 DIAGNOSIS — E568 Deficiency of other vitamins: Secondary | ICD-10-CM | POA: Diagnosis not present

## 2019-07-18 DIAGNOSIS — E118 Type 2 diabetes mellitus with unspecified complications: Secondary | ICD-10-CM | POA: Diagnosis not present

## 2019-07-18 DIAGNOSIS — I10 Essential (primary) hypertension: Secondary | ICD-10-CM | POA: Diagnosis not present

## 2019-07-18 DIAGNOSIS — J984 Other disorders of lung: Secondary | ICD-10-CM | POA: Diagnosis not present

## 2019-07-22 ENCOUNTER — Encounter (INDEPENDENT_AMBULATORY_CARE_PROVIDER_SITE_OTHER): Payer: Self-pay | Admitting: Ophthalmology

## 2019-07-22 ENCOUNTER — Ambulatory Visit (INDEPENDENT_AMBULATORY_CARE_PROVIDER_SITE_OTHER): Payer: Medicare Other | Admitting: Ophthalmology

## 2019-07-22 ENCOUNTER — Other Ambulatory Visit: Payer: Self-pay

## 2019-07-22 DIAGNOSIS — H35011 Changes in retinal vascular appearance, right eye: Secondary | ICD-10-CM | POA: Diagnosis not present

## 2019-07-22 DIAGNOSIS — I10 Essential (primary) hypertension: Secondary | ICD-10-CM | POA: Diagnosis not present

## 2019-07-22 DIAGNOSIS — H348312 Tributary (branch) retinal vein occlusion, right eye, stable: Secondary | ICD-10-CM

## 2019-07-22 DIAGNOSIS — H3581 Retinal edema: Secondary | ICD-10-CM | POA: Diagnosis not present

## 2019-07-22 DIAGNOSIS — H25813 Combined forms of age-related cataract, bilateral: Secondary | ICD-10-CM | POA: Diagnosis not present

## 2019-07-22 DIAGNOSIS — H35033 Hypertensive retinopathy, bilateral: Secondary | ICD-10-CM | POA: Diagnosis not present

## 2019-07-22 DIAGNOSIS — E113393 Type 2 diabetes mellitus with moderate nonproliferative diabetic retinopathy without macular edema, bilateral: Secondary | ICD-10-CM

## 2019-07-24 DIAGNOSIS — U071 COVID-19: Secondary | ICD-10-CM | POA: Diagnosis not present

## 2019-07-26 DIAGNOSIS — L304 Erythema intertrigo: Secondary | ICD-10-CM | POA: Diagnosis not present

## 2019-07-26 DIAGNOSIS — Z9889 Other specified postprocedural states: Secondary | ICD-10-CM | POA: Diagnosis not present

## 2019-07-30 DIAGNOSIS — E118 Type 2 diabetes mellitus with unspecified complications: Secondary | ICD-10-CM | POA: Diagnosis not present

## 2019-07-30 DIAGNOSIS — Z9889 Other specified postprocedural states: Secondary | ICD-10-CM | POA: Diagnosis not present

## 2019-07-30 DIAGNOSIS — L304 Erythema intertrigo: Secondary | ICD-10-CM | POA: Diagnosis not present

## 2019-08-12 DIAGNOSIS — R278 Other lack of coordination: Secondary | ICD-10-CM | POA: Diagnosis not present

## 2019-08-12 DIAGNOSIS — M6281 Muscle weakness (generalized): Secondary | ICD-10-CM | POA: Diagnosis not present

## 2019-08-12 DIAGNOSIS — Z9181 History of falling: Secondary | ICD-10-CM | POA: Diagnosis not present

## 2019-08-12 DIAGNOSIS — N184 Chronic kidney disease, stage 4 (severe): Secondary | ICD-10-CM | POA: Diagnosis not present

## 2019-08-12 DIAGNOSIS — I480 Paroxysmal atrial fibrillation: Secondary | ICD-10-CM | POA: Diagnosis not present

## 2019-08-12 DIAGNOSIS — R2689 Other abnormalities of gait and mobility: Secondary | ICD-10-CM | POA: Diagnosis not present

## 2019-08-12 DIAGNOSIS — J449 Chronic obstructive pulmonary disease, unspecified: Secondary | ICD-10-CM | POA: Diagnosis not present

## 2019-08-12 DIAGNOSIS — G9341 Metabolic encephalopathy: Secondary | ICD-10-CM | POA: Diagnosis not present

## 2019-08-12 DIAGNOSIS — R2681 Unsteadiness on feet: Secondary | ICD-10-CM | POA: Diagnosis not present

## 2019-08-15 DIAGNOSIS — I5032 Chronic diastolic (congestive) heart failure: Secondary | ICD-10-CM | POA: Diagnosis not present

## 2019-08-15 DIAGNOSIS — R600 Localized edema: Secondary | ICD-10-CM | POA: Diagnosis not present

## 2019-08-15 DIAGNOSIS — N183 Chronic kidney disease, stage 3 unspecified: Secondary | ICD-10-CM | POA: Diagnosis not present

## 2019-08-15 DIAGNOSIS — J984 Other disorders of lung: Secondary | ICD-10-CM | POA: Diagnosis not present

## 2019-08-15 DIAGNOSIS — E118 Type 2 diabetes mellitus with unspecified complications: Secondary | ICD-10-CM | POA: Diagnosis not present

## 2019-08-16 DIAGNOSIS — Z79899 Other long term (current) drug therapy: Secondary | ICD-10-CM | POA: Diagnosis not present

## 2019-08-23 DIAGNOSIS — U071 COVID-19: Secondary | ICD-10-CM | POA: Diagnosis not present

## 2019-08-29 DIAGNOSIS — U071 COVID-19: Secondary | ICD-10-CM | POA: Diagnosis not present

## 2019-09-02 ENCOUNTER — Encounter (INDEPENDENT_AMBULATORY_CARE_PROVIDER_SITE_OTHER): Payer: Medicare Other | Admitting: Ophthalmology

## 2019-09-06 ENCOUNTER — Ambulatory Visit (INDEPENDENT_AMBULATORY_CARE_PROVIDER_SITE_OTHER): Payer: Medicare Other | Admitting: Ophthalmology

## 2019-09-06 ENCOUNTER — Other Ambulatory Visit: Payer: Self-pay

## 2019-09-06 ENCOUNTER — Encounter (INDEPENDENT_AMBULATORY_CARE_PROVIDER_SITE_OTHER): Payer: Self-pay | Admitting: Ophthalmology

## 2019-09-06 DIAGNOSIS — I10 Essential (primary) hypertension: Secondary | ICD-10-CM

## 2019-09-06 DIAGNOSIS — H25813 Combined forms of age-related cataract, bilateral: Secondary | ICD-10-CM

## 2019-09-06 DIAGNOSIS — H35033 Hypertensive retinopathy, bilateral: Secondary | ICD-10-CM

## 2019-09-06 DIAGNOSIS — H348312 Tributary (branch) retinal vein occlusion, right eye, stable: Secondary | ICD-10-CM

## 2019-09-06 DIAGNOSIS — H35011 Changes in retinal vascular appearance, right eye: Secondary | ICD-10-CM | POA: Diagnosis not present

## 2019-09-06 DIAGNOSIS — E113393 Type 2 diabetes mellitus with moderate nonproliferative diabetic retinopathy without macular edema, bilateral: Secondary | ICD-10-CM | POA: Diagnosis not present

## 2019-09-06 DIAGNOSIS — H3581 Retinal edema: Secondary | ICD-10-CM

## 2019-09-06 NOTE — Progress Notes (Addendum)
[facility] Note  09/06/2019     CHIEF COMPLAINT Patient presents for Retina Follow Up   HISTORY OF PRESENT ILLNESS: Reginald [name] is a 84 y.o. male who presents to the clinic today for:   HPI    Retina Follow Up    Patient presents with  CRVO/BRVO.  In right eye.  This started weeks ago.  Severity is moderate.  Duration of weeks.  Since onset it is stable.  I, the attending physician,  performed the HPI with the patient and updated documentation appropriately.          Comments    Pt states vision is about the same OU.  Pt denies eye pain or discomfort and denies any new or worsening floaters or fol OU.       Last edited by Bernarda Caffey, MD on 09/06/2019  5:21 PM. (History)    pt states he had no problems after the laser procedure at last visit, pt states he has an appt with Dr. Gershon Crane for a cataract consult  Referring physician: Guadlupe Spanish, MD [phone] [address] [place],  [place] [address]  HISTORICAL INFORMATION:   Selected notes from the MEDICAL RECORD NUMBER Referred by Dr. Martinique DeMarco for concern of hollenhorst plaque OD LEE: 09.01.20 (J. DeMarco) [BCVA: OD: OS:] Ocular Hx-NPDR OU, PVD OD, CRVO with NV OD, PSC OS, cataracts OU, glaucoma OU PMH-CHF, CKD, COPD, HLD, HTN   CURRENT MEDICATIONS: Current Outpatient Medications (Ophthalmic Drugs)  Medication Sig  . latanoprost (XALATAN) 0.005 % ophthalmic solution Place 1 drop into both eyes at bedtime.   No current facility-administered medications for this visit. (Ophthalmic Drugs)   Current Outpatient Medications (Other)  Medication Sig  . acetaminophen (TYLENOL) 325 MG tablet Take 650 mg by mouth every 4 (four) hours as needed for mild pain.  Marland Kitchen apixaban (ELIQUIS) 2.5 MG TABS tablet Take 1 tablet (2.5 mg total) by mouth 2 (two) times daily.  Marland Kitchen atorvastatin (LIPITOR) 20 MG tablet Take 20 mg by mouth at bedtime.   . cetirizine (ZYRTEC) 10 MG tablet Take 10 mg by mouth at bedtime.  .  docusate sodium (COLACE) 100 MG capsule Take 100 mg by mouth at bedtime.  . Dulaglutide (TRULICITY) 4.13 KG/4.0NU SOPN Inject 0.5 mLs into the skin every Monday.  . escitalopram (LEXAPRO) 5 MG tablet Take 5 mg by mouth daily.  Marland Kitchen gabapentin (NEURONTIN) 100 MG capsule Take 100 mg by mouth 3 (three) times daily.  Marland Kitchen glipiZIDE (GLUCOTROL) 10 MG tablet   . Magnesium 400 MG TABS Take 400 mg by mouth daily.  . Melatonin 5 MG TABS Take 5 mg by mouth at bedtime.  . Menthol, Topical Analgesic, 4.5 % GEL Apply 1 application topically 2 (two) times daily as needed (pain). Apply to forehead and left arm  . metolazone (ZAROXOLYN) 5 MG tablet   . metoprolol succinate (TOPROL-XL) 25 MG 24 hr tablet Take 25 mg by mouth daily.  . Multiple Vitamin (MULTIVITAMIN WITH MINERALS) TABS tablet Take 1 tablet by mouth every evening.  . nitroGLYCERIN (NITROSTAT) 0.4 MG SL tablet Place 0.4 mg under the tongue every 5 (five) minutes as needed for chest pain.  . phenylephrine-shark liver oil-mineral oil-petrolatum (PREPARATION H) 0.25-3-14-71.9 % rectal ointment Place 1 application rectally 3 (three) times daily.  . Potassium Chloride ER 20 MEQ TBCR Take 20 mEq by mouth daily.  . Skin Protectants, Misc. (EUCERIN) cream Apply 1 application topically 2 (two) times a day.  Marland Kitchen  tamsulosin (FLOMAX) 0.4 MG CAPS capsule Take 1 capsule (0.4 mg total) by mouth daily after supper.  . torsemide (DEMADEX) 20 MG tablet Take 1 tablet (20 mg total) by mouth daily. Restart on 01/01/2019 after renal function check with BMP and if ok by your PCP/cardiologist.  . vitamin B-12 (CYANOCOBALAMIN) 1000 MCG tablet Take 1,000 mcg by mouth daily.   No current facility-administered medications for this visit. (Other)      REVIEW OF SYSTEMS: ROS    Positive for: Eyes   Last edited by Bernarda Caffey, MD on 09/06/2019  5:21 PM. (History)       ALLERGIES Allergies  Allergen Reactions  . Adhesive [Tape] Rash    PAST MEDICAL HISTORY Past Medical  History:  Diagnosis Date  . Anteroseptal myocardial infarction ([place])   . CHF (congestive heart failure) ([place])   . Chronic kidney disease   . COPD (chronic obstructive pulmonary disease) ([place])   . Coronary atherosclerosis of unspecified type of vessel, native or graft    a. s/p prior PCI to LAD in 1997  . Diverticulosis   . Glaucoma   . Hypercholesteremia   . Hypertensive renal disease   . Hypokalemia   . Obesity hypoventilation syndrome ([place])   . Obesity, unspecified   . OSA (obstructive sleep apnea)   . Permanent atrial fibrillation ([place])   . Rhabdomyolysis   . Type II or unspecified type diabetes mellitus without mention of complication, not stated as uncontrolled   . Unspecified essential hypertension   . Vestibulopathy    Past Surgical History:  Procedure Laterality Date  . APPENDECTOMY  1985  . CARDIAC CATHETERIZATION    . CHOLECYSTECTOMY  06/2000  . CORONARY ANGIOPLASTY  08/14/1995   stent placement to LAD   . ENDOVENOUS ABLATION SAPHENOUS VEIN W/ LASER Left 02/21/2018   endovenous laser ablation L GSV by Ruta Hinds MD   . [place] Right 1985  . KNEE ARTHROSCOPY Left 1991  . LUMBAR FUSION    . NOSE SURGERY  1970s   Per Dr. Terrence Dupont PSH in pt chart    FAMILY HISTORY Family History  Problem Relation Age of Onset  . COPD Mother   . Heart disease Mother   . Diabetes Father   . Heart disease Father   . Diabetes Sister   . Heart disease Brother   . Heart disease Brother   . Breast cancer Maternal Aunt   . Diabetes Paternal Grandmother   . Colon cancer Maternal Aunt   . Ovarian cancer Daughter   . Glaucoma Other     SOCIAL HISTORY Social History   Tobacco Use  . Smoking status: Never Smoker  . Smokeless tobacco: Never Used  Substance Use Topics  . Alcohol use: No    Alcohol/week: 0.0 standard drinks  . Drug use: No         OPHTHALMIC EXAM:  Base Eye Exam    Visual Acuity (Snellen - Linear)      Right Left   Dist cc 20/60 -2  20/50 -2   Dist ph cc 20/30 -2 20/30 -2   Correction: Glasses       Tonometry (Tonopen, 8:48 AM)      Right Left   Pressure 16 14       Pupils      Dark Light Shape React APD   Right 3 2 Round Brisk 0   Left 3 2 Round Brisk 0       Visual Fields  Left Right    Full Full       Extraocular Movement      Right Left    Full Full       Neuro/Psych    Oriented x3: Yes   Mood/Affect: Normal       Dilation    Both eyes: 1.0% Mydriacyl, 2.5% Phenylephrine @ 8:48 AM        Slit Lamp and Fundus Exam    Slit Lamp Exam      Right Left   Lids/Lashes Dermatochalasis - upper lid, Meibomian gland dysfunction Dermatochalasis - upper lid, Meibomian gland dysfunction   Conjunctiva/Sclera White and quiet White and quiet   Cornea Arcus, trace Punctate epithelial erosions Arcus, 1+ inferior Punctate epithelial erosions   Anterior Chamber Deep and quiet, narrow temporal angle Deep and quiet, narrow temporal angle   Iris Round and dilated, No NVI Round and dilated, No NVI   Lens 3+ Nuclear sclerosis with brunescence, 2-3+ Cortical cataract 3+ Nuclear sclerosis with brunescence, 2-3+ Cortical cataract   Vitreous mild Vitreous syneresis, Posterior vitreous detachment mild Vitreous syneresis, Posterior vitreous detachment       Fundus Exam      Right Left   Disc Pink and Sharp, +disc hemes nasally Pink and Sharp, temporal PPA, +cupping   C/D Ratio 0.6 0.55   Macula Flat, Blunted foveal reflex, scattered IRH -- improved Flat, Blunted foveal reflex, mild Retinal pigment epithelial mottling   Vessels Vascular attenuation, dilated and tortuous venules, mild macroaneursym along superior macular arteriole - improving; remote superior BRVO Vascular attenuation, Tortuous   Periphery Attached, peripheral DBH temporally, segmental PRP ST quad - good laser changes temporal periphery Attached, No heme         Refraction    Wearing Rx      Sphere Cylinder Axis Add   Right -0.50 +2.75 178  +2.75   Left -1.00 +3.00 007 +2.75   Type: Bifocal          IMAGING AND PROCEDURES  Imaging and Procedures for @TODAY @  OCT, Retina - OU - Both Eyes       Right Eye Quality was good. Central Foveal Thickness: 253. Progression has been stable. Findings include normal foveal contour, no IRF, no SRF (Mildly irregular lamination; trace ERM; diffuse retinal thinning).   Left Eye Quality was good. Central Foveal Thickness: 238. Progression has been stable. Findings include normal foveal contour, no IRF, no SRF.   Notes *Images captured and stored on drive  Diagnosis / Impression:  NFP, no IRF/SRF OU OD: Mildly irregular lamination; trace ERM, diffuse retinal thinning  Clinical management:  See below  Abbreviations: NFP - Normal foveal profile. CME - cystoid macular edema. PED - pigment epithelial detachment. IRF - intraretinal fluid. SRF - subretinal fluid. EZ - ellipsoid zone. ERM - epiretinal membrane. ORA - outer retinal atrophy. ORT - outer retinal tubulation. SRHM - subretinal hyper-reflective material                 ASSESSMENT/PLAN:    ICD-10-CM   1. 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 Retinal macroaneurysm, right eye  H35.011   3. Moderate nonproliferative diabetic retinopathy of both eyes without macular edema associated with type 2 diabetes mellitus ([place])  N23.5573   4. Retinal edema  H35.81 OCT, Retina - OU - Both Eyes  5. Essential hypertension  I10   6. Hypertensive retinopathy of both eyes  H35.033   7. Combined forms of  age-related cataract of both eyes  H25.813     1,2. 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 Milus [facility] at [facility] -- will try to obtain records  - BCVA: OD: 20/30 ([place])  - OCT shows mildly irregular lamination but no CME or IRF  - FA (09.15.20) shows superior retinal arterial macroaeurysm, telangectatic vessels in superior macular and  capillary drop out temporal periphery with late perivascular leakage just proximal to temporal peripheral patch of vascular nonperfusion  - s/p segmental PRP to areas of capillary drop out OD (09.30.20), fill-in (01.11.21) -- good fill-in  - completed Lotemax QID OD  - F/U 4 months, sooner prn  -- DFE/OCT  3. Moderate nonproliferative diabetic retinopathy w/o DME, OU  - exam shows scattered MA and IRH OU  - FA (09.15.20) shows scattered MA OU  - OCT without diabetic macular edema, OU  - monitor  4. No retinal edema on exam or OCT  5,6. Hypertensive retinopathy OU  - discussed importance of tight BP control  - monitor  7. Mixed form age related cataract OU  - The symptoms of cataract, surgical options, and treatments and risks were discussed with patient.  - discussed diagnosis and progression  - scheduled for cat consult with Dr. Gershon Crane   Ophthalmic Meds Ordered this visit:  No orders of the defined types were placed in this encounter.      Return in about 4 months (around 01/04/2020) for f/u remote BRVO OD, DFE, OCT.  There are no Patient Instructions on file for this visit.   Explained the diagnoses, plan, and follow up with the patient and they expressed understanding.  Patient expressed understanding of the importance of proper follow up care.   This document serves as a record of services personally performed by Gardiner Sleeper, MD, PhD. It was created on their behalf by Ernest Mallick, OA, an ophthalmic assistant. The creation of this record is the provider's dictation and/or activities during the visit.    Electronically signed by: Ernest Mallick, OA 02.26.2021 5:24 PM   Gardiner Sleeper, M.D., Ph.D. Diseases & Surgery of the Retina and Vitreous [facility]  I have reviewed the above documentation for accuracy and completeness, and I agree with the above. Gardiner Sleeper, M.D., Ph.D. 09/06/19 5:24 PM    Abbreviations: M myopia (nearsighted);  A astigmatism; H hyperopia (farsighted); P presbyopia; Mrx spectacle prescription;  CTL contact lenses; OD right eye; OS left eye; OU both eyes  XT exotropia; ET esotropia; PEK punctate epithelial keratitis; PEE punctate epithelial erosions; DES dry eye syndrome; MGD meibomian gland dysfunction; ATs artificial tears; PFAT's preservative free artificial tears; [place] nuclear sclerotic cataract; PSC posterior subcapsular cataract; ERM epi-retinal membrane; PVD posterior vitreous detachment; RD retinal detachment; DM diabetes mellitus; DR diabetic retinopathy; NPDR non-proliferative diabetic retinopathy; PDR proliferative diabetic retinopathy; CSME clinically significant macular edema; DME diabetic macular edema; dbh dot blot hemorrhages; CWS cotton wool spot; POAG primary open angle glaucoma; C/D cup-to-disc ratio; HVF humphrey visual field; GVF goldmann visual field; OCT optical coherence tomography; IOP intraocular pressure; BRVO Branch retinal vein occlusion; CRVO central retinal vein occlusion; CRAO central retinal artery occlusion; BRAO branch retinal artery occlusion; RT retinal tear; SB scleral buckle; PPV pars plana vitrectomy; VH Vitreous hemorrhage; PRP panretinal laser photocoagulation; IVK intravitreal kenalog; VMT vitreomacular traction; MH Macular hole;  NVD neovascularization of the disc; NVE neovascularization elsewhere; AREDS age related eye disease study; ARMD  age related macular degeneration; POAG primary open angle glaucoma; EBMD epithelial/anterior basement membrane dystrophy; ACIOL anterior chamber intraocular lens; IOL intraocular lens; PCIOL posterior chamber intraocular lens; Phaco/IOL phacoemulsification with intraocular lens placement; [place] photorefractive keratectomy; LASIK laser assisted in situ keratomileusis; HTN hypertension; DM diabetes mellitus; COPD chronic obstructive pulmonary disease

## 2019-09-10 DIAGNOSIS — R2681 Unsteadiness on feet: Secondary | ICD-10-CM | POA: Diagnosis not present

## 2019-09-10 DIAGNOSIS — G9341 Metabolic encephalopathy: Secondary | ICD-10-CM | POA: Diagnosis not present

## 2019-09-10 DIAGNOSIS — J449 Chronic obstructive pulmonary disease, unspecified: Secondary | ICD-10-CM | POA: Diagnosis not present

## 2019-09-10 DIAGNOSIS — M6281 Muscle weakness (generalized): Secondary | ICD-10-CM | POA: Diagnosis not present

## 2019-09-10 DIAGNOSIS — R278 Other lack of coordination: Secondary | ICD-10-CM | POA: Diagnosis not present

## 2019-09-10 DIAGNOSIS — I480 Paroxysmal atrial fibrillation: Secondary | ICD-10-CM | POA: Diagnosis not present

## 2019-09-10 DIAGNOSIS — N184 Chronic kidney disease, stage 4 (severe): Secondary | ICD-10-CM | POA: Diagnosis not present

## 2019-09-10 DIAGNOSIS — R2689 Other abnormalities of gait and mobility: Secondary | ICD-10-CM | POA: Diagnosis not present

## 2019-09-10 DIAGNOSIS — Z9181 History of falling: Secondary | ICD-10-CM | POA: Diagnosis not present

## 2019-09-12 DIAGNOSIS — U071 COVID-19: Secondary | ICD-10-CM | POA: Diagnosis not present

## 2019-09-17 DIAGNOSIS — E118 Type 2 diabetes mellitus with unspecified complications: Secondary | ICD-10-CM | POA: Diagnosis not present

## 2019-09-17 DIAGNOSIS — I480 Paroxysmal atrial fibrillation: Secondary | ICD-10-CM | POA: Diagnosis not present

## 2019-09-17 DIAGNOSIS — I2510 Atherosclerotic heart disease of native coronary artery without angina pectoris: Secondary | ICD-10-CM | POA: Diagnosis not present

## 2019-09-17 DIAGNOSIS — I129 Hypertensive chronic kidney disease with stage 1 through stage 4 chronic kidney disease, or unspecified chronic kidney disease: Secondary | ICD-10-CM | POA: Diagnosis not present

## 2019-09-17 DIAGNOSIS — R4189 Other symptoms and signs involving cognitive functions and awareness: Secondary | ICD-10-CM | POA: Diagnosis not present

## 2019-09-17 DIAGNOSIS — I5032 Chronic diastolic (congestive) heart failure: Secondary | ICD-10-CM | POA: Diagnosis not present

## 2019-09-17 DIAGNOSIS — I7389 Other specified peripheral vascular diseases: Secondary | ICD-10-CM | POA: Diagnosis not present

## 2019-09-17 DIAGNOSIS — Z9189 Other specified personal risk factors, not elsewhere classified: Secondary | ICD-10-CM | POA: Diagnosis not present

## 2019-09-17 DIAGNOSIS — F39 Unspecified mood [affective] disorder: Secondary | ICD-10-CM | POA: Diagnosis not present

## 2019-09-17 DIAGNOSIS — G4733 Obstructive sleep apnea (adult) (pediatric): Secondary | ICD-10-CM | POA: Diagnosis not present

## 2019-09-18 DIAGNOSIS — H401134 Primary open-angle glaucoma, bilateral, indeterminate stage: Secondary | ICD-10-CM | POA: Diagnosis not present

## 2019-09-19 DIAGNOSIS — N184 Chronic kidney disease, stage 4 (severe): Secondary | ICD-10-CM | POA: Diagnosis not present

## 2019-09-19 DIAGNOSIS — R600 Localized edema: Secondary | ICD-10-CM | POA: Diagnosis not present

## 2019-09-19 DIAGNOSIS — I129 Hypertensive chronic kidney disease with stage 1 through stage 4 chronic kidney disease, or unspecified chronic kidney disease: Secondary | ICD-10-CM | POA: Diagnosis not present

## 2019-09-19 DIAGNOSIS — L570 Actinic keratosis: Secondary | ICD-10-CM | POA: Diagnosis not present

## 2019-09-20 DIAGNOSIS — U071 COVID-19: Secondary | ICD-10-CM | POA: Diagnosis not present

## 2019-10-07 DIAGNOSIS — R7989 Other specified abnormal findings of blood chemistry: Secondary | ICD-10-CM | POA: Diagnosis not present

## 2019-10-10 DIAGNOSIS — R2689 Other abnormalities of gait and mobility: Secondary | ICD-10-CM | POA: Diagnosis not present

## 2019-10-10 DIAGNOSIS — N183 Chronic kidney disease, stage 3 unspecified: Secondary | ICD-10-CM | POA: Diagnosis not present

## 2019-10-10 DIAGNOSIS — I480 Paroxysmal atrial fibrillation: Secondary | ICD-10-CM | POA: Diagnosis not present

## 2019-10-10 DIAGNOSIS — G9341 Metabolic encephalopathy: Secondary | ICD-10-CM | POA: Diagnosis not present

## 2019-10-10 DIAGNOSIS — E118 Type 2 diabetes mellitus with unspecified complications: Secondary | ICD-10-CM | POA: Diagnosis not present

## 2019-10-10 DIAGNOSIS — Z9181 History of falling: Secondary | ICD-10-CM | POA: Diagnosis not present

## 2019-10-10 DIAGNOSIS — N184 Chronic kidney disease, stage 4 (severe): Secondary | ICD-10-CM | POA: Diagnosis not present

## 2019-10-10 DIAGNOSIS — R2681 Unsteadiness on feet: Secondary | ICD-10-CM | POA: Diagnosis not present

## 2019-10-10 DIAGNOSIS — R278 Other lack of coordination: Secondary | ICD-10-CM | POA: Diagnosis not present

## 2019-10-10 DIAGNOSIS — J449 Chronic obstructive pulmonary disease, unspecified: Secondary | ICD-10-CM | POA: Diagnosis not present

## 2019-10-10 DIAGNOSIS — I5031 Acute diastolic (congestive) heart failure: Secondary | ICD-10-CM | POA: Diagnosis not present

## 2019-10-10 DIAGNOSIS — Z9111 Patient's noncompliance with dietary regimen: Secondary | ICD-10-CM | POA: Diagnosis not present

## 2019-10-10 DIAGNOSIS — M6281 Muscle weakness (generalized): Secondary | ICD-10-CM | POA: Diagnosis not present

## 2019-10-24 DIAGNOSIS — L304 Erythema intertrigo: Secondary | ICD-10-CM | POA: Diagnosis not present

## 2019-10-24 DIAGNOSIS — Z9111 Patient's noncompliance with dietary regimen: Secondary | ICD-10-CM | POA: Diagnosis not present

## 2019-10-24 DIAGNOSIS — I5031 Acute diastolic (congestive) heart failure: Secondary | ICD-10-CM | POA: Diagnosis not present

## 2019-10-24 DIAGNOSIS — L570 Actinic keratosis: Secondary | ICD-10-CM | POA: Diagnosis not present

## 2019-10-28 DIAGNOSIS — U071 COVID-19: Secondary | ICD-10-CM | POA: Diagnosis not present

## 2019-11-04 DIAGNOSIS — U071 COVID-19: Secondary | ICD-10-CM | POA: Diagnosis not present

## 2019-11-13 DIAGNOSIS — I5030 Unspecified diastolic (congestive) heart failure: Secondary | ICD-10-CM | POA: Diagnosis not present

## 2019-11-13 DIAGNOSIS — R7989 Other specified abnormal findings of blood chemistry: Secondary | ICD-10-CM | POA: Diagnosis not present

## 2019-11-13 DIAGNOSIS — E119 Type 2 diabetes mellitus without complications: Secondary | ICD-10-CM | POA: Diagnosis not present

## 2019-11-19 DIAGNOSIS — E568 Deficiency of other vitamins: Secondary | ICD-10-CM | POA: Diagnosis not present

## 2019-11-19 DIAGNOSIS — R600 Localized edema: Secondary | ICD-10-CM | POA: Diagnosis not present

## 2019-11-19 DIAGNOSIS — I5032 Chronic diastolic (congestive) heart failure: Secondary | ICD-10-CM | POA: Diagnosis not present

## 2019-11-19 DIAGNOSIS — I480 Paroxysmal atrial fibrillation: Secondary | ICD-10-CM | POA: Diagnosis not present

## 2019-11-19 DIAGNOSIS — E119 Type 2 diabetes mellitus without complications: Secondary | ICD-10-CM | POA: Diagnosis not present

## 2019-11-19 DIAGNOSIS — L570 Actinic keratosis: Secondary | ICD-10-CM | POA: Diagnosis not present

## 2019-11-19 DIAGNOSIS — E7849 Other hyperlipidemia: Secondary | ICD-10-CM | POA: Diagnosis not present

## 2019-11-19 DIAGNOSIS — E538 Deficiency of other specified B group vitamins: Secondary | ICD-10-CM | POA: Diagnosis not present

## 2019-11-19 DIAGNOSIS — I10 Essential (primary) hypertension: Secondary | ICD-10-CM | POA: Diagnosis not present

## 2019-11-19 DIAGNOSIS — N184 Chronic kidney disease, stage 4 (severe): Secondary | ICD-10-CM | POA: Diagnosis not present

## 2019-11-20 DIAGNOSIS — E119 Type 2 diabetes mellitus without complications: Secondary | ICD-10-CM | POA: Diagnosis not present

## 2019-11-20 DIAGNOSIS — I5030 Unspecified diastolic (congestive) heart failure: Secondary | ICD-10-CM | POA: Diagnosis not present

## 2019-11-20 DIAGNOSIS — R7989 Other specified abnormal findings of blood chemistry: Secondary | ICD-10-CM | POA: Diagnosis not present

## 2019-11-20 DIAGNOSIS — E559 Vitamin D deficiency, unspecified: Secondary | ICD-10-CM | POA: Diagnosis not present

## 2019-11-28 DIAGNOSIS — E559 Vitamin D deficiency, unspecified: Secondary | ICD-10-CM | POA: Diagnosis not present

## 2019-11-28 DIAGNOSIS — E785 Hyperlipidemia, unspecified: Secondary | ICD-10-CM | POA: Diagnosis not present

## 2019-11-28 DIAGNOSIS — E538 Deficiency of other specified B group vitamins: Secondary | ICD-10-CM | POA: Diagnosis not present

## 2019-12-03 DIAGNOSIS — R1319 Other dysphagia: Secondary | ICD-10-CM | POA: Diagnosis not present

## 2019-12-03 DIAGNOSIS — R600 Localized edema: Secondary | ICD-10-CM | POA: Diagnosis not present

## 2019-12-03 DIAGNOSIS — H9193 Unspecified hearing loss, bilateral: Secondary | ICD-10-CM | POA: Diagnosis not present

## 2019-12-03 DIAGNOSIS — S91209A Unspecified open wound of unspecified toe(s) with damage to nail, initial encounter: Secondary | ICD-10-CM | POA: Diagnosis not present

## 2019-12-04 DIAGNOSIS — N184 Chronic kidney disease, stage 4 (severe): Secondary | ICD-10-CM | POA: Diagnosis not present

## 2019-12-04 DIAGNOSIS — G9341 Metabolic encephalopathy: Secondary | ICD-10-CM | POA: Diagnosis not present

## 2019-12-04 DIAGNOSIS — R2689 Other abnormalities of gait and mobility: Secondary | ICD-10-CM | POA: Diagnosis not present

## 2019-12-04 DIAGNOSIS — R278 Other lack of coordination: Secondary | ICD-10-CM | POA: Diagnosis not present

## 2019-12-04 DIAGNOSIS — J449 Chronic obstructive pulmonary disease, unspecified: Secondary | ICD-10-CM | POA: Diagnosis not present

## 2019-12-04 DIAGNOSIS — I480 Paroxysmal atrial fibrillation: Secondary | ICD-10-CM | POA: Diagnosis not present

## 2019-12-04 DIAGNOSIS — M6281 Muscle weakness (generalized): Secondary | ICD-10-CM | POA: Diagnosis not present

## 2019-12-04 DIAGNOSIS — R2681 Unsteadiness on feet: Secondary | ICD-10-CM | POA: Diagnosis not present

## 2019-12-04 DIAGNOSIS — Z9181 History of falling: Secondary | ICD-10-CM | POA: Diagnosis not present

## 2019-12-04 DIAGNOSIS — R1312 Dysphagia, oropharyngeal phase: Secondary | ICD-10-CM | POA: Diagnosis not present

## 2019-12-10 DIAGNOSIS — R2681 Unsteadiness on feet: Secondary | ICD-10-CM | POA: Diagnosis not present

## 2019-12-10 DIAGNOSIS — J449 Chronic obstructive pulmonary disease, unspecified: Secondary | ICD-10-CM | POA: Diagnosis not present

## 2019-12-10 DIAGNOSIS — M6281 Muscle weakness (generalized): Secondary | ICD-10-CM | POA: Diagnosis not present

## 2019-12-10 DIAGNOSIS — Z9181 History of falling: Secondary | ICD-10-CM | POA: Diagnosis not present

## 2019-12-10 DIAGNOSIS — G9341 Metabolic encephalopathy: Secondary | ICD-10-CM | POA: Diagnosis not present

## 2019-12-10 DIAGNOSIS — R2689 Other abnormalities of gait and mobility: Secondary | ICD-10-CM | POA: Diagnosis not present

## 2019-12-10 DIAGNOSIS — M6259 Muscle wasting and atrophy, not elsewhere classified, multiple sites: Secondary | ICD-10-CM | POA: Diagnosis not present

## 2019-12-10 DIAGNOSIS — R278 Other lack of coordination: Secondary | ICD-10-CM | POA: Diagnosis not present

## 2019-12-10 DIAGNOSIS — I480 Paroxysmal atrial fibrillation: Secondary | ICD-10-CM | POA: Diagnosis not present

## 2019-12-10 DIAGNOSIS — N184 Chronic kidney disease, stage 4 (severe): Secondary | ICD-10-CM | POA: Diagnosis not present

## 2019-12-10 DIAGNOSIS — R1312 Dysphagia, oropharyngeal phase: Secondary | ICD-10-CM | POA: Diagnosis not present

## 2019-12-19 DIAGNOSIS — H401134 Primary open-angle glaucoma, bilateral, indeterminate stage: Secondary | ICD-10-CM | POA: Diagnosis not present

## 2019-12-19 DIAGNOSIS — H25813 Combined forms of age-related cataract, bilateral: Secondary | ICD-10-CM | POA: Diagnosis not present

## 2020-01-06 ENCOUNTER — Encounter (INDEPENDENT_AMBULATORY_CARE_PROVIDER_SITE_OTHER): Payer: Medicare Other | Admitting: Ophthalmology

## 2020-01-08 DIAGNOSIS — C4442 Squamous cell carcinoma of skin of scalp and neck: Secondary | ICD-10-CM | POA: Diagnosis not present

## 2020-01-08 DIAGNOSIS — D044 Carcinoma in situ of skin of scalp and neck: Secondary | ICD-10-CM | POA: Diagnosis not present

## 2020-01-08 DIAGNOSIS — L821 Other seborrheic keratosis: Secondary | ICD-10-CM | POA: Diagnosis not present

## 2020-01-08 DIAGNOSIS — L814 Other melanin hyperpigmentation: Secondary | ICD-10-CM | POA: Diagnosis not present

## 2020-01-08 DIAGNOSIS — D485 Neoplasm of uncertain behavior of skin: Secondary | ICD-10-CM | POA: Diagnosis not present

## 2020-01-08 DIAGNOSIS — C44319 Basal cell carcinoma of skin of other parts of face: Secondary | ICD-10-CM | POA: Diagnosis not present

## 2020-01-09 DIAGNOSIS — Z9181 History of falling: Secondary | ICD-10-CM | POA: Diagnosis not present

## 2020-01-09 DIAGNOSIS — M6259 Muscle wasting and atrophy, not elsewhere classified, multiple sites: Secondary | ICD-10-CM | POA: Diagnosis not present

## 2020-01-09 DIAGNOSIS — R2681 Unsteadiness on feet: Secondary | ICD-10-CM | POA: Diagnosis not present

## 2020-01-09 DIAGNOSIS — R2689 Other abnormalities of gait and mobility: Secondary | ICD-10-CM | POA: Diagnosis not present

## 2020-01-09 DIAGNOSIS — R1312 Dysphagia, oropharyngeal phase: Secondary | ICD-10-CM | POA: Diagnosis not present

## 2020-01-09 DIAGNOSIS — M6281 Muscle weakness (generalized): Secondary | ICD-10-CM | POA: Diagnosis not present

## 2020-01-09 DIAGNOSIS — R278 Other lack of coordination: Secondary | ICD-10-CM | POA: Diagnosis not present

## 2020-01-09 DIAGNOSIS — N184 Chronic kidney disease, stage 4 (severe): Secondary | ICD-10-CM | POA: Diagnosis not present

## 2020-01-09 DIAGNOSIS — J449 Chronic obstructive pulmonary disease, unspecified: Secondary | ICD-10-CM | POA: Diagnosis not present

## 2020-01-09 DIAGNOSIS — G9341 Metabolic encephalopathy: Secondary | ICD-10-CM | POA: Diagnosis not present

## 2020-01-09 DIAGNOSIS — I480 Paroxysmal atrial fibrillation: Secondary | ICD-10-CM | POA: Diagnosis not present

## 2020-01-14 DIAGNOSIS — I480 Paroxysmal atrial fibrillation: Secondary | ICD-10-CM | POA: Diagnosis not present

## 2020-01-14 DIAGNOSIS — Z794 Long term (current) use of insulin: Secondary | ICD-10-CM | POA: Diagnosis not present

## 2020-01-14 DIAGNOSIS — E1149 Type 2 diabetes mellitus with other diabetic neurological complication: Secondary | ICD-10-CM | POA: Diagnosis not present

## 2020-01-14 DIAGNOSIS — M79674 Pain in right toe(s): Secondary | ICD-10-CM | POA: Diagnosis not present

## 2020-01-14 DIAGNOSIS — L603 Nail dystrophy: Secondary | ICD-10-CM | POA: Diagnosis not present

## 2020-01-14 DIAGNOSIS — M79675 Pain in left toe(s): Secondary | ICD-10-CM | POA: Diagnosis not present

## 2020-01-14 DIAGNOSIS — B351 Tinea unguium: Secondary | ICD-10-CM | POA: Diagnosis not present

## 2020-01-14 DIAGNOSIS — N183 Chronic kidney disease, stage 3 unspecified: Secondary | ICD-10-CM | POA: Diagnosis not present

## 2020-01-14 DIAGNOSIS — G4733 Obstructive sleep apnea (adult) (pediatric): Secondary | ICD-10-CM | POA: Diagnosis not present

## 2020-01-14 DIAGNOSIS — I2510 Atherosclerotic heart disease of native coronary artery without angina pectoris: Secondary | ICD-10-CM | POA: Diagnosis not present

## 2020-01-14 DIAGNOSIS — E1140 Type 2 diabetes mellitus with diabetic neuropathy, unspecified: Secondary | ICD-10-CM | POA: Diagnosis not present

## 2020-01-14 DIAGNOSIS — D229 Melanocytic nevi, unspecified: Secondary | ICD-10-CM | POA: Diagnosis not present

## 2020-01-14 DIAGNOSIS — I7389 Other specified peripheral vascular diseases: Secondary | ICD-10-CM | POA: Diagnosis not present

## 2020-01-14 DIAGNOSIS — I5032 Chronic diastolic (congestive) heart failure: Secondary | ICD-10-CM | POA: Diagnosis not present

## 2020-01-14 DIAGNOSIS — E568 Deficiency of other vitamins: Secondary | ICD-10-CM | POA: Diagnosis not present

## 2020-01-14 DIAGNOSIS — R262 Difficulty in walking, not elsewhere classified: Secondary | ICD-10-CM | POA: Diagnosis not present

## 2020-01-14 DIAGNOSIS — M2041 Other hammer toe(s) (acquired), right foot: Secondary | ICD-10-CM | POA: Diagnosis not present

## 2020-01-14 DIAGNOSIS — R4189 Other symptoms and signs involving cognitive functions and awareness: Secondary | ICD-10-CM | POA: Diagnosis not present

## 2020-01-23 DIAGNOSIS — Z13220 Encounter for screening for lipoid disorders: Secondary | ICD-10-CM | POA: Diagnosis not present

## 2020-01-23 DIAGNOSIS — E559 Vitamin D deficiency, unspecified: Secondary | ICD-10-CM | POA: Diagnosis not present

## 2020-01-23 DIAGNOSIS — E785 Hyperlipidemia, unspecified: Secondary | ICD-10-CM | POA: Diagnosis not present

## 2020-01-23 NOTE — Progress Notes (Signed)
[facility] Note  01/27/2020     CHIEF COMPLAINT Patient presents for Retina Follow Up   HISTORY OF PRESENT ILLNESS: Reginald [name] is a 84 y.o. male who presents to the clinic today for:   HPI    Retina Follow Up    Patient presents with  CRVO/BRVO.  In right eye.  Severity is moderate.  Duration of 5 months.  I, the attending physician,  performed the HPI with the patient and updated documentation appropriately.          Comments    Patient states vision seems the same OU. Using latanoprost qhs OU. Hasn't checked BS today. Doesn't remember last a1c.        Last edited by Bernarda Caffey, MD on 01/27/2020 12:26 PM. (History)       Referring physician: Guadlupe Spanish, MD [phone] [place],  [place] [address]  HISTORICAL INFORMATION:   Selected notes from the MEDICAL RECORD NUMBER Referred by Dr. Martinique DeMarco for concern of hollenhorst plaque OD LEE: 09.01.20 (J. DeMarco) [BCVA: OD: OS:] Ocular Hx-NPDR OU, PVD OD, CRVO with NV OD, PSC OS, cataracts OU, glaucoma OU PMH-CHF, CKD, COPD, HLD, HTN   CURRENT MEDICATIONS: Current Outpatient Medications (Ophthalmic Drugs)  Medication Sig  . latanoprost (XALATAN) 0.005 % ophthalmic solution Place 1 drop into both eyes at bedtime.   No current facility-administered medications for this visit. (Ophthalmic Drugs)   Current Outpatient Medications (Other)  Medication Sig  . acetaminophen (TYLENOL) 325 MG tablet Take 650 mg by mouth every 4 (four) hours as needed for mild pain.  Marland Kitchen apixaban (ELIQUIS) 2.5 MG TABS tablet Take 1 tablet (2.5 mg total) by mouth 2 (two) times daily.  Marland Kitchen atorvastatin (LIPITOR) 20 MG tablet Take 20 mg by mouth at bedtime.   . cetirizine (ZYRTEC) 10 MG tablet Take 10 mg by mouth at bedtime.  . docusate sodium (COLACE) 100 MG capsule Take 100 mg by mouth at bedtime.  . Dulaglutide (TRULICITY) 5.09 TO/6.7TI SOPN Inject 0.5 mLs into the skin every Monday.  . escitalopram  (LEXAPRO) 5 MG tablet Take 5 mg by mouth daily.  Marland Kitchen gabapentin (NEURONTIN) 100 MG capsule Take 100 mg by mouth 3 (three) times daily.  Marland Kitchen glipiZIDE (GLUCOTROL) 10 MG tablet   . Magnesium 400 MG TABS Take 400 mg by mouth daily.  . Melatonin 5 MG TABS Take 5 mg by mouth at bedtime.  . Menthol, Topical Analgesic, 4.5 % GEL Apply 1 application topically 2 (two) times daily as needed (pain). Apply to forehead and left arm  . metolazone (ZAROXOLYN) 5 MG tablet   . metoprolol succinate (TOPROL-XL) 25 MG 24 hr tablet Take 25 mg by mouth daily.  . Multiple Vitamin (MULTIVITAMIN WITH MINERALS) TABS tablet Take 1 tablet by mouth every evening.  . nitroGLYCERIN (NITROSTAT) 0.4 MG SL tablet Place 0.4 mg under the tongue every 5 (five) minutes as needed for chest pain.  . phenylephrine-shark liver oil-mineral oil-petrolatum (PREPARATION H) 0.25-3-14-71.9 % rectal ointment Place 1 application rectally 3 (three) times daily.  . Potassium Chloride ER 20 MEQ TBCR Take 20 mEq by mouth daily.  . Skin Protectants, Misc. (EUCERIN) cream Apply 1 application topically 2 (two) times a day.  . tamsulosin (FLOMAX) 0.4 MG CAPS capsule Take 1 capsule (0.4 mg total) by mouth daily after supper.  . torsemide (DEMADEX) 20 MG tablet Take 1 tablet (20 mg total) by mouth daily. Restart on 01/01/2019 after renal function  check with BMP and if ok by your PCP/cardiologist.  . vitamin B-12 (CYANOCOBALAMIN) 1000 MCG tablet Take 1,000 mcg by mouth daily.   No current facility-administered medications for this visit. (Other)      REVIEW OF SYSTEMS: ROS    Positive for: Genitourinary, Cardiovascular, Eyes, Respiratory   Negative for: Constitutional, Gastrointestinal, Neurological, Skin, Musculoskeletal, HENT, Endocrine, Psychiatric, Allergic/Imm, Heme/Lymph   Last edited by Roselee Nova D, COT on 01/27/2020  9:17 AM. (History)       ALLERGIES Allergies  Allergen Reactions  . Adhesive [Tape] Rash    PAST MEDICAL HISTORY Past  Medical History:  Diagnosis Date  . Anteroseptal myocardial infarction ([place])   . CHF (congestive heart failure) ([place])   . Chronic kidney disease   . COPD (chronic obstructive pulmonary disease) ([place])   . Coronary atherosclerosis of unspecified type of vessel, native or graft    a. s/p prior PCI to LAD in 1997  . Diverticulosis   . Glaucoma   . Hypercholesteremia   . Hypertensive renal disease   . Hypokalemia   . Obesity hypoventilation syndrome ([place])   . Obesity, unspecified   . OSA (obstructive sleep apnea)   . Permanent atrial fibrillation ([place])   . Rhabdomyolysis   . Type II or unspecified type diabetes mellitus without mention of complication, not stated as uncontrolled   . Unspecified essential hypertension   . Vestibulopathy    Past Surgical History:  Procedure Laterality Date  . APPENDECTOMY  1985  . CARDIAC CATHETERIZATION    . CHOLECYSTECTOMY  06/2000  . CORONARY ANGIOPLASTY  08/14/1995   stent placement to LAD   . ENDOVENOUS ABLATION SAPHENOUS VEIN W/ LASER Left 02/21/2018   endovenous laser ablation L GSV by Ruta Hinds MD   . [place] Right 1985  . KNEE ARTHROSCOPY Left 1991  . LUMBAR FUSION    . NOSE SURGERY  1970s   Per Dr. Terrence Dupont [facility] in pt chart    FAMILY HISTORY Family History  Problem Relation Age of Onset  . COPD Mother   . Heart disease Mother   . Diabetes Father   . Heart disease Father   . Diabetes Sister   . Heart disease Brother   . Heart disease Brother   . Breast cancer Maternal Aunt   . Diabetes Paternal Grandmother   . Colon cancer Maternal Aunt   . Ovarian cancer Daughter   . Glaucoma Other     SOCIAL HISTORY Social History   Tobacco Use  . Smoking status: Never Smoker  . Smokeless tobacco: Never Used  Substance Use Topics  . Alcohol use: No    Alcohol/week: 0.0 standard drinks  . Drug use: No         OPHTHALMIC EXAM:  Base Eye Exam    Visual Acuity (Snellen - Linear)      Right Left   Dist cc  20/40 +1 20/60 -2   Dist ph cc NI 20/40   Correction: Glasses       Tonometry (Tonopen, 9:36 AM)      Right Left   Pressure 19 19       Pupils      Dark Light Shape React APD   Right 3 2 Round Brisk None   Left 3 2 Round Brisk None       Visual Fields (Counting fingers)      Left Right    Full Full       Extraocular Movement  Right Left    Full, Ortho Full, Ortho       Neuro/Psych    Oriented x3: Yes   Mood/Affect: Normal       Dilation    Both eyes: 1.0% Mydriacyl, 2.5% Phenylephrine @ 9:36 AM        Slit Lamp and Fundus Exam    Slit Lamp Exam      Right Left   Lids/Lashes Dermatochalasis - upper lid, Meibomian gland dysfunction Dermatochalasis - upper lid, Meibomian gland dysfunction   Conjunctiva/Sclera White and quiet White and quiet   Cornea Arcus, 1-2+ Punctate epithelial erosions Arcus, 1+ inferior Punctate epithelial erosions   Anterior Chamber Deep and quiet, narrow temporal angle Deep and quiet, narrow temporal angle   Iris Round and dilated, No NVI Round and dilated, No NVI   Lens 3+ Nuclear sclerosis with brunescence, 2-3+ Cortical cataract 3+ Nuclear sclerosis with brunescence, 2-3+ Cortical cataract   Vitreous mild Vitreous syneresis, Posterior vitreous detachment, weiss ring mild Vitreous syneresis, Posterior vitreous detachment       Fundus Exam      Right Left   Disc Pink and Sharp, +disc hemes nasally--persistent Pink and Sharp, temporal PPA, +cupping, focal disc heme @ 1:00   C/D Ratio 0.6 0.55   Macula Flat, Blunted foveal reflex, no heme or edema Flat, Blunted foveal reflex, mild Retinal pigment epithelial mottling, focal IRH temporal to fovea   Vessels Vascular attenuation, av crossing changes, dilated and tortuous venules, mild macroaneursym along superior macular arteriole - improving; remote superior BRVO Vascular attenuation, Tortuous, av crossing changes   Periphery Attached, peripheral DBH superiorly, segmental PRP ST quad - good  laser changes temporal periphery Attached, No heme         Refraction    Wearing Rx      Sphere Cylinder Axis Add   Right -0.50 +2.75 178 +2.75   Left -1.00 +3.00 007 +2.75   Type: Bifocal       Manifest Refraction      Sphere Cylinder Axis Dist VA   Right -0.75 +3.25 177 20/30-2   Left -1.50 +3.00 007 20/50+2          IMAGING AND PROCEDURES  Imaging and Procedures for @TODAY @  OCT, Retina - OU - Both Eyes       Right Eye Quality was good. Central Foveal Thickness: 252. Progression has been stable. Findings include normal foveal contour, no IRF, no SRF (Mildly irregular lamination; trace ERM; diffuse retinal thinning).   Left Eye Quality was good. Central Foveal Thickness: 237. Progression has been stable. Findings include normal foveal contour, no IRF, no SRF.   Notes *Images captured and stored on drive  Diagnosis / Impression:  NFP, no IRF/SRF OU OD: Mildly irregular lamination; trace ERM, diffuse retinal thinning  Clinical management:  See below  Abbreviations: NFP - Normal foveal profile. CME - cystoid macular edema. PED - pigment epithelial detachment. IRF - intraretinal fluid. SRF - subretinal fluid. EZ - ellipsoid zone. ERM - epiretinal membrane. ORA - outer retinal atrophy. ORT - outer retinal tubulation. SRHM - subretinal hyper-reflective material                 ASSESSMENT/PLAN:    ICD-10-CM   1. 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 Retinal macroaneurysm, right eye  H35.011   3. Moderate nonproliferative diabetic retinopathy of both eyes without macular edema associated with type 2 diabetes mellitus ([place])  D98.3382   4. Retinal  edema  H35.81 OCT, Retina - OU - Both Eyes  5. Essential hypertension  I10   6. Hypertensive retinopathy of both eyes  H35.033   7. Combined forms of age-related cataract of both eyes  H25.813     1,2. 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 Milus Height at [facility] -- will try to obtain records  - BCVA: OD: 20/40  - OCT shows mildly irregular lamination but no CME or IRF  - FA (09.15.20) shows superior retinal arterial macroaeurysm, telangectatic vessels in superior macula and capillary drop out temporal periphery with late perivascular leakage just proximal to temporal peripheral patch of vascular nonperfusion  - s/p segmental PRP to areas of capillary drop out OD (09.30.20), fill-in (01.11.21) -- good fill-in  - F/U 4 months, sooner prn  -- DFE/OCT/Optos FA-Transit OD  3. Moderate nonproliferative diabetic retinopathy w/o DME, OU  - exam shows scattered MA and IRH OU  - FA (09.15.20) shows scattered MA OU  - OCT without diabetic macular edema, OU  - monitor  4. No retinal edema on exam or OCT  5,6. Hypertensive retinopathy OU  - discussed importance of tight BP control  - monitor  7. Mixed form age related cataract OU  - The symptoms of cataract, surgical options, and treatments and risks were discussed with patient.  - discussed diagnosis and progression  - scheduled for cat consult with Dr. Gershon Crane; sx not scheduled at the time due to Covd-19 restrictions.  Next appt 03/2020.   Ophthalmic Meds Ordered this visit:  No orders of the defined types were placed in this encounter.      Return in about 4 months (around 05/29/2020) for 4 mo f/u for BRVO OD w/DFE/OCT/Optos FA-Transit OD.  There are no Patient Instructions on file for this visit.   Explained the diagnoses, plan, and follow up with the patient and they expressed understanding.  Patient expressed understanding of the importance of proper follow up care.   This document serves as a record of services personally performed by Gardiner Sleeper, MD, PhD. It was created on their behalf by Leonie Douglas, an ophthalmic technician. The creation of this record is the provider's dictation and/or activities during the visit.    Electronically  signed by: Leonie Douglas COA, 01/27/20  12:28 PM   This document serves as a record of services personally performed by Gardiner Sleeper, MD, PhD. It was created on their behalf by San Jetty. Owens Shark, OA an ophthalmic technician. The creation of this record is the provider's dictation and/or activities during the visit.    Electronically signed by: San Jetty. Owens Shark, [place] 07.19.2021 12:28 PM  This document serves as a record of services personally performed by Gardiner Sleeper, MD, PhD. It was created on their behalf by Estill Bakes, COT an ophthalmic technician. The creation of this record is the provider's dictation and/or activities during the visit.    Electronically signed by: Estill Bakes, COT 01/27/20 @ 12:28 PM  Gardiner Sleeper, M.D., Ph.D. Diseases & Surgery of the Retina and Vitreous [facility]  I have reviewed the above documentation for accuracy and completeness, and I agree with the above. Gardiner Sleeper, M.D., Ph.D. 01/27/20 12:28 PM   Abbreviations: M myopia (nearsighted); A astigmatism; H hyperopia (farsighted); P presbyopia; Mrx spectacle prescription;  CTL contact lenses; OD right eye; OS left eye; OU both eyes  XT exotropia; ET esotropia;  PEK punctate epithelial keratitis; PEE punctate epithelial erosions; DES dry eye syndrome; MGD meibomian gland dysfunction; ATs artificial tears; PFAT's preservative free artificial tears; [place] nuclear sclerotic cataract; PSC posterior subcapsular cataract; ERM epi-retinal membrane; PVD posterior vitreous detachment; RD retinal detachment; DM diabetes mellitus; DR diabetic retinopathy; NPDR non-proliferative diabetic retinopathy; PDR proliferative diabetic retinopathy; CSME clinically significant macular edema; DME diabetic macular edema; dbh dot blot hemorrhages; CWS cotton wool spot; POAG primary open angle glaucoma; C/D cup-to-disc ratio; HVF humphrey visual field; GVF goldmann visual field; OCT optical coherence tomography;  IOP intraocular pressure; BRVO Branch retinal vein occlusion; CRVO central retinal vein occlusion; CRAO central retinal artery occlusion; BRAO branch retinal artery occlusion; RT retinal tear; SB scleral buckle; PPV pars plana vitrectomy; VH Vitreous hemorrhage; PRP panretinal laser photocoagulation; IVK intravitreal kenalog; VMT vitreomacular traction; MH Macular hole;  NVD neovascularization of the disc; NVE neovascularization elsewhere; AREDS age related eye disease study; ARMD age related macular degeneration; POAG primary open angle glaucoma; EBMD epithelial/anterior basement membrane dystrophy; ACIOL anterior chamber intraocular lens; IOL intraocular lens; PCIOL posterior chamber intraocular lens; Phaco/IOL phacoemulsification with intraocular lens placement; Guadalupe Guerra photorefractive keratectomy; LASIK laser assisted in situ keratomileusis; HTN hypertension; DM diabetes mellitus; COPD chronic obstructive pulmonary disease

## 2020-01-27 ENCOUNTER — Ambulatory Visit (INDEPENDENT_AMBULATORY_CARE_PROVIDER_SITE_OTHER): Payer: Medicare Other | Admitting: Ophthalmology

## 2020-01-27 ENCOUNTER — Encounter (INDEPENDENT_AMBULATORY_CARE_PROVIDER_SITE_OTHER): Payer: Self-pay | Admitting: Ophthalmology

## 2020-01-27 ENCOUNTER — Other Ambulatory Visit: Payer: Self-pay

## 2020-01-27 DIAGNOSIS — H348312 Tributary (branch) retinal vein occlusion, right eye, stable: Secondary | ICD-10-CM

## 2020-01-27 DIAGNOSIS — H3581 Retinal edema: Secondary | ICD-10-CM

## 2020-01-27 DIAGNOSIS — I10 Essential (primary) hypertension: Secondary | ICD-10-CM

## 2020-01-27 DIAGNOSIS — H25813 Combined forms of age-related cataract, bilateral: Secondary | ICD-10-CM

## 2020-01-27 DIAGNOSIS — H35011 Changes in retinal vascular appearance, right eye: Secondary | ICD-10-CM

## 2020-01-27 DIAGNOSIS — H35033 Hypertensive retinopathy, bilateral: Secondary | ICD-10-CM | POA: Diagnosis not present

## 2020-01-27 DIAGNOSIS — E113393 Type 2 diabetes mellitus with moderate nonproliferative diabetic retinopathy without macular edema, bilateral: Secondary | ICD-10-CM

## 2020-02-10 DIAGNOSIS — M6259 Muscle wasting and atrophy, not elsewhere classified, multiple sites: Secondary | ICD-10-CM | POA: Diagnosis not present

## 2020-02-10 DIAGNOSIS — R278 Other lack of coordination: Secondary | ICD-10-CM | POA: Diagnosis not present

## 2020-02-10 DIAGNOSIS — R1312 Dysphagia, oropharyngeal phase: Secondary | ICD-10-CM | POA: Diagnosis not present

## 2020-02-10 DIAGNOSIS — G9341 Metabolic encephalopathy: Secondary | ICD-10-CM | POA: Diagnosis not present

## 2020-02-10 DIAGNOSIS — N184 Chronic kidney disease, stage 4 (severe): Secondary | ICD-10-CM | POA: Diagnosis not present

## 2020-02-10 DIAGNOSIS — R2681 Unsteadiness on feet: Secondary | ICD-10-CM | POA: Diagnosis not present

## 2020-02-10 DIAGNOSIS — R2689 Other abnormalities of gait and mobility: Secondary | ICD-10-CM | POA: Diagnosis not present

## 2020-02-10 DIAGNOSIS — I480 Paroxysmal atrial fibrillation: Secondary | ICD-10-CM | POA: Diagnosis not present

## 2020-02-10 DIAGNOSIS — M6281 Muscle weakness (generalized): Secondary | ICD-10-CM | POA: Diagnosis not present

## 2020-02-10 DIAGNOSIS — J449 Chronic obstructive pulmonary disease, unspecified: Secondary | ICD-10-CM | POA: Diagnosis not present

## 2020-02-10 DIAGNOSIS — Z9181 History of falling: Secondary | ICD-10-CM | POA: Diagnosis not present

## 2020-02-13 DIAGNOSIS — D098 Carcinoma in situ of other specified sites: Secondary | ICD-10-CM | POA: Diagnosis not present

## 2020-02-13 DIAGNOSIS — D048 Carcinoma in situ of skin of other sites: Secondary | ICD-10-CM | POA: Diagnosis not present

## 2020-02-14 DIAGNOSIS — Z03818 Encounter for observation for suspected exposure to other biological agents ruled out: Secondary | ICD-10-CM | POA: Diagnosis not present

## 2020-02-20 DIAGNOSIS — Z20822 Contact with and (suspected) exposure to covid-19: Secondary | ICD-10-CM | POA: Diagnosis not present

## 2020-02-26 DIAGNOSIS — Z20822 Contact with and (suspected) exposure to covid-19: Secondary | ICD-10-CM | POA: Diagnosis not present

## 2020-03-02 DIAGNOSIS — Z20822 Contact with and (suspected) exposure to covid-19: Secondary | ICD-10-CM | POA: Diagnosis not present

## 2020-03-04 DIAGNOSIS — C44319 Basal cell carcinoma of skin of other parts of face: Secondary | ICD-10-CM | POA: Diagnosis not present

## 2020-03-04 DIAGNOSIS — C4442 Squamous cell carcinoma of skin of scalp and neck: Secondary | ICD-10-CM | POA: Diagnosis not present

## 2020-03-06 DIAGNOSIS — Z20822 Contact with and (suspected) exposure to covid-19: Secondary | ICD-10-CM | POA: Diagnosis not present

## 2020-03-11 DIAGNOSIS — Z9181 History of falling: Secondary | ICD-10-CM | POA: Diagnosis not present

## 2020-03-11 DIAGNOSIS — R1312 Dysphagia, oropharyngeal phase: Secondary | ICD-10-CM | POA: Diagnosis not present

## 2020-03-11 DIAGNOSIS — R2681 Unsteadiness on feet: Secondary | ICD-10-CM | POA: Diagnosis not present

## 2020-03-11 DIAGNOSIS — R278 Other lack of coordination: Secondary | ICD-10-CM | POA: Diagnosis not present

## 2020-03-11 DIAGNOSIS — M6259 Muscle wasting and atrophy, not elsewhere classified, multiple sites: Secondary | ICD-10-CM | POA: Diagnosis not present

## 2020-03-11 DIAGNOSIS — J449 Chronic obstructive pulmonary disease, unspecified: Secondary | ICD-10-CM | POA: Diagnosis not present

## 2020-03-11 DIAGNOSIS — M6281 Muscle weakness (generalized): Secondary | ICD-10-CM | POA: Diagnosis not present

## 2020-03-11 DIAGNOSIS — I480 Paroxysmal atrial fibrillation: Secondary | ICD-10-CM | POA: Diagnosis not present

## 2020-03-11 DIAGNOSIS — G9341 Metabolic encephalopathy: Secondary | ICD-10-CM | POA: Diagnosis not present

## 2020-03-11 DIAGNOSIS — N184 Chronic kidney disease, stage 4 (severe): Secondary | ICD-10-CM | POA: Diagnosis not present

## 2020-03-11 DIAGNOSIS — R2689 Other abnormalities of gait and mobility: Secondary | ICD-10-CM | POA: Diagnosis not present

## 2020-03-17 DIAGNOSIS — Z20822 Contact with and (suspected) exposure to covid-19: Secondary | ICD-10-CM | POA: Diagnosis not present

## 2020-03-17 DIAGNOSIS — C44319 Basal cell carcinoma of skin of other parts of face: Secondary | ICD-10-CM | POA: Diagnosis not present

## 2020-03-17 DIAGNOSIS — D044 Carcinoma in situ of skin of scalp and neck: Secondary | ICD-10-CM | POA: Diagnosis not present

## 2020-03-17 DIAGNOSIS — L820 Inflamed seborrheic keratosis: Secondary | ICD-10-CM | POA: Diagnosis not present

## 2020-03-17 DIAGNOSIS — S0100XD Unspecified open wound of scalp, subsequent encounter: Secondary | ICD-10-CM | POA: Diagnosis not present

## 2020-03-19 DIAGNOSIS — Z03818 Encounter for observation for suspected exposure to other biological agents ruled out: Secondary | ICD-10-CM | POA: Diagnosis not present

## 2020-03-19 DIAGNOSIS — D099 Carcinoma in situ, unspecified: Secondary | ICD-10-CM | POA: Diagnosis not present

## 2020-03-19 DIAGNOSIS — I480 Paroxysmal atrial fibrillation: Secondary | ICD-10-CM | POA: Diagnosis not present

## 2020-03-19 DIAGNOSIS — N184 Chronic kidney disease, stage 4 (severe): Secondary | ICD-10-CM | POA: Diagnosis not present

## 2020-03-19 DIAGNOSIS — E785 Hyperlipidemia, unspecified: Secondary | ICD-10-CM | POA: Diagnosis not present

## 2020-03-19 DIAGNOSIS — I739 Peripheral vascular disease, unspecified: Secondary | ICD-10-CM | POA: Diagnosis not present

## 2020-03-19 DIAGNOSIS — I10 Essential (primary) hypertension: Secondary | ICD-10-CM | POA: Diagnosis not present

## 2020-03-19 DIAGNOSIS — E1140 Type 2 diabetes mellitus with diabetic neuropathy, unspecified: Secondary | ICD-10-CM | POA: Diagnosis not present

## 2020-03-19 DIAGNOSIS — E119 Type 2 diabetes mellitus without complications: Secondary | ICD-10-CM | POA: Diagnosis not present

## 2020-03-19 DIAGNOSIS — I5032 Chronic diastolic (congestive) heart failure: Secondary | ICD-10-CM | POA: Diagnosis not present

## 2020-03-19 DIAGNOSIS — D049 Carcinoma in situ of skin, unspecified: Secondary | ICD-10-CM | POA: Diagnosis not present

## 2020-03-19 DIAGNOSIS — I129 Hypertensive chronic kidney disease with stage 1 through stage 4 chronic kidney disease, or unspecified chronic kidney disease: Secondary | ICD-10-CM | POA: Diagnosis not present

## 2020-03-19 DIAGNOSIS — J984 Other disorders of lung: Secondary | ICD-10-CM | POA: Diagnosis not present

## 2020-03-23 DIAGNOSIS — I5030 Unspecified diastolic (congestive) heart failure: Secondary | ICD-10-CM | POA: Diagnosis not present

## 2020-03-23 DIAGNOSIS — E119 Type 2 diabetes mellitus without complications: Secondary | ICD-10-CM | POA: Diagnosis not present

## 2020-03-23 DIAGNOSIS — E559 Vitamin D deficiency, unspecified: Secondary | ICD-10-CM | POA: Diagnosis not present

## 2020-03-24 DIAGNOSIS — I10 Essential (primary) hypertension: Secondary | ICD-10-CM | POA: Diagnosis not present

## 2020-03-24 DIAGNOSIS — H401134 Primary open-angle glaucoma, bilateral, indeterminate stage: Secondary | ICD-10-CM | POA: Diagnosis not present

## 2020-03-24 DIAGNOSIS — E119 Type 2 diabetes mellitus without complications: Secondary | ICD-10-CM | POA: Diagnosis not present

## 2020-03-24 DIAGNOSIS — Z20822 Contact with and (suspected) exposure to covid-19: Secondary | ICD-10-CM | POA: Diagnosis not present

## 2020-03-24 DIAGNOSIS — N184 Chronic kidney disease, stage 4 (severe): Secondary | ICD-10-CM | POA: Diagnosis not present

## 2020-03-24 DIAGNOSIS — E785 Hyperlipidemia, unspecified: Secondary | ICD-10-CM | POA: Diagnosis not present

## 2020-03-26 DIAGNOSIS — Z20822 Contact with and (suspected) exposure to covid-19: Secondary | ICD-10-CM | POA: Diagnosis not present

## 2020-03-30 DIAGNOSIS — R3989 Other symptoms and signs involving the genitourinary system: Secondary | ICD-10-CM | POA: Diagnosis not present

## 2020-03-30 DIAGNOSIS — R7989 Other specified abnormal findings of blood chemistry: Secondary | ICD-10-CM | POA: Diagnosis not present

## 2020-03-30 DIAGNOSIS — Z20822 Contact with and (suspected) exposure to covid-19: Secondary | ICD-10-CM | POA: Diagnosis not present

## 2020-03-30 DIAGNOSIS — R300 Dysuria: Secondary | ICD-10-CM | POA: Diagnosis not present

## 2020-03-30 DIAGNOSIS — M549 Dorsalgia, unspecified: Secondary | ICD-10-CM | POA: Diagnosis not present

## 2020-03-31 DIAGNOSIS — I2510 Atherosclerotic heart disease of native coronary artery without angina pectoris: Secondary | ICD-10-CM | POA: Diagnosis not present

## 2020-03-31 DIAGNOSIS — N390 Urinary tract infection, site not specified: Secondary | ICD-10-CM | POA: Diagnosis not present

## 2020-04-01 DIAGNOSIS — R600 Localized edema: Secondary | ICD-10-CM | POA: Diagnosis not present

## 2020-04-01 DIAGNOSIS — N184 Chronic kidney disease, stage 4 (severe): Secondary | ICD-10-CM | POA: Diagnosis not present

## 2020-04-01 DIAGNOSIS — N390 Urinary tract infection, site not specified: Secondary | ICD-10-CM | POA: Diagnosis not present

## 2020-04-02 DIAGNOSIS — U071 COVID-19: Secondary | ICD-10-CM | POA: Diagnosis not present

## 2020-04-06 DIAGNOSIS — U071 COVID-19: Secondary | ICD-10-CM | POA: Diagnosis not present

## 2020-04-07 DIAGNOSIS — S0100XD Unspecified open wound of scalp, subsequent encounter: Secondary | ICD-10-CM | POA: Diagnosis not present

## 2020-04-09 DIAGNOSIS — Z20822 Contact with and (suspected) exposure to covid-19: Secondary | ICD-10-CM | POA: Diagnosis not present

## 2020-04-10 DIAGNOSIS — R262 Difficulty in walking, not elsewhere classified: Secondary | ICD-10-CM | POA: Diagnosis not present

## 2020-04-10 DIAGNOSIS — N184 Chronic kidney disease, stage 4 (severe): Secondary | ICD-10-CM | POA: Diagnosis not present

## 2020-04-10 DIAGNOSIS — R1312 Dysphagia, oropharyngeal phase: Secondary | ICD-10-CM | POA: Diagnosis not present

## 2020-04-10 DIAGNOSIS — G9341 Metabolic encephalopathy: Secondary | ICD-10-CM | POA: Diagnosis not present

## 2020-04-10 DIAGNOSIS — R2689 Other abnormalities of gait and mobility: Secondary | ICD-10-CM | POA: Diagnosis not present

## 2020-04-10 DIAGNOSIS — E119 Type 2 diabetes mellitus without complications: Secondary | ICD-10-CM | POA: Diagnosis not present

## 2020-04-10 DIAGNOSIS — N139 Obstructive and reflux uropathy, unspecified: Secondary | ICD-10-CM | POA: Diagnosis not present

## 2020-04-10 DIAGNOSIS — R278 Other lack of coordination: Secondary | ICD-10-CM | POA: Diagnosis not present

## 2020-04-10 DIAGNOSIS — M6281 Muscle weakness (generalized): Secondary | ICD-10-CM | POA: Diagnosis not present

## 2020-04-10 DIAGNOSIS — I480 Paroxysmal atrial fibrillation: Secondary | ICD-10-CM | POA: Diagnosis not present

## 2020-04-10 DIAGNOSIS — M6259 Muscle wasting and atrophy, not elsewhere classified, multiple sites: Secondary | ICD-10-CM | POA: Diagnosis not present

## 2020-04-10 DIAGNOSIS — R41841 Cognitive communication deficit: Secondary | ICD-10-CM | POA: Diagnosis not present

## 2020-04-10 DIAGNOSIS — Z9181 History of falling: Secondary | ICD-10-CM | POA: Diagnosis not present

## 2020-04-10 DIAGNOSIS — J449 Chronic obstructive pulmonary disease, unspecified: Secondary | ICD-10-CM | POA: Diagnosis not present

## 2020-04-10 DIAGNOSIS — R2681 Unsteadiness on feet: Secondary | ICD-10-CM | POA: Diagnosis not present

## 2020-04-14 DIAGNOSIS — U071 COVID-19: Secondary | ICD-10-CM | POA: Diagnosis not present

## 2020-04-15 DIAGNOSIS — R7981 Abnormal blood-gas level: Secondary | ICD-10-CM | POA: Diagnosis not present

## 2020-04-16 DIAGNOSIS — U071 COVID-19: Secondary | ICD-10-CM | POA: Diagnosis not present

## 2020-04-22 DIAGNOSIS — N390 Urinary tract infection, site not specified: Secondary | ICD-10-CM | POA: Diagnosis not present

## 2020-04-23 DIAGNOSIS — R300 Dysuria: Secondary | ICD-10-CM | POA: Diagnosis not present

## 2020-04-27 DIAGNOSIS — N390 Urinary tract infection, site not specified: Secondary | ICD-10-CM | POA: Diagnosis not present

## 2020-04-27 DIAGNOSIS — N478 Other disorders of prepuce: Secondary | ICD-10-CM | POA: Diagnosis not present

## 2020-04-27 DIAGNOSIS — N4889 Other specified disorders of penis: Secondary | ICD-10-CM | POA: Diagnosis not present

## 2020-04-27 DIAGNOSIS — U071 COVID-19: Secondary | ICD-10-CM | POA: Diagnosis not present

## 2020-04-30 DIAGNOSIS — Z20822 Contact with and (suspected) exposure to covid-19: Secondary | ICD-10-CM | POA: Diagnosis not present

## 2020-05-02 DIAGNOSIS — N390 Urinary tract infection, site not specified: Secondary | ICD-10-CM | POA: Diagnosis not present

## 2020-05-03 ENCOUNTER — Emergency Department (HOSPITAL_COMMUNITY)
Admission: EM | Admit: 2020-05-03 | Discharge: 2020-05-04 | Disposition: A | Discharge status: Home / Self Care | Payer: Medicare Other | Attending: Emergency Medicine | Admitting: Emergency Medicine

## 2020-05-03 ENCOUNTER — Emergency Department (HOSPITAL_COMMUNITY): Payer: Medicare Other

## 2020-05-03 ENCOUNTER — Encounter (HOSPITAL_COMMUNITY): Payer: Self-pay | Admitting: Emergency Medicine

## 2020-05-03 ENCOUNTER — Other Ambulatory Visit: Payer: Self-pay

## 2020-05-03 DIAGNOSIS — Z79899 Other long term (current) drug therapy: Secondary | ICD-10-CM | POA: Insufficient documentation

## 2020-05-03 DIAGNOSIS — N471 Phimosis: Secondary | ICD-10-CM | POA: Insufficient documentation

## 2020-05-03 DIAGNOSIS — I509 Heart failure, unspecified: Secondary | ICD-10-CM | POA: Insufficient documentation

## 2020-05-03 DIAGNOSIS — R5381 Other malaise: Secondary | ICD-10-CM | POA: Diagnosis not present

## 2020-05-03 DIAGNOSIS — Z794 Long term (current) use of insulin: Secondary | ICD-10-CM | POA: Diagnosis not present

## 2020-05-03 DIAGNOSIS — Z7901 Long term (current) use of anticoagulants: Secondary | ICD-10-CM | POA: Diagnosis not present

## 2020-05-03 DIAGNOSIS — Z7984 Long term (current) use of oral hypoglycemic drugs: Secondary | ICD-10-CM | POA: Diagnosis not present

## 2020-05-03 DIAGNOSIS — I130 Hypertensive heart and chronic kidney disease with heart failure and stage 1 through stage 4 chronic kidney disease, or unspecified chronic kidney disease: Secondary | ICD-10-CM | POA: Insufficient documentation

## 2020-05-03 DIAGNOSIS — R2681 Unsteadiness on feet: Secondary | ICD-10-CM | POA: Diagnosis not present

## 2020-05-03 DIAGNOSIS — R338 Other retention of urine: Secondary | ICD-10-CM | POA: Diagnosis not present

## 2020-05-03 DIAGNOSIS — E1122 Type 2 diabetes mellitus with diabetic chronic kidney disease: Secondary | ICD-10-CM | POA: Diagnosis not present

## 2020-05-03 DIAGNOSIS — J449 Chronic obstructive pulmonary disease, unspecified: Secondary | ICD-10-CM | POA: Insufficient documentation

## 2020-05-03 DIAGNOSIS — N184 Chronic kidney disease, stage 4 (severe): Secondary | ICD-10-CM | POA: Diagnosis not present

## 2020-05-03 DIAGNOSIS — R3912 Poor urinary stream: Secondary | ICD-10-CM | POA: Diagnosis not present

## 2020-05-03 DIAGNOSIS — R0989 Other specified symptoms and signs involving the circulatory and respiratory systems: Secondary | ICD-10-CM | POA: Diagnosis not present

## 2020-05-03 DIAGNOSIS — R339 Retention of urine, unspecified: Secondary | ICD-10-CM

## 2020-05-03 DIAGNOSIS — R52 Pain, unspecified: Secondary | ICD-10-CM | POA: Diagnosis not present

## 2020-05-03 DIAGNOSIS — R609 Edema, unspecified: Secondary | ICD-10-CM | POA: Diagnosis not present

## 2020-05-03 LAB — URINALYSIS, ROUTINE W REFLEX MICROSCOPIC
Bilirubin Urine: NEGATIVE
Glucose, UA: NEGATIVE mg/dL
Ketones, ur: NEGATIVE mg/dL
Nitrite: POSITIVE — AB
Protein, ur: NEGATIVE mg/dL
Specific Gravity, Urine: 1.009 (ref 1.005–1.030)
pH: 6 (ref 5.0–8.0)

## 2020-05-03 LAB — COMPREHENSIVE METABOLIC PANEL
ALT: 25 U/L (ref 0–44)
AST: 18 U/L (ref 15–41)
Albumin: 3.4 g/dL — ABNORMAL LOW (ref 3.5–5.0)
Alkaline Phosphatase: 56 U/L (ref 38–126)
Anion gap: 11 (ref 5–15)
BUN: 34 mg/dL — ABNORMAL HIGH (ref 8–23)
CO2: 27 mmol/L (ref 22–32)
Calcium: 10.2 mg/dL (ref 8.9–10.3)
Chloride: 103 mmol/L (ref 98–111)
Creatinine, Ser: 2.11 mg/dL — ABNORMAL HIGH (ref 0.61–1.24)
GFR, Estimated: 31 mL/min — ABNORMAL LOW (ref >60)
Glucose, Bld: 204 mg/dL — ABNORMAL HIGH (ref 70–99)
Potassium: 4.8 mmol/L (ref 3.5–5.1)
Sodium: 141 mmol/L (ref 135–145)
Total Bilirubin: 0.6 mg/dL (ref 0.3–1.2)
Total Protein: 6.2 g/dL — ABNORMAL LOW (ref 6.5–8.1)

## 2020-05-03 LAB — CBC
HCT: 51.6 % (ref 39.0–52.0)
Hemoglobin: 16.5 g/dL (ref 13.0–17.0)
MCH: 29.9 pg (ref 26.0–34.0)
MCHC: 32 g/dL (ref 30.0–36.0)
MCV: 93.5 fL (ref 80.0–100.0)
Platelets: 174 10*3/uL (ref 150–400)
RBC: 5.52 MIL/uL (ref 4.22–5.81)
RDW: 14 % (ref 11.5–15.5)
WBC: 8.6 10*3/uL (ref 4.0–10.5)
nRBC: 0 % (ref 0.0–0.2)

## 2020-05-03 MED ORDER — LIDOCAINE HCL URETHRAL/MUCOSAL 2 % EX GEL
1.0000 "application " | Freq: Once | CUTANEOUS | Status: AC
  Administered 2020-05-03: 1 via URETHRAL
  Filled 2020-05-03: qty 11

## 2020-05-03 NOTE — Consult Note (Addendum)
Consultation: Urinary retention, phimosis Requested by: Dr. Theotis Burrow  History of Present Illness: Reginald [name] is a 84 year old male with a history of urinary retention and bilateral hydronephrosis.  He was seen by Dr. Diona Fanti in 2017 with incontinence and hematuria and CT scan revealed mild bilateral hydronephrosis with a full bladder.  Cystoscopy was benign.  He was observed.  He was admitted June 2020 with acute kidney injury with a baseline creatinine around 2 up to 4.  Again a renal ultrasound showed bilateral hydronephrosis down to a distended bladder.  A Foley catheter was able to be placed.  He followed up in the office and passed a voiding trial.  He presents to emergency room tonight with about a week history of a weak stream, straining to urinate and difficulty emptying as well as incontinence.  Nurses tried to place a Foley catheter but he had a tight phimosis and urology was consulted.  His creatinine is at baseline around 2.  He has had no fever.  Past Medical History:  Diagnosis Date  . Anteroseptal myocardial infarction ([place])   . CHF (congestive heart failure) ([place])   . Chronic kidney disease   . COPD (chronic obstructive pulmonary disease) ([place])   . Coronary atherosclerosis of unspecified type of vessel, native or graft    a. s/p prior PCI to LAD in 1997  . Diverticulosis   . Glaucoma   . Hypercholesteremia   . Hypertensive renal disease   . Hypokalemia   . Obesity hypoventilation syndrome ([place])   . Obesity, unspecified   . OSA (obstructive sleep apnea)   . Permanent atrial fibrillation ([place])   . Rhabdomyolysis   . Type II or unspecified type diabetes mellitus without mention of complication, not stated as uncontrolled   . Unspecified essential hypertension   . Vestibulopathy    Past Surgical History:  Procedure Laterality Date  . APPENDECTOMY  1985  . CARDIAC CATHETERIZATION    . CHOLECYSTECTOMY  06/2000  . CORONARY ANGIOPLASTY  08/14/1995   stent placement  to LAD   . ENDOVENOUS ABLATION SAPHENOUS VEIN W/ LASER Left 02/21/2018   endovenous laser ablation L GSV by Ruta Hinds MD   . [place] Right 1985  . KNEE ARTHROSCOPY Left 1991  . LUMBAR FUSION    . NOSE SURGERY  1970s   Per Dr. Terrence [facility] in pt chart    Home Medications:  (Not in a hospital admission)  Allergies:  Allergies  Allergen Reactions  . Adhesive [Tape] Rash    Family History  Problem Relation Age of Onset  . COPD Mother   . Heart disease Mother   . Diabetes Father   . Heart disease Father   . Diabetes Sister   . Heart disease Brother   . Heart disease Brother   . Breast cancer Maternal Aunt   . Diabetes Paternal Grandmother   . Colon cancer Maternal Aunt   . Ovarian cancer Daughter   . Glaucoma Other    Social History:  reports that he has never smoked. He has never used smokeless tobacco. He reports that he does not drink alcohol and does not use drugs.  ROS: A complete review of systems was performed.  All systems are negative except for pertinent findings as noted. Review of Systems  All other systems reviewed and are negative.    Physical Exam:  Vital signs in last 24 hours: Temp:  [98.1 F (36.7 C)] 98.1 F (36.7 C) (10/24 1752) Pulse Rate:  [55-82]  58 (10/24 2200) Resp:  [18-20] 18 (10/24 2200) BP: (125-152)/(77-92) 130/77 (10/24 2200) SpO2:  [94 %-97 %] 95 % (10/24 2200) Weight:  [145.2 kg] 145.2 kg (10/24 1800) General:  Alert and oriented, No acute distress HEENT: Normocephalic, atraumatic Cardiovascular: Regular rate and rhythm Lungs: Regular rate and effort Abdomen: Soft, nontender, nondistended, no abdominal masses Back: No CVA tenderness Extremities: No edema Neurologic: Grossly intact GU: Tight phimosis.  Otherwise foreskin visibly normal, glans palpably normal.  Scrotum appeared normal.  Procedure: I prepped the foreskin and tried to pass an 31 [place] and a 41 [place] coud catheter but did not think I was  getting through the phimotic skin.  I then took a zip wire and used the cheater but could not get enough traction on the foreskin and the patient was having some pain.  Therefore I discussed with him placement of a small injection of local at the 12 o'clock position so that I could get some traction on the foreskin and see if we could find our way through the 5 myotic ring.  He consented.  The foreskin was again prepped with Betadine and about 5 cc 2% lidocaine with epi was instilled at the 12 o'clock position of the foreskin.  Now I was able to grab the foreskin with a tissue forcep and put in on traction.  This allowed me to take the cheater and advance it through the phimotic band and then I was able to sound out the meatus and advanced the cheater through the meatus.  I had some trouble passing the zip wire so I switched over to a sensor wire which passed easily into the bladder.  Once the sensor wire was placed he voided a good amount in the bed.  Over the sensor wire was then able to pass a 40 [place] council tip catheter which popped through the phimotic ring and then went into the bladder without difficulty.  He felt relief and the catheter draining clear urine.  At this point he only drained about 200 cc.  Laboratory Data:  Results for orders placed or performed during the hospital encounter of 05/03/20 (from the past 24 hour(s))  Urinalysis, Routine w reflex microscopic     Status: Abnormal   Collection Time: 05/03/20  6:03 PM  Result Value Ref Range   Color, Urine YELLOW YELLOW   APPearance CLEAR CLEAR   Specific Gravity, Urine 1.009 1.005 - 1.030   pH 6.0 5.0 - 8.0   Glucose, UA NEGATIVE NEGATIVE mg/dL   Hgb urine dipstick LARGE (A) NEGATIVE   Bilirubin Urine NEGATIVE NEGATIVE   Ketones, ur NEGATIVE NEGATIVE mg/dL   Protein, ur NEGATIVE NEGATIVE mg/dL   Nitrite POSITIVE (A) NEGATIVE   Leukocytes,Ua MODERATE (A) NEGATIVE   RBC / HPF 21-50 0 - 5 RBC/hpf   WBC, UA 21-50 0 - 5 WBC/hpf    Bacteria, UA RARE (A) NONE SEEN   Squamous Epithelial / LPF 0-5 0 - 5   Mucus PRESENT   Comprehensive metabolic panel     Status: Abnormal   Collection Time: 05/03/20  6:19 PM  Result Value Ref Range   Sodium 141 135 - 145 mmol/L   Potassium 4.8 3.5 - 5.1 mmol/L   Chloride 103 98 - 111 mmol/L   CO2 27 22 - 32 mmol/L   Glucose, Bld 204 (H) 70 - 99 mg/dL   BUN 34 (H) 8 - 23 mg/dL   Creatinine, Ser 2.11 (H) 0.61 - 1.24 mg/dL   Calcium  10.2 8.9 - 10.3 mg/dL   Total Protein 6.2 (L) 6.5 - 8.1 g/dL   Albumin 3.4 (L) 3.5 - 5.0 g/dL   AST 18 15 - 41 U/L   ALT 25 0 - 44 U/L   Alkaline Phosphatase 56 38 - 126 U/L   Total Bilirubin 0.6 0.3 - 1.2 mg/dL   GFR, Estimated 31 (L) >60 mL/min   Anion gap 11 5 - 15  CBC     Status: None   Collection Time: 05/03/20  6:19 PM  Result Value Ref Range   WBC 8.6 4.0 - 10.5 K/uL   RBC 5.52 4.22 - 5.81 MIL/uL   Hemoglobin 16.5 13.0 - 17.0 g/dL   HCT 51.6 39 - 52 %   MCV 93.5 80.0 - 100.0 fL   MCH 29.9 26.0 - 34.0 pg   MCHC 32.0 30.0 - 36.0 g/dL   RDW 14.0 11.5 - 15.5 %   Platelets 174 150 - 400 K/uL   nRBC 0.0 0.0 - 0.2 %   No results found for this or any previous visit (from the past 240 hour(s)). Creatinine: Recent Labs    05/03/20 1819  CREATININE 2.11*    Impression/Assessment/plan:  Urinary retention-he has had episodes of retention with hydro in the past, creatinine is at baseline now.  This episode may be related to the tight phimosis.  Phimosis-catheter placed.  Per Dr. Rex Kras patient is on Cipro for possible UTI and this should cover him well for foley placement. He is on blood thinners for A. fib and would likely be served best by being seen in the office for possible voiding trial versus preop appointment for dorsal slit which would need cardiology clearance.  I will send a note to Dr. Diona Fanti to arrange follow-up - addendcum - pt must have call last week, I see he already has an appt scheduled with Dr. Diona Fanti at 3 pm on  05/06/2020. He should keep that appt.     Festus Aloe 05/03/2020, 10:43 PM

## 2020-05-03 NOTE — Discharge Instructions (Addendum)
LEAVE FOLEY CATHETER IN PLACE UNTIL YOU FOLLOW UP IN THE UROLOGY CLINIC.  CONTINUE CIPROFLOXACIN FOR A TOTAL OF 7 DAY COURSE (STARTED ON 10/23 AT NURSING FACILITY.)  RETURN TO ER IF ANY FEVER, CONFUSION, LETHARGY, ABDOMINAL PAIN, HEAVY BLEEDING IN URINE, OR CATHETER NOT DRAINING.

## 2020-05-03 NOTE — ED Provider Notes (Signed)
[facility] DEPT Provider Note   CSN: [id number] Arrival date & time: 05/03/20  1736     History Chief Complaint  Patient presents with  . Urinary Retention    Reginald [name] is a 84 y.o. male.  84yo M w/ extensive PMH below including CHF, CKD, OSA, obesity hypoventilation syndrome, T2DM, CAD who p/w urinary retention.  Patient reports that for the past 3 days, he has had problems with urinary retention and difficulty initiating urinary stream.  He has noted some swelling of his penis but he also has chronic lower extremity edema and notes that he has had an 11 pound weight gain over the past few days.  They attempted an in and out catheter at his nursing facility today without success which is what prompted them to send him in for evaluation.  He denies any history of BPH or previous Foley catheter use but does note he has seen a urologist previously for hematuria.  He reports some burning with urination currently.  He also reports that they recently started him on antibiotics for UTI.  MAR shows ciprofloxacin starting on 10/23.  He denies any fevers, vomiting, or abdominal pain.  He denies any breathing problems.  He does have to wear oxygen at night.  The history is provided by the patient and the nursing home.       Past Medical History:  Diagnosis Date  . Anteroseptal myocardial infarction ([place])   . CHF (congestive heart failure) ([place])   . Chronic kidney disease   . COPD (chronic obstructive pulmonary disease) ([place])   . Coronary atherosclerosis of unspecified type of vessel, native or graft    a. s/p prior PCI to LAD in 1997  . Diverticulosis   . Glaucoma   . Hypercholesteremia   . Hypertensive renal disease   . Hypokalemia   . Obesity hypoventilation syndrome ([place])   . Obesity, unspecified   . OSA (obstructive sleep apnea)   . Permanent atrial fibrillation ([place])   . Rhabdomyolysis   . Type II or unspecified type diabetes mellitus without  mention of complication, not stated as uncontrolled   . Unspecified essential hypertension   . Vestibulopathy     Patient Active Problem List   Diagnosis Date Noted  . Acute on chronic renal failure ([place]) 12/19/2018  . Acute renal failure superimposed on stage 4 chronic kidney disease ([place]) 12/19/2018  . AKI (acute kidney injury) ([place]) 11/26/2018  . HCAP (healthcare-associated pneumonia) 11/15/2018  . Acute respiratory disease due to COVID-19 virus 11/15/2018  . Chronic venous insufficiency 10/04/2017  . Venous stasis ulcers of both lower extremities ([place]) 10/04/2017  . Nonspecific chest pain 10/27/2016  . Morbid obesity ([place]) 05/02/2016  . Lymphedema 05/02/2016  . Abnormal laboratory test result 10/15/2015  . Cough 10/08/2015  . Hematuria 09/21/2015  . Hypokalemia 08/13/2015  . HTN (hypertension) 08/13/2015  . Multiple open wounds of lower leg 07/22/2015  . CKD (chronic kidney disease) stage 4, GFR 15-29 ml/min (HCC) 07/22/2015  . Cellulitis of leg, right 06/28/2015  . ARF (acute renal failure) ([place]) 06/28/2015  . Hypertensive heart disease with CHF (congestive heart failure) ([place]) 05/19/2015  . CAD (coronary artery disease), native coronary artery 05/19/2015  . Hyperlipidemia 05/19/2015  . Vitamin D deficiency 05/19/2015  . Pressure ulcer 05/16/2015  . Diastolic dysfunction with chronic heart failure ([place]) 05/08/2015  . Blisters of multiple sites 05/08/2015  . Type 2 diabetes mellitus ([place])   . Obesity, unspecified   . OSA (  obstructive sleep apnea)   . Atrial fibrillation ([place])   . Secondary DM with CKD stage 4 and hypertension ([place])   . Glaucoma   . Cellulitis 05/07/2015    Past Surgical History:  Procedure Laterality Date  . APPENDECTOMY  1985  . CARDIAC CATHETERIZATION    . CHOLECYSTECTOMY  06/2000  . CORONARY ANGIOPLASTY  08/14/1995   stent placement to LAD   . ENDOVENOUS ABLATION SAPHENOUS VEIN W/ LASER Left 02/21/2018   endovenous laser ablation L GSV by  Ruta Hinds MD   . [place] Right 1985  . KNEE ARTHROSCOPY Left 1991  . LUMBAR FUSION    . NOSE SURGERY  1970s   Per Dr. Terrence Dupont [facility] in pt chart       Family History  Problem Relation Age of Onset  . COPD Mother   . Heart disease Mother   . Diabetes Father   . Heart disease Father   . Diabetes Sister   . Heart disease Brother   . Heart disease Brother   . Breast cancer Maternal Aunt   . Diabetes Paternal Grandmother   . Colon cancer Maternal Aunt   . Ovarian cancer Daughter   . Glaucoma Other     Social History   Tobacco Use  . Smoking status: Never Smoker  . Smokeless tobacco: Never Used  Substance Use Topics  . Alcohol use: No    Alcohol/week: 0.0 standard drinks  . Drug use: No    Home Medications Prior to Admission medications   Medication Sig Start Date End Date Taking? Authorizing Provider  acetaminophen (TYLENOL) 325 MG tablet Take 650 mg by mouth every 4 (four) hours as needed (pain).    Yes [provider]  albuterol (VENTOLIN HFA) 108 (90 Base) MCG/ACT inhaler Inhale 2 puffs into the lungs 2 (two) times daily as needed for wheezing or shortness of breath.   Yes [provider]  apixaban (ELIQUIS) 2.5 MG TABS tablet Take 1 tablet (2.5 mg total) by mouth 2 (two) times daily. 12/25/18  Yes Hall, Carole N, DO  atorvastatin (LIPITOR) 20 MG tablet Take 20 mg by mouth at bedtime.    Yes [provider]  cetirizine (ZYRTEC) 10 MG tablet Take 5 mg by mouth at bedtime.    Yes [provider]  cholecalciferol (VITAMIN D) 25 MCG (1000 UNIT) tablet Take 2,000 Units by mouth daily.   Yes [provider]  ciprofloxacin (CIPRO) 250 MG tablet Take 250 mg by mouth 2 (two) times daily.   Yes [provider]  clotrimazole (LOTRIMIN) 1 % cream Apply 1 application topically See admin instructions. Ordered 05/02/2020: Apply topically to head and gland of penis twice daily for 10 days   Yes [provider]   docusate sodium (COLACE) 100 MG capsule Take 100 mg by mouth at bedtime.   Yes [provider]  Dulaglutide (TRULICITY) 3.71 GG/2.6RS SOPN Inject 3 mg into the skin every Monday.    Yes [provider]  gabapentin (NEURONTIN) 100 MG capsule Take 200 mg by mouth at bedtime.    Yes [provider]  insulin lispro (HUMALOG) 100 UNIT/ML injection Inject 0-14 Units into the skin See admin instructions. Inject 0-14 units subcutaneously twice daily per sliding scale: CBG 0-200 0 units, 201-250 2 units, 251-300 4 units, 301-350 6 units, 351-400 8 units, 401-450 10 units, 451-500 12 units, >500 14 units   Yes [provider]  latanoprost (XALATAN) 0.005 % ophthalmic solution Place 1 drop  into both eyes at bedtime. 01/03/17  Yes [provider]  magnesium oxide (MAG-OX) 400 MG tablet Take 400 mg by mouth daily.   Yes [provider]  Melatonin 5 MG TABS Take 5 mg by mouth at bedtime.   Yes [provider]  Menthol, Topical Analgesic, (BIOFREEZE) 4 % GEL Apply 1 application topically 2 (two) times daily as needed (forehead and left arm pain).   Yes [provider]  metFORMIN (GLUCOPHAGE-XR) 500 MG 24 hr tablet Take 500 mg by mouth daily.   Yes [provider]  metoprolol succinate (TOPROL-XL) 25 MG 24 hr tablet Take 25 mg by mouth daily.   Yes [provider]  nitroGLYCERIN (NITROSTAT) 0.4 MG SL tablet Place 0.4 mg under the tongue every 5 (five) minutes as needed for chest pain.   Yes [provider]  polyethylene glycol (MIRALAX / GLYCOLAX) 17 g packet Take 17 g by mouth daily. Mix in 6 oz fluid and drink   Yes [provider]  Skin Protectants, Misc. (EUCERIN) cream Apply 1 application topically 2 (two) times a day. For itching   Yes [provider]  spironolactone (ALDACTONE) 25 MG tablet Take 12.5 mg by mouth daily.   Yes [provider]  tamsulosin (FLOMAX) 0.4 MG CAPS capsule Take  1 capsule (0.4 mg total) by mouth daily after supper. 12/25/18  Yes Kayleen Memos, DO  vitamin B-12 (CYANOCOBALAMIN) 1000 MCG tablet Take 1,000 mcg by mouth daily.   Yes [provider]    Allergies    Adhesive [tape]  Review of Systems   Review of Systems All other systems reviewed and are negative except that which was mentioned in HPI  Physical Exam Updated Vital Signs BP 130/77   Pulse (!) 58   Temp 98.1 F (36.7 C) (Oral)   Resp 18   Ht 5\' 11"  (1.803 m)   Wt (!) 145.2 kg   SpO2 95%   BMI 44.63 kg/m   Physical Exam Vitals and nursing note reviewed. Exam conducted with a chaperone present.  Constitutional:      General: He is not in acute distress.    Appearance: He is well-developed. He is obese.  HENT:     Head: Normocephalic and atraumatic.  Eyes:     Conjunctiva/sclera: Conjunctivae normal.  Cardiovascular:     Rate and Rhythm: Normal rate and regular rhythm.     Heart sounds: Normal heart sounds. No murmur heard.   Pulmonary:     Effort: Pulmonary effort is normal.     Comments: Faint crackles LLL, occasional expiratory wheeze RUL Abdominal:     General: Bowel sounds are normal. There is no distension.     Palpations: Abdomen is soft.     Tenderness: There is no abdominal tenderness.  Genitourinary:    Comments: Inverted penis, trace edema, no drainage, no significant scrotal swelling, no tenderness Musculoskeletal:     Cervical back: Neck supple.     Right lower leg: Edema present.     Left lower leg: Edema present.     Comments: B/l legs wrapped to above knees  Skin:    General: Skin is warm and dry.  Neurological:     Mental Status: He is alert and oriented to person, place, and time.     Comments: Fluent speech  Psychiatric:        Judgment: Judgment normal.     ED Results / Procedures / Treatments   Labs (all labs ordered are  listed, but only abnormal results are displayed) Labs Reviewed  URINALYSIS, ROUTINE W REFLEX MICROSCOPIC -  Abnormal; Notable for the following components:      Result Value   Hgb urine dipstick LARGE (*)    Nitrite POSITIVE (*)    Leukocytes,Ua MODERATE (*)    Bacteria, UA RARE (*)    All other components within normal limits  COMPREHENSIVE METABOLIC PANEL - Abnormal; Notable for the following components:   Glucose, Bld 204 (*)    BUN 34 (*)    Creatinine, Ser 2.11 (*)    Total Protein 6.2 (*)    Albumin 3.4 (*)    GFR, Estimated 31 (*)    All other components within normal limits  URINE CULTURE  CBC  BRAIN NATRIURETIC PEPTIDE    EKG None  Radiology DG Chest 2 View  Result Date: 05/03/2020 CLINICAL DATA:  10 pound weight gain.  Crackles in lungs EXAM: CHEST - 2 VIEW COMPARISON:  Radiograph 12/20/2018 FINDINGS: Low lung volumes. Exam is lordotic. No effusion, infiltrate or pneumothorax. Degenerative osteophytosis of the spine. IMPRESSION: Lordotic exam.  No acute cardiopulmonary findings. Electronically Signed   By: Suzy Bouchard M.D.   On: 05/03/2020 18:49    Procedures Procedures (including critical care time)  Medications Ordered in ED Medications  lidocaine (XYLOCAINE) 2 % jelly 1 application (1 application Urethral Given 05/03/20 1945)    ED Course  I have reviewed the triage vital signs and the nursing notes.  Pertinent labs & imaging results that were available during my care of the patient were reviewed by me and considered in my medical decision making (see chart for details).    MDM Rules/Calculators/A&P                          Normal VS, morbidly obese therefore difficult to assess volume status or acute urinary retention based on palpation alone.   CXR negative acute. Cr 2.11 which is close to baseline, normal WBC count. Nurse attempted foley, then Coude, without success. On repeat exam, I am unable to completely retract foreskin and cannot see any of the glans penis. This is concerning for phimosis which has caused acute urinary difficulty. Discussed w/  urology, Dr. Junious Silk, who evaluated pt and was able to place foley over guidewire w/ immediate relief of retention. Urine culture sent. Will have pt continue cipro that was started at facility and pt to f/u in urology clinic for eventual definitive management of phimosis. Final Clinical Impression(s) / ED Diagnoses Final diagnoses:  Phimosis of penis  Urinary retention    Rx / DC Orders ED Discharge Orders    None       Omolara Carol, Wenda Overland, MD 05/03/20 2249

## 2020-05-03 NOTE — ED Triage Notes (Signed)
Pt arrived via EMS from [facility]. Pt reports penis swelling, pain, and urine retention for the past 3 days. They attempted to do an in-and-out catheter at [facility] but were unable to do so.

## 2020-05-04 DIAGNOSIS — I480 Paroxysmal atrial fibrillation: Secondary | ICD-10-CM | POA: Diagnosis not present

## 2020-05-04 DIAGNOSIS — R41841 Cognitive communication deficit: Secondary | ICD-10-CM | POA: Diagnosis not present

## 2020-05-04 DIAGNOSIS — R2689 Other abnormalities of gait and mobility: Secondary | ICD-10-CM | POA: Diagnosis not present

## 2020-05-04 DIAGNOSIS — N184 Chronic kidney disease, stage 4 (severe): Secondary | ICD-10-CM | POA: Diagnosis not present

## 2020-05-04 DIAGNOSIS — M6259 Muscle wasting and atrophy, not elsewhere classified, multiple sites: Secondary | ICD-10-CM | POA: Diagnosis not present

## 2020-05-04 DIAGNOSIS — R278 Other lack of coordination: Secondary | ICD-10-CM | POA: Diagnosis not present

## 2020-05-04 DIAGNOSIS — R1312 Dysphagia, oropharyngeal phase: Secondary | ICD-10-CM | POA: Diagnosis not present

## 2020-05-04 DIAGNOSIS — N390 Urinary tract infection, site not specified: Secondary | ICD-10-CM | POA: Diagnosis not present

## 2020-05-04 DIAGNOSIS — R2681 Unsteadiness on feet: Secondary | ICD-10-CM | POA: Diagnosis not present

## 2020-05-04 DIAGNOSIS — Z7401 Bed confinement status: Secondary | ICD-10-CM | POA: Diagnosis not present

## 2020-05-04 DIAGNOSIS — G9341 Metabolic encephalopathy: Secondary | ICD-10-CM | POA: Diagnosis not present

## 2020-05-04 DIAGNOSIS — N139 Obstructive and reflux uropathy, unspecified: Secondary | ICD-10-CM | POA: Diagnosis not present

## 2020-05-04 DIAGNOSIS — R262 Difficulty in walking, not elsewhere classified: Secondary | ICD-10-CM | POA: Diagnosis not present

## 2020-05-04 DIAGNOSIS — Z9181 History of falling: Secondary | ICD-10-CM | POA: Diagnosis not present

## 2020-05-04 DIAGNOSIS — N471 Phimosis: Secondary | ICD-10-CM | POA: Diagnosis not present

## 2020-05-04 DIAGNOSIS — M2550 Pain in unspecified joint: Secondary | ICD-10-CM | POA: Diagnosis not present

## 2020-05-04 DIAGNOSIS — R310 Gross hematuria: Secondary | ICD-10-CM | POA: Diagnosis not present

## 2020-05-04 DIAGNOSIS — M6281 Muscle weakness (generalized): Secondary | ICD-10-CM | POA: Diagnosis not present

## 2020-05-04 DIAGNOSIS — R5381 Other malaise: Secondary | ICD-10-CM | POA: Diagnosis not present

## 2020-05-04 DIAGNOSIS — I499 Cardiac arrhythmia, unspecified: Secondary | ICD-10-CM | POA: Diagnosis not present

## 2020-05-04 DIAGNOSIS — J449 Chronic obstructive pulmonary disease, unspecified: Secondary | ICD-10-CM | POA: Diagnosis not present

## 2020-05-04 DIAGNOSIS — E119 Type 2 diabetes mellitus without complications: Secondary | ICD-10-CM | POA: Diagnosis not present

## 2020-05-05 DIAGNOSIS — R7981 Abnormal blood-gas level: Secondary | ICD-10-CM | POA: Diagnosis not present

## 2020-05-05 LAB — URINE CULTURE

## 2020-05-06 DIAGNOSIS — R310 Gross hematuria: Secondary | ICD-10-CM | POA: Diagnosis not present

## 2020-05-06 DIAGNOSIS — N471 Phimosis: Secondary | ICD-10-CM | POA: Diagnosis not present

## 2020-05-06 DIAGNOSIS — Z23 Encounter for immunization: Secondary | ICD-10-CM | POA: Diagnosis not present

## 2020-05-07 DIAGNOSIS — S0100XD Unspecified open wound of scalp, subsequent encounter: Secondary | ICD-10-CM | POA: Diagnosis not present

## 2020-05-11 DIAGNOSIS — M6259 Muscle wasting and atrophy, not elsewhere classified, multiple sites: Secondary | ICD-10-CM | POA: Diagnosis not present

## 2020-05-11 DIAGNOSIS — R262 Difficulty in walking, not elsewhere classified: Secondary | ICD-10-CM | POA: Diagnosis not present

## 2020-05-11 DIAGNOSIS — Z9181 History of falling: Secondary | ICD-10-CM | POA: Diagnosis not present

## 2020-05-11 DIAGNOSIS — R1312 Dysphagia, oropharyngeal phase: Secondary | ICD-10-CM | POA: Diagnosis not present

## 2020-05-11 DIAGNOSIS — R2689 Other abnormalities of gait and mobility: Secondary | ICD-10-CM | POA: Diagnosis not present

## 2020-05-11 DIAGNOSIS — N139 Obstructive and reflux uropathy, unspecified: Secondary | ICD-10-CM | POA: Diagnosis not present

## 2020-05-11 DIAGNOSIS — G9341 Metabolic encephalopathy: Secondary | ICD-10-CM | POA: Diagnosis not present

## 2020-05-11 DIAGNOSIS — R41841 Cognitive communication deficit: Secondary | ICD-10-CM | POA: Diagnosis not present

## 2020-05-11 DIAGNOSIS — J449 Chronic obstructive pulmonary disease, unspecified: Secondary | ICD-10-CM | POA: Diagnosis not present

## 2020-05-11 DIAGNOSIS — N184 Chronic kidney disease, stage 4 (severe): Secondary | ICD-10-CM | POA: Diagnosis not present

## 2020-05-11 DIAGNOSIS — R278 Other lack of coordination: Secondary | ICD-10-CM | POA: Diagnosis not present

## 2020-05-11 DIAGNOSIS — I480 Paroxysmal atrial fibrillation: Secondary | ICD-10-CM | POA: Diagnosis not present

## 2020-05-11 DIAGNOSIS — E119 Type 2 diabetes mellitus without complications: Secondary | ICD-10-CM | POA: Diagnosis not present

## 2020-05-11 DIAGNOSIS — R2681 Unsteadiness on feet: Secondary | ICD-10-CM | POA: Diagnosis not present

## 2020-05-11 DIAGNOSIS — M6281 Muscle weakness (generalized): Secondary | ICD-10-CM | POA: Diagnosis not present

## 2020-05-13 ENCOUNTER — Other Ambulatory Visit: Payer: Self-pay | Admitting: Urology

## 2020-05-13 ENCOUNTER — Telehealth: Payer: Self-pay | Admitting: Cardiology

## 2020-05-13 NOTE — Telephone Encounter (Signed)
Clinical pharmacist to review Eliquis 

## 2020-05-13 NOTE — Telephone Encounter (Signed)
Upcoming visit with Dr. Garen Lah for cardiac clearance. Will defer final clearance to MD

## 2020-05-13 NOTE — Telephone Encounter (Signed)
Patient with diagnosis of Eliquis on afib for anticoagulation.    Procedure: Dorsal Slit Circumcision Date of procedure: 05/28/20  CHA2DS2-VASc Score = 6  This indicates a 9.7% annual risk of stroke. The patient's score is based upon: CHF History: 1 HTN History: 1 Diabetes History: 1 Stroke History: 0 Vascular Disease History: 1 Age Score: 2 Gender Score: 0  CrCl 62mL/min using adjusted body weight Platelet count 174K  Per office protocol, patient can hold Eliquis for 2 days prior to procedure.

## 2020-05-13 NOTE — Telephone Encounter (Signed)
° °  [facility] Pre-operative Risk Assessment    HEARTCARE STAFF: - Please ensure there is not already an duplicate clearance open for this procedure. - Under Visit Info/Reason for Call, type in Other and utilize the format Clearance MM/DD/YY or Clearance TBD. Do not use dashes or single digits. - If request is for dental extraction, please clarify the # of teeth to be extracted.  Request for surgical clearance:  1. What type of surgery is being performed? Dorsal Slit Circumcision  2. When is this surgery scheduled?  05-28-20   3. What type of clearance is required (medical clearance vs. Pharmacy clearance to hold med vs. Both)?  both  4. Are there any medications that need to be held prior to surgery and how long? Eliquis 48 hrs prior    5. Practice name and name of physician performing surgery? [facility] Dr. Vernie Shanks  6. What is the office phone number? [phone]   7.   What is the office fax number?  [phone].  8.   Anesthesia type (None, local, MAC, general) ? MAC / Local   Clarisse Gouge 05/13/2020, 3:39 PM  _________________________________________________________________   (provider comments below)

## 2020-05-15 ENCOUNTER — Encounter (HOSPITAL_COMMUNITY): Payer: Self-pay | Admitting: Urology

## 2020-05-15 NOTE — Progress Notes (Addendum)
COVID Vaccine Completed:Yes Date COVID Vaccine completed: 07/08/2019, 08/05/2019 COVID vaccine manufacturer: Pfizer    Moderna     PCP - Dr. Charna Archer Cardiologist - Dr. Garen Lah last office visit note and cardiac clearance 05/19/20 in epic  Chest x-ray - 04/30/20 in epic EKG -  Stress Test - greater than 2years in epic ECHO - 12/20/2018 in epic Cardiac Cath -  Pacemaker/ICD device last checked:N/A  Sleep Study -  CPAP - Yes  Fasting Blood Sugar - 139-323 Checks Blood Sugar ___2__ times a day  Blood Thinner Instructions: Eliquis hold 2 days prior to procedure per note 05/13/20 in epic Aspirin Instructions: N/A Last Dose: N/A  Anesthesia review: CHF, OSA, COPD, His of MI, A.Fib, DM, CKD  Patient denies shortness of breath, fever, cough and chest pain at PAT appointment   Patient verbalized understanding of instructions that were given to them at the PAT appointment. Patient was also instructed that they will need to review over the PAT instructions again at home before surgery.

## 2020-05-15 NOTE — Patient Instructions (Addendum)
Preop instructions for:  Reginald [name]  Date of Birth:    1936/06/21                   Date of Procedure:   05/28/2020 Procedure:   DORSAL SLIT CIRCUMCISION  Surgeon: Dr. Franchot Gallo Facility contact: [facility]     Phone:   [place]: RN contact name/phone#:     Katy Apo                     and Fax #: [phone]   Transportation contact phone#: [facility]    [facility] to arrive at [facility]: 0945 AM   Report to: Admitting (On your left hand side)    Do not eat or drink past midnight the night before your procedure.(To include any tube feedings-must be discontinued)   Take these morning medications only with sips of water.(or give through gastrostomy or feeding tube). Tamsulosin, Toprol, Cipro, Oxybutynin Bring Asthma Inhaler day of surgery   Hold Eliquis 48 hours prior to surgery   Note: No Insulin or Diabetic meds should be given or taken the morning of the procedure!  Bring CPAP mask and tubing day of procedure   Please send day of procedure:current med list and meds last taken that day, confirm nothing by mouth status from what time, Patient Demographic info( to include DNR status, problem list, allergies)   Bring Insurance card and picture ID Leave all jewelry and other valuables at place where living( no metal or rings to be worn) No contact lens  Men-no colognes,lotions  How to Manage Your Diabetes Before and After Surgery  Why is it important to control my blood sugar before and after surgery? . Improving blood sugar levels before and after surgery helps healing and can limit problems. . A way of improving blood sugar control is eating a healthy diet by: o  Eating less sugar and carbohydrates o  Increasing activity/exercise o  Talking with your doctor about reaching your blood sugar goals . High blood sugars (greater than 180 mg/dL) can raise your risk of infections and slow your recovery, so you  will need to focus on controlling your diabetes during the weeks before surgery. . Make sure that the doctor who takes care of your diabetes knows about your planned surgery including the date and location.  How do I manage my blood sugar before surgery? . Check your blood sugar at least 4 times a day, starting 2 days before surgery, to make sure that the level is not too high or low. o Check your blood sugar the morning of your surgery when you wake up and every 2 hours until you get to the Short Stay unit. . If your blood sugar is less than 70 mg/dL, you will need to treat for low blood sugar: o Do not take insulin. o Treat a low blood sugar (less than 70 mg/dL) with  cup of clear juice (cranberry or apple), 4 glucose tablets, OR glucose gel. o Recheck blood sugar in 15 minutes after treatment (to make sure it is greater than 70 mg/dL). If your blood sugar is not greater than 70 mg/dL on recheck, call [phone] for further instructions. . Report your blood sugar to the short stay nurse when you get to Short Stay.  . If you are admitted to the hospital after surgery: o Your blood sugar  will be checked by the staff and you will probably be given insulin after surgery (instead of oral diabetes medicines) to make sure you have good blood sugar levels. o The goal for blood sugar control after surgery is 80-180 mg/dL.   WHAT DO I DO ABOUT MY DIABETES MEDICATION?  Marland Kitchen Do not take oral diabetes medicines (pills) the morning of surgery.     . The day of surgery, do not take other diabetes injectables, including Byetta (exenatide), Bydureon (exenatide ER), Victoza (liraglutide), or Trulicity (dulaglutide).  . If your CBG is greater than 220 mg/dL, you may take  of your sliding scale  . (correction) dose of insulin.   Any questions day of procedure,call  SHORT [phone]     Sent from :[facility]                   [phone]                   [phone]    Sent by :   Harlon Flor BSN            RN

## 2020-05-18 ENCOUNTER — Encounter (HOSPITAL_COMMUNITY): Payer: Self-pay | Admitting: Urology

## 2020-05-19 ENCOUNTER — Encounter: Payer: Self-pay | Admitting: Cardiology

## 2020-05-19 ENCOUNTER — Other Ambulatory Visit: Payer: Self-pay

## 2020-05-19 ENCOUNTER — Ambulatory Visit (INDEPENDENT_AMBULATORY_CARE_PROVIDER_SITE_OTHER): Payer: Medicare Other | Admitting: Cardiology

## 2020-05-19 ENCOUNTER — Encounter (HOSPITAL_COMMUNITY): Payer: Self-pay | Admitting: Urology

## 2020-05-19 VITALS — BP 118/68 | HR 86 | Ht 71.0 in | Wt 307.5 lb

## 2020-05-19 DIAGNOSIS — Z01818 Encounter for other preprocedural examination: Secondary | ICD-10-CM

## 2020-05-19 DIAGNOSIS — I5189 Other ill-defined heart diseases: Secondary | ICD-10-CM | POA: Diagnosis not present

## 2020-05-19 DIAGNOSIS — I4821 Permanent atrial fibrillation: Secondary | ICD-10-CM

## 2020-05-19 NOTE — Patient Instructions (Signed)
Medication Instructions:   Hold your Eliquis for 48 hours prior to your surgical procedure. Then restart immediately afterward as long as it is okay with your surgeon.   *If you need a refill on your cardiac medications before your next appointment, please call your pharmacy*   Lab Work: None Ordered If you have labs (blood work) drawn today and your tests are completely normal, you will receive your results only by: Marland Kitchen MyChart Message (if you have MyChart) OR . A paper copy in the mail If you have any lab test that is abnormal or we need to change your treatment, we will call you to review the results.   Testing/Procedures: None Ordered   Follow-Up: At [facility], you and your health needs are our priority.  As part of our continuing mission to provide you with exceptional heart care, we have created designated Provider Care Teams.  These Care Teams include your primary Cardiologist (physician) and Advanced Practice Providers (APPs -  Physician Assistants and Nurse Practitioners) who all work together to provide you with the care you need, when you need it.  We recommend signing up for the patient portal called "MyChart".  Sign up information is provided on this After Visit Summary.  MyChart is used to connect with patients for Virtual Visits (Telemedicine).  Patients are able to view lab/test results, encounter notes, upcoming appointments, etc.  Non-urgent messages can be sent to your provider as well.   To learn more about what you can do with MyChart, go to [email or web address].    Your next appointment:   1 year(s)  The format for your next appointment:   In Person  Provider:   Ida Rogue, MD   Other Instructions

## 2020-05-19 NOTE — Progress Notes (Signed)
Cardiology Office Note:    Date:  05/19/2020   ID:  Reginald [name], Reginald [name], Reginald [name]  PCP:  Reginald Spanish, MD  [facility] Cardiologist:  [name] Electrophysiologist:  None   Referring MD: Reginald Spanish, MD   Chief Complaint  Patient presents with  . New Patient (Initial Visit)    Ref by [facility], Dr. Vernie [name] for cardiac clearance for surgery. Pt. has a cardiac history and his on Eliquis which will need to be held prior to his surgery. Cardiac Hx. with Dr. Rockey [name] 3 years ago. Pt. c/o shortness of breath.     History of Present Illness:    Reginald [name] is a 84 y.o. male with a hx of CAD/PCI to LAD, [place] atrial fibrillation on Eliquis, hyperlipidemia, COPD, CKD being seen for preoperative cardiac risk stratification. 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 An indwelling Foley was placed and resection is being planned by urology. He is currently in a nursing home, was last seen in the office 2 years ago by Dr. Rockey [name]. He states having a fall, fracturing his [name] knee, currently wheelchair-bound due to this.   Last echocardiogram 12/2018 showed normal systolic function, EF 60 to 05%, grade 2 diastolic dysfunction. States doing okay, denies palpitations, chest pain, shortness of breath. He takes all his medications as prescribed.  Past Medical History:  Diagnosis Date  . Anteroseptal myocardial infarction ([place])   . CHF (congestive heart failure) ([place])   . Chronic kidney disease   . COPD (chronic obstructive pulmonary disease) ([place])   . Coronary atherosclerosis of unspecified type of vessel, native or graft    a. s/p prior PCI to LAD in 1997  . Diverticulosis   . Glaucoma   . History of 2019 novel coronavirus disease (COVID-19) 11/2018  . Hypercholesteremia   . Hypertensive renal disease   . Hypokalemia   . Obesity hypoventilation syndrome ([place])   . Obesity, unspecified   . OSA (obstructive sleep  apnea)   . Peripheral vascular disease ([place])   . Permanent atrial fibrillation ([place])   . Pneumonia   . Rhabdomyolysis   . Type II or unspecified type diabetes mellitus without mention of complication, not stated as uncontrolled   . Unspecified essential hypertension   . Vestibulopathy     Past Surgical History:  Procedure Laterality Date  . APPENDECTOMY  1985  . CARDIAC CATHETERIZATION    . CHOLECYSTECTOMY  06/2000  . CORONARY ANGIOPLASTY  08/14/1995   stent placement to LAD   . ENDOVENOUS ABLATION SAPHENOUS VEIN W/ LASER Left 02/21/2018   endovenous laser ablation L GSV by Reginald Hinds MD   . [place] [name] 1985  . KNEE ARTHROSCOPY Left 1991  . LUMBAR FUSION    . NOSE SURGERY  1970s   Per Dr. Terrence [name] [facility] in pt chart    Current Medications: Current Meds  Medication Sig  . acetaminophen (TYLENOL) 325 MG tablet Take 650 mg by mouth every 4 (four) hours as needed (pain).   Marland Kitchen albuterol (VENTOLIN HFA) 108 (90 Base) MCG/ACT inhaler Inhale 2 puffs into the lungs 2 (two) times daily as needed for wheezing or shortness of breath.  Marland Kitchen apixaban (ELIQUIS) 2.5 MG TABS tablet Take 1 tablet (2.5 mg total) by mouth 2 (two) times daily.  Marland Kitchen atorvastatin (LIPITOR) 20 MG tablet Take 20 mg by mouth at bedtime.   . cetirizine (ZYRTEC) 10 MG tablet Take 5 mg by mouth at  bedtime.   . cholecalciferol (VITAMIN D) 25 MCG (1000 UNIT) tablet Take 2,000 Units by mouth daily.  . ciprofloxacin (CIPRO) 250 MG tablet Take 250 mg by mouth 2 (two) times daily.  . clotrimazole (LOTRIMIN) 1 % cream Apply 1 application topically See admin instructions. Ordered 05/02/2020: Apply topically to head and gland of penis twice daily for 10 days  . docusate sodium (COLACE) 100 MG capsule Take 100 mg by mouth at bedtime.  . Dulaglutide (TRULICITY) 6.76 PP/5.0DT SOPN Inject 3 mg into the skin every Monday.   . gabapentin (NEURONTIN) 100 MG capsule Take 200 mg by mouth at bedtime.   . insulin lispro (HUMALOG)  100 UNIT/ML injection Inject 0-14 Units into the skin See admin instructions. Inject 0-14 units subcutaneously twice daily per sliding scale: CBG 0-200 0 units, 201-250 2 units, 251-300 4 units, 301-350 6 units, 351-400 8 units, 401-450 10 units, 451-500 12 units, >500 14 units  . latanoprost (XALATAN) 0.005 % ophthalmic solution Place 1 drop into both eyes at bedtime.  . magnesium oxide (MAG-OX) 400 MG tablet Take 400 mg by mouth daily.  . Melatonin 5 MG TABS Take 5 mg by mouth at bedtime.  . Menthol, Topical Analgesic, (BIOFREEZE) 4 % GEL Apply 1 application topically 2 (two) times daily as needed (forehead and left arm pain).  . metFORMIN (GLUCOPHAGE-XR) 500 MG 24 hr tablet Take 500 mg by mouth daily.  . metoprolol succinate (TOPROL-XL) 25 MG 24 hr tablet Take 25 mg by mouth daily.  . nitroGLYCERIN (NITROSTAT) 0.4 MG SL tablet Place 0.4 mg under the tongue every 5 (five) minutes as needed for chest pain.  . polyethylene glycol (MIRALAX / GLYCOLAX) 17 g packet Take 17 g by mouth daily. Mix in 6 oz fluid and drink  . Skin Protectants, Misc. (EUCERIN) cream Apply 1 application topically 2 (two) times a day. For itching  . spironolactone (ALDACTONE) 25 MG tablet Take 12.5 mg by mouth daily.  . tamsulosin (FLOMAX) 0.4 MG CAPS capsule Take 1 capsule (0.4 mg total) by mouth daily after supper.  . vitamin B-12 (CYANOCOBALAMIN) 1000 MCG tablet Take 1,000 mcg by mouth daily.     Allergies:   Adhesive [tape]   Social History   Socioeconomic History  . Marital status: Single    Spouse name: Not on file  . Number of children: 5  . Years of education: Not on file  . Highest education level: Not on file  Occupational History  . Occupation: retired  Tobacco Use  . Smoking status: Never Smoker  . Smokeless tobacco: Never Used  Vaping Use  . Vaping Use: Never used  Substance and Sexual Activity  . Alcohol use: No    Alcohol/week: 0.0 standard drinks  . Drug use: No  . Sexual activity: Never    Other Topics Concern  . Not on file  Social History Narrative  . Not on file   Social Determinants of Health   Financial Resource Strain:   . Difficulty of Paying Living Expenses: Not on file  Food Insecurity:   . Worried About Charity fundraiser in the Last Year: Not on file  . Ran Out of Food in the Last Year: Not on file  Transportation Needs:   . Lack of Transportation (Medical): Not on file  . Lack of Transportation (Non-Medical): Not on file  Physical Activity:   . Days of Exercise per Week: Not on file  . Minutes of Exercise per Session: Not on file  Stress:   . Feeling of Stress : Not on file  Social Connections:   . Frequency of Communication with Friends and Family: Not on file  . Frequency of Social Gatherings with Friends and Family: Not on file  . Attends Religious Services: Not on file  . Active Member of Clubs or Organizations: Not on file  . Attends Archivist Meetings: Not on file  . Marital Status: Not on file     Family History: The patient's family history includes Breast cancer in his maternal aunt; COPD in his mother; Colon cancer in his maternal aunt; Diabetes in his father, paternal grandmother, and sister; Glaucoma in an other family member; Heart disease in his brother, brother, father, and mother; Ovarian cancer in his daughter.  ROS:   Please see the history of present illness.     All other systems reviewed and are negative.  EKGs/Labs/Other Studies Reviewed:    The following studies were reviewed today:   EKG:  EKG is  ordered today.  The ekg ordered today demonstrates atrial fibrillation, heart rate 86  Recent Labs: 05/03/2020: ALT 25; BUN 34; Creatinine, Ser 2.11; Hemoglobin 16.5; Platelets 174; Potassium 4.8; Sodium 141  Recent Lipid Panel    Component Value Date/Time   CHOL  08/12/2010 0345    154        ATP III CLASSIFICATION:  <200     mg/dL   Desirable  200-239  mg/dL   Borderline High  >=240    mg/dL   High           TRIG 183 (H) 11/20/2018 0437   HDL 53 08/12/2010 0345   CHOLHDL 2.9 08/12/2010 0345   VLDL 20 08/12/2010 0345   LDLCALC  08/12/2010 0345    81        Total Cholesterol/HDL:CHD Risk Coronary Heart Disease Risk Table                     Men   Women  1/2 Average Risk   3.4   3.3  Average Risk       5.0   4.4  2 X Average Risk   9.6   7.1  3 X Average Risk  23.4   11.0        Use the calculated Patient Ratio above and the CHD Risk Table to determine the patient's CHD Risk.        ATP III CLASSIFICATION (LDL):  <100     mg/dL   Optimal  100-129  mg/dL   Near or Above                    Optimal  130-159  mg/dL   Borderline  160-189  mg/dL   High  >190     mg/dL   Very High     Risk Assessment/Calculations:      Physical Exam:    VS:  BP 118/68 (BP Location: [name] Arm, Patient Position: Sitting, Cuff Size: Large)   Pulse 86   Ht 5\' 11"  (1.803 m)   Wt (!) 307 lb 8 oz (139.5 kg)   SpO2 97%   BMI 42.89 kg/m     Wt Readings from Last 3 Encounters:  05/19/20 (!) 307 lb 8 oz (139.5 kg)  05/03/20 (!) 320 lb (145.2 kg)  12/21/18 (!) 328 lb 0.7 oz (148.8 kg)     GEN:  Well nourished, well developed in no acute distress HEENT: Normal,  NECK:  No JVD; No carotid bruits LYMPHATICS: No lymphadenopathy CARDIAC: Irregular irregular, nontachycardic RESPIRATORY: Poor inspiratory effort ABDOMEN: Soft, non-tender, distended, Foley catheter noted MUSCULOSKELETAL:  Trace edema; No deformity  SKIN: Warm and dry NEUROLOGIC:  Alert and oriented x 3 PSYCHIATRIC:  Normal affect   ASSESSMENT:    1. Pre-op evaluation   2. Permanent atrial fibrillation ([place])   3. Diastolic dysfunction    PLAN:    In order of problems listed above:  1. Patient presenting for preop evaluation. Currently asymptomatic. Urological procedures are typically deemed low risk from a cardiac perspective. Last echo with normal systolic function. Patient asymptomatic. Okay to proceed with urological procedure  without any additional cardiac testing. Can hold Eliquis 48 hours prior to procedure. Restart as soon as safe from a surgical perspective postop. 2. Permanent A. fib, heart rate controlled, CHA2DS2-VASc of 6 (chf, htn, dm, age, vasc) continue Toprol-XL, Eliquis 2.5mg  bid. Hold Eliquis for urological procedure as above. 3. History of heart failure preserved ejection fraction. Continue Toprol-XL, Aldactone. Does not appear fluid/ volume overloaded.  Follow-up in a year.   Shared Decision Making/Informed Consent      Medication Adjustments/Labs and Tests Ordered: Current medicines are reviewed at length with the patient today.  Concerns regarding medicines are outlined above.  Orders Placed This Encounter  Procedures  . EKG 12-Lead   No orders of the defined types were placed in this encounter.   Patient Instructions  Medication Instructions:   Hold your Eliquis for 48 hours prior to your surgical procedure. Then restart immediately afterward as long as it is okay with your surgeon.   *If you need a refill on your cardiac medications before your next appointment, please call your pharmacy*   Lab Work: None Ordered If you have labs (blood work) drawn today and your tests are completely normal, you will receive your results only by: Marland Kitchen MyChart Message (if you have MyChart) OR . A paper copy in the mail If you have any lab test that is abnormal or we need to change your treatment, we will call you to review the results.   Testing/Procedures: None Ordered   Follow-Up: At [facility], you and your health needs are our priority.  As part of our continuing mission to provide you with exceptional heart care, we have created designated Provider Care Teams.  These Care Teams include your primary Cardiologist (physician) and Advanced Practice Providers (APPs -  Physician Assistants and Nurse Practitioners) who all work together to provide you with the care you need, when you need  it.  We recommend signing up for the patient portal called "MyChart".  Sign up information is provided on this After Visit Summary.  MyChart is used to connect with patients for Virtual Visits (Telemedicine).  Patients are able to view lab/test results, encounter notes, upcoming appointments, etc.  Non-urgent messages can be sent to your provider as well.   To learn more about what you can do with MyChart, go to [email or web address].    Your next appointment:   1 year(s)  The format for your next appointment:   In Person  Provider:   Ida Rogue, MD   Other Instructions      Signed, Kate Sable, MD  05/19/2020 10:46 AM    [place]

## 2020-05-19 NOTE — Telephone Encounter (Signed)
Error

## 2020-05-20 DIAGNOSIS — N471 Phimosis: Secondary | ICD-10-CM | POA: Diagnosis not present

## 2020-05-20 DIAGNOSIS — H269 Unspecified cataract: Secondary | ICD-10-CM | POA: Diagnosis not present

## 2020-05-20 DIAGNOSIS — R310 Gross hematuria: Secondary | ICD-10-CM | POA: Diagnosis not present

## 2020-05-21 DIAGNOSIS — I480 Paroxysmal atrial fibrillation: Secondary | ICD-10-CM | POA: Diagnosis not present

## 2020-05-21 DIAGNOSIS — I5032 Chronic diastolic (congestive) heart failure: Secondary | ICD-10-CM | POA: Diagnosis not present

## 2020-05-21 DIAGNOSIS — C4490 Unspecified malignant neoplasm of skin, unspecified: Secondary | ICD-10-CM | POA: Diagnosis not present

## 2020-05-21 DIAGNOSIS — N471 Phimosis: Secondary | ICD-10-CM | POA: Diagnosis not present

## 2020-05-21 DIAGNOSIS — F0280 Dementia in other diseases classified elsewhere without behavioral disturbance: Secondary | ICD-10-CM | POA: Diagnosis not present

## 2020-05-21 DIAGNOSIS — I872 Venous insufficiency (chronic) (peripheral): Secondary | ICD-10-CM | POA: Diagnosis not present

## 2020-05-21 DIAGNOSIS — I2510 Atherosclerotic heart disease of native coronary artery without angina pectoris: Secondary | ICD-10-CM | POA: Diagnosis not present

## 2020-05-21 DIAGNOSIS — E118 Type 2 diabetes mellitus with unspecified complications: Secondary | ICD-10-CM | POA: Diagnosis not present

## 2020-05-21 DIAGNOSIS — N1832 Chronic kidney disease, stage 3b: Secondary | ICD-10-CM | POA: Diagnosis not present

## 2020-05-21 NOTE — Progress Notes (Signed)
Anesthesia Chart Review   Case: [id number] Date/Time: 05/28/20 1230   Procedures:      DORSAL SLIT (N/A ) - 21 MINS     CIRCUMCISION ADULT (N/A )   Anesthesia type: Monitor Anesthesia Care   Pre-op diagnosis: PHIMOSIS   Location: [place] / [facility]   Surgeons: Franchot Gallo, MD      DISCUSSION:84 y.o. never smoker with h/o DM II, HTN, OSA, CKD (creatinine stable, CAD (PCI to LAD), COPD, CHF, a-fib (on Eliquis), phimosis scheduled for above procedure 05/28/20 with Dr. Franchot Gallo.   Pt last seen by cardiology 05/19/20 for preoperative evaluation.  Per OV note, "Patient presenting for preop evaluation. Currently asymptomatic. Urological procedures are typically deemed low risk from a cardiac perspective. Last echo with normal systolic function. Patient asymptomatic. Okay to proceed with urological procedure without any additional cardiac testing. Can hold Eliquis 48 hours prior to procedure. Restart as soon as safe from a surgical perspective postop."  Anticipate pt can proceed with planned procedure barring acute status change.   VS: Ht 5\' 11"  (1.803 m)    Wt (!) 140.6 kg    BMI 43.24 kg/m   PROVIDERS: Guadlupe Spanish, MD is PCP   Kate Sable, MD is Cardiologist  LABS: labs DOS (all labs ordered are listed, but only abnormal results are displayed)  Labs Reviewed - No data to display   IMAGES:   EKG: 05/19/20 Rate 86 bpm  Atrial fibrillation  Low voltage QRS Septal infarct, age undetermined   CV: Echo 12/20/2018 IMPRESSIONS    1. The left ventricle has normal systolic function, with an ejection  fraction of 60-65%. There is moderately increased left ventricular wall  thickness. Left ventricular diastolic Doppler parameters are consistent  with pseudonormal. Elevated mean left  atrial pressure No evidence of left ventricular regional wall motion  abnormalities.  2. The mitral valve is degenerative. Mild thickening of the mitral valve   leaflet. Mild calcification of the mitral valve leaflet. There is moderate  mitral annular calcification present.  3. Tricuspid valve regurgitation is mild-moderate.  4. The aortic valve is tricuspid Mild thickening of the aortic valve Mild  calcification of the aortic valve.  5. The aortic root is normal in size and structure.  Past Medical History:  Diagnosis Date   Anteroseptal myocardial infarction [facility])    CHF (congestive heart failure) ([facility])    Chronic kidney disease    COPD (chronic obstructive pulmonary disease) ([facility])    Coronary atherosclerosis of unspecified type of vessel, native or graft    a. s/p prior PCI to LAD in 1997   Diverticulosis    ED (erectile dysfunction)    Foley catheter in place    Glaucoma    Hematuria    History of 2019 novel coronavirus disease (COVID-19) 11/2018   Hydronephrosis, bilateral    Hydroureter    Hypercholesteremia    Hypertensive renal disease    Hypogonadism male    Hypokalemia    Obesity hypoventilation syndrome ([facility])    Obesity, unspecified    OSA (obstructive sleep apnea)    Peripheral vascular disease ([facility])    Permanent atrial fibrillation ([facility])    Phimosis    Pneumonia    Rhabdomyolysis    Tremor    Type II or unspecified type diabetes mellitus without mention of complication, not stated as uncontrolled    Unspecified essential hypertension    Urinary incontinence    Urinary retention    Vestibulopathy  Past Surgical History:  Procedure Laterality Date   [place]  06/2000   CORONARY ANGIOPLASTY  08/14/1995   stent placement to LAD    ENDOVENOUS ABLATION SAPHENOUS VEIN W/ LASER Left 02/21/2018   endovenous laser ablation L GSV by Ruta Hinds MD    INGUINAL HERNIA REPAIR Right 1985   KNEE ARTHROSCOPY Left 1991   LUMBAR FUSION     NOSE SURGERY  1970s   Per Dr. Terrence Dupont [place] in pt chart    MEDICATIONS: No  current facility-administered medications for this encounter.    acetaminophen (TYLENOL) 325 MG tablet   albuterol (VENTOLIN HFA) 108 (90 Base) MCG/ACT inhaler   apixaban (ELIQUIS) 2.5 MG TABS tablet   atorvastatin (LIPITOR) 20 MG tablet   cetirizine (ZYRTEC) 10 MG tablet   cholecalciferol (VITAMIN D) 25 MCG (1000 UNIT) tablet   ciprofloxacin (CIPRO) 250 MG tablet   clotrimazole (LOTRIMIN) 1 % cream   docusate sodium (COLACE) 100 MG capsule   Dulaglutide (TRULICITY) 8.75 IE/3.3IR SOPN   gabapentin (NEURONTIN) 100 MG capsule   insulin lispro (HUMALOG) 100 UNIT/ML injection   latanoprost (XALATAN) 0.005 % ophthalmic solution   magnesium oxide (MAG-OX) 400 MG tablet   Melatonin 5 MG TABS   Menthol, Topical Analgesic, (BIOFREEZE) 4 % GEL   metFORMIN (GLUCOPHAGE-XR) 500 MG 24 hr tablet   metoprolol succinate (TOPROL-XL) 25 MG 24 hr tablet   nitroGLYCERIN (NITROSTAT) 0.4 MG SL tablet   polyethylene glycol (MIRALAX / GLYCOLAX) 17 g packet   Skin Protectants, Misc. (EUCERIN) cream   spironolactone (ALDACTONE) 25 MG tablet   tamsulosin (FLOMAX) 0.4 MG CAPS capsule   vitamin B-12 (CYANOCOBALAMIN) 1000 MCG tablet    Konrad Felix, PA-C [facility] Pre-Surgical Testing [phone]

## 2020-05-27 MED ORDER — DEXTROSE 5 % IV SOLN
3.0000 g | INTRAVENOUS | Status: AC
  Administered 2020-05-28: 3 g via INTRAVENOUS
  Filled 2020-05-27: qty 3

## 2020-05-27 NOTE — Progress Notes (Signed)
[facility] Note  05/29/2020     CHIEF COMPLAINT Patient presents for Retina Follow Up   HISTORY OF PRESENT ILLNESS: Reginald [name] is a 84 y.o. male who presents to the clinic today for:   HPI    Retina Follow Up    Patient presents with  CRVO/BRVO.  In right eye.  This started 4 months ago.  I, the attending physician,  performed the HPI with the patient and updated documentation appropriately.          Comments    Patient here for 4 months retina follow up for BRVO OD. Patient states vision can't see tv as good as like to. No eye pain.        Last edited by Bernarda Caffey, MD on 05/30/2020  1:49 AM. (History)    pt states he wants to get new glasses, he said right eye feels like there is a "grain of sand" in it   Referring physician: Guadlupe Spanish, MD [place],  [place] [address]  HISTORICAL INFORMATION:   Selected notes from the MEDICAL RECORD NUMBER Referred by Dr. Martinique DeMarco for concern of hollenhorst plaque OD LEE: 09.01.20 (J. DeMarco) [BCVA: OD: OS:] Ocular Hx-NPDR OU, PVD OD, CRVO with NV OD, PSC OS, cataracts OU, glaucoma OU PMH-CHF, CKD, COPD, HLD, HTN   CURRENT MEDICATIONS: Current Outpatient Medications (Ophthalmic Drugs)  Medication Sig  . latanoprost (XALATAN) 0.005 % ophthalmic solution Place 1 drop into both eyes at bedtime.   No current facility-administered medications for this visit. (Ophthalmic Drugs)   Current Outpatient Medications (Other)  Medication Sig  . acetaminophen (TYLENOL) 325 MG tablet Take 650 mg by mouth every 4 (four) hours as needed (pain).   Marland Kitchen albuterol (VENTOLIN HFA) 108 (90 Base) MCG/ACT inhaler Inhale 2 puffs into the lungs 2 (two) times daily as needed for wheezing or shortness of breath.  Marland Kitchen atorvastatin (LIPITOR) 40 MG tablet Take 40 mg by mouth at bedtime.   . cetirizine (ZYRTEC) 10 MG tablet Take 5 mg by mouth at bedtime.   . cholecalciferol (VITAMIN D) 25 MCG (1000 UNIT)  tablet Take 2,000 Units by mouth daily.  Marland Kitchen docusate sodium (COLACE) 100 MG capsule Take 100 mg by mouth at bedtime.  . Dulaglutide (TRULICITY) 3.53 GD/9.2EQ SOPN Inject 3 mg into the skin every Monday.   . gabapentin (NEURONTIN) 100 MG capsule Take 200 mg by mouth at bedtime.   . insulin lispro (HUMALOG) 100 UNIT/ML injection Inject 0-14 Units into the skin See admin instructions. Inject 0-14 units subcutaneously twice daily per sliding scale: CBG 0-200 0 units, 201-250 2 units, 251-300 4 units, 301-350 6 units, 351-400 8 units, 401-450 10 units, 451-500 12 units, >500 14 units  . magnesium oxide (MAG-OX) 400 MG tablet Take 400 mg by mouth daily.  . Melatonin 5 MG TABS Take 5 mg by mouth at bedtime.  . Menthol, Topical Analgesic, (BIOFREEZE) 4 % GEL Apply 1 application topically 2 (two) times daily as needed (forehead and left arm pain).  . metFORMIN (GLUCOPHAGE-XR) 750 MG 24 hr tablet Take 750 mg by mouth daily.   . metoprolol succinate (TOPROL-XL) 25 MG 24 hr tablet Take 25 mg by mouth daily.  . nitroGLYCERIN (NITROSTAT) 0.4 MG SL tablet Place 0.4 mg under the tongue every 5 (five) minutes as needed for chest pain.  Marland Kitchen oxybutynin (DITROPAN) 5 MG tablet Take 5 mg by mouth every 8 (eight) hours as needed for  bladder spasms.   . polyethylene glycol (MIRALAX / GLYCOLAX) 17 g packet Take 17 g by mouth daily. Mix in 6 oz fluid and drink  . Skin Protectants, Misc. (EUCERIN) cream Apply 1 application topically 2 (two) times a day. For itching  . spironolactone (ALDACTONE) 25 MG tablet Take 12.5 mg by mouth daily.  . tamsulosin (FLOMAX) 0.4 MG CAPS capsule Take 1 capsule (0.4 mg total) by mouth daily after supper.  . vitamin B-12 (CYANOCOBALAMIN) 1000 MCG tablet Take 1,000 mcg by mouth daily.   No current facility-administered medications for this visit. (Other)      REVIEW OF SYSTEMS: ROS    Positive for: Genitourinary, Cardiovascular, Eyes, Respiratory   Negative for: Constitutional,  Gastrointestinal, Neurological, Skin, Musculoskeletal, HENT, Endocrine, Psychiatric, Allergic/Imm, Heme/Lymph   Last edited by Theodore Demark, COA on 05/29/2020  1:14 PM. (History)       ALLERGIES Allergies  Allergen Reactions  . Adhesive [Tape] Rash    PAST MEDICAL HISTORY Past Medical History:  Diagnosis Date  . Anteroseptal myocardial infarction ([place])   . CHF (congestive heart failure) ([place])   . Chronic kidney disease   . COPD (chronic obstructive pulmonary disease) ([place])   . Coronary atherosclerosis of unspecified type of vessel, native or graft    a. s/p prior PCI to LAD in 1997  . Diverticulosis   . ED (erectile dysfunction)   . Foley catheter in place   . Glaucoma   . Hematuria   . History of 2019 novel coronavirus disease (COVID-19) 11/2018  . Hydronephrosis, bilateral   . Hydroureter   . Hypercholesteremia   . Hypertensive renal disease   . Hypogonadism male   . Hypokalemia   . Obesity hypoventilation syndrome ([place])   . Obesity, unspecified   . OSA (obstructive sleep apnea)   . Peripheral vascular disease ([place])   . Permanent atrial fibrillation ([place])   . Phimosis   . Pneumonia   . Rhabdomyolysis   . Tremor   . Type II or unspecified type diabetes mellitus without mention of complication, not stated as uncontrolled   . Unspecified essential hypertension   . Urinary incontinence   . Urinary retention   . Vestibulopathy    Past Surgical History:  Procedure Laterality Date  . APPENDECTOMY  1985  . CARDIAC CATHETERIZATION    . CHOLECYSTECTOMY  06/2000  . CIRCUMCISION N/A 05/28/2020   Procedure: CIRCUMCISION ADULT;  Surgeon: Franchot Gallo, MD;  Location: [facility];  Service: Urology;  Laterality: N/A;  . CORONARY ANGIOPLASTY  08/14/1995   stent placement to LAD   . DORSAL SLIT N/A 05/28/2020   Procedure: DORSAL SLIT;  Surgeon: Franchot Gallo, MD;  Location: [facility];  Service: Urology;  Laterality: N/A;  45 MINS  . ENDOVENOUS ABLATION SAPHENOUS  VEIN W/ LASER Left 02/21/2018   endovenous laser ablation L GSV by Ruta Hinds MD   . [place] Right 1985  . KNEE ARTHROSCOPY Left 1991  . LUMBAR FUSION    . NOSE SURGERY  1970s   Per Dr. Terrence Dupont PSH in pt chart    FAMILY HISTORY Family History  Problem Relation Age of Onset  . COPD Mother   . Heart disease Mother   . Diabetes Father   . Heart disease Father   . Diabetes Sister   . Heart disease Brother   . Heart disease Brother   . Breast cancer Maternal Aunt   . Diabetes Paternal Grandmother   . Colon cancer Maternal Aunt   .  Ovarian cancer Daughter   . Glaucoma Other     SOCIAL HISTORY Social History   Tobacco Use  . Smoking status: Never Smoker  . Smokeless tobacco: Never Used  Vaping Use  . Vaping Use: Never used  Substance Use Topics  . Alcohol use: No    Alcohol/week: 0.0 standard drinks  . Drug use: No         OPHTHALMIC EXAM:  Base Eye Exam    Visual Acuity (Snellen - Linear)      Right Left   Dist cc 20/50 -2 20/60 -2   Dist ph cc 20/40 -2 20/40       Tonometry (Tonopen, 1:11 PM)      Right Left   Pressure 22 22       Pupils      Dark Light Shape React APD   Right 3 2 Round Brisk None   Left 3 2 Round Brisk None       Visual Fields (Counting fingers)      Left Right    Full Full       Extraocular Movement      Right Left    Full, Ortho Full, Ortho       Neuro/Psych    Oriented x3: Yes   Mood/Affect: Normal       Dilation    Both eyes: 1.0% Mydriacyl, 2.5% Phenylephrine @ 1:11 PM        Slit Lamp and Fundus Exam    Slit Lamp Exam      Right Left   Lids/Lashes Dermatochalasis - upper lid, Meibomian gland dysfunction Dermatochalasis - upper lid, Meibomian gland dysfunction   Conjunctiva/Sclera White and quiet White and quiet   Cornea Arcus, 1+ Punctate epithelial erosions Arcus, 1+ inferior Punctate epithelial erosions   Anterior Chamber Deep and quiet, narrow temporal angle Deep and quiet, narrow  temporal angle   Iris Round and dilated, No NVI Round and dilated, No NVI   Lens 3+ Nuclear sclerosis with brunescence, 2-3+ Cortical cataract 3+ Nuclear sclerosis with brunescence, 2-3+ Cortical cataract   Vitreous mild Vitreous syneresis, Posterior vitreous detachment, weiss ring mild Vitreous syneresis, Posterior vitreous detachment       Fundus Exam      Right Left   Disc Pink and Sharp, +disc hemes/hyperemia nasally--persistent Pink and Sharp, temporal PPA, +cupping   C/D Ratio 0.6 0.55   Macula Flat, Blunted foveal reflex, RPE mottling, scattered CWS, no heme Flat, Blunted foveal reflex, rare MA, mild Retinal pigment epithelial mottling, focal IRH temporal to fovea   Vessels attenuated, Tortuous, mild Copper wiring attenuated, mild tortuousity   Periphery Attached, peripheral DBH superiorly, segmental PRP ST quad - good laser changes temporal periphery Attached, No heme         Refraction    Wearing Rx      Sphere Cylinder Axis Add   Right -0.50 +2.75 178 +2.75   Left -1.00 +3.00 007 +2.75   Type: Bifocal          IMAGING AND PROCEDURES  Imaging and Procedures for @TODAY @  OCT, Retina - OU - Both Eyes       Right Eye Quality was good. Central Foveal Thickness: 258. Progression has been stable. Findings include normal foveal contour, no IRF, no SRF (Mildly irregular lamination; trace ERM; diffuse retinal thinning).   Left Eye Quality was good. Central Foveal Thickness: 238. Progression has been stable. Findings include normal foveal contour, no IRF, no SRF.   Notes *Images  captured and stored on drive  Diagnosis / Impression:  NFP, no IRF/SRF OU OD: Mildly irregular lamination; trace ERM, diffuse retinal thinning  Clinical management:  See below  Abbreviations: NFP - Normal foveal profile. CME - cystoid macular edema. PED - pigment epithelial detachment. IRF - intraretinal fluid. SRF - subretinal fluid. EZ - ellipsoid zone. ERM - epiretinal membrane. ORA - outer  retinal atrophy. ORT - outer retinal tubulation. SRHM - subretinal hyper-reflective material        Fluorescein Angiography Optos (Transit OD)       Right Eye   Progression has been stable. Early phase findings include microaneurysm, vascular perfusion defect, staining, delayed filling (Delayed venous return; telangiectatic vessels superior macula). Mid/Late phase findings include microaneurysm, vascular perfusion defect, leakage, staining (Mild Perivascular leakage superior macula; old BRAO+BRVO).   Left Eye   Progression has been stable. Early phase findings include microaneurysm, normal observations. Mid/Late phase findings include microaneurysm, normal observations.   Notes Images stored on drive;   Impression: OD: remote superior BRVO+BRAO; mild late perivascular leakage superior macula -- stable from prior OS: peripheral staining otherwise normal study                  ASSESSMENT/PLAN:    ICD-10-CM   1. 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 Retinal macroaneurysm, right eye  H35.011   3. Moderate nonproliferative diabetic retinopathy of both eyes without macular edema associated with type 2 diabetes mellitus ([place])  H84.6962   4. Retinal edema  H35.81 OCT, Retina - OU - Both Eyes  5. Essential hypertension  I10   6. Hypertensive retinopathy of both eyes  H35.033 Fluorescein Angiography Optos (Transit OD)  7. Combined forms of age-related cataract of both eyes  H25.813     1,2. 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 Milus Height at [facility] -- will try to obtain records  - BCVA: OD: 20/40  - OCT shows mildly irregular lamination but no CME or IRF  - FA (09.15.20) shows superior retinal arterial macroaeurysm, telangectatic vessels in superior macula and capillary drop out temporal periphery with late perivascular leakage just proximal to temporal peripheral patch of  vascular nonperfusion  - s/p segmental PRP to areas of capillary drop out OD (09.30.20), fill-in (01.11.21) -- good fill-in  - FA 11.19.21 shows mild perivascular leakage superior macula -- no NV  - F/U 6-9 months, sooner prn  -- DFE/OCT  3. Moderate nonproliferative diabetic retinopathy w/o DME, OU  - exam shows scattered MA and IRH OU  - FA (09.15.20 and 11.19.21) shows scattered MA OU  - OCT without diabetic macular edema, OU  - monitor  4. No retinal edema on exam or OCT  5,6. Hypertensive retinopathy OU  - discussed importance of tight BP control  - monitor  7. Mixed form age related cataract OU  - The symptoms of cataract, surgical options, and treatments and risks were discussed with patient.  - discussed diagnosis and progression  - approaching visual significance  - will refer to [facility] for cataract eval   Ophthalmic Meds Ordered this visit:  No orders of the defined types were placed in this encounter.      Return for f/u 6-9 months, superior BRVO OD, DFE, OCT.  There are no Patient Instructions on file for this visit.   Explained the diagnoses, plan, and follow up with the patient and they expressed  understanding.  Patient expressed understanding of the importance of proper follow up care.   This document serves as a record of services personally performed by Gardiner Sleeper, MD, PhD. It was created on their behalf by San Jetty. Owens Shark, OA an ophthalmic technician. The creation of this record is the provider's dictation and/or activities during the visit.    Electronically signed by: San Jetty. Owens Shark, [place] 11.17.2021 1:53 AM  Gardiner Sleeper, M.D., Ph.D. Diseases & Surgery of the Retina and Vitreous [facility]  I have reviewed the above documentation for accuracy and completeness, and I agree with the above. Gardiner Sleeper, M.D., Ph.D. 05/30/20 1:53 AM   Abbreviations: M myopia (nearsighted); A astigmatism; H hyperopia  (farsighted); P presbyopia; Mrx spectacle prescription;  CTL contact lenses; OD right eye; OS left eye; OU both eyes  XT exotropia; ET esotropia; PEK punctate epithelial keratitis; PEE punctate epithelial erosions; DES dry eye syndrome; MGD meibomian gland dysfunction; ATs artificial tears; PFAT's preservative free artificial tears; [place] nuclear sclerotic cataract; PSC posterior subcapsular cataract; ERM epi-retinal membrane; PVD posterior vitreous detachment; RD retinal detachment; DM diabetes mellitus; DR diabetic retinopathy; NPDR non-proliferative diabetic retinopathy; PDR proliferative diabetic retinopathy; CSME clinically significant macular edema; DME diabetic macular edema; dbh dot blot hemorrhages; CWS cotton wool spot; POAG primary open angle glaucoma; C/D cup-to-disc ratio; HVF humphrey visual field; GVF goldmann visual field; OCT optical coherence tomography; IOP intraocular pressure; BRVO Branch retinal vein occlusion; CRVO central retinal vein occlusion; CRAO central retinal artery occlusion; BRAO branch retinal artery occlusion; RT retinal tear; SB scleral buckle; PPV pars plana vitrectomy; VH Vitreous hemorrhage; PRP panretinal laser photocoagulation; IVK intravitreal kenalog; VMT vitreomacular traction; MH Macular hole;  NVD neovascularization of the disc; NVE neovascularization elsewhere; AREDS age related eye disease study; ARMD age related macular degeneration; POAG primary open angle glaucoma; EBMD epithelial/anterior basement membrane dystrophy; ACIOL anterior chamber intraocular lens; IOL intraocular lens; PCIOL posterior chamber intraocular lens; Phaco/IOL phacoemulsification with intraocular lens placement; [place] photorefractive keratectomy; LASIK laser assisted in situ keratomileusis; HTN hypertension; DM diabetes mellitus; COPD chronic obstructive pulmonary disease

## 2020-05-28 ENCOUNTER — Encounter (HOSPITAL_COMMUNITY)
Admission: RE | Disposition: A | Discharge status: Rehabilitation Facility | Payer: Self-pay | Source: Home / Self Care | Attending: Urology

## 2020-05-28 ENCOUNTER — Ambulatory Visit (HOSPITAL_COMMUNITY): Payer: Medicare Other | Admitting: Physician Assistant

## 2020-05-28 ENCOUNTER — Encounter (HOSPITAL_COMMUNITY): Payer: Self-pay | Admitting: Urology

## 2020-05-28 ENCOUNTER — Other Ambulatory Visit: Payer: Self-pay

## 2020-05-28 ENCOUNTER — Ambulatory Visit (HOSPITAL_COMMUNITY)
Admission: RE | Admit: 2020-05-28 | Discharge: 2020-05-28 | Disposition: A | Discharge status: Rehabilitation Facility | Payer: Medicare Other | Attending: Urology | Admitting: Urology

## 2020-05-28 DIAGNOSIS — I2510 Atherosclerotic heart disease of native coronary artery without angina pectoris: Secondary | ICD-10-CM | POA: Diagnosis not present

## 2020-05-28 DIAGNOSIS — Z20822 Contact with and (suspected) exposure to covid-19: Secondary | ICD-10-CM | POA: Diagnosis not present

## 2020-05-28 DIAGNOSIS — N471 Phimosis: Secondary | ICD-10-CM | POA: Insufficient documentation

## 2020-05-28 DIAGNOSIS — I252 Old myocardial infarction: Secondary | ICD-10-CM | POA: Diagnosis not present

## 2020-05-28 DIAGNOSIS — I10 Essential (primary) hypertension: Secondary | ICD-10-CM | POA: Diagnosis not present

## 2020-05-28 DIAGNOSIS — Z23 Encounter for immunization: Secondary | ICD-10-CM | POA: Diagnosis not present

## 2020-05-28 HISTORY — DX: Tremor, unspecified: R25.1

## 2020-05-28 HISTORY — DX: Retention of urine, unspecified: R33.9

## 2020-05-28 HISTORY — DX: Hematuria, unspecified: R31.9

## 2020-05-28 HISTORY — DX: Testicular hypofunction: E29.1

## 2020-05-28 HISTORY — DX: Male erectile dysfunction, unspecified: N52.9

## 2020-05-28 HISTORY — DX: Presence of other specified devices: Z97.8

## 2020-05-28 HISTORY — PX: DORSAL SLIT: SHX6822

## 2020-05-28 HISTORY — DX: Peripheral vascular disease, unspecified: I73.9

## 2020-05-28 HISTORY — DX: Unspecified urinary incontinence: R32

## 2020-05-28 HISTORY — DX: Hydroureter: N13.4

## 2020-05-28 HISTORY — DX: Phimosis: N47.1

## 2020-05-28 HISTORY — DX: Unspecified hydronephrosis: N13.30

## 2020-05-28 HISTORY — PX: CIRCUMCISION: SHX1350

## 2020-05-28 HISTORY — DX: Pneumonia, unspecified organism: J18.9

## 2020-05-28 LAB — CBC
HCT: 51.7 % (ref 39.0–52.0)
Hemoglobin: 16.5 g/dL (ref 13.0–17.0)
MCH: 29.6 pg (ref 26.0–34.0)
MCHC: 31.9 g/dL (ref 30.0–36.0)
MCV: 92.7 fL (ref 80.0–100.0)
Platelets: 180 10*3/uL (ref 150–400)
RBC: 5.58 MIL/uL (ref 4.22–5.81)
RDW: 14 % (ref 11.5–15.5)
WBC: 7.6 10*3/uL (ref 4.0–10.5)
nRBC: 0 % (ref 0.0–0.2)

## 2020-05-28 LAB — RESP PANEL BY RT-PCR (FLU A&B, COVID) ARPGX2
Influenza A by PCR: NEGATIVE
Influenza B by PCR: NEGATIVE
SARS Coronavirus 2 by RT PCR: NEGATIVE

## 2020-05-28 LAB — BASIC METABOLIC PANEL
Anion gap: 10 (ref 5–15)
BUN: 26 mg/dL — ABNORMAL HIGH (ref 8–23)
CO2: 23 mmol/L (ref 22–32)
Calcium: 9.2 mg/dL (ref 8.9–10.3)
Chloride: 106 mmol/L (ref 98–111)
Creatinine, Ser: 1.67 mg/dL — ABNORMAL HIGH (ref 0.61–1.24)
GFR, Estimated: 41 mL/min — ABNORMAL LOW (ref >60)
Glucose, Bld: 204 mg/dL — ABNORMAL HIGH (ref 70–99)
Potassium: 4.4 mmol/L (ref 3.5–5.1)
Sodium: 139 mmol/L (ref 135–145)

## 2020-05-28 LAB — GLUCOSE, CAPILLARY: Glucose-Capillary: 215 mg/dL — ABNORMAL HIGH (ref 70–99)

## 2020-05-28 LAB — HEMOGLOBIN A1C
Hgb A1c MFr Bld: 8.1 % — ABNORMAL HIGH (ref 4.8–5.6)
Mean Plasma Glucose: 185.77 mg/dL

## 2020-05-28 SURGERY — DORSAL SLIT, PREPUCE
Anesthesia: Monitor Anesthesia Care

## 2020-05-28 MED ORDER — FENTANYL CITRATE (PF) 100 MCG/2ML IJ SOLN
INTRAMUSCULAR | Status: AC
  Filled 2020-05-28: qty 2

## 2020-05-28 MED ORDER — LACTATED RINGERS IV SOLN: INTRAVENOUS | Status: DC

## 2020-05-28 MED ORDER — CHLORHEXIDINE GLUCONATE 0.12 % MT SOLN
15.0000 mL | Freq: Once | OROMUCOSAL | Status: AC
  Administered 2020-05-28: 15 mL via OROMUCOSAL

## 2020-05-28 MED ORDER — LIDOCAINE 2% (20 MG/ML) 5 ML SYRINGE
INTRAMUSCULAR | Status: AC
  Filled 2020-05-28: qty 5

## 2020-05-28 MED ORDER — PROPOFOL 10 MG/ML IV BOLUS
INTRAVENOUS | Status: AC
  Filled 2020-05-28: qty 20

## 2020-05-28 MED ORDER — ONDANSETRON HCL 4 MG/2ML IJ SOLN: 4.0000 mg | Freq: Once | INTRAMUSCULAR | Status: DC | PRN

## 2020-05-28 MED ORDER — OXYCODONE HCL 5 MG PO TABS: 5.0000 mg | ORAL_TABLET | Freq: Once | ORAL | Status: DC | PRN

## 2020-05-28 MED ORDER — ONDANSETRON HCL 4 MG/2ML IJ SOLN
INTRAMUSCULAR | Status: AC
  Filled 2020-05-28: qty 2

## 2020-05-28 MED ORDER — OXYCODONE HCL 5 MG/5ML PO SOLN: 5.0000 mg | Freq: Once | ORAL | Status: DC | PRN

## 2020-05-28 MED ORDER — LIDOCAINE 2% (20 MG/ML) 5 ML SYRINGE
INTRAMUSCULAR | Status: DC | PRN
  Administered 2020-05-28: 40 mg via INTRAVENOUS

## 2020-05-28 MED ORDER — BUPIVACAINE HCL 0.25 % IJ SOLN
INTRAMUSCULAR | Status: AC
  Filled 2020-05-28: qty 1

## 2020-05-28 MED ORDER — ONDANSETRON HCL 4 MG/2ML IJ SOLN
INTRAMUSCULAR | Status: DC | PRN
  Administered 2020-05-28: 4 mg via INTRAVENOUS

## 2020-05-28 MED ORDER — BUPIVACAINE HCL (PF) 0.25 % IJ SOLN
INTRAMUSCULAR | Status: DC | PRN
  Administered 2020-05-28: 15 mL

## 2020-05-28 MED ORDER — ACETAMINOPHEN 325 MG PO TABS: 325.0000 mg | ORAL_TABLET | ORAL | Status: DC | PRN

## 2020-05-28 MED ORDER — FENTANYL CITRATE (PF) 100 MCG/2ML IJ SOLN: 25.0000 ug | INTRAMUSCULAR | Status: DC | PRN

## 2020-05-28 MED ORDER — LIDOCAINE HCL (PF) 1 % IJ SOLN
INTRAMUSCULAR | Status: AC
  Filled 2020-05-28: qty 30

## 2020-05-28 MED ORDER — ACETAMINOPHEN 160 MG/5ML PO SOLN: 325.0000 mg | ORAL | Status: DC | PRN

## 2020-05-28 MED ORDER — ORAL CARE MOUTH RINSE: 15.0000 mL | Freq: Once | OROMUCOSAL | Status: AC

## 2020-05-28 MED ORDER — PROPOFOL 10 MG/ML IV BOLUS
INTRAVENOUS | Status: DC | PRN
  Administered 2020-05-28 (×2): 10 mg via INTRAVENOUS

## 2020-05-28 MED ORDER — PROPOFOL 500 MG/50ML IV EMUL
INTRAVENOUS | Status: DC | PRN
  Administered 2020-05-28: 50 ug/kg/min via INTRAVENOUS

## 2020-05-28 MED ORDER — PROPOFOL 500 MG/50ML IV EMUL
INTRAVENOUS | Status: AC
  Filled 2020-05-28: qty 50

## 2020-05-28 MED ORDER — DEXAMETHASONE SODIUM PHOSPHATE 10 MG/ML IJ SOLN
INTRAMUSCULAR | Status: AC
  Filled 2020-05-28: qty 1

## 2020-05-28 MED ORDER — MEPERIDINE HCL 50 MG/ML IJ SOLN: 6.2500 mg | INTRAMUSCULAR | Status: DC | PRN

## 2020-05-28 SURGICAL SUPPLY — 24 items
BLADE SURG 15 STRL LF DISP TIS (BLADE) ×1 IMPLANT
BLADE SURG 15 STRL SS (BLADE) ×3
BNDG COHESIVE 1X5 TAN STRL LF (GAUZE/BANDAGES/DRESSINGS) ×3 IMPLANT
BNDG CONFORM 2 STRL LF (GAUZE/BANDAGES/DRESSINGS) ×3 IMPLANT
COVER SURGICAL LIGHT HANDLE (MISCELLANEOUS) ×3 IMPLANT
COVER WAND RF STERILE (DRAPES) IMPLANT
DRAPE LAPAROTOMY T 102X78X121 (DRAPES) ×3 IMPLANT
ELECT NDL TIP 2.8 STRL (NEEDLE) IMPLANT
ELECT NEEDLE TIP 2.8 STRL (NEEDLE) IMPLANT
ELECT REM PT RETURN 15FT ADLT (MISCELLANEOUS) ×3 IMPLANT
GAUZE 4X4 16PLY RFD (DISPOSABLE) ×3 IMPLANT
GAUZE PETROLATUM 1 X8 (GAUZE/BANDAGES/DRESSINGS) ×3 IMPLANT
GAUZE SPONGE 4X4 12PLY STRL (GAUZE/BANDAGES/DRESSINGS) ×2 IMPLANT
GAUZE VASELINE 3X9 (GAUZE/BANDAGES/DRESSINGS) ×2 IMPLANT
GLOVE BIOGEL M 8.0 STRL (GLOVE) ×3 IMPLANT
GOWN STRL REUS W/TWL XL LVL3 (GOWN DISPOSABLE) ×3 IMPLANT
KIT BASIN OR (CUSTOM PROCEDURE TRAY) ×3 IMPLANT
KIT TURNOVER KIT A (KITS) IMPLANT
NEEDLE HYPO 22GX1.5 SAFETY (NEEDLE) IMPLANT
NS IRRIG 1000ML POUR BTL (IV SOLUTION) IMPLANT
PACK BASIC VI WITH GOWN DISP (CUSTOM PROCEDURE TRAY) ×3 IMPLANT
PENCIL SMOKE EVACUATOR (MISCELLANEOUS) IMPLANT
SUT CHROMIC 4 0 RB 1X27 (SUTURE) ×6 IMPLANT
SYR CONTROL 10ML LL (SYRINGE) IMPLANT

## 2020-05-28 NOTE — Anesthesia Preprocedure Evaluation (Signed)
Anesthesia Evaluation  Patient identified by MRN, date of birth, ID band Patient awake    Reviewed: Allergy & Precautions, NPO status , Patient's Chart, lab work & pertinent test results  Airway Mallampati: II  TM Distance: >3 FB Neck ROM: Full    Dental no notable dental hx.    Pulmonary sleep apnea , COPD,    Pulmonary exam normal breath sounds clear to auscultation       Cardiovascular hypertension, + CAD and + Past MI  Normal cardiovascular exam+ dysrhythmias Atrial Fibrillation  Rhythm:Regular Rate:Normal     Neuro/Psych negative neurological ROS  negative psych ROS   GI/Hepatic negative GI ROS, Neg liver ROS,   Endo/Other  diabetesMorbid obesity  Renal/GU Renal InsufficiencyRenal disease  negative genitourinary   Musculoskeletal negative musculoskeletal ROS (+)   Abdominal   Peds negative pediatric ROS (+)  Hematology negative hematology ROS (+)   Anesthesia Other Findings   Reproductive/Obstetrics negative OB ROS                             Anesthesia Physical Anesthesia Plan  ASA: IV  Anesthesia Plan: MAC   Post-op Pain Management:    Induction: Intravenous  PONV Risk Score and Plan: 0  Airway Management Planned: Simple Face Mask  Additional Equipment:   Intra-op Plan:   Post-operative Plan:   Informed Consent: I have reviewed the patients History and Physical, chart, labs and discussed the procedure including the risks, benefits and alternatives for the proposed anesthesia with the patient or authorized representative who has indicated his/her understanding and acceptance.     Dental advisory given  Plan Discussed with: CRNA and Surgeon  Anesthesia Plan Comments:         Anesthesia Quick Evaluation

## 2020-05-28 NOTE — H&P (Signed)
H&P  Chief Complaint: Foreskin problems  History of Present Illness: 84 yo male presents for circumcision--he has a catheter in now d/t BOO from severe phimosis. We have discussed circumcision for mgmt of this--he desires to proceed.  Past Medical History:  Diagnosis Date  . Anteroseptal myocardial infarction ([place])   . CHF (congestive heart failure) ([place])   . Chronic kidney disease   . COPD (chronic obstructive pulmonary disease) ([place])   . Coronary atherosclerosis of unspecified type of vessel, native or graft    a. s/p prior PCI to LAD in 1997  . Diverticulosis   . ED (erectile dysfunction)   . Foley catheter in place   . Glaucoma   . Hematuria   . History of 2019 novel coronavirus disease (COVID-19) 11/2018  . Hydronephrosis, bilateral   . Hydroureter   . Hypercholesteremia   . Hypertensive renal disease   . Hypogonadism male   . Hypokalemia   . Obesity hypoventilation syndrome ([place])   . Obesity, unspecified   . OSA (obstructive sleep apnea)   . Peripheral vascular disease ([place])   . Permanent atrial fibrillation ([place])   . Phimosis   . Pneumonia   . Rhabdomyolysis   . Tremor   . Type II or unspecified type diabetes mellitus without mention of complication, not stated as uncontrolled   . Unspecified essential hypertension   . Urinary incontinence   . Urinary retention   . Vestibulopathy     Past Surgical History:  Procedure Laterality Date  . APPENDECTOMY  1985  . CARDIAC CATHETERIZATION    . CHOLECYSTECTOMY  06/2000  . CORONARY ANGIOPLASTY  08/14/1995   stent placement to LAD   . ENDOVENOUS ABLATION SAPHENOUS VEIN W/ LASER Left 02/21/2018   endovenous laser ablation L GSV by Ruta Hinds MD   . [place] Right 1985  . KNEE ARTHROSCOPY Left 1991  . LUMBAR FUSION    . NOSE SURGERY  1970s   Per Dr. Terrence Dupont [facility] in pt chart    Home Medications:    Allergies:  Allergies  Allergen Reactions  . Adhesive [Tape] Rash    Family History  Problem  Relation Age of Onset  . COPD Mother   . Heart disease Mother   . Diabetes Father   . Heart disease Father   . Diabetes Sister   . Heart disease Brother   . Heart disease Brother   . Breast cancer Maternal Aunt   . Diabetes Paternal Grandmother   . Colon cancer Maternal Aunt   . Ovarian cancer Daughter   . Glaucoma Other     Social History:  reports that he has never smoked. He has never used smokeless tobacco. He reports that he does not drink alcohol and does not use drugs.  ROS: A complete review of systems was performed.  All systems are negative except for pertinent findings as noted.  Physical Exam:  Vital signs in last 24 hours: BP 139/76   Pulse 76   Temp 98.1 F (36.7 C) (Oral)   Resp 20   Ht 5\' 11"  (1.803 m)   Wt (!) 140.8 kg   SpO2 96%   BMI 43.31 kg/m  Constitutional:  Alert and oriented, No acute distress Cardiovascular: Regular rate  Respiratory: Normal respiratory effort GI: Abdomen is soft, nontender, nondistended, no abdominal masses. No CVAT.  Genitourinary: Severe phimosis w/ catheter in place.. Lymphatic: No lymphadenopathy Neurologic: Grossly intact, no focal deficits Psychiatric: Normal mood and affect  Laboratory Data:  Recent  Labs    05/28/20 0921  WBC 7.6  HGB 16.5  HCT 51.7  PLT 180    Recent Labs    05/28/20 0921  NA 139  K 4.4  CL 106  GLUCOSE 204*  BUN 26*  CALCIUM 9.2  CREATININE 1.67*     Results for orders placed or performed during the hospital encounter of 05/28/20 (from the past 24 hour(s))  Basic metabolic panel per protocol     Status: Abnormal   Collection Time: 05/28/20  9:21 AM  Result Value Ref Range   Sodium 139 135 - 145 mmol/L   Potassium 4.4 3.5 - 5.1 mmol/L   Chloride 106 98 - 111 mmol/L   CO2 23 22 - 32 mmol/L   Glucose, Bld 204 (H) 70 - 99 mg/dL   BUN 26 (H) 8 - 23 mg/dL   Creatinine, Ser 1.67 (H) 0.61 - 1.24 mg/dL   Calcium 9.2 8.9 - 10.3 mg/dL   GFR, Estimated 41 (L) >60 mL/min   Anion gap  10 5 - 15  CBC per protocol     Status: None   Collection Time: 05/28/20  9:21 AM  Result Value Ref Range   WBC 7.6 4.0 - 10.5 K/uL   RBC 5.58 4.22 - 5.81 MIL/uL   Hemoglobin 16.5 13.0 - 17.0 g/dL   HCT 51.7 39 - 52 %   MCV 92.7 80.0 - 100.0 fL   MCH 29.6 26.0 - 34.0 pg   MCHC 31.9 30.0 - 36.0 g/dL   RDW 14.0 11.5 - 15.5 %   Platelets 180 150 - 400 K/uL   nRBC 0.0 0.0 - 0.2 %  Resp Panel by RT-PCR (Flu A&B, Covid) Nasopharyngeal Swab     Status: None   Collection Time: 05/28/20  9:23 AM   Specimen: Nasopharyngeal Swab; Nasopharyngeal(NP) swabs in vial transport medium  Result Value Ref Range   SARS Coronavirus 2 by RT PCR NEGATIVE NEGATIVE   Influenza A by PCR NEGATIVE NEGATIVE   Influenza B by PCR NEGATIVE NEGATIVE  Hemoglobin A1c per protocol     Status: Abnormal   Collection Time: 05/28/20  9:47 AM  Result Value Ref Range   Hgb A1c MFr Bld 8.1 (H) 4.8 - 5.6 %   Mean Plasma Glucose 185.77 mg/dL  Glucose, capillary     Status: Abnormal   Collection Time: 05/28/20 10:15 AM  Result Value Ref Range   Glucose-Capillary 215 (H) 70 - 99 mg/dL   Recent Results (from the past 240 hour(s))  Resp Panel by RT-PCR (Flu A&B, Covid) Nasopharyngeal Swab     Status: None   Collection Time: 05/28/20  9:23 AM   Specimen: Nasopharyngeal Swab; Nasopharyngeal(NP) swabs in vial transport medium  Result Value Ref Range Status   SARS Coronavirus 2 by RT PCR NEGATIVE NEGATIVE Final    Comment: (NOTE) SARS-CoV-2 target nucleic acids are NOT DETECTED.  The SARS-CoV-2 RNA is generally detectable in upper respiratory specimens during the acute phase of infection. The lowest concentration of SARS-CoV-2 viral copies this assay can detect is 138 copies/mL. A negative result does not preclude SARS-Cov-2 infection and should not be used as the sole basis for treatment or other patient management decisions. A negative result may occur with  improper specimen collection/handling, submission of specimen  other than nasopharyngeal swab, presence of viral mutation(s) within the areas targeted by this assay, and inadequate number of viral copies(<138 copies/mL). A negative result must be combined with clinical observations, patient history, and epidemiological  information. The expected result is Negative.  Fact Sheet for Patients:  EntrepreneurPulse.com.au  Fact Sheet for Healthcare Providers:  IncredibleEmployment.be  This test is no t yet approved or cleared by the [place] FDA and  has been authorized for detection and/or diagnosis of SARS-CoV-2 by FDA under an Emergency Use Authorization (EUA). This EUA will remain  in effect (meaning this test can be used) for the duration of the COVID-19 declaration under Section 564(b)(1) of the Act, 21 U.S.C.section 360bbb-3(b)(1), unless the authorization is terminated  or revoked sooner.       Influenza A by PCR NEGATIVE NEGATIVE Final   Influenza B by PCR NEGATIVE NEGATIVE Final    Comment: (NOTE) The Xpert Xpress SARS-CoV-2/FLU/RSV plus assay is intended as an aid in the diagnosis of influenza from Nasopharyngeal swab specimens and should not be used as a sole basis for treatment. Nasal washings and aspirates are unacceptable for Xpert Xpress SARS-CoV-2/FLU/RSV testing.  Fact Sheet for Patients: EntrepreneurPulse.com.au  Fact Sheet for Healthcare Providers: IncredibleEmployment.be  This test is not yet approved or cleared by the [place] FDA and has been authorized for detection and/or diagnosis of SARS-CoV-2 by FDA under an Emergency Use Authorization (EUA). This EUA will remain in effect (meaning this test can be used) for the duration of the COVID-19 declaration under Section 564(b)(1) of the Act, 21 U.S.C. section 360bbb-3(b)(1), unless the authorization is terminated or revoked.  Performed at [facility], [place] [address], [place], [place] [address]     Renal Function: Recent Labs    05/28/20 2355  CREATININE 1.67*   Estimated Creatinine Clearance: 49 mL/min (A) (by C-G formula based on SCr of 1.67 mg/dL (H)).  Radiologic Imaging: No results found.  Impression/Assessment:  Phimosis w/ BOO  Plan:  Circumcision

## 2020-05-28 NOTE — Transfer of Care (Signed)
Immediate Anesthesia Transfer of Care Note  Patient: Reginald [name]  Procedure(s) Performed: DORSAL SLIT (N/A ) CIRCUMCISION ADULT (N/A )  Patient Location: PACU  Anesthesia Type:MAC  Level of Consciousness: awake, drowsy and patient cooperative  Airway & Oxygen Therapy: Patient Spontanous Breathing and Patient connected to face mask oxygen  Post-op Assessment: Report given to RN and Post -op Vital signs reviewed and stable  Post vital signs: Reviewed and stable  Last Vitals:  Vitals Value Taken Time  BP 131/76 05/28/20 1340  Temp    Pulse 76 05/28/20 1341  Resp 19 05/28/20 1341  SpO2 98 % 05/28/20 1341  Vitals shown include unvalidated device data.  Last Pain:  Vitals:   05/28/20 0959  TempSrc: Oral  PainSc:       Patients Stated Pain Goal: 4 (97/53/00 5110)  Complications: No complications documented.

## 2020-05-28 NOTE — Anesthesia Procedure Notes (Signed)
Procedure Name: MAC Date/Time: 05/28/2020 12:55 PM Performed by: Eben Burow, CRNA Pre-anesthesia Checklist: Patient identified, Emergency Drugs available, Suction available, Patient being monitored and Timeout performed Oxygen Delivery Method: Simple face mask Placement Confirmation: positive ETCO2

## 2020-05-28 NOTE — Discharge Instructions (Signed)
Postoperative instructions for circumcision  Wound:  Remove the dressing the morning after surgery. In most cases your incision will have absorbable sutures that run along the course of your incision and will dissolve within the first 10-20 days. Some will fall out even earlier. Expect some redness as the sutures dissolved but this should occur only around the sutures. If there is generalized redness, especially with increasing pain or swelling, let us know. The penis will possibly get "black and blue" as the blood in the tissues spread. Sometimes the whole penis will turn colors. The black and blue is followed by a yellow and brown color. In time, all the discoloration will go away.  Diet:  You may return to your normal diet within 24 hours following your surgery. You may note some mild nausea and possibly vomiting the first 6-8 hours following surgery. This is usually due to the side effects of anesthesia, and will disappear quite soon. I would suggest clear liquids and a very light meal the first evening following your surgery.  Activity:  Your physical activity should be restricted the first 48 hours. During that time you should remain relatively inactive, moving about only when necessary. During the first 7-10 days following surgery he should avoid lifting any heavy objects (anything greater than 15 pounds), and avoid strenuous exercise. If you work, ask [place] specifically about your restrictions, both for work and home. We will write a note to your employer if needed.  Ice packs can be placed on and off over the penis for the first 48 hours to help relieve the pain and keep the swelling down. Frozen peas or corn in a ZipLock bag can be frozen, used and re-frozen. Fifteen minutes on and 15 minutes off is a reasonable schedule.  No sexual activity for 1 month.  Hygiene:  You may shower 24 hours after your surgery. Make sure wound is clean and dry afterwards. Tub bathing should be restricted  until the seventh day.  Medication:  You will be sent home with some type of pain medication. In many cases you will be sent home with a narcotic pain pill (Vicodin or Tylox). If the pain is not too bad, you may take either Tylenol (acetaminophen) or Advil (ibuprofen) which contain no narcotic agents, and might be tolerated a little better, with fewer side effects. If the pain medication you are sent home with does not control the pain, you will have to let us know. Some narcotic pain medications cannot be given or refilled by a phone call to a pharmacy.  Problems you should report to [place]:   Fever of 101.0 degrees Fahrenheit or greater.  Moderate or severe swelling under the skin incision or involving the scrotum.  Drug reaction such as hives, a rash, nausea or vomiting.   Difficulty voiding  It is okay to restart the Eliquis in 3 days.

## 2020-05-28 NOTE — Op Note (Signed)
Preoperative diagnosis: Severe phimosis  Postoperative diagnosis: Same  Principal procedure: Dorsal slit circumcision  Surgeon: Nadiyah Zeis  Anesthesia: Local with MAC  Complications: None  Estimated blood loss: Less than 25 mL  Specimen: None  Indications: 84 year old male with severe phimosis with recent presentation with bladder outlet obstruction.  Due to the phimosis Foley catheter placement was difficult and eventually urologic consult was requested.  Using cystoscopy, the catheter was adequately placed.  He is maintained a Foley catheter.  He presents at this time for dorsal slit circumcision as he is obese and sleep circumcision would more than likely produce a buried penis again.  He understands the procedure as well as risks and complications and desires to proceed.  Description of procedure: The patient was properly identified in the holding area.  He received preoperative IV antibiotics and was taken to the operating room where he was placed on the OR table in the supine position.  His Foley catheter was removed.  Sedation was administered, penis and scrotum were prepped and draped in proper timeout was performed.  I performed a dorsal penile block with quarter percent plain Marcaine, 10 mL.  8 mL was used to infiltrate the dorsal foreskin.  Curved hemostats were placed on the dorsal foreskin side-by-side, and the foreskin incised between these 2.  The foreskin was incised all the way to the proximal foreskin.  The clamps were removed.  I then sutured the resulting wound with a running 4-0 chromic placed in simple position.  With this, there was adequate hemostasis.  The glans was easily identified.  There was one small tear on the ventral frenular area which was closed with a single simple interrupted suture of 4-0 chromic.  At this point the procedure was terminated.  I placed a dressing as well as I could, as the patient's penis was somewhat buried.  Layered dressing of Vaseline  gauze, 2 inch Kerlix and Coban was then placed.  The catheter was not replaced.  At this point the procedure was terminated.  The patient was then taken to the PACU in stable condition having tolerated the procedure well.

## 2020-05-29 ENCOUNTER — Encounter (HOSPITAL_COMMUNITY): Payer: Self-pay | Admitting: Urology

## 2020-05-29 ENCOUNTER — Ambulatory Visit (INDEPENDENT_AMBULATORY_CARE_PROVIDER_SITE_OTHER): Payer: Medicare Other | Admitting: Ophthalmology

## 2020-05-29 DIAGNOSIS — H25813 Combined forms of age-related cataract, bilateral: Secondary | ICD-10-CM | POA: Diagnosis not present

## 2020-05-29 DIAGNOSIS — H35011 Changes in retinal vascular appearance, right eye: Secondary | ICD-10-CM

## 2020-05-29 DIAGNOSIS — I10 Essential (primary) hypertension: Secondary | ICD-10-CM

## 2020-05-29 DIAGNOSIS — H348312 Tributary (branch) retinal vein occlusion, right eye, stable: Secondary | ICD-10-CM | POA: Diagnosis not present

## 2020-05-29 DIAGNOSIS — H35033 Hypertensive retinopathy, bilateral: Secondary | ICD-10-CM

## 2020-05-29 DIAGNOSIS — H3581 Retinal edema: Secondary | ICD-10-CM | POA: Diagnosis not present

## 2020-05-29 DIAGNOSIS — E113393 Type 2 diabetes mellitus with moderate nonproliferative diabetic retinopathy without macular edema, bilateral: Secondary | ICD-10-CM | POA: Diagnosis not present

## 2020-05-30 ENCOUNTER — Encounter (INDEPENDENT_AMBULATORY_CARE_PROVIDER_SITE_OTHER): Payer: Self-pay | Admitting: Ophthalmology

## 2020-06-03 NOTE — Anesthesia Postprocedure Evaluation (Signed)
Anesthesia Post Note  Patient: Reginald [name]  Procedure(s) Performed: DORSAL SLIT (N/A ) CIRCUMCISION ADULT (N/A )     Patient location during evaluation: PACU Anesthesia Type: MAC Level of consciousness: awake and sedated Pain management: pain level controlled Vital Signs Assessment: post-procedure vital signs reviewed and stable Respiratory status: spontaneous breathing Cardiovascular status: stable Postop Assessment: no apparent nausea or vomiting Anesthetic complications: no   No complications documented.  Last Vitals:  Vitals:   05/28/20 1400 05/28/20 1415  BP: 133/70 125/87  Pulse: 69 79  Resp: 17 20  Temp:  36.7 C  SpO2: 96% 95%    Last Pain:  Vitals:   05/28/20 1415  TempSrc:   PainSc: 0-No pain                 Huston Foley

## 2020-06-09 ENCOUNTER — Other Ambulatory Visit: Payer: Self-pay

## 2020-06-09 ENCOUNTER — Inpatient Hospital Stay (HOSPITAL_COMMUNITY)
Admission: EM | Admit: 2020-06-09 | Discharge: 2020-06-13 | DRG: 981 | Disposition: A | Discharge status: Skilled Nursing Facility | Payer: Medicare Other | Attending: Internal Medicine | Admitting: Internal Medicine

## 2020-06-09 ENCOUNTER — Encounter (HOSPITAL_COMMUNITY): Payer: Self-pay | Admitting: Radiology

## 2020-06-09 ENCOUNTER — Emergency Department (HOSPITAL_COMMUNITY): Payer: Medicare Other

## 2020-06-09 DIAGNOSIS — I2510 Atherosclerotic heart disease of native coronary artery without angina pectoris: Secondary | ICD-10-CM | POA: Diagnosis present

## 2020-06-09 DIAGNOSIS — E559 Vitamin D deficiency, unspecified: Secondary | ICD-10-CM | POA: Diagnosis not present

## 2020-06-09 DIAGNOSIS — E876 Hypokalemia: Secondary | ICD-10-CM | POA: Diagnosis not present

## 2020-06-09 DIAGNOSIS — M6258 Muscle wasting and atrophy, not elsewhere classified, other site: Secondary | ICD-10-CM | POA: Diagnosis not present

## 2020-06-09 DIAGNOSIS — I5032 Chronic diastolic (congestive) heart failure: Secondary | ICD-10-CM | POA: Diagnosis present

## 2020-06-09 DIAGNOSIS — I872 Venous insufficiency (chronic) (peripheral): Secondary | ICD-10-CM | POA: Diagnosis present

## 2020-06-09 DIAGNOSIS — L309 Dermatitis, unspecified: Secondary | ICD-10-CM | POA: Diagnosis present

## 2020-06-09 DIAGNOSIS — Z79899 Other long term (current) drug therapy: Secondary | ICD-10-CM

## 2020-06-09 DIAGNOSIS — K5900 Constipation, unspecified: Secondary | ICD-10-CM | POA: Diagnosis present

## 2020-06-09 DIAGNOSIS — G4733 Obstructive sleep apnea (adult) (pediatric): Secondary | ICD-10-CM | POA: Diagnosis present

## 2020-06-09 DIAGNOSIS — Z8701 Personal history of pneumonia (recurrent): Secondary | ICD-10-CM

## 2020-06-09 DIAGNOSIS — I10 Essential (primary) hypertension: Secondary | ICD-10-CM | POA: Diagnosis present

## 2020-06-09 DIAGNOSIS — Z20822 Contact with and (suspected) exposure to covid-19: Secondary | ICD-10-CM | POA: Diagnosis present

## 2020-06-09 DIAGNOSIS — U071 COVID-19: Secondary | ICD-10-CM | POA: Diagnosis not present

## 2020-06-09 DIAGNOSIS — H5461 Unqualified visual loss, right eye, normal vision left eye: Secondary | ICD-10-CM | POA: Diagnosis present

## 2020-06-09 DIAGNOSIS — R262 Difficulty in walking, not elsewhere classified: Secondary | ICD-10-CM | POA: Diagnosis not present

## 2020-06-09 DIAGNOSIS — Z412 Encounter for routine and ritual male circumcision: Secondary | ICD-10-CM | POA: Diagnosis not present

## 2020-06-09 DIAGNOSIS — N184 Chronic kidney disease, stage 4 (severe): Secondary | ICD-10-CM | POA: Diagnosis present

## 2020-06-09 DIAGNOSIS — R1312 Dysphagia, oropharyngeal phase: Secondary | ICD-10-CM | POA: Diagnosis not present

## 2020-06-09 DIAGNOSIS — R410 Disorientation, unspecified: Secondary | ICD-10-CM | POA: Diagnosis not present

## 2020-06-09 DIAGNOSIS — I63231 Cerebral infarction due to unspecified occlusion or stenosis of right carotid arteries: Secondary | ICD-10-CM | POA: Diagnosis present

## 2020-06-09 DIAGNOSIS — I6782 Cerebral ischemia: Secondary | ICD-10-CM | POA: Diagnosis not present

## 2020-06-09 DIAGNOSIS — I4821 Permanent atrial fibrillation: Secondary | ICD-10-CM | POA: Diagnosis present

## 2020-06-09 DIAGNOSIS — I639 Cerebral infarction, unspecified: Secondary | ICD-10-CM | POA: Diagnosis not present

## 2020-06-09 DIAGNOSIS — Z794 Long term (current) use of insulin: Secondary | ICD-10-CM

## 2020-06-09 DIAGNOSIS — E1122 Type 2 diabetes mellitus with diabetic chronic kidney disease: Secondary | ICD-10-CM | POA: Diagnosis present

## 2020-06-09 DIAGNOSIS — I129 Hypertensive chronic kidney disease with stage 1 through stage 4 chronic kidney disease, or unspecified chronic kidney disease: Secondary | ICD-10-CM | POA: Diagnosis present

## 2020-06-09 DIAGNOSIS — I25118 Atherosclerotic heart disease of native coronary artery with other forms of angina pectoris: Secondary | ICD-10-CM | POA: Diagnosis not present

## 2020-06-09 DIAGNOSIS — I130 Hypertensive heart and chronic kidney disease with heart failure and stage 1 through stage 4 chronic kidney disease, or unspecified chronic kidney disease: Secondary | ICD-10-CM | POA: Diagnosis not present

## 2020-06-09 DIAGNOSIS — Z993 Dependence on wheelchair: Secondary | ICD-10-CM

## 2020-06-09 DIAGNOSIS — E119 Type 2 diabetes mellitus without complications: Secondary | ICD-10-CM

## 2020-06-09 DIAGNOSIS — I252 Old myocardial infarction: Secondary | ICD-10-CM

## 2020-06-09 DIAGNOSIS — H3411 Central retinal artery occlusion, right eye: Secondary | ICD-10-CM | POA: Diagnosis not present

## 2020-06-09 DIAGNOSIS — R498 Other voice and resonance disorders: Secondary | ICD-10-CM | POA: Diagnosis not present

## 2020-06-09 DIAGNOSIS — R278 Other lack of coordination: Secondary | ICD-10-CM | POA: Diagnosis not present

## 2020-06-09 DIAGNOSIS — Z7984 Long term (current) use of oral hypoglycemic drugs: Secondary | ICD-10-CM

## 2020-06-09 DIAGNOSIS — M6259 Muscle wasting and atrophy, not elsewhere classified, multiple sites: Secondary | ICD-10-CM | POA: Diagnosis not present

## 2020-06-09 DIAGNOSIS — E785 Hyperlipidemia, unspecified: Secondary | ICD-10-CM | POA: Diagnosis present

## 2020-06-09 DIAGNOSIS — I6503 Occlusion and stenosis of bilateral vertebral arteries: Secondary | ICD-10-CM | POA: Diagnosis not present

## 2020-06-09 DIAGNOSIS — Z9181 History of falling: Secondary | ICD-10-CM | POA: Diagnosis not present

## 2020-06-09 DIAGNOSIS — I6521 Occlusion and stenosis of right carotid artery: Secondary | ICD-10-CM | POA: Diagnosis not present

## 2020-06-09 DIAGNOSIS — R52 Pain, unspecified: Secondary | ICD-10-CM | POA: Diagnosis not present

## 2020-06-09 DIAGNOSIS — J449 Chronic obstructive pulmonary disease, unspecified: Secondary | ICD-10-CM | POA: Diagnosis present

## 2020-06-09 DIAGNOSIS — Z8249 Family history of ischemic heart disease and other diseases of the circulatory system: Secondary | ICD-10-CM

## 2020-06-09 DIAGNOSIS — M6281 Muscle weakness (generalized): Secondary | ICD-10-CM | POA: Diagnosis not present

## 2020-06-09 DIAGNOSIS — I152 Hypertension secondary to endocrine disorders: Secondary | ICD-10-CM | POA: Diagnosis present

## 2020-06-09 DIAGNOSIS — H268 Other specified cataract: Secondary | ICD-10-CM | POA: Diagnosis not present

## 2020-06-09 DIAGNOSIS — R41841 Cognitive communication deficit: Secondary | ICD-10-CM | POA: Diagnosis not present

## 2020-06-09 DIAGNOSIS — K5790 Diverticulosis of intestine, part unspecified, without perforation or abscess without bleeding: Secondary | ICD-10-CM | POA: Diagnosis present

## 2020-06-09 DIAGNOSIS — H409 Unspecified glaucoma: Secondary | ICD-10-CM | POA: Diagnosis present

## 2020-06-09 DIAGNOSIS — J3489 Other specified disorders of nose and nasal sinuses: Secondary | ICD-10-CM | POA: Diagnosis not present

## 2020-06-09 DIAGNOSIS — I480 Paroxysmal atrial fibrillation: Secondary | ICD-10-CM | POA: Diagnosis not present

## 2020-06-09 DIAGNOSIS — I110 Hypertensive heart disease with heart failure: Secondary | ICD-10-CM | POA: Diagnosis present

## 2020-06-09 DIAGNOSIS — G459 Transient cerebral ischemic attack, unspecified: Secondary | ICD-10-CM | POA: Diagnosis not present

## 2020-06-09 DIAGNOSIS — N139 Obstructive and reflux uropathy, unspecified: Secondary | ICD-10-CM | POA: Diagnosis not present

## 2020-06-09 DIAGNOSIS — G9389 Other specified disorders of brain: Secondary | ICD-10-CM | POA: Diagnosis not present

## 2020-06-09 DIAGNOSIS — I4891 Unspecified atrial fibrillation: Secondary | ICD-10-CM | POA: Diagnosis present

## 2020-06-09 DIAGNOSIS — Z833 Family history of diabetes mellitus: Secondary | ICD-10-CM

## 2020-06-09 DIAGNOSIS — Z955 Presence of coronary angioplasty implant and graft: Secondary | ICD-10-CM

## 2020-06-09 DIAGNOSIS — Z981 Arthrodesis status: Secondary | ICD-10-CM | POA: Diagnosis not present

## 2020-06-09 DIAGNOSIS — R402411 Glasgow coma scale score 13-15, in the field [EMT or ambulance]: Secondary | ICD-10-CM | POA: Diagnosis not present

## 2020-06-09 DIAGNOSIS — N1832 Chronic kidney disease, stage 3b: Secondary | ICD-10-CM | POA: Diagnosis not present

## 2020-06-09 DIAGNOSIS — E1322 Other specified diabetes mellitus with diabetic chronic kidney disease: Secondary | ICD-10-CM | POA: Diagnosis present

## 2020-06-09 DIAGNOSIS — E1151 Type 2 diabetes mellitus with diabetic peripheral angiopathy without gangrene: Secondary | ICD-10-CM | POA: Diagnosis present

## 2020-06-09 DIAGNOSIS — H547 Unspecified visual loss: Secondary | ICD-10-CM | POA: Diagnosis not present

## 2020-06-09 DIAGNOSIS — R2681 Unsteadiness on feet: Secondary | ICD-10-CM | POA: Diagnosis not present

## 2020-06-09 DIAGNOSIS — R29702 NIHSS score 2: Secondary | ICD-10-CM | POA: Diagnosis present

## 2020-06-09 DIAGNOSIS — I739 Peripheral vascular disease, unspecified: Secondary | ICD-10-CM | POA: Diagnosis not present

## 2020-06-09 DIAGNOSIS — M2550 Pain in unspecified joint: Secondary | ICD-10-CM | POA: Diagnosis not present

## 2020-06-09 DIAGNOSIS — Z8616 Personal history of COVID-19: Secondary | ICD-10-CM

## 2020-06-09 DIAGNOSIS — G464 Cerebellar stroke syndrome: Secondary | ICD-10-CM | POA: Diagnosis not present

## 2020-06-09 DIAGNOSIS — Z825 Family history of asthma and other chronic lower respiratory diseases: Secondary | ICD-10-CM

## 2020-06-09 DIAGNOSIS — G9341 Metabolic encephalopathy: Secondary | ICD-10-CM | POA: Diagnosis not present

## 2020-06-09 DIAGNOSIS — Z91048 Other nonmedicinal substance allergy status: Secondary | ICD-10-CM

## 2020-06-09 DIAGNOSIS — I6602 Occlusion and stenosis of left middle cerebral artery: Secondary | ICD-10-CM | POA: Diagnosis not present

## 2020-06-09 DIAGNOSIS — N471 Phimosis: Secondary | ICD-10-CM | POA: Diagnosis not present

## 2020-06-09 DIAGNOSIS — Z7401 Bed confinement status: Secondary | ICD-10-CM | POA: Diagnosis not present

## 2020-06-09 DIAGNOSIS — R2689 Other abnormalities of gait and mobility: Secondary | ICD-10-CM | POA: Diagnosis not present

## 2020-06-09 DIAGNOSIS — Z6841 Body Mass Index (BMI) 40.0 and over, adult: Secondary | ICD-10-CM | POA: Diagnosis not present

## 2020-06-09 DIAGNOSIS — I6389 Other cerebral infarction: Secondary | ICD-10-CM | POA: Diagnosis not present

## 2020-06-09 LAB — CBC WITH DIFFERENTIAL/PLATELET
Abs Immature Granulocytes: 0.02 10*3/uL (ref 0.00–0.07)
Basophils Absolute: 0.1 10*3/uL (ref 0.0–0.1)
Basophils Relative: 1 %
Eosinophils Absolute: 0.4 10*3/uL (ref 0.0–0.5)
Eosinophils Relative: 5 %
HCT: 52.2 % — ABNORMAL HIGH (ref 39.0–52.0)
Hemoglobin: 16.6 g/dL (ref 13.0–17.0)
Immature Granulocytes: 0 %
Lymphocytes Relative: 23 %
Lymphs Abs: 1.9 10*3/uL (ref 0.7–4.0)
MCH: 29.7 pg (ref 26.0–34.0)
MCHC: 31.8 g/dL (ref 30.0–36.0)
MCV: 93.4 fL (ref 80.0–100.0)
Monocytes Absolute: 0.7 10*3/uL (ref 0.1–1.0)
Monocytes Relative: 9 %
Neutro Abs: 5.1 10*3/uL (ref 1.7–7.7)
Neutrophils Relative %: 62 %
Platelets: 177 10*3/uL (ref 150–400)
RBC: 5.59 MIL/uL (ref 4.22–5.81)
RDW: 14.1 % (ref 11.5–15.5)
WBC: 8.3 10*3/uL (ref 4.0–10.5)
nRBC: 0 % (ref 0.0–0.2)

## 2020-06-09 LAB — COMPREHENSIVE METABOLIC PANEL
ALT: 32 U/L (ref 0–44)
AST: 28 U/L (ref 15–41)
Albumin: 3.8 g/dL (ref 3.5–5.0)
Alkaline Phosphatase: 60 U/L (ref 38–126)
Anion gap: 8 (ref 5–15)
BUN: 23 mg/dL (ref 8–23)
CO2: 29 mmol/L (ref 22–32)
Calcium: 9.6 mg/dL (ref 8.9–10.3)
Chloride: 101 mmol/L (ref 98–111)
Creatinine, Ser: 1.58 mg/dL — ABNORMAL HIGH (ref 0.61–1.24)
GFR, Estimated: 43 mL/min — ABNORMAL LOW (ref >60)
Glucose, Bld: 177 mg/dL — ABNORMAL HIGH (ref 70–99)
Potassium: 4.4 mmol/L (ref 3.5–5.1)
Sodium: 138 mmol/L (ref 135–145)
Total Bilirubin: 1 mg/dL (ref 0.3–1.2)
Total Protein: 6.6 g/dL (ref 6.5–8.1)

## 2020-06-09 LAB — CBG MONITORING, ED: Glucose-Capillary: 142 mg/dL — ABNORMAL HIGH (ref 70–99)

## 2020-06-09 LAB — RESP PANEL BY RT-PCR (FLU A&B, COVID) ARPGX2
Influenza A by PCR: NEGATIVE
Influenza B by PCR: NEGATIVE
SARS Coronavirus 2 by RT PCR: NEGATIVE

## 2020-06-09 LAB — SEDIMENTATION RATE: Sed Rate: 10 mm/hr (ref 0–16)

## 2020-06-09 LAB — C-REACTIVE PROTEIN: CRP: 1 mg/dL — ABNORMAL HIGH (ref <1.0)

## 2020-06-09 MED ORDER — ALBUTEROL SULFATE HFA 108 (90 BASE) MCG/ACT IN AERS
2.0000 | INHALATION_SPRAY | Freq: Two times a day (BID) | RESPIRATORY_TRACT | Status: DC | PRN
  Filled 2020-06-09: qty 6.7

## 2020-06-09 MED ORDER — ACETAMINOPHEN 325 MG PO TABS: 650.0000 mg | ORAL_TABLET | ORAL | Status: DC | PRN

## 2020-06-09 MED ORDER — TAMSULOSIN HCL 0.4 MG PO CAPS
0.4000 mg | ORAL_CAPSULE | Freq: Every day | ORAL | Status: DC
  Administered 2020-06-10 – 2020-06-12 (×3): 0.4 mg via ORAL
  Filled 2020-06-09 (×3): qty 1

## 2020-06-09 MED ORDER — ATORVASTATIN CALCIUM 40 MG PO TABS
40.0000 mg | ORAL_TABLET | Freq: Every day | ORAL | Status: DC
  Administered 2020-06-09 – 2020-06-12 (×4): 40 mg via ORAL
  Filled 2020-06-09 (×4): qty 1

## 2020-06-09 MED ORDER — MELATONIN 5 MG PO TABS
5.0000 mg | ORAL_TABLET | Freq: Every day | ORAL | Status: DC
  Administered 2020-06-09 – 2020-06-12 (×4): 5 mg via ORAL
  Filled 2020-06-09 (×4): qty 1

## 2020-06-09 MED ORDER — DOCUSATE SODIUM 100 MG PO CAPS
100.0000 mg | ORAL_CAPSULE | Freq: Every day | ORAL | Status: DC
  Administered 2020-06-09 – 2020-06-12 (×4): 100 mg via ORAL
  Filled 2020-06-09 (×4): qty 1

## 2020-06-09 MED ORDER — APIXABAN 2.5 MG PO TABS
2.5000 mg | ORAL_TABLET | Freq: Two times a day (BID) | ORAL | Status: DC
  Administered 2020-06-10: 2.5 mg via ORAL
  Filled 2020-06-09 (×3): qty 1

## 2020-06-09 MED ORDER — HYPROMELLOSE (GONIOSCOPIC) 2.5 % OP SOLN: 1.0000 [drp] | Freq: Four times a day (QID) | OPHTHALMIC | Status: DC

## 2020-06-09 MED ORDER — ACETAMINOPHEN 160 MG/5ML PO SOLN: 650.0000 mg | ORAL | Status: DC | PRN

## 2020-06-09 MED ORDER — STROKE: EARLY STAGES OF RECOVERY BOOK
Freq: Once | Status: AC
  Filled 2020-06-09: qty 1

## 2020-06-09 MED ORDER — ENOXAPARIN SODIUM 40 MG/0.4ML ~~LOC~~ SOLN: 40.0000 mg | SUBCUTANEOUS | Status: DC

## 2020-06-09 MED ORDER — METOPROLOL SUCCINATE ER 25 MG PO TB24
25.0000 mg | ORAL_TABLET | Freq: Every day | ORAL | Status: DC
  Administered 2020-06-10 – 2020-06-11 (×2): 25 mg via ORAL
  Filled 2020-06-09 (×2): qty 1

## 2020-06-09 MED ORDER — NITROGLYCERIN 0.4 MG SL SUBL: 0.4000 mg | SUBLINGUAL_TABLET | SUBLINGUAL | Status: DC | PRN

## 2020-06-09 MED ORDER — VITAMIN D 25 MCG (1000 UNIT) PO TABS
2000.0000 [IU] | ORAL_TABLET | Freq: Every day | ORAL | Status: DC
  Administered 2020-06-10 – 2020-06-13 (×3): 2000 [IU] via ORAL
  Filled 2020-06-09 (×5): qty 2

## 2020-06-09 MED ORDER — LATANOPROST 0.005 % OP SOLN
1.0000 [drp] | Freq: Every day | OPHTHALMIC | Status: DC
  Administered 2020-06-10 – 2020-06-12 (×3): 1 [drp] via OPHTHALMIC
  Filled 2020-06-09 (×3): qty 2.5

## 2020-06-09 MED ORDER — ACETAMINOPHEN 650 MG RE SUPP: 650.0000 mg | RECTAL | Status: DC | PRN

## 2020-06-09 MED ORDER — VITAMIN B-12 1000 MCG PO TABS
1000.0000 ug | ORAL_TABLET | Freq: Every day | ORAL | Status: DC
  Administered 2020-06-10 – 2020-06-13 (×3): 1000 ug via ORAL
  Filled 2020-06-09 (×4): qty 1

## 2020-06-09 MED ORDER — OXYBUTYNIN CHLORIDE 5 MG PO TABS
5.0000 mg | ORAL_TABLET | Freq: Three times a day (TID) | ORAL | Status: DC | PRN
  Filled 2020-06-09: qty 1

## 2020-06-09 MED ORDER — IOHEXOL 350 MG/ML SOLN
100.0000 mL | Freq: Once | INTRAVENOUS | Status: AC | PRN
  Administered 2020-06-09: 60 mL via INTRAVENOUS

## 2020-06-09 MED ORDER — POLYETHYLENE GLYCOL 3350 17 G PO PACK
17.0000 g | PACK | Freq: Every day | ORAL | Status: DC
  Administered 2020-06-11 – 2020-06-13 (×2): 17 g via ORAL
  Filled 2020-06-09 (×2): qty 1

## 2020-06-09 MED ORDER — INSULIN ASPART 100 UNIT/ML ~~LOC~~ SOLN
0.0000 [IU] | Freq: Three times a day (TID) | SUBCUTANEOUS | Status: DC
  Administered 2020-06-10 – 2020-06-11 (×4): 2 [IU] via SUBCUTANEOUS
  Administered 2020-06-11: 3 [IU] via SUBCUTANEOUS
  Administered 2020-06-11: 2 [IU] via SUBCUTANEOUS
  Administered 2020-06-12: 3 [IU] via SUBCUTANEOUS
  Administered 2020-06-12 – 2020-06-13 (×3): 5 [IU] via SUBCUTANEOUS
  Filled 2020-06-09: qty 0.09

## 2020-06-09 MED ORDER — MAGNESIUM OXIDE 400 (241.3 MG) MG PO TABS
400.0000 mg | ORAL_TABLET | Freq: Every day | ORAL | Status: DC
  Administered 2020-06-10 – 2020-06-13 (×3): 400 mg via ORAL
  Filled 2020-06-09 (×3): qty 1

## 2020-06-09 MED ORDER — LORATADINE 10 MG PO TABS
10.0000 mg | ORAL_TABLET | Freq: Every day | ORAL | Status: DC
  Administered 2020-06-10 – 2020-06-13 (×3): 10 mg via ORAL
  Filled 2020-06-09 (×3): qty 1

## 2020-06-09 MED ORDER — SENNOSIDES-DOCUSATE SODIUM 8.6-50 MG PO TABS: 1.0000 | ORAL_TABLET | Freq: Every evening | ORAL | Status: DC | PRN

## 2020-06-09 MED ORDER — SODIUM CHLORIDE (PF) 0.9 % IJ SOLN
INTRAMUSCULAR | Status: AC
  Filled 2020-06-09: qty 50

## 2020-06-09 MED ORDER — GABAPENTIN 100 MG PO CAPS
200.0000 mg | ORAL_CAPSULE | Freq: Every day | ORAL | Status: DC
  Administered 2020-06-09 – 2020-06-12 (×4): 200 mg via ORAL
  Filled 2020-06-09 (×4): qty 2

## 2020-06-09 MED ORDER — POLYVINYL ALCOHOL 1.4 % OP SOLN
1.0000 [drp] | Freq: Four times a day (QID) | OPHTHALMIC | Status: DC
  Administered 2020-06-10 – 2020-06-13 (×9): 1 [drp] via OPHTHALMIC
  Filled 2020-06-09 (×3): qty 15

## 2020-06-09 NOTE — ED Provider Notes (Signed)
[facility] DEPT Provider Note   CSN: [id number] Arrival date & time: 06/09/20  1212     History No chief complaint on file.   Reginald [name] is a 84 y.o. male.  84 yo M of right-sided vision loss.  This occurred spontaneously yesterday afternoon.  Painless.  Denied one-sided numbness or weakness denies difficulty with speech or swallowing.  Had a fall about a week ago.  Denies any significant injury.  Gets followed up by the ophthalmologist for cataracts and a retinal vein occlusion that is chronic.  He states that the right eye is just dark.  Whenever he closes his left eye he cannot see anything.  When he closes his right eye is full field of vision.   The history is provided by the patient.  Illness Severity:  Moderate Onset quality:  Sudden Duration:  24 hours Timing:  Constant Progression:  Unchanged Chronicity:  New Associated symptoms: no abdominal pain, no chest pain, no congestion, no diarrhea, no fever, no headaches, no myalgias, no rash, no shortness of breath and no vomiting        Past Medical History:  Diagnosis Date  . Anteroseptal myocardial infarction ([place])   . CHF (congestive heart failure) ([place])   . Chronic kidney disease   . COPD (chronic obstructive pulmonary disease) ([place])   . Coronary atherosclerosis of unspecified type of vessel, native or graft    a. s/p prior PCI to LAD in 1997  . Diverticulosis   . ED (erectile dysfunction)   . Foley catheter in place   . Glaucoma   . Hematuria   . History of 2019 novel coronavirus disease (COVID-19) 11/2018  . Hydronephrosis, bilateral   . Hydroureter   . Hypercholesteremia   . Hypertensive renal disease   . Hypogonadism male   . Hypokalemia   . Obesity hypoventilation syndrome ([place])   . Obesity, unspecified   . OSA (obstructive sleep apnea)   . Peripheral vascular disease ([place])   . Permanent atrial fibrillation ([place])   . Phimosis   . Pneumonia   . Rhabdomyolysis     . Tremor   . Type II or unspecified type diabetes mellitus without mention of complication, not stated as uncontrolled   . Unspecified essential hypertension   . Urinary incontinence   . Urinary retention   . Vestibulopathy     Patient Active Problem List   Diagnosis Date Noted  . Acute on chronic renal failure ([place]) 12/19/2018  . Acute renal failure superimposed on stage 4 chronic kidney disease ([place]) 12/19/2018  . AKI (acute kidney injury) ([place]) 11/26/2018  . HCAP (healthcare-associated pneumonia) 11/15/2018  . Acute respiratory disease due to COVID-19 virus 11/15/2018  . Chronic venous insufficiency 10/04/2017  . Venous stasis ulcers of both lower extremities ([place]) 10/04/2017  . Nonspecific chest pain 10/27/2016  . Morbid obesity ([place]) 05/02/2016  . Lymphedema 05/02/2016  . Abnormal laboratory test result 10/15/2015  . Cough 10/08/2015  . Hematuria 09/21/2015  . Hypokalemia 08/13/2015  . HTN (hypertension) 08/13/2015  . Multiple open wounds of lower leg 07/22/2015  . CKD (chronic kidney disease) stage 4, GFR 15-29 ml/min (HCC) 07/22/2015  . Cellulitis of leg, right 06/28/2015  . ARF (acute renal failure) ([place]) 06/28/2015  . Hypertensive heart disease with CHF (congestive heart failure) ([place]) 05/19/2015  . CAD (coronary artery disease), native coronary artery 05/19/2015  . Hyperlipidemia 05/19/2015  . Vitamin D deficiency 05/19/2015  . Pressure ulcer 05/16/2015  . Diastolic dysfunction with  chronic heart failure ([place]) 05/08/2015  . Blisters of multiple sites 05/08/2015  . Type 2 diabetes mellitus ([place])   . Obesity, unspecified   . OSA (obstructive sleep apnea)   . Atrial fibrillation ([place])   . Secondary DM with CKD stage 4 and hypertension ([place])   . Glaucoma   . Cellulitis 05/07/2015    Past Surgical History:  Procedure Laterality Date  . APPENDECTOMY  1985  . CARDIAC CATHETERIZATION    . CHOLECYSTECTOMY  06/2000  . CIRCUMCISION N/A 05/28/2020   Procedure:  CIRCUMCISION ADULT;  Surgeon: Franchot Gallo, MD;  Location: [facility]S;  Service: Urology;  Laterality: N/A;  . CORONARY ANGIOPLASTY  08/14/1995   stent placement to LAD   . DORSAL SLIT N/A 05/28/2020   Procedure: DORSAL SLIT;  Surgeon: Franchot Gallo, MD;  Location: [facility];  Service: Urology;  Laterality: N/A;  41 MINS  . ENDOVENOUS ABLATION SAPHENOUS VEIN W/ LASER Left 02/21/2018   endovenous laser ablation L GSV by Ruta Hinds MD   . [place] Right 1985  . KNEE ARTHROSCOPY Left 1991  . LUMBAR FUSION    . NOSE SURGERY  1970s   Per Dr. Terrence Dupont [facility] in pt chart       Family History  Problem Relation Age of Onset  . COPD Mother   . Heart disease Mother   . Diabetes Father   . Heart disease Father   . Diabetes Sister   . Heart disease Brother   . Heart disease Brother   . Breast cancer Maternal Aunt   . Diabetes Paternal Grandmother   . Colon cancer Maternal Aunt   . Ovarian cancer Daughter   . Glaucoma Other     Social History   Tobacco Use  . Smoking status: Never Smoker  . Smokeless tobacco: Never Used  Vaping Use  . Vaping Use: Never used  Substance Use Topics  . Alcohol use: No    Alcohol/week: 0.0 standard drinks  . Drug use: No    Home Medications Prior to Admission medications   Medication Sig Start Date End Date Taking? Authorizing Provider  acetaminophen (TYLENOL) 325 MG tablet Take 650 mg by mouth every 4 (four) hours as needed (pain).     [provider]  albuterol (VENTOLIN HFA) 108 (90 Base) MCG/ACT inhaler Inhale 2 puffs into the lungs 2 (two) times daily as needed for wheezing or shortness of breath.    [provider]  atorvastatin (LIPITOR) 40 MG tablet Take 40 mg by mouth at bedtime.     [provider]  cetirizine (ZYRTEC) 10 MG tablet Take 5 mg by mouth at bedtime.     [provider]  cholecalciferol (VITAMIN D) 25 MCG (1000 UNIT) tablet Take 2,000 Units by mouth daily.    [provider]  docusate sodium (COLACE) 100 MG capsule Take 100 mg by mouth at bedtime.    [provider]  Dulaglutide (TRULICITY) 5.00 BB/0.4UG SOPN Inject 3 mg into the skin every Monday.     [provider]  gabapentin (NEURONTIN) 100 MG capsule Take 200 mg by mouth at bedtime.     [provider]  insulin lispro (HUMALOG) 100 UNIT/ML injection Inject 0-14 Units into the skin See admin instructions. Inject 0-14 units subcutaneously twice daily per sliding scale: CBG 0-200 0 units, 201-250 2 units, 251-300 4 units, 301-350 6 units, 351-400 8 units, 401-450 10 units, 451-500 12 units, >500 14 units    [provider]  latanoprost (XALATAN) 0.005 % ophthalmic solution Place 1 drop into both eyes at bedtime. 01/03/17   [provider]  magnesium oxide (MAG-OX) 400 MG tablet Take 400 mg by mouth daily.    [provider]  Melatonin 5 MG TABS Take 5 mg by mouth at bedtime.    [provider]  Menthol, Topical Analgesic, (BIOFREEZE) 4 % GEL Apply 1 application topically 2 (two) times daily as needed (forehead and left arm pain).    [provider]  metFORMIN (GLUCOPHAGE-XR) 750 MG 24 hr tablet Take 750 mg by mouth daily.     [provider]  metoprolol succinate (TOPROL-XL) 25 MG 24 hr tablet Take 25 mg by mouth daily.    [provider]  nitroGLYCERIN (NITROSTAT) 0.4 MG SL tablet Place 0.4 mg under the tongue every 5 (five) minutes as needed for chest pain.    [provider]  oxybutynin (DITROPAN) 5 MG tablet Take 5 mg by mouth every 8 (eight) hours as needed for bladder spasms.  05/07/20   [provider]  polyethylene glycol (MIRALAX / GLYCOLAX) 17 g packet Take 17 g by mouth daily. Mix in 6 oz fluid and drink    [provider]  Skin Protectants, Misc. (EUCERIN) cream Apply 1 application topically 2 (two) times a day. For itching    [provider]  spironolactone (ALDACTONE)  25 MG tablet Take 12.5 mg by mouth daily.    [provider]  tamsulosin (FLOMAX) 0.4 MG CAPS capsule Take 1 capsule (0.4 mg total) by mouth daily after supper. 12/25/18   Kayleen Memos, DO  vitamin B-12 (CYANOCOBALAMIN) 1000 MCG tablet Take 1,000 mcg by mouth daily.    [provider]    Allergies    Adhesive [tape]  Review of Systems   Review of Systems  Constitutional: Negative for chills and fever.  HENT: Negative for congestion and facial swelling.   Eyes: Positive for visual disturbance. Negative for discharge.  Respiratory: Negative for shortness of breath.   Cardiovascular: Negative for chest pain and palpitations.  Gastrointestinal: Negative for abdominal pain, diarrhea and vomiting.  Musculoskeletal: Negative for arthralgias and myalgias.  Skin: Negative for color change and rash.  Neurological: Negative for tremors, syncope and headaches.  Psychiatric/Behavioral: Negative for confusion and dysphoric mood.    Physical Exam Updated Vital Signs BP (!) 145/89   Pulse 68   Temp 97.9 F (36.6 C) (Oral)   Resp 19   Ht '5\' 11"'  (1.803 m)   Wt (!) 140.8 kg   SpO2 93%   BMI 43.31 kg/m   Physical Exam Vitals and nursing note reviewed.  Constitutional:      Appearance: He is well-developed.  HENT:     Head: Normocephalic and atraumatic.  Eyes:     Pupils: Pupils are equal, round, and reactive to light.     Funduscopic exam:    Right eye: No papilledema.     Comments: ? Pale macula  Neck:     Vascular: No JVD.  Cardiovascular:     Rate and Rhythm: Normal rate and regular rhythm.     Heart sounds: No murmur heard.  No friction rub. No gallop.   Pulmonary:     Effort: No respiratory distress.     Breath sounds: No wheezing.  Abdominal:     General: There is no distension.     Tenderness: There is no guarding or rebound.  Musculoskeletal:        General:  Normal range of motion.     Cervical back: Normal range of motion and neck supple.  Skin:     Coloration: Skin is not pale.     Findings: No rash.  Neurological:     Mental Status: He is alert and oriented to person, place, and time.     GCS: GCS eye subscore is 4. GCS verbal subscore is 5. GCS motor subscore is 6.     Cranial Nerves: Cranial nerves are intact.     Sensory: Sensation is intact.     Motor: Motor function is intact.     Coordination: Coordination is intact.  Psychiatric:        Behavior: Behavior normal.     ED Results / Procedures / Treatments   Labs (all labs ordered are listed, but only abnormal results are displayed) Labs Reviewed  CBC WITH DIFFERENTIAL/PLATELET - Abnormal; Notable for the following components:      Result Value   HCT 52.2 (*)    All other components within normal limits  C-REACTIVE PROTEIN - Abnormal; Notable for the following components:   CRP 1.0 (*)    All other components within normal limits  COMPREHENSIVE METABOLIC PANEL - Abnormal; Notable for the following components:   Glucose, Bld 177 (*)    Creatinine, Ser 1.58 (*)    GFR, Estimated 43 (*)    All other components within normal limits  SEDIMENTATION RATE    EKG None  Radiology MR BRAIN WO CONTRAST  Result Date: 06/09/2020 CLINICAL DATA:  Right eye vision loss EXAM: MRI HEAD AND ORBITS WITHOUT CONTRAST TECHNIQUE: Multiplanar, multiecho pulse sequences of the brain and surrounding structures were obtained without intravenous contrast. Multiplanar, multiecho pulse sequences of the orbits and surrounding structures were obtained including fat saturation techniques, without intravenous contrast administration. COMPARISON:  2019 FINDINGS: MRI HEAD FINDINGS Brain: A punctate focus of diffusion hyperintensity with ADC isointensity is present along the right precentral gyrus. Prominence of the ventricles and sulci reflects generalized parenchymal volume loss similar to the prior study. There is a small chronic right occipital infarct. Other patchy and confluent areas of T2  hyperintensity in the supratentorial white matter nonspecific but probably reflect similar moderate chronic microvascular ischemic changes. There are chronic small vessel infarcts and/or prominent perivascular spaces of the right centrum semiovale and bilateral basal ganglia. Few small scattered foci of susceptibility hypointensity likely reflecting chronic microhemorrhages. More prominent susceptibility in the parasagittal right parietal region could also reflect a calcified meningioma, noting possible extra-axial lesion along the falx on series 14, image 9. There is no intracranial mass or mass effect. No extra-axial collection. Vascular: Major vessel flow voids at the skull base are preserved. Skull and upper cervical spine: Marrow signal is within normal limits. Other: Minimal patchy fluid opacification of the mastoids. New nonspecific abnormal signal along the high left frontoparietal scalp (series 13, image 17). MRI ORBITS FINDINGS Orbits: No proptosis. No intraorbital mass. Globes, extraocular muscles, and lacrimal glands are symmetric and unremarkable. Optic nerve sheath complexes appear unremarkable on this noncontrast study. Visualized sinuses: Mild mucosal thickening. Soft tissues: Unremarkable. IMPRESSION: Probable punctate subacute infarct of the right precentral gyrus. Small chronic right occipital infarct. Moderate chronic microvascular ischemic changes. Question of a possible small calcified meningioma along the right falx. New nonspecific abnormal signal along the high left frontoparietal scalp (series 13, image 17). Correlate for scalp lesion on exam. Electronically Signed   By: Macy Mis M.D.   On: 06/09/2020 15:21   MR ORBITS  WO CONTRAST  Result Date: 06/09/2020 CLINICAL DATA:  Right eye vision loss EXAM: MRI HEAD AND ORBITS WITHOUT CONTRAST TECHNIQUE: Multiplanar, multiecho pulse sequences of the brain and surrounding structures were obtained without intravenous contrast. Multiplanar,  multiecho pulse sequences of the orbits and surrounding structures were obtained including fat saturation techniques, without intravenous contrast administration. COMPARISON:  2019 FINDINGS: MRI HEAD FINDINGS Brain: A punctate focus of diffusion hyperintensity with ADC isointensity is present along the right precentral gyrus. Prominence of the ventricles and sulci reflects generalized parenchymal volume loss similar to the prior study. There is a small chronic right occipital infarct. Other patchy and confluent areas of T2 hyperintensity in the supratentorial white matter nonspecific but probably reflect similar moderate chronic microvascular ischemic changes. There are chronic small vessel infarcts and/or prominent perivascular spaces of the right centrum semiovale and bilateral basal ganglia. Few small scattered foci of susceptibility hypointensity likely reflecting chronic microhemorrhages. More prominent susceptibility in the parasagittal right parietal region could also reflect a calcified meningioma, noting possible extra-axial lesion along the falx on series 14, image 9. There is no intracranial mass or mass effect. No extra-axial collection. Vascular: Major vessel flow voids at the skull base are preserved. Skull and upper cervical spine: Marrow signal is within normal limits. Other: Minimal patchy fluid opacification of the mastoids. New nonspecific abnormal signal along the high left frontoparietal scalp (series 13, image 17). MRI ORBITS FINDINGS Orbits: No proptosis. No intraorbital mass. Globes, extraocular muscles, and lacrimal glands are symmetric and unremarkable. Optic nerve sheath complexes appear unremarkable on this noncontrast study. Visualized sinuses: Mild mucosal thickening. Soft tissues: Unremarkable. IMPRESSION: Probable punctate subacute infarct of the right precentral gyrus. Small chronic right occipital infarct. Moderate chronic microvascular ischemic changes. Question of a possible small  calcified meningioma along the right falx. New nonspecific abnormal signal along the high left frontoparietal scalp (series 13, image 17). Correlate for scalp lesion on exam. Electronically Signed   By: Macy Mis M.D.   On: 06/09/2020 15:21    Procedures Procedures (including critical care time)  Medications Ordered in ED Medications  sodium chloride (PF) 0.9 % injection (has no administration in time range)  iohexol (OMNIPAQUE) 350 MG/ML injection 100 mL (60 mLs Intravenous Contrast Given 06/09/20 1533)    ED Course  I have reviewed the triage vital signs and the nursing notes.  Pertinent labs & imaging results that were available during my care of the patient were reviewed by me and considered in my medical decision making (see chart for details).    MDM Rules/Calculators/A&P                          84 yo M with a chief complaints of right sided vision loss.  This occurred spontaneously and painless.  Happened yesterday.  No other stroke symptoms.  Benign neurologic exam otherwise.  I discussed the case with Dr. Alanda Slim, ophthalmology.  Recommended ESR CRP imaging of the carotid and vertebral arteries and an MRI to evaluate for stroke.   MRI with ? [place] stroke.  Discussed with neuro, Dr. Rory Percy, he felt the patient would benefit from a TIA work-up with his risk factors and likely acute retinal artery occlusion.  Will discuss with medicine.   The patients results and plan were reviewed and discussed.   Any x-rays performed were independently reviewed by myself.   Differential diagnosis were considered with the presenting HPI.  Medications  sodium chloride (PF) 0.9 % injection (has  no administration in time range)  iohexol (OMNIPAQUE) 350 MG/ML injection 100 mL (60 mLs Intravenous Contrast Given 06/09/20 1533)    Vitals:   06/09/20 1233 06/09/20 1240 06/09/20 1400  BP: (!) 150/88  (!) 145/89  Pulse: 82  68  Resp: (!) 25  19  Temp: 97.9 F (36.6 C)    TempSrc: Oral      SpO2: 91%  93%  Weight:  (!) 140.8 kg   Height:  '5\' 11"'  (1.803 m)     Final diagnoses:  CRAO (central retinal artery occlusion), right      Final Clinical Impression(s) / ED Diagnoses Final diagnoses:  CRAO (central retinal artery occlusion), right    Rx / DC Orders ED Discharge Orders    None       Deno Etienne, DO 06/09/20 1632

## 2020-06-09 NOTE — ED Triage Notes (Signed)
[facility] pt is [facility] and rehab. Center called EMS out for stroke, EMS states it is acute vision loss in right eye that has been ongoing for 2 to 3 days.  A&O X4 Vitals  160/92 HX high b/p CBG 219 RA 95  T 97.8  HR 78 HX afib  HX glaucoma  [facility] opthalmologic dr refuses to see pt without being seen at ER.

## 2020-06-09 NOTE — ED Notes (Signed)
Pt in bed watching tv, respirations even and unlabored, no s/s of distress. Will continue to monitor.

## 2020-06-09 NOTE — ED Notes (Signed)
PT assisted to the bedside commode at this time.  Pt stands with little assistance.  Respirations even and unlabored.  NADN.

## 2020-06-09 NOTE — H&P (Addendum)
History and Physical    ASHAR LEWINSKI PRF:[id number] DOB: Apr 04, 1936 DOA: 06/09/2020  PCP: Patient, No Pcp Per   Patient coming from: NH Chief Complaints:Right vision loss after dinner 11/29 while watching football  HPI: Reginald [name] is a 84 y.o. male with medical history significant for COPD on as needed oxygen, OSA not using CPAP regularly, history of COVID-19 infection, atrial fibrillation on Eliquis, T2DM on sliding scale insulin, hyperlipidemia, history of CAD/stent history of chronic diastolic CHF, CKD IIIa sent from nursing home due to painless vision loss in the right eye, onset around after dinner while watching TV as per the patient.  Patient otherwise denies any numbness tingling focal weakness headache, chest pain shortness of breath, fever, chills.  He reports he uses walker but mostly uses wheelchair to get around.  ED Course:  BP on higher side 140-150s, admission.  ED doctor did discuss with ophthalmologist Dr. Alanda Slim recommend ESR, CRP and imaging of the carotid and vertebral arteries and MRI to evaluate for stroke. MRI with ? [place] stroke, EDP discussed with Dr. Rory Percy from neurology who felt that patient would benefit from TIA work-up with high risk factors, possibly likely acute retinal artery occlusion and [facility] was consulted for admission.  Review of Systems: All systems were reviewed and were negative except as mentioned in HPI above. Negative for fever Negative for chest pain Negative for shortness of breath  Past Medical History:  Diagnosis Date  . Anteroseptal myocardial infarction ([place])   . CHF (congestive heart failure) ([place])   . Chronic kidney disease   . COPD (chronic obstructive pulmonary disease) ([place])   . Coronary atherosclerosis of unspecified type of vessel, native or graft    a. s/p prior PCI to LAD in 1997  . Diverticulosis   . ED (erectile dysfunction)   . Foley catheter in place   . Glaucoma   . Hematuria   . History of 2019 novel coronavirus  disease (COVID-19) 11/2018  . Hydronephrosis, bilateral   . Hydroureter   . Hypercholesteremia   . Hypertensive renal disease   . Hypogonadism male   . Hypokalemia   . Obesity hypoventilation syndrome ([place])   . Obesity, unspecified   . OSA (obstructive sleep apnea)   . Peripheral vascular disease ([place])   . Permanent atrial fibrillation ([place])   . Phimosis   . Pneumonia   . Rhabdomyolysis   . Tremor   . Type II or unspecified type diabetes mellitus without mention of complication, not stated as uncontrolled   . Unspecified essential hypertension   . Urinary incontinence   . Urinary retention   . Vestibulopathy     Past Surgical History:  Procedure Laterality Date  . APPENDECTOMY  1985  . CARDIAC CATHETERIZATION    . CHOLECYSTECTOMY  06/2000  . CIRCUMCISION N/A 05/28/2020   Procedure: CIRCUMCISION ADULT;  Surgeon: Franchot Gallo, MD;  Location: [facility];  Service: Urology;  Laterality: N/A;  . CORONARY ANGIOPLASTY  08/14/1995   stent placement to LAD   . DORSAL SLIT N/A 05/28/2020   Procedure: DORSAL SLIT;  Surgeon: Franchot Gallo, MD;  Location: [facility];  Service: Urology;  Laterality: N/A;  29 MINS  . ENDOVENOUS ABLATION SAPHENOUS VEIN W/ LASER Left 02/21/2018   endovenous laser ablation L GSV by Ruta Hinds MD   . [place] Right 1985  . KNEE ARTHROSCOPY Left 1991  . LUMBAR FUSION    . NOSE SURGERY  1970s   Per Dr. Terrence [facility] in pt  chart     reports that he has never smoked. He has never used smokeless tobacco. He reports that he does not drink alcohol and does not use drugs.  Allergies  Allergen Reactions  . Adhesive [Tape] Rash    Family History  Problem Relation Age of Onset  . COPD Mother   . Heart disease Mother   . Diabetes Father   . Heart disease Father   . Diabetes Sister   . Heart disease Brother   . Heart disease Brother   . Breast cancer Maternal Aunt   . Diabetes Paternal Grandmother   . Colon cancer Maternal Aunt   .  Ovarian cancer Daughter   . Glaucoma Other      Prior to Admission medications   Medication Sig Start Date End Date Taking? Authorizing Provider  acetaminophen (TYLENOL) 325 MG tablet Take 650 mg by mouth every 4 (four) hours as needed for moderate pain.    Yes [provider]  albuterol (VENTOLIN HFA) 108 (90 Base) MCG/ACT inhaler Inhale 2 puffs into the lungs 2 (two) times daily as needed for wheezing or shortness of breath.   Yes [provider]  hydroxypropyl methylcellulose / hypromellose (ISOPTO TEARS / GONIOVISC) 2.5 % ophthalmic solution Place 1 drop into both eyes 4 (four) times daily.   Yes [provider]  atorvastatin (LIPITOR) 40 MG tablet Take 40 mg by mouth at bedtime.     [provider]  cetirizine (ZYRTEC) 10 MG tablet Take 5 mg by mouth at bedtime.     [provider]  cholecalciferol (VITAMIN D) 25 MCG (1000 UNIT) tablet Take 2,000 Units by mouth daily.    [provider]  docusate sodium (COLACE) 100 MG capsule Take 100 mg by mouth at bedtime.    [provider]  gabapentin (NEURONTIN) 100 MG capsule Take 200 mg by mouth at bedtime.     [provider]  insulin lispro (HUMALOG) 100 UNIT/ML injection Inject 0-14 Units into the skin See admin instructions. Inject 0-14 units subcutaneously twice daily per sliding scale: CBG 0-200 0 units, 201-250 2 units, 251-300 4 units, 301-350 6 units, 351-400 8 units, 401-450 10 units, 451-500 12 units, >500 14 units    [provider]  latanoprost (XALATAN) 0.005 % ophthalmic solution Place 1 drop into both eyes at bedtime. 01/03/17   [provider]  magnesium oxide (MAG-OX) 400 MG tablet Take 400 mg by mouth daily.    [provider]  Melatonin 5 MG TABS Take 5 mg by mouth at bedtime.    [provider]  Menthol, Topical Analgesic, (BIOFREEZE) 4 % GEL Apply 1 application topically 2 (two) times daily as needed (forehead and left arm  pain).    [provider]  metFORMIN (GLUCOPHAGE-XR) 750 MG 24 hr tablet Take 750 mg by mouth daily.     [provider]  metoprolol succinate (TOPROL-XL) 25 MG 24 hr tablet Take 25 mg by mouth daily.    [provider]  nitroGLYCERIN (NITROSTAT) 0.4 MG SL tablet Place 0.4 mg under the tongue every 5 (five) minutes as needed for chest pain.    [provider]  oxybutynin (DITROPAN) 5 MG tablet Take 5 mg by mouth every 8 (eight) hours as needed for bladder spasms.  05/07/20   [provider]  polyethylene glycol (MIRALAX / GLYCOLAX) 17 g packet Take 17 g by mouth daily. Mix in 6 oz fluid and drink    [provider]  Skin Protectants, Misc. (EUCERIN) cream Apply 1 application topically 2 (two) times a day. For itching    [provider]  spironolactone (ALDACTONE) 25 MG tablet Take 12.5 mg by mouth daily.    [provider]  tamsulosin (FLOMAX) 0.4 MG CAPS capsule Take 1 capsule (0.4 mg total) by mouth daily after supper. 12/25/18   Kayleen Memos, DO  vitamin B-12 (CYANOCOBALAMIN) 1000 MCG tablet Take 1,000 mcg by mouth daily.    [provider]    Physical Exam: Vitals:   06/09/20 1233 06/09/20 1240 06/09/20 1400  BP: (!) 150/88  (!) 145/89  Pulse: 82  68  Resp: (!) 25  19  Temp: 97.9 F (36.6 C)    TempSrc: Oral    SpO2: 91%  93%  Weight:  (!) 140.8 kg   Height:  _0  (1.803 m)     General exam: AAOx3 , NAD, weak appearing. HEENT:Oral mucosa moist, Ear/Nose WNL grossly, dentition normal. Respiratory system: bilaterally clear,no wheezing or crackles,no use of accessory muscle Cardiovascular system: S1 & S2 +, No JVD,. Gastrointestinal system: Abdomen soft, NT,ND, BS+ Nervous System:Alert, awake, moving all extremities and grossly nonfocal. No vision in rt eye except abel to see some light at bright light Extremities: No edema, distal peripheral pulses palpable.  Skin: No rashes,no icterus. MSK: Normal  muscle bulk,tone, power    Labs on Admission: I have personally reviewed following labs and imaging studies  CBC: Recent Labs  Lab 06/09/20 1315  WBC 8.3  NEUTROABS 5.1  HGB 16.6  HCT 52.2*  MCV 93.4  PLT 259   Basic Metabolic Panel: Recent Labs  Lab 06/09/20 1315  NA 138  K 4.4  CL 101  CO2 29  GLUCOSE 177*  BUN 23  CREATININE 1.58*  CALCIUM 9.6   GFR: Estimated Creatinine Clearance: 51.7 mL/min (A) (by C-G formula based on SCr of 1.58 mg/dL (H)). Liver Function Tests: Recent Labs  Lab 06/09/20 1315  AST 28  ALT 32  ALKPHOS 60  BILITOT 1.0  PROT 6.6  ALBUMIN 3.8   No results for input(s): LIPASE, AMYLASE in the last 168 hours. No results for input(s): AMMONIA in the last 168 hours. Coagulation Profile: No results for input(s): INR, PROTIME in the last 168 hours. Cardiac Enzymes: No results for input(s): CKTOTAL, CKMB, CKMBINDEX, TROPONINI in the last 168 hours. BNP (last 3 results) No results for input(s): PROBNP in the last 8760 hours. HbA1C: No results for input(s): HGBA1C in the last 72 hours. CBG: No results for input(s): GLUCAP in the last 168 hours. Lipid Profile: No results for input(s): CHOL, HDL, LDLCALC, TRIG, CHOLHDL, LDLDIRECT in the last 72 hours. Thyroid Function Tests: No results for input(s): TSH, T4TOTAL, FREET4, T3FREE, THYROIDAB in the last 72 hours. Anemia Panel: No results for input(s): VITAMINB12, FOLATE, FERRITIN, TIBC, IRON, RETICCTPCT in the last 72 hours. Urine analysis:    Component Value Date/Time   COLORURINE YELLOW 05/03/2020 1803   APPEARANCEUR CLEAR 05/03/2020 1803   LABSPEC 1.009 05/03/2020 1803   PHURINE 6.0 05/03/2020 1803   GLUCOSEU NEGATIVE 05/03/2020 1803   HGBUR LARGE (A) 05/03/2020 1803   BILIRUBINUR NEGATIVE 05/03/2020 1803   KETONESUR NEGATIVE 05/03/2020 1803   PROTEINUR NEGATIVE 05/03/2020 1803   UROBILINOGEN 1.0 05/13/2015 1549   NITRITE POSITIVE (A) 05/03/2020 1803   LEUKOCYTESUR MODERATE (A)  05/03/2020 1803    Radiological Exams on Admission: CT Angio Head W or Wo Contrast  Result Date: 06/09/2020 CLINICAL DATA:  Vision loss,  monocular. Additional history provided: Additional history provided: Sudden onset right eye vision loss (symptoms for 2-3 days). EXAM: CT ANGIOGRAPHY HEAD AND NECK TECHNIQUE: Multidetector CT imaging of the head and neck was performed using the standard protocol during bolus administration of intravenous contrast. Multiplanar CT image reconstructions and MIPs were obtained to evaluate the vascular anatomy. Carotid stenosis measurements (when applicable) are obtained utilizing NASCET criteria, using the distal internal carotid diameter as the denominator. CONTRAST:  77mL OMNIPAQUE IOHEXOL 350 MG/ML SOLN COMPARISON:  MRI of the brain and orbits performed earlier the same day 06/09/2020. CT angiogram head/neck 01/04/2017. FINDINGS: CT HEAD FINDINGS Brain: Moderate generalized cerebral atrophy. 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 Moderately advanced ill-defined hypoattenuation within the cerebral white matter is nonspecific, but compatible with chronic small vessel ischemic disease. 10 mm lobular focus of dural-based calcification projecting rightward from the posterior falx, nonspecific but possibly reflecting a small calcified meningioma (series 4, image 23). There is no acute intracranial hemorrhage. No CT evidence of acute cortical infarct. A probable punctate subacute infarct within the right precentral gyrus was better appreciated on the same-day brain MRI. No extra-axial fluid collection. No evidence of intracranial mass. No midline shift. Vascular: No hyperdense vessel.  Atherosclerotic calcifications. Skull: Normal. Negative for fracture or focal lesion. Sinuses: Trace ethmoid, right sphenoid and right maxillary sinus mucosal thickening. Orbits: No mass or acute finding. Other: 2.1 cm left parietal scalp lesion. Review of the MIP images confirms  the above findings CTA NECK FINDINGS Aortic arch: Standard aortic branching. Atherosclerotic plaque within the visualized aortic arch and proximal major branch vessels of the neck. No hemodynamically significant innominate or proximal subclavian artery stenosis. Right carotid system: CCA and ICA patent within the neck. Prominent soft and calcified plaque within the carotid bifurcation and proximal ICA. Resultant stenosis of the proximal ICA of up to 70% (series 12, images 148 and 149). Distal to this, the ICA is patent within the neck without stenosis. Partially retropharyngeal course of the ICA Left carotid system: CCA and ICA patent within the neck without significant stenosis (50% or greater). Mild calcified plaque within the carotid bifurcation and proximal ICA. Vertebral arteries: Codominant and patent within the neck bilaterally. Streak and beam hardening artifact arising from the patient's shoulders limits evaluation. However, there is apparent moderate/severe stenosis at the origin of both vertebral arteries. Skeleton: No acute bony abnormality or aggressive osseous lesion. 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 As before, prominent ossification of the posterior longitudinal ligament contributes to suspected at least moderate spinal canal stenosis at C2-C3, C3-C4 and C4-C5. Other neck: No neck mass or cervical lymphadenopathy. Upper chest: No consolidation within the imaged lung apices. The visualized intrathoracic esophagus is somewhat patulous. Review of the MIP images confirms the above findings CTA HEAD FINDINGS Anterior circulation: The intracranial internal carotid arteries are patent. Calcified plaque within both vessels. No more than mild stenosis on the right. Moderate stenosis of the left paraclinoid segment. The M1 middle cerebral arteries are patent. Moderate stenosis of the proximal M1 left MCA (series 15, image 50). Mild-to-moderate stenosis of  the distal M1 left MCA. No M2 proximal branch occlusion or high-grade proximal stenosis. Atherosclerotic irregularity of the M2 and more distal MCA branches bilaterally. Most notably, there is a high-grade stenosis within a superior division proximal M2 left MCA branch vessel (series 17, image 29). The anterior cerebral arteries are patent. Atherosclerotic irregularity of the bilateral anterior cerebral arteries. Most notably, there is mild-to-moderate atherosclerotic irregularity  of both A2 segments and a high-grade focal stenosis within the A4 right ACA (series 7, image 21). No intracranial aneurysm is identified. Posterior circulation: The intracranial vertebral arteries are patent. The basilar artery is patent. The posterior cerebral arteries are patent. Atherosclerotic irregularity of both posterior cerebral arteries. Most notably, there is a high-grade stenosis within the P2 right PCA (series 15, image 50) and a moderate/severe stenosis within a left PCA branch at the P2/P3 junction (series 17, image 25). A small left posterior communicating artery is present. The right posterior communicating artery is hypoplastic or absent. Venous sinuses: Within the limitations of contrast timing, no convincing thrombus. Anatomic variants: As described Review of the MIP images confirms the above findings IMPRESSION: CT head: 1. No CT evidence of acute intracranial abnormality. A probable punctate subacute infarct within the right precentral gyrus was better appreciated on the same-day brain MRI. 2. 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 3. Moderate cerebral atrophy and moderately advanced chronic small vessel ischemic disease. 4. 10 mm rounded focus of dural-based calcification projecting rightward from the posterior falx, nonspecific but possibly reflecting a small incidental meningioma. 5. 2.1 cm left parietal scalp lesion. Direct visualization recommended CTA neck: 1. The common and  internal carotid arteries are patent within the neck. Prominent soft and calcified plaque within the right carotid bifurcation and proximal ICA with resultant stenosis of the proximal right ICA of up to 70% (progressed as compared to the prior CTA of 01/04/2017). 2. Vertebral arteries patent within the neck. Apparent moderate/severe stenoses at both vertebral artery origins. CTA head: 1. No intracranial large vessel occlusion. 2. Intracranial atherosclerotic disease with multifocal stenoses, most notably as follows (and not significantly changed as compared to the CTA of 02/03/2017). 3. Moderate stenosis of the paraclinoid left ICA. 4. Moderate stenosis of the proximal M1 left MCA. 5. High-grade focal stenosis within the A4 right anterior cerebral artery. 6. High-grade stenosis within the P2 right PCA. 7. Moderate/severe focal stenosis within the left PCA at the P2/P3 junction. Electronically Signed   By: Kellie Simmering DO   On: 06/09/2020 16:38   CT Angio Neck W and/or Wo Contrast  Result Date: 06/09/2020 CLINICAL DATA:  Vision loss, monocular. Additional history provided: Additional history provided: Sudden onset right eye vision loss (symptoms for 2-3 days). EXAM: CT ANGIOGRAPHY HEAD AND NECK TECHNIQUE: Multidetector CT imaging of the head and neck was performed using the standard protocol during bolus administration of intravenous contrast. Multiplanar CT image reconstructions and MIPs were obtained to evaluate the vascular anatomy. Carotid stenosis measurements (when applicable) are obtained utilizing NASCET criteria, using the distal internal carotid diameter as the denominator. CONTRAST:  69m OMNIPAQUE IOHEXOL 350 MG/ML SOLN COMPARISON:  MRI of the brain and orbits performed earlier the same day 06/09/2020. CT angiogram head/neck 01/04/2017. FINDINGS: CT HEAD FINDINGS Brain: Moderate generalized cerebral atrophy. 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 Moderately advanced ill-defined  hypoattenuation within the cerebral white matter is nonspecific, but compatible with chronic small vessel ischemic disease. 10 mm lobular focus of dural-based calcification projecting rightward from the posterior falx, nonspecific but possibly reflecting a small calcified meningioma (series 4, image 23). There is no acute intracranial hemorrhage. No CT evidence of acute cortical infarct. A probable punctate subacute infarct within the right precentral gyrus was better appreciated on the same-day brain MRI. No extra-axial fluid collection. No evidence of intracranial mass. No midline shift. Vascular: No hyperdense vessel.  Atherosclerotic calcifications. Skull: Normal. Negative for fracture or  focal lesion. Sinuses: Trace ethmoid, right sphenoid and right maxillary sinus mucosal thickening. Orbits: No mass or acute finding. Other: 2.1 cm left parietal scalp lesion. Review of the MIP images confirms the above findings CTA NECK FINDINGS Aortic arch: Standard aortic branching. Atherosclerotic plaque within the visualized aortic arch and proximal major branch vessels of the neck. No hemodynamically significant innominate or proximal subclavian artery stenosis. Right carotid system: CCA and ICA patent within the neck. Prominent soft and calcified plaque within the carotid bifurcation and proximal ICA. Resultant stenosis of the proximal ICA of up to 70% (series 12, images 148 and 149). Distal to this, the ICA is patent within the neck without stenosis. Partially retropharyngeal course of the ICA Left carotid system: CCA and ICA patent within the neck without significant stenosis (50% or greater). Mild calcified plaque within the carotid bifurcation and proximal ICA. Vertebral arteries: Codominant and patent within the neck bilaterally. Streak and beam hardening artifact arising from the patient's shoulders limits evaluation. However, there is apparent moderate/severe stenosis at the origin of both vertebral arteries.  Skeleton: No acute bony abnormality or aggressive osseous lesion. 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 As before, prominent ossification of the posterior longitudinal ligament contributes to suspected at least moderate spinal canal stenosis at C2-C3, C3-C4 and C4-C5. Other neck: No neck mass or cervical lymphadenopathy. Upper chest: No consolidation within the imaged lung apices. The visualized intrathoracic esophagus is somewhat patulous. Review of the MIP images confirms the above findings CTA HEAD FINDINGS Anterior circulation: The intracranial internal carotid arteries are patent. Calcified plaque within both vessels. No more than mild stenosis on the right. Moderate stenosis of the left paraclinoid segment. The M1 middle cerebral arteries are patent. Moderate stenosis of the proximal M1 left MCA (series 15, image 50). Mild-to-moderate stenosis of the distal M1 left MCA. No M2 proximal branch occlusion or high-grade proximal stenosis. Atherosclerotic irregularity of the M2 and more distal MCA branches bilaterally. Most notably, there is a high-grade stenosis within a superior division proximal M2 left MCA branch vessel (series 17, image 29). The anterior cerebral arteries are patent. Atherosclerotic irregularity of the bilateral anterior cerebral arteries. Most notably, there is mild-to-moderate atherosclerotic irregularity of both A2 segments and a high-grade focal stenosis within the A4 right ACA (series 7, image 21). No intracranial aneurysm is identified. Posterior circulation: The intracranial vertebral arteries are patent. The basilar artery is patent. The posterior cerebral arteries are patent. Atherosclerotic irregularity of both posterior cerebral arteries. Most notably, there is a high-grade stenosis within the P2 right PCA (series 15, image 50) and a moderate/severe stenosis within a left PCA branch at the P2/P3 junction (series 17, image 25). A  small left posterior communicating artery is present. The right posterior communicating artery is hypoplastic or absent. Venous sinuses: Within the limitations of contrast timing, no convincing thrombus. Anatomic variants: As described Review of the MIP images confirms the above findings IMPRESSION: CT head: 1. No CT evidence of acute intracranial abnormality. A probable punctate subacute infarct within the right precentral gyrus was better appreciated on the same-day brain MRI. 2. 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 3. Moderate cerebral atrophy and moderately advanced chronic small vessel ischemic disease. 4. 10 mm rounded focus of dural-based calcification projecting rightward from the posterior falx, nonspecific but possibly reflecting a small incidental meningioma. 5. 2.1 cm left parietal scalp lesion. Direct visualization recommended CTA neck: 1. The common and internal carotid arteries are patent within the  neck. Prominent soft and calcified plaque within the right carotid bifurcation and proximal ICA with resultant stenosis of the proximal right ICA of up to 70% (progressed as compared to the prior CTA of 01/04/2017). 2. Vertebral arteries patent within the neck. Apparent moderate/severe stenoses at both vertebral artery origins. CTA head: 1. No intracranial large vessel occlusion. 2. Intracranial atherosclerotic disease with multifocal stenoses, most notably as follows (and not significantly changed as compared to the CTA of 02/03/2017). 3. Moderate stenosis of the paraclinoid left ICA. 4. Moderate stenosis of the proximal M1 left MCA. 5. High-grade focal stenosis within the A4 right anterior cerebral artery. 6. High-grade stenosis within the P2 right PCA. 7. Moderate/severe focal stenosis within the left PCA at the P2/P3 junction. Electronically Signed   By: Kellie Simmering DO   On: 06/09/2020 16:38   MR BRAIN WO CONTRAST  Result Date: 06/09/2020 CLINICAL DATA:   Right eye vision loss EXAM: MRI HEAD AND ORBITS WITHOUT CONTRAST TECHNIQUE: Multiplanar, multiecho pulse sequences of the brain and surrounding structures were obtained without intravenous contrast. Multiplanar, multiecho pulse sequences of the orbits and surrounding structures were obtained including fat saturation techniques, without intravenous contrast administration. COMPARISON:  2019 FINDINGS: MRI HEAD FINDINGS Brain: A punctate focus of diffusion hyperintensity with ADC isointensity is present along the right precentral gyrus. Prominence of the ventricles and sulci reflects generalized parenchymal volume loss similar to the prior study. There is a small chronic right occipital infarct. Other patchy and confluent areas of T2 hyperintensity in the supratentorial white matter nonspecific but probably reflect similar moderate chronic microvascular ischemic changes. There are chronic small vessel infarcts and/or prominent perivascular spaces of the right centrum semiovale and bilateral basal ganglia. Few small scattered foci of susceptibility hypointensity likely reflecting chronic microhemorrhages. More prominent susceptibility in the parasagittal right parietal region could also reflect a calcified meningioma, noting possible extra-axial lesion along the falx on series 14, image 9. There is no intracranial mass or mass effect. No extra-axial collection. Vascular: Major vessel flow voids at the skull base are preserved. Skull and upper cervical spine: Marrow signal is within normal limits. Other: Minimal patchy fluid opacification of the mastoids. New nonspecific abnormal signal along the high left frontoparietal scalp (series 13, image 17). MRI ORBITS FINDINGS Orbits: No proptosis. No intraorbital mass. Globes, extraocular muscles, and lacrimal glands are symmetric and unremarkable. Optic nerve sheath complexes appear unremarkable on this noncontrast study. Visualized sinuses: Mild mucosal thickening. Soft  tissues: Unremarkable. IMPRESSION: Probable punctate subacute infarct of the right precentral gyrus. Small chronic right occipital infarct. Moderate chronic microvascular ischemic changes. Question of a possible small calcified meningioma along the right falx. New nonspecific abnormal signal along the high left frontoparietal scalp (series 13, image 17). Correlate for scalp lesion on exam. Electronically Signed   By: Macy Mis M.D.   On: 06/09/2020 15:21   MR ORBITS WO CONTRAST  Result Date: 06/09/2020 CLINICAL DATA:  Right eye vision loss EXAM: MRI HEAD AND ORBITS WITHOUT CONTRAST TECHNIQUE: Multiplanar, multiecho pulse sequences of the brain and surrounding structures were obtained without intravenous contrast. Multiplanar, multiecho pulse sequences of the orbits and surrounding structures were obtained including fat saturation techniques, without intravenous contrast administration. COMPARISON:  2019 FINDINGS: MRI HEAD FINDINGS Brain: A punctate focus of diffusion hyperintensity with ADC isointensity is present along the right precentral gyrus. Prominence of the ventricles and sulci reflects generalized parenchymal volume loss similar to the prior study. There is a small chronic right occipital infarct. Other patchy and  confluent areas of T2 hyperintensity in the supratentorial white matter nonspecific but probably reflect similar moderate chronic microvascular ischemic changes. There are chronic small vessel infarcts and/or prominent perivascular spaces of the right centrum semiovale and bilateral basal ganglia. Few small scattered foci of susceptibility hypointensity likely reflecting chronic microhemorrhages. More prominent susceptibility in the parasagittal right parietal region could also reflect a calcified meningioma, noting possible extra-axial lesion along the falx on series 14, image 9. There is no intracranial mass or mass effect. No extra-axial collection. Vascular: Major vessel flow voids  at the skull base are preserved. Skull and upper cervical spine: Marrow signal is within normal limits. Other: Minimal patchy fluid opacification of the mastoids. New nonspecific abnormal signal along the high left frontoparietal scalp (series 13, image 17). MRI ORBITS FINDINGS Orbits: No proptosis. No intraorbital mass. Globes, extraocular muscles, and lacrimal glands are symmetric and unremarkable. Optic nerve sheath complexes appear unremarkable on this noncontrast study. Visualized sinuses: Mild mucosal thickening. Soft tissues: Unremarkable. IMPRESSION: Probable punctate subacute infarct of the right precentral gyrus. Small chronic right occipital infarct. Moderate chronic microvascular ischemic changes. Question of a possible small calcified meningioma along the right falx. New nonspecific abnormal signal along the high left frontoparietal scalp (series 13, image 17). Correlate for scalp lesion on exam. Electronically Signed   By: Macy Mis M.D.   On: 06/09/2020 15:21     Assessment/Plan  Vision loss of right eye: Ophthalmology was discussed by EDP-advised imaging, ESR- ( pending) CRP at 1.0. MRI was done- it shows  "Probable punctate subacute infarct of the right precentral gyrus". Neuro consulted. Will admit for stroke work-up, ? acute retinal artery occlusion.  We will keep the patient on neurochecks, order echocardiogram, carotid duplex, lipid panel hemoglobin A1c.Resume Eliquis, statin.  COPD on as needed oxygen: Respiratory status is stable. OSA not using CPAP regularly history of COVID-19 infection  Chronic Atrial fibrillation on Eliquis and Lopressor.  Will resume his home meds.  T2DM on sliding scale insulin at nursing home, continue the same, check hemoglobin A1c.  Hold oral Metformin.  Hyperlipidemia: Resume statin.  Check fasting lipid profile. history of CAD/stent history of chronic diastolic CHF/Hypertension: Euvolemic, no chest pain.  Resume Eliquis, Toprol for A. fib, hold  Aldactone..  CKD IIIa: Renal function remains stable at baseline.  Chronic venous insufficiency with dermatitis of lower extremities  Morbid obesity with BMI 43, will benefit with weight loss.  Body mass index is 43.31 kg/m.   Severity of Illness:  * I certify that at the point of admission it is my clinical judgment that the patient will require lesS  than 2 midnight stay and deemed observation level of care.    DVT prophylaxis: Eliquis  Code Status:   Code Status: Full Code FULL Family Communication: Admission, patients condition and plan of care including tests being ordered have been discussed with the patient who indicate understanding and agree with the plan and Code Status.  Consults called:   Antonieta Pert MD [facility]  If 7PM-7AM, please contact night-coverage [email or web address]  06/09/2020, 5:33 PM

## 2020-06-10 ENCOUNTER — Observation Stay (HOSPITAL_COMMUNITY): Payer: Medicare Other

## 2020-06-10 DIAGNOSIS — L309 Dermatitis, unspecified: Secondary | ICD-10-CM | POA: Diagnosis present

## 2020-06-10 DIAGNOSIS — Z20822 Contact with and (suspected) exposure to covid-19: Secondary | ICD-10-CM | POA: Diagnosis present

## 2020-06-10 DIAGNOSIS — G459 Transient cerebral ischemic attack, unspecified: Secondary | ICD-10-CM | POA: Diagnosis not present

## 2020-06-10 DIAGNOSIS — H5461 Unqualified visual loss, right eye, normal vision left eye: Secondary | ICD-10-CM | POA: Diagnosis present

## 2020-06-10 DIAGNOSIS — I63231 Cerebral infarction due to unspecified occlusion or stenosis of right carotid arteries: Secondary | ICD-10-CM | POA: Diagnosis present

## 2020-06-10 DIAGNOSIS — I25118 Atherosclerotic heart disease of native coronary artery with other forms of angina pectoris: Secondary | ICD-10-CM | POA: Diagnosis not present

## 2020-06-10 DIAGNOSIS — I4821 Permanent atrial fibrillation: Secondary | ICD-10-CM | POA: Diagnosis present

## 2020-06-10 DIAGNOSIS — I6521 Occlusion and stenosis of right carotid artery: Secondary | ICD-10-CM | POA: Diagnosis not present

## 2020-06-10 DIAGNOSIS — I739 Peripheral vascular disease, unspecified: Secondary | ICD-10-CM | POA: Diagnosis not present

## 2020-06-10 DIAGNOSIS — E785 Hyperlipidemia, unspecified: Secondary | ICD-10-CM | POA: Diagnosis present

## 2020-06-10 DIAGNOSIS — K5790 Diverticulosis of intestine, part unspecified, without perforation or abscess without bleeding: Secondary | ICD-10-CM | POA: Diagnosis present

## 2020-06-10 DIAGNOSIS — N184 Chronic kidney disease, stage 4 (severe): Secondary | ICD-10-CM | POA: Diagnosis present

## 2020-06-10 DIAGNOSIS — Z981 Arthrodesis status: Secondary | ICD-10-CM | POA: Diagnosis not present

## 2020-06-10 DIAGNOSIS — H3411 Central retinal artery occlusion, right eye: Secondary | ICD-10-CM | POA: Diagnosis present

## 2020-06-10 DIAGNOSIS — K5900 Constipation, unspecified: Secondary | ICD-10-CM | POA: Diagnosis present

## 2020-06-10 DIAGNOSIS — R29702 NIHSS score 2: Secondary | ICD-10-CM | POA: Diagnosis present

## 2020-06-10 DIAGNOSIS — E1151 Type 2 diabetes mellitus with diabetic peripheral angiopathy without gangrene: Secondary | ICD-10-CM | POA: Diagnosis present

## 2020-06-10 DIAGNOSIS — I5032 Chronic diastolic (congestive) heart failure: Secondary | ICD-10-CM | POA: Diagnosis present

## 2020-06-10 DIAGNOSIS — I252 Old myocardial infarction: Secondary | ICD-10-CM | POA: Diagnosis not present

## 2020-06-10 DIAGNOSIS — I130 Hypertensive heart and chronic kidney disease with heart failure and stage 1 through stage 4 chronic kidney disease, or unspecified chronic kidney disease: Secondary | ICD-10-CM | POA: Diagnosis present

## 2020-06-10 DIAGNOSIS — G464 Cerebellar stroke syndrome: Secondary | ICD-10-CM | POA: Diagnosis not present

## 2020-06-10 DIAGNOSIS — G4733 Obstructive sleep apnea (adult) (pediatric): Secondary | ICD-10-CM | POA: Diagnosis present

## 2020-06-10 DIAGNOSIS — J449 Chronic obstructive pulmonary disease, unspecified: Secondary | ICD-10-CM | POA: Diagnosis present

## 2020-06-10 DIAGNOSIS — E1122 Type 2 diabetes mellitus with diabetic chronic kidney disease: Secondary | ICD-10-CM | POA: Diagnosis present

## 2020-06-10 DIAGNOSIS — I872 Venous insufficiency (chronic) (peripheral): Secondary | ICD-10-CM | POA: Diagnosis present

## 2020-06-10 DIAGNOSIS — Z993 Dependence on wheelchair: Secondary | ICD-10-CM | POA: Diagnosis not present

## 2020-06-10 DIAGNOSIS — I2510 Atherosclerotic heart disease of native coronary artery without angina pectoris: Secondary | ICD-10-CM | POA: Diagnosis present

## 2020-06-10 DIAGNOSIS — H409 Unspecified glaucoma: Secondary | ICD-10-CM | POA: Diagnosis present

## 2020-06-10 DIAGNOSIS — Z6841 Body Mass Index (BMI) 40.0 and over, adult: Secondary | ICD-10-CM | POA: Diagnosis not present

## 2020-06-10 LAB — GLUCOSE, CAPILLARY
Glucose-Capillary: 180 mg/dL — ABNORMAL HIGH (ref 70–99)
Glucose-Capillary: 191 mg/dL — ABNORMAL HIGH (ref 70–99)
Glucose-Capillary: 193 mg/dL — ABNORMAL HIGH (ref 70–99)

## 2020-06-10 LAB — ECHOCARDIOGRAM COMPLETE
Area-P 1/2: 3.65 cm2
Height: 71 in
S' Lateral: 3.9 cm
Weight: 4967.93 oz

## 2020-06-10 LAB — CBG MONITORING, ED
Glucose-Capillary: 159 mg/dL — ABNORMAL HIGH (ref 70–99)
Glucose-Capillary: 173 mg/dL — ABNORMAL HIGH (ref 70–99)

## 2020-06-10 MED ORDER — PERFLUTREN LIPID MICROSPHERE
1.0000 mL | INTRAVENOUS | Status: AC | PRN
  Administered 2020-06-10: 2 mL via INTRAVENOUS
  Filled 2020-06-10: qty 10

## 2020-06-10 NOTE — Evaluation (Signed)
Occupational Therapy Evaluation Patient Details Name: Reginald [name] MRN: [id number] DOB: 10-25-1935 Today's Date: 06/10/2020    History of Present Illness Patient is an 84 year old LTC resident at [facility] admited 11/30 d/t painless vision loss in the right eye. MRI shows questionable recent stroke with neurology consulted, patient with possible acute retinal artery occlusion. PMH includes COPD, OSA, history of COVID-19 infection, a-fib on Eliquis, T2DM, hyperlipidemia, history of CAD/stent, history of chronic diastolic CHF, CKD IIIa   Clinical Impression   Patient resides at [facility] SNF, reports assist with ADLs, limited ambulation with RW with supervision but primarily utilizes w/c for functional mobility. Patient states he can self propel w/c "but I like help." Currently patient exhibits R visual deficits, when L eye occluded patient unable to OT's fingers, read signs in room ect. Patient also exhibiting some R lateral overshooting. Patient required mod A x2 for bed mobility and min A x1-2 for safety to side step to [facility] with rolling walker. Patient reporting fatigue and request to sit down after less than 1 min of standing, states his LEs are feeling weaker from "laying around." Recommend continued acute OT services for further functional assessment of visual and compensatory strategies to maximize patient safety with functional mobility/transfers/ADLs in order to facilitate D/C to venue listed below.    Follow Up Recommendations  SNF    Equipment Recommendations  None recommended by OT       Precautions / Restrictions Precautions Precautions: Fall Restrictions Weight Bearing Restrictions: No      Mobility Bed Mobility Overal bed mobility: Needs Assistance Bed Mobility: Supine to Sit;Sit to Supine     Supine to sit: Mod assist;+2 for physical assistance;+2 for safety/equipment;HOB elevated Sit to supine: Mod assist;+2 for physical assistance;+2 for safety/equipment   General  bed mobility comments: mod A x2 for trunk and LE management, patient reports legs feeling more weak since not moving around much    Transfers Overall transfer level: Needs assistance Equipment used: Rolling walker (2 wheeled) Transfers: Sit to/from Stand Sit to Stand: Min assist;+2 safety/equipment         General transfer comment: please see toilet transfer in ADL section    Balance Overall balance assessment: Needs assistance Sitting-balance support: Feet supported Sitting balance-Leahy Scale: Fair     Standing balance support: Bilateral upper extremity supported Standing balance-Leahy Scale: Poor Standing balance comment: reliant on external support                           ADL either performed or assessed with clinical judgement   ADL Overall ADL's : Needs assistance/impaired Eating/Feeding: Set up;Sitting;Bed level   Grooming: Set up;Sitting;Bed level   Upper Body Bathing: Minimal assistance;Sitting   Lower Body Bathing: Maximal assistance;Sitting/lateral leans   Upper Body Dressing : Minimal assistance;Sitting   Lower Body Dressing: Maximal assistance;Sit to/from stand;Sitting/lateral leans   Toilet Transfer: Minimal assistance;RW Toilet Transfer Details (indicate cue type and reason): side step along HOB, min A for safety, decreased standing tolerance with patient requesting to sit after standing ~1 min Toileting- Clothing Manipulation and Hygiene: Total assistance;Sitting/lateral lean;Sit to/from stand       Functional mobility during ADLs: Minimal assistance;+2 for safety/equipment;Rolling walker;Cueing for safety General ADL Comments: patient is LTC resident at [facility] and has assistance at baseline for dressing, bathing, toileting.      Vision Baseline Vision/History: Wears glasses Wears Glasses: At all times Patient Visual Report: Other (comment);Central vision impairment;Peripheral vision impairment (  poss acute retinal artery occlusion  ) Vision Assessment?: Yes Eye Alignment: Within Functional Limits Ocular Range of Motion: Within Functional Limits Alignment/Gaze Preference: Within Defined Limits Tracking/Visual Pursuits: Able to track stimulus in all quads without difficulty Convergence: Within functional limits Visual Fields: Right visual field deficit Depth Perception:  (overshoots to R ) Additional Comments: able to read sign off wall ~96ft away. With L eye closed unable to see fingers held in front of R eye            Pertinent Vitals/Pain Pain Assessment: No/denies pain     Hand Dominance Right   Extremity/Trunk Assessment Upper Extremity Assessment Upper Extremity Assessment: Overall WFL for tasks assessed   Lower Extremity Assessment Lower Extremity Assessment: Defer to PT evaluation   Cervical / Trunk Assessment Cervical / Trunk Assessment: Normal   Communication Communication Communication: No difficulties   Cognition Arousal/Alertness: Awake/alert Behavior During Therapy: WFL for tasks assessed/performed Overall Cognitive Status: Within Functional Limits for tasks assessed                                                Home Living Family/patient expects to be discharged to:: Skilled nursing facility                                        Prior Functioning/Environment Level of Independence: Needs assistance  Gait / Transfers Assistance Needed: patient reports ambulating with walker ~14ft increments with supervision, primarily utilizes w/c for mobility and can propel with UE/LEs ADL's / Homemaking Assistance Needed: patient has assist for ADLs dressing, bathing, toileting            OT Problem List: Impaired vision/perception;Decreased activity tolerance      OT Treatment/Interventions: Visual/perceptual remediation/compensation;Self-care/ADL training;Patient/family education    OT Goals(Current goals can be found in the care plan section) Acute  Rehab OT Goals Patient Stated Goal: "my legs are getting weaker" OT Goal Formulation: With patient Time For Goal Achievement: 06/24/20 Potential to Achieve Goals: Good  OT Frequency: Min 2X/week           Co-evaluation PT/OT/SLP Co-Evaluation/Treatment: Yes Reason for Co-Treatment: For patient/therapist safety;To address functional/ADL transfers   OT goals addressed during session: ADL's and self-care      AM-PAC OT "6 Clicks" Daily Activity     Outcome Measure Help from another person eating meals?: A Little Help from another person taking care of personal grooming?: A Little Help from another person toileting, which includes using toliet, bedpan, or urinal?: A Lot Help from another person bathing (including washing, rinsing, drying)?: A Lot Help from another person to put on and taking off regular upper body clothing?: A Little Help from another person to put on and taking off regular lower body clothing?: A Lot 6 Click Score: 15   End of Session Equipment Utilized During Treatment: Rolling walker Nurse Communication: Mobility status  Activity Tolerance: Patient tolerated treatment well Patient left: in bed;with call bell/phone within reach  OT Visit Diagnosis: Other symptoms and signs involving the nervous system (R29.898)                Time: 9562-1308 OT Time Calculation (min): 37 min Charges:  OT General Charges $OT Visit: 1 Visit OT Evaluation $OT Eval Moderate Complexity: 1 Mod  Delbert Phenix OT OT pager: [phone]  Rosemary Holms 06/10/2020, 10:36 AM

## 2020-06-10 NOTE — NC FL2 (Addendum)
[place] LEVEL OF CARE SCREENING TOOL     IDENTIFICATION  Patient Name: Reginald [name] Birthdate: August 08, 1935 Sex: male Admission Date (Current Location): 06/09/2020  [facility] and [place] Number:  Herbalist and Address:  [facility], [place] [address], [place], [place] [address]      Provider Number: [id number]  Attending Physician Name and Address:  Hosie Poisson, MD  Relative Name and Phone Number:  Elease Etienne) [phone]    Current Level of Care: Hospital Recommended Level of Care: [place] Prior Approval Number:    Date Approved/Denied:   PASRR Number: [id number]  Discharge Plan: SNF    Current Diagnoses: Patient Active Problem List   Diagnosis Date Noted  . Vision loss of right eye 06/09/2020  . Acute on chronic renal failure ([place]) 12/19/2018  . Acute renal failure superimposed on stage 4 chronic kidney disease ([place]) 12/19/2018  . AKI (acute kidney injury) ([place]) 11/26/2018  . HCAP (healthcare-associated pneumonia) 11/15/2018  . Acute respiratory disease due to COVID-19 virus 11/15/2018  . Chronic venous insufficiency 10/04/2017  . Venous stasis ulcers of both lower extremities ([place]) 10/04/2017  . Nonspecific chest pain 10/27/2016  . Morbid obesity ([place]) 05/02/2016  . Lymphedema 05/02/2016  . Abnormal laboratory test result 10/15/2015  . Cough 10/08/2015  . Hematuria 09/21/2015  . Hypokalemia 08/13/2015  . HTN (hypertension) 08/13/2015  . Multiple open wounds of lower leg 07/22/2015  . CKD (chronic kidney disease) stage 4, GFR 15-29 ml/min (HCC) 07/22/2015  . Cellulitis of leg, right 06/28/2015  . ARF (acute renal failure) ([place]) 06/28/2015  . Hypertensive heart disease with CHF (congestive heart failure) ([place]) 05/19/2015  . CAD (coronary artery disease), native coronary artery 05/19/2015  . Hyperlipidemia 05/19/2015  . Vitamin D deficiency 05/19/2015  . Pressure ulcer 05/16/2015  .  Diastolic dysfunction with chronic heart failure ([place]) 05/08/2015  . Blisters of multiple sites 05/08/2015  . Type 2 diabetes mellitus ([place])   . Obesity, unspecified   . OSA (obstructive sleep apnea)   . Atrial fibrillation ([place])   . Secondary DM with CKD stage 4 and hypertension ([place])   . Glaucoma   . Cellulitis 05/07/2015    Orientation RESPIRATION BLADDER Height & Weight     Self, Time, Situation, Place  Normal External catheter, Incontinent Weight: (!) 310 lb 7.9 oz (140.8 kg) Height:  5\' 11"  (180.3 cm)  BEHAVIORAL SYMPTOMS/MOOD NEUROLOGICAL BOWEL NUTRITION STATUS      Continent Diet (See DC summary)  AMBULATORY STATUS COMMUNICATION OF NEEDS Skin   Extensive Assist Verbally Other (Comment) (Closed incision on penis)                       Personal Care Assistance Level of Assistance  Bathing, Feeding, Dressing Bathing Assistance: Maximum assistance Feeding assistance: Limited assistance Dressing Assistance: Maximum assistance     Functional Limitations Info  Sight, Hearing, Speech Sight Info: Adequate Hearing Info: Adequate Speech Info: Adequate    SPECIAL CARE FACTORS FREQUENCY   PT    OT    3X per week      2X per week          Contractures      Additional Factors Info  Code Status, Allergies Code Status Info: FULL Allergies Info: Adhesive tape           Current Medications (06/10/2020):  This is the current hospital active medication list Current Facility-Administered Medications  Medication Dose  Route Frequency Provider Last Rate Last Admin  . acetaminophen (TYLENOL) tablet 650 mg  650 mg Oral Q4H PRN Kc, Maren Beach, MD       Or  . acetaminophen (TYLENOL) 160 MG/5ML solution 650 mg  650 mg Per Tube Q4H PRN Kc, Ramesh, MD       Or  . acetaminophen (TYLENOL) suppository 650 mg  650 mg Rectal Q4H PRN Kc, Ramesh, MD      . albuterol (VENTOLIN HFA) 108 (90 Base) MCG/ACT inhaler 2 puff  2 puff Inhalation BID PRN Kc, Ramesh, MD      . apixaban  (ELIQUIS) tablet 2.5 mg  2.5 mg Oral BID Kc, Ramesh, MD   2.5 mg at 06/10/20 1127  . atorvastatin (LIPITOR) tablet 40 mg  40 mg Oral QHS Antonieta Pert, MD   40 mg at 06/09/20 2212  . cholecalciferol (VITAMIN D) tablet 2,000 Units  2,000 Units Oral Daily Antonieta Pert, MD   2,000 Units at 06/10/20 1127  . docusate sodium (COLACE) capsule 100 mg  100 mg Oral QHS Kc, Maren Beach, MD   100 mg at 06/09/20 2212  . gabapentin (NEURONTIN) capsule 200 mg  200 mg Oral QHS Kc, Ramesh, MD   200 mg at 06/09/20 2211  . insulin aspart (novoLOG) injection 0-9 Units  0-9 Units Subcutaneous TID WC Antonieta Pert, MD   2 Units at 06/10/20 0626  . latanoprost (XALATAN) 0.005 % ophthalmic solution 1 drop  1 drop Both Eyes QHS Kc, Ramesh, MD      . loratadine (CLARITIN) tablet 10 mg  10 mg Oral Daily Kc, Ramesh, MD   10 mg at 06/10/20 1127  . magnesium oxide (MAG-OX) tablet 400 mg  400 mg Oral Daily Kc, Ramesh, MD   400 mg at 06/10/20 1128  . melatonin tablet 5 mg  5 mg Oral QHS Kc, Maren Beach, MD   5 mg at 06/09/20 2212  . metoprolol succinate (TOPROL-XL) 24 hr tablet 25 mg  25 mg Oral Daily Kc, Ramesh, MD   25 mg at 06/10/20 1127  . nitroGLYCERIN (NITROSTAT) SL tablet 0.4 mg  0.4 mg Sublingual Q5 min PRN Kc, Maren Beach, MD      . oxybutynin (DITROPAN) tablet 5 mg  5 mg Oral Q8H PRN Kc, Ramesh, MD      . polyethylene glycol (MIRALAX / GLYCOLAX) packet 17 g  17 g Oral Daily Kc, Ramesh, MD      . polyvinyl alcohol (LIQUIFILM TEARS) 1.4 % ophthalmic solution 1 drop  1 drop Both Eyes QID Kc, Ramesh, MD      . senna-docusate (Senokot-S) tablet 1 tablet  1 tablet Oral QHS PRN Kc, Ramesh, MD      . tamsulosin (FLOMAX) capsule 0.4 mg  0.4 mg Oral QPC supper Kc, Ramesh, MD      . vitamin B-12 (CYANOCOBALAMIN) tablet 1,000 mcg  1,000 mcg Oral Daily Kc, Ramesh, MD   1,000 mcg at 06/10/20 1127     Discharge Medications: Please see discharge summary for a list of discharge medications.  Relevant Imaging Results:  Relevant Lab  Results:   Additional Information SSN: [id number]  Glennon Hamilton, Student-Social Work

## 2020-06-10 NOTE — TOC Initial Note (Signed)
Transition of Care [facility]) - Initial/Assessment Note    Patient Details  Name: Reginald [name] MRN: [id number] Date of Birth: 1936-06-30  Transition of [facility]) CM/SW Contact:    Glennon Hamilton, [place] Work Phone Number: 06/10/2020, 2:54 PM  Clinical Narrative:                 CSW spoke to patient about discharge and confirmed with patient that he would be going to [place] at discharge.  Expected Discharge Plan: Skilled Nursing Facility Barriers to Discharge: Continued Medical Work up   Patient Goals and CMS Choice Patient states their goals for this hospitalization and ongoing recovery are:: To feel better      Expected Discharge Plan and Services Expected Discharge Plan: [place] In-house Referral: Clinical Social Work Discharge Planning Services: CM Consult Post Acute Care Choice: [place] Living arrangements for the past 2 months: [place]                 DME Arranged: N/A DME Agency: NA                  Prior Living Arrangements/Services Living arrangements for the past 2 months: [place] Lives with:: Self Patient language and need for interpreter reviewed:: Yes Do you feel safe going back to the place where you live?: Yes      Need for Family Participation in Patient Care: No (Comment) Care giver support system in place?: Yes (comment)   Criminal Activity/Legal Involvement Pertinent to Current Situation/Hospitalization: No - Comment as needed  Activities of Daily Living Home Assistive Devices/Equipment: Eyeglasses, Environmental consultant (specify type), Wheelchair ADL Screening (condition at time of admission) Patient's cognitive ability adequate to safely complete daily activities?: Yes Is the patient deaf or have difficulty hearing?: Yes Does the patient have difficulty seeing, even when wearing glasses/contacts?: No Does the patient have difficulty concentrating, remembering, or making  decisions?: No Patient able to express need for assistance with ADLs?: Yes Does the patient have difficulty dressing or bathing?: Yes Independently performs ADLs?: No Communication: Independent Dressing (OT): Needs assistance Is this a change from baseline?: Pre-admission baseline Grooming: Independent Feeding: Independent Bathing: Needs assistance Is this a change from baseline?: Pre-admission baseline Toileting: Needs assistance Is this a change from baseline?: Pre-admission baseline In/Out Bed: Needs assistance Is this a change from baseline?: Pre-admission baseline Walks in Home: Needs assistance Is this a change from baseline?: Pre-admission baseline Does the patient have difficulty walking or climbing stairs?: Yes Weakness of Legs: None Weakness of Arms/Hands: None  Permission Sought/Granted Permission sought to share information with : Chartered certified accountant granted to share information with : Yes, Verbal Permission Granted     Permission granted to share info w AGENCY: [facility]        Emotional Assessment Appearance:: Appears stated age Attitude/Demeanor/Rapport: Engaged Affect (typically observed): Appropriate, Pleasant Orientation: : Oriented to Self, Oriented to  Time, Oriented to Place, Oriented to Situation Alcohol / Substance Use: Not Applicable    Admission diagnosis:  Vision loss of right eye [H54.61] CRAO (central retinal artery occlusion), 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 Vision loss of right eye 06/09/2020  . Acute on chronic renal failure ([place]) 12/19/2018  . Acute renal failure superimposed on stage 4 chronic kidney disease ([place]) 12/19/2018  . AKI (acute kidney injury) ([place]) 11/26/2018  . HCAP (healthcare-associated pneumonia) 11/15/2018  . Acute respiratory disease due to COVID-19 virus 11/15/2018  .  Chronic venous insufficiency 10/04/2017  . Venous stasis ulcers of both lower extremities ([place])  10/04/2017  . Nonspecific chest pain 10/27/2016  . Morbid obesity ([place]) 05/02/2016  . Lymphedema 05/02/2016  . Abnormal laboratory test result 10/15/2015  . Cough 10/08/2015  . Hematuria 09/21/2015  . Hypokalemia 08/13/2015  . HTN (hypertension) 08/13/2015  . Multiple open wounds of lower leg 07/22/2015  . CKD (chronic kidney disease) stage 4, GFR 15-29 ml/min (HCC) 07/22/2015  . Cellulitis of leg, right 06/28/2015  . ARF (acute renal failure) ([place]) 06/28/2015  . Hypertensive heart disease with CHF (congestive heart failure) ([place]) 05/19/2015  . CAD (coronary artery disease), native coronary artery 05/19/2015  . Hyperlipidemia 05/19/2015  . Vitamin D deficiency 05/19/2015  . Pressure ulcer 05/16/2015  . Diastolic dysfunction with chronic heart failure ([place]) 05/08/2015  . Blisters of multiple sites 05/08/2015  . Type 2 diabetes mellitus ([place])   . Obesity, unspecified   . OSA (obstructive sleep apnea)   . Atrial fibrillation ([place])   . Secondary DM with CKD stage 4 and hypertension ([place])   . Glaucoma   . Cellulitis 05/07/2015   PCP:  Patient, No Pcp Per Pharmacy:   [place], [address] [place] [address] Phone: [phone] Fax: [place], [place] [address] Phone: [phone] Fax: [phone]     Social Determinants of Health (SDOH) Interventions    Readmission Risk Interventions No flowsheet data found.

## 2020-06-10 NOTE — ED Notes (Signed)
Attempted to call report to [facility] bed 20. Nurse is busy at this time and will call back

## 2020-06-10 NOTE — Progress Notes (Signed)
PROGRESS NOTE    Reginald [name]  ENI:[id number] DOB: 04/18/1936 DOA: 06/09/2020 PCP: Patient, No Pcp Per   Brief Narrative:  84 y.o. male with medical history significant for COPD on as needed oxygen, OSA not using CPAP regularly, history of COVID-19 infection, atrial fibrillation on Eliquis, T2DM on sliding scale insulin, hyperlipidemia, history of CAD/stent history of chronic diastolic CHF, CKD IIIa sent from nursing home due to painless vision loss in the right eye, onset around after dinner while watching TV as per the patient.  Patient otherwise denies any numbness tingling focal weakness headache, chest pain shortness of breath, fever, chills.  He reports he uses walker but mostly uses wheelchair to get around.  ED Course:  BP on higher side 140-150s, admission.  ED doctor did discuss with ophthalmologist Dr. Alanda Slim recommend ESR, CRP and imaging of the carotid and vertebral arteries and MRI to evaluate for stroke. MRI with ? [place] stroke, EDP discussed with Dr. Rory Percy from neurology who felt that patient would benefit from TIA work-up with high risk factors, possibly likely acute retinal artery occlusion and [facility] was consulted for admission.  Subjective: Reports he is  able to see some from rt eye esp light- but still with significant vision loss no parasthesias, headache, dysphagia or  Arm or leg weakness  Assessment & Plan:   Acute Vision loss of right eye: no significant improvement.   Ophthalmology was consulted by EDP  advised ESR- ( 10- normal) CRP slightly up at 1.0. MRI  brain/ orbit shows  "Probable punctate subacute infarct of the right precentral gyrus along with small chronic right occipital infarct, moderate chronic microvascular ischemic changes,?  Possibly a small calcified meningioma along the right falx.  CT angio head and neck no acute intracranial abnormalities, prominent soft and calcified plaque in the right carotid bifurcation with resulting right ICA stenosis  proximally 70% patent vertebral artery within the neck, moderate to severe stenosis at both vertebral artery origin.  Await for further neurology input, continue Eliquis, statin.  Echo pending.  COPD/ OSA on cpap-no compliant/history of COVID-19 infection: Respiratory status is stable continue inhaler as needed.    Chronic Atrial fibrillation  continues Eliquis, blood pressure.    T2DM: Blood sugar fairly controlled on sliding scale insulin.  Holding home Metformin.  Hemoglobin A1c pending. Recent Labs  Lab 06/09/20 2306 06/10/20 0611 06/10/20 0813 06/10/20 1208  GLUCAP 142* 159* 173* 180*   Hyperlipidemia: Continue statin, pending fasting lipid profile. history of CAD/stent history of chronic diastolic CHF/Hypertension: allow permissive hypertension.  Continue Eliquis, Toprol.  Aldactone on hold.   CKD IIIa:  Renal function stable.  Monitor  Recent Labs  Lab 06/09/20 1315  BUN 23  CREATININE 1.58*   Chronic venous insufficiency dermatitis of lower extremities.  Supportive care  Morbid obesity with BMI 43, will benefit with weight loss. Nutrition: Diet Order            Diet heart healthy/carb modified Room service appropriate? Yes; Fluid consistency: Thin  Diet effective now                  Body mass index is 43.31 kg/m.  DVT prophylaxis: apixaban (ELIQUIS) tablet 2.5 mg Start: 06/09/20 2200 Code Status:   Code Status: Full Code  Family Communication: plan of care discussed with patient at bedside.  Status is: Admitted as observation Remains hospitalized for ongoing management of diabetes mellitus, further neurology evaluation Dispo: The patient is from: SNF  Anticipated d/c is to: SNF              Anticipated d/c date is: 1 day              Patient currently is not medically stable to d/c.  Consultants:see note  Procedures:see note  Culture/Microbiology    Component Value Date/Time   SDES  05/03/2020 1803    URINE, RANDOM Performed at  [facility], [place] [address] [place], [place] [address]    SPECREQUEST  05/03/2020 1803    NONE Performed at [facility], [place] [address], [place], [place] [address]    CULT MULTIPLE SPECIES PRESENT, SUGGEST RECOLLECTION (A) 05/03/2020 1803   REPTSTATUS 05/05/2020 FINAL 05/03/2020 1803    Other culture-see note  Medications: Scheduled Meds: . apixaban  2.5 mg Oral BID  . atorvastatin  40 mg Oral QHS  . cholecalciferol  2,000 Units Oral Daily  . docusate sodium  100 mg Oral QHS  . gabapentin  200 mg Oral QHS  . insulin aspart  0-9 Units Subcutaneous TID WC  . latanoprost  1 drop Both Eyes QHS  . loratadine  10 mg Oral Daily  . magnesium oxide  400 mg Oral Daily  . melatonin  5 mg Oral QHS  . metoprolol succinate  25 mg Oral Daily  . polyethylene glycol  17 g Oral Daily  . polyvinyl alcohol  1 drop Both Eyes QID  . tamsulosin  0.4 mg Oral QPC supper  . vitamin B-12  1,000 mcg Oral Daily   Continuous Infusions:  Antimicrobials: Anti-infectives (From admission, onward)   None     Objective: Vitals: Today's Vitals   06/10/20 0830 06/10/20 0845 06/10/20 0900 06/10/20 0916  BP:    (!) 182/102  Pulse: 89 (!) 53 67 63  Resp: (!) 35 19 18 (!) 23  Temp:    98.5 F (36.9 C)  TempSrc:      SpO2: 94% 95% 92% 95%  Weight:      Height:      PainSc:       No intake or output data in the 24 hours ending 06/10/20 1018 Filed Weights   06/09/20 1240  Weight: (!) 140.8 kg   Weight change:   Intake/Output from previous day: No intake/output data recorded. Intake/Output this shift: No intake/output data recorded.  Examination: General exam: AAOx3, obese,NAD,weak appearing. HEENT:Oral mucosa moist, Ear/Nose WNL grossly,dentition normal. Respiratory system: bilaterally clear,no wheezing or crackles,no use of accessory muscle, non tender. Cardiovascular system:S1 & S2 +,regular,No JVD. Gastrointestinal system:Abdomen soft,  NT,ND,BS+. Nervous System:Alert, awake,moving extremities and grossly nonfocal, rt eye vision loss Extremities:Chronic LE dermatitis and venous stasis,distal peripheral pulses palpable.  Skin:No rashes,no icterus. ZHY:QMVHQI muscle bulk,tone, power.  Data Reviewed: I have personally reviewed following labs and imaging studies CBC: Recent Labs  Lab 06/09/20 1315  WBC 8.3  NEUTROABS 5.1  HGB 16.6  HCT 52.2*  MCV 93.4  PLT 696   Basic Metabolic Panel: Recent Labs  Lab 06/09/20 1315  NA 138  K 4.4  CL 101  CO2 29  GLUCOSE 177*  BUN 23  CREATININE 1.58*  CALCIUM 9.6   GFR: Estimated Creatinine Clearance: 51.7 mL/min (A) (by C-G formula based on SCr of 1.58 mg/dL (H)). Liver Function Tests: Recent Labs  Lab 06/09/20 1315  AST 28  ALT 32  ALKPHOS 60  BILITOT 1.0  PROT 6.6  ALBUMIN 3.8   No results for input(s): LIPASE, AMYLASE in the last  168 hours. No results for input(s): AMMONIA in the last 168 hours. Coagulation Profile: No results for input(s): INR, PROTIME in the last 168 hours. Cardiac Enzymes: No results for input(s): CKTOTAL, CKMB, CKMBINDEX, TROPONINI in the last 168 hours. BNP (last 3 results) No results for input(s): PROBNP in the last 8760 hours. HbA1C: No results for input(s): HGBA1C in the last 72 hours. CBG: Recent Labs  Lab 06/09/20 2306 06/10/20 0611 06/10/20 0813  GLUCAP 142* 159* 173*   Lipid Profile: No results for input(s): CHOL, HDL, LDLCALC, TRIG, CHOLHDL, LDLDIRECT in the last 72 hours. Thyroid Function Tests: No results for input(s): TSH, T4TOTAL, FREET4, T3FREE, THYROIDAB in the last 72 hours. Anemia Panel: No results for input(s): VITAMINB12, FOLATE, FERRITIN, TIBC, IRON, RETICCTPCT in the last 72 hours. Sepsis Labs: No results for input(s): PROCALCITON, LATICACIDVEN in the last 168 hours.  Recent Results (from the past 240 hour(s))  Resp Panel by RT-PCR (Flu A&B, Covid) Nasopharyngeal Swab     Status: None   Collection  Time: 06/09/20  4:58 PM   Specimen: Nasopharyngeal Swab; Nasopharyngeal(NP) swabs in vial transport medium  Result Value Ref Range Status   SARS Coronavirus 2 by RT PCR NEGATIVE NEGATIVE Final    Comment: (NOTE) SARS-CoV-2 target nucleic acids are NOT DETECTED.  The SARS-CoV-2 RNA is generally detectable in upper respiratory specimens during the acute phase of infection. The lowest concentration of SARS-CoV-2 viral copies this assay can detect is 138 copies/mL. A negative result does not preclude SARS-Cov-2 infection and should not be used as the sole basis for treatment or other patient management decisions. A negative result may occur with  improper specimen collection/handling, submission of specimen other than nasopharyngeal swab, presence of viral mutation(s) within the areas targeted by this assay, and inadequate number of viral copies(<138 copies/mL). A negative result must be combined with clinical observations, patient history, and epidemiological information. The expected result is Negative.  Fact Sheet for Patients:  EntrepreneurPulse.com.au  Fact Sheet for Healthcare Providers:  IncredibleEmployment.be  This test is no t yet approved or cleared by the [place] FDA and  has been authorized for detection and/or diagnosis of SARS-CoV-2 by FDA under an Emergency Use Authorization (EUA). This EUA will remain  in effect (meaning this test can be used) for the duration of the COVID-19 declaration under Section 564(b)(1) of the Act, 21 U.S.C.section 360bbb-3(b)(1), unless the authorization is terminated  or revoked sooner.       Influenza A by PCR NEGATIVE NEGATIVE Final   Influenza B by PCR NEGATIVE NEGATIVE Final    Comment: (NOTE) The Xpert Xpress SARS-CoV-2/FLU/RSV plus assay is intended as an aid in the diagnosis of influenza from Nasopharyngeal swab specimens and should not be used as a sole basis for treatment. Nasal washings  and aspirates are unacceptable for Xpert Xpress SARS-CoV-2/FLU/RSV testing.  Fact Sheet for Patients: EntrepreneurPulse.com.au  Fact Sheet for Healthcare Providers: IncredibleEmployment.be  This test is not yet approved or cleared by the [place] FDA and has been authorized for detection and/or diagnosis of SARS-CoV-2 by FDA under an Emergency Use Authorization (EUA). This EUA will remain in effect (meaning this test can be used) for the duration of the COVID-19 declaration under Section 564(b)(1) of the Act, 21 U.S.C. section 360bbb-3(b)(1), unless the authorization is terminated or revoked.  Performed at [facility], [place] [address] [place], [place] [address]      Radiology Studies: CT Angio Head W or Wo Contrast  Result Date: 06/09/2020 CLINICAL  DATA:  Vision loss, monocular. Additional history provided: Additional history provided: Sudden onset right eye vision loss (symptoms for 2-3 days). EXAM: CT ANGIOGRAPHY HEAD AND NECK TECHNIQUE: Multidetector CT imaging of the head and neck was performed using the standard protocol during bolus administration of intravenous contrast. Multiplanar CT image reconstructions and MIPs were obtained to evaluate the vascular anatomy. Carotid stenosis measurements (when applicable) are obtained utilizing NASCET criteria, using the distal internal carotid diameter as the denominator. CONTRAST:  79m OMNIPAQUE IOHEXOL 350 MG/ML SOLN COMPARISON:  MRI of the brain and orbits performed earlier the same day 06/09/2020. CT angiogram head/neck 01/04/2017. FINDINGS: CT HEAD FINDINGS Brain: Moderate generalized cerebral atrophy. 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 Moderately advanced ill-defined hypoattenuation within the cerebral white matter is nonspecific, but compatible with chronic small vessel ischemic disease. 10 mm lobular focus of dural-based calcification projecting  rightward from the posterior falx, nonspecific but possibly reflecting a small calcified meningioma (series 4, image 23). There is no acute intracranial hemorrhage. No CT evidence of acute cortical infarct. A probable punctate subacute infarct within the right precentral gyrus was better appreciated on the same-day brain MRI. No extra-axial fluid collection. No evidence of intracranial mass. No midline shift. Vascular: No hyperdense vessel.  Atherosclerotic calcifications. Skull: Normal. Negative for fracture or focal lesion. Sinuses: Trace ethmoid, right sphenoid and right maxillary sinus mucosal thickening. Orbits: No mass or acute finding. Other: 2.1 cm left parietal scalp lesion. Review of the MIP images confirms the above findings CTA NECK FINDINGS Aortic arch: Standard aortic branching. Atherosclerotic plaque within the visualized aortic arch and proximal major branch vessels of the neck. No hemodynamically significant innominate or proximal subclavian artery stenosis. Right carotid system: CCA and ICA patent within the neck. Prominent soft and calcified plaque within the carotid bifurcation and proximal ICA. Resultant stenosis of the proximal ICA of up to 70% (series 12, images 148 and 149). Distal to this, the ICA is patent within the neck without stenosis. Partially retropharyngeal course of the ICA Left carotid system: CCA and ICA patent within the neck without significant stenosis (50% or greater). Mild calcified plaque within the carotid bifurcation and proximal ICA. Vertebral arteries: Codominant and patent within the neck bilaterally. Streak and beam hardening artifact arising from the patient's shoulders limits evaluation. However, there is apparent moderate/severe stenosis at the origin of both vertebral arteries. Skeleton: No acute bony abnormality or aggressive osseous lesion. 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 As before, prominent ossification  of the posterior longitudinal ligament contributes to suspected at least moderate spinal canal stenosis at C2-C3, C3-C4 and C4-C5. Other neck: No neck mass or cervical lymphadenopathy. Upper chest: No consolidation within the imaged lung apices. The visualized intrathoracic esophagus is somewhat patulous. Review of the MIP images confirms the above findings CTA HEAD FINDINGS Anterior circulation: The intracranial internal carotid arteries are patent. Calcified plaque within both vessels. No more than mild stenosis on the right. Moderate stenosis of the left paraclinoid segment. The M1 middle cerebral arteries are patent. Moderate stenosis of the proximal M1 left MCA (series 15, image 50). Mild-to-moderate stenosis of the distal M1 left MCA. No M2 proximal branch occlusion or high-grade proximal stenosis. Atherosclerotic irregularity of the M2 and more distal MCA branches bilaterally. Most notably, there is a high-grade stenosis within a superior division proximal M2 left MCA branch vessel (series 17, image 29). The anterior cerebral arteries are patent. Atherosclerotic irregularity of the bilateral anterior cerebral arteries. Most notably, there  is mild-to-moderate atherosclerotic irregularity of both A2 segments and a high-grade focal stenosis within the A4 right ACA (series 7, image 21). No intracranial aneurysm is identified. Posterior circulation: The intracranial vertebral arteries are patent. The basilar artery is patent. The posterior cerebral arteries are patent. Atherosclerotic irregularity of both posterior cerebral arteries. Most notably, there is a high-grade stenosis within the P2 right PCA (series 15, image 50) and a moderate/severe stenosis within a left PCA branch at the P2/P3 junction (series 17, image 25). A small left posterior communicating artery is present. The right posterior communicating artery is hypoplastic or absent. Venous sinuses: Within the limitations of contrast timing, no convincing  thrombus. Anatomic variants: As described Review of the MIP images confirms the above findings IMPRESSION: CT head: 1. No CT evidence of acute intracranial abnormality. A probable punctate subacute infarct within the right precentral gyrus was better appreciated on the same-day brain MRI. 2. 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 3. Moderate cerebral atrophy and moderately advanced chronic small vessel ischemic disease. 4. 10 mm rounded focus of dural-based calcification projecting rightward from the posterior falx, nonspecific but possibly reflecting a small incidental meningioma. 5. 2.1 cm left parietal scalp lesion. Direct visualization recommended CTA neck: 1. The common and internal carotid arteries are patent within the neck. Prominent soft and calcified plaque within the right carotid bifurcation and proximal ICA with resultant stenosis of the proximal right ICA of up to 70% (progressed as compared to the prior CTA of 01/04/2017). 2. Vertebral arteries patent within the neck. Apparent moderate/severe stenoses at both vertebral artery origins. CTA head: 1. No intracranial large vessel occlusion. 2. Intracranial atherosclerotic disease with multifocal stenoses, most notably as follows (and not significantly changed as compared to the CTA of 02/03/2017). 3. Moderate stenosis of the paraclinoid left ICA. 4. Moderate stenosis of the proximal M1 left MCA. 5. High-grade focal stenosis within the A4 right anterior cerebral artery. 6. High-grade stenosis within the P2 right PCA. 7. Moderate/severe focal stenosis within the left PCA at the P2/P3 junction. Electronically Signed   By: Kellie Simmering DO   On: 06/09/2020 16:38   CT Angio Neck W and/or Wo Contrast  Result Date: 06/09/2020 CLINICAL DATA:  Vision loss, monocular. Additional history provided: Additional history provided: Sudden onset right eye vision loss (symptoms for 2-3 days). EXAM: CT ANGIOGRAPHY HEAD AND NECK  TECHNIQUE: Multidetector CT imaging of the head and neck was performed using the standard protocol during bolus administration of intravenous contrast. Multiplanar CT image reconstructions and MIPs were obtained to evaluate the vascular anatomy. Carotid stenosis measurements (when applicable) are obtained utilizing NASCET criteria, using the distal internal carotid diameter as the denominator. CONTRAST:  48m OMNIPAQUE IOHEXOL 350 MG/ML SOLN COMPARISON:  MRI of the brain and orbits performed earlier the same day 06/09/2020. CT angiogram head/neck 01/04/2017. FINDINGS: CT HEAD FINDINGS Brain: Moderate generalized cerebral atrophy. 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 Moderately advanced ill-defined hypoattenuation within the cerebral white matter is nonspecific, but compatible with chronic small vessel ischemic disease. 10 mm lobular focus of dural-based calcification projecting rightward from the posterior falx, nonspecific but possibly reflecting a small calcified meningioma (series 4, image 23). There is no acute intracranial hemorrhage. No CT evidence of acute cortical infarct. A probable punctate subacute infarct within the right precentral gyrus was better appreciated on the same-day brain MRI. No extra-axial fluid collection. No evidence of intracranial mass. No midline shift. Vascular: No hyperdense vessel.  Atherosclerotic calcifications. Skull: Normal.  Negative for fracture or focal lesion. Sinuses: Trace ethmoid, right sphenoid and right maxillary sinus mucosal thickening. Orbits: No mass or acute finding. Other: 2.1 cm left parietal scalp lesion. Review of the MIP images confirms the above findings CTA NECK FINDINGS Aortic arch: Standard aortic branching. Atherosclerotic plaque within the visualized aortic arch and proximal major branch vessels of the neck. No hemodynamically significant innominate or proximal subclavian artery stenosis. Right carotid system: CCA and ICA patent  within the neck. Prominent soft and calcified plaque within the carotid bifurcation and proximal ICA. Resultant stenosis of the proximal ICA of up to 70% (series 12, images 148 and 149). Distal to this, the ICA is patent within the neck without stenosis. Partially retropharyngeal course of the ICA Left carotid system: CCA and ICA patent within the neck without significant stenosis (50% or greater). Mild calcified plaque within the carotid bifurcation and proximal ICA. Vertebral arteries: Codominant and patent within the neck bilaterally. Streak and beam hardening artifact arising from the patient's shoulders limits evaluation. However, there is apparent moderate/severe stenosis at the origin of both vertebral arteries. Skeleton: No acute bony abnormality or aggressive osseous lesion. 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 As before, prominent ossification of the posterior longitudinal ligament contributes to suspected at least moderate spinal canal stenosis at C2-C3, C3-C4 and C4-C5. Other neck: No neck mass or cervical lymphadenopathy. Upper chest: No consolidation within the imaged lung apices. The visualized intrathoracic esophagus is somewhat patulous. Review of the MIP images confirms the above findings CTA HEAD FINDINGS Anterior circulation: The intracranial internal carotid arteries are patent. Calcified plaque within both vessels. No more than mild stenosis on the right. Moderate stenosis of the left paraclinoid segment. The M1 middle cerebral arteries are patent. Moderate stenosis of the proximal M1 left MCA (series 15, image 50). Mild-to-moderate stenosis of the distal M1 left MCA. No M2 proximal branch occlusion or high-grade proximal stenosis. Atherosclerotic irregularity of the M2 and more distal MCA branches bilaterally. Most notably, there is a high-grade stenosis within a superior division proximal M2 left MCA branch vessel (series 17, image 29). The  anterior cerebral arteries are patent. Atherosclerotic irregularity of the bilateral anterior cerebral arteries. Most notably, there is mild-to-moderate atherosclerotic irregularity of both A2 segments and a high-grade focal stenosis within the A4 right ACA (series 7, image 21). No intracranial aneurysm is identified. Posterior circulation: The intracranial vertebral arteries are patent. The basilar artery is patent. The posterior cerebral arteries are patent. Atherosclerotic irregularity of both posterior cerebral arteries. Most notably, there is a high-grade stenosis within the P2 right PCA (series 15, image 50) and a moderate/severe stenosis within a left PCA branch at the P2/P3 junction (series 17, image 25). A small left posterior communicating artery is present. The right posterior communicating artery is hypoplastic or absent. Venous sinuses: Within the limitations of contrast timing, no convincing thrombus. Anatomic variants: As described Review of the MIP images confirms the above findings IMPRESSION: CT head: 1. No CT evidence of acute intracranial abnormality. A probable punctate subacute infarct within the right precentral gyrus was better appreciated on the same-day brain MRI. 2. 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 3. Moderate cerebral atrophy and moderately advanced chronic small vessel ischemic disease. 4. 10 mm rounded focus of dural-based calcification projecting rightward from the posterior falx, nonspecific but possibly reflecting a small incidental meningioma. 5. 2.1 cm left parietal scalp lesion. Direct visualization recommended CTA neck: 1. The common and internal carotid arteries  are patent within the neck. Prominent soft and calcified plaque within the right carotid bifurcation and proximal ICA with resultant stenosis of the proximal right ICA of up to 70% (progressed as compared to the prior CTA of 01/04/2017). 2. Vertebral arteries patent within the  neck. Apparent moderate/severe stenoses at both vertebral artery origins. CTA head: 1. No intracranial large vessel occlusion. 2. Intracranial atherosclerotic disease with multifocal stenoses, most notably as follows (and not significantly changed as compared to the CTA of 02/03/2017). 3. Moderate stenosis of the paraclinoid left ICA. 4. Moderate stenosis of the proximal M1 left MCA. 5. High-grade focal stenosis within the A4 right anterior cerebral artery. 6. High-grade stenosis within the P2 right PCA. 7. Moderate/severe focal stenosis within the left PCA at the P2/P3 junction. Electronically Signed   By: Kellie Simmering DO   On: 06/09/2020 16:38   MR BRAIN WO CONTRAST  Result Date: 06/09/2020 CLINICAL DATA:  Right eye vision loss EXAM: MRI HEAD AND ORBITS WITHOUT CONTRAST TECHNIQUE: Multiplanar, multiecho pulse sequences of the brain and surrounding structures were obtained without intravenous contrast. Multiplanar, multiecho pulse sequences of the orbits and surrounding structures were obtained including fat saturation techniques, without intravenous contrast administration. COMPARISON:  2019 FINDINGS: MRI HEAD FINDINGS Brain: A punctate focus of diffusion hyperintensity with ADC isointensity is present along the right precentral gyrus. Prominence of the ventricles and sulci reflects generalized parenchymal volume loss similar to the prior study. There is a small chronic right occipital infarct. Other patchy and confluent areas of T2 hyperintensity in the supratentorial white matter nonspecific but probably reflect similar moderate chronic microvascular ischemic changes. There are chronic small vessel infarcts and/or prominent perivascular spaces of the right centrum semiovale and bilateral basal ganglia. Few small scattered foci of susceptibility hypointensity likely reflecting chronic microhemorrhages. More prominent susceptibility in the parasagittal right parietal region could also reflect a calcified  meningioma, noting possible extra-axial lesion along the falx on series 14, image 9. There is no intracranial mass or mass effect. No extra-axial collection. Vascular: Major vessel flow voids at the skull base are preserved. Skull and upper cervical spine: Marrow signal is within normal limits. Other: Minimal patchy fluid opacification of the mastoids. New nonspecific abnormal signal along the high left frontoparietal scalp (series 13, image 17). MRI ORBITS FINDINGS Orbits: No proptosis. No intraorbital mass. Globes, extraocular muscles, and lacrimal glands are symmetric and unremarkable. Optic nerve sheath complexes appear unremarkable on this noncontrast study. Visualized sinuses: Mild mucosal thickening. Soft tissues: Unremarkable. IMPRESSION: Probable punctate subacute infarct of the right precentral gyrus. Small chronic right occipital infarct. Moderate chronic microvascular ischemic changes. Question of a possible small calcified meningioma along the right falx. New nonspecific abnormal signal along the high left frontoparietal scalp (series 13, image 17). Correlate for scalp lesion on exam. Electronically Signed   By: Macy Mis M.D.   On: 06/09/2020 15:21   MR ORBITS WO CONTRAST  Result Date: 06/09/2020 CLINICAL DATA:  Right eye vision loss EXAM: MRI HEAD AND ORBITS WITHOUT CONTRAST TECHNIQUE: Multiplanar, multiecho pulse sequences of the brain and surrounding structures were obtained without intravenous contrast. Multiplanar, multiecho pulse sequences of the orbits and surrounding structures were obtained including fat saturation techniques, without intravenous contrast administration. COMPARISON:  2019 FINDINGS: MRI HEAD FINDINGS Brain: A punctate focus of diffusion hyperintensity with ADC isointensity is present along the right precentral gyrus. Prominence of the ventricles and sulci reflects generalized parenchymal volume loss similar to the prior study. There is a small chronic right occipital  infarct. Other patchy and confluent areas of T2 hyperintensity in the supratentorial white matter nonspecific but probably reflect similar moderate chronic microvascular ischemic changes. There are chronic small vessel infarcts and/or prominent perivascular spaces of the right centrum semiovale and bilateral basal ganglia. Few small scattered foci of susceptibility hypointensity likely reflecting chronic microhemorrhages. More prominent susceptibility in the parasagittal right parietal region could also reflect a calcified meningioma, noting possible extra-axial lesion along the falx on series 14, image 9. There is no intracranial mass or mass effect. No extra-axial collection. Vascular: Major vessel flow voids at the skull base are preserved. Skull and upper cervical spine: Marrow signal is within normal limits. Other: Minimal patchy fluid opacification of the mastoids. New nonspecific abnormal signal along the high left frontoparietal scalp (series 13, image 17). MRI ORBITS FINDINGS Orbits: No proptosis. No intraorbital mass. Globes, extraocular muscles, and lacrimal glands are symmetric and unremarkable. Optic nerve sheath complexes appear unremarkable on this noncontrast study. Visualized sinuses: Mild mucosal thickening. Soft tissues: Unremarkable. IMPRESSION: Probable punctate subacute infarct of the right precentral gyrus. Small chronic right occipital infarct. Moderate chronic microvascular ischemic changes. Question of a possible small calcified meningioma along the right falx. New nonspecific abnormal signal along the high left frontoparietal scalp (series 13, image 17). Correlate for scalp lesion on exam. Electronically Signed   By: Macy Mis M.D.   On: 06/09/2020 15:21     LOS: 0 days   Antonieta Pert, MD [facility]  06/10/2020, 10:18 AM

## 2020-06-10 NOTE — Consult Note (Signed)
Neurology Consultation  Reason for Consult: Sudden onset painless right eye visual loss Referring Physician: Dr. Antonieta Pert  CC: Right eye visual loss  History is obtained from: Patient, chart  HPI: Reginald [name] is a 84 y.o. male past medical history of COPD, coronary artery disease, hypertension, hyperlipidemia, obstructive sleep apnea, diabetes, congestive heart failure, CKD, atrial fibrillation on anticoagulation at home with apixaban, presented to the emergency room for evaluation of sudden onset of painless vision loss in the right eye Sunday afternoon. He says he was watching football game on Sunday, 06/07/2020 when he had a sudden onset of his right eye vision going black.  He noticed a curtainlike appearance going through his right eye and then he stopped seeing from that eye.  He cannot perceive light in that eye.  Denies any problems with the left eye.  Denies any tingling numbness weakness that is new. Denies any headache chest pain shortness of breath nausea vomiting.  Lives at a nursing home [facility] for the last 2 years due to lower extremity weakness and inability to walk.  Prior to that was using a walker to walk.  LKW: Noon 06/07/2020 tpa given?: no, outside the window Premorbid modified Rankin scale (mRS):4   ROS: Performed and negative except as noted in HPI.  Past Medical History:  Diagnosis Date  . Anteroseptal myocardial infarction ([place])   . CHF (congestive heart failure) ([place])   . Chronic kidney disease   . COPD (chronic obstructive pulmonary disease) ([place])   . Coronary atherosclerosis of unspecified type of vessel, native or graft    a. s/p prior PCI to LAD in 1997  . Diverticulosis   . ED (erectile dysfunction)   . Foley catheter in place   . Glaucoma   . Hematuria   . History of 2019 novel coronavirus disease (COVID-19) 11/2018  . Hydronephrosis, bilateral   . Hydroureter   . Hypercholesteremia   . Hypertensive renal disease   .  Hypogonadism male   . Hypokalemia   . Obesity hypoventilation syndrome ([place])   . Obesity, unspecified   . OSA (obstructive sleep apnea)   . Peripheral vascular disease ([place])   . Permanent atrial fibrillation ([place])   . Phimosis   . Pneumonia   . Rhabdomyolysis   . Tremor   . Type II or unspecified type diabetes mellitus without mention of complication, not stated as uncontrolled   . Unspecified essential hypertension   . Urinary incontinence   . Urinary retention   . Vestibulopathy     Family History  Problem Relation Age of Onset  . COPD Mother   . Heart disease Mother   . Diabetes Father   . Heart disease Father   . Diabetes Sister   . Heart disease Brother   . Heart disease Brother   . Breast cancer Maternal Aunt   . Diabetes Paternal Grandmother   . Colon cancer Maternal Aunt   . Ovarian cancer Daughter   . Glaucoma Other     Social History:   reports that he has never smoked. He has never used smokeless tobacco. He reports that he does not drink alcohol and does not use drugs.  Medications  Current Facility-Administered Medications:  .  acetaminophen (TYLENOL) tablet 650 mg, 650 mg, Oral, Q4H PRN **OR** acetaminophen (TYLENOL) 160 MG/5ML solution 650 mg, 650 mg, Per Tube, Q4H PRN **OR** acetaminophen (TYLENOL) suppository 650 mg, 650 mg, Rectal, Q4H PRN, Kc, Ramesh, MD .  albuterol (VENTOLIN HFA) 108 (90 Base)  MCG/ACT inhaler 2 puff, 2 puff, Inhalation, BID PRN, Kc, Ramesh, MD .  apixaban (ELIQUIS) tablet 2.5 mg, 2.5 mg, Oral, BID, Kc, Ramesh, MD, 2.5 mg at 06/10/20 1127 .  atorvastatin (LIPITOR) tablet 40 mg, 40 mg, Oral, QHS, Kc, Ramesh, MD, 40 mg at 06/09/20 2212 .  cholecalciferol (VITAMIN D) tablet 2,000 Units, 2,000 Units, Oral, Daily, Kc, Ramesh, MD, 2,000 Units at 06/10/20 1127 .  docusate sodium (COLACE) capsule 100 mg, 100 mg, Oral, QHS, Kc, Ramesh, MD, 100 mg at 06/09/20 2212 .  gabapentin (NEURONTIN) capsule 200 mg, 200 mg, Oral, QHS, Kc, Ramesh, MD, 200  mg at 06/09/20 2211 .  insulin aspart (novoLOG) injection 0-9 Units, 0-9 Units, Subcutaneous, TID WC, Kc, Ramesh, MD, 2 Units at 06/10/20 0998 .  latanoprost (XALATAN) 0.005 % ophthalmic solution 1 drop, 1 drop, Both Eyes, QHS, Kc, Ramesh, MD .  loratadine (CLARITIN) tablet 10 mg, 10 mg, Oral, Daily, Kc, Ramesh, MD, 10 mg at 06/10/20 1127 .  magnesium oxide (MAG-OX) tablet 400 mg, 400 mg, Oral, Daily, Kc, Ramesh, MD, 400 mg at 06/10/20 1128 .  melatonin tablet 5 mg, 5 mg, Oral, QHS, Kc, Ramesh, MD, 5 mg at 06/09/20 2212 .  metoprolol succinate (TOPROL-XL) 24 hr tablet 25 mg, 25 mg, Oral, Daily, Kc, Ramesh, MD, 25 mg at 06/10/20 1127 .  nitroGLYCERIN (NITROSTAT) SL tablet 0.4 mg, 0.4 mg, Sublingual, Q5 min PRN, Kc, Ramesh, MD .  oxybutynin (DITROPAN) tablet 5 mg, 5 mg, Oral, Q8H PRN, Kc, Ramesh, MD .  polyethylene glycol (MIRALAX / GLYCOLAX) packet 17 g, 17 g, Oral, Daily, Kc, Ramesh, MD .  polyvinyl alcohol (LIQUIFILM TEARS) 1.4 % ophthalmic solution 1 drop, 1 drop, Both Eyes, QID, Kc, Ramesh, MD .  senna-docusate (Senokot-S) tablet 1 tablet, 1 tablet, Oral, QHS PRN, Kc, Ramesh, MD .  tamsulosin (FLOMAX) capsule 0.4 mg, 0.4 mg, Oral, QPC supper, Kc, Ramesh, MD .  vitamin B-12 (CYANOCOBALAMIN) tablet 1,000 mcg, 1,000 mcg, Oral, Daily, Kc, Ramesh, MD, 1,000 mcg at 06/10/20 1127  Exam: Current vital signs: BP (!) 144/94 (BP Location: Left Arm)   Pulse 69   Temp 97.6 F (36.4 C) (Oral)   Resp 18   Ht 5\' 11"  (1.803 m)   Wt (!) 140.8 kg   SpO2 92%   BMI 43.31 kg/m  Vital signs in last 24 hours: Temp:  [97.6 F (36.4 C)-98.7 F (37.1 C)] 97.6 F (36.4 C) (12/01 1019) Pulse Rate:  [41-113] 69 (12/01 1019) Resp:  [12-35] 18 (12/01 1019) BP: (129-182)/(56-120) 144/94 (12/01 1019) SpO2:  [87 %-96 %] 92 % (12/01 1019) Weight:  [140.8 kg] 140.8 kg (11/30 1240) General: Awake alert in no distress HEENT: Normocephalic atraumatic, bandage on the vertex of the scalp. Cardiovascular: Regular  rate rhythm Abdomen: Obese nontender Extremities: Edema in both lower extremities with discoloration and changes of chronic venous stasis in both lower extremities. Neurological exam Mental status: Awake alert oriented x3 Speech and language: Speech is nondysarthric, No evidence of aphasia Cranial nerves: Pupils equal round reactive to light with the right pupil being a little sluggish to react to light, he has no light perception in the right eye, left eye has normal acuity, no restriction of extraocular movements, fields appear full on the left eye to confrontation, face appears symmetric, tongue and palate midline. Motor exam: Both upper extremities are antigravity without any vertical drift with normal tone.  Both lower extremities are barely antigravity-that is his baseline-he is wheelchair-bound for the last 2 years  due to lower extremity weakness. Sensory intact to touch Coordination: No dysmetria in upper extremities. NIH stroke scale 1a Level of Conscious.: 0 1b LOC Questions: 0 1c LOC Commands: 0 2 Best Gaze: 0 3 Visual: 3 4 Facial Palsy: 0 5a Motor Arm - left: 0 5b Motor Arm - Right: 0 6a Motor Leg - Left: 3 6b Motor Leg - Right: 3 7 Limb Ataxia: 0 8 Sensory: 0 9 Best Language: 0 10 Dysarthria: 0 11 Extinct. and Inatten.: 0 TOTAL: 9  Labs I have reviewed labs in epic and the results pertinent to this consultation are:  CBC    Component Value Date/Time   WBC 8.3 06/09/2020 1315   RBC 5.59 06/09/2020 1315   HGB 16.6 06/09/2020 1315   HCT 52.2 (H) 06/09/2020 1315   PLT 177 06/09/2020 1315   MCV 93.4 06/09/2020 1315   MCH 29.7 06/09/2020 1315   MCHC 31.8 06/09/2020 1315   RDW 14.1 06/09/2020 1315   LYMPHSABS 1.9 06/09/2020 1315   MONOABS 0.7 06/09/2020 1315   EOSABS 0.4 06/09/2020 1315   BASOSABS 0.1 06/09/2020 1315    CMP     Component Value Date/Time   NA 138 06/09/2020 1315   K 4.4 06/09/2020 1315   CL 101 06/09/2020 1315   CO2 29 06/09/2020 1315    GLUCOSE 177 (H) 06/09/2020 1315   BUN 23 06/09/2020 1315   CREATININE 1.58 (H) 06/09/2020 1315   CALCIUM 9.6 06/09/2020 1315   PROT 6.6 06/09/2020 1315   ALBUMIN 3.8 06/09/2020 1315   AST 28 06/09/2020 1315   ALT 32 06/09/2020 1315   ALKPHOS 60 06/09/2020 1315   BILITOT 1.0 06/09/2020 1315   GFRNONAA 43 (L) 06/09/2020 1315   GFRAA 34 (L) 12/25/2018 0627   A1c lipid panel pending.  Imaging I have reviewed the images obtained: CT-scan of the brain-no acute changes MRI examination of the brain and orbits with a probable punctate subacute infarct in the right precentral gyrus-versus artifact.  Small chronic right occipital infarct.  Question of a possible small calcified meningioma along the right falx.  Nonspecific abnormal signal along the high left frontoparietal scalp-correlate for scalp lesions. CT angio of the head and neck reveals prominent soft and calcified plaque within the right carotid bifurcation and proximal ICA with resultant proximal 70% stenosis of the right ICA that has progressed compared to the CTA of 2018. There is also moderate stenosis of the paraclinoid left ICA, proximal left M1, high-grade focal stenosis within the right A4 ACA and high-grade stenosis within the P2 PCA segment on the right along with moderate to severe focal stenosis of the left PCA P2 P3 junction.  Assessment:  84 year old man with multiple cerebrovascular risk factors presenting with sudden onset of vision loss that was painless in his right eye on noon Sunday when he was watching football game. No other focal neurological deficits noted on exam. History and examination consistent with a central retinal artery occlusion. MRI of the brain and orbits showed a probable subacute infarct in the right precentral gyrus versus artifact-that, even if it is a real stroke, could not explain right eye visual loss. I suspect that the CRAO might be because of the symptomatic right carotid plaque but given his  history of atrial fibrillation, an embolic phenomenon is also in consideration.  He is currently anticoagulated with Eliquis.  Needs further stroke work-up as below   Impression: Evaluate for right central retinal artery occlusion Possible right precentral gyrus embolic punctate stroke  Recommendations: -Continue frequent neurochecks -Echocardiogram completed-reports pending -A1c and lipid panel collected and pending -Continue his Eliquis for now. -He will need an outpatient dilated ophthalmological exam. -I would recommend vascular surgery consultation-I will reach out to the on-call vascular surgeon to request a consultation for revascularization of the right carotid. -PT OT speech therapy -N.p.o. until cleared by bedside swallow evaluation. -No need for permissive hypertension-his last known normal was at least 3 days ago.  Goal blood pressure on discharge should be less than 140/90. Stroke team will continue to follow with you.  -- Amie [place], MD [facility] Pager: [phone] If 7pm to 7am, please call on call as listed on AMION.

## 2020-06-10 NOTE — Evaluation (Signed)
Physical Therapy Evaluation Patient Details Name: Reginald [name] MRN: [id number] DOB: 26-Jun-1936 Today's Date: 06/10/2020   History of Present Illness  Patient is an 84 year old LTC resident at [facility] admited 11/30 d/t painless vision loss in the right eye. MRI shows questionable recent stroke with neurology consulted, patient with possible acute retinal artery occlusion. PMH includes COPD, OSA, history of COVID-19 infection, a-fib on Eliquis, T2DM, hyperlipidemia, history of CAD/stent, history of chronic diastolic CHF, CKD IIIa  Clinical Impression  The patient  Presents with right vision deficits. Patient did  mobilize to sitting up with mod assistance and stood at [employer], took small side steps along stretcher. Patient resides in [place] at [place]. Patient reports mod Independent with WC transfers and ambulated short distances with RW with assistance.Plans are to return to [place]. Pt admitted with above diagnosis.  Pt currently with functional limitations due to the deficits listed below (see PT Problem List). Pt will benefit from skilled PT to increase their independence and safety with mobility to allow discharge to the venue listed below.       Follow Up Recommendations SNF    Equipment Recommendations  None recommended by PT    Recommendations for Other Services       Precautions / Restrictions Precautions Precautions: Fall Precaution Comments: right  eye vision loss Restrictions Weight Bearing Restrictions: No      Mobility  Bed Mobility Overal bed mobility: Needs Assistance Bed Mobility: Supine to Sit;Sit to Supine     Supine to sit: Mod assist;+2 for physical assistance;+2 for safety/equipment;HOB elevated Sit to supine: Mod assist;+2 for physical assistance;+2 for safety/equipment   General bed mobility comments: mod A x2 for trunk and LE management, patient reports legs feeling more weak since not moving around much    Transfers Overall transfer level: Needs  assistance Equipment used: Rolling walker (2 wheeled) Transfers: Sit to/from Stand Sit to Stand: Min assist;+2 safety/equipment         General transfer comment: stood a t RW x 1 with min assistance/steady assistance.  Ambulation/Gait Ambulation/Gait assistance: Min assist   Assistive device: Rolling walker (2 wheeled)       General Gait Details: 4 side steps along the bed.  Stairs            Wheelchair Mobility    Modified Rankin (Stroke Patients Only)       Balance Overall balance assessment: Needs assistance Sitting-balance support: Feet supported Sitting balance-Leahy Scale: Fair     Standing balance support: Bilateral upper extremity supported;During functional activity Standing balance-Leahy Scale: Poor Standing balance comment: reliant on external support                             Pertinent Vitals/Pain Pain Assessment: No/denies pain    Home Living Family/patient expects to be discharged to:: Skilled nursing facility                      Prior Function Level of Independence: Needs assistance   Gait / Transfers Assistance Needed: patient reports ambulating with walker ~80ft increments with supervision, primarily utilizes w/c for mobility and can propel with UE/LEs  ADL's / Homemaking Assistance Needed: patient has assist for ADLs dressing, bathing, toileting        Hand Dominance   Dominant Hand: Right    Extremity/Trunk Assessment   Upper Extremity Assessment Upper Extremity Assessment: Overall WFL for tasks assessed    Lower Extremity  Assessment Lower Extremity Assessment: Generalized weakness    Cervical / Trunk Assessment Cervical / Trunk Assessment: Normal  Communication   Communication: No difficulties;HOH  Cognition Arousal/Alertness: Awake/alert Behavior During Therapy: WFL for tasks assessed/performed Overall Cognitive Status: Within Functional Limits for tasks assessed                                         General Comments      Exercises     Assessment/Plan    PT Assessment Patient needs continued PT services  PT Problem List Decreased strength;Decreased mobility;Decreased safety awareness;Decreased knowledge of precautions;Decreased activity tolerance;Decreased balance;Decreased knowledge of use of DME       PT Treatment Interventions DME instruction;Gait training;Functional mobility training;Therapeutic exercise;Therapeutic activities    PT Goals (Current goals can be found in the Care Plan section)  Acute Rehab PT Goals Patient Stated Goal: "my legs are getting weaker" PT Goal Formulation: With patient Time For Goal Achievement: 06/24/20 Potential to Achieve Goals: Good    Frequency Min 2X/week   Barriers to discharge        Co-evaluation PT/OT/SLP Co-Evaluation/Treatment: Yes Reason for Co-Treatment: For patient/therapist safety PT goals addressed during session: Mobility/safety with mobility OT goals addressed during session: ADL's and self-care       AM-PAC PT "6 Clicks" Mobility  Outcome Measure Help needed turning from your back to your side while in a flat bed without using bedrails?: A Lot Help needed moving from lying on your back to sitting on the side of a flat bed without using bedrails?: A Lot Help needed moving to and from a bed to a chair (including a wheelchair)?: A Lot Help needed standing up from a chair using your arms (e.g., wheelchair or bedside chair)?: A Lot Help needed to walk in hospital room?: A Lot Help needed climbing 3-5 steps with a railing? : Total 6 Click Score: 11    End of Session   Activity Tolerance: Patient tolerated treatment well Patient left: in bed;with call bell/phone within reach Nurse Communication: Mobility status PT Visit Diagnosis: Unsteadiness on feet (R26.81);Difficulty in walking, not elsewhere classified (R26.2)    Time: 4580-9983 PT Time Calculation (min) (ACUTE ONLY): 16  min   Charges:   PT Evaluation $PT Eval Low Complexity: [place] PT Acute Rehabilitation Services Pager [phone] Office [phone]   Claretha Cooper 06/10/2020, 12:36 PM

## 2020-06-10 NOTE — Progress Notes (Signed)
  Echocardiogram 2D Echocardiogram with 3D and defintiy has been performed.  Darlina Sicilian M 06/10/2020, 8:12 AM

## 2020-06-10 NOTE — ED Notes (Signed)
[facility] called for transport, [facility] are busy at this time but will get here when can.

## 2020-06-10 NOTE — ED Notes (Signed)
Pt left with [facility] 

## 2020-06-10 NOTE — Consult Note (Addendum)
Hospital Consult    Reason for Consult: stroke Requesting Physician: Dr. Rory Percy MRN #:  [id number]  History of Present Illness: This is a 84 y.o. male who is familiar to [facility] for chronic lower extremity venous insufficiency. He has multiple medical comorbidities including hypertension, hyperlipidemia, CAD, diabetes, CHF, CKD, afib on apixaban, and COPD. He currently resides at an assisted living facility and he explains that on Sunday he was watching football when suddenly his vision in his right eye went completely black. He denies any pain associated with this. He reports that currently he still has no vision out of his right eye. He reports that he does have history of Glaucoma, cataracts and has had lazer surgery on his right eye earlier this year, but states he has never had anything like this before. He did have feeling of "grain of sand" in his eye last week and was seen at his Opthalmologist office, but says that no abnormalities were found. He otherwise denies any amaurosis fugax. He also denies any other neurological deficits at the time that this occurred. He denies any slurred speech, facial drooping, headache, weakness or numbness of his upper or lower extremities. He does have generalized weakness of his lower extremities, which is unchanged. He says he does ambulate very minimally with walker and mainly uses a wheelchair. He reports no prior history of stoke  Past Medical History:  Diagnosis Date  . Anteroseptal myocardial infarction ([place])   . CHF (congestive heart failure) ([place])   . Chronic kidney disease   . COPD (chronic obstructive pulmonary disease) ([place])   . Coronary atherosclerosis of unspecified type of vessel, native or graft    a. s/p prior PCI to LAD in 1997  . Diverticulosis   . ED (erectile dysfunction)   . Foley catheter in place   . Glaucoma   . Hematuria   . History of 2019 novel coronavirus disease (COVID-19) 11/2018  . Hydronephrosis, bilateral   . Hydroureter    . Hypercholesteremia   . Hypertensive renal disease   . Hypogonadism male   . Hypokalemia   . Obesity hypoventilation syndrome ([place])   . Obesity, unspecified   . OSA (obstructive sleep apnea)   . Peripheral vascular disease ([place])   . Permanent atrial fibrillation ([place])   . Phimosis   . Pneumonia   . Rhabdomyolysis   . Tremor   . Type II or unspecified type diabetes mellitus without mention of complication, not stated as uncontrolled   . Unspecified essential hypertension   . Urinary incontinence   . Urinary retention   . Vestibulopathy     Past Surgical History:  Procedure Laterality Date  . APPENDECTOMY  1985  . CARDIAC CATHETERIZATION    . CHOLECYSTECTOMY  06/2000  . CIRCUMCISION N/A 05/28/2020   Procedure: CIRCUMCISION ADULT;  Surgeon: Franchot Gallo, MD;  Location: [facility];  Service: Urology;  Laterality: N/A;  . CORONARY ANGIOPLASTY  08/14/1995   stent placement to LAD   . DORSAL SLIT N/A 05/28/2020   Procedure: DORSAL SLIT;  Surgeon: Franchot Gallo, MD;  Location: [facility];  Service: Urology;  Laterality: N/A;  42 MINS  . ENDOVENOUS ABLATION SAPHENOUS VEIN W/ LASER Left 02/21/2018   endovenous laser ablation L [facility] by Ruta Hinds MD   . [place] Right 1985  . KNEE ARTHROSCOPY Left 1991  . LUMBAR FUSION    . NOSE SURGERY  1970s   Per Dr. Terrence Dupont [facility] in pt chart    Allergies  Allergen  Reactions  . Adhesive [Tape] Rash    Prior to Admission medications   Medication Sig Start Date End Date Taking? Authorizing Provider  acetaminophen (TYLENOL) 325 MG tablet Take 650 mg by mouth every 4 (four) hours as needed for moderate pain.    Yes [provider]  albuterol (VENTOLIN HFA) 108 (90 Base) MCG/ACT inhaler Inhale 2 puffs into the lungs 2 (two) times daily as needed for wheezing or shortness of breath.   Yes [provider]  apixaban (ELIQUIS) 2.5 MG TABS tablet Take 2.5 mg by mouth 2 (two) times daily.   Yes [provider]  atorvastatin (LIPITOR) 40 MG tablet Take 40 mg by mouth at bedtime.    Yes [provider]  cetirizine (ZYRTEC) 10 MG tablet Take 5 mg by mouth at bedtime.    Yes [provider]  cholecalciferol (VITAMIN D) 25 MCG (1000 UNIT) tablet Take 2,000 Units by mouth daily.   Yes [provider]  docusate sodium (COLACE) 100 MG capsule Take 100 mg by mouth at bedtime.   Yes [provider]  Dulaglutide (TRULICITY) 3 KG/4.0NU SOPN Inject 3 mg into the skin once a week. Mondays   Yes [provider]  gabapentin (NEURONTIN) 100 MG capsule Take 200 mg by mouth at bedtime.    Yes [provider]  hydroxypropyl methylcellulose / hypromellose (ISOPTO TEARS / GONIOVISC) 2.5 % ophthalmic solution Place 1 drop into both eyes 4 (four) times daily.   Yes [provider]  insulin lispro (HUMALOG) 100 UNIT/ML injection Inject 0-14 Units into the skin See admin instructions. Inject 0-14 units subcutaneously twice daily per sliding scale: CBG 0-200 0 units, 201-250 2 units, 251-300 4 units, 301-350 6 units, 351-400 8 units, 401-450 10 units, 451-500 12 units, >500 14 units If blood sugar is > 400 or < 80 call pec triage   Yes [provider]  latanoprost (XALATAN) 0.005 % ophthalmic solution Place 1 drop into both eyes at bedtime. 01/03/17  Yes [provider]  magnesium oxide (MAG-OX) 400 MG tablet Take 400 mg by mouth daily.   Yes [provider]  Melatonin 5 MG TABS Take 5 mg by mouth at bedtime.   Yes [provider]  Menthol, Topical Analgesic, (BIOFREEZE) 4 % GEL Apply 1 application topically 2 (two) times daily as needed (forehead and left arm pain).   Yes [provider]  metFORMIN (GLUCOPHAGE-XR) 750 MG 24 hr tablet Take 750 mg by mouth daily.    Yes [provider]  metoprolol succinate (TOPROL-XL) 25 MG 24 hr tablet Take 25 mg by mouth daily.   Yes [provider]   nitroGLYCERIN (NITROSTAT) 0.4 MG SL tablet Place 0.4 mg under the tongue every 5 (five) minutes as needed for chest pain.   Yes [provider]  oxybutynin (DITROPAN) 5 MG tablet Take 5 mg by mouth every 8 (eight) hours as needed for bladder spasms.  05/07/20  Yes [provider]  polyethylene glycol (MIRALAX / GLYCOLAX) 17 g packet Take 17 g by mouth daily. Mix in 6 oz fluid and drink   Yes [provider]  Skin Protectants, Misc. (EUCERIN) cream Apply 1 application topically 2 (two) times a day. For itching   Yes [provider]  spironolactone (ALDACTONE) 25 MG tablet Take 12.5 mg by mouth daily.   Yes [provider]  tamsulosin (FLOMAX) 0.4 MG CAPS capsule Take 1 capsule (0.4 mg total) by mouth daily after supper. 12/25/18  Yes Irene Pap N, DO  vitamin B-12 (CYANOCOBALAMIN) 1000 MCG tablet Take 1,000 mcg by mouth daily.   Yes [provider]    Social History   Socioeconomic History  . Marital status: Single    Spouse name: Not on file  . Number of children: 5  . Years of education: Not on file  . Highest education level: Not on file  Occupational History  . Occupation: retired  Tobacco Use  . Smoking status: Never Smoker  . Smokeless tobacco: Never Used  Vaping Use  . Vaping Use: Never used  Substance and Sexual Activity  . Alcohol use: No    Alcohol/week: 0.0 standard drinks  . Drug use: No  . Sexual activity: Never  Other Topics Concern  . Not on file  Social History Narrative  . Not on file   Social Determinants of Health   Financial Resource Strain:   . Difficulty of Paying Living Expenses: Not on file  Food Insecurity:   . Worried About Charity fundraiser in the Last Year: Not on file  . Ran Out of Food in the Last Year: Not on file  Transportation Needs:   . Lack of Transportation (Medical): Not on file  . Lack of Transportation (Non-Medical): Not on file  Physical Activity:   . Days of Exercise per  Week: Not on file  . Minutes of Exercise per Session: Not on file  Stress:   . Feeling of Stress : Not on file  Social Connections:   . Frequency of Communication with Friends and Family: Not on file  . Frequency of Social Gatherings with Friends and Family: Not on file  . Attends Religious Services: Not on file  . Active Member of Clubs or Organizations: Not on file  . Attends Archivist Meetings: Not on file  . Marital Status: Not on file  Intimate Partner Violence:   . Fear of Current or Ex-Partner: Not on file  . Emotionally Abused: Not on file  . Physically Abused: Not on file  . Sexually Abused: Not on file     Family History  Problem Relation Age of Onset  . COPD Mother   . Heart disease Mother   . Diabetes Father   . Heart disease Father   . Diabetes Sister   . Heart disease Brother   . Heart disease Brother   . Breast cancer Maternal Aunt   . Diabetes Paternal Grandmother   . Colon cancer Maternal Aunt   . Ovarian cancer Daughter   . Glaucoma Other     ROS: Otherwise negative unless mentioned in HPI  Physical Examination  Vitals:   06/10/20 1019 06/10/20 1509  BP: (!) 144/94 (!) 166/84  Pulse: 69 75  Resp: 18 18  Temp: 97.6 F (36.4 C) 98.2 F (36.8 C)  SpO2: 92% 93%   Body mass index is 43.31 kg/m.  General:  WDWN in NAD Gait: Not observed HENT: WNL, normocephalic Pulmonary: normal non-labored breathing, without wheezing Cardiac: regular, without  Murmurswithout carotid bruits Abdomen: obese, soft, NT/ND, no masses Vascular Exam/Pulses: 2+ radial and brachial pulses bilaterally, 2+ DP pulses bilaterally, PT pulses hard to palpate due to bilateral lower extremity edema. Chronic venous changes to bilateral lower extremities Musculoskeletal: no muscle wasting or atrophy  Neurologic: A&O X 3;  No focal weakness or paresthesias are detected; speech is fluent/normal Psychiatric:  The pt has Normal affect.  CBC    Component Value Date/Time    WBC 8.3  06/09/2020 1315   RBC 5.59 06/09/2020 1315   HGB 16.6 06/09/2020 1315   HCT 52.2 (H) 06/09/2020 1315   PLT 177 06/09/2020 1315   MCV 93.4 06/09/2020 1315   MCH 29.7 06/09/2020 1315   MCHC 31.8 06/09/2020 1315   RDW 14.1 06/09/2020 1315   LYMPHSABS 1.9 06/09/2020 1315   MONOABS 0.7 06/09/2020 1315   EOSABS 0.4 06/09/2020 1315   BASOSABS 0.1 06/09/2020 1315    BMET    Component Value Date/Time   NA 138 06/09/2020 1315   K 4.4 06/09/2020 1315   CL 101 06/09/2020 1315   CO2 29 06/09/2020 1315   GLUCOSE 177 (H) 06/09/2020 1315   BUN 23 06/09/2020 1315   CREATININE 1.58 (H) 06/09/2020 1315   CALCIUM 9.6 06/09/2020 1315   GFRNONAA 43 (L) 06/09/2020 1315   GFRAA 34 (L) 12/25/2018 0627    COAGS: Lab Results  Component Value Date   INR 1.38 10/27/2016   INR 1.21 05/08/2015   INR 1.20 05/07/2015     Non-Invasive Vascular Imaging:   CTA NECK FINDINGS  Aortic arch: Standard aortic branching. Atherosclerotic plaque within the visualized aortic arch and proximal major branch vessels of the neck. No hemodynamically significant innominate or proximal subclavian artery stenosis.  Right carotid system: CCA and ICA patent within the neck. Prominent soft and calcified plaque within the carotid bifurcation and proximal ICA. Resultant stenosis of the proximal ICA of up to 70% (series 12, images 148 and 149). Distal to this, the ICA is patent within the neck without stenosis. Partially retropharyngeal course of the ICA  Left carotid system: CCA and ICA patent within the neck without significant stenosis (50% or greater). Mild calcified plaque within the carotid bifurcation and proximal ICA.  Vertebral arteries: Codominant and patent within the neck bilaterally. Streak and beam hardening artifact arising from the patient's shoulders limits evaluation. However, there is apparent moderate/severe stenosis at the origin of both vertebral arteries.  Skeleton: No acute  bony abnormality or aggressive osseous lesion. 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 As before, prominent ossification of the posterior longitudinal ligament contributes to suspected at least moderate spinal canal stenosis at C2-C3, C3-C4 and C4-C5.  Other neck: No neck mass or cervical lymphadenopathy.  Statin:  Yes.   Beta Blocker:  Yes.   Aspirin:  No. ACEI:  No. ARB:  No. CCB use:  No Other antiplatelets/anticoagulants:  Yes.   Apixaban   ASSESSMENT/PLAN: This is a 84 y.o. male with multiple medical comorbidities who presented to ED with painless monocular vision loss of his right eye. He continues to have loss of vision in his right eye. He has no other neurological deficits. 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 CTA of neck shows CCA and ICA patent within the neck with soft and calcified plaque within the carotid bifurcation and proximal ICA of up to 70%. On MRI he additionally showed probable subacute infarct into the right precentral gyrus. Pending results of echo. Patient is on statin and Eliquis. Need to rule out embolism with patients history of atrial fibrillation, however It is possible that his CRAO is due to embolization from the soft plaque in the right ICA. Based on imaging and symptoms patient will likely benefit from carotid intervention. The on call vascular surgeon Dr. Trula Slade will see and evaluate the patient later this afternoon to discuss further management   Karoline Caldwell PA-C [facility] [phone] 06/10/2020  3:35 PM  I agree with the above. I have seen and evaluated the patient. Briefly this is an 84 year old gentleman who presented with central retinal artery occlusion likely secondary to plaque within his right carotid artery. He has multiple comorbidities. I would prefer to perform a right TCAR if his anatomy is suitable. I discussed the details of the procedure including  the risk of stroke. Tentatively he is planned for surgery on Friday. If I proceed with TCAR, he will need to be loaded on Plavix and maintained on triple therapy for 1 month. I am stopping his Eliquis tonight. I will follow up with him tomorrow once I have confirmed that he is a TCAR candidate.  Annamarie Major

## 2020-06-10 NOTE — ED Notes (Signed)
carelink report given, carelink will be here 2-3 ,mins

## 2020-06-11 DIAGNOSIS — H5461 Unqualified visual loss, right eye, normal vision left eye: Secondary | ICD-10-CM | POA: Diagnosis not present

## 2020-06-11 DIAGNOSIS — I872 Venous insufficiency (chronic) (peripheral): Secondary | ICD-10-CM | POA: Diagnosis not present

## 2020-06-11 DIAGNOSIS — I25118 Atherosclerotic heart disease of native coronary artery with other forms of angina pectoris: Secondary | ICD-10-CM

## 2020-06-11 DIAGNOSIS — H3411 Central retinal artery occlusion, right eye: Secondary | ICD-10-CM | POA: Diagnosis not present

## 2020-06-11 DIAGNOSIS — I6521 Occlusion and stenosis of right carotid artery: Secondary | ICD-10-CM

## 2020-06-11 DIAGNOSIS — I4821 Permanent atrial fibrillation: Secondary | ICD-10-CM | POA: Diagnosis not present

## 2020-06-11 LAB — BASIC METABOLIC PANEL
Anion gap: 11 (ref 5–15)
BUN: 21 mg/dL (ref 8–23)
CO2: 26 mmol/L (ref 22–32)
Calcium: 9.8 mg/dL (ref 8.9–10.3)
Chloride: 102 mmol/L (ref 98–111)
Creatinine, Ser: 1.66 mg/dL — ABNORMAL HIGH (ref 0.61–1.24)
GFR, Estimated: 41 mL/min — ABNORMAL LOW (ref >60)
Glucose, Bld: 164 mg/dL — ABNORMAL HIGH (ref 70–99)
Potassium: 4 mmol/L (ref 3.5–5.1)
Sodium: 139 mmol/L (ref 135–145)

## 2020-06-11 LAB — LIPID PANEL
Cholesterol: 124 mg/dL (ref 0–200)
HDL: 26 mg/dL — ABNORMAL LOW (ref >40)
LDL Cholesterol: 51 mg/dL (ref 0–99)
Total CHOL/HDL Ratio: 4.8 RATIO
Triglycerides: 234 mg/dL — ABNORMAL HIGH (ref <150)
VLDL: 47 mg/dL — ABNORMAL HIGH (ref 0–40)

## 2020-06-11 LAB — GLUCOSE, CAPILLARY
Glucose-Capillary: 164 mg/dL — ABNORMAL HIGH (ref 70–99)
Glucose-Capillary: 232 mg/dL — ABNORMAL HIGH (ref 70–99)
Glucose-Capillary: 178 mg/dL — ABNORMAL HIGH (ref 70–99)
Glucose-Capillary: 142 mg/dL — ABNORMAL HIGH (ref 70–99)

## 2020-06-11 MED ORDER — CLOPIDOGREL BISULFATE 75 MG PO TABS
300.0000 mg | ORAL_TABLET | Freq: Once | ORAL | Status: AC
  Administered 2020-06-11: 300 mg via ORAL
  Filled 2020-06-11: qty 4

## 2020-06-11 MED ORDER — CEFAZOLIN SODIUM-DEXTROSE 2-4 GM/100ML-% IV SOLN
2.0000 g | INTRAVENOUS | Status: AC
  Filled 2020-06-11: qty 100

## 2020-06-11 MED ORDER — ASPIRIN EC 81 MG PO TBEC
81.0000 mg | DELAYED_RELEASE_TABLET | Freq: Every day | ORAL | Status: DC
  Administered 2020-06-11 – 2020-06-13 (×2): 81 mg via ORAL
  Filled 2020-06-11 (×2): qty 1

## 2020-06-11 MED ORDER — CLOPIDOGREL BISULFATE 75 MG PO TABS
75.0000 mg | ORAL_TABLET | Freq: Every day | ORAL | Status: DC
  Administered 2020-06-13: 75 mg via ORAL
  Filled 2020-06-11: qty 1

## 2020-06-11 NOTE — Progress Notes (Signed)
Physical Therapy Treatment Patient Details Name: Reginald [name] MRN: [id number] DOB: Jan 02, 1936 Today's Date: 06/11/2020    History of Present Illness Patient is an 84 year old LTC resident at [facility] admited 11/30 d/t painless vision loss in the right eye. MRI shows questionable recent stroke with neurology consulted, patient with possible acute retinal artery occlusion. PMH includes COPD, OSA, history of COVID-19 infection, a-fib on Eliquis, T2DM, hyperlipidemia, history of CAD/stent, history of chronic diastolic CHF, CKD IIIa    PT Comments    Pt tolerates treatment well with improved bed mobility and transfer quality. Pt is able to initiate gait training over short distances at this time and has good awareness of his activity tolerance currently. Pt reports having assistance for all ADLs and seems to report he has assistance for all ambulation at [facility]. Pt will continue to benefit from acute PT POC to improve transfer and ambulation quality in an effort to reduce falls risk. PT recommends a return to [facility] with continued PT services. If the pt does not have assistance available for transfers and ambulation then the pt will require SNF stay.   Follow Up Recommendations  Other (comment) (return to LTC at [facility] with continued therapies)     Equipment Recommendations  None recommended by PT    Recommendations for Other Services       Precautions / Restrictions Precautions Precautions: Fall Precaution Comments: right  eye vision loss Restrictions Weight Bearing Restrictions: No    Mobility  Bed Mobility Overal bed mobility: Needs Assistance Bed Mobility: Supine to Sit     Supine to sit: Min assist;+2 for physical assistance        Transfers Overall transfer level: Needs assistance Equipment used: Rolling walker (2 wheeled) Transfers: Sit to/from Stand Sit to Stand: Min assist;+2 physical assistance            Ambulation/Gait Ambulation/Gait assistance:  Min guard Gait Distance (Feet): 20 Feet (additional trial of 15') Assistive device: Rolling walker (2 wheeled) Gait Pattern/deviations: Step-to pattern Gait velocity: reduced Gait velocity interpretation: <1.8 ft/sec, indicate of risk for recurrent falls General Gait Details: short step-to gait with increased trunk flexion, increased R knee flexion during stance phase   Stairs             Wheelchair Mobility    Modified Rankin (Stroke Patients Only)       Balance Overall balance assessment: Needs assistance Sitting-balance support: Feet supported;No upper extremity supported Sitting balance-Leahy Scale: Fair     Standing balance support: Bilateral upper extremity supported Standing balance-Leahy Scale: Poor Standing balance comment: minG with BUE support of RW or sink                            Cognition Arousal/Alertness: Awake/alert Behavior During Therapy: WFL for tasks assessed/performed Overall Cognitive Status: Impaired/Different from baseline Area of Impairment: Problem solving                             Problem Solving: Slow processing        Exercises      General Comments General comments (skin integrity, edema, etc.): VSS on RA      Pertinent Vitals/Pain Pain Assessment: No/denies pain    Home Living                      Prior Function  PT Goals (current goals can now be found in the care plan section) Acute Rehab PT Goals Patient Stated Goal: to improve gait quality Progress towards PT goals: Progressing toward goals    Frequency    Min 2X/week      PT Plan Current plan remains appropriate    Co-evaluation PT/OT/SLP Co-Evaluation/Treatment: Yes Reason for Co-Treatment: For patient/therapist safety;To address functional/ADL transfers          AM-PAC PT "6 Clicks" Mobility   Outcome Measure  Help needed turning from your back to your side while in a flat bed without using  bedrails?: A Little Help needed moving from lying on your back to sitting on the side of a flat bed without using bedrails?: A Little Help needed moving to and from a bed to a chair (including a wheelchair)?: A Little Help needed standing up from a chair using your arms (e.g., wheelchair or bedside chair)?: A Little Help needed to walk in hospital room?: A Little Help needed climbing 3-5 steps with a railing? : Total 6 Click Score: 16    End of Session Equipment Utilized During Treatment: Gait belt Activity Tolerance: Patient tolerated treatment well Patient left: in chair;with call bell/phone within reach;with chair alarm set Nurse Communication: Mobility status PT Visit Diagnosis: Unsteadiness on feet (R26.81);Difficulty in walking, not elsewhere classified (R26.2)     Time: 1510-1536 PT Time Calculation (min) (ACUTE ONLY): 26 min  Charges:  $Gait Training: 8-22 mins                     Zenaida Niece, PT, DPT Acute Rehabilitation Pager: [phone]    Zenaida Niece 06/11/2020, 3:55 PM

## 2020-06-11 NOTE — H&P (View-Only) (Signed)
    Subjective  -   No acute overnight events.   Physical Exam:  Neurologically intact except for right eye vision loss Cardiac: Regular rhythm Abdomen: Soft and nontender Respiratory: Nonlabored breathing    Assessment/Plan:    Symptomatic right carotid stenosis.  I feel that the patient is a good candidate for a right TCAR.  I discussed the details of the procedure as well as the risks and benefits including bleeding and stroke.  He wishes to proceed.  I stop his anticoagulation yesterday.  He will need to be loaded on Plavix today as well as started on aspirin.  He will continue his statin.  Wells Niraj Kudrna 06/11/2020 2:51 PM --  Vitals:   06/11/20 0757 06/11/20 1142  BP: (!) 156/75 (!) 144/62  Pulse: 91 69  Resp: 20 18  Temp: 98.3 F (36.8 C) 98.2 F (36.8 C)  SpO2: 95% 97%   No intake or output data in the 24 hours ending 06/11/20 1451   Laboratory CBC    Component Value Date/Time   WBC 8.3 06/09/2020 1315   HGB 16.6 06/09/2020 1315   HCT 52.2 (H) 06/09/2020 1315   PLT 177 06/09/2020 1315    BMET    Component Value Date/Time   NA 139 06/11/2020 0506   K 4.0 06/11/2020 0506   CL 102 06/11/2020 0506   CO2 26 06/11/2020 0506   GLUCOSE 164 (H) 06/11/2020 0506   BUN 21 06/11/2020 0506   CREATININE 1.66 (H) 06/11/2020 0506   CALCIUM 9.8 06/11/2020 0506   GFRNONAA 41 (L) 06/11/2020 0506   GFRAA 34 (L) 12/25/2018 0627    COAG Lab Results  Component Value Date   INR 1.38 10/27/2016   INR 1.21 05/08/2015   INR 1.20 05/07/2015   No results found for: PTT  Antibiotics Anti-infectives (From admission, onward)   Start     Dose/Rate Route Frequency Ordered Stop   06/12/20 0600  ceFAZolin (ANCEF) IVPB 2g/100 mL premix       Note to Pharmacy: Send with pt to OR   2 g 200 mL/hr over 30 Minutes Intravenous To Short Stay 06/11/20 0802 06/13/20 0600       V. [name], M.D., [facility] [place] Office:  [phone] Pager:  [phone]

## 2020-06-11 NOTE — Progress Notes (Signed)
PROGRESS NOTE    Reginald [name]  UXN:[id number] DOB: 1935/10/31 DOA: 06/09/2020 PCP: Patient, No Pcp Per    No chief complaint on file.   Brief Narrative:  84 year old gentleman prior history of COPD, coronary artery disease, hypertension, hyperlipidemia, obstructive sleep apnea, diabetes, atrial fibrillation on anticoagulation, presents to ED for evaluation of painless vision loss in the right eye since Sunday afternoon.  Patient denies any other neurological deficits at this time.  MRI of the brain and orbit show a probable subacute infarct in the right precentral gyrus versus infarct.  Neurology recommended vascular surgery consultation for revascularization of the right carotid.  Vascular surgery recommended a right TCAR scheduled for tomorrow.  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 Vascular surgery consulted and plan for right TCAR tomorrow.  He was loaded on Plavix and currently on 75 mg of Plavix daily. Echocardiogram shows Left ventricular ejection fraction, by estimation, is 55 to 60%. The  left ventricle has normal function. The left ventricle has no regional  wall motion abnormalities. There is moderate left ventricular hypertrophy.  Left ventricular diastolic  parameters are indeterminate.   History of coronary artery disease Patient currently denies any chest pain, continue with aspirin, Plavix.  Obstructive sleep  apnea continue with CPAP at this time.   Atrial fibrillation Rate controlled   Stage IV CKD Creatinine at baseline excuse   Chronic diastolic heart failure Patient appears to be compensated at this time.  He denies any shortness of breath or worsening leg edema this time. Continue with metoprolol 25 mg daily   Type 2 diabetes mellitus CBG (last 3)  Recent Labs    06/10/20 2114 06/11/20 0607 06/11/20 1246  GLUCAP 193* 164* 232*   Continue with sliding scale insulin.   Diabetic neuropathy Continue with gabapentin 200 mg daily at bedtime.   Constipation Continue with Senokot and MiraLAX.  Chronic venous insufficiency leading to dermatitis of lower extremities.  Body mass index is 43.31 kg/m. Morbid obesity Recommend outpatient follow-up with PCP for weight loss.  Hyperlipidemia Continue with Lipitor.    COPD No wheezing heard on exam today   History of COVID-19 infection Patient currently denies any chest pain or shortness of breath at this time.  DVT prophylaxis: (SCDs Code Status: Full code Family Communication: (None at bedside Disposition:   Status is: Inpatient  Remains inpatient appropriate because:Ongoing diagnostic testing needed not appropriate for outpatient work up, Unsafe d/c plan and IV treatments appropriate due to intensity of illness or inability to take PO   Dispo: The patient is from: SNF              Anticipated d/c is to: SNF              Anticipated d/c date is: 2 days              Patient currently is not medically stable to d/c.  Consultants:   Vascular surgery consult  Neurology  Procedures: None Antimicrobials: None  Subjective: No new complaints at this time.  Patient reports he could see some light in the outer visual field of the right side  Objective: Vitals:   06/11/20 0359 06/11/20 0730 06/11/20 0757 06/11/20 1142  BP: 118/74  (!) 156/75 (!) 144/62  Pulse: (!) 54  91 69  Resp: 20 17 20 18     Temp: 98.2 F (36.8 C)  98.3 F (36.8 C) 98.2 F (36.8 C)  TempSrc: Oral  Oral Oral  SpO2: 92%  95% 97%  Weight:      Height:       No intake or output data in the 24 hours ending 06/11/20 1614 Filed Weights   06/09/20 1240  Weight: (!) 140.8 kg    Examination:  General exam: Appears calm and comfortable  Respiratory system: Clear to auscultation. Respiratory effort normal. Cardiovascular system: S1 & S2 heard, RRR. No JVD,  No pedal edema. Gastrointestinal system: Abdomen is nondistended, soft and nontender. Normal bowel sounds heard. Central nervous system: Alert and oriented. No focal neurological deficits except for right eye vision loss Extremities: Symmetric 5 x 5 power. Skin: No rashes, lesions or ulcers Psychiatry:  Mood & affect appropriate.     Data Reviewed: I have personally reviewed following labs and imaging studies  CBC: Recent Labs  Lab 06/09/20 1315  WBC 8.3  NEUTROABS 5.1  HGB 16.6  HCT 52.2*  MCV 93.4  PLT 759    Basic Metabolic Panel: Recent Labs  Lab 06/09/20 1315 06/11/20 0506  NA 138 139  K 4.4 4.0  CL 101 102  CO2 29 26  GLUCOSE 177* 164*  BUN 23 21  CREATININE 1.58* 1.66*  CALCIUM 9.6 9.8    GFR: Estimated Creatinine Clearance: 49.3 mL/min (A) (by C-G formula based on SCr of 1.66 mg/dL (H)).  Liver Function Tests: Recent Labs  Lab 06/09/20 1315  AST 28  ALT 32  ALKPHOS 60  BILITOT 1.0  PROT 6.6  ALBUMIN 3.8    CBG: Recent Labs  Lab 06/10/20 1208 06/10/20 1630 06/10/20 2114 06/11/20 0607 06/11/20 1246  GLUCAP 180* 191* 193* 164* 232*     Recent Results (from the past 240 hour(s))  Resp Panel by RT-PCR (Flu A&B, Covid) Nasopharyngeal Swab     Status: None   Collection Time: 06/09/20  4:58 PM   Specimen: Nasopharyngeal Swab; Nasopharyngeal(NP) swabs in vial transport medium  Result Value Ref Range Status   SARS Coronavirus 2 by RT PCR NEGATIVE NEGATIVE Final    Comment: (NOTE) SARS-CoV-2 target nucleic  acids are NOT DETECTED.  The SARS-CoV-2 RNA is generally detectable in upper respiratory specimens during the acute phase of infection. The lowest concentration of SARS-CoV-2 viral copies this assay can detect is 138 copies/mL. A negative result does not preclude SARS-Cov-2 infection and should not be used as the sole basis for treatment or other patient management decisions. A negative result may occur with  improper specimen collection/handling, submission of specimen other than nasopharyngeal swab, presence of viral mutation(s) within the areas targeted by this assay, and inadequate number of viral copies(<138 copies/mL). A negative result must be combined with clinical observations, patient history, and epidemiological information. The expected result is Negative.  Fact Sheet for Patients:  EntrepreneurPulse.com.au  Fact Sheet for Healthcare Providers:  IncredibleEmployment.be  This test is no t yet approved or cleared by the [place] FDA and  has been authorized for detection and/or  diagnosis of SARS-CoV-2 by FDA under an Emergency Use Authorization (EUA). This EUA will remain  in effect (meaning this test can be used) for the duration of the COVID-19 declaration under Section 564(b)(1) of the Act, 21 U.S.C.section 360bbb-3(b)(1), unless the authorization is terminated  or revoked sooner.       Influenza A by PCR NEGATIVE NEGATIVE Final   Influenza B by PCR NEGATIVE NEGATIVE Final    Comment: (NOTE) The Xpert Xpress SARS-CoV-2/FLU/RSV plus assay is intended as an aid in the diagnosis of influenza from Nasopharyngeal swab specimens and should not be used as a sole basis for treatment. Nasal washings and aspirates are unacceptable for Xpert Xpress SARS-CoV-2/FLU/RSV testing.  Fact Sheet for Patients: EntrepreneurPulse.com.au  Fact Sheet for Healthcare Providers: IncredibleEmployment.be  This  test is not yet approved or cleared by the [place] FDA and has been authorized for detection and/or diagnosis of SARS-CoV-2 by FDA under an Emergency Use Authorization (EUA). This EUA will remain in effect (meaning this test can be used) for the duration of the COVID-19 declaration under Section 564(b)(1) of the Act, 21 U.S.C. section 360bbb-3(b)(1), unless the authorization is terminated or revoked.  Performed at [facility], [place] [address], [place], [place] [address]          Radiology Studies: ECHOCARDIOGRAM COMPLETE  Result Date: 06/10/2020    ECHOCARDIOGRAM REPORT   Patient Name:   Reginald [name] [facility] Date of Exam: 06/10/2020 Medical Rec #:  [id number]        Height:       71.0 in Accession #:    [id number]       Weight:       310.5 lb Date of Birth:  [birth year]        BSA:          2.542 m Patient Age:    65 years         BP:           171/99 mmHg Patient Gender: M                HR:           53 bpm. Exam Location:  Inpatient Procedure: 2D Echo, 3D Echo, Color Doppler and Cardiac Doppler Indications:    TIA 435.9 / G45.9  History:        Patient has prior history of Echocardiogram examinations, most                 recent 12/20/2018. CHF, CAD and Previous Myocardial Infarction,                 Arrythmias:Atrial Fibrillation; Risk Factors:Non-Smoker,                 Hypertension, Diabetes, Dyslipidemia and Sleep Apnea. Chronic                 kidney disease.  Sonographer:    Darlina Sicilian RDCS Referring Phys: [id number] [facility]  Sonographer Comments: Suboptimal apical window. IMPRESSIONS  1. Left ventricular ejection fraction, by estimation, is 55 to 60%. The left ventricle has normal function. The left ventricle has no regional wall motion abnormalities. There is moderate left ventricular hypertrophy. Left ventricular diastolic parameters are indeterminate.  2. Right ventricular systolic function is normal. The right ventricular size is normal.  3. The mitral valve is  myxomatous. No evidence of mitral valve regurgitation.  4. The aortic valve is calcified. There is mild thickening of the aortic valve.  Aortic valve regurgitation is not visualized.  5. There is mild dilatation of the ascending aorta, measuring 38 mm. Comparison(s): A prior study was performed on 12/20/2018. No significant change from prior study. Difficult comparison of ascending aortic measurement, presently 38 mm. FINDINGS  Left Ventricle: Left ventricular ejection fraction, by estimation, is 55 to 60%. The left ventricle has normal function. The left ventricle has no regional wall motion abnormalities. Definity contrast agent was given IV to delineate the left ventricular  endocardial borders. The left ventricular internal cavity size was normal in size. There is moderate left ventricular hypertrophy. Left ventricular diastolic parameters are indeterminate. Right Ventricle: The right ventricular size is normal. Right vetricular wall thickness was not well visualized. Right ventricular systolic function is normal. Left Atrium: Left atrial size was normal in size. Right Atrium: Right atrial size was normal in size. Pericardium: There is no evidence of pericardial effusion. Mitral Valve: The mitral valve is myxomatous. There is mild thickening of the mitral valve leaflet(s). Mild mitral annular calcification. No evidence of mitral valve regurgitation. Tricuspid Valve: The tricuspid valve is grossly normal. Tricuspid valve regurgitation is not demonstrated. Aortic Valve: The aortic valve is calcified. There is mild thickening of the aortic valve. Aortic valve regurgitation is not visualized. Pulmonic Valve: The pulmonic valve was not well visualized. Pulmonic valve regurgitation is not visualized. Aorta: Indexed 2.1; low risk assessment. The aortic root is normal in size and structure. There is mild dilatation of the ascending aorta, measuring 38 mm. IAS/Shunts: The interatrial septum was not well visualized.  LEFT  VENTRICLE PLAX 2D LVIDd:         4.70 cm LVIDs:         3.90 cm LV PW:         0.90 cm LV IVS:        1.80 cm  3D Volume EF LVOT diam:     2.10 cm  LV 3D EF:    55.60 % LV SV:         40       LV 3D EDV:   121500.00 mm LV SV Index:   16       LV 3D ESV:   53900.00 mm LVOT Area:     3.46 cm LV 3D SV:    67600.00 mm  LEFT ATRIUM             Index LA diam:        3.90 cm 1.53 cm/m LA Vol (A2C):   43.3 ml 17.03 ml/m LA Vol (A4C):   46.7 ml 18.37 ml/m LA Biplane Vol: 47.5 ml 18.69 ml/m  AORTIC VALVE LVOT Vmax:   66.70 cm/s LVOT Vmean:  40.900 cm/s LVOT VTI:    0.115 m  AORTA Ao Root diam: 3.10 cm Ao Asc diam:  3.80 cm MITRAL VALVE MV Area (PHT): 3.65 cm    SHUNTS MV Decel Time: 208 msec    Systemic VTI:  0.12 m MV E velocity: 87.33 cm/s  Systemic Diam: 2.10 cm Rudean Haskell MD Electronically signed by Rudean Haskell MD Signature Date/Time: 06/10/2020/3:42:07 PM    Final         Scheduled Meds: . aspirin EC  81 mg Oral Daily  . atorvastatin  40 mg Oral QHS  . cholecalciferol  2,000 Units Oral Daily  . [START ON 06/12/2020] clopidogrel  75 mg Oral Daily  . docusate sodium  100 mg Oral QHS  . gabapentin  200 mg Oral QHS  . insulin  aspart  0-9 Units Subcutaneous TID WC  . latanoprost  1 drop Both Eyes QHS  . loratadine  10 mg Oral Daily  . magnesium oxide  400 mg Oral Daily  . melatonin  5 mg Oral QHS  . metoprolol succinate  25 mg Oral Daily  . polyethylene glycol  17 g Oral Daily  . polyvinyl alcohol  1 drop Both Eyes QID  . tamsulosin  0.4 mg Oral QPC supper  . vitamin B-12  1,000 mcg Oral Daily   Continuous Infusions: . [START ON 06/12/2020]  ceFAZolin (ANCEF) IV       LOS: 1 day        Hosie Poisson, MD [facility]   To contact the attending provider between 7A-7P or the covering provider during after hours 7P-7A, please log into the web site [email or web address] and access using universal [place] password for that web site. If you do not have the password,  please call the hospital operator.  06/11/2020, 4:14 PM

## 2020-06-11 NOTE — Progress Notes (Signed)
Pre op orders placed for npo/labs/abx.  Dr. Trula Slade to determine procedure and will place order for consent later today for surgery tomorrow.   Leontine Locket, [facility] 06/11/2020 8:03 AM

## 2020-06-11 NOTE — Progress Notes (Signed)
    Subjective  -   No acute overnight events.   Physical Exam:  Neurologically intact except for right eye vision loss Cardiac: Regular rhythm Abdomen: Soft and nontender Respiratory: Nonlabored breathing    Assessment/Plan:    Symptomatic right carotid stenosis.  I feel that the patient is a good candidate for a right TCAR.  I discussed the details of the procedure as well as the risks and benefits including bleeding and stroke.  He wishes to proceed.  I stop his anticoagulation yesterday.  He will need to be loaded on Plavix today as well as started on aspirin.  He will continue his statin.  Reginald [name] 06/11/2020 2:51 PM --  Vitals:   06/11/20 0757 06/11/20 1142  BP: (!) 156/75 (!) 144/62  Pulse: 91 69  Resp: 20 18  Temp: 98.3 F (36.8 C) 98.2 F (36.8 C)  SpO2: 95% 97%   No intake or output data in the 24 hours ending 06/11/20 1451   Laboratory CBC    Component Value Date/Time   WBC 8.3 06/09/2020 1315   HGB 16.6 06/09/2020 1315   HCT 52.2 (H) 06/09/2020 1315   PLT 177 06/09/2020 1315    BMET    Component Value Date/Time   NA 139 06/11/2020 0506   K 4.0 06/11/2020 0506   CL 102 06/11/2020 0506   CO2 26 06/11/2020 0506   GLUCOSE 164 (H) 06/11/2020 0506   BUN 21 06/11/2020 0506   CREATININE 1.66 (H) 06/11/2020 0506   CALCIUM 9.8 06/11/2020 0506   GFRNONAA 41 (L) 06/11/2020 0506   GFRAA 34 (L) 12/25/2018 0627    COAG Lab Results  Component Value Date   INR 1.38 10/27/2016   INR 1.21 05/08/2015   INR 1.20 05/07/2015   No results found for: PTT  Antibiotics Anti-infectives (From admission, onward)   Start     Dose/Rate Route Frequency Ordered Stop   06/12/20 0600  ceFAZolin (ANCEF) IVPB 2g/100 mL premix       Note to Pharmacy: Send with pt to OR   2 g 200 mL/hr over 30 Minutes Intravenous To Short Stay 06/11/20 0802 06/13/20 0600       V. [name], M.D., [facility] [place] Office:  [phone] Pager:  [phone]

## 2020-06-11 NOTE — Progress Notes (Signed)
Occupational Therapy Treatment Patient Details Name: Reginald [name] MRN: [id number] DOB: [birth year] Today's Date: 06/11/2020    History of present illness Patient is an 84 year old LTC [facility] admited 11/30 d/t painless vision loss in the right eye. MRI shows questionable recent stroke with neurology consulted, patient with possible acute retinal artery occlusion. PMH includes COPD, OSA, history of COVID-19 infection, a-fib on Eliquis, T2DM, hyperlipidemia, history of CAD/stent, history of chronic diastolic CHF, CKD IIIa   OT comments  Pt progressing towards established OT goals. Continues to report no vision at R eye and noting undershooting (minimal) during reaching tasks. Pt requiring Min A +2 for functional mobility with RW. Pt with decreased cognition, balance, strength, and activity tolerance; requiring several seated rest breaks. Continue to recommend dc to SNF and will continue to follow acutely as admitted.    Follow Up Recommendations  SNF    Equipment Recommendations  None recommended by OT    Recommendations for Other Services      Precautions / Restrictions Precautions Precautions: Fall Precaution Comments: right  eye vision loss Restrictions Weight Bearing Restrictions: No       Mobility Bed Mobility Overal bed mobility: Needs Assistance Bed Mobility: Supine to Sit     Supine to sit: Min assist;+2 for physical assistance     General bed mobility comments: Min A to support trunk and pull into sitting  Transfers Overall transfer level: Needs assistance Equipment used: Rolling walker (2 wheeled) Transfers: Sit to/from Stand Sit to Stand: Min assist;+2 physical assistance         General transfer comment: Min A for power up into standing    Balance Overall balance assessment: Needs assistance Sitting-balance support: Feet supported;No upper extremity supported Sitting balance-Leahy Scale: Fair     Standing balance support: Bilateral upper  extremity supported Standing balance-Leahy Scale: Poor Standing balance comment: minG with BUE support of RW or sink                           ADL either performed or assessed with clinical judgement   ADL Overall ADL's : Needs assistance/impaired                     Lower Body Dressing: Maximal assistance Lower Body Dressing Details (indicate cue type and reason): don socks; near baseline for this Toilet Transfer: Minimal assistance;+2 for safety/equipment;Ambulation;RW (Simulated to recliner) Toilet Transfer Details (indicate cue type and reason): Min A for power up into standing Toileting- Clothing Manipulation and Hygiene: Total assistance;Sitting/lateral lean;Sit to/from stand Toileting - Clothing Manipulation Details (indicate cue type and reason): Pt maintaining balance at sink. Total A for peri care; which is baseline     Functional mobility during ADLs: Minimal assistance;+2 for safety/equipment;Rolling walker General ADL Comments: Pt presenting with decreased balance, strength, vision, and acitvity tolerance     Vision   Vision Assessment?: Yes Eye Alignment: Within Functional Limits Ocular Range of Motion: Within Functional Limits Alignment/Gaze Preference: Within Defined Limits Tracking/Visual Pursuits: Able to track stimulus in all quads without difficulty Additional Comments: Continues to report no vision in R eye; "it is all black". Pt reporting "my judgement is off" in reference to depth perception. In general, pt dmeonstrating WFL depth perception with occasions of undershooting.    Perception     Praxis      Cognition Arousal/Alertness: Awake/alert Behavior During Therapy: WFL for tasks assessed/performed Overall Cognitive Status: Impaired/Different from baseline Area  of Impairment: Problem solving                             Problem Solving: Slow processing General Comments: Requiring increased time throughout for processing  and problem solving.         Exercises     Shoulder Instructions       General Comments VSS on RA    Pertinent Vitals/ Pain       Pain Assessment: No/denies pain  Home Living                                          Prior Functioning/Environment              Frequency  Min 2X/week        Progress Toward Goals  OT Goals(current goals can now be found in the care plan section)  Progress towards OT goals: Progressing toward goals  Acute Rehab OT Goals Patient Stated Goal: to improve gait quality OT Goal Formulation: With patient Time For Goal Achievement: 06/24/20 Potential to Achieve Goals: Good ADL Goals Pt Will Perform Upper Body Dressing: with set-up;sitting Pt Will Transfer to Toilet: with supervision;stand pivot transfer Additional ADL Goal #1: Patient will safely navigate environment with less than 25% verbal cues at supervision level in order to minimize risk of falls during functional mobility.  Plan Discharge plan remains appropriate    Co-evaluation    PT/OT/SLP Co-Evaluation/Treatment: Yes Reason for Co-Treatment: For patient/therapist safety;To address functional/ADL transfers   OT goals addressed during session: ADL's and self-care      AM-PAC OT "6 Clicks" Daily Activity     Outcome Measure   Help from another person eating meals?: A Little Help from another person taking care of personal grooming?: A Little Help from another person toileting, which includes using toliet, bedpan, or urinal?: A Lot Help from another person bathing (including washing, rinsing, drying)?: A Lot Help from another person to put on and taking off regular upper body clothing?: A Little Help from another person to put on and taking off regular lower body clothing?: A Lot 6 Click Score: 15    End of Session Equipment Utilized During Treatment: Rolling walker  OT Visit Diagnosis: Other symptoms and signs involving the nervous system (R29.898)    Activity Tolerance Patient tolerated treatment well   Patient Left with call bell/phone within reach;in chair;with chair alarm set   Nurse Communication Mobility status        Time: 1660-6301 OT Time Calculation (min): 28 min  Charges: OT General Charges $OT Visit: 1 Visit OT Treatments $Self Care/Home Management : 8-22 mins  [place], OTR/L Acute Rehab Pager: [phone] Office: [place] 06/11/2020, 4:59 PM

## 2020-06-11 NOTE — Progress Notes (Signed)
STROKE TEAM PROGRESS NOTE   INTERVAL HISTORY Patient lying in bed, no family at the bedside.  He still has right eye vision loss, able to have slight light perception around outside of the visual field on the right.  Plan for TCAR Friday with Dr. Trula Slade.  Vitals:   06/11/20 0359 06/11/20 0730 06/11/20 0757 06/11/20 1142  BP: 118/74  (!) 156/75 (!) 144/62  Pulse: (!) 54  91 69  Resp: 20 17 20 18   Temp: 98.2 F (36.8 C)  98.3 F (36.8 C) 98.2 F (36.8 C)  TempSrc: Oral  Oral Oral  SpO2: 92%  95% 97%  Weight:      Height:       CBC:  Recent Labs  Lab 06/09/20 1315  WBC 8.3  NEUTROABS 5.1  HGB 16.6  HCT 52.2*  MCV 93.4  PLT 829   Basic Metabolic Panel:  Recent Labs  Lab 06/09/20 1315 06/11/20 0506  NA 138 139  K 4.4 4.0  CL 101 102  CO2 29 26  GLUCOSE 177* 164*  BUN 23 21  CREATININE 1.58* 1.66*  CALCIUM 9.6 9.8   Lipid Panel:  Recent Labs  Lab 06/11/20 0506  CHOL 124  TRIG 234*  HDL 26*  CHOLHDL 4.8  VLDL 47*  LDLCALC 51   Lab Results  Component Value Date   HGBA1C 8.1 (H) 05/28/2020    Urine Drug Screen: No results for input(s): LABOPIA, COCAINSCRNUR, LABBENZ, AMPHETMU, THCU, LABBARB in the last 168 hours.  Alcohol Level No results for input(s): ETH in the last 168 hours.  IMAGING past 24 hours No results found.  PHYSICAL EXAM  Temp:  [97.2 F (36.2 C)-98.3 F (36.8 C)] 97.6 F (36.4 C) (12/02 1654) Pulse Rate:  [54-91] 67 (12/02 1654) Resp:  [17-20] 18 (12/02 1654) BP: (118-156)/(61-75) 124/61 (12/02 1654) SpO2:  [92 %-97 %] 94 % (12/02 1654)  General - Well nourished, well developed, in no apparent distress.  Ophthalmologic - fundi not visualized due to noncooperation.  Cardiovascular - irregularly irregular heart rate and rhythm.  Mental Status -  Level of arousal and orientation to time, place, and person were intact. Language including expression, naming, repetition, comprehension was assessed and found intact. Fund of Knowledge  was assessed and was intact.  Cranial Nerves II - XII - II - Visual field intact OS.  Blind OD except slight light perception at outer edge. III, IV, VI - Extraocular movements intact. V - Facial sensation intact bilaterally. VII - Facial movement intact bilaterally. VIII - Hearing & vestibular intact bilaterally. X - Palate elevates symmetrically. XI - Chin turning & shoulder shrug intact bilaterally. XII - Tongue protrusion intact.  Motor Strength - The patient's strength was normal in all extremities and pronator drift was absent.  Bulk was normal and fasciculations were absent.   Motor Tone - Muscle tone was assessed at the neck and appendages and was normal.  Reflexes - The patient's reflexes were symmetrical in all extremities and he had no pathological reflexes.  Sensory - Light touch, temperature/pinprick were assessed and were symmetrical.    Coordination - The patient had normal movements in the hands with no ataxia or dysmetria.  Tremor was absent.  Gait and Station - deferred.   ASSESSMENT/PLAN [name] is a 84 y.o. male with history of COPD, coronary artery disease, hypertension, hyperlipidemia, obstructive sleep apnea, diabetes, congestive heart failure, CKD, atrial fibrillation on apixaban, presented with sudden onset of painless vision loss in the  right eye Sunday afternoon.   R CRAO in setting of symptomatic R ICA stenosis and known AF on Eliquis  MRI brain and orbits Probably punctate subacute R precentral gyrus infarct. 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 Moderate small vessel disease. Possible high L frontoparietal scalp lesion.  CT head no acute abnormality. 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 Small vessel disease. Atrophy. R posterior falx possible meningioma. 2.1cm L parietal scalp lesion.  CTA head no LVO. Intracranial atherosclerosis w/ multifocal stenoses:  Moderate paraclinoid L ICA, moderate proximal L M1, high-grade R A4, high-grade R P2, moderate / severe L P2/3  jxn    CTA neck R ICA 70% soft and calcified plaque. Moderate to severe B VA origin stenoses.  2D Echo EF 55-60%. MV myxomatous.   LDL 51  HgbA1c 8.1  VTE prophylaxis - SCDs    Eliquis (apixaban) daily prior to admission, now on aspirin 81 mg daily and clopidogrel 75 mg daily following plavix load. (Eliquis stopped for TCAR)   Therapy recommendations:  SNF  Disposition:  Return SNF (from nsg home, w/c bound past 2 yrs)  Chronic Atrial Fibrillation  Home anticoagulation:  Eliquis (apixaban) daily  . Off AC for possible TCAR . Continue Eliquis (apixaban) daily at discharge     Carotid Stenosis  CTA neck R ICA 70% soft and calcified plaque. Moderate to severe B VA origin stenoses.  VVS Dr. Trula Slade on board  Plan for TCAR tomorrow   On DAPT after Plavix load  Continue DAPT on discharge  Hypertension  Stable . Avoid low BP . Long-term BP goal normotensive  Hyperlipidemia  Home meds:  Lipitor 40, resumed in hospital  LDL 51, goal < 70  Continue statin at discharge  Diabetes type II Uncontrolled  HgbA1c 8.1, goal < 7.0  CBGs  SSI  Mild hyperglycemia  Close PCP follow-up for better DM control  Other Stroke Risk Factors  Advanced Age >/= 48   Morbid Obesity, Body mass index is 43.31 kg/m., BMI >/= 30 associated with increased stroke risk, recommend weight loss, diet and exercise as appropriate   Coronary artery disease  Obstructive sleep apnea, on CPAP at home  Congestive heart failure  COVID-19 in 11/2018  PVD  Other Active Problems  COPD w/o exacerbation  CKD IIIa  Chronic venous insufficiency  Hospital day # 1  Neurology will sign off. Please call with questions. Pt will follow up with stroke clinic NP at [facility] in about 4 weeks. Thanks for the consult.  Rosalin Hawking, MD PhD Stroke Neurology 06/11/2020 5:55 PM  To contact Stroke Continuity provider, please refer to [email or web address] After hours, contact General Neurology

## 2020-06-12 ENCOUNTER — Encounter (HOSPITAL_COMMUNITY)
Admission: EM | Disposition: A | Discharge status: Skilled Nursing Facility | Payer: Self-pay | Source: Home / Self Care | Attending: Internal Medicine

## 2020-06-12 ENCOUNTER — Inpatient Hospital Stay (HOSPITAL_COMMUNITY): Payer: Medicare Other | Admitting: Certified Registered Nurse Anesthetist

## 2020-06-12 ENCOUNTER — Inpatient Hospital Stay (HOSPITAL_COMMUNITY): Payer: Medicare Other

## 2020-06-12 DIAGNOSIS — G4733 Obstructive sleep apnea (adult) (pediatric): Secondary | ICD-10-CM | POA: Diagnosis not present

## 2020-06-12 DIAGNOSIS — I4821 Permanent atrial fibrillation: Secondary | ICD-10-CM | POA: Diagnosis not present

## 2020-06-12 DIAGNOSIS — I6521 Occlusion and stenosis of right carotid artery: Secondary | ICD-10-CM | POA: Diagnosis not present

## 2020-06-12 DIAGNOSIS — E1122 Type 2 diabetes mellitus with diabetic chronic kidney disease: Secondary | ICD-10-CM | POA: Diagnosis not present

## 2020-06-12 DIAGNOSIS — N1832 Chronic kidney disease, stage 3b: Secondary | ICD-10-CM

## 2020-06-12 HISTORY — PX: TRANSCAROTID ARTERY REVASCULARIZATIONÂ: SHX6778

## 2020-06-12 LAB — CBC
HCT: 48.6 % (ref 39.0–52.0)
Hemoglobin: 16.4 g/dL (ref 13.0–17.0)
MCH: 30.2 pg (ref 26.0–34.0)
MCHC: 33.7 g/dL (ref 30.0–36.0)
MCV: 89.5 fL (ref 80.0–100.0)
Platelets: 168 10*3/uL (ref 150–400)
RBC: 5.43 MIL/uL (ref 4.22–5.81)
RDW: 13.7 % (ref 11.5–15.5)
WBC: 9.2 10*3/uL (ref 4.0–10.5)
nRBC: 0 % (ref 0.0–0.2)

## 2020-06-12 LAB — BASIC METABOLIC PANEL
Anion gap: 11 (ref 5–15)
BUN: 26 mg/dL — ABNORMAL HIGH (ref 8–23)
CO2: 26 mmol/L (ref 22–32)
Calcium: 10.3 mg/dL (ref 8.9–10.3)
Chloride: 102 mmol/L (ref 98–111)
Creatinine, Ser: 1.84 mg/dL — ABNORMAL HIGH (ref 0.61–1.24)
GFR, Estimated: 36 mL/min — ABNORMAL LOW (ref >60)
Glucose, Bld: 159 mg/dL — ABNORMAL HIGH (ref 70–99)
Potassium: 4.1 mmol/L (ref 3.5–5.1)
Sodium: 139 mmol/L (ref 135–145)

## 2020-06-12 LAB — GLUCOSE, CAPILLARY
Glucose-Capillary: 133 mg/dL — ABNORMAL HIGH (ref 70–99)
Glucose-Capillary: 209 mg/dL — ABNORMAL HIGH (ref 70–99)
Glucose-Capillary: 269 mg/dL — ABNORMAL HIGH (ref 70–99)
Glucose-Capillary: 307 mg/dL — ABNORMAL HIGH (ref 70–99)
Glucose-Capillary: 315 mg/dL — ABNORMAL HIGH (ref 70–99)
Glucose-Capillary: 320 mg/dL — ABNORMAL HIGH (ref 70–99)

## 2020-06-12 LAB — TYPE AND SCREEN
ABO/RH(D): O POS
Antibody Screen: NEGATIVE

## 2020-06-12 LAB — POCT ACTIVATED CLOTTING TIME: Activated Clotting Time: 356 seconds

## 2020-06-12 SURGERY — TRANSCAROTID ARTERY REVASCULARIZATION (TCAR)
Anesthesia: General | Site: Neck | Laterality: Right

## 2020-06-12 MED ORDER — GLYCOPYRROLATE 0.2 MG/ML IJ SOLN
INTRAMUSCULAR | Status: DC | PRN
  Administered 2020-06-12: .2 mg via INTRAVENOUS

## 2020-06-12 MED ORDER — 0.9 % SODIUM CHLORIDE (POUR BTL) OPTIME
TOPICAL | Status: DC | PRN
  Administered 2020-06-12: 1000 mL

## 2020-06-12 MED ORDER — FENTANYL CITRATE (PF) 100 MCG/2ML IJ SOLN: 25.0000 ug | INTRAMUSCULAR | Status: DC | PRN

## 2020-06-12 MED ORDER — CEFAZOLIN SODIUM-DEXTROSE 2-4 GM/100ML-% IV SOLN
INTRAVENOUS | Status: AC
  Filled 2020-06-12: qty 100

## 2020-06-12 MED ORDER — PROPOFOL 10 MG/ML IV BOLUS
INTRAVENOUS | Status: AC
  Filled 2020-06-12: qty 20

## 2020-06-12 MED ORDER — PANTOPRAZOLE SODIUM 40 MG PO TBEC
40.0000 mg | DELAYED_RELEASE_TABLET | Freq: Every day | ORAL | Status: DC
  Administered 2020-06-13: 40 mg via ORAL
  Filled 2020-06-12: qty 1

## 2020-06-12 MED ORDER — ALUM & MAG HYDROXIDE-SIMETH 200-200-20 MG/5ML PO SUSP: 15.0000 mL | ORAL | Status: DC | PRN

## 2020-06-12 MED ORDER — ACETAMINOPHEN 10 MG/ML IV SOLN: 1000.0000 mg | Freq: Once | INTRAVENOUS | Status: DC | PRN

## 2020-06-12 MED ORDER — EPHEDRINE SULFATE 50 MG/ML IJ SOLN
INTRAMUSCULAR | Status: DC | PRN
  Administered 2020-06-12: 5 mg via INTRAVENOUS
  Administered 2020-06-12: 10 mg via INTRAVENOUS
  Administered 2020-06-12: 5 mg via INTRAVENOUS

## 2020-06-12 MED ORDER — ROCURONIUM BROMIDE 10 MG/ML (PF) SYRINGE
PREFILLED_SYRINGE | INTRAVENOUS | Status: DC | PRN
  Administered 2020-06-12: 80 mg via INTRAVENOUS

## 2020-06-12 MED ORDER — FENTANYL CITRATE (PF) 250 MCG/5ML IJ SOLN
INTRAMUSCULAR | Status: AC
  Filled 2020-06-12: qty 5

## 2020-06-12 MED ORDER — PHENOL 1.4 % MT LIQD: 1.0000 | OROMUCOSAL | Status: DC | PRN

## 2020-06-12 MED ORDER — LIDOCAINE HCL (PF) 1 % IJ SOLN
INTRAMUSCULAR | Status: AC
  Filled 2020-06-12: qty 30

## 2020-06-12 MED ORDER — ONDANSETRON HCL 4 MG/2ML IJ SOLN: 4.0000 mg | Freq: Four times a day (QID) | INTRAMUSCULAR | Status: DC | PRN

## 2020-06-12 MED ORDER — SODIUM CHLORIDE 0.9 % IV SOLN
INTRAVENOUS | Status: AC
  Filled 2020-06-12: qty 1.2

## 2020-06-12 MED ORDER — METOPROLOL TARTRATE 5 MG/5ML IV SOLN
2.0000 mg | INTRAVENOUS | Status: DC | PRN
  Administered 2020-06-12: 2.5 mg via INTRAVENOUS
  Filled 2020-06-12: qty 5

## 2020-06-12 MED ORDER — OXYCODONE HCL 5 MG PO TABS: 5.0000 mg | ORAL_TABLET | Freq: Once | ORAL | Status: DC | PRN

## 2020-06-12 MED ORDER — LACTATED RINGERS IV SOLN: INTRAVENOUS | Status: DC | PRN

## 2020-06-12 MED ORDER — PHENYLEPHRINE HCL-NACL 10-0.9 MG/250ML-% IV SOLN
INTRAVENOUS | Status: DC | PRN
  Administered 2020-06-12: 25 ug/min via INTRAVENOUS

## 2020-06-12 MED ORDER — CEFAZOLIN SODIUM-DEXTROSE 2-3 GM-%(50ML) IV SOLR
INTRAVENOUS | Status: DC | PRN
  Administered 2020-06-12: 2 g via INTRAVENOUS

## 2020-06-12 MED ORDER — POTASSIUM CHLORIDE CRYS ER 20 MEQ PO TBCR: 20.0000 meq | EXTENDED_RELEASE_TABLET | Freq: Every day | ORAL | Status: DC | PRN

## 2020-06-12 MED ORDER — HYDRALAZINE HCL 20 MG/ML IJ SOLN: 5.0000 mg | INTRAMUSCULAR | Status: DC | PRN

## 2020-06-12 MED ORDER — PROTAMINE SULFATE 10 MG/ML IV SOLN
INTRAVENOUS | Status: DC | PRN
  Administered 2020-06-12: 50 mg via INTRAVENOUS

## 2020-06-12 MED ORDER — ACETAMINOPHEN 500 MG PO TABS: 1000.0000 mg | ORAL_TABLET | Freq: Once | ORAL | Status: DC | PRN

## 2020-06-12 MED ORDER — GUAIFENESIN-DM 100-10 MG/5ML PO SYRP: 15.0000 mL | ORAL_SOLUTION | ORAL | Status: DC | PRN

## 2020-06-12 MED ORDER — ONDANSETRON HCL 4 MG/2ML IJ SOLN
INTRAMUSCULAR | Status: DC | PRN
  Administered 2020-06-12: 4 mg via INTRAVENOUS

## 2020-06-12 MED ORDER — MAGNESIUM SULFATE 2 GM/50ML IV SOLN: 2.0000 g | Freq: Every day | INTRAVENOUS | Status: DC | PRN

## 2020-06-12 MED ORDER — SODIUM CHLORIDE 0.9 % IV SOLN: INTRAVENOUS | Status: DC

## 2020-06-12 MED ORDER — FENTANYL CITRATE (PF) 250 MCG/5ML IJ SOLN
INTRAMUSCULAR | Status: DC | PRN
  Administered 2020-06-12: 100 ug via INTRAVENOUS
  Administered 2020-06-12 (×4): 25 ug via INTRAVENOUS

## 2020-06-12 MED ORDER — LIDOCAINE 2% (20 MG/ML) 5 ML SYRINGE
INTRAMUSCULAR | Status: DC | PRN
  Administered 2020-06-12: 40 mg via INTRAVENOUS

## 2020-06-12 MED ORDER — PROPOFOL 10 MG/ML IV BOLUS
INTRAVENOUS | Status: DC | PRN
  Administered 2020-06-12: 150 mg via INTRAVENOUS

## 2020-06-12 MED ORDER — SODIUM CHLORIDE 0.9 % IV SOLN: 500.0000 mL | Freq: Once | INTRAVENOUS | Status: DC | PRN

## 2020-06-12 MED ORDER — HEMOSTATIC AGENTS (NO CHARGE) OPTIME
TOPICAL | Status: DC | PRN
  Administered 2020-06-12: 1 via TOPICAL

## 2020-06-12 MED ORDER — HEPARIN SODIUM (PORCINE) 1000 UNIT/ML IJ SOLN
INTRAMUSCULAR | Status: DC | PRN
  Administered 2020-06-12: 10000 [IU] via INTRAVENOUS

## 2020-06-12 MED ORDER — OXYCODONE HCL 5 MG/5ML PO SOLN: 5.0000 mg | Freq: Once | ORAL | Status: DC | PRN

## 2020-06-12 MED ORDER — DEXAMETHASONE SODIUM PHOSPHATE 10 MG/ML IJ SOLN
INTRAMUSCULAR | Status: DC | PRN
  Administered 2020-06-12: 5 mg via INTRAVENOUS

## 2020-06-12 MED ORDER — ACETAMINOPHEN 160 MG/5ML PO SOLN: 1000.0000 mg | Freq: Once | ORAL | Status: DC | PRN

## 2020-06-12 MED ORDER — SODIUM CHLORIDE 0.9 % IV SOLN
INTRAVENOUS | Status: DC | PRN
  Administered 2020-06-12: 09:00:00 500 mL

## 2020-06-12 MED ORDER — CEFAZOLIN SODIUM-DEXTROSE 2-4 GM/100ML-% IV SOLN
2.0000 g | Freq: Three times a day (TID) | INTRAVENOUS | Status: AC
  Administered 2020-06-12 – 2020-06-13 (×2): 2 g via INTRAVENOUS
  Filled 2020-06-12 (×2): qty 100

## 2020-06-12 MED ORDER — INSULIN ASPART 100 UNIT/ML ~~LOC~~ SOLN
5.0000 [IU] | Freq: Once | SUBCUTANEOUS | Status: AC
  Administered 2020-06-12: 5 [IU] via SUBCUTANEOUS

## 2020-06-12 MED ORDER — IODIXANOL 320 MG/ML IV SOLN
INTRAVENOUS | Status: DC | PRN
  Administered 2020-06-12: 50 mL via INTRA_ARTERIAL

## 2020-06-12 MED ORDER — LABETALOL HCL 5 MG/ML IV SOLN: 10.0000 mg | INTRAVENOUS | Status: DC | PRN

## 2020-06-12 MED ORDER — SUGAMMADEX SODIUM 200 MG/2ML IV SOLN
INTRAVENOUS | Status: DC | PRN
  Administered 2020-06-12: 300 mg via INTRAVENOUS

## 2020-06-12 SURGICAL SUPPLY — 56 items
ADH SKN CLS APL DERMABOND .7 (GAUZE/BANDAGES/DRESSINGS) ×2
BAG BANDED W/RUBBER/TAPE 36X54 (MISCELLANEOUS) ×3 IMPLANT
BAG EQP BAND 135X91 W/RBR TAPE (MISCELLANEOUS) ×1
BALLN STERLING RX 5X30X80 (BALLOONS) ×3
BALLOON STERLING RX 5X30X80 (BALLOONS) IMPLANT
CANISTER SUCT 3000ML PPV (MISCELLANEOUS) ×3 IMPLANT
CATH ANGIO 5F BER2 65CM (CATHETERS) ×2 IMPLANT
CATH ROBINSON RED A/P 18FR (CATHETERS) IMPLANT
CATH SUCT 10FR WHISTLE TIP (CATHETERS) ×3 IMPLANT
CLIP VESOCCLUDE MED 6/CT (CLIP) ×3 IMPLANT
CLIP VESOCCLUDE SM WIDE 6/CT (CLIP) ×3 IMPLANT
COVER DOME SNAP 22 D (MISCELLANEOUS) ×3 IMPLANT
COVER PROBE W GEL 5X96 (DRAPES) ×3 IMPLANT
COVER WAND RF STERILE (DRAPES) ×1 IMPLANT
DERMABOND ADVANCED (GAUZE/BANDAGES/DRESSINGS) ×4
DERMABOND ADVANCED .7 DNX12 (GAUZE/BANDAGES/DRESSINGS) ×1 IMPLANT
DRAPE FEMORAL ANGIO 80X135IN (DRAPES) ×4 IMPLANT
DRAPE INCISE IOBAN 66X45 STRL (DRAPES) ×2 IMPLANT
ELECT REM PT RETURN 9FT ADLT (ELECTROSURGICAL) ×3
ELECTRODE REM PT RTRN 9FT ADLT (ELECTROSURGICAL) ×1 IMPLANT
GLOVE BIOGEL PI IND STRL 7.5 (GLOVE) ×1 IMPLANT
GLOVE BIOGEL PI INDICATOR 7.5 (GLOVE) ×4
GLOVE SURG SS PI 7.5 STRL IVOR (GLOVE) ×3 IMPLANT
GOWN STRL REUS W/ TWL LRG LVL3 (GOWN DISPOSABLE) ×2 IMPLANT
GOWN STRL REUS W/ TWL XL LVL3 (GOWN DISPOSABLE) ×1 IMPLANT
GOWN STRL REUS W/TWL LRG LVL3 (GOWN DISPOSABLE) ×6
GOWN STRL REUS W/TWL XL LVL3 (GOWN DISPOSABLE) ×3
GUIDEWIRE ENROUTE 0.014 (WIRE) ×3 IMPLANT
HEMOSTAT SNOW SURGICEL 2X4 (HEMOSTASIS) ×2 IMPLANT
INTRODUCER KIT GALT 7CM (INTRODUCER) ×3
KIT BASIN OR (CUSTOM PROCEDURE TRAY) ×3 IMPLANT
KIT ENCORE 26 ADVANTAGE (KITS) ×5 IMPLANT
KIT INTRODUCER GALT 7 (INTRODUCER) ×1 IMPLANT
KIT TURNOVER KIT B (KITS) ×3 IMPLANT
NDL HYPO 25GX1X1/2 BEV (NEEDLE) IMPLANT
NEEDLE HYPO 25GX1X1/2 BEV (NEEDLE) IMPLANT
PACK CAROTID (CUSTOM PROCEDURE TRAY) ×3 IMPLANT
POSITIONER HEAD DONUT 9IN (MISCELLANEOUS) ×3 IMPLANT
SET MICROPUNCTURE 5F STIFF (MISCELLANEOUS) ×2 IMPLANT
STENT TRANSCAROTID SYSTEM 8X40 (Permanent Stent) ×2 IMPLANT
SUT MNCRL AB 4-0 PS2 18 (SUTURE) ×2 IMPLANT
SUT PROLENE 5 0 C 1 24 (SUTURE) ×6 IMPLANT
SUT PROLENE 6 0 BV (SUTURE) IMPLANT
SUT SILK 2 0 PERMA HAND 18 BK (SUTURE) ×3 IMPLANT
SUT SILK 2 0 SH (SUTURE) ×3 IMPLANT
SUT VIC AB 3-0 SH 27 (SUTURE) ×6
SUT VIC AB 3-0 SH 27X BRD (SUTURE) ×2 IMPLANT
SUT VIC AB 4-0 PS2 27 (SUTURE) ×3 IMPLANT
SYR 10ML LL (SYRINGE) ×9 IMPLANT
SYR 20ML LL LF (SYRINGE) ×3 IMPLANT
SYR CONTROL 10ML LL (SYRINGE) IMPLANT
SYSTEM TRANSCAROTID NEUROPRTCT (MISCELLANEOUS) ×1 IMPLANT
TOWEL GREEN STERILE (TOWEL DISPOSABLE) ×3 IMPLANT
TRANSCAROTID NEUROPROTECT SYS (MISCELLANEOUS) ×3
WATER STERILE IRR 1000ML POUR (IV SOLUTION) ×3 IMPLANT
WIRE BENTSON .035X145CM (WIRE) ×3 IMPLANT

## 2020-06-12 NOTE — Progress Notes (Signed)
Transferred from PACU Vital sign monitoring initiated, CCMD notified.  Bedrest until 1330. Neuro intact.  L groin level 0

## 2020-06-12 NOTE — Progress Notes (Signed)
Mobility Specialist: Progress Note   06/12/20 1532  Mobility  Activity Transferred:  Bed to chair  Level of Assistance Minimal assist, patient does 75% or more  Assistive Device Front wheel walker  Distance Ambulated (ft) 4 ft  Mobility Response Tolerated well  Mobility performed by Mobility specialist  Bed Position Chair  $Mobility charge 1 Mobility   Pre-Mobility: 113 HR, 126/71 BP, 96% SpO2  During-Mobility: 154 HR  Pt transferred to chair from bed. Pt's HR peaked at 154 bpm, RN present.   [facility] Marcellina Jonsson Mobility Specialist

## 2020-06-12 NOTE — Progress Notes (Signed)
PROGRESS NOTE    Reginald [name]  DJS:[id number] DOB: April 05, 1936 DOA: 06/09/2020 PCP: Patient, No Pcp Per    No chief complaint on file.   Brief Narrative:  84 year old gentleman prior history of COPD, coronary artery disease, hypertension, hyperlipidemia, obstructive sleep apnea, diabetes, atrial fibrillation on anticoagulation, presents to ED for evaluation of painless vision loss in the right eye since Sunday afternoon.  Patient denies any other neurological deficits at this time.  MRI of the brain and orbit show a probable subacute infarct in the right precentral gyrus versus infarct.  Neurology recommended vascular surgery consultation for revascularization of the right carotid.  Vascular surgery recommended a right TCAR scheduled on 06/12/20. No new complaints today.   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 Vascular surgery consulted and plan for right TCAR on 06/12/2020. He was loaded on Plavix and currently on 75 mg of Plavix daily. Echocardiogram shows Left ventricular ejection fraction, by estimation, is 55 to 60%. The  left ventricle has normal function. The left ventricle has no regional  wall motion abnormalities. There is moderate left ventricular hypertrophy.  Left ventricular diastolic  parameters are indeterminate.  nonew complaints.   History of coronary artery disease Patient currently denies any chest pain, continue  with aspirin, Plavix. eliquis to be started on discharge.   Obstructive sleep apnea continue with CPAP at this time.   Atrial fibrillation Rate controlled, eliquis to be started on discharge.    Stage IV CKD Creatinine at baseline excuse   Chronic diastolic heart failure Patient appears to be compensated at this time.  He denies any shortness of breath or worsening leg edema this time. Continue with metoprolol 25 mg daily   Type 2 diabetes mellitus CBG (last 3)  Recent Labs    06/11/20 1246 06/11/20 1655 06/11/20 2201  GLUCAP 232* 178* 142*   Continue with sliding scale insulin. No changes in meds.    Diabetic neuropathy Continue with gabapentin 200 mg daily at bedtime.   Constipation Continue with Senokot and MiraLAX.  Chronic venous insufficiency leading to dermatitis of lower extremities.  Body mass index is 43.31 kg/m. Morbid obesity Recommend outpatient follow-up with PCP for weight loss.  Hyperlipidemia Continue with Lipitor.    COPD No wheezing heard on exam today   History of COVID-19 infection Patient currently denies any chest pain or shortness of breath at this time.  DVT prophylaxis: (SCDs Code Status: Full code Family Communication: (None at bedside Disposition:   Status is: Inpatient  Remains inpatient appropriate because:Ongoing diagnostic testing needed not appropriate for outpatient work up, Unsafe d/c plan and IV treatments appropriate due to intensity of illness or inability to take PO   Dispo: The patient is from: SNF              Anticipated d/c is to: SNF              Anticipated d/c date is: 2  days              Patient currently is not medically stable to d/c.       Consultants:   Vascular surgery consult  Neurology  Procedures: None Antimicrobials: None  Subjective: No new complaints.  Objective: Vitals:   06/11/20 1654 06/11/20 2019 06/12/20 0039 06/12/20 0459  BP: 124/61 (!) 145/85 138/67 137/76    Pulse: 67 88 (!) 55 (!) 54  Resp: 18 18 18 18   Temp: 97.6 F (36.4 C) 98.2 F (36.8 C) (!) 97.3 F (36.3 C) 98.7 F (37.1 C)  TempSrc: Oral Oral Oral   SpO2: 94% 92% 91% 93%  Weight:      Height:        Intake/Output Summary (Last 24 hours) at 06/12/2020 0846 Last data filed at 06/11/2020 2300 Gross per 24 hour  Intake --  Output 800 ml  Net -800 ml   Filed Weights   06/09/20 1240  Weight: (!) 140.8 kg    Examination:  General exam: alert and comfortable.  Respiratory system: clear to ausculation, no wheezing or rhonchi.  Cardiovascular system: S1 & S2 heard, RRR, no JVD.  Gastrointestinal system: Abd is soft, NT ND BS+ Central nervous system: Alert and oriented.  Extremities: no cyanosis or clubbing.  Skin: No rashes seen.  Psychiatry:  Mood is appropriate.     Data Reviewed: I have personally reviewed following labs and imaging studies  CBC: Recent Labs  Lab 06/09/20 1315 06/12/20 0207  WBC 8.3 9.2  NEUTROABS 5.1  --   HGB 16.6 16.4  HCT 52.2* 48.6  MCV 93.4 89.5  PLT 177 235    Basic Metabolic Panel: Recent Labs  Lab 06/09/20 1315 06/11/20 0506 06/12/20 0207  NA 138 139 139  K 4.4 4.0 4.1  CL 101 102 102  CO2 29 26 26   GLUCOSE 177* 164* 159*  BUN 23 21 26*  CREATININE 1.58* 1.66* 1.84*  CALCIUM 9.6 9.8 10.3    GFR: Estimated Creatinine Clearance: 44.4 mL/min (A) (by C-G formula based on SCr of 1.84 mg/dL (H)).  Liver Function Tests: Recent Labs  Lab 06/09/20 1315  AST 28  ALT 32  ALKPHOS 60  BILITOT 1.0  PROT 6.6  ALBUMIN 3.8    CBG: Recent Labs  Lab 06/10/20 2114 06/11/20 0607 06/11/20 1246 06/11/20 1655 06/11/20 2201  GLUCAP 193* 164* 232* 178* 142*     Recent Results (from the past 240 hour(s))  Resp Panel by RT-PCR (Flu A&B, Covid) Nasopharyngeal Swab     Status: None   Collection Time: 06/09/20  4:58 PM   Specimen: Nasopharyngeal Swab; Nasopharyngeal(NP) swabs in vial transport medium  Result Value Ref Range  Status   SARS Coronavirus 2 by RT PCR NEGATIVE NEGATIVE Final    Comment: (NOTE) SARS-CoV-2 target nucleic acids are NOT DETECTED.  The SARS-CoV-2 RNA is generally detectable in upper respiratory specimens during the acute phase of infection. The lowest concentration of SARS-CoV-2 viral copies this assay can detect is 138 copies/mL. A negative result does not preclude SARS-Cov-2 infection and should not be used as the sole basis for treatment or other patient management decisions. A negative result may occur with  improper specimen collection/handling, submission of specimen other than nasopharyngeal swab, presence of viral mutation(s) within the areas targeted by this assay, and inadequate number of viral copies(<138 copies/mL). A negative result must be combined with clinical observations, patient history, and epidemiological information. The expected result is Negative.  Fact Sheet  for Patients:  EntrepreneurPulse.com.au  Fact Sheet for Healthcare Providers:  IncredibleEmployment.be  This test is no t yet approved or cleared by the [place] FDA and  has been authorized for detection and/or diagnosis of SARS-CoV-2 by FDA under an Emergency Use Authorization (EUA). This EUA will remain  in effect (meaning this test can be used) for the duration of the COVID-19 declaration under Section 564(b)(1) of the Act, 21 U.S.C.section 360bbb-3(b)(1), unless the authorization is terminated  or revoked sooner.       Influenza A by PCR NEGATIVE NEGATIVE Final   Influenza B by PCR NEGATIVE NEGATIVE Final    Comment: (NOTE) The Xpert Xpress SARS-CoV-2/FLU/RSV plus assay is intended as an aid in the diagnosis of influenza from Nasopharyngeal swab specimens and should not be used as a sole basis for treatment. Nasal washings and aspirates are unacceptable for Xpert Xpress SARS-CoV-2/FLU/RSV testing.  Fact Sheet for  Patients: EntrepreneurPulse.com.au  Fact Sheet for Healthcare Providers: IncredibleEmployment.be  This test is not yet approved or cleared by the [place] FDA and has been authorized for detection and/or diagnosis of SARS-CoV-2 by FDA under an Emergency Use Authorization (EUA). This EUA will remain in effect (meaning this test can be used) for the duration of the COVID-19 declaration under Section 564(b)(1) of the Act, 21 U.S.C. section 360bbb-3(b)(1), unless the authorization is terminated or revoked.  Performed at [facility], [place] [address] [place], [place] [address]          Radiology Studies: HYBRID OR IMAGING ([place])  Result Date: 06/12/2020 There is no interpretation for this exam.  This order is for images obtained during a surgical procedure.  Please See "Surgeries" Tab for more information regarding the procedure.        Scheduled Meds: . [MAR Hold] aspirin EC  81 mg Oral Daily  . [MAR Hold] atorvastatin  40 mg Oral QHS  . [MAR Hold] cholecalciferol  2,000 Units Oral Daily  . [MAR Hold] clopidogrel  75 mg Oral Daily  . [MAR Hold] docusate sodium  100 mg Oral QHS  . [MAR Hold] gabapentin  200 mg Oral QHS  . [MAR Hold] insulin aspart  0-9 Units Subcutaneous TID WC  . [MAR Hold] latanoprost  1 drop Both Eyes QHS  . [MAR Hold] loratadine  10 mg Oral Daily  . [MAR Hold] magnesium oxide  400 mg Oral Daily  . [MAR Hold] melatonin  5 mg Oral QHS  . [MAR Hold] metoprolol succinate  25 mg Oral Daily  . [MAR Hold] polyethylene glycol  17 g Oral Daily  . [MAR Hold] polyvinyl alcohol  1 drop Both Eyes QID  . [MAR Hold] tamsulosin  0.4 mg Oral QPC supper  . [MAR Hold] vitamin B-12  1,000 mcg Oral Daily   Continuous Infusions: . [MAR Hold]  ceFAZolin (ANCEF) IV       LOS: 2 days        Hosie Poisson, MD [facility]   To contact the attending provider between 7A-7P or the covering provider  during after hours 7P-7A, please log into the web site [email or web address] and access using universal [place] password for that web site. If you do not have the password, please call the hospital operator.  06/12/2020, 8:46 AM

## 2020-06-12 NOTE — Progress Notes (Signed)
Patient had a blood glucose level of 307 and he has no night insulin coverage.  Informed the on call Hospitalist, who ordered a one time subcutaneous Novolog insulin 5 Units and to recheck the patient's blood glucose at 0000.  Will continue to monitor.  Lupita Dawn, RN

## 2020-06-12 NOTE — Anesthesia Procedure Notes (Signed)
Procedure Name: Intubation Date/Time: 06/12/2020 7:59 AM Performed by: Clearnce Sorrel, CRNA Pre-anesthesia Checklist: Patient identified, Emergency Drugs available, Suction available, Patient being monitored and Timeout performed Patient Re-evaluated:Patient Re-evaluated prior to induction Oxygen Delivery Method: Circle system utilized Preoxygenation: Pre-oxygenation with 100% oxygen Induction Type: IV induction Ventilation: Mask ventilation without difficulty and Oral airway inserted - appropriate to patient size Laryngoscope Size: Mac and 4 Grade View: Grade I Tube type: Oral Tube size: 7.5 mm Number of attempts: 1 Airway Equipment and Method: Stylet Placement Confirmation: ETT inserted through vocal cords under direct vision,  positive ETCO2 and breath sounds checked- equal and bilateral Secured at: 23 cm Tube secured with: Tape Dental Injury: Teeth and Oropharynx as per pre-operative assessment

## 2020-06-12 NOTE — Op Note (Signed)
Patient name: Reginald [name] MRN: [id number] DOB: 10-13-1935 Sex: male  06/12/2020 Pre-operative Diagnosis: symaptomatic Post-operative diagnosis:  Same Surgeon:  Annamarie Major Assistants:  Risa Grill Procedure:   #1: Right transcarotid artery revascularization (carotid stent)   #2: Ultrasound-guided access, left femoral vein   #3: Flow reversal neuro protection Anesthesia: General Blood Loss: Minimal Specimens: None  Findings: No residual stenosis post stenting  Indications: The patient presented with right retinal artery occlusion.  MRI was consistent with stroke.  He was found to have high-grade right carotid stenosis as the etiology.  We discussed proceeding with TCAR.   Predilation balloon 5 x 30  ENROUTE by forty Flow reversal time: 8 minutes Contrast volume: 20 cc Dose area: 2433.97  Fluoroscopy: 3.7 minutes Procedure time: 50 minutes  Procedure:  The patient was identified in the holding area and taken to [place] [address]  The patient was then placed supine on the table. general anesthesia was administered.  The patient was prepped and draped in the usual sterile fashion.  A time out was called and antibiotics were administered.  A PA was necessary to expedite the procedure and assist with technical details  Ultrasound was used to evaluate the left common femoral vein which was widely patent and easily compressible.  The vein was cannulated under ultrasound guidance with a micropuncture needle.  A 018 wire was inserted followed by micropuncture sheath.  A Bentson wire was then inserted followed by placement of the TCAR sheath which flushed appropriately  Ultrasound identified the location of the right carotid artery above the clavicle.  A transverse incision was made.  Cautery was used about the subcutaneous tissue and platysma.  I bluntly split the two heads of the sternocleidomastoid and then sharply dissected out the common carotid artery which was disease-free.  It was  encircled with a umbilical tape and Potts vessel loop.  The patient was fully heparinized.  ACT was confirmed to be greater than 300.  A 5-0 Prolene U stitch was placed and the habitation of the common carotid artery.  The artery was then cannulated with a micropuncture needle.  A 018 wire was inserted up to the mark and a micropuncture sheath was inserted to 3 cm.  A contrast injection was performed locating the carotid bifurcation.  A carotid Amplatz was then inserted up to the mark and the TCAR sheath was then placed.  This was sutured into position with 2-0 Vicryl suture.  The flow reversal tubing was connected.  Passive flow reversal was confirmed with a saline flush.  Next, a TCAR timeout was performed.  Systolic blood pressures were greater than 160.  ACT was greater than 300.  Active flow reversal was then initiated and confirmed with a saline flush.  The internal carotid artery was then cannulated with an 014 wire.  The lesion was then predilated using a 5 x 30 balloon with mild hemodynamic response.  I then selected a 8 x 40 stent and deployed it across the lesion.  I waited 2 minutes and perform completion imaging and several obliques.  The stent was widely patent with no stenosis.  There is no evidence of access site complication.  Th  wire was then removed and active flow reversal was discontinued.  The sheath was removed and the arteriotomy site closed by securing the 5-0 Prolene.  Hand-held Doppler revealed excellent signals in the common carotid artery.  The heparin was reversed with 50 mg of protamine.  The sheath in the  groin was removed and manual pressure was held for 15 minutes for hemostasis.  The carotid incision was hemostatic.  It was irrigated.  The platysma was closed with 3-0 Vicryl and the subcutaneous tissue was closed with 3-0 Vicryl followed by Dermabond.  The patient was successfully extubated taken to recovery in stable condition.  There are no immediate complications.  He is  moving all four extremities to command.  Disposition: PACU stable.   Theotis Burrow, M.D., [facility] [place] Office: [phone] Pager:  [phone]

## 2020-06-12 NOTE — Anesthesia Preprocedure Evaluation (Addendum)
Anesthesia Evaluation  Patient identified by MRN, date of birth, ID band Patient awake    Reviewed: Allergy & Precautions, NPO status , Patient's Chart, lab work & pertinent test results  History of Anesthesia Complications Negative for: history of anesthetic complications  Airway Mallampati: III       Dental  (+) Dental Advisory Given   Pulmonary sleep apnea , COPD,    breath sounds clear to auscultation       Cardiovascular hypertension, Pt. on home beta blockers and Pt. on medications + CAD, + Past MI, + Peripheral Vascular Disease and +CHF   Rhythm:Regular     Neuro/Psych  Neuromuscular disease    GI/Hepatic   Endo/Other  diabetesLab Results      Component                Value               Date                      HGBA1C                   8.1 (H)             05/28/2020             Renal/GU CRFRenal diseaseLab Results      Component                Value               Date                      CREATININE               1.84 (H)            06/12/2020           Lab Results      Component                Value               Date                      K                        4.1                 06/12/2020                Musculoskeletal   Abdominal   Peds  Hematology   Anesthesia Other Findings   Reproductive/Obstetrics                            Anesthesia Physical Anesthesia Plan  ASA: III  Anesthesia Plan: General   Post-op Pain Management:    Induction: Intravenous  PONV Risk Score and Plan: 2 and Ondansetron and Dexamethasone  Airway Management Planned: Oral ETT  Additional Equipment: Arterial line  Intra-op Plan:   Post-operative Plan: Extubation in OR  Informed Consent: I have reviewed the patients History and Physical, chart, labs and discussed the procedure including the risks, benefits and alternatives for the proposed anesthesia with the patient or authorized  representative who has indicated his/her understanding and acceptance.     Dental advisory given  Plan Discussed with: CRNA and Surgeon  Anesthesia Plan  Comments:         Anesthesia Quick Evaluation

## 2020-06-12 NOTE — Interval H&P Note (Signed)
History and Physical Interval Note:  06/12/2020 7:31 AM  Reginald [name]  has presented today for surgery, with the diagnosis of RIGHT CAROTID ARTERY STENOSIS.  The various methods of treatment have been discussed with the patient and family. After consideration of risks, benefits and other options for treatment, the patient has consented to  Procedure(s): RIGHT TRANSCAROTID ARTERY REVASCULARIZATION (Right) as a surgical intervention.  The patient's history has been reviewed, patient examined, no change in status, stable for surgery.  I have reviewed the patient's chart and labs.  Questions were answered to the patient's satisfaction.     Annamarie Major

## 2020-06-12 NOTE — Transfer of Care (Signed)
Immediate Anesthesia Transfer of Care Note  Patient: Reginald [name]  Procedure(s) Performed: RIGHT TRANSCAROTID ARTERY REVASCULARIZATION (Right Neck)  Patient Location: PACU  Anesthesia Type:General  Level of Consciousness: awake and patient cooperative  Airway & Oxygen Therapy: Patient Spontanous Breathing and Patient connected to face mask oxygen  Post-op Assessment: Report given to RN and Post -op Vital signs reviewed and stable  Post vital signs: Reviewed and stable  Last Vitals:  Vitals Value Taken Time  BP 119/69 06/12/20 0948  Temp    Pulse 106 06/12/20 0950  Resp 20 06/12/20 0950  SpO2 96 % 06/12/20 0950  Vitals shown include unvalidated device data.  Last Pain:  Vitals:   06/12/20 0039  TempSrc: Oral  PainSc:          Complications: No complications documented.

## 2020-06-13 DIAGNOSIS — I7389 Other specified peripheral vascular diseases: Secondary | ICD-10-CM | POA: Diagnosis not present

## 2020-06-13 DIAGNOSIS — E7849 Other hyperlipidemia: Secondary | ICD-10-CM | POA: Diagnosis not present

## 2020-06-13 DIAGNOSIS — E1122 Type 2 diabetes mellitus with diabetic chronic kidney disease: Secondary | ICD-10-CM | POA: Diagnosis present

## 2020-06-13 DIAGNOSIS — Z87438 Personal history of other diseases of male genital organs: Secondary | ICD-10-CM | POA: Diagnosis not present

## 2020-06-13 DIAGNOSIS — H2513 Age-related nuclear cataract, bilateral: Secondary | ICD-10-CM | POA: Diagnosis not present

## 2020-06-13 DIAGNOSIS — I6381 Other cerebral infarction due to occlusion or stenosis of small artery: Secondary | ICD-10-CM | POA: Diagnosis present

## 2020-06-13 DIAGNOSIS — R739 Hyperglycemia, unspecified: Secondary | ICD-10-CM | POA: Diagnosis not present

## 2020-06-13 DIAGNOSIS — I130 Hypertensive heart and chronic kidney disease with heart failure and stage 1 through stage 4 chronic kidney disease, or unspecified chronic kidney disease: Secondary | ICD-10-CM | POA: Diagnosis present

## 2020-06-13 DIAGNOSIS — N1831 Chronic kidney disease, stage 3a: Secondary | ICD-10-CM | POA: Diagnosis present

## 2020-06-13 DIAGNOSIS — H3411 Central retinal artery occlusion, right eye: Secondary | ICD-10-CM | POA: Diagnosis not present

## 2020-06-13 DIAGNOSIS — R402411 Glasgow coma scale score 13-15, in the field [EMT or ambulance]: Secondary | ICD-10-CM | POA: Diagnosis not present

## 2020-06-13 DIAGNOSIS — R531 Weakness: Secondary | ICD-10-CM | POA: Diagnosis present

## 2020-06-13 DIAGNOSIS — M6259 Muscle wasting and atrophy, not elsewhere classified, multiple sites: Secondary | ICD-10-CM | POA: Diagnosis not present

## 2020-06-13 DIAGNOSIS — R2689 Other abnormalities of gait and mobility: Secondary | ICD-10-CM | POA: Diagnosis not present

## 2020-06-13 DIAGNOSIS — Z9189 Other specified personal risk factors, not elsewhere classified: Secondary | ICD-10-CM | POA: Diagnosis not present

## 2020-06-13 DIAGNOSIS — M25522 Pain in left elbow: Secondary | ICD-10-CM | POA: Diagnosis not present

## 2020-06-13 DIAGNOSIS — N400 Enlarged prostate without lower urinary tract symptoms: Secondary | ICD-10-CM | POA: Diagnosis present

## 2020-06-13 DIAGNOSIS — I480 Paroxysmal atrial fibrillation: Secondary | ICD-10-CM | POA: Diagnosis not present

## 2020-06-13 DIAGNOSIS — I2510 Atherosclerotic heart disease of native coronary artery without angina pectoris: Secondary | ICD-10-CM | POA: Diagnosis present

## 2020-06-13 DIAGNOSIS — E782 Mixed hyperlipidemia: Secondary | ICD-10-CM | POA: Diagnosis not present

## 2020-06-13 DIAGNOSIS — R195 Other fecal abnormalities: Secondary | ICD-10-CM | POA: Diagnosis not present

## 2020-06-13 DIAGNOSIS — I6602 Occlusion and stenosis of left middle cerebral artery: Secondary | ICD-10-CM | POA: Diagnosis not present

## 2020-06-13 DIAGNOSIS — N139 Obstructive and reflux uropathy, unspecified: Secondary | ICD-10-CM | POA: Diagnosis not present

## 2020-06-13 DIAGNOSIS — N3289 Other specified disorders of bladder: Secondary | ICD-10-CM | POA: Diagnosis present

## 2020-06-13 DIAGNOSIS — H5461 Unqualified visual loss, right eye, normal vision left eye: Secondary | ICD-10-CM | POA: Diagnosis not present

## 2020-06-13 DIAGNOSIS — E785 Hyperlipidemia, unspecified: Secondary | ICD-10-CM | POA: Diagnosis not present

## 2020-06-13 DIAGNOSIS — R471 Dysarthria and anarthria: Secondary | ICD-10-CM | POA: Diagnosis present

## 2020-06-13 DIAGNOSIS — Z8616 Personal history of COVID-19: Secondary | ICD-10-CM | POA: Diagnosis not present

## 2020-06-13 DIAGNOSIS — Z7901 Long term (current) use of anticoagulants: Secondary | ICD-10-CM | POA: Diagnosis not present

## 2020-06-13 DIAGNOSIS — I872 Venous insufficiency (chronic) (peripheral): Secondary | ICD-10-CM | POA: Diagnosis not present

## 2020-06-13 DIAGNOSIS — G9341 Metabolic encephalopathy: Secondary | ICD-10-CM | POA: Diagnosis not present

## 2020-06-13 DIAGNOSIS — R29898 Other symptoms and signs involving the musculoskeletal system: Secondary | ICD-10-CM | POA: Diagnosis not present

## 2020-06-13 DIAGNOSIS — I4821 Permanent atrial fibrillation: Secondary | ICD-10-CM | POA: Diagnosis not present

## 2020-06-13 DIAGNOSIS — J984 Other disorders of lung: Secondary | ICD-10-CM | POA: Diagnosis not present

## 2020-06-13 DIAGNOSIS — H52203 Unspecified astigmatism, bilateral: Secondary | ICD-10-CM | POA: Diagnosis not present

## 2020-06-13 DIAGNOSIS — M6258 Muscle wasting and atrophy, not elsewhere classified, other site: Secondary | ICD-10-CM | POA: Diagnosis not present

## 2020-06-13 DIAGNOSIS — R278 Other lack of coordination: Secondary | ICD-10-CM | POA: Diagnosis not present

## 2020-06-13 DIAGNOSIS — G4733 Obstructive sleep apnea (adult) (pediatric): Secondary | ICD-10-CM | POA: Diagnosis present

## 2020-06-13 DIAGNOSIS — I4891 Unspecified atrial fibrillation: Secondary | ICD-10-CM | POA: Diagnosis not present

## 2020-06-13 DIAGNOSIS — H349 Unspecified retinal vascular occlusion: Secondary | ICD-10-CM | POA: Diagnosis not present

## 2020-06-13 DIAGNOSIS — I129 Hypertensive chronic kidney disease with stage 1 through stage 4 chronic kidney disease, or unspecified chronic kidney disease: Secondary | ICD-10-CM | POA: Diagnosis not present

## 2020-06-13 DIAGNOSIS — R29810 Facial weakness: Secondary | ICD-10-CM | POA: Diagnosis present

## 2020-06-13 DIAGNOSIS — Z7984 Long term (current) use of oral hypoglycemic drugs: Secondary | ICD-10-CM | POA: Diagnosis not present

## 2020-06-13 DIAGNOSIS — Z794 Long term (current) use of insulin: Secondary | ICD-10-CM | POA: Diagnosis not present

## 2020-06-13 DIAGNOSIS — I959 Hypotension, unspecified: Secondary | ICD-10-CM | POA: Diagnosis not present

## 2020-06-13 DIAGNOSIS — R498 Other voice and resonance disorders: Secondary | ICD-10-CM | POA: Diagnosis not present

## 2020-06-13 DIAGNOSIS — G8194 Hemiplegia, unspecified affecting left nondominant side: Secondary | ICD-10-CM | POA: Diagnosis present

## 2020-06-13 DIAGNOSIS — R29705 NIHSS score 5: Secondary | ICD-10-CM | POA: Diagnosis present

## 2020-06-13 DIAGNOSIS — E1159 Type 2 diabetes mellitus with other circulatory complications: Secondary | ICD-10-CM | POA: Diagnosis not present

## 2020-06-13 DIAGNOSIS — R1312 Dysphagia, oropharyngeal phase: Secondary | ICD-10-CM | POA: Diagnosis not present

## 2020-06-13 DIAGNOSIS — H547 Unspecified visual loss: Secondary | ICD-10-CM | POA: Diagnosis not present

## 2020-06-13 DIAGNOSIS — I10 Essential (primary) hypertension: Secondary | ICD-10-CM | POA: Diagnosis not present

## 2020-06-13 DIAGNOSIS — J449 Chronic obstructive pulmonary disease, unspecified: Secondary | ICD-10-CM | POA: Diagnosis present

## 2020-06-13 DIAGNOSIS — I739 Peripheral vascular disease, unspecified: Secondary | ICD-10-CM | POA: Diagnosis not present

## 2020-06-13 DIAGNOSIS — Z6841 Body Mass Index (BMI) 40.0 and over, adult: Secondary | ICD-10-CM | POA: Diagnosis not present

## 2020-06-13 DIAGNOSIS — R609 Edema, unspecified: Secondary | ICD-10-CM | POA: Diagnosis not present

## 2020-06-13 DIAGNOSIS — I6521 Occlusion and stenosis of right carotid artery: Secondary | ICD-10-CM | POA: Diagnosis not present

## 2020-06-13 DIAGNOSIS — I4819 Other persistent atrial fibrillation: Secondary | ICD-10-CM | POA: Diagnosis present

## 2020-06-13 DIAGNOSIS — I6529 Occlusion and stenosis of unspecified carotid artery: Secondary | ICD-10-CM | POA: Diagnosis present

## 2020-06-13 DIAGNOSIS — G459 Transient cerebral ischemic attack, unspecified: Secondary | ICD-10-CM | POA: Diagnosis not present

## 2020-06-13 DIAGNOSIS — R2681 Unsteadiness on feet: Secondary | ICD-10-CM | POA: Diagnosis not present

## 2020-06-13 DIAGNOSIS — N179 Acute kidney failure, unspecified: Secondary | ICD-10-CM | POA: Diagnosis present

## 2020-06-13 DIAGNOSIS — R000 Tachycardia, unspecified: Secondary | ICD-10-CM | POA: Diagnosis not present

## 2020-06-13 DIAGNOSIS — R0902 Hypoxemia: Secondary | ICD-10-CM | POA: Diagnosis not present

## 2020-06-13 DIAGNOSIS — Z85828 Personal history of other malignant neoplasm of skin: Secondary | ICD-10-CM | POA: Diagnosis not present

## 2020-06-13 DIAGNOSIS — I5032 Chronic diastolic (congestive) heart failure: Secondary | ICD-10-CM | POA: Diagnosis present

## 2020-06-13 DIAGNOSIS — S0100XD Unspecified open wound of scalp, subsequent encounter: Secondary | ICD-10-CM | POA: Diagnosis not present

## 2020-06-13 DIAGNOSIS — R29818 Other symptoms and signs involving the nervous system: Secondary | ICD-10-CM | POA: Diagnosis not present

## 2020-06-13 DIAGNOSIS — Z8673 Personal history of transient ischemic attack (TIA), and cerebral infarction without residual deficits: Secondary | ICD-10-CM | POA: Diagnosis not present

## 2020-06-13 DIAGNOSIS — I639 Cerebral infarction, unspecified: Secondary | ICD-10-CM | POA: Diagnosis not present

## 2020-06-13 DIAGNOSIS — I509 Heart failure, unspecified: Secondary | ICD-10-CM | POA: Diagnosis not present

## 2020-06-13 DIAGNOSIS — Z7982 Long term (current) use of aspirin: Secondary | ICD-10-CM | POA: Diagnosis not present

## 2020-06-13 DIAGNOSIS — E1151 Type 2 diabetes mellitus with diabetic peripheral angiopathy without gangrene: Secondary | ICD-10-CM | POA: Diagnosis present

## 2020-06-13 DIAGNOSIS — H53131 Sudden visual loss, right eye: Secondary | ICD-10-CM | POA: Diagnosis not present

## 2020-06-13 DIAGNOSIS — I152 Hypertension secondary to endocrine disorders: Secondary | ICD-10-CM | POA: Diagnosis not present

## 2020-06-13 DIAGNOSIS — E662 Morbid (severe) obesity with alveolar hypoventilation: Secondary | ICD-10-CM | POA: Diagnosis not present

## 2020-06-13 DIAGNOSIS — U071 COVID-19: Secondary | ICD-10-CM | POA: Diagnosis not present

## 2020-06-13 DIAGNOSIS — M2550 Pain in unspecified joint: Secondary | ICD-10-CM | POA: Diagnosis not present

## 2020-06-13 DIAGNOSIS — E876 Hypokalemia: Secondary | ICD-10-CM | POA: Diagnosis present

## 2020-06-13 DIAGNOSIS — H524 Presbyopia: Secondary | ICD-10-CM | POA: Diagnosis not present

## 2020-06-13 DIAGNOSIS — Z79899 Other long term (current) drug therapy: Secondary | ICD-10-CM | POA: Diagnosis not present

## 2020-06-13 DIAGNOSIS — N184 Chronic kidney disease, stage 4 (severe): Secondary | ICD-10-CM | POA: Diagnosis not present

## 2020-06-13 DIAGNOSIS — R5381 Other malaise: Secondary | ICD-10-CM | POA: Diagnosis not present

## 2020-06-13 DIAGNOSIS — R410 Disorientation, unspecified: Secondary | ICD-10-CM | POA: Diagnosis not present

## 2020-06-13 DIAGNOSIS — E1165 Type 2 diabetes mellitus with hyperglycemia: Secondary | ICD-10-CM | POA: Diagnosis not present

## 2020-06-13 DIAGNOSIS — R41841 Cognitive communication deficit: Secondary | ICD-10-CM | POA: Diagnosis not present

## 2020-06-13 DIAGNOSIS — I7789 Other specified disorders of arteries and arterioles: Secondary | ICD-10-CM | POA: Diagnosis not present

## 2020-06-13 DIAGNOSIS — M7989 Other specified soft tissue disorders: Secondary | ICD-10-CM | POA: Diagnosis not present

## 2020-06-13 DIAGNOSIS — I6389 Other cerebral infarction: Secondary | ICD-10-CM | POA: Diagnosis not present

## 2020-06-13 DIAGNOSIS — H34231 Retinal artery branch occlusion, right eye: Secondary | ICD-10-CM | POA: Diagnosis not present

## 2020-06-13 DIAGNOSIS — R0989 Other specified symptoms and signs involving the circulatory and respiratory systems: Secondary | ICD-10-CM | POA: Diagnosis not present

## 2020-06-13 DIAGNOSIS — R310 Gross hematuria: Secondary | ICD-10-CM | POA: Diagnosis not present

## 2020-06-13 DIAGNOSIS — Z7401 Bed confinement status: Secondary | ICD-10-CM | POA: Diagnosis not present

## 2020-06-13 DIAGNOSIS — I69398 Other sequelae of cerebral infarction: Secondary | ICD-10-CM | POA: Diagnosis not present

## 2020-06-13 DIAGNOSIS — M6281 Muscle weakness (generalized): Secondary | ICD-10-CM | POA: Diagnosis not present

## 2020-06-13 DIAGNOSIS — L309 Dermatitis, unspecified: Secondary | ICD-10-CM | POA: Diagnosis not present

## 2020-06-13 DIAGNOSIS — I6621 Occlusion and stenosis of right posterior cerebral artery: Secondary | ICD-10-CM | POA: Diagnosis not present

## 2020-06-13 DIAGNOSIS — H401134 Primary open-angle glaucoma, bilateral, indeterminate stage: Secondary | ICD-10-CM | POA: Diagnosis not present

## 2020-06-13 DIAGNOSIS — I63311 Cerebral infarction due to thrombosis of right middle cerebral artery: Secondary | ICD-10-CM | POA: Diagnosis not present

## 2020-06-13 DIAGNOSIS — E7800 Pure hypercholesterolemia, unspecified: Secondary | ICD-10-CM | POA: Diagnosis present

## 2020-06-13 DIAGNOSIS — E1322 Other specified diabetes mellitus with diabetic chronic kidney disease: Secondary | ICD-10-CM | POA: Diagnosis not present

## 2020-06-13 DIAGNOSIS — E781 Pure hyperglyceridemia: Secondary | ICD-10-CM | POA: Diagnosis not present

## 2020-06-13 DIAGNOSIS — E118 Type 2 diabetes mellitus with unspecified complications: Secondary | ICD-10-CM | POA: Diagnosis not present

## 2020-06-13 DIAGNOSIS — E860 Dehydration: Secondary | ICD-10-CM | POA: Diagnosis not present

## 2020-06-13 DIAGNOSIS — E1149 Type 2 diabetes mellitus with other diabetic neurological complication: Secondary | ICD-10-CM | POA: Diagnosis not present

## 2020-06-13 DIAGNOSIS — Z9181 History of falling: Secondary | ICD-10-CM | POA: Diagnosis not present

## 2020-06-13 DIAGNOSIS — R262 Difficulty in walking, not elsewhere classified: Secondary | ICD-10-CM | POA: Diagnosis not present

## 2020-06-13 DIAGNOSIS — I63239 Cerebral infarction due to unspecified occlusion or stenosis of unspecified carotid arteries: Secondary | ICD-10-CM | POA: Diagnosis not present

## 2020-06-13 DIAGNOSIS — N471 Phimosis: Secondary | ICD-10-CM | POA: Diagnosis not present

## 2020-06-13 DIAGNOSIS — H548 Legal blindness, as defined in USA: Secondary | ICD-10-CM | POA: Diagnosis present

## 2020-06-13 DIAGNOSIS — R600 Localized edema: Secondary | ICD-10-CM | POA: Diagnosis not present

## 2020-06-13 DIAGNOSIS — E119 Type 2 diabetes mellitus without complications: Secondary | ICD-10-CM | POA: Diagnosis not present

## 2020-06-13 DIAGNOSIS — K5901 Slow transit constipation: Secondary | ICD-10-CM | POA: Diagnosis not present

## 2020-06-13 LAB — GLUCOSE, CAPILLARY
Glucose-Capillary: 257 mg/dL — ABNORMAL HIGH (ref 70–99)
Glucose-Capillary: 251 mg/dL — ABNORMAL HIGH (ref 70–99)

## 2020-06-13 LAB — CBC
HCT: 47.4 % (ref 39.0–52.0)
Hemoglobin: 15.3 g/dL (ref 13.0–17.0)
MCH: 29.3 pg (ref 26.0–34.0)
MCHC: 32.3 g/dL (ref 30.0–36.0)
MCV: 90.8 fL (ref 80.0–100.0)
Platelets: 175 10*3/uL (ref 150–400)
RBC: 5.22 MIL/uL (ref 4.22–5.81)
RDW: 13.5 % (ref 11.5–15.5)
WBC: 11.9 10*3/uL — ABNORMAL HIGH (ref 4.0–10.5)
nRBC: 0 % (ref 0.0–0.2)

## 2020-06-13 LAB — BASIC METABOLIC PANEL
Anion gap: 10 (ref 5–15)
BUN: 37 mg/dL — ABNORMAL HIGH (ref 8–23)
CO2: 24 mmol/L (ref 22–32)
Calcium: 9.6 mg/dL (ref 8.9–10.3)
Chloride: 101 mmol/L (ref 98–111)
Creatinine, Ser: 2.02 mg/dL — ABNORMAL HIGH (ref 0.61–1.24)
GFR, Estimated: 32 mL/min — ABNORMAL LOW (ref >60)
Glucose, Bld: 263 mg/dL — ABNORMAL HIGH (ref 70–99)
Potassium: 4.8 mmol/L (ref 3.5–5.1)
Sodium: 135 mmol/L (ref 135–145)

## 2020-06-13 MED ORDER — INSULIN ASPART 100 UNIT/ML ~~LOC~~ SOLN
4.0000 [IU] | Freq: Once | SUBCUTANEOUS | Status: AC
  Administered 2020-06-13: 4 [IU] via SUBCUTANEOUS

## 2020-06-13 MED ORDER — CLOPIDOGREL BISULFATE 75 MG PO TABS
75.0000 mg | ORAL_TABLET | Freq: Every day | ORAL | Status: DC
Start: 2020-06-13 — End: 2020-07-20

## 2020-06-13 MED ORDER — METOPROLOL SUCCINATE ER 25 MG PO TB24: 12.5000 mg | ORAL_TABLET | Freq: Every day | ORAL | 11 refills | Status: DC

## 2020-06-13 MED ORDER — APIXABAN 2.5 MG PO TABS
2.5000 mg | ORAL_TABLET | Freq: Two times a day (BID) | ORAL | Status: DC
  Administered 2020-06-13: 2.5 mg via ORAL
  Filled 2020-06-13: qty 1

## 2020-06-13 MED ORDER — ASPIRIN 81 MG PO TBEC
81.0000 mg | DELAYED_RELEASE_TABLET | Freq: Every day | ORAL | 11 refills | Status: DC
Start: 2020-06-13 — End: 2022-12-02

## 2020-06-13 MED FILL — METOPROLOL SUCCINATE ER 25: 25 | 30 days supply | Qty: 15 | Fill #0

## 2020-06-13 NOTE — Progress Notes (Addendum)
Progress Note    06/13/2020 7:26 AM 1 Day Post-Op  Subjective: He denies pain this morning.  He states he sat up in the chair yesterday without dizziness, shortness of breath or pain.  He is voiding spontaneously Brief episode of sinus bradycardia overnight>stable without symptoms  Vitals:   06/12/20 2356 06/13/20 0300  BP: 112/71 117/60  Pulse:  (!) 57  Resp:    Temp: 98.4 F (36.9 C) 98 F (36.7 C)  SpO2:     HR average 40s; sys BP 110s-120s Physical Exam: Cardiac: Rate and rhythm are regular Lungs: Clear to auscultation bilaterally Incisions: Right neck incision is well approximated and soft to palpation.  No hematoma Extremities: Moves all extremities well.  5 out of 5 bilateral hand grip strength Abdomen: Left groin venipuncture site without bleeding or hematoma Neurologic: He is alert and oriented x4.  Speech is clear.  Face is symmetrical.  Tongue is midline  CBC    Component Value Date/Time   WBC 11.9 (H) 06/13/2020 0340   RBC 5.22 06/13/2020 0340   HGB 15.3 06/13/2020 0340   HCT 47.4 06/13/2020 0340   PLT 175 06/13/2020 0340   MCV 90.8 06/13/2020 0340   MCH 29.3 06/13/2020 0340   MCHC 32.3 06/13/2020 0340   RDW 13.5 06/13/2020 0340   LYMPHSABS 1.9 06/09/2020 1315   MONOABS 0.7 06/09/2020 1315   EOSABS 0.4 06/09/2020 1315   BASOSABS 0.1 06/09/2020 1315    BMET    Component Value Date/Time   NA 135 06/13/2020 0340   K 4.8 06/13/2020 0340   CL 101 06/13/2020 0340   CO2 24 06/13/2020 0340   GLUCOSE 263 (H) 06/13/2020 0340   BUN 37 (H) 06/13/2020 0340   CREATININE 2.02 (H) 06/13/2020 0340   CALCIUM 9.6 06/13/2020 0340   GFRNONAA 32 (L) 06/13/2020 0340   GFRAA 34 (L) 12/25/2018 0627     Intake/Output Summary (Last 24 hours) at 06/13/2020 0726 Last data filed at 06/13/2020 0300 Gross per 24 hour  Intake 1050 ml  Output 325 ml  Net 725 ml    HOSPITAL MEDICATIONS Scheduled Meds: . aspirin EC  81 mg Oral Daily  . atorvastatin  40 mg Oral QHS   . cholecalciferol  2,000 Units Oral Daily  . clopidogrel  75 mg Oral Daily  . docusate sodium  100 mg Oral QHS  . gabapentin  200 mg Oral QHS  . insulin aspart  0-9 Units Subcutaneous TID WC  . latanoprost  1 drop Both Eyes QHS  . loratadine  10 mg Oral Daily  . magnesium oxide  400 mg Oral Daily  . melatonin  5 mg Oral QHS  . metoprolol succinate  25 mg Oral Daily  . pantoprazole  40 mg Oral Daily  . polyethylene glycol  17 g Oral Daily  . polyvinyl alcohol  1 drop Both Eyes QID  . tamsulosin  0.4 mg Oral QPC supper  . vitamin B-12  1,000 mcg Oral Daily   Continuous Infusions: . sodium chloride    . sodium chloride    . magnesium sulfate bolus IVPB     PRN Meds:.sodium chloride, acetaminophen **OR** acetaminophen (TYLENOL) oral liquid 160 mg/5 mL **OR** acetaminophen, albuterol, alum & mag hydroxide-simeth, guaiFENesin-dextromethorphan, hydrALAZINE, labetalol, magnesium sulfate bolus IVPB, metoprolol tartrate, nitroGLYCERIN, ondansetron, oxybutynin, phenol, potassium chloride, senna-docusate  Assessment:     Plan: 84 yo with symptomatic right carotid artery stenosis s/p right TCAR: POD 1 -Hemodynamically stable and neurologically intact.  Hemoglobin is normal.  Scr is up slightly to 2.02. UOP 300cc last 12 hours.  -Continue aspirin, statin and Plavix.  Okay for discharge to nursing home.  We will make arrangements for follow-up with Dr. Trula Slade in 4 weeks with carotid duplex  -DVT prophylaxis:  SCDs   Risa Grill, PA-C [facility] [phone] 06/13/2020  7:26 AM

## 2020-06-13 NOTE — Progress Notes (Signed)
Blood glucose only went down to 315. Informed the on call Hospitalist who ordered a one time dose of 4 Unit Novolog insulin subcutaneous.  Lupita Dawn, RN

## 2020-06-13 NOTE — Progress Notes (Signed)
Pt discharged today to [facility] via [facility].  Pt taken off telemetry and CCMD notified.  Pt's IV removed.  Pt left with all of their personal belongings.  AVS and stroke education reviewed with Pt and AVS sent with PTAR service.  Report given to Bufford Lope RN at [facility].

## 2020-06-13 NOTE — TOC Transition Note (Signed)
Transition of Care [facility]) - CM/SW Discharge Note   Patient Details  Name: Reginald [name] MRN: [id number] Date of Birth: 01-17-1936  Transition of Care [facility]) CM/SW Contact:  Bary Castilla, LCSW Phone Number: [phone] 06/13/2020, 11:18 AM   Clinical Narrative:    Patient will DC to:?[facility] Anticipated DC date:?06/13/20 Family notified:?[facility] [id number]   Per MD patient ready for DC to [facility]. RN, patient, patient's family, and facility notified of DC. Discharge Summary sent to facility. RN given number for report  [phone] ask for [facility]. DC packet on chart. Ambulance transport requested for patient.   CSW signing off.   [place]ery [place], [place] [phone]    Final next level of care: Skilled Nursing Facility Barriers to Discharge: Barriers Resolved   Patient Goals and CMS Choice Patient states their goals for this hospitalization and ongoing recovery are:: To feel better      Discharge Placement              Patient chooses bed at: [facility] Patient to be transferred to facility by: [facility] Name of family member notified: [name] Patient and family notified of of transfer: 06/13/20  Discharge Plan and Services In-house Referral: Clinical Social Work Discharge Planning Services: CM Consult Post Acute Care Choice: Center          DME Arranged: N/A DME Agency: NA                  Social Determinants of Health (SDOH) Interventions     Readmission Risk Interventions No flowsheet data found.

## 2020-06-13 NOTE — Discharge Summary (Addendum)
Physician Discharge Summary  KALLIN HENK TKZ:[id number] DOB: Feb 21, 1936 DOA: 06/09/2020  PCP: Patient, No Pcp Per  Admit date: 06/09/2020 Discharge date: 06/13/2020  Admitted From: SNF Disposition: snf  Recommendations for Outpatient Follow-up:  1. Follow up with PCP in 1-2 weeks 2. Please obtain BMP/CBC in one week Please follow up with vascular surgery as recommended.  Please follow up with neurology in 2 to 4 weeks.   Discharge Condition:stable. CODE STATUS:full code.  Diet recommendation: Heart Healthy / Carb Modified    Brief/Interim Summary:  84 year old gentleman prior history of COPD, coronary artery disease, hypertension, hyperlipidemia, obstructive sleep apnea, diabetes, atrial fibrillation on anticoagulation, presents to ED for evaluation of painless vision loss in the right eye since Sunday afternoon.  Patient denies any other neurological deficits at this time.  MRI of the brain and orbit show a probable subacute infarct in the right precentral gyrus versus infarct.  Neurology recommended vascular surgery consultation for revascularization of the right carotid.  Vascular surgery recommended a right TCAR scheduled on 06/12/20, pt underwent the procedure and stable to be discharged backto SNF.   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 He was loaded on Plavix and currently on 75 mg of Plavix daily. He will be discharged on aspirin 81 mg, plavix 75 mg daily and eliquis. Confirmed with Dr Trula Slade.   Echocardiogram shows Left ventricular ejection fraction, by estimation, is 55 to 60%. The  left ventricle has normal function. The left ventricle has no regional  wall motion abnormalities. There is moderate left ventricular hypertrophy.  Left ventricular diastolic  parameters are indeterminate.  nonew complaints.   History of coronary artery disease Patient currently denies any chest pain, continue with aspirin, Plavix. eliquis to be started on discharge.   Obstructive sleep apnea continue with CPAP at this time.   Atrial fibrillation Rate controlled, eliquis to be started on discharge.  Will decrease the dose of metoprolol to 12.5 mg daily due to bradycardia episodes, and pt has been asymptomatic.    Stage IV CKD Creatinine slightly up from baseline, recommend holding spironolactone for a few days and restart it once creatinine is back to baseline.    Chronic diastolic heart failure Patient appears to be compensated at this time.  He denies any shortness of breath or worsening leg edema this time.    Type 2 diabetes mellitus resume home meds on discharge . Hold metformin for stage 4 CKD.  Continue with sliding scale insulin. No changes in meds.    Diabetic neuropathy Continue with gabapentin 200 mg daily at bedtime.   Constipation Continue with Senokot and MiraLAX.  Chronic venous insufficiency leading to dermatitis of lower extremities.  Body mass index is 43.31 kg/m. Morbid obesity Recommend outpatient follow-up with PCP for weight loss.  Hyperlipidemia Continue with Lipitor.    COPD No  wheezing heard on exam today   History of COVID-19 infection Patient currently denies any chest pain or shortness of breath at this  time.    Discharge Instructions  Discharge Instructions    Ambulatory referral to Neurology   Complete by: As directed    Follow up with stroke clinic NP (Jessica Vanschaick or Cecille Rubin, if both not available, consider Zachery Dauer, or A[facility]) at [facility] in about 4 weeks. Thanks.   Diet - low sodium heart healthy   Complete by: As directed    Discharge instructions   Complete by: As directed    PLEASE follow up with vascular surgery as recommended.   Increase activity slowly   Complete by: As directed    No wound care   Complete by: As directed      Allergies as of 06/13/2020      Reactions   Adhesive [tape] Rash      Medication List    STOP taking these medications   metFORMIN 750 MG 24 hr tablet Commonly known as: GLUCOPHAGE-XR   spironolactone 25 MG tablet Commonly known as: ALDACTONE     TAKE these medications   acetaminophen 325 MG tablet Commonly known as: TYLENOL Take 650 mg by mouth every 4 (four) hours as needed for moderate pain.   albuterol 108 (90 Base) MCG/ACT inhaler Commonly known as: VENTOLIN HFA Inhale 2 puffs into the lungs 2 (two) times daily as needed for wheezing or shortness of breath.   aspirin 81 MG EC tablet Take 1 tablet (81 mg total) by mouth daily. Swallow whole.   atorvastatin 40 MG tablet Commonly known as: LIPITOR Take 40 mg by mouth at bedtime.   Biofreeze 4 % Gel Generic drug: Menthol (Topical Analgesic) Apply 1 application topically 2 (two) times daily as needed (forehead and left arm pain).   cetirizine 10 MG tablet Commonly known as: ZYRTEC Take 5 mg by mouth at bedtime.   cholecalciferol 25 MCG (1000 UNIT) tablet Commonly known as: VITAMIN D Take 2,000 Units by mouth daily.   clopidogrel 75 MG tablet Commonly known as: PLAVIX Take 1 tablet (75 mg total) by mouth daily.   docusate sodium 100 MG capsule Commonly known as: COLACE Take 100 mg by mouth at bedtime.   Eliquis 2.5 MG Tabs tablet Generic drug:  apixaban Take 2.5 mg by mouth 2 (two) times daily.   eucerin cream Apply 1 application topically 2 (two) times a day. For itching   gabapentin 100 MG capsule Commonly known as: NEURONTIN Take 200 mg by mouth at bedtime.   hydroxypropyl methylcellulose / hypromellose 2.5 % ophthalmic solution Commonly known as: ISOPTO TEARS / GONIOVISC Place 1 drop into both eyes 4 (four) times daily.   insulin lispro 100 UNIT/ML injection Commonly known as: HUMALOG Inject 0-14 Units into the skin See admin instructions. Inject 0-14 units subcutaneously twice daily per sliding scale: CBG 0-200 0 units, 201-250 2 units, 251-300 4 units, 301-350 6 units, 351-400 8 units, 401-450 10 units, 451-500 12 units, >500 14 units If blood sugar is > 400 or < 80 call pec triage   latanoprost 0.005 % ophthalmic solution Commonly known as: XALATAN Place 1 drop into both eyes at bedtime.   magnesium oxide 400 MG tablet Commonly known as: MAG-OX Take 400 mg by mouth daily.   melatonin 5 MG Tabs Take 5 mg by mouth at bedtime.   metoprolol succinate 25 MG 24 hr tablet Commonly known as: Toprol XL Take 0.5 tablets (12.5 mg  total) by mouth daily. Start taking on: June 14, 2020 What changed: how much to take   nitroGLYCERIN 0.4 MG SL tablet Commonly known as: NITROSTAT Place 0.4 mg under the tongue every 5 (five) minutes as needed for chest pain.   oxybutynin 5 MG tablet Commonly known as: DITROPAN Take 5 mg by mouth every 8 (eight) hours as needed for bladder spasms.   polyethylene glycol 17 g packet Commonly known as: MIRALAX / GLYCOLAX Take 17 g by mouth daily. Mix in 6 oz fluid and drink   tamsulosin 0.4 MG Caps capsule Commonly known as: FLOMAX Take 1 capsule (0.4 mg total) by mouth daily after supper.   Trulicity 3 YF/7.4BS Sopn Generic drug: Dulaglutide Inject 3 mg into the skin once a week. Mondays   vitamin B-12 1000 MCG tablet Commonly known as: CYANOCOBALAMIN Take 1,000 mcg by mouth  daily.       Follow-up Information    [facility]. Schedule an appointment as soon as possible for a visit in 4 week(s).   Specialty: Neurology Contact information: [address] [place] [phone]       Serafina Mitchell, MD Follow up in 4 week(s).   Specialties: Vascular Surgery, Cardiology Why: Our office will call and make arrangments for follow-up Contact information: [address] [place] [address] [phone]              Allergies  Allergen Reactions  . Adhesive [Tape] Rash    Consultations:  Vascular surgery  Neurology.    Procedures/Studies: CT Angio Head W or Wo Contrast  Result Date: 06/09/2020 CLINICAL DATA:  Vision loss, monocular. Additional history provided: Additional history provided: Sudden onset right eye vision loss (symptoms for 2-3 days). EXAM: CT ANGIOGRAPHY HEAD AND NECK TECHNIQUE: Multidetector CT imaging of the head and neck was performed using the standard protocol during bolus administration of intravenous contrast. Multiplanar CT image reconstructions and MIPs were obtained to evaluate the vascular anatomy. Carotid stenosis measurements (when applicable) are obtained utilizing NASCET criteria, using the distal internal carotid diameter as the denominator. CONTRAST:  39mL OMNIPAQUE IOHEXOL 350 MG/ML SOLN COMPARISON:  MRI of the brain and orbits performed earlier the same day 06/09/2020. CT angiogram head/neck 01/04/2017. FINDINGS: CT HEAD FINDINGS Brain: Moderate generalized cerebral atrophy. 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 Moderately advanced ill-defined hypoattenuation within the cerebral white matter is nonspecific, but compatible with chronic small vessel ischemic disease. 10 mm lobular focus of dural-based calcification projecting rightward from the posterior falx, nonspecific but possibly reflecting a small calcified meningioma (series 4, image 23).  There is no acute intracranial hemorrhage. No CT evidence of acute cortical infarct. A probable punctate subacute infarct within the right precentral gyrus was better appreciated on the same-day brain MRI. No extra-axial fluid collection. No evidence of intracranial mass. No midline shift. Vascular: No hyperdense vessel.  Atherosclerotic calcifications. Skull: Normal. Negative for fracture or focal lesion. Sinuses: Trace ethmoid, right sphenoid and right maxillary sinus mucosal thickening. Orbits: No mass or acute finding. Other: 2.1 cm left parietal scalp lesion. Review of the MIP images confirms the above findings CTA NECK FINDINGS Aortic arch: Standard aortic branching. Atherosclerotic plaque within the visualized aortic arch and proximal major branch vessels of the neck. No hemodynamically significant innominate or proximal subclavian artery stenosis. Right carotid system: CCA and ICA patent within the neck. Prominent soft and calcified plaque within the carotid bifurcation and proximal ICA. Resultant stenosis of the proximal ICA  of up to 70% (series 12, images 148 and 149). Distal to this, the ICA is patent within the neck without stenosis. Partially retropharyngeal course of the ICA Left carotid system: CCA and ICA patent within the neck without significant stenosis (50% or greater). Mild calcified plaque within the carotid bifurcation and proximal ICA. Vertebral arteries: Codominant and patent within the neck bilaterally. Streak and beam hardening artifact arising from the patient's shoulders limits evaluation. However, there is apparent moderate/severe stenosis at the origin of both vertebral arteries. Skeleton: No acute bony abnormality or aggressive osseous lesion. 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 As before, prominent ossification of the posterior longitudinal ligament contributes to suspected at least moderate spinal canal stenosis at C2-C3, C3-C4  and C4-C5. Other neck: No neck mass or cervical lymphadenopathy. Upper chest: No consolidation within the imaged lung apices. The visualized intrathoracic esophagus is somewhat patulous. Review of the MIP images confirms the above findings CTA HEAD FINDINGS Anterior circulation: The intracranial internal carotid arteries are patent. Calcified plaque within both vessels. No more than mild stenosis on the right. Moderate stenosis of the left paraclinoid segment. The M1 middle cerebral arteries are patent. Moderate stenosis of the proximal M1 left MCA (series 15, image 50). Mild-to-moderate stenosis of the distal M1 left MCA. No M2 proximal branch occlusion or high-grade proximal stenosis. Atherosclerotic irregularity of the M2 and more distal MCA branches bilaterally. Most notably, there is a high-grade stenosis within a superior division proximal M2 left MCA branch vessel (series 17, image 29). The anterior cerebral arteries are patent. Atherosclerotic irregularity of the bilateral anterior cerebral arteries. Most notably, there is mild-to-moderate atherosclerotic irregularity of both A2 segments and a high-grade focal stenosis within the A4 right ACA (series 7, image 21). No intracranial aneurysm is identified. Posterior circulation: The intracranial vertebral arteries are patent. The basilar artery is patent. The posterior cerebral arteries are patent. Atherosclerotic irregularity of both posterior cerebral arteries. Most notably, there is a high-grade stenosis within the P2 right PCA (series 15, image 50) and a moderate/severe stenosis within a left PCA branch at the P2/P3 junction (series 17, image 25). A small left posterior communicating artery is present. The right posterior communicating artery is hypoplastic or absent. Venous sinuses: Within the limitations of contrast timing, no convincing thrombus. Anatomic variants: As described Review of the MIP images confirms the above findings IMPRESSION: CT head: 1.  No CT evidence of acute intracranial abnormality. A probable punctate subacute infarct within the right precentral gyrus was better appreciated on the same-day brain MRI. 2. 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 3. Moderate cerebral atrophy and moderately advanced chronic small vessel ischemic disease. 4. 10 mm rounded focus of dural-based calcification projecting rightward from the posterior falx, nonspecific but possibly reflecting a small incidental meningioma. 5. 2.1 cm left parietal scalp lesion. Direct visualization recommended CTA neck: 1. The common and internal carotid arteries are patent within the neck. Prominent soft and calcified plaque within the right carotid bifurcation and proximal ICA with resultant stenosis of the proximal right ICA of up to 70% (progressed as compared to the prior CTA of 01/04/2017). 2. Vertebral arteries patent within the neck. Apparent moderate/severe stenoses at both vertebral artery origins. CTA head: 1. No intracranial large vessel occlusion. 2. Intracranial atherosclerotic disease with multifocal stenoses, most notably as follows (and not significantly changed as compared to the CTA of 02/03/2017). 3. Moderate stenosis of the paraclinoid left ICA. 4. Moderate stenosis of the proximal M1  left MCA. 5. High-grade focal stenosis within the A4 right anterior cerebral artery. 6. High-grade stenosis within the P2 right PCA. 7. Moderate/severe focal stenosis within the left PCA at the P2/P3 junction. Electronically Signed   By: Kellie Simmering DO   On: 06/09/2020 16:38   CT Angio Neck W and/or Wo Contrast  Result Date: 06/09/2020 CLINICAL DATA:  Vision loss, monocular. Additional history provided: Additional history provided: Sudden onset right eye vision loss (symptoms for 2-3 days). EXAM: CT ANGIOGRAPHY HEAD AND NECK TECHNIQUE: Multidetector CT imaging of the head and neck was performed using the standard protocol during bolus administration  of intravenous contrast. Multiplanar CT image reconstructions and MIPs were obtained to evaluate the vascular anatomy. Carotid stenosis measurements (when applicable) are obtained utilizing NASCET criteria, using the distal internal carotid diameter as the denominator. CONTRAST:  36mL OMNIPAQUE IOHEXOL 350 MG/ML SOLN COMPARISON:  MRI of the brain and orbits performed earlier the same day 06/09/2020. CT angiogram head/neck 01/04/2017. FINDINGS: CT HEAD FINDINGS Brain: Moderate generalized cerebral atrophy. 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 Moderately advanced ill-defined hypoattenuation within the cerebral white matter is nonspecific, but compatible with chronic small vessel ischemic disease. 10 mm lobular focus of dural-based calcification projecting rightward from the posterior falx, nonspecific but possibly reflecting a small calcified meningioma (series 4, image 23). There is no acute intracranial hemorrhage. No CT evidence of acute cortical infarct. A probable punctate subacute infarct within the right precentral gyrus was better appreciated on the same-day brain MRI. No extra-axial fluid collection. No evidence of intracranial mass. No midline shift. Vascular: No hyperdense vessel.  Atherosclerotic calcifications. Skull: Normal. Negative for fracture or focal lesion. Sinuses: Trace ethmoid, right sphenoid and right maxillary sinus mucosal thickening. Orbits: No mass or acute finding. Other: 2.1 cm left parietal scalp lesion. Review of the MIP images confirms the above findings CTA NECK FINDINGS Aortic arch: Standard aortic branching. Atherosclerotic plaque within the visualized aortic arch and proximal major branch vessels of the neck. No hemodynamically significant innominate or proximal subclavian artery stenosis. Right carotid system: CCA and ICA patent within the neck. Prominent soft and calcified plaque within the carotid bifurcation and proximal ICA. Resultant stenosis of the  proximal ICA of up to 70% (series 12, images 148 and 149). Distal to this, the ICA is patent within the neck without stenosis. Partially retropharyngeal course of the ICA Left carotid system: CCA and ICA patent within the neck without significant stenosis (50% or greater). Mild calcified plaque within the carotid bifurcation and proximal ICA. Vertebral arteries: Codominant and patent within the neck bilaterally. Streak and beam hardening artifact arising from the patient's shoulders limits evaluation. However, there is apparent moderate/severe stenosis at the origin of both vertebral arteries. Skeleton: No acute bony abnormality or aggressive osseous lesion. 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 As before, prominent ossification of the posterior longitudinal ligament contributes to suspected at least moderate spinal canal stenosis at C2-C3, C3-C4 and C4-C5. Other neck: No neck mass or cervical lymphadenopathy. Upper chest: No consolidation within the imaged lung apices. The visualized intrathoracic esophagus is somewhat patulous. Review of the MIP images confirms the above findings CTA HEAD FINDINGS Anterior circulation: The intracranial internal carotid arteries are patent. Calcified plaque within both vessels. No more than mild stenosis on the right. Moderate stenosis of the left paraclinoid segment. The M1 middle cerebral arteries are patent. Moderate stenosis of the proximal M1 left MCA (series 15, image 50). Mild-to-moderate stenosis of the distal  M1 left MCA. No M2 proximal branch occlusion or high-grade proximal stenosis. Atherosclerotic irregularity of the M2 and more distal MCA branches bilaterally. Most notably, there is a high-grade stenosis within a superior division proximal M2 left MCA branch vessel (series 17, image 29). The anterior cerebral arteries are patent. Atherosclerotic irregularity of the bilateral anterior cerebral arteries. Most notably,  there is mild-to-moderate atherosclerotic irregularity of both A2 segments and a high-grade focal stenosis within the A4 right ACA (series 7, image 21). No intracranial aneurysm is identified. Posterior circulation: The intracranial vertebral arteries are patent. The basilar artery is patent. The posterior cerebral arteries are patent. Atherosclerotic irregularity of both posterior cerebral arteries. Most notably, there is a high-grade stenosis within the P2 right PCA (series 15, image 50) and a moderate/severe stenosis within a left PCA branch at the P2/P3 junction (series 17, image 25). A small left posterior communicating artery is present. The right posterior communicating artery is hypoplastic or absent. Venous sinuses: Within the limitations of contrast timing, no convincing thrombus. Anatomic variants: As described Review of the MIP images confirms the above findings IMPRESSION: CT head: 1. No CT evidence of acute intracranial abnormality. A probable punctate subacute infarct within the right precentral gyrus was better appreciated on the same-day brain MRI. 2. 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 3. Moderate cerebral atrophy and moderately advanced chronic small vessel ischemic disease. 4. 10 mm rounded focus of dural-based calcification projecting rightward from the posterior falx, nonspecific but possibly reflecting a small incidental meningioma. 5. 2.1 cm left parietal scalp lesion. Direct visualization recommended CTA neck: 1. The common and internal carotid arteries are patent within the neck. Prominent soft and calcified plaque within the right carotid bifurcation and proximal ICA with resultant stenosis of the proximal right ICA of up to 70% (progressed as compared to the prior CTA of 01/04/2017). 2. Vertebral arteries patent within the neck. Apparent moderate/severe stenoses at both vertebral artery origins. CTA head: 1. No intracranial large vessel occlusion. 2.  Intracranial atherosclerotic disease with multifocal stenoses, most notably as follows (and not significantly changed as compared to the CTA of 02/03/2017). 3. Moderate stenosis of the paraclinoid left ICA. 4. Moderate stenosis of the proximal M1 left MCA. 5. High-grade focal stenosis within the A4 right anterior cerebral artery. 6. High-grade stenosis within the P2 right PCA. 7. Moderate/severe focal stenosis within the left PCA at the P2/P3 junction. Electronically Signed   By: Kellie Simmering DO   On: 06/09/2020 16:38   MR BRAIN WO CONTRAST  Result Date: 06/09/2020 CLINICAL DATA:  Right eye vision loss EXAM: MRI HEAD AND ORBITS WITHOUT CONTRAST TECHNIQUE: Multiplanar, multiecho pulse sequences of the brain and surrounding structures were obtained without intravenous contrast. Multiplanar, multiecho pulse sequences of the orbits and surrounding structures were obtained including fat saturation techniques, without intravenous contrast administration. COMPARISON:  2019 FINDINGS: MRI HEAD FINDINGS Brain: A punctate focus of diffusion hyperintensity with ADC isointensity is present along the right precentral gyrus. Prominence of the ventricles and sulci reflects generalized parenchymal volume loss similar to the prior study. There is a small chronic right occipital infarct. Other patchy and confluent areas of T2 hyperintensity in the supratentorial white matter nonspecific but probably reflect similar moderate chronic microvascular ischemic changes. There are chronic small vessel infarcts and/or prominent perivascular spaces of the right centrum semiovale and bilateral basal ganglia. Few small scattered foci of susceptibility hypointensity likely reflecting chronic microhemorrhages. More prominent susceptibility in the parasagittal right parietal region could also  reflect a calcified meningioma, noting possible extra-axial lesion along the falx on series 14, image 9. There is no intracranial mass or mass effect. No  extra-axial collection. Vascular: Major vessel flow voids at the skull base are preserved. Skull and upper cervical spine: Marrow signal is within normal limits. Other: Minimal patchy fluid opacification of the mastoids. New nonspecific abnormal signal along the high left frontoparietal scalp (series 13, image 17). MRI ORBITS FINDINGS Orbits: No proptosis. No intraorbital mass. Globes, extraocular muscles, and lacrimal glands are symmetric and unremarkable. Optic nerve sheath complexes appear unremarkable on this noncontrast study. Visualized sinuses: Mild mucosal thickening. Soft tissues: Unremarkable. IMPRESSION: Probable punctate subacute infarct of the right precentral gyrus. Small chronic right occipital infarct. Moderate chronic microvascular ischemic changes. Question of a possible small calcified meningioma along the right falx. New nonspecific abnormal signal along the high left frontoparietal scalp (series 13, image 17). Correlate for scalp lesion on exam. Electronically Signed   By: Macy Mis M.D.   On: 06/09/2020 15:21   ECHOCARDIOGRAM COMPLETE  Result Date: 06/10/2020    ECHOCARDIOGRAM REPORT   Patient Name:   KAWIKA BISCHOFF [facility] Date of Exam: 06/10/2020 Medical Rec #:  [id number]        Height:       71.0 in Accession #:    [id number]       Weight:       310.5 lb Date of Birth:  01-May-1936        BSA:          2.542 m Patient Age:    84 years         BP:           171/99 mmHg Patient Gender: M                HR:           53 bpm. Exam Location:  Inpatient Procedure: 2D Echo, 3D Echo, Color Doppler and Cardiac Doppler Indications:    TIA 435.9 / G45.9  History:        Patient has prior history of Echocardiogram examinations, most                 recent 12/20/2018. CHF, CAD and Previous Myocardial Infarction,                 Arrythmias:Atrial Fibrillation; Risk Factors:Non-Smoker,                 Hypertension, Diabetes, Dyslipidemia and Sleep Apnea. Chronic                 kidney disease.   Sonographer:    Darlina Sicilian RDCS Referring Phys: [id number] [facility]  Sonographer Comments: Suboptimal apical window. IMPRESSIONS  1. Left ventricular ejection fraction, by estimation, is 55 to 60%. The left ventricle has normal function. The left ventricle has no regional wall motion abnormalities. There is moderate left ventricular hypertrophy. Left ventricular diastolic parameters are indeterminate.  2. Right ventricular systolic function is normal. The right ventricular size is normal.  3. The mitral valve is myxomatous. No evidence of mitral valve regurgitation.  4. The aortic valve is calcified. There is mild thickening of the aortic valve. Aortic valve regurgitation is not visualized.  5. There is mild dilatation of the ascending aorta, measuring 38 mm. Comparison(s): A prior study was performed on 12/20/2018. No significant change from prior study. Difficult comparison of ascending aortic measurement, presently 38 mm. FINDINGS  Left Ventricle: Left ventricular ejection  fraction, by estimation, is 55 to 60%. The left ventricle has normal function. The left ventricle has no regional wall motion abnormalities. Definity contrast agent was given IV to delineate the left ventricular  endocardial borders. The left ventricular internal cavity size was normal in size. There is moderate left ventricular hypertrophy. Left ventricular diastolic parameters are indeterminate. Right Ventricle: The right ventricular size is normal. Right vetricular wall thickness was not well visualized. Right ventricular systolic function is normal. Left Atrium: Left atrial size was normal in size. Right Atrium: Right atrial size was normal in size. Pericardium: There is no evidence of pericardial effusion. Mitral Valve: The mitral valve is myxomatous. There is mild thickening of the mitral valve leaflet(s). Mild mitral annular calcification. No evidence of mitral valve regurgitation. Tricuspid Valve: The tricuspid valve is grossly normal.  Tricuspid valve regurgitation is not demonstrated. Aortic Valve: The aortic valve is calcified. There is mild thickening of the aortic valve. Aortic valve regurgitation is not visualized. Pulmonic Valve: The pulmonic valve was not well visualized. Pulmonic valve regurgitation is not visualized. Aorta: Indexed 2.1; low risk assessment. The aortic root is normal in size and structure. There is mild dilatation of the ascending aorta, measuring 38 mm. IAS/Shunts: The interatrial septum was not well visualized.  LEFT VENTRICLE PLAX 2D LVIDd:         4.70 cm LVIDs:         3.90 cm LV PW:         0.90 cm LV IVS:        1.80 cm  3D Volume EF LVOT diam:     2.10 cm  LV 3D EF:    55.60 % LV SV:         40       LV 3D EDV:   121500.00 mm LV SV Index:   16       LV 3D ESV:   53900.00 mm LVOT Area:     3.46 cm LV 3D SV:    67600.00 mm  LEFT ATRIUM             Index LA diam:        3.90 cm 1.53 cm/m LA Vol (A2C):   43.3 ml 17.03 ml/m LA Vol (A4C):   46.7 ml 18.37 ml/m LA Biplane Vol: 47.5 ml 18.69 ml/m  AORTIC VALVE LVOT Vmax:   66.70 cm/s LVOT Vmean:  40.900 cm/s LVOT VTI:    0.115 m  AORTA Ao Root diam: 3.10 cm Ao Asc diam:  3.80 cm MITRAL VALVE MV Area (PHT): 3.65 cm    SHUNTS MV Decel Time: 208 msec    Systemic VTI:  0.12 m MV E velocity: 87.33 cm/s  Systemic Diam: 2.10 cm Rudean Haskell MD Electronically signed by Rudean Haskell MD Signature Date/Time: 06/10/2020/3:42:07 PM    Final    OCT, Retina - OU - Both Eyes  Result Date: 05/30/2020 Right Eye Quality was good. Central Foveal Thickness: 258. Progression has been stable. Findings include normal foveal contour, no IRF, no SRF (Mildly irregular lamination; trace ERM; diffuse retinal thinning). Left Eye Quality was good. Central Foveal Thickness: 238. Progression has been stable. Findings include normal foveal contour, no IRF, no SRF. Notes *Images captured and stored on drive Diagnosis / Impression: NFP, no IRF/SRF OU OD: Mildly irregular  lamination; trace ERM, diffuse retinal thinning Clinical management: See below Abbreviations: NFP - Normal foveal profile. CME - cystoid macular edema. PED - pigment epithelial detachment. IRF - intraretinal fluid.  SRF - subretinal fluid. EZ - ellipsoid zone. ERM - epiretinal membrane. ORA - outer retinal atrophy. ORT - outer retinal tubulation. SRHM - subretinal hyper-reflective material   Fluorescein Angiography Optos (Transit OD)  Result Date: 05/30/2020 Right Eye Progression has been stable. Early phase findings include microaneurysm, vascular perfusion defect, staining, delayed filling (Delayed venous return; telangiectatic vessels superior macula). Mid/Late phase findings include microaneurysm, vascular perfusion defect, leakage, staining (Mild Perivascular leakage superior macula; old BRAO+BRVO). Left Eye Progression has been stable. Early phase findings include microaneurysm, normal observations. Mid/Late phase findings include microaneurysm, normal observations. Notes Images stored on drive; Impression: OD: remote superior BRVO+BRAO; mild late perivascular leakage superior macula -- stable from prior OS: peripheral staining otherwise normal study  Structural Heart Procedure  Result Date: 06/12/2020 See surgical note for result.  HYBRID OR IMAGING ([facility] ONLY)  Result Date: 06/12/2020 There is no interpretation for this exam.  This order is for images obtained during a surgical procedure.  Please See "Surgeries" Tab for more information regarding the procedure.   MR ORBITS WO CONTRAST  Result Date: 06/09/2020 CLINICAL DATA:  Right eye vision loss EXAM: MRI HEAD AND ORBITS WITHOUT CONTRAST TECHNIQUE: Multiplanar, multiecho pulse sequences of the brain and surrounding structures were obtained without intravenous contrast. Multiplanar, multiecho pulse sequences of the orbits and surrounding structures were obtained including fat saturation techniques, without intravenous contrast administration.  COMPARISON:  2019 FINDINGS: MRI HEAD FINDINGS Brain: A punctate focus of diffusion hyperintensity with ADC isointensity is present along the right precentral gyrus. Prominence of the ventricles and sulci reflects generalized parenchymal volume loss similar to the prior study. There is a small chronic right occipital infarct. Other patchy and confluent areas of T2 hyperintensity in the supratentorial white matter nonspecific but probably reflect similar moderate chronic microvascular ischemic changes. There are chronic small vessel infarcts and/or prominent perivascular spaces of the right centrum semiovale and bilateral basal ganglia. Few small scattered foci of susceptibility hypointensity likely reflecting chronic microhemorrhages. More prominent susceptibility in the parasagittal right parietal region could also reflect a calcified meningioma, noting possible extra-axial lesion along the falx on series 14, image 9. There is no intracranial mass or mass effect. No extra-axial collection. Vascular: Major vessel flow voids at the skull base are preserved. Skull and upper cervical spine: Marrow signal is within normal limits. Other: Minimal patchy fluid opacification of the mastoids. New nonspecific abnormal signal along the high left frontoparietal scalp (series 13, image 17). MRI ORBITS FINDINGS Orbits: No proptosis. No intraorbital mass. Globes, extraocular muscles, and lacrimal glands are symmetric and unremarkable. Optic nerve sheath complexes appear unremarkable on this noncontrast study. Visualized sinuses: Mild mucosal thickening. Soft tissues: Unremarkable. IMPRESSION: Probable punctate subacute infarct of the right precentral gyrus. Small chronic right occipital infarct. Moderate chronic microvascular ischemic changes. Question of a possible small calcified meningioma along the right falx. New nonspecific abnormal signal along the high left frontoparietal scalp (series 13, image 17). Correlate for scalp  lesion on exam. Electronically Signed   By: Macy Mis M.D.   On: 06/09/2020 15:21       Subjective: No new complaints.   Discharge Exam: Vitals:   06/13/20 0300 06/13/20 0741  BP: 117/60 (!) 119/50  Pulse: (!) 57 (!) 51  Resp:  19  Temp: 98 F (36.7 C) (!) 97.4 F (36.3 C)  SpO2:  95%   Vitals:   06/12/20 2215 06/12/20 2356 06/13/20 0300 06/13/20 0741  BP: 112/67 112/71 117/60 (!) 119/50  Pulse: (!) 57  (!)  57 (!) 51  Resp: (!) 22   19  Temp: 97.6 F (36.4 C) 98.4 F (36.9 C) 98 F (36.7 C) (!) 97.4 F (36.3 C)  TempSrc: Oral Oral Oral Oral  SpO2: 93%   95%  Weight:      Height:        General: Pt is alert, awake, not in acute distress Cardiovascular: Bradycardia, S1/S2 +, no rubs, no gallops Respiratory: CTA bilaterally, no wheezing, no rhonchi Abdominal: Soft, NT, ND, bowel sounds + Extremities: no edema, no cyanosis    The results of significant diagnostics from this hospitalization (including imaging, microbiology, ancillary and laboratory) are listed below for reference.     Microbiology: Recent Results (from the past 240 hour(s))  Resp Panel by RT-PCR (Flu A&B, Covid) Nasopharyngeal Swab     Status: None   Collection Time: 06/09/20  4:58 PM   Specimen: Nasopharyngeal Swab; Nasopharyngeal(NP) swabs in vial transport medium  Result Value Ref Range Status   SARS Coronavirus 2 by RT PCR NEGATIVE NEGATIVE Final    Comment: (NOTE) SARS-CoV-2 target nucleic acids are NOT DETECTED.  The SARS-CoV-2 RNA is generally detectable in upper respiratory specimens during the acute phase of infection. The lowest concentration of SARS-CoV-2 viral copies this assay can detect is 138 copies/mL. A negative result does not preclude SARS-Cov-2 infection and should not be used as the sole basis for treatment or other patient management decisions. A negative result may occur with  improper specimen collection/handling, submission of specimen other than nasopharyngeal  swab, presence of viral mutation(s) within the areas targeted by this assay, and inadequate number of viral copies(<138 copies/mL). A negative result must be combined with clinical observations, patient history, and epidemiological information. The expected result is Negative.  Fact Sheet for Patients:  EntrepreneurPulse.com.au  Fact Sheet for Healthcare Providers:  IncredibleEmployment.be  This test is no t yet approved or cleared by the [place] FDA and  has been authorized for detection and/or diagnosis of SARS-CoV-2 by FDA under an Emergency Use Authorization (EUA). This EUA will remain  in effect (meaning this test can be used) for the duration of the COVID-19 declaration under Section 564(b)(1) of the Act, 21 U.S.C.section 360bbb-3(b)(1), unless the authorization is terminated  or revoked sooner.       Influenza A by PCR NEGATIVE NEGATIVE Final   Influenza B by PCR NEGATIVE NEGATIVE Final    Comment: (NOTE) The Xpert Xpress SARS-CoV-2/FLU/RSV plus assay is intended as an aid in the diagnosis of influenza from Nasopharyngeal swab specimens and should not be used as a sole basis for treatment. Nasal washings and aspirates are unacceptable for Xpert Xpress SARS-CoV-2/FLU/RSV testing.  Fact Sheet for Patients: EntrepreneurPulse.com.au  Fact Sheet for Healthcare Providers: IncredibleEmployment.be  This test is not yet approved or cleared by the [place] FDA and has been authorized for detection and/or diagnosis of SARS-CoV-2 by FDA under an Emergency Use Authorization (EUA). This EUA will remain in effect (meaning this test can be used) for the duration of the COVID-19 declaration under Section 564(b)(1) of the Act, 21 U.S.C. section 360bbb-3(b)(1), unless the authorization is terminated or revoked.  Performed at [facility], [place] [address] [place], [place] [address]       Labs: BNP (last 3 results) No results for input(s): BNP in the last 8760 hours. Basic Metabolic Panel: Recent Labs  Lab 06/09/20 1315 06/11/20 0506 06/12/20 0207 06/13/20 0340  NA 138 139 139 135  K 4.4 4.0 4.1 4.8  CL 101 102 102 101  CO2 29 26 26 24   GLUCOSE 177* 164* 159* 263*  BUN 23 21 26* 37*  CREATININE 1.58* 1.66* 1.84* 2.02*  CALCIUM 9.6 9.8 10.3 9.6   Liver Function Tests: Recent Labs  Lab 06/09/20 1315  AST 28  ALT 32  ALKPHOS 60  BILITOT 1.0  PROT 6.6  ALBUMIN 3.8   No results for input(s): LIPASE, AMYLASE in the last 168 hours. No results for input(s): AMMONIA in the last 168 hours. CBC: Recent Labs  Lab 06/09/20 1315 06/12/20 0207 06/13/20 0340  WBC 8.3 9.2 11.9*  NEUTROABS 5.1  --   --   HGB 16.6 16.4 15.3  HCT 52.2* 48.6 47.4  MCV 93.4 89.5 90.8  PLT 177 168 175   Cardiac Enzymes: No results for input(s): CKTOTAL, CKMB, CKMBINDEX, TROPONINI in the last 168 hours. BNP: Invalid input(s): POCBNP CBG: Recent Labs  Lab 06/12/20 1700 06/12/20 2110 06/12/20 2147 06/12/20 2355 06/13/20 0606  GLUCAP 269* 307* 320* 315* 257*   D-Dimer No results for input(s): DDIMER in the last 72 hours. Hgb A1c No results for input(s): HGBA1C in the last 72 hours. Lipid Profile Recent Labs    06/11/20 0506  CHOL 124  HDL 26*  LDLCALC 51  TRIG 234*  CHOLHDL 4.8   Thyroid function studies No results for input(s): TSH, T4TOTAL, T3FREE, THYROIDAB in the last 72 hours.  Invalid input(s): FREET3 Anemia work up No results for input(s): VITAMINB12, FOLATE, FERRITIN, TIBC, IRON, RETICCTPCT in the last 72 hours. Urinalysis    Component Value Date/Time   COLORURINE YELLOW 05/03/2020 1803   APPEARANCEUR CLEAR 05/03/2020 1803   LABSPEC 1.009 05/03/2020 1803   PHURINE 6.0 05/03/2020 1803   GLUCOSEU NEGATIVE 05/03/2020 1803   HGBUR LARGE (A) 05/03/2020 1803   BILIRUBINUR NEGATIVE 05/03/2020 1803   KETONESUR NEGATIVE 05/03/2020 1803   PROTEINUR  NEGATIVE 05/03/2020 1803   UROBILINOGEN 1.0 05/13/2015 1549   NITRITE POSITIVE (A) 05/03/2020 1803   LEUKOCYTESUR MODERATE (A) 05/03/2020 1803   Sepsis Labs Invalid input(s): PROCALCITONIN,  WBC,  LACTICIDVEN Microbiology Recent Results (from the past 240 hour(s))  Resp Panel by RT-PCR (Flu A&B, Covid) Nasopharyngeal Swab     Status: None   Collection Time: 06/09/20  4:58 PM   Specimen: Nasopharyngeal Swab; Nasopharyngeal(NP) swabs in vial transport medium  Result Value Ref Range Status   SARS Coronavirus 2 by RT PCR NEGATIVE NEGATIVE Final    Comment: (NOTE) SARS-CoV-2 target nucleic acids are NOT DETECTED.  The SARS-CoV-2 RNA is generally detectable in upper respiratory specimens during the acute phase of infection. The lowest concentration of SARS-CoV-2 viral copies this assay can detect is 138 copies/mL. A negative result does not preclude SARS-Cov-2 infection and should not be used as the sole basis for treatment or other patient management decisions. A negative result may occur with  improper specimen collection/handling, submission of specimen other than nasopharyngeal swab, presence of viral mutation(s) within the areas targeted by this assay, and inadequate number of viral copies(<138 copies/mL). A negative result must be combined with clinical observations, patient history, and epidemiological information. The expected result is Negative.  Fact Sheet for Patients:  EntrepreneurPulse.com.au  Fact Sheet for Healthcare Providers:  IncredibleEmployment.be  This test is no t yet approved or cleared by the [place] FDA and  has been authorized for detection and/or diagnosis of SARS-CoV-2 by FDA under an Emergency Use Authorization (EUA). This EUA will remain  in effect (meaning this test can be  used) for the duration of the COVID-19 declaration under Section 564(b)(1) of the Act, 21 U.S.C.section 360bbb-3(b)(1), unless the  authorization is terminated  or revoked sooner.       Influenza A by PCR NEGATIVE NEGATIVE Final   Influenza B by PCR NEGATIVE NEGATIVE Final    Comment: (NOTE) The Xpert Xpress SARS-CoV-2/FLU/RSV plus assay is intended as an aid in the diagnosis of influenza from Nasopharyngeal swab specimens and should not be used as a sole basis for treatment. Nasal washings and aspirates are unacceptable for Xpert Xpress SARS-CoV-2/FLU/RSV testing.  Fact Sheet for Patients: EntrepreneurPulse.com.au  Fact Sheet for Healthcare Providers: IncredibleEmployment.be  This test is not yet approved or cleared by the [place] FDA and has been authorized for detection and/or diagnosis of SARS-CoV-2 by FDA under an Emergency Use Authorization (EUA). This EUA will remain in effect (meaning this test can be used) for the duration of the COVID-19 declaration under Section 564(b)(1) of the Act, 21 U.S.C. section 360bbb-3(b)(1), unless the authorization is terminated or revoked.  Performed at [facility], [place] [address] [place], [place] [address]      Time coordinating discharge: 35 minutes.   SIGNED:   Hosie Poisson, MD  [facility] 06/13/2020, 10:57 AM

## 2020-06-13 NOTE — Progress Notes (Signed)
Removed the A-line from the left wrist.  Pressured held for seven minutes.  No drainage seen.  Will continue to monitor.  Lupita Dawn, RN

## 2020-06-13 NOTE — Progress Notes (Signed)
Mobility Specialist: Progress Note   06/13/20 1213  Mobility  Activity Ambulated in room  Level of Assistance Minimal assist, patient does 75% or more  Assistive Device Front wheel walker  Distance Ambulated (ft) 28 ft  Mobility Response Tolerated well  Mobility performed by Mobility specialist  Bed Position Chair  $Mobility charge 1 Mobility   Pre-Mobility: 51 HR, 124/69 BP, 93% SpO2 During Mobility: 107 HR Post-Mobility: 76 HR, 134/65 BP, 96% SpO2  Pt walked to door from bed and back to chair to sit up for lunch. Pt required cues to not lean on the RW and for remaining proximal to the RW. Pt has call bell at his side, RN notified.   [facility] Day Mobility Specialist

## 2020-06-13 NOTE — Discharge Instructions (Signed)
   [facility]  Discharge Instructions   Carotid Endarterectomy (CEA)  Please refer to the following instructions for your post-procedure care. Your surgeon or physician assistant will discuss any changes with you.  Activity  You are encouraged to walk as much as you can. You can slowly return to normal activities but must avoid strenuous activity and heavy lifting until your doctor tell you it's OK. Avoid activities such as vacuuming or swinging a golf club. You can drive after one week if you are comfortable and you are no longer taking prescription pain medications. It is normal to feel tired for serval weeks after your surgery. It is also normal to have difficulty with sleep habits, eating, and bowel movements after surgery. These will go away with time.  Bathing/Showering  You may shower after you come home. Do not soak in a bathtub, hot tub, or swim until the incision heals completely.  Incision Care  Shower every day. Clean your incision with mild soap and water. Pat the area dry with a clean towel. You do not need a bandage unless otherwise instructed. Do not apply any ointments or creams to your incision. You may have skin glue on your incision. Do not peel it off. It will come off on its own in about one week. Your incision may feel thickened and raised for several weeks after your surgery. This is normal and the skin will soften over time. For Men Only: It's OK to shave around the incision but do not shave the incision itself for 2 weeks. It is common to have numbness under your chin that could last for several months.  Diet  Resume your normal diet. There are no special food restrictions following this procedure. A low fat/low cholesterol diet is recommended for all patients with vascular disease. In order to heal from your surgery, it is CRITICAL to get adequate nutrition. Your body requires vitamins, minerals, and protein. Vegetables are the best  source of vitamins and minerals. Vegetables also provide the perfect balance of protein. Processed food has little nutritional value, so try to avoid this.        Medications  Resume taking all of your medications unless your doctor or physician assistant tells you not to. If your incision is causing pain, you may take over-the- counter pain relievers such as acetaminophen (Tylenol). If you were prescribed a stronger pain medication, please be aware these medications can cause nausea and constipation. Prevent nausea by taking the medication with a snack or meal. Avoid constipation by drinking plenty of fluids and eating foods with a high amount of fiber, such as fruits, vegetables, and grains. Do not take Tylenol if you are taking prescription pain medications.  Follow Up  Our office will schedule a follow up appointment 2-3 weeks following discharge.  Please call us immediately for any of the following conditions  Increased pain, redness, drainage (pus) from your incision site. Fever of 101 degrees or higher. If you should develop stroke (slurred speech, difficulty swallowing, weakness on one side of your body, loss of vision) you should call 911 and go to the nearest emergency room.  Reduce your risk of vascular disease:  Stop smoking. If you would like help call [facility] at 1-800-QUIT-NOW (1-800-784-8669) or [place] at 336-586-4000. Manage your cholesterol Maintain a desired weight Control your diabetes Keep your blood pressure down  If you have any questions, please call the office at [phone].   

## 2020-06-15 ENCOUNTER — Encounter (HOSPITAL_COMMUNITY): Payer: Self-pay | Admitting: Surgery

## 2020-06-15 DIAGNOSIS — J449 Chronic obstructive pulmonary disease, unspecified: Secondary | ICD-10-CM | POA: Diagnosis not present

## 2020-06-15 DIAGNOSIS — I63239 Cerebral infarction due to unspecified occlusion or stenosis of unspecified carotid arteries: Secondary | ICD-10-CM | POA: Diagnosis not present

## 2020-06-15 DIAGNOSIS — E782 Mixed hyperlipidemia: Secondary | ICD-10-CM | POA: Diagnosis not present

## 2020-06-15 DIAGNOSIS — G4733 Obstructive sleep apnea (adult) (pediatric): Secondary | ICD-10-CM | POA: Diagnosis not present

## 2020-06-15 DIAGNOSIS — I4891 Unspecified atrial fibrillation: Secondary | ICD-10-CM | POA: Diagnosis not present

## 2020-06-15 DIAGNOSIS — E119 Type 2 diabetes mellitus without complications: Secondary | ICD-10-CM | POA: Diagnosis not present

## 2020-06-15 DIAGNOSIS — N184 Chronic kidney disease, stage 4 (severe): Secondary | ICD-10-CM | POA: Diagnosis not present

## 2020-06-15 DIAGNOSIS — M6281 Muscle weakness (generalized): Secondary | ICD-10-CM | POA: Diagnosis not present

## 2020-06-15 DIAGNOSIS — R2681 Unsteadiness on feet: Secondary | ICD-10-CM | POA: Diagnosis not present

## 2020-06-15 DIAGNOSIS — I739 Peripheral vascular disease, unspecified: Secondary | ICD-10-CM | POA: Diagnosis not present

## 2020-06-15 DIAGNOSIS — I5032 Chronic diastolic (congestive) heart failure: Secondary | ICD-10-CM | POA: Diagnosis not present

## 2020-06-15 DIAGNOSIS — I10 Essential (primary) hypertension: Secondary | ICD-10-CM | POA: Diagnosis not present

## 2020-06-15 DIAGNOSIS — H34231 Retinal artery branch occlusion, right eye: Secondary | ICD-10-CM | POA: Diagnosis not present

## 2020-06-15 NOTE — Anesthesia Postprocedure Evaluation (Signed)
Anesthesia Post Note  Patient: Reginald [name]  Procedure(s) Performed: RIGHT TRANSCAROTID ARTERY REVASCULARIZATION (Right Neck)     Patient location during evaluation: PACU Anesthesia Type: General Level of consciousness: awake and alert Pain management: pain level controlled Vital Signs Assessment: post-procedure vital signs reviewed and stable Respiratory status: spontaneous breathing, nonlabored ventilation, respiratory function stable and patient connected to nasal cannula oxygen Cardiovascular status: blood pressure returned to baseline and stable Postop Assessment: no apparent nausea or vomiting Anesthetic complications: no   No complications documented.  Last Vitals:  Vitals:   06/13/20 0741 06/13/20 1109  BP: (!) 119/50 (!) 138/55  Pulse: (!) 51 (!) 50  Resp: 19 18  Temp: (!) 36.3 C (!) 36.4 C  SpO2: 95% 96%    Last Pain:  Vitals:   06/13/20 1109  TempSrc: Oral  PainSc: 0-No pain                 Brin Ruggerio

## 2020-06-17 DIAGNOSIS — I739 Peripheral vascular disease, unspecified: Secondary | ICD-10-CM | POA: Diagnosis not present

## 2020-06-17 DIAGNOSIS — E119 Type 2 diabetes mellitus without complications: Secondary | ICD-10-CM | POA: Diagnosis not present

## 2020-06-17 DIAGNOSIS — J449 Chronic obstructive pulmonary disease, unspecified: Secondary | ICD-10-CM | POA: Diagnosis not present

## 2020-06-17 DIAGNOSIS — I4891 Unspecified atrial fibrillation: Secondary | ICD-10-CM | POA: Diagnosis not present

## 2020-06-17 DIAGNOSIS — N184 Chronic kidney disease, stage 4 (severe): Secondary | ICD-10-CM | POA: Diagnosis not present

## 2020-06-17 DIAGNOSIS — G4733 Obstructive sleep apnea (adult) (pediatric): Secondary | ICD-10-CM | POA: Diagnosis not present

## 2020-06-17 DIAGNOSIS — R2681 Unsteadiness on feet: Secondary | ICD-10-CM | POA: Diagnosis not present

## 2020-06-17 DIAGNOSIS — I5032 Chronic diastolic (congestive) heart failure: Secondary | ICD-10-CM | POA: Diagnosis not present

## 2020-06-17 DIAGNOSIS — E782 Mixed hyperlipidemia: Secondary | ICD-10-CM | POA: Diagnosis not present

## 2020-06-17 DIAGNOSIS — M6281 Muscle weakness (generalized): Secondary | ICD-10-CM | POA: Diagnosis not present

## 2020-06-17 DIAGNOSIS — I10 Essential (primary) hypertension: Secondary | ICD-10-CM | POA: Diagnosis not present

## 2020-06-18 DIAGNOSIS — Z85828 Personal history of other malignant neoplasm of skin: Secondary | ICD-10-CM | POA: Diagnosis not present

## 2020-06-18 DIAGNOSIS — S0100XD Unspecified open wound of scalp, subsequent encounter: Secondary | ICD-10-CM | POA: Diagnosis not present

## 2020-06-19 DIAGNOSIS — E782 Mixed hyperlipidemia: Secondary | ICD-10-CM | POA: Diagnosis not present

## 2020-06-19 DIAGNOSIS — I4891 Unspecified atrial fibrillation: Secondary | ICD-10-CM | POA: Diagnosis not present

## 2020-06-19 DIAGNOSIS — N184 Chronic kidney disease, stage 4 (severe): Secondary | ICD-10-CM | POA: Diagnosis not present

## 2020-06-19 DIAGNOSIS — I5032 Chronic diastolic (congestive) heart failure: Secondary | ICD-10-CM | POA: Diagnosis not present

## 2020-06-19 DIAGNOSIS — J449 Chronic obstructive pulmonary disease, unspecified: Secondary | ICD-10-CM | POA: Diagnosis not present

## 2020-06-19 DIAGNOSIS — I739 Peripheral vascular disease, unspecified: Secondary | ICD-10-CM | POA: Diagnosis not present

## 2020-06-19 DIAGNOSIS — M6281 Muscle weakness (generalized): Secondary | ICD-10-CM | POA: Diagnosis not present

## 2020-06-19 DIAGNOSIS — R2681 Unsteadiness on feet: Secondary | ICD-10-CM | POA: Diagnosis not present

## 2020-06-19 DIAGNOSIS — E119 Type 2 diabetes mellitus without complications: Secondary | ICD-10-CM | POA: Diagnosis not present

## 2020-06-19 DIAGNOSIS — G4733 Obstructive sleep apnea (adult) (pediatric): Secondary | ICD-10-CM | POA: Diagnosis not present

## 2020-06-19 DIAGNOSIS — I10 Essential (primary) hypertension: Secondary | ICD-10-CM | POA: Diagnosis not present

## 2020-06-22 DIAGNOSIS — E782 Mixed hyperlipidemia: Secondary | ICD-10-CM | POA: Diagnosis not present

## 2020-06-22 DIAGNOSIS — G4733 Obstructive sleep apnea (adult) (pediatric): Secondary | ICD-10-CM | POA: Diagnosis not present

## 2020-06-22 DIAGNOSIS — I10 Essential (primary) hypertension: Secondary | ICD-10-CM | POA: Diagnosis not present

## 2020-06-22 DIAGNOSIS — I4891 Unspecified atrial fibrillation: Secondary | ICD-10-CM | POA: Diagnosis not present

## 2020-06-22 DIAGNOSIS — M6281 Muscle weakness (generalized): Secondary | ICD-10-CM | POA: Diagnosis not present

## 2020-06-22 DIAGNOSIS — I739 Peripheral vascular disease, unspecified: Secondary | ICD-10-CM | POA: Diagnosis not present

## 2020-06-22 DIAGNOSIS — E119 Type 2 diabetes mellitus without complications: Secondary | ICD-10-CM | POA: Diagnosis not present

## 2020-06-22 DIAGNOSIS — J449 Chronic obstructive pulmonary disease, unspecified: Secondary | ICD-10-CM | POA: Diagnosis not present

## 2020-06-22 DIAGNOSIS — I5032 Chronic diastolic (congestive) heart failure: Secondary | ICD-10-CM | POA: Diagnosis not present

## 2020-06-22 DIAGNOSIS — N184 Chronic kidney disease, stage 4 (severe): Secondary | ICD-10-CM | POA: Diagnosis not present

## 2020-06-22 DIAGNOSIS — R2681 Unsteadiness on feet: Secondary | ICD-10-CM | POA: Diagnosis not present

## 2020-06-24 DIAGNOSIS — M6281 Muscle weakness (generalized): Secondary | ICD-10-CM | POA: Diagnosis not present

## 2020-06-24 DIAGNOSIS — J449 Chronic obstructive pulmonary disease, unspecified: Secondary | ICD-10-CM | POA: Diagnosis not present

## 2020-06-24 DIAGNOSIS — I5032 Chronic diastolic (congestive) heart failure: Secondary | ICD-10-CM | POA: Diagnosis not present

## 2020-06-24 DIAGNOSIS — I4891 Unspecified atrial fibrillation: Secondary | ICD-10-CM | POA: Diagnosis not present

## 2020-06-24 DIAGNOSIS — N184 Chronic kidney disease, stage 4 (severe): Secondary | ICD-10-CM | POA: Diagnosis not present

## 2020-06-24 DIAGNOSIS — E119 Type 2 diabetes mellitus without complications: Secondary | ICD-10-CM | POA: Diagnosis not present

## 2020-06-24 DIAGNOSIS — I739 Peripheral vascular disease, unspecified: Secondary | ICD-10-CM | POA: Diagnosis not present

## 2020-06-24 DIAGNOSIS — I10 Essential (primary) hypertension: Secondary | ICD-10-CM | POA: Diagnosis not present

## 2020-06-24 DIAGNOSIS — E782 Mixed hyperlipidemia: Secondary | ICD-10-CM | POA: Diagnosis not present

## 2020-06-24 DIAGNOSIS — R2681 Unsteadiness on feet: Secondary | ICD-10-CM | POA: Diagnosis not present

## 2020-06-24 DIAGNOSIS — G4733 Obstructive sleep apnea (adult) (pediatric): Secondary | ICD-10-CM | POA: Diagnosis not present

## 2020-06-25 ENCOUNTER — Other Ambulatory Visit: Payer: Self-pay | Admitting: *Deleted

## 2020-06-25 DIAGNOSIS — I6529 Occlusion and stenosis of unspecified carotid artery: Secondary | ICD-10-CM

## 2020-06-26 DIAGNOSIS — E782 Mixed hyperlipidemia: Secondary | ICD-10-CM | POA: Diagnosis not present

## 2020-06-26 DIAGNOSIS — G4733 Obstructive sleep apnea (adult) (pediatric): Secondary | ICD-10-CM | POA: Diagnosis not present

## 2020-06-26 DIAGNOSIS — I4891 Unspecified atrial fibrillation: Secondary | ICD-10-CM | POA: Diagnosis not present

## 2020-06-26 DIAGNOSIS — J449 Chronic obstructive pulmonary disease, unspecified: Secondary | ICD-10-CM | POA: Diagnosis not present

## 2020-06-26 DIAGNOSIS — E119 Type 2 diabetes mellitus without complications: Secondary | ICD-10-CM | POA: Diagnosis not present

## 2020-06-26 DIAGNOSIS — I739 Peripheral vascular disease, unspecified: Secondary | ICD-10-CM | POA: Diagnosis not present

## 2020-06-26 DIAGNOSIS — I5032 Chronic diastolic (congestive) heart failure: Secondary | ICD-10-CM | POA: Diagnosis not present

## 2020-06-26 DIAGNOSIS — I10 Essential (primary) hypertension: Secondary | ICD-10-CM | POA: Diagnosis not present

## 2020-06-26 DIAGNOSIS — R2681 Unsteadiness on feet: Secondary | ICD-10-CM | POA: Diagnosis not present

## 2020-06-26 DIAGNOSIS — N184 Chronic kidney disease, stage 4 (severe): Secondary | ICD-10-CM | POA: Diagnosis not present

## 2020-06-26 DIAGNOSIS — M6281 Muscle weakness (generalized): Secondary | ICD-10-CM | POA: Diagnosis not present

## 2020-06-29 DIAGNOSIS — M6281 Muscle weakness (generalized): Secondary | ICD-10-CM | POA: Diagnosis not present

## 2020-06-29 DIAGNOSIS — I10 Essential (primary) hypertension: Secondary | ICD-10-CM | POA: Diagnosis not present

## 2020-06-29 DIAGNOSIS — I5032 Chronic diastolic (congestive) heart failure: Secondary | ICD-10-CM | POA: Diagnosis not present

## 2020-06-29 DIAGNOSIS — J449 Chronic obstructive pulmonary disease, unspecified: Secondary | ICD-10-CM | POA: Diagnosis not present

## 2020-06-29 DIAGNOSIS — E119 Type 2 diabetes mellitus without complications: Secondary | ICD-10-CM | POA: Diagnosis not present

## 2020-06-29 DIAGNOSIS — R2681 Unsteadiness on feet: Secondary | ICD-10-CM | POA: Diagnosis not present

## 2020-06-29 DIAGNOSIS — I4891 Unspecified atrial fibrillation: Secondary | ICD-10-CM | POA: Diagnosis not present

## 2020-06-29 DIAGNOSIS — E782 Mixed hyperlipidemia: Secondary | ICD-10-CM | POA: Diagnosis not present

## 2020-06-29 DIAGNOSIS — G4733 Obstructive sleep apnea (adult) (pediatric): Secondary | ICD-10-CM | POA: Diagnosis not present

## 2020-06-29 DIAGNOSIS — N184 Chronic kidney disease, stage 4 (severe): Secondary | ICD-10-CM | POA: Diagnosis not present

## 2020-06-29 DIAGNOSIS — I739 Peripheral vascular disease, unspecified: Secondary | ICD-10-CM | POA: Diagnosis not present

## 2020-07-02 DIAGNOSIS — H3411 Central retinal artery occlusion, right eye: Secondary | ICD-10-CM | POA: Diagnosis not present

## 2020-07-02 DIAGNOSIS — E119 Type 2 diabetes mellitus without complications: Secondary | ICD-10-CM | POA: Diagnosis not present

## 2020-07-02 DIAGNOSIS — H2513 Age-related nuclear cataract, bilateral: Secondary | ICD-10-CM | POA: Diagnosis not present

## 2020-07-02 DIAGNOSIS — Z7984 Long term (current) use of oral hypoglycemic drugs: Secondary | ICD-10-CM | POA: Diagnosis not present

## 2020-07-02 DIAGNOSIS — H52203 Unspecified astigmatism, bilateral: Secondary | ICD-10-CM | POA: Diagnosis not present

## 2020-07-02 DIAGNOSIS — H401134 Primary open-angle glaucoma, bilateral, indeterminate stage: Secondary | ICD-10-CM | POA: Diagnosis not present

## 2020-07-02 DIAGNOSIS — Z794 Long term (current) use of insulin: Secondary | ICD-10-CM | POA: Diagnosis not present

## 2020-07-02 DIAGNOSIS — H524 Presbyopia: Secondary | ICD-10-CM | POA: Diagnosis not present

## 2020-07-06 DIAGNOSIS — R2681 Unsteadiness on feet: Secondary | ICD-10-CM | POA: Diagnosis not present

## 2020-07-06 DIAGNOSIS — J449 Chronic obstructive pulmonary disease, unspecified: Secondary | ICD-10-CM | POA: Diagnosis not present

## 2020-07-06 DIAGNOSIS — E119 Type 2 diabetes mellitus without complications: Secondary | ICD-10-CM | POA: Diagnosis not present

## 2020-07-06 DIAGNOSIS — I5032 Chronic diastolic (congestive) heart failure: Secondary | ICD-10-CM | POA: Diagnosis not present

## 2020-07-06 DIAGNOSIS — N184 Chronic kidney disease, stage 4 (severe): Secondary | ICD-10-CM | POA: Diagnosis not present

## 2020-07-06 DIAGNOSIS — G4733 Obstructive sleep apnea (adult) (pediatric): Secondary | ICD-10-CM | POA: Diagnosis not present

## 2020-07-06 DIAGNOSIS — I10 Essential (primary) hypertension: Secondary | ICD-10-CM | POA: Diagnosis not present

## 2020-07-06 DIAGNOSIS — I739 Peripheral vascular disease, unspecified: Secondary | ICD-10-CM | POA: Diagnosis not present

## 2020-07-06 DIAGNOSIS — M6281 Muscle weakness (generalized): Secondary | ICD-10-CM | POA: Diagnosis not present

## 2020-07-06 DIAGNOSIS — I4891 Unspecified atrial fibrillation: Secondary | ICD-10-CM | POA: Diagnosis not present

## 2020-07-06 DIAGNOSIS — E782 Mixed hyperlipidemia: Secondary | ICD-10-CM | POA: Diagnosis not present

## 2020-07-07 DIAGNOSIS — E7849 Other hyperlipidemia: Secondary | ICD-10-CM | POA: Diagnosis not present

## 2020-07-07 DIAGNOSIS — I872 Venous insufficiency (chronic) (peripheral): Secondary | ICD-10-CM | POA: Diagnosis not present

## 2020-07-07 DIAGNOSIS — H349 Unspecified retinal vascular occlusion: Secondary | ICD-10-CM | POA: Diagnosis not present

## 2020-07-07 DIAGNOSIS — I2510 Atherosclerotic heart disease of native coronary artery without angina pectoris: Secondary | ICD-10-CM | POA: Diagnosis not present

## 2020-07-07 DIAGNOSIS — I480 Paroxysmal atrial fibrillation: Secondary | ICD-10-CM | POA: Diagnosis not present

## 2020-07-07 DIAGNOSIS — Z87438 Personal history of other diseases of male genital organs: Secondary | ICD-10-CM | POA: Diagnosis not present

## 2020-07-07 DIAGNOSIS — I5032 Chronic diastolic (congestive) heart failure: Secondary | ICD-10-CM | POA: Diagnosis not present

## 2020-07-07 DIAGNOSIS — I6389 Other cerebral infarction: Secondary | ICD-10-CM | POA: Diagnosis not present

## 2020-07-07 DIAGNOSIS — E118 Type 2 diabetes mellitus with unspecified complications: Secondary | ICD-10-CM | POA: Diagnosis not present

## 2020-07-07 DIAGNOSIS — I7789 Other specified disorders of arteries and arterioles: Secondary | ICD-10-CM | POA: Diagnosis not present

## 2020-07-07 DIAGNOSIS — N184 Chronic kidney disease, stage 4 (severe): Secondary | ICD-10-CM | POA: Diagnosis not present

## 2020-07-08 DIAGNOSIS — E119 Type 2 diabetes mellitus without complications: Secondary | ICD-10-CM | POA: Diagnosis not present

## 2020-07-08 DIAGNOSIS — N184 Chronic kidney disease, stage 4 (severe): Secondary | ICD-10-CM | POA: Diagnosis not present

## 2020-07-08 DIAGNOSIS — I4891 Unspecified atrial fibrillation: Secondary | ICD-10-CM | POA: Diagnosis not present

## 2020-07-08 DIAGNOSIS — J449 Chronic obstructive pulmonary disease, unspecified: Secondary | ICD-10-CM | POA: Diagnosis not present

## 2020-07-08 DIAGNOSIS — R2681 Unsteadiness on feet: Secondary | ICD-10-CM | POA: Diagnosis not present

## 2020-07-08 DIAGNOSIS — G4733 Obstructive sleep apnea (adult) (pediatric): Secondary | ICD-10-CM | POA: Diagnosis not present

## 2020-07-08 DIAGNOSIS — I739 Peripheral vascular disease, unspecified: Secondary | ICD-10-CM | POA: Diagnosis not present

## 2020-07-08 DIAGNOSIS — M6281 Muscle weakness (generalized): Secondary | ICD-10-CM | POA: Diagnosis not present

## 2020-07-08 DIAGNOSIS — E782 Mixed hyperlipidemia: Secondary | ICD-10-CM | POA: Diagnosis not present

## 2020-07-08 DIAGNOSIS — I5032 Chronic diastolic (congestive) heart failure: Secondary | ICD-10-CM | POA: Diagnosis not present

## 2020-07-08 DIAGNOSIS — I10 Essential (primary) hypertension: Secondary | ICD-10-CM | POA: Diagnosis not present

## 2020-07-09 DIAGNOSIS — R310 Gross hematuria: Secondary | ICD-10-CM | POA: Diagnosis not present

## 2020-07-09 DIAGNOSIS — N471 Phimosis: Secondary | ICD-10-CM | POA: Diagnosis not present

## 2020-07-13 ENCOUNTER — Ambulatory Visit (HOSPITAL_COMMUNITY)
Admission: RE | Admit: 2020-07-13 | Discharge: 2020-07-13 | Disposition: A | Discharge status: Home / Self Care | Payer: No Typology Code available for payment source | Source: Ambulatory Visit | Attending: Surgery | Admitting: Surgery

## 2020-07-13 ENCOUNTER — Other Ambulatory Visit: Payer: Self-pay

## 2020-07-13 ENCOUNTER — Ambulatory Visit (INDEPENDENT_AMBULATORY_CARE_PROVIDER_SITE_OTHER): Payer: Self-pay | Admitting: Surgery

## 2020-07-13 ENCOUNTER — Encounter: Payer: Self-pay | Admitting: Surgery

## 2020-07-13 VITALS — BP 155/87 | HR 69 | Temp 98.0°F | Resp 20 | Ht 71.0 in | Wt 310.0 lb

## 2020-07-13 DIAGNOSIS — I6529 Occlusion and stenosis of unspecified carotid artery: Secondary | ICD-10-CM | POA: Diagnosis not present

## 2020-07-13 DIAGNOSIS — I6521 Occlusion and stenosis of right carotid artery: Secondary | ICD-10-CM

## 2020-07-13 NOTE — Progress Notes (Signed)
Patient name: Reginald [name] MRN: [id number] DOB: 1936-03-06 Sex: male  REASON FOR VISIT:     post op   HISTORY OF PRESENT ILLNESS:   Reginald [name] is a 85 y.o. male who presented to the hospital in December 2021 with right central retinal artery occlusion.  This was felt to be secondary to plaque in his right carotid artery with stenosis measuring 70% on CT scan.  On 06/12/2020 he underwent right sided TCAR.  His postoperative course was uncomplicated.  Back today for follow-up. He still does not have right eye vision  CURRENT MEDICATIONS:    Current Outpatient Medications  Medication Sig Dispense Refill  . acetaminophen (TYLENOL) 325 MG tablet Take 650 mg by mouth every 4 (four) hours as needed for moderate pain.     Marland Kitchen albuterol (VENTOLIN HFA) 108 (90 Base) MCG/ACT inhaler Inhale 2 puffs into the lungs 2 (two) times daily as needed for wheezing or shortness of breath.    Marland Kitchen apixaban (ELIQUIS) 2.5 MG TABS tablet Take 2.5 mg by mouth 2 (two) times daily.    Marland Kitchen aspirin EC 81 MG EC tablet Take 1 tablet (81 mg total) by mouth daily. Swallow whole. 30 tablet 11  . atorvastatin (LIPITOR) 40 MG tablet Take 40 mg by mouth at bedtime.    . cetirizine (ZYRTEC) 10 MG tablet Take 5 mg by mouth at bedtime.     . cholecalciferol (VITAMIN D) 25 MCG (1000 UNIT) tablet Take 2,000 Units by mouth daily.    . clopidogrel (PLAVIX) 75 MG tablet Take 1 tablet (75 mg total) by mouth daily.    Marland Kitchen docusate sodium (COLACE) 100 MG capsule Take 100 mg by mouth at bedtime.    . Dulaglutide (TRULICITY) 3 FG/1.8EX SOPN Inject 3 mg into the skin once a week. Mondays    . gabapentin (NEURONTIN) 100 MG capsule Take 200 mg by mouth at bedtime.     . hydroxypropyl methylcellulose / hypromellose (ISOPTO TEARS / GONIOVISC) 2.5 % ophthalmic solution Place 1 drop into both eyes 4 (four) times daily.    . insulin lispro (HUMALOG) 100 UNIT/ML injection Inject 0-14 Units into the skin See admin  instructions. Inject 0-14 units subcutaneously twice daily per sliding scale: CBG 0-200 0 units, 201-250 2 units, 251-300 4 units, 301-350 6 units, 351-400 8 units, 401-450 10 units, 451-500 12 units, >500 14 units If blood sugar is > 400 or < 80 call pec triage    . latanoprost (XALATAN) 0.005 % ophthalmic solution Place 1 drop into both eyes at bedtime.    . magnesium oxide (MAG-OX) 400 MG tablet Take 400 mg by mouth daily.    . Melatonin 5 MG TABS Take 5 mg by mouth at bedtime.    . Menthol, Topical Analgesic, (BIOFREEZE) 4 % GEL Apply 1 application topically 2 (two) times daily as needed (forehead and left arm pain).    . metoprolol succinate (TOPROL XL) 25 MG 24 hr tablet Take 0.5 tablets (12.5 mg total) by mouth daily. 15 tablet 11  . nitroGLYCERIN (NITROSTAT) 0.4 MG SL tablet Place 0.4 mg under the tongue every 5 (five) minutes as needed for chest pain.    Marland Kitchen oxybutynin (DITROPAN) 5 MG tablet Take 5 mg by mouth every 8 (eight) hours as needed for bladder spasms.     . polyethylene glycol (MIRALAX / GLYCOLAX) 17 g packet Take 17 g by mouth daily. Mix in 6 oz fluid and drink    . Skin Protectants, Misc. (  EUCERIN) cream Apply 1 application topically 2 (two) times a day. For itching    . tamsulosin (FLOMAX) 0.4 MG CAPS capsule Take 1 capsule (0.4 mg total) by mouth daily after supper. 30 capsule 0  . vitamin B-12 (CYANOCOBALAMIN) 1000 MCG tablet Take 1,000 mcg by mouth daily.     No current facility-administered medications for this visit.    REVIEW OF SYSTEMS:   [X]  denotes positive finding, [ ]  denotes negative finding Cardiac  Comments:  Chest pain or chest pressure:    Shortness of breath upon exertion:    Short of breath when lying flat:    Irregular heart rhythm:    Constitutional    Fever or chills:      PHYSICAL EXAM:   Vitals:   07/13/20 1529  BP: (!) 155/87  Pulse: 69  Resp: 20  Temp: 98 F (36.7 C)  SpO2: 93%  Weight: (!) 310 lb (140.6 kg)  Height: 5\' 11"  (1.803 m)     GENERAL: The patient is a well-nourished male, in no acute distress. The vital signs are documented above. CARDIOVASCULAR: There is a regular rate and rhythm. PULMONARY: Non-labored respirations Incision well healed  STUDIES:   Right Carotid: There is no evidence of stenosis in the right ICA.         Non-hemodynamically significant plaque <50% noted in the  CCA.   Left Carotid: Velocities in the left ICA are consistent with a 1-39%  stenosis.        The ECA appears >50% stenosed.   Vertebrals: Bilateral vertebral arteries demonstrate antegrade flow.  Subclavians: Normal flow hemodynamics were seen in bilateral subclavian        arteries.    MEDICAL ISSUES:   Plavix discontinued today Follow up duplex in 9 months  [name], MD, FACS [facility] [phone] Pager [phone]

## 2020-07-14 DIAGNOSIS — N184 Chronic kidney disease, stage 4 (severe): Secondary | ICD-10-CM | POA: Diagnosis not present

## 2020-07-14 DIAGNOSIS — E782 Mixed hyperlipidemia: Secondary | ICD-10-CM | POA: Diagnosis not present

## 2020-07-14 DIAGNOSIS — G4733 Obstructive sleep apnea (adult) (pediatric): Secondary | ICD-10-CM | POA: Diagnosis not present

## 2020-07-14 DIAGNOSIS — I480 Paroxysmal atrial fibrillation: Secondary | ICD-10-CM | POA: Diagnosis not present

## 2020-07-14 DIAGNOSIS — I739 Peripheral vascular disease, unspecified: Secondary | ICD-10-CM | POA: Diagnosis not present

## 2020-07-14 DIAGNOSIS — E119 Type 2 diabetes mellitus without complications: Secondary | ICD-10-CM | POA: Diagnosis not present

## 2020-07-14 DIAGNOSIS — M6281 Muscle weakness (generalized): Secondary | ICD-10-CM | POA: Diagnosis not present

## 2020-07-14 DIAGNOSIS — Z8673 Personal history of transient ischemic attack (TIA), and cerebral infarction without residual deficits: Secondary | ICD-10-CM | POA: Diagnosis not present

## 2020-07-14 DIAGNOSIS — I5032 Chronic diastolic (congestive) heart failure: Secondary | ICD-10-CM | POA: Diagnosis not present

## 2020-07-14 DIAGNOSIS — I10 Essential (primary) hypertension: Secondary | ICD-10-CM | POA: Diagnosis not present

## 2020-07-14 DIAGNOSIS — I7389 Other specified peripheral vascular diseases: Secondary | ICD-10-CM | POA: Diagnosis not present

## 2020-07-14 DIAGNOSIS — J449 Chronic obstructive pulmonary disease, unspecified: Secondary | ICD-10-CM | POA: Diagnosis not present

## 2020-07-14 DIAGNOSIS — R2681 Unsteadiness on feet: Secondary | ICD-10-CM | POA: Diagnosis not present

## 2020-07-14 DIAGNOSIS — I4891 Unspecified atrial fibrillation: Secondary | ICD-10-CM | POA: Diagnosis not present

## 2020-07-14 DIAGNOSIS — E7849 Other hyperlipidemia: Secondary | ICD-10-CM | POA: Diagnosis not present

## 2020-07-15 ENCOUNTER — Other Ambulatory Visit: Payer: Self-pay

## 2020-07-15 DIAGNOSIS — I6521 Occlusion and stenosis of right carotid artery: Secondary | ICD-10-CM

## 2020-07-17 DIAGNOSIS — E119 Type 2 diabetes mellitus without complications: Secondary | ICD-10-CM | POA: Diagnosis not present

## 2020-07-17 DIAGNOSIS — I5032 Chronic diastolic (congestive) heart failure: Secondary | ICD-10-CM | POA: Diagnosis not present

## 2020-07-17 DIAGNOSIS — M6281 Muscle weakness (generalized): Secondary | ICD-10-CM | POA: Diagnosis not present

## 2020-07-17 DIAGNOSIS — N184 Chronic kidney disease, stage 4 (severe): Secondary | ICD-10-CM | POA: Diagnosis not present

## 2020-07-17 DIAGNOSIS — R2681 Unsteadiness on feet: Secondary | ICD-10-CM | POA: Diagnosis not present

## 2020-07-17 DIAGNOSIS — I4891 Unspecified atrial fibrillation: Secondary | ICD-10-CM | POA: Diagnosis not present

## 2020-07-17 DIAGNOSIS — G4733 Obstructive sleep apnea (adult) (pediatric): Secondary | ICD-10-CM | POA: Diagnosis not present

## 2020-07-17 DIAGNOSIS — I739 Peripheral vascular disease, unspecified: Secondary | ICD-10-CM | POA: Diagnosis not present

## 2020-07-17 DIAGNOSIS — E782 Mixed hyperlipidemia: Secondary | ICD-10-CM | POA: Diagnosis not present

## 2020-07-17 DIAGNOSIS — I10 Essential (primary) hypertension: Secondary | ICD-10-CM | POA: Diagnosis not present

## 2020-07-17 DIAGNOSIS — J449 Chronic obstructive pulmonary disease, unspecified: Secondary | ICD-10-CM | POA: Diagnosis not present

## 2020-07-20 ENCOUNTER — Encounter: Payer: Self-pay | Admitting: Adult Health

## 2020-07-20 ENCOUNTER — Ambulatory Visit (INDEPENDENT_AMBULATORY_CARE_PROVIDER_SITE_OTHER): Payer: Medicare Other | Admitting: Adult Health

## 2020-07-20 VITALS — BP 140/82 | HR 72 | Ht 71.0 in | Wt 306.0 lb

## 2020-07-20 DIAGNOSIS — I4821 Permanent atrial fibrillation: Secondary | ICD-10-CM

## 2020-07-20 DIAGNOSIS — H3411 Central retinal artery occlusion, right eye: Secondary | ICD-10-CM | POA: Diagnosis not present

## 2020-07-20 DIAGNOSIS — I10 Essential (primary) hypertension: Secondary | ICD-10-CM

## 2020-07-20 DIAGNOSIS — E782 Mixed hyperlipidemia: Secondary | ICD-10-CM

## 2020-07-20 DIAGNOSIS — E1322 Other specified diabetes mellitus with diabetic chronic kidney disease: Secondary | ICD-10-CM

## 2020-07-20 DIAGNOSIS — Z8673 Personal history of transient ischemic attack (TIA), and cerebral infarction without residual deficits: Secondary | ICD-10-CM | POA: Diagnosis not present

## 2020-07-20 DIAGNOSIS — I129 Hypertensive chronic kidney disease with stage 1 through stage 4 chronic kidney disease, or unspecified chronic kidney disease: Secondary | ICD-10-CM | POA: Diagnosis not present

## 2020-07-20 DIAGNOSIS — I63239 Cerebral infarction due to unspecified occlusion or stenosis of unspecified carotid arteries: Secondary | ICD-10-CM

## 2020-07-20 DIAGNOSIS — N184 Chronic kidney disease, stage 4 (severe): Secondary | ICD-10-CM

## 2020-07-20 NOTE — Progress Notes (Signed)
I agree with the above plan 

## 2020-07-20 NOTE — Progress Notes (Signed)
[facility] [address] [place]. [place] [address] [phone]       HOSPITAL FOLLOW UP NOTE  [name] Date of Birth:  1936-04-25 Medical Record Number:  [id number]   Reason for Referral:  hospital stroke follow up    SUBJECTIVE:   CHIEF COMPLAINT:  Chief Complaint  Patient presents with  . Follow-up    Rm 14 alone Pt states he has had a stroke and is blind in one eye    HPI:   [name] is a 85 y.o. male with history of COPD, coronary artery disease, hypertension, hyperlipidemia, obstructive sleep apnea, diabetes, congestive heart failure, CKD, atrial fibrillation on apixaban, who presented on 06/09/2020 with sudden onset of painless vision loss in the right eye Sunday afternoon.   Personally reviewed hospitalization pertinent progress notes, lab work and imaging with summary provided.  Evaluated by Dr. Erlinda Hong with stroke work-up revealing R CRAO in setting of symptomatic R ICA stenosis and known AF on Eliquis.  MRI did show possible punctate subacute R precentral gyrus infarct involves old R occipital infarct.  CTA head/neck showed right ICA 70% stenosis and moderate to severe B VA stenosis.  Evaluated by Dr. Trula Slade and underwent TCAR and placed on DAPT with outpatient follow-up advised.  History of chronic A. fib with Eliquis held for TCAR procedure and restarted at discharge.  History of HTN stable during admission.  History of HLD with LDL 51 and continued home dose atorvastatin 40 mg daily.  Uncontrolled DM with A1c 8.1.  Other stroke risk factors include advanced age, morbid obesity, CAD, OSA on CPAP, CHF, PVD and prior strokes on imaging.  Other active problems include COPD, CKD and chronic venous insufficiency.  Evaluated by therapies and recommended discharge to SNF (originally from nursing home w/c bound past 2 years).  R CRAO in setting of symptomatic R ICA stenosis and known AF on Eliquis  MRI brain and orbits Probably punctate  subacute R precentral gyrus infarct. 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 Moderate small vessel disease. Possible high L frontoparietal scalp lesion.  CT head no acute abnormality. 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 Small vessel disease. Atrophy. R posterior falx possible meningioma. 2.1cm L parietal scalp lesion.  CTA head no LVO. Intracranial atherosclerosis w/ multifocal stenoses:  Moderate paraclinoid L ICA, moderate proximal L M1, high-grade R A4, high-grade R P2, moderate / severe L P2/3 jxn    CTA neck R ICA 70% soft and calcified plaque. Moderate to severe B VA origin stenoses.  2D Echo EF 55-60%. MV myxomatous.   LDL 51  HgbA1c 8.1  VTE prophylaxis - SCDs    Eliquis (apixaban) daily prior to admission, now on aspirin 81 mg daily and clopidogrel 75 mg daily following plavix load. (Eliquis stopped for TCAR)   Therapy recommendations:  SNF  Disposition:  Return SNF (from nsg home, w/c bound past 2 yrs)  Today, 07/20/2020, [name] is being seen for hospital follow-up unaccompanied.  He continues to reside at [facility] and rehab.  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  He has since been evaluated by ophthalmology since discharge for cataract evaluation and plans on continuing monitoring at this time due to recent stroke.  He plans on continue to follow with established ophthalmologist Dr. Gershon Crane in the near future.  Denies new stroke/TIA symptoms.  He remains wheelchair-bound over the past 2 years after knee injury per patient and has continued to reside at [facility] and rehab  since that time.  Remains on aspirin, Plavix and Eliquis for secondary stroke prevention without side effects.  Recently seen by [facility] who advised to discontinue Plavix but per review of facility MAR, Plavix has been continued.  Carotid duplex showed no residual right ICA stenosis and plans on repeating duplex 9 months post procedure.  Remains on atorvastatin without myalgias.  Blood  pressure today initially elevated but on recheck 140/82 -typically 120s-130s/80s at facility.  Glucose levels range 150-220 at facility.  Endorses nightly compliance with CPAP for OSA management.  No further concerns at this time.    ROS:   14 system review of systems performed and negative with exception of those listed in HPI  PMH:  Past Medical History:  Diagnosis Date  . Anteroseptal myocardial infarction ([place])   . Carotid artery occlusion   . CHF (congestive heart failure) ([place])   . Chronic kidney disease   . COPD (chronic obstructive pulmonary disease) ([place])   . Coronary atherosclerosis of unspecified type of vessel, native or graft    a. s/p prior PCI to LAD in 1997  . Diverticulosis   . ED (erectile dysfunction)   . Foley catheter in place   . Glaucoma   . Hematuria   . History of 2019 novel coronavirus disease (COVID-19) 11/2018  . Hydronephrosis, bilateral   . Hydroureter   . Hypercholesteremia   . Hypertensive renal disease   . Hypogonadism male   . Hypokalemia   . Obesity hypoventilation syndrome ([place])   . Obesity, unspecified   . OSA (obstructive sleep apnea)   . Peripheral vascular disease ([place])   . Permanent atrial fibrillation ([place])   . Phimosis   . Pneumonia   . Rhabdomyolysis   . Tremor   . Type II or unspecified type diabetes mellitus without mention of complication, not stated as uncontrolled   . Unspecified essential hypertension   . Urinary incontinence   . Urinary retention   . Vestibulopathy     PSH:  Past Surgical History:  Procedure Laterality Date  . APPENDECTOMY  1985  . CARDIAC CATHETERIZATION    . CHOLECYSTECTOMY  06/2000  . CIRCUMCISION N/A 05/28/2020   Procedure: CIRCUMCISION ADULT;  Surgeon: Franchot Gallo, MD;  Location: [facility] ORS;  Service: Urology;  Laterality: N/A;  . CORONARY ANGIOPLASTY  08/14/1995   stent placement to LAD   . DORSAL SLIT N/A 05/28/2020   Procedure: DORSAL SLIT;  Surgeon: Franchot Gallo, MD;   Location: [facility] ORS;  Service: Urology;  Laterality: N/A;  60 MINS  . ENDOVENOUS ABLATION SAPHENOUS VEIN W/ LASER Left 02/21/2018   endovenous laser ablation L GSV by Ruta Hinds MD   . [place] Right 1985  . KNEE ARTHROSCOPY Left 1991  . LUMBAR FUSION    . NOSE SURGERY  1970s   Per Dr. Terrence Dupont [facility] in pt chart  . TRANSCAROTID ARTERY REVASCULARIZATION Right 06/12/2020   Procedure: RIGHT TRANSCAROTID ARTERY REVASCULARIZATION;  Surgeon: Serafina Mitchell, MD;  Location: [facility] OR;  Service: Vascular;  Laterality: Right;    Social History:  Social History   Socioeconomic History  . Marital status: Single    Spouse name: Not on file  . Number of children: 5  . Years of education: Not on file  . Highest education level: Not on file  Occupational History  . Occupation: retired  Tobacco Use  . Smoking status: Never Smoker  . Smokeless tobacco: Never Used  Vaping Use  . Vaping Use: Never used  Substance  and Sexual Activity  . Alcohol use: No    Alcohol/week: 0.0 standard drinks  . Drug use: No  . Sexual activity: Never  Other Topics Concern  . Not on file  Social History Narrative  . Not on file   Social Determinants of Health   Financial Resource Strain: Not on file  Food Insecurity: Not on file  Transportation Needs: Not on file  Physical Activity: Not on file  Stress: Not on file  Social Connections: Not on file  Intimate Partner Violence: Not on file    Family History:  Family History  Problem Relation Age of Onset  . COPD Mother   . Heart disease Mother   . Diabetes Father   . Heart disease Father   . Diabetes Sister   . Heart disease Brother   . Heart disease Brother   . Breast cancer Maternal Aunt   . Diabetes Paternal Grandmother   . Colon cancer Maternal Aunt   . Ovarian cancer Daughter   . Glaucoma Other     Medications:   Current Outpatient Medications on File Prior to Visit  Medication Sig Dispense Refill  . acetaminophen (TYLENOL) 325  MG tablet Take 650 mg by mouth every 4 (four) hours as needed for moderate pain.     Marland Kitchen albuterol (VENTOLIN HFA) 108 (90 Base) MCG/ACT inhaler Inhale 2 puffs into the lungs 2 (two) times daily as needed for wheezing or shortness of breath.    Marland Kitchen apixaban (ELIQUIS) 2.5 MG TABS tablet Take 2.5 mg by mouth 2 (two) times daily.    Marland Kitchen aspirin EC 81 MG EC tablet Take 1 tablet (81 mg total) by mouth daily. Swallow whole. 30 tablet 11  . atorvastatin (LIPITOR) 40 MG tablet Take 40 mg by mouth at bedtime.    . cetirizine (ZYRTEC) 10 MG tablet Take 5 mg by mouth at bedtime.     . cholecalciferol (VITAMIN D) 25 MCG (1000 UNIT) tablet Take 2,000 Units by mouth daily.    Marland Kitchen docusate sodium (COLACE) 100 MG capsule Take 100 mg by mouth at bedtime.    . Dulaglutide (TRULICITY) 3 VC/9.4WH SOPN Inject 3 mg into the skin once a week. Mondays    . gabapentin (NEURONTIN) 100 MG capsule Take 200 mg by mouth at bedtime.     . hydroxypropyl methylcellulose / hypromellose (ISOPTO TEARS / GONIOVISC) 2.5 % ophthalmic solution Place 1 drop into both eyes 4 (four) times daily.    . insulin lispro (HUMALOG) 100 UNIT/ML injection Inject 0-14 Units into the skin See admin instructions. Inject 0-14 units subcutaneously twice daily per sliding scale: CBG 0-200 0 units, 201-250 2 units, 251-300 4 units, 301-350 6 units, 351-400 8 units, 401-450 10 units, 451-500 12 units, >500 14 units If blood sugar is > 400 or < 80 call pec triage    . latanoprost (XALATAN) 0.005 % ophthalmic solution Place 1 drop into both eyes at bedtime.    . magnesium oxide (MAG-OX) 400 MG tablet Take 400 mg by mouth daily.    . Melatonin 5 MG TABS Take 5 mg by mouth at bedtime.    . Menthol, Topical Analgesic, (BIOFREEZE) 4 % GEL Apply 1 application topically 2 (two) times daily as needed (forehead and left arm pain).    . metoprolol succinate (TOPROL XL) 25 MG 24 hr tablet Take 0.5 tablets (12.5 mg total) by mouth daily. 15 tablet 11  . nitroGLYCERIN (NITROSTAT)  0.4 MG SL tablet Place 0.4 mg under the tongue  every 5 (five) minutes as needed for chest pain.    Marland Kitchen oxybutynin (DITROPAN) 5 MG tablet Take 5 mg by mouth every 8 (eight) hours as needed for bladder spasms.     . polyethylene glycol (MIRALAX / GLYCOLAX) 17 g packet Take 17 g by mouth daily. Mix in 6 oz fluid and drink    . Skin Protectants, Misc. (EUCERIN) cream Apply 1 application topically 2 (two) times a day. For itching    . tamsulosin (FLOMAX) 0.4 MG CAPS capsule Take 1 capsule (0.4 mg total) by mouth daily after supper. 30 capsule 0  . vitamin B-12 (CYANOCOBALAMIN) 1000 MCG tablet Take 1,000 mcg by mouth daily.     No current facility-administered medications on file prior to visit.    Allergies:   Allergies  Allergen Reactions  . Adhesive [Tape] Rash      OBJECTIVE:  Physical Exam  Vitals:   07/20/20 1253  BP: 140/82  Pulse: 72  Weight: (!) 306 lb (138.8 kg)  Height: 5\' 11"  (1.803 m)   Body mass index is 42.68 kg/m. No exam data present  Post stroke PHQ 2/9 Depression screen PHQ 2/9 07/20/2020  Decreased Interest 0  Down, Depressed, Hopeless 0  PHQ - 2 Score 0     General: Morbidly obese very pleasant elderly Caucasian male, seated, in no evident distress Head: head normocephalic and atraumatic.   Neck: supple with no carotid or supraclavicular bruits Cardiovascular: irregular rate and rhythm, no murmurs Musculoskeletal: no deformity Skin:  BLE dressings (for edema per patient) Vascular:  Normal pulses all extremities   Neurologic Exam Mental Status: Awake and fully alert.   Fluent speech and language.  Oriented to place and time. Recent and remote memory intact. Attention span, concentration and fund of knowledge appropriate. Mood and affect appropriate.  Cranial Nerves: Pupils equal, briskly reactive to light. Extraocular movements full without nystagmus. Visual fields full to confrontation OS and OD blindness except upper outer edge. Hearing intact. Facial  sensation intact. Face, tongue, palate moves normally and symmetrically.  Motor: Normal bulk and tone. Normal strength in all tested extremity muscles Sensory.: intact to touch , pinprick , position and vibratory sensation.  Coordination: Rapid alternating movements normal in all extremities. Finger-to-nose and heel-to-shin performed accurately bilaterally. Gait and Station: Deferred as patient nonambulatory Reflexes: 1+ and symmetric. Toes downgoing.     NIHSS  1 Modified Rankin  1      ASSESSMENT: Reginald [name] is a 85 y.o. year old male presented with painless visual loss right eye on 06/09/2020 with stroke work-up revealing R CRAO in setting of symptomatic R ICA stenosis and known A. fib on Eliquis. S/p R ICA TCAR.  Vascular risk factors include chronic A. fib on Eliquis, HTN, HLD, DM, CAD, OSA on CPAP, carotid stenosis, CHF and prior strokes on imaging.      PLAN:  1. R CARO, prior strokes on imaging:  a. Residual deficit: OD visual loss -reports some improvement able to see upper outer edge.  Advised continued routine follow-up with ophthalmology for routine monitoring.   b. Continue aspirin 81 mg daily and Eliquis (apixaban) daily  and atorvastatin 40 mg daily for secondary stroke prevention.   c. Discussed secondary stroke prevention measures and importance of close PCP follow up for aggressive stroke risk factor management  2. R ICA stenosis:  a. s/p TCAR 06/12/2020 b. carotid duplex 07/13/2020 right ICA no evidence of stenosis and left ICA 1 to 39% stenosis with ECA > 50%  c.  plan on repeat carotid duplex with Dr. Trula Slade 9 months post procedure.   d. Discontinue Plavix as advised by Dr. Trula Slade during [place] on 322 3. Chronic A. Fib:  a. CHA2DS2-VASc score of at least 8.   b. Asymptomatic irregular rhythm.   c. Remains on Eliquis per cardiology 4. HTN: BP goal <130/90.  Well-controlled continuously monitored by facility 5. HLD: LDL goal <70. Recent LDL 51 on atorvastatin  40 mg daily per PCP.  6. DMII: A1c goal<7.0. Recent V9T 8.1. on Trulicity and Humalog per facility    Follow up in 4 months or call earlier if needed   I spent 45 minutes of face-to-face and non-face-to-face time with patient.  This included previsit chart review including recent hospitalization pertinent progress notes, lab work and imaging, lab review, study review, order entry, electronic health record documentation, patient education regarding recent OD CRAO and prior strokes on imaging, residual deficits, importance of managing stroke risk factors and answered all questions to patient satisfaction   Frann Rider, AGNP-BC  [facility] [address] [place], [place] [address]  Phone [phone] Fax [phone] Note: This document was prepared with digital dictation and possible smart phrase technology. Any transcriptional errors that result from this process are unintentional.

## 2020-07-20 NOTE — Patient Instructions (Addendum)
Continue aspirin 81 mg daily and Eliquis (apixaban) daily  and atorvastatin 40 mg daily for secondary stroke prevention  Discontinue plavix as advised by vascular surgery   Continue to follow with Dr. Trula Slade vascular surgery with plans on repeating carotid ultrasound in 9 months  Continue to follow with eye doctor as advised   Continue to follow up with PCP regarding cholesterol, blood pressure and diabetes management  Maintain strict control of hypertension with blood pressure goal below 130/90, diabetes with hemoglobin A1c goal below 7.0 % and cholesterol with LDL cholesterol (bad cholesterol) goal below 70 mg/dL.      Followup in the future with me in 4 months or call earlier if needed       Thank you for coming to see [place] at [facility]. I hope we have been able to provide you high quality care today.  You may receive a patient satisfaction survey over the next few weeks. We would appreciate your feedback and comments so that we may continue to improve ourselves and the health of our patients.

## 2020-07-21 DIAGNOSIS — E1149 Type 2 diabetes mellitus with other diabetic neurological complication: Secondary | ICD-10-CM | POA: Diagnosis not present

## 2020-07-21 DIAGNOSIS — E7849 Other hyperlipidemia: Secondary | ICD-10-CM | POA: Diagnosis not present

## 2020-07-21 DIAGNOSIS — I129 Hypertensive chronic kidney disease with stage 1 through stage 4 chronic kidney disease, or unspecified chronic kidney disease: Secondary | ICD-10-CM | POA: Diagnosis not present

## 2020-07-21 DIAGNOSIS — Z8673 Personal history of transient ischemic attack (TIA), and cerebral infarction without residual deficits: Secondary | ICD-10-CM | POA: Diagnosis not present

## 2020-07-23 DIAGNOSIS — I739 Peripheral vascular disease, unspecified: Secondary | ICD-10-CM | POA: Diagnosis not present

## 2020-07-23 DIAGNOSIS — I10 Essential (primary) hypertension: Secondary | ICD-10-CM | POA: Diagnosis not present

## 2020-07-23 DIAGNOSIS — I5032 Chronic diastolic (congestive) heart failure: Secondary | ICD-10-CM | POA: Diagnosis not present

## 2020-07-23 DIAGNOSIS — M6281 Muscle weakness (generalized): Secondary | ICD-10-CM | POA: Diagnosis not present

## 2020-07-23 DIAGNOSIS — J449 Chronic obstructive pulmonary disease, unspecified: Secondary | ICD-10-CM | POA: Diagnosis not present

## 2020-07-23 DIAGNOSIS — G4733 Obstructive sleep apnea (adult) (pediatric): Secondary | ICD-10-CM | POA: Diagnosis not present

## 2020-07-23 DIAGNOSIS — R2681 Unsteadiness on feet: Secondary | ICD-10-CM | POA: Diagnosis not present

## 2020-07-23 DIAGNOSIS — N184 Chronic kidney disease, stage 4 (severe): Secondary | ICD-10-CM | POA: Diagnosis not present

## 2020-07-23 DIAGNOSIS — E119 Type 2 diabetes mellitus without complications: Secondary | ICD-10-CM | POA: Diagnosis not present

## 2020-07-23 DIAGNOSIS — E782 Mixed hyperlipidemia: Secondary | ICD-10-CM | POA: Diagnosis not present

## 2020-07-23 DIAGNOSIS — I4891 Unspecified atrial fibrillation: Secondary | ICD-10-CM | POA: Diagnosis not present

## 2020-07-24 DIAGNOSIS — I639 Cerebral infarction, unspecified: Secondary | ICD-10-CM | POA: Diagnosis not present

## 2020-07-24 DIAGNOSIS — H547 Unspecified visual loss: Secondary | ICD-10-CM | POA: Diagnosis not present

## 2020-07-24 DIAGNOSIS — R5381 Other malaise: Secondary | ICD-10-CM | POA: Diagnosis not present

## 2020-07-28 DIAGNOSIS — N184 Chronic kidney disease, stage 4 (severe): Secondary | ICD-10-CM | POA: Diagnosis not present

## 2020-07-28 DIAGNOSIS — I5032 Chronic diastolic (congestive) heart failure: Secondary | ICD-10-CM | POA: Diagnosis not present

## 2020-07-28 DIAGNOSIS — E782 Mixed hyperlipidemia: Secondary | ICD-10-CM | POA: Diagnosis not present

## 2020-07-28 DIAGNOSIS — I739 Peripheral vascular disease, unspecified: Secondary | ICD-10-CM | POA: Diagnosis not present

## 2020-07-28 DIAGNOSIS — I4891 Unspecified atrial fibrillation: Secondary | ICD-10-CM | POA: Diagnosis not present

## 2020-07-28 DIAGNOSIS — M6281 Muscle weakness (generalized): Secondary | ICD-10-CM | POA: Diagnosis not present

## 2020-07-28 DIAGNOSIS — R2681 Unsteadiness on feet: Secondary | ICD-10-CM | POA: Diagnosis not present

## 2020-07-28 DIAGNOSIS — G4733 Obstructive sleep apnea (adult) (pediatric): Secondary | ICD-10-CM | POA: Diagnosis not present

## 2020-07-28 DIAGNOSIS — E119 Type 2 diabetes mellitus without complications: Secondary | ICD-10-CM | POA: Diagnosis not present

## 2020-07-28 DIAGNOSIS — I10 Essential (primary) hypertension: Secondary | ICD-10-CM | POA: Diagnosis not present

## 2020-07-28 DIAGNOSIS — J449 Chronic obstructive pulmonary disease, unspecified: Secondary | ICD-10-CM | POA: Diagnosis not present

## 2020-07-30 DIAGNOSIS — M6281 Muscle weakness (generalized): Secondary | ICD-10-CM | POA: Diagnosis not present

## 2020-07-30 DIAGNOSIS — I739 Peripheral vascular disease, unspecified: Secondary | ICD-10-CM | POA: Diagnosis not present

## 2020-07-30 DIAGNOSIS — E119 Type 2 diabetes mellitus without complications: Secondary | ICD-10-CM | POA: Diagnosis not present

## 2020-07-30 DIAGNOSIS — I5032 Chronic diastolic (congestive) heart failure: Secondary | ICD-10-CM | POA: Diagnosis not present

## 2020-07-30 DIAGNOSIS — E782 Mixed hyperlipidemia: Secondary | ICD-10-CM | POA: Diagnosis not present

## 2020-07-30 DIAGNOSIS — J449 Chronic obstructive pulmonary disease, unspecified: Secondary | ICD-10-CM | POA: Diagnosis not present

## 2020-07-30 DIAGNOSIS — R2681 Unsteadiness on feet: Secondary | ICD-10-CM | POA: Diagnosis not present

## 2020-07-30 DIAGNOSIS — G4733 Obstructive sleep apnea (adult) (pediatric): Secondary | ICD-10-CM | POA: Diagnosis not present

## 2020-07-30 DIAGNOSIS — I10 Essential (primary) hypertension: Secondary | ICD-10-CM | POA: Diagnosis not present

## 2020-07-30 DIAGNOSIS — N184 Chronic kidney disease, stage 4 (severe): Secondary | ICD-10-CM | POA: Diagnosis not present

## 2020-07-30 DIAGNOSIS — I4891 Unspecified atrial fibrillation: Secondary | ICD-10-CM | POA: Diagnosis not present

## 2020-08-03 DIAGNOSIS — E119 Type 2 diabetes mellitus without complications: Secondary | ICD-10-CM | POA: Diagnosis not present

## 2020-08-03 DIAGNOSIS — I10 Essential (primary) hypertension: Secondary | ICD-10-CM | POA: Diagnosis not present

## 2020-08-03 DIAGNOSIS — M6281 Muscle weakness (generalized): Secondary | ICD-10-CM | POA: Diagnosis not present

## 2020-08-03 DIAGNOSIS — I4891 Unspecified atrial fibrillation: Secondary | ICD-10-CM | POA: Diagnosis not present

## 2020-08-03 DIAGNOSIS — E782 Mixed hyperlipidemia: Secondary | ICD-10-CM | POA: Diagnosis not present

## 2020-08-03 DIAGNOSIS — J449 Chronic obstructive pulmonary disease, unspecified: Secondary | ICD-10-CM | POA: Diagnosis not present

## 2020-08-03 DIAGNOSIS — I739 Peripheral vascular disease, unspecified: Secondary | ICD-10-CM | POA: Diagnosis not present

## 2020-08-03 DIAGNOSIS — N184 Chronic kidney disease, stage 4 (severe): Secondary | ICD-10-CM | POA: Diagnosis not present

## 2020-08-03 DIAGNOSIS — G4733 Obstructive sleep apnea (adult) (pediatric): Secondary | ICD-10-CM | POA: Diagnosis not present

## 2020-08-03 DIAGNOSIS — R2681 Unsteadiness on feet: Secondary | ICD-10-CM | POA: Diagnosis not present

## 2020-08-03 DIAGNOSIS — I5032 Chronic diastolic (congestive) heart failure: Secondary | ICD-10-CM | POA: Diagnosis not present

## 2020-08-05 ENCOUNTER — Inpatient Hospital Stay (HOSPITAL_COMMUNITY)
Admission: EM | Admit: 2020-08-05 | Discharge: 2020-08-07 | DRG: 064 | Disposition: A | Discharge status: Skilled Nursing Facility | Payer: Medicare Other | Source: Skilled Nursing Facility | Attending: Family Medicine | Admitting: Family Medicine

## 2020-08-05 ENCOUNTER — Observation Stay (HOSPITAL_COMMUNITY): Payer: Medicare Other

## 2020-08-05 ENCOUNTER — Encounter (HOSPITAL_COMMUNITY): Payer: Self-pay | Admitting: *Deleted

## 2020-08-05 ENCOUNTER — Other Ambulatory Visit: Payer: Self-pay

## 2020-08-05 ENCOUNTER — Emergency Department (HOSPITAL_COMMUNITY): Payer: Medicare Other

## 2020-08-05 DIAGNOSIS — R471 Dysarthria and anarthria: Secondary | ICD-10-CM | POA: Diagnosis present

## 2020-08-05 DIAGNOSIS — I4819 Other persistent atrial fibrillation: Secondary | ICD-10-CM | POA: Diagnosis present

## 2020-08-05 DIAGNOSIS — I69398 Other sequelae of cerebral infarction: Secondary | ICD-10-CM | POA: Diagnosis not present

## 2020-08-05 DIAGNOSIS — I2510 Atherosclerotic heart disease of native coronary artery without angina pectoris: Secondary | ICD-10-CM | POA: Diagnosis present

## 2020-08-05 DIAGNOSIS — H548 Legal blindness, as defined in USA: Secondary | ICD-10-CM | POA: Diagnosis present

## 2020-08-05 DIAGNOSIS — N400 Enlarged prostate without lower urinary tract symptoms: Secondary | ICD-10-CM | POA: Diagnosis present

## 2020-08-05 DIAGNOSIS — E1151 Type 2 diabetes mellitus with diabetic peripheral angiopathy without gangrene: Secondary | ICD-10-CM | POA: Diagnosis present

## 2020-08-05 DIAGNOSIS — I639 Cerebral infarction, unspecified: Secondary | ICD-10-CM | POA: Diagnosis not present

## 2020-08-05 DIAGNOSIS — Z6841 Body Mass Index (BMI) 40.0 and over, adult: Secondary | ICD-10-CM | POA: Diagnosis not present

## 2020-08-05 DIAGNOSIS — R739 Hyperglycemia, unspecified: Secondary | ICD-10-CM | POA: Diagnosis not present

## 2020-08-05 DIAGNOSIS — R29810 Facial weakness: Secondary | ICD-10-CM | POA: Diagnosis present

## 2020-08-05 DIAGNOSIS — I6389 Other cerebral infarction: Secondary | ICD-10-CM | POA: Diagnosis not present

## 2020-08-05 DIAGNOSIS — U071 COVID-19: Secondary | ICD-10-CM | POA: Diagnosis not present

## 2020-08-05 DIAGNOSIS — Z955 Presence of coronary angioplasty implant and graft: Secondary | ICD-10-CM

## 2020-08-05 DIAGNOSIS — N179 Acute kidney failure, unspecified: Secondary | ICD-10-CM | POA: Diagnosis present

## 2020-08-05 DIAGNOSIS — R29705 NIHSS score 5: Secondary | ICD-10-CM | POA: Diagnosis present

## 2020-08-05 DIAGNOSIS — I130 Hypertensive heart and chronic kidney disease with heart failure and stage 1 through stage 4 chronic kidney disease, or unspecified chronic kidney disease: Secondary | ICD-10-CM | POA: Diagnosis present

## 2020-08-05 DIAGNOSIS — N184 Chronic kidney disease, stage 4 (severe): Secondary | ICD-10-CM | POA: Diagnosis not present

## 2020-08-05 DIAGNOSIS — I63311 Cerebral infarction due to thrombosis of right middle cerebral artery: Secondary | ICD-10-CM | POA: Diagnosis not present

## 2020-08-05 DIAGNOSIS — I4891 Unspecified atrial fibrillation: Secondary | ICD-10-CM | POA: Diagnosis not present

## 2020-08-05 DIAGNOSIS — G8194 Hemiplegia, unspecified affecting left nondominant side: Secondary | ICD-10-CM | POA: Diagnosis present

## 2020-08-05 DIAGNOSIS — Z8673 Personal history of transient ischemic attack (TIA), and cerebral infarction without residual deficits: Secondary | ICD-10-CM | POA: Diagnosis not present

## 2020-08-05 DIAGNOSIS — R000 Tachycardia, unspecified: Secondary | ICD-10-CM | POA: Diagnosis not present

## 2020-08-05 DIAGNOSIS — I6381 Other cerebral infarction due to occlusion or stenosis of small artery: Principal | ICD-10-CM | POA: Diagnosis present

## 2020-08-05 DIAGNOSIS — E1122 Type 2 diabetes mellitus with diabetic chronic kidney disease: Secondary | ICD-10-CM | POA: Diagnosis present

## 2020-08-05 DIAGNOSIS — R29898 Other symptoms and signs involving the musculoskeletal system: Secondary | ICD-10-CM | POA: Diagnosis not present

## 2020-08-05 DIAGNOSIS — R410 Disorientation, unspecified: Secondary | ICD-10-CM | POA: Diagnosis not present

## 2020-08-05 DIAGNOSIS — E1159 Type 2 diabetes mellitus with other circulatory complications: Secondary | ICD-10-CM | POA: Diagnosis not present

## 2020-08-05 DIAGNOSIS — R531 Weakness: Secondary | ICD-10-CM | POA: Diagnosis not present

## 2020-08-05 DIAGNOSIS — E876 Hypokalemia: Secondary | ICD-10-CM | POA: Diagnosis present

## 2020-08-05 DIAGNOSIS — I10 Essential (primary) hypertension: Secondary | ICD-10-CM | POA: Diagnosis not present

## 2020-08-05 DIAGNOSIS — N3289 Other specified disorders of bladder: Secondary | ICD-10-CM | POA: Diagnosis present

## 2020-08-05 DIAGNOSIS — R29818 Other symptoms and signs involving the nervous system: Secondary | ICD-10-CM | POA: Diagnosis not present

## 2020-08-05 DIAGNOSIS — J449 Chronic obstructive pulmonary disease, unspecified: Secondary | ICD-10-CM | POA: Diagnosis present

## 2020-08-05 DIAGNOSIS — R0989 Other specified symptoms and signs involving the circulatory and respiratory systems: Secondary | ICD-10-CM | POA: Diagnosis not present

## 2020-08-05 DIAGNOSIS — Z7982 Long term (current) use of aspirin: Secondary | ICD-10-CM

## 2020-08-05 DIAGNOSIS — Z7901 Long term (current) use of anticoagulants: Secondary | ICD-10-CM

## 2020-08-05 DIAGNOSIS — N1831 Chronic kidney disease, stage 3a: Secondary | ICD-10-CM | POA: Diagnosis present

## 2020-08-05 DIAGNOSIS — G4733 Obstructive sleep apnea (adult) (pediatric): Secondary | ICD-10-CM | POA: Diagnosis present

## 2020-08-05 DIAGNOSIS — E7800 Pure hypercholesterolemia, unspecified: Secondary | ICD-10-CM | POA: Diagnosis present

## 2020-08-05 DIAGNOSIS — I6521 Occlusion and stenosis of right carotid artery: Secondary | ICD-10-CM | POA: Diagnosis not present

## 2020-08-05 DIAGNOSIS — I6621 Occlusion and stenosis of right posterior cerebral artery: Secondary | ICD-10-CM | POA: Diagnosis not present

## 2020-08-05 DIAGNOSIS — Z7401 Bed confinement status: Secondary | ICD-10-CM | POA: Diagnosis not present

## 2020-08-05 DIAGNOSIS — G459 Transient cerebral ischemic attack, unspecified: Secondary | ICD-10-CM | POA: Diagnosis not present

## 2020-08-05 DIAGNOSIS — I6602 Occlusion and stenosis of left middle cerebral artery: Secondary | ICD-10-CM | POA: Diagnosis not present

## 2020-08-05 DIAGNOSIS — Z8616 Personal history of COVID-19: Secondary | ICD-10-CM

## 2020-08-05 DIAGNOSIS — I5032 Chronic diastolic (congestive) heart failure: Secondary | ICD-10-CM | POA: Diagnosis present

## 2020-08-05 DIAGNOSIS — E785 Hyperlipidemia, unspecified: Secondary | ICD-10-CM | POA: Diagnosis present

## 2020-08-05 DIAGNOSIS — R0902 Hypoxemia: Secondary | ICD-10-CM | POA: Diagnosis not present

## 2020-08-05 DIAGNOSIS — M2550 Pain in unspecified joint: Secondary | ICD-10-CM | POA: Diagnosis not present

## 2020-08-05 DIAGNOSIS — I959 Hypotension, unspecified: Secondary | ICD-10-CM | POA: Diagnosis not present

## 2020-08-05 DIAGNOSIS — E119 Type 2 diabetes mellitus without complications: Secondary | ICD-10-CM | POA: Diagnosis not present

## 2020-08-05 LAB — COMPREHENSIVE METABOLIC PANEL
ALT: 27 U/L (ref 0–44)
AST: 27 U/L (ref 15–41)
Albumin: 3.4 g/dL — ABNORMAL LOW (ref 3.5–5.0)
Alkaline Phosphatase: 67 U/L (ref 38–126)
Anion gap: 9 (ref 5–15)
BUN: 14 mg/dL (ref 8–23)
CO2: 30 mmol/L (ref 22–32)
Calcium: 9.5 mg/dL (ref 8.9–10.3)
Chloride: 101 mmol/L (ref 98–111)
Creatinine, Ser: 1.47 mg/dL — ABNORMAL HIGH (ref 0.61–1.24)
GFR, Estimated: 47 mL/min — ABNORMAL LOW (ref >60)
Glucose, Bld: 184 mg/dL — ABNORMAL HIGH (ref 70–99)
Potassium: 3.9 mmol/L (ref 3.5–5.1)
Sodium: 140 mmol/L (ref 135–145)
Total Bilirubin: 0.8 mg/dL (ref 0.3–1.2)
Total Protein: 6.3 g/dL — ABNORMAL LOW (ref 6.5–8.1)

## 2020-08-05 LAB — CBC
HCT: 53.5 % — ABNORMAL HIGH (ref 39.0–52.0)
Hemoglobin: 16.7 g/dL (ref 13.0–17.0)
MCH: 29.1 pg (ref 26.0–34.0)
MCHC: 31.2 g/dL (ref 30.0–36.0)
MCV: 93.2 fL (ref 80.0–100.0)
Platelets: 173 10*3/uL (ref 150–400)
RBC: 5.74 MIL/uL (ref 4.22–5.81)
RDW: 14 % (ref 11.5–15.5)
WBC: 9.1 10*3/uL (ref 4.0–10.5)
nRBC: 0 % (ref 0.0–0.2)

## 2020-08-05 LAB — D-DIMER, QUANTITATIVE: D-Dimer, Quant: 0.45 ug/mL-FEU (ref 0.00–0.50)

## 2020-08-05 LAB — FERRITIN: Ferritin: 75 ng/mL (ref 24–336)

## 2020-08-05 LAB — I-STAT CHEM 8, ED
BUN: 16 mg/dL (ref 8–23)
Calcium, Ion: 1.21 mmol/L (ref 1.15–1.40)
Chloride: 101 mmol/L (ref 98–111)
Creatinine, Ser: 1.3 mg/dL — ABNORMAL HIGH (ref 0.61–1.24)
Glucose, Bld: 174 mg/dL — ABNORMAL HIGH (ref 70–99)
HCT: 53 % — ABNORMAL HIGH (ref 39.0–52.0)
Hemoglobin: 18 g/dL — ABNORMAL HIGH (ref 13.0–17.0)
Potassium: 3.9 mmol/L (ref 3.5–5.1)
Sodium: 141 mmol/L (ref 135–145)
TCO2: 28 mmol/L (ref 22–32)

## 2020-08-05 LAB — DIFFERENTIAL
Abs Immature Granulocytes: 0.03 10*3/uL (ref 0.00–0.07)
Basophils Absolute: 0 10*3/uL (ref 0.0–0.1)
Basophils Relative: 0 %
Eosinophils Absolute: 0.4 10*3/uL (ref 0.0–0.5)
Eosinophils Relative: 4 %
Immature Granulocytes: 0 %
Lymphocytes Relative: 15 %
Lymphs Abs: 1.4 10*3/uL (ref 0.7–4.0)
Monocytes Absolute: 0.7 10*3/uL (ref 0.1–1.0)
Monocytes Relative: 8 %
Neutro Abs: 6.6 10*3/uL (ref 1.7–7.7)
Neutrophils Relative %: 73 %

## 2020-08-05 LAB — C-REACTIVE PROTEIN: CRP: 0.7 mg/dL (ref <1.0)

## 2020-08-05 LAB — PROTIME-INR
INR: 1.2 (ref 0.8–1.2)
Prothrombin Time: 14.5 seconds (ref 11.4–15.2)

## 2020-08-05 LAB — APTT: aPTT: 36 seconds (ref 24–36)

## 2020-08-05 LAB — CK: Total CK: 77 U/L (ref 49–397)

## 2020-08-05 LAB — SARS CORONAVIRUS 2 BY RT PCR (HOSPITAL ORDER, PERFORMED IN ~~LOC~~ HOSPITAL LAB): SARS Coronavirus 2: POSITIVE — AB

## 2020-08-05 LAB — LACTATE DEHYDROGENASE: LDH: 145 U/L (ref 98–192)

## 2020-08-05 LAB — CBG MONITORING, ED: Glucose-Capillary: 175 mg/dL — ABNORMAL HIGH (ref 70–99)

## 2020-08-05 LAB — LACTIC ACID, PLASMA: Lactic Acid, Venous: 1.6 mmol/L (ref 0.5–1.9)

## 2020-08-05 MED ORDER — APIXABAN 2.5 MG PO TABS
2.5000 mg | ORAL_TABLET | Freq: Two times a day (BID) | ORAL | Status: DC
  Administered 2020-08-05 – 2020-08-06 (×2): 2.5 mg via ORAL
  Filled 2020-08-05 (×2): qty 1

## 2020-08-05 MED ORDER — INSULIN ASPART 100 UNIT/ML ~~LOC~~ SOLN
0.0000 [IU] | Freq: Three times a day (TID) | SUBCUTANEOUS | Status: DC
  Administered 2020-08-06 – 2020-08-07 (×4): 2 [IU] via SUBCUTANEOUS
  Administered 2020-08-07: 1 [IU] via SUBCUTANEOUS

## 2020-08-05 MED ORDER — TAMSULOSIN HCL 0.4 MG PO CAPS
0.4000 mg | ORAL_CAPSULE | Freq: Every day | ORAL | Status: DC
  Administered 2020-08-06: 0.4 mg via ORAL
  Filled 2020-08-05: qty 1

## 2020-08-05 MED ORDER — ATORVASTATIN CALCIUM 40 MG PO TABS
40.0000 mg | ORAL_TABLET | Freq: Every day | ORAL | Status: DC
  Administered 2020-08-05 – 2020-08-06 (×2): 40 mg via ORAL
  Filled 2020-08-05 (×2): qty 1

## 2020-08-05 MED ORDER — LATANOPROST 0.005 % OP SOLN
1.0000 [drp] | Freq: Every day | OPHTHALMIC | Status: DC
  Administered 2020-08-06: 1 [drp] via OPHTHALMIC
  Filled 2020-08-05: qty 2.5

## 2020-08-05 MED ORDER — ENOXAPARIN SODIUM 40 MG/0.4ML ~~LOC~~ SOLN: 40.0000 mg | SUBCUTANEOUS | Status: DC

## 2020-08-05 MED ORDER — SODIUM CHLORIDE 0.9% FLUSH
3.0000 mL | Freq: Once | INTRAVENOUS | Status: DC
Start: 2020-08-05 — End: 2020-08-07

## 2020-08-05 MED ORDER — OXYBUTYNIN CHLORIDE 5 MG PO TABS
5.0000 mg | ORAL_TABLET | Freq: Three times a day (TID) | ORAL | Status: DC | PRN
  Filled 2020-08-05: qty 1

## 2020-08-05 MED ORDER — GABAPENTIN 100 MG PO CAPS
200.0000 mg | ORAL_CAPSULE | Freq: Every day | ORAL | Status: DC
  Administered 2020-08-05 – 2020-08-06 (×2): 200 mg via ORAL
  Filled 2020-08-05 (×2): qty 2

## 2020-08-05 MED ORDER — ENOXAPARIN SODIUM 80 MG/0.8ML ~~LOC~~ SOLN: 65.0000 mg | SUBCUTANEOUS | Status: DC

## 2020-08-05 MED ORDER — ASPIRIN EC 81 MG PO TBEC
81.0000 mg | DELAYED_RELEASE_TABLET | Freq: Every day | ORAL | Status: DC
  Administered 2020-08-06 – 2020-08-07 (×2): 81 mg via ORAL
  Filled 2020-08-05 (×2): qty 1

## 2020-08-05 NOTE — H&P (Signed)
[facility] Admission History and Physical Service Pager: [phone]  Patient name: Reginald [name] Medical record number: [id number] Date of birth: 1936-01-20 Age: 85 y.o. Gender: male  Primary Care Provider: [name], MD Consultants: Neurology Code Status: Full  Preferred Emergency Contact: Jedediah Noda son - [phone] Clarene Reamer - [phone]  Chief Complaint: Left sided weakness  Assessment and Plan: Reginald [name] is a 85 y.o. male presenting with left sided weakness and concern for stroke . PMH is significant for CAD, CHF, morbid obesity, CKD, COPD, CVA, OSA, hypertension, hypercholesterolemia, diabetes.  CVA Patient awoke this morning with new onset left arm and leg weakness.  Has history of stroke and was admitted in 62/95 for embolic event from right carotid artery that resulted in vision loss of right eye.  Also endorses some blurry vision in left eye.  VSS.  Alert and Oriented x3 and understands why here in hospital.  On Neuro exam right sided UE & LE 5/5.  Left UE- 4/5.  Left LE- 3/5  Left Foot- 0/5.  Sensation intact throughout.  Slight droop on right side of mouth.  CN 2-12 otherwise intact.  Physical exam otherwise normal.  CBC, electrolytes, PT, and aPTT normal. No acute abnormality no change from the prior CT 06/09/2020 and moderate atrophy and chronic microvascular ischemic change in the white matter.  Neuro consulted for stroke work-up.  Given normal head CT, new embolic event likely cause of symptoms.  Plan to obtain MRI wqith possible MRA head.     - Admit to FPTS - Neuro following, appreciate recs - MRI brain without contrast. -Will considerTTE. -f/u HbA1c - f/u Neuro recs for anticoagulation  - SBP goal - Permissive hypertension first 24 h < 220/110. Hold home Metoprolol for now - Telemetry monitoring for arrhythmia. -f/u bedsideSwallow screen. -RecommendPT/OT/SLP consult. - f/u UA with UCx, CXR, CK, serum  lactate.  COVID-19 Patient currently asymptomatic.  Vaccinated x3  Satting well on RA Roommate was found to be positive on Monday.  Will not start Remdesivir or Decadron at this time.  - Continuous O2 monitoring - Maintain O2 levels >90%   Type 2 diabetes: Last A1C on 05/28/20 was 8.1.  Home medications include insulin sliding scale, Trulicity 3 mg weekly, on Mondays and Prandin 0.5 daily. -Monitor CBGs -Sensitive sliding scale   Atrial fibrillation Current heart rate 92.  Irregular rhthym but rate well controlled.   Home medications include Eliquis and Metoprolol. - Continue Eliquis - Hold Metoprolol  HFpEF Most recent echo 06/10/2020 with ejection fraction 55-60% with moderate left ventricular hypertrophy and left ventricular diastolic parameters being indeterminate.  At previous echoes the patient has had grade 2 diastolic dysfunction.  Home medications include metoprolol succinate 12.5 mg daily. - Hold home Metoprolol for permissive hypertension  History of CAD Patient reports MI back in 1997 with stent placement.  Denies repeat even since.  On Aspirin 81 mg daily. - Will hold home Aspirin prior to Neuro recs  CKD stage IIIa Current creatinine 1.30, baseline appears to range somewhere between 1.5 and 2 per chart review.  Hypertension Current BP 153/90.  Home medications include metoprolol succinate 12.5 mg daily. - Neuro Recs for SBP goal  - Permissive hypertension first 24 h < 220/110.  - Hold home Metoprolol  Hyperlipidemia Last lipid panel on 06/11/20.  LDL-51.  On Atorvastatin 40 mg at home. - Continue home Atorvstatin  BPH  bladder spasms Home medication include Flomax 0.4 mg/day as well as oxybutynin.  Has urine incoontinence at baseline. - Continue home medications   FEN/GI: NPO for swallow study Prophylaxis: Lovenox  Disposition: Progressive  History of Present Illness:  Reginald [name] is a 85 y.o. male presenting with left sided weakness.  He noticed  weakness in his left arm and leg earlier today when he woke up. He tried to get out of bed but fell due to leg weakness. His arm and leg weakness has been consistent since this morning.  He has history of blindness in right eye and thinks he may have some increased blurriness in his left eye.  Has not had a bowel movement today. Has urine incontinence at baseline.    He's lived at [facility] facility for 3 years and did have a stroke 3-4 weeks ago per his report.  He is alert to self, says its Jan 2021, and knows he is in [place] for concern for stroke. He says hes having trouble swallowing since this morning, but believes this is due to sore throat.  His roommate tested positive for Covid yesterday. His test on Monday was negative and hes had all 3 covid shots.   Tobacco:No Alcohol:No Drugs:No  Review Of Systems: Per HPI with the following additions:   Review of Systems  HENT: Positive for sore throat.   Eyes: Positive for visual disturbance.  Respiratory: Positive for shortness of breath.   Cardiovascular: Negative for chest pain.  Gastrointestinal: Negative for abdominal pain and diarrhea.  Neurological: Negative for headaches.     Patient Active Problem List   Diagnosis Date Noted  . Stroke ([place]) 08/05/2020  . Vision loss of right eye 06/09/2020  . Acute on chronic renal failure ([place]) 12/19/2018  . Acute renal failure superimposed on stage 4 chronic kidney disease ([place]) 12/19/2018  . AKI (acute kidney injury) ([place]) 11/26/2018  . HCAP (healthcare-associated pneumonia) 11/15/2018  . Acute respiratory disease due to COVID-19 virus 11/15/2018  . Chronic venous insufficiency 10/04/2017  . Venous stasis ulcers of both lower extremities ([place]) 10/04/2017  . Nonspecific chest pain 10/27/2016  . Morbid obesity ([place]) 05/02/2016  . Lymphedema 05/02/2016  . Abnormal laboratory test result 10/15/2015  . Cough 10/08/2015  . Hematuria 09/21/2015  . Hypokalemia 08/13/2015  . HTN  (hypertension) 08/13/2015  . Multiple open wounds of lower leg 07/22/2015  . CKD (chronic kidney disease) stage 4, GFR 15-29 ml/min (H[facility]) 07/22/2015  . Cellulitis of leg, right 06/28/2015  . ARF (acute renal failure) ([place]) 06/28/2015  . Hypertensive heart disease with CHF (congestive heart failure) ([place]) 05/19/2015  . CAD (coronary artery disease), native coronary artery 05/19/2015  . Hyperlipidemia 05/19/2015  . Vitamin D deficiency 05/19/2015  . Pressure ulcer 05/16/2015  . Diastolic dysfunction with chronic heart failure ([place]) 05/08/2015  . Blisters of multiple sites 05/08/2015  . Type 2 diabetes mellitus ([place])   . Obesity, unspecified   . OSA (obstructive sleep apnea)   . Atrial fibrillation ([place])   . Secondary DM with CKD stage 4 and hypertension ([place])   . Glaucoma   . Cellulitis 05/07/2015    Past Medical History: Past Medical History:  Diagnosis Date  . Anteroseptal myocardial infarction ([place])   . Carotid artery occlusion   . CHF (congestive heart failure) ([place])   . Chronic kidney disease   . COPD (chronic obstructive pulmonary disease) ([place])   . Coronary atherosclerosis of unspecified type of vessel, native or graft    a. s/p prior PCI to LAD in 1997  . Diverticulosis   .  ED (erectile dysfunction)   . Foley catheter in place   . Glaucoma   . Hematuria   . History of 2019 novel coronavirus disease (COVID-19) 11/2018  . Hydronephrosis, bilateral   . Hydroureter   . Hypercholesteremia   . Hypertensive renal disease   . Hypogonadism male   . Hypokalemia   . Obesity hypoventilation syndrome ([place])   . Obesity, unspecified   . OSA (obstructive sleep apnea)   . Peripheral vascular disease ([place])   . Permanent atrial fibrillation ([place])   . Phimosis   . Pneumonia   . Rhabdomyolysis   . Tremor   . Type II or unspecified type diabetes mellitus without mention of complication, not stated as uncontrolled   . Unspecified essential hypertension   . Urinary incontinence    . Urinary retention   . Vestibulopathy     Past Surgical History: Past Surgical History:  Procedure Laterality Date  . APPENDECTOMY  1985  . CARDIAC CATHETERIZATION    . CHOLECYSTECTOMY  06/2000  . CIRCUMCISION N/A 05/28/2020   Procedure: CIRCUMCISION ADULT;  Surgeon: Franchot Gallo, MD;  Location: [facility]S;  Service: Urology;  Laterality: N/A;  . CORONARY ANGIOPLASTY  08/14/1995   stent placement to LAD   . DORSAL SLIT N/A 05/28/2020   Procedure: DORSAL SLIT;  Surgeon: Franchot Gallo, MD;  Location: [facility];  Service: Urology;  Laterality: N/A;  11 MINS  . ENDOVENOUS ABLATION SAPHENOUS VEIN W/ LASER Left 02/21/2018   endovenous laser ablation L GSV by Ruta Hinds MD   . [place] Right 1985  . KNEE ARTHROSCOPY Left 1991  . LUMBAR FUSION    . NOSE SURGERY  1970s   Per Dr. Terrence Dupont [facility] in pt chart  . TRANSCAROTID ARTERY REVASCULARIZATION Right 06/12/2020   Procedure: RIGHT TRANSCAROTID ARTERY REVASCULARIZATION;  Surgeon: Serafina Mitchell, MD;  Location: [facility] OR;  Service: Vascular;  Laterality: Right;    Social History: Social History   Tobacco Use  . Smoking status: Never Smoker  . Smokeless tobacco: Never Used  Vaping Use  . Vaping Use: Never used  Substance Use Topics  . Alcohol use: No    Alcohol/week: 0.0 standard drinks  . Drug use: No   Additional social history: Please also refer to relevant sections of EMR.  Family History: Family History  Problem Relation Age of Onset  . COPD Mother   . Heart disease Mother   . Diabetes Father   . Heart disease Father   . Diabetes Sister   . Heart disease Brother   . Heart disease Brother   . Breast cancer Maternal Aunt   . Diabetes Paternal Grandmother   . Colon cancer Maternal Aunt   . Ovarian cancer Daughter   . Glaucoma Other      Allergies and Medications: Allergies  Allergen Reactions  . Adhesive [Tape] Rash   No current facility-administered medications on file prior to encounter.    Current Outpatient Medications on File Prior to Encounter  Medication Sig Dispense Refill  . acetaminophen (TYLENOL) 325 MG tablet Take 650 mg by mouth every 4 (four) hours as needed for moderate pain.     Marland Kitchen albuterol (VENTOLIN HFA) 108 (90 Base) MCG/ACT inhaler Inhale 2 puffs into the lungs 2 (two) times daily as needed for wheezing or shortness of breath.    Marland Kitchen apixaban (ELIQUIS) 2.5 MG TABS tablet Take 2.5 mg by mouth 2 (two) times daily.    Marland Kitchen aspirin EC 81 MG EC tablet Take 1  tablet (81 mg total) by mouth daily. Swallow whole. 30 tablet 11  . atorvastatin (LIPITOR) 40 MG tablet Take 40 mg by mouth at bedtime.    . cetirizine (ZYRTEC) 10 MG tablet Take 5 mg by mouth at bedtime.     . cholecalciferol (VITAMIN D) 25 MCG (1000 UNIT) tablet Take 2,000 Units by mouth daily.    Marland Kitchen docusate sodium (COLACE) 100 MG capsule Take 100 mg by mouth at bedtime.    . Dulaglutide (TRULICITY) 3 YT/0.1SW SOPN Inject 3 mg into the skin once a week. Mondays    . gabapentin (NEURONTIN) 100 MG capsule Take 200 mg by mouth at bedtime.     . hydroxypropyl methylcellulose / hypromellose (ISOPTO TEARS / GONIOVISC) 2.5 % ophthalmic solution Place 1 drop into both eyes 4 (four) times daily.    . insulin lispro (HUMALOG) 100 UNIT/ML injection Inject 0-14 Units into the skin See admin instructions. Inject 0-14 units subcutaneously twice daily per sliding scale: CBG 0-200 0 units, 201-250 2 units, 251-300 4 units, 301-350 6 units, 351-400 8 units, 401-450 10 units, 451-500 12 units, >500 14 units If blood sugar is > 400 or < 80 call pec triage    . latanoprost (XALATAN) 0.005 % ophthalmic solution Place 1 drop into both eyes at bedtime.    . magnesium oxide (MAG-OX) 400 MG tablet Take 400 mg by mouth daily.    . Melatonin 5 MG TABS Take 5 mg by mouth at bedtime.    . Menthol, Topical Analgesic, (BIOFREEZE) 4 % GEL Apply 1 application topically 2 (two) times daily as needed (forehead and left arm pain).    . metoprolol  succinate (TOPROL XL) 25 MG 24 hr tablet Take 0.5 tablets (12.5 mg total) by mouth daily. 15 tablet 11  . nitroGLYCERIN (NITROSTAT) 0.4 MG SL tablet Place 0.4 mg under the tongue every 5 (five) minutes as needed for chest pain.    Marland Kitchen oxybutynin (DITROPAN) 5 MG tablet Take 5 mg by mouth every 8 (eight) hours as needed for bladder spasms.     . polyethylene glycol (MIRALAX / GLYCOLAX) 17 g packet Take 17 g by mouth daily. Mix in 6 oz fluid and drink    . Skin Protectants, Misc. (EUCERIN) cream Apply 1 application topically 2 (two) times a day. For itching    . tamsulosin (FLOMAX) 0.4 MG CAPS capsule Take 1 capsule (0.4 mg total) by mouth daily after supper. 30 capsule 0  . vitamin B-12 (CYANOCOBALAMIN) 1000 MCG tablet Take 1,000 mcg by mouth daily.      Objective: BP (!) 153/90 (BP Location: Right Arm)   Pulse (!) 52   Temp 97.9 F (36.6 C) (Oral)   Resp (!) 23   Ht 5\' 11"  (1.803 m)   Wt (!) 138.8 kg   SpO2 96%   BMI 42.68 kg/m  Exam:  Physical Exam Constitutional:      General: He is not in acute distress.    Appearance: Normal appearance.  HENT:     Head: Normocephalic and atraumatic.     Mouth/Throat:     Mouth: Mucous membranes are moist.  Eyes:     General: Visual field deficit present.     Extraocular Movements: Extraocular movements intact.     Pupils: Pupils are equal, round, and reactive to light.  Cardiovascular:     Rate and Rhythm: Normal rate. Rhythm irregular.  Pulmonary:     Effort: Pulmonary effort is normal.  Abdominal:  General: Abdomen is flat. There is no distension.     Palpations: Abdomen is soft.     Tenderness: There is no abdominal tenderness.  Musculoskeletal:     Comments: Patient legs wrapped up from ankles to knees due to pain  Skin:    General: Skin is warm.     Capillary Refill: Capillary refill takes less than 2 seconds.  Neurological:     Mental Status: He is alert and oriented to person, place, and time.     Cranial Nerves: No cranial  nerve deficit.     Comments: Right sided UE & LE 5/5 Left UE- 4/5 Left LE- 3/5 Left Foot- 0/5 Sensation intact throughout Slight droop on right side of mouth  Psychiatric:        Mood and Affect: Mood normal.        Behavior: Behavior normal.        Thought Content: Thought content normal.     Labs and Imaging: CBC BMET  Recent Labs  Lab 08/05/20 1338 08/05/20 1342  WBC 9.1  --   HGB 16.7 18.0*  HCT 53.5* 53.0*  PLT 173  --    Recent Labs  Lab 08/05/20 1338 08/05/20 1342  NA 140 141  K 3.9 3.9  CL 101 101  CO2 30  --   BUN 14 16  CREATININE 1.47* 1.30*  GLUCOSE 184* 174*  CALCIUM 9.5  --      EKG: My own interpretation (not copied from electronic read)    A-Fib, rate controlled  Lurline Del, DO 08/05/2020, 2:46 PM PGY-1, [facility] pager: [phone], text pages welcome

## 2020-08-05 NOTE — ED Provider Notes (Signed)
[facility] EMERGENCY DEPARTMENT Provider Note   CSN: [id number] Arrival date & time: 08/05/20  1329  An emergency department physician performed an initial assessment on this suspected stroke patient at 30.  History Chief Complaint  Patient presents with  . Code Stroke    Reginald [name] is a 85 y.o. male history of CAD, CHF, morbid obesity, CKD, COPD, CVA, OSA, hypertension, hypercholesterolemia, diabetes.  On Eliquis  Patient arrives as a code stroke via EMS today from [facility].  He was met by Dr. Dina Rich at the bridge along with neurology team.  Patient was taken immediately to the CT scanner.  Pertinent history is patient had right carotid endarterectomy around 1 month ago.  He came in for left-sided weakness onset sometime today, unclear last known well but sometime this morning.  Noted to have left arm and leg weakness on exam.  Additionally patient reports his roommate tested positive for Covid yesterday and he reports a mild sore throat today.  CT scan showed no acute abnormalities.  Patient was unable to undergo CT angiograms due to chronic kidney disease.  Patient not TPA candidate per neurology. - On my evaluation patient awake alert no acute distress.  He reports that he noticed weakness of his left side today when physical therapy tried to get him out of bed.  He reports that when he tried to stand he stumbled falling towards his left side, he was assisted by physical therapy and did not fall.  He denies any head injury or pain of his neck back chest abdomen or extremities.  He denies any additional concerns aside from his weakness today.  Denies any recent fever/chills, nausea/vomiting, cough, chest pain/shortness of breath, diarrhea or any additional concerns.  HPI     Past Medical History:  Diagnosis Date  . Anteroseptal myocardial infarction ([place])   . Carotid artery occlusion   . CHF (congestive heart failure) ([place])   . Chronic kidney disease    . COPD (chronic obstructive pulmonary disease) ([place])   . Coronary atherosclerosis of unspecified type of vessel, native or graft    a. s/p prior PCI to LAD in 1997  . Diverticulosis   . ED (erectile dysfunction)   . Foley catheter in place   . Glaucoma   . Hematuria   . History of 2019 novel coronavirus disease (COVID-19) 11/2018  . Hydronephrosis, bilateral   . Hydroureter   . Hypercholesteremia   . Hypertensive renal disease   . Hypogonadism male   . Hypokalemia   . Obesity hypoventilation syndrome ([place])   . Obesity, unspecified   . OSA (obstructive sleep apnea)   . Peripheral vascular disease ([place])   . Permanent atrial fibrillation ([place])   . Phimosis   . Pneumonia   . Rhabdomyolysis   . Tremor   . Type II or unspecified type diabetes mellitus without mention of complication, not stated as uncontrolled   . Unspecified essential hypertension   . Urinary incontinence   . Urinary retention   . Vestibulopathy     Patient Active Problem List   Diagnosis Date Noted  . Stroke ([place]) 08/05/2020  . Vision loss of right eye 06/09/2020  . Acute on chronic renal failure ([place]) 12/19/2018  . Acute renal failure superimposed on stage 4 chronic kidney disease ([place]) 12/19/2018  . AKI (acute kidney injury) ([place]) 11/26/2018  . HCAP (healthcare-associated pneumonia) 11/15/2018  . Acute respiratory disease due to COVID-19 virus 11/15/2018  . Chronic venous insufficiency 10/04/2017  .  Venous stasis ulcers of both lower extremities ([place]) 10/04/2017  . Nonspecific chest pain 10/27/2016  . Morbid obesity ([place]) 05/02/2016  . Lymphedema 05/02/2016  . Abnormal laboratory test result 10/15/2015  . Cough 10/08/2015  . Hematuria 09/21/2015  . Hypokalemia 08/13/2015  . HTN (hypertension) 08/13/2015  . Multiple open wounds of lower leg 07/22/2015  . CKD (chronic kidney disease) stage 4, GFR 15-29 ml/min (HCC) 07/22/2015  . Cellulitis of leg, right 06/28/2015  . ARF (acute renal failure) ([place])  06/28/2015  . Hypertensive heart disease with CHF (congestive heart failure) ([place]) 05/19/2015  . CAD (coronary artery disease), native coronary artery 05/19/2015  . Hyperlipidemia 05/19/2015  . Vitamin D deficiency 05/19/2015  . Pressure ulcer 05/16/2015  . Diastolic dysfunction with chronic heart failure ([place]) 05/08/2015  . Blisters of multiple sites 05/08/2015  . Type 2 diabetes mellitus ([place])   . Obesity, unspecified   . OSA (obstructive sleep apnea)   . Atrial fibrillation ([place])   . Secondary DM with CKD stage 4 and hypertension ([place])   . Glaucoma   . Cellulitis 05/07/2015    Past Surgical History:  Procedure Laterality Date  . APPENDECTOMY  1985  . CARDIAC CATHETERIZATION    . CHOLECYSTECTOMY  06/2000  . CIRCUMCISION N/A 05/28/2020   Procedure: CIRCUMCISION ADULT;  Surgeon: Franchot Gallo, MD;  Location: [facility] ORS;  Service: Urology;  Laterality: N/A;  . CORONARY ANGIOPLASTY  08/14/1995   stent placement to LAD   . DORSAL SLIT N/A 05/28/2020   Procedure: DORSAL SLIT;  Surgeon: Franchot Gallo, MD;  Location: [facility] ORS;  Service: Urology;  Laterality: N/A;  66 MINS  . ENDOVENOUS ABLATION SAPHENOUS VEIN W/ LASER Left 02/21/2018   endovenous laser ablation L GSV by Ruta Hinds MD   . [place] Right 1985  . KNEE ARTHROSCOPY Left 1991  . LUMBAR FUSION    . NOSE SURGERY  1970s   Per Dr. Terrence Dupont [facility] in pt chart  . TRANSCAROTID ARTERY REVASCULARIZATION Right 06/12/2020   Procedure: RIGHT TRANSCAROTID ARTERY REVASCULARIZATION;  Surgeon: Serafina Mitchell, MD;  Location: [facility] OR;  Service: Vascular;  Laterality: Right;       Family History  Problem Relation Age of Onset  . COPD Mother   . Heart disease Mother   . Diabetes Father   . Heart disease Father   . Diabetes Sister   . Heart disease Brother   . Heart disease Brother   . Breast cancer Maternal Aunt   . Diabetes Paternal Grandmother   . Colon cancer Maternal Aunt   . Ovarian cancer Daughter   .  Glaucoma Other     Social History   Tobacco Use  . Smoking status: Never Smoker  . Smokeless tobacco: Never Used  Vaping Use  . Vaping Use: Never used  Substance Use Topics  . Alcohol use: No    Alcohol/week: 0.0 standard drinks  . Drug use: No    Home Medications Prior to Admission medications   Medication Sig Start Date End Date Taking? Authorizing Provider  acetaminophen (TYLENOL) 325 MG tablet Take 650 mg by mouth every 4 (four) hours as needed for moderate pain.     [provider]  albuterol (VENTOLIN HFA) 108 (90 Base) MCG/ACT inhaler Inhale 2 puffs into the lungs 2 (two) times daily as needed for wheezing or shortness of breath.    [provider]  apixaban (ELIQUIS) 2.5 MG TABS tablet Take 2.5 mg by mouth 2 (two) times daily.  [provider]  aspirin EC 81 MG EC tablet Take 1 tablet (81 mg total) by mouth daily. Swallow whole. 06/13/20   Hosie Poisson, MD  atorvastatin (LIPITOR) 40 MG tablet Take 40 mg by mouth at bedtime.    [provider]  cetirizine (ZYRTEC) 10 MG tablet Take 5 mg by mouth at bedtime.     [provider]  cholecalciferol (VITAMIN D) 25 MCG (1000 UNIT) tablet Take 2,000 Units by mouth daily.    [provider]  docusate sodium (COLACE) 100 MG capsule Take 100 mg by mouth at bedtime.    [provider]  Dulaglutide (TRULICITY) 3 TD/4.2AJ SOPN Inject 3 mg into the skin once a week. Mondays    [provider]  gabapentin (NEURONTIN) 100 MG capsule Take 200 mg by mouth at bedtime.     [provider]  hydroxypropyl methylcellulose / hypromellose (ISOPTO TEARS / GONIOVISC) 2.5 % ophthalmic solution Place 1 drop into both eyes 4 (four) times daily.    [provider]  insulin lispro (HUMALOG) 100 UNIT/ML injection Inject 0-14 Units into the skin See admin instructions. Inject 0-14 units subcutaneously twice daily per sliding scale: CBG 0-200 0 units, 201-250 2 units,  251-300 4 units, 301-350 6 units, 351-400 8 units, 401-450 10 units, 451-500 12 units, >500 14 units If blood sugar is > 400 or < 80 call pec triage    [provider]  latanoprost (XALATAN) 0.005 % ophthalmic solution Place 1 drop into both eyes at bedtime. 01/03/17   [provider]  magnesium oxide (MAG-OX) 400 MG tablet Take 400 mg by mouth daily.    [provider]  Melatonin 5 MG TABS Take 5 mg by mouth at bedtime.    [provider]  Menthol, Topical Analgesic, (BIOFREEZE) 4 % GEL Apply 1 application topically 2 (two) times daily as needed (forehead and left arm pain).    [provider]  metoprolol succinate (TOPROL XL) 25 MG 24 hr tablet Take 0.5 tablets (12.5 mg total) by mouth daily. 06/14/20 06/14/21  Hosie Poisson, MD  nitroGLYCERIN (NITROSTAT) 0.4 MG SL tablet Place 0.4 mg under the tongue every 5 (five) minutes as needed for chest pain.    [provider]  oxybutynin (DITROPAN) 5 MG tablet Take 5 mg by mouth every 8 (eight) hours as needed for bladder spasms.  05/07/20   [provider]  polyethylene glycol (MIRALAX / GLYCOLAX) 17 g packet Take 17 g by mouth daily. Mix in 6 oz fluid and drink    [provider]  Skin Protectants, Misc. (EUCERIN) cream Apply 1 application topically 2 (two) times a day. For itching    [provider]  tamsulosin (FLOMAX) 0.4 MG CAPS capsule Take 1 capsule (0.4 mg total) by mouth daily after supper. 12/25/18   Kayleen Memos, DO  vitamin B-12 (CYANOCOBALAMIN) 1000 MCG tablet Take 1,000 mcg by mouth daily.    [provider]    Allergies    Adhesive [tape]  Review of Systems   Review of Systems Ten systems are reviewed and are negative for acute change except as noted in the HPI  Physical Exam Updated Vital Signs BP (!) 153/90 (BP Location: Right Arm)   Pulse (!) 52   Temp 97.9 F (36.6 C) (Oral)   Resp (!) 23   Ht '5\' 11"'  (1.803 m)   Wt (!) 138.8 kg   SpO2  96%   BMI 42.68 kg/m   Physical  Exam Constitutional:      General: He is not in acute distress.    Appearance: Normal appearance. He is well-developed. He is obese. He is not ill-appearing or diaphoretic.  HENT:     Head: Normocephalic and atraumatic.     Mouth/Throat:     Comments: Minimal posterior oropharynx erythema without swelling or exudate.  The patient has normal phonation and is in control of secretions. No stridor.  Midline uvula without edema. Soft palate rises symmetrically. Tongue protrusion is normal, floor of mouth is soft. No trismus. No creptius on neck palpation. No gingival erythema or fluctuance noted. Mucus membranes moist. Eyes:     General: Vision grossly intact. Gaze aligned appropriately.     Pupils: Pupils are equal, round, and reactive to light.  Neck:     Trachea: Trachea and phonation normal.  Cardiovascular:     Rate and Rhythm: Normal rate and regular rhythm.  Pulmonary:     Effort: Pulmonary effort is normal. No respiratory distress.     Breath sounds: Normal breath sounds.  Abdominal:     General: There is no distension.     Palpations: Abdomen is soft.     Tenderness: There is no abdominal tenderness. There is no guarding or rebound.  Musculoskeletal:        General: Normal range of motion.     Cervical back: Normal range of motion.  Skin:    General: Skin is warm and dry.  Neurological:     Mental Status: He is alert.     GCS: GCS eye subscore is 4. GCS verbal subscore is 5. GCS motor subscore is 6.     Comments: Speech is clear and goal oriented, follows commands Major Cranial nerves without deficit, no facial droop 3/5 strength left upper extremity compared to right, unable to lift left lower extremity Sensation normal to light and sharp touch  Psychiatric:        Behavior: Behavior normal.     ED Results / Procedures / Treatments   Labs (all labs ordered are listed, but only abnormal results are displayed) Labs Reviewed  CBC -  Abnormal; Notable for the following components:      Result Value   HCT 53.5 (*)    All other components within normal limits  COMPREHENSIVE METABOLIC PANEL - Abnormal; Notable for the following components:   Glucose, Bld 184 (*)    Creatinine, Ser 1.47 (*)    Total Protein 6.3 (*)    Albumin 3.4 (*)    GFR, Estimated 47 (*)    All other components within normal limits  CBG MONITORING, ED - Abnormal; Notable for the following components:   Glucose-Capillary 175 (*)    All other components within normal limits  I-STAT CHEM 8, ED - Abnormal; Notable for the following components:   Creatinine, Ser 1.30 (*)    Glucose, Bld 174 (*)    Hemoglobin 18.0 (*)    HCT 53.0 (*)    All other components within normal limits  SARS CORONAVIRUS 2 BY RT PCR (HOSPITAL ORDER, [facility])  PROTIME-INR  APTT  DIFFERENTIAL  CBG MONITORING, ED    EKG EKG Interpretation  Date/Time:  Wednesday August 05 2020 13:55:55 EST Ventricular Rate:  90 PR Interval:    QRS Duration: 97 QT Interval:  354 QTC Calculation: 434 R Axis:   79 Text Interpretation: Atrial fibrillation Borderline low voltage, extremity leads Anteroseptal infarct, old No significant change since last tracing Confirmed  by Blanchie Dessert [phone]) on 08/05/2020 2:22:50 PM   Radiology CT HEAD CODE STROKE WO CONTRAST  Result Date: 08/05/2020 CLINICAL DATA:  Code stroke. Acute neuro deficit with left-sided weakness EXAM: CT HEAD WITHOUT CONTRAST TECHNIQUE: Contiguous axial images were obtained from the base of the skull through the vertex without intravenous contrast. COMPARISON:  CT head 06/09/2020 FINDINGS: Brain: Moderate atrophy. Negative for hydrocephalus. Moderate white matter hypodensity bilaterally unchanged. Negative for acute infarct or hemorrhage. 1 cm dural calcification left posterior falx unchanged. Vascular: Negative for hyperdense vessel Skull: Negative Sinuses/Orbits: Paranasal sinuses clear.   Negative orbit Other: None ASPECTS ([place] Stroke Program Early CT Score) - Ganglionic level infarction (caudate, lentiform nuclei, internal capsule, insula, M1-M3 cortex): 7 - Supraganglionic infarction (M4-M6 cortex): 3 Total score (0-10 with 10 being normal): 10 IMPRESSION: 1. No acute abnormality no change from the prior CT 06/09/2020 2. ASPECTS is 10 3. Moderate atrophy and chronic microvascular ischemic change in the white matter. 4. Code stroke imaging results were communicated on 08/05/2020 at 1:47 pm to provider Bhagat via text page Electronically Signed   By: Franchot Gallo M.D.   On: 08/05/2020 13:48    Procedures .Critical Care Performed by: Deliah Boston, PA-C Authorized by: Deliah Boston, PA-C   Critical care provider statement:    Critical care time (minutes):  35   Critical care was time spent personally by me on the following activities:  Discussions with consultants, evaluation of patient's response to treatment, examination of patient, ordering and performing treatments and interventions, ordering and review of laboratory studies, ordering and review of radiographic studies, pulse oximetry, re-evaluation of patient's condition, obtaining history from patient or surrogate, review of old charts and development of treatment plan with patient or surrogate     Medications Ordered in ED Medications  sodium chloride flush (NS) 0.9 % injection 3 mL (has no administration in time range)    ED Course  I have reviewed the triage vital signs and the nursing notes.  Pertinent labs & imaging results that were available during my care of the patient were reviewed by me and considered in my medical decision making (see chart for details).  Clinical Course as of 08/05/20 1454  Wed Aug 05, 2020  1440 Dr. Manus Rudd [BM]    Clinical Course User Index [BM] Gari Crown   MDM Rules/Calculators/A&P                         Additional history obtained from: 1. Nursing  notes from this visit. 2. EMR review. 3. Neurology team. ------------------------- I ordered, reviewed and interpreted labs which include: CBC without leukocytosis or anemia. PT/INR APTT within normal limits. CMP shows slightly improved creatinine from prior, no emergent electrolyte derangement, AKI, LFT elevations or gap. CBG 175 Covid test pending.  CT Head:  IMPRESSION:  1. No acute abnormality no change from the prior CT 06/09/2020  2. ASPECTS is 10  3. Moderate atrophy and chronic microvascular ischemic change in the  white matter.  4. Code stroke imaging results were communicated on 08/05/2020 at  1:47 pm to provider Bhagat via text page   EKG: Atrial fibrillation Borderline low voltage, extremity leads Anteroseptal infarct, old No significant change since last tracing Confirmed by Blanchie Dessert [phone]) on 08/05/2020 2:22:50 PM -------------------- 2:40 PM: Consult with medicine team spoke with Dr. Manus Rudd who accepts patient for admission.  Patient reevaluated resting comfortably no acute distress vital signs stable on room  air.  He is agreeable for admission no new complaints or concerns.  Patient seen and evaluated by Dr. Maryan Rued during this visit who agrees with plan of care.  Reginald [name] was evaluated in Emergency Department on 08/05/2020 for the symptoms described in the history of present illness. He was evaluated in the context of the global COVID-19 pandemic, which necessitated consideration that the patient might be at risk for infection with the SARS-CoV-2 virus that causes COVID-19. Institutional protocols and algorithms that pertain to the evaluation of patients at risk for COVID-19 are in a state of rapid change based on information released by regulatory bodies including the CDC and federal and state organizations. These policies and algorithms were followed during the patient's care in the ED.  Note: Portions of this report may have been transcribed using  voice recognition software. Every effort was made to ensure accuracy; however, inadvertent computerized transcription errors may still be present. Final Clinical Impression(s) / ED Diagnoses Final diagnoses:  Cerebrovascular accident (CVA), unspecified mechanism [facility])    Rx / DC Orders ED Discharge Orders    None       Gari Crown 08/05/20 1455    Blanchie Dessert, MD 08/08/20 [phone]

## 2020-08-05 NOTE — ED Triage Notes (Signed)
Pt here via [facility] from [facility] where he was helped to the floor d/t L sided weakness while attempting to get out of bed.  Hx of R sided stroke several  Years ago - pt is on eloquis.  Pt c/o sore throat and states his roommate "failed" the covid test yesterday.  AO x 4.

## 2020-08-05 NOTE — Code Documentation (Signed)
Stroke Response Nurse Documentation Code Documentation  Reginald [name] is a 85 y.o. male arriving to [facility] ED via [facility] on 08/05/2020 with past medical hx of Afib on Eliquis, Ischemic Stroke, TCAR. Code stroke was activated by EMS. Patient from [facility] where he was LKW at 1100 and now complaining of left sided weakness. Patient was working with PT when they noted a sudden onset of left leg weakness that cause patient to be lowered to the bed.   Stroke team at the bedside on patient arrival. Labs drawn and patient cleared for CT by EDP. Patient to CT with team. NIHSS 5, see documentation for details and code stroke times. Patient with left facial droop, left arm weakness and left leg weakness on exam. The following imaging was completed:  CT.  Patient is not a candidate for tPA due to being on Eliquis, Last Dose this morning. Care/Plan: q2 mNIHSS/VS and MRI. Bedside handoff with ED RN Hassan Rowan.    Kathrin Greathouse  Stroke Response RN

## 2020-08-05 NOTE — Consult Note (Signed)
Neurology Code Stroke Consult  CC: CODE STROKE  History is obtained from: EMS, patient  HPI: Reginald [name] is a 85 y.o. male who resides in SNF/rehab s/p stroke and is brought to [facility] ED today by EMS as a code stroke. PMHx includes AF, CAD, HTN, CKD,. COPD, Chronic venous insufficiency, obesity, history of old right occipital infarct, and uncontrolled IDDM II. He is on Eliquis 2.5mg  po bid and 81mg  ASA for AF.   LKW is not well known. PT went to get patient out of bed at 1100 hrs and he slid to floor with noted left leg weakness. However, he had been in bed prior to this during the a.m. and it is unknown if he was weak in the bed. He is on Eliquis and took this at 0805 hrs today. He is not a candidate for tPA. Other sx noted by EMS was a LUE drift with some weakness and unable to lift LLE off bed. Left facial droop noted on scene but improved in route. CBG 301, BP 152/90. HR 80 AF.   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 Last neurology f/up was 07/20/20 stated patient presented top hospital on 06/09/20 with sudden OD painless loss of vision. He was found to have R CRAO in setting of symptomatic RICA stenosis. Patient underwent TCAR and placed on DAPT. Plavix was d/c'd by vascular surgery on 1/3 followup, but ASA and Eliquis have been continued. Also, noted to be on Lipitor.   Mostly wheelchair bound x 2 years per chart notes, but also in rehabilitation to improve ambulation at this time  Patient was taken urgently to CT scan. No dysarthria noted. No aphasia. Left UE drift with weakness compared to the right. Unable to lift LLE off bed.   LKW: 1100 hrs, but questionable tpa given?: No, had Eliquis this am.  IR Thrombectomy? No, no LVO suspected.   NIHSS:  1a Level of Conscious: 0  1b LOC Questions: 0 1c LOC Commands: 0 2 Best Gaze: 0  3 Visual: 0 -- chronic loss of vision in the right eye, no new visual loss in left eye 4 Facial Palsy:  1 slight left facial droop 5a Motor Arm - left: 1 5b Motor Arm - Right: 0 6a Motor Leg - Left: 3 6b Motor Leg - Right: 0 7 Limb Ataxia: 0 8 Sensory: 0 9 Best Language: 0 10 Dysarthria: 0 11 Extinct. and Inatten: 0 TOTAL: 5  ROS: + dysphagia but described as just a sore throat. Other ROS negative except as noted in HPI.   Past Medical History:  Diagnosis Date  . Anteroseptal myocardial infarction ([place])   . Carotid artery occlusion   . CHF (congestive heart failure) ([place])   . Chronic kidney disease   . COPD (chronic obstructive pulmonary disease) ([place])   . Coronary atherosclerosis of unspecified type of vessel, native or graft    a. s/p prior PCI to LAD in 1997  . Diverticulosis   . ED (erectile dysfunction)   . Foley catheter in place   . Glaucoma   . Hematuria   . History of 2019 novel coronavirus disease (COVID-19) 11/2018  . Hydronephrosis, bilateral   . Hydroureter   . Hypercholesteremia   . Hypertensive renal disease   . Hypogonadism male   . Hypokalemia   . Obesity hypoventilation syndrome ([place])   . Obesity, unspecified   . OSA (obstructive sleep apnea)   .  Peripheral vascular disease ([place])   . Permanent atrial fibrillation ([place])   . Phimosis   . Pneumonia   . Rhabdomyolysis   . Tremor   . Type II or unspecified type diabetes mellitus without mention of complication, not stated as uncontrolled   . Unspecified essential hypertension   . Urinary incontinence   . Urinary retention   . Vestibulopathy    Past Surgical History:  Procedure Laterality Date  . APPENDECTOMY  1985  . CARDIAC CATHETERIZATION    . CHOLECYSTECTOMY  06/2000  . CIRCUMCISION N/A 05/28/2020   Procedure: CIRCUMCISION ADULT;  Surgeon: Franchot Gallo, MD;  Location: [facility] ORS;  Service: Urology;  Laterality: N/A;  . CORONARY ANGIOPLASTY  08/14/1995   stent placement to LAD   . DORSAL SLIT N/A 05/28/2020   Procedure: DORSAL SLIT;  Surgeon: Franchot Gallo, MD;  Location: [facility] ORS;   Service: Urology;  Laterality: N/A;  42 MINS  . ENDOVENOUS ABLATION SAPHENOUS VEIN W/ LASER Left 02/21/2018   endovenous laser ablation L GSV by Ruta Hinds MD   . [place] Right 1985  . KNEE ARTHROSCOPY Left 1991  . LUMBAR FUSION    . NOSE SURGERY  1970s   Per Dr. Terrence Dupont [facility] in pt chart  . TRANSCAROTID ARTERY REVASCULARIZATION Right 06/12/2020   Procedure: RIGHT TRANSCAROTID ARTERY REVASCULARIZATION;  Surgeon: Serafina Mitchell, MD;  Location: [facility];  Service: Vascular;  Laterality: Right;    Current Outpatient Medications  Medication Instructions  . acetaminophen (TYLENOL) 650 mg, Oral, Every 4 hours PRN  . albuterol (VENTOLIN HFA) 108 (90 Base) MCG/ACT inhaler 2 puffs, Inhalation, 2 times daily PRN  . apixaban (ELIQUIS) 2.5 mg, Oral, 2 times daily  . aspirin 81 mg, Oral, Daily, Swallow whole.  Marland Kitchen atorvastatin (LIPITOR) 40 mg, Oral, Daily at bedtime  . cetirizine (ZYRTEC) 5 mg, Oral, Daily at bedtime  . cholecalciferol (VITAMIN D) 2,000 Units, Oral, Daily  . docusate sodium (COLACE) 100 mg, Oral, Daily at bedtime  . gabapentin (NEURONTIN) 200 mg, Oral, Daily at bedtime  . hydroxypropyl methylcellulose / hypromellose (ISOPTO TEARS / GONIOVISC) 2.5 % ophthalmic solution 1 drop, Both Eyes, 4 times daily  . insulin lispro (HUMALOG) 0-14 Units, Subcutaneous, See admin instructions, Inject 0-14 units subcutaneously twice daily per sliding scale: CBG 0-200 0 units, 201-250 2 units, 251-300 4 units, 301-350 6 units, 351-400 8 units, 401-450 10 units, 451-500 12 units, >500 14 units If blood sugar is > 400 or < 80 call pec triage  . latanoprost (XALATAN) 0.005 % ophthalmic solution 1 drop, Both Eyes, Daily at bedtime  . magnesium oxide (MAG-OX) 400 mg, Oral, Daily  . melatonin 5 mg, Oral, Daily at bedtime  . Menthol, Topical Analgesic, (BIOFREEZE) 4 % GEL 1 application, Topical, 2 times daily PRN  . metoprolol succinate (TOPROL XL) 12.5 mg, Oral, Daily  . nitroGLYCERIN  (NITROSTAT) 0.4 mg, Sublingual, Every 5 min PRN  . oxybutynin (DITROPAN) 5 mg, Oral, Every 8 hours PRN  . polyethylene glycol (MIRALAX / GLYCOLAX) 17 g, Oral, Daily, Mix in 6 oz fluid and drink   . Skin Protectants, Misc. (EUCERIN) cream 1 application, Topical, 2 times daily, For itching  . tamsulosin (FLOMAX) 0.4 mg, Oral, Daily after supper  . Trulicity 3 mg, Subcutaneous, Weekly, Mondays   . vitamin B-12 (CYANOCOBALAMIN) 1,000 mcg, Oral, Daily     Family History  Problem Relation Age of Onset  . COPD Mother   . Heart disease Mother   . Diabetes Father   .  Heart disease Father   . Diabetes Sister   . Heart disease Brother   . Heart disease Brother   . Breast cancer Maternal Aunt   . Diabetes Paternal Grandmother   . Colon cancer Maternal Aunt   . Ovarian cancer Daughter   . Glaucoma Other     Social History:  reports that he has never smoked. He has never used smokeless tobacco. He reports that he does not drink alcohol and does not use drugs.   Prior to Admission medications   Medication Sig Start Date End Date Taking? Authorizing Provider  acetaminophen (TYLENOL) 325 MG tablet Take 650 mg by mouth every 4 (four) hours as needed for moderate pain.     [provider]  albuterol (VENTOLIN HFA) 108 (90 Base) MCG/ACT inhaler Inhale 2 puffs into the lungs 2 (two) times daily as needed for wheezing or shortness of breath.    [provider]  apixaban (ELIQUIS) 2.5 MG TABS tablet Take 2.5 mg by mouth 2 (two) times daily.    [provider]  aspirin EC 81 MG EC tablet Take 1 tablet (81 mg total) by mouth daily. Swallow whole. 06/13/20   Hosie Poisson, MD  atorvastatin (LIPITOR) 40 MG tablet Take 40 mg by mouth at bedtime.    [provider]  cetirizine (ZYRTEC) 10 MG tablet Take 5 mg by mouth at bedtime.     [provider]  cholecalciferol (VITAMIN D) 25 MCG (1000 UNIT) tablet Take 2,000 Units by mouth daily.    [provider]   docusate sodium (COLACE) 100 MG capsule Take 100 mg by mouth at bedtime.    [provider]  Dulaglutide (TRULICITY) 3 AT/5.5DD SOPN Inject 3 mg into the skin once a week. Mondays    [provider]  gabapentin (NEURONTIN) 100 MG capsule Take 200 mg by mouth at bedtime.     [provider]  hydroxypropyl methylcellulose / hypromellose (ISOPTO TEARS / GONIOVISC) 2.5 % ophthalmic solution Place 1 drop into both eyes 4 (four) times daily.    [provider]  insulin lispro (HUMALOG) 100 UNIT/ML injection Inject 0-14 Units into the skin See admin instructions. Inject 0-14 units subcutaneously twice daily per sliding scale: CBG 0-200 0 units, 201-250 2 units, 251-300 4 units, 301-350 6 units, 351-400 8 units, 401-450 10 units, 451-500 12 units, >500 14 units If blood sugar is > 400 or < 80 call pec triage    [provider]  latanoprost (XALATAN) 0.005 % ophthalmic solution Place 1 drop into both eyes at bedtime. 01/03/17   [provider]  magnesium oxide (MAG-OX) 400 MG tablet Take 400 mg by mouth daily.    [provider]  Melatonin 5 MG TABS Take 5 mg by mouth at bedtime.    [provider]  Menthol, Topical Analgesic, (BIOFREEZE) 4 % GEL Apply 1 application topically 2 (two) times daily as needed (forehead and left arm pain).    [provider]  metoprolol succinate (TOPROL XL) 25 MG 24 hr tablet Take 0.5 tablets (12.5 mg total) by mouth daily. 06/14/20 06/14/21  Hosie Poisson, MD  nitroGLYCERIN (NITROSTAT) 0.4 MG SL tablet Place 0.4 mg under the tongue every 5 (five) minutes as needed for chest pain.    [provider]  oxybutynin (DITROPAN) 5 MG tablet Take 5 mg by mouth every 8 (eight) hours as needed for bladder spasms.  05/07/20   [provider]  polyethylene glycol (MIRALAX / GLYCOLAX) 17  g packet Take 17 g by mouth daily. Mix in 6 oz fluid and drink    [provider]  Skin Protectants,  Misc. (EUCERIN) cream Apply 1 application topically 2 (two) times a day. For itching    [provider]  tamsulosin (FLOMAX) 0.4 MG CAPS capsule Take 1 capsule (0.4 mg total) by mouth daily after supper. 12/25/18   Kayleen Memos, DO  vitamin B-12 (CYANOCOBALAMIN) 1000 MCG tablet Take 1,000 mcg by mouth daily.    [provider]    Exam: Current vital signs: BP (!) 153/90 (BP Location: Right Arm)   Pulse (!) 52   Temp 97.9 F (36.6 C) (Oral)   Resp (!) 23   Ht 5\' 11"  (1.803 m)   Wt (!) 138.8 kg   SpO2 96%   BMI 42.68 kg/m   Physical Exam  Constitutional: Appears well-developed , morbidly obese. Appears well, chronically ill Psych: Affect appropriate to situation Eyes: No scleral injection HENT: No OP obstrucion. Very HOH Head: Normocephalic.  Cardiovascular: irreg irreg, rate controlled.  Respiratory: Effort normal. SaO2 96% RA GI: Obese. Soft.  Skin: WDI. LEs from knee down are wrapped in coban.   Neuro: Mental Status: Patient is awake, alert, oriented to person, place, month, year, and situation. Patient is able to give a clear and coherent history. No signs of aphasia or neglect. Speech/Language: Speech is intact and fluent. No dysarthria. Comprehension, repetition, and naming intact.  Cranial Nerves: II: Visual Fields are notable for near blindness in the right eye, full in the left eye. III,IV, VI: EOMI without ptosis or diploplia.  V: Facial sensation is symmetric to light touch. Able to move jaw back and forth.  VII: Smile is symmetrical. Able to puff cheeks and raise eyebrows.  VIII: hearing is intact to voice IX, X: Uvula elevates symmetrically. Phonation normal.  XI: Shoulder shrug is symmetric. XII: tongue is midline without atrophy or fasciculations.  Motor: Tone is normal. Bulk is normal. 4/5 LUE, 1/5 strength LLE, 5/5 otherwise.  Sensory: Sensation is symmetric to light touch and temperature in the arms and legs. Extinction intact.  Deep  Tendon Reflexes:  1+ brachioradialis, 0 patella.  Plantars: Toes are downgoing bilaterally. Cerebellar: FNF intact bilaterally. LUE drift.  Gait: deferred.   I have reviewed labs in epic and the pertinent results are: INR   1.2    Creat 1.30    LDL  51 on 06/11/20     TSH 2.255 2012    HbA1c 8.1 07/2019  I have reviewed the images obtained:  NCT head  1. No acute abnormality no change from the prior CT 06/09/2020 2. ASPECTS is 10 3. Moderate atrophy and chronic microvascular ischemic change in the white matter.    Assessment: Reginald [name] is a 85 y.o. male PMHx filled with many stroke risk factors including old stroke, obesity, uncontrolled IDDM II, CAD, and AF. He is  presenting with left sided weakness today found at SNF/rehab. On Eliquis 2.5mg  po bid. Not a candidate for tPA. Low suspicion for LVO.   Impression:  1.  Concern for acute right lacunar stroke leading to left-sided weakness  Plan: - Admission for stroke workup - MRI brain, MRA head and neck (ordered) - TTE. - HbA1c, lipid panel, TSH. - Recommend increasing statin if LDL > 70 - Aspirin 81mg  daily. - Continue Eliquis 2.5mg  po bid.  - SBP goal - Permissive hypertension first 24 h < 220/110. Hold home  medications for now. - Telemetry  monitoring for arrhythmia. - Recommend bedside Swallow screen. - Recommend Stroke education. - Recommend PT/OT/SLP consult. - Recommend metabolic/infectious workup with CBC, CMP, UA with UCx, CXR, CK, serum lactate. - Stroke team to follow  Pt seen by Clance Boll, NP and MD. Note/plan to be edited by MD as necessary.  Pager: [phone]  Attending Neurologist's note:  I personally saw this patient, gathering history, performing a full neurologic examination, reviewing relevant labs, personally reviewing relevant imaging including Head CT, and formulated the assessment and plan, adding the note above for completeness and clarity to accurately reflect my thoughts

## 2020-08-06 ENCOUNTER — Observation Stay (HOSPITAL_COMMUNITY): Payer: Medicare Other

## 2020-08-06 DIAGNOSIS — R29705 NIHSS score 5: Secondary | ICD-10-CM | POA: Diagnosis present

## 2020-08-06 DIAGNOSIS — I6381 Other cerebral infarction due to occlusion or stenosis of small artery: Secondary | ICD-10-CM | POA: Diagnosis present

## 2020-08-06 DIAGNOSIS — E1151 Type 2 diabetes mellitus with diabetic peripheral angiopathy without gangrene: Secondary | ICD-10-CM | POA: Diagnosis present

## 2020-08-06 DIAGNOSIS — U071 COVID-19: Secondary | ICD-10-CM

## 2020-08-06 DIAGNOSIS — I2510 Atherosclerotic heart disease of native coronary artery without angina pectoris: Secondary | ICD-10-CM | POA: Diagnosis present

## 2020-08-06 DIAGNOSIS — N400 Enlarged prostate without lower urinary tract symptoms: Secondary | ICD-10-CM | POA: Diagnosis present

## 2020-08-06 DIAGNOSIS — R29810 Facial weakness: Secondary | ICD-10-CM | POA: Diagnosis present

## 2020-08-06 DIAGNOSIS — I4819 Other persistent atrial fibrillation: Secondary | ICD-10-CM

## 2020-08-06 DIAGNOSIS — E1122 Type 2 diabetes mellitus with diabetic chronic kidney disease: Secondary | ICD-10-CM | POA: Diagnosis present

## 2020-08-06 DIAGNOSIS — J449 Chronic obstructive pulmonary disease, unspecified: Secondary | ICD-10-CM | POA: Diagnosis present

## 2020-08-06 DIAGNOSIS — E1159 Type 2 diabetes mellitus with other circulatory complications: Secondary | ICD-10-CM

## 2020-08-06 DIAGNOSIS — I10 Essential (primary) hypertension: Secondary | ICD-10-CM

## 2020-08-06 DIAGNOSIS — I69398 Other sequelae of cerebral infarction: Secondary | ICD-10-CM | POA: Diagnosis not present

## 2020-08-06 DIAGNOSIS — E876 Hypokalemia: Secondary | ICD-10-CM | POA: Diagnosis present

## 2020-08-06 DIAGNOSIS — Z6841 Body Mass Index (BMI) 40.0 and over, adult: Secondary | ICD-10-CM | POA: Diagnosis not present

## 2020-08-06 DIAGNOSIS — I5032 Chronic diastolic (congestive) heart failure: Secondary | ICD-10-CM | POA: Diagnosis present

## 2020-08-06 DIAGNOSIS — I639 Cerebral infarction, unspecified: Secondary | ICD-10-CM | POA: Diagnosis present

## 2020-08-06 DIAGNOSIS — I6389 Other cerebral infarction: Secondary | ICD-10-CM

## 2020-08-06 DIAGNOSIS — G8194 Hemiplegia, unspecified affecting left nondominant side: Secondary | ICD-10-CM | POA: Diagnosis present

## 2020-08-06 DIAGNOSIS — N3289 Other specified disorders of bladder: Secondary | ICD-10-CM | POA: Diagnosis present

## 2020-08-06 DIAGNOSIS — E7800 Pure hypercholesterolemia, unspecified: Secondary | ICD-10-CM | POA: Diagnosis present

## 2020-08-06 DIAGNOSIS — R471 Dysarthria and anarthria: Secondary | ICD-10-CM | POA: Diagnosis present

## 2020-08-06 DIAGNOSIS — N179 Acute kidney failure, unspecified: Secondary | ICD-10-CM | POA: Diagnosis present

## 2020-08-06 DIAGNOSIS — H548 Legal blindness, as defined in USA: Secondary | ICD-10-CM | POA: Diagnosis present

## 2020-08-06 DIAGNOSIS — I63311 Cerebral infarction due to thrombosis of right middle cerebral artery: Secondary | ICD-10-CM | POA: Diagnosis not present

## 2020-08-06 DIAGNOSIS — G4733 Obstructive sleep apnea (adult) (pediatric): Secondary | ICD-10-CM | POA: Diagnosis present

## 2020-08-06 DIAGNOSIS — I130 Hypertensive heart and chronic kidney disease with heart failure and stage 1 through stage 4 chronic kidney disease, or unspecified chronic kidney disease: Secondary | ICD-10-CM | POA: Diagnosis present

## 2020-08-06 DIAGNOSIS — N1831 Chronic kidney disease, stage 3a: Secondary | ICD-10-CM | POA: Diagnosis present

## 2020-08-06 DIAGNOSIS — Z8673 Personal history of transient ischemic attack (TIA), and cerebral infarction without residual deficits: Secondary | ICD-10-CM

## 2020-08-06 LAB — CBC WITH DIFFERENTIAL/PLATELET
Abs Immature Granulocytes: 0.01 10*3/uL (ref 0.00–0.07)
Basophils Absolute: 0 10*3/uL (ref 0.0–0.1)
Basophils Relative: 0 %
Eosinophils Absolute: 0.5 10*3/uL (ref 0.0–0.5)
Eosinophils Relative: 7 %
HCT: 49.4 % (ref 39.0–52.0)
Hemoglobin: 15.8 g/dL (ref 13.0–17.0)
Immature Granulocytes: 0 %
Lymphocytes Relative: 22 %
Lymphs Abs: 1.6 10*3/uL (ref 0.7–4.0)
MCH: 29.5 pg (ref 26.0–34.0)
MCHC: 32 g/dL (ref 30.0–36.0)
MCV: 92.2 fL (ref 80.0–100.0)
Monocytes Absolute: 0.8 10*3/uL (ref 0.1–1.0)
Monocytes Relative: 11 %
Neutro Abs: 4.3 10*3/uL (ref 1.7–7.7)
Neutrophils Relative %: 60 %
Platelets: 183 10*3/uL (ref 150–400)
RBC: 5.36 MIL/uL (ref 4.22–5.81)
RDW: 14.1 % (ref 11.5–15.5)
WBC: 7.1 10*3/uL (ref 4.0–10.5)
nRBC: 0 % (ref 0.0–0.2)

## 2020-08-06 LAB — GLUCOSE, CAPILLARY
Glucose-Capillary: 134 mg/dL — ABNORMAL HIGH (ref 70–99)
Glucose-Capillary: 144 mg/dL — ABNORMAL HIGH (ref 70–99)
Glucose-Capillary: 160 mg/dL — ABNORMAL HIGH (ref 70–99)
Glucose-Capillary: 173 mg/dL — ABNORMAL HIGH (ref 70–99)
Glucose-Capillary: 178 mg/dL — ABNORMAL HIGH (ref 70–99)

## 2020-08-06 LAB — COMPREHENSIVE METABOLIC PANEL
ALT: 22 U/L (ref 0–44)
AST: 18 U/L (ref 15–41)
Albumin: 3.1 g/dL — ABNORMAL LOW (ref 3.5–5.0)
Alkaline Phosphatase: 55 U/L (ref 38–126)
Anion gap: 11 (ref 5–15)
BUN: 12 mg/dL (ref 8–23)
CO2: 27 mmol/L (ref 22–32)
Calcium: 9.3 mg/dL (ref 8.9–10.3)
Chloride: 103 mmol/L (ref 98–111)
Creatinine, Ser: 1.37 mg/dL — ABNORMAL HIGH (ref 0.61–1.24)
GFR, Estimated: 52 mL/min — ABNORMAL LOW (ref >60)
Glucose, Bld: 149 mg/dL — ABNORMAL HIGH (ref 70–99)
Potassium: 3.4 mmol/L — ABNORMAL LOW (ref 3.5–5.1)
Sodium: 141 mmol/L (ref 135–145)
Total Bilirubin: 1.1 mg/dL (ref 0.3–1.2)
Total Protein: 5.5 g/dL — ABNORMAL LOW (ref 6.5–8.1)

## 2020-08-06 LAB — LIPID PANEL
Cholesterol: 118 mg/dL (ref 0–200)
HDL: 31 mg/dL — ABNORMAL LOW (ref >40)
LDL Cholesterol: 45 mg/dL (ref 0–99)
Total CHOL/HDL Ratio: 3.8 RATIO
Triglycerides: 209 mg/dL — ABNORMAL HIGH (ref <150)
VLDL: 42 mg/dL — ABNORMAL HIGH (ref 0–40)

## 2020-08-06 LAB — HEMOGLOBIN A1C
Hgb A1c MFr Bld: 7.4 % — ABNORMAL HIGH (ref 4.8–5.6)
Mean Plasma Glucose: 165.68 mg/dL

## 2020-08-06 LAB — ECHOCARDIOGRAM COMPLETE
Area-P 1/2: 3.65 cm2
Height: 71 in
S' Lateral: 3.5 cm
Weight: 4895.98 oz

## 2020-08-06 LAB — TSH: TSH: 3.988 u[IU]/mL (ref 0.350–4.500)

## 2020-08-06 MED ORDER — POTASSIUM CHLORIDE CRYS ER 20 MEQ PO TBCR
20.0000 meq | EXTENDED_RELEASE_TABLET | Freq: Once | ORAL | Status: AC
  Administered 2020-08-06: 20 meq via ORAL
  Filled 2020-08-06: qty 1

## 2020-08-06 MED ORDER — PERFLUTREN LIPID MICROSPHERE
1.0000 mL | INTRAVENOUS | Status: AC | PRN
  Administered 2020-08-06: 2 mL via INTRAVENOUS
  Filled 2020-08-06: qty 10

## 2020-08-06 MED ORDER — APIXABAN 5 MG PO TABS
5.0000 mg | ORAL_TABLET | Freq: Two times a day (BID) | ORAL | Status: DC
  Administered 2020-08-06 – 2020-08-07 (×2): 5 mg via ORAL
  Filled 2020-08-06 (×2): qty 1

## 2020-08-06 NOTE — Evaluation (Signed)
Physical Therapy Evaluation Patient Details Name: Reginald [name] MRN: [id number] DOB: 1935-09-22 Today's Date: 08/06/2020   History of Present Illness  85 y.o. male presenting with left sided weakness and concern for stroke. MRI (+) 66mm acute ischemic nonhemorhagic infarct in posterior R internal capsule/corona radiata. PMH is significant for previous CVA 12/21 w/ residual R visual deficits, Hx of Covid-19, CAD, CHF, morbid obesity, CKD, COPD, CVA, OSA, hypertension, hypercholesterolemia, diabetes.  Clinical Impression  Pt presents to PT with deficits in functional mobility, gait, balance, strength, power, endurance. Pt with significant L sided weakness, LE weaker than UE. Pt requires significant physical assistance to mobilize in the bed and to transfer, unable to fully extend hips for erect standing posture but does clear buttocks from bed. Pt also demonstrates some difficulty maintaining sitting balance without UE support at this time. Pt is at a high risk for falls due to L lateral weakness and will benefit from continued acute PT POC to improve L sided strength and to reduce falls risk. PT recommends return to SNF with continued PT services.    Follow Up Recommendations SNF    Equipment Recommendations   (defer to SNF)    Recommendations for Other Services       Precautions / Restrictions Precautions Precautions: Fall Precaution Comments: HOH, blindess in R eye from previous CVA, L-hemi Restrictions Weight Bearing Restrictions: No      Mobility  Bed Mobility Overal bed mobility: Needs Assistance Bed Mobility: Rolling;Supine to Sit;Sit to Supine Rolling: Max assist   Supine to sit: Max assist Sit to supine: Max assist   General bed mobility comments: Max A for rolling to L to place bedpan.    Transfers Overall transfer level: Needs assistance Equipment used: 1 person hand held assist;2 person hand held assist Transfers: Sit to/from Stand Sit to Stand: Max assist;+2  physical assistance;From elevated surface         General transfer comment: pt requires knee block and BUE support  Ambulation/Gait                Stairs            Wheelchair Mobility    Modified Rankin (Stroke Patients Only)       Balance Overall balance assessment: Needs assistance Sitting-balance support: Feet supported;Single extremity supported;Bilateral upper extremity supported Sitting balance-Leahy Scale: Poor Sitting balance - Comments: reliant on UE support or minA Postural control: Left lateral lean Standing balance support: Bilateral upper extremity supported Standing balance-Leahy Scale: Poor Standing balance comment: maxA to stand                             Pertinent Vitals/Pain Pain Assessment: No/denies pain    Home Living Family/patient expects to be discharged to:: Skilled nursing facility                 Additional Comments: [facility]    Prior Function Level of Independence: Needs assistance   Gait / Transfers Assistance Needed: pt reports he has been ambulating with therapy for 50' at a time with use of walker, otherwise performs squat pivot transfers with assistance to wheelchair  ADL's / Homemaking Assistance Needed: Assist wtih ADLs at baseline with the exception of self-feeding.        Hand Dominance   Dominant Hand: Right    Extremity/Trunk Assessment   Upper Extremity Assessment Upper Extremity Assessment: Defer to OT evaluation    Lower Extremity Assessment  Lower Extremity Assessment: LLE deficits/detail;RLE deficits/detail RLE Deficits / Details: grossly 4-/5 LLE Deficits / Details: 0/5 throughout    Cervical / Trunk Assessment Cervical / Trunk Assessment: Normal  Communication   Communication: No difficulties  Cognition Arousal/Alertness: Awake/alert Behavior During Therapy: WFL for tasks assessed/performed Overall Cognitive Status: Within Functional Limits for tasks assessed                                         General Comments General comments (skin integrity, edema, etc.): VSS on RA    Exercises General Exercises - Lower Extremity Ankle Circles/Pumps: AROM;Both;5 reps Gluteal Sets: AROM;Both;5 reps Long Arc Quad: AROM;Both;5 reps   Assessment/Plan    PT Assessment Patient needs continued PT services  PT Problem List Decreased strength;Decreased activity tolerance;Decreased balance;Decreased mobility;Decreased knowledge of use of DME;Decreased safety awareness;Decreased knowledge of precautions       PT Treatment Interventions DME instruction;Gait training;Functional mobility training;Therapeutic activities;Therapeutic exercise;Balance training;Cognitive remediation;Neuromuscular re-education;Patient/family education;Wheelchair mobility training    PT Goals (Current goals can be found in the Care Plan section)  Acute Rehab PT Goals Patient Stated Goal: to return to SNF and improve L sided strength PT Goal Formulation: With patient Time For Goal Achievement: 08/20/20 Potential to Achieve Goals: Fair    Frequency Min 3X/week   Barriers to discharge        Co-evaluation               AM-PAC PT "6 Clicks" Mobility  Outcome Measure Help needed turning from your back to your side while in a flat bed without using bedrails?: A Lot Help needed moving from lying on your back to sitting on the side of a flat bed without using bedrails?: A Lot Help needed moving to and from a bed to a chair (including a wheelchair)?: A Lot Help needed standing up from a chair using your arms (e.g., wheelchair or bedside chair)?: A Lot Help needed to walk in hospital room?: Total Help needed climbing 3-5 steps with a railing? : Total 6 Click Score: 10    End of Session Equipment Utilized During Treatment: Gait belt Activity Tolerance: Patient tolerated treatment well Patient left: in bed;with call bell/phone within reach;with bed alarm set Nurse  Communication: Mobility status;Need for lift equipment PT Visit Diagnosis: Other abnormalities of gait and mobility (R26.89);Muscle weakness (generalized) (M62.81);Hemiplegia and hemiparesis Hemiplegia - Right/Left: Left Hemiplegia - caused by: Cerebral infarction    Time: 5643-3295 PT Time Calculation (min) (ACUTE ONLY): 32 min   Charges:   PT Evaluation $PT Eval Moderate Complexity: 1 Mod PT Treatments $Therapeutic Activity: 8-22 mins        Zenaida Niece, PT, DPT Acute Rehabilitation Pager: [phone]   Zenaida Niece 08/06/2020, 10:40 AM

## 2020-08-06 NOTE — Progress Notes (Signed)
STROKE TEAM PROGRESS NOTE   SUBJECTIVE (INTERVAL HISTORY) His RN is at the bedside.  Overall his condition is stable.  Patient lying in bed, no complaints.  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  Still has left arm and leg weakness due to stroke this time.  He has been on aspirin and Eliquis 2.5 twice daily at home.  However, this time he is creatinine improved, less than 1.5, will consult pharmacy for appropriate Eliquis dosing.  MRI showed right PLIC small infarct.  PT/OT recommend SNF.  OBJECTIVE Temp:  [97.7 F (36.5 C)-98.7 F (37.1 C)] 98.5 F (36.9 C) (01/27 1145) Pulse Rate:  [52-85] 67 (01/27 1145) Cardiac Rhythm: Atrial fibrillation (01/27 0754) Resp:  [12-25] 20 (01/27 1145) BP: (131-172)/(63-117) 151/83 (01/27 1145) SpO2:  [91 %-97 %] 94 % (01/27 1145) Weight:  [138.8 kg] 138.8 kg (01/26 1400)  Recent Labs  Lab 08/05/20 1332 08/06/20 0011 08/06/20 0630 08/06/20 1144  GLUCAP 175* 144* 134* 178*   Recent Labs  Lab 08/05/20 1338 08/05/20 1342 08/06/20 0433  NA 140 141 141  K 3.9 3.9 3.4*  CL 101 101 103  CO2 30  --  27  GLUCOSE 184* 174* 149*  BUN 14 16 12   CREATININE 1.47* 1.30* 1.37*  CALCIUM 9.5  --  9.3   Recent Labs  Lab 08/05/20 1338 08/06/20 0433  AST 27 18  ALT 27 22  ALKPHOS 67 55  BILITOT 0.8 1.1  PROT 6.3* 5.5*  ALBUMIN 3.4* 3.1*   Recent Labs  Lab 08/05/20 1338 08/05/20 1342 08/06/20 0433  WBC 9.1  --  7.1  NEUTROABS 6.6  --  4.3  HGB 16.7 18.0* 15.8  HCT 53.5* 53.0* 49.4  MCV 93.2  --  92.2  PLT 173  --  183   Recent Labs  Lab 08/05/20 2122  CKTOTAL 77   Recent Labs    08/05/20 1338  LABPROT 14.5  INR 1.2   No results for input(s): COLORURINE, LABSPEC, PHURINE, GLUCOSEU, HGBUR, BILIRUBINUR, KETONESUR, PROTEINUR, UROBILINOGEN, NITRITE, LEUKOCYTESUR in the last 72 hours.  Invalid input(s): APPERANCEUR     Component Value Date/Time   CHOL 118 08/06/2020 0433   TRIG 209 (H) 08/06/2020 0433   HDL 31 (L)  08/06/2020 0433   CHOLHDL 3.8 08/06/2020 0433   VLDL 42 (H) 08/06/2020 0433   LDLCALC 45 08/06/2020 0433   Lab Results  Component Value Date   HGBA1C 7.4 (H) 08/06/2020   No results found for: LABOPIA, COCAINSCRNUR, LABBENZ, AMPHETMU, THCU, LABBARB  No results for input(s): ETH in the last 168 hours.  I have personally reviewed the radiological images below and agree with the radiology interpretations.  MR ANGIO HEAD WO CONTRAST  Result Date: 08/05/2020 CLINICAL DATA:  Initial evaluation for acute neuro deficit, stroke suspected. EXAM: MRI HEAD WITHOUT CONTRAST MRA HEAD WITHOUT CONTRAST TECHNIQUE: Multiplanar, multiecho pulse sequences of the brain and surrounding structures were obtained without intravenous contrast. Angiographic images of the head were obtained using MRA technique without contrast. COMPARISON:  Prior CT from earlier the same day as well as prior CTA from 06/09/2020. FINDINGS: MRI HEAD FINDINGS Brain: Diffuse prominence of the CSF containing spaces compatible with moderately advanced cerebral atrophy. Multiple associated dilated perivascular spaces noted about the bilateral basal ganglia. Patchy and confluent T2/FLAIR hyperintensity within the periventricular and deep white matter both cerebral hemispheres as well as the pons, most consistent with chronic small vessel ischemic disease, moderately advanced in nature. Few scattered  superimposed remote lacunar infarcts present about the hemispheric cerebral white matter. 11 mm focus of restricted diffusion seen involving the posterior right internal capsule/corona radiata, consistent with an acute ischemic infarct (series 3, image 27). No associated hemorrhage or mass effect. Additional apparent subtle focus of diffusion abnormality seen involving the posterior parasagittal right parietal region felt to be consistent with susceptibility artifact related to adjacent prominent dural calcification (series 3, image 35). No other foci of  diffusion abnormality to suggest acute or subacute ischemia. Gray-white matter differentiation otherwise maintained. No encephalomalacia to suggest chronic cortical infarction elsewhere within the brain. No acute intracranial hemorrhage. Few scattered chronic micro hemorrhages noted at the left frontotemporal region and right occipital lobe, likely hypertensive in nature. No mass lesion, midline shift or mass effect. Diffuse ventricular prominence related to global parenchymal volume loss of hydrocephalus. No extra-axial fluid collection. Pituitary gland within normal limits. Midline structures intact. Vascular: Major intracranial vascular flow voids are maintained. Skull and upper cervical spine: Craniocervical junction within normal limits. Bone marrow signal intensity normal. No scalp soft tissue abnormality. Sinuses/Orbits: Globes and orbital soft tissues within normal limits. Mild scattered mucosal thickening noted within the frontoethmoidal sinuses. Paranasal sinuses are otherwise largely clear. Trace bilateral mastoid effusions noted, of doubtful significance. Inner ear structures grossly normal. Other: None. MRA HEAD FINDINGS ANTERIOR CIRCULATION: Visualized distal cervical segments of the internal carotid arteries are widely patent with symmetric antegrade flow. Petrous segments patent bilaterally. Scattered atheromatous change throughout the carotid siphons. Associated moderate stenosis at the para clinoid left ICA, similar to previous (series 3, image 92). No significant stenosis about the right carotid siphon. A1 segments patent bilaterally. Normal anterior communicating artery complex. Visualized portions of the ACAs are widely patent. Short-segment moderate stenosis at the proximal left M1 segment (series 3, image 104). Both M1 segments irregular but otherwise widely patent. Normal MCA bifurcations. No proximal MCA branch occlusion. Diffuse small vessel atheromatous irregularity seen throughout the MCA  branches bilaterally. POSTERIOR CIRCULATION: Both V4 segments patent to the vertebrobasilar junction without stenosis. Right vertebral artery slightly dominant. Right PICA origin patent and normal. Left PICA origin not visualized. Basilar patent to its distal aspect without stenosis. Superior cerebellar arteries patent bilaterally. PCAs supplied via the basilar as well as small bilateral posterior communicating arteries. Probable short-segment severe stenosis at the distal right P2/P3 junction (series 354, image 13). No flow-limiting stenosis about the proximal left PCA. Diffuse small vessel atheromatous irregularity seen about the distal PCA branches bilaterally. No intracranial aneurysm. Overall, appearance of the intracranial circulation is relatively similar as compared to prior CTA from 06/09/2020. IMPRESSION: MRI HEAD IMPRESSION: 1. 11 mm acute ischemic nonhemorrhagic infarct involving the posterior right internal capsule/corona radiata. 2. Moderately advanced cerebral atrophy with chronic microvascular ischemic disease. MRA HEAD IMPRESSION: 1. Negative intracranial MRA for large vessel occlusion. 2. Moderate atherosclerotic change with associated moderate stenosis at the para clinoid left and proximal left M1 segment. Additional short-segment severe stenosis at the distal right P2/P3 junction. Overall, appearance is similar as compared to prior CTA from 06/09/2020. 3. Diffuse small vessel atheromatous irregularity about the distal MCA and PCA branches bilaterally. Electronically Signed   By: Jeannine Boga M.D.   On: 08/05/2020 21:09   MR BRAIN WO CONTRAST  Result Date: 08/05/2020 CLINICAL DATA:  Initial evaluation for acute neuro deficit, stroke suspected. EXAM: MRI HEAD WITHOUT CONTRAST MRA HEAD WITHOUT CONTRAST TECHNIQUE: Multiplanar, multiecho pulse sequences of the brain and surrounding structures were obtained without intravenous contrast. Angiographic images of the  head were obtained using  MRA technique without contrast. COMPARISON:  Prior CT from earlier the same day as well as prior CTA from 06/09/2020. FINDINGS: MRI HEAD FINDINGS Brain: Diffuse prominence of the CSF containing spaces compatible with moderately advanced cerebral atrophy. Multiple associated dilated perivascular spaces noted about the bilateral basal ganglia. Patchy and confluent T2/FLAIR hyperintensity within the periventricular and deep white matter both cerebral hemispheres as well as the pons, most consistent with chronic small vessel ischemic disease, moderately advanced in nature. Few scattered superimposed remote lacunar infarcts present about the hemispheric cerebral white matter. 11 mm focus of restricted diffusion seen involving the posterior right internal capsule/corona radiata, consistent with an acute ischemic infarct (series 3, image 27). No associated hemorrhage or mass effect. Additional apparent subtle focus of diffusion abnormality seen involving the posterior parasagittal right parietal region felt to be consistent with susceptibility artifact related to adjacent prominent dural calcification (series 3, image 35). No other foci of diffusion abnormality to suggest acute or subacute ischemia. Gray-white matter differentiation otherwise maintained. No encephalomalacia to suggest chronic cortical infarction elsewhere within the brain. No acute intracranial hemorrhage. Few scattered chronic micro hemorrhages noted at the left frontotemporal region and right occipital lobe, likely hypertensive in nature. No mass lesion, midline shift or mass effect. Diffuse ventricular prominence related to global parenchymal volume loss of hydrocephalus. No extra-axial fluid collection. Pituitary gland within normal limits. Midline structures intact. Vascular: Major intracranial vascular flow voids are maintained. Skull and upper cervical spine: Craniocervical junction within normal limits. Bone marrow signal intensity normal. No scalp  soft tissue abnormality. Sinuses/Orbits: Globes and orbital soft tissues within normal limits. Mild scattered mucosal thickening noted within the frontoethmoidal sinuses. Paranasal sinuses are otherwise largely clear. Trace bilateral mastoid effusions noted, of doubtful significance. Inner ear structures grossly normal. Other: None. MRA HEAD FINDINGS ANTERIOR CIRCULATION: Visualized distal cervical segments of the internal carotid arteries are widely patent with symmetric antegrade flow. Petrous segments patent bilaterally. Scattered atheromatous change throughout the carotid siphons. Associated moderate stenosis at the para clinoid left ICA, similar to previous (series 3, image 92). No significant stenosis about the right carotid siphon. A1 segments patent bilaterally. Normal anterior communicating artery complex. Visualized portions of the ACAs are widely patent. Short-segment moderate stenosis at the proximal left M1 segment (series 3, image 104). Both M1 segments irregular but otherwise widely patent. Normal MCA bifurcations. No proximal MCA branch occlusion. Diffuse small vessel atheromatous irregularity seen throughout the MCA branches bilaterally. POSTERIOR CIRCULATION: Both V4 segments patent to the vertebrobasilar junction without stenosis. Right vertebral artery slightly dominant. Right PICA origin patent and normal. Left PICA origin not visualized. Basilar patent to its distal aspect without stenosis. Superior cerebellar arteries patent bilaterally. PCAs supplied via the basilar as well as small bilateral posterior communicating arteries. Probable short-segment severe stenosis at the distal right P2/P3 junction (series 354, image 13). No flow-limiting stenosis about the proximal left PCA. Diffuse small vessel atheromatous irregularity seen about the distal PCA branches bilaterally. No intracranial aneurysm. Overall, appearance of the intracranial circulation is relatively similar as compared to prior CTA  from 06/09/2020. IMPRESSION: MRI HEAD IMPRESSION: 1. 11 mm acute ischemic nonhemorrhagic infarct involving the posterior right internal capsule/corona radiata. 2. Moderately advanced cerebral atrophy with chronic microvascular ischemic disease. MRA HEAD IMPRESSION: 1. Negative intracranial MRA for large vessel occlusion. 2. Moderate atherosclerotic change with associated moderate stenosis at the para clinoid left and proximal left M1 segment. Additional short-segment severe stenosis at the distal right P2/P3 junction. Overall,  appearance is similar as compared to prior CTA from 06/09/2020. 3. Diffuse small vessel atheromatous irregularity about the distal MCA and PCA branches bilaterally. Electronically Signed   By: Jeannine Boga M.D.   On: 08/05/2020 21:09   DG CHEST PORT 1 VIEW  Result Date: 08/05/2020 CLINICAL DATA:  Sided weakness EXAM: PORTABLE CHEST 1 VIEW COMPARISON:  05/03/2020 FINDINGS: Low lung volumes. No consolidation or effusion. Stable cardiomediastinal silhouette. No pneumothorax. IMPRESSION: Low lung volumes. Electronically Signed   By: Donavan Foil M.D.   On: 08/05/2020 19:03   CT HEAD CODE STROKE WO CONTRAST  Result Date: 08/05/2020 CLINICAL DATA:  Code stroke. Acute neuro deficit with left-sided weakness EXAM: CT HEAD WITHOUT CONTRAST TECHNIQUE: Contiguous axial images were obtained from the base of the skull through the vertex without intravenous contrast. COMPARISON:  CT head 06/09/2020 FINDINGS: Brain: Moderate atrophy. Negative for hydrocephalus. Moderate white matter hypodensity bilaterally unchanged. Negative for acute infarct or hemorrhage. 1 cm dural calcification left posterior falx unchanged. Vascular: Negative for hyperdense vessel Skull: Negative Sinuses/Orbits: Paranasal sinuses clear.  Negative orbit Other: None ASPECTS ([place] Stroke Program Early CT Score) - Ganglionic level infarction (caudate, lentiform nuclei, internal capsule, insula, M1-M3 cortex): 7 -  Supraganglionic infarction (M4-M6 cortex): 3 Total score (0-10 with 10 being normal): 10 IMPRESSION: 1. No acute abnormality no change from the prior CT 06/09/2020 2. ASPECTS is 10 3. Moderate atrophy and chronic microvascular ischemic change in the white matter. 4. Code stroke imaging results were communicated on 08/05/2020 at 1:47 pm to provider Bhagat via text page Electronically Signed   By: Franchot Gallo M.D.   On: 08/05/2020 13:48   VAS US CAROTID  Result Date: 07/13/2020 Carotid Arterial Duplex Study Indications:   Carotid artery disease, right endarterectomy and Right stent. Risk Factors:  Hypertension, Diabetes, no history of smoking, coronary artery                disease. Other Factors: Right TCAR procedure 06/12/2020. Performing Technologist: Delorise Shiner RVT  Examination Guidelines: A complete evaluation includes B-mode imaging, spectral Doppler, color Doppler, and power Doppler as needed of all accessible portions of each vessel. Bilateral testing is considered an integral part of a complete examination. Limited examinations for reoccurring indications may be performed as noted.  Right Carotid Findings: +----------+--------+--------+--------+------------------+--------+           PSV cm/sEDV cm/sStenosisPlaque DescriptionComments +----------+--------+--------+--------+------------------+--------+ CCA Prox  82      0                                          +----------+--------+--------+--------+------------------+--------+ CCA Mid   98      9       <50%    irregular                  +----------+--------+--------+--------+------------------+--------+ CCA Distal71      12                                stent    +----------+--------+--------+--------+------------------+--------+ ICA Prox  43      9       Normal                    stent    +----------+--------+--------+--------+------------------+--------+ ICA Mid   56      10  Normal                              +----------+--------+--------+--------+------------------+--------+ ICA Distal67      11                                         +----------+--------+--------+--------+------------------+--------+ ECA       119     0                                          +----------+--------+--------+--------+------------------+--------+ +----------+--------+-------+----------------+-------------------+           PSV cm/sEDV cmsDescribe        Arm Pressure (mmHG) +----------+--------+-------+----------------+-------------------+ [id number]      0      Multiphasic, WNL                    +----------+--------+-------+----------------+-------------------+ +---------+--------+--+--------+-+---------+ VertebralPSV cm/s21EDV cm/s7Antegrade +---------+--------+--+--------+-+---------+  Right Stent(s): +---------------+--------+--------+--------+--------+--------+ Right ICA/ CCA PSV cm/sEDV cm/sStenosisWaveformComments +---------------+--------+--------+--------+--------+--------+ Prox to Stent  71      12                               +---------------+--------+--------+--------+--------+--------+ Proximal Stent 56      7                                +---------------+--------+--------+--------+--------+--------+ Mid Stent      37      0                                +---------------+--------+--------+--------+--------+--------+ Distal Stent   49      11                               +---------------+--------+--------+--------+--------+--------+ Distal to Stent56      10                               +---------------+--------+--------+--------+--------+--------+   Left Carotid Findings: +----------+--------+--------+--------+------------------+--------+           PSV cm/sEDV cm/sStenosisPlaque DescriptionComments +----------+--------+--------+--------+------------------+--------+ CCA Prox  97      10                                          +----------+--------+--------+--------+------------------+--------+ CCA Mid   76      16      <50%                               +----------+--------+--------+--------+------------------+--------+ CCA Distal88      15      <50%                               +----------+--------+--------+--------+------------------+--------+ ICA Prox  65      13  1-39%   calcific                   +----------+--------+--------+--------+------------------+--------+ ICA Mid   60      13                                         +----------+--------+--------+--------+------------------+--------+ ICA Distal68      18                                         +----------+--------+--------+--------+------------------+--------+ ECA       229     0       >50%                               +----------+--------+--------+--------+------------------+--------+ +----------+--------+--------+----------------+-------------------+           PSV cm/sEDV cm/sDescribe        Arm Pressure (mmHG) +----------+--------+--------+----------------+-------------------+ [id number]     0       Multiphasic, WNL                    +----------+--------+--------+----------------+-------------------+ +---------+--------+--+--------+-+---------+ VertebralPSV cm/s30EDV cm/s8Antegrade +---------+--------+--+--------+-+---------+   Summary: Right Carotid: There is no evidence of stenosis in the right ICA.                Non-hemodynamically significant plaque <50% noted in the CCA. Left Carotid: Velocities in the left ICA are consistent with a 1-39% stenosis.               The ECA appears >50% stenosed. Vertebrals:  Bilateral vertebral arteries demonstrate antegrade flow. Subclavians: Normal flow hemodynamics were seen in bilateral subclavian              arteries. *See table(s) above for measurements and observations.  Electronically signed by Harold Barban MD on 07/13/2020 at 8:08:18 PM.    Final      PHYSICAL EXAM  Temp:  [97.7 F (36.5 C)-98.7 F (37.1 C)] 98.5 F (36.9 C) (01/27 1145) Pulse Rate:  [52-85] 67 (01/27 1145) Resp:  [12-25] 20 (01/27 1145) BP: (131-172)/(63-117) 151/83 (01/27 1145) SpO2:  [91 %-97 %] 94 % (01/27 1145) Weight:  [138.8 kg] 138.8 kg (01/26 1400)  General - morbid obesity, well developed, in no apparent distress.  Ophthalmologic - fundi not visualized due to noncooperation.  Cardiovascular - irregularly irregular heart rate and rhythm.  Mental Status -  Level of arousal and orientation to time, place, and age were intact. Language including expression, naming, repetition, comprehension was assessed and found intact. Slight dysarthria  Cranial Nerves II - XII - II - Visual field intact OS, legally blind OD. III, IV, VI - Extraocular movements intact. V - Facial sensation intact bilaterally. VII - left facial mild droop. VIII - Hearing & vestibular intact bilaterally. X - Palate elevates symmetrically. Slight dysarthria XI - Chin turning & shoulder shrug intact bilaterally. XII - Tongue protrusion intact.  Motor Strength - The patient's strength was normal in right upper and lower extremities and LUE briefly against gravity with drift to bed within 10 sec, finger grip 2+/5. LLE proximal 2/5, not against gravity, distally 0/5.  Bulk was normal and fasciculations were absent.   Motor Tone - Muscle tone was assessed at the  neck and appendages and was normal.  Reflexes - The patient's reflexes were diminished in all extremities and he had no pathological reflexes.  Sensory - Light touch, temperature/pinprick were assessed and were symmetrical except decreased on the left UE than right.    Coordination - The patient had normal movements in the right hands with no ataxia or dysmetria.  Tremor was absent.  Gait and Station - deferred.   ASSESSMENT/PLAN [name] is a 85 y.o. male with history of CAD, HTN, HLD, OSA, DM, CHF, PVD, A.  fib on Eliquis, CKD, obesity, Covid in 11/2018, right CRAO 06/2019 admitted for left-sided arm leg weakness, left facial droop.. No tPA given due to also window.    Stroke:  right PLIC infarct, likely secondary to small vessel disease source  Resultant left hemiparesis  CT head no acute abnormality  MRI right PLIC small infarct  MRA no LVO.  Moderate stenosis at left paraclinoid ICA and left proximal M1.  Short segment severe stenosis at the distal P2/P3.  Overall appearance is similar compared to prior CTA on 06/09/2020.  Diffuse small vessel atherosclerosis distal MCA and PCA branches.  Carotid Doppler 07/13/2020 - no significant stenosis bilaterally  2D Echo pending, last TTE 06/2020 EF 55 to 60%.  LDL 45  HgbA1c 7.4  Eliquis for VTE prophylaxis  aspirin 81 mg daily and Eliquis (apixaban) daily prior to admission, now on aspirin 81 mg daily and Eliquis (apixaban) daily.  Given improved creatinine, consult pharmacy for appropriate dosing  Ongoing aggressive stroke risk factor management  Therapy recommendations:  SNF  Disposition:  pending  History of a stroke/carotid stenosis  06/2020 admitted for right CRAO.  MRI also found punctate right paracentral gyrus infarct.  CTA head and neck right ICA 70% stenosis with soft and calcified plaques.  Intracranial stenosis.  EF 55 to 60%.  LDL 51, A1c 8.1.  Patient had a TCAR procedure on the right with vascular surgery.  He was discharged on Eliquis, aspirin and Plavix as well as Lipitor 40.  Follow-up with [facility] and neurology as outpt  Plavix discontinued 1 month after procedure  On aspirin and Eliquis PTA.  Afib  Rate controlled  On Eliquis 2.5 twice daily PTA  Cre 1.30->1.37  Consulted pharmacy for appropriate dosing - consider 5mg  bid  Covid infection  11/2018 Covid positive  This admission Covid positive again - asymptomatic  Diabetes  HgbA1c 7.4 goal < 7.0  Uncontrolled  CBG monitoring  SSI  DM education and  close PCP follow up  Hypertension . Stable  Long term BP goal normotensive  Hyperlipidemia  Home meds: Lipitor 40  LDL 45, goal < 70  Now on Lipitor 40  Continue statin at discharge  Other Stroke Risk Factors  Advanced age  Obesity, Body mass index is 42.68 kg/m.   Coronary artery disease  Obstructive sleep apnea, on CPAP at home  PVD  Other Active Problems  CKD - cre improved from prior  Hospital day # 0  Neurology will sign off. Please call with questions. Pt will follow up with stroke clinic Jessica NP at [facility] in about 4 weeks. Thanks for the consult.   Rosalin Hawking, MD PhD Stroke Neurology 08/06/2020 12:32 PM    To contact Stroke Continuity provider, please refer to [email or web address] After hours, contact General Neurology

## 2020-08-06 NOTE — Progress Notes (Signed)
Family Medicine Teaching Service Daily Progress Note Intern Pager: [phone]  Patient name: Reginald [name] Medical record number: [id number] Date of birth: 30-Dec-1935 Age: 85 y.o. Gender: male  Primary Care Provider: [name], MD Consultants: Neurology Code Status: Full  Pt Overview and Major Events to Date:  Admitted 1/26  Assessment and Plan: Reginald [name] is a 85 y.o. male presenting with left sided weakness and concern for stroke . PMH is significant for CAD, CHF, morbid obesity, CKD, COPD, CVA, OSA, hypertension, hypercholesterolemia, diabetes.  CVA Labs grossly normal.  MRI/MRA show 11 mm acute ischemic nonhemorrhagic infarct involving the posterior right internal capsule/corona radiata.  Negative intracranial MRA for large vessel occlusion.  Moderate atherosclerotic change with associated moderate stenosis at the para clinoid left and proximal left M1 segment. Additional short-segment severe stenosis at the distal right P2/P3 junction. Neuro following for stroke management.  Arm strength improved from yesterday to 4/5 on my exam.  Leg strength 0/5 though this may have been limited due to bed.  Plan to work with PT/OT today.  Passed swallow study. - Neuro following, appreciate recs - Obtain ECHO - SBP goal - Permissive hypertension first 24 h <220/110. Hold home Metoprolol for now - PT/OT eval and treat - f/u UA with UCx, CXR, CK  COVID-19 Patient currently asymptomatic.  Denies cough, chest pain, difficulty breathing, n/v/d or other COVID symptoms.  Sore throat resolved with PO fluid intake.  Do not recommend starting remdesevir at this time as it has no studied clinical benefit in patient's without COVID symptoms. - Continuous O2 monitoring - Maintain O2 levels >90%  Type 2 diabetes: Last A1C on 05/28/20 was 8.1.  Home medications include insulin sliding scale, Trulicity 3 mg weekly, on Mondays and Prandin 0.5 daily. -Monitor CBGs -Sensitive sliding  scale  Atrial fibrillation Current heart rate 72.  Irregular rhthym but rate well controlled.   Home medications include Eliquis and Metoprolol. - Continue Eliquis 2.5 BID - Hold Metoprolol  HFpEF Most recent echo 06/10/2020 with ejection fraction 55-60% with moderate left ventricular hypertrophy and left ventricular diastolic parameters being indeterminate.  At previous echoes the patient has had grade 2 diastolic dysfunction.  Home medications include metoprolol succinate 12.5 mg daily. - Hold home Metoprolol for permissive hypertension - Obtain Repeat Echo  History of CAD Patient reports MI back in 1997 with stent placement.  Denies repeat even since.  On Aspirin 81 mg daily. - Continue Home Aspirin  CKD stage IIIa Current creatinine 1.37, baseline appears to range somewhere between 1.5 and 2 per chart review. - Avoid Renal-toxic meds  Hypertension Current BP stable in 130's.  Home medications include metoprolol succinate 12.5 mg daily. - Neuro Recs for SBP goal  - Permissive hypertension first 24 h <220/110.  - Hold home Metoprolol  Hyperlipidemia Last lipid panel on 06/11/20.  LDL-51.  On Atorvastatin 40 mg at home. - Continue home Atorvstatin  BPH  bladder spasms Home medication include Flomax 0.4 mg/day as well as oxybutynin.  Has urine incoontinence at baseline. - Continue home medications  Hypokalemia Decreased to 3.4 this morning. - 20 mg Potassium  FEN/GI: Regular Diet Prophylaxis: Eliquis   Status is: Observation  The patient will require care spanning > 2 midnights and should be moved to inpatient because: Inpatient level of care appropriate due to severity of illness  Dispo:  Patient From: [facility]  Planned Disposition: SNF  Expected discharge date: 08/10/2020  Medically stable for discharge: No  Subjective:  Patient still reports weakness in arm and leg.  Indicates sore throat has resolved since   Objective: Temp:   [97.7 F (36.5 C)-98.2 F (36.8 C)] 98.2 F (36.8 C) (01/27 0358) Pulse Rate:  [52-85] 72 (01/27 0358) Resp:  [12-25] 20 (01/27 0358) BP: (131-172)/(63-117) 140/76 (01/27 0358) SpO2:  [91 %-97 %] 94 % (01/27 0358) Weight:  [138.8 kg] 138.8 kg (01/26 1400) Physical Exam:  Physical Exam Constitutional:      General: He is not in acute distress.    Appearance: Normal appearance. He is not ill-appearing.  HENT:     Mouth/Throat:     Mouth: Mucous membranes are moist.  Cardiovascular:     Rate and Rhythm: Normal rate. Rhythm irregular.  Abdominal:     General: Abdomen is flat.     Palpations: Abdomen is soft.  Skin:    General: Skin is warm.  Neurological:     Mental Status: He is alert and oriented to person, place, and time.     Comments: Right side normal.  Left arm strength improved from yesterday but still weak at 4/5.  Left leg 0/5 strength, though may be limited due to position in bed.     Laboratory: Recent Labs  Lab 08/05/20 1338 08/05/20 1342 08/06/20 0433  WBC 9.1  --  7.1  HGB 16.7 18.0* 15.8  HCT 53.5* 53.0* 49.4  PLT 173  --  183   Recent Labs  Lab 08/05/20 1338 08/05/20 1342 08/06/20 0433  NA 140 141 141  K 3.9 3.9 3.4*  CL 101 101 103  CO2 30  --  27  BUN 14 16 12   CREATININE 1.47* 1.30* 1.37*  CALCIUM 9.5  --  9.3  PROT 6.3*  --  5.5*  BILITOT 0.8  --  1.1  ALKPHOS 67  --  55  ALT 27  --  22  AST 27  --  18  GLUCOSE 184* 174* 149*     Imaging/Diagnostic Tests:  EXAM: MRI HEAD WITHOUT CONTRAST  MRA HEAD WITHOUT CONTRAST  TECHNIQUE: Multiplanar, multiecho pulse sequences of the brain and surrounding structures were obtained without intravenous contrast. Angiographic images of the head were obtained using MRA technique without contrast.  COMPARISON:  Prior CT from earlier the same day as well as prior CTA from 06/09/2020.  FINDINGS: MRI HEAD FINDINGS  Brain: Diffuse prominence of the CSF containing spaces compatible with  moderately advanced cerebral atrophy. Multiple associated dilated perivascular spaces noted about the bilateral basal ganglia. Patchy and confluent T2/FLAIR hyperintensity within the periventricular and deep white matter both cerebral hemispheres as well as the pons, most consistent with chronic small vessel ischemic disease, moderately advanced in nature. Few scattered superimposed remote lacunar infarcts present about the hemispheric cerebral white matter.  11 mm focus of restricted diffusion seen involving the posterior right internal capsule/corona radiata, consistent with an acute ischemic infarct (series 3, image 27). No associated hemorrhage or mass effect. Additional apparent subtle focus of diffusion abnormality seen involving the posterior parasagittal right parietal region felt to be consistent with susceptibility artifact related to adjacent prominent dural calcification (series 3, image 35).  No other foci of diffusion abnormality to suggest acute or subacute ischemia. Gray-white matter differentiation otherwise maintained. No encephalomalacia to suggest chronic cortical infarction elsewhere within the brain. No acute intracranial hemorrhage. Few scattered chronic micro hemorrhages noted at the left frontotemporal region and right occipital lobe, likely hypertensive in nature.  No mass lesion, midline shift or mass  effect. Diffuse ventricular prominence related to global parenchymal volume loss of hydrocephalus. No extra-axial fluid collection. Pituitary gland within normal limits. Midline structures intact.  Vascular: Major intracranial vascular flow voids are maintained.  Skull and upper cervical spine: Craniocervical junction within normal limits. Bone marrow signal intensity normal. No scalp soft tissue abnormality.  Sinuses/Orbits: Globes and orbital soft tissues within normal limits. Mild scattered mucosal thickening noted within the frontoethmoidal  sinuses. Paranasal sinuses are otherwise largely clear. Trace bilateral mastoid effusions noted, of doubtful significance. Inner ear structures grossly normal.  Other: None.  MRA HEAD FINDINGS  ANTERIOR CIRCULATION:  Visualized distal cervical segments of the internal carotid arteries are widely patent with symmetric antegrade flow. Petrous segments patent bilaterally. Scattered atheromatous change throughout the carotid siphons. Associated moderate stenosis at the para clinoid left ICA, similar to previous (series 3, image 92). No significant stenosis about the right carotid siphon. A1 segments patent bilaterally. Normal anterior communicating artery complex. Visualized portions of the ACAs are widely patent.  Short-segment moderate stenosis at the proximal left M1 segment (series 3, image 104). Both M1 segments irregular but otherwise widely patent. Normal MCA bifurcations. No proximal MCA branch occlusion. Diffuse small vessel atheromatous irregularity seen throughout the MCA branches bilaterally.  POSTERIOR CIRCULATION:  Both V4 segments patent to the vertebrobasilar junction without stenosis. Right vertebral artery slightly dominant. Right PICA origin patent and normal. Left PICA origin not visualized. Basilar patent to its distal aspect without stenosis. Superior cerebellar arteries patent bilaterally. PCAs supplied via the basilar as well as small bilateral posterior communicating arteries. Probable short-segment severe stenosis at the distal right P2/P3 junction (series 354, image 13). No flow-limiting stenosis about the proximal left PCA. Diffuse small vessel atheromatous irregularity seen about the distal PCA branches bilaterally.  No intracranial aneurysm.  Overall, appearance of the intracranial circulation is relatively similar as compared to prior CTA from 06/09/2020.  IMPRESSION: MRI HEAD IMPRESSION:  1. 11 mm acute ischemic nonhemorrhagic  infarct involving the posterior right internal capsule/corona radiata. 2. Moderately advanced cerebral atrophy with chronic microvascular ischemic disease.  MRA HEAD IMPRESSION:  1. Negative intracranial MRA for large vessel occlusion. 2. Moderate atherosclerotic change with associated moderate stenosis at the para clinoid left and proximal left M1 segment. Additional short-segment severe stenosis at the distal right P2/P3 junction. Overall, appearance is similar as compared to prior CTA from 06/09/2020. 3. Diffuse small vessel atheromatous irregularity about the distal MCA and PCA branches bilaterally.  Delora Fuel, MD 08/06/2020, 11:51 AM PGY-1, [place] Intern pager: [phone], text pages welcome

## 2020-08-06 NOTE — Evaluation (Signed)
Occupational Therapy Evaluation Patient Details Name: Reginald [name] MRN: [id number] DOB: 03-10-1936 Today's Date: 08/06/2020    History of Present Illness 85 y.o. male presenting with left sided weakness and concern for stroke. MRI (+) 22mm acute ischemic nonhemorhagic infarct in posterior R internal capsule/corona radiata. PMH is significant for previous CVA 12/21 w/ residual R visual deficits, Hx of Covid-19, CAD, CHF, morbid obesity, CKD, COPD, CVA, OSA, hypertension, hypercholesterolemia, diabetes.   Clinical Impression   PTA patient was a LTC resident at [facility] and was recently active with rehab services after CVA in 12/21. Patient reports being assist from staff at baseline for ADLs including dressing, toileting and bathing and reports that he was able to complete squat-pivot transfers with 1 person assist. 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 Functional transfers deferred for patient/therapist safety. Will attempt functional transfers during next session with plan for +2 assist. Recommendation for return to SNF with therapy services.     Follow Up Recommendations  SNF;Supervision/Assistance - 24 hour (Return to [facility])    Equipment Recommendations  Other (comment) (Defer to next level of care)    Recommendations for Other Services       Precautions / Restrictions Precautions Precautions: Fall Precaution Comments: HOH, blindess in R eye from previous CVA, L-hemi Restrictions Weight Bearing Restrictions: No      Mobility Bed Mobility Overal bed mobility: Needs Assistance Bed Mobility: Rolling Rolling: Max assist         General bed mobility comments: Max A for rolling to L to place bedpan.    Transfers Overall transfer level: Needs assistance               General  transfer comment: Deferred for patient/therapist safety.    Balance                                           ADL either performed or assessed with clinical judgement   ADL Overall ADL's : Needs assistance/impaired                 Upper Body Dressing : Total assistance;Bed level   Lower Body Dressing: Total assistance;+2 for physical assistance;+2 for safety/equipment;Bed level                       Vision Baseline Vision/History: Wears glasses Wears Glasses: At all times Patient Visual Report: No change from baseline Vision Assessment?: Yes Eye Alignment: Within Functional Limits Additional Comments: Blindness in R eye at baseline from previous CVA.     Perception Perception Perception Tested?: Yes   Praxis Praxis Praxis tested?: Within functional limits    Pertinent Vitals/Pain Pain Assessment: No/denies pain     Hand Dominance Right   Extremity/Trunk Assessment Upper Extremity Assessment Upper Extremity Assessment: LUE deficits/detail   Lower Extremity Assessment Lower Extremity Assessment: Defer to PT evaluation   Cervical / Trunk Assessment Cervical / Trunk Assessment: Normal   Communication Communication Communication: No difficulties;HOH   Cognition Arousal/Alertness: Awake/alert Behavior During Therapy: WFL for tasks assessed/performed Overall Cognitive Status: Within Functional Limits for tasks assessed  General Comments  SpO2 90-94% on RA. Patient reports using O2 PRN at facility but isn't sure how many L.    Exercises     Shoulder Instructions      Home Living Family/patient expects to be discharged to:: Skilled nursing facility                                 Additional Comments: [facility]      Prior Functioning/Environment Level of Independence: Needs assistance  Gait / Transfers Assistance Needed: Wheelchair bound at baseline.  Could transfer to wheelchair via squat-pivot transfer with +1 assist. Reports use of RW for short-distance several months ago. Has recently spent most time in wheelchair. Could self-propel but preferred help. ADL's / Homemaking Assistance Needed: Assist wtih ADLs at baseline with the exception of self-feeding.            OT Problem List: Decreased strength;Decreased range of motion;Impaired balance (sitting and/or standing);Impaired vision/perception;Decreased coordination;Cardiopulmonary status limiting activity;Impaired sensation;Impaired tone;Obesity;Impaired UE functional use      OT Treatment/Interventions: Self-care/ADL training;Therapeutic exercise;Neuromuscular education;Energy conservation;DME and/or AE instruction;Therapeutic activities;Patient/family education;Balance training    OT Goals(Current goals can be found in the care plan section) Acute Rehab OT Goals Patient Stated Goal: To return to SNF OT Goal Formulation: With patient Time For Goal Achievement: 08/20/20 Potential to Achieve Goals: Fair ADL Goals Additional ADL Goal #1: Patient will maintain static sitting balance at EOB with close supervision A in prep for seated ADLs. Additional ADL Goal #2: Patient will demonstrate self-AAROM LUE HEP with focus on NMR to maximize independence with grooming and self-feeding tasks. Additional ADL Goal #3: Patient will transfer to recliner/wheelchair via squat-pivot with Mod A +2 and good safety awareness.  OT Frequency: Min 2X/week   Barriers to D/C:            Co-evaluation              AM-PAC OT "6 Clicks" Daily Activity     Outcome Measure Help from another person eating meals?: A Little Help from another person taking care of personal grooming?: A Lot Help from another person toileting, which includes using toliet, bedpan, or urinal?: Total Help from another person bathing (including washing, rinsing, drying)?: Total Help from another person to put on and taking  off regular upper body clothing?: A Lot Help from another person to put on and taking off regular lower body clothing?: Total 6 Click Score: 10   End of Session Nurse Communication: Mobility status;Need for lift equipment;Other (comment) ([place])  Activity Tolerance: Patient tolerated treatment well Patient left: in bed;with call bell/phone within reach;with bed alarm set  OT Visit Diagnosis: Other abnormalities of gait and mobility (R26.89);Muscle weakness (generalized) (M62.81);History of falling (Z91.81);Hemiplegia and hemiparesis Hemiplegia - Right/Left: Left Hemiplegia - dominant/non-dominant: Non-Dominant Hemiplegia - caused by: Cerebral infarction                Time: 0939-1010 OT Time Calculation (min): 31 min Charges:  OT General Charges $OT Visit: 1 Visit OT Evaluation $OT Eval Moderate Complexity: 1 Mod OT Treatments $Self Care/Home Management : 8-22 mins  Lorenza Winkleman H. OTR/L Supplemental OT, Department of rehab services [phone]  Cadi Rhinehart R H. 08/06/2020, 10:23 AM

## 2020-08-06 NOTE — NC FL2 (Addendum)
[place] LEVEL OF CARE SCREENING TOOL     IDENTIFICATION  Patient Name: Reginald [name] Birthdate: 09-23-1935 Sex: male Admission Date (Current Location): 08/05/2020  [facility] and [place] Number:  Herbalist and Address:  [facility], [place] [address] [place], [place] [address]      Provider Number: [id number]  Attending Physician Name and Address:  Zenia Resides, MD  Relative Name and Phone Number:       Current Level of Care: Hospital Recommended Level of Care: [place] Prior Approval Number:    Date Approved/Denied:   PASRR Number: [id number]  Discharge Plan: SNF    Current Diagnoses: Patient Active Problem List   Diagnosis Date Noted  . Stroke ([place]) 08/05/2020  . Vision loss of right eye 06/09/2020  . Acute on chronic renal failure ([place]) 12/19/2018  . Acute renal failure superimposed on stage 4 chronic kidney disease ([place]) 12/19/2018  . AKI (acute kidney injury) ([place]) 11/26/2018  . HCAP (healthcare-associated pneumonia) 11/15/2018  . COVID-19 11/15/2018  . Chronic venous insufficiency 10/04/2017  . Venous stasis ulcers of both lower extremities ([place]) 10/04/2017  . Nonspecific chest pain 10/27/2016  . Morbid obesity ([place]) 05/02/2016  . Lymphedema 05/02/2016  . Abnormal laboratory test result 10/15/2015  . Cough 10/08/2015  . Hematuria 09/21/2015  . Hypokalemia 08/13/2015  . HTN (hypertension) 08/13/2015  . Multiple open wounds of lower leg 07/22/2015  . CKD (chronic kidney disease) stage 4, GFR 15-29 ml/min (HCC) 07/22/2015  . Cellulitis of leg, right 06/28/2015  . ARF (acute renal failure) ([place]) 06/28/2015  . Hypertensive heart disease with CHF (congestive heart failure) ([place]) 05/19/2015  . CAD (coronary artery disease), native coronary artery 05/19/2015  . Hyperlipidemia 05/19/2015  . Vitamin D deficiency 05/19/2015  . Pressure ulcer 05/16/2015  . Diastolic dysfunction with chronic  heart failure ([place]) 05/08/2015  . Blisters of multiple sites 05/08/2015  . Type 2 diabetes mellitus ([place])   . Obesity, unspecified   . OSA (obstructive sleep apnea)   . Atrial fibrillation ([place])   . Secondary DM with CKD stage 4 and hypertension ([place])   . Glaucoma   . Cellulitis 05/07/2015    Orientation RESPIRATION BLADDER Height & Weight     Self,Situation,Place  Normal Incontinent Weight: (!) 306 lb (138.8 kg) Height:  5\' 11"  (180.3 cm)  BEHAVIORAL SYMPTOMS/MOOD NEUROLOGICAL BOWEL NUTRITION STATUS      Continent Diet (heart healthy/carb modified)  AMBULATORY STATUS COMMUNICATION OF NEEDS Skin   Extensive Assist Verbally Skin abrasions (head)                       Personal Care Assistance Level of Assistance  Bathing,Feeding,Dressing Bathing Assistance: Maximum assistance Feeding assistance: Limited assistance Dressing Assistance: Maximum assistance     Functional Limitations Info             SPECIAL CARE FACTORS FREQUENCY  PT (By licensed PT),OT (By licensed OT)     PT Frequency: 5x/wk OT Frequency: 5x/wk            Contractures Contractures Info: Not present    Additional Factors Info  Code Status,Allergies,Isolation Precautions Code Status Info: Full Allergies Info: Adhesive (Tape)  Airborne/Contact: COVID-19         Current Medications (08/06/2020):  This is the current hospital active medication list Current Facility-Administered Medications  Medication Dose Route Frequency Provider Last Rate Last Admin  . apixaban (ELIQUIS) tablet 2.5 mg  2.5 mg Oral BID Delora Fuel, MD   2.5 mg at 08/06/20 0842  . aspirin EC tablet 81 mg  81 mg Oral Daily Delora Fuel, MD   81 mg at 08/06/20 7614  . atorvastatin (LIPITOR) tablet 40 mg  40 mg Oral QHS Delora Fuel, MD   40 mg at 08/05/20 2258  . gabapentin (NEURONTIN) capsule 200 mg  200 mg Oral QHS Maness, Philip, MD   200 mg at 08/05/20 2258  . insulin aspart (novoLOG) injection 0-9 Units  0-9 Units  Subcutaneous TID WC Delora Fuel, MD   2 Units at 08/06/20 [phone]  . latanoprost (XALATAN) 0.005 % ophthalmic solution 1 drop  1 drop Both Eyes QHS Maness, Philip, MD      . oxybutynin (DITROPAN) tablet 5 mg  5 mg Oral Q8H PRN Maness, Philip, MD      . potassium chloride SA (KLOR-CON) CR tablet 20 mEq  20 mEq Oral Once Alcus Dad, MD      . sodium chloride flush (NS) 0.9 % injection 3 mL  3 mL Intravenous Once Blanchie Dessert, MD      . tamsulosin (FLOMAX) capsule 0.4 mg  0.4 mg Oral QPC supper Delora Fuel, MD         Discharge Medications: Please see discharge summary for a list of discharge medications.  Relevant Imaging Results:  Relevant Lab Results:   Additional Information SS#: [id number]  Geralynn Ochs, LCSW

## 2020-08-06 NOTE — Progress Notes (Signed)
  Echocardiogram 2D Echocardiogram has been performed.  Reginald [name] 08/06/2020, 3:33 PM

## 2020-08-06 NOTE — Plan of Care (Signed)
  Problem: Education: Goal: Knowledge of General Education information will improve Description Including pain rating scale, medication(s)/side effects and non-pharmacologic comfort measures Outcome: Progressing   

## 2020-08-06 NOTE — Plan of Care (Signed)
  Problem: Safety: Goal: Ability to remain free from injury will improve 08/06/2020 1338 by Dorris Carnes, RN Outcome: Progressing 08/06/2020 1337 by Dorris Carnes, RN Outcome: Progressing   Problem: Ischemic Stroke/TIA Tissue Perfusion: Goal: Complications of ischemic stroke/TIA will be minimized Outcome: Progressing

## 2020-08-06 NOTE — TOC Initial Note (Signed)
Transition of Care [facility]) - Initial/Assessment Note    Patient Details  Name: Reginald [name] MRN: [id number] Date of Birth: 11/02/1935  Transition of Care [facility] CM/SW Contact:    Geralynn Ochs, LCSW Phone Number: 08/06/2020, 11:50 AM  Clinical Narrative:    Patient from [facility], has lived there for years and plan is to return when stable. Patient will continue rehab upon return. CSW to follow.               Expected Discharge Plan: [place] Barriers to Discharge: Continued Medical Work up   Patient Goals and CMS Choice Patient states their goals for this hospitalization and ongoing recovery are:: to get back to [facility].gov Compare Post Acute Care list provided to:: Patient Choice offered to / list presented to : Patient  Expected Discharge Plan and Services Expected Discharge Plan: [place] Choice: [place] Living arrangements for the past 2 months: [place]                                      Prior Living Arrangements/Services Living arrangements for the past 2 months: [place] Lives with:: Facility Resident Patient language and need for interpreter reviewed:: No Do you feel safe going back to the place where you live?: Yes      Need for Family Participation in Patient Care: No (Comment) Care giver support system in place?: Yes (comment)   Criminal Activity/Legal Involvement Pertinent to Current Situation/Hospitalization: No - Comment as needed  Activities of Daily Living      Permission Sought/Granted Permission sought to share information with : Facility Retail banker granted to share information with : Yes, Verbal Permission Granted  Share Information with NAME: Vonda Antigua  Permission granted to share info w AGENCY: [place] granted to share info w Relationship: Children      Emotional Assessment       Orientation: : Oriented to Self,Oriented to Place,Oriented to Situation      Admission diagnosis:  Stroke [facility]) [I63.9] Cerebrovascular accident (CVA), unspecified mechanism ([place]) [I63.9] Patient Active Problem List   Diagnosis Date Noted  . Stroke ([place]) 08/05/2020  . Vision loss of right eye 06/09/2020  . Acute on chronic renal failure ([place]) 12/19/2018  . Acute renal failure superimposed on stage 4 chronic kidney disease ([place]) 12/19/2018  . AKI (acute kidney injury) ([place]) 11/26/2018  . HCAP (healthcare-associated pneumonia) 11/15/2018  . COVID-19 11/15/2018  . Chronic venous insufficiency 10/04/2017  . Venous stasis ulcers of both lower extremities ([place]) 10/04/2017  . Nonspecific chest pain 10/27/2016  . Morbid obesity ([place]) 05/02/2016  . Lymphedema 05/02/2016  . Abnormal laboratory test result 10/15/2015  . Cough 10/08/2015  . Hematuria 09/21/2015  . Hypokalemia 08/13/2015  . HTN (hypertension) 08/13/2015  . Multiple open wounds of lower leg 07/22/2015  . CKD (chronic kidney disease) stage 4, GFR 15-29 ml/min (HCC) 07/22/2015  . Cellulitis of leg, right 06/28/2015  . ARF (acute renal failure) ([place]) 06/28/2015  . Hypertensive heart disease with CHF (congestive heart failure) ([place]) 05/19/2015  . CAD (coronary artery disease), native coronary artery 05/19/2015  . Hyperlipidemia 05/19/2015  . Vitamin D deficiency 05/19/2015  . Pressure ulcer 05/16/2015  . Diastolic dysfunction with chronic heart failure ([place]) 05/08/2015  . Blisters of multiple sites 05/08/2015  . Type 2  diabetes mellitus ([place])   . Obesity, unspecified   . OSA (obstructive sleep apnea)   . Atrial fibrillation ([place])   . Secondary DM with CKD stage 4 and hypertension ([place])   . Glaucoma   . Cellulitis 05/07/2015   PCP:  [name], MD Pharmacy:   [place], [place] [address] [place] [address] Phone:  [phone] Fax: [place], [place] [address] Phone: [phone] Fax: [phone]     Social Determinants of Health (SDOH) Interventions    Readmission Risk Interventions No flowsheet data found.

## 2020-08-06 NOTE — Progress Notes (Signed)
[place] for apixaban Indication: atrial fibrillation with history of stroke  Allergies  Allergen Reactions  . Adhesive [Tape] Rash    Patient Measurements: Height: 5\' 11"  (180.3 cm) Weight: (!) 138.8 kg (306 lb) IBW/kg (Calculated) : 75.3  Vital Signs: Temp: 98.2 F (36.8 C) (01/27 0358) Temp Source: Oral (01/27 0358) BP: 140/76 (01/27 0358) Pulse Rate: 72 (01/27 0358)  Labs: Recent Labs    08/05/20 1338 08/05/20 1342 08/05/20 2122 08/06/20 0433  HGB 16.7 18.0*  --  15.8  HCT 53.5* 53.0*  --  49.4  PLT 173  --   --  183  APTT 36  --   --   --   LABPROT 14.5  --   --   --   INR 1.2  --   --   --   CREATININE 1.47* 1.30*  --  1.37*  CKTOTAL  --   --  77  --     Estimated Creatinine Clearance: 59.2 mL/min (A) (by C-G formula based on SCr of 1.37 mg/dL (H)).   Medical History: Past Medical History:  Diagnosis Date  . Anteroseptal myocardial infarction ([place])   . Carotid artery occlusion   . CHF (congestive heart failure) ([place])   . Chronic kidney disease   . COPD (chronic obstructive pulmonary disease) ([place])   . Coronary atherosclerosis of unspecified type of vessel, native or graft    a. s/p prior PCI to LAD in 1997  . Diverticulosis   . ED (erectile dysfunction)   . Foley catheter in place   . Glaucoma   . Hematuria   . History of 2019 novel coronavirus disease (COVID-19) 11/2018  . Hydronephrosis, bilateral   . Hydroureter   . Hypercholesteremia   . Hypertensive renal disease   . Hypogonadism male   . Hypokalemia   . Obesity hypoventilation syndrome ([place])   . Obesity, unspecified   . OSA (obstructive sleep apnea)   . Peripheral vascular disease ([place])   . Permanent atrial fibrillation ([place])   . Phimosis   . Pneumonia   . Rhabdomyolysis   . Tremor   . Type II or unspecified type diabetes mellitus without mention of complication, not stated as uncontrolled   . Unspecified essential hypertension   . Urinary  incontinence   . Urinary retention   . Vestibulopathy     Assessment: 85 YOM with history of atrial fibrillation and recent CVA 05/2020 on Eliquis 2.5mg  twice daily presenting as code stroke. Pharmacy has been consulted to dose Eliquis.   Patient's Scr 1.37 (improved from >1.5 since last admission), age 85 years, weight 138.8 kg.  Goal of Therapy:  Monitor platelets by anticoagulation protocol: Yes   Plan:  Increase Eliquis to 5mg  twice daily  Monitor renal function, s/s bleeding  Romilda Garret, PharmD PGY1 Acute Care Pharmacy Resident 08/06/2020 12:05 PM  Please check [email or web address] for unit specific pharmacy phone numbers.

## 2020-08-06 NOTE — Evaluation (Signed)
Speech Language Pathology Evaluation Patient Details Name: Reginald [name] MRN: [id number] DOB: 01/27/1936 Today's Date: 08/06/2020 Time: 2119-4174 SLP Time Calculation (min) (ACUTE ONLY): 30 min  Problem List:  Patient Active Problem List   Diagnosis Date Noted  . Stroke ([place]) 08/05/2020  . Vision loss of right eye 06/09/2020  . Acute on chronic renal failure ([place]) 12/19/2018  . Acute renal failure superimposed on stage 4 chronic kidney disease ([place]) 12/19/2018  . AKI (acute kidney injury) ([place]) 11/26/2018  . HCAP (healthcare-associated pneumonia) 11/15/2018  . COVID-19 11/15/2018  . Chronic venous insufficiency 10/04/2017  . Venous stasis ulcers of both lower extremities ([place]) 10/04/2017  . Nonspecific chest pain 10/27/2016  . Morbid obesity ([place]) 05/02/2016  . Lymphedema 05/02/2016  . Abnormal laboratory test result 10/15/2015  . Cough 10/08/2015  . Hematuria 09/21/2015  . Hypokalemia 08/13/2015  . HTN (hypertension) 08/13/2015  . Multiple open wounds of lower leg 07/22/2015  . CKD (chronic kidney disease) stage 4, GFR 15-29 ml/min (HCC) 07/22/2015  . Cellulitis of leg, right 06/28/2015  . ARF (acute renal failure) ([place]) 06/28/2015  . Hypertensive heart disease with CHF (congestive heart failure) ([place]) 05/19/2015  . CAD (coronary artery disease), native coronary artery 05/19/2015  . Hyperlipidemia 05/19/2015  . Vitamin D deficiency 05/19/2015  . Pressure ulcer 05/16/2015  . Diastolic dysfunction with chronic heart failure ([place]) 05/08/2015  . Blisters of multiple sites 05/08/2015  . Type 2 diabetes mellitus ([place])   . Obesity, unspecified   . OSA (obstructive sleep apnea)   . Atrial fibrillation ([place])   . Secondary DM with CKD stage 4 and hypertension ([place])   . Glaucoma   . Cellulitis 05/07/2015   Past Medical History:  Past Medical History:  Diagnosis Date  . Anteroseptal myocardial infarction ([place])   . Carotid artery occlusion   . CHF (congestive heart failure)  ([place])   . Chronic kidney disease   . COPD (chronic obstructive pulmonary disease) ([place])   . Coronary atherosclerosis of unspecified type of vessel, native or graft    a. s/p prior PCI to LAD in 1997  . Diverticulosis   . ED (erectile dysfunction)   . Foley catheter in place   . Glaucoma   . Hematuria   . History of 2019 novel coronavirus disease (COVID-19) 11/2018  . Hydronephrosis, bilateral   . Hydroureter   . Hypercholesteremia   . Hypertensive renal disease   . Hypogonadism male   . Hypokalemia   . Obesity hypoventilation syndrome ([place])   . Obesity, unspecified   . OSA (obstructive sleep apnea)   . Peripheral vascular disease ([place])   . Permanent atrial fibrillation ([place])   . Phimosis   . Pneumonia   . Rhabdomyolysis   . Tremor   . Type II or unspecified type diabetes mellitus without mention of complication, not stated as uncontrolled   . Unspecified essential hypertension   . Urinary incontinence   . Urinary retention   . Vestibulopathy    Past Surgical History:  Past Surgical History:  Procedure Laterality Date  . APPENDECTOMY  1985  . CARDIAC CATHETERIZATION    . CHOLECYSTECTOMY  06/2000  . CIRCUMCISION N/A 05/28/2020   Procedure: CIRCUMCISION ADULT;  Surgeon: Franchot Gallo, MD;  Location: [facility] ORS;  Service: Urology;  Laterality: N/A;  . CORONARY ANGIOPLASTY  08/14/1995   stent placement to LAD   . DORSAL SLIT N/A 05/28/2020   Procedure: DORSAL SLIT;  Surgeon: Franchot Gallo, MD;  Location: [facility] ORS;  Service: Urology;  Laterality: N/A;  70 MINS  . ENDOVENOUS ABLATION SAPHENOUS VEIN W/ LASER Left 02/21/2018   endovenous laser ablation L GSV by Ruta Hinds MD   . Streeter Right 1985  . KNEE ARTHROSCOPY Left 1991  . LUMBAR FUSION    . NOSE SURGERY  1970s   Per Dr. Terrence Dupont [facility] in pt chart  . TRANSCAROTID ARTERY REVASCULARIZATION Right 06/12/2020   Procedure: RIGHT TRANSCAROTID ARTERY REVASCULARIZATION;  Surgeon: Serafina Mitchell, MD;   Location: [facility] OR;  Service: Vascular;  Laterality: Right;   HPI:  85yo male admitted 08/05/20 with left side weakness, sore throat,occasional cough/SOB. PMH: CVA (12/21), DM2, PAF, OSA, HTN, HLD, COPD, CHF, CVA. MRI = 58mm acute ischemic infarct posterior right internal capsule/corona radiata   Assessment / Plan / Recommendation Clinical Impression  Pt reports being a resident at a nursing facility. The [place] Mental Status (SLUMS) Examination was administered. Pt scored 25/30 (N=27+/30), raising concern for mild neurocognitive deficits. Points were lost on mental math, thought organization (naming category members), delayed recall (recall of 4/5 unrelated words), and digit reversal. No family was present to discuss previous level of function. Recommend consideration of skilled ST services at SNF to maximize cognition.    SLP Assessment  SLP Recommendation/Assessment: All further Speech Language Pathology needs can be addressed in the next venue of care  SLP Visit Diagnosis: Cognitive communication deficit (R41.841)    Follow Up Recommendations  Skilled Nursing facility;Home health SLP       SLP Evaluation Cognition  Overall Cognitive Status: No family/caregiver present to determine baseline cognitive functioning Arousal/Alertness: Awake/alert Orientation Level: Oriented X4 Attention: Focused;Sustained Focused Attention: Appears intact Sustained Attention: Appears intact       Comprehension  Auditory Comprehension Overall Auditory Comprehension: Appears within functional limits for tasks assessed    Expression Expression Primary Mode of Expression: Verbal Verbal Expression Overall Verbal Expression: Appears within functional limits for tasks assessed Written Expression Dominant Hand: Right   Oral / Motor  Oral Motor/Sensory Function Overall Oral Motor/Sensory Function: Within functional limits Motor Speech Overall Motor Speech: Appears within functional limits  for tasks assessed Intelligibility: Intelligible   GO                   Avi Archuleta B. Quentin Ore, [facility], [place] Speech Language Pathologist Office: [phone] Pager: [phone]  Shonna Chock 08/06/2020, 3:05 PM

## 2020-08-07 LAB — COMPREHENSIVE METABOLIC PANEL
ALT: 19 U/L (ref 0–44)
AST: 19 U/L (ref 15–41)
Albumin: 3.1 g/dL — ABNORMAL LOW (ref 3.5–5.0)
Alkaline Phosphatase: 60 U/L (ref 38–126)
Anion gap: 11 (ref 5–15)
BUN: 14 mg/dL (ref 8–23)
CO2: 27 mmol/L (ref 22–32)
Calcium: 9.5 mg/dL (ref 8.9–10.3)
Chloride: 101 mmol/L (ref 98–111)
Creatinine, Ser: 1.38 mg/dL — ABNORMAL HIGH (ref 0.61–1.24)
GFR, Estimated: 51 mL/min — ABNORMAL LOW (ref >60)
Glucose, Bld: 151 mg/dL — ABNORMAL HIGH (ref 70–99)
Potassium: 3.6 mmol/L (ref 3.5–5.1)
Sodium: 139 mmol/L (ref 135–145)
Total Bilirubin: 1.2 mg/dL (ref 0.3–1.2)
Total Protein: 5.7 g/dL — ABNORMAL LOW (ref 6.5–8.1)

## 2020-08-07 LAB — CBC WITH DIFFERENTIAL/PLATELET
Abs Immature Granulocytes: 0.02 10*3/uL (ref 0.00–0.07)
Basophils Absolute: 0 10*3/uL (ref 0.0–0.1)
Basophils Relative: 1 %
Eosinophils Absolute: 0.4 10*3/uL (ref 0.0–0.5)
Eosinophils Relative: 6 %
HCT: 49.9 % (ref 39.0–52.0)
Hemoglobin: 15.9 g/dL (ref 13.0–17.0)
Immature Granulocytes: 0 %
Lymphocytes Relative: 22 %
Lymphs Abs: 1.6 10*3/uL (ref 0.7–4.0)
MCH: 29.2 pg (ref 26.0–34.0)
MCHC: 31.9 g/dL (ref 30.0–36.0)
MCV: 91.6 fL (ref 80.0–100.0)
Monocytes Absolute: 0.8 10*3/uL (ref 0.1–1.0)
Monocytes Relative: 11 %
Neutro Abs: 4.5 10*3/uL (ref 1.7–7.7)
Neutrophils Relative %: 60 %
Platelets: 189 10*3/uL (ref 150–400)
RBC: 5.45 MIL/uL (ref 4.22–5.81)
RDW: 13.7 % (ref 11.5–15.5)
WBC: 7.4 10*3/uL (ref 4.0–10.5)
nRBC: 0 % (ref 0.0–0.2)

## 2020-08-07 LAB — GLUCOSE, CAPILLARY
Glucose-Capillary: 155 mg/dL — ABNORMAL HIGH (ref 70–99)
Glucose-Capillary: 163 mg/dL — ABNORMAL HIGH (ref 70–99)
Glucose-Capillary: 137 mg/dL — ABNORMAL HIGH (ref 70–99)

## 2020-08-07 MED ORDER — METOPROLOL SUCCINATE ER 25 MG PO TB24
12.5000 mg | ORAL_TABLET | Freq: Every day | ORAL | Status: DC
  Administered 2020-08-07: 12.5 mg via ORAL
  Filled 2020-08-07: qty 1

## 2020-08-07 MED ORDER — GABAPENTIN 100 MG PO CAPS: 200.0000 mg | ORAL_CAPSULE | Freq: Every evening | ORAL | Status: DC | PRN

## 2020-08-07 MED ORDER — APIXABAN 5 MG PO TABS: 5.0000 mg | ORAL_TABLET | Freq: Two times a day (BID) | ORAL | 0 refills | Status: DC

## 2020-08-07 NOTE — Discharge Summary (Signed)
[facility] Discharge Summary  Patient name: Reginald [name] Medical record number: [id number] Date of birth: [birth year] Age: 85 y.o. Gender: male Date of Admission: 08/05/2020  Date of Discharge: 08/07/20 Admitting Physician: Zenia Resides, MD  Primary Care Provider: [name], MD Consultants: Neurology  Indication for Hospitalization: Stroke  Discharge Diagnoses/Problem List:  AD, CHF, A-Fib, morbid obesity, CKD, COPD, CVA, OSA, hypertension, hypercholesterolemia, diabetes.  Disposition: Able to be discharged back to SNF  Discharge Condition: Stable  Discharge Exam:   Physical Exam Constitutional:      General: He is not in acute distress.    Appearance: Normal appearance. He is not ill-appearing.  HENT:     Mouth/Throat:     Mouth: Mucous membranes are moist.  Cardiovascular:     Rate and Rhythm: Normal rate. Rhythm irregular.  Abdominal:     General: Abdomen is flat.     Palpations: Abdomen is soft.  Skin:    General: Skin is warm.  Neurological:     Mental Status: He is alert and oriented to person, place, and time.     Comments: Right side strength normal.  Left arm strength improved from yesterday but still weak at 3/5 and limitation in flexion.  Left leg continues to be very weak with small amount of flexion and no movement in foot.  Sensation intact throughout  Brief Hospital Course:  Reginald [name] is a 85 y.o. male presenting with left sided weakness and concern for stroke . PMH is significant for CAD, CHF, A-Fib, morbid obesity, CKD, COPD, CVA, OSA, hypertension, hypercholesterolemia, diabetes.  CVA Patient had previous stroke in November 2021.  Admitted to hospital after waking up with left sided weakness.  Found to be alert and Oriented x3 and understands why here in hospital.  On Neuro exam right sided UE & LE 5/5.  Left UE- 4/5.  Left LE- 3/5  Left Foot- 0/5.  Sensation intact throughout.  Slight droop on right side  of mouth.  CN 2-12 otherwise intact outside of bliness in right eye from previous stroke.  Patient was on Aspirin 81 mg  Atorvastatin 40 mg daily and Eliquis 2.5mg  BID.  CBC, electrolytes, PT, and aPTT normal. No acute abnormality no change from the prior CT 06/09/2020 and moderate atrophy and chronic microvascular ischemic change in the white matter.  Neuro consulted for stroke work-up.  Held Metoprolol for permissive hypertension for 24 hours.  Patient passed SLP.  MRI/MRA show 11 mm acute ischemic nonhemorrhagic infarct involving the posterior right internal capsule/corona radiata.  Negative intracranial MRA for large vessel occlusion.  Moderate atherosclerotic change with associated moderate stenosis at the para clinoid left and proximal left M1 segment. Additional short-segment severe stenosis at the distal right P2/P3 junction.  Patient also underwent PT/OT, regained moderate amount of strength in left arm with significant residual weakness and left leg weakness remained unchanged.  Eliquis was increased to 5mg  BID.  Able to be discharged back to Nursing Home for continued PT/OT.   COVID-19 Patient tested positive for COVID on admission.  O2 sats remained >95% on RA.  Patient Vaccinated x3.  Remained symptom free throughout hospitalization.  Did not receive steroids as no oxygen requirement.  Did not give remdesevir as it has no studied clinical benefit in patient's without COVID symptoms.    HFpEF Patient had repeat ECHO that showed EF of 55%.  Otherwise normal other than mild LV Hypertrophy.  Patient's Meotprolol restarted prior to discharge.  A-Fib  Patient's EKG did show A-Fib and had irregular heartbeat throughout stay.  Remained rate controlled even when Metoprolol was held.    Other problems Chronic and Stable    Issues for Follow Up:  1. Continue PT/OT following stroke. 2. Continue to monitor BMP for changes in Creatinine, may need to change dose of Eliquis if kidney function  worsens. 3. Consider Monoclonal Antibody for COVID-19 as patient strong candidate for with multiple co-morbidities and recent diagnosis of on 1/26.  Continue to monitor patient for COVID symptoms as well.   Significant Procedures: TTE  Significant Labs and Imaging:  Recent Labs  Lab 08/05/20 1338 08/05/20 1342 08/06/20 0433 08/07/20 0642  WBC 9.1  --  7.1 7.4  HGB 16.7 18.0* 15.8 15.9  HCT 53.5* 53.0* 49.4 49.9  PLT 173  --  183 189   Recent Labs  Lab 08/05/20 1338 08/05/20 1342 08/06/20 0433 08/07/20 0642  NA 140 141 141 139  K 3.9 3.9 3.4* 3.6  CL 101 101 103 101  CO2 30  --  27 27  GLUCOSE 184* 174* 149* 151*  BUN 14 16 12 14   CREATININE 1.47* 1.30* 1.37* 1.38*  CALCIUM 9.5  --  9.3 9.5  ALKPHOS 67  --  55 60  AST 27  --  18 19  ALT 27  --  22 19  ALBUMIN 3.4*  --  3.1* 3.1*     Results/Tests Pending at Time of Discharge: None  Discharge Medications:  Allergies as of 08/07/2020      Reactions   Adhesive [tape] Rash      Medication List    STOP taking these medications   docusate sodium 100 MG capsule Commonly known as: COLACE     TAKE these medications   acetaminophen 325 MG tablet Commonly known as: TYLENOL Take 650 mg by mouth every 4 (four) hours as needed for moderate pain.   albuterol 108 (90 Base) MCG/ACT inhaler Commonly known as: VENTOLIN HFA Inhale 2 puffs into the lungs 2 (two) times daily as needed for wheezing or shortness of breath.   apixaban 5 MG Tabs tablet Commonly known as: ELIQUIS Take 1 tablet (5 mg total) by mouth 2 (two) times daily. What changed:   medication strength  how much to take   aspirin 81 MG EC tablet Take 1 tablet (81 mg total) by mouth daily. Swallow whole.   atorvastatin 20 MG tablet Commonly known as: LIPITOR Take 40 mg by mouth at bedtime.   Biofreeze 4 % Gel Generic drug: Menthol (Topical Analgesic) Apply 1 application topically 2 (two) times daily as needed (forehead and left arm pain).    cetirizine 10 MG tablet Commonly known as: ZYRTEC Take 5 mg by mouth at bedtime.   cholecalciferol 25 MCG (1000 UNIT) tablet Commonly known as: VITAMIN D Take 2,000 Units by mouth daily.   DEXTRAN 70-HYPROMELLOSE (PF) OP Apply 1 drop to eye in the morning, at noon, in the evening, and at bedtime.   eucerin cream Apply 1 application topically 2 (two) times a day. For itching   gabapentin 100 MG capsule Commonly known as: NEURONTIN Take 2 capsules (200 mg total) by mouth at bedtime as needed. What changed:   when to take this  reasons to take this   insulin lispro 100 UNIT/ML injection Commonly known as: HUMALOG Inject 0-14 Units into the skin See admin instructions. Inject 0-14 units subcutaneously twice daily per sliding scale: CBG 0-200 0 units, 201-250 2 units, 251-300 4  units, 301-350 6 units, 351-400 8 units, 401-450 10 units, 451-500 12 units, >500 14 units If blood sugar is > 400 or < 80 call pec triage   latanoprost 0.005 % ophthalmic solution Commonly known as: XALATAN Place 1 drop into both eyes at bedtime.   magnesium oxide 400 MG tablet Commonly known as: MAG-OX Take 400 mg by mouth daily.   melatonin 5 MG Tabs Take 5 mg by mouth at bedtime.   metoprolol succinate 25 MG 24 hr tablet Commonly known as: Toprol XL Take 0.5 tablets (12.5 mg total) by mouth daily.   nitroGLYCERIN 0.4 MG SL tablet Commonly known as: NITROSTAT Place 0.4 mg under the tongue every 5 (five) minutes as needed for chest pain.   oxybutynin 5 MG tablet Commonly known as: DITROPAN Take 5 mg by mouth every 8 (eight) hours as needed for bladder spasms.   polyethylene glycol 17 g packet Commonly known as: MIRALAX / GLYCOLAX Take 17 g by mouth daily. Mix in 6 oz fluid and drink   repaglinide 0.5 MG tablet Commonly known as: PRANDIN Take 0.5 mg by mouth daily.   sodium chloride 0.65 % Soln nasal spray Commonly known as: OCEAN Place 1 spray into both nostrils as directed. 1 Spray  TID & 1 Spray Every 2 Hours PRN For Allergies/Congestion   tamsulosin 0.4 MG Caps capsule Commonly known as: FLOMAX Take 1 capsule (0.4 mg total) by mouth daily after supper.   Trulicity 3 MI/6.8EH Sopn Generic drug: Dulaglutide Inject 3 mg into the skin once a week. Mondays   vitamin B-12 1000 MCG tablet Commonly known as: CYANOCOBALAMIN Take 1,000 mcg by mouth daily.       Discharge Instructions: Please refer to Patient Instructions section of EMR for full details.  Patient was counseled important signs and symptoms that should prompt return to medical care, changes in medications, dietary instructions, activity restrictions, and follow up appointments.   Follow-Up Appointments:  Contact information for follow-up providers    Frann Rider, NP. Schedule an appointment as soon as possible for a visit in 4 week(s).   Specialty: Neurology Contact information: [address] [place] [address] [phone]            Contact information for after-discharge care    Destination    [facility] Preferred SNF .   Service: Skilled Nursing Contact information: [place] [phone]                  Delora Fuel, MD 08/07/2020, 12:44 PM PGY-1, [place]

## 2020-08-07 NOTE — Progress Notes (Signed)
[name]  D/C'd to Skilled nursing facility per MD order.  Discussed with the patient and all questions fully answered. Report called to Camdem, and given to nurse Anteria. She verbalized understanding of all information given to her, and all questions answered.  VSS, Skin clean, dry and intact without evidence of skin break down, no evidence of skin tears noted. IV catheter discontinued intact. Site without signs and symptoms of complications. Dressing and pressure applied.  An After Visit Summary was printed and given to PTAR to give receiving nurse at [facility] place.   Patient escorted via stretcher , and D/C to SNF  via ambulance.Dorris Carnes 08/07/2020 4:27 PM

## 2020-08-07 NOTE — TOC Transition Note (Signed)
Transition of Care [facility]) - CM/SW Discharge Note   Patient Details  Name: Reginald [name] MRN: [id number] Date of Birth: 1936-05-01  Transition of Care [facility] CM/SW Contact:  Geralynn Ochs, LCSW Phone Number: 08/07/2020, 2:07 PM   Clinical Narrative:   Nurse to call report to [phone], Room 805A.    Final next level of care: [place] Barriers to Discharge: Barriers Resolved   Patient Goals and CMS Choice Patient states their goals for this hospitalization and ongoing recovery are:: to get back to [facility].gov Compare Post Acute Care list provided to:: Patient Choice offered to / list presented to : Patient  Discharge Placement              Patient chooses bed at: [facility] Patient to be transferred to facility by: PTAR   Patient and family notified of of transfer: 08/07/20  Discharge Plan and Services     Post Acute Care Choice: [place]                               Social Determinants of Health (SDOH) Interventions     Readmission Risk Interventions No flowsheet data found.

## 2020-08-07 NOTE — Hospital Course (Signed)
Reginald [name] is a 85 y.o. male presenting with left sided weakness and concern for stroke . PMH is significant for CAD, CHF, A-Fib, morbid obesity, CKD, COPD, CVA, OSA, hypertension, hypercholesterolemia, diabetes.  CVA Patient had previous stroke in November 2021.  Admitted to hospital after waking up with left sided weakness.  Found to be alert and Oriented x3 and understands why here in hospital.  On Neuro exam right sided UE & LE 5/5.  Left UE- 4/5.  Left LE- 3/5  Left Foot- 0/5.  Sensation intact throughout.  Slight droop on right side of mouth.  CN 2-12 otherwise intact outside of bliness in right eye from previous stroke.  Patient was on Aspirin 81 mg  Atorvastatin 40 mg daily and Eliquis 2.5mg  BID.  CBC, electrolytes, PT, and aPTT normal. No acute abnormality no change from the prior CT 06/09/2020 and moderate atrophy and chronic microvascular ischemic change in the white matter.  Neuro consulted for stroke work-up.  Held Metoprolol for permissive hypertension for 24 hours.  Patient passed SLP.  MRI/MRA show 11 mm acute ischemic nonhemorrhagic infarct involving the posterior right internal capsule/corona radiata.  Negative intracranial MRA for large vessel occlusion.  Moderate atherosclerotic change with associated moderate stenosis at the para clinoid left and proximal left M1 segment. Additional short-segment severe stenosis at the distal right P2/P3 junction.  Patient also underwent PT/OT, regained moderate amount of strength in left arm with significant residual weakness and left leg weakness remained unchanged.  Eliquis was increased to 5mg  BID.  Able to be discharged back to Nursing Home for continued PT/OT.   COVID-19 Patient tested positive for COVID on admission.  O2 sats remained >95% on RA.  Patient Vaccinated x3.  Remained symptom free throughout hospitalization.  Did not receive steroids as no oxygen requirement.  Did not give remdesevir as it has no studied clinical benefit in  patient's without COVID symptoms.    HFpEF Patient had repeat ECHO that showed EF of 55%.  Otherwise normal other than mild LV Hypertrophy.  Patient's Meotprolol restarted prior to discharge.  A-Fib Patient's EKG did show A-Fib and had irregular heartbeat throughout stay.  Remained rate controlled even when Metoprolol was held.    Other problems Chronic and Stable

## 2020-08-07 NOTE — Plan of Care (Signed)
  Problem: Education: Goal: Knowledge of General Education information will improve Description: Including pain rating scale, medication(s)/side effects and non-pharmacologic comfort measures Outcome: Adequate for Discharge   Problem: Health Behavior/Discharge Planning: Goal: Ability to manage health-related needs will improve Outcome: Adequate for Discharge   Problem: Clinical Measurements: Goal: Ability to maintain clinical measurements within normal limits will improve Outcome: Adequate for Discharge Goal: Will remain free from infection Outcome: Adequate for Discharge Goal: Diagnostic test results will improve Outcome: Adequate for Discharge Goal: Respiratory complications will improve Outcome: Adequate for Discharge Goal: Cardiovascular complication will be avoided Outcome: Adequate for Discharge   Problem: Activity: Goal: Risk for activity intolerance will decrease Outcome: Adequate for Discharge   Problem: Nutrition: Goal: Adequate nutrition will be maintained Outcome: Adequate for Discharge   Problem: Coping: Goal: Level of anxiety will decrease Outcome: Adequate for Discharge   Problem: Elimination: Goal: Will not experience complications related to bowel motility Outcome: Adequate for Discharge Goal: Will not experience complications related to urinary retention Outcome: Adequate for Discharge   Problem: Pain Managment: Goal: General experience of comfort will improve Outcome: Adequate for Discharge   Problem: Skin Integrity: Goal: Risk for impaired skin integrity will decrease Outcome: Adequate for Discharge   Problem: Education: Goal: Knowledge of disease or condition will improve Outcome: Adequate for Discharge Goal: Knowledge of secondary prevention will improve Outcome: Adequate for Discharge Goal: Knowledge of patient specific risk factors addressed and post discharge goals established will improve Outcome: Adequate for Discharge   Problem:  Ischemic Stroke/TIA Tissue Perfusion: Goal: Complications of ischemic stroke/TIA will be minimized Outcome: Adequate for Discharge   Problem: Spontaneous Subarachnoid Hemorrhage Tissue Perfusion: Goal: Complications of Spontaneous Subarachnoid Hemorrhage will be minimized Outcome: Adequate for Discharge

## 2020-08-09 ENCOUNTER — Emergency Department (HOSPITAL_COMMUNITY): Payer: Medicare Other

## 2020-08-09 ENCOUNTER — Encounter (HOSPITAL_COMMUNITY): Payer: Self-pay | Admitting: Emergency Medicine

## 2020-08-09 ENCOUNTER — Other Ambulatory Visit: Payer: Self-pay

## 2020-08-09 ENCOUNTER — Emergency Department (HOSPITAL_COMMUNITY)
Admission: EM | Admit: 2020-08-09 | Discharge: 2020-08-10 | Disposition: A | Discharge status: Home / Self Care | Payer: Medicare Other | Attending: Emergency Medicine | Admitting: Emergency Medicine

## 2020-08-09 DIAGNOSIS — Z7982 Long term (current) use of aspirin: Secondary | ICD-10-CM | POA: Diagnosis not present

## 2020-08-09 DIAGNOSIS — Z7901 Long term (current) use of anticoagulants: Secondary | ICD-10-CM | POA: Insufficient documentation

## 2020-08-09 DIAGNOSIS — R410 Disorientation, unspecified: Secondary | ICD-10-CM | POA: Diagnosis not present

## 2020-08-09 DIAGNOSIS — N184 Chronic kidney disease, stage 4 (severe): Secondary | ICD-10-CM | POA: Diagnosis not present

## 2020-08-09 DIAGNOSIS — I130 Hypertensive heart and chronic kidney disease with heart failure and stage 1 through stage 4 chronic kidney disease, or unspecified chronic kidney disease: Secondary | ICD-10-CM | POA: Diagnosis not present

## 2020-08-09 DIAGNOSIS — Z8616 Personal history of COVID-19: Secondary | ICD-10-CM | POA: Insufficient documentation

## 2020-08-09 DIAGNOSIS — J449 Chronic obstructive pulmonary disease, unspecified: Secondary | ICD-10-CM | POA: Diagnosis not present

## 2020-08-09 DIAGNOSIS — Z79899 Other long term (current) drug therapy: Secondary | ICD-10-CM | POA: Diagnosis not present

## 2020-08-09 DIAGNOSIS — H5461 Unqualified visual loss, right eye, normal vision left eye: Secondary | ICD-10-CM

## 2020-08-09 DIAGNOSIS — E860 Dehydration: Secondary | ICD-10-CM | POA: Insufficient documentation

## 2020-08-09 DIAGNOSIS — I10 Essential (primary) hypertension: Secondary | ICD-10-CM | POA: Diagnosis not present

## 2020-08-09 DIAGNOSIS — M7989 Other specified soft tissue disorders: Secondary | ICD-10-CM | POA: Diagnosis not present

## 2020-08-09 DIAGNOSIS — R29810 Facial weakness: Secondary | ICD-10-CM | POA: Diagnosis not present

## 2020-08-09 DIAGNOSIS — R531 Weakness: Secondary | ICD-10-CM | POA: Diagnosis present

## 2020-08-09 DIAGNOSIS — M25522 Pain in left elbow: Secondary | ICD-10-CM | POA: Diagnosis not present

## 2020-08-09 DIAGNOSIS — I2510 Atherosclerotic heart disease of native coronary artery without angina pectoris: Secondary | ICD-10-CM | POA: Diagnosis not present

## 2020-08-09 DIAGNOSIS — E1122 Type 2 diabetes mellitus with diabetic chronic kidney disease: Secondary | ICD-10-CM | POA: Insufficient documentation

## 2020-08-09 DIAGNOSIS — I509 Heart failure, unspecified: Secondary | ICD-10-CM | POA: Diagnosis not present

## 2020-08-09 DIAGNOSIS — H53131 Sudden visual loss, right eye: Secondary | ICD-10-CM | POA: Insufficient documentation

## 2020-08-09 DIAGNOSIS — U071 COVID-19: Secondary | ICD-10-CM | POA: Diagnosis not present

## 2020-08-09 DIAGNOSIS — Z794 Long term (current) use of insulin: Secondary | ICD-10-CM | POA: Diagnosis not present

## 2020-08-09 LAB — DIFFERENTIAL
Abs Immature Granulocytes: 0.03 10*3/uL (ref 0.00–0.07)
Basophils Absolute: 0 10*3/uL (ref 0.0–0.1)
Basophils Relative: 0 %
Eosinophils Absolute: 0.1 10*3/uL (ref 0.0–0.5)
Eosinophils Relative: 1 %
Immature Granulocytes: 0 %
Lymphocytes Relative: 14 %
Lymphs Abs: 1.4 10*3/uL (ref 0.7–4.0)
Monocytes Absolute: 1.4 10*3/uL — ABNORMAL HIGH (ref 0.1–1.0)
Monocytes Relative: 14 %
Neutro Abs: 6.8 10*3/uL (ref 1.7–7.7)
Neutrophils Relative %: 71 %

## 2020-08-09 LAB — I-STAT CHEM 8, ED
BUN: 24 mg/dL — ABNORMAL HIGH (ref 8–23)
Calcium, Ion: 1.2 mmol/L (ref 1.15–1.40)
Chloride: 100 mmol/L (ref 98–111)
Creatinine, Ser: 1.5 mg/dL — ABNORMAL HIGH (ref 0.61–1.24)
Glucose, Bld: 186 mg/dL — ABNORMAL HIGH (ref 70–99)
HCT: 53 % — ABNORMAL HIGH (ref 39.0–52.0)
Hemoglobin: 18 g/dL — ABNORMAL HIGH (ref 13.0–17.0)
Potassium: 4.1 mmol/L (ref 3.5–5.1)
Sodium: 140 mmol/L (ref 135–145)
TCO2: 29 mmol/L (ref 22–32)

## 2020-08-09 LAB — CBC
HCT: 54.5 % — ABNORMAL HIGH (ref 39.0–52.0)
Hemoglobin: 17.2 g/dL — ABNORMAL HIGH (ref 13.0–17.0)
MCH: 29.3 pg (ref 26.0–34.0)
MCHC: 31.6 g/dL (ref 30.0–36.0)
MCV: 92.7 fL (ref 80.0–100.0)
Platelets: 199 10*3/uL (ref 150–400)
RBC: 5.88 MIL/uL — ABNORMAL HIGH (ref 4.22–5.81)
RDW: 13.9 % (ref 11.5–15.5)
WBC: 9.7 10*3/uL (ref 4.0–10.5)
nRBC: 0 % (ref 0.0–0.2)

## 2020-08-09 LAB — COMPREHENSIVE METABOLIC PANEL
ALT: 21 U/L (ref 0–44)
AST: 19 U/L (ref 15–41)
Albumin: 3.4 g/dL — ABNORMAL LOW (ref 3.5–5.0)
Alkaline Phosphatase: 64 U/L (ref 38–126)
Anion gap: 11 (ref 5–15)
BUN: 19 mg/dL (ref 8–23)
CO2: 29 mmol/L (ref 22–32)
Calcium: 9.7 mg/dL (ref 8.9–10.3)
Chloride: 100 mmol/L (ref 98–111)
Creatinine, Ser: 1.68 mg/dL — ABNORMAL HIGH (ref 0.61–1.24)
GFR, Estimated: 40 mL/min — ABNORMAL LOW (ref >60)
Glucose, Bld: 192 mg/dL — ABNORMAL HIGH (ref 70–99)
Potassium: 4 mmol/L (ref 3.5–5.1)
Sodium: 140 mmol/L (ref 135–145)
Total Bilirubin: 1.5 mg/dL — ABNORMAL HIGH (ref 0.3–1.2)
Total Protein: 6.6 g/dL (ref 6.5–8.1)

## 2020-08-09 LAB — CBG MONITORING, ED: Glucose-Capillary: 176 mg/dL — ABNORMAL HIGH (ref 70–99)

## 2020-08-09 LAB — PROTIME-INR
INR: 1.6 — ABNORMAL HIGH (ref 0.8–1.2)
Prothrombin Time: 18 seconds — ABNORMAL HIGH (ref 11.4–15.2)

## 2020-08-09 LAB — APTT: aPTT: 41 seconds — ABNORMAL HIGH (ref 24–36)

## 2020-08-09 MED ORDER — LACTATED RINGERS IV BOLUS
1000.0000 mL | Freq: Once | INTRAVENOUS | Status: AC
  Administered 2020-08-09: 1000 mL via INTRAVENOUS

## 2020-08-09 MED ORDER — SODIUM CHLORIDE 0.9% FLUSH: 3.0000 mL | Freq: Once | INTRAVENOUS | Status: DC

## 2020-08-09 NOTE — ED Triage Notes (Addendum)
Pt to triage via [facility] from [facility].  Staff reports pt last seen "normal" at 11am today.  Pt is alert and oriented.  L sided facidal droop and L arm weakness from CVA on 1/26..  CBG 273.  20g R FA.  Pt on 2 liters home O2 and takes Eliquis.  Staff unable to state what was different today from pt's previous deficits.  Pt reports increased L sided weakness.   COVID + 1/26.

## 2020-08-09 NOTE — ED Provider Notes (Signed)
  Provider Note MRN:  [id number]  Arrival date & time: 08/10/20    ED Course and Medical Decision Making  Assumed care from Dr. Vanita Panda at shift change.  ?AMS here from facility, recent admission for stroke.  Awaiting UA.  Workup overall reassuring, candidate for discharge possibly with abx.  Urinalysis is negative.  On my evaluation patient is oriented to self, time, can tell me who the president is, capable of complex thought, overall seems to be doing well and is at his new baseline given the recent stroke.  On room air on my evaluation with saturations of 92 to 94%.  No indication for further testing or admission, appropriate for discharge.  Procedures  Final Clinical Impressions(s) / ED Diagnoses     ICD-10-CM   1. Dehydration  E86.0   2. COVID-19  U07.1   3. Vision loss of right eye  H54.61     ED Discharge Orders    None        Discharge Instructions      Please continue medications as previously prescribed.  Please motivate patient to increase oral intake to avoid dehydration.  Please return to the emergency department if patient develops new weakness from his known left arm and left leg weakness, has increasing confusion, appears to be in respiratory distress, or for any other emergent concern.     Barth Kirks. Sedonia Small, [place] [email or web address]    Maudie Flakes, MD 08/10/20 [phone]

## 2020-08-09 NOTE — Discharge Instructions (Signed)
°  Please continue medications as previously prescribed.  Please motivate patient to increase oral intake to avoid dehydration.  Please return to the emergency department if patient develops new weakness from his known left arm and left leg weakness, has increasing confusion, appears to be in respiratory distress, or for any other emergent concern.

## 2020-08-09 NOTE — ED Provider Notes (Signed)
[facility] EMERGENCY DEPARTMENT Provider Note   CSN: [id number] Arrival date & time: 08/09/20  1818     History Chief Complaint  Patient presents with  . Weakness    Reginald [name] is a 85 y.o. male.  The history is provided by the patient.  Arm Injury Location:  Elbow Elbow location:  L elbow Injury: no   Pain details:    Quality:  Aching   Radiates to:  Does not radiate   Severity:  Moderate   Onset quality:  Gradual   Timing:  Constant   Progression:  Waxing and waning Dislocation: no   Prior injury to area:  No Relieved by:  Being still Worsened by:  Movement Ineffective treatments:  None tried Associated symptoms: no back pain, no fever, no numbness and no tingling   Risk factors: no concern for non-accidental trauma   Risk factors comment:  Decreased strength from previous troke Altered Mental Status Presenting symptoms: confusion   Severity:  Mild Most recent episode:  Today Episode history:  Single Progression:  Waxing and waning Chronicity:  New Associated symptoms: no abdominal pain, no fever, no palpitations, no rash, no seizures and no vomiting        Past Medical History:  Diagnosis Date  . Anteroseptal myocardial infarction ([place])   . Carotid artery occlusion   . CHF (congestive heart failure) ([place])   . Chronic kidney disease   . COPD (chronic obstructive pulmonary disease) ([place])   . Coronary atherosclerosis of unspecified type of vessel, native or graft    a. s/p prior PCI to LAD in 1997  . Diverticulosis   . ED (erectile dysfunction)   . Foley catheter in place   . Glaucoma   . Hematuria   . History of 2019 novel coronavirus disease (COVID-19) 11/2018  . Hydronephrosis, bilateral   . Hydroureter   . Hypercholesteremia   . Hypertensive renal disease   . Hypogonadism male   . Hypokalemia   . Obesity hypoventilation syndrome ([place])   . Obesity, unspecified   . OSA (obstructive sleep apnea)   . Peripheral vascular  disease ([place])   . Permanent atrial fibrillation ([place])   . Phimosis   . Pneumonia   . Rhabdomyolysis   . Tremor   . Type II or unspecified type diabetes mellitus without mention of complication, not stated as uncontrolled   . Unspecified essential hypertension   . Urinary incontinence   . Urinary retention   . Vestibulopathy     Patient Active Problem List   Diagnosis Date Noted  . Stroke ([place]) 08/05/2020  . Vision loss of right eye 06/09/2020  . Acute on chronic renal failure ([place]) 12/19/2018  . Acute renal failure superimposed on stage 4 chronic kidney disease ([place]) 12/19/2018  . AKI (acute kidney injury) ([place]) 11/26/2018  . HCAP (healthcare-associated pneumonia) 11/15/2018  . COVID-19 11/15/2018  . Chronic venous insufficiency 10/04/2017  . Venous stasis ulcers of both lower extremities ([place]) 10/04/2017  . Nonspecific chest pain 10/27/2016  . Morbid obesity ([place]) 05/02/2016  . Lymphedema 05/02/2016  . Abnormal laboratory test result 10/15/2015  . Cough 10/08/2015  . Hematuria 09/21/2015  . Hypokalemia 08/13/2015  . HTN (hypertension) 08/13/2015  . Multiple open wounds of lower leg 07/22/2015  . CKD (chronic kidney disease) stage 4, GFR 15-29 ml/min (HCC) 07/22/2015  . Cellulitis of leg, right 06/28/2015  . ARF (acute renal failure) ([place]) 06/28/2015  . Hypertensive heart disease with CHF (congestive heart failure) ([place])  05/19/2015  . CAD (coronary artery disease), native coronary artery 05/19/2015  . Hyperlipidemia 05/19/2015  . Vitamin D deficiency 05/19/2015  . Pressure ulcer 05/16/2015  . Diastolic dysfunction with chronic heart failure ([place]) 05/08/2015  . Blisters of multiple sites 05/08/2015  . Type 2 diabetes mellitus ([place])   . Obesity, unspecified   . OSA (obstructive sleep apnea)   . Atrial fibrillation ([place])   . Secondary DM with CKD stage 4 and hypertension ([place])   . Glaucoma   . Cellulitis 05/07/2015    Past Surgical History:  Procedure Laterality Date   . APPENDECTOMY  1985  . CARDIAC CATHETERIZATION    . CHOLECYSTECTOMY  06/2000  . CIRCUMCISION N/A 05/28/2020   Procedure: CIRCUMCISION ADULT;  Surgeon: Franchot Gallo, MD;  Location: [facility]S;  Service: Urology;  Laterality: N/A;  . CORONARY ANGIOPLASTY  08/14/1995   stent placement to LAD   . DORSAL SLIT N/A 05/28/2020   Procedure: DORSAL SLIT;  Surgeon: Franchot Gallo, MD;  Location: [facility];  Service: Urology;  Laterality: N/A;  67 MINS  . ENDOVENOUS ABLATION SAPHENOUS VEIN W/ LASER Left 02/21/2018   endovenous laser ablation L GSV by Ruta Hinds MD   . [place] Right 1985  . KNEE ARTHROSCOPY Left 1991  . LUMBAR FUSION    . NOSE SURGERY  1970s   Per Dr. Terrence Dupont [facility] in pt chart  . TRANSCAROTID ARTERY REVASCULARIZATION Right 06/12/2020   Procedure: RIGHT TRANSCAROTID ARTERY REVASCULARIZATION;  Surgeon: Serafina , MD;  Location: [facility] OR;  Service: Vascular;  Laterality: Right;       Family History  Problem Relation Age of Onset  . COPD Mother   . Heart disease Mother   . Diabetes Father   . Heart disease Father   . Diabetes Sister   . Heart disease Brother   . Heart disease Brother   . Breast cancer Maternal Aunt   . Diabetes Paternal Grandmother   . Colon cancer Maternal Aunt   . Ovarian cancer Daughter   . Glaucoma Other     Social History   Tobacco Use  . Smoking status: Never Smoker  . Smokeless tobacco: Never Used  Vaping Use  . Vaping Use: Never used  Substance Use Topics  . Alcohol use: No    Alcohol/week: 0.0 standard drinks  . Drug use: No    Home Medications Prior to Admission medications   Medication Sig Start Date End Date Taking? Authorizing Provider  acetaminophen (TYLENOL) 325 MG tablet Take 650 mg by mouth every 4 (four) hours as needed for moderate pain.     [provider]  albuterol (VENTOLIN HFA) 108 (90 Base) MCG/ACT inhaler Inhale 2 puffs into the lungs 2 (two) times daily as needed for wheezing or  shortness of breath.    [provider]  apixaban (ELIQUIS) 5 MG TABS tablet Take 1 tablet (5 mg total) by mouth 2 (two) times daily. 08/07/20   Doristine Mango L, DO  aspirin EC 81 MG EC tablet Take 1 tablet (81 mg total) by mouth daily. Swallow whole. 06/13/20   Hosie Poisson, MD  atorvastatin (LIPITOR) 20 MG tablet Take 40 mg by mouth at bedtime.    [provider]  cetirizine (ZYRTEC) 10 MG tablet Take 5 mg by mouth at bedtime.     [provider]  cholecalciferol (VITAMIN D) 25 MCG (1000 UNIT) tablet Take 2,000 Units by mouth daily.    [provider]  DEXTRAN 70-HYPROMELLOSE, PF, OP  Apply 1 drop to eye in the morning, at noon, in the evening, and at bedtime.    [provider]  Dulaglutide (TRULICITY) 3 NK/5.3ZJ SOPN Inject 3 mg into the skin once a week. Mondays    [provider]  gabapentin (NEURONTIN) 100 MG capsule Take 2 capsules (200 mg total) by mouth at bedtime as needed. 08/07/20   Anderson, Chelsey L, DO  insulin lispro (HUMALOG) 100 UNIT/ML injection Inject 0-14 Units into the skin See admin instructions. Inject 0-14 units subcutaneously twice daily per sliding scale: CBG 0-200 0 units, 201-250 2 units, 251-300 4 units, 301-350 6 units, 351-400 8 units, 401-450 10 units, 451-500 12 units, >500 14 units If blood sugar is > 400 or < 80 call pec triage    [provider]  latanoprost (XALATAN) 0.005 % ophthalmic solution Place 1 drop into both eyes at bedtime. 01/03/17   [provider]  magnesium oxide (MAG-OX) 400 MG tablet Take 400 mg by mouth daily.    [provider]  Melatonin 5 MG TABS Take 5 mg by mouth at bedtime.    [provider]  Menthol, Topical Analgesic, (BIOFREEZE) 4 % GEL Apply 1 application topically 2 (two) times daily as needed (forehead and left arm pain).    [provider]  metoprolol succinate (TOPROL XL) 25 MG 24 hr tablet Take 0.5 tablets (12.5 mg total) by mouth  daily. 06/14/20 06/14/21  Hosie Poisson, MD  nitroGLYCERIN (NITROSTAT) 0.4 MG SL tablet Place 0.4 mg under the tongue every 5 (five) minutes as needed for chest pain.    [provider]  oxybutynin (DITROPAN) 5 MG tablet Take 5 mg by mouth every 8 (eight) hours as needed for bladder spasms.  05/07/20   [provider]  polyethylene glycol (MIRALAX / GLYCOLAX) 17 g packet Take 17 g by mouth daily. Mix in 6 oz fluid and drink    [provider]  repaglinide (PRANDIN) 0.5 MG tablet Take 0.5 mg by mouth daily. 08/03/20   [provider]  Skin Protectants, Misc. (EUCERIN) cream Apply 1 application topically 2 (two) times a day. For itching    [provider]  sodium chloride (OCEAN) 0.65 % SOLN nasal spray Place 1 spray into both nostrils as directed. 1 Spray TID & 1 Spray Every 2 Hours PRN For Allergies/Congestion    [provider]  tamsulosin (FLOMAX) 0.4 MG CAPS capsule Take 1 capsule (0.4 mg total) by mouth daily after supper. 12/25/18   Kayleen Memos, DO  vitamin B-12 (CYANOCOBALAMIN) 1000 MCG tablet Take 1,000 mcg by mouth daily.    [provider]    Allergies    Adhesive [tape]  Review of Systems   Review of Systems  Constitutional: Negative for chills and fever.  HENT: Negative for ear pain and sore throat.   Eyes: Negative for pain and visual disturbance.  Respiratory: Negative for cough and shortness of breath.   Cardiovascular: Negative for chest pain and palpitations.  Gastrointestinal: Negative for abdominal pain and vomiting.  Genitourinary: Negative for dysuria and hematuria.  Musculoskeletal: Negative for arthralgias and back pain.  Skin: Negative for color change and rash.  Neurological: Negative for seizures and syncope.  Psychiatric/Behavioral: Positive for confusion.  All other systems reviewed and are negative.   Physical Exam Updated Vital Signs BP (!) 161/72   Pulse 78   Temp 99.7 F (37.6 C) (Oral)    Resp (!) 25   SpO2 92%   Physical  Exam Vitals and nursing note reviewed.  Constitutional:      Appearance: He is well-developed and well-nourished. He is obese.  HENT:     Head: Normocephalic and atraumatic.  Eyes:     General: Visual field deficit (Decreased R eye vision) present.     Conjunctiva/sclera: Conjunctivae normal.  Cardiovascular:     Rate and Rhythm: Normal rate and regular rhythm.     Heart sounds: No murmur heard.   Pulmonary:     Effort: Pulmonary effort is normal. No respiratory distress.     Breath sounds: Normal breath sounds.  Abdominal:     Palpations: Abdomen is soft.     Tenderness: There is no abdominal tenderness.  Musculoskeletal:        General: No edema.     Cervical back: Neck supple.  Skin:    General: Skin is warm and dry.  Neurological:     Mental Status: He is alert.     GCS: GCS eye subscore is 4. GCS verbal subscore is 5. GCS motor subscore is 6.     Motor: Weakness (LUE: 3/5 strengh. LLE: 1/5 strength) present.     Comments: AOx2, tells me year is 2020  Psychiatric:        Mood and Affect: Mood and affect normal.     ED Results / Procedures / Treatments   Labs (all labs ordered are listed, but only abnormal results are displayed) Labs Reviewed  PROTIME-INR - Abnormal; Notable for the following components:      Result Value   Prothrombin Time 18.0 (*)    INR 1.6 (*)    All other components within normal limits  APTT - Abnormal; Notable for the following components:   aPTT 41 (*)    All other components within normal limits  CBC - Abnormal; Notable for the following components:   RBC 5.88 (*)    Hemoglobin 17.2 (*)    HCT 54.5 (*)    All other components within normal limits  DIFFERENTIAL - Abnormal; Notable for the following components:   Monocytes Absolute 1.4 (*)    All other components within normal limits  COMPREHENSIVE METABOLIC PANEL - Abnormal; Notable for the following components:   Glucose, Bld 192 (*)     Creatinine, Ser 1.68 (*)    Albumin 3.4 (*)    Total Bilirubin 1.5 (*)    GFR, Estimated 40 (*)    All other components within normal limits  I-STAT CHEM 8, ED - Abnormal; Notable for the following components:   BUN 24 (*)    Creatinine, Ser 1.50 (*)    Glucose, Bld 186 (*)    Hemoglobin 18.0 (*)    HCT 53.0 (*)    All other components within normal limits  CBG MONITORING, ED - Abnormal; Notable for the following components:   Glucose-Capillary 176 (*)    All other components within normal limits  URINALYSIS, ROUTINE W REFLEX MICROSCOPIC    EKG EKG Interpretation  Date/Time:  Sunday August 09 2020 18:54:04 EST Ventricular Rate:  82 PR Interval:    QRS Duration: 80 QT Interval:  370 QTC Calculation: 432 R Axis:   20 Text Interpretation: Atrial fibrillation Anteroseptal infarct , age undetermined Abnormal ECG Confirmed by Carmin Muskrat [id number]) on 08/09/2020 11:13:53 PM   Radiology DG Elbow 2 Views Left  Result Date: 08/09/2020 CLINICAL DATA:  Elbow pain, no known injury, initial encounter EXAM: LEFT ELBOW - 2 VIEW COMPARISON:  None. FINDINGS: Examination is somewhat limited  due to patient's inability to adequately position himself. No definitive fracture or dislocation is noted. Mild soft tissue swelling is seen. IMPRESSION: No acute bony abnormality noted. Mild soft tissue swelling is noted. Electronically Signed   By: Inez Catalina M.D.   On: 08/09/2020 21:33   CT HEAD WO CONTRAST  Result Date: 08/09/2020 CLINICAL DATA:  Left facial droop and left arm weakness. EXAM: CT HEAD WITHOUT CONTRAST TECHNIQUE: Contiguous axial images were obtained from the base of the skull through the vertex without intravenous contrast. COMPARISON:  08/05/2020 FINDINGS: Brain: Stable moderately enlarged ventricles and subarachnoid spaces. Stable moderate patchy white matter low density in both cerebral hemispheres. No intracranial hemorrhage, mass lesion or CT evidence of acute infarction. Vascular:  No hyperdense vessel or unexpected calcification. Skull: Normal. Negative for fracture or focal lesion. Sinuses/Orbits: Unremarkable. Other: None. IMPRESSION: 1. No acute abnormality. 2. Stable moderate diffuse cerebral and cerebellar atrophy. 3. Stable moderate chronic small vessel white matter ischemic changes in both cerebral hemispheres. Electronically Signed   By: Claudie Revering M.D.   On: 08/09/2020 20:10    Procedures Procedures   Medications Ordered in ED Medications  sodium chloride flush (NS) 0.9 % injection 3 mL (3 mLs Intravenous Not Given 08/09/20 2103)  lactated ringers bolus 1,000 mL (1,000 mLs Intravenous New Bag/Given 08/09/20 2225)    ED Course  I have reviewed the triage vital signs and the nursing notes.  Pertinent labs & imaging results that were available during my care of the patient were reviewed by me and considered in my medical decision making (see chart for details).    MDM Rules/Calculators/A&P                          This is an 85 year old male with a past medical history of CHF, COPD, diverticulosis, type 2 diabetes mellitus, obesity, hypertension, chronic kidney disease, recent ischemic stroke with residual left-sided deficits who is admitted on the 26th and discharged 2 days ago back to her facility who presents from the facility for concerns of confusion, left elbow pain, increased work of breathing.  Patient was incidentally diagnosed with COVID-19 despite being asymptomatic, vaccinated and received his booster during his previous admission for stroke.  During his admission he did not have any increased work of breathing, require oxygen, or need steroids/antiretrovirals.  Today, the nurse at the facility reports that she thought he was a little more confused than normal and was repeating questions and was unable to tell her what was bothering him, all she could get out of him with that his left elbow hurt.  She reports she took vitals and they were grossly  unremarkable except she thought that he has some increased work of breathing.  On arrival to the emergency department he is afebrile, hemodynamically stable, no acute distress.  He is oriented to himself, place, tells me the year is 2020, and is also oriented to the current situation is he is able to tell me about his recent hospitalization for stroke.  His oxygen saturations are 94% on room air.  He has no significant increased work of breathing.  He does have decreased strength of the left lower extremity as well as left upper extremity consistent with his recent ischemic stroke.  Evaluate in the left upper extremity, he has tenderness to palpation of the medial lateral aspect of the elbow as well as pain with range of motion of the elbow, it is not erythematous or hot  to the touch.  Work-up in the emergency department shows unchanged CT of the head, left elbow plain films show no acute traumatic injuries.  Laboratory work-up shows some hemoconcentration as well as elevations in the BUN and creatinine near his normal chronic kidney disease levels, likely consistent with mild dehydration.  He was given 1 L of fluid in the emergency department.  Regarding his left elbow pain, this is likely pain from nonuse as he has weakness from his prior stroke.  He has no significant swelling of the left upper extremity and is not significantly warm to concern me for underlying infection or DVT.  Also of note patient is on Eliquis due to the recent ischemic stroke and cardiac history, breakthrough DVT seems less likely.  With the pain of ranging the elbow, I believe this to be because he has not been able to move it since his stroke.  He may be slightly confused from his baseline that was reported by the facility, we will plan to obtain a urinalysis to evaluate for acute urinary tract infection, if urine appears infectious, anticipate that he is stable for discharge back to the facility with p.o. antibiotics.  If urine  is unremarkable, I still believe patient's work-up is grossly normal and he would be stable to be discharged back to the facility.  He has no new weakness to concern me for progression of stroke, he has no evidence of hemorrhagic conversion on the CT scan today.  He may be partially dehydrated when she was given the fluids as above and would recommend p.o. hydration at the facility.   Final Clinical Impression(s) / ED Diagnoses Final diagnoses:  Dehydration  COVID-19  Vision loss of right eye    Rx / DC Orders ED Discharge Orders    None       Camila Li, MD 08/09/20 [place]    Carmin Muskrat, MD 08/12/20 2359

## 2020-08-10 DIAGNOSIS — E119 Type 2 diabetes mellitus without complications: Secondary | ICD-10-CM | POA: Diagnosis not present

## 2020-08-10 DIAGNOSIS — R2681 Unsteadiness on feet: Secondary | ICD-10-CM | POA: Diagnosis not present

## 2020-08-10 DIAGNOSIS — J449 Chronic obstructive pulmonary disease, unspecified: Secondary | ICD-10-CM | POA: Diagnosis not present

## 2020-08-10 DIAGNOSIS — G4733 Obstructive sleep apnea (adult) (pediatric): Secondary | ICD-10-CM | POA: Diagnosis not present

## 2020-08-10 DIAGNOSIS — U071 COVID-19: Secondary | ICD-10-CM | POA: Diagnosis not present

## 2020-08-10 DIAGNOSIS — I739 Peripheral vascular disease, unspecified: Secondary | ICD-10-CM | POA: Diagnosis not present

## 2020-08-10 DIAGNOSIS — I5032 Chronic diastolic (congestive) heart failure: Secondary | ICD-10-CM | POA: Diagnosis not present

## 2020-08-10 DIAGNOSIS — M6281 Muscle weakness (generalized): Secondary | ICD-10-CM | POA: Diagnosis not present

## 2020-08-10 DIAGNOSIS — I4891 Unspecified atrial fibrillation: Secondary | ICD-10-CM | POA: Diagnosis not present

## 2020-08-10 DIAGNOSIS — N184 Chronic kidney disease, stage 4 (severe): Secondary | ICD-10-CM | POA: Diagnosis not present

## 2020-08-10 DIAGNOSIS — E782 Mixed hyperlipidemia: Secondary | ICD-10-CM | POA: Diagnosis not present

## 2020-08-10 DIAGNOSIS — I10 Essential (primary) hypertension: Secondary | ICD-10-CM | POA: Diagnosis not present

## 2020-08-10 LAB — URINALYSIS, ROUTINE W REFLEX MICROSCOPIC
Bilirubin Urine: NEGATIVE
Glucose, UA: NEGATIVE mg/dL
Hgb urine dipstick: NEGATIVE
Ketones, ur: NEGATIVE mg/dL
Leukocytes,Ua: NEGATIVE
Nitrite: NEGATIVE
Protein, ur: 100 mg/dL — AB
Specific Gravity, Urine: 1.014 (ref 1.005–1.030)
pH: 6 (ref 5.0–8.0)

## 2020-08-10 NOTE — ED Notes (Signed)
[facility] called  

## 2020-08-10 NOTE — ED Notes (Signed)
Spoke to Gloria [place] at [facility] to give report for discharge.

## 2020-08-10 NOTE — ED Notes (Signed)
[facility] at bedside 

## 2020-08-11 DIAGNOSIS — I152 Hypertension secondary to endocrine disorders: Secondary | ICD-10-CM | POA: Diagnosis not present

## 2020-08-11 DIAGNOSIS — L309 Dermatitis, unspecified: Secondary | ICD-10-CM | POA: Diagnosis not present

## 2020-08-11 DIAGNOSIS — E662 Morbid (severe) obesity with alveolar hypoventilation: Secondary | ICD-10-CM | POA: Diagnosis not present

## 2020-08-11 DIAGNOSIS — E1159 Type 2 diabetes mellitus with other circulatory complications: Secondary | ICD-10-CM | POA: Diagnosis not present

## 2020-08-11 DIAGNOSIS — G8194 Hemiplegia, unspecified affecting left nondominant side: Secondary | ICD-10-CM | POA: Diagnosis not present

## 2020-08-11 DIAGNOSIS — I5032 Chronic diastolic (congestive) heart failure: Secondary | ICD-10-CM | POA: Diagnosis not present

## 2020-08-11 DIAGNOSIS — U071 COVID-19: Secondary | ICD-10-CM | POA: Diagnosis not present

## 2020-08-11 DIAGNOSIS — I480 Paroxysmal atrial fibrillation: Secondary | ICD-10-CM | POA: Diagnosis not present

## 2020-08-11 DIAGNOSIS — K5901 Slow transit constipation: Secondary | ICD-10-CM | POA: Diagnosis not present

## 2020-08-11 DIAGNOSIS — I6389 Other cerebral infarction: Secondary | ICD-10-CM | POA: Diagnosis not present

## 2020-08-11 DIAGNOSIS — R609 Edema, unspecified: Secondary | ICD-10-CM | POA: Diagnosis not present

## 2020-08-11 LAB — CULTURE, BLOOD (ROUTINE X 2)
Culture: NO GROWTH
Culture: NO GROWTH
Special Requests: ADEQUATE
Special Requests: ADEQUATE

## 2020-08-17 DIAGNOSIS — G8194 Hemiplegia, unspecified affecting left nondominant side: Secondary | ICD-10-CM | POA: Diagnosis not present

## 2020-08-17 DIAGNOSIS — I5032 Chronic diastolic (congestive) heart failure: Secondary | ICD-10-CM | POA: Diagnosis not present

## 2020-08-17 DIAGNOSIS — R600 Localized edema: Secondary | ICD-10-CM | POA: Diagnosis not present

## 2020-08-17 DIAGNOSIS — E119 Type 2 diabetes mellitus without complications: Secondary | ICD-10-CM | POA: Diagnosis not present

## 2020-08-24 DIAGNOSIS — R2681 Unsteadiness on feet: Secondary | ICD-10-CM | POA: Diagnosis not present

## 2020-08-24 DIAGNOSIS — E119 Type 2 diabetes mellitus without complications: Secondary | ICD-10-CM | POA: Diagnosis not present

## 2020-08-24 DIAGNOSIS — N184 Chronic kidney disease, stage 4 (severe): Secondary | ICD-10-CM | POA: Diagnosis not present

## 2020-08-24 DIAGNOSIS — I10 Essential (primary) hypertension: Secondary | ICD-10-CM | POA: Diagnosis not present

## 2020-08-24 DIAGNOSIS — I739 Peripheral vascular disease, unspecified: Secondary | ICD-10-CM | POA: Diagnosis not present

## 2020-08-24 DIAGNOSIS — I5032 Chronic diastolic (congestive) heart failure: Secondary | ICD-10-CM | POA: Diagnosis not present

## 2020-08-24 DIAGNOSIS — G4733 Obstructive sleep apnea (adult) (pediatric): Secondary | ICD-10-CM | POA: Diagnosis not present

## 2020-08-24 DIAGNOSIS — I4891 Unspecified atrial fibrillation: Secondary | ICD-10-CM | POA: Diagnosis not present

## 2020-08-24 DIAGNOSIS — H401134 Primary open-angle glaucoma, bilateral, indeterminate stage: Secondary | ICD-10-CM | POA: Diagnosis not present

## 2020-08-24 DIAGNOSIS — E782 Mixed hyperlipidemia: Secondary | ICD-10-CM | POA: Diagnosis not present

## 2020-08-24 DIAGNOSIS — M6281 Muscle weakness (generalized): Secondary | ICD-10-CM | POA: Diagnosis not present

## 2020-08-24 DIAGNOSIS — J449 Chronic obstructive pulmonary disease, unspecified: Secondary | ICD-10-CM | POA: Diagnosis not present

## 2020-08-31 DIAGNOSIS — M6281 Muscle weakness (generalized): Secondary | ICD-10-CM | POA: Diagnosis not present

## 2020-08-31 DIAGNOSIS — I10 Essential (primary) hypertension: Secondary | ICD-10-CM | POA: Diagnosis not present

## 2020-08-31 DIAGNOSIS — E119 Type 2 diabetes mellitus without complications: Secondary | ICD-10-CM | POA: Diagnosis not present

## 2020-08-31 DIAGNOSIS — E782 Mixed hyperlipidemia: Secondary | ICD-10-CM | POA: Diagnosis not present

## 2020-08-31 DIAGNOSIS — I4891 Unspecified atrial fibrillation: Secondary | ICD-10-CM | POA: Diagnosis not present

## 2020-08-31 DIAGNOSIS — J449 Chronic obstructive pulmonary disease, unspecified: Secondary | ICD-10-CM | POA: Diagnosis not present

## 2020-08-31 DIAGNOSIS — N184 Chronic kidney disease, stage 4 (severe): Secondary | ICD-10-CM | POA: Diagnosis not present

## 2020-08-31 DIAGNOSIS — I5032 Chronic diastolic (congestive) heart failure: Secondary | ICD-10-CM | POA: Diagnosis not present

## 2020-08-31 DIAGNOSIS — R2681 Unsteadiness on feet: Secondary | ICD-10-CM | POA: Diagnosis not present

## 2020-08-31 DIAGNOSIS — G4733 Obstructive sleep apnea (adult) (pediatric): Secondary | ICD-10-CM | POA: Diagnosis not present

## 2020-08-31 DIAGNOSIS — I739 Peripheral vascular disease, unspecified: Secondary | ICD-10-CM | POA: Diagnosis not present

## 2020-09-01 DIAGNOSIS — I739 Peripheral vascular disease, unspecified: Secondary | ICD-10-CM | POA: Diagnosis not present

## 2020-09-01 DIAGNOSIS — I10 Essential (primary) hypertension: Secondary | ICD-10-CM | POA: Diagnosis not present

## 2020-09-01 DIAGNOSIS — E785 Hyperlipidemia, unspecified: Secondary | ICD-10-CM | POA: Diagnosis not present

## 2020-09-01 DIAGNOSIS — I5032 Chronic diastolic (congestive) heart failure: Secondary | ICD-10-CM | POA: Diagnosis not present

## 2020-09-01 DIAGNOSIS — J984 Other disorders of lung: Secondary | ICD-10-CM | POA: Diagnosis not present

## 2020-09-01 DIAGNOSIS — E119 Type 2 diabetes mellitus without complications: Secondary | ICD-10-CM | POA: Diagnosis not present

## 2020-09-01 DIAGNOSIS — I480 Paroxysmal atrial fibrillation: Secondary | ICD-10-CM | POA: Diagnosis not present

## 2020-09-01 DIAGNOSIS — N184 Chronic kidney disease, stage 4 (severe): Secondary | ICD-10-CM | POA: Diagnosis not present

## 2020-09-01 DIAGNOSIS — Z9189 Other specified personal risk factors, not elsewhere classified: Secondary | ICD-10-CM | POA: Diagnosis not present

## 2020-09-08 ENCOUNTER — Encounter: Payer: Self-pay | Admitting: Adult Health

## 2020-09-08 ENCOUNTER — Ambulatory Visit (INDEPENDENT_AMBULATORY_CARE_PROVIDER_SITE_OTHER): Payer: Medicare Other | Admitting: Adult Health

## 2020-09-08 VITALS — BP 139/69 | HR 69 | Ht 71.0 in

## 2020-09-08 DIAGNOSIS — H5461 Unqualified visual loss, right eye, normal vision left eye: Secondary | ICD-10-CM | POA: Diagnosis not present

## 2020-09-08 DIAGNOSIS — G8194 Hemiplegia, unspecified affecting left nondominant side: Secondary | ICD-10-CM

## 2020-09-08 DIAGNOSIS — H3411 Central retinal artery occlusion, right eye: Secondary | ICD-10-CM | POA: Diagnosis not present

## 2020-09-08 DIAGNOSIS — I639 Cerebral infarction, unspecified: Secondary | ICD-10-CM

## 2020-09-08 NOTE — Patient Instructions (Signed)
Residual deficit of left hemiparesis from recent stroke and right eye visual loss from prior stroke -encourage continued participation with therapies at SNF for hopeful ongoing recovery  Continue aspirin 81 mg daily and Eliquis (apixaban) daily  and atorvastatin for secondary stroke prevention  Ensure follow-up with vascular surgery Dr. Trula Slade around August or September for repeat carotid ultrasound  Continue to follow with cardiology as advised for atrial fibrillation and Eliquis management  Continue to follow up with PCP regarding cholesterol, blood pressure and diabetes management  Maintain strict control of hypertension with blood pressure goal below 130/90, diabetes with hemoglobin A1c goal below 7.0 % and cholesterol with LDL cholesterol (bad cholesterol) goal below 70 mg/dL.       Followup in the future with me in 3 months or call earlier if needed       Thank you for coming to see [place] at [facility]. I hope we have been able to provide you high quality care today.  You may receive a patient satisfaction survey over the next few weeks. We would appreciate your feedback and comments so that we may continue to improve ourselves and the health of our patients.

## 2020-09-08 NOTE — Progress Notes (Signed)
I agree with the above plan 

## 2020-09-08 NOTE — Progress Notes (Signed)
[facility] [address] [place]. [place] [address] [phone]       HOSPITAL FOLLOW UP NOTE  [name] Date of Birth:  08/09/1935 Medical Record Number:  [id number]   Reason for Referral:  hospital stroke follow up    SUBJECTIVE:   CHIEF COMPLAINT:  Chief Complaint  Patient presents with  . Follow-up    TR with CNA (anna) Pt is having numbness L side, blind in R eye    HPI:   Today, 09/08/2020, Reginald [name] returns for sooner scheduled visit due to recent hospitalization.  He is accompanied by SNF CNA Anna.  He presented to [facility] ED on 08/05/2020 for left-sided arm weakness and left facial droop with stroke work-up revealing R PLIC infarct likely secondary to small vessel disease source.  Recommended continuation of aspirin and Eliquis for secondary stroke prevention and known atrial fibrillation.  Found to be COVID + during admission (also COVID + 11/2018) although currently asymptomatic.  A1c 7.4.  LDL 45.  Evaluated by therapies and recommended discharge back to SNF.  Stroke:  right PLIC infarct, likely secondary to small vessel disease source  Resultant left hemiparesis  CT head no acute abnormality  MRI right PLIC small infarct  MRA no LVO.  Moderate stenosis at left paraclinoid ICA and left proximal M1.  Short segment severe stenosis at the distal P2/P3.  Overall appearance is similar compared to prior CTA on 06/09/2020.  Diffuse small vessel atherosclerosis distal MCA and PCA branches.  Carotid Doppler 07/13/2020 - no significant stenosis bilaterally  2D Echo EF 55%, last TTE 06/2020 EF 55 to 60%.  LDL 45  HgbA1c 7.4  Eliquis for VTE prophylaxis  aspirin 81 mg daily and Eliquis (apixaban) daily prior to admission, now on aspirin 81 mg daily and Eliquis (apixaban) daily.  Given improved creatinine, consult pharmacy for appropriate dosing  Ongoing aggressive stroke risk factor management  Therapy recommendations:  SNF  Disposition:  SNF  Since discharge, he reports residual left-sided weakness and numbness but has been slowly improving currently working with therapies.  Chronic right eye visual loss stable without worsening He is nonambulatory at baseline and transfers via wheelchair Denies new stroke/TIA symptoms  Remains on aspirin and Eliquis -denies side effects Remains on atorvastatin 20 mg daily -denies side effects Blood pressure today 139/69 Glucose levels have been stable per report  No concerns at this time    History provided for reference purposes only Hospital f/u visit 07/20/2020 Reginald [name]: Reginald [name] is being seen for hospital follow-up unaccompanied.  He continues to reside at [facility] and rehab.  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  He has since been evaluated by ophthalmology since discharge for cataract evaluation and plans on continuing monitoring at this time due to recent stroke.  He plans on continue to follow with established ophthalmologist Dr. Gershon Crane in the near future.  Denies new stroke/TIA symptoms.  He remains wheelchair-bound over the past 2 years after knee injury per patient and has continued to reside at [facility] and rehab since that time.  Remains on aspirin, Plavix and Eliquis for secondary stroke prevention without side effects.  Recently seen by [facility] who advised to discontinue Plavix but per review of facility MAR, Plavix has been continued.  Carotid duplex showed no residual right ICA stenosis and plans on repeating duplex 9 months post procedure.  Remains on atorvastatin without myalgias.  Blood pressure today initially elevated but on recheck 140/82 -typically  120s-130s/80s at facility.  Glucose levels range 150-220 at facility.  Endorses nightly compliance with CPAP for OSA management.  No further concerns at this time.  Stroke admission 06/09/2020 Reginald [name] is a 85 y.o. male with history of COPD, coronary artery disease,  hypertension, hyperlipidemia, obstructive sleep apnea, diabetes, congestive heart failure, CKD, atrial fibrillation on apixaban, who presented on 06/09/2020 with sudden onset of painless vision loss in the right eye Sunday afternoon.   Personally reviewed hospitalization pertinent progress notes, lab work and imaging with summary provided.  Evaluated by Dr. Erlinda Hong with stroke work-up revealing R CRAO in setting of symptomatic R ICA stenosis and known AF on Eliquis.  MRI did show possible punctate subacute R precentral gyrus infarct involves old R occipital infarct.  CTA head/neck showed right ICA 70% stenosis and moderate to severe B VA stenosis.  Evaluated by Dr. Trula Slade and underwent TCAR and placed on DAPT with outpatient follow-up advised.  History of chronic A. fib with Eliquis held for TCAR procedure and restarted at discharge.  History of HTN stable during admission.  History of HLD with LDL 51 and continued home dose atorvastatin 40 mg daily.  Uncontrolled DM with A1c 8.1.  Other stroke risk factors include advanced age, morbid obesity, CAD, OSA on CPAP, CHF, PVD and prior strokes on imaging.  Other active problems include COPD, CKD and chronic venous insufficiency.  Evaluated by therapies and recommended discharge to SNF (originally from nursing home w/c bound past 2 years).  R CRAO in setting of symptomatic R ICA stenosis and known AF on Eliquis  MRI brain and orbits Probably punctate subacute R precentral gyrus infarct. 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 Moderate small vessel disease. Possible high L frontoparietal scalp lesion.  CT head no acute abnormality. 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 Small vessel disease. Atrophy. R posterior falx possible meningioma. 2.1cm L parietal scalp lesion.  CTA head no LVO. Intracranial atherosclerosis w/ multifocal stenoses:  Moderate paraclinoid L ICA, moderate proximal L M1, high-grade R A4, high-grade R P2, moderate / severe L P2/3 jxn    CTA neck R ICA 70% soft and calcified  plaque. Moderate to severe B VA origin stenoses.  2D Echo EF 55-60%. MV myxomatous.   LDL 51  HgbA1c 8.1  VTE prophylaxis - SCDs    Eliquis (apixaban) daily prior to admission, now on aspirin 81 mg daily and clopidogrel 75 mg daily following plavix load. (Eliquis stopped for TCAR)   Therapy recommendations:  SNF  Disposition:  Return SNF (from nsg home, w/c bound past 2 yrs)      ROS:   14 system review of systems performed and negative with exception of those listed in HPI  PMH:  Past Medical History:  Diagnosis Date  . Anteroseptal myocardial infarction ([place])   . Carotid artery occlusion   . CHF (congestive heart failure) ([place])   . Chronic kidney disease   . COPD (chronic obstructive pulmonary disease) ([place])   . Coronary atherosclerosis of unspecified type of vessel, native or graft    a. s/p prior PCI to LAD in 1997  . Diverticulosis   . ED (erectile dysfunction)   . Foley catheter in place   . Glaucoma   . Hematuria   . History of 2019 novel coronavirus disease (COVID-19) 11/2018  . Hydronephrosis, bilateral   . Hydroureter   . Hypercholesteremia   . Hypertensive renal disease   . Hypogonadism male   . Hypokalemia   . Obesity hypoventilation syndrome ([place])   .  Obesity, unspecified   . OSA (obstructive sleep apnea)   . Peripheral vascular disease ([place])   . Permanent atrial fibrillation ([place])   . Phimosis   . Pneumonia   . Rhabdomyolysis   . Tremor   . Type II or unspecified type diabetes mellitus without mention of complication, not stated as uncontrolled   . Unspecified essential hypertension   . Urinary incontinence   . Urinary retention   . Vestibulopathy     PSH:  Past Surgical History:  Procedure Laterality Date  . APPENDECTOMY  1985  . CARDIAC CATHETERIZATION    . CHOLECYSTECTOMY  06/2000  . CIRCUMCISION N/A 05/28/2020   Procedure: CIRCUMCISION ADULT;  Surgeon: Franchot Gallo, MD;  Location: [facility] ORS;  Service: Urology;  Laterality: N/A;   . CORONARY ANGIOPLASTY  08/14/1995   stent placement to LAD   . DORSAL SLIT N/A 05/28/2020   Procedure: DORSAL SLIT;  Surgeon: Franchot Gallo, MD;  Location: [facility] ORS;  Service: Urology;  Laterality: N/A;  43 MINS  . ENDOVENOUS ABLATION SAPHENOUS VEIN W/ LASER Left 02/21/2018   endovenous laser ablation L GSV by Ruta Hinds MD   . [place] Right 1985  . KNEE ARTHROSCOPY Left 1991  . LUMBAR FUSION    . NOSE SURGERY  1970s   Per Dr. Terrence Dupont [facility] in pt chart  . TRANSCAROTID ARTERY REVASCULARIZATION Right 06/12/2020   Procedure: RIGHT TRANSCAROTID ARTERY REVASCULARIZATION;  Surgeon: Serafina Mitchell, MD;  Location: [facility] OR;  Service: Vascular;  Laterality: Right;    Social History:  Social History   Socioeconomic History  . Marital status: Single    Spouse name: Not on file  . Number of children: 5  . Years of education: Not on file  . Highest education level: Not on file  Occupational History  . Occupation: retired  Tobacco Use  . Smoking status: Never Smoker  . Smokeless tobacco: Never Used  Vaping Use  . Vaping Use: Never used  Substance and Sexual Activity  . Alcohol use: No    Alcohol/week: 0.0 standard drinks  . Drug use: No  . Sexual activity: Never  Other Topics Concern  . Not on file  Social History Narrative  . Not on file   Social Determinants of Health   Financial Resource Strain: Not on file  Food Insecurity: Not on file  Transportation Needs: Not on file  Physical Activity: Not on file  Stress: Not on file  Social Connections: Not on file  Intimate Partner Violence: Not on file    Family History:  Family History  Problem Relation Age of Onset  . COPD Mother   . Heart disease Mother   . Diabetes Father   . Heart disease Father   . Diabetes Sister   . Heart disease Brother   . Heart disease Brother   . Breast cancer Maternal Aunt   . Diabetes Paternal Grandmother   . Colon cancer Maternal Aunt   . Ovarian cancer Daughter   .  Glaucoma Other     Medications:   Current Outpatient Medications on File Prior to Visit  Medication Sig Dispense Refill  . acetaminophen (TYLENOL) 325 MG tablet Take 650 mg by mouth every 4 (four) hours as needed for moderate pain.     Marland Kitchen albuterol (VENTOLIN HFA) 108 (90 Base) MCG/ACT inhaler Inhale 2 puffs into the lungs 2 (two) times daily as needed for wheezing or shortness of breath.    Marland Kitchen apixaban (ELIQUIS) 5 MG TABS tablet Take  1 tablet (5 mg total) by mouth 2 (two) times daily. 60 tablet 0  . aspirin EC 81 MG EC tablet Take 1 tablet (81 mg total) by mouth daily. Swallow whole. 30 tablet 11  . atorvastatin (LIPITOR) 20 MG tablet Take 40 mg by mouth at bedtime.    . cetirizine (ZYRTEC) 10 MG tablet Take 5 mg by mouth at bedtime.     . cholecalciferol (VITAMIN D) 25 MCG (1000 UNIT) tablet Take 2,000 Units by mouth daily.    Marland Kitchen DEXTRAN 70-HYPROMELLOSE, PF, OP Apply 1 drop to eye in the morning, at noon, in the evening, and at bedtime.    . Dulaglutide (TRULICITY) 3 PO/2.4MP SOPN Inject 3 mg into the skin once a week. Mondays    . gabapentin (NEURONTIN) 100 MG capsule Take 2 capsules (200 mg total) by mouth at bedtime as needed.    . insulin lispro (HUMALOG) 100 UNIT/ML injection Inject 0-14 Units into the skin See admin instructions. Inject 0-14 units subcutaneously twice daily per sliding scale: CBG 0-200 0 units, 201-250 2 units, 251-300 4 units, 301-350 6 units, 351-400 8 units, 401-450 10 units, 451-500 12 units, >500 14 units If blood sugar is > 400 or < 80 call pec triage    . latanoprost (XALATAN) 0.005 % ophthalmic solution Place 1 drop into both eyes at bedtime.    . magnesium oxide (MAG-OX) 400 MG tablet Take 400 mg by mouth daily.    . Melatonin 5 MG TABS Take 5 mg by mouth at bedtime.    . Menthol, Topical Analgesic, (BIOFREEZE) 4 % GEL Apply 1 application topically 2 (two) times daily as needed (forehead and left arm pain).    . metoprolol succinate (TOPROL XL) 25 MG 24 hr tablet  Take 0.5 tablets (12.5 mg total) by mouth daily. 15 tablet 11  . nitroGLYCERIN (NITROSTAT) 0.4 MG SL tablet Place 0.4 mg under the tongue every 5 (five) minutes as needed for chest pain.    Marland Kitchen oxybutynin (DITROPAN) 5 MG tablet Take 5 mg by mouth every 8 (eight) hours as needed for bladder spasms.     . polyethylene glycol (MIRALAX / GLYCOLAX) 17 g packet Take 17 g by mouth daily. Mix in 6 oz fluid and drink    . repaglinide (PRANDIN) 0.5 MG tablet Take 0.5 mg by mouth daily.    . Skin Protectants, Misc. (EUCERIN) cream Apply 1 application topically 2 (two) times a day. For itching    . sodium chloride (OCEAN) 0.65 % SOLN nasal spray Place 1 spray into both nostrils as directed. 1 Spray TID & 1 Spray Every 2 Hours PRN For Allergies/Congestion    . tamsulosin (FLOMAX) 0.4 MG CAPS capsule Take 1 capsule (0.4 mg total) by mouth daily after supper. 30 capsule 0  . vitamin B-12 (CYANOCOBALAMIN) 1000 MCG tablet Take 1,000 mcg by mouth daily.     No current facility-administered medications on file prior to visit.    Allergies:   Allergies  Allergen Reactions  . Adhesive [Tape] Rash      OBJECTIVE:  Physical Exam  Vitals:   09/08/20 1256  BP: 139/69  Pulse: 69  Height: 5\' 11"  (1.803 m)   Body mass index is 42.68 kg/m. No exam data present  Post stroke PHQ 2/9 Depression screen PHQ 2/9 07/20/2020  Decreased Interest 0  Down, Depressed, Hopeless 0  PHQ - 2 Score 0     General: Morbidly obese very pleasant elderly Caucasian male, seated, in no evident  distress Head: head normocephalic and atraumatic.   Neck: supple with no carotid or supraclavicular bruits Cardiovascular: irregular rate and rhythm, no murmurs Musculoskeletal: no deformity Skin:  BLE dressings (for edema per patient) Vascular:  Normal pulses all extremities   Neurologic Exam Mental Status: Awake and fully alert.  Fluent speech and language. Oriented to place and time. Recent and remote memory intact. Attention  span, concentration and fund of knowledge appropriate. Mood and affect appropriate.  Cranial Nerves: Pupils equal, briskly reactive to light. Extraocular movements full without nystagmus. Visual fields full to confrontation OS and OD blindness except upper outer edge. Hearing intact. Facial sensation intact.  Mild left facial weakness when smiling.  Tongue and palate moves normally and symmetrically.  Motor: Normal bulk and tone and strength RUE and RLE.  LUE: 4/5 distally with increased tone; LLE: 2/5 hip flexor, 4/5 distally Sensory.: intact to touch , pinprick , position and vibratory sensation.  Coordination: Rapid alternating movements normal in all extremities except decreased left hand. Finger-to-nose and heel-to-shin performed accurately on right side. Gait and Station: Deferred as patient nonambulatory Reflexes: 1+ and symmetric. Toes downgoing.     NIHSS  3 Modified Rankin  2      ASSESSMENT: Reginald [name] is a 85 y.o. year old male with recent R PLIC stroke 5/00/3704 likely secondary to small vessel disease and prior R CRAO 06/09/2020 secondary to right ICA stenosis s/p TCAR with residual OD visual loss.  Vascular risk factors include chronic A. fib on Eliquis, prior strokes on imaging, HTN, HLD, DM, CAD, OSA on CPAP, carotid stenosis, CHF, prior strokes on imaging and COVID + 07/2020 and 11/2018.      PLAN:  1. R PLIC stroke, SVD 2. R CARO 3. Multiple prior strokes on imaging:  a. Residual deficit: Left hemiparesis, subjective numbness and OD visual loss  i. Encouraged continued participation with SNF therapies for hopeful ongoing recovery ii. Routinely followed by ophthalmology b. Continue aspirin 81 mg daily and Eliquis (apixaban) daily  and atorvastatin 40 mg daily for secondary stroke prevention.   c. Discussed secondary stroke prevention measures and importance of close PCP follow up for aggressive stroke risk factor management  4. R ICA stenosis:  a. s/p TCAR  06/12/2020 b. carotid duplex 07/13/2020 right ICA no evidence of stenosis and left ICA 1 to 39% stenosis with ECA > 50% c.  plan on repeat carotid duplex with Dr. Trula Slade 9 months post procedure.   5. Chronic A. Fib:  a. On Eliquis 5 mg twice daily for CHA2DS2-VASc score of at least 8.   b. Asymptomatic irregular rhythm.   c. Routinely followed by cardiology 6. HTN: BP goal <130/90.  Well-controlled continuously monitored by facility 7. HLD: LDL goal <70. Recent LDL 45 on atorvastatin 40 mg daily per PCP.  8. DMII: A1c goal<7.0. Recent A1c 7.4 down from 8.1. on Trulicity and Humalog per facility    Follow up in 3 months or call earlier if needed   CC:  [facility] provider: Dr. Gertie Fey, Rozanna Boer, MD     I spent 45 minutes of face-to-face and non-face-to-face time with patient and SNF CNA.  This included previsit chart review including recent hospitalization pertinent progress notes, lab work and imaging, lab review, study review, order entry, electronic health record documentation, patient education and discussion regarding recent ischemic stroke with residual deficits and etiology, history of OD CRAO and prior strokes on imaging, importance of managing stroke risk factors and answered all other questions  to patient satisfaction   Reginald [name], [facility]  [facility] [address] [place], [place] [address]  Phone [phone] Fax [phone] Note: This document was prepared with digital dictation and possible smart phrase technology. Any transcriptional errors that result from this process are unintentional.

## 2020-09-09 DIAGNOSIS — E119 Type 2 diabetes mellitus without complications: Secondary | ICD-10-CM | POA: Diagnosis not present

## 2020-09-09 DIAGNOSIS — E7849 Other hyperlipidemia: Secondary | ICD-10-CM | POA: Diagnosis not present

## 2020-09-09 DIAGNOSIS — Z8673 Personal history of transient ischemic attack (TIA), and cerebral infarction without residual deficits: Secondary | ICD-10-CM | POA: Diagnosis not present

## 2020-09-11 DIAGNOSIS — N184 Chronic kidney disease, stage 4 (severe): Secondary | ICD-10-CM | POA: Diagnosis not present

## 2020-09-11 DIAGNOSIS — M6281 Muscle weakness (generalized): Secondary | ICD-10-CM | POA: Diagnosis not present

## 2020-09-11 DIAGNOSIS — I739 Peripheral vascular disease, unspecified: Secondary | ICD-10-CM | POA: Diagnosis not present

## 2020-09-11 DIAGNOSIS — I5032 Chronic diastolic (congestive) heart failure: Secondary | ICD-10-CM | POA: Diagnosis not present

## 2020-09-11 DIAGNOSIS — E119 Type 2 diabetes mellitus without complications: Secondary | ICD-10-CM | POA: Diagnosis not present

## 2020-09-11 DIAGNOSIS — I10 Essential (primary) hypertension: Secondary | ICD-10-CM | POA: Diagnosis not present

## 2020-09-11 DIAGNOSIS — J449 Chronic obstructive pulmonary disease, unspecified: Secondary | ICD-10-CM | POA: Diagnosis not present

## 2020-09-11 DIAGNOSIS — G4733 Obstructive sleep apnea (adult) (pediatric): Secondary | ICD-10-CM | POA: Diagnosis not present

## 2020-09-11 DIAGNOSIS — R2681 Unsteadiness on feet: Secondary | ICD-10-CM | POA: Diagnosis not present

## 2020-09-11 DIAGNOSIS — I4891 Unspecified atrial fibrillation: Secondary | ICD-10-CM | POA: Diagnosis not present

## 2020-09-11 DIAGNOSIS — E782 Mixed hyperlipidemia: Secondary | ICD-10-CM | POA: Diagnosis not present

## 2020-09-14 DIAGNOSIS — M6281 Muscle weakness (generalized): Secondary | ICD-10-CM | POA: Diagnosis not present

## 2020-09-14 DIAGNOSIS — I5032 Chronic diastolic (congestive) heart failure: Secondary | ICD-10-CM | POA: Diagnosis not present

## 2020-09-14 DIAGNOSIS — I4891 Unspecified atrial fibrillation: Secondary | ICD-10-CM | POA: Diagnosis not present

## 2020-09-14 DIAGNOSIS — E119 Type 2 diabetes mellitus without complications: Secondary | ICD-10-CM | POA: Diagnosis not present

## 2020-09-14 DIAGNOSIS — J449 Chronic obstructive pulmonary disease, unspecified: Secondary | ICD-10-CM | POA: Diagnosis not present

## 2020-09-14 DIAGNOSIS — G4733 Obstructive sleep apnea (adult) (pediatric): Secondary | ICD-10-CM | POA: Diagnosis not present

## 2020-09-14 DIAGNOSIS — R2681 Unsteadiness on feet: Secondary | ICD-10-CM | POA: Diagnosis not present

## 2020-09-14 DIAGNOSIS — I10 Essential (primary) hypertension: Secondary | ICD-10-CM | POA: Diagnosis not present

## 2020-09-14 DIAGNOSIS — I739 Peripheral vascular disease, unspecified: Secondary | ICD-10-CM | POA: Diagnosis not present

## 2020-09-14 DIAGNOSIS — N184 Chronic kidney disease, stage 4 (severe): Secondary | ICD-10-CM | POA: Diagnosis not present

## 2020-09-14 DIAGNOSIS — E782 Mixed hyperlipidemia: Secondary | ICD-10-CM | POA: Diagnosis not present

## 2020-09-15 DIAGNOSIS — E781 Pure hyperglyceridemia: Secondary | ICD-10-CM | POA: Diagnosis not present

## 2020-09-15 DIAGNOSIS — I10 Essential (primary) hypertension: Secondary | ICD-10-CM | POA: Diagnosis not present

## 2020-09-15 DIAGNOSIS — R195 Other fecal abnormalities: Secondary | ICD-10-CM | POA: Diagnosis not present

## 2020-09-15 DIAGNOSIS — E119 Type 2 diabetes mellitus without complications: Secondary | ICD-10-CM | POA: Diagnosis not present

## 2020-09-24 DIAGNOSIS — N139 Obstructive and reflux uropathy, unspecified: Secondary | ICD-10-CM | POA: Diagnosis not present

## 2020-09-24 DIAGNOSIS — R41841 Cognitive communication deficit: Secondary | ICD-10-CM | POA: Diagnosis not present

## 2020-09-24 DIAGNOSIS — E119 Type 2 diabetes mellitus without complications: Secondary | ICD-10-CM | POA: Diagnosis not present

## 2020-09-24 DIAGNOSIS — N184 Chronic kidney disease, stage 4 (severe): Secondary | ICD-10-CM | POA: Diagnosis not present

## 2020-09-24 DIAGNOSIS — G9341 Metabolic encephalopathy: Secondary | ICD-10-CM | POA: Diagnosis not present

## 2020-09-24 DIAGNOSIS — M6281 Muscle weakness (generalized): Secondary | ICD-10-CM | POA: Diagnosis not present

## 2020-09-24 DIAGNOSIS — M6259 Muscle wasting and atrophy, not elsewhere classified, multiple sites: Secondary | ICD-10-CM | POA: Diagnosis not present

## 2020-09-24 DIAGNOSIS — R2681 Unsteadiness on feet: Secondary | ICD-10-CM | POA: Diagnosis not present

## 2020-09-24 DIAGNOSIS — R278 Other lack of coordination: Secondary | ICD-10-CM | POA: Diagnosis not present

## 2020-09-24 DIAGNOSIS — R262 Difficulty in walking, not elsewhere classified: Secondary | ICD-10-CM | POA: Diagnosis not present

## 2020-09-24 DIAGNOSIS — I480 Paroxysmal atrial fibrillation: Secondary | ICD-10-CM | POA: Diagnosis not present

## 2020-09-24 DIAGNOSIS — Z9181 History of falling: Secondary | ICD-10-CM | POA: Diagnosis not present

## 2020-09-24 DIAGNOSIS — R1312 Dysphagia, oropharyngeal phase: Secondary | ICD-10-CM | POA: Diagnosis not present

## 2020-09-24 DIAGNOSIS — J449 Chronic obstructive pulmonary disease, unspecified: Secondary | ICD-10-CM | POA: Diagnosis not present

## 2020-09-24 DIAGNOSIS — I639 Cerebral infarction, unspecified: Secondary | ICD-10-CM | POA: Diagnosis not present

## 2020-09-24 DIAGNOSIS — R2689 Other abnormalities of gait and mobility: Secondary | ICD-10-CM | POA: Diagnosis not present

## 2020-09-25 DIAGNOSIS — I5032 Chronic diastolic (congestive) heart failure: Secondary | ICD-10-CM | POA: Diagnosis not present

## 2020-09-25 DIAGNOSIS — G4733 Obstructive sleep apnea (adult) (pediatric): Secondary | ICD-10-CM | POA: Diagnosis not present

## 2020-09-25 DIAGNOSIS — R609 Edema, unspecified: Secondary | ICD-10-CM | POA: Diagnosis not present

## 2020-09-25 DIAGNOSIS — E0841 Diabetes mellitus due to underlying condition with diabetic mononeuropathy: Secondary | ICD-10-CM | POA: Diagnosis not present

## 2020-09-25 DIAGNOSIS — K068 Other specified disorders of gingiva and edentulous alveolar ridge: Secondary | ICD-10-CM | POA: Diagnosis not present

## 2020-09-25 DIAGNOSIS — I152 Hypertension secondary to endocrine disorders: Secondary | ICD-10-CM | POA: Diagnosis not present

## 2020-09-25 DIAGNOSIS — I779 Disorder of arteries and arterioles, unspecified: Secondary | ICD-10-CM | POA: Diagnosis not present

## 2020-09-25 DIAGNOSIS — I480 Paroxysmal atrial fibrillation: Secondary | ICD-10-CM | POA: Diagnosis not present

## 2020-09-25 DIAGNOSIS — I2510 Atherosclerotic heart disease of native coronary artery without angina pectoris: Secondary | ICD-10-CM | POA: Diagnosis not present

## 2020-09-25 DIAGNOSIS — I639 Cerebral infarction, unspecified: Secondary | ICD-10-CM | POA: Diagnosis not present

## 2020-09-25 DIAGNOSIS — F0390 Unspecified dementia without behavioral disturbance: Secondary | ICD-10-CM | POA: Diagnosis not present

## 2020-10-09 DIAGNOSIS — R41841 Cognitive communication deficit: Secondary | ICD-10-CM | POA: Diagnosis not present

## 2020-10-09 DIAGNOSIS — M6281 Muscle weakness (generalized): Secondary | ICD-10-CM | POA: Diagnosis not present

## 2020-10-09 DIAGNOSIS — M6259 Muscle wasting and atrophy, not elsewhere classified, multiple sites: Secondary | ICD-10-CM | POA: Diagnosis not present

## 2020-10-09 DIAGNOSIS — R2681 Unsteadiness on feet: Secondary | ICD-10-CM | POA: Diagnosis not present

## 2020-10-09 DIAGNOSIS — N184 Chronic kidney disease, stage 4 (severe): Secondary | ICD-10-CM | POA: Diagnosis not present

## 2020-10-09 DIAGNOSIS — I639 Cerebral infarction, unspecified: Secondary | ICD-10-CM | POA: Diagnosis not present

## 2020-10-09 DIAGNOSIS — N139 Obstructive and reflux uropathy, unspecified: Secondary | ICD-10-CM | POA: Diagnosis not present

## 2020-10-09 DIAGNOSIS — G9341 Metabolic encephalopathy: Secondary | ICD-10-CM | POA: Diagnosis not present

## 2020-10-09 DIAGNOSIS — Z9181 History of falling: Secondary | ICD-10-CM | POA: Diagnosis not present

## 2020-10-09 DIAGNOSIS — E119 Type 2 diabetes mellitus without complications: Secondary | ICD-10-CM | POA: Diagnosis not present

## 2020-10-09 DIAGNOSIS — J449 Chronic obstructive pulmonary disease, unspecified: Secondary | ICD-10-CM | POA: Diagnosis not present

## 2020-10-09 DIAGNOSIS — R2689 Other abnormalities of gait and mobility: Secondary | ICD-10-CM | POA: Diagnosis not present

## 2020-10-09 DIAGNOSIS — I480 Paroxysmal atrial fibrillation: Secondary | ICD-10-CM | POA: Diagnosis not present

## 2020-10-09 DIAGNOSIS — R262 Difficulty in walking, not elsewhere classified: Secondary | ICD-10-CM | POA: Diagnosis not present

## 2020-10-09 DIAGNOSIS — R1312 Dysphagia, oropharyngeal phase: Secondary | ICD-10-CM | POA: Diagnosis not present

## 2020-10-09 DIAGNOSIS — R278 Other lack of coordination: Secondary | ICD-10-CM | POA: Diagnosis not present

## 2020-10-27 DIAGNOSIS — N184 Chronic kidney disease, stage 4 (severe): Secondary | ICD-10-CM | POA: Diagnosis not present

## 2020-10-27 DIAGNOSIS — I7389 Other specified peripheral vascular diseases: Secondary | ICD-10-CM | POA: Diagnosis not present

## 2020-10-27 DIAGNOSIS — G8194 Hemiplegia, unspecified affecting left nondominant side: Secondary | ICD-10-CM | POA: Diagnosis not present

## 2020-10-27 DIAGNOSIS — K5901 Slow transit constipation: Secondary | ICD-10-CM | POA: Diagnosis not present

## 2020-10-27 DIAGNOSIS — J984 Other disorders of lung: Secondary | ICD-10-CM | POA: Diagnosis not present

## 2020-10-27 DIAGNOSIS — I480 Paroxysmal atrial fibrillation: Secondary | ICD-10-CM | POA: Diagnosis not present

## 2020-10-27 DIAGNOSIS — I2510 Atherosclerotic heart disease of native coronary artery without angina pectoris: Secondary | ICD-10-CM | POA: Diagnosis not present

## 2020-10-27 DIAGNOSIS — E119 Type 2 diabetes mellitus without complications: Secondary | ICD-10-CM | POA: Diagnosis not present

## 2020-10-27 DIAGNOSIS — I10 Essential (primary) hypertension: Secondary | ICD-10-CM | POA: Diagnosis not present

## 2020-10-27 DIAGNOSIS — Z8673 Personal history of transient ischemic attack (TIA), and cerebral infarction without residual deficits: Secondary | ICD-10-CM | POA: Diagnosis not present

## 2020-10-27 DIAGNOSIS — E7849 Other hyperlipidemia: Secondary | ICD-10-CM | POA: Diagnosis not present

## 2020-11-03 DIAGNOSIS — E119 Type 2 diabetes mellitus without complications: Secondary | ICD-10-CM | POA: Diagnosis not present

## 2020-11-03 DIAGNOSIS — I5032 Chronic diastolic (congestive) heart failure: Secondary | ICD-10-CM | POA: Diagnosis not present

## 2020-11-03 DIAGNOSIS — H1089 Other conjunctivitis: Secondary | ICD-10-CM | POA: Diagnosis not present

## 2020-11-03 DIAGNOSIS — J302 Other seasonal allergic rhinitis: Secondary | ICD-10-CM | POA: Diagnosis not present

## 2020-11-08 DIAGNOSIS — I480 Paroxysmal atrial fibrillation: Secondary | ICD-10-CM | POA: Diagnosis not present

## 2020-11-08 DIAGNOSIS — M6259 Muscle wasting and atrophy, not elsewhere classified, multiple sites: Secondary | ICD-10-CM | POA: Diagnosis not present

## 2020-11-08 DIAGNOSIS — R41841 Cognitive communication deficit: Secondary | ICD-10-CM | POA: Diagnosis not present

## 2020-11-08 DIAGNOSIS — G9341 Metabolic encephalopathy: Secondary | ICD-10-CM | POA: Diagnosis not present

## 2020-11-08 DIAGNOSIS — N139 Obstructive and reflux uropathy, unspecified: Secondary | ICD-10-CM | POA: Diagnosis not present

## 2020-11-08 DIAGNOSIS — Z9181 History of falling: Secondary | ICD-10-CM | POA: Diagnosis not present

## 2020-11-08 DIAGNOSIS — J449 Chronic obstructive pulmonary disease, unspecified: Secondary | ICD-10-CM | POA: Diagnosis not present

## 2020-11-08 DIAGNOSIS — R2689 Other abnormalities of gait and mobility: Secondary | ICD-10-CM | POA: Diagnosis not present

## 2020-11-08 DIAGNOSIS — M6281 Muscle weakness (generalized): Secondary | ICD-10-CM | POA: Diagnosis not present

## 2020-11-08 DIAGNOSIS — E119 Type 2 diabetes mellitus without complications: Secondary | ICD-10-CM | POA: Diagnosis not present

## 2020-11-08 DIAGNOSIS — R1312 Dysphagia, oropharyngeal phase: Secondary | ICD-10-CM | POA: Diagnosis not present

## 2020-11-08 DIAGNOSIS — R262 Difficulty in walking, not elsewhere classified: Secondary | ICD-10-CM | POA: Diagnosis not present

## 2020-11-08 DIAGNOSIS — R2681 Unsteadiness on feet: Secondary | ICD-10-CM | POA: Diagnosis not present

## 2020-11-08 DIAGNOSIS — R278 Other lack of coordination: Secondary | ICD-10-CM | POA: Diagnosis not present

## 2020-11-08 DIAGNOSIS — I639 Cerebral infarction, unspecified: Secondary | ICD-10-CM | POA: Diagnosis not present

## 2020-11-08 DIAGNOSIS — N184 Chronic kidney disease, stage 4 (severe): Secondary | ICD-10-CM | POA: Diagnosis not present

## 2020-11-10 DIAGNOSIS — N471 Phimosis: Secondary | ICD-10-CM | POA: Diagnosis not present

## 2020-11-17 ENCOUNTER — Ambulatory Visit: Payer: Medicare Other | Admitting: Adult Health

## 2020-12-09 DIAGNOSIS — Z9181 History of falling: Secondary | ICD-10-CM | POA: Diagnosis not present

## 2020-12-09 DIAGNOSIS — R41841 Cognitive communication deficit: Secondary | ICD-10-CM | POA: Diagnosis not present

## 2020-12-09 DIAGNOSIS — N184 Chronic kidney disease, stage 4 (severe): Secondary | ICD-10-CM | POA: Diagnosis not present

## 2020-12-09 DIAGNOSIS — E119 Type 2 diabetes mellitus without complications: Secondary | ICD-10-CM | POA: Diagnosis not present

## 2020-12-09 DIAGNOSIS — R2689 Other abnormalities of gait and mobility: Secondary | ICD-10-CM | POA: Diagnosis not present

## 2020-12-09 DIAGNOSIS — R278 Other lack of coordination: Secondary | ICD-10-CM | POA: Diagnosis not present

## 2020-12-09 DIAGNOSIS — I639 Cerebral infarction, unspecified: Secondary | ICD-10-CM | POA: Diagnosis not present

## 2020-12-09 DIAGNOSIS — R1312 Dysphagia, oropharyngeal phase: Secondary | ICD-10-CM | POA: Diagnosis not present

## 2020-12-09 DIAGNOSIS — M6281 Muscle weakness (generalized): Secondary | ICD-10-CM | POA: Diagnosis not present

## 2020-12-09 DIAGNOSIS — R262 Difficulty in walking, not elsewhere classified: Secondary | ICD-10-CM | POA: Diagnosis not present

## 2020-12-09 DIAGNOSIS — G9341 Metabolic encephalopathy: Secondary | ICD-10-CM | POA: Diagnosis not present

## 2020-12-09 DIAGNOSIS — R2681 Unsteadiness on feet: Secondary | ICD-10-CM | POA: Diagnosis not present

## 2020-12-09 DIAGNOSIS — M6259 Muscle wasting and atrophy, not elsewhere classified, multiple sites: Secondary | ICD-10-CM | POA: Diagnosis not present

## 2020-12-09 DIAGNOSIS — N139 Obstructive and reflux uropathy, unspecified: Secondary | ICD-10-CM | POA: Diagnosis not present

## 2020-12-09 DIAGNOSIS — I480 Paroxysmal atrial fibrillation: Secondary | ICD-10-CM | POA: Diagnosis not present

## 2020-12-09 DIAGNOSIS — J449 Chronic obstructive pulmonary disease, unspecified: Secondary | ICD-10-CM | POA: Diagnosis not present

## 2020-12-15 ENCOUNTER — Ambulatory Visit: Payer: Medicare Other | Admitting: Adult Health

## 2020-12-22 DIAGNOSIS — H401134 Primary open-angle glaucoma, bilateral, indeterminate stage: Secondary | ICD-10-CM | POA: Diagnosis not present

## 2020-12-28 ENCOUNTER — Encounter (INDEPENDENT_AMBULATORY_CARE_PROVIDER_SITE_OTHER): Payer: Self-pay

## 2020-12-28 ENCOUNTER — Encounter (INDEPENDENT_AMBULATORY_CARE_PROVIDER_SITE_OTHER): Payer: Medicare Other | Admitting: Ophthalmology

## 2020-12-29 DIAGNOSIS — I2510 Atherosclerotic heart disease of native coronary artery without angina pectoris: Secondary | ICD-10-CM | POA: Diagnosis not present

## 2020-12-29 DIAGNOSIS — I5032 Chronic diastolic (congestive) heart failure: Secondary | ICD-10-CM | POA: Diagnosis not present

## 2020-12-29 DIAGNOSIS — G4733 Obstructive sleep apnea (adult) (pediatric): Secondary | ICD-10-CM | POA: Diagnosis not present

## 2020-12-29 DIAGNOSIS — E0841 Diabetes mellitus due to underlying condition with diabetic mononeuropathy: Secondary | ICD-10-CM | POA: Diagnosis not present

## 2020-12-29 DIAGNOSIS — E662 Morbid (severe) obesity with alveolar hypoventilation: Secondary | ICD-10-CM | POA: Diagnosis not present

## 2020-12-29 DIAGNOSIS — I152 Hypertension secondary to endocrine disorders: Secondary | ICD-10-CM | POA: Diagnosis not present

## 2020-12-29 DIAGNOSIS — I480 Paroxysmal atrial fibrillation: Secondary | ICD-10-CM | POA: Diagnosis not present

## 2020-12-29 DIAGNOSIS — I779 Disorder of arteries and arterioles, unspecified: Secondary | ICD-10-CM | POA: Diagnosis not present

## 2020-12-29 DIAGNOSIS — I639 Cerebral infarction, unspecified: Secondary | ICD-10-CM | POA: Diagnosis not present

## 2020-12-29 DIAGNOSIS — F0390 Unspecified dementia without behavioral disturbance: Secondary | ICD-10-CM | POA: Diagnosis not present

## 2020-12-29 DIAGNOSIS — R609 Edema, unspecified: Secondary | ICD-10-CM | POA: Diagnosis not present

## 2021-01-12 DIAGNOSIS — R278 Other lack of coordination: Secondary | ICD-10-CM | POA: Diagnosis not present

## 2021-01-12 DIAGNOSIS — J449 Chronic obstructive pulmonary disease, unspecified: Secondary | ICD-10-CM | POA: Diagnosis not present

## 2021-01-12 DIAGNOSIS — G9341 Metabolic encephalopathy: Secondary | ICD-10-CM | POA: Diagnosis not present

## 2021-01-12 DIAGNOSIS — Z9181 History of falling: Secondary | ICD-10-CM | POA: Diagnosis not present

## 2021-01-12 DIAGNOSIS — I639 Cerebral infarction, unspecified: Secondary | ICD-10-CM | POA: Diagnosis not present

## 2021-01-12 DIAGNOSIS — N184 Chronic kidney disease, stage 4 (severe): Secondary | ICD-10-CM | POA: Diagnosis not present

## 2021-01-12 DIAGNOSIS — R262 Difficulty in walking, not elsewhere classified: Secondary | ICD-10-CM | POA: Diagnosis not present

## 2021-01-12 DIAGNOSIS — R41841 Cognitive communication deficit: Secondary | ICD-10-CM | POA: Diagnosis not present

## 2021-01-12 DIAGNOSIS — I480 Paroxysmal atrial fibrillation: Secondary | ICD-10-CM | POA: Diagnosis not present

## 2021-01-12 DIAGNOSIS — R2681 Unsteadiness on feet: Secondary | ICD-10-CM | POA: Diagnosis not present

## 2021-01-12 DIAGNOSIS — M6259 Muscle wasting and atrophy, not elsewhere classified, multiple sites: Secondary | ICD-10-CM | POA: Diagnosis not present

## 2021-01-12 DIAGNOSIS — E119 Type 2 diabetes mellitus without complications: Secondary | ICD-10-CM | POA: Diagnosis not present

## 2021-01-12 DIAGNOSIS — M6281 Muscle weakness (generalized): Secondary | ICD-10-CM | POA: Diagnosis not present

## 2021-01-12 DIAGNOSIS — N139 Obstructive and reflux uropathy, unspecified: Secondary | ICD-10-CM | POA: Diagnosis not present

## 2021-01-12 DIAGNOSIS — R2689 Other abnormalities of gait and mobility: Secondary | ICD-10-CM | POA: Diagnosis not present

## 2021-01-12 DIAGNOSIS — R1312 Dysphagia, oropharyngeal phase: Secondary | ICD-10-CM | POA: Diagnosis not present

## 2021-01-22 IMAGING — DX PORTABLE CHEST - 1 VIEW
1 series · 1 of 1 positions shown · non-contrast
Comparison: 11/15/2018

CLINICAL DATA: Hypoxia

EXAM:
PORTABLE CHEST 1 VIEW

[chest]
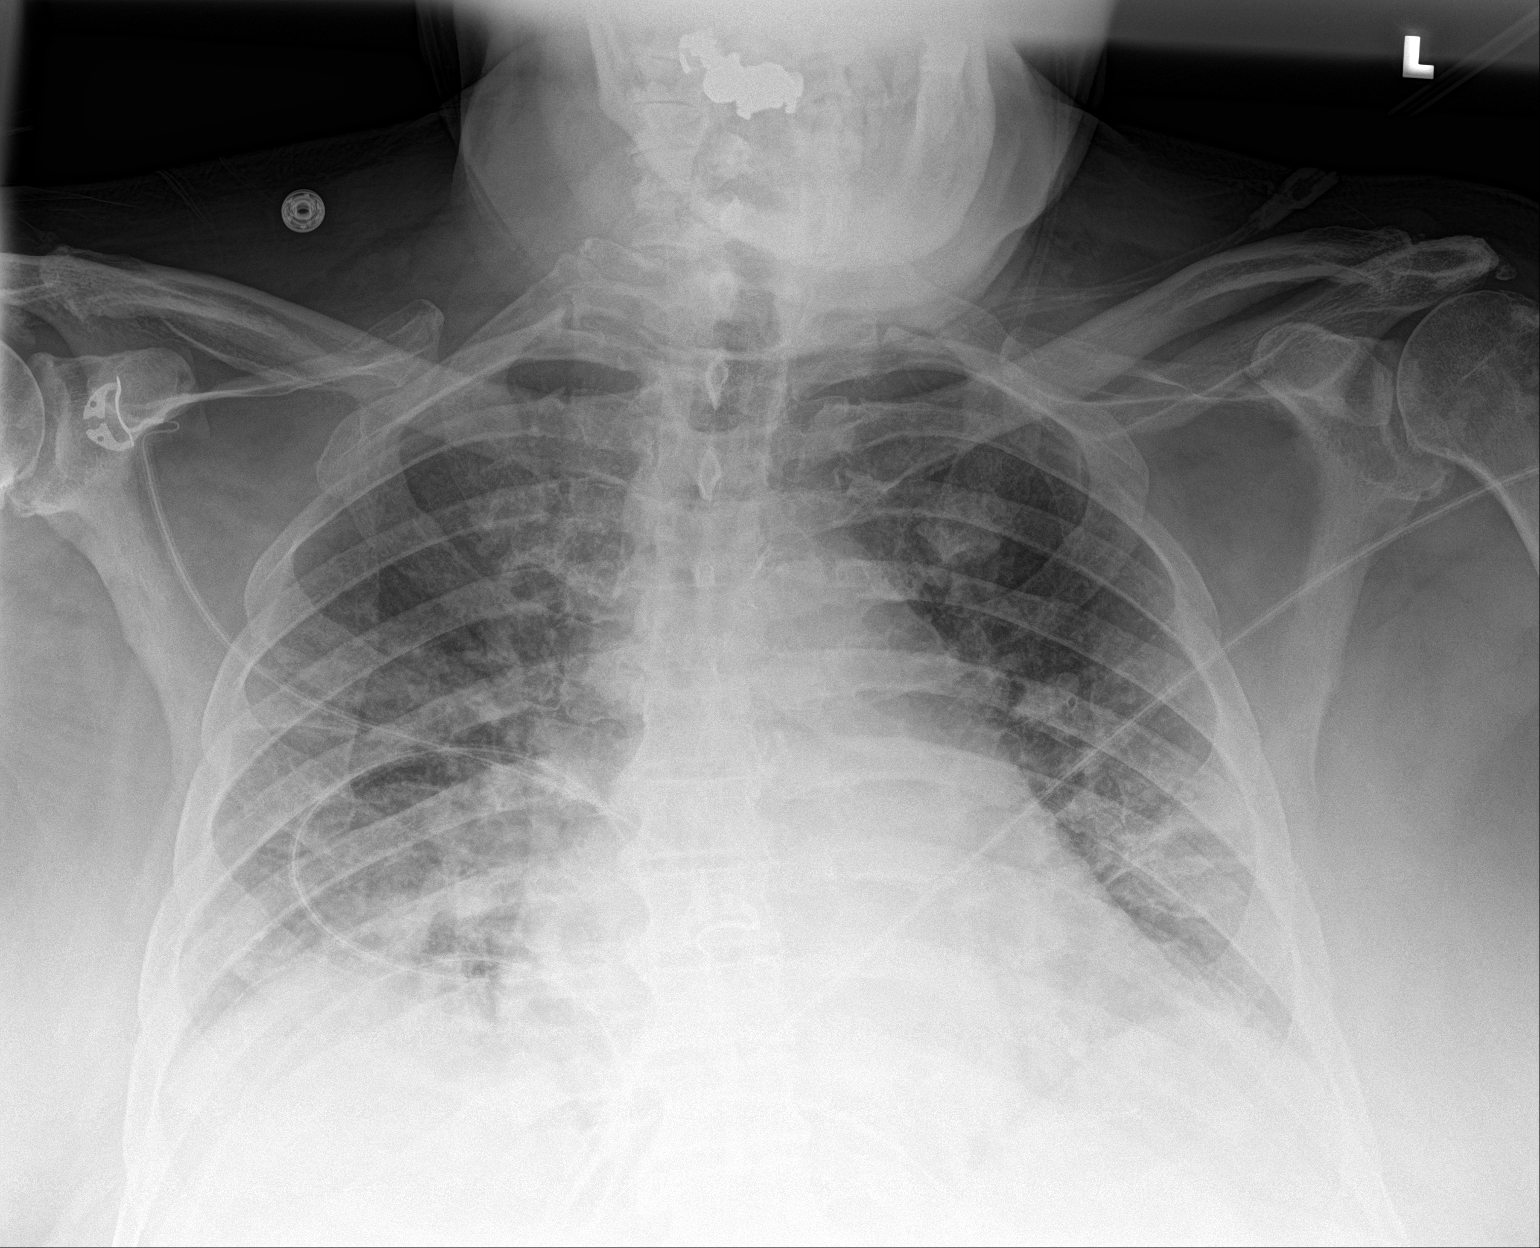

[1 of 1 positions shown; findings below may reference images not displayed]

FINDINGS: Cardiac shadow is stable. Increasing bilateral patchy opacities are
noted when compare with the prior exam. No sizable effusion is seen.
No bony abnormality is noted.
IMPRESSION: Patchy infiltrates slightly increased when compare with the prior
exam.

## 2021-01-26 ENCOUNTER — Encounter (HOSPITAL_COMMUNITY): Payer: Self-pay

## 2021-01-26 ENCOUNTER — Emergency Department (HOSPITAL_COMMUNITY)
Admission: EM | Admit: 2021-01-26 | Discharge: 2021-01-26 | Disposition: A | Discharge status: Home / Self Care | Payer: Medicare Other | Attending: Emergency Medicine | Admitting: Emergency Medicine

## 2021-01-26 ENCOUNTER — Other Ambulatory Visit: Payer: Self-pay

## 2021-01-26 DIAGNOSIS — H53459 Other localized visual field defect, unspecified eye: Secondary | ICD-10-CM | POA: Diagnosis not present

## 2021-01-26 DIAGNOSIS — R531 Weakness: Secondary | ICD-10-CM | POA: Diagnosis not present

## 2021-01-26 DIAGNOSIS — E1169 Type 2 diabetes mellitus with other specified complication: Secondary | ICD-10-CM | POA: Insufficient documentation

## 2021-01-26 DIAGNOSIS — Z7982 Long term (current) use of aspirin: Secondary | ICD-10-CM | POA: Diagnosis not present

## 2021-01-26 DIAGNOSIS — E1140 Type 2 diabetes mellitus with diabetic neuropathy, unspecified: Secondary | ICD-10-CM | POA: Insufficient documentation

## 2021-01-26 DIAGNOSIS — E1139 Type 2 diabetes mellitus with other diabetic ophthalmic complication: Secondary | ICD-10-CM | POA: Insufficient documentation

## 2021-01-26 DIAGNOSIS — J449 Chronic obstructive pulmonary disease, unspecified: Secondary | ICD-10-CM | POA: Insufficient documentation

## 2021-01-26 DIAGNOSIS — Z7902 Long term (current) use of antithrombotics/antiplatelets: Secondary | ICD-10-CM | POA: Insufficient documentation

## 2021-01-26 DIAGNOSIS — Z7984 Long term (current) use of oral hypoglycemic drugs: Secondary | ICD-10-CM | POA: Diagnosis not present

## 2021-01-26 DIAGNOSIS — I509 Heart failure, unspecified: Secondary | ICD-10-CM | POA: Insufficient documentation

## 2021-01-26 DIAGNOSIS — E785 Hyperlipidemia, unspecified: Secondary | ICD-10-CM | POA: Diagnosis not present

## 2021-01-26 DIAGNOSIS — E1151 Type 2 diabetes mellitus with diabetic peripheral angiopathy without gangrene: Secondary | ICD-10-CM | POA: Insufficient documentation

## 2021-01-26 DIAGNOSIS — I130 Hypertensive heart and chronic kidney disease with heart failure and stage 1 through stage 4 chronic kidney disease, or unspecified chronic kidney disease: Secondary | ICD-10-CM | POA: Diagnosis not present

## 2021-01-26 DIAGNOSIS — Z794 Long term (current) use of insulin: Secondary | ICD-10-CM | POA: Insufficient documentation

## 2021-01-26 DIAGNOSIS — N184 Chronic kidney disease, stage 4 (severe): Secondary | ICD-10-CM | POA: Diagnosis not present

## 2021-01-26 DIAGNOSIS — E1122 Type 2 diabetes mellitus with diabetic chronic kidney disease: Secondary | ICD-10-CM | POA: Diagnosis not present

## 2021-01-26 DIAGNOSIS — R202 Paresthesia of skin: Secondary | ICD-10-CM | POA: Diagnosis not present

## 2021-01-26 DIAGNOSIS — Z955 Presence of coronary angioplasty implant and graft: Secondary | ICD-10-CM | POA: Diagnosis not present

## 2021-01-26 DIAGNOSIS — I2510 Atherosclerotic heart disease of native coronary artery without angina pectoris: Secondary | ICD-10-CM | POA: Diagnosis not present

## 2021-01-26 DIAGNOSIS — Z8616 Personal history of COVID-19: Secondary | ICD-10-CM | POA: Diagnosis not present

## 2021-01-26 DIAGNOSIS — I10 Essential (primary) hypertension: Secondary | ICD-10-CM | POA: Diagnosis not present

## 2021-01-26 DIAGNOSIS — Z743 Need for continuous supervision: Secondary | ICD-10-CM | POA: Diagnosis not present

## 2021-01-26 DIAGNOSIS — I480 Paroxysmal atrial fibrillation: Secondary | ICD-10-CM | POA: Diagnosis not present

## 2021-01-26 DIAGNOSIS — I959 Hypotension, unspecified: Secondary | ICD-10-CM | POA: Diagnosis not present

## 2021-01-26 DIAGNOSIS — I6389 Other cerebral infarction: Secondary | ICD-10-CM | POA: Diagnosis not present

## 2021-01-26 LAB — BASIC METABOLIC PANEL
Anion gap: 9 (ref 5–15)
BUN: 16 mg/dL (ref 8–23)
CO2: 26 mmol/L (ref 22–32)
Calcium: 9 mg/dL (ref 8.9–10.3)
Chloride: 105 mmol/L (ref 98–111)
Creatinine, Ser: 1.31 mg/dL — ABNORMAL HIGH (ref 0.61–1.24)
GFR, Estimated: 54 mL/min — ABNORMAL LOW (ref >60)
Glucose, Bld: 103 mg/dL — ABNORMAL HIGH (ref 70–99)
Potassium: 3.8 mmol/L (ref 3.5–5.1)
Sodium: 140 mmol/L (ref 135–145)

## 2021-01-26 LAB — CBG MONITORING, ED: Glucose-Capillary: 110 mg/dL — ABNORMAL HIGH (ref 70–99)

## 2021-01-26 LAB — TSH: TSH: 2.748 u[IU]/mL (ref 0.350–4.500)

## 2021-01-26 LAB — MAGNESIUM: Magnesium: 1.9 mg/dL (ref 1.7–2.4)

## 2021-01-26 NOTE — ED Triage Notes (Signed)
EMS reports from [facility]. Pt c/o bilateral foot numbness since this morning. Hx of diabetic neuropathy. EMS states no other neurological symptoms and Pt has no other complaints.  BP 140 HR 52 RR 18 Sp02 96 RA CBG 115

## 2021-01-26 NOTE — ED Notes (Signed)
Attempted to call [place] no answer

## 2021-01-26 NOTE — ED Provider Notes (Signed)
[facility] DEPT Provider Note   CSN: [id number] Arrival date & time: 01/26/21  1252     History Chief Complaint  Patient presents with   Numbness    Bilateral feet    Reginald [name] is a 85 y.o. male.  The history is provided by the patient.  Neurologic Problem This is a chronic problem. The current episode started 3 to 5 hours ago. The problem occurs constantly. The problem has not changed since onset.Pertinent negatives include no chest pain, no abdominal pain, no headaches and no shortness of breath. Nothing aggravates the symptoms. Nothing relieves the symptoms.   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  The patient denies any facial droop, new weakness, dysarthria, new vision changes.  He denies any headaches, fevers or chills.  He states that he has had bilateral subjective numbness in his lower extremities below the level of the knee with no weakness.  Symptoms are presented for the past few hours.  The patient presented from his SNF due to this concern.  He is primarily bedbound with left hemiparesis after a right PLIC infarct sustained on 08/05/2020.  Past Medical History:  Diagnosis Date   Anteroseptal myocardial infarction [facility])    Carotid artery occlusion    CHF (congestive heart failure) (HCC)    Chronic kidney disease    COPD (chronic obstructive pulmonary disease) (HCC)    Coronary atherosclerosis of unspecified type of vessel, native or graft    a. s/p prior PCI to LAD in 1997   Diverticulosis    ED (erectile dysfunction)    Foley catheter in place    Glaucoma    Hematuria    History of 2019 novel coronavirus disease (COVID-19) 11/2018   Hydronephrosis, bilateral    Hydroureter    Hypercholesteremia     Hypertensive renal disease    Hypogonadism male    Hypokalemia    Obesity hypoventilation syndrome (HCC)    Obesity, unspecified    OSA (obstructive sleep apnea)    Peripheral vascular disease ([place])    Permanent atrial fibrillation ([place])    Phimosis    Pneumonia    Rhabdomyolysis    Tremor    Type II or unspecified type diabetes mellitus without mention of complication, not stated as uncontrolled    Unspecified essential hypertension    Urinary incontinence    Urinary retention    Vestibulopathy     Patient Active Problem List   Diagnosis Date Noted   Stroke ([place]) 08/05/2020   Vision loss of right eye 06/09/2020   Acute on chronic renal failure ([place]) 12/19/2018   Acute renal failure superimposed on stage 4 chronic kidney disease ([place]) 12/19/2018   AKI (acute kidney injury) ([place]) 11/26/2018   HCAP (healthcare-associated pneumonia) 11/15/2018   COVID-19 11/15/2018   Chronic venous insufficiency 10/04/2017   Venous stasis ulcers of both lower extremities ([place]) 10/04/2017   Nonspecific chest pain 10/27/2016   Morbid obesity ([place]) 05/02/2016   Lymphedema 05/02/2016   Abnormal laboratory test result 10/15/2015   Cough 10/08/2015   Hematuria 09/21/2015   Hypokalemia 08/13/2015   HTN (hypertension) 08/13/2015   Multiple open wounds of lower leg 07/22/2015   CKD (chronic kidney disease) stage 4, GFR 15-29 ml/min ([place]) 07/22/2015   Cellulitis of leg, right 06/28/2015   ARF (acute renal failure) (  [place]) 06/28/2015   Hypertensive heart disease with CHF (congestive heart failure) ([place]) 05/19/2015   CAD (coronary artery disease), native coronary artery 05/19/2015   Hyperlipidemia 05/19/2015   Vitamin D deficiency 05/19/2015   Pressure ulcer 63/87/5643   Diastolic dysfunction with chronic heart failure ([place]) 05/08/2015   Blisters of multiple sites 05/08/2015   Type 2 diabetes mellitus ([place])    Obesity, unspecified    OSA (obstructive sleep apnea)    Atrial fibrillation ([place])     Secondary DM with CKD stage 4 and hypertension ([place])    Glaucoma    Cellulitis 05/07/2015    Past Surgical History:  Procedure Laterality Date   APPENDECTOMY  1985   CARDIAC CATHETERIZATION     CHOLECYSTECTOMY  06/2000   CIRCUMCISION N/A 05/28/2020   Procedure: CIRCUMCISION ADULT;  Surgeon: Franchot Gallo, MD;  Location: [facility] ORS;  Service: Urology;  Laterality: N/A;   CORONARY ANGIOPLASTY  08/14/1995   stent placement to LAD    DORSAL SLIT N/A 05/28/2020   Procedure: DORSAL SLIT;  Surgeon: Franchot Gallo, MD;  Location: [facility];  Service: Urology;  Laterality: N/A;  53 MINS   ENDOVENOUS ABLATION SAPHENOUS VEIN W/ LASER Left 02/21/2018   endovenous laser ablation L GSV by Ruta Hinds MD    INGUINAL HERNIA REPAIR Right 1985   KNEE ARTHROSCOPY Left 1991   LUMBAR FUSION     NOSE SURGERY  1970s   Per Dr. Terrence [facility] in pt chart   TRANSCAROTID ARTERY REVASCULARIZATION  Right 06/12/2020   Procedure: RIGHT TRANSCAROTID ARTERY REVASCULARIZATION;  Surgeon: Serafina Mitchell, MD;  Location: [facility];  Service: Vascular;  Laterality: Right;       Family History  Problem Relation Age of Onset   COPD Mother    Heart disease Mother    Diabetes Father    Heart disease Father    Diabetes Sister    Heart disease Brother    Heart disease Brother    Breast cancer Maternal Aunt    Diabetes Paternal Grandmother    Colon cancer Maternal Aunt    Ovarian cancer Daughter    Glaucoma Other     Social History   Tobacco Use   Smoking status: Never   Smokeless tobacco: Never  Vaping Use   Vaping Use: Never used  Substance Use Topics   Alcohol use: No    Alcohol/week: 0.0 standard drinks   Drug use: No    Home Medications Prior to Admission medications   Medication Sig Start Date End Date Taking? Authorizing Provider  acetaminophen (TYLENOL) 325 MG tablet Take 650 mg by mouth every 4 (four) hours as needed (for pain).   Yes [provider]  albuterol (VENTOLIN HFA) 108  (90 Base) MCG/ACT inhaler Inhale 2 puffs into the lungs 2 (two) times daily as needed for wheezing or shortness of breath.   Yes [provider]  apixaban (ELIQUIS) 5 MG TABS tablet Take 1 tablet (5 mg total) by mouth 2 (two) times daily. 08/07/20  Yes Anderson, Chelsey L, DO  aspirin EC 81 MG EC tablet Take 1 tablet (81 mg total) by mouth daily. Swallow whole. 06/13/20  Yes Hosie Poisson, MD  atorvastatin (LIPITOR) 40 MG tablet Take 40 mg by mouth at bedtime.   Yes [provider]  brimonidine-timolol (COMBIGAN) 0.2-0.5 % ophthalmic solution Place 1 drop into both eyes every 12 (twelve) hours.   Yes [provider]  Cholecalciferol (VITAMIN D-3) 25 MCG (1000 UT) CAPS Take 2,000 Units by  mouth daily.   Yes [provider]  Dextran 70-Hypromellose (ARTIFICIAL TEARS PF OP) Place 1 drop into both eyes 4 (four) times daily.   Yes [provider]  Dulaglutide (TRULICITY) 3 NW/2.9FA SOPN Inject 3 mg into the skin every Monday. Mondays   Yes [provider]  fexofenadine (ALLEGRA) 180 MG tablet Take 180 mg by mouth daily.   Yes [provider]  gabapentin (NEURONTIN) 100 MG capsule Take 2 capsules (200 mg total) by mouth at bedtime as needed. Patient taking differently: Take 200 mg by mouth at bedtime. 08/07/20  Yes Anderson, Chelsey L, DO  latanoprost (XALATAN) 0.005 % ophthalmic solution Place 1 drop into both eyes at bedtime. 01/03/17  Yes [provider]  loperamide (IMODIUM A-D) 2 MG tablet Take 2 mg by mouth See admin instructions. Take 2 mg by mouth as needed after each loose stool- max of 2 tablets/day   Yes [provider]  losartan (COZAAR) 25 MG tablet Take 25 mg by mouth daily.   Yes [provider]  magnesium oxide (MAG-OX) 400 MG tablet Take 400 mg by mouth daily.   Yes [provider]  Melatonin 5 MG TABS Take 5 mg by mouth at bedtime.   Yes [provider]  Menthol, Topical Analgesic,  (BIOFREEZE) 4 % GEL Apply 1 application topically 2 (two) times daily as needed (to affected areas- forehead and left arm).   Yes [provider]  metoprolol succinate (TOPROL XL) 25 MG 24 hr tablet Take 0.5 tablets (12.5 mg total) by mouth daily. 06/14/20 06/14/21 Yes Hosie Poisson, MD  nitroGLYCERIN (NITROSTAT) 0.4 MG SL tablet Place 0.4 mg under the tongue every 5 (five) minutes x 3 doses as needed for chest pain (and CALL PROVIDER IF NO RELIEF AFTER THE 3RD DOSE).   Yes [provider]  oxybutynin (DITROPAN) 5 MG tablet Take 5 mg by mouth every 8 (eight) hours as needed for bladder spasms.  05/07/20  Yes [provider]  polyethylene glycol (MIRALAX / GLYCOLAX) 17 g packet Take 17 g by mouth See admin instructions. Mix 17 grams of powder into 6 oz fluid and drink once a day   Yes [provider]  repaglinide (PRANDIN) 0.5 MG tablet Take 0.5 mg by mouth daily. 08/03/20  Yes [provider]  sennosides-docusate sodium (SENOKOT-S) 8.6-50 MG tablet Take 2 tablets by mouth 2 (two) times daily.   Yes [provider]  Skin Protectants, Misc. (EUCERIN) cream Apply 1 application topically See admin instructions. Apply a thin layer to affected areas of the skin 2 times a day for itching   Yes [provider]  sodium chloride (OCEAN) 0.65 % SOLN nasal spray Place 1 spray into both nostrils 3 (three) times daily. Place 1 spray into both nostrils three times a day and an additional 1 spray every two hours as needed for for dryness   Yes [provider]  tamsulosin (FLOMAX) 0.4 MG CAPS capsule Take 1 capsule (0.4 mg total) by mouth daily after supper. 12/25/18  Yes Hall, Carole N, DO  VASCEPA 1 g capsule Take 2 g by mouth every evening.   Yes [provider]  vitamin B-12 (CYANOCOBALAMIN) 1000 MCG tablet Take 1,000 mcg by mouth daily.   Yes [provider]  cetirizine (ZYRTEC) 10 MG tablet Take 5 mg by mouth at bedtime.  Patient  not taking: Reported on 01/26/2021    [provider]  insulin lispro (HUMALOG) 100 UNIT/ML injection Inject 0-14  Units into the skin See admin instructions. Inject 0-14 units subcutaneously twice daily per sliding scale: CBG 0-200 0 units, 201-250 2 units, 251-300 4 units, 301-350 6 units, 351-400 8 units, 401-450 10 units, 451-500 12 units, >500 14 units If blood sugar is > 400 or < 80 call pec triage Patient not taking: Reported on 01/26/2021    [provider]    Allergies    Adhesive [tape]  Review of Systems   Review of Systems  Constitutional:  Negative for chills and fever.  HENT:  Negative for ear pain and sore throat.   Eyes:  Negative for pain and visual disturbance.  Respiratory:  Negative for cough and shortness of breath.   Cardiovascular:  Negative for chest pain and palpitations.  Gastrointestinal:  Negative for abdominal pain and vomiting.  Genitourinary:  Negative for dysuria and hematuria.  Musculoskeletal:  Negative for arthralgias and back pain.  Skin:  Negative for color change and rash.  Neurological:  Positive for weakness and numbness. Negative for seizures, syncope, facial asymmetry, speech difficulty and headaches.  All other systems reviewed and are negative.  Physical Exam Updated Vital Signs BP (!) 160/90   Pulse 62   Temp 98.1 F (36.7 C) (Oral)   Resp 16   SpO2 98%   Physical Exam Vitals and nursing note reviewed.  Constitutional:      Appearance: He is well-developed.  HENT:     Head: Normocephalic and atraumatic.  Eyes:     General: Visual field deficit present.     Conjunctiva/sclera: Conjunctivae normal.  Cardiovascular:     Rate and Rhythm: Normal rate and regular rhythm.     Heart sounds: No murmur heard. Pulmonary:     Effort: Pulmonary effort is normal. No respiratory distress.     Breath sounds: Normal breath sounds.  Abdominal:     Palpations: Abdomen is soft.     Tenderness: There is no abdominal tenderness.   Musculoskeletal:     Cervical back: Neck supple.  Skin:    General: Skin is warm and dry.  Neurological:     Mental Status: He is alert. Mental status is at baseline.     GCS: GCS eye subscore is 4. GCS verbal subscore is 5. GCS motor subscore is 6.     Cranial Nerves: No dysarthria or facial asymmetry.     Sensory: Sensation is intact.     Motor: Weakness present.     Coordination: Coordination is intact.     Comments: Decreased vision in the right eye, left eye visual fields intact.  Sensation intact bilaterally in the lower extremities, 3 out of 5 strength in the left lower extremity, 4-5 strength in the left upper extremity, at baseline.  5 out of 5 strength in the right hemibody.  Gait not assessed.    ED Results / Procedures / Treatments   Labs (all labs ordered are listed, but only abnormal results are displayed) Labs Reviewed  BASIC METABOLIC PANEL - Abnormal; Notable for the following components:      Result Value   Glucose, Bld 103 (*)    Creatinine, Ser 1.31 (*)    GFR, Estimated 54 (*)    All other components within normal limits  CBG MONITORING, ED - Abnormal; Notable for the following components:   Glucose-Capillary 110 (*)    All other components within normal limits  MAGNESIUM  TSH    EKG None  Radiology No results found.  Procedures Procedures   Medications  Ordered in ED Medications - No data to display  ED Course  I have reviewed the triage vital signs and the nursing notes.  Pertinent labs & imaging results that were available during my care of the patient were reviewed by me and considered in my medical decision making (see chart for details).    MDM Rules/Calculators/A&P                          85 year old male with a history of prior CVA and residual left-sided hemibody deficits presenting to the emergency department with concern for new onset bilateral lower extremity subjective numbness.  On physical exam, the patient neurologic exam  appears to be at baseline.  No concern for acute CVA at this time. Bilateral subjective numbness in the patients feet. Symptoms are consistent with diabetic neuropathy.  Screening labs undertaken and overall unremarkable.  Creatinine appears at baseline.  No electrolyte abnormality.  Overall feel that patient is stable for discharge back to his facility. Final Clinical Impression(s) / ED Diagnoses Final diagnoses:  Paresthesias    Rx / DC Orders ED Discharge Orders     None        Regan Lemming, MD 01/27/21 1815

## 2021-01-26 NOTE — ED Notes (Signed)
PTAR states pt is the 9th person in line to pick up. Ptar was called earlier and a time frame was not available at that time.

## 2021-01-26 NOTE — Discharge Instructions (Addendum)
Please follow-up with your PCP and your neurologist for your regularly scheduled appointments. We have no concern for acute stroke today.

## 2021-01-26 NOTE — ED Notes (Signed)
PTAR called. No time of arrival given

## 2021-01-27 DIAGNOSIS — Z9889 Other specified postprocedural states: Secondary | ICD-10-CM | POA: Diagnosis not present

## 2021-01-27 DIAGNOSIS — E119 Type 2 diabetes mellitus without complications: Secondary | ICD-10-CM | POA: Diagnosis not present

## 2021-01-27 DIAGNOSIS — R200 Anesthesia of skin: Secondary | ICD-10-CM | POA: Diagnosis not present

## 2021-01-27 DIAGNOSIS — E0841 Diabetes mellitus due to underlying condition with diabetic mononeuropathy: Secondary | ICD-10-CM | POA: Diagnosis not present

## 2021-01-28 DIAGNOSIS — Z9189 Other specified personal risk factors, not elsewhere classified: Secondary | ICD-10-CM | POA: Diagnosis not present

## 2021-01-28 DIAGNOSIS — H539 Unspecified visual disturbance: Secondary | ICD-10-CM | POA: Diagnosis not present

## 2021-01-28 DIAGNOSIS — K5901 Slow transit constipation: Secondary | ICD-10-CM | POA: Diagnosis not present

## 2021-01-28 DIAGNOSIS — J302 Other seasonal allergic rhinitis: Secondary | ICD-10-CM | POA: Diagnosis not present

## 2021-01-28 DIAGNOSIS — D519 Vitamin B12 deficiency anemia, unspecified: Secondary | ICD-10-CM | POA: Diagnosis not present

## 2021-01-28 DIAGNOSIS — Z789 Other specified health status: Secondary | ICD-10-CM | POA: Diagnosis not present

## 2021-02-03 DIAGNOSIS — H109 Unspecified conjunctivitis: Secondary | ICD-10-CM | POA: Diagnosis not present

## 2021-02-03 DIAGNOSIS — R6889 Other general symptoms and signs: Secondary | ICD-10-CM | POA: Diagnosis not present

## 2021-02-03 DIAGNOSIS — E538 Deficiency of other specified B group vitamins: Secondary | ICD-10-CM | POA: Diagnosis not present

## 2021-02-03 DIAGNOSIS — J302 Other seasonal allergic rhinitis: Secondary | ICD-10-CM | POA: Diagnosis not present

## 2021-02-17 ENCOUNTER — Encounter: Payer: Self-pay | Admitting: Adult Health

## 2021-02-17 ENCOUNTER — Ambulatory Visit (INDEPENDENT_AMBULATORY_CARE_PROVIDER_SITE_OTHER): Payer: Medicare Other | Admitting: Adult Health

## 2021-02-17 VITALS — BP 129/66 | HR 73 | Ht 71.0 in

## 2021-02-17 DIAGNOSIS — I4821 Permanent atrial fibrillation: Secondary | ICD-10-CM | POA: Diagnosis not present

## 2021-02-17 DIAGNOSIS — G8114 Spastic hemiplegia affecting left nondominant side: Secondary | ICD-10-CM

## 2021-02-17 DIAGNOSIS — H5461 Unqualified visual loss, right eye, normal vision left eye: Secondary | ICD-10-CM | POA: Diagnosis not present

## 2021-02-17 DIAGNOSIS — H3411 Central retinal artery occlusion, right eye: Secondary | ICD-10-CM | POA: Diagnosis not present

## 2021-02-17 DIAGNOSIS — I63239 Cerebral infarction due to unspecified occlusion or stenosis of unspecified carotid arteries: Secondary | ICD-10-CM

## 2021-02-17 DIAGNOSIS — Z8673 Personal history of transient ischemic attack (TIA), and cerebral infarction without residual deficits: Secondary | ICD-10-CM | POA: Diagnosis not present

## 2021-02-17 NOTE — Progress Notes (Signed)
[facility] [address] [place]. [place] [address] [phone]       STROKE FOLLOW UP NOTE  [name] Date of Birth:  [birth year] Medical Record Number:  [id number]   Reason for Referral: stroke follow up    SUBJECTIVE:   CHIEF COMPLAINT:  Chief Complaint  Patient presents with   Stroke    Rm 2, 3 month FU, resides at [facility], caregiver Billie Ruddy     HPI:   Today, 02/17/2021, Reginald [name] returns for overdue stroke follow-up.  Long-term resident of [place], accompanied by facility nurse [facility].  Residual left hemiparesis.  He reports working with therapies but per [facility], she does not see him working with them very often.  He is chronically not and uses stand frame to transfer.   Chronic visual impairment stable.  Per MAR review, continues on Eliquis and aspirin as well as atorvastatin.  Blood pressure today 129/66.  Glucose levels monitored - usually lower 100s per report. Routine lab work at facility but unable to personally review. Denies new stroke/TIA symptoms although was evaluated at [facility] ED on 7/19 for c/o bilateral lower extremity weakness consistent with diabetic neuropathy.  No new concerns at this time.    History provided for reference purposes only Update 09/08/2020 JM: Reginald [name] returns for sooner scheduled visit due to recent hospitalization.  He is accompanied by SNF CNA Anna.  He presented to [facility] ED on 08/05/2020 for left-sided arm weakness and left facial droop with stroke work-up revealing [name] infarct likely secondary to small vessel disease source.  Recommended continuation of aspirin and Eliquis for secondary stroke prevention and known atrial fibrillation.  Found to be COVID + during admission (also COVID + 11/2018) although currently asymptomatic.  A1c 7.4.  LDL 45.  Evaluated by therapies and recommended discharge back to SNF.  Stroke:  right [name] infarct, likely secondary to small vessel disease source Resultant left  hemiparesis CT head no acute abnormality MRI right [name] small infarct MRA no LVO.  Moderate stenosis at left paraclinoid ICA and left proximal M1.  Short segment severe stenosis at the distal P2/P3.  Overall appearance is similar compared to prior CTA on 06/09/2020.  Diffuse small vessel atherosclerosis distal MCA and PCA branches. Carotid Doppler 07/13/2020 - no significant stenosis bilaterally 2D Echo EF 55%, last TTE 06/2020 EF 55 to 60%. LDL 45 HgbA1c 7.4 Eliquis for VTE prophylaxis aspirin 81 mg daily and Eliquis (apixaban) daily prior to admission, now on aspirin 81 mg daily and Eliquis (apixaban) daily.  Given improved creatinine, consult pharmacy for appropriate dosing Ongoing aggressive stroke risk factor management Therapy recommendations:  SNF Disposition: SNF  Since discharge, he reports residual left-sided weakness and numbness but has been slowly improving currently working with therapies.  Chronic right eye visual loss stable without worsening He is nonambulatory at baseline and transfers via wheelchair Denies new stroke/TIA symptoms  Remains on aspirin and Eliquis -denies side effects Remains on atorvastatin 20 mg daily -denies side effects Blood pressure today 139/69 Glucose levels have been stable per report  No concerns at this time   Hospital f/u visit 07/20/2020 JM: Reginald [name] is being seen for hospital follow-up unaccompanied.  He continues to reside at [facility] and rehab.  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  He has since been evaluated by ophthalmology since discharge for cataract evaluation and plans on continuing monitoring at this time due to recent stroke.  He plans on  continue to follow with established ophthalmologist Dr. Gershon Crane in the near future.  Denies new stroke/TIA symptoms.  He remains wheelchair-bound over the past 2 years after knee injury per patient and has continued to reside at [facility] and  rehab since that time.  Remains on aspirin, Plavix and Eliquis for secondary stroke prevention without side effects.  Recently seen by [facility] who advised to discontinue Plavix but per review of facility MAR, Plavix has been continued.  Carotid duplex showed no residual right ICA stenosis and plans on repeating duplex 9 months post procedure.  Remains on atorvastatin without myalgias.  Blood pressure today initially elevated but on recheck 140/82 -typically 120s-130s/80s at facility.  Glucose levels range 150-220 at facility.  Endorses nightly compliance with CPAP for OSA management.  No further concerns at this time.  Stroke admission 06/09/2020 Reginald [name] is a 85 y.o. male with history of COPD, coronary artery disease, hypertension, hyperlipidemia, obstructive sleep apnea, diabetes, congestive heart failure, CKD, atrial fibrillation on apixaban, who presented on 06/09/2020 with sudden onset of painless vision loss in the right eye Sunday afternoon.   Personally reviewed hospitalization pertinent progress notes, lab work and imaging with summary provided.  Evaluated by Dr. Erlinda Hong with stroke work-up revealing R CRAO in setting of symptomatic R ICA stenosis and known AF on Eliquis.  MRI did show possible punctate subacute R precentral gyrus infarct involves old R occipital infarct.  CTA head/neck showed right ICA 70% stenosis and moderate to severe B VA stenosis.  Evaluated by Dr. Trula Slade and underwent TCAR and placed on DAPT with outpatient follow-up advised.  History of chronic A. fib with Eliquis held for TCAR procedure and restarted at discharge.  History of HTN stable during admission.  History of HLD with LDL 51 and continued home dose atorvastatin 40 mg daily.  Uncontrolled DM with A1c 8.1.  Other stroke risk factors include advanced age, morbid obesity, CAD, OSA on CPAP, CHF, PVD and prior strokes on imaging.  Other active problems include COPD, CKD and chronic venous insufficiency.  Evaluated by  therapies and recommended discharge to SNF (originally from nursing home w/c bound past 2 years).  R CRAO in setting of symptomatic R ICA stenosis and known AF on Eliquis MRI brain and orbits Probably punctate subacute R precentral gyrus infarct. 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 Moderate small vessel disease. Possible high L frontoparietal scalp lesion. CT head no acute abnormality. 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 Small vessel disease. Atrophy. R posterior falx possible meningioma. 2.1cm L parietal scalp lesion. CTA head no LVO. Intracranial atherosclerosis w/ multifocal stenoses:  Moderate paraclinoid L ICA, moderate proximal L M1, high-grade R A4, high-grade R P2, moderate / severe L P2/3 jxn   CTA neck R ICA 70% soft and calcified plaque. Moderate to severe B VA origin stenoses. 2D Echo EF 55-60%. MV myxomatous.  LDL 51 HgbA1c 8.1 VTE prophylaxis - SCDs   Eliquis (apixaban) daily prior to admission, now on aspirin 81 mg daily and clopidogrel 75 mg daily following plavix load. (Eliquis stopped for TCAR)  Therapy recommendations:  SNF Disposition:  Return SNF (from nsg home, w/c bound past 2 yrs)      ROS:   14 system review of systems performed and negative with exception of those listed in HPI  PMH:  Past Medical History:  Diagnosis Date   Anteroseptal myocardial infarction [facility])    Carotid artery occlusion    CHF (congestive heart failure) ([place])    Chronic kidney disease  COPD (chronic obstructive pulmonary disease) (HCC)    Coronary atherosclerosis of unspecified type of vessel, native or graft    a. s/p prior PCI to LAD in 1997   Diverticulosis    ED (erectile dysfunction)    Foley catheter in place    Glaucoma    Hematuria    History of 2019 novel coronavirus disease (COVID-19) 11/2018   Hydronephrosis, bilateral    Hydroureter    Hypercholesteremia    Hypertensive renal disease    Hypogonadism male    Hypokalemia    Obesity hypoventilation syndrome (HCC)    Obesity,  unspecified    OSA (obstructive sleep apnea)    Peripheral vascular disease (HCC)    Permanent atrial fibrillation ([place])    Phimosis    Pneumonia    Rhabdomyolysis    Stroke ([place])    Tremor    Type II or unspecified type diabetes mellitus without mention of complication, not stated as uncontrolled    Unspecified essential hypertension    Urinary incontinence    Urinary retention    Vestibulopathy     PSH:  Past Surgical History:  Procedure Laterality Date   APPENDECTOMY  1985   CARDIAC CATHETERIZATION     CHOLECYSTECTOMY  06/2000   CIRCUMCISION N/A 05/28/2020   Procedure: CIRCUMCISION ADULT;  Surgeon: Franchot Gallo, MD;  Location: [facility] ORS;  Service: Urology;  Laterality: N/A;   CORONARY ANGIOPLASTY  08/14/1995   stent placement to LAD    DORSAL SLIT N/A 05/28/2020   Procedure: DORSAL SLIT;  Surgeon: Franchot Gallo, MD;  Location: [facility] ORS;  Service: Urology;  Laterality: N/A;  52 MINS   ENDOVENOUS ABLATION SAPHENOUS VEIN W/ LASER Left 02/21/2018   endovenous laser ablation L GSV by Ruta Hinds MD    INGUINAL HERNIA REPAIR Right 1985   KNEE ARTHROSCOPY Left 1991   LUMBAR FUSION     NOSE SURGERY  1970s   Per Dr. Terrence Dupont [facility] in pt chart   TRANSCAROTID ARTERY REVASCULARIZATION  Right 06/12/2020   Procedure: RIGHT TRANSCAROTID ARTERY REVASCULARIZATION;  Surgeon: Serafina Mitchell, MD;  Location: [facility] OR;  Service: Vascular;  Laterality: Right;    Social History:  Social History   Socioeconomic History   Marital status: Single    Spouse name: Not on file   Number of children: 5   Years of education: Not on file   Highest education level: Not on file  Occupational History   Occupation: retired  Tobacco Use   Smoking status: Never   Smokeless tobacco: Never  Vaping Use   Vaping Use: Never used  Substance and Sexual Activity   Alcohol use: No    Alcohol/week: 0.0 standard drinks   Drug use: No   Sexual activity: Never  Other Topics Concern   Not on file   Social History Narrative   02/17/21 lives at [facility] SNF   Social Determinants of Health   Financial Resource Strain: Not on file  Food Insecurity: Not on file  Transportation Needs: Not on file  Physical Activity: Not on file  Stress: Not on file  Social Connections: Not on file  Intimate Partner Violence: Not on file    Family History:  Family History  Problem Relation Age of Onset   COPD Mother    Heart disease Mother    Diabetes Father    Heart disease Father    Diabetes Sister    Heart disease Brother    Heart disease Brother    Breast cancer  Maternal Aunt    Diabetes Paternal Grandmother    Colon cancer Maternal Aunt    Ovarian cancer Daughter    Glaucoma Other     Medications:   Current Outpatient Medications on File Prior to Visit  Medication Sig Dispense Refill   acetaminophen (TYLENOL) 325 MG tablet Take 650 mg by mouth every 4 (four) hours as needed (for pain).     albuterol (VENTOLIN HFA) 108 (90 Base) MCG/ACT inhaler Inhale 2 puffs into the lungs 2 (two) times daily as needed for wheezing or shortness of breath.     apixaban (ELIQUIS) 5 MG TABS tablet Take 1 tablet (5 mg total) by mouth 2 (two) times daily. 60 tablet 0   aspirin EC 81 MG EC tablet Take 1 tablet (81 mg total) by mouth daily. Swallow whole. 30 tablet 11   atorvastatin (LIPITOR) 40 MG tablet Take 40 mg by mouth at bedtime.     brimonidine-timolol (COMBIGAN) 0.2-0.5 % ophthalmic solution Place 1 drop into both eyes every 12 (twelve) hours.     Cholecalciferol (VITAMIN D-3) 25 MCG (1000 UT) CAPS Take 2,000 Units by mouth daily.     Dextran 70-Hypromellose (ARTIFICIAL TEARS PF OP) Place 1 drop into both eyes 4 (four) times daily.     Dulaglutide (TRULICITY) 3 DQ/2.2WL SOPN Inject 3 mg into the skin every Monday. Mondays     fexofenadine (ALLEGRA) 180 MG tablet Take 180 mg by mouth daily.     gabapentin (NEURONTIN) 100 MG capsule Take 2 capsules (200 mg total) by mouth at bedtime as needed.  (Patient taking differently: Take 200 mg by mouth at bedtime.)     latanoprost (XALATAN) 0.005 % ophthalmic solution Place 1 drop into both eyes at bedtime.     losartan (COZAAR) 25 MG tablet Take 25 mg by mouth daily.     magnesium oxide (MAG-OX) 400 MG tablet Take 400 mg by mouth daily.     Melatonin 5 MG TABS Take 5 mg by mouth at bedtime.     metoprolol succinate (TOPROL XL) 25 MG 24 hr tablet Take 0.5 tablets (12.5 mg total) by mouth daily. 15 tablet 11   nitroGLYCERIN (NITROSTAT) 0.4 MG SL tablet Place 0.4 mg under the tongue every 5 (five) minutes x 3 doses as needed for chest pain (and CALL PROVIDER IF NO RELIEF AFTER THE 3RD DOSE).     polyethylene glycol (MIRALAX / GLYCOLAX) 17 g packet Take 17 g by mouth See admin instructions. Mix 17 grams of powder into 6 oz fluid and drink once a day     repaglinide (PRANDIN) 0.5 MG tablet Take 0.5 mg by mouth daily.     sennosides-docusate sodium (SENOKOT-S) 8.6-50 MG tablet Take 2 tablets by mouth 2 (two) times daily.     Skin Protectants, Misc. (EUCERIN) cream Apply 1 application topically See admin instructions. Apply a thin layer to affected areas of the skin 2 times a day for itching     sodium chloride (OCEAN) 0.65 % SOLN nasal spray Place 1 spray into both nostrils 3 (three) times daily. Place 1 spray into both nostrils three times a day and an additional 1 spray every two hours as needed for for dryness     tamsulosin (FLOMAX) 0.4 MG CAPS capsule Take 1 capsule (0.4 mg total) by mouth daily after supper. 30 capsule 0   VASCEPA 1 g capsule Take 2 g by mouth every evening.     vitamin B-12 (CYANOCOBALAMIN) 1000 MCG tablet Take 1,000 mcg  by mouth daily.     cetirizine (ZYRTEC) 10 MG tablet Take 5 mg by mouth at bedtime.  (Patient not taking: No sig reported)     insulin lispro (HUMALOG) 100 UNIT/ML injection Inject 0-14 Units into the skin See admin instructions. Inject 0-14 units subcutaneously twice daily per sliding scale: CBG 0-200 0 units,  201-250 2 units, 251-300 4 units, 301-350 6 units, 351-400 8 units, 401-450 10 units, 451-500 12 units, >500 14 units If blood sugar is > 400 or < 80 call pec triage (Patient not taking: No sig reported)     loperamide (IMODIUM A-D) 2 MG tablet Take 2 mg by mouth See admin instructions. Take 2 mg by mouth as needed after each loose stool- max of 2 tablets/day (Patient not taking: Reported on 02/17/2021)     Menthol, Topical Analgesic, (BIOFREEZE) 4 % GEL Apply 1 application topically 2 (two) times daily as needed (to affected areas- forehead and left arm). (Patient not taking: Reported on 02/17/2021)     oxybutynin (DITROPAN) 5 MG tablet Take 5 mg by mouth every 8 (eight) hours as needed for bladder spasms.  (Patient not taking: Reported on 02/17/2021)     No current facility-administered medications on file prior to visit.    Allergies:   Allergies  Allergen Reactions   Adhesive [Tape] Rash      OBJECTIVE:  Physical Exam  Vitals:   02/17/21 0838  BP: 129/66  Pulse: 73  Height: 5\' 11"  (1.803 m)    Body mass index is 42.68 kg/m. No results found.  General: Morbidly obese very pleasant elderly Caucasian male, seated, in no evident distress Head: head normocephalic and atraumatic.   Neck: supple with no carotid or supraclavicular bruits Cardiovascular: irregular rate and rhythm, no murmurs Musculoskeletal: no deformity Vascular:  Normal pulses all extremities   Neurologic Exam Mental Status: Awake and fully alert. Fluent speech and language. Oriented to place and time. Recent and remote memory intact. Attention span, concentration and fund of knowledge mostly appropriate. Mood and affect appropriate.  Cranial Nerves: Pupils equal, briskly reactive to light. Extraocular movements full without nystagmus. Visual fields full to confrontation OS and OD blindness except upper outer edge. Hearing intact. Facial sensation intact.  Mild left facial weakness when smiling.  Tongue and palate  moves normally and symmetrically.  Motor: Normal bulk and tone and strength RUE and RLE.  LUE: 4+/5 with increased tone throughout; LLE: 2/5 hip flexor, 4/5 distally Sensory.: intact to touch , pinprick , position and vibratory sensation.  Coordination: Rapid alternating movements normal in all extremities except decreased left hand. Finger-to-nose and heel-to-shin performed accurately on right side. Gait and Station: Deferred as patient nonambulatory Reflexes: 1+ and symmetric. Toes downgoing.         ASSESSMENT: Reginald [name] is a 85 y.o. year old male with recent [name] stroke 9/89/2119 likely secondary to small vessel disease and prior R CRAO 06/09/2020 secondary to right ICA stenosis s/p TCAR with residual OD visual loss. Vascular risk factors include chronic A. fib on Eliquis, prior strokes on imaging, HTN, HLD, DM, CAD, OSA on CPAP, carotid stenosis, CHF, prior strokes on imaging and COVID + 07/2020 and 11/2018.      PLAN:  [name] stroke, SVD R CARO Multiple prior strokes on imaging:  Residual deficit: Left spastic hemiparesis and OD visual loss  Unsure if working with therapies - would like benefit from routine ROM exercises for LUE spasticity Routinely followed by ophthalmology Continue aspirin 81  mg daily and Eliquis (apixaban) daily  and atorvastatin 40 mg daily for secondary stroke prevention.   Discussed secondary stroke prevention measures and importance of close PCP follow up for aggressive stroke risk factor management  R ICA stenosis:  s/p TCAR 06/12/2020 carotid duplex 07/13/2020 right ICA no evidence of stenosis and left ICA 1 to 39% stenosis with ECA > 50%  plan on repeat carotid duplex with Dr. Trula Slade 9 months post procedure.   Chronic A. Fib:  On Eliquis 5 mg twice daily for CHA2DS2-VASc score of at least 8.   Asymptomatic irregular rhythm.   Routinely followed by cardiology HTN: BP goal <130/90.  Well-controlled continuously monitored by facility HLD: LDL  goal <70. On atorvastatin 40 mg daily per PCP.  Request lab work be faxed to office for review DMII: A1c goal<7.0. On Trulicity and Humalog per facility. Request lab work to be faxed to office for review    Follow up in 6 months or call earlier if needed   CC:  [place] provider: Dr. Gertie Fey, Rozanna Boer, MD     I spent 36 minutes of face-to-face and non-face-to-face time with patient and SNF CNA.  This included previsit chart review, lab review, study review, electronic health record documentation, patient education and discussion regarding multiple ischemic strokes with residual deficits and etiology, history of OD CRAO, secondary stroke prevention measures and importance of managing stroke risk factors and answered all other questions to patient satisfaction   Frann Rider, AGNP-BC  [facility] [address] [place], [place] [address]  Phone [phone] Fax [phone] Note: This document was prepared with digital dictation and possible smart phrase technology. Any transcriptional errors that result from this process are unintentional.

## 2021-02-17 NOTE — Patient Instructions (Signed)
Would likely greatly benefit from routine range of motion exercises if not already receiving to hopefully help left sided spasticity/tightness  No changes made today Continue aspirin 81 mg daily and Eliquis (apixaban) daily  and atorvastatin 40mg  daily  for secondary stroke prevention  Continue to follow up with PCP/facility MD regarding cholesterol, blood pressure and diabetes management  Maintain strict control of hypertension with blood pressure goal below 130/90, diabetes with hemoglobin A1c goal below 7% and cholesterol with LDL cholesterol (bad cholesterol) goal below 70 mg/dL.   Please send any completed lab work for further review including lipid panel and A1c      Followup in the future with me in 6 months or call earlier if needed       Thank you for coming to see [place] at [facility]. I hope we have been able to provide you high quality care today.  You may receive a patient satisfaction survey over the next few weeks. We would appreciate your feedback and comments so that we may continue to improve ourselves and the health of our patients.

## 2021-02-18 DIAGNOSIS — Z9181 History of falling: Secondary | ICD-10-CM | POA: Diagnosis not present

## 2021-02-18 DIAGNOSIS — E119 Type 2 diabetes mellitus without complications: Secondary | ICD-10-CM | POA: Diagnosis not present

## 2021-02-18 DIAGNOSIS — R278 Other lack of coordination: Secondary | ICD-10-CM | POA: Diagnosis not present

## 2021-02-18 DIAGNOSIS — R2681 Unsteadiness on feet: Secondary | ICD-10-CM | POA: Diagnosis not present

## 2021-02-18 DIAGNOSIS — M6281 Muscle weakness (generalized): Secondary | ICD-10-CM | POA: Diagnosis not present

## 2021-02-18 DIAGNOSIS — I480 Paroxysmal atrial fibrillation: Secondary | ICD-10-CM | POA: Diagnosis not present

## 2021-02-18 DIAGNOSIS — G9341 Metabolic encephalopathy: Secondary | ICD-10-CM | POA: Diagnosis not present

## 2021-02-18 DIAGNOSIS — J449 Chronic obstructive pulmonary disease, unspecified: Secondary | ICD-10-CM | POA: Diagnosis not present

## 2021-02-18 DIAGNOSIS — M6259 Muscle wasting and atrophy, not elsewhere classified, multiple sites: Secondary | ICD-10-CM | POA: Diagnosis not present

## 2021-02-18 DIAGNOSIS — I639 Cerebral infarction, unspecified: Secondary | ICD-10-CM | POA: Diagnosis not present

## 2021-02-18 DIAGNOSIS — R41841 Cognitive communication deficit: Secondary | ICD-10-CM | POA: Diagnosis not present

## 2021-02-18 DIAGNOSIS — N184 Chronic kidney disease, stage 4 (severe): Secondary | ICD-10-CM | POA: Diagnosis not present

## 2021-02-18 DIAGNOSIS — R262 Difficulty in walking, not elsewhere classified: Secondary | ICD-10-CM | POA: Diagnosis not present

## 2021-02-18 DIAGNOSIS — R1312 Dysphagia, oropharyngeal phase: Secondary | ICD-10-CM | POA: Diagnosis not present

## 2021-02-18 DIAGNOSIS — N139 Obstructive and reflux uropathy, unspecified: Secondary | ICD-10-CM | POA: Diagnosis not present

## 2021-02-18 DIAGNOSIS — R2689 Other abnormalities of gait and mobility: Secondary | ICD-10-CM | POA: Diagnosis not present

## 2021-02-22 NOTE — Progress Notes (Signed)
I agree with the above plan 

## 2021-03-03 DIAGNOSIS — E119 Type 2 diabetes mellitus without complications: Secondary | ICD-10-CM | POA: Diagnosis not present

## 2021-03-03 DIAGNOSIS — N184 Chronic kidney disease, stage 4 (severe): Secondary | ICD-10-CM | POA: Diagnosis not present

## 2021-03-03 DIAGNOSIS — I10 Essential (primary) hypertension: Secondary | ICD-10-CM | POA: Diagnosis not present

## 2021-03-03 DIAGNOSIS — I4891 Unspecified atrial fibrillation: Secondary | ICD-10-CM | POA: Diagnosis not present

## 2021-03-04 DIAGNOSIS — E119 Type 2 diabetes mellitus without complications: Secondary | ICD-10-CM | POA: Diagnosis not present

## 2021-03-04 DIAGNOSIS — I5032 Chronic diastolic (congestive) heart failure: Secondary | ICD-10-CM | POA: Diagnosis not present

## 2021-03-04 DIAGNOSIS — I2510 Atherosclerotic heart disease of native coronary artery without angina pectoris: Secondary | ICD-10-CM | POA: Diagnosis not present

## 2021-03-12 DIAGNOSIS — E119 Type 2 diabetes mellitus without complications: Secondary | ICD-10-CM | POA: Diagnosis not present

## 2021-03-12 DIAGNOSIS — M6281 Muscle weakness (generalized): Secondary | ICD-10-CM | POA: Diagnosis not present

## 2021-03-12 DIAGNOSIS — I480 Paroxysmal atrial fibrillation: Secondary | ICD-10-CM | POA: Diagnosis not present

## 2021-03-12 DIAGNOSIS — R1312 Dysphagia, oropharyngeal phase: Secondary | ICD-10-CM | POA: Diagnosis not present

## 2021-03-12 DIAGNOSIS — Z9181 History of falling: Secondary | ICD-10-CM | POA: Diagnosis not present

## 2021-03-12 DIAGNOSIS — G9341 Metabolic encephalopathy: Secondary | ICD-10-CM | POA: Diagnosis not present

## 2021-03-12 DIAGNOSIS — R2689 Other abnormalities of gait and mobility: Secondary | ICD-10-CM | POA: Diagnosis not present

## 2021-03-12 DIAGNOSIS — R262 Difficulty in walking, not elsewhere classified: Secondary | ICD-10-CM | POA: Diagnosis not present

## 2021-03-12 DIAGNOSIS — R41841 Cognitive communication deficit: Secondary | ICD-10-CM | POA: Diagnosis not present

## 2021-03-12 DIAGNOSIS — R2681 Unsteadiness on feet: Secondary | ICD-10-CM | POA: Diagnosis not present

## 2021-03-12 DIAGNOSIS — N139 Obstructive and reflux uropathy, unspecified: Secondary | ICD-10-CM | POA: Diagnosis not present

## 2021-03-12 DIAGNOSIS — M6259 Muscle wasting and atrophy, not elsewhere classified, multiple sites: Secondary | ICD-10-CM | POA: Diagnosis not present

## 2021-03-12 DIAGNOSIS — I639 Cerebral infarction, unspecified: Secondary | ICD-10-CM | POA: Diagnosis not present

## 2021-03-12 DIAGNOSIS — N184 Chronic kidney disease, stage 4 (severe): Secondary | ICD-10-CM | POA: Diagnosis not present

## 2021-03-12 DIAGNOSIS — J449 Chronic obstructive pulmonary disease, unspecified: Secondary | ICD-10-CM | POA: Diagnosis not present

## 2021-03-12 DIAGNOSIS — R278 Other lack of coordination: Secondary | ICD-10-CM | POA: Diagnosis not present

## 2021-03-25 DIAGNOSIS — H2513 Age-related nuclear cataract, bilateral: Secondary | ICD-10-CM | POA: Diagnosis not present

## 2021-03-25 DIAGNOSIS — E119 Type 2 diabetes mellitus without complications: Secondary | ICD-10-CM | POA: Diagnosis not present

## 2021-03-25 DIAGNOSIS — H401134 Primary open-angle glaucoma, bilateral, indeterminate stage: Secondary | ICD-10-CM | POA: Diagnosis not present

## 2021-03-25 DIAGNOSIS — I4891 Unspecified atrial fibrillation: Secondary | ICD-10-CM | POA: Diagnosis not present

## 2021-03-25 DIAGNOSIS — I10 Essential (primary) hypertension: Secondary | ICD-10-CM | POA: Diagnosis not present

## 2021-03-25 DIAGNOSIS — H25013 Cortical age-related cataract, bilateral: Secondary | ICD-10-CM | POA: Diagnosis not present

## 2021-03-25 DIAGNOSIS — L304 Erythema intertrigo: Secondary | ICD-10-CM | POA: Diagnosis not present

## 2021-04-08 ENCOUNTER — Observation Stay (HOSPITAL_COMMUNITY)
Admission: EM | Admit: 2021-04-08 | Discharge: 2021-04-09 | Disposition: A | Discharge status: Home / Self Care | Payer: Medicare Other | Attending: Internal Medicine | Admitting: Internal Medicine

## 2021-04-08 ENCOUNTER — Emergency Department (HOSPITAL_COMMUNITY): Payer: Medicare Other

## 2021-04-08 ENCOUNTER — Other Ambulatory Visit: Payer: Self-pay

## 2021-04-08 DIAGNOSIS — I129 Hypertensive chronic kidney disease with stage 1 through stage 4 chronic kidney disease, or unspecified chronic kidney disease: Secondary | ICD-10-CM | POA: Insufficient documentation

## 2021-04-08 DIAGNOSIS — N183 Chronic kidney disease, stage 3 unspecified: Secondary | ICD-10-CM | POA: Diagnosis not present

## 2021-04-08 DIAGNOSIS — J939 Pneumothorax, unspecified: Secondary | ICD-10-CM

## 2021-04-08 DIAGNOSIS — E1159 Type 2 diabetes mellitus with other circulatory complications: Secondary | ICD-10-CM | POA: Diagnosis present

## 2021-04-08 DIAGNOSIS — N261 Atrophy of kidney (terminal): Secondary | ICD-10-CM | POA: Diagnosis not present

## 2021-04-08 DIAGNOSIS — Z79899 Other long term (current) drug therapy: Secondary | ICD-10-CM | POA: Diagnosis not present

## 2021-04-08 DIAGNOSIS — E119 Type 2 diabetes mellitus without complications: Secondary | ICD-10-CM

## 2021-04-08 DIAGNOSIS — Z7901 Long term (current) use of anticoagulants: Secondary | ICD-10-CM | POA: Diagnosis not present

## 2021-04-08 DIAGNOSIS — I701 Atherosclerosis of renal artery: Secondary | ICD-10-CM | POA: Diagnosis not present

## 2021-04-08 DIAGNOSIS — Z7409 Other reduced mobility: Secondary | ICD-10-CM | POA: Diagnosis not present

## 2021-04-08 DIAGNOSIS — I5032 Chronic diastolic (congestive) heart failure: Secondary | ICD-10-CM

## 2021-04-08 DIAGNOSIS — E1122 Type 2 diabetes mellitus with diabetic chronic kidney disease: Secondary | ICD-10-CM | POA: Diagnosis not present

## 2021-04-08 DIAGNOSIS — N184 Chronic kidney disease, stage 4 (severe): Secondary | ICD-10-CM | POA: Insufficient documentation

## 2021-04-08 DIAGNOSIS — R079 Chest pain, unspecified: Secondary | ICD-10-CM | POA: Diagnosis not present

## 2021-04-08 DIAGNOSIS — I4891 Unspecified atrial fibrillation: Secondary | ICD-10-CM | POA: Diagnosis not present

## 2021-04-08 DIAGNOSIS — I700 Atherosclerosis of aorta: Secondary | ICD-10-CM | POA: Diagnosis not present

## 2021-04-08 DIAGNOSIS — Z794 Long term (current) use of insulin: Secondary | ICD-10-CM | POA: Diagnosis not present

## 2021-04-08 DIAGNOSIS — I4821 Permanent atrial fibrillation: Secondary | ICD-10-CM | POA: Diagnosis not present

## 2021-04-08 DIAGNOSIS — I25118 Atherosclerotic heart disease of native coronary artery with other forms of angina pectoris: Secondary | ICD-10-CM

## 2021-04-08 DIAGNOSIS — J9811 Atelectasis: Secondary | ICD-10-CM | POA: Diagnosis not present

## 2021-04-08 DIAGNOSIS — N1831 Chronic kidney disease, stage 3a: Secondary | ICD-10-CM

## 2021-04-08 DIAGNOSIS — I5030 Unspecified diastolic (congestive) heart failure: Secondary | ICD-10-CM | POA: Insufficient documentation

## 2021-04-08 DIAGNOSIS — J9311 Primary spontaneous pneumothorax: Principal | ICD-10-CM | POA: Insufficient documentation

## 2021-04-08 DIAGNOSIS — Z7982 Long term (current) use of aspirin: Secondary | ICD-10-CM | POA: Diagnosis not present

## 2021-04-08 DIAGNOSIS — J449 Chronic obstructive pulmonary disease, unspecified: Secondary | ICD-10-CM | POA: Insufficient documentation

## 2021-04-08 DIAGNOSIS — R0789 Other chest pain: Secondary | ICD-10-CM | POA: Diagnosis not present

## 2021-04-08 DIAGNOSIS — R000 Tachycardia, unspecified: Secondary | ICD-10-CM | POA: Diagnosis not present

## 2021-04-08 DIAGNOSIS — I10 Essential (primary) hypertension: Secondary | ICD-10-CM | POA: Diagnosis not present

## 2021-04-08 DIAGNOSIS — R0902 Hypoxemia: Secondary | ICD-10-CM | POA: Diagnosis not present

## 2021-04-08 DIAGNOSIS — Z20822 Contact with and (suspected) exposure to covid-19: Secondary | ICD-10-CM | POA: Insufficient documentation

## 2021-04-08 DIAGNOSIS — I2510 Atherosclerotic heart disease of native coronary artery without angina pectoris: Secondary | ICD-10-CM | POA: Diagnosis not present

## 2021-04-08 DIAGNOSIS — Z8616 Personal history of COVID-19: Secondary | ICD-10-CM | POA: Diagnosis not present

## 2021-04-08 LAB — CBC WITH DIFFERENTIAL/PLATELET
Abs Immature Granulocytes: 0.05 10*3/uL (ref 0.00–0.07)
Basophils Absolute: 0.1 10*3/uL (ref 0.0–0.1)
Basophils Relative: 0 %
Eosinophils Absolute: 0.5 10*3/uL (ref 0.0–0.5)
Eosinophils Relative: 4 %
HCT: 59.1 % — ABNORMAL HIGH (ref 39.0–52.0)
Hemoglobin: 18.6 g/dL — ABNORMAL HIGH (ref 13.0–17.0)
Immature Granulocytes: 0 %
Lymphocytes Relative: 14 %
Lymphs Abs: 1.7 10*3/uL (ref 0.7–4.0)
MCH: 28.4 pg (ref 26.0–34.0)
MCHC: 31.5 g/dL (ref 30.0–36.0)
MCV: 90.4 fL (ref 80.0–100.0)
Monocytes Absolute: 0.9 10*3/uL (ref 0.1–1.0)
Monocytes Relative: 8 %
Neutro Abs: 8.8 10*3/uL — ABNORMAL HIGH (ref 1.7–7.7)
Neutrophils Relative %: 74 %
Platelets: 204 10*3/uL (ref 150–400)
RBC: 6.54 MIL/uL — ABNORMAL HIGH (ref 4.22–5.81)
RDW: 16.4 % — ABNORMAL HIGH (ref 11.5–15.5)
WBC: 11.9 10*3/uL — ABNORMAL HIGH (ref 4.0–10.5)
nRBC: 0 % (ref 0.0–0.2)

## 2021-04-08 LAB — COMPREHENSIVE METABOLIC PANEL
ALT: 38 U/L (ref 0–44)
AST: 34 U/L (ref 15–41)
Albumin: 3.6 g/dL (ref 3.5–5.0)
Alkaline Phosphatase: 90 U/L (ref 38–126)
Anion gap: 9 (ref 5–15)
BUN: 17 mg/dL (ref 8–23)
CO2: 28 mmol/L (ref 22–32)
Calcium: 9.3 mg/dL (ref 8.9–10.3)
Chloride: 103 mmol/L (ref 98–111)
Creatinine, Ser: 1.27 mg/dL — ABNORMAL HIGH (ref 0.61–1.24)
GFR, Estimated: 56 mL/min — ABNORMAL LOW (ref >60)
Glucose, Bld: 121 mg/dL — ABNORMAL HIGH (ref 70–99)
Potassium: 3.7 mmol/L (ref 3.5–5.1)
Sodium: 140 mmol/L (ref 135–145)
Total Bilirubin: 1.1 mg/dL (ref 0.3–1.2)
Total Protein: 6.5 g/dL (ref 6.5–8.1)

## 2021-04-08 LAB — RESP PANEL BY RT-PCR (FLU A&B, COVID) ARPGX2
Influenza A by PCR: NEGATIVE
Influenza B by PCR: NEGATIVE
SARS Coronavirus 2 by RT PCR: NEGATIVE

## 2021-04-08 LAB — TROPONIN I (HIGH SENSITIVITY)
Troponin I (High Sensitivity): 18 ng/L — ABNORMAL HIGH (ref <18)
Troponin I (High Sensitivity): 11 ng/L (ref <18)

## 2021-04-08 MED ORDER — HYDRALAZINE HCL 25 MG PO TABS
12.5000 mg | ORAL_TABLET | Freq: Two times a day (BID) | ORAL | Status: DC
  Administered 2021-04-09 (×2): 12.5 mg via ORAL
  Filled 2021-04-08 (×2): qty 1

## 2021-04-08 MED ORDER — MORPHINE SULFATE (PF) 4 MG/ML IV SOLN
4.0000 mg | Freq: Once | INTRAVENOUS | Status: AC
  Administered 2021-04-08: 4 mg via INTRAVENOUS
  Filled 2021-04-08: qty 1

## 2021-04-08 MED ORDER — TAMSULOSIN HCL 0.4 MG PO CAPS: 0.4000 mg | ORAL_CAPSULE | Freq: Every day | ORAL | Status: DC

## 2021-04-08 MED ORDER — ONDANSETRON HCL 4 MG PO TABS: 4.0000 mg | ORAL_TABLET | Freq: Four times a day (QID) | ORAL | Status: DC | PRN

## 2021-04-08 MED ORDER — ASPIRIN EC 81 MG PO TBEC
81.0000 mg | DELAYED_RELEASE_TABLET | Freq: Every day | ORAL | Status: DC
  Administered 2021-04-09: 81 mg via ORAL
  Filled 2021-04-08: qty 1

## 2021-04-08 MED ORDER — LORATADINE 10 MG PO TABS
10.0000 mg | ORAL_TABLET | Freq: Every day | ORAL | Status: DC
  Administered 2021-04-09: 10 mg via ORAL
  Filled 2021-04-08 (×2): qty 1

## 2021-04-08 MED ORDER — MORPHINE SULFATE (PF) 2 MG/ML IV SOLN: 2.0000 mg | INTRAVENOUS | Status: DC | PRN

## 2021-04-08 MED ORDER — NYSTATIN 100000 UNIT/GM EX POWD: 1.0000 "application " | Freq: Three times a day (TID) | CUTANEOUS | Status: DC

## 2021-04-08 MED ORDER — APIXABAN 5 MG PO TABS
5.0000 mg | ORAL_TABLET | Freq: Two times a day (BID) | ORAL | Status: DC
  Administered 2021-04-09 (×2): 5 mg via ORAL
  Filled 2021-04-08 (×2): qty 1

## 2021-04-08 MED ORDER — DULAGLUTIDE 3 MG/0.5ML ~~LOC~~ SOAJ: 3.0000 mg | SUBCUTANEOUS | Status: DC

## 2021-04-08 MED ORDER — ICOSAPENT ETHYL 1 G PO CAPS
2.0000 g | ORAL_CAPSULE | Freq: Every evening | ORAL | Status: DC
  Filled 2021-04-08: qty 2

## 2021-04-08 MED ORDER — ATORVASTATIN CALCIUM 40 MG PO TABS
40.0000 mg | ORAL_TABLET | Freq: Every day | ORAL | Status: DC
  Administered 2021-04-09: 40 mg via ORAL
  Filled 2021-04-08: qty 1

## 2021-04-08 MED ORDER — CYANOCOBALAMIN 500 MCG PO TABS
500.0000 ug | ORAL_TABLET | Freq: Every day | ORAL | Status: DC
  Administered 2021-04-09: 500 ug via ORAL
  Filled 2021-04-08: qty 1

## 2021-04-08 MED ORDER — ACETAMINOPHEN 650 MG RE SUPP: 650.0000 mg | Freq: Four times a day (QID) | RECTAL | Status: DC | PRN

## 2021-04-08 MED ORDER — GABAPENTIN 100 MG PO CAPS
200.0000 mg | ORAL_CAPSULE | Freq: Every day | ORAL | Status: DC
  Administered 2021-04-09: 200 mg via ORAL
  Filled 2021-04-08: qty 2

## 2021-04-08 MED ORDER — ACETAMINOPHEN 325 MG PO TABS
650.0000 mg | ORAL_TABLET | Freq: Four times a day (QID) | ORAL | Status: DC | PRN
  Administered 2021-04-09: 650 mg via ORAL
  Filled 2021-04-08: qty 2

## 2021-04-08 MED ORDER — MAGNESIUM OXIDE -MG SUPPLEMENT 400 (240 MG) MG PO TABS
400.0000 mg | ORAL_TABLET | Freq: Every day | ORAL | Status: DC
  Administered 2021-04-09: 400 mg via ORAL
  Filled 2021-04-08 (×2): qty 1

## 2021-04-08 MED ORDER — LATANOPROST 0.005 % OP SOLN: 1.0000 [drp] | Freq: Every day | OPHTHALMIC | Status: DC

## 2021-04-08 MED ORDER — INSULIN ASPART 100 UNIT/ML IJ SOLN
0.0000 [IU] | Freq: Three times a day (TID) | INTRAMUSCULAR | Status: DC
  Administered 2021-04-09: 3 [IU] via SUBCUTANEOUS
  Administered 2021-04-09: 2 [IU] via SUBCUTANEOUS
  Filled 2021-04-08: qty 0.15

## 2021-04-08 MED ORDER — SODIUM CHLORIDE (PF) 0.9 % IJ SOLN
INTRAMUSCULAR | Status: AC
  Filled 2021-04-08: qty 50

## 2021-04-08 MED ORDER — SENNOSIDES-DOCUSATE SODIUM 8.6-50 MG PO TABS
2.0000 | ORAL_TABLET | Freq: Two times a day (BID) | ORAL | Status: DC
  Administered 2021-04-09 (×2): 2 via ORAL
  Filled 2021-04-08 (×2): qty 2

## 2021-04-08 MED ORDER — METOPROLOL SUCCINATE ER 25 MG PO TB24
12.5000 mg | ORAL_TABLET | Freq: Every day | ORAL | Status: DC
  Administered 2021-04-09: 12.5 mg via ORAL
  Filled 2021-04-08: qty 0.5

## 2021-04-08 MED ORDER — REPAGLINIDE 1 MG PO TABS
0.5000 mg | ORAL_TABLET | Freq: Every day | ORAL | Status: DC
  Administered 2021-04-09: 0.5 mg via ORAL
  Filled 2021-04-08: qty 0.5

## 2021-04-08 MED ORDER — ALBUTEROL SULFATE HFA 108 (90 BASE) MCG/ACT IN AERS: 2.0000 | INHALATION_SPRAY | Freq: Two times a day (BID) | RESPIRATORY_TRACT | Status: DC | PRN

## 2021-04-08 MED ORDER — LOSARTAN POTASSIUM 25 MG PO TABS
50.0000 mg | ORAL_TABLET | Freq: Every day | ORAL | Status: DC
  Administered 2021-04-09: 50 mg via ORAL
  Filled 2021-04-08: qty 2

## 2021-04-08 MED ORDER — DORZOLAMIDE HCL-TIMOLOL MAL 2-0.5 % OP SOLN: 1.0000 [drp] | Freq: Two times a day (BID) | OPHTHALMIC | Status: DC

## 2021-04-08 MED ORDER — ONDANSETRON HCL 4 MG/2ML IJ SOLN: 4.0000 mg | Freq: Four times a day (QID) | INTRAMUSCULAR | Status: DC | PRN

## 2021-04-08 MED ORDER — IOHEXOL 350 MG/ML SOLN
100.0000 mL | Freq: Once | INTRAVENOUS | Status: AC | PRN
  Administered 2021-04-08: 100 mL via INTRAVENOUS

## 2021-04-08 MED ORDER — DEXTRAN 70-HYPROMELLOSE (PF) 0.1-0.3 % OP SOLN: 1.0000 [drp] | Freq: Four times a day (QID) | OPHTHALMIC | Status: DC

## 2021-04-08 MED ORDER — MELATONIN 5 MG PO TABS
5.0000 mg | ORAL_TABLET | Freq: Every day | ORAL | Status: DC
  Administered 2021-04-09: 5 mg via ORAL
  Filled 2021-04-08: qty 1

## 2021-04-08 MED ORDER — POLYETHYLENE GLYCOL 3350 17 G PO PACK: 17.0000 g | PACK | Freq: Every day | ORAL | Status: DC

## 2021-04-08 NOTE — ED Triage Notes (Signed)
Patient BIB EMS from SNF ([facility]) c/o Chestpain started tonight. Per report pt had nitro at facility with no relief. Pt denies N/V/D. Pt a/xo4.

## 2021-04-08 NOTE — ED Provider Notes (Signed)
Reginald [name]   CSN: [id number] Arrival date & time: 04/08/21  1853     History Chief Complaint  Patient presents with   Chest Pain    Reginald [name] is a 85 y.o. male hx of diabetes, CAD status post stent, COPD, CHF, A. fib on Eliquis here presenting with chest pain.  Patient states that he has pleuritic chest pain acute onset today.  Patient is from a nursing home.  He has CAD with stents.  He is also has history of A. fib.  Patient is bedbound.  The history is provided by the patient.      Past Medical History:  Diagnosis Date   Anteroseptal myocardial infarction [facility])    Carotid artery occlusion    CHF (congestive heart failure) (HCC)    Chronic kidney disease    COPD (chronic obstructive pulmonary disease) (HCC)    Coronary atherosclerosis of unspecified type of vessel, native or graft    a. s/p prior PCI to LAD in 1997   Diverticulosis    ED (erectile dysfunction)    Foley catheter in place    Glaucoma    Hematuria    History of 2019 novel coronavirus disease (COVID-19) 11/2018   Hydronephrosis, bilateral    Hydroureter    Hypercholesteremia    Hypertensive renal disease    Hypogonadism male    Hypokalemia    Obesity hypoventilation syndrome (HCC)    Obesity, unspecified    OSA (obstructive sleep apnea)    Peripheral vascular disease ([place])    Permanent atrial fibrillation ([place])    Phimosis    Pneumonia    Rhabdomyolysis    Stroke ([place])    Tremor    Type II or unspecified type diabetes mellitus without mention of complication, not stated as uncontrolled    Unspecified essential hypertension    Urinary incontinence    Urinary retention    Vestibulopathy     Patient Active Problem List   Diagnosis Date Noted   Stroke ([place]) 08/05/2020   Vision loss of right eye 06/09/2020   Acute on chronic renal failure ([place]) 12/19/2018   Acute renal failure superimposed on stage 4 chronic kidney disease ([place])  12/19/2018   AKI (acute kidney injury) ([place]) 11/26/2018   HCAP (healthcare-associated pneumonia) 11/15/2018   COVID-19 11/15/2018   Chronic venous insufficiency 10/04/2017   Venous stasis ulcers of both lower extremities ([place]) 10/04/2017   Nonspecific chest pain 10/27/2016   Morbid obesity ([place]) 05/02/2016   Lymphedema 05/02/2016   Abnormal laboratory test result 10/15/2015   Cough 10/08/2015   Hematuria 09/21/2015   Hypokalemia 08/13/2015   HTN (hypertension) 08/13/2015   Multiple open wounds of lower leg 07/22/2015   CKD (chronic kidney disease) stage 4, GFR 15-29 ml/min ([place]) 07/22/2015   Cellulitis of leg, right 06/28/2015   ARF (acute renal failure) ([place]) 06/28/2015   Hypertensive heart disease with CHF (congestive heart failure) ([place]) 05/19/2015   CAD (coronary artery disease), native coronary artery 05/19/2015   Hyperlipidemia 05/19/2015   Vitamin D deficiency 05/19/2015   Pressure ulcer 13/24/4010   Diastolic dysfunction with chronic heart failure ([place]) 05/08/2015   Blisters of multiple sites 05/08/2015   Type 2 diabetes mellitus ([place])    Obesity, unspecified    OSA (obstructive sleep apnea)    Atrial fibrillation ([place])    Secondary DM with CKD stage 4 and hypertension ([place])    Glaucoma    Cellulitis 05/07/2015    Past Surgical History:  Procedure Laterality Date   APPENDECTOMY  1985   CARDIAC CATHETERIZATION     CHOLECYSTECTOMY  06/2000   CIRCUMCISION N/A 05/28/2020   Procedure: CIRCUMCISION ADULT;  Surgeon: Franchot Gallo, MD;  Location: [facility] ORS;  Service: Urology;  Laterality: N/A;   CORONARY ANGIOPLASTY  08/14/1995   stent placement to LAD    DORSAL SLIT N/A 05/28/2020   Procedure: DORSAL SLIT;  Surgeon: Franchot Gallo, MD;  Location: [facility];  Service: Urology;  Laterality: N/A;  48 MINS   ENDOVENOUS ABLATION SAPHENOUS VEIN W/ LASER Left 02/21/2018   endovenous laser ablation L GSV by Ruta Hinds MD    INGUINAL HERNIA REPAIR Right 1985   KNEE  ARTHROSCOPY Left 1991   LUMBAR FUSION     NOSE SURGERY  1970s   Per Dr. Terrence Dupont [place] in pt chart   TRANSCAROTID ARTERY REVASCULARIZATION  Right 06/12/2020   Procedure: RIGHT TRANSCAROTID ARTERY REVASCULARIZATION;  Surgeon: Serafina Mitchell, MD;  Location[facility];  Service: Vascular;  Laterality: Right;       Family History  Problem Relation Age of Onset   COPD Mother    Heart disease Mother    Diabetes Father    Heart disease Father    Diabetes Sister    Heart disease Brother    Heart disease Brother    Breast cancer Maternal Aunt    Diabetes Paternal Grandmother    Colon cancer Maternal Aunt    Ovarian cancer Daughter    Glaucoma Other     Social History   Tobacco Use   Smoking status: Never   Smokeless tobacco: Never  Vaping Use   Vaping Use: Never used  Substance Use Topics   Alcohol use: No    Alcohol/week: 0.0 standard drinks   Drug use: No    Home Medications Prior to Admission medications   Medication Sig Start Date End Date Taking? Authorizing Provider  acetaminophen (TYLENOL) 325 MG tablet Take 650 mg by mouth every 4 (four) hours as needed (for pain).    [provider]  albuterol (VENTOLIN HFA) 108 (90 Base) MCG/ACT inhaler Inhale 2 puffs into the lungs 2 (two) times daily as needed for wheezing or shortness of breath.    [provider]  apixaban (ELIQUIS) 5 MG TABS tablet Take 1 tablet (5 mg total) by mouth 2 (two) times daily. 08/07/20   Richarda Osmond, MD  aspirin EC 81 MG EC tablet Take 1 tablet (81 mg total) by mouth daily. Swallow whole. 06/13/20   Hosie Poisson, MD  atorvastatin (LIPITOR) 40 MG tablet Take 40 mg by mouth at bedtime.    [provider]  brimonidine-timolol (COMBIGAN) 0.2-0.5 % ophthalmic solution Place 1 drop into both eyes every 12 (twelve) hours.    [provider]  cetirizine (ZYRTEC) 10 MG tablet Take 5 mg by mouth at bedtime.  Patient not taking: No sig reported    [provider]   Cholecalciferol (VITAMIN D-3) 25 MCG (1000 UT) CAPS Take 2,000 Units by mouth daily.    [provider]  Dextran 70-Hypromellose (ARTIFICIAL TEARS PF OP) Place 1 drop into both eyes 4 (four) times daily.    [provider]  Dulaglutide (TRULICITY) 3 TG/6.2IR SOPN Inject 3 mg into the skin every Monday. Mondays    [provider]  fexofenadine (ALLEGRA) 180 MG tablet Take 180 mg by mouth daily.    [provider]  gabapentin (NEURONTIN) 100 MG capsule Take 2 capsules (200 mg total) by  mouth at bedtime as needed. Patient taking differently: Take 200 mg by mouth at bedtime. 08/07/20   Richarda Osmond, MD  insulin lispro (HUMALOG) 100 UNIT/ML injection Inject 0-14 Units into the skin See admin instructions. Inject 0-14 units subcutaneously twice daily per sliding scale: CBG 0-200 0 units, 201-250 2 units, 251-300 4 units, 301-350 6 units, 351-400 8 units, 401-450 10 units, 451-500 12 units, >500 14 units If blood sugar is > 400 or < 80 call pec triage Patient not taking: No sig reported    [provider]  latanoprost (XALATAN) 0.005 % ophthalmic solution Place 1 drop into both eyes at bedtime. 01/03/17   [provider]  loperamide (IMODIUM A-D) 2 MG tablet Take 2 mg by mouth See admin instructions. Take 2 mg by mouth as needed after each loose stool- max of 2 tablets/day Patient not taking: Reported on 02/17/2021    [provider]  losartan (COZAAR) 25 MG tablet Take 25 mg by mouth daily.    [provider]  magnesium oxide (MAG-OX) 400 MG tablet Take 400 mg by mouth daily.    [provider]  Melatonin 5 MG TABS Take 5 mg by mouth at bedtime.    [provider]  Menthol, Topical Analgesic, (BIOFREEZE) 4 % GEL Apply 1 application topically 2 (two) times daily as needed (to affected areas- forehead and left arm). Patient not taking: Reported on 02/17/2021    [provider]  metoprolol succinate (TOPROL  XL) 25 MG 24 hr tablet Take 0.5 tablets (12.5 mg total) by mouth daily. 06/14/20 06/14/21  Hosie Poisson, MD  nitroGLYCERIN (NITROSTAT) 0.4 MG SL tablet Place 0.4 mg under the tongue every 5 (five) minutes x 3 doses as needed for chest pain (and CALL PROVIDER IF NO RELIEF AFTER THE 3RD DOSE).    [provider]  oxybutynin (DITROPAN) 5 MG tablet Take 5 mg by mouth every 8 (eight) hours as needed for bladder spasms.  Patient not taking: Reported on 02/17/2021 05/07/20   [provider]  polyethylene glycol (MIRALAX / GLYCOLAX) 17 g packet Take 17 g by mouth See admin instructions. Mix 17 grams of powder into 6 oz fluid and drink once a day    [provider]  repaglinide (PRANDIN) 0.5 MG tablet Take 0.5 mg by mouth daily. 08/03/20   [provider]  sennosides-docusate sodium (SENOKOT-S) 8.6-50 MG tablet Take 2 tablets by mouth 2 (two) times daily.    [provider]  Skin Protectants, Misc. (EUCERIN) cream Apply 1 application topically See admin instructions. Apply a thin layer to affected areas of the skin 2 times a day for itching    [provider]  sodium chloride (OCEAN) 0.65 % SOLN nasal spray Place 1 spray into both nostrils 3 (three) times daily. Place 1 spray into both nostrils three times a day and an additional 1 spray every two hours as needed for for dryness    [provider]  tamsulosin (FLOMAX) 0.4 MG CAPS capsule Take 1 capsule (0.4 mg total) by mouth daily after supper. 12/25/18   Kayleen Memos, DO  VASCEPA 1 g capsule Take 2 g by mouth every evening.    [provider]  vitamin B-12 (CYANOCOBALAMIN) 1000 MCG tablet Take 1,000 mcg by mouth daily.    [provider]    Allergies    Adhesive [tape]  Review of Systems   Review of Systems  Cardiovascular:  Positive for chest pain.  All  other systems reviewed and are negative.  Physical Exam Updated Vital Signs BP (!) 147/72 (BP Location: Right Arm)    Pulse 88   Temp 97.8 F (36.6 C) (Oral)   Resp (!) 26   Ht 5\' 11"  (1.803 m)   Wt (!) 138.8 kg   SpO2 99%   BMI 42.68 kg/m   Physical Exam Vitals and nursing [name] reviewed.  Constitutional:      Comments: Chronically ill and uncomfortable  HENT:     Head: Normocephalic.  Eyes:     Extraocular Movements: Extraocular movements intact.     Pupils: Pupils are equal, round, and reactive to light.  Cardiovascular:     Heart sounds: Normal heart sounds.  Pulmonary:     Effort: Pulmonary effort is normal.     Breath sounds: Normal breath sounds.     Comments: Reproducible chest wall tenderness Abdominal:     General: Bowel sounds are normal.     Palpations: Abdomen is soft.  Musculoskeletal:        General: Normal range of motion.     Cervical back: Normal range of motion and neck supple.  Skin:    General: Skin is warm.     Capillary Refill: Capillary refill takes less than 2 seconds.  Neurological:     General: No focal deficit present.     Comments: Bedbound   Psychiatric:        Mood and Affect: Mood normal.        Behavior: Behavior normal.    ED Results / Procedures / Treatments   Labs (all labs ordered are listed, but only abnormal results are displayed) Labs Reviewed  RESP PANEL BY RT-PCR (FLU A&B, COVID) ARPGX2  CBC WITH DIFFERENTIAL/PLATELET  COMPREHENSIVE METABOLIC PANEL  I-STAT CHEM 8, ED  TROPONIN I (HIGH SENSITIVITY)    EKG None  Radiology No results found.  Procedures Procedures   CRITICAL CARE Performed by: Wandra Arthurs   Total critical care time: 30  minutes  Critical care time was exclusive of separately billable procedures and treating other patients.  Critical care was necessary to treat or prevent imminent or life-threatening deterioration.  Critical care was time spent personally by me on the following activities: development of treatment plan with patient and/or surrogate as well as nursing, discussions with consultants, evaluation  of patient's response to treatment, examination of patient, obtaining history from patient or surrogate, ordering and performing treatments and interventions, ordering and review of laboratory studies, ordering and review of radiographic studies, pulse oximetry and re-evaluation of patient's condition.   Medications Ordered in ED Medications  morphine 4 MG/ML injection 4 mg (has no administration in time range)    ED Course  I have reviewed the triage vital signs and the nursing notes.  Pertinent labs & imaging results that were available during my care of the patient were reviewed by me and considered in my medical decision making (see chart for details).    MDM Rules/Calculators/A&P                           Reginald [name] is a 85 y.o. male here presenting with chest pain.  Patient has CAD with stent.  Pain appears pleuritic and is reproducible.  He is high risk for ACS but I also considered PE.  He is on Eliquis but given pleuritic chest pain we will try to get CT angio chest  9:16 PM His chest x-ray  showed dilated aorta.  I change the protocol to CT dissection study.  There is no dissection but there is a small 5% pneumothorax on the left side.  Patient's oxygen level is about 90 to 91%.  I talked to Dr. Mohammed Kindle from [facility].  He states that patient just needs some oxygen and observation.  Patient also will need repeat chest x-ray in the morning.  If his pneumothorax is stable, patient does not need CT surgery consult.  Patient also need to be brought in for ACS rule out as his initial troponin is 18 and he is high risk for ACS  Final Clinical Impression(s) / ED Diagnoses Final diagnoses:  None    Rx / DC Orders ED Discharge Orders     None        Drenda Freeze, MD 04/08/21 2117

## 2021-04-08 NOTE — ED Notes (Signed)
Patient transported to CT 

## 2021-04-08 NOTE — H&P (Signed)
History and Physical    Reginald [name] XYI:[id number] DOB: 05-10-1936 DOA: 04/08/2021  PCP: [name], MD  Patient coming from: SNF  I have personally briefly reviewed patient's old medical records in [place]  Chief Complaint: CP  HPI: Reginald [name] is a 85 y.o. male with medical history significant of CAD s/p Stent, DM2, COPD, dCHF, A.Fib on eliquis.  Pt presents to the ED with c/o CP.  Sharp CP onset earlier today.  L sided, worse with deep breaths.  Associated chest wall TTP.  CP persistent, nothing makes better.  No fever, chills, bed sores.  Pt in SNF at baseline, pt bed bound at baseline.   ED Course: Pt with 5% L PTX.  Satting low 90s on RA  Trop 18.  I Stat K of 8.0 but probably hemolyzed as lab K is only 3.7.  Creat 1.2   Review of Systems: As per HPI, otherwise all review of systems negative.  Past Medical History:  Diagnosis Date   Anteroseptal myocardial infarction [facility])    Carotid artery occlusion    CHF (congestive heart failure) (HCC)    Chronic kidney disease    COPD (chronic obstructive pulmonary disease) (HCC)    Coronary atherosclerosis of unspecified type of vessel, native or graft    a. s/p prior PCI to LAD in 1997   Diverticulosis    ED (erectile dysfunction)    Foley catheter in place    Glaucoma    Hematuria    History of 2019 novel coronavirus disease (COVID-19) 11/2018   Hydronephrosis, bilateral    Hydroureter    Hypercholesteremia    Hypertensive renal disease    Hypogonadism male    Hypokalemia    Obesity hypoventilation syndrome (HCC)    Obesity, unspecified    OSA (obstructive sleep apnea)    Peripheral vascular disease (HCC)    Permanent atrial fibrillation ([place])    Phimosis    Pneumonia    Rhabdomyolysis    Stroke ([place])    Tremor    Type II or unspecified type diabetes mellitus without mention of complication, not stated as uncontrolled    Unspecified essential hypertension    Urinary  incontinence    Urinary retention    Vestibulopathy     Past Surgical History:  Procedure Laterality Date   APPENDECTOMY  1985   CARDIAC CATHETERIZATION     CHOLECYSTECTOMY  06/2000   CIRCUMCISION N/A 05/28/2020   Procedure: CIRCUMCISION ADULT;  Surgeon: Franchot Gallo, MD;  Location: [facility];  Service: Urology;  Laterality: N/A;   CORONARY ANGIOPLASTY  08/14/1995   stent placement to LAD    DORSAL SLIT N/A 05/28/2020   Procedure: DORSAL SLIT;  Surgeon: Franchot Gallo, MD;  Location: [facility];  Service: Urology;  Laterality: N/A;  45 MINS   ENDOVENOUS ABLATION SAPHENOUS VEIN W/ LASER Left 02/21/2018   endovenous laser ablation L GSV by Ruta Hinds MD    INGUINAL HERNIA REPAIR Right 1985   KNEE ARTHROSCOPY Left 1991   LUMBAR FUSION     NOSE SURGERY  1970s   Per Dr. Terrence [facility] in pt chart   TRANSCAROTID ARTERY REVASCULARIZATION  Right 06/12/2020   Procedure: RIGHT TRANSCAROTID ARTERY REVASCULARIZATION;  Surgeon: Serafina Mitchell, MD;  Location: [place];  Service: Vascular;  Laterality: Right;     reports that he has never smoked. He has never used smokeless tobacco. He reports that he does not drink alcohol and does not use drugs.  Allergies  Allergen Reactions   Adhesive [Tape] Rash    Family History  Problem Relation Age of Onset   COPD Mother    Heart disease Mother    Diabetes Father    Heart disease Father    Diabetes Sister    Heart disease Brother    Heart disease Brother    Breast cancer Maternal Aunt    Diabetes Paternal Grandmother    Colon cancer Maternal Aunt    Ovarian cancer Daughter    Glaucoma Other      Prior to Admission medications   Medication Sig Start Date End Date Taking? Authorizing Provider  acetaminophen (TYLENOL) 325 MG tablet Take 650 mg by mouth every 4 (four) hours as needed (for pain).    [provider]  albuterol (VENTOLIN HFA) 108 (90 Base) MCG/ACT inhaler Inhale 2 puffs into the lungs 2 (two) times daily as needed  for wheezing or shortness of breath.    [provider]  apixaban (ELIQUIS) 5 MG TABS tablet Take 1 tablet (5 mg total) by mouth 2 (two) times daily. 08/07/20   Richarda Osmond, MD  aspirin EC 81 MG EC tablet Take 1 tablet (81 mg total) by mouth daily. Swallow whole. 06/13/20   Hosie Poisson, MD  atorvastatin (LIPITOR) 40 MG tablet Take 40 mg by mouth at bedtime.    [provider]  brimonidine-timolol (COMBIGAN) 0.2-0.5 % ophthalmic solution Place 1 drop into both eyes every 12 (twelve) hours.    [provider]  cetirizine (ZYRTEC) 10 MG tablet Take 5 mg by mouth at bedtime.  Patient not taking: No sig reported    [provider]  Cholecalciferol (VITAMIN D-3) 25 MCG (1000 UT) CAPS Take 2,000 Units by mouth daily.    [provider]  Dextran 70-Hypromellose (ARTIFICIAL TEARS PF OP) Place 1 drop into both eyes 4 (four) times daily.    [provider]  Dulaglutide (TRULICITY) 3 PO/2.4MP SOPN Inject 3 mg into the skin every Monday. Mondays    [provider]  fexofenadine (ALLEGRA) 180 MG tablet Take 180 mg by mouth daily.    [provider]  gabapentin (NEURONTIN) 100 MG capsule Take 2 capsules (200 mg total) by mouth at bedtime as needed. Patient taking differently: Take 200 mg by mouth at bedtime. 08/07/20   Richarda Osmond, MD  insulin lispro (HUMALOG) 100 UNIT/ML injection Inject 0-14 Units into the skin See admin instructions. Inject 0-14 units subcutaneously twice daily per sliding scale: CBG 0-200 0 units, 201-250 2 units, 251-300 4 units, 301-350 6 units, 351-400 8 units, 401-450 10 units, 451-500 12 units, >500 14 units If blood sugar is > 400 or < 80 call pec triage Patient not taking: No sig reported    [provider]  latanoprost (XALATAN) 0.005 % ophthalmic solution Place 1 drop into both eyes at bedtime. 01/03/17   [provider]  loperamide (IMODIUM A-D) 2 MG tablet Take 2 mg by mouth See  admin instructions. Take 2 mg by mouth as needed after each loose stool- max of 2 tablets/day Patient not taking: Reported on 02/17/2021    [provider]  losartan (COZAAR) 25 MG tablet Take 25 mg by mouth daily.    [provider]  magnesium oxide (MAG-OX) 400 MG tablet Take 400 mg by mouth daily.    [provider]  Melatonin 5 MG TABS Take 5 mg by mouth at bedtime.    [provider]  Menthol, Topical Analgesic, (BIOFREEZE)  4 % GEL Apply 1 application topically 2 (two) times daily as needed (to affected areas- forehead and left arm). Patient not taking: Reported on 02/17/2021    [provider]  metoprolol succinate (TOPROL XL) 25 MG 24 hr tablet Take 0.5 tablets (12.5 mg total) by mouth daily. 06/14/20 06/14/21  Hosie Poisson, MD  nitroGLYCERIN (NITROSTAT) 0.4 MG SL tablet Place 0.4 mg under the tongue every 5 (five) minutes x 3 doses as needed for chest pain (and CALL PROVIDER IF NO RELIEF AFTER THE 3RD DOSE).    [provider]  oxybutynin (DITROPAN) 5 MG tablet Take 5 mg by mouth every 8 (eight) hours as needed for bladder spasms.  Patient not taking: Reported on 02/17/2021 05/07/20   [provider]  polyethylene glycol (MIRALAX / GLYCOLAX) 17 g packet Take 17 g by mouth See admin instructions. Mix 17 grams of powder into 6 oz fluid and drink once a day    [provider]  repaglinide (PRANDIN) 0.5 MG tablet Take 0.5 mg by mouth daily. 08/03/20   [provider]  sennosides-docusate sodium (SENOKOT-S) 8.6-50 MG tablet Take 2 tablets by mouth 2 (two) times daily.    [provider]  Skin Protectants, Misc. (EUCERIN) cream Apply 1 application topically See admin instructions. Apply a thin layer to affected areas of the skin 2 times a day for itching    [provider]  sodium chloride (OCEAN) 0.65 % SOLN nasal spray Place 1 spray into both nostrils 3 (three) times daily. Place 1 spray into both  nostrils three times a day and an additional 1 spray every two hours as needed for for dryness    [provider]  tamsulosin (FLOMAX) 0.4 MG CAPS capsule Take 1 capsule (0.4 mg total) by mouth daily after supper. 12/25/18   Kayleen Memos, DO  VASCEPA 1 g capsule Take 2 g by mouth every evening.    [provider]  vitamin B-12 (CYANOCOBALAMIN) 1000 MCG tablet Take 1,000 mcg by mouth daily.    [provider]    Physical Exam: Vitals:   04/08/21 1918 04/08/21 1922 04/08/21 2000 04/08/21 2018  BP:  (!) 147/72 132/78   Pulse:  88 99 76  Resp:  (!) 26 (!) 24 20  Temp:  97.8 F (36.6 C)    TempSrc:  Oral    SpO2:  99% 92% 93%  Weight: (!) 138.8 kg     Height: 5\' 11"  (1.803 m)       Constitutional: Ill appearing, uncomfortable Eyes: PERRL, lids and conjunctivae normal ENMT: Mucous membranes are moist. Posterior pharynx clear of any exudate or lesions.Normal dentition.  Neck: normal, supple, no masses, no thyromegaly Respiratory: clear to auscultation bilaterally, no wheezing, no crackles. Normal respiratory effort. No accessory muscle use.  Cardiovascular: Regular rate and rhythm, no murmurs / rubs / gallops. No extremity edema. 2+ pedal pulses. No carotid bruits. Chest wall TTP Abdomen: no tenderness, no masses palpated. No hepatosplenomegaly. Bowel sounds positive.  Musculoskeletal: no clubbing / cyanosis. No joint deformity upper and lower extremities. Good ROM, no contractures. Normal muscle tone.  Neurologic: CN 2-12 grossly intact. Sensation intact, DTR normal. Strength 5/5 in all 4.  Psychiatric: Normal judgment and insight. Alert and oriented x 3. Normal mood.    Labs on Admission: I have personally reviewed following labs and imaging studies  CBC: Recent Labs  Lab 04/08/21 1940 04/08/21 1953  WBC 11.9*  --   NEUTROABS 8.8*  --  HGB 18.6* 19.4*  HCT 59.1* 57.0*  MCV 90.4  --   PLT 204  --    Basic Metabolic Panel: Recent Labs  Lab  04/08/21 1940 04/08/21 1953  NA 140 135  K 3.7 8.0*  CL 103 103  CO2 28  --   GLUCOSE 121* 123*  BUN 17 24*  CREATININE 1.27* 1.20  CALCIUM 9.3  --    GFR: Estimated Creatinine Clearance: 66.4 mL/min (by C-G formula based on SCr of 1.2 mg/dL). Liver Function Tests: Recent Labs  Lab 04/08/21 1940  AST 34  ALT 38  ALKPHOS 90  BILITOT 1.1  PROT 6.5  ALBUMIN 3.6   No results for input(s): LIPASE, AMYLASE in the last 168 hours. No results for input(s): AMMONIA in the last 168 hours. Coagulation Profile: No results for input(s): INR, PROTIME in the last 168 hours. Cardiac Enzymes: No results for input(s): CKTOTAL, CKMB, CKMBINDEX, TROPONINI in the last 168 hours. BNP (last 3 results) No results for input(s): PROBNP in the last 8760 hours. HbA1C: No results for input(s): HGBA1C in the last 72 hours. CBG: No results for input(s): GLUCAP in the last 168 hours. Lipid Profile: No results for input(s): CHOL, HDL, LDLCALC, TRIG, CHOLHDL, LDLDIRECT in the last 72 hours. Thyroid Function Tests: No results for input(s): TSH, T4TOTAL, FREET4, T3FREE, THYROIDAB in the last 72 hours. Anemia Panel: No results for input(s): VITAMINB12, FOLATE, FERRITIN, TIBC, IRON, RETICCTPCT in the last 72 hours. Urine analysis:    Component Value Date/Time   COLORURINE YELLOW 08/10/2020 0055   APPEARANCEUR CLEAR 08/10/2020 0055   LABSPEC 1.014 08/10/2020 0055   PHURINE 6.0 08/10/2020 0055   GLUCOSEU NEGATIVE 08/10/2020 0055   HGBUR NEGATIVE 08/10/2020 0055   BILIRUBINUR NEGATIVE 08/10/2020 0055   KETONESUR NEGATIVE 08/10/2020 0055   PROTEINUR 100 (A) 08/10/2020 0055   UROBILINOGEN 1.0 05/13/2015 1549   NITRITE NEGATIVE 08/10/2020 0055   LEUKOCYTESUR NEGATIVE 08/10/2020 0055    Radiological Exams on Admission: DG Chest Port 1 View  Result Date: 04/08/2021 CLINICAL DATA:  Chest pain. EXAM: PORTABLE CHEST 1 VIEW COMPARISON:  Chest x-ray 01/03/2021. FINDINGS: Cardiac silhouette is enlarged,  unchanged. There is questionable mediastinal bulging at the level of the proximal descending thoracic aorta. This may be artifactual secondary to adjacent left upper lobe airspace disease. There is some atelectasis in the left lung base. There is no pleural effusion or pneumothorax. Degenerative changes affect the spine. IMPRESSION: 1. Questionable bulging at the level of the proximal descending thoracic aorta. This is indeterminate and may be related to adjacent left upper lobe airspace disease, artifact or acute process involving the mediastinum. Recommend follow-up CT chest for further evaluation. Electronically Signed   By: Ronney Asters M.D.   On: 04/08/2021 20:09   CT Angio Chest/Abd/Pel for Dissection W and/or Wo Contrast  Result Date: 04/08/2021 CLINICAL DATA:  Left chest pain with inspiration, widened mediastinum on chest x-ray EXAM: CT ANGIOGRAPHY CHEST, ABDOMEN AND PELVIS TECHNIQUE: Non-contrast CT of the chest was initially obtained. Multidetector CT imaging through the chest, abdomen and pelvis was performed using the standard protocol during bolus administration of intravenous contrast. Multiplanar reconstructed images and MIPs were obtained and reviewed to evaluate the vascular anatomy. CONTRAST:  143mL OMNIPAQUE IOHEXOL 350 MG/ML SOLN COMPARISON:  11/08/2017, 04/08/2021 FINDINGS: CTA CHEST FINDINGS Cardiovascular: No evidence of thoracic aortic aneurysm or dissection. Diffuse atherosclerosis. There is biatrial dilatation, left greater than right. Calcification of the mitral annulus. No pericardial effusion. No filling defects or pulmonary emboli.  Mediastinum/Nodes: No enlarged mediastinal, hilar, or axillary lymph nodes. Thyroid gland, trachea, and esophagus demonstrate no significant findings. Lungs/Pleura: There is a trace left anterior pneumothorax, volume estimated far less than 5%. No airspace disease or effusion. Minimal atelectasis within the left lower lobe. The right chest is clear.  Central airways are patent. Musculoskeletal: No acute or destructive bony lesions. Reconstructed images demonstrate no additional findings. Review of the MIP images confirms the above findings. CTA ABDOMEN AND PELVIS FINDINGS VASCULAR Aorta: Normal caliber aorta without aneurysm, dissection, vasculitis or significant stenosis. Diffuse atherosclerosis. Celiac: Patent without evidence of aneurysm, dissection, vasculitis or significant stenosis. Mild atherosclerosis at the origin. SMA: Patent without evidence of aneurysm, dissection, vasculitis or significant stenosis. Mild atherosclerosis at the origin. Renals: Severe atherosclerosis at the origin of the right renal artery. Mild atherosclerosis on the left. No aneurysm, dissection, or vasculitis. IMA: Patent without evidence of aneurysm, dissection, vasculitis or significant stenosis. Inflow: Patent without evidence of aneurysm, dissection, vasculitis or significant stenosis. Veins: No obvious venous abnormality within the limitations of this arterial phase study. Review of the MIP images confirms the above findings. NON-VASCULAR Hepatobiliary: No focal liver abnormality is seen. Status post cholecystectomy. No biliary dilatation. Pancreas: Unremarkable. No pancreatic ductal dilatation or surrounding inflammatory changes. Spleen: Normal in size without focal abnormality. Adrenals/Urinary Tract: Bilateral renal cortical atrophy. No urinary tract calculi or obstruction. The adrenals and bladder are unremarkable. Stomach/Bowel: No bowel obstruction or ileus. No bowel wall thickening or inflammatory change. Lymphatic: No pathologic adenopathy. Reproductive: Prostate is unremarkable. Other: No free fluid or free gas.  No abdominal wall hernia. Musculoskeletal: No acute or destructive bony lesions. Reconstructed images demonstrate no additional findings. Review of the MIP images confirms the above findings. IMPRESSION: 1. Trace left anterior pneumothorax volume estimated  far less than 5%. 2. No evidence of thoracoabdominal aortic aneurysm or dissection. 3. No evidence of pulmonary embolus. 4. No acute intra-abdominal or intrapelvic process. 5.  Aortic Atherosclerosis (ICD10-I70.0). Critical Value/emergent results were called by telephone at the time of interpretation on 04/08/2021 at 9:01 pm to provider DAVID YAO , who verbally acknowledged these results. Electronically Signed   By: Randa Ngo M.D.   On: 04/08/2021 21:06    EKG: Independently reviewed.  Assessment/Plan Principal Problem:   Pneumothorax Active Problems:   Type 2 diabetes mellitus (HCC)   Atrial fibrillation (HCC)   Diastolic dysfunction with chronic heart failure (HCC)   CAD (coronary artery disease), native coronary artery   CKD (chronic kidney disease) stage 3, GFR 30-59 ml/min (HCC)   HTN (hypertension)    L PTX - ~5% small PTX EDP spoke with CVTS - Repeat CXR in AM If stable, nothing to do Tele monitor Morphine PRN pain Cont pulse ox Repeat CXR ordered for AM CAD - Will get 2nd trop, if stable will not pursue further CAD work up in setting of pleuritic CP with PTX present on CT Cont ASA Cont Statin DM2 - Mod scale SSI AC Cont other home meds HTN - Cont home meds A.Fib - Cont eliquis Cont BB Initial hyperkalemia on iStat - Likely just a hemolyzed specimen Lab BMP shows K 3.7 Creat 1.2 (baseline) No EKG changes Repeat BMP in AM  DVT prophylaxis: Eliquis Code Status: Full Family Communication: No family in room Disposition Plan: back to SNF after pain controlled and PTX demonstrated stable Consults called: EDP spoke with CVTS Admission status: Place in 75     Purvi Ruehl, [place] [facility]  How to contact the  [facility] Attending or Consulting provider [place] or covering provider during after hours [place], for this patient?  Check the care team in [facility] and look for a) attending/consulting [facility] provider listed and b) the [facility] team listed Log into  [email or web address]  Amion Physician Scheduling and messaging for groups and whole hospitals  On call and physician scheduling software for group practices, residents, hospitalists and other medical providers for call, clinic, rotation and shift schedules. On[employer] is a hospital-wide system for scheduling doctors and paging doctors on call. EasyPlot is for scientific plotting and data analysis.  [email or web address]  and use [place]'s universal password to access. If you do not have the password, please contact the hospital operator.  Locate the [facility] provider you are looking for under [facility] and page to a number that you can be directly reached. If you still have difficulty reaching the provider, please page the [facility] (Director on Call) for the Hospitalists listed on amion for assistance.  04/08/2021, 9:34 PM

## 2021-04-09 ENCOUNTER — Observation Stay (HOSPITAL_COMMUNITY): Payer: Medicare Other

## 2021-04-09 ENCOUNTER — Encounter (HOSPITAL_COMMUNITY): Payer: Self-pay | Admitting: Internal Medicine

## 2021-04-09 DIAGNOSIS — I959 Hypotension, unspecified: Secondary | ICD-10-CM | POA: Diagnosis not present

## 2021-04-09 DIAGNOSIS — R531 Weakness: Secondary | ICD-10-CM | POA: Diagnosis not present

## 2021-04-09 DIAGNOSIS — Z7401 Bed confinement status: Secondary | ICD-10-CM | POA: Diagnosis not present

## 2021-04-09 DIAGNOSIS — J939 Pneumothorax, unspecified: Secondary | ICD-10-CM | POA: Diagnosis not present

## 2021-04-09 DIAGNOSIS — J9311 Primary spontaneous pneumothorax: Secondary | ICD-10-CM | POA: Diagnosis not present

## 2021-04-09 LAB — BASIC METABOLIC PANEL
Anion gap: 8 (ref 5–15)
BUN: 17 mg/dL (ref 8–23)
CO2: 25 mmol/L (ref 22–32)
Calcium: 9.3 mg/dL (ref 8.9–10.3)
Chloride: 105 mmol/L (ref 98–111)
Creatinine, Ser: 1.31 mg/dL — ABNORMAL HIGH (ref 0.61–1.24)
GFR, Estimated: 54 mL/min — ABNORMAL LOW (ref >60)
Glucose, Bld: 149 mg/dL — ABNORMAL HIGH (ref 70–99)
Potassium: 4.3 mmol/L (ref 3.5–5.1)
Sodium: 138 mmol/L (ref 135–145)

## 2021-04-09 LAB — HEMOGLOBIN A1C
Hgb A1c MFr Bld: 6 % — ABNORMAL HIGH (ref 4.8–5.6)
Mean Plasma Glucose: 125.5 mg/dL

## 2021-04-09 LAB — CBG MONITORING, ED
Glucose-Capillary: 145 mg/dL — ABNORMAL HIGH (ref 70–99)
Glucose-Capillary: 151 mg/dL — ABNORMAL HIGH (ref 70–99)

## 2021-04-09 NOTE — Discharge Summary (Signed)
[facility]  Physician Discharge Summary   Patient ID: Reginald [name] MRN: [id number] DOB/AGE: Sep 27, 1935 85 y.o.  Admit date: 04/08/2021 Discharge date:   04/09/2021   PCP: [name], MD  DISCHARGE DIAGNOSES:  Pneumothorax, resolved Pleuritic chest pain secondary to above   RECOMMENDATIONS FOR OUTPATIENT FOLLOW UP: Continue oxygen at 2 L/min for now.   Home Health: Going to SNF Equipment/Devices: Oxygen at 2 L/min by nasal cannula  CODE STATUS: Full code  DISCHARGE CONDITION: fair  Diet recommendation: As before  INITIAL HISTORY: Reginald [name] is a 86 y.o. male with medical history significant of CAD s/p Stent, DM2, COPD, dCHF, A.Fib on eliquis.  Patient presented with complaints of chest pain which appeared to be pleuritic in nature.  CT angiogram did not show any PE but showed a very small pneumothorax.  Patient was observed in the hospital for further evaluation  Consultations: Phone consultation with cardiothoracic surgery  Procedures: None   HOSPITAL COURSE:   Left-sided pneumothorax A small pneumothorax noted on CT angiogram.  No PE, aneurysm or dissection was noted.  The pneumothorax is likely the reason for his pleuritic pain.  Troponins were unremarkable.  EKG did not show any acute changes.  Pneumothorax was discussed with cardiothoracic surgeon Dr. Mohammed Kindle.  No intervention was recommended since patient was stable except to continue with oxygen.  X-ray was repeated this morning and shows resolved pneumothorax.  Patient is otherwise stable.  Continues to have some pleuritic pain but saturations in the 90s on oxygen.  Will recommend that he continue oxygen therapy at skilled nursing facility for now.  His other medical problems including coronary artery disease, diabetes mellitus type 2, essential hypertension, atrial fibrillation for which he is on Eliquis and beta-blocker are all stable.  He is nonambulatory at baseline.  Metabolic panel  from this morning shows stable findings.  HbA1c 6.0.  Class III obesity Estimated body mass index is 42.68 kg/m as calculated from the following:   Height as of this encounter: 5\' 11"  (1.803 m).   Weight as of this encounter: 138.8 kg.   Patient appears to be stable.  Chest x-ray shows improvement.  Okay for discharge back to his skilled nursing facility.   PERTINENT LABS:  The results of significant diagnostics from this hospitalization (including imaging, microbiology, ancillary and laboratory) are listed below for reference.    Microbiology: Recent Results (from the past 240 hour(s))  Resp Panel by RT-PCR (Flu A&B, Covid) Nasopharyngeal Swab     Status: None   Collection Time: 04/08/21  7:20 PM   Specimen: Nasopharyngeal Swab; Nasopharyngeal(NP) swabs in vial transport medium  Result Value Ref Range Status   SARS Coronavirus 2 by RT PCR NEGATIVE NEGATIVE Final    Comment: (NOTE) SARS-CoV-2 target nucleic acids are NOT DETECTED.  The SARS-CoV-2 RNA is generally detectable in upper respiratory specimens during the acute phase of infection. The lowest concentration of SARS-CoV-2 viral copies this assay can detect is 138 copies/mL. A negative result does not preclude SARS-Cov-2 infection and should not be used as the sole basis for treatment or other patient management decisions. A negative result may occur with  improper specimen collection/handling, submission of specimen other than nasopharyngeal swab, presence of viral mutation(s) within the areas targeted by this assay, and inadequate number of viral copies(<138 copies/mL). A negative result must be combined with clinical observations, patient history, and epidemiological information. The expected result is Negative.  Fact Sheet for Patients:  EntrepreneurPulse.com.au  Fact Sheet for  Healthcare Providers:  IncredibleEmployment.be  This test is no t yet approved or cleared by the  [place] and  has been authorized for detection and/or diagnosis of SARS-CoV-2 by FDA under an Emergency Use Authorization (EUA). This EUA will remain  in effect (meaning this test can be used) for the duration of the COVID-19 declaration under Section 564(b)(1) of the Act, 21 U.S.C.section 360bbb-3(b)(1), unless the authorization is terminated  or revoked sooner.       Influenza A by PCR NEGATIVE NEGATIVE Final   Influenza B by PCR NEGATIVE NEGATIVE Final    Comment: (NOTE) The Xpert Xpress SARS-CoV-2/FLU/RSV plus assay is intended as an aid in the diagnosis of influenza from Nasopharyngeal swab specimens and should not be used as a sole basis for treatment. Nasal washings and aspirates are unacceptable for Xpert Xpress SARS-CoV-2/FLU/RSV testing.  Fact Sheet for Patients: EntrepreneurPulse.com.au  Fact Sheet for Healthcare Providers: IncredibleEmployment.be  This test is not yet approved or cleared by the [place] FDA and has been authorized for detection and/or diagnosis of SARS-CoV-2 by FDA under an Emergency Use Authorization (EUA). This EUA will remain in effect (meaning this test can be used) for the duration of the COVID-19 declaration under Section 564(b)(1) of the Act, 21 U.S.C. section 360bbb-3(b)(1), unless the authorization is terminated or revoked.  Performed at [facility], [place] [address], [place], [place] [address]      Labs:  COVID-19 Labs   Lab Results  Component Value Date   SARSCOV2NAA NEGATIVE 04/08/2021   SARSCOV2NAA POSITIVE (A) 08/05/2020   [place] NEGATIVE 06/09/2020   [place] NEGATIVE 05/28/2020      Basic Metabolic Panel: Recent Labs  Lab 04/08/21 1940 04/08/21 1953 04/09/21 0515  NA 140 135 138  K 3.7 8.0* 4.3  CL 103 103 105  CO2 28  --  25  GLUCOSE 121* 123* 149*  BUN 17 24* 17  CREATININE 1.27* 1.20 1.31*  CALCIUM 9.3  --  9.3   Liver Function  Tests: Recent Labs  Lab 04/08/21 1940  AST 34  ALT 38  ALKPHOS 90  BILITOT 1.1  PROT 6.5  ALBUMIN 3.6    CBC: Recent Labs  Lab 04/08/21 1940 04/08/21 1953  WBC 11.9*  --   NEUTROABS 8.8*  --   HGB 18.6* 19.4*  HCT 59.1* 57.0*  MCV 90.4  --   PLT 204  --      CBG: Recent Labs  Lab 04/09/21 0832  GLUCAP 145*     IMAGING STUDIES DG CHEST PORT 1 VIEW  Result Date: 04/09/2021 CLINICAL DATA:  Follow-up pneumothorax EXAM: PORTABLE CHEST 1 VIEW COMPARISON:  04/08/2021 FINDINGS: Left pneumothorax on prior CT is not visible radiographically. Low volume chest which accentuates cardiomediastinal size. No new abnormality IMPRESSION: Resolved or occult left pneumothorax. Electronically Signed   By: Jorje Guild M.D.   On: 04/09/2021 07:45   DG Chest Port 1 View  Result Date: 04/08/2021 CLINICAL DATA:  Chest pain. EXAM: PORTABLE CHEST 1 VIEW COMPARISON:  Chest x-ray 01/03/2021. FINDINGS: Cardiac silhouette is enlarged, unchanged. There is questionable mediastinal bulging at the level of the proximal descending thoracic aorta. This may be artifactual secondary to adjacent left upper lobe airspace disease. There is some atelectasis in the left lung base. There is no pleural effusion or pneumothorax. Degenerative changes affect the spine. IMPRESSION: 1. Questionable bulging at the level of the proximal descending thoracic aorta. This is indeterminate and may be related to adjacent left upper  lobe airspace disease, artifact or acute process involving the mediastinum. Recommend follow-up CT chest for further evaluation. Electronically Signed   By: Ronney Asters M.D.   On: 04/08/2021 20:09   CT Angio Chest/Abd/Pel for Dissection W and/or Wo Contrast  Result Date: 04/08/2021 CLINICAL DATA:  Left chest pain with inspiration, widened mediastinum on chest x-ray EXAM: CT ANGIOGRAPHY CHEST, ABDOMEN AND PELVIS TECHNIQUE: Non-contrast CT of the chest was initially obtained. Multidetector CT  imaging through the chest, abdomen and pelvis was performed using the standard protocol during bolus administration of intravenous contrast. Multiplanar reconstructed images and MIPs were obtained and reviewed to evaluate the vascular anatomy. CONTRAST:  139mL OMNIPAQUE IOHEXOL 350 MG/ML SOLN COMPARISON:  11/08/2017, 04/08/2021 FINDINGS: CTA CHEST FINDINGS Cardiovascular: No evidence of thoracic aortic aneurysm or dissection. Diffuse atherosclerosis. There is biatrial dilatation, left greater than right. Calcification of the mitral annulus. No pericardial effusion. No filling defects or pulmonary emboli. Mediastinum/Nodes: No enlarged mediastinal, hilar, or axillary lymph nodes. Thyroid gland, trachea, and esophagus demonstrate no significant findings. Lungs/Pleura: There is a trace left anterior pneumothorax, volume estimated far less than 5%. No airspace disease or effusion. Minimal atelectasis within the left lower lobe. The right chest is clear. Central airways are patent. Musculoskeletal: No acute or destructive bony lesions. Reconstructed images demonstrate no additional findings. Review of the MIP images confirms the above findings. CTA ABDOMEN AND PELVIS FINDINGS VASCULAR Aorta: Normal caliber aorta without aneurysm, dissection, vasculitis or significant stenosis. Diffuse atherosclerosis. Celiac: Patent without evidence of aneurysm, dissection, vasculitis or significant stenosis. Mild atherosclerosis at the origin. SMA: Patent without evidence of aneurysm, dissection, vasculitis or significant stenosis. Mild atherosclerosis at the origin. Renals: Severe atherosclerosis at the origin of the right renal artery. Mild atherosclerosis on the left. No aneurysm, dissection, or vasculitis. IMA: Patent without evidence of aneurysm, dissection, vasculitis or significant stenosis. Inflow: Patent without evidence of aneurysm, dissection, vasculitis or significant stenosis. Veins: No obvious venous abnormality within the  limitations of this arterial phase study. Review of the MIP images confirms the above findings. NON-VASCULAR Hepatobiliary: No focal liver abnormality is seen. Status post cholecystectomy. No biliary dilatation. Pancreas: Unremarkable. No pancreatic ductal dilatation or surrounding inflammatory changes. Spleen: Normal in size without focal abnormality. Adrenals/Urinary Tract: Bilateral renal cortical atrophy. No urinary tract calculi or obstruction. The adrenals and bladder are unremarkable. Stomach/Bowel: No bowel obstruction or ileus. No bowel wall thickening or inflammatory change. Lymphatic: No pathologic adenopathy. Reproductive: Prostate is unremarkable. Other: No free fluid or free gas.  No abdominal wall hernia. Musculoskeletal: No acute or destructive bony lesions. Reconstructed images demonstrate no additional findings. Review of the MIP images confirms the above findings. IMPRESSION: 1. Trace left anterior pneumothorax volume estimated far less than 5%. 2. No evidence of thoracoabdominal aortic aneurysm or dissection. 3. No evidence of pulmonary embolus. 4. No acute intra-abdominal or intrapelvic process. 5.  Aortic Atherosclerosis (ICD10-I70.0). Critical Value/emergent results were called by telephone at the time of interpretation on 04/08/2021 at 9:01 pm to provider DAVID YAO , who verbally acknowledged these results. Electronically Signed   By: Randa Ngo M.D.   On: 04/08/2021 21:06    DISCHARGE EXAMINATION: Vitals:   04/09/21 0227 04/09/21 0630 04/09/21 0930 04/09/21 1000  BP: 135/69 (!) 111/57 (!) 132/117 (!) 150/79  Pulse: 83 84 68 72  Resp: 20 20 (!) 21 16  Temp:      TempSrc:      SpO2: 99% 94% 95% 95%  Weight:      Height:  General appearance: Awake alert.  In no distress Resp: Clear to auscultation bilaterally.  Normal effort Cardio: S1-S2 is normal regular.  No S3-S4.  No rubs murmurs or bruit GI: Abdomen is soft.  Nontender nondistended.  Bowel sounds are present  normal.  No masses organomegaly    DISPOSITION: SNF  Discharge Instructions     Call MD for:  difficulty breathing, headache or visual disturbances   Complete by: As directed    Call MD for:  extreme fatigue   Complete by: As directed    Call MD for:  persistant dizziness or light-headedness   Complete by: As directed    Call MD for:  persistant nausea and vomiting   Complete by: As directed    Call MD for:  severe uncontrolled pain   Complete by: As directed    Call MD for:  temperature >100.4   Complete by: As directed    Diet - low sodium heart healthy   Complete by: As directed    Discharge instructions   Complete by: As directed    Continue oxygen for now.  Reviewed discharge summary for instructions  You were cared for by a hospitalist during your hospital stay. If you have any questions about your discharge medications or the care you received while you were in the hospital after you are discharged, you can call the unit and asked to speak with the hospitalist on call if the hospitalist that took care of you is not available. Once you are discharged, your primary care physician will handle any further medical issues. Please note that NO REFILLS for any discharge medications will be authorized once you are discharged, as it is imperative that you return to your primary care physician (or establish a relationship with a primary care physician if you do not have one) for your aftercare needs so that they can reassess your need for medications and monitor your lab values. If you do not have a primary care physician, you can call [phone] for a physician referral.   Increase activity slowly   Complete by: As directed          Allergies as of 04/09/2021       Reactions   Adhesive [tape] Rash        Medication List     TAKE these medications    acetaminophen 325 MG tablet Commonly known as: TYLENOL Take 650 mg by mouth every 4 (four) hours as needed for moderate pain  (for pain).   albuterol 108 (90 Base) MCG/ACT inhaler Commonly known as: VENTOLIN HFA Inhale 2 puffs into the lungs 2 (two) times daily as needed for wheezing or shortness of breath.   apixaban 5 MG Tabs tablet Commonly known as: ELIQUIS Take 1 tablet (5 mg total) by mouth 2 (two) times daily.   ARTIFICIAL TEARS PF OP Place 1 drop into both eyes 4 (four) times daily.   aspirin 81 MG EC tablet Take 1 tablet (81 mg total) by mouth daily. Swallow whole.   atorvastatin 40 MG tablet Commonly known as: LIPITOR Take 40 mg by mouth at bedtime.   cloNIDine 0.1 MG tablet Commonly known as: CATAPRES Take 0.1 mg by mouth 3 (three) times daily as needed (if systolic blood pressure over 160).   dorzolamide-timolol 22.3-6.8 MG/ML ophthalmic solution Commonly known as: COSOPT Place 1 drop into both eyes 2 (two) times daily.   eucerin cream Apply 1 application topically See admin instructions. Apply a thin layer to affected areas of  the skin 2 times a day for itching   fexofenadine 60 MG tablet Commonly known as: ALLEGRA Take 60 mg by mouth daily.   gabapentin 100 MG capsule Commonly known as: NEURONTIN Take 2 capsules (200 mg total) by mouth at bedtime as needed. What changed: when to take this   hydrALAZINE 25 MG tablet Commonly known as: APRESOLINE Take 12.5 mg by mouth 2 (two) times daily.   hydrALAZINE 10 MG tablet Commonly known as: APRESOLINE Take 10 mg by mouth daily as needed (sbp >160).   latanoprost 0.005 % ophthalmic solution Commonly known as: XALATAN Place 1 drop into both eyes at bedtime.   losartan 25 MG tablet Commonly known as: COZAAR Take 50 mg by mouth daily.   magnesium oxide 400 MG tablet Commonly known as: MAG-OX Take 400 mg by mouth daily.   melatonin 5 MG Tabs Take 5 mg by mouth at bedtime.   metoprolol succinate 25 MG 24 hr tablet Commonly known as: Toprol XL Take 0.5 tablets (12.5 mg total) by mouth daily.   nitroGLYCERIN 0.4 MG SL  tablet Commonly known as: NITROSTAT Place 0.4 mg under the tongue every 5 (five) minutes x 3 doses as needed for chest pain (and CALL PROVIDER IF NO RELIEF AFTER THE 3RD DOSE).   nystatin powder Commonly known as: MYCOSTATIN/NYSTOP Apply 1 application topically 3 (three) times daily. Underneath left breast   polyethylene glycol 17 g packet Commonly known as: MIRALAX / GLYCOLAX Take 17 g by mouth daily.   repaglinide 0.5 MG tablet Commonly known as: PRANDIN Take 0.5 mg by mouth daily.   sennosides-docusate sodium 8.6-50 MG tablet Commonly known as: SENOKOT-S Take 2 tablets by mouth 2 (two) times daily.   tamsulosin 0.4 MG Caps capsule Commonly known as: FLOMAX Take 1 capsule (0.4 mg total) by mouth daily after supper.   Trulicity 3 MN/8.1RR Sopn Generic drug: Dulaglutide Inject 3 mg into the skin every Monday.   Vascepa 1 g capsule Generic drug: icosapent Ethyl Take 2 g by mouth every evening.   vitamin B-12 1000 MCG tablet Commonly known as: CYANOCOBALAMIN Take 500 mcg by mouth daily.   Vitamin D-3 25 MCG (1000 UT) Caps Take 2,000 Units by mouth daily.               Durable Medical Equipment  (From admission, onward)           Start     Ordered   04/09/21 1056  For home use only DME oxygen  Once       Question Answer Comment  Length of Need 6 Months   Mode or (Route) Nasal cannula   Liters per Minute 2   Frequency Continuous (stationary and portable oxygen unit needed)   Oxygen conserving device Yes   Oxygen delivery system Gas      04/09/21 1056               TOTAL DISCHARGE TIME: 35 mins  Asmaa Tirpak [employer] on [email or web address]  04/09/2021, 10:57 AM

## 2021-04-09 NOTE — ED Notes (Signed)
Per Hospitalist, Pt will be discharged back to SNF but needs to be on O2.

## 2021-04-09 NOTE — Progress Notes (Signed)
..  Transition of Care [facility]) - Emergency Department Mini Assessment   Patient Details  Name: Reginald [name] MRN: [id number] Date of Birth: [birth year]  [facility]) CM/SW Contact:    Illene Regulus, LCSW Phone Number: 04/09/2021, 10:58 AM   Clinical Narrative:  Pt is a long-term resident from [facility]. CSW spoke with Star from [facility], she reported pt will be able to d/c back to the facility today. The facility will provide pt with 2L of oxygen upon his arrival. MD and RN were notified.    ED Mini Assessment: What brought you to the Emergency Department? : Chest Pain  Barriers to Discharge: No Barriers Identified     Means of departure: Ambulance       Patient Contact and Communications        ,                 Admission diagnosis:  Pneumothorax [J93.9] Patient Active Problem List   Diagnosis Date Noted   Pneumothorax 04/08/2021   Stroke ([place]) 08/05/2020   Vision loss of right eye 06/09/2020   Acute on chronic renal failure ([place]) 12/19/2018   Acute renal failure superimposed on stage 4 chronic kidney disease ([place]) 12/19/2018   AKI (acute kidney injury) ([place]) 11/26/2018   HCAP (healthcare-associated pneumonia) 11/15/2018   COVID-19 11/15/2018   Chronic venous insufficiency 10/04/2017   Venous stasis ulcers of both lower extremities ([place]) 10/04/2017   Nonspecific chest pain 10/27/2016   Morbid obesity ([place]) 05/02/2016   Lymphedema 05/02/2016   Abnormal laboratory test result 10/15/2015   Cough 10/08/2015   Hematuria 09/21/2015   Hypokalemia 08/13/2015   HTN (hypertension) 08/13/2015   Multiple open wounds of lower leg 07/22/2015   CKD (chronic kidney disease) stage 3, GFR 30-59 ml/min (HCC) 07/22/2015   Cellulitis of leg, right 06/28/2015   ARF (acute renal failure) ([place]) 06/28/2015   Hypertensive heart disease with CHF (congestive heart failure) ([place]) 05/19/2015   CAD (coronary artery disease), native  coronary artery 05/19/2015   Hyperlipidemia 05/19/2015   Vitamin D deficiency 05/19/2015   Pressure ulcer 07/68/0881   Diastolic dysfunction with chronic heart failure ([place]) 05/08/2015   Blisters of multiple sites 05/08/2015   Type 2 diabetes mellitus ([place])    Obesity, unspecified    OSA (obstructive sleep apnea)    Atrial fibrillation ([place])    Secondary DM with CKD stage 4 and hypertension ([place])    Glaucoma    Cellulitis 05/07/2015   PCP:  [name], MD Pharmacy:   [place], [place] [address] Phone: [phone] Fax: [place], [place] - [address] [place] [address] Phone: [phone] Fax: [phone]

## 2021-04-09 NOTE — ED Notes (Signed)
Report, paperwork, and belongings given to PTAR.

## 2021-04-09 NOTE — ED Notes (Signed)
4th Floor made aware that the Pt is being discharged.

## 2021-04-09 NOTE — ED Notes (Signed)
PTAR called for transport.  

## 2021-04-09 NOTE — ED Notes (Signed)
Per Pt and Pt's paperwork,  he is from [facility].

## 2021-04-12 DIAGNOSIS — R278 Other lack of coordination: Secondary | ICD-10-CM | POA: Diagnosis not present

## 2021-04-12 DIAGNOSIS — M6259 Muscle wasting and atrophy, not elsewhere classified, multiple sites: Secondary | ICD-10-CM | POA: Diagnosis not present

## 2021-04-12 DIAGNOSIS — J449 Chronic obstructive pulmonary disease, unspecified: Secondary | ICD-10-CM | POA: Diagnosis not present

## 2021-04-12 DIAGNOSIS — M6281 Muscle weakness (generalized): Secondary | ICD-10-CM | POA: Diagnosis not present

## 2021-04-12 DIAGNOSIS — N184 Chronic kidney disease, stage 4 (severe): Secondary | ICD-10-CM | POA: Diagnosis not present

## 2021-04-12 DIAGNOSIS — I480 Paroxysmal atrial fibrillation: Secondary | ICD-10-CM | POA: Diagnosis not present

## 2021-04-12 DIAGNOSIS — R1312 Dysphagia, oropharyngeal phase: Secondary | ICD-10-CM | POA: Diagnosis not present

## 2021-04-12 DIAGNOSIS — Z9181 History of falling: Secondary | ICD-10-CM | POA: Diagnosis not present

## 2021-04-12 DIAGNOSIS — R2681 Unsteadiness on feet: Secondary | ICD-10-CM | POA: Diagnosis not present

## 2021-04-12 DIAGNOSIS — R2689 Other abnormalities of gait and mobility: Secondary | ICD-10-CM | POA: Diagnosis not present

## 2021-04-12 DIAGNOSIS — R262 Difficulty in walking, not elsewhere classified: Secondary | ICD-10-CM | POA: Diagnosis not present

## 2021-04-12 DIAGNOSIS — G9341 Metabolic encephalopathy: Secondary | ICD-10-CM | POA: Diagnosis not present

## 2021-04-12 DIAGNOSIS — R41841 Cognitive communication deficit: Secondary | ICD-10-CM | POA: Diagnosis not present

## 2021-04-12 DIAGNOSIS — E119 Type 2 diabetes mellitus without complications: Secondary | ICD-10-CM | POA: Diagnosis not present

## 2021-04-12 DIAGNOSIS — N139 Obstructive and reflux uropathy, unspecified: Secondary | ICD-10-CM | POA: Diagnosis not present

## 2021-04-12 DIAGNOSIS — I639 Cerebral infarction, unspecified: Secondary | ICD-10-CM | POA: Diagnosis not present

## 2021-04-13 DIAGNOSIS — I4891 Unspecified atrial fibrillation: Secondary | ICD-10-CM | POA: Diagnosis not present

## 2021-04-13 DIAGNOSIS — J9383 Other pneumothorax: Secondary | ICD-10-CM | POA: Diagnosis not present

## 2021-04-13 DIAGNOSIS — R778 Other specified abnormalities of plasma proteins: Secondary | ICD-10-CM | POA: Diagnosis not present

## 2021-04-13 DIAGNOSIS — J449 Chronic obstructive pulmonary disease, unspecified: Secondary | ICD-10-CM | POA: Diagnosis not present

## 2021-04-14 DIAGNOSIS — R059 Cough, unspecified: Secondary | ICD-10-CM | POA: Diagnosis not present

## 2021-04-14 LAB — I-STAT CHEM 8, ED
BUN: 24 mg/dL — ABNORMAL HIGH (ref 8–23)
Calcium, Ion: 1.03 mmol/L — ABNORMAL LOW (ref 1.15–1.40)
Chloride: 103 mmol/L (ref 98–111)
Creatinine, Ser: 1.2 mg/dL (ref 0.61–1.24)
Glucose, Bld: 123 mg/dL — ABNORMAL HIGH (ref 70–99)
HCT: 57 % — ABNORMAL HIGH (ref 39.0–52.0)
Hemoglobin: 19.4 g/dL — ABNORMAL HIGH (ref 13.0–17.0)
Potassium: 8 mmol/L (ref 3.5–5.1)
Sodium: 135 mmol/L (ref 135–145)
TCO2: 31 mmol/L (ref 22–32)

## 2021-04-22 ENCOUNTER — Encounter: Payer: Self-pay | Admitting: Physician Assistant

## 2021-04-22 ENCOUNTER — Ambulatory Visit (HOSPITAL_COMMUNITY)
Admission: RE | Admit: 2021-04-22 | Discharge: 2021-04-22 | Disposition: A | Discharge status: Home / Self Care | Payer: Medicare Other | Source: Ambulatory Visit | Attending: Vascular Surgery | Admitting: Vascular Surgery

## 2021-04-22 ENCOUNTER — Ambulatory Visit (INDEPENDENT_AMBULATORY_CARE_PROVIDER_SITE_OTHER): Payer: Medicare Other | Admitting: Physician Assistant

## 2021-04-22 ENCOUNTER — Other Ambulatory Visit: Payer: Self-pay

## 2021-04-22 VITALS — BP 116/70 | HR 79 | Temp 97.5°F | Ht 71.0 in | Wt 275.0 lb

## 2021-04-22 DIAGNOSIS — I6521 Occlusion and stenosis of right carotid artery: Secondary | ICD-10-CM | POA: Insufficient documentation

## 2021-04-22 DIAGNOSIS — I6523 Occlusion and stenosis of bilateral carotid arteries: Secondary | ICD-10-CM | POA: Diagnosis not present

## 2021-04-22 DIAGNOSIS — M7989 Other specified soft tissue disorders: Secondary | ICD-10-CM | POA: Diagnosis not present

## 2021-04-22 DIAGNOSIS — I639 Cerebral infarction, unspecified: Secondary | ICD-10-CM

## 2021-04-22 NOTE — Progress Notes (Signed)
HISTORY AND PHYSICAL     CC:  follow up. Requesting Provider:  [name], MD  HPI: This is a 85 y.o. male here for follow up for carotid artery stenosis.  Pt is s/p right TCAR for symptomatic carotid artery stenosis with central retinal artery occlusion on 06/12/2020 by Dr. Trula Slade.    Pt was last seen 07/13/2020 and at that time pt was doing well from surgery but still had not recovered vision in his right eye.   Pt returns today for follow up.    Pt denies any amaurosis fugax, speech difficulties, weakness, numbness, paralysis or clumsiness or facial droop.  He did not recover any vision in the right eye.  He states he had trouble standing this morning due to left leg weakness as he sleeps with his legs elevated on pillows and says the way they stacked the pillows weakened him.    Pt also has hx of chronic leg swelling and was seen by Dr. Oneida Alar in the past who recommended lifelong compression with either unna boots or compression socks as he has hx of venous ulcers.  He did have laser ablation of the bilateral GSV in 2019 by Dr. Oneida Alar.   He continues to have swollen legs.  He has not been in compression.  He states they stopped putting compression on his legs.    The pt is on a statin for cholesterol management.  The pt is on a daily aspirin.   Other AC:  eliquis for afib The pt is on Catapres, BB, ARB, hydralazine for hypertension.   The pt is diabetic.   Tobacco hx:  never    Past Medical History:  Diagnosis Date   Anteroseptal myocardial infarction [facility])    Carotid artery occlusion    CHF (congestive heart failure) (HCC)    Chronic kidney disease    COPD (chronic obstructive pulmonary disease) (HCC)    Coronary atherosclerosis of unspecified type of vessel, native or graft    a. s/p prior PCI to LAD in 1997   Diverticulosis    ED (erectile dysfunction)    Foley catheter in place    Glaucoma    Hematuria    History of 2019 novel coronavirus disease (COVID-19)  11/2018   Hydronephrosis, bilateral    Hydroureter    Hypercholesteremia    Hypertensive renal disease    Hypogonadism male    Hypokalemia    Obesity hypoventilation syndrome (HCC)    Obesity, unspecified    OSA (obstructive sleep apnea)    Peripheral vascular disease (HCC)    Permanent atrial fibrillation ([place])    Phimosis    Pneumonia    Rhabdomyolysis    Stroke ([place])    Tremor    Type II or unspecified type diabetes mellitus without mention of complication, not stated as uncontrolled    Unspecified essential hypertension    Urinary incontinence    Urinary retention    Vestibulopathy     Past Surgical History:  Procedure Laterality Date   APPENDECTOMY  1985   CARDIAC CATHETERIZATION     CHOLECYSTECTOMY  06/2000   CIRCUMCISION N/A 05/28/2020   Procedure: CIRCUMCISION ADULT;  Surgeon: Franchot Gallo, MD;  Location: [facility];  Service: Urology;  Laterality: N/A;   CORONARY ANGIOPLASTY  08/14/1995   stent placement to LAD    DORSAL SLIT N/A 05/28/2020   Procedure: DORSAL SLIT;  Surgeon: Franchot Gallo, MD;  Location: [facility];  Service: Urology;  Laterality: N/A;  45 MINS  ENDOVENOUS ABLATION SAPHENOUS VEIN W/ LASER Left 02/21/2018   endovenous laser ablation L GSV by Ruta Hinds MD    INGUINAL HERNIA REPAIR Right 1985   KNEE ARTHROSCOPY Left 1991   LUMBAR FUSION     NOSE SURGERY  1970s   Per Dr. Terrence Dupont [facility] in pt chart   TRANSCAROTID ARTERY REVASCULARIZATION  Right 06/12/2020   Procedure: RIGHT TRANSCAROTID ARTERY REVASCULARIZATION;  Surgeon: Serafina Mitchell, MD;  Location: [place];  Service: Vascular;  Laterality: Right;    Allergies  Allergen Reactions   Adhesive [Tape] Rash    Current Outpatient Medications  Medication Sig Dispense Refill   acetaminophen (TYLENOL) 325 MG tablet Take 650 mg by mouth every 4 (four) hours as needed for moderate pain (for pain).     albuterol (VENTOLIN HFA) 108 (90 Base) MCG/ACT inhaler Inhale 2 puffs into the lungs 2 (two)  times daily as needed for wheezing or shortness of breath.     apixaban (ELIQUIS) 5 MG TABS tablet Take 1 tablet (5 mg total) by mouth 2 (two) times daily. 60 tablet 0   aspirin EC 81 MG EC tablet Take 1 tablet (81 mg total) by mouth daily. Swallow whole. 30 tablet 11   atorvastatin (LIPITOR) 40 MG tablet Take 40 mg by mouth at bedtime.     Cholecalciferol (VITAMIN D-3) 25 MCG (1000 UT) CAPS Take 2,000 Units by mouth daily.     cloNIDine (CATAPRES) 0.1 MG tablet Take 0.1 mg by mouth 3 (three) times daily as needed (if systolic blood pressure over 160).     Dextran 70-Hypromellose (ARTIFICIAL TEARS PF OP) Place 1 drop into both eyes 4 (four) times daily.     dorzolamide-timolol (COSOPT) 22.3-6.8 MG/ML ophthalmic solution Place 1 drop into both eyes 2 (two) times daily.     Dulaglutide (TRULICITY) 3 WR/6.0AV SOPN Inject 3 mg into the skin every Monday.     fexofenadine (ALLEGRA) 60 MG tablet Take 60 mg by mouth daily.     gabapentin (NEURONTIN) 100 MG capsule Take 2 capsules (200 mg total) by mouth at bedtime as needed. (Patient taking differently: Take 200 mg by mouth at bedtime.)     hydrALAZINE (APRESOLINE) 10 MG tablet Take 10 mg by mouth daily as needed (sbp >160).     hydrALAZINE (APRESOLINE) 25 MG tablet Take 12.5 mg by mouth 2 (two) times daily.     latanoprost (XALATAN) 0.005 % ophthalmic solution Place 1 drop into both eyes at bedtime.     losartan (COZAAR) 25 MG tablet Take 50 mg by mouth daily.     magnesium oxide (MAG-OX) 400 MG tablet Take 400 mg by mouth daily.     Melatonin 5 MG TABS Take 5 mg by mouth at bedtime.     metoprolol succinate (TOPROL XL) 25 MG 24 hr tablet Take 0.5 tablets (12.5 mg total) by mouth daily. 15 tablet 11   nitroGLYCERIN (NITROSTAT) 0.4 MG SL tablet Place 0.4 mg under the tongue every 5 (five) minutes x 3 doses as needed for chest pain (and CALL PROVIDER IF NO RELIEF AFTER THE 3RD DOSE).     nystatin (MYCOSTATIN/NYSTOP) powder Apply 1 application topically 3  (three) times daily. Underneath left breast     polyethylene glycol (MIRALAX / GLYCOLAX) 17 g packet Take 17 g by mouth daily.     repaglinide (PRANDIN) 0.5 MG tablet Take 0.5 mg by mouth daily.     sennosides-docusate sodium (SENOKOT-S) 8.6-50 MG tablet Take 2 tablets by mouth 2 (  two) times daily.     Skin Protectants, Misc. (EUCERIN) cream Apply 1 application topically See admin instructions. Apply a thin layer to affected areas of the skin 2 times a day for itching     tamsulosin (FLOMAX) 0.4 MG CAPS capsule Take 1 capsule (0.4 mg total) by mouth daily after supper. 30 capsule 0   VASCEPA 1 g capsule Take 2 g by mouth every evening.     vitamin B-12 (CYANOCOBALAMIN) 1000 MCG tablet Take 500 mcg by mouth daily.     No current facility-administered medications for this visit.    Family History  Problem Relation Age of Onset   COPD Mother    Heart disease Mother    Diabetes Father    Heart disease Father    Diabetes Sister    Heart disease Brother    Heart disease Brother    Breast cancer Maternal Aunt    Diabetes Paternal Grandmother    Colon cancer Maternal Aunt    Ovarian cancer Daughter    Glaucoma Other     Social History   Socioeconomic History   Marital status: Single    Spouse name: Not on file   Number of children: 5   Years of education: Not on file   Highest education level: Not on file  Occupational History   Occupation: retired  Tobacco Use   Smoking status: Never   Smokeless tobacco: Never  Vaping Use   Vaping Use: Never used  Substance and Sexual Activity   Alcohol use: No    Alcohol/week: 0.0 standard drinks   Drug use: No   Sexual activity: Never  Other Topics Concern   Not on file  Social History Narrative   02/17/21 lives at [facility]   Social Determinants of Health   Financial Resource Strain: Not on file  Food Insecurity: Not on file  Transportation Needs: Not on file  Physical Activity: Not on file  Stress: Not on file  Social  Connections: Not on file  Intimate Partner Violence: Not on file     REVIEW OF SYSTEMS:   [X]  denotes positive finding, [ ]  denotes negative finding Cardiac  Comments:  Chest pain or chest pressure:    Shortness of breath upon exertion:    Short of breath when lying flat:    Irregular heart rhythm:        Vascular    Pain in calf, thigh, or hip brought on by ambulation:    Pain in feet at night that wakes you up from your sleep:     Blood clot in your veins:    Leg swelling:  x       Pulmonary    Oxygen at home:    Productive cough:     Wheezing:         Neurologic    Sudden weakness in arms or legs:     Sudden numbness in arms or legs:     Sudden onset of difficulty speaking or slurred speech:    Temporary loss of vision in one eye:     Problems with dizziness:         Gastrointestinal    Blood in stool:     Vomited blood:         Genitourinary    Burning when urinating:     Blood in urine:        Psychiatric    Major depression:         Hematologic  Bleeding problems:    Problems with blood clotting too easily:        Skin    Rashes or ulcers:        Constitutional    Fever or chills:      PHYSICAL EXAMINATION:  Today's Vitals   04/22/21 1249  BP: 116/70  Pulse: 79  Temp: (!) 97.5 F (36.4 C)  TempSrc: Temporal  Weight: 275 lb (124.7 kg)  Height: 5\' 11"  (1.803 m)   Body mass index is 38.35 kg/m.   General:  WDWN in NAD; vital signs documented above Gait: Not observed HENT: WNL, normocephalic Pulmonary: normal non-labored breathing Cardiac: regular HR, without carotid bruits Abdomen: obese Skin: without rashes Vascular Exam/Pulses:  Right Left  Radial 2+ (normal) 2+ (normal)  DP 2+ (normal) 2+ (normal)  PT Unable to palpate Unable to palpate   Extremities: +pitting edema BLE; no open ulcers Musculoskeletal: no muscle wasting or atrophy  Neurologic: A&O X 3; moving all extremities equally; speech is fluent/normal Psychiatric:   The pt has Normal affect.   Non-Invasive Vascular Imaging:   Carotid Duplex on 04/22/2021: Right Carotid: Patent stent with no visualized stenosis..  Left Carotid: Velocities in the left ICA are consistent with a 1-39%  stenosis.   Vertebrals:  Bilateral vertebral arteries demonstrate antegrade flow.  Subclavians: Normal flow hemodynamics were seen in bilateral subclavian arteries.  Previous Carotid duplex on 07/13/2020: Right: no evidence of stenosis Left:   1-39% ICA stenosis Vertebrals:  Bilateral vertebral arteries demonstrate antegrade flow.  Subclavians: Normal flow hemodynamics were seen in bilateral subclavian arteries.    ASSESSMENT/PLAN:: 85 y.o. male here for follow up carotid artery stenosis and is s/p right TCAR for symptomatic carotid artery stenosis on 06/12/2020 by Dr. Trula Slade.  -duplex today reveals right carotid stent is patent without stenosis and left ICA is 1-39% -discussed s/s of stroke with pt and he understands should he develop any of these sx, he will go to the nearest ER or call 911. -pt will f/u in one year with carotid duplex -pt will call sooner should they have any issues. -continue statin/asa   -pt with hx of chronic venous insufficiency with hx of GSV laser ablation.  Pt has not been in compression lately.  We did put 15-73mmHg knee high compression socks on him today.  These should be put on in the morning and take off at night.  Continue leg elevation to help with swelling.    Leontine Locket, [facility] [phone]  Clinic MD:  Virl Cagey on call MD

## 2021-04-27 DIAGNOSIS — I4891 Unspecified atrial fibrillation: Secondary | ICD-10-CM | POA: Diagnosis not present

## 2021-04-27 DIAGNOSIS — I2510 Atherosclerotic heart disease of native coronary artery without angina pectoris: Secondary | ICD-10-CM | POA: Diagnosis not present

## 2021-04-27 DIAGNOSIS — L304 Erythema intertrigo: Secondary | ICD-10-CM | POA: Diagnosis not present

## 2021-04-27 DIAGNOSIS — I639 Cerebral infarction, unspecified: Secondary | ICD-10-CM | POA: Diagnosis not present

## 2021-04-27 DIAGNOSIS — I5032 Chronic diastolic (congestive) heart failure: Secondary | ICD-10-CM | POA: Diagnosis not present

## 2021-04-27 DIAGNOSIS — E1159 Type 2 diabetes mellitus with other circulatory complications: Secondary | ICD-10-CM | POA: Diagnosis not present

## 2021-04-27 DIAGNOSIS — N183 Chronic kidney disease, stage 3 unspecified: Secondary | ICD-10-CM | POA: Diagnosis not present

## 2021-04-27 DIAGNOSIS — I152 Hypertension secondary to endocrine disorders: Secondary | ICD-10-CM | POA: Diagnosis not present

## 2021-04-27 DIAGNOSIS — F0390 Unspecified dementia without behavioral disturbance: Secondary | ICD-10-CM | POA: Diagnosis not present

## 2021-04-27 DIAGNOSIS — E662 Morbid (severe) obesity with alveolar hypoventilation: Secondary | ICD-10-CM | POA: Diagnosis not present

## 2021-04-27 DIAGNOSIS — I872 Venous insufficiency (chronic) (peripheral): Secondary | ICD-10-CM | POA: Diagnosis not present

## 2021-05-04 DIAGNOSIS — Z23 Encounter for immunization: Secondary | ICD-10-CM | POA: Diagnosis not present

## 2021-05-11 DIAGNOSIS — R278 Other lack of coordination: Secondary | ICD-10-CM | POA: Diagnosis not present

## 2021-05-11 DIAGNOSIS — R2689 Other abnormalities of gait and mobility: Secondary | ICD-10-CM | POA: Diagnosis not present

## 2021-05-11 DIAGNOSIS — Z9181 History of falling: Secondary | ICD-10-CM | POA: Diagnosis not present

## 2021-05-11 DIAGNOSIS — E119 Type 2 diabetes mellitus without complications: Secondary | ICD-10-CM | POA: Diagnosis not present

## 2021-05-11 DIAGNOSIS — M6259 Muscle wasting and atrophy, not elsewhere classified, multiple sites: Secondary | ICD-10-CM | POA: Diagnosis not present

## 2021-05-11 DIAGNOSIS — R1312 Dysphagia, oropharyngeal phase: Secondary | ICD-10-CM | POA: Diagnosis not present

## 2021-05-11 DIAGNOSIS — R41841 Cognitive communication deficit: Secondary | ICD-10-CM | POA: Diagnosis not present

## 2021-05-11 DIAGNOSIS — I639 Cerebral infarction, unspecified: Secondary | ICD-10-CM | POA: Diagnosis not present

## 2021-05-11 DIAGNOSIS — R2681 Unsteadiness on feet: Secondary | ICD-10-CM | POA: Diagnosis not present

## 2021-05-11 DIAGNOSIS — M6281 Muscle weakness (generalized): Secondary | ICD-10-CM | POA: Diagnosis not present

## 2021-05-11 DIAGNOSIS — N184 Chronic kidney disease, stage 4 (severe): Secondary | ICD-10-CM | POA: Diagnosis not present

## 2021-05-11 DIAGNOSIS — G9341 Metabolic encephalopathy: Secondary | ICD-10-CM | POA: Diagnosis not present

## 2021-05-11 DIAGNOSIS — I480 Paroxysmal atrial fibrillation: Secondary | ICD-10-CM | POA: Diagnosis not present

## 2021-05-11 DIAGNOSIS — R262 Difficulty in walking, not elsewhere classified: Secondary | ICD-10-CM | POA: Diagnosis not present

## 2021-05-11 DIAGNOSIS — J449 Chronic obstructive pulmonary disease, unspecified: Secondary | ICD-10-CM | POA: Diagnosis not present

## 2021-05-11 DIAGNOSIS — N139 Obstructive and reflux uropathy, unspecified: Secondary | ICD-10-CM | POA: Diagnosis not present

## 2021-05-20 DIAGNOSIS — M1990 Unspecified osteoarthritis, unspecified site: Secondary | ICD-10-CM | POA: Diagnosis not present

## 2021-05-20 DIAGNOSIS — E1159 Type 2 diabetes mellitus with other circulatory complications: Secondary | ICD-10-CM | POA: Diagnosis not present

## 2021-05-20 DIAGNOSIS — L304 Erythema intertrigo: Secondary | ICD-10-CM | POA: Diagnosis not present

## 2021-06-08 DIAGNOSIS — E119 Type 2 diabetes mellitus without complications: Secondary | ICD-10-CM | POA: Diagnosis not present

## 2021-06-08 DIAGNOSIS — Z8673 Personal history of transient ischemic attack (TIA), and cerebral infarction without residual deficits: Secondary | ICD-10-CM | POA: Diagnosis not present

## 2021-06-08 DIAGNOSIS — R600 Localized edema: Secondary | ICD-10-CM | POA: Diagnosis not present

## 2021-06-08 DIAGNOSIS — I10 Essential (primary) hypertension: Secondary | ICD-10-CM | POA: Diagnosis not present

## 2021-06-08 DIAGNOSIS — E0841 Diabetes mellitus due to underlying condition with diabetic mononeuropathy: Secondary | ICD-10-CM | POA: Diagnosis not present

## 2021-06-08 DIAGNOSIS — D6869 Other thrombophilia: Secondary | ICD-10-CM | POA: Diagnosis not present

## 2021-06-10 DIAGNOSIS — R2681 Unsteadiness on feet: Secondary | ICD-10-CM | POA: Diagnosis not present

## 2021-06-10 DIAGNOSIS — N139 Obstructive and reflux uropathy, unspecified: Secondary | ICD-10-CM | POA: Diagnosis not present

## 2021-06-10 DIAGNOSIS — M6281 Muscle weakness (generalized): Secondary | ICD-10-CM | POA: Diagnosis not present

## 2021-06-10 DIAGNOSIS — R1312 Dysphagia, oropharyngeal phase: Secondary | ICD-10-CM | POA: Diagnosis not present

## 2021-06-10 DIAGNOSIS — R278 Other lack of coordination: Secondary | ICD-10-CM | POA: Diagnosis not present

## 2021-06-10 DIAGNOSIS — Z9181 History of falling: Secondary | ICD-10-CM | POA: Diagnosis not present

## 2021-06-10 DIAGNOSIS — I480 Paroxysmal atrial fibrillation: Secondary | ICD-10-CM | POA: Diagnosis not present

## 2021-06-10 DIAGNOSIS — G9341 Metabolic encephalopathy: Secondary | ICD-10-CM | POA: Diagnosis not present

## 2021-06-10 DIAGNOSIS — E119 Type 2 diabetes mellitus without complications: Secondary | ICD-10-CM | POA: Diagnosis not present

## 2021-06-10 DIAGNOSIS — R262 Difficulty in walking, not elsewhere classified: Secondary | ICD-10-CM | POA: Diagnosis not present

## 2021-06-10 DIAGNOSIS — R41841 Cognitive communication deficit: Secondary | ICD-10-CM | POA: Diagnosis not present

## 2021-06-10 DIAGNOSIS — R2689 Other abnormalities of gait and mobility: Secondary | ICD-10-CM | POA: Diagnosis not present

## 2021-06-10 DIAGNOSIS — J449 Chronic obstructive pulmonary disease, unspecified: Secondary | ICD-10-CM | POA: Diagnosis not present

## 2021-06-10 DIAGNOSIS — I639 Cerebral infarction, unspecified: Secondary | ICD-10-CM | POA: Diagnosis not present

## 2021-06-10 DIAGNOSIS — N184 Chronic kidney disease, stage 4 (severe): Secondary | ICD-10-CM | POA: Diagnosis not present

## 2021-06-15 DIAGNOSIS — E662 Morbid (severe) obesity with alveolar hypoventilation: Secondary | ICD-10-CM | POA: Diagnosis not present

## 2021-06-15 DIAGNOSIS — Z91119 Patient's noncompliance with dietary regimen due to unspecified reason: Secondary | ICD-10-CM | POA: Diagnosis not present

## 2021-06-15 DIAGNOSIS — R634 Abnormal weight loss: Secondary | ICD-10-CM | POA: Diagnosis not present

## 2021-06-15 DIAGNOSIS — R222 Localized swelling, mass and lump, trunk: Secondary | ICD-10-CM | POA: Diagnosis not present

## 2021-06-15 DIAGNOSIS — E119 Type 2 diabetes mellitus without complications: Secondary | ICD-10-CM | POA: Diagnosis not present

## 2021-06-23 DIAGNOSIS — H2513 Age-related nuclear cataract, bilateral: Secondary | ICD-10-CM | POA: Diagnosis not present

## 2021-06-23 DIAGNOSIS — H3411 Central retinal artery occlusion, right eye: Secondary | ICD-10-CM | POA: Diagnosis not present

## 2021-06-23 DIAGNOSIS — H401134 Primary open-angle glaucoma, bilateral, indeterminate stage: Secondary | ICD-10-CM | POA: Diagnosis not present

## 2021-06-23 DIAGNOSIS — H1820 Unspecified corneal edema: Secondary | ICD-10-CM | POA: Diagnosis not present

## 2021-07-01 ENCOUNTER — Encounter (HOSPITAL_COMMUNITY): Payer: Self-pay

## 2021-07-01 ENCOUNTER — Emergency Department (HOSPITAL_COMMUNITY): Payer: Medicare Other

## 2021-07-01 ENCOUNTER — Observation Stay (HOSPITAL_COMMUNITY)
Admission: EM | Admit: 2021-07-01 | Discharge: 2021-07-04 | Disposition: A | Discharge status: Home / Self Care | Payer: Medicare Other | Attending: Internal Medicine | Admitting: Internal Medicine

## 2021-07-01 ENCOUNTER — Other Ambulatory Visit: Payer: Self-pay

## 2021-07-01 DIAGNOSIS — Z8673 Personal history of transient ischemic attack (TIA), and cerebral infarction without residual deficits: Secondary | ICD-10-CM | POA: Diagnosis not present

## 2021-07-01 DIAGNOSIS — E1122 Type 2 diabetes mellitus with diabetic chronic kidney disease: Secondary | ICD-10-CM | POA: Diagnosis not present

## 2021-07-01 DIAGNOSIS — Z7901 Long term (current) use of anticoagulants: Secondary | ICD-10-CM | POA: Insufficient documentation

## 2021-07-01 DIAGNOSIS — N179 Acute kidney failure, unspecified: Secondary | ICD-10-CM | POA: Diagnosis present

## 2021-07-01 DIAGNOSIS — R000 Tachycardia, unspecified: Secondary | ICD-10-CM | POA: Diagnosis not present

## 2021-07-01 DIAGNOSIS — Z7982 Long term (current) use of aspirin: Secondary | ICD-10-CM | POA: Insufficient documentation

## 2021-07-01 DIAGNOSIS — E785 Hyperlipidemia, unspecified: Secondary | ICD-10-CM | POA: Diagnosis present

## 2021-07-01 DIAGNOSIS — J449 Chronic obstructive pulmonary disease, unspecified: Secondary | ICD-10-CM | POA: Diagnosis not present

## 2021-07-01 DIAGNOSIS — E119 Type 2 diabetes mellitus without complications: Secondary | ICD-10-CM

## 2021-07-01 DIAGNOSIS — G4733 Obstructive sleep apnea (adult) (pediatric): Secondary | ICD-10-CM | POA: Diagnosis present

## 2021-07-01 DIAGNOSIS — R0902 Hypoxemia: Secondary | ICD-10-CM | POA: Diagnosis not present

## 2021-07-01 DIAGNOSIS — I4891 Unspecified atrial fibrillation: Secondary | ICD-10-CM | POA: Diagnosis not present

## 2021-07-01 DIAGNOSIS — R0789 Other chest pain: Principal | ICD-10-CM | POA: Insufficient documentation

## 2021-07-01 DIAGNOSIS — Z8616 Personal history of COVID-19: Secondary | ICD-10-CM | POA: Diagnosis not present

## 2021-07-01 DIAGNOSIS — I4821 Permanent atrial fibrillation: Secondary | ICD-10-CM | POA: Diagnosis not present

## 2021-07-01 DIAGNOSIS — Z79899 Other long term (current) drug therapy: Secondary | ICD-10-CM | POA: Insufficient documentation

## 2021-07-01 DIAGNOSIS — I509 Heart failure, unspecified: Secondary | ICD-10-CM | POA: Diagnosis not present

## 2021-07-01 DIAGNOSIS — E1159 Type 2 diabetes mellitus with other circulatory complications: Secondary | ICD-10-CM | POA: Diagnosis present

## 2021-07-01 DIAGNOSIS — R079 Chest pain, unspecified: Secondary | ICD-10-CM | POA: Diagnosis not present

## 2021-07-01 DIAGNOSIS — I2510 Atherosclerotic heart disease of native coronary artery without angina pectoris: Secondary | ICD-10-CM | POA: Diagnosis present

## 2021-07-01 DIAGNOSIS — I130 Hypertensive heart and chronic kidney disease with heart failure and stage 1 through stage 4 chronic kidney disease, or unspecified chronic kidney disease: Secondary | ICD-10-CM | POA: Insufficient documentation

## 2021-07-01 DIAGNOSIS — R0989 Other specified symptoms and signs involving the circulatory and respiratory systems: Secondary | ICD-10-CM | POA: Diagnosis not present

## 2021-07-01 DIAGNOSIS — E1169 Type 2 diabetes mellitus with other specified complication: Secondary | ICD-10-CM | POA: Diagnosis present

## 2021-07-01 DIAGNOSIS — H401134 Primary open-angle glaucoma, bilateral, indeterminate stage: Secondary | ICD-10-CM | POA: Diagnosis not present

## 2021-07-01 DIAGNOSIS — I6930 Unspecified sequelae of cerebral infarction: Secondary | ICD-10-CM

## 2021-07-01 DIAGNOSIS — Z20822 Contact with and (suspected) exposure to covid-19: Secondary | ICD-10-CM | POA: Diagnosis not present

## 2021-07-01 DIAGNOSIS — N184 Chronic kidney disease, stage 4 (severe): Secondary | ICD-10-CM | POA: Insufficient documentation

## 2021-07-01 LAB — CBC
HCT: 61.1 % — ABNORMAL HIGH (ref 39.0–52.0)
Hemoglobin: 18.8 g/dL — ABNORMAL HIGH (ref 13.0–17.0)
MCH: 27.8 pg (ref 26.0–34.0)
MCHC: 30.8 g/dL (ref 30.0–36.0)
MCV: 90.3 fL (ref 80.0–100.0)
Platelets: 206 10*3/uL (ref 150–400)
RBC: 6.77 MIL/uL — ABNORMAL HIGH (ref 4.22–5.81)
RDW: 16.6 % — ABNORMAL HIGH (ref 11.5–15.5)
WBC: 12.4 10*3/uL — ABNORMAL HIGH (ref 4.0–10.5)
nRBC: 0 % (ref 0.0–0.2)

## 2021-07-01 LAB — BASIC METABOLIC PANEL
Anion gap: 10 (ref 5–15)
BUN: 18 mg/dL (ref 8–23)
CO2: 28 mmol/L (ref 22–32)
Calcium: 10.1 mg/dL (ref 8.9–10.3)
Chloride: 102 mmol/L (ref 98–111)
Creatinine, Ser: 1.78 mg/dL — ABNORMAL HIGH (ref 0.61–1.24)
GFR, Estimated: 37 mL/min — ABNORMAL LOW (ref >60)
Glucose, Bld: 156 mg/dL — ABNORMAL HIGH (ref 70–99)
Potassium: 4.6 mmol/L (ref 3.5–5.1)
Sodium: 140 mmol/L (ref 135–145)

## 2021-07-01 LAB — RESP PANEL BY RT-PCR (FLU A&B, COVID) ARPGX2
Influenza A by PCR: NEGATIVE
Influenza B by PCR: NEGATIVE
SARS Coronavirus 2 by RT PCR: NEGATIVE

## 2021-07-01 LAB — GLUCOSE, CAPILLARY: Glucose-Capillary: 136 mg/dL — ABNORMAL HIGH (ref 70–99)

## 2021-07-01 LAB — TROPONIN I (HIGH SENSITIVITY)
Troponin I (High Sensitivity): 23 ng/L — ABNORMAL HIGH (ref <18)
Troponin I (High Sensitivity): 20 ng/L — ABNORMAL HIGH (ref <18)

## 2021-07-01 MED ORDER — ACETAMINOPHEN 650 MG RE SUPP: 650.0000 mg | Freq: Four times a day (QID) | RECTAL | Status: DC | PRN

## 2021-07-01 MED ORDER — GABAPENTIN 100 MG PO CAPS
200.0000 mg | ORAL_CAPSULE | Freq: Every day | ORAL | Status: DC
  Administered 2021-07-01 – 2021-07-03 (×3): 200 mg via ORAL
  Filled 2021-07-01 (×3): qty 2

## 2021-07-01 MED ORDER — ONDANSETRON HCL 4 MG/2ML IJ SOLN: 4.0000 mg | Freq: Four times a day (QID) | INTRAMUSCULAR | Status: DC | PRN

## 2021-07-01 MED ORDER — APIXABAN 5 MG PO TABS: 5.0000 mg | ORAL_TABLET | Freq: Two times a day (BID) | ORAL | Status: DC

## 2021-07-01 MED ORDER — SODIUM CHLORIDE 0.9 % IV SOLN: INTRAVENOUS | Status: DC

## 2021-07-01 MED ORDER — DORZOLAMIDE HCL-TIMOLOL MAL 2-0.5 % OP SOLN
1.0000 [drp] | Freq: Two times a day (BID) | OPHTHALMIC | Status: DC
  Administered 2021-07-01 – 2021-07-03 (×5): 1 [drp] via OPHTHALMIC
  Filled 2021-07-01: qty 10

## 2021-07-01 MED ORDER — ATORVASTATIN CALCIUM 40 MG PO TABS
40.0000 mg | ORAL_TABLET | Freq: Every day | ORAL | Status: DC
  Administered 2021-07-01 – 2021-07-03 (×3): 40 mg via ORAL
  Filled 2021-07-01 (×3): qty 1

## 2021-07-01 MED ORDER — ASPIRIN EC 81 MG PO TBEC
81.0000 mg | DELAYED_RELEASE_TABLET | Freq: Every day | ORAL | Status: DC
  Administered 2021-07-02 – 2021-07-03 (×2): 81 mg via ORAL
  Filled 2021-07-01 (×2): qty 1

## 2021-07-01 MED ORDER — MELATONIN 5 MG PO TABS
5.0000 mg | ORAL_TABLET | Freq: Every day | ORAL | Status: DC
  Administered 2021-07-01 – 2021-07-03 (×3): 5 mg via ORAL
  Filled 2021-07-01 (×4): qty 1

## 2021-07-01 MED ORDER — SENNOSIDES-DOCUSATE SODIUM 8.6-50 MG PO TABS: 1.0000 | ORAL_TABLET | Freq: Every evening | ORAL | Status: DC | PRN

## 2021-07-01 MED ORDER — INSULIN ASPART 100 UNIT/ML IJ SOLN
0.0000 [IU] | Freq: Three times a day (TID) | INTRAMUSCULAR | Status: DC
  Administered 2021-07-02 – 2021-07-03 (×4): 1 [IU] via SUBCUTANEOUS

## 2021-07-01 MED ORDER — APIXABAN 2.5 MG PO TABS
2.5000 mg | ORAL_TABLET | Freq: Two times a day (BID) | ORAL | Status: DC
  Administered 2021-07-01 – 2021-07-02 (×2): 2.5 mg via ORAL
  Filled 2021-07-01 (×2): qty 1

## 2021-07-01 MED ORDER — METOPROLOL SUCCINATE ER 25 MG PO TB24
12.5000 mg | ORAL_TABLET | Freq: Every day | ORAL | Status: DC
  Administered 2021-07-02 – 2021-07-03 (×2): 12.5 mg via ORAL
  Filled 2021-07-01 (×2): qty 1

## 2021-07-01 MED ORDER — ICOSAPENT ETHYL 1 G PO CAPS
2.0000 g | ORAL_CAPSULE | Freq: Every evening | ORAL | Status: DC
  Administered 2021-07-01 – 2021-07-03 (×3): 2 g via ORAL
  Filled 2021-07-01 (×3): qty 2

## 2021-07-01 MED ORDER — ALBUTEROL SULFATE (2.5 MG/3ML) 0.083% IN NEBU: 2.5000 mg | INHALATION_SOLUTION | Freq: Two times a day (BID) | RESPIRATORY_TRACT | Status: DC | PRN

## 2021-07-01 MED ORDER — LATANOPROST 0.005 % OP SOLN
1.0000 [drp] | Freq: Every day | OPHTHALMIC | Status: DC
  Administered 2021-07-01 – 2021-07-03 (×3): 1 [drp] via OPHTHALMIC
  Filled 2021-07-01: qty 2.5

## 2021-07-01 MED ORDER — ONDANSETRON HCL 4 MG PO TABS: 4.0000 mg | ORAL_TABLET | Freq: Four times a day (QID) | ORAL | Status: DC | PRN

## 2021-07-01 MED ORDER — NITROGLYCERIN 0.4 MG SL SUBL: 0.4000 mg | SUBLINGUAL_TABLET | SUBLINGUAL | Status: DC | PRN

## 2021-07-01 MED ORDER — ACETAMINOPHEN 325 MG PO TABS: 650.0000 mg | ORAL_TABLET | Freq: Four times a day (QID) | ORAL | Status: DC | PRN

## 2021-07-01 MED ORDER — TAMSULOSIN HCL 0.4 MG PO CAPS
0.4000 mg | ORAL_CAPSULE | Freq: Every day | ORAL | Status: DC
  Administered 2021-07-01 – 2021-07-03 (×3): 0.4 mg via ORAL
  Filled 2021-07-01 (×3): qty 1

## 2021-07-01 NOTE — H&P (Signed)
History and Physical    Reginald [name] GDJ:[id number] DOB: 04-20-1936 DOA: 07/01/2021  PCP: [name], MD  Patient coming from: [place] [facility]  I have personally briefly reviewed patient's old medical records in [place]  Chief Complaint: Chest pain  HPI: Reginald [name] is a 85 y.o. male with medical history significant for CAD s/p prior PCI to LAD in 1997, permanent atrial fibrillation on Eliquis, history of CVA with residual left-sided weakness, carotid artery and peripheral vascular disease, T2DM, HTN, HLD, CKD stage IIIa, and OSA on CPAP who presented to the ED for evaluation of chest pain.  Patient states that he was returning to his nursing facility from an eye doctor appointment.  He was transferring into bed with assistance from his nurse.  He says he got into an argument with his nurse while transferring and developed lower sternal chest pain.  Pain is described as an aching sensation without radiation.  It has been fluctuating in intensity since onset.  He did report briefly breaking out in sweats with 1 episode of nausea and vomiting.  He states this pain is different from when he had his heart attack.  He denies any dyspnea, abdominal pain, dysuria, diarrhea, constipation, peripheral edema.  ED Course:  Initial vitals showed BP 116/75, pulse 99, RR 27, temp 98.0 F, SPO2 91% on room air.  Labs show BUN 18, creatinine 1.78 (baseline 1.2-1.3), sodium 140, potassium 4.6, bicarb 28, serum glucose 156, WBC 12.4, hemoglobin 18.8, platelets 206,000, troponin 23 > 20.  SARS-CoV-2 and influenza PCR negative.  Portable chest x-ray negative for focal consolidation, edema, effusion.  EDP discussed with on-call cardiology who recommended medical admission for chest pain.  The hospitalist service was consulted to admit for further evaluation and management.  Review of Systems: All systems reviewed and are negative except as documented in history of present  illness above.   Past Medical History:  Diagnosis Date   Anteroseptal myocardial infarction [facility])    Carotid artery occlusion    CHF (congestive heart failure) (HCC)    Chronic kidney disease    COPD (chronic obstructive pulmonary disease) (HCC)    Coronary atherosclerosis of unspecified type of vessel, native or graft    a. s/p prior PCI to LAD in 1997   Diverticulosis    ED (erectile dysfunction)    Foley catheter in place    Glaucoma    Hematuria    History of 2019 novel coronavirus disease (COVID-19) 11/2018   Hydronephrosis, bilateral    Hydroureter    Hypercholesteremia    Hypertensive renal disease    Hypogonadism male    Hypokalemia    Obesity hypoventilation syndrome (HCC)    Obesity, unspecified    OSA (obstructive sleep apnea)    Peripheral vascular disease (HCC)    Permanent atrial fibrillation ([place])    Phimosis    Pneumonia    Rhabdomyolysis    Stroke ([place])    Tremor    Type II or unspecified type diabetes mellitus without mention of complication, not stated as uncontrolled    Unspecified essential hypertension    Urinary incontinence    Urinary retention    Vestibulopathy     Past Surgical History:  Procedure Laterality Date   APPENDECTOMY  1985   CARDIAC CATHETERIZATION     CHOLECYSTECTOMY  06/2000   CIRCUMCISION N/A 05/28/2020   Procedure: CIRCUMCISION ADULT;  Surgeon: Franchot Gallo, MD;  Location: [facility];  Service: Urology;  Laterality: N/A;  CORONARY ANGIOPLASTY  08/14/1995   stent placement to LAD    DORSAL SLIT N/A 05/28/2020   Procedure: DORSAL SLIT;  Surgeon: Franchot Gallo, MD;  Location: [facility];  Service: Urology;  Laterality: N/A;  45 MINS   ENDOVENOUS ABLATION SAPHENOUS VEIN W/ LASER Left 02/21/2018   endovenous laser ablation L GSV by Ruta Hinds MD    INGUINAL HERNIA REPAIR Right 1985   KNEE ARTHROSCOPY Left 1991   LUMBAR FUSION     NOSE SURGERY  1970s   Per Dr. Terrence Dupont [facility] in pt chart   TRANSCAROTID ARTERY  REVASCULARIZATION  Right 06/12/2020   Procedure: RIGHT TRANSCAROTID ARTERY REVASCULARIZATION;  Surgeon: Serafina Mitchell, MD;  Location: [facility] OR;  Service: Vascular;  Laterality: Right;    Social History:  reports that he has never smoked. He has never used smokeless tobacco. He reports that he does not drink alcohol and does not use drugs.  Allergies  Allergen Reactions   Adhesive [Tape] Rash    Family History  Problem Relation Age of Onset   COPD Mother    Heart disease Mother    Diabetes Father    Heart disease Father    Diabetes Sister    Heart disease Brother    Heart disease Brother    Breast cancer Maternal Aunt    Diabetes Paternal Grandmother    Colon cancer Maternal Aunt    Ovarian cancer Daughter    Glaucoma Other      Prior to Admission medications   Medication Sig Start Date End Date Taking? Authorizing Provider  acetaminophen (TYLENOL) 325 MG tablet Take 650 mg by mouth every 4 (four) hours as needed for moderate pain (for pain).    [provider]  albuterol (VENTOLIN HFA) 108 (90 Base) MCG/ACT inhaler Inhale 2 puffs into the lungs 2 (two) times daily as needed for wheezing or shortness of breath.    [provider]  apixaban (ELIQUIS) 5 MG TABS tablet Take 1 tablet (5 mg total) by mouth 2 (two) times daily. 08/07/20   Richarda Osmond, MD  aspirin EC 81 MG EC tablet Take 1 tablet (81 mg total) by mouth daily. Swallow whole. 06/13/20   Hosie Poisson, MD  atorvastatin (LIPITOR) 40 MG tablet Take 40 mg by mouth at bedtime.    [provider]  Cholecalciferol (VITAMIN D-3) 25 MCG (1000 UT) CAPS Take 2,000 Units by mouth daily.    [provider]  cloNIDine (CATAPRES) 0.1 MG tablet Take 0.1 mg by mouth 3 (three) times daily as needed (if systolic blood pressure over 160).    [provider]  Dextran 70-Hypromellose (ARTIFICIAL TEARS PF OP) Place 1 drop into both eyes 4 (four) times daily.    [provider]   dorzolamide-timolol (COSOPT) 22.3-6.8 MG/ML ophthalmic solution Place 1 drop into both eyes 2 (two) times daily.    [provider]  Dulaglutide (TRULICITY) 3 DD/2.2GU SOPN Inject 3 mg into the skin every Monday.    [provider]  fexofenadine (ALLEGRA) 60 MG tablet Take 60 mg by mouth daily.    [provider]  gabapentin (NEURONTIN) 100 MG capsule Take 2 capsules (200 mg total) by mouth at bedtime as needed. Patient taking differently: Take 200 mg by mouth at bedtime. 08/07/20   Richarda Osmond, MD  hydrALAZINE (APRESOLINE) 10 MG tablet Take 10 mg by mouth daily as needed (sbp >160).    [provider]  hydrALAZINE (APRESOLINE) 25 MG tablet Take 12.5 mg  by mouth 2 (two) times daily.    [provider]  latanoprost (XALATAN) 0.005 % ophthalmic solution Place 1 drop into both eyes at bedtime. 01/03/17   [provider]  losartan (COZAAR) 25 MG tablet Take 50 mg by mouth daily.    [provider]  magnesium oxide (MAG-OX) 400 MG tablet Take 400 mg by mouth daily.    [provider]  Melatonin 5 MG TABS Take 5 mg by mouth at bedtime.    [provider]  metoprolol succinate (TOPROL XL) 25 MG 24 hr tablet Take 0.5 tablets (12.5 mg total) by mouth daily. 06/14/20 06/14/21  Hosie Poisson, MD  nitroGLYCERIN (NITROSTAT) 0.4 MG SL tablet Place 0.4 mg under the tongue every 5 (five) minutes x 3 doses as needed for chest pain (and CALL PROVIDER IF NO RELIEF AFTER THE 3RD DOSE).    [provider]  nystatin (MYCOSTATIN/NYSTOP) powder Apply 1 application topically 3 (three) times daily. Underneath left breast    [provider]  polyethylene glycol (MIRALAX / GLYCOLAX) 17 g packet Take 17 g by mouth daily.    [provider]  repaglinide (PRANDIN) 0.5 MG tablet Take 0.5 mg by mouth daily. 08/03/20   [provider]  sennosides-docusate sodium (SENOKOT-S) 8.6-50 MG tablet Take 2 tablets by mouth  2 (two) times daily.    [provider]  Skin Protectants, Misc. (EUCERIN) cream Apply 1 application topically See admin instructions. Apply a thin layer to affected areas of the skin 2 times a day for itching    [provider]  tamsulosin (FLOMAX) 0.4 MG CAPS capsule Take 1 capsule (0.4 mg total) by mouth daily after supper. 12/25/18   Kayleen Memos, DO  VASCEPA 1 g capsule Take 2 g by mouth every evening.    [provider]  vitamin B-12 (CYANOCOBALAMIN) 1000 MCG tablet Take 500 mcg by mouth daily.    [provider]    Physical Exam: Vitals:   07/01/21 1810 07/01/21 1830 07/01/21 1900 07/01/21 1930  BP:  127/72 (!) 99/55 (!) 145/83  Pulse: 86 86 90 86  Resp: 20 19 (!) 24 20  Temp:    97.9 F (36.6 C)  TempSrc:    Oral  SpO2: 92% 90% 92% 92%  Weight:      Height:       Constitutional: Resting supine in bed, NAD, calm, comfortable Eyes: EOMI, lids and conjunctivae normal ENMT: Mucous membranes are moist. Posterior pharynx clear of any exudate or lesions.Normal dentition.  Neck: normal, supple, no masses. Respiratory: clear to auscultation bilaterally, no wheezing, no crackles. Normal respiratory effort. No accessory muscle use.  Cardiovascular: Irregularly irregular, no murmurs / rubs / gallops. No extremity edema. 2+ pedal pulses. Abdomen: no tenderness, no masses palpated. No hepatosplenomegaly. Bowel sounds positive.  Musculoskeletal: no clubbing / cyanosis. No joint deformity upper and lower extremities. Good ROM, no contractures. Normal muscle tone.  Skin: no rashes, lesions, ulcers. No induration Neurologic: CN 2-12 grossly intact. Sensation intact. Strength 4/5 LUE, 2/5 LLE, 5/5 RUE and RLE.  Psychiatric: Normal judgment and insight. Alert and oriented x 3. Normal mood.   Labs on Admission: I have personally reviewed following labs and imaging studies  CBC: Recent Labs  Lab 07/01/21 1500  WBC 12.4*  HGB 18.8*  HCT 61.1*  MCV 90.3   PLT 034   Basic Metabolic Panel: Recent Labs  Lab 07/01/21 1500  NA 140  K 4.6  CL 102  CO2  28  GLUCOSE 156*  BUN 18  CREATININE 1.78*  CALCIUM 10.1   GFR: Estimated Creatinine Clearance: 42.3 mL/min (A) (by C-G formula based on SCr of 1.78 mg/dL (H)). Liver Function Tests: No results for input(s): AST, ALT, ALKPHOS, BILITOT, PROT, ALBUMIN in the last 168 hours. No results for input(s): LIPASE, AMYLASE in the last 168 hours. No results for input(s): AMMONIA in the last 168 hours. Coagulation Profile: No results for input(s): INR, PROTIME in the last 168 hours. Cardiac Enzymes: No results for input(s): CKTOTAL, CKMB, CKMBINDEX, TROPONINI in the last 168 hours. BNP (last 3 results) No results for input(s): PROBNP in the last 8760 hours. HbA1C: No results for input(s): HGBA1C in the last 72 hours. CBG: No results for input(s): GLUCAP in the last 168 hours. Lipid Profile: No results for input(s): CHOL, HDL, LDLCALC, TRIG, CHOLHDL, LDLDIRECT in the last 72 hours. Thyroid Function Tests: No results for input(s): TSH, T4TOTAL, FREET4, T3FREE, THYROIDAB in the last 72 hours. Anemia Panel: No results for input(s): VITAMINB12, FOLATE, FERRITIN, TIBC, IRON, RETICCTPCT in the last 72 hours. Urine analysis:    Component Value Date/Time   COLORURINE YELLOW 08/10/2020 0055   APPEARANCEUR CLEAR 08/10/2020 0055   LABSPEC 1.014 08/10/2020 0055   PHURINE 6.0 08/10/2020 0055   GLUCOSEU NEGATIVE 08/10/2020 0055   HGBUR NEGATIVE 08/10/2020 0055   BILIRUBINUR NEGATIVE 08/10/2020 0055   KETONESUR NEGATIVE 08/10/2020 0055   PROTEINUR 100 (A) 08/10/2020 0055   UROBILINOGEN 1.0 05/13/2015 1549   NITRITE NEGATIVE 08/10/2020 0055   LEUKOCYTESUR NEGATIVE 08/10/2020 0055    Radiological Exams on Admission: DG Chest Portable 1 View  Result Date: 07/01/2021 CLINICAL DATA:  Chest pain EXAM: PORTABLE CHEST 1 VIEW COMPARISON:  04/09/2021 FINDINGS: Shallow inspiration with low lung volumes. No  new consolidation or edema. No pleural effusion. No pneumothorax. Stable cardiomediastinal contours. IMPRESSION: No acute process in the chest. Electronically Signed   By: Macy Mis M.D.   On: 07/01/2021 16:09    EKG: Personally reviewed. Atrial fibrillation, rate 101, PVC present.  Rate is faster and PVC new when compared to prior.  Assessment/Plan Principal Problem:   Chest pain Active Problems:   Type 2 diabetes mellitus (HCC)   OSA (obstructive sleep apnea)   Permanent atrial fibrillation (HCC)   CAD (coronary artery disease), native coronary artery   Hyperlipidemia associated with type 2 diabetes mellitus ([place])   Acute kidney injury superimposed on CKD ([place])   Hypertension associated with diabetes ([place])   History of CVA with residual deficit   Reginald [name] is a 85 y.o. male with medical history significant for CAD s/p prior PCI to LAD in 1997, permanent atrial fibrillation on Eliquis, history of CVA with residual left-sided weakness, carotid artery and peripheral vascular disease, T2DM, HTN, HLD, CKD stage IIIa, and OSA on CPAP who is admitted for chest pain evaluation.  Chest pain CAD s/p PCI to LAD 1997: Patient presenting with atypical nonradiating chest pain.  Troponin minimally elevated at 23 >> 20 on repeat.  EKG shows A. fib with PVC.  Given multiple comorbidities and remote stenting he is admitted for further chest pain evaluation. -Obtain echocardiogram -Keep on telemetry -Continue aspirin 81 mg daily, atorvastatin, Toprol-XL  Acute kidney injury on CKD stage IIIa: Creatinine 1.78 on admission, baseline appears to be around 1.2-1.3.  Appears mildly volume depleted on admission.  Likely some medication effect as well. -Start on gentle IV fluid with NS@75  mL/hour overnight -Obtain urine studies, monitor urine output -Hold losartan  Permanent atrial fibrillation: Remains in atrial fibrillation, rate borderline tachycardic.  Continue Toprol-XL and  Eliquis.  COPD: Stable without acute respiratory symptoms.  Continue albuterol as needed.  History of CVA with residual left sided weakness: Chronic left-sided weakness, resides at SNF, requires wheelchair for transport.  Continue aspirin, Eliquis, atorvastatin.  Type 2 diabetes: Hold home medications and place on SSI.  Hypertension: Continue Toprol-XL.  Holding losartan with AKI.  Holding hydralazine and clonidine due to soft blood pressures on admission.  Hyperlipidemia: Continue atorvastatin.  OSA: Continue CPAP nightly.  DVT prophylaxis: Eliquis Code Status: Full code, confirmed on admission Family Communication: Discussed with patient, he has discussed with his significant other Disposition Plan: From SNF and likely return to SNF pending clinical progress Consults called: EDP discussed with on-call cardiology Level of care: Progressive Admission status:  Status is: Observation  The patient remains OBS appropriate and will d/c before 2 midnights.  Zada Finders MD [facility]  If 7PM-7AM, please contact night-coverage [email or web address]  07/01/2021, 7:42 PM

## 2021-07-01 NOTE — ED Provider Notes (Signed)
[facility]ERGENCY DEPARTMENT Provider Note   CSN: [id number] Arrival date & time: 07/01/21  1440     History Chief Complaint  Patient presents with   Chest Pain    Reginald [name] is a 85 y.o. male past medical history significant for CAD s/p PCI, COPD, HTN, HLD, prior stroke with residual left-sided weakness and right eye blindness, DM 2 who presents with chest pain.  Patient was at his facility when he got to an argument with the nurse and started to have chest pain.  He describes it as located on the left side of his chest.  It is worse with deep inspiration.  The pain does not radiate.  He had some numbness and tingling in his right hand at the time.  He denies diaphoresis.  The patient is also had a cough that began today.  He had 1 episode of posttussive emesis, but is otherwise not been nauseous.  He received aspirin 325 and SL nitro x2 with EMS.  Patient denies any improvement in his chest pain after the nitro.  He states that this feels like his prior heart attack, but "worse."     Past Medical History:  Diagnosis Date   Anteroseptal myocardial infarction [facility])    Carotid artery occlusion    CHF (congestive heart failure) ([place])    Chronic kidney disease    COPD (chronic obstructive pulmonary disease) ([place])    Coronary atherosclerosis of unspecified type of vessel, native or graft    a. s/p prior PCI to LAD in 1997   Diverticulosis    ED (erectile dysfunction)    Foley catheter in place    Glaucoma    Hematuria    History of 2019 novel coronavirus disease (COVID-19) 11/2018   Hydronephrosis, bilateral    Hydroureter    Hypercholesteremia    Hypertensive renal disease    Hypogonadism male    Hypokalemia    Obesity hypoventilation syndrome (HCC)    Obesity, unspecified    OSA (obstructive sleep apnea)    Peripheral vascular disease ([place])    Permanent atrial fibrillation ([place])    Phimosis    Pneumonia    Rhabdomyolysis    Stroke ([place])     Tremor    Type II or unspecified type diabetes mellitus without mention of complication, not stated as uncontrolled    Unspecified essential hypertension    Urinary incontinence    Urinary retention    Vestibulopathy     Patient Active Problem List   Diagnosis Date Noted   Chest pain 07/01/2021   History of CVA with residual deficit 07/01/2021   Pneumothorax 04/08/2021   Stroke ([place]) 08/05/2020   Vision loss of right eye 06/09/2020   Acute on chronic renal failure ([place]) 12/19/2018   Acute renal failure superimposed on stage 4 chronic kidney disease ([place]) 12/19/2018   AKI (acute kidney injury) ([place]) 11/26/2018   HCAP (healthcare-associated pneumonia) 11/15/2018   COVID-19 11/15/2018   Chronic venous insufficiency 10/04/2017   Venous stasis ulcers of both lower extremities ([place]) 10/04/2017   Nonspecific chest pain 10/27/2016   Morbid obesity ([place]) 05/02/2016   Lymphedema 05/02/2016   Abnormal laboratory test result 10/15/2015   Cough 10/08/2015   Hematuria 09/21/2015   Hypokalemia 08/13/2015   Hypertension associated with diabetes ([place]) 08/13/2015   Multiple open wounds of lower leg 07/22/2015   CKD (chronic kidney disease) stage 3, GFR 30-59 ml/min (HCC) 07/22/2015   Cellulitis of leg, right 06/28/2015   Acute  kidney injury superimposed on CKD ([place]) 06/28/2015   Hypertensive heart disease with CHF (congestive heart failure) ([place]) 05/19/2015   CAD (coronary artery disease), native coronary artery 05/19/2015   Hyperlipidemia associated with type 2 diabetes mellitus ([place]) 05/19/2015   Vitamin D deficiency 05/19/2015   Pressure ulcer 80/32/1224   Diastolic dysfunction with chronic heart failure ([place]) 05/08/2015   Blisters of multiple sites 05/08/2015   Type 2 diabetes mellitus ([place])    Obesity, unspecified    OSA (obstructive sleep apnea)    Permanent atrial fibrillation ([place])    Secondary DM with CKD stage 4 and hypertension ([place])    Glaucoma    Cellulitis 05/07/2015     Past Surgical History:  Procedure Laterality Date   APPENDECTOMY  1985   CARDIAC CATHETERIZATION     CHOLECYSTECTOMY  06/2000   CIRCUMCISION N/A 05/28/2020   Procedure: CIRCUMCISION ADULT;  Surgeon: Franchot Gallo, MD;  Location: [facility] ORS;  Service: Urology;  Laterality: N/A;   CORONARY ANGIOPLASTY  08/14/1995   stent placement to LAD    DORSAL SLIT N/A 05/28/2020   Procedure: DORSAL SLIT;  Surgeon: Franchot Gallo, MD;  Location: [facility] ORS;  Service: Urology;  Laterality: N/A;  82 MINS   ENDOVENOUS ABLATION SAPHENOUS VEIN W/ LASER Left 02/21/2018   endovenous laser ablation L GSV by Ruta Hinds MD    INGUINAL HERNIA REPAIR Right 1985   KNEE ARTHROSCOPY Left 1991   LUMBAR FUSION     NOSE SURGERY  1970s   Per Dr. Terrence Dupont [facility] in pt chart   TRANSCAROTID ARTERY REVASCULARIZATION  Right 06/12/2020   Procedure: RIGHT TRANSCAROTID ARTERY REVASCULARIZATION;  Surgeon: Serafina Mitchell, MD;  Location: [facility] OR;  Service: Vascular;  Laterality: Right;       Family History  Problem Relation Age of Onset   COPD Mother    Heart disease Mother    Diabetes Father    Heart disease Father    Diabetes Sister    Heart disease Brother    Heart disease Brother    Breast cancer Maternal Aunt    Diabetes Paternal Grandmother    Colon cancer Maternal Aunt    Ovarian cancer Daughter    Glaucoma Other     Social History   Tobacco Use   Smoking status: Never   Smokeless tobacco: Never  Vaping Use   Vaping Use: Never used  Substance Use Topics   Alcohol use: No    Alcohol/week: 0.0 standard drinks   Drug use: No    Home Medications Prior to Admission medications   Medication Sig Start Date End Date Taking? Authorizing Provider  acetaminophen (TYLENOL) 325 MG tablet Take 650 mg by mouth every 4 (four) hours as needed for moderate pain (for pain).   Yes [provider]  apixaban (ELIQUIS) 5 MG TABS tablet Take 1 tablet (5 mg total) by mouth 2 (two) times daily. 08/07/20  Yes  Richarda Osmond, MD  aspirin EC 81 MG EC tablet Take 1 tablet (81 mg total) by mouth daily. Swallow whole. 06/13/20  Yes Hosie Poisson, MD  atorvastatin (LIPITOR) 40 MG tablet Take 40 mg by mouth at bedtime.   Yes [provider]  Cholecalciferol (VITAMIN D-3) 25 MCG (1000 UT) CAPS Take 2,000 Units by mouth daily.   Yes [provider]  Dextran 70-Hypromellose (ARTIFICIAL TEARS PF OP) Place 1 drop into both eyes 4 (four) times daily.   Yes [provider]  dorzolamide-timolol (COSOPT) 22.3-6.8 MG/ML ophthalmic solution Place 1  drop into both eyes 2 (two) times daily.   Yes [provider]  fexofenadine (ALLEGRA) 60 MG tablet Take 60 mg by mouth daily.   Yes [provider]  gabapentin (NEURONTIN) 100 MG capsule Take 2 capsules (200 mg total) by mouth at bedtime as needed. Patient taking differently: Take 200 mg by mouth at bedtime. 08/07/20  Yes Richarda Osmond, MD  hydrALAZINE (APRESOLINE) 10 MG tablet Take 10 mg by mouth daily as needed (sbp >160).   Yes [provider]  hydrALAZINE (APRESOLINE) 25 MG tablet Take 12.5 mg by mouth 2 (two) times daily.   Yes [provider]  latanoprost (XALATAN) 0.005 % ophthalmic solution Place 1 drop into both eyes at bedtime. 01/03/17  Yes [provider]  losartan (COZAAR) 25 MG tablet Take 50 mg by mouth daily.   Yes [provider]  magnesium oxide (MAG-OX) 400 MG tablet Take 400 mg by mouth daily.   Yes [provider]  Melatonin 5 MG TABS Take 5 mg by mouth at bedtime.   Yes [provider]  metoprolol succinate (TOPROL XL) 25 MG 24 hr tablet Take 0.5 tablets (12.5 mg total) by mouth daily. 06/14/20 07/01/21 Yes Hosie Poisson, MD  nitroGLYCERIN (NITROSTAT) 0.4 MG SL tablet Place 0.4 mg under the tongue every 5 (five) minutes x 3 doses as needed for chest pain (and CALL PROVIDER IF NO RELIEF AFTER THE 3RD DOSE).   Yes [provider]  polyethylene  glycol (MIRALAX / GLYCOLAX) 17 g packet Take 17 g by mouth daily.   Yes [provider]  repaglinide (PRANDIN) 0.5 MG tablet Take 0.5 mg by mouth daily. 08/03/20  Yes [provider]  sennosides-docusate sodium (SENOKOT-S) 8.6-50 MG tablet Take 2 tablets by mouth 2 (two) times daily.   Yes [provider]  Skin Protectants, Misc. (EUCERIN) cream Apply 1 application topically See admin instructions. Apply a thin layer to affected areas of the skin 2 times a day for itching   Yes [provider]  tamsulosin (FLOMAX) 0.4 MG CAPS capsule Take 1 capsule (0.4 mg total) by mouth daily after supper. 12/25/18  Yes Hall, Carole N, DO  VASCEPA 1 g capsule Take 2 g by mouth every evening.   Yes [provider]  vitamin B-12 (CYANOCOBALAMIN) 1000 MCG tablet Take 500 mcg by mouth daily.   Yes [provider]  albuterol (VENTOLIN HFA) 108 (90 Base) MCG/ACT inhaler Inhale 2 puffs into the lungs 2 (two) times daily as needed for wheezing or shortness of breath.    [provider]  cloNIDine (CATAPRES) 0.1 MG tablet Take 0.1 mg by mouth 3 (three) times daily as needed (if systolic blood pressure over 160).    [provider]  Dulaglutide (TRULICITY) 3 RF/1.6BW SOPN Inject 3 mg into the skin every Monday.    [provider]  nystatin (MYCOSTATIN/NYSTOP) powder Apply 1 application topically 3 (three) times daily. Underneath left breast    [provider]    Allergies    Adhesive [tape]  Review of Systems   Review of Systems  Constitutional:  Negative for chills and fever.  HENT:  Negative for ear pain and sore throat.   Eyes:  Negative for pain and visual disturbance.  Respiratory:  Positive for cough. Negative for shortness of breath.   Cardiovascular:  Positive for chest pain. Negative for palpitations.  Gastrointestinal:  Positive for nausea and vomiting. Negative for abdominal pain.  Genitourinary:  Negative for dysuria  and hematuria.  Musculoskeletal:  Negative for arthralgias and back pain.  Skin:  Negative for color change and rash.  Neurological:  Negative for seizures and syncope.  All other systems reviewed and are negative.  Physical Exam Updated Vital Signs BP (!) 98/50    Pulse 86    Temp 98 F (36.7 C) (Oral)    Resp 20    Ht 5\' 11"  (1.803 m)    Wt 124.7 kg    SpO2 92%    BMI 38.34 kg/m   Physical Exam Vitals and nursing note reviewed.  Constitutional:      General: He is not in acute distress.    Appearance: He is well-developed. He is obese.  HENT:     Head: Normocephalic and atraumatic.     Right Ear: External ear normal.     Left Ear: External ear normal.     Nose: Nose normal.     Mouth/Throat:     Pharynx: Oropharynx is clear.  Eyes:     Extraocular Movements: Extraocular movements intact.     Conjunctiva/sclera: Conjunctivae normal.     Pupils: Pupils are equal, round, and reactive to light.  Cardiovascular:     Rate and Rhythm: Normal rate and regular rhythm.     Heart sounds: Normal heart sounds. No murmur heard. Pulmonary:     Effort: Pulmonary effort is normal. No respiratory distress.     Breath sounds: Normal breath sounds.  Abdominal:     Palpations: Abdomen is soft.     Tenderness: There is no abdominal tenderness.  Musculoskeletal:        General: No swelling.     Cervical back: Neck supple.  Skin:    General: Skin is warm and dry.     Capillary Refill: Capillary refill takes less than 2 seconds.  Neurological:     Mental Status: He is alert.  Psychiatric:        Mood and Affect: Mood normal.    ED Results / Procedures / Treatments   Labs (all labs ordered are listed, but only abnormal results are displayed) Labs Reviewed  BASIC METABOLIC PANEL - Abnormal; Notable for the following components:      Result Value   Glucose, Bld 156 (*)    Creatinine, Ser 1.78 (*)    GFR, Estimated 37 (*)    All other components within normal limits  CBC - Abnormal;  Notable for the following components:   WBC 12.4 (*)    RBC 6.77 (*)    Hemoglobin 18.8 (*)    HCT 61.1 (*)    RDW 16.6 (*)    All other components within normal limits  TROPONIN I (HIGH SENSITIVITY) - Abnormal; Notable for the following components:   Troponin I (High Sensitivity) 23 (*)    All other components within normal limits  TROPONIN I (HIGH SENSITIVITY) - Abnormal; Notable for the following components:   Troponin I (High Sensitivity) 20 (*)    All other components within normal limits  RESP PANEL BY RT-PCR (FLU A&B, COVID) [id number]    EKG EKG Interpretation  Date/Time:  Thursday July 01 2021 14:49:01 EST Ventricular Rate:  101 PR Interval:    QRS Duration: 104 QT Interval:  317 QTC Calculation: 411 R Axis:   56 Text Interpretation: Atrial fibrillation Ventricular premature complex Probable anteroseptal infarct, old Borderline repolarization abnormality No significant change since last tracing Confirmed by Wandra Arthurs [phone]) on 07/01/2021 3:15:44 PM  Radiology DG Chest Portable 1  View  Result Date: 07/01/2021 CLINICAL DATA:  Chest pain EXAM: PORTABLE CHEST 1 VIEW COMPARISON:  04/09/2021 FINDINGS: Shallow inspiration with low lung volumes. No new consolidation or edema. No pleural effusion. No pneumothorax. Stable cardiomediastinal contours. IMPRESSION: No acute process in the chest. Electronically Signed   By: Macy Mis M.D.   On: 07/01/2021 16:09    Procedures Procedures   Medications Ordered in ED Medications - No data to display  ED Course  I have reviewed the triage vital signs and the nursing notes.  Pertinent labs & imaging results that were available during my care of the patient were reviewed by me and considered in my medical decision making (see chart for details).    MDM Rules/Calculators/A&P                          Patient presents with chest pain as documented in HPI above.  Physical exam largely unremarkable.  No pneumothorax,  pneumonia, or widened mediastinum on CXR.  Upper extremity pulses are equal bilaterally and patient does not describe pain as sharp or tearing to his back.  Doubt aortic dissection and will not pursue further imaging.  Patient is anticoagulated, is not hypoxic, and does not describe significant shortness of breath, doubt pulmonary embolism.  EKG shows rate controlled atrial fibrillation with PVCs. No acute signs of ischemia or significant changes compared to prior.  Given patient's chest pain, prior stenting, and slightly elevated troponins, patient requires admission for high risk chest pain rule out.  I discussed the patient with cardiology who do not believe the patient requires cardiology consultation right now, but recommended hospitalist admission for chest pain rule out.  I discussed the patient with hospitalist medicine who will admit the patient for high risk chest pain rule out.   Final Clinical Impression(s) / ED Diagnoses Final diagnoses:  None    Rx / DC Orders ED Discharge Orders     None        Armonii Sieh, Amalia Hailey, MD 07/02/21 1032    Drenda Freeze, MD 07/02/21 1451

## 2021-07-01 NOTE — ED Notes (Signed)
I turned pt on his left side

## 2021-07-01 NOTE — ED Triage Notes (Signed)
Pt arrived via [facility] from [facility] 8/10 left sided chest pain that started 2 hrs ago. Per EMS pain is worse with inspiration. Pt developed a cough today. Rapid COVID at facility was neg. Facility gave nitrox2. Pt is A&Ox4. Pt is A-fib w/BBB on monitor. VSS

## 2021-07-02 ENCOUNTER — Observation Stay (HOSPITAL_BASED_OUTPATIENT_CLINIC_OR_DEPARTMENT_OTHER): Payer: Medicare Other

## 2021-07-02 DIAGNOSIS — I152 Hypertension secondary to endocrine disorders: Secondary | ICD-10-CM

## 2021-07-02 DIAGNOSIS — E1169 Type 2 diabetes mellitus with other specified complication: Secondary | ICD-10-CM | POA: Diagnosis not present

## 2021-07-02 DIAGNOSIS — N189 Chronic kidney disease, unspecified: Secondary | ICD-10-CM

## 2021-07-02 DIAGNOSIS — E785 Hyperlipidemia, unspecified: Secondary | ICD-10-CM | POA: Diagnosis not present

## 2021-07-02 DIAGNOSIS — E1159 Type 2 diabetes mellitus with other circulatory complications: Secondary | ICD-10-CM

## 2021-07-02 DIAGNOSIS — R079 Chest pain, unspecified: Secondary | ICD-10-CM

## 2021-07-02 DIAGNOSIS — I4821 Permanent atrial fibrillation: Secondary | ICD-10-CM | POA: Diagnosis not present

## 2021-07-02 DIAGNOSIS — I6930 Unspecified sequelae of cerebral infarction: Secondary | ICD-10-CM

## 2021-07-02 DIAGNOSIS — I25119 Atherosclerotic heart disease of native coronary artery with unspecified angina pectoris: Secondary | ICD-10-CM | POA: Diagnosis not present

## 2021-07-02 DIAGNOSIS — N179 Acute kidney failure, unspecified: Secondary | ICD-10-CM

## 2021-07-02 LAB — ECHOCARDIOGRAM COMPLETE
AR max vel: 3.34 cm2
AV Area VTI: 3.56 cm2
AV Area mean vel: 3.09 cm2
AV Mean grad: 1 mmHg
AV Peak grad: 2.2 mmHg
Ao pk vel: 0.75 m/s
Height: 71 in
S' Lateral: 3 cm
Weight: 4256 oz

## 2021-07-02 LAB — URINALYSIS, ROUTINE W REFLEX MICROSCOPIC
Bilirubin Urine: NEGATIVE
Glucose, UA: NEGATIVE mg/dL
Ketones, ur: NEGATIVE mg/dL
Nitrite: NEGATIVE
Protein, ur: 100 mg/dL — AB
Specific Gravity, Urine: 1.018 (ref 1.005–1.030)
pH: 5 (ref 5.0–8.0)

## 2021-07-02 LAB — SODIUM, URINE, RANDOM: Sodium, Ur: 42 mmol/L

## 2021-07-02 LAB — GLUCOSE, CAPILLARY
Glucose-Capillary: 127 mg/dL — ABNORMAL HIGH (ref 70–99)
Glucose-Capillary: 112 mg/dL — ABNORMAL HIGH (ref 70–99)
Glucose-Capillary: 116 mg/dL — ABNORMAL HIGH (ref 70–99)
Glucose-Capillary: 131 mg/dL — ABNORMAL HIGH (ref 70–99)

## 2021-07-02 LAB — BASIC METABOLIC PANEL
Anion gap: 5 (ref 5–15)
BUN: 19 mg/dL (ref 8–23)
CO2: 29 mmol/L (ref 22–32)
Calcium: 9.3 mg/dL (ref 8.9–10.3)
Chloride: 106 mmol/L (ref 98–111)
Creatinine, Ser: 1.44 mg/dL — ABNORMAL HIGH (ref 0.61–1.24)
GFR, Estimated: 48 mL/min — ABNORMAL LOW (ref >60)
Glucose, Bld: 127 mg/dL — ABNORMAL HIGH (ref 70–99)
Potassium: 3.6 mmol/L (ref 3.5–5.1)
Sodium: 140 mmol/L (ref 135–145)

## 2021-07-02 LAB — CBC
HCT: 53 % — ABNORMAL HIGH (ref 39.0–52.0)
Hemoglobin: 16.9 g/dL (ref 13.0–17.0)
MCH: 28.2 pg (ref 26.0–34.0)
MCHC: 31.9 g/dL (ref 30.0–36.0)
MCV: 88.3 fL (ref 80.0–100.0)
Platelets: 194 10*3/uL (ref 150–400)
RBC: 6 MIL/uL — ABNORMAL HIGH (ref 4.22–5.81)
RDW: 15.1 % (ref 11.5–15.5)
WBC: 11.4 10*3/uL — ABNORMAL HIGH (ref 4.0–10.5)
nRBC: 0 % (ref 0.0–0.2)

## 2021-07-02 LAB — CREATININE, URINE, RANDOM: Creatinine, Urine: 198.66 mg/dL

## 2021-07-02 LAB — MRSA NEXT GEN BY PCR, NASAL: MRSA by PCR Next Gen: NOT DETECTED

## 2021-07-02 MED ORDER — APIXABAN 5 MG PO TABS
5.0000 mg | ORAL_TABLET | Freq: Two times a day (BID) | ORAL | Status: DC
  Administered 2021-07-02 – 2021-07-03 (×3): 5 mg via ORAL
  Filled 2021-07-02 (×3): qty 1

## 2021-07-02 NOTE — Progress Notes (Signed)
PROGRESS NOTE    Reginald [name]  GMW:[id number] DOB: Sep 14, 1935 DOA: 07/01/2021 PCP: [name], MD    Brief Narrative:  Reginald [name] is a 85 y.o. male with medical history significant for CAD s/p prior PCI to LAD in 1997, permanent atrial fibrillation on Eliquis, history of CVA with residual left-sided weakness, carotid artery and peripheral vascular disease, T2DM, HTN, HLD, CKD stage IIIa, and OSA on CPAP who presented to the ED for evaluation of chest pain.   Assessment & Plan:   Principal Problem:   Chest pain Active Problems:   Type 2 diabetes mellitus (HCC)   OSA (obstructive sleep apnea)   Permanent atrial fibrillation (HCC)   CAD (coronary artery disease), native coronary artery   Hyperlipidemia associated with type 2 diabetes mellitus (HCC)   Acute kidney injury superimposed on CKD ([place])   Hypertension associated with diabetes ([place])   History of CVA with residual deficit  Chest pain. Coronary artery disease status post PCI. Patient has multiple risk factors for CAD. Troponin peaked at 23, not significant elevated. Currently pending cardiology consult.  I have notified cardiology again today.  Acute kidney injury on CKD stage IIIa: Renal function improved after giving gentle rehydration.  Permanent atrial fibrillation. Continue beta-blocker and Eliquis.  COPD Stable, no bronchospasm plan  History of CVA with residual left-sided weakness We will send back to nursing home tomorrow.  Type 2 diabetes. Continue sliding scale insulin.  Essential hypertension.  Obstruct sleep apnea. Continue CPAP nightly.   DVT prophylaxis: Eliquis Code Status: full Family Communication:  Disposition Plan:    Status is: Observation         I/O last 3 completed shifts: In: 865.5 [P.O.:240; I.V.:625.5] Out: 150 [Urine:150] No intake/output data recorded.     Consultants:  cardiology  Procedures: None  Antimicrobials:  None  Subjective: Patient does not have any additional chest pain or shortness of breath. No abdominal pain nausea vomiting No dysuria hematuria  No fever or chills.  Objective: Vitals:   07/02/21 0615 07/02/21 0754 07/02/21 0851 07/02/21 1143  BP: (!) 109/57 (!) 101/53 (!) 131/58 (!) 116/55  Pulse: (!) 53 63 65 62  Resp: 15 18  17   Temp: 98.4 F (36.9 C) 98.3 F (36.8 C)  98.4 F (36.9 C)  TempSrc: Oral Oral  Oral  SpO2: 95% 93%    Weight: 120.7 kg     Height:        Intake/Output Summary (Last 24 hours) at 07/02/2021 1355 Last data filed at 07/02/2021 0600 Gross per 24 hour  Intake 865.48 ml  Output 150 ml  Net 715.48 ml   Filed Weights   07/01/21 1455 07/01/21 2026 07/02/21 0615  Weight: 124.7 kg 121.6 kg 120.7 kg    Examination:  General exam: Appears calm and comfortable  Respiratory system: Clear to auscultation. Respiratory effort normal. Cardiovascular system: S1 & S2 heard, RRR. No JVD, murmurs, rubs, gallops or clicks. No pedal edema. Gastrointestinal system: Abdomen is nondistended, soft and nontender. No organomegaly or masses felt. Normal bowel sounds heard. Central nervous system: Alert and oriented x2. No focal neurological deficits. Extremities: Symmetric 5 x 5 power. Skin: No rashes, lesions or ulcers Psychiatry: Judgement and insight appear normal. Mood & affect appropriate.     Data Reviewed: I have personally reviewed following labs and imaging studies  CBC: Recent Labs  Lab 07/01/21 1500 07/02/21 0150  WBC 12.4* 11.4*  HGB 18.8* 16.9  HCT 61.1* 53.0*  MCV 90.3 88.3  PLT 206 322   Basic Metabolic Panel: Recent Labs  Lab 07/01/21 1500 07/02/21 0150  NA 140 140  K 4.6 3.6  CL 102 106  CO2 28 29  GLUCOSE 156* 127*  BUN 18 19  CREATININE 1.78* 1.44*  CALCIUM 10.1 9.3   GFR: Estimated Creatinine Clearance: 51.4 mL/min (A) (by C-G formula based on SCr of 1.44 mg/dL (H)). Liver Function Tests: No results for input(s): AST,  ALT, ALKPHOS, BILITOT, PROT, ALBUMIN in the last 168 hours. No results for input(s): LIPASE, AMYLASE in the last 168 hours. No results for input(s): AMMONIA in the last 168 hours. Coagulation Profile: No results for input(s): INR, PROTIME in the last 168 hours. Cardiac Enzymes: No results for input(s): CKTOTAL, CKMB, CKMBINDEX, TROPONINI in the last 168 hours. BNP (last 3 results) No results for input(s): PROBNP in the last 8760 hours. HbA1C: No results for input(s): HGBA1C in the last 72 hours. CBG: Recent Labs  Lab 07/01/21 2055 07/02/21 0823 07/02/21 1142  GLUCAP 136* 127* 112*   Lipid Profile: No results for input(s): CHOL, HDL, LDLCALC, TRIG, CHOLHDL, LDLDIRECT in the last 72 hours. Thyroid Function Tests: No results for input(s): TSH, T4TOTAL, FREET4, T3FREE, THYROIDAB in the last 72 hours. Anemia Panel: No results for input(s): VITAMINB12, FOLATE, FERRITIN, TIBC, IRON, RETICCTPCT in the last 72 hours. Sepsis Labs: No results for input(s): PROCALCITON, LATICACIDVEN in the last 168 hours.  Recent Results (from the past 240 hour(s))  Resp Panel by RT-PCR (Flu A&B, Covid) Nasopharyngeal Swab     Status: None   Collection Time: 07/01/21  3:35 PM   Specimen: Nasopharyngeal Swab; Nasopharyngeal(NP) swabs in vial transport medium  Result Value Ref Range Status   SARS Coronavirus 2 by RT PCR NEGATIVE NEGATIVE Final    Comment: (NOTE) SARS-CoV-2 target nucleic acids are NOT DETECTED.  The SARS-CoV-2 RNA is generally detectable in upper respiratory specimens during the acute phase of infection. The lowest concentration of SARS-CoV-2 viral copies this assay can detect is 138 copies/mL. A negative result does not preclude SARS-Cov-2 infection and should not be used as the sole basis for treatment or other patient management decisions. A negative result may occur with  improper specimen collection/handling, submission of specimen other than nasopharyngeal swab, presence of viral  mutation(s) within the areas targeted by this assay, and inadequate number of viral copies(<138 copies/mL). A negative result must be combined with clinical observations, patient history, and epidemiological information. The expected result is Negative.  Fact Sheet for Patients:  EntrepreneurPulse.com.au  Fact Sheet for Healthcare Providers:  IncredibleEmployment.be  This test is no t yet approved or cleared by the [place] FDA and  has been authorized for detection and/or diagnosis of SARS-CoV-2 by FDA under an Emergency Use Authorization (EUA). This EUA will remain  in effect (meaning this test can be used) for the duration of the COVID-19 declaration under Section 564(b)(1) of the Act, 21 U.S.C.section 360bbb-3(b)(1), unless the authorization is terminated  or revoked sooner.       Influenza A by PCR NEGATIVE NEGATIVE Final   Influenza B by PCR NEGATIVE NEGATIVE Final    Comment: (NOTE) The Xpert Xpress SARS-CoV-2/FLU/RSV plus assay is intended as an aid in the diagnosis of influenza from Nasopharyngeal swab specimens and should not be used as a sole basis for treatment. Nasal washings and aspirates are unacceptable for Xpert Xpress SARS-CoV-2/FLU/RSV testing.  Fact Sheet for Patients: EntrepreneurPulse.com.au  Fact Sheet for Healthcare Providers: IncredibleEmployment.be  This test is not yet approved  or cleared by the [place] and has been authorized for detection and/or diagnosis of SARS-CoV-2 by FDA under an Emergency Use Authorization (EUA). This EUA will remain in effect (meaning this test can be used) for the duration of the COVID-19 declaration under Section 564(b)(1) of the Act, 21 U.S.C. section 360bbb-3(b)(1), unless the authorization is terminated or revoked.  Performed at [facility], [place] [address], [place], [place] [address]   MRSA Next Gen by PCR, Nasal      Status: None   Collection Time: 07/02/21  6:24 AM   Specimen: Urine, Clean Catch; Nasal Swab  Result Value Ref Range Status   MRSA by PCR Next Gen NOT DETECTED NOT DETECTED Final    Comment: (NOTE) The GeneXpert MRSA Assay (FDA approved for NASAL specimens only), is one component of a comprehensive MRSA colonization surveillance program. It is not intended to diagnose MRSA infection nor to guide or monitor treatment for MRSA infections. Test performance is not FDA approved in patients less than 54 years old. Performed at [facility], [place] [address] [place], [place] [address]          Radiology Studies: DG Chest Portable 1 View  Result Date: 07/01/2021 CLINICAL DATA:  Chest pain EXAM: PORTABLE CHEST 1 VIEW COMPARISON:  04/09/2021 FINDINGS: Shallow inspiration with low lung volumes. No new consolidation or edema. No pleural effusion. No pneumothorax. Stable cardiomediastinal contours. IMPRESSION: No acute process in the chest. Electronically Signed   By: Macy Mis M.D.   On: 07/01/2021 16:09        Scheduled Meds:  apixaban  5 mg Oral BID   aspirin EC  81 mg Oral Daily   atorvastatin  40 mg Oral QHS   dorzolamide-timolol  1 drop Both Eyes BID   gabapentin  200 mg Oral QHS   icosapent Ethyl  2 g Oral QPM   insulin aspart  0-9 Units Subcutaneous TID WC   latanoprost  1 drop Both Eyes QHS   melatonin  5 mg Oral QHS   metoprolol succinate  12.5 mg Oral Daily   tamsulosin  0.4 mg Oral QPC supper   Continuous Infusions:   LOS: 0 days    Time spent: 26 minutes    Sharen Hones, MD [facility]   To contact the attending provider between 7A-7P or the covering provider during after hours 7P-7A, please log into the web site [email or web address] and access using universal [place] password for that web site. If you do not have the password, please call the hospital operator.  07/02/2021, 1:55 PM

## 2021-07-02 NOTE — Progress Notes (Signed)
PT states he does not use cpap.

## 2021-07-02 NOTE — Progress Notes (Signed)
°  Echocardiogram 2D Echocardiogram has been performed.  Reginald [name] 07/02/2021, 11:19 AM

## 2021-07-02 NOTE — Evaluation (Signed)
Physical Therapy Evaluation Patient Details Name: Reginald [name] MRN: [id number] DOB: 01/26/1936 Today's Date: 07/02/2021  History of Present Illness  The pt is an 85 yo male presenting 12/22 from SNF with acute onset chest pain. PMH includes: CAD s/p PCI to LAD, afib on Eliquis, CVA with L-sided residual weakness, CAD and PVD, DM II, HTN, HLD, CKD III, OSA, and obesity.   Clinical Impression  Pt in bed upon arrival of PT, agreeable to evaluation at this time. Prior to admission the pt was mobilizing with assist of +2 staff at SNF to complete pivot transfers using a stedy, and is dependent on staff for all bathing, dressing, and toileting. The pt now presents with limitations in functional mobility, strength, seated balance, and power to achieve stand due to above dx, and will continue to benefit from skilled PT to address these deficits. The pt initially stated he would be able to step to chair, then requested use of stedy, and eventually revealed he typically requires +2 to complete any OOB mobility with staff at [facility]. The pt was unable to achieve hip clearance for sit-stand despite maxA and elevated surface at this time, will continue to benefit from skilled PT to progress functional LE strength and capacity for transfers, but will be safe to return to SNF when medically cleared.         Recommendations for follow up therapy are one component of a multi-disciplinary discharge planning process, led by the attending physician.  Recommendations may be updated based on patient status, additional functional criteria and insurance authorization.  Follow Up Recommendations Skilled nursing-short term rehab (<3 hours/day)    Assistance Recommended at Discharge Frequent or constant Supervision/Assistance  Functional Status Assessment Patient has had a recent decline in their functional status and demonstrates the ability to make significant improvements in function in a reasonable and predictable amount  of time.  Equipment Recommendations  None recommended by PT    Recommendations for Other Services       Precautions / Restrictions Precautions Precautions: Fall Precaution Comments: residual L sided weakness from CVA Restrictions Weight Bearing Restrictions: No      Mobility  Bed Mobility Overal bed mobility: Needs Assistance Bed Mobility: Rolling;Sidelying to Sit;Sit to Sidelying Rolling: Mod assist Sidelying to sit: Max assist     Sit to sidelying: Max assist;+2 for physical assistance General bed mobility comments: modA to complete roll with cues and hand-over-hand placement for UE on bed rail. pt needing maxA to elevate trunk from bed. maxof 2 to return to bed and boost    Transfers Overall transfer level: Needs assistance Equipment used: Ambulation equipment used Transfers: Sit to/from Stand Sit to Stand: Max assist;+2 physical assistance;From elevated surface           General transfer comment: attempted x3 with use of BUE support, stedy, and with progressively elevated surface. pt unable to achieve hip clearance from bed despite maxA of 1, informed PT after 3rd attempt that he requires +2 assist at [facility]    Ambulation/Gait               General Gait Details: unable to achieve stand at this time        Pertinent Vitals/Pain Pain Assessment: No/denies pain    Home Living Family/patient expects to be discharged to:: Skilled nursing facility                   Additional Comments: [facility]    Prior Function Prior Level of  Function : Needs assist       Physical Assist : Mobility (physical);ADLs (physical) Mobility (physical): Bed mobility;Transfers;Gait ADLs (physical): Feeding;Grooming;Bathing;Dressing;Toileting;IADLs Mobility Comments: pt reports assist of 2 with use of stedy to transfer from bed to chair, is working with PT on LE exercises ADLs Comments: pt reports he can feed himself after set-up, but needs assist for any  mobility or ADLs     Hand Dominance   Dominant Hand: Right    Extremity/Trunk Assessment   Upper Extremity Assessment Upper Extremity Assessment: LUE deficits/detail LUE Deficits / Details: limited to < 90 deg flexion and abduction. good grip strength    Lower Extremity Assessment Lower Extremity Assessment: Generalized weakness (LLE weakness residual from stroke, but pt able to move at ankle. limited to partial ROM against gravity, unable to complete any AROM at hip against gravity)    Cervical / Trunk Assessment Cervical / Trunk Assessment: Kyphotic;Other exceptions Cervical / Trunk Exceptions: large body habitus  Communication   Communication: No difficulties  Cognition Arousal/Alertness: Awake/alert Behavior During Therapy: WFL for tasks assessed/performed Overall Cognitive Status: Impaired/Different from baseline Area of Impairment: Safety/judgement;Problem solving                         Safety/Judgement: Decreased awareness of safety;Decreased awareness of deficits   Problem Solving: Slow processing;Decreased initiation;Difficulty sequencing;Requires verbal cues General Comments: the pt required cues for sequencing all movement, poor insight to actual mobility limitations.        General Comments General comments (skin integrity, edema, etc.): VSS on 2L O2    Exercises     Assessment/Plan    PT Assessment Patient needs continued PT services  PT Problem List Decreased strength;Decreased range of motion;Decreased activity tolerance;Decreased balance;Decreased mobility;Decreased safety awareness       PT Treatment Interventions Gait training;DME instruction;Stair training;Functional mobility training;Therapeutic activities;Therapeutic exercise;Balance training;Patient/family education    PT Goals (Current goals can be found in the Care Plan section)  Acute Rehab PT Goals Patient Stated Goal: to get up to recliner PT Goal Formulation: With  patient Time For Goal Achievement: 07/16/21 Potential to Achieve Goals: Fair    Frequency Min 2X/week    AM-PAC PT "6 Clicks" Mobility  Outcome Measure Help needed turning from your back to your side while in a flat bed without using bedrails?: A Lot Help needed moving from lying on your back to sitting on the side of a flat bed without using bedrails?: A Lot Help needed moving to and from a bed to a chair (including a wheelchair)?: Total Help needed standing up from a chair using your arms (e.g., wheelchair or bedside chair)?: Total Help needed to walk in hospital room?: Total Help needed climbing 3-5 steps with a railing? : Total 6 Click Score: 8    End of Session Equipment Utilized During Treatment: Gait belt;Oxygen Activity Tolerance: Patient tolerated treatment well Patient left: in bed;with call bell/phone within reach;with bed alarm set (chair position) Nurse Communication: Mobility status;Need for lift equipment PT Visit Diagnosis: Other abnormalities of gait and mobility (R26.89);Muscle weakness (generalized) (M62.81)    Time: 1430-1510 PT Time Calculation (min) (ACUTE ONLY): 40 min   Charges:   PT Evaluation $PT Eval Low Complexity: 1 Low PT Treatments $Therapeutic Activity: 23-37 mins        Reginald [name], PT, DPT   Acute Rehabilitation Department Pager #: [phone]  Reginald [name] 07/02/2021, 3:50 PM

## 2021-07-02 NOTE — TOC Initial Note (Addendum)
Transition of Care [facility]) - Initial/Assessment Note    Patient Details  Name: Reginald [name] MRN: [id number] Date of Birth: Jan 04, 1936  [facility] CM/SW Contact:    Bethann Berkshire, [place] Phone Number: 07/02/2021, 2:38 PM  Clinical Narrative:                  Pt is from [place]; per chart review, has been there for multiple years. CSW is informed by MD that pt likely ready for DC after seen by Cardiology. CSW called [facility] liaison to notify and clarify what staff to contact tomorrow for pt return due to holiday weekend. No answer. CSW sent text message to liaison requesting call back and provided update on pt's DC. CSW called [facility] main line and is transferred to admissions. No answer.   1500: Received text back from [facility]. Pt can return tomorrow but Ronney Lion will need DC summary by noon. Erasmo Downer will have the liaison phone [phone]). Liaison stated that a new covid test is not need if pt returns tomorrow.   Expected Discharge Plan: [place]     Patient Goals and CMS Choice        Expected Discharge Plan and Services Expected Discharge Plan: [place] arrangements for the past 2 months: [place]                                      Prior Living Arrangements/Services Living arrangements for the past 2 months: [place] Lives with:: Facility Resident                   Activities of Daily Living      Permission Sought/Granted                  Emotional Assessment         Alcohol / Substance Use: Not Applicable Psych Involvement: No (comment)  Admission diagnosis:  Chest pain [R07.9] Patient Active Problem List   Diagnosis Date Noted   Chest pain 07/01/2021   History of CVA with residual deficit 07/01/2021   Pneumothorax 04/08/2021   Stroke ([place]) 08/05/2020   Vision loss of right eye 06/09/2020   Acute on chronic renal failure ([place])  12/19/2018   Acute renal failure superimposed on stage 4 chronic kidney disease ([place]) 12/19/2018   AKI (acute kidney injury) ([place]) 11/26/2018   HCAP (healthcare-associated pneumonia) 11/15/2018   COVID-19 11/15/2018   Chronic venous insufficiency 10/04/2017   Venous stasis ulcers of both lower extremities ([place]) 10/04/2017   Nonspecific chest pain 10/27/2016   Morbid obesity ([place]) 05/02/2016   Lymphedema 05/02/2016   Abnormal laboratory test result 10/15/2015   Cough 10/08/2015   Hematuria 09/21/2015   Hypokalemia 08/13/2015   Hypertension associated with diabetes ([place]) 08/13/2015   Multiple open wounds of lower leg 07/22/2015   CKD (chronic kidney disease) stage 3, GFR 30-59 ml/min (HCC) 07/22/2015   Cellulitis of leg, right 06/28/2015   Acute kidney injury superimposed on CKD ([place]) 06/28/2015   Hypertensive heart disease with CHF (congestive heart failure) ([place]) 05/19/2015   CAD (coronary artery disease), native coronary artery 05/19/2015   Hyperlipidemia associated with type 2 diabetes mellitus ([place]) 05/19/2015   Vitamin D deficiency 05/19/2015   Pressure ulcer 10/93/2355   Diastolic dysfunction with chronic heart failure ([place]) 05/08/2015   Blisters of  multiple sites 05/08/2015   Type 2 diabetes mellitus ([place])    Obesity, unspecified    OSA (obstructive sleep apnea)    Permanent atrial fibrillation ([place])    Secondary DM with CKD stage 4 and hypertension ([place])    Glaucoma    Cellulitis 05/07/2015   PCP:  [name], MD Pharmacy:   [place], [place] [address] [place] [address] Phone: [phone] Fax: [place], [place] - [address] [place] [address] Phone: [phone] Fax: [phone]     Social Determinants of Health (SDOH) Interventions    Readmission Risk Interventions No flowsheet data found.

## 2021-07-02 NOTE — NC FL2 (Signed)
[place] LEVEL OF CARE SCREENING TOOL     IDENTIFICATION  Patient Name: Reginald [name] Birthdate: [birth year] Sex: male Admission Date (Current Location): 07/01/2021  [facility] and [place] Number:  Herbalist and Address:  [facility], [place] [address], [place], [place] [address]      Provider Number: [id number]  Attending Physician Name and Address:  Sharen Hones, MD  Relative Name and Phone Number:  Trong, [place])   [phone] [facility] Phone)    Current Level of Care: Hospital Recommended Level of Care: [place] Prior Approval Number:    Date Approved/Denied:   PASRR Number: [id number]  Discharge Plan: SNF    Current Diagnoses: Patient Active Problem List   Diagnosis Date Noted   Chest pain 07/01/2021   History of CVA with residual deficit 07/01/2021   Pneumothorax 04/08/2021   Stroke ([place]) 08/05/2020   Vision loss of right eye 06/09/2020   Acute on chronic renal failure ([place]) 12/19/2018   Acute renal failure superimposed on stage 4 chronic kidney disease ([place]) 12/19/2018   AKI (acute kidney injury) ([place]) 11/26/2018   HCAP (healthcare-associated pneumonia) 11/15/2018   COVID-19 11/15/2018   Chronic venous insufficiency 10/04/2017   Venous stasis ulcers of both lower extremities ([place]) 10/04/2017   Nonspecific chest pain 10/27/2016   Morbid obesity ([place]) 05/02/2016   Lymphedema 05/02/2016   Abnormal laboratory test result 10/15/2015   Cough 10/08/2015   Hematuria 09/21/2015   Hypokalemia 08/13/2015   Hypertension associated with diabetes ([place]) 08/13/2015   Multiple open wounds of lower leg 07/22/2015   CKD (chronic kidney disease) stage 3, GFR 30-59 ml/min (HCC) 07/22/2015   Cellulitis of leg, right 06/28/2015   Acute kidney injury superimposed on CKD ([place]) 06/28/2015   Hypertensive heart disease with CHF (congestive heart failure) ([place]) 05/19/2015   CAD (coronary artery disease), native  coronary artery 05/19/2015   Hyperlipidemia associated with type 2 diabetes mellitus ([place]) 05/19/2015   Vitamin D deficiency 05/19/2015   Pressure ulcer 97/41/6384   Diastolic dysfunction with chronic heart failure ([place]) 05/08/2015   Blisters of multiple sites 05/08/2015   Type 2 diabetes mellitus ([place])    Obesity, unspecified    OSA (obstructive sleep apnea)    Permanent atrial fibrillation ([place])    Secondary DM with CKD stage 4 and hypertension ([place])    Glaucoma    Cellulitis 05/07/2015    Orientation RESPIRATION BLADDER Height & Weight     Self, Time, Situation, Place  O2 (2LNC) Incontinent, External catheter Weight: 266 lb (120.7 kg) Height:  5\' 11"  (180.3 cm)  BEHAVIORAL SYMPTOMS/MOOD NEUROLOGICAL BOWEL NUTRITION STATUS      Continent Diet (see d/c summary)  AMBULATORY STATUS COMMUNICATION OF NEEDS Skin     Verbally Normal                       Personal Care Assistance Level of Assistance              Functional Limitations Info  Sight, Hearing, Speech Sight Info: Impaired Hearing Info: Impaired Speech Info: Adequate    SPECIAL CARE FACTORS FREQUENCY                       Contractures Contractures Info: Not present    Additional Factors Info  Code Status, Allergies Code Status Info: Full code Allergies Info: Adhesive(tape)           Current Medications (07/02/2021):  This is the current hospital active medication list Current Facility-Administered Medications  Medication Dose Route Frequency Provider Last Rate Last Admin   acetaminophen (TYLENOL) tablet 650 mg  650 mg Oral Q6H PRN Lenore Cordia, MD       Or   acetaminophen (TYLENOL) suppository 650 mg  650 mg Rectal Q6H PRN Lenore Cordia, MD       albuterol (PROVENTIL) (2.5 MG/3ML) 0.083% nebulizer solution 2.5 mg  2.5 mg Inhalation BID PRN Lenore Cordia, MD       apixaban (ELIQUIS) tablet 5 mg  5 mg Oral BID Sharen Hones, MD       aspirin EC tablet 81 mg  81 mg Oral Daily Lenore Cordia, MD   81 mg at 07/02/21 0850   atorvastatin (LIPITOR) tablet 40 mg  40 mg Oral QHS Zada Finders R, MD   40 mg at 07/01/21 2325   dorzolamide-timolol (COSOPT) 22.3-6.8 MG/ML ophthalmic solution 1 drop  1 drop Both Eyes BID Lenore Cordia, MD   1 drop at 07/02/21 0851   gabapentin (NEURONTIN) capsule 200 mg  200 mg Oral QHS Zada Finders R, MD   200 mg at 07/01/21 2325   icosapent Ethyl (VASCEPA) 1 g capsule 2 g  2 g Oral QPM Zada Finders R, MD   2 g at 07/01/21 2326   insulin aspart (novoLOG) injection 0-9 Units  0-9 Units Subcutaneous TID WC Lenore Cordia, MD   1 Units at 07/02/21 0850   latanoprost (XALATAN) 0.005 % ophthalmic solution 1 drop  1 drop Both Eyes QHS Zada Finders R, MD   1 drop at 07/01/21 2329   melatonin tablet 5 mg  5 mg Oral QHS Zada Finders R, MD   5 mg at 07/01/21 2325   metoprolol succinate (TOPROL-XL) 24 hr tablet 12.5 mg  12.5 mg Oral Daily Zada Finders R, MD   12.5 mg at 07/02/21 0851   nitroGLYCERIN (NITROSTAT) SL tablet 0.4 mg  0.4 mg Sublingual Q5 min PRN Lenore Cordia, MD       ondansetron (ZOFRAN) tablet 4 mg  4 mg Oral Q6H PRN Lenore Cordia, MD       Or   ondansetron (ZOFRAN) injection 4 mg  4 mg Intravenous Q6H PRN Lenore Cordia, MD       senna-docusate (Senokot-S) tablet 1 tablet  1 tablet Oral QHS PRN Lenore Cordia, MD       tamsulosin (FLOMAX) capsule 0.4 mg  0.4 mg Oral QPC supper Lenore Cordia, MD   0.4 mg at 07/01/21 2325     Discharge Medications: Please see discharge summary for a list of discharge medications.  Relevant Imaging Results:  Relevant Lab Results:   Additional Information SS#: [id number]  Bethann Berkshire, LCSW

## 2021-07-02 NOTE — Plan of Care (Signed)

## 2021-07-02 NOTE — Care Management Obs Status (Signed)
[place] NOTIFICATION   Patient Details  Name: BERTIE SIMIEN MRN: [id number] Date of Birth: 1936-02-05   Medicare Observation Status Notification Given:  Yes    Bethena Roys, RN 07/02/2021, 4:55 PM

## 2021-07-02 NOTE — Progress Notes (Signed)
Patient refused CPAP for the evening.
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
## 2021-07-03 DIAGNOSIS — I25119 Atherosclerotic heart disease of native coronary artery with unspecified angina pectoris: Secondary | ICD-10-CM | POA: Diagnosis not present

## 2021-07-03 DIAGNOSIS — R079 Chest pain, unspecified: Secondary | ICD-10-CM | POA: Diagnosis not present

## 2021-07-03 DIAGNOSIS — N189 Chronic kidney disease, unspecified: Secondary | ICD-10-CM | POA: Diagnosis not present

## 2021-07-03 DIAGNOSIS — N179 Acute kidney failure, unspecified: Secondary | ICD-10-CM | POA: Diagnosis not present

## 2021-07-03 LAB — GLUCOSE, CAPILLARY
Glucose-Capillary: 146 mg/dL — ABNORMAL HIGH (ref 70–99)
Glucose-Capillary: 121 mg/dL — ABNORMAL HIGH (ref 70–99)
Glucose-Capillary: 133 mg/dL — ABNORMAL HIGH (ref 70–99)
Glucose-Capillary: 140 mg/dL — ABNORMAL HIGH (ref 70–99)

## 2021-07-03 MED ORDER — ALBUTEROL SULFATE HFA 108 (90 BASE) MCG/ACT IN AERS: 2.0000 | INHALATION_SPRAY | Freq: Two times a day (BID) | RESPIRATORY_TRACT | Status: DC | PRN

## 2021-07-03 NOTE — TOC Transition Note (Signed)
Transition of Care [facility]) - CM/SW Discharge Note   Patient Details  Name: Reginald [name] MRN: [id number] Date of Birth: 1935/08/15  Transition of Care [facility]) CM/SW Contact:  Reginald Castilla, LCSW Phone Number: [phone] 07/03/2021, 12:22 PM   Clinical Narrative:     Patient will DC to: [facility]? Anticipated DC date:?07/03/2021 Family notified:?son- Insurance claims handler by: Reginald [name]   Per MD patient ready for DC to [facility]. RN, patient, patient's family, and facility notified of DC. Discharge Summary sent to facility. RN given number for report [phone] room [id number] . DC packet on chart. Ambulance transport requested for patient.   CSW signing off.   Reginald [name], Reginald [name] [phone]   Final next level of care: Skilled Nursing Facility Barriers to Discharge: Barriers Resolved   Patient Goals and CMS Choice        Discharge Placement              Patient chooses bed at: [facility] Patient to be transferred to facility by: [facility] Name of family member notified: Reginald [name] Patient and family notified of of transfer: 07/03/21  Discharge Plan and Services                                     Social Determinants of Health (SDOH) Interventions     Readmission Risk Interventions No flowsheet data found.

## 2021-07-03 NOTE — Plan of Care (Signed)
  Problem: Education: Goal: Knowledge of General Education information will improve Description Including pain rating scale, medication(s)/side effects and non-pharmacologic comfort measures Outcome: Progressing   

## 2021-07-03 NOTE — Progress Notes (Signed)
Attempted to call report to SNF transferred to nurses station x 2 no answer.   Rickardo Brinegar, Tivis Ringer, RN

## 2021-07-03 NOTE — Discharge Summary (Signed)
Physician Discharge Summary  Patient ID: Reginald [name] MRN: [id number] DOB/AGE: 12/17/1935 85 y.o.  Admit date: 07/01/2021 Discharge date: 07/03/2021  Admission Diagnoses:  Discharge Diagnoses:  Principal Problem:   Chest pain Active Problems:   Type 2 diabetes mellitus (HCC)   OSA (obstructive sleep apnea)   Permanent atrial fibrillation (HCC)   CAD (coronary artery disease), native coronary artery   Hyperlipidemia associated with type 2 diabetes mellitus ([place])   Acute kidney injury superimposed on CKD ([place])   Hypertension associated with diabetes ([place])   History of CVA with residual deficit   Discharged Condition: fair  Hospital Course:  Reginald [name] is a 85 y.o. male with medical history significant for CAD s/p prior PCI to LAD in 1997, permanent atrial fibrillation on Eliquis, history of CVA with residual left-sided weakness, carotid artery and peripheral vascular disease, T2DM, HTN, HLD, CKD stage IIIa, and OSA on CPAP who presented to the ED for evaluation of chest pain.  Chest pain, other. Coronary artery disease status post PCI. Patient has multiple risk factors for CAD. Troponin peaked at 23, not significant elevated. Patient is a seen by cardiology, no need for additional work-up as this is unlikely to be cardiac related.  At this point, he is medically stable to be discharged, he will follow-up with his PCP and cardiology in the near future.   Acute kidney injury on CKD stage IIIa: Renal function improved after giving gentle rehydration.  Permanent atrial fibrillation. Continue beta-blocker and Eliquis.  COPD Stable, no bronchospasm  History of CVA with residual left-sided weakness Continue physical therapy and nursing home.   Type 2 diabetes. Resume outpatient treatment.  Essential hypertension.  Obstruct sleep apnea. Continue CPAP nightly.  Consults: cardiology  Significant Diagnostic Studies:   Treatments: cardiac monitoring  Discharge  Exam: Blood pressure (!) 130/57, pulse 84, temperature 97.6 F (36.4 C), temperature source Oral, resp. rate 19, height 5\' 11"  (1.803 m), weight 124.2 kg, SpO2 93 %. General appearance: alert and cooperative Resp: clear to auscultation bilaterally Cardio: regular rate and rhythm, S1, S2 normal, no murmur, click, rub or gallop GI: soft, non-tender; bowel sounds normal; no masses,  no organomegaly Extremities: extremities normal, atraumatic, no cyanosis or edema  Disposition: Discharge disposition: 03-Skilled Nursing Facility       Discharge Instructions     Diet - low sodium heart healthy   Complete by: As directed    Increase activity slowly   Complete by: As directed       Allergies as of 07/03/2021       Reactions   Adhesive [tape] Rash        Medication List     TAKE these medications    acetaminophen 325 MG tablet Commonly known as: TYLENOL Take 650 mg by mouth every 4 (four) hours as needed for moderate pain (for pain).   albuterol 108 (90 Base) MCG/ACT inhaler Commonly known as: VENTOLIN HFA Inhale 2 puffs into the lungs 2 (two) times daily as needed for wheezing or shortness of breath.   apixaban 5 MG Tabs tablet Commonly known as: ELIQUIS Take 1 tablet (5 mg total) by mouth 2 (two) times daily.   ARTIFICIAL TEARS PF OP Place 1 drop into both eyes 4 (four) times daily.   aspirin 81 MG EC tablet Take 1 tablet (81 mg total) by mouth daily. Swallow whole.   atorvastatin 40 MG tablet Commonly known as: LIPITOR Take 40 mg by mouth at bedtime.   Biofreeze 4 % Gel  Generic drug: Menthol (Topical Analgesic) Apply 1 application topically in the morning and at bedtime. Apply to neck/shoulder   dorzolamide-timolol 22.3-6.8 MG/ML ophthalmic solution Commonly known as: COSOPT Place 1 drop into both eyes 2 (two) times daily.   eucerin cream Apply 1 application topically daily.   fexofenadine 60 MG tablet Commonly known as: ALLEGRA Take 60 mg by mouth  daily.   gabapentin 100 MG capsule Commonly known as: NEURONTIN Take 2 capsules (200 mg total) by mouth at bedtime as needed. What changed: when to take this   hydrALAZINE 25 MG tablet Commonly known as: APRESOLINE Take 12.5 mg by mouth 2 (two) times daily.   hydrALAZINE 10 MG tablet Commonly known as: APRESOLINE Take 10 mg by mouth daily as needed (sbp >160).   latanoprost 0.005 % ophthalmic solution Commonly known as: XALATAN Place 1 drop into both eyes at bedtime.   losartan 25 MG tablet Commonly known as: COZAAR Take 50 mg by mouth daily.   magnesium oxide 400 MG tablet Commonly known as: MAG-OX Take 400 mg by mouth daily.   melatonin 5 MG Tabs Take 5 mg by mouth at bedtime.   metoprolol succinate 25 MG 24 hr tablet Commonly known as: Toprol XL Take 0.5 tablets (12.5 mg total) by mouth daily.   nitroGLYCERIN 0.4 MG SL tablet Commonly known as: NITROSTAT Place 0.4 mg under the tongue every 5 (five) minutes x 3 doses as needed for chest pain (and CALL PROVIDER IF NO RELIEF AFTER THE 3RD DOSE).   OXYGEN Inhale 2 L into the lungs at bedtime.   Ozempic (1 MG/DOSE) 4 MG/3ML Sopn Generic drug: Semaglutide (1 MG/DOSE) Inject 1 mg into the skin every Monday.   polyethylene glycol 17 g packet Commonly known as: MIRALAX / GLYCOLAX Take 17 g by mouth daily.   repaglinide 0.5 MG tablet Commonly known as: PRANDIN Take 0.5 mg by mouth every evening.   sennosides-docusate sodium 8.6-50 MG tablet Commonly known as: SENOKOT-S Take 2 tablets by mouth 2 (two) times daily.   tamsulosin 0.4 MG Caps capsule Commonly known as: FLOMAX Take 1 capsule (0.4 mg total) by mouth daily after supper.   Vascepa 1 g capsule Generic drug: icosapent Ethyl Take 1 g by mouth every evening.   vitamin B-12 1000 MCG tablet Commonly known as: CYANOCOBALAMIN Take 500 mcg by mouth daily.   Vitamin D-3 25 MCG (1000 UT) Caps Take 2,000 Units by mouth daily.        Follow-up  Information     [name], MD Follow up in 1 week(s).   Specialty: Family Medicine Contact information: [place] [address] [phone]         Minna Merritts, MD Follow up in 2 week(s).   Specialty: Cardiology Contact information: [place] [address] [phone]                 Signed: Sharen Hones 07/03/2021, 9:49 AM

## 2021-07-03 NOTE — TOC Progression Note (Addendum)
Transition of Care [facility]) - Progression Note    Patient Details  Name: Reginald [name] MRN: [id number] Date of Birth: Jul 04, 1936  Transition of Care [facility]) CM/SW [place], LCSW Phone Number:[phone] 07/03/2021, 11:56 AM  Clinical Narrative:    CSW attempted to call the admission phone several times and the facility and was unable to reach anyone.  CSW called facility again and was able to speak with RN on pt's floor and she stated that she was unaware that pt was returning today. CSW explained that it was confirmed on yesterday that pt would be returning. CSW informed her that the DC summary was sent over th hub. RN stated that if I could send the DC printed copy with him as well.  CSW will send printed copy of DC with pt.  12;10PM- spoke with Erasmo Downer from [facility] and she will provide the staff with DC summary and is aware that he will be dc there today.   TOC team will continue to assist with discharge planning needs.    Expected Discharge Plan: [place]    Expected Discharge Plan and Services Expected Discharge Plan: [place] arrangements for the past 2 months: [place] Expected Discharge Date: 07/03/21                                     Social Determinants of Health (SDOH) Interventions    Readmission Risk Interventions No flowsheet data found.

## 2021-07-03 NOTE — Progress Notes (Signed)
Patient refused CPAP for the night  

## 2021-07-04 DIAGNOSIS — Z79899 Other long term (current) drug therapy: Secondary | ICD-10-CM | POA: Diagnosis not present

## 2021-07-04 DIAGNOSIS — I872 Venous insufficiency (chronic) (peripheral): Secondary | ICD-10-CM | POA: Diagnosis not present

## 2021-07-04 DIAGNOSIS — N184 Chronic kidney disease, stage 4 (severe): Secondary | ICD-10-CM | POA: Diagnosis not present

## 2021-07-04 DIAGNOSIS — M6258 Muscle wasting and atrophy, not elsewhere classified, other site: Secondary | ICD-10-CM | POA: Diagnosis not present

## 2021-07-04 DIAGNOSIS — M6281 Muscle weakness (generalized): Secondary | ICD-10-CM | POA: Diagnosis not present

## 2021-07-04 DIAGNOSIS — I4891 Unspecified atrial fibrillation: Secondary | ICD-10-CM | POA: Diagnosis not present

## 2021-07-04 DIAGNOSIS — E559 Vitamin D deficiency, unspecified: Secondary | ICD-10-CM | POA: Diagnosis not present

## 2021-07-04 DIAGNOSIS — I5032 Chronic diastolic (congestive) heart failure: Secondary | ICD-10-CM | POA: Diagnosis not present

## 2021-07-04 DIAGNOSIS — R262 Difficulty in walking, not elsewhere classified: Secondary | ICD-10-CM | POA: Diagnosis not present

## 2021-07-04 DIAGNOSIS — H4010X0 Unspecified open-angle glaucoma, stage unspecified: Secondary | ICD-10-CM | POA: Diagnosis not present

## 2021-07-04 DIAGNOSIS — E870 Hyperosmolality and hypernatremia: Secondary | ICD-10-CM | POA: Diagnosis not present

## 2021-07-04 DIAGNOSIS — J449 Chronic obstructive pulmonary disease, unspecified: Secondary | ICD-10-CM | POA: Diagnosis not present

## 2021-07-04 DIAGNOSIS — K625 Hemorrhage of anus and rectum: Secondary | ICD-10-CM | POA: Diagnosis not present

## 2021-07-04 DIAGNOSIS — R41841 Cognitive communication deficit: Secondary | ICD-10-CM | POA: Diagnosis not present

## 2021-07-04 DIAGNOSIS — I2510 Atherosclerotic heart disease of native coronary artery without angina pectoris: Secondary | ICD-10-CM | POA: Diagnosis not present

## 2021-07-04 DIAGNOSIS — R498 Other voice and resonance disorders: Secondary | ICD-10-CM | POA: Diagnosis not present

## 2021-07-04 DIAGNOSIS — J302 Other seasonal allergic rhinitis: Secondary | ICD-10-CM | POA: Diagnosis not present

## 2021-07-04 DIAGNOSIS — Z23 Encounter for immunization: Secondary | ICD-10-CM | POA: Diagnosis not present

## 2021-07-04 DIAGNOSIS — R634 Abnormal weight loss: Secondary | ICD-10-CM | POA: Diagnosis not present

## 2021-07-04 DIAGNOSIS — U071 COVID-19: Secondary | ICD-10-CM | POA: Diagnosis not present

## 2021-07-04 DIAGNOSIS — M2550 Pain in unspecified joint: Secondary | ICD-10-CM | POA: Diagnosis not present

## 2021-07-04 DIAGNOSIS — Z7401 Bed confinement status: Secondary | ICD-10-CM | POA: Diagnosis not present

## 2021-07-04 DIAGNOSIS — E1159 Type 2 diabetes mellitus with other circulatory complications: Secondary | ICD-10-CM | POA: Diagnosis not present

## 2021-07-04 DIAGNOSIS — R4189 Other symptoms and signs involving cognitive functions and awareness: Secondary | ICD-10-CM | POA: Diagnosis not present

## 2021-07-04 DIAGNOSIS — Z8673 Personal history of transient ischemic attack (TIA), and cerebral infarction without residual deficits: Secondary | ICD-10-CM | POA: Diagnosis not present

## 2021-07-04 DIAGNOSIS — G4733 Obstructive sleep apnea (adult) (pediatric): Secondary | ICD-10-CM | POA: Diagnosis not present

## 2021-07-04 DIAGNOSIS — Z7901 Long term (current) use of anticoagulants: Secondary | ICD-10-CM | POA: Diagnosis not present

## 2021-07-04 DIAGNOSIS — N179 Acute kidney failure, unspecified: Secondary | ICD-10-CM | POA: Diagnosis not present

## 2021-07-04 DIAGNOSIS — G459 Transient cerebral ischemic attack, unspecified: Secondary | ICD-10-CM | POA: Diagnosis not present

## 2021-07-04 DIAGNOSIS — R0789 Other chest pain: Secondary | ICD-10-CM | POA: Diagnosis not present

## 2021-07-04 DIAGNOSIS — R222 Localized swelling, mass and lump, trunk: Secondary | ICD-10-CM | POA: Diagnosis not present

## 2021-07-04 DIAGNOSIS — E0841 Diabetes mellitus due to underlying condition with diabetic mononeuropathy: Secondary | ICD-10-CM | POA: Diagnosis not present

## 2021-07-04 DIAGNOSIS — G8114 Spastic hemiplegia affecting left nondominant side: Secondary | ICD-10-CM | POA: Diagnosis not present

## 2021-07-04 DIAGNOSIS — K5901 Slow transit constipation: Secondary | ICD-10-CM | POA: Diagnosis not present

## 2021-07-04 DIAGNOSIS — E1122 Type 2 diabetes mellitus with diabetic chronic kidney disease: Secondary | ICD-10-CM | POA: Diagnosis not present

## 2021-07-04 DIAGNOSIS — H5461 Unqualified visual loss, right eye, normal vision left eye: Secondary | ICD-10-CM | POA: Diagnosis not present

## 2021-07-04 DIAGNOSIS — R0989 Other specified symptoms and signs involving the circulatory and respiratory systems: Secondary | ICD-10-CM | POA: Diagnosis not present

## 2021-07-04 DIAGNOSIS — R2681 Unsteadiness on feet: Secondary | ICD-10-CM | POA: Diagnosis not present

## 2021-07-04 DIAGNOSIS — I69318 Other symptoms and signs involving cognitive functions following cerebral infarction: Secondary | ICD-10-CM | POA: Diagnosis not present

## 2021-07-04 DIAGNOSIS — H1031 Unspecified acute conjunctivitis, right eye: Secondary | ICD-10-CM | POA: Diagnosis not present

## 2021-07-04 DIAGNOSIS — E662 Morbid (severe) obesity with alveolar hypoventilation: Secondary | ICD-10-CM | POA: Diagnosis not present

## 2021-07-04 DIAGNOSIS — K5900 Constipation, unspecified: Secondary | ICD-10-CM | POA: Diagnosis not present

## 2021-07-04 DIAGNOSIS — F0390 Unspecified dementia without behavioral disturbance: Secondary | ICD-10-CM | POA: Diagnosis not present

## 2021-07-04 DIAGNOSIS — R1312 Dysphagia, oropharyngeal phase: Secondary | ICD-10-CM | POA: Diagnosis not present

## 2021-07-04 DIAGNOSIS — H3411 Central retinal artery occlusion, right eye: Secondary | ICD-10-CM | POA: Diagnosis not present

## 2021-07-04 DIAGNOSIS — N183 Chronic kidney disease, stage 3 unspecified: Secondary | ICD-10-CM | POA: Diagnosis not present

## 2021-07-04 DIAGNOSIS — R079 Chest pain, unspecified: Secondary | ICD-10-CM | POA: Diagnosis not present

## 2021-07-04 DIAGNOSIS — R278 Other lack of coordination: Secondary | ICD-10-CM | POA: Diagnosis not present

## 2021-07-04 DIAGNOSIS — R2689 Other abnormalities of gait and mobility: Secondary | ICD-10-CM | POA: Diagnosis not present

## 2021-07-04 DIAGNOSIS — I152 Hypertension secondary to endocrine disorders: Secondary | ICD-10-CM | POA: Diagnosis not present

## 2021-07-04 DIAGNOSIS — L97509 Non-pressure chronic ulcer of other part of unspecified foot with unspecified severity: Secondary | ICD-10-CM | POA: Diagnosis not present

## 2021-07-04 DIAGNOSIS — M6259 Muscle wasting and atrophy, not elsewhere classified, multiple sites: Secondary | ICD-10-CM | POA: Diagnosis not present

## 2021-07-04 DIAGNOSIS — I639 Cerebral infarction, unspecified: Secondary | ICD-10-CM | POA: Diagnosis not present

## 2021-07-04 DIAGNOSIS — I739 Peripheral vascular disease, unspecified: Secondary | ICD-10-CM | POA: Diagnosis not present

## 2021-07-06 DIAGNOSIS — I639 Cerebral infarction, unspecified: Secondary | ICD-10-CM | POA: Diagnosis not present

## 2021-07-06 DIAGNOSIS — N179 Acute kidney failure, unspecified: Secondary | ICD-10-CM | POA: Diagnosis not present

## 2021-07-06 DIAGNOSIS — N183 Chronic kidney disease, stage 3 unspecified: Secondary | ICD-10-CM | POA: Diagnosis not present

## 2021-07-06 DIAGNOSIS — I152 Hypertension secondary to endocrine disorders: Secondary | ICD-10-CM | POA: Diagnosis not present

## 2021-07-06 DIAGNOSIS — E662 Morbid (severe) obesity with alveolar hypoventilation: Secondary | ICD-10-CM | POA: Diagnosis not present

## 2021-07-06 DIAGNOSIS — I4891 Unspecified atrial fibrillation: Secondary | ICD-10-CM | POA: Diagnosis not present

## 2021-07-06 DIAGNOSIS — R0989 Other specified symptoms and signs involving the circulatory and respiratory systems: Secondary | ICD-10-CM | POA: Diagnosis not present

## 2021-07-06 DIAGNOSIS — I5032 Chronic diastolic (congestive) heart failure: Secondary | ICD-10-CM | POA: Diagnosis not present

## 2021-07-06 DIAGNOSIS — I2510 Atherosclerotic heart disease of native coronary artery without angina pectoris: Secondary | ICD-10-CM | POA: Diagnosis not present

## 2021-07-06 DIAGNOSIS — E1159 Type 2 diabetes mellitus with other circulatory complications: Secondary | ICD-10-CM | POA: Diagnosis not present

## 2021-07-06 DIAGNOSIS — R0789 Other chest pain: Secondary | ICD-10-CM | POA: Diagnosis not present

## 2021-07-11 DIAGNOSIS — R634 Abnormal weight loss: Secondary | ICD-10-CM | POA: Diagnosis not present

## 2021-07-11 DIAGNOSIS — R2681 Unsteadiness on feet: Secondary | ICD-10-CM | POA: Diagnosis not present

## 2021-07-11 DIAGNOSIS — I69318 Other symptoms and signs involving cognitive functions following cerebral infarction: Secondary | ICD-10-CM | POA: Diagnosis not present

## 2021-07-11 DIAGNOSIS — Z7901 Long term (current) use of anticoagulants: Secondary | ICD-10-CM | POA: Diagnosis not present

## 2021-07-11 DIAGNOSIS — R498 Other voice and resonance disorders: Secondary | ICD-10-CM | POA: Diagnosis not present

## 2021-07-11 DIAGNOSIS — K5900 Constipation, unspecified: Secondary | ICD-10-CM | POA: Diagnosis not present

## 2021-07-11 DIAGNOSIS — J449 Chronic obstructive pulmonary disease, unspecified: Secondary | ICD-10-CM | POA: Diagnosis not present

## 2021-07-11 DIAGNOSIS — I639 Cerebral infarction, unspecified: Secondary | ICD-10-CM | POA: Diagnosis not present

## 2021-07-11 DIAGNOSIS — I4891 Unspecified atrial fibrillation: Secondary | ICD-10-CM | POA: Diagnosis not present

## 2021-07-11 DIAGNOSIS — I152 Hypertension secondary to endocrine disorders: Secondary | ICD-10-CM | POA: Diagnosis not present

## 2021-07-11 DIAGNOSIS — M6258 Muscle wasting and atrophy, not elsewhere classified, other site: Secondary | ICD-10-CM | POA: Diagnosis not present

## 2021-07-11 DIAGNOSIS — H4010X0 Unspecified open-angle glaucoma, stage unspecified: Secondary | ICD-10-CM | POA: Diagnosis not present

## 2021-07-11 DIAGNOSIS — E1159 Type 2 diabetes mellitus with other circulatory complications: Secondary | ICD-10-CM | POA: Diagnosis not present

## 2021-07-11 DIAGNOSIS — Z23 Encounter for immunization: Secondary | ICD-10-CM | POA: Diagnosis not present

## 2021-07-11 DIAGNOSIS — R079 Chest pain, unspecified: Secondary | ICD-10-CM | POA: Diagnosis not present

## 2021-07-11 DIAGNOSIS — G4733 Obstructive sleep apnea (adult) (pediatric): Secondary | ICD-10-CM | POA: Diagnosis not present

## 2021-07-11 DIAGNOSIS — E1122 Type 2 diabetes mellitus with diabetic chronic kidney disease: Secondary | ICD-10-CM | POA: Diagnosis not present

## 2021-07-11 DIAGNOSIS — H3411 Central retinal artery occlusion, right eye: Secondary | ICD-10-CM | POA: Diagnosis not present

## 2021-07-11 DIAGNOSIS — K625 Hemorrhage of anus and rectum: Secondary | ICD-10-CM | POA: Diagnosis not present

## 2021-07-11 DIAGNOSIS — H5461 Unqualified visual loss, right eye, normal vision left eye: Secondary | ICD-10-CM | POA: Diagnosis not present

## 2021-07-11 DIAGNOSIS — R222 Localized swelling, mass and lump, trunk: Secondary | ICD-10-CM | POA: Diagnosis not present

## 2021-07-11 DIAGNOSIS — L97509 Non-pressure chronic ulcer of other part of unspecified foot with unspecified severity: Secondary | ICD-10-CM | POA: Diagnosis not present

## 2021-07-11 DIAGNOSIS — I872 Venous insufficiency (chronic) (peripheral): Secondary | ICD-10-CM | POA: Diagnosis not present

## 2021-07-11 DIAGNOSIS — E0841 Diabetes mellitus due to underlying condition with diabetic mononeuropathy: Secondary | ICD-10-CM | POA: Diagnosis not present

## 2021-07-11 DIAGNOSIS — R2689 Other abnormalities of gait and mobility: Secondary | ICD-10-CM | POA: Diagnosis not present

## 2021-07-11 DIAGNOSIS — J302 Other seasonal allergic rhinitis: Secondary | ICD-10-CM | POA: Diagnosis not present

## 2021-07-11 DIAGNOSIS — R4189 Other symptoms and signs involving cognitive functions and awareness: Secondary | ICD-10-CM | POA: Diagnosis not present

## 2021-07-11 DIAGNOSIS — I2510 Atherosclerotic heart disease of native coronary artery without angina pectoris: Secondary | ICD-10-CM | POA: Diagnosis not present

## 2021-07-11 DIAGNOSIS — U071 COVID-19: Secondary | ICD-10-CM | POA: Diagnosis not present

## 2021-07-11 DIAGNOSIS — Z79899 Other long term (current) drug therapy: Secondary | ICD-10-CM | POA: Diagnosis not present

## 2021-07-11 DIAGNOSIS — R278 Other lack of coordination: Secondary | ICD-10-CM | POA: Diagnosis not present

## 2021-07-11 DIAGNOSIS — I5032 Chronic diastolic (congestive) heart failure: Secondary | ICD-10-CM | POA: Diagnosis not present

## 2021-07-11 DIAGNOSIS — N183 Chronic kidney disease, stage 3 unspecified: Secondary | ICD-10-CM | POA: Diagnosis not present

## 2021-07-11 DIAGNOSIS — R262 Difficulty in walking, not elsewhere classified: Secondary | ICD-10-CM | POA: Diagnosis not present

## 2021-07-11 DIAGNOSIS — N184 Chronic kidney disease, stage 4 (severe): Secondary | ICD-10-CM | POA: Diagnosis not present

## 2021-07-11 DIAGNOSIS — M6259 Muscle wasting and atrophy, not elsewhere classified, multiple sites: Secondary | ICD-10-CM | POA: Diagnosis not present

## 2021-07-11 DIAGNOSIS — K5901 Slow transit constipation: Secondary | ICD-10-CM | POA: Diagnosis not present

## 2021-07-11 DIAGNOSIS — M6281 Muscle weakness (generalized): Secondary | ICD-10-CM | POA: Diagnosis not present

## 2021-07-11 DIAGNOSIS — F0390 Unspecified dementia without behavioral disturbance: Secondary | ICD-10-CM | POA: Diagnosis not present

## 2021-07-11 DIAGNOSIS — G8114 Spastic hemiplegia affecting left nondominant side: Secondary | ICD-10-CM | POA: Diagnosis not present

## 2021-07-11 DIAGNOSIS — E870 Hyperosmolality and hypernatremia: Secondary | ICD-10-CM | POA: Diagnosis not present

## 2021-07-11 DIAGNOSIS — I739 Peripheral vascular disease, unspecified: Secondary | ICD-10-CM | POA: Diagnosis not present

## 2021-07-11 DIAGNOSIS — H1031 Unspecified acute conjunctivitis, right eye: Secondary | ICD-10-CM | POA: Diagnosis not present

## 2021-07-11 DIAGNOSIS — R1312 Dysphagia, oropharyngeal phase: Secondary | ICD-10-CM | POA: Diagnosis not present

## 2021-07-11 DIAGNOSIS — Z8673 Personal history of transient ischemic attack (TIA), and cerebral infarction without residual deficits: Secondary | ICD-10-CM | POA: Diagnosis not present

## 2021-07-11 DIAGNOSIS — R41841 Cognitive communication deficit: Secondary | ICD-10-CM | POA: Diagnosis not present

## 2021-07-11 DIAGNOSIS — E559 Vitamin D deficiency, unspecified: Secondary | ICD-10-CM | POA: Diagnosis not present

## 2021-07-14 DIAGNOSIS — I152 Hypertension secondary to endocrine disorders: Secondary | ICD-10-CM | POA: Diagnosis not present

## 2021-07-14 DIAGNOSIS — H1031 Unspecified acute conjunctivitis, right eye: Secondary | ICD-10-CM | POA: Diagnosis not present

## 2021-07-14 DIAGNOSIS — R222 Localized swelling, mass and lump, trunk: Secondary | ICD-10-CM | POA: Diagnosis not present

## 2021-07-14 DIAGNOSIS — I2510 Atherosclerotic heart disease of native coronary artery without angina pectoris: Secondary | ICD-10-CM | POA: Diagnosis not present

## 2021-07-14 DIAGNOSIS — Z79899 Other long term (current) drug therapy: Secondary | ICD-10-CM | POA: Diagnosis not present

## 2021-08-02 DIAGNOSIS — E1159 Type 2 diabetes mellitus with other circulatory complications: Secondary | ICD-10-CM | POA: Diagnosis not present

## 2021-08-02 DIAGNOSIS — I5032 Chronic diastolic (congestive) heart failure: Secondary | ICD-10-CM | POA: Diagnosis not present

## 2021-08-02 DIAGNOSIS — J302 Other seasonal allergic rhinitis: Secondary | ICD-10-CM | POA: Diagnosis not present

## 2021-08-02 DIAGNOSIS — H4010X0 Unspecified open-angle glaucoma, stage unspecified: Secondary | ICD-10-CM | POA: Diagnosis not present

## 2021-08-02 DIAGNOSIS — R634 Abnormal weight loss: Secondary | ICD-10-CM | POA: Diagnosis not present

## 2021-08-02 DIAGNOSIS — E870 Hyperosmolality and hypernatremia: Secondary | ICD-10-CM | POA: Diagnosis not present

## 2021-08-02 DIAGNOSIS — N184 Chronic kidney disease, stage 4 (severe): Secondary | ICD-10-CM | POA: Diagnosis not present

## 2021-08-02 DIAGNOSIS — K5901 Slow transit constipation: Secondary | ICD-10-CM | POA: Diagnosis not present

## 2021-08-02 DIAGNOSIS — E0841 Diabetes mellitus due to underlying condition with diabetic mononeuropathy: Secondary | ICD-10-CM | POA: Diagnosis not present

## 2021-08-02 DIAGNOSIS — E559 Vitamin D deficiency, unspecified: Secondary | ICD-10-CM | POA: Diagnosis not present

## 2021-08-10 DIAGNOSIS — I4891 Unspecified atrial fibrillation: Secondary | ICD-10-CM | POA: Diagnosis not present

## 2021-08-10 DIAGNOSIS — J449 Chronic obstructive pulmonary disease, unspecified: Secondary | ICD-10-CM | POA: Diagnosis not present

## 2021-08-10 DIAGNOSIS — I152 Hypertension secondary to endocrine disorders: Secondary | ICD-10-CM | POA: Diagnosis not present

## 2021-08-10 DIAGNOSIS — I739 Peripheral vascular disease, unspecified: Secondary | ICD-10-CM | POA: Diagnosis not present

## 2021-08-10 DIAGNOSIS — F0390 Unspecified dementia without behavioral disturbance: Secondary | ICD-10-CM | POA: Diagnosis not present

## 2021-08-10 DIAGNOSIS — I5032 Chronic diastolic (congestive) heart failure: Secondary | ICD-10-CM | POA: Diagnosis not present

## 2021-08-10 DIAGNOSIS — E1159 Type 2 diabetes mellitus with other circulatory complications: Secondary | ICD-10-CM | POA: Diagnosis not present

## 2021-08-19 ENCOUNTER — Ambulatory Visit: Payer: Medicare Other | Admitting: Adult Health

## 2021-08-26 DIAGNOSIS — U071 COVID-19: Secondary | ICD-10-CM | POA: Diagnosis not present

## 2021-08-26 DIAGNOSIS — I872 Venous insufficiency (chronic) (peripheral): Secondary | ICD-10-CM | POA: Diagnosis not present

## 2021-08-26 DIAGNOSIS — I69318 Other symptoms and signs involving cognitive functions following cerebral infarction: Secondary | ICD-10-CM | POA: Diagnosis not present

## 2021-08-26 DIAGNOSIS — G4733 Obstructive sleep apnea (adult) (pediatric): Secondary | ICD-10-CM | POA: Diagnosis not present

## 2021-08-26 DIAGNOSIS — R4189 Other symptoms and signs involving cognitive functions and awareness: Secondary | ICD-10-CM | POA: Diagnosis not present

## 2021-08-26 DIAGNOSIS — I152 Hypertension secondary to endocrine disorders: Secondary | ICD-10-CM | POA: Diagnosis not present

## 2021-08-26 DIAGNOSIS — E1122 Type 2 diabetes mellitus with diabetic chronic kidney disease: Secondary | ICD-10-CM | POA: Diagnosis not present

## 2021-08-26 DIAGNOSIS — E1159 Type 2 diabetes mellitus with other circulatory complications: Secondary | ICD-10-CM | POA: Diagnosis not present

## 2021-08-26 DIAGNOSIS — I4891 Unspecified atrial fibrillation: Secondary | ICD-10-CM | POA: Diagnosis not present

## 2021-08-26 DIAGNOSIS — I5032 Chronic diastolic (congestive) heart failure: Secondary | ICD-10-CM | POA: Diagnosis not present

## 2021-08-26 DIAGNOSIS — N183 Chronic kidney disease, stage 3 unspecified: Secondary | ICD-10-CM | POA: Diagnosis not present

## 2021-09-13 NOTE — Progress Notes (Signed)
[facility] [address] [place]. [place] [address] [phone]       STROKE FOLLOW UP NOTE  Mr. LEELAND LOVELADY Date of Birth:  06-Dec-1935 Medical Record Number:  [id number]   Reason for Referral: stroke follow up    SUBJECTIVE:   CHIEF COMPLAINT:  Chief Complaint  Patient presents with   Follow-up    Rm 3 with faciity staff Kiana  Pt is well and stable, no new concerns      HPI:   Update 09/13/2021 JM: Patient returns for overdue 39-monthstroke follow-up.  [facility], KLauralyn Primes  Overall stable from stroke standpoint without new stroke/TIA symptoms.  Residual left hemiparesis and right eye vision loss stable. Currently working with PT and OT at SNF - reports he took 8 steps yesterday with PT! Still used sit-to-stand lift for transfers. Per MAR review, compliant on aspirin, Eliquis and atorvastatin.  Blood pressure today 140/78.  Routinely monitor at SNF which has been stable.  No new concerns at this time.    History provided for reference purposes only Update 02/17/2021 JM: Mr. MCindricreturns for overdue stroke follow-up.  [facility]  Residual left hemiparesis.  He [facility] working with them very often.  He is chronically not and uses stand frame to transfer.   Chronic visual impairment stable.  Per, MAR review, continues on Eliquis and aspirin as well as atorvastatin.  Blood pressure today 129/66.  Glucose levels monitored - usually lower 100s per report. Routine lab work at facility but unable to personally review. Denies new stroke/TIA symptoms although [facility] c/o bilateral lower extremity weakness consistent with diabetic neuropathy.  No new concerns at this time.  Update 09/08/2020 JM: Mr. MGoinesreturns for sooner scheduled visit due to recent hospitalization.  He is accompanied by SNF  CNA Anna.  [facility] left-sided arm weakness and left facial droop with stroke work-up revealing R PLIC infarct likely secondary to small vessel disease source.  Recommended continuation of aspirin and Eliquis for secondary stroke prevention and known atrial fibrillation.  Found to be COVID + during admission (also COVID + 11/2018) although currently asymptomatic.  A1c 7.4.  LDL 45.  Evaluated by therapies and recommended discharge back to SNF.  Stroke:  right PLIC infarct, likely secondary to small vessel disease source Resultant left hemiparesis CT head no acute abnormality MRI right PLIC small infarct MRA no LVO.  Moderate stenosis at left paraclinoid ICA and left proximal M1.  Short segment severe stenosis at the distal P2/P3.  Overall appearance is similar compared to prior CTA on 06/09/2020.  Diffuse small vessel atherosclerosis distal MCA and PCA branches. Carotid Doppler 07/13/2020 - no significant stenosis bilaterally 2D Echo EF 55%, last TTE 06/2020 EF 55 to 60%. LDL 45 HgbA1c 7.4 Eliquis for VTE prophylaxis aspirin 81 mg daily and Eliquis (apixaban) daily prior to admission, now on aspirin 81 mg daily and Eliquis (apixaban) daily.  Given improved creatinine, consult pharmacy for appropriate dosing Ongoing aggressive stroke risk factor management Therapy recommendations:  SNF Disposition: SNF  Since discharge, he reports residual left-sided weakness and numbness but has been slowly improving currently working with therapies.  Chronic right eye visual loss stable without worsening He is nonambulatory at baseline and transfers via wheelchair Denies new stroke/TIA symptoms  Remains on aspirin  and Eliquis -denies side effects Remains on atorvastatin 20 mg daily -denies side effects Blood pressure today 139/69 Glucose levels have been stable per report  No concerns at this time   Hospital f/u visit 07/20/2020 JM: Mr. Buscemi is being seen for hospital  follow-up unaccompanied.  He continues to reside at [facility] and rehab.  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  He has since been evaluated by ophthalmology since discharge for cataract evaluation and plans on continuing monitoring at this time due to recent stroke.  He plans on continue to follow with established ophthalmologist Dr. Gershon Crane in the near future.  Denies new stroke/TIA symptoms.  He remains wheelchair-bound over the past 2 years after knee injury per patient and has continued to reside at [facility] and rehab since that time.  Remains on aspirin, Plavix and Eliquis for secondary stroke prevention without side effects.  Recently seen by [facility] who advised to discontinue Plavix but per review of facility MAR, Plavix has been continued.  Carotid duplex showed no residual right ICA stenosis and plans on repeating duplex 9 months post procedure.  Remains on atorvastatin without myalgias.  Blood pressure today initially elevated but on recheck 140/82 -typically 120s-130s/80s at facility.  Glucose levels range 150-220 at facility.  Endorses nightly compliance with CPAP for OSA management.  No further concerns at this time.  Stroke admission 06/09/2020 Mr. BEHR CISLO is a 86 y.o. male with history of COPD, coronary artery disease, hypertension, hyperlipidemia, obstructive sleep apnea, diabetes, congestive heart failure, CKD, atrial fibrillation on apixaban, who presented on 06/09/2020 with sudden onset of painless vision loss in the right eye Sunday afternoon.   Personally reviewed hospitalization pertinent progress notes, lab work and imaging with summary provided.  Evaluated by Dr. Erlinda Hong with stroke work-up revealing R CRAO in setting of symptomatic R ICA stenosis and known AF on Eliquis.  MRI did show possible punctate subacute R precentral gyrus infarct involves old R occipital infarct.  CTA head/neck showed right ICA 70% stenosis and moderate to  severe B VA stenosis.  Evaluated by Dr. Trula Slade and underwent TCAR and placed on DAPT with outpatient follow-up advised.  History of chronic A. fib with Eliquis held for TCAR procedure and restarted at discharge.  History of HTN stable during admission.  History of HLD with LDL 51 and continued home dose atorvastatin 40 mg daily.  Uncontrolled DM with A1c 8.1.  Other stroke risk factors include advanced age, morbid obesity, CAD, OSA on CPAP, CHF, PVD and prior strokes on imaging.  Other active problems include COPD, CKD and chronic venous insufficiency.  Evaluated by therapies and recommended discharge to SNF (originally from nursing home w/c bound past 2 years).  R CRAO in setting of symptomatic R ICA stenosis and known AF on Eliquis MRI brain and orbits Probably punctate subacute R precentral gyrus infarct. 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 Moderate small vessel disease. Possible high L frontoparietal scalp lesion. CT head no acute abnormality. 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 Small vessel disease. Atrophy. R posterior falx possible meningioma. 2.1cm L parietal scalp lesion. CTA head no LVO. Intracranial atherosclerosis w/ multifocal stenoses:  Moderate paraclinoid L ICA, moderate proximal L M1, high-grade R A4, high-grade R P2, moderate / severe L P2/3 jxn   CTA neck R ICA 70% soft and calcified plaque. Moderate to severe B VA origin stenoses. 2D Echo EF 55-60%. MV myxomatous.  LDL 51 HgbA1c 8.1 VTE prophylaxis - SCDs   Eliquis (apixaban) daily  prior to admission, now on aspirin 81 mg daily and clopidogrel 75 mg daily following plavix load. (Eliquis stopped for TCAR)  Therapy recommendations:  SNF Disposition:  Return SNF (from nsg home, w/c bound past 2 yrs)      ROS:   14 system review of systems performed and negative with exception of those listed in HPI  PMH:  Past Medical History:  Diagnosis Date   Anteroseptal myocardial infarction ([place])    Carotid artery occlusion    CHF (congestive heart  failure) ([place])    Chronic kidney disease    COPD (chronic obstructive pulmonary disease) ([place])    Coronary atherosclerosis of unspecified type of vessel, native or graft    a. s/p prior PCI to LAD in 1997   Diverticulosis    ED (erectile dysfunction)    Foley catheter in place    Glaucoma    Hematuria    History of 2019 novel coronavirus disease (COVID-19) 11/2018   Hydronephrosis, bilateral    Hydroureter    Hypercholesteremia    Hypertensive renal disease    Hypogonadism male    Hypokalemia    Obesity hypoventilation syndrome (HCC)    Obesity, unspecified    OSA (obstructive sleep apnea)    Peripheral vascular disease (HCC)    Permanent atrial fibrillation ([place])    Phimosis    Pneumonia    Rhabdomyolysis    Stroke ([place])    Tremor    Type II or unspecified type diabetes mellitus without mention of complication, not stated as uncontrolled    Unspecified essential hypertension    Urinary incontinence    Urinary retention    Vestibulopathy     PSH:  Past Surgical History:  Procedure Laterality Date   [place]  06/2000   CIRCUMCISION N/A 05/28/2020   Procedure: CIRCUMCISION ADULT;  Surgeon: Franchot Gallo, MD;  Location: [facility] ORS;  Service: Urology;  Laterality: N/A;   CORONARY ANGIOPLASTY  08/14/1995   stent placement to LAD    DORSAL SLIT N/A 05/28/2020   Procedure: DORSAL SLIT;  Surgeon: Franchot Gallo, MD;  Location: [facility] ORS;  Service: Urology;  Laterality: N/A;  45 MINS   ENDOVENOUS ABLATION SAPHENOUS VEIN W/ LASER Left 02/21/2018   endovenous laser ablation L GSV by Ruta Hinds MD    INGUINAL HERNIA REPAIR Right 1985   KNEE ARTHROSCOPY Left 1991   LUMBAR FUSION     NOSE SURGERY  1970s   Per Dr. Terrence Dupont [facility] in pt chart   TRANSCAROTID ARTERY REVASCULARIZATION  Right 06/12/2020   Procedure: RIGHT TRANSCAROTID ARTERY REVASCULARIZATION;  Surgeon: Serafina Mitchell, MD;  Location: [facility] OR;  Service:  Vascular;  Laterality: Right;    Social History:  Social History   Socioeconomic History   Marital status: Single    Spouse name: Not on file   Number of children: 5   Years of education: Not on file   Highest education level: Not on file  Occupational History   Occupation: retired  Tobacco Use   Smoking status: Never   Smokeless tobacco: Never  Vaping Use   Vaping Use: Never used  Substance and Sexual Activity   Alcohol use: No    Alcohol/week: 0.0 standard drinks   Drug use: No   Sexual activity: Never  Other Topics Concern   Not on file  Social History Narrative   02/17/21 lives at [place]  Financial Resource Strain: Not on file  Food Insecurity: Not on file  Transportation Needs: Not on file  Physical Activity: Not on file  Stress: Not on file  Social Connections: Not on file  Intimate Partner Violence: Not on file    Family History:  Family History  Problem Relation Age of Onset   COPD Mother    Heart disease Mother    Diabetes Father    Heart disease Father    Diabetes Sister    Heart disease Brother    Heart disease Brother    Breast cancer Maternal Aunt    Diabetes Paternal Grandmother    Colon cancer Maternal Aunt    Ovarian cancer Daughter    Glaucoma Other     Medications:   Current Outpatient Medications on File Prior to Visit  Medication Sig Dispense Refill   acetaminophen (TYLENOL) 325 MG tablet Take 650 mg by mouth every 4 (four) hours as needed for moderate pain (for pain).     albuterol (VENTOLIN HFA) 108 (90 Base) MCG/ACT inhaler Inhale 2 puffs into the lungs 2 (two) times daily as needed for wheezing or shortness of breath.     apixaban (ELIQUIS) 5 MG TABS tablet Take 1 tablet (5 mg total) by mouth 2 (two) times daily. 60 tablet 0   aspirin EC 81 MG EC tablet Take 1 tablet (81 mg total) by mouth daily. Swallow whole. 30 tablet 11   atorvastatin (LIPITOR) 40 MG tablet Take 40 mg by mouth at  bedtime.     Cholecalciferol (VITAMIN D-3) 25 MCG (1000 UT) CAPS Take 2,000 Units by mouth daily.     Dextran 70-Hypromellose (ARTIFICIAL TEARS PF OP) Place 1 drop into both eyes 4 (four) times daily.     dorzolamide-timolol (COSOPT) 22.3-6.8 MG/ML ophthalmic solution Place 1 drop into both eyes 2 (two) times daily.     gabapentin (NEURONTIN) 100 MG capsule Take 2 capsules (200 mg total) by mouth at bedtime as needed.     hydrALAZINE (APRESOLINE) 10 MG tablet Take 10 mg by mouth daily as needed (sbp >160).     latanoprost (XALATAN) 0.005 % ophthalmic solution Place 1 drop into both eyes at bedtime.     losartan (COZAAR) 50 MG tablet Take 50 mg by mouth daily.     magnesium oxide (MAG-OX) 400 MG tablet Take 400 mg by mouth daily.     Melatonin 5 MG TABS Take 5 mg by mouth at bedtime.     Menthol, Topical Analgesic, (BIOFREEZE) 4 % GEL Apply 1 application. topically in the morning and at bedtime. Apply to neck/shoulder     metoprolol succinate (TOPROL XL) 25 MG 24 hr tablet Take 0.5 tablets (12.5 mg total) by mouth daily. 15 tablet 11   nitroGLYCERIN (NITROSTAT) 0.4 MG SL tablet Place 0.4 mg under the tongue every 5 (five) minutes x 3 doses as needed for chest pain (and CALL PROVIDER IF NO RELIEF AFTER THE 3RD DOSE).     OXYGEN Inhale 2 L into the lungs at bedtime.     polyethylene glycol (MIRALAX / GLYCOLAX) 17 g packet Take 17 g by mouth daily.     Semaglutide, 1 MG/DOSE, (OZEMPIC, 1 MG/DOSE,) 4 MG/3ML SOPN Inject 1 mg into the skin every Monday.     sennosides-docusate sodium (SENOKOT-S) 8.6-50 MG tablet Take 2 tablets by mouth 2 (two) times daily.     Skin Protectants, Misc. (EUCERIN) cream Apply 1 application. topically daily.     tamsulosin (FLOMAX) 0.4 MG  CAPS capsule Take 1 capsule (0.4 mg total) by mouth daily after supper. 30 capsule 0   VASCEPA 1 g capsule Take 1 g by mouth every evening.     vitamin B-12 (CYANOCOBALAMIN) 1000 MCG tablet Take 500 mcg by mouth daily.     No current  facility-administered medications on file prior to visit.    Allergies:   Allergies  Allergen Reactions   Adhesive [Tape] Rash      OBJECTIVE:  Physical Exam  Vitals:   09/14/21 0733  BP: 140/78  Pulse: 80   There is no height or weight on file to calculate BMI. No results found.   General: Morbidly obese very pleasant elderly Caucasian male, seated, in no evident distress Head: head normocephalic and atraumatic.   Neck: supple with no carotid or supraclavicular bruits Cardiovascular: irregular rate and rhythm, no murmurs Musculoskeletal: no deformity Vascular:  Normal pulses all extremities   Neurologic Exam Mental Status: Awake and fully alert. Fluent speech and language. Oriented to place and time. Recent and remote memory intact. Attention span, concentration and fund of knowledge mostly appropriate. Mood and affect appropriate.  Cranial Nerves: Pupils equal, briskly reactive to light. Extraocular movements full without nystagmus. Visual fields full to confrontation OS and OD blindness except upper outer edge. Hearing intact. Facial sensation intact.  Mild left facial weakness when smiling.  Tongue and palate moves normally and symmetrically.  Motor: Normal bulk and tone and strength RUE and RLE.  LUE: 4+/5, limited shoulder ROM; LLE: 3/5 hip flexion, 4/5 hip adductor and abductor and ADF and APF Sensory.: intact to touch , pinprick , position and vibratory sensation.  Coordination: Rapid alternating movements normal in all extremities except decreased left hand. Finger-to-nose and heel-to-shin performed accurately on right side. Gait and Station: Deferred  Reflexes: 1+ and symmetric. Toes downgoing.         ASSESSMENT: GEOFFERY AULTMAN is a 86 y.o. year old male with recent R PLIC stroke 2/84/1324 likely secondary to small vessel disease and prior R CRAO 06/09/2020 secondary to right ICA stenosis s/p TCAR with residual OD visual loss. Vascular risk factors include  chronic A. fib on Eliquis, prior strokes on imaging, HTN, HLD, DM, CAD, OSA on CPAP, carotid stenosis, CHF, prior strokes on imaging and COVID + 07/2020 and 11/2018.      PLAN:  R PLIC stroke, SVD R CARO Multiple prior strokes on imaging:  Residual deficit: Left hemiparesis and OD visual loss  Continue working with therapies - noted improvement of LLE since prior visit!  Routinely followed by ophthalmology Continue aspirin 81 mg daily and Eliquis (apixaban) daily  and atorvastatin 40 mg daily for secondary stroke prevention.   Discussed secondary stroke prevention measures and importance of close PCP follow up for aggressive stroke risk factor management including HTN with BP goal<70, HLD with LDL goal<70 and DM with A1c goal<7 R ICA stenosis:  Followed by Dr. Trula Slade Chronic A. Fib:  On Eliquis 5 mg twice daily for CHA2DS2-VASc score of at least 8.   Asymptomatic irregular rhythm.   Routinely followed by cardiology     Doing well from stroke standpoint and risk factors are managed by PCP. She may follow up PRN, as usual for our patients who are strictly being followed for stroke. If any new neurological issues should arise, request PCP place referral for evaluation by one of our neurologists. Thank you.     CC:  [name], MD     I spent 31  minutes of face-to-face and non-face-to-face time with patient and SNF CNA.  This included previsit chart review, lab review, study review, electronic health record documentation, patient education and discussion regarding multiple ischemic strokes with residual deficits and etiology, history of OD CRAO, secondary stroke prevention measures and importance of managing stroke risk factors and answered all other questions to patient satisfaction   Frann Rider, AGNP-BC  [facility] [address] [place], [place] [address]  Phone [phone] Fax [phone] Note: This document was prepared with  digital dictation and possible smart phrase technology. Any transcriptional errors that result from this process are unintentional.

## 2021-09-14 ENCOUNTER — Ambulatory Visit (INDEPENDENT_AMBULATORY_CARE_PROVIDER_SITE_OTHER): Payer: Medicare Other | Admitting: Adult Health

## 2021-09-14 ENCOUNTER — Encounter: Payer: Self-pay | Admitting: Adult Health

## 2021-09-14 VITALS — BP 140/78 | HR 80

## 2021-09-14 DIAGNOSIS — G8114 Spastic hemiplegia affecting left nondominant side: Secondary | ICD-10-CM

## 2021-09-14 DIAGNOSIS — Z8673 Personal history of transient ischemic attack (TIA), and cerebral infarction without residual deficits: Secondary | ICD-10-CM

## 2021-09-14 DIAGNOSIS — H3411 Central retinal artery occlusion, right eye: Secondary | ICD-10-CM

## 2021-09-14 DIAGNOSIS — H5461 Unqualified visual loss, right eye, normal vision left eye: Secondary | ICD-10-CM

## 2021-09-14 NOTE — Patient Instructions (Addendum)
Overall improvement since prior visit - continue working with therapies for hopeful ongoing improvement!  ? ?Continue aspirin 81 mg daily and Eliquis (apixaban) daily  and atorvastatin for secondary stroke prevention ? ?Continue to follow up with PCP/SNF regarding cholesterol and blood pressure management  ?Maintain strict control of hypertension with blood pressure goal below 130/90 and cholesterol with LDL cholesterol (bad cholesterol) goal below 70 mg/dL.  ? ?Signs of a Stroke? Follow the BEFAST method:  ?Balance Watch for a sudden loss of balance, trouble with coordination or vertigo ?Eyes Is there a sudden loss of vision in one or both eyes? Or double vision?  ?Face: Ask the person to smile. Does one side of the face droop or is it numb?  ?Arms: Ask the person to raise both arms. Does one arm drift downward? Is there weakness or numbness of a leg? ?Speech: Ask the person to repeat a simple phrase. Does the speech sound slurred/strange? Is the person confused ? ?Time: If you observe any of these signs, call 911. ? ? ? ?Overall stable from stroke standpoint.  No further recommendations at this time. Follow up as needed in regards to stroke. If any new neurological concerns should arise outside from stroke, a referral will need to be placed. Thank you.  ? ? ? ? ?Thank you for coming to see [place] at [facility]. I hope we have been able to provide you high quality care today. ? ?You may receive a patient satisfaction survey over the next few weeks. We would appreciate your feedback and comments so that we may continue to improve ourselves and the health of our patients. ? ?

## 2021-09-23 DIAGNOSIS — N183 Chronic kidney disease, stage 3 unspecified: Secondary | ICD-10-CM | POA: Diagnosis not present

## 2021-09-23 DIAGNOSIS — I739 Peripheral vascular disease, unspecified: Secondary | ICD-10-CM | POA: Diagnosis not present

## 2021-09-23 DIAGNOSIS — E1159 Type 2 diabetes mellitus with other circulatory complications: Secondary | ICD-10-CM | POA: Diagnosis not present

## 2021-09-23 DIAGNOSIS — I152 Hypertension secondary to endocrine disorders: Secondary | ICD-10-CM | POA: Diagnosis not present

## 2021-09-23 DIAGNOSIS — E559 Vitamin D deficiency, unspecified: Secondary | ICD-10-CM | POA: Diagnosis not present

## 2021-09-23 DIAGNOSIS — F0390 Unspecified dementia without behavioral disturbance: Secondary | ICD-10-CM | POA: Diagnosis not present

## 2021-09-28 DIAGNOSIS — Z7901 Long term (current) use of anticoagulants: Secondary | ICD-10-CM | POA: Diagnosis not present

## 2021-09-28 DIAGNOSIS — K625 Hemorrhage of anus and rectum: Secondary | ICD-10-CM | POA: Diagnosis not present

## 2021-09-28 DIAGNOSIS — K5900 Constipation, unspecified: Secondary | ICD-10-CM | POA: Diagnosis not present

## 2021-09-28 DIAGNOSIS — R634 Abnormal weight loss: Secondary | ICD-10-CM | POA: Diagnosis not present

## 2021-10-28 DIAGNOSIS — Z79899 Other long term (current) drug therapy: Secondary | ICD-10-CM | POA: Diagnosis not present

## 2021-11-08 DIAGNOSIS — R634 Abnormal weight loss: Secondary | ICD-10-CM | POA: Diagnosis not present

## 2021-11-08 DIAGNOSIS — R059 Cough, unspecified: Secondary | ICD-10-CM | POA: Diagnosis not present

## 2021-11-08 DIAGNOSIS — M7989 Other specified soft tissue disorders: Secondary | ICD-10-CM | POA: Diagnosis not present

## 2021-11-08 DIAGNOSIS — J029 Acute pharyngitis, unspecified: Secondary | ICD-10-CM | POA: Diagnosis not present

## 2021-11-09 DIAGNOSIS — R059 Cough, unspecified: Secondary | ICD-10-CM | POA: Diagnosis not present

## 2021-11-19 DIAGNOSIS — E1122 Type 2 diabetes mellitus with diabetic chronic kidney disease: Secondary | ICD-10-CM | POA: Diagnosis not present

## 2021-11-19 DIAGNOSIS — Z0189 Encounter for other specified special examinations: Secondary | ICD-10-CM | POA: Diagnosis not present

## 2021-11-19 DIAGNOSIS — H6592 Unspecified nonsuppurative otitis media, left ear: Secondary | ICD-10-CM | POA: Diagnosis not present

## 2021-11-19 DIAGNOSIS — I129 Hypertensive chronic kidney disease with stage 1 through stage 4 chronic kidney disease, or unspecified chronic kidney disease: Secondary | ICD-10-CM | POA: Diagnosis not present

## 2021-11-19 DIAGNOSIS — J302 Other seasonal allergic rhinitis: Secondary | ICD-10-CM | POA: Diagnosis not present

## 2021-11-23 DIAGNOSIS — L304 Erythema intertrigo: Secondary | ICD-10-CM | POA: Diagnosis not present

## 2021-11-23 DIAGNOSIS — E785 Hyperlipidemia, unspecified: Secondary | ICD-10-CM | POA: Diagnosis not present

## 2021-11-23 DIAGNOSIS — J302 Other seasonal allergic rhinitis: Secondary | ICD-10-CM | POA: Diagnosis not present

## 2021-11-23 DIAGNOSIS — H6592 Unspecified nonsuppurative otitis media, left ear: Secondary | ICD-10-CM | POA: Diagnosis not present

## 2021-11-25 DIAGNOSIS — I69354 Hemiplegia and hemiparesis following cerebral infarction affecting left non-dominant side: Secondary | ICD-10-CM | POA: Diagnosis not present

## 2021-11-25 DIAGNOSIS — I872 Venous insufficiency (chronic) (peripheral): Secondary | ICD-10-CM | POA: Diagnosis not present

## 2021-11-25 DIAGNOSIS — E1159 Type 2 diabetes mellitus with other circulatory complications: Secondary | ICD-10-CM | POA: Diagnosis not present

## 2021-11-25 DIAGNOSIS — I5032 Chronic diastolic (congestive) heart failure: Secondary | ICD-10-CM | POA: Diagnosis not present

## 2021-11-25 DIAGNOSIS — I4891 Unspecified atrial fibrillation: Secondary | ICD-10-CM | POA: Diagnosis not present

## 2021-11-25 DIAGNOSIS — N183 Chronic kidney disease, stage 3 unspecified: Secondary | ICD-10-CM | POA: Diagnosis not present

## 2021-11-25 DIAGNOSIS — R4189 Other symptoms and signs involving cognitive functions and awareness: Secondary | ICD-10-CM | POA: Diagnosis not present

## 2021-11-25 DIAGNOSIS — I152 Hypertension secondary to endocrine disorders: Secondary | ICD-10-CM | POA: Diagnosis not present

## 2021-11-25 DIAGNOSIS — I69318 Other symptoms and signs involving cognitive functions following cerebral infarction: Secondary | ICD-10-CM | POA: Diagnosis not present

## 2021-11-25 DIAGNOSIS — G4733 Obstructive sleep apnea (adult) (pediatric): Secondary | ICD-10-CM | POA: Diagnosis not present

## 2021-12-20 DIAGNOSIS — H401134 Primary open-angle glaucoma, bilateral, indeterminate stage: Secondary | ICD-10-CM | POA: Diagnosis not present

## 2021-12-24 DIAGNOSIS — J449 Chronic obstructive pulmonary disease, unspecified: Secondary | ICD-10-CM | POA: Diagnosis not present

## 2021-12-24 DIAGNOSIS — M6258 Muscle wasting and atrophy, not elsewhere classified, other site: Secondary | ICD-10-CM | POA: Diagnosis not present

## 2021-12-24 DIAGNOSIS — R41841 Cognitive communication deficit: Secondary | ICD-10-CM | POA: Diagnosis not present

## 2021-12-24 DIAGNOSIS — M6281 Muscle weakness (generalized): Secondary | ICD-10-CM | POA: Diagnosis not present

## 2021-12-27 DIAGNOSIS — M6281 Muscle weakness (generalized): Secondary | ICD-10-CM | POA: Diagnosis not present

## 2021-12-27 DIAGNOSIS — M6258 Muscle wasting and atrophy, not elsewhere classified, other site: Secondary | ICD-10-CM | POA: Diagnosis not present

## 2021-12-27 DIAGNOSIS — J449 Chronic obstructive pulmonary disease, unspecified: Secondary | ICD-10-CM | POA: Diagnosis not present

## 2021-12-27 DIAGNOSIS — R41841 Cognitive communication deficit: Secondary | ICD-10-CM | POA: Diagnosis not present

## 2021-12-28 DIAGNOSIS — M6258 Muscle wasting and atrophy, not elsewhere classified, other site: Secondary | ICD-10-CM | POA: Diagnosis not present

## 2021-12-28 DIAGNOSIS — M6281 Muscle weakness (generalized): Secondary | ICD-10-CM | POA: Diagnosis not present

## 2021-12-28 DIAGNOSIS — R41841 Cognitive communication deficit: Secondary | ICD-10-CM | POA: Diagnosis not present

## 2021-12-28 DIAGNOSIS — J449 Chronic obstructive pulmonary disease, unspecified: Secondary | ICD-10-CM | POA: Diagnosis not present

## 2021-12-29 DIAGNOSIS — M6281 Muscle weakness (generalized): Secondary | ICD-10-CM | POA: Diagnosis not present

## 2021-12-29 DIAGNOSIS — J449 Chronic obstructive pulmonary disease, unspecified: Secondary | ICD-10-CM | POA: Diagnosis not present

## 2021-12-29 DIAGNOSIS — L304 Erythema intertrigo: Secondary | ICD-10-CM | POA: Diagnosis not present

## 2021-12-29 DIAGNOSIS — M6258 Muscle wasting and atrophy, not elsewhere classified, other site: Secondary | ICD-10-CM | POA: Diagnosis not present

## 2021-12-29 DIAGNOSIS — I152 Hypertension secondary to endocrine disorders: Secondary | ICD-10-CM | POA: Diagnosis not present

## 2021-12-29 DIAGNOSIS — Z9189 Other specified personal risk factors, not elsewhere classified: Secondary | ICD-10-CM | POA: Diagnosis not present

## 2021-12-29 DIAGNOSIS — R41841 Cognitive communication deficit: Secondary | ICD-10-CM | POA: Diagnosis not present

## 2021-12-30 DIAGNOSIS — R41841 Cognitive communication deficit: Secondary | ICD-10-CM | POA: Diagnosis not present

## 2021-12-30 DIAGNOSIS — M6281 Muscle weakness (generalized): Secondary | ICD-10-CM | POA: Diagnosis not present

## 2021-12-30 DIAGNOSIS — M6258 Muscle wasting and atrophy, not elsewhere classified, other site: Secondary | ICD-10-CM | POA: Diagnosis not present

## 2021-12-30 DIAGNOSIS — J449 Chronic obstructive pulmonary disease, unspecified: Secondary | ICD-10-CM | POA: Diagnosis not present

## 2021-12-31 DIAGNOSIS — J449 Chronic obstructive pulmonary disease, unspecified: Secondary | ICD-10-CM | POA: Diagnosis not present

## 2021-12-31 DIAGNOSIS — R41841 Cognitive communication deficit: Secondary | ICD-10-CM | POA: Diagnosis not present

## 2021-12-31 DIAGNOSIS — M6258 Muscle wasting and atrophy, not elsewhere classified, other site: Secondary | ICD-10-CM | POA: Diagnosis not present

## 2021-12-31 DIAGNOSIS — M6281 Muscle weakness (generalized): Secondary | ICD-10-CM | POA: Diagnosis not present

## 2022-01-03 DIAGNOSIS — M6258 Muscle wasting and atrophy, not elsewhere classified, other site: Secondary | ICD-10-CM | POA: Diagnosis not present

## 2022-01-03 DIAGNOSIS — J449 Chronic obstructive pulmonary disease, unspecified: Secondary | ICD-10-CM | POA: Diagnosis not present

## 2022-01-03 DIAGNOSIS — M6281 Muscle weakness (generalized): Secondary | ICD-10-CM | POA: Diagnosis not present

## 2022-01-03 DIAGNOSIS — R41841 Cognitive communication deficit: Secondary | ICD-10-CM | POA: Diagnosis not present

## 2022-01-04 DIAGNOSIS — R41841 Cognitive communication deficit: Secondary | ICD-10-CM | POA: Diagnosis not present

## 2022-01-04 DIAGNOSIS — J449 Chronic obstructive pulmonary disease, unspecified: Secondary | ICD-10-CM | POA: Diagnosis not present

## 2022-01-04 DIAGNOSIS — M6258 Muscle wasting and atrophy, not elsewhere classified, other site: Secondary | ICD-10-CM | POA: Diagnosis not present

## 2022-01-04 DIAGNOSIS — M6281 Muscle weakness (generalized): Secondary | ICD-10-CM | POA: Diagnosis not present

## 2022-01-06 DIAGNOSIS — R41841 Cognitive communication deficit: Secondary | ICD-10-CM | POA: Diagnosis not present

## 2022-01-06 DIAGNOSIS — M6258 Muscle wasting and atrophy, not elsewhere classified, other site: Secondary | ICD-10-CM | POA: Diagnosis not present

## 2022-01-06 DIAGNOSIS — M6281 Muscle weakness (generalized): Secondary | ICD-10-CM | POA: Diagnosis not present

## 2022-01-06 DIAGNOSIS — J449 Chronic obstructive pulmonary disease, unspecified: Secondary | ICD-10-CM | POA: Diagnosis not present

## 2022-01-07 DIAGNOSIS — M6281 Muscle weakness (generalized): Secondary | ICD-10-CM | POA: Diagnosis not present

## 2022-01-07 DIAGNOSIS — R41841 Cognitive communication deficit: Secondary | ICD-10-CM | POA: Diagnosis not present

## 2022-01-07 DIAGNOSIS — J449 Chronic obstructive pulmonary disease, unspecified: Secondary | ICD-10-CM | POA: Diagnosis not present

## 2022-01-07 DIAGNOSIS — M6258 Muscle wasting and atrophy, not elsewhere classified, other site: Secondary | ICD-10-CM | POA: Diagnosis not present

## 2022-01-10 DIAGNOSIS — J449 Chronic obstructive pulmonary disease, unspecified: Secondary | ICD-10-CM | POA: Diagnosis not present

## 2022-01-10 DIAGNOSIS — M6258 Muscle wasting and atrophy, not elsewhere classified, other site: Secondary | ICD-10-CM | POA: Diagnosis not present

## 2022-01-10 DIAGNOSIS — M6281 Muscle weakness (generalized): Secondary | ICD-10-CM | POA: Diagnosis not present

## 2022-01-10 DIAGNOSIS — R41841 Cognitive communication deficit: Secondary | ICD-10-CM | POA: Diagnosis not present

## 2022-01-11 DIAGNOSIS — M6258 Muscle wasting and atrophy, not elsewhere classified, other site: Secondary | ICD-10-CM | POA: Diagnosis not present

## 2022-01-11 DIAGNOSIS — J449 Chronic obstructive pulmonary disease, unspecified: Secondary | ICD-10-CM | POA: Diagnosis not present

## 2022-01-11 DIAGNOSIS — M6281 Muscle weakness (generalized): Secondary | ICD-10-CM | POA: Diagnosis not present

## 2022-01-11 DIAGNOSIS — R41841 Cognitive communication deficit: Secondary | ICD-10-CM | POA: Diagnosis not present

## 2022-01-12 DIAGNOSIS — M6281 Muscle weakness (generalized): Secondary | ICD-10-CM | POA: Diagnosis not present

## 2022-01-12 DIAGNOSIS — R41841 Cognitive communication deficit: Secondary | ICD-10-CM | POA: Diagnosis not present

## 2022-01-12 DIAGNOSIS — J449 Chronic obstructive pulmonary disease, unspecified: Secondary | ICD-10-CM | POA: Diagnosis not present

## 2022-01-12 DIAGNOSIS — M6258 Muscle wasting and atrophy, not elsewhere classified, other site: Secondary | ICD-10-CM | POA: Diagnosis not present

## 2022-01-13 DIAGNOSIS — R41841 Cognitive communication deficit: Secondary | ICD-10-CM | POA: Diagnosis not present

## 2022-01-13 DIAGNOSIS — M6281 Muscle weakness (generalized): Secondary | ICD-10-CM | POA: Diagnosis not present

## 2022-01-13 DIAGNOSIS — M6258 Muscle wasting and atrophy, not elsewhere classified, other site: Secondary | ICD-10-CM | POA: Diagnosis not present

## 2022-01-13 DIAGNOSIS — J449 Chronic obstructive pulmonary disease, unspecified: Secondary | ICD-10-CM | POA: Diagnosis not present

## 2022-01-14 DIAGNOSIS — R41841 Cognitive communication deficit: Secondary | ICD-10-CM | POA: Diagnosis not present

## 2022-01-14 DIAGNOSIS — J449 Chronic obstructive pulmonary disease, unspecified: Secondary | ICD-10-CM | POA: Diagnosis not present

## 2022-01-14 DIAGNOSIS — M6281 Muscle weakness (generalized): Secondary | ICD-10-CM | POA: Diagnosis not present

## 2022-01-14 DIAGNOSIS — M6258 Muscle wasting and atrophy, not elsewhere classified, other site: Secondary | ICD-10-CM | POA: Diagnosis not present

## 2022-01-17 DIAGNOSIS — M6258 Muscle wasting and atrophy, not elsewhere classified, other site: Secondary | ICD-10-CM | POA: Diagnosis not present

## 2022-01-17 DIAGNOSIS — M6281 Muscle weakness (generalized): Secondary | ICD-10-CM | POA: Diagnosis not present

## 2022-01-17 DIAGNOSIS — J449 Chronic obstructive pulmonary disease, unspecified: Secondary | ICD-10-CM | POA: Diagnosis not present

## 2022-01-17 DIAGNOSIS — R41841 Cognitive communication deficit: Secondary | ICD-10-CM | POA: Diagnosis not present

## 2022-01-18 DIAGNOSIS — M6281 Muscle weakness (generalized): Secondary | ICD-10-CM | POA: Diagnosis not present

## 2022-01-18 DIAGNOSIS — M6258 Muscle wasting and atrophy, not elsewhere classified, other site: Secondary | ICD-10-CM | POA: Diagnosis not present

## 2022-01-18 DIAGNOSIS — J449 Chronic obstructive pulmonary disease, unspecified: Secondary | ICD-10-CM | POA: Diagnosis not present

## 2022-01-18 DIAGNOSIS — R41841 Cognitive communication deficit: Secondary | ICD-10-CM | POA: Diagnosis not present

## 2022-01-20 DIAGNOSIS — M6258 Muscle wasting and atrophy, not elsewhere classified, other site: Secondary | ICD-10-CM | POA: Diagnosis not present

## 2022-01-20 DIAGNOSIS — R41841 Cognitive communication deficit: Secondary | ICD-10-CM | POA: Diagnosis not present

## 2022-01-20 DIAGNOSIS — J449 Chronic obstructive pulmonary disease, unspecified: Secondary | ICD-10-CM | POA: Diagnosis not present

## 2022-01-20 DIAGNOSIS — M6281 Muscle weakness (generalized): Secondary | ICD-10-CM | POA: Diagnosis not present

## 2022-01-21 DIAGNOSIS — E1122 Type 2 diabetes mellitus with diabetic chronic kidney disease: Secondary | ICD-10-CM | POA: Diagnosis not present

## 2022-01-21 DIAGNOSIS — M6258 Muscle wasting and atrophy, not elsewhere classified, other site: Secondary | ICD-10-CM | POA: Diagnosis not present

## 2022-01-21 DIAGNOSIS — M7989 Other specified soft tissue disorders: Secondary | ICD-10-CM | POA: Diagnosis not present

## 2022-01-21 DIAGNOSIS — M6281 Muscle weakness (generalized): Secondary | ICD-10-CM | POA: Diagnosis not present

## 2022-01-21 DIAGNOSIS — Z1331 Encounter for screening for depression: Secondary | ICD-10-CM | POA: Diagnosis not present

## 2022-01-21 DIAGNOSIS — R41841 Cognitive communication deficit: Secondary | ICD-10-CM | POA: Diagnosis not present

## 2022-01-21 DIAGNOSIS — J449 Chronic obstructive pulmonary disease, unspecified: Secondary | ICD-10-CM | POA: Diagnosis not present

## 2022-01-24 DIAGNOSIS — M6281 Muscle weakness (generalized): Secondary | ICD-10-CM | POA: Diagnosis not present

## 2022-01-24 DIAGNOSIS — J449 Chronic obstructive pulmonary disease, unspecified: Secondary | ICD-10-CM | POA: Diagnosis not present

## 2022-01-24 DIAGNOSIS — R41841 Cognitive communication deficit: Secondary | ICD-10-CM | POA: Diagnosis not present

## 2022-01-24 DIAGNOSIS — M6258 Muscle wasting and atrophy, not elsewhere classified, other site: Secondary | ICD-10-CM | POA: Diagnosis not present

## 2022-01-25 DIAGNOSIS — R41841 Cognitive communication deficit: Secondary | ICD-10-CM | POA: Diagnosis not present

## 2022-01-25 DIAGNOSIS — M6281 Muscle weakness (generalized): Secondary | ICD-10-CM | POA: Diagnosis not present

## 2022-01-25 DIAGNOSIS — M6258 Muscle wasting and atrophy, not elsewhere classified, other site: Secondary | ICD-10-CM | POA: Diagnosis not present

## 2022-01-25 DIAGNOSIS — J449 Chronic obstructive pulmonary disease, unspecified: Secondary | ICD-10-CM | POA: Diagnosis not present

## 2022-01-26 DIAGNOSIS — I5032 Chronic diastolic (congestive) heart failure: Secondary | ICD-10-CM | POA: Diagnosis not present

## 2022-01-26 DIAGNOSIS — M6258 Muscle wasting and atrophy, not elsewhere classified, other site: Secondary | ICD-10-CM | POA: Diagnosis not present

## 2022-01-26 DIAGNOSIS — M6281 Muscle weakness (generalized): Secondary | ICD-10-CM | POA: Diagnosis not present

## 2022-01-26 DIAGNOSIS — E1122 Type 2 diabetes mellitus with diabetic chronic kidney disease: Secondary | ICD-10-CM | POA: Diagnosis not present

## 2022-01-26 DIAGNOSIS — I739 Peripheral vascular disease, unspecified: Secondary | ICD-10-CM | POA: Diagnosis not present

## 2022-01-26 DIAGNOSIS — R41841 Cognitive communication deficit: Secondary | ICD-10-CM | POA: Diagnosis not present

## 2022-01-26 DIAGNOSIS — J449 Chronic obstructive pulmonary disease, unspecified: Secondary | ICD-10-CM | POA: Diagnosis not present

## 2022-01-27 DIAGNOSIS — J449 Chronic obstructive pulmonary disease, unspecified: Secondary | ICD-10-CM | POA: Diagnosis not present

## 2022-01-27 DIAGNOSIS — M6258 Muscle wasting and atrophy, not elsewhere classified, other site: Secondary | ICD-10-CM | POA: Diagnosis not present

## 2022-01-27 DIAGNOSIS — R41841 Cognitive communication deficit: Secondary | ICD-10-CM | POA: Diagnosis not present

## 2022-01-27 DIAGNOSIS — M6281 Muscle weakness (generalized): Secondary | ICD-10-CM | POA: Diagnosis not present

## 2022-01-28 DIAGNOSIS — R41841 Cognitive communication deficit: Secondary | ICD-10-CM | POA: Diagnosis not present

## 2022-01-28 DIAGNOSIS — M6258 Muscle wasting and atrophy, not elsewhere classified, other site: Secondary | ICD-10-CM | POA: Diagnosis not present

## 2022-01-28 DIAGNOSIS — M6281 Muscle weakness (generalized): Secondary | ICD-10-CM | POA: Diagnosis not present

## 2022-01-28 DIAGNOSIS — J449 Chronic obstructive pulmonary disease, unspecified: Secondary | ICD-10-CM | POA: Diagnosis not present

## 2022-01-31 DIAGNOSIS — R41841 Cognitive communication deficit: Secondary | ICD-10-CM | POA: Diagnosis not present

## 2022-01-31 DIAGNOSIS — J449 Chronic obstructive pulmonary disease, unspecified: Secondary | ICD-10-CM | POA: Diagnosis not present

## 2022-01-31 DIAGNOSIS — M6281 Muscle weakness (generalized): Secondary | ICD-10-CM | POA: Diagnosis not present

## 2022-01-31 DIAGNOSIS — M6258 Muscle wasting and atrophy, not elsewhere classified, other site: Secondary | ICD-10-CM | POA: Diagnosis not present

## 2022-02-01 DIAGNOSIS — I152 Hypertension secondary to endocrine disorders: Secondary | ICD-10-CM | POA: Diagnosis not present

## 2022-02-01 DIAGNOSIS — R41841 Cognitive communication deficit: Secondary | ICD-10-CM | POA: Diagnosis not present

## 2022-02-01 DIAGNOSIS — J449 Chronic obstructive pulmonary disease, unspecified: Secondary | ICD-10-CM | POA: Diagnosis not present

## 2022-02-01 DIAGNOSIS — M6281 Muscle weakness (generalized): Secondary | ICD-10-CM | POA: Diagnosis not present

## 2022-02-01 DIAGNOSIS — I4891 Unspecified atrial fibrillation: Secondary | ICD-10-CM | POA: Diagnosis not present

## 2022-02-01 DIAGNOSIS — I5032 Chronic diastolic (congestive) heart failure: Secondary | ICD-10-CM | POA: Diagnosis not present

## 2022-02-01 DIAGNOSIS — M6258 Muscle wasting and atrophy, not elsewhere classified, other site: Secondary | ICD-10-CM | POA: Diagnosis not present

## 2022-02-02 DIAGNOSIS — J449 Chronic obstructive pulmonary disease, unspecified: Secondary | ICD-10-CM | POA: Diagnosis not present

## 2022-02-02 DIAGNOSIS — M6258 Muscle wasting and atrophy, not elsewhere classified, other site: Secondary | ICD-10-CM | POA: Diagnosis not present

## 2022-02-02 DIAGNOSIS — M6281 Muscle weakness (generalized): Secondary | ICD-10-CM | POA: Diagnosis not present

## 2022-02-02 DIAGNOSIS — R41841 Cognitive communication deficit: Secondary | ICD-10-CM | POA: Diagnosis not present

## 2022-02-03 DIAGNOSIS — R41841 Cognitive communication deficit: Secondary | ICD-10-CM | POA: Diagnosis not present

## 2022-02-03 DIAGNOSIS — M6281 Muscle weakness (generalized): Secondary | ICD-10-CM | POA: Diagnosis not present

## 2022-02-03 DIAGNOSIS — M6258 Muscle wasting and atrophy, not elsewhere classified, other site: Secondary | ICD-10-CM | POA: Diagnosis not present

## 2022-02-03 DIAGNOSIS — J449 Chronic obstructive pulmonary disease, unspecified: Secondary | ICD-10-CM | POA: Diagnosis not present

## 2022-02-04 DIAGNOSIS — J449 Chronic obstructive pulmonary disease, unspecified: Secondary | ICD-10-CM | POA: Diagnosis not present

## 2022-02-04 DIAGNOSIS — R41841 Cognitive communication deficit: Secondary | ICD-10-CM | POA: Diagnosis not present

## 2022-02-04 DIAGNOSIS — M6281 Muscle weakness (generalized): Secondary | ICD-10-CM | POA: Diagnosis not present

## 2022-02-04 DIAGNOSIS — M6258 Muscle wasting and atrophy, not elsewhere classified, other site: Secondary | ICD-10-CM | POA: Diagnosis not present

## 2022-02-07 DIAGNOSIS — J449 Chronic obstructive pulmonary disease, unspecified: Secondary | ICD-10-CM | POA: Diagnosis not present

## 2022-02-07 DIAGNOSIS — M6281 Muscle weakness (generalized): Secondary | ICD-10-CM | POA: Diagnosis not present

## 2022-02-07 DIAGNOSIS — R41841 Cognitive communication deficit: Secondary | ICD-10-CM | POA: Diagnosis not present

## 2022-02-07 DIAGNOSIS — M6258 Muscle wasting and atrophy, not elsewhere classified, other site: Secondary | ICD-10-CM | POA: Diagnosis not present

## 2022-02-08 DIAGNOSIS — M6281 Muscle weakness (generalized): Secondary | ICD-10-CM | POA: Diagnosis not present

## 2022-02-08 DIAGNOSIS — M6258 Muscle wasting and atrophy, not elsewhere classified, other site: Secondary | ICD-10-CM | POA: Diagnosis not present

## 2022-02-08 DIAGNOSIS — J449 Chronic obstructive pulmonary disease, unspecified: Secondary | ICD-10-CM | POA: Diagnosis not present

## 2022-02-09 DIAGNOSIS — M6258 Muscle wasting and atrophy, not elsewhere classified, other site: Secondary | ICD-10-CM | POA: Diagnosis not present

## 2022-02-09 DIAGNOSIS — J449 Chronic obstructive pulmonary disease, unspecified: Secondary | ICD-10-CM | POA: Diagnosis not present

## 2022-02-09 DIAGNOSIS — M6281 Muscle weakness (generalized): Secondary | ICD-10-CM | POA: Diagnosis not present

## 2022-02-10 DIAGNOSIS — M6258 Muscle wasting and atrophy, not elsewhere classified, other site: Secondary | ICD-10-CM | POA: Diagnosis not present

## 2022-02-10 DIAGNOSIS — J449 Chronic obstructive pulmonary disease, unspecified: Secondary | ICD-10-CM | POA: Diagnosis not present

## 2022-02-10 DIAGNOSIS — M6281 Muscle weakness (generalized): Secondary | ICD-10-CM | POA: Diagnosis not present

## 2022-02-11 DIAGNOSIS — Z79899 Other long term (current) drug therapy: Secondary | ICD-10-CM | POA: Diagnosis not present

## 2022-02-11 DIAGNOSIS — J449 Chronic obstructive pulmonary disease, unspecified: Secondary | ICD-10-CM | POA: Diagnosis not present

## 2022-02-11 DIAGNOSIS — M6281 Muscle weakness (generalized): Secondary | ICD-10-CM | POA: Diagnosis not present

## 2022-02-11 DIAGNOSIS — M6258 Muscle wasting and atrophy, not elsewhere classified, other site: Secondary | ICD-10-CM | POA: Diagnosis not present

## 2022-02-14 DIAGNOSIS — E1122 Type 2 diabetes mellitus with diabetic chronic kidney disease: Secondary | ICD-10-CM | POA: Diagnosis not present

## 2022-02-14 DIAGNOSIS — F0390 Unspecified dementia without behavioral disturbance: Secondary | ICD-10-CM | POA: Diagnosis not present

## 2022-02-14 DIAGNOSIS — M6281 Muscle weakness (generalized): Secondary | ICD-10-CM | POA: Diagnosis not present

## 2022-02-14 DIAGNOSIS — N184 Chronic kidney disease, stage 4 (severe): Secondary | ICD-10-CM | POA: Diagnosis not present

## 2022-02-14 DIAGNOSIS — J449 Chronic obstructive pulmonary disease, unspecified: Secondary | ICD-10-CM | POA: Diagnosis not present

## 2022-02-14 DIAGNOSIS — M1990 Unspecified osteoarthritis, unspecified site: Secondary | ICD-10-CM | POA: Diagnosis not present

## 2022-02-14 DIAGNOSIS — M6258 Muscle wasting and atrophy, not elsewhere classified, other site: Secondary | ICD-10-CM | POA: Diagnosis not present

## 2022-02-15 DIAGNOSIS — M6281 Muscle weakness (generalized): Secondary | ICD-10-CM | POA: Diagnosis not present

## 2022-02-15 DIAGNOSIS — M6258 Muscle wasting and atrophy, not elsewhere classified, other site: Secondary | ICD-10-CM | POA: Diagnosis not present

## 2022-02-15 DIAGNOSIS — J449 Chronic obstructive pulmonary disease, unspecified: Secondary | ICD-10-CM | POA: Diagnosis not present

## 2022-02-17 DIAGNOSIS — M6258 Muscle wasting and atrophy, not elsewhere classified, other site: Secondary | ICD-10-CM | POA: Diagnosis not present

## 2022-02-17 DIAGNOSIS — I4891 Unspecified atrial fibrillation: Secondary | ICD-10-CM | POA: Diagnosis not present

## 2022-02-17 DIAGNOSIS — M6281 Muscle weakness (generalized): Secondary | ICD-10-CM | POA: Diagnosis not present

## 2022-02-17 DIAGNOSIS — J449 Chronic obstructive pulmonary disease, unspecified: Secondary | ICD-10-CM | POA: Diagnosis not present

## 2022-02-17 DIAGNOSIS — Z9189 Other specified personal risk factors, not elsewhere classified: Secondary | ICD-10-CM | POA: Diagnosis not present

## 2022-02-17 DIAGNOSIS — I5032 Chronic diastolic (congestive) heart failure: Secondary | ICD-10-CM | POA: Diagnosis not present

## 2022-02-17 DIAGNOSIS — I152 Hypertension secondary to endocrine disorders: Secondary | ICD-10-CM | POA: Diagnosis not present

## 2022-02-17 DIAGNOSIS — F0390 Unspecified dementia without behavioral disturbance: Secondary | ICD-10-CM | POA: Diagnosis not present

## 2022-02-18 DIAGNOSIS — M6258 Muscle wasting and atrophy, not elsewhere classified, other site: Secondary | ICD-10-CM | POA: Diagnosis not present

## 2022-02-18 DIAGNOSIS — M6281 Muscle weakness (generalized): Secondary | ICD-10-CM | POA: Diagnosis not present

## 2022-02-18 DIAGNOSIS — J449 Chronic obstructive pulmonary disease, unspecified: Secondary | ICD-10-CM | POA: Diagnosis not present

## 2022-02-21 DIAGNOSIS — M6281 Muscle weakness (generalized): Secondary | ICD-10-CM | POA: Diagnosis not present

## 2022-02-21 DIAGNOSIS — M6258 Muscle wasting and atrophy, not elsewhere classified, other site: Secondary | ICD-10-CM | POA: Diagnosis not present

## 2022-02-21 DIAGNOSIS — J449 Chronic obstructive pulmonary disease, unspecified: Secondary | ICD-10-CM | POA: Diagnosis not present

## 2022-02-24 DIAGNOSIS — M6281 Muscle weakness (generalized): Secondary | ICD-10-CM | POA: Diagnosis not present

## 2022-02-24 DIAGNOSIS — M6258 Muscle wasting and atrophy, not elsewhere classified, other site: Secondary | ICD-10-CM | POA: Diagnosis not present

## 2022-02-24 DIAGNOSIS — J449 Chronic obstructive pulmonary disease, unspecified: Secondary | ICD-10-CM | POA: Diagnosis not present

## 2022-02-25 DIAGNOSIS — M6281 Muscle weakness (generalized): Secondary | ICD-10-CM | POA: Diagnosis not present

## 2022-02-25 DIAGNOSIS — M6258 Muscle wasting and atrophy, not elsewhere classified, other site: Secondary | ICD-10-CM | POA: Diagnosis not present

## 2022-02-25 DIAGNOSIS — J449 Chronic obstructive pulmonary disease, unspecified: Secondary | ICD-10-CM | POA: Diagnosis not present

## 2022-02-28 DIAGNOSIS — M6281 Muscle weakness (generalized): Secondary | ICD-10-CM | POA: Diagnosis not present

## 2022-02-28 DIAGNOSIS — J449 Chronic obstructive pulmonary disease, unspecified: Secondary | ICD-10-CM | POA: Diagnosis not present

## 2022-02-28 DIAGNOSIS — M6258 Muscle wasting and atrophy, not elsewhere classified, other site: Secondary | ICD-10-CM | POA: Diagnosis not present

## 2022-03-01 DIAGNOSIS — J449 Chronic obstructive pulmonary disease, unspecified: Secondary | ICD-10-CM | POA: Diagnosis not present

## 2022-03-01 DIAGNOSIS — M6281 Muscle weakness (generalized): Secondary | ICD-10-CM | POA: Diagnosis not present

## 2022-03-01 DIAGNOSIS — M6258 Muscle wasting and atrophy, not elsewhere classified, other site: Secondary | ICD-10-CM | POA: Diagnosis not present

## 2022-03-02 DIAGNOSIS — M6281 Muscle weakness (generalized): Secondary | ICD-10-CM | POA: Diagnosis not present

## 2022-03-02 DIAGNOSIS — M6258 Muscle wasting and atrophy, not elsewhere classified, other site: Secondary | ICD-10-CM | POA: Diagnosis not present

## 2022-03-02 DIAGNOSIS — J449 Chronic obstructive pulmonary disease, unspecified: Secondary | ICD-10-CM | POA: Diagnosis not present

## 2022-03-03 DIAGNOSIS — M6258 Muscle wasting and atrophy, not elsewhere classified, other site: Secondary | ICD-10-CM | POA: Diagnosis not present

## 2022-03-03 DIAGNOSIS — M6281 Muscle weakness (generalized): Secondary | ICD-10-CM | POA: Diagnosis not present

## 2022-03-03 DIAGNOSIS — J449 Chronic obstructive pulmonary disease, unspecified: Secondary | ICD-10-CM | POA: Diagnosis not present

## 2022-03-04 DIAGNOSIS — J449 Chronic obstructive pulmonary disease, unspecified: Secondary | ICD-10-CM | POA: Diagnosis not present

## 2022-03-04 DIAGNOSIS — M6281 Muscle weakness (generalized): Secondary | ICD-10-CM | POA: Diagnosis not present

## 2022-03-04 DIAGNOSIS — M6258 Muscle wasting and atrophy, not elsewhere classified, other site: Secondary | ICD-10-CM | POA: Diagnosis not present

## 2022-03-07 DIAGNOSIS — M6258 Muscle wasting and atrophy, not elsewhere classified, other site: Secondary | ICD-10-CM | POA: Diagnosis not present

## 2022-03-07 DIAGNOSIS — M6281 Muscle weakness (generalized): Secondary | ICD-10-CM | POA: Diagnosis not present

## 2022-03-07 DIAGNOSIS — J449 Chronic obstructive pulmonary disease, unspecified: Secondary | ICD-10-CM | POA: Diagnosis not present

## 2022-03-08 DIAGNOSIS — M6281 Muscle weakness (generalized): Secondary | ICD-10-CM | POA: Diagnosis not present

## 2022-03-08 DIAGNOSIS — M6258 Muscle wasting and atrophy, not elsewhere classified, other site: Secondary | ICD-10-CM | POA: Diagnosis not present

## 2022-03-08 DIAGNOSIS — J449 Chronic obstructive pulmonary disease, unspecified: Secondary | ICD-10-CM | POA: Diagnosis not present

## 2022-03-09 DIAGNOSIS — M6281 Muscle weakness (generalized): Secondary | ICD-10-CM | POA: Diagnosis not present

## 2022-03-09 DIAGNOSIS — Z23 Encounter for immunization: Secondary | ICD-10-CM | POA: Diagnosis not present

## 2022-03-09 DIAGNOSIS — M6258 Muscle wasting and atrophy, not elsewhere classified, other site: Secondary | ICD-10-CM | POA: Diagnosis not present

## 2022-03-09 DIAGNOSIS — J449 Chronic obstructive pulmonary disease, unspecified: Secondary | ICD-10-CM | POA: Diagnosis not present

## 2022-03-10 DIAGNOSIS — M6258 Muscle wasting and atrophy, not elsewhere classified, other site: Secondary | ICD-10-CM | POA: Diagnosis not present

## 2022-03-10 DIAGNOSIS — J449 Chronic obstructive pulmonary disease, unspecified: Secondary | ICD-10-CM | POA: Diagnosis not present

## 2022-03-10 DIAGNOSIS — M6281 Muscle weakness (generalized): Secondary | ICD-10-CM | POA: Diagnosis not present

## 2022-04-01 DIAGNOSIS — I4891 Unspecified atrial fibrillation: Secondary | ICD-10-CM | POA: Diagnosis not present

## 2022-04-01 DIAGNOSIS — E0841 Diabetes mellitus due to underlying condition with diabetic mononeuropathy: Secondary | ICD-10-CM | POA: Diagnosis not present

## 2022-04-01 DIAGNOSIS — Z6837 Body mass index (BMI) 37.0-37.9, adult: Secondary | ICD-10-CM | POA: Diagnosis not present

## 2022-04-01 DIAGNOSIS — I69354 Hemiplegia and hemiparesis following cerebral infarction affecting left non-dominant side: Secondary | ICD-10-CM | POA: Diagnosis not present

## 2022-04-01 DIAGNOSIS — I5032 Chronic diastolic (congestive) heart failure: Secondary | ICD-10-CM | POA: Diagnosis not present

## 2022-04-01 DIAGNOSIS — I150 Renovascular hypertension: Secondary | ICD-10-CM | POA: Diagnosis not present

## 2022-04-01 DIAGNOSIS — I2510 Atherosclerotic heart disease of native coronary artery without angina pectoris: Secondary | ICD-10-CM | POA: Diagnosis not present

## 2022-04-01 DIAGNOSIS — J449 Chronic obstructive pulmonary disease, unspecified: Secondary | ICD-10-CM | POA: Diagnosis not present

## 2022-04-01 DIAGNOSIS — Z7901 Long term (current) use of anticoagulants: Secondary | ICD-10-CM | POA: Diagnosis not present

## 2022-04-01 DIAGNOSIS — E662 Morbid (severe) obesity with alveolar hypoventilation: Secondary | ICD-10-CM | POA: Diagnosis not present

## 2022-04-01 DIAGNOSIS — I152 Hypertension secondary to endocrine disorders: Secondary | ICD-10-CM | POA: Diagnosis not present

## 2022-04-05 DIAGNOSIS — I10 Essential (primary) hypertension: Secondary | ICD-10-CM | POA: Diagnosis not present

## 2022-04-12 DIAGNOSIS — Z23 Encounter for immunization: Secondary | ICD-10-CM | POA: Diagnosis not present

## 2022-04-12 DIAGNOSIS — L729 Follicular cyst of the skin and subcutaneous tissue, unspecified: Secondary | ICD-10-CM | POA: Diagnosis not present

## 2022-04-13 DIAGNOSIS — H04123 Dry eye syndrome of bilateral lacrimal glands: Secondary | ICD-10-CM | POA: Diagnosis not present

## 2022-04-13 DIAGNOSIS — H4010X0 Unspecified open-angle glaucoma, stage unspecified: Secondary | ICD-10-CM | POA: Diagnosis not present

## 2022-04-13 DIAGNOSIS — Z7982 Long term (current) use of aspirin: Secondary | ICD-10-CM | POA: Diagnosis not present

## 2022-04-13 DIAGNOSIS — M7989 Other specified soft tissue disorders: Secondary | ICD-10-CM | POA: Diagnosis not present

## 2022-04-13 DIAGNOSIS — I4891 Unspecified atrial fibrillation: Secondary | ICD-10-CM | POA: Diagnosis not present

## 2022-04-13 DIAGNOSIS — Z7901 Long term (current) use of anticoagulants: Secondary | ICD-10-CM | POA: Diagnosis not present

## 2022-04-25 DIAGNOSIS — H4010X0 Unspecified open-angle glaucoma, stage unspecified: Secondary | ICD-10-CM | POA: Diagnosis not present

## 2022-04-25 DIAGNOSIS — H109 Unspecified conjunctivitis: Secondary | ICD-10-CM | POA: Diagnosis not present

## 2022-04-25 DIAGNOSIS — H04129 Dry eye syndrome of unspecified lacrimal gland: Secondary | ICD-10-CM | POA: Diagnosis not present

## 2022-04-25 DIAGNOSIS — Z79899 Other long term (current) drug therapy: Secondary | ICD-10-CM | POA: Diagnosis not present

## 2022-04-25 DIAGNOSIS — H269 Unspecified cataract: Secondary | ICD-10-CM | POA: Diagnosis not present

## 2022-05-05 DIAGNOSIS — Z23 Encounter for immunization: Secondary | ICD-10-CM | POA: Diagnosis not present

## 2022-05-10 DIAGNOSIS — Z7984 Long term (current) use of oral hypoglycemic drugs: Secondary | ICD-10-CM | POA: Diagnosis not present

## 2022-05-10 DIAGNOSIS — Z6837 Body mass index (BMI) 37.0-37.9, adult: Secondary | ICD-10-CM | POA: Diagnosis not present

## 2022-05-10 DIAGNOSIS — I110 Hypertensive heart disease with heart failure: Secondary | ICD-10-CM | POA: Diagnosis not present

## 2022-05-10 DIAGNOSIS — E119 Type 2 diabetes mellitus without complications: Secondary | ICD-10-CM | POA: Diagnosis not present

## 2022-05-10 DIAGNOSIS — I152 Hypertension secondary to endocrine disorders: Secondary | ICD-10-CM | POA: Diagnosis not present

## 2022-05-10 DIAGNOSIS — I5032 Chronic diastolic (congestive) heart failure: Secondary | ICD-10-CM | POA: Diagnosis not present

## 2022-05-23 DIAGNOSIS — J449 Chronic obstructive pulmonary disease, unspecified: Secondary | ICD-10-CM | POA: Diagnosis not present

## 2022-05-23 DIAGNOSIS — R2689 Other abnormalities of gait and mobility: Secondary | ICD-10-CM | POA: Diagnosis not present

## 2022-05-23 DIAGNOSIS — M6281 Muscle weakness (generalized): Secondary | ICD-10-CM | POA: Diagnosis not present

## 2022-05-24 DIAGNOSIS — J449 Chronic obstructive pulmonary disease, unspecified: Secondary | ICD-10-CM | POA: Diagnosis not present

## 2022-05-24 DIAGNOSIS — R2689 Other abnormalities of gait and mobility: Secondary | ICD-10-CM | POA: Diagnosis not present

## 2022-05-24 DIAGNOSIS — M6281 Muscle weakness (generalized): Secondary | ICD-10-CM | POA: Diagnosis not present

## 2022-05-25 DIAGNOSIS — M6281 Muscle weakness (generalized): Secondary | ICD-10-CM | POA: Diagnosis not present

## 2022-05-25 DIAGNOSIS — R2689 Other abnormalities of gait and mobility: Secondary | ICD-10-CM | POA: Diagnosis not present

## 2022-05-25 DIAGNOSIS — J449 Chronic obstructive pulmonary disease, unspecified: Secondary | ICD-10-CM | POA: Diagnosis not present

## 2022-05-26 DIAGNOSIS — J449 Chronic obstructive pulmonary disease, unspecified: Secondary | ICD-10-CM | POA: Diagnosis not present

## 2022-05-26 DIAGNOSIS — M6281 Muscle weakness (generalized): Secondary | ICD-10-CM | POA: Diagnosis not present

## 2022-05-26 DIAGNOSIS — R2689 Other abnormalities of gait and mobility: Secondary | ICD-10-CM | POA: Diagnosis not present

## 2022-05-27 DIAGNOSIS — R2689 Other abnormalities of gait and mobility: Secondary | ICD-10-CM | POA: Diagnosis not present

## 2022-05-27 DIAGNOSIS — J449 Chronic obstructive pulmonary disease, unspecified: Secondary | ICD-10-CM | POA: Diagnosis not present

## 2022-05-27 DIAGNOSIS — M6281 Muscle weakness (generalized): Secondary | ICD-10-CM | POA: Diagnosis not present

## 2022-05-30 DIAGNOSIS — J449 Chronic obstructive pulmonary disease, unspecified: Secondary | ICD-10-CM | POA: Diagnosis not present

## 2022-05-30 DIAGNOSIS — R2689 Other abnormalities of gait and mobility: Secondary | ICD-10-CM | POA: Diagnosis not present

## 2022-05-30 DIAGNOSIS — M6281 Muscle weakness (generalized): Secondary | ICD-10-CM | POA: Diagnosis not present

## 2022-05-31 DIAGNOSIS — J449 Chronic obstructive pulmonary disease, unspecified: Secondary | ICD-10-CM | POA: Diagnosis not present

## 2022-05-31 DIAGNOSIS — M6281 Muscle weakness (generalized): Secondary | ICD-10-CM | POA: Diagnosis not present

## 2022-05-31 DIAGNOSIS — R2689 Other abnormalities of gait and mobility: Secondary | ICD-10-CM | POA: Diagnosis not present

## 2022-06-01 DIAGNOSIS — N182 Chronic kidney disease, stage 2 (mild): Secondary | ICD-10-CM | POA: Diagnosis not present

## 2022-06-01 DIAGNOSIS — I130 Hypertensive heart and chronic kidney disease with heart failure and stage 1 through stage 4 chronic kidney disease, or unspecified chronic kidney disease: Secondary | ICD-10-CM | POA: Diagnosis not present

## 2022-06-01 DIAGNOSIS — I4891 Unspecified atrial fibrillation: Secondary | ICD-10-CM | POA: Diagnosis not present

## 2022-06-01 DIAGNOSIS — Z9981 Dependence on supplemental oxygen: Secondary | ICD-10-CM | POA: Diagnosis not present

## 2022-06-01 DIAGNOSIS — M6281 Muscle weakness (generalized): Secondary | ICD-10-CM | POA: Diagnosis not present

## 2022-06-01 DIAGNOSIS — Z7985 Long-term (current) use of injectable non-insulin antidiabetic drugs: Secondary | ICD-10-CM | POA: Diagnosis not present

## 2022-06-01 DIAGNOSIS — Z7901 Long term (current) use of anticoagulants: Secondary | ICD-10-CM | POA: Diagnosis not present

## 2022-06-01 DIAGNOSIS — Z7982 Long term (current) use of aspirin: Secondary | ICD-10-CM | POA: Diagnosis not present

## 2022-06-01 DIAGNOSIS — E785 Hyperlipidemia, unspecified: Secondary | ICD-10-CM | POA: Diagnosis not present

## 2022-06-01 DIAGNOSIS — J449 Chronic obstructive pulmonary disease, unspecified: Secondary | ICD-10-CM | POA: Diagnosis not present

## 2022-06-01 DIAGNOSIS — E1122 Type 2 diabetes mellitus with diabetic chronic kidney disease: Secondary | ICD-10-CM | POA: Diagnosis not present

## 2022-06-01 DIAGNOSIS — R2689 Other abnormalities of gait and mobility: Secondary | ICD-10-CM | POA: Diagnosis not present

## 2022-06-01 DIAGNOSIS — I5032 Chronic diastolic (congestive) heart failure: Secondary | ICD-10-CM | POA: Diagnosis not present

## 2022-06-01 DIAGNOSIS — D6869 Other thrombophilia: Secondary | ICD-10-CM | POA: Diagnosis not present

## 2022-06-02 DIAGNOSIS — M6281 Muscle weakness (generalized): Secondary | ICD-10-CM | POA: Diagnosis not present

## 2022-06-02 DIAGNOSIS — R2689 Other abnormalities of gait and mobility: Secondary | ICD-10-CM | POA: Diagnosis not present

## 2022-06-02 DIAGNOSIS — J449 Chronic obstructive pulmonary disease, unspecified: Secondary | ICD-10-CM | POA: Diagnosis not present

## 2022-06-03 DIAGNOSIS — J449 Chronic obstructive pulmonary disease, unspecified: Secondary | ICD-10-CM | POA: Diagnosis not present

## 2022-06-03 DIAGNOSIS — R2689 Other abnormalities of gait and mobility: Secondary | ICD-10-CM | POA: Diagnosis not present

## 2022-06-03 DIAGNOSIS — M6281 Muscle weakness (generalized): Secondary | ICD-10-CM | POA: Diagnosis not present

## 2022-06-06 DIAGNOSIS — M6281 Muscle weakness (generalized): Secondary | ICD-10-CM | POA: Diagnosis not present

## 2022-06-06 DIAGNOSIS — R2689 Other abnormalities of gait and mobility: Secondary | ICD-10-CM | POA: Diagnosis not present

## 2022-06-06 DIAGNOSIS — J449 Chronic obstructive pulmonary disease, unspecified: Secondary | ICD-10-CM | POA: Diagnosis not present

## 2022-06-07 DIAGNOSIS — H4010X0 Unspecified open-angle glaucoma, stage unspecified: Secondary | ICD-10-CM | POA: Diagnosis not present

## 2022-06-07 DIAGNOSIS — Z8673 Personal history of transient ischemic attack (TIA), and cerebral infarction without residual deficits: Secondary | ICD-10-CM | POA: Diagnosis not present

## 2022-06-07 DIAGNOSIS — R519 Headache, unspecified: Secondary | ICD-10-CM | POA: Diagnosis not present

## 2022-06-07 DIAGNOSIS — J449 Chronic obstructive pulmonary disease, unspecified: Secondary | ICD-10-CM | POA: Diagnosis not present

## 2022-06-07 DIAGNOSIS — E119 Type 2 diabetes mellitus without complications: Secondary | ICD-10-CM | POA: Diagnosis not present

## 2022-06-07 DIAGNOSIS — I10 Essential (primary) hypertension: Secondary | ICD-10-CM | POA: Diagnosis not present

## 2022-06-07 DIAGNOSIS — H349 Unspecified retinal vascular occlusion: Secondary | ICD-10-CM | POA: Diagnosis not present

## 2022-06-07 DIAGNOSIS — Z66 Do not resuscitate: Secondary | ICD-10-CM | POA: Diagnosis not present

## 2022-06-07 DIAGNOSIS — R2689 Other abnormalities of gait and mobility: Secondary | ICD-10-CM | POA: Diagnosis not present

## 2022-06-07 DIAGNOSIS — R7989 Other specified abnormal findings of blood chemistry: Secondary | ICD-10-CM | POA: Diagnosis not present

## 2022-06-07 DIAGNOSIS — D519 Vitamin B12 deficiency anemia, unspecified: Secondary | ICD-10-CM | POA: Diagnosis not present

## 2022-06-07 DIAGNOSIS — M6281 Muscle weakness (generalized): Secondary | ICD-10-CM | POA: Diagnosis not present

## 2022-06-07 DIAGNOSIS — I2510 Atherosclerotic heart disease of native coronary artery without angina pectoris: Secondary | ICD-10-CM | POA: Diagnosis not present

## 2022-06-07 DIAGNOSIS — E785 Hyperlipidemia, unspecified: Secondary | ICD-10-CM | POA: Diagnosis not present

## 2022-06-07 DIAGNOSIS — Z79899 Other long term (current) drug therapy: Secondary | ICD-10-CM | POA: Diagnosis not present

## 2022-06-07 DIAGNOSIS — H109 Unspecified conjunctivitis: Secondary | ICD-10-CM | POA: Diagnosis not present

## 2022-06-08 DIAGNOSIS — M6281 Muscle weakness (generalized): Secondary | ICD-10-CM | POA: Diagnosis not present

## 2022-06-08 DIAGNOSIS — J449 Chronic obstructive pulmonary disease, unspecified: Secondary | ICD-10-CM | POA: Diagnosis not present

## 2022-06-08 DIAGNOSIS — R2689 Other abnormalities of gait and mobility: Secondary | ICD-10-CM | POA: Diagnosis not present

## 2022-06-09 DIAGNOSIS — J449 Chronic obstructive pulmonary disease, unspecified: Secondary | ICD-10-CM | POA: Diagnosis not present

## 2022-06-09 DIAGNOSIS — H5711 Ocular pain, right eye: Secondary | ICD-10-CM | POA: Diagnosis not present

## 2022-06-09 DIAGNOSIS — M6281 Muscle weakness (generalized): Secondary | ICD-10-CM | POA: Diagnosis not present

## 2022-06-09 DIAGNOSIS — R2689 Other abnormalities of gait and mobility: Secondary | ICD-10-CM | POA: Diagnosis not present

## 2022-06-09 DIAGNOSIS — H5440 Blindness, one eye, unspecified eye: Secondary | ICD-10-CM | POA: Diagnosis not present

## 2022-06-10 DIAGNOSIS — R2689 Other abnormalities of gait and mobility: Secondary | ICD-10-CM | POA: Diagnosis not present

## 2022-06-10 DIAGNOSIS — J449 Chronic obstructive pulmonary disease, unspecified: Secondary | ICD-10-CM | POA: Diagnosis not present

## 2022-06-10 DIAGNOSIS — M6281 Muscle weakness (generalized): Secondary | ICD-10-CM | POA: Diagnosis not present

## 2022-06-13 DIAGNOSIS — M6281 Muscle weakness (generalized): Secondary | ICD-10-CM | POA: Diagnosis not present

## 2022-06-13 DIAGNOSIS — J449 Chronic obstructive pulmonary disease, unspecified: Secondary | ICD-10-CM | POA: Diagnosis not present

## 2022-06-13 DIAGNOSIS — R2689 Other abnormalities of gait and mobility: Secondary | ICD-10-CM | POA: Diagnosis not present

## 2022-06-14 DIAGNOSIS — J449 Chronic obstructive pulmonary disease, unspecified: Secondary | ICD-10-CM | POA: Diagnosis not present

## 2022-06-14 DIAGNOSIS — M6281 Muscle weakness (generalized): Secondary | ICD-10-CM | POA: Diagnosis not present

## 2022-06-14 DIAGNOSIS — R2689 Other abnormalities of gait and mobility: Secondary | ICD-10-CM | POA: Diagnosis not present

## 2022-06-15 DIAGNOSIS — M6281 Muscle weakness (generalized): Secondary | ICD-10-CM | POA: Diagnosis not present

## 2022-06-15 DIAGNOSIS — J449 Chronic obstructive pulmonary disease, unspecified: Secondary | ICD-10-CM | POA: Diagnosis not present

## 2022-06-15 DIAGNOSIS — R2689 Other abnormalities of gait and mobility: Secondary | ICD-10-CM | POA: Diagnosis not present

## 2022-06-16 DIAGNOSIS — J449 Chronic obstructive pulmonary disease, unspecified: Secondary | ICD-10-CM | POA: Diagnosis not present

## 2022-06-16 DIAGNOSIS — R2689 Other abnormalities of gait and mobility: Secondary | ICD-10-CM | POA: Diagnosis not present

## 2022-06-16 DIAGNOSIS — M6281 Muscle weakness (generalized): Secondary | ICD-10-CM | POA: Diagnosis not present

## 2022-06-17 DIAGNOSIS — M6281 Muscle weakness (generalized): Secondary | ICD-10-CM | POA: Diagnosis not present

## 2022-06-17 DIAGNOSIS — J449 Chronic obstructive pulmonary disease, unspecified: Secondary | ICD-10-CM | POA: Diagnosis not present

## 2022-06-17 DIAGNOSIS — R2689 Other abnormalities of gait and mobility: Secondary | ICD-10-CM | POA: Diagnosis not present

## 2022-06-20 DIAGNOSIS — M6281 Muscle weakness (generalized): Secondary | ICD-10-CM | POA: Diagnosis not present

## 2022-06-20 DIAGNOSIS — R2689 Other abnormalities of gait and mobility: Secondary | ICD-10-CM | POA: Diagnosis not present

## 2022-06-20 DIAGNOSIS — J449 Chronic obstructive pulmonary disease, unspecified: Secondary | ICD-10-CM | POA: Diagnosis not present

## 2022-06-21 DIAGNOSIS — J449 Chronic obstructive pulmonary disease, unspecified: Secondary | ICD-10-CM | POA: Diagnosis not present

## 2022-06-21 DIAGNOSIS — R2689 Other abnormalities of gait and mobility: Secondary | ICD-10-CM | POA: Diagnosis not present

## 2022-06-21 DIAGNOSIS — M6281 Muscle weakness (generalized): Secondary | ICD-10-CM | POA: Diagnosis not present

## 2022-06-22 DIAGNOSIS — R2689 Other abnormalities of gait and mobility: Secondary | ICD-10-CM | POA: Diagnosis not present

## 2022-06-22 DIAGNOSIS — J449 Chronic obstructive pulmonary disease, unspecified: Secondary | ICD-10-CM | POA: Diagnosis not present

## 2022-06-22 DIAGNOSIS — M6281 Muscle weakness (generalized): Secondary | ICD-10-CM | POA: Diagnosis not present

## 2022-06-23 DIAGNOSIS — M6281 Muscle weakness (generalized): Secondary | ICD-10-CM | POA: Diagnosis not present

## 2022-06-23 DIAGNOSIS — R2689 Other abnormalities of gait and mobility: Secondary | ICD-10-CM | POA: Diagnosis not present

## 2022-06-23 DIAGNOSIS — J449 Chronic obstructive pulmonary disease, unspecified: Secondary | ICD-10-CM | POA: Diagnosis not present

## 2022-06-24 DIAGNOSIS — R2689 Other abnormalities of gait and mobility: Secondary | ICD-10-CM | POA: Diagnosis not present

## 2022-06-24 DIAGNOSIS — J449 Chronic obstructive pulmonary disease, unspecified: Secondary | ICD-10-CM | POA: Diagnosis not present

## 2022-06-24 DIAGNOSIS — M6281 Muscle weakness (generalized): Secondary | ICD-10-CM | POA: Diagnosis not present

## 2022-06-27 DIAGNOSIS — J449 Chronic obstructive pulmonary disease, unspecified: Secondary | ICD-10-CM | POA: Diagnosis not present

## 2022-06-27 DIAGNOSIS — M6281 Muscle weakness (generalized): Secondary | ICD-10-CM | POA: Diagnosis not present

## 2022-06-27 DIAGNOSIS — R2689 Other abnormalities of gait and mobility: Secondary | ICD-10-CM | POA: Diagnosis not present

## 2022-06-28 DIAGNOSIS — J449 Chronic obstructive pulmonary disease, unspecified: Secondary | ICD-10-CM | POA: Diagnosis not present

## 2022-06-28 DIAGNOSIS — R2689 Other abnormalities of gait and mobility: Secondary | ICD-10-CM | POA: Diagnosis not present

## 2022-06-28 DIAGNOSIS — M6281 Muscle weakness (generalized): Secondary | ICD-10-CM | POA: Diagnosis not present

## 2022-06-29 DIAGNOSIS — R2689 Other abnormalities of gait and mobility: Secondary | ICD-10-CM | POA: Diagnosis not present

## 2022-06-29 DIAGNOSIS — M6281 Muscle weakness (generalized): Secondary | ICD-10-CM | POA: Diagnosis not present

## 2022-06-29 DIAGNOSIS — J449 Chronic obstructive pulmonary disease, unspecified: Secondary | ICD-10-CM | POA: Diagnosis not present

## 2022-06-30 DIAGNOSIS — J449 Chronic obstructive pulmonary disease, unspecified: Secondary | ICD-10-CM | POA: Diagnosis not present

## 2022-06-30 DIAGNOSIS — I2510 Atherosclerotic heart disease of native coronary artery without angina pectoris: Secondary | ICD-10-CM | POA: Diagnosis not present

## 2022-06-30 DIAGNOSIS — H1089 Other conjunctivitis: Secondary | ICD-10-CM | POA: Diagnosis not present

## 2022-06-30 DIAGNOSIS — R2689 Other abnormalities of gait and mobility: Secondary | ICD-10-CM | POA: Diagnosis not present

## 2022-06-30 DIAGNOSIS — M25531 Pain in right wrist: Secondary | ICD-10-CM | POA: Diagnosis not present

## 2022-06-30 DIAGNOSIS — E1122 Type 2 diabetes mellitus with diabetic chronic kidney disease: Secondary | ICD-10-CM | POA: Diagnosis not present

## 2022-06-30 DIAGNOSIS — Z7985 Long-term (current) use of injectable non-insulin antidiabetic drugs: Secondary | ICD-10-CM | POA: Diagnosis not present

## 2022-06-30 DIAGNOSIS — N189 Chronic kidney disease, unspecified: Secondary | ICD-10-CM | POA: Diagnosis not present

## 2022-06-30 DIAGNOSIS — M6281 Muscle weakness (generalized): Secondary | ICD-10-CM | POA: Diagnosis not present

## 2022-06-30 DIAGNOSIS — Z7982 Long term (current) use of aspirin: Secondary | ICD-10-CM | POA: Diagnosis not present

## 2022-06-30 DIAGNOSIS — E785 Hyperlipidemia, unspecified: Secondary | ICD-10-CM | POA: Diagnosis not present

## 2022-07-01 DIAGNOSIS — J449 Chronic obstructive pulmonary disease, unspecified: Secondary | ICD-10-CM | POA: Diagnosis not present

## 2022-07-01 DIAGNOSIS — M6281 Muscle weakness (generalized): Secondary | ICD-10-CM | POA: Diagnosis not present

## 2022-07-01 DIAGNOSIS — R2689 Other abnormalities of gait and mobility: Secondary | ICD-10-CM | POA: Diagnosis not present

## 2022-07-05 DIAGNOSIS — R2689 Other abnormalities of gait and mobility: Secondary | ICD-10-CM | POA: Diagnosis not present

## 2022-07-05 DIAGNOSIS — M6281 Muscle weakness (generalized): Secondary | ICD-10-CM | POA: Diagnosis not present

## 2022-07-05 DIAGNOSIS — J449 Chronic obstructive pulmonary disease, unspecified: Secondary | ICD-10-CM | POA: Diagnosis not present

## 2022-07-06 DIAGNOSIS — M6281 Muscle weakness (generalized): Secondary | ICD-10-CM | POA: Diagnosis not present

## 2022-07-06 DIAGNOSIS — R2689 Other abnormalities of gait and mobility: Secondary | ICD-10-CM | POA: Diagnosis not present

## 2022-07-06 DIAGNOSIS — J449 Chronic obstructive pulmonary disease, unspecified: Secondary | ICD-10-CM | POA: Diagnosis not present

## 2022-07-07 DIAGNOSIS — M6281 Muscle weakness (generalized): Secondary | ICD-10-CM | POA: Diagnosis not present

## 2022-07-07 DIAGNOSIS — H401134 Primary open-angle glaucoma, bilateral, indeterminate stage: Secondary | ICD-10-CM | POA: Diagnosis not present

## 2022-07-07 DIAGNOSIS — J449 Chronic obstructive pulmonary disease, unspecified: Secondary | ICD-10-CM | POA: Diagnosis not present

## 2022-07-07 DIAGNOSIS — R2689 Other abnormalities of gait and mobility: Secondary | ICD-10-CM | POA: Diagnosis not present

## 2022-07-08 DIAGNOSIS — R2689 Other abnormalities of gait and mobility: Secondary | ICD-10-CM | POA: Diagnosis not present

## 2022-07-08 DIAGNOSIS — J449 Chronic obstructive pulmonary disease, unspecified: Secondary | ICD-10-CM | POA: Diagnosis not present

## 2022-07-08 DIAGNOSIS — M6281 Muscle weakness (generalized): Secondary | ICD-10-CM | POA: Diagnosis not present

## 2022-07-09 DIAGNOSIS — M6281 Muscle weakness (generalized): Secondary | ICD-10-CM | POA: Diagnosis not present

## 2022-07-09 DIAGNOSIS — J449 Chronic obstructive pulmonary disease, unspecified: Secondary | ICD-10-CM | POA: Diagnosis not present

## 2022-07-09 DIAGNOSIS — R2689 Other abnormalities of gait and mobility: Secondary | ICD-10-CM | POA: Diagnosis not present

## 2022-07-26 DIAGNOSIS — I152 Hypertension secondary to endocrine disorders: Secondary | ICD-10-CM | POA: Diagnosis not present

## 2022-07-26 DIAGNOSIS — I69354 Hemiplegia and hemiparesis following cerebral infarction affecting left non-dominant side: Secondary | ICD-10-CM | POA: Diagnosis not present

## 2022-07-26 DIAGNOSIS — I5032 Chronic diastolic (congestive) heart failure: Secondary | ICD-10-CM | POA: Diagnosis not present

## 2022-07-26 DIAGNOSIS — Z9981 Dependence on supplemental oxygen: Secondary | ICD-10-CM | POA: Diagnosis not present

## 2022-07-26 DIAGNOSIS — Z6837 Body mass index (BMI) 37.0-37.9, adult: Secondary | ICD-10-CM | POA: Diagnosis not present

## 2022-07-26 DIAGNOSIS — Z7901 Long term (current) use of anticoagulants: Secondary | ICD-10-CM | POA: Diagnosis not present

## 2022-07-26 DIAGNOSIS — E785 Hyperlipidemia, unspecified: Secondary | ICD-10-CM | POA: Diagnosis not present

## 2022-07-26 DIAGNOSIS — E662 Morbid (severe) obesity with alveolar hypoventilation: Secondary | ICD-10-CM | POA: Diagnosis not present

## 2022-07-26 DIAGNOSIS — I4891 Unspecified atrial fibrillation: Secondary | ICD-10-CM | POA: Diagnosis not present

## 2022-07-26 DIAGNOSIS — J449 Chronic obstructive pulmonary disease, unspecified: Secondary | ICD-10-CM | POA: Diagnosis not present

## 2022-07-26 DIAGNOSIS — I2510 Atherosclerotic heart disease of native coronary artery without angina pectoris: Secondary | ICD-10-CM | POA: Diagnosis not present

## 2022-08-02 ENCOUNTER — Other Ambulatory Visit: Payer: Self-pay | Admitting: *Deleted

## 2022-08-02 DIAGNOSIS — I6523 Occlusion and stenosis of bilateral carotid arteries: Secondary | ICD-10-CM

## 2022-08-04 DIAGNOSIS — H4051X4 Glaucoma secondary to other eye disorders, right eye, indeterminate stage: Secondary | ICD-10-CM | POA: Diagnosis not present

## 2022-08-04 DIAGNOSIS — H401124 Primary open-angle glaucoma, left eye, indeterminate stage: Secondary | ICD-10-CM | POA: Diagnosis not present

## 2022-08-12 NOTE — Progress Notes (Unsigned)
HISTORY AND PHYSICAL     CC:  follow up. Requesting Provider:  [name], MD  HPI: This is a 87 y.o. male here for follow up for carotid artery stenosis.   Pt is s/p right TCAR for symptomatic carotid artery stenosis with central retinal artery occlusion on 06/12/2020 by Dr. Trula Slade.     Pt was last seen 04/22/2021 and at that time he did not have any new neurological sx.  He had not recovered any vision in the right eye.  He had patent right ICA stent and 1-39% left ICA stenosis.    He was still having chronic leg swelling and had seen Dr. Oneida Alar in the past who recommended life long compression socks or unna boots as he had hx of venous ulcers.  He had hx of bilateral GSV ablations in 2019 by Dr. Oneida Alar.  He had not been in his compression and we put him in 15-20mmHg knee high compression and instructed him to elevate his legs.    Pt returns today for follow up.    Pt *** any amaurosis fugax, speech difficulties, weakness, numbness, paralysis or clumsiness or facial droop.    ***  The pt is on a statin for cholesterol management.  The pt is on a daily aspirin.   Other AC:  Eliquis The pt is on ARB, BB for hypertension.   The pt does not have diabetes Tobacco hx:  never  Pt does *** have family hx of AAA.  Past Medical History:  Diagnosis Date   Anteroseptal myocardial infarction [facility])    Carotid artery occlusion    CHF (congestive heart failure) (HCC)    Chronic kidney disease    COPD (chronic obstructive pulmonary disease) (HCC)    Coronary atherosclerosis of unspecified type of vessel, native or graft    a. s/p prior PCI to LAD in 1997   Diverticulosis    ED (erectile dysfunction)    Foley catheter in place    Glaucoma    Hematuria    History of 2019 novel coronavirus disease (COVID-19) 11/2018   Hydronephrosis, bilateral    Hydroureter    Hypercholesteremia    Hypertensive renal disease    Hypogonadism male    Hypokalemia    Obesity hypoventilation  syndrome (HCC)    Obesity, unspecified    OSA (obstructive sleep apnea)    Peripheral vascular disease (HCC)    Permanent atrial fibrillation ([place])    Phimosis    Pneumonia    Rhabdomyolysis    Stroke ([place])    Tremor    Type II or unspecified type diabetes mellitus without mention of complication, not stated as uncontrolled    Unspecified essential hypertension    Urinary incontinence    Urinary retention    Vestibulopathy     Past Surgical History:  Procedure Laterality Date   APPENDECTOMY  1985   CARDIAC CATHETERIZATION     CHOLECYSTECTOMY  06/2000   CIRCUMCISION N/A 05/28/2020   Procedure: CIRCUMCISION ADULT;  Surgeon: Franchot Gallo, MD;  Location: [facility];  Service: Urology;  Laterality: N/A;   CORONARY ANGIOPLASTY  08/14/1995   stent placement to LAD    DORSAL SLIT N/A 05/28/2020   Procedure: DORSAL SLIT;  Surgeon: Franchot Gallo, MD;  Location: [facility];  Service: Urology;  Laterality: N/A;  45 MINS   ENDOVENOUS ABLATION SAPHENOUS VEIN W/ LASER Left 02/21/2018   endovenous laser ablation L GSV by Ruta Hinds MD    [place] Right [phone]  KNEE ARTHROSCOPY Left 1991   LUMBAR FUSION     NOSE SURGERY  1970s   Per Dr. Terrence Dupont [facility] in pt chart   TRANSCAROTID ARTERY REVASCULARIZATION  Right 06/12/2020   Procedure: RIGHT TRANSCAROTID ARTERY REVASCULARIZATION;  Surgeon: Serafina Mitchell, MD;  Location: [place];  Service: Vascular;  Laterality: Right;    Allergies  Allergen Reactions   Adhesive [Tape] Rash    Current Outpatient Medications  Medication Sig Dispense Refill   acetaminophen (TYLENOL) 325 MG tablet Take 650 mg by mouth every 4 (four) hours as needed for moderate pain (for pain).     albuterol (VENTOLIN HFA) 108 (90 Base) MCG/ACT inhaler Inhale 2 puffs into the lungs 2 (two) times daily as needed for wheezing or shortness of breath.     apixaban (ELIQUIS) 5 MG TABS tablet Take 1 tablet (5 mg total) by mouth 2 (two) times daily. 60 tablet 0    aspirin EC 81 MG EC tablet Take 1 tablet (81 mg total) by mouth daily. Swallow whole. 30 tablet 11   atorvastatin (LIPITOR) 40 MG tablet Take 40 mg by mouth at bedtime.     Cholecalciferol (VITAMIN D-3) 25 MCG (1000 UT) CAPS Take 2,000 Units by mouth daily.     Dextran 70-Hypromellose (ARTIFICIAL TEARS PF OP) Place 1 drop into both eyes 4 (four) times daily.     dorzolamide-timolol (COSOPT) 22.3-6.8 MG/ML ophthalmic solution Place 1 drop into both eyes 2 (two) times daily.     gabapentin (NEURONTIN) 100 MG capsule Take 2 capsules (200 mg total) by mouth at bedtime as needed.     hydrALAZINE (APRESOLINE) 10 MG tablet Take 10 mg by mouth daily as needed (sbp >160).     latanoprost (XALATAN) 0.005 % ophthalmic solution Place 1 drop into both eyes at bedtime.     losartan (COZAAR) 50 MG tablet Take 50 mg by mouth daily.     magnesium oxide (MAG-OX) 400 MG tablet Take 400 mg by mouth daily.     Melatonin 5 MG TABS Take 5 mg by mouth at bedtime.     Menthol, Topical Analgesic, (BIOFREEZE) 4 % GEL Apply 1 application. topically in the morning and at bedtime. Apply to neck/shoulder     metoprolol succinate (TOPROL XL) 25 MG 24 hr tablet Take 0.5 tablets (12.5 mg total) by mouth daily. 15 tablet 11   nitroGLYCERIN (NITROSTAT) 0.4 MG SL tablet Place 0.4 mg under the tongue every 5 (five) minutes x 3 doses as needed for chest pain (and CALL PROVIDER IF NO RELIEF AFTER THE 3RD DOSE).     OXYGEN Inhale 2 L into the lungs at bedtime.     polyethylene glycol (MIRALAX / GLYCOLAX) 17 g packet Take 17 g by mouth daily.     Semaglutide, 1 MG/DOSE, (OZEMPIC, 1 MG/DOSE,) 4 MG/3ML SOPN Inject 1 mg into the skin every Monday.     sennosides-docusate sodium (SENOKOT-S) 8.6-50 MG tablet Take 2 tablets by mouth 2 (two) times daily.     Skin Protectants, Misc. (EUCERIN) cream Apply 1 application. topically daily.     tamsulosin (FLOMAX) 0.4 MG CAPS capsule Take 1 capsule (0.4 mg total) by mouth daily after supper. 30  capsule 0   VASCEPA 1 g capsule Take 1 g by mouth every evening.     vitamin B-12 (CYANOCOBALAMIN) 1000 MCG tablet Take 500 mcg by mouth daily.     No current facility-administered medications for this visit.    Family History  Problem Relation Age  of Onset   COPD Mother    Heart disease Mother    Diabetes Father    Heart disease Father    Diabetes Sister    Heart disease Brother    Heart disease Brother    Breast cancer Maternal Aunt    Diabetes Paternal Grandmother    Colon cancer Maternal Aunt    Ovarian cancer Daughter    Glaucoma Other     Social History   Socioeconomic History   Marital status: Single    Spouse name: Not on file   Number of children: 5   Years of education: Not on file   Highest education level: Not on file  Occupational History   Occupation: retired  Tobacco Use   Smoking status: Never   Smokeless tobacco: Never  Vaping Use   Vaping Use: Never used  Substance and Sexual Activity   Alcohol use: No    Alcohol/week: 0.0 standard drinks of alcohol   Drug use: No   Sexual activity: Never  Other Topics Concern   Not on file  Social History Narrative   02/17/21 lives at [facility]   Social Determinants of Health   Financial Resource Strain: Not on file  Food Insecurity: Not on file  Transportation Needs: Not on file  Physical Activity: Not on file  Stress: Not on file  Social Connections: Not on file  Intimate Partner Violence: Not on file     REVIEW OF SYSTEMS:  *** '[X]'$  denotes positive finding, '[ ]'$  denotes negative finding Cardiac  Comments:  Chest pain or chest pressure:    Shortness of breath upon exertion:    Short of breath when lying flat:    Irregular heart rhythm:        Vascular    Pain in calf, thigh, or hip brought on by ambulation:    Pain in feet at night that wakes you up from your sleep:     Blood clot in your veins:    Leg swelling:         Pulmonary    Oxygen at home:    Productive cough:      Wheezing:         Neurologic    Sudden weakness in arms or legs:     Sudden numbness in arms or legs:     Sudden onset of difficulty speaking or slurred speech:    Temporary loss of vision in one eye:     Problems with dizziness:         Gastrointestinal    Blood in stool:     Vomited blood:         Genitourinary    Burning when urinating:     Blood in urine:        Psychiatric    Major depression:         Hematologic    Bleeding problems:    Problems with blood clotting too easily:        Skin    Rashes or ulcers:        Constitutional    Fever or chills:      PHYSICAL EXAMINATION:  ***  General:  WDWN in NAD; vital signs documented above Gait: Not observed HENT: WNL, normocephalic Pulmonary: normal non-labored breathing Cardiac: {Desc; regular/irreg:14544} HR, {With/Without:20273} carotid bruit*** Abdomen: soft, NT; aortic pulse is *** palpable Skin: {With/Without:20273} rashes Vascular Exam/Pulses:  Right Left  Radial {Exam; arterial pulse strength 0-4:30167} {Exam; arterial pulse strength 0-4:30167}  Popliteal {Exam; arterial pulse strength 0-4:30167} {Exam; arterial pulse strength 0-4:30167}  DP {Exam; arterial pulse strength 0-4:30167} {Exam; arterial pulse strength 0-4:30167}  PT {Exam; arterial pulse strength 0-4:30167} {Exam; arterial pulse strength 0-4:30167}   Extremities: {With/Without:20273} open wounds Musculoskeletal: no muscle wasting or atrophy  Neurologic: A&O X 3; moving all extremities equally; speech is fluent/normal Psychiatric:  The pt has {Desc; normal/abnormal:11317::"Normal"} affect.   Non-Invasive Vascular Imaging:   Carotid Duplex on 08/15/2022 Right:  ***% ICA stenosis Left:  ***% ICA stenosis ***  Previous Carotid duplex on 04/22/2021: Right: patent stent Left:   1-39% ICA stenosis    ASSESSMENT/PLAN:: 87 y.o. male here for follow up carotid artery stenosis and s/p right TCAR for symptomatic carotid artery stenosis with  central retinal artery occlusion on 06/12/2020 by Dr. Trula Slade.     Carotid stenosis -duplex today reveals *** -discussed s/s of stroke with pt and he understands should he develop any of these sx, he will go to the nearest ER or call 911. -pt will f/u in *** with carotid duplex -pt will call sooner should ***he have any issues. -continue statin/asa   Leg swelling with hx of BLE GSV ablations in 2019 by Dr. Oneida Alar -***   Leontine Locket, [facility] [phone]  Clinic MD:  Trula Slade

## 2022-08-15 ENCOUNTER — Ambulatory Visit (INDEPENDENT_AMBULATORY_CARE_PROVIDER_SITE_OTHER): Payer: Medicare Other | Admitting: Physician Assistant

## 2022-08-15 ENCOUNTER — Ambulatory Visit (HOSPITAL_COMMUNITY)
Admission: RE | Admit: 2022-08-15 | Discharge: 2022-08-15 | Disposition: A | Discharge status: Home / Self Care | Payer: Medicare Other | Source: Ambulatory Visit | Attending: Surgery | Admitting: Surgery

## 2022-08-15 VITALS — BP 147/90 | HR 61 | Temp 98.6°F | Resp 20 | Ht 71.0 in

## 2022-08-15 DIAGNOSIS — I6523 Occlusion and stenosis of bilateral carotid arteries: Secondary | ICD-10-CM

## 2022-08-15 DIAGNOSIS — M7989 Other specified soft tissue disorders: Secondary | ICD-10-CM

## 2022-08-16 DIAGNOSIS — I6522 Occlusion and stenosis of left carotid artery: Secondary | ICD-10-CM | POA: Diagnosis not present

## 2022-08-30 DIAGNOSIS — I25119 Atherosclerotic heart disease of native coronary artery with unspecified angina pectoris: Secondary | ICD-10-CM | POA: Diagnosis not present

## 2022-08-30 DIAGNOSIS — Z5320 Procedure and treatment not carried out because of patient's decision for unspecified reasons: Secondary | ICD-10-CM | POA: Diagnosis not present

## 2022-09-11 ENCOUNTER — Other Ambulatory Visit: Payer: Self-pay

## 2022-09-11 ENCOUNTER — Encounter (HOSPITAL_COMMUNITY): Payer: Self-pay

## 2022-09-11 ENCOUNTER — Emergency Department (HOSPITAL_COMMUNITY)
Admission: EM | Admit: 2022-09-11 | Discharge: 2022-09-12 | Disposition: A | Discharge status: Home / Self Care | Payer: Medicare Other | Attending: Emergency Medicine | Admitting: Emergency Medicine

## 2022-09-11 ENCOUNTER — Emergency Department (HOSPITAL_COMMUNITY): Payer: Medicare Other

## 2022-09-11 DIAGNOSIS — R609 Edema, unspecified: Secondary | ICD-10-CM | POA: Insufficient documentation

## 2022-09-11 DIAGNOSIS — Z7982 Long term (current) use of aspirin: Secondary | ICD-10-CM | POA: Diagnosis not present

## 2022-09-11 DIAGNOSIS — R0689 Other abnormalities of breathing: Secondary | ICD-10-CM | POA: Insufficient documentation

## 2022-09-11 DIAGNOSIS — Z7901 Long term (current) use of anticoagulants: Secondary | ICD-10-CM | POA: Insufficient documentation

## 2022-09-11 DIAGNOSIS — Z79899 Other long term (current) drug therapy: Secondary | ICD-10-CM | POA: Insufficient documentation

## 2022-09-11 DIAGNOSIS — R0902 Hypoxemia: Secondary | ICD-10-CM | POA: Diagnosis not present

## 2022-09-11 DIAGNOSIS — I959 Hypotension, unspecified: Secondary | ICD-10-CM | POA: Diagnosis not present

## 2022-09-11 DIAGNOSIS — R0789 Other chest pain: Secondary | ICD-10-CM | POA: Diagnosis not present

## 2022-09-11 DIAGNOSIS — R079 Chest pain, unspecified: Secondary | ICD-10-CM | POA: Diagnosis not present

## 2022-09-11 DIAGNOSIS — I4891 Unspecified atrial fibrillation: Secondary | ICD-10-CM | POA: Diagnosis not present

## 2022-09-11 LAB — COMPREHENSIVE METABOLIC PANEL
ALT: 20 U/L (ref 0–44)
AST: 21 U/L (ref 15–41)
Albumin: 2.7 g/dL — ABNORMAL LOW (ref 3.5–5.0)
Alkaline Phosphatase: 70 U/L (ref 38–126)
Anion gap: 8 (ref 5–15)
BUN: 15 mg/dL (ref 8–23)
CO2: 29 mmol/L (ref 22–32)
Calcium: 9.1 mg/dL (ref 8.9–10.3)
Chloride: 104 mmol/L (ref 98–111)
Creatinine, Ser: 1.14 mg/dL (ref 0.61–1.24)
GFR, Estimated: >60 mL/min (ref >60)
Glucose, Bld: 113 mg/dL — ABNORMAL HIGH (ref 70–99)
Potassium: 4.1 mmol/L (ref 3.5–5.1)
Sodium: 141 mmol/L (ref 135–145)
Total Bilirubin: 1.4 mg/dL — ABNORMAL HIGH (ref 0.3–1.2)
Total Protein: 5.3 g/dL — ABNORMAL LOW (ref 6.5–8.1)

## 2022-09-11 LAB — CBC WITH DIFFERENTIAL/PLATELET
Abs Immature Granulocytes: 0.03 10*3/uL (ref 0.00–0.07)
Basophils Absolute: 0 10*3/uL (ref 0.0–0.1)
Basophils Relative: 0 %
Eosinophils Absolute: 0.4 10*3/uL (ref 0.0–0.5)
Eosinophils Relative: 3 %
HCT: 49.1 % (ref 39.0–52.0)
Hemoglobin: 15.5 g/dL (ref 13.0–17.0)
Immature Granulocytes: 0 %
Lymphocytes Relative: 13 %
Lymphs Abs: 1.5 10*3/uL (ref 0.7–4.0)
MCH: 27.5 pg (ref 26.0–34.0)
MCHC: 31.6 g/dL (ref 30.0–36.0)
MCV: 87.2 fL (ref 80.0–100.0)
Monocytes Absolute: 0.9 10*3/uL (ref 0.1–1.0)
Monocytes Relative: 8 %
Neutro Abs: 8.5 10*3/uL — ABNORMAL HIGH (ref 1.7–7.7)
Neutrophils Relative %: 76 %
Platelets: 179 10*3/uL (ref 150–400)
RBC: 5.63 MIL/uL (ref 4.22–5.81)
RDW: 15 % (ref 11.5–15.5)
WBC: 11.3 10*3/uL — ABNORMAL HIGH (ref 4.0–10.5)
nRBC: 0 % (ref 0.0–0.2)

## 2022-09-11 LAB — TROPONIN I (HIGH SENSITIVITY): Troponin I (High Sensitivity): 39 ng/L — ABNORMAL HIGH (ref <18)

## 2022-09-11 MED ORDER — ALUM & MAG HYDROXIDE-SIMETH 200-200-20 MG/5ML PO SUSP
15.0000 mL | Freq: Once | ORAL | Status: AC
  Administered 2022-09-11: 15 mL via ORAL
  Filled 2022-09-11: qty 30

## 2022-09-11 NOTE — ED Triage Notes (Signed)
-  patient arrives via ems from [facility] secondary to chest pain that began 200 today. Patient received 0.4 mg nitro and 325 mg aspirin prior arrival. Patient denies n/v . Patient denies any pain at this time. Hx afib.

## 2022-09-11 NOTE — ED Provider Notes (Signed)
[facility] Provider Note   CSN: [id number] Arrival date & time: 09/11/22  2201     History {Add pertinent medical, surgical, social history, OB history to HPI:1} Chief Complaint  Patient presents with   Chest Pain    Reginald [name] is a 87 y.o. male.  87 year old male presents emergency room via EMS from [facility] rehab with complaint of chest pain.  Patient states that he has been having a midsternal chest discomfort intermittent for the past week, generally is given a Tums by staff with resolution of his pain.  Today, reported the pain however staff did not bring him Tums or offer any other pain relief.  Patient began to argue with the nurse and yelled for an ambulance.  Patient was provided with aspirin and nitro from EMS and arrives in the ER almost pain-free.  He is on a nasal cannula which she has for as needed use, states that he has not been feeling any more short of breath today, denies nausea, vomiting, diaphoresis.  Notes he has not had much to eat today as the person who brought his breakfast did not prepare his coffee, tea or open his breakfast items for him.  Past medical history significant for diabetes, obesity, hypertension, hyperlipidemia, COPD, OSA, MI (has 1 stent), CHF, permanent A-fib (is on Eliquis), chronic kidney disease, CVA.       Home Medications Prior to Admission medications   Medication Sig Start Date End Date Taking? Authorizing Provider  acetaminophen (TYLENOL) 325 MG tablet Take 650 mg by mouth every 4 (four) hours as needed for moderate pain (for pain).    [provider]  albuterol (VENTOLIN HFA) 108 (90 Base) MCG/ACT inhaler Inhale 2 puffs into the lungs 2 (two) times daily as needed for wheezing or shortness of breath. 07/03/21   Sharen Hones, MD  apixaban (ELIQUIS) 5 MG TABS tablet Take 1 tablet (5 mg total) by mouth 2 (two) times daily. 08/07/20   Richarda Osmond, MD  aspirin EC 81 MG EC  tablet Take 1 tablet (81 mg total) by mouth daily. Swallow whole. 06/13/20   Hosie Poisson, MD  atorvastatin (LIPITOR) 40 MG tablet Take 40 mg by mouth at bedtime.    [provider]  atropine 1 % ophthalmic solution Place 1 drop into the right eye daily. 06/09/22   [provider]  Cholecalciferol (VITAMIN D-3) 25 MCG (1000 UT) CAPS Take 2,000 Units by mouth daily.    [provider]  Dextran 70-Hypromellose (ARTIFICIAL TEARS PF OP) Place 1 drop into both eyes 4 (four) times daily.    [provider]  dorzolamide-timolol (COSOPT) 22.3-6.8 MG/ML ophthalmic solution Place 1 drop into both eyes 2 (two) times daily.    [provider]  Dulaglutide (TRULICITY) 1.5 0000000 SOPN Inject into the skin.    [provider]  erythromycin ophthalmic ointment Place into the right eye 2 times daily. 06/09/22   [provider]  gabapentin (NEURONTIN) 100 MG capsule Take 2 capsules (200 mg total) by mouth at bedtime as needed. 08/07/20   Richarda Osmond, MD  hydrALAZINE (APRESOLINE) 10 MG tablet Take 10 mg by mouth daily as needed (sbp >160).    [provider]  latanoprost (XALATAN) 0.005 % ophthalmic solution Place 1 drop into both eyes at bedtime. 01/03/17   [provider]  losartan (COZAAR) 50 MG tablet Take 50 mg by mouth daily.    [provider]  magnesium  oxide (MAG-OX) 400 MG tablet Take 400 mg by mouth daily.    [provider]  Melatonin 5 MG TABS Take 5 mg by mouth at bedtime.    [provider]  Melatonin-Pyridoxine 5-1 MG TABS Take by mouth.    [provider]  Menthol, Topical Analgesic, (BIOFREEZE) 4 % GEL Apply 1 application. topically in the morning and at bedtime. Apply to neck/shoulder    [provider]  metoprolol succinate (TOPROL XL) 25 MG 24 hr tablet Take 0.5 tablets (12.5 mg total) by mouth daily. 06/14/20 09/14/21  Hosie Poisson, MD  nitroGLYCERIN (NITROSTAT) 0.4  MG SL tablet Place 0.4 mg under the tongue every 5 (five) minutes x 3 doses as needed for chest pain (and CALL PROVIDER IF NO RELIEF AFTER THE 3RD DOSE).    [provider]  OXYGEN Inhale 2 L into the lungs at bedtime.    [provider]  polyethylene glycol (MIRALAX / GLYCOLAX) 17 g packet Take 17 g by mouth daily.    [provider]  prednisoLONE acetate (PRED FORTE) 1 % ophthalmic suspension Place 1 drop into the right eye 4 times daily. 06/09/22   [provider]  Semaglutide, 1 MG/DOSE, (OZEMPIC, 1 MG/DOSE,) 4 MG/3ML SOPN Inject 1 mg into the skin every Monday.    [provider]  sennosides-docusate sodium (SENOKOT-S) 8.6-50 MG tablet Take 2 tablets by mouth 2 (two) times daily.    [provider]  Skin Protectants, Misc. (EUCERIN) cream Apply 1 application. topically daily.    [provider]  tamsulosin (FLOMAX) 0.4 MG CAPS capsule Take 1 capsule (0.4 mg total) by mouth daily after supper. 12/25/18   Kayleen Memos, DO  VASCEPA 1 g capsule Take 1 g by mouth every evening.    [provider]  vitamin B-12 (CYANOCOBALAMIN) 1000 MCG tablet Take 500 mcg by mouth daily.    [provider]      Allergies    Adhesive [tape]    Review of Systems   Review of Systems Negative except as per HPI Physical Exam Updated Vital Signs BP (!) 156/76 (BP Location: Right Arm)   Pulse 80   Temp 98.2 F (36.8 C) (Oral)   Resp (!) 21   Ht '5\' 11"'$  (1.803 m)   Wt 124.2 kg   SpO2 96%   BMI 38.19 kg/m  Physical Exam Vitals and nursing note reviewed.  Constitutional:      General: He is not in acute distress.    Appearance: He is well-developed. He is obese. He is not diaphoretic.  HENT:     Head: Normocephalic and atraumatic.  Cardiovascular:     Rate and Rhythm: Normal rate and regular rhythm.     Heart sounds: Normal heart sounds.  Pulmonary:     Breath sounds: Examination of the right-lower field reveals decreased  breath sounds. Examination of the left-lower field reveals decreased breath sounds. Decreased breath sounds present.     Comments: Mildly increased respiratory effort, speaks in short sentences Chest:     Chest wall: No tenderness.  Abdominal:     Palpations: Abdomen is soft.     Tenderness: There is no abdominal tenderness.  Musculoskeletal:     Right lower leg: Edema present.     Left lower leg: Edema present.  Skin:    General: Skin is warm and dry.  Neurological:     Mental Status: He is alert and oriented to person, place, and time.  Psychiatric:  Behavior: Behavior normal.     ED Results / Procedures / Treatments   Labs (all labs ordered are listed, but only abnormal results are displayed) Labs Reviewed  CBC WITH DIFFERENTIAL/PLATELET - Abnormal; Notable for the following components:      Result Value   WBC 11.3 (*)    Neutro Abs 8.5 (*)    All other components within normal limits  COMPREHENSIVE METABOLIC PANEL  TROPONIN I (HIGH SENSITIVITY)    EKG None  Radiology DG Chest Port 1 View  Result Date: 09/11/2022 CLINICAL DATA:  Chest pain EXAM: PORTABLE CHEST 1 VIEW COMPARISON:  07/01/2021 FINDINGS: Cardiomegaly with mild perihilar edema and small left pleural effusion. No pneumothorax. IMPRESSION: Cardiomegaly with mild perihilar edema and small left pleural effusion. Electronically Signed   By: Julian Hy M.D.   On: 09/11/2022 22:24    Procedures Procedures  {Document cardiac monitor, telemetry assessment procedure when appropriate:1}  Medications Ordered in ED Medications - No data to display  ED Course/ Medical Decision Making/ A&P   {   Click here for ABCD2, HEART and other calculatorsREFRESH Note before signing :1}                          Medical Decision Making Amount and/or Complexity of Data Reviewed Labs: ordered. Radiology: ordered. ECG/medicine tests: ordered.   This patient presents to the ED for concern of chest pain, this  involves an extensive number of treatment options, and is a complaint that carries with it a high risk of complications and morbidity.  The differential diagnosis includes ACS, GERD, esophagitis, CHF   Co morbidities that complicate the patient evaluation  ***   Additional history obtained:  Additional history obtained from *** External records from outside source obtained and reviewed including ***   Lab Tests:  I Ordered, and personally interpreted labs.  The pertinent results include:  ***   Imaging Studies ordered:  I ordered imaging studies including ***  I independently visualized and interpreted imaging which showed *** I agree with the radiologist interpretation   Cardiac Monitoring: / EKG:  The patient was maintained on a cardiac monitor.  I personally viewed and interpreted the cardiac monitored which showed an underlying rhythm of: A-fib, rate 69   Consultations Obtained:  I requested consultation with the ***,  and discussed lab and imaging findings as well as pertinent plan - they recommend: ***   Problem List / ED Course / Critical interventions / Medication management  *** I ordered medication including ***  for ***  Reevaluation of the patient after these medicines showed that the patient {resolved/improved/worsened:23923::"improved"} I have reviewed the patients home medicines and have made adjustments as needed   Social Determinants of Health:  ***   Test / Admission - Considered:  ***   {Document critical care time when appropriate:1} {Document review of labs and clinical decision tools ie heart score, Chads2Vasc2 etc:1}  {Document your independent review of radiology images, and any outside records:1} {Document your discussion with family members, caretakers, and with consultants:1} {Document social determinants of health affecting pt's care:1} {Document your decision making why or why not admission, treatments were needed:1} Final  Clinical Impression(s) / ED Diagnoses Final diagnoses:  None    Rx / DC Orders ED Discharge Orders     None

## 2022-09-12 DIAGNOSIS — K219 Gastro-esophageal reflux disease without esophagitis: Secondary | ICD-10-CM | POA: Diagnosis not present

## 2022-09-12 DIAGNOSIS — R4589 Other symptoms and signs involving emotional state: Secondary | ICD-10-CM | POA: Diagnosis not present

## 2022-09-12 DIAGNOSIS — R0789 Other chest pain: Secondary | ICD-10-CM | POA: Diagnosis not present

## 2022-09-12 LAB — TROPONIN I (HIGH SENSITIVITY): Troponin I (High Sensitivity): 40 ng/L — ABNORMAL HIGH (ref <18)

## 2022-09-12 NOTE — ED Notes (Signed)
[facility] arrived for transport

## 2022-09-12 NOTE — ED Notes (Signed)
Called [facility] to transport patient back to [place] [facility]

## 2022-09-13 ENCOUNTER — Other Ambulatory Visit: Payer: Self-pay

## 2022-09-13 ENCOUNTER — Emergency Department (HOSPITAL_COMMUNITY): Payer: Medicare Other

## 2022-09-13 ENCOUNTER — Observation Stay (HOSPITAL_COMMUNITY)
Admission: EM | Admit: 2022-09-13 | Discharge: 2022-09-14 | Disposition: A | Discharge status: Skilled Nursing Facility | Payer: Medicare Other | Attending: Internal Medicine | Admitting: Internal Medicine

## 2022-09-13 ENCOUNTER — Encounter (HOSPITAL_COMMUNITY): Payer: Self-pay

## 2022-09-13 DIAGNOSIS — R0789 Other chest pain: Secondary | ICD-10-CM | POA: Diagnosis not present

## 2022-09-13 DIAGNOSIS — N184 Chronic kidney disease, stage 4 (severe): Secondary | ICD-10-CM | POA: Insufficient documentation

## 2022-09-13 DIAGNOSIS — E1122 Type 2 diabetes mellitus with diabetic chronic kidney disease: Secondary | ICD-10-CM | POA: Insufficient documentation

## 2022-09-13 DIAGNOSIS — I5033 Acute on chronic diastolic (congestive) heart failure: Secondary | ICD-10-CM | POA: Insufficient documentation

## 2022-09-13 DIAGNOSIS — Z79899 Other long term (current) drug therapy: Secondary | ICD-10-CM | POA: Insufficient documentation

## 2022-09-13 DIAGNOSIS — R58 Hemorrhage, not elsewhere classified: Secondary | ICD-10-CM | POA: Diagnosis not present

## 2022-09-13 DIAGNOSIS — Z7901 Long term (current) use of anticoagulants: Secondary | ICD-10-CM | POA: Diagnosis not present

## 2022-09-13 DIAGNOSIS — Z8673 Personal history of transient ischemic attack (TIA), and cerebral infarction without residual deficits: Secondary | ICD-10-CM | POA: Insufficient documentation

## 2022-09-13 DIAGNOSIS — J449 Chronic obstructive pulmonary disease, unspecified: Secondary | ICD-10-CM | POA: Diagnosis not present

## 2022-09-13 DIAGNOSIS — I130 Hypertensive heart and chronic kidney disease with heart failure and stage 1 through stage 4 chronic kidney disease, or unspecified chronic kidney disease: Secondary | ICD-10-CM | POA: Insufficient documentation

## 2022-09-13 DIAGNOSIS — I2510 Atherosclerotic heart disease of native coronary artery without angina pectoris: Secondary | ICD-10-CM | POA: Insufficient documentation

## 2022-09-13 DIAGNOSIS — I509 Heart failure, unspecified: Secondary | ICD-10-CM

## 2022-09-13 DIAGNOSIS — R079 Chest pain, unspecified: Secondary | ICD-10-CM | POA: Diagnosis not present

## 2022-09-13 DIAGNOSIS — K219 Gastro-esophageal reflux disease without esophagitis: Secondary | ICD-10-CM

## 2022-09-13 DIAGNOSIS — R000 Tachycardia, unspecified: Secondary | ICD-10-CM | POA: Diagnosis not present

## 2022-09-13 DIAGNOSIS — I480 Paroxysmal atrial fibrillation: Secondary | ICD-10-CM | POA: Insufficient documentation

## 2022-09-13 DIAGNOSIS — I4891 Unspecified atrial fibrillation: Secondary | ICD-10-CM | POA: Diagnosis not present

## 2022-09-13 DIAGNOSIS — Z7982 Long term (current) use of aspirin: Secondary | ICD-10-CM | POA: Insufficient documentation

## 2022-09-13 LAB — COMPREHENSIVE METABOLIC PANEL
ALT: 16 U/L (ref 0–44)
AST: 16 U/L (ref 15–41)
Albumin: 2.6 g/dL — ABNORMAL LOW (ref 3.5–5.0)
Alkaline Phosphatase: 68 U/L (ref 38–126)
Anion gap: 5 (ref 5–15)
BUN: 19 mg/dL (ref 8–23)
CO2: 28 mmol/L (ref 22–32)
Calcium: 8.9 mg/dL (ref 8.9–10.3)
Chloride: 107 mmol/L (ref 98–111)
Creatinine, Ser: 1.36 mg/dL — ABNORMAL HIGH (ref 0.61–1.24)
GFR, Estimated: 51 mL/min — ABNORMAL LOW (ref >60)
Glucose, Bld: 115 mg/dL — ABNORMAL HIGH (ref 70–99)
Potassium: 3.7 mmol/L (ref 3.5–5.1)
Sodium: 140 mmol/L (ref 135–145)
Total Bilirubin: 0.9 mg/dL (ref 0.3–1.2)
Total Protein: 5.1 g/dL — ABNORMAL LOW (ref 6.5–8.1)

## 2022-09-13 LAB — BRAIN NATRIURETIC PEPTIDE: B Natriuretic Peptide: 115.5 pg/mL — ABNORMAL HIGH (ref 0.0–100.0)

## 2022-09-13 LAB — CBC WITH DIFFERENTIAL/PLATELET
Abs Immature Granulocytes: 0.03 10*3/uL (ref 0.00–0.07)
Basophils Absolute: 0 10*3/uL (ref 0.0–0.1)
Basophils Relative: 0 %
Eosinophils Absolute: 0.3 10*3/uL (ref 0.0–0.5)
Eosinophils Relative: 3 %
HCT: 45.9 % (ref 39.0–52.0)
Hemoglobin: 14.5 g/dL (ref 13.0–17.0)
Immature Granulocytes: 0 %
Lymphocytes Relative: 18 %
Lymphs Abs: 1.7 10*3/uL (ref 0.7–4.0)
MCH: 27.7 pg (ref 26.0–34.0)
MCHC: 31.6 g/dL (ref 30.0–36.0)
MCV: 87.6 fL (ref 80.0–100.0)
Monocytes Absolute: 0.9 10*3/uL (ref 0.1–1.0)
Monocytes Relative: 9 %
Neutro Abs: 6.9 10*3/uL (ref 1.7–7.7)
Neutrophils Relative %: 70 %
Platelets: 165 10*3/uL (ref 150–400)
RBC: 5.24 MIL/uL (ref 4.22–5.81)
RDW: 15.1 % (ref 11.5–15.5)
WBC: 9.8 10*3/uL (ref 4.0–10.5)
nRBC: 0 % (ref 0.0–0.2)

## 2022-09-13 LAB — LIPASE, BLOOD: Lipase: 32 U/L (ref 11–51)

## 2022-09-13 LAB — TROPONIN I (HIGH SENSITIVITY)
Troponin I (High Sensitivity): 32 ng/L — ABNORMAL HIGH (ref <18)
Troponin I (High Sensitivity): 34 ng/L — ABNORMAL HIGH (ref <18)
Troponin I (High Sensitivity): 34 ng/L — ABNORMAL HIGH (ref <18)

## 2022-09-13 LAB — MRSA NEXT GEN BY PCR, NASAL: MRSA by PCR Next Gen: NOT DETECTED

## 2022-09-13 LAB — MAGNESIUM: Magnesium: 2.1 mg/dL (ref 1.7–2.4)

## 2022-09-13 LAB — PHOSPHORUS: Phosphorus: 2.9 mg/dL (ref 2.5–4.6)

## 2022-09-13 MED ORDER — IPRATROPIUM BROMIDE 0.02 % IN SOLN: 0.5000 mg | Freq: Four times a day (QID) | RESPIRATORY_TRACT | Status: DC | PRN

## 2022-09-13 MED ORDER — ALUM & MAG HYDROXIDE-SIMETH 200-200-20 MG/5ML PO SUSP
30.0000 mL | Freq: Once | ORAL | Status: AC
  Administered 2022-09-13: 30 mL via ORAL
  Filled 2022-09-13: qty 30

## 2022-09-13 MED ORDER — APIXABAN 5 MG PO TABS
5.0000 mg | ORAL_TABLET | Freq: Two times a day (BID) | ORAL | Status: DC
  Administered 2022-09-13 – 2022-09-14 (×3): 5 mg via ORAL
  Filled 2022-09-13 (×3): qty 1

## 2022-09-13 MED ORDER — PREDNISOLONE ACETATE 1 % OP SUSP
1.0000 [drp] | Freq: Two times a day (BID) | OPHTHALMIC | Status: DC
  Administered 2022-09-13 – 2022-09-14 (×3): 1 [drp] via OPHTHALMIC
  Filled 2022-09-13: qty 5

## 2022-09-13 MED ORDER — LATANOPROST 0.005 % OP SOLN
1.0000 [drp] | Freq: Every day | OPHTHALMIC | Status: DC
  Administered 2022-09-13: 1 [drp] via OPHTHALMIC
  Filled 2022-09-13: qty 2.5

## 2022-09-13 MED ORDER — MAGNESIUM OXIDE -MG SUPPLEMENT 400 (240 MG) MG PO TABS
400.0000 mg | ORAL_TABLET | Freq: Every day | ORAL | Status: DC
  Administered 2022-09-13 – 2022-09-14 (×2): 400 mg via ORAL
  Filled 2022-09-13 (×2): qty 1

## 2022-09-13 MED ORDER — TAMSULOSIN HCL 0.4 MG PO CAPS
0.4000 mg | ORAL_CAPSULE | Freq: Every day | ORAL | Status: DC
  Administered 2022-09-13: 0.4 mg via ORAL
  Filled 2022-09-13: qty 1

## 2022-09-13 MED ORDER — VITAMIN B-12 100 MCG PO TABS
500.0000 ug | ORAL_TABLET | Freq: Every day | ORAL | Status: DC
  Administered 2022-09-13 – 2022-09-14 (×2): 500 ug via ORAL
  Filled 2022-09-13 (×2): qty 5

## 2022-09-13 MED ORDER — ASPIRIN 81 MG PO TBEC: 81.0000 mg | DELAYED_RELEASE_TABLET | Freq: Every day | ORAL | Status: DC

## 2022-09-13 MED ORDER — MELATONIN 5 MG PO TABS
5.0000 mg | ORAL_TABLET | Freq: Every day | ORAL | Status: DC
  Administered 2022-09-13: 5 mg via ORAL
  Filled 2022-09-13: qty 1

## 2022-09-13 MED ORDER — ATROPINE SULFATE 1 % OP SOLN
1.0000 [drp] | Freq: Every day | OPHTHALMIC | Status: DC
  Administered 2022-09-13 – 2022-09-14 (×2): 1 [drp] via OPHTHALMIC
  Filled 2022-09-13: qty 2

## 2022-09-13 MED ORDER — ASPIRIN 81 MG PO TBEC
81.0000 mg | DELAYED_RELEASE_TABLET | Freq: Every day | ORAL | Status: DC
  Administered 2022-09-14: 81 mg via ORAL
  Filled 2022-09-13: qty 1

## 2022-09-13 MED ORDER — ATORVASTATIN CALCIUM 40 MG PO TABS
40.0000 mg | ORAL_TABLET | Freq: Every day | ORAL | Status: DC
  Administered 2022-09-13: 40 mg via ORAL
  Filled 2022-09-13: qty 1

## 2022-09-13 MED ORDER — VITAMIN D 25 MCG (1000 UNIT) PO TABS
2000.0000 [IU] | ORAL_TABLET | Freq: Every day | ORAL | Status: DC
  Administered 2022-09-13 – 2022-09-14 (×2): 2000 [IU] via ORAL
  Filled 2022-09-13 (×2): qty 2

## 2022-09-13 MED ORDER — GABAPENTIN 100 MG PO CAPS: 200.0000 mg | ORAL_CAPSULE | Freq: Every evening | ORAL | Status: DC | PRN

## 2022-09-13 MED ORDER — DORZOLAMIDE HCL-TIMOLOL MAL 2-0.5 % OP SOLN
1.0000 [drp] | Freq: Two times a day (BID) | OPHTHALMIC | Status: DC
  Administered 2022-09-13 – 2022-09-14 (×3): 1 [drp] via OPHTHALMIC
  Filled 2022-09-13: qty 10

## 2022-09-13 MED ORDER — LOSARTAN POTASSIUM 50 MG PO TABS
50.0000 mg | ORAL_TABLET | Freq: Every day | ORAL | Status: DC
  Administered 2022-09-13: 50 mg via ORAL
  Filled 2022-09-13: qty 1

## 2022-09-13 MED ORDER — PANTOPRAZOLE SODIUM 40 MG PO TBEC
40.0000 mg | DELAYED_RELEASE_TABLET | Freq: Every day | ORAL | Status: DC
  Administered 2022-09-13 – 2022-09-14 (×2): 40 mg via ORAL
  Filled 2022-09-13 (×2): qty 1

## 2022-09-13 MED ORDER — METOPROLOL SUCCINATE ER 25 MG PO TB24
25.0000 mg | ORAL_TABLET | Freq: Every day | ORAL | Status: DC
  Administered 2022-09-14: 25 mg via ORAL
  Filled 2022-09-13: qty 1

## 2022-09-13 MED ORDER — SENNOSIDES-DOCUSATE SODIUM 8.6-50 MG PO TABS
2.0000 | ORAL_TABLET | Freq: Two times a day (BID) | ORAL | Status: DC
  Administered 2022-09-13 – 2022-09-14 (×3): 2 via ORAL
  Filled 2022-09-13 (×3): qty 2

## 2022-09-13 MED ORDER — FUROSEMIDE 10 MG/ML IJ SOLN
20.0000 mg | Freq: Once | INTRAMUSCULAR | Status: AC
  Administered 2022-09-13: 20 mg via INTRAVENOUS
  Filled 2022-09-13: qty 2

## 2022-09-13 MED ORDER — NITROGLYCERIN 0.4 MG SL SUBL: 0.4000 mg | SUBLINGUAL_TABLET | SUBLINGUAL | Status: DC | PRN

## 2022-09-13 NOTE — Discharge Instructions (Addendum)
You may use a mild, regular strength 10 mL 3-4 times daily as needed for indigestion.

## 2022-09-13 NOTE — ED Notes (Signed)
Patient resting at this time. In no acute distress. Call light within reach, urinal within reach. VSS

## 2022-09-13 NOTE — Plan of Care (Signed)

## 2022-09-13 NOTE — ED Triage Notes (Signed)
BIB EMS FROM [facility] for chest pain, stated it started a couple hours ago. Describes as "pressure" 5/10 pain. 3 total nitro and 324 aspirin prior to arrival. Pt states no change in pain. 20g RFA.   Hx afib. 128/72; 93% 2L [place]; CBG 137; stroke (Left sided deficits)

## 2022-09-13 NOTE — H&P (Addendum)
Date: 09/13/2022               Patient Name:  LILA HRABE MRN: [id number]  DOB: 10/30/1935 Age / Sex: 87 y.o., male   PCP: [name], MD         Medical Service: Internal Medicine Teaching Service         Attending Physician: Dr. Charise Killian, MD      First Contact: Dr. Starlyn Skeans, MD Pager [phone]    Second Contact: Dr. Delene Ruffini, MD Pager [phone]         After Hours (After 5p/  First Contact Pager: [phone]  weekends / holidays): Second Contact Pager: [phone]   SUBJECTIVE   Chief Complaint: Chest pain  History of Present Illness:   Mr. Soares is an 87 y/o person living with a history of chronic hypoxia on 2L, COPD, CVA with left sided residual deficits,CAD s/p PCI, paroxysmal atrial fibrillation CKD stg 4, T2DM, HLD, HTN, and glaucoma who presents with left sided chest pressure. The chest pressure has been intermittent for the last 2 days. He was sent to the ED by his facility two days ago. During that evaluation, his troponins were elevated but flat and he was without EKG changes concerning for ACS. His symptoms improved with maalox and he was discharged home. These symptoms occurred again yesterday evening and his facility gave him two nitroglycerins and aspirin with no relief of his pain and sent him to the ED.   He describes this as a chest pressure, like someone is standing on his chest. It is different feeling than when he had his prior MI. The pressure has improved with belching and medical intervention of tums, maalox. When asked again if the aspirin and nitroglycerin improved his chest pain he mentions it helped "a little." He denies any radiation of the pain  He denies associated symptoms of fever, chills, diaphoresis, nausea, or vomiting. Denies low back pain or lower extremity swelling. Denies being sick recently. He has had no change in his chronic oxygen requirement.   Meds:  Current Meds  Medication Sig   apixaban (ELIQUIS) 5 MG TABS tablet Take 1  tablet (5 mg total) by mouth 2 (two) times daily.   aspirin EC 81 MG EC tablet Take 1 tablet (81 mg total) by mouth daily. Swallow whole.   atorvastatin (LIPITOR) 40 MG tablet Take 40 mg by mouth at bedtime.   atropine 1 % ophthalmic solution Place 1 drop into the right eye daily.   Cholecalciferol (VITAMIN D-3) 25 MCG (1000 UT) CAPS Take 2,000 Units by mouth daily.   Dextran 70-Hypromellose (ARTIFICIAL TEARS PF OP) Place 1 drop into both eyes 4 (four) times daily.   dorzolamide-timolol (COSOPT) 22.3-6.8 MG/ML ophthalmic solution Place 1 drop into both eyes 2 (two) times daily.   Dulaglutide (TRULICITY) 1.5 0000000 SOPN Inject 3 mg into the skin once a week. monday   gabapentin (NEURONTIN) 100 MG capsule Take 2 capsules (200 mg total) by mouth at bedtime as needed.   ipratropium (ATROVENT) 0.02 % nebulizer solution Take 0.5 mg by nebulization every 6 (six) hours as needed for wheezing or shortness of breath.   latanoprost (XALATAN) 0.005 % ophthalmic solution Place 1 drop into the left eye at bedtime.   losartan (COZAAR) 50 MG tablet Take 50 mg by mouth in the morning and at bedtime.   magnesium oxide (MAG-OX) 400 MG tablet Take 400 mg by mouth daily.   Melatonin 5 MG TABS Take 5  mg by mouth at bedtime.   Menthol, Topical Analgesic, (BIOFREEZE) 4 % GEL Apply 1 application. topically in the morning and at bedtime. Apply to neck/shoulder   metoprolol succinate (TOPROL XL) 25 MG 24 hr tablet Take 0.5 tablets (12.5 mg total) by mouth daily. (Patient taking differently: Take 25 mg by mouth daily.)   nitroGLYCERIN (NITROSTAT) 0.4 MG SL tablet Place 0.4 mg under the tongue every 5 (five) minutes x 3 doses as needed for chest pain (and CALL PROVIDER IF NO RELIEF AFTER THE 3RD DOSE).   polyethylene glycol (MIRALAX / GLYCOLAX) 17 g packet Take 17 g by mouth daily.   prednisoLONE acetate (PRED FORTE) 1 % ophthalmic suspension Place 1 drop into the right eye 2 (two) times daily.   sennosides-docusate sodium  (SENOKOT-S) 8.6-50 MG tablet Take 2 tablets by mouth 2 (two) times daily.   tamsulosin (FLOMAX) 0.4 MG CAPS capsule Take 1 capsule (0.4 mg total) by mouth daily after supper.   VASCEPA 1 g capsule Take 1 g by mouth every evening.   vitamin B-12 (CYANOCOBALAMIN) 1000 MCG tablet Take 500 mcg by mouth daily.    Past Medical History CVA with left sided residual deficits CAD s/p PCI Paroxysmal atrial fibrillation HLD HTN Chronic hypoxic respiratory failure COPD CKD stg 4 T2DM  Past Surgical History:  Procedure Laterality Date   APPENDECTOMY  1985   CARDIAC CATHETERIZATION     CHOLECYSTECTOMY  06/2000   CIRCUMCISION N/A 05/28/2020   Procedure: CIRCUMCISION ADULT;  Surgeon: Franchot Gallo, MD;  Location: [facility];  Service: Urology;  Laterality: N/A;   CORONARY ANGIOPLASTY  08/14/1995   stent placement to LAD    DORSAL SLIT N/A 05/28/2020   Procedure: DORSAL SLIT;  Surgeon: Franchot Gallo, MD;  Location: [facility];  Service: Urology;  Laterality: N/A;  45 MINS   ENDOVENOUS ABLATION SAPHENOUS VEIN W/ LASER Left 02/21/2018   endovenous laser ablation L GSV by Ruta Hinds MD    INGUINAL HERNIA REPAIR Right 1985   KNEE ARTHROSCOPY Left 1991   LUMBAR FUSION     NOSE SURGERY  1970s   Per Dr. Terrence Dupont [facility] in pt chart   TRANSCAROTID ARTERY REVASCULARIZATION  Right 06/12/2020   Procedure: RIGHT TRANSCAROTID ARTERY REVASCULARIZATION;  Surgeon: Serafina Mitchell, MD;  Location: [facility] OR;  Service: Vascular;  Laterality: Right;    Social:  Lives at [facility] Occupation: Retired, Worked in [employer] prior to his CVA Support: Family in the area, son [name] is his HCPOA Level of Function: fully dependent on others to assist with ADL/iADL PCP: Dr. Jules Husbands Substances: Denies tobacco, alcohol, or other substance use  Family History:  Father - Diabetes  Mother - Hypoxia, unsure of cause Sister - Diabetes  Allergies: Allergies as of 09/13/2022 - Review Complete 09/13/2022  Allergen  Reaction Noted   Adhesive [tape] Rash 08/11/2016    Review of Systems: A complete ROS was negative except as per HPI.   OBJECTIVE:   Physical Exam: Blood pressure (!) 176/87, pulse 62, temperature 98.7 F (37.1 C), temperature source Oral, resp. rate 19, height '5\' 11"'$  (1.803 m), weight 119.3 kg, SpO2 98 %.  Constitutional: chronically ill appearing, no acute distress HENT: normocephalic atraumatic, mucous membranes dry.  Eyes: conjunctiva non-erythematous Neck: supple Cardiovascular: irregular rhythm with 1/6 systolic murmur best heart over second right intercostal space Pulmonary/Chest: normal work of breathing on 2L [place], lungs clear to auscultation bilaterally Abdominal: soft, non-tender, non-distended MSK: normal bulk and tone. Reproducible chest pain Neurological: alert &  oriented x 3 Skin: warm and dry Psych: pleasant  Labs: CBC    Component Value Date/Time   WBC 9.8 09/13/2022 0132   RBC 5.24 09/13/2022 0132   HGB 14.5 09/13/2022 0132   HCT 45.9 09/13/2022 0132   PLT 165 09/13/2022 0132   MCV 87.6 09/13/2022 0132   MCH 27.7 09/13/2022 0132   MCHC 31.6 09/13/2022 0132   RDW 15.1 09/13/2022 0132   LYMPHSABS 1.7 09/13/2022 0132   MONOABS 0.9 09/13/2022 0132   EOSABS 0.3 09/13/2022 0132   BASOSABS 0.0 09/13/2022 0132     CMP     Component Value Date/Time   NA 140 09/13/2022 0132   K 3.7 09/13/2022 0132   CL 107 09/13/2022 0132   CO2 28 09/13/2022 0132   GLUCOSE 115 (H) 09/13/2022 0132   BUN 19 09/13/2022 0132   CREATININE 1.36 (H) 09/13/2022 0132   CALCIUM 8.9 09/13/2022 0132   PROT 5.1 (L) 09/13/2022 0132   ALBUMIN 2.6 (L) 09/13/2022 0132   AST 16 09/13/2022 0132   ALT 16 09/13/2022 0132   ALKPHOS 68 09/13/2022 0132   BILITOT 0.9 09/13/2022 0132   GFRNONAA 51 (L) 09/13/2022 0132   GFRAA 34 (L) 12/25/2018 [id number]    Imaging:  Chest xray - No focal opacity seen. Poor inspiration but do not believe effusions present. Bilateral vascular congestion,  unchanged from prior 2 days ago.   EKG: personally reviewed my interpretation is irregular rhythm with a rate of 70 bpm. Normal axis. Non-specific ST wave changes in V5/V6. Compared to prior from 06/2021, no significant change or concern for ACS.  ASSESSMENT & PLAN:   Assessment & Plan by Problem: Principal Problem:   Chest pain  YACOV TAPLEY is a 87 y.o. person living with a history of chronic hypoxia on 2L, COPD, CVA with left sided residual deficits,CAD s/p PCI, HFpEF, paroxysmal atrial fibrillation CKD stg 4, T2DM, HLD, HTN, and glaucoma who presented with chest pressure and admitted for further chest pain evaluation on hospital day 0  Atypical chest pain CAD s/p PCI to LAD PMHx of CAD with remote history of myocardial infarction s/p PCI to LAD in 1997. Presents with non-radiating chest pressure for the past two days that has improved with maalox, tums as well as nitroglycerin and aspirin. None of these therapies have taken away the pain completely. He denies associated symptoms of diaphoresis, nausea or vomiting and it is different han his prior chest pain during his MI. His three troponin's today have been low and flat. Mixed clinical picture, differential includes GERD, cardiac etiology with CAD. Some symptoms concerning for CAD with improvement of chest pressure with aspirin and nitroglycerin. While he also has improvement with maalox and tums making GERD or GI etiology more likely. Do not suspect ACS or need for emergent cardiology consult with low/flat troponins and non-significant changes in his EKG. Will admit to observation for echocardiogram to make certain no structural changes and further cardiac monitoring.  -continue aspirin 81 mg, metoprolol 25 mg, and atorvastatin 40 mg daily -echocardiogram pending, if any structural changes will plan to consult cardiology -nitroglycerin PRN  -cardiac monitoring -protonix daily  Hx of HFpEF Most recent echocardiogram 2022 with EF 50-55%  and inability to assess diastolic function. Does not appear grossly volume overloaded on exam, though I evaluated after ED gave IV lasix. BNP mildly elevated at 115. Not on diuretic therapy, assuming not on SGLT-2i with CKD 4. -repeat echocardiogram pending  Hx of CVA Right ICA stenosis  History of multiple infarcts, first in 2021 with right central retinal artery occlusion and punctate right paracentral gyrus infarcts. Also found to have right ICA with 70% stenosis. He presented again in 2022 with left hemiparesis and a right PLIC infarct. Since, he has required full assistance with his hemiparesis and has been at [facility]. He has continued on aspirin, eliquis, and atorvastatin daily. Denies new deficits or weakness.  -continue aspirin, atorvastatin and eliquis.  Chronic hypoxic respiratory failure COPD On home 2L in no respiratory distress or requiring additional oxygen supplementation. Do not suspect PE without tachycardia and while on eliquis daily -continue home O2 therapy -continue ipratropium PRN for dyspnea/wheezing -no maintenance inhaler in medication list  Paroxysmal atrial fibrillation Rate controlled and adherent to rate control therapy with metoprolol and eliquis 5 mg BID -continue metoprolol succinate 25 mg and eliquis 5 mg BID  CKD stg 4 Per [facility] documents, CKD 4 without baseline GFR/Cr listed. Over last year GFR ranged from 37-56. GFR today of 51. Electrolytes stable. -continue to monitor -renally dose medications  Type 2 Diabetes Home regimen of trulicity 3 mg weekly.  -ssi during admission  OSA He does not wear his cpap at [facility] because it doesn't fit right. Will need outpatient follow up to refit mask -cPAP QHS during hospitalization  Hx of glaucoma and right visual deficits -continue home eye drops  Diet: Normal VTE: Eliquis IVF: None,None Code: DNR, patient presented with most form signed 2023 listing him as a DNR with limited  medical interventions including IV fluids and antibiotics. No feeding tube. This is different from most form on file from 2021.  Prior to Admission Living Arrangement:  [facility] Anticipated Discharge Location:  [facility] Barriers to Discharge: further evaluation of chest pain  Dispo: Admit patient to Observation with expected length of stay less than 2 midnights.  Signed: Riesa Pope, MD Internal Medicine Resident PGY-3  09/13/2022, 1:20 PM

## 2022-09-13 NOTE — ED Provider Notes (Signed)
[facility] Provider Note   CSN: [id number] Arrival date & time: 09/13/22  0050     History  Chief Complaint  Patient presents with   Chest Pain    Reginald [name] is a 87 y.o. male.  The history is provided by the patient, medical records and the EMS personnel.  Chest Pain Reginald [name] is a 87 y.o. male who presents to the Emergency Department complaining of chest pain.  He presents to the emergency department for evaluation of left-sided and central chest pain that started last night.  He describes it as a constant pressure sensation.  No associated fevers, shortness of breath, nausea, vomiting, abdominal pain, diarrhea.  He states that he was seen in the emergency department yesterday but the nursing home and lost his discharge papers and did not know what to do if he had recurrent chest pain.  He did receive aspirin and 2 nitroglycerin by nursing home staff with no significant change in his pain.  He states the liquid he received yesterday in the emergency department is what seems to really work for his pain.    Home Medications Prior to Admission medications   Medication Sig Start Date End Date Taking? Authorizing Provider  acetaminophen (TYLENOL) 325 MG tablet Take 650 mg by mouth every 4 (four) hours as needed for moderate pain (for pain).    [provider]  albuterol (VENTOLIN HFA) 108 (90 Base) MCG/ACT inhaler Inhale 2 puffs into the lungs 2 (two) times daily as needed for wheezing or shortness of breath. 07/03/21   Sharen Hones, MD  apixaban (ELIQUIS) 5 MG TABS tablet Take 1 tablet (5 mg total) by mouth 2 (two) times daily. 08/07/20   Richarda Osmond, MD  aspirin EC 81 MG EC tablet Take 1 tablet (81 mg total) by mouth daily. Swallow whole. 06/13/20   Hosie Poisson, MD  atorvastatin (LIPITOR) 40 MG tablet Take 40 mg by mouth at bedtime.    [provider]  atropine 1 % ophthalmic solution Place 1 drop into  the right eye daily. 06/09/22   [provider]  Cholecalciferol (VITAMIN D-3) 25 MCG (1000 UT) CAPS Take 2,000 Units by mouth daily.    [provider]  Dextran 70-Hypromellose (ARTIFICIAL TEARS PF OP) Place 1 drop into both eyes 4 (four) times daily.    [provider]  dorzolamide-timolol (COSOPT) 22.3-6.8 MG/ML ophthalmic solution Place 1 drop into both eyes 2 (two) times daily.    [provider]  Dulaglutide (TRULICITY) 1.5 0000000 SOPN Inject into the skin.    [provider]  erythromycin ophthalmic ointment Place into the right eye 2 times daily. 06/09/22   [provider]  gabapentin (NEURONTIN) 100 MG capsule Take 2 capsules (200 mg total) by mouth at bedtime as needed. 08/07/20   Richarda Osmond, MD  hydrALAZINE (APRESOLINE) 10 MG tablet Take 10 mg by mouth daily as needed (sbp >160).    [provider]  latanoprost (XALATAN) 0.005 % ophthalmic solution Place 1 drop into both eyes at bedtime. 01/03/17   [provider]  losartan (COZAAR) 50 MG tablet Take 50 mg by mouth daily.    [provider]  magnesium oxide (MAG-OX) 400 MG tablet Take 400 mg by mouth daily.    [provider]  Melatonin 5 MG TABS Take 5 mg by mouth at bedtime.    [provider]  Melatonin-Pyridoxine 5-1 MG TABS Take by mouth.  [provider]  Menthol, Topical Analgesic, (BIOFREEZE) 4 % GEL Apply 1 application. topically in the morning and at bedtime. Apply to neck/shoulder    [provider]  metoprolol succinate (TOPROL XL) 25 MG 24 hr tablet Take 0.5 tablets (12.5 mg total) by mouth daily. 06/14/20 09/14/21  Hosie Poisson, MD  nitroGLYCERIN (NITROSTAT) 0.4 MG SL tablet Place 0.4 mg under the tongue every 5 (five) minutes x 3 doses as needed for chest pain (and CALL PROVIDER IF NO RELIEF AFTER THE 3RD DOSE).    [provider]  OXYGEN Inhale 2 L into the lungs at bedtime.    [provider]  polyethylene glycol (MIRALAX / GLYCOLAX) 17 g packet Take 17 g by mouth daily.    [provider]  prednisoLONE acetate (PRED FORTE) 1 % ophthalmic suspension Place 1 drop into the right eye 4 times daily. 06/09/22   [provider]  Semaglutide, 1 MG/DOSE, (OZEMPIC, 1 MG/DOSE,) 4 MG/3ML SOPN Inject 1 mg into the skin every Monday.    [provider]  sennosides-docusate sodium (SENOKOT-S) 8.6-50 MG tablet Take 2 tablets by mouth 2 (two) times daily.    [provider]  Skin Protectants, Misc. (EUCERIN) cream Apply 1 application. topically daily.    [provider]  tamsulosin (FLOMAX) 0.4 MG CAPS capsule Take 1 capsule (0.4 mg total) by mouth daily after supper. 12/25/18   Kayleen Memos, DO  VASCEPA 1 g capsule Take 1 g by mouth every evening.    [provider]  vitamin B-12 (CYANOCOBALAMIN) 1000 MCG tablet Take 500 mcg by mouth daily.    [provider]      Allergies    Adhesive [tape]    Review of Systems   Review of Systems  Cardiovascular:  Positive for chest pain.  All other systems reviewed and are negative.   Physical Exam Updated Vital Signs BP (!) 176/87   Pulse 62   Temp 98.7 F (37.1 C) (Oral)   Resp 19   Ht '5\' 11"'$  (1.803 m)   Wt 119.3 kg   SpO2 98%   BMI 36.68 kg/m  Physical Exam Vitals and nursing note reviewed.  Constitutional:      Appearance: He is well-developed.  HENT:     Head: Normocephalic and atraumatic.  Cardiovascular:     Rate and Rhythm: Normal rate and regular rhythm.     Heart sounds: No murmur heard. Pulmonary:     Effort: Pulmonary effort is normal. No respiratory distress.     Breath sounds: Normal breath sounds.  Abdominal:     Palpations: Abdomen is soft.     Tenderness: There is no guarding or rebound.     Comments: Mild generalized abdominal tenderness  Musculoskeletal:        General: No tenderness.     Comments: There is pitting edema to bilateral  lower extremities, right greater than left.  Skin:    General: Skin is warm and dry.  Neurological:     Mental Status: He is alert.     Comments: Awake and alert  Psychiatric:        Behavior: Behavior normal.     ED Results / Procedures / Treatments   Labs (all labs ordered are listed, but only abnormal results are displayed) Labs Reviewed  COMPREHENSIVE METABOLIC PANEL - Abnormal; Notable for the following components:      Result Value   Glucose, Bld 115 (*)    Creatinine, Ser 1.36 (*)  Total Protein 5.1 (*)    Albumin 2.6 (*)    GFR, Estimated 51 (*)    All other components within normal limits  BRAIN NATRIURETIC PEPTIDE - Abnormal; Notable for the following components:   B Natriuretic Peptide 115.5 (*)    All other components within normal limits  TROPONIN I (HIGH SENSITIVITY) - Abnormal; Notable for the following components:   Troponin I (High Sensitivity) 32 (*)    All other components within normal limits  TROPONIN I (HIGH SENSITIVITY) - Abnormal; Notable for the following components:   Troponin I (High Sensitivity) 34 (*)    All other components within normal limits  CBC WITH DIFFERENTIAL/PLATELET  LIPASE, BLOOD    EKG EKG Interpretation  Date/Time:  Tuesday September 13 2022 01:07:49 EST Ventricular Rate:  69 PR Interval:    QRS Duration: 98 QT Interval:  439 QTC Calculation: 471 R Axis:   50 Text Interpretation: Atrial fibrillation Anterior infarct, old Confirmed by Quintella Reichert [phone]) on 09/13/2022 1:21:11 AM  Radiology DG Chest Port 1 View  Result Date: 09/13/2022 CLINICAL DATA:  Chest pain EXAM: PORTABLE CHEST 1 VIEW COMPARISON:  09/11/2022 FINDINGS: Cardiomegaly, vascular congestion. Interstitial prominence could reflect interstitial edema. No effusions or acute bony abnormality. IMPRESSION: Cardiomegaly with vascular congestion and possible early interstitial edema. Electronically Signed   By: Rolm Baptise M.D.   On: 09/13/2022 01:22   DG Chest Port  1 View  Result Date: 09/11/2022 CLINICAL DATA:  Chest pain EXAM: PORTABLE CHEST 1 VIEW COMPARISON:  07/01/2021 FINDINGS: Cardiomegaly with mild perihilar edema and small left pleural effusion. No pneumothorax. IMPRESSION: Cardiomegaly with mild perihilar edema and small left pleural effusion. Electronically Signed   By: Julian Hy M.D.   On: 09/11/2022 22:24    Procedures Procedures    Medications Ordered in ED Medications  alum & mag hydroxide-simeth (MAALOX/MYLANTA) 200-200-20 MG/5ML suspension 30 mL (30 mLs Oral Given 09/13/22 0133)  furosemide (LASIX) injection 20 mg (20 mg Intravenous Given 09/13/22 [id number])    ED Course/ Medical Decision Making/ A&P                             Medical Decision Making Amount and/or Complexity of Data Reviewed Labs: ordered. Radiology: ordered.  Risk OTC drugs. Prescription drug management.  Patient with history of diabetes, permanent atrial fibrillation, CHF, coronary artery disease, stage III CKD, prior CVA, hypertension here for evaluation of chest pain.  Patient has a very difficult time describing the pain but specifically requested the drink he received the day previously for chest pain.  He received Maalox in the emergency department with resolution of his pain.  EKG is without acute ischemic changes and troponins are mildly elevated but flat x 2.  They are stable compared to yesterday's studies.  He does have a mildly elevated BNP, similar when compared to prior in 2018.  Chest x-ray with possible pulmonary vascular congestion patient appears volume up on evaluation with bilateral lower extremity edema as well as left upper extremity edema.  Last echo was from 2022 with EF of 50 to 55%.  He is not currently on a diuretic.  Will treat with Lasix for diuresis.  Given patient's recurrent ED visits over multiple days, inconsistent description of chest pain as well as increase in volume status medicine consulted for  observation.         Final Clinical Impression(s) / ED Diagnoses Final diagnoses:  Atypical chest pain  Acute  on chronic congestive heart failure, unspecified heart failure type [facility])    Rx / DC Orders ED Discharge Orders     None         Quintella Reichert, MD 09/13/22 [phone]

## 2022-09-13 NOTE — ED Notes (Signed)
ED TO INPATIENT HANDOFF REPORT  ED Nurse Name and Phone #: cori [phone]  S Name/Age/Gender Reginald [name] 87 y.o. male Room/Bed: 038C/038C  Code Status   Code Status: DNR  Home/SNF/Other Skilled nursing facility Patient oriented to: self, place, time, and situation Is this baseline? Yes   Triage Complete: Triage complete  Chief Complaint Chest pain [R07.9]  Triage Note BIB EMS FROM [facility] for chest pain, stated it started a couple hours ago. Describes as "pressure" 5/10 pain. 3 total nitro and 324 aspirin prior to arrival. Pt states no change in pain. 20g RFA.   Hx afib. 128/72; 93% 2L [place]; CBG 137; stroke (Left sided deficits)   Allergies Allergies  Allergen Reactions   Adhesive [Tape] Rash    Level of Care/Admitting Diagnosis ED Disposition     ED Disposition  Admit   Condition  --   Comment  Hospital Area: [place] [[id number]]  Level of Care: Telemetry Cardiac [103]  May place patient in observation at [facility] or [place] if equivalent level of care is available:: No  Covid Evaluation: Asymptomatic - no recent exposure (last 10 days) testing not required  Diagnosis: Chest pain [id number]  Admitting Physician: Charise Killian [id number]  Attending Physician: Charise Killian [id number]          B Medical/Surgery History Past Medical History:  Diagnosis Date   Anteroseptal myocardial infarction ([place])    Carotid artery occlusion    CHF (congestive heart failure) ([place])    Chronic kidney disease    COPD (chronic obstructive pulmonary disease) ([place])    Coronary atherosclerosis of unspecified type of vessel, native or graft    a. s/p prior PCI to LAD in 1997   Diverticulosis    ED (erectile dysfunction)    Foley catheter in place    Glaucoma    Hematuria    History of 2019 novel coronavirus disease (COVID-19) 11/2018   Hydronephrosis, bilateral    Hydroureter    Hypercholesteremia    Hypertensive renal disease    Hypogonadism male     Hypokalemia    Obesity hypoventilation syndrome (HCC)    Obesity, unspecified    OSA (obstructive sleep apnea)    Peripheral vascular disease ([place])    Permanent atrial fibrillation ([place])    Phimosis    Pneumonia    Rhabdomyolysis    Stroke ([place])    Tremor    Type II or unspecified type diabetes mellitus without mention of complication, not stated as uncontrolled    Unspecified essential hypertension    Urinary incontinence    Urinary retention    Vestibulopathy    Past Surgical History:  Procedure Laterality Date   [place]  06/2000   CIRCUMCISION N/A 05/28/2020   Procedure: CIRCUMCISION ADULT;  Surgeon: Franchot Gallo, MD;  Location: [facility];  Service: Urology;  Laterality: N/A;   CORONARY ANGIOPLASTY  08/14/1995   stent placement to LAD    DORSAL SLIT N/A 05/28/2020   Procedure: DORSAL SLIT;  Surgeon: Franchot Gallo, MD;  Location: [facility];  Service: Urology;  Laterality: N/A;  45 MINS   ENDOVENOUS ABLATION SAPHENOUS VEIN W/ LASER Left 02/21/2018   endovenous laser ablation L GSV by Ruta Hinds MD    INGUINAL HERNIA REPAIR Right 1985   KNEE ARTHROSCOPY Left 1991   LUMBAR FUSION     NOSE SURGERY  1970s   Per Dr. Terrence Dupont [facility] in pt chart  TRANSCAROTID ARTERY REVASCULARIZATION  Right 06/12/2020   Procedure: RIGHT TRANSCAROTID ARTERY REVASCULARIZATION;  Surgeon: Serafina Mitchell, MD;  Location: [facility] OR;  Service: Vascular;  Laterality: Right;     A IV Location/Drains/Wounds Patient Lines/Drains/Airways Status     Active Line/Drains/Airways     Name Placement date Placement time Site Days   Peripheral IV 09/13/22 20 G Distal;Posterior;Right Forearm 09/13/22  0705  Forearm  less than 1            Intake/Output Last 24 hours  Intake/Output Summary (Last 24 hours) at 09/13/2022 1222 Last data filed at 09/13/2022 [id number] Gross per 24 hour  Intake --  Output 750 ml  Net -750 ml    Labs/Imaging Results  for orders placed or performed during the hospital encounter of 09/13/22 (from the past 48 hour(s))  Comprehensive metabolic panel     Status: Abnormal   Collection Time: 09/13/22  1:32 AM  Result Value Ref Range   Sodium 140 135 - 145 mmol/L   Potassium 3.7 3.5 - 5.1 mmol/L   Chloride 107 98 - 111 mmol/L   CO2 28 22 - 32 mmol/L   Glucose, Bld 115 (H) 70 - 99 mg/dL    Comment: Glucose reference range applies only to samples taken after fasting for at least 8 hours.   BUN 19 8 - 23 mg/dL   Creatinine, Ser 1.36 (H) 0.61 - 1.24 mg/dL   Calcium 8.9 8.9 - 10.3 mg/dL   Total Protein 5.1 (L) 6.5 - 8.1 g/dL   Albumin 2.6 (L) 3.5 - 5.0 g/dL   AST 16 15 - 41 U/L   ALT 16 0 - 44 U/L   Alkaline Phosphatase 68 38 - 126 U/L   Total Bilirubin 0.9 0.3 - 1.2 mg/dL   GFR, Estimated 51 (L) >60 mL/min    Comment: (NOTE) Calculated using the CKD-EPI Creatinine Equation (2021)    Anion gap 5 5 - 15    Comment: Performed at [facility], [place] [address], [place], [place] [address]  Troponin I (High Sensitivity)     Status: Abnormal   Collection Time: 09/13/22  1:32 AM  Result Value Ref Range   Troponin I (High Sensitivity) 32 (H) <18 ng/L    Comment: (NOTE) Elevated high sensitivity troponin I (hsTnI) values and significant  changes across serial measurements may suggest ACS but many other  chronic and acute conditions are known to elevate hsTnI results.  Refer to the "Links" section for chest pain algorithms and additional  guidance. Performed at [facility], [place] [address], [place], [place] [address]   CBC with Differential     Status: None   Collection Time: 09/13/22  1:32 AM  Result Value Ref Range   WBC 9.8 4.0 - 10.5 K/uL   RBC 5.24 4.22 - 5.81 MIL/uL   Hemoglobin 14.5 13.0 - 17.0 g/dL   HCT 45.9 39.0 - 52.0 %   MCV 87.6 80.0 - 100.0 fL   MCH 27.7 26.0 - 34.0 pg   MCHC 31.6 30.0 - 36.0 g/dL   RDW 15.1 11.5 - 15.5 %   Platelets 165 150 - 400 K/uL   nRBC 0.0 0.0 - 0.2 %    Neutrophils Relative % 70 %   Neutro Abs 6.9 1.7 - 7.7 K/uL   Lymphocytes Relative 18 %   Lymphs Abs 1.7 0.7 - 4.0 K/uL   Monocytes Relative 9 %   Monocytes Absolute 0.9 0.1 - 1.0 K/uL  Eosinophils Relative 3 %   Eosinophils Absolute 0.3 0.0 - 0.5 K/uL   Basophils Relative 0 %   Basophils Absolute 0.0 0.0 - 0.1 K/uL   Immature Granulocytes 0 %   Abs Immature Granulocytes 0.03 0.00 - 0.07 K/uL    Comment: Performed at [facility], [place] [address], [place], [place] [address]  Lipase, blood     Status: None   Collection Time: 09/13/22  1:32 AM  Result Value Ref Range   Lipase 32 11 - 51 U/L    Comment: Performed at [place] [address], [place], [place] [address]  Troponin I (High Sensitivity)     Status: Abnormal   Collection Time: 09/13/22  2:51 AM  Result Value Ref Range   Troponin I (High Sensitivity) 34 (H) <18 ng/L    Comment: (NOTE) Elevated high sensitivity troponin I (hsTnI) values and significant  changes across serial measurements may suggest ACS but many other  chronic and acute conditions are known to elevate hsTnI results.  Refer to the "Links" section for chest pain algorithms and additional  guidance. Performed at [facility], [place] [address] [place], [place] [address]   Brain natriuretic peptide     Status: Abnormal   Collection Time: 09/13/22  2:51 AM  Result Value Ref Range   B Natriuretic Peptide 115.5 (H) 0.0 - 100.0 pg/mL    Comment: Performed at [facility] [address], [place], [place] [address]   DG Chest Port 1 View  Result Date: 09/13/2022 CLINICAL DATA:  Chest pain EXAM: PORTABLE CHEST 1 VIEW COMPARISON:  09/11/2022 FINDINGS: Cardiomegaly, vascular congestion. Interstitial prominence could reflect interstitial edema. No effusions or acute bony abnormality. IMPRESSION: Cardiomegaly with vascular congestion and possible early interstitial edema. Electronically Signed   By: Rolm Baptise M.D.   On: 09/13/2022 01:22    DG Chest Port 1 View  Result Date: 09/11/2022 CLINICAL DATA:  Chest pain EXAM: PORTABLE CHEST 1 VIEW COMPARISON:  07/01/2021 FINDINGS: Cardiomegaly with mild perihilar edema and small left pleural effusion. No pneumothorax. IMPRESSION: Cardiomegaly with mild perihilar edema and small left pleural effusion. Electronically Signed   By: Julian Hy M.D.   On: 09/11/2022 22:24    Pending Labs Unresulted Labs (From admission, onward)     Start     Ordered   09/13/22 1212  Magnesium  Once,   R        09/13/22 1211   09/13/22 1212  Phosphorus  Once,   R        09/13/22 1211            Vitals/Pain Today's Vitals   09/13/22 1028 09/13/22 1043 09/13/22 1131 09/13/22 1131  BP: 122/75     Pulse: 64     Resp: 16     Temp:   97.8 F (36.6 C)   TempSrc:   Oral   SpO2: 100%     Weight:      Height:      PainSc:  Asleep  0-No pain    Isolation Precautions No active isolations  Medications Medications  apixaban (ELIQUIS) tablet 5 mg (has no administration in time range)  atorvastatin (LIPITOR) tablet 40 mg (has no administration in time range)  atropine 1 % ophthalmic solution 1 drop (has no administration in time range)  cholecalciferol (VITAMIN D3) 25 MCG (1000 UNIT) tablet 2,000 Units (has no administration in time range)  cyanocobalamin (VITAMIN B12) tablet 500 mcg (has  no administration in time range)  dorzolamide-timolol (COSOPT) 2-0.5 % ophthalmic solution 1 drop (has no administration in time range)  gabapentin (NEURONTIN) capsule 200 mg (has no administration in time range)  ipratropium (ATROVENT) nebulizer solution 0.5 mg (has no administration in time range)  latanoprost (XALATAN) 0.005 % ophthalmic solution 1 drop (has no administration in time range)  losartan (COZAAR) tablet 50 mg (has no administration in time range)  magnesium oxide (MAG-OX) tablet 400 mg (has no administration in time range)  melatonin tablet 5 mg (has no administration in time range)   nitroGLYCERIN (NITROSTAT) SL tablet 0.4 mg (has no administration in time range)  prednisoLONE acetate (PRED FORTE) 1 % ophthalmic suspension 1 drop (has no administration in time range)  senna-docusate (Senokot-S) tablet 2 tablet (has no administration in time range)  tamsulosin (FLOMAX) capsule 0.4 mg (has no administration in time range)  metoprolol succinate (TOPROL-XL) 24 hr tablet 25 mg (has no administration in time range)  aspirin EC tablet 81 mg (has no administration in time range)  alum & mag hydroxide-simeth (MAALOX/MYLANTA) 200-200-20 MG/5ML suspension 30 mL (30 mLs Oral Given 09/13/22 0133)  furosemide (LASIX) injection 20 mg (20 mg Intravenous Given 09/13/22 0722)  alum & mag hydroxide-simeth (MAALOX/MYLANTA) 200-200-20 MG/5ML suspension 30 mL (30 mLs Oral Given 09/13/22 0916)    Mobility non-ambulatory     Focused Assessments Cardiac Assessment Handoff:  Cardiac Rhythm: Atrial fibrillation Lab Results  Component Value Date   CKTOTAL 77 08/05/2020   CKMB 4.4 (H) 08/12/2010   TROPONINI <0.03 10/28/2016   Lab Results  Component Value Date   DDIMER 0.45 08/05/2020   Does the Patient currently have chest pain? No    R Recommendations: See Admitting Provider Note  Report given to:   Additional Notes: pain subsided after maalox.

## 2022-09-14 ENCOUNTER — Observation Stay (HOSPITAL_BASED_OUTPATIENT_CLINIC_OR_DEPARTMENT_OTHER): Payer: Medicare Other

## 2022-09-14 DIAGNOSIS — K219 Gastro-esophageal reflux disease without esophagitis: Secondary | ICD-10-CM

## 2022-09-14 DIAGNOSIS — R079 Chest pain, unspecified: Secondary | ICD-10-CM

## 2022-09-14 DIAGNOSIS — R0789 Other chest pain: Secondary | ICD-10-CM | POA: Diagnosis not present

## 2022-09-14 LAB — BASIC METABOLIC PANEL
Anion gap: 7 (ref 5–15)
BUN: 22 mg/dL (ref 8–23)
CO2: 28 mmol/L (ref 22–32)
Calcium: 9 mg/dL (ref 8.9–10.3)
Chloride: 106 mmol/L (ref 98–111)
Creatinine, Ser: 1.14 mg/dL (ref 0.61–1.24)
GFR, Estimated: >60 mL/min (ref >60)
Glucose, Bld: 140 mg/dL — ABNORMAL HIGH (ref 70–99)
Potassium: 3.6 mmol/L (ref 3.5–5.1)
Sodium: 141 mmol/L (ref 135–145)

## 2022-09-14 LAB — ECHOCARDIOGRAM COMPLETE
AR max vel: 2.85 cm2
AV Area VTI: 3.12 cm2
AV Area mean vel: 2.75 cm2
AV Mean grad: 2 mmHg
AV Peak grad: 3.7 mmHg
Ao pk vel: 0.96 m/s
Area-P 1/2: 4.39 cm2
Calc EF: 57.3 %
Est EF: 55
Height: 71 in
MV M vel: 2.42 m/s
MV Peak grad: 23.4 mmHg
S' Lateral: 3.5 cm
Single Plane A2C EF: 57.8 %
Single Plane A4C EF: 54.3 %
Weight: 4243.34 oz

## 2022-09-14 MED ORDER — BISACODYL 5 MG PO TBEC
5.0000 mg | DELAYED_RELEASE_TABLET | Freq: Once | ORAL | Status: AC
  Administered 2022-09-14: 5 mg via ORAL
  Filled 2022-09-14: qty 1

## 2022-09-14 MED ORDER — ALUM & MAG HYDROXIDE-SIMETH 400-400-40 MG/5ML PO SUSP: 10.0000 mL | Freq: Four times a day (QID) | ORAL | 0 refills | Status: DC | PRN

## 2022-09-14 MED ORDER — POLYETHYLENE GLYCOL 3350 17 G PO PACK
17.0000 g | PACK | Freq: Every day | ORAL | Status: DC
  Administered 2022-09-14: 17 g via ORAL
  Filled 2022-09-14: qty 1

## 2022-09-14 MED ORDER — POTASSIUM CHLORIDE CRYS ER 20 MEQ PO TBCR
40.0000 meq | EXTENDED_RELEASE_TABLET | Freq: Once | ORAL | Status: AC
  Administered 2022-09-14: 40 meq via ORAL
  Filled 2022-09-14: qty 2

## 2022-09-14 MED ORDER — PERFLUTREN LIPID MICROSPHERE
1.0000 mL | INTRAVENOUS | Status: AC | PRN
  Administered 2022-09-14: 2 mL via INTRAVENOUS

## 2022-09-14 MED ORDER — PANTOPRAZOLE SODIUM 40 MG PO TBEC: 40.0000 mg | DELAYED_RELEASE_TABLET | Freq: Every day | ORAL | Status: DC

## 2022-09-14 NOTE — Discharge Summary (Addendum)
Name: Reginald [name] MRN: [id number] DOB: 1935-08-19 87 y.o. PCP: Reginald Band, MD  Date of Admission: 09/13/2022 12:50 AM Date of Discharge: 09/14/2022 Attending Physician: Dr.  Saverio Danker  Discharge Diagnosis: Principal Problem:   Chest pain Active Problems:   Atypical chest pain    Discharge Medications: Allergies as of 09/14/2022       Reactions   Adhesive [tape] Rash        Medication List     TAKE these medications    acetaminophen 325 MG tablet Commonly known as: TYLENOL Take 650 mg by mouth every 4 (four) hours as needed for moderate pain (for pain).   albuterol 108 (90 Base) MCG/ACT inhaler Commonly known as: VENTOLIN HFA Inhale 2 puffs into the lungs 2 (two) times daily as needed for wheezing or shortness of breath.   alum & mag hydroxide-simeth [id number] MG/5ML suspension Commonly known as: MAALOX PLUS Take 10 mLs by mouth every 6 (six) hours as needed for indigestion.   apixaban 5 MG Tabs tablet Commonly known as: ELIQUIS Take 1 tablet (5 mg total) by mouth 2 (two) times daily.   ARTIFICIAL TEARS PF OP Place 1 drop into both eyes 4 (four) times daily.   aspirin EC 81 MG tablet Take 1 tablet (81 mg total) by mouth daily. Swallow whole.   atorvastatin 40 MG tablet Commonly known as: LIPITOR Take 40 mg by mouth at bedtime.   atropine 1 % ophthalmic solution Place 1 drop into the right eye daily.   Biofreeze 4 % Gel Generic drug: Menthol (Topical Analgesic) Apply 1 application. topically in the morning and at bedtime. Apply to neck/shoulder   cyanocobalamin 1000 MCG tablet Commonly known as: VITAMIN B12 Take 500 mcg by mouth daily.   dorzolamide-timolol 2-0.5 % ophthalmic solution Commonly known as: COSOPT Place 1 drop into both eyes 2 (two) times daily.   gabapentin 100 MG capsule Commonly known as: NEURONTIN Take 2 capsules (200 mg total) by mouth at bedtime as needed.   ipratropium 0.02 % nebulizer solution Commonly known as:  ATROVENT Take 0.5 mg by nebulization every 6 (six) hours as needed for wheezing or shortness of breath.   latanoprost 0.005 % ophthalmic solution Commonly known as: XALATAN Place 1 drop into the left eye at bedtime.   losartan 50 MG tablet Commonly known as: COZAAR Take 50 mg by mouth in the morning and at bedtime.   magnesium oxide 400 MG tablet Commonly known as: MAG-OX Take 400 mg by mouth daily.   melatonin 5 MG Tabs Take 5 mg by mouth at bedtime.   metoprolol succinate 25 MG 24 hr tablet Commonly known as: Toprol XL Take 0.5 tablets (12.5 mg total) by mouth daily. What changed: how much to take   nitroGLYCERIN 0.4 MG SL tablet Commonly known as: NITROSTAT Place 0.4 mg under the tongue every 5 (five) minutes x 3 doses as needed for chest pain (and CALL PROVIDER IF NO RELIEF AFTER THE 3RD DOSE).   OXYGEN Inhale 2 L into the lungs at bedtime.   pantoprazole 40 MG tablet Commonly known as: PROTONIX Take 1 tablet (40 mg total) by mouth daily. Start taking on: September 15, 2022   polyethylene glycol 17 g packet Commonly known as: MIRALAX / GLYCOLAX Take 17 g by mouth daily.   prednisoLONE acetate 1 % ophthalmic suspension Commonly known as: PRED FORTE Place 1 drop into the right eye 2 (two) times daily.   sennosides-docusate sodium 8.6-50 MG tablet Commonly known as:  SENOKOT-S Take 2 tablets by mouth 2 (two) times daily.   tamsulosin 0.4 MG Caps capsule Commonly known as: FLOMAX Take 1 capsule (0.4 mg total) by mouth daily after supper.   Trulicity 1.5 0000000 Sopn Generic drug: Dulaglutide Inject 3 mg into the skin once a week. monday   Vascepa 1 g capsule Generic drug: icosapent Ethyl Take 1 g by mouth every evening.   Vitamin D-3 25 MCG (1000 UT) Caps Take 2,000 Units by mouth daily.        Disposition and follow-up:   Mr.Reginald [name] was discharged from [facility] in Good condition.  At the hospital follow up visit please  address:  1.  Follow-up:  a.  Reevaluate for recurrent chest pain while on a daily PPI.  If having any new or worsening chest pain with other symptoms such as dyspnea, fatigue, presyncope he would need reevaluation.    b.  Follow-up on results of TTE here.  He may benefit from SGLT2 inhibitor with his renal function well within the range to start this medication.   c.  Ensure that he is able to get a new CPAP mask as his current one does not fit correctly.  2.  Labs / imaging needed at time of follow-up: BMP  3.  Pending labs/ test needing follow-up: -  Follow-up Appointments:  Follow-up Information     Schedule an appointment as soon as possible for a visit  with Reginald Band, MD.   Specialty: Family Medicine Contact information: [place] [address] [phone]                 Hospital Course by problem list: [name] is a 87 y.o. person living with a history of chronic hypoxia on 2L, COPD, CVA with left sided residual deficits,CAD s/p PCI, HFpEF, paroxysmal atrial fibrillation CKD stg 4, T2DM, HLD, HTN, and glaucoma who presented with chest pressure and admitted for further chest pain evaluation.   Atypical chest pain GERD Patient presented again with chest pain as he did 3 days before this that was again responsive to PPI and Maalox.  Workups both times showed mildly elevated but flat troponins with EKG not showing signs of ischemia.  Nitro was given at this admission without much improvement.  He was admitted for observation and to get a TTE but most likely his pain is secondary to GERD and responded well to PPI and Maalox which he will be discharged with.  CAD s/p PCI to LAD PMHx of CAD with remote history of myocardial infarction s/p PCI to LAD in 1997.  Presented with nonradiating chest pressure that improved with maalox after mild improvement with nitro and aspirin.  Pain did resolve after Maalox and PPI and patient did not have any other  worrisome symptoms for ACS including diaphoresis, palpitations, presyncope, or nausea.  Did not suspect ACS or need for emergent cardiology consult with low/flat troponins and non-significant changes in his EKG. He was admitted for observation and to get a TTE.  After TTE was done he was discharged without any changes in his cardiac medications but the addition of daily PPI and as needed Maalox.   Hx of HFpEF Most recent echocardiogram 2022 with EF 50-55% and inability to assess diastolic function.  No signs of volume overload during admission.  TTE repeated here with LVEF of 55%, normal LV function, moderate concentric LVH, normal right-sided pressures, mildly dilated left atrium.  Would recommend consideration of SGLT2  at follow-up with his CKD appearing to be stage IIIa rather than IV.  Hx of CVA Right ICA stenosis s/p stent History of multiple infarcts, first in 2021 with right central retinal artery occlusion and punctate right paracentral gyrus infarcts. Also found to have right ICA with 70% stenosis. He presented again in 2022 with left hemiparesis and a right PLIC infarct. Since, he has required full assistance with his hemiparesis and has been at [facility]. He has continued on aspirin, eliquis, and atorvastatin daily.  No new or worsening deficits.   Chronic hypoxic respiratory failure COPD On home 2L in no respiratory distress or requiring additional oxygen supplementation. Did not suspect PE without tachycardia and has been compliant on Eliquis.   Paroxysmal atrial fibrillation Rate controlled and adherent to rate control therapy with metoprolol and eliquis 5 mg BID   CKD stage IIIa Per [facility] documents he has CKD stage IV.  Labs here reflect a GFR 51+.  We would recommend continued monitoring of this and outpatient follow-up and updating diagnosis.    Type 2 Diabetes Home regimen of trulicity 3 mg weekly.  He was put on SSI during admission and resumed home therapies on  discharge.   OSA He does not wear his cpap at [facility] because it doesn't fit right. Will need outpatient follow up to refit mask.   Discharge Subjective: Patient is doing well this morning without any return of his chest pain.  He ate well last night but has not had a bowel movement in a couple days.  He states he usually has a daily bowel movement.  He is not having any abdominal pain but does feel little bloated.  Discharge Exam:   BP 129/82 (BP Location: Right Arm)   Pulse 61   Temp 98.2 F (36.8 C) (Oral)   Resp 16   Ht '5\' 11"'$  (1.803 m)   Wt 120.3 kg   SpO2 98%   BMI 36.99 kg/m  Constitutional: Obese elderly male laying in bed, in no acute distress Cardiovascular: Irregularly irregular rhythm without tachycardia Pulmonary/Chest: normal work of breathing on room air Abdominal: soft, mildly distended with mild pressure sensation with palpation, no focal tenderness MSK: Trace lower extremity edema Neurological: alert & oriented x 3 Skin: warm and dry  Pertinent Labs, Studies, and Procedures:     Latest Ref Rng & Units 09/13/2022    1:32 AM 09/11/2022   10:08 PM 07/02/2021    1:50 AM  CBC  WBC 4.0 - 10.5 K/uL 9.8  11.3  11.4   Hemoglobin 13.0 - 17.0 g/dL 14.5  15.5  16.9   Hematocrit 39.0 - 52.0 % 45.9  49.1  53.0   Platelets 150 - 400 K/uL 165  179  194        Latest Ref Rng & Units 09/14/2022    2:44 AM 09/13/2022    1:32 AM 09/11/2022   10:08 PM  CMP  Glucose 70 - 99 mg/dL 140  115  113   BUN 8 - 23 mg/dL '22  19  15   '$ Creatinine 0.61 - 1.24 mg/dL 1.14  1.36  1.14   Sodium 135 - 145 mmol/L 141  140  141   Potassium 3.5 - 5.1 mmol/L 3.6  3.7  4.1   Chloride 98 - 111 mmol/L 106  107  104   CO2 22 - 32 mmol/L '28  28  29   '$ Calcium 8.9 - 10.3 mg/dL 9.0  8.9  9.1  Total Protein 6.5 - 8.1 g/dL  5.1  5.3   Total Bilirubin 0.3 - 1.2 mg/dL  0.9  1.4   Alkaline Phos 38 - 126 U/L  68  70   AST 15 - 41 U/L  16  21   ALT 0 - 44 U/L  16  20     DG Chest Port 1  View  Result Date: 09/13/2022 CLINICAL DATA:  Chest pain EXAM: PORTABLE CHEST 1 VIEW COMPARISON:  09/11/2022 FINDINGS: Cardiomegaly, vascular congestion. Interstitial prominence could reflect interstitial edema. No effusions or acute bony abnormality. IMPRESSION: Cardiomegaly with vascular congestion and possible early interstitial edema. Electronically Signed   By: Rolm Baptise M.D.   On: 09/13/2022 01:22     Discharge Instructions: Discharge Instructions     Call MD for:  difficulty breathing, headache or visual disturbances   Complete by: As directed    Call MD for:  extreme fatigue   Complete by: As directed    Call MD for:  persistant dizziness or light-headedness   Complete by: As directed    Call MD for:  persistant nausea and vomiting   Complete by: As directed    Call MD for:  severe uncontrolled pain   Complete by: As directed    Call MD for:  temperature >100.4   Complete by: As directed    Diet - low sodium heart healthy   Complete by: As directed    Increase activity slowly   Complete by: As directed       [name],  You were recently admitted to [facility] for chest pain that we think is due to acid reflux.   Continue taking your home medications with the following changes  Start taking Pantoprazole 40 mg daily Maalox/Mylanta 10 mL 4 times a day as needed for heartburn  You should seek further medical care if you have recurrent chest pain or new associated symptoms such as shortness of breath, heart palpitations, severe fatigue, lightheadedness, or dizziness.  We recommend that you see your primary care doctor in about a week to make sure that you continue to improve. We are so glad that you are feeling better.  Sincerely, Johny Blamer, DO Signed: Johny Blamer, DO 09/14/2022, 2:07 PM   Pager: [phone]

## 2022-09-14 NOTE — NC FL2 (Signed)
[place] LEVEL OF CARE FORM     IDENTIFICATION  Patient Name: Reginald [name] Birthdate: May 12, 1936 Sex: male Admission Date (Current Location): 09/13/2022  [facility] and [place] Number:  Herbalist and Address:  [facility], [place] [address], [place], [place] [address]      Provider Number: [id number]  Attending Physician Name and Address:  Charise Killian, MD  Relative Name and Phone Number:  [name] [phone]) [phone]    Current Level of Care: Hospital Recommended Level of Care: [place] Prior Approval Number:    Date Approved/Denied:   PASRR Number: [id number]  Discharge Plan: SNF    Current Diagnoses: Patient Active Problem List   Diagnosis Date Noted   Atypical chest pain 09/13/2022   Chest pain 07/01/2021   History of CVA with residual deficit 07/01/2021   Pneumothorax 04/08/2021   Stroke ([place]) 08/05/2020   Vision loss of right eye 06/09/2020   Acute on chronic renal failure ([place]) 12/19/2018   Acute renal failure superimposed on stage 4 chronic kidney disease ([place]) 12/19/2018   AKI (acute kidney injury) ([place]) 11/26/2018   HCAP (healthcare-associated pneumonia) 11/15/2018   COVID-19 11/15/2018   Chronic venous insufficiency 10/04/2017   Venous stasis ulcers of both lower extremities ([place]) 10/04/2017   Nonspecific chest pain 10/27/2016   Morbid obesity ([place]) 05/02/2016   Lymphedema 05/02/2016   Abnormal laboratory test result 10/15/2015   Cough 10/08/2015   Hematuria 09/21/2015   Hypokalemia 08/13/2015   Hypertension associated with diabetes ([place]) 08/13/2015   Multiple open wounds of lower leg 07/22/2015   CKD (chronic kidney disease) stage 3, GFR 30-59 ml/min (HCC) 07/22/2015   Cellulitis of leg, right 06/28/2015   Acute kidney injury superimposed on CKD ([place]) 06/28/2015   Hypertensive heart disease with CHF (congestive heart failure) ([place]) 05/19/2015   CAD (coronary artery disease), native  coronary artery 05/19/2015   Hyperlipidemia associated with type 2 diabetes mellitus ([place]) 05/19/2015   Vitamin D deficiency 05/19/2015   Pressure ulcer [id number]   Diastolic dysfunction with chronic heart failure ([place]) 05/08/2015   Blisters of multiple sites 05/08/2015   Type 2 diabetes mellitus ([place])    Obesity, unspecified    OSA (obstructive sleep apnea)    Permanent atrial fibrillation ([place])    Secondary DM with CKD stage 4 and hypertension (HCC)    Glaucoma    Cellulitis 05/07/2015    Orientation RESPIRATION BLADDER Height & Weight     Self, Time, Situation, Place (WDL)  O2 (Nasal Cannula 3 liters) Incontinent Weight: 265 lb 3.3 oz (120.3 kg) Height:  '5\' 11"'$  (180.3 cm)  BEHAVIORAL SYMPTOMS/MOOD NEUROLOGICAL BOWEL NUTRITION STATUS      Continent (WDL) Diet (Please see discharge summary)  AMBULATORY STATUS COMMUNICATION OF NEEDS Skin       Other (Comment) (Appropriate for ethnicity,dry,Wound/Incision LDAs)                       Personal Care Assistance Level of Assistance              Functional Limitations Info  Sight, Hearing, Speech Sight Info: Impaired Hearing Info: Impaired Speech Info: Adequate    SPECIAL CARE FACTORS FREQUENCY                       Contractures Contractures Info: Not present    Additional Factors Info  Code Status, Allergies Code Status Info: DNR Allergies Info: Adhesive (tape)  Current Medications (09/14/2022):  This is the current hospital active medication list Current Facility-Administered Medications  Medication Dose Route Frequency Provider Last Rate Last Admin   apixaban (ELIQUIS) tablet 5 mg  5 mg Oral BID Katsadouros, Vasilios, MD   5 mg at 09/14/22 [id number]   aspirin EC tablet 81 mg  81 mg Oral Daily Katsadouros, Vasilios, MD   81 mg at 09/14/22 [id number]   atorvastatin (LIPITOR) tablet 40 mg  40 mg Oral QHS Katsadouros, Vasilios, MD   40 mg at 09/13/22 2116   atropine 1 % ophthalmic solution 1 drop  1 drop  Right Eye Daily Katsadouros, Vasilios, MD   1 drop at 09/14/22 0836   cholecalciferol (VITAMIN D3) 25 MCG (1000 UNIT) tablet 2,000 Units  2,000 Units Oral Daily Katsadouros, Vasilios, MD   2,000 Units at 09/14/22 0830   dorzolamide-timolol (COSOPT) 2-0.5 % ophthalmic solution 1 drop  1 drop Both Eyes BID Katsadouros, Vasilios, MD   1 drop at 09/14/22 0837   gabapentin (NEURONTIN) capsule 200 mg  200 mg Oral QHS PRN Katsadouros, Vasilios, MD       ipratropium (ATROVENT) nebulizer solution 0.5 mg  0.5 mg Nebulization Q6H PRN Katsadouros, Vasilios, MD       latanoprost (XALATAN) 0.005 % ophthalmic solution 1 drop  1 drop Left Eye QHS Katsadouros, Vasilios, MD   1 drop at 09/13/22 2122   losartan (COZAAR) tablet 50 mg  50 mg Oral QHS Katsadouros, Vasilios, MD   50 mg at 09/13/22 2116   magnesium oxide (MAG-OX) tablet 400 mg  400 mg Oral Daily Katsadouros, Vasilios, MD   400 mg at 09/14/22 0830   melatonin tablet 5 mg  5 mg Oral QHS Katsadouros, Vasilios, MD   5 mg at 09/13/22 2116   metoprolol succinate (TOPROL-XL) 24 hr tablet 25 mg  25 mg Oral Daily Katsadouros, Vasilios, MD   25 mg at 09/14/22 [id number]   nitroGLYCERIN (NITROSTAT) SL tablet 0.4 mg  0.4 mg Sublingual Q5 Min x 3 PRN Katsadouros, Vasilios, MD       pantoprazole (PROTONIX) EC tablet 40 mg  40 mg Oral Daily Katsadouros, Vasilios, MD   40 mg at 09/14/22 [id number]   polyethylene glycol (MIRALAX / GLYCOLAX) packet 17 g  17 g Oral Daily Mapp, Tavien, MD   17 g at 09/14/22 [id number]   prednisoLONE acetate (PRED FORTE) 1 % ophthalmic suspension 1 drop  1 drop Right Eye BID Katsadouros, Vasilios, MD   1 drop at 09/14/22 [id number]   senna-docusate (Senokot-S) tablet 2 tablet  2 tablet Oral BID Riesa Pope, MD   2 tablet at 09/14/22 [id number]   tamsulosin (FLOMAX) capsule 0.4 mg  0.4 mg Oral QPC supper Katsadouros, Vasilios, MD   0.4 mg at 09/13/22 1755   vitamin B-12 (CYANOCOBALAMIN) tablet 500 mcg  500 mcg Oral Daily Riesa Pope, MD   500 mcg at  09/14/22 0831     Discharge Medications: Please see discharge summary for a list of discharge medications.  Relevant Imaging Results:  Relevant Lab Results:   Additional Information [id number]  Milas Gain, LCSWA

## 2022-09-14 NOTE — TOC Transition Note (Signed)
Transition of Care [facility]) - CM/SW Discharge Note   Patient Details  Name: Reginald [name] MRN: [id number] Date of Birth: May 07, 1936  [facility]) CM/SW Contact:  Milas Gain, [place] Phone Number: 09/14/2022, 2:32 PM   Clinical Narrative:      Patient will DC to: [place]  Anticipated DC date: 09/14/2022  Family notified: [name]   Transport by: Corey Harold   ?  Per MD patient ready for DC to [facility] . RN, patient, patient's family, and facility notified of DC. Discharge Summary sent to facility. RN given number for report [phone] RM# [id number]. DC packet on chart. DNR signed by MD attached to patients DC packet.Ambulance transport requested for patient.  CSW signing off.    Barriers to Discharge: Continued Medical Work up   Patient Goals and CMS Choice   Choice offered to / list presented to : Patient  Discharge Placement                         Discharge Plan and Services Additional resources added to the After Visit Summary for   In-house Referral: Clinical Social Work                                   Social Determinants of Health ([place]) Interventions SDOH Screenings   Food Insecurity: No Food Insecurity (09/13/2022)  Housing: Low Risk  (09/13/2022)  Transportation Needs: No Transportation Needs (09/13/2022)  Utilities: Not At Risk (09/13/2022)  Depression (PHQ2-9): Low Risk  (07/20/2020)  Tobacco Use: Low Risk  (09/13/2022)     Readmission Risk Interventions     No data to display

## 2022-09-14 NOTE — Hospital Course (Addendum)
Reginald [name] is a 87 y.o. person living with a history of chronic hypoxia on 2L, COPD, CVA with left sided residual deficits,CAD s/p PCI, HFpEF, paroxysmal atrial fibrillation CKD stg 4, T2DM, HLD, HTN, and glaucoma who presented with chest pressure and admitted for further chest pain evaluation.   Atypical chest pain Patient presented again with chest pain as he did 3 days before this that was again responsive to PPI and Maalox.  Workups both times showed mildly elevated but flat troponins with EKG not showing signs of ischemia.  Nitro was given at this admission without much improvement.  He was admitted for observation and to get a TTE but most likely his pain is secondary to GERD and responded well to PPI and Maalox which she will be discharged with.  CAD s/p PCI to LAD PMHx of CAD with remote history of myocardial infarction s/p PCI to LAD in 1997.  Presented with nonradiating chest pressure that improved with maalox after mild improvement with nitro and aspirin.  Pain did resolve after Maalox and PPI and patient did not have any other worrisome symptoms for ACS including diaphoresis, palpitations, presyncope, or nausea.  Did not suspect ACS or need for emergent cardiology consult with low/flat troponins and non-significant changes in his EKG. He was admitted for observation and to get a TTE.  After TTE was done he was discharged without any changes in his cardiac medications but the addition of daily PPI and as needed Maalox.   Hx of HFpEF Most recent echocardiogram 2022 with EF 50-55% and inability to assess diastolic function.  No signs of volume overload during admission.  TTE repeated here with LVEF of 55%, normal LV function, moderate concentric LVH, normal right-sided pressures, mildly dilated left atrium.  Would recommend consideration of SGLT2 at follow-up with his CKD appearing to be stage IIIa rather than IV.  Hx of CVA Right ICA stenosis s/p stent History of multiple infarcts, first  in 2021 with right central retinal artery occlusion and punctate right paracentral gyrus infarcts. Also found to have right ICA with 70% stenosis. He presented again in 2022 with left hemiparesis and a right PLIC infarct. Since, he has required full assistance with his hemiparesis and has been at [facility]. He has continued on aspirin, eliquis, and atorvastatin daily.  No new or worsening deficits.   Chronic hypoxic respiratory failure COPD On home 2L in no respiratory distress or requiring additional oxygen supplementation. Did not suspect PE without tachycardia and has been compliant on Eliquis.   Paroxysmal atrial fibrillation Rate controlled and adherent to rate control therapy with metoprolol and eliquis 5 mg BID   CKD stage IIIa Per [facility] documents he has CKD stage IV.  Labs here reflect a GFR 51+.  We would recommend continued monitoring of this and outpatient follow-up and updating diagnosis.    Type 2 Diabetes Home regimen of trulicity 3 mg weekly.  He was put on SSI during admission and resumed home therapies on discharge.   OSA He does not wear his cpap at [facility] because it doesn't fit right. Will need outpatient follow up to refit mask.

## 2022-09-14 NOTE — TOC Initial Note (Signed)
Transition of Care [facility]) - Initial/Assessment Note    Patient Details  Name: Reginald [name] MRN: [id number] Date of Birth: Oct 14, 1935  Transition of [facility]) CM/SW Contact:    Milas Gain, [place] Phone Number: 09/14/2022, 2:31 PM  Clinical Narrative:                  Patient confirmed plan to return back to his long term SNF [facility]. CSW spoke with Star with [facility] who confirmed patient can return back today if medically ready. CSW informed MD. CSW will continue to follow and assist with patients dc planning needs.  Expected Discharge Plan: Skilled Nursing Facility Barriers to Discharge: Continued Medical Work up   Patient Goals and CMS Choice Patient states their goals for this hospitalization and ongoing recovery are:: SNF   Choice offered to / list presented to : Patient      Expected Discharge Plan and Services In-house Referral: Clinical Social Work     Living arrangements for the past 2 months: [place] Expected Discharge Date: 09/14/22                                    Prior Living Arrangements/Services Living arrangements for the past 2 months: [place] Lives with:: Facility Resident Patient language and need for interpreter reviewed:: Yes Do you feel safe going back to the place where you live?: Yes      Need for Family Participation in Patient Care: Yes (Comment) Care giver support system in place?: Yes (comment)   Criminal Activity/Legal Involvement Pertinent to Current Situation/Hospitalization: No - Comment as needed  Activities of Daily Living Home Assistive Devices/Equipment: None (from a nursing home) ADL Screening (condition at time of admission) Patient's cognitive ability adequate to safely complete daily activities?: Yes Is the patient deaf or have difficulty hearing?: Yes Does the patient have difficulty seeing, even when wearing glasses/contacts?: Yes Does the patient have difficulty  concentrating, remembering, or making decisions?: No Patient able to express need for assistance with ADLs?: Yes Does the patient have difficulty dressing or bathing?: No Independently performs ADLs?: No Communication: Independent Dressing (OT): Dependent Is this a change from baseline?: Pre-admission baseline Grooming: Needs assistance Is this a change from baseline?: Pre-admission baseline Feeding: Independent Bathing: Dependent Is this a change from baseline?: Pre-admission baseline Toileting: Dependent Is this a change from baseline?: Pre-admission baseline In/Out Bed: Dependent Is this a change from baseline?: Pre-admission baseline Walks in Home:  (Not Applicable; pt is non ambulatory) Does the patient have difficulty walking or climbing stairs?: Yes (not applicable; patient is nonambulatory) Weakness of Legs: Both Weakness of Arms/Hands: Both  Permission Sought/Granted Permission sought to share information with : Case Manager, Family Supports, Chartered certified accountant granted to share information with : Yes, Verbal Permission Granted     Permission granted to share info w AGENCY: SNF        Emotional Assessment       Orientation: : Oriented to Self, Oriented to Place, Oriented to  Time, Oriented to Situation Alcohol / Substance Use: Not Applicable Psych Involvement: No (comment)  Admission diagnosis:  Atypical chest pain [R07.89] Chest pain [R07.9] Acute on chronic congestive heart failure, unspecified heart failure type [facility]) [I50.9] Patient Active Problem List   Diagnosis Date Noted   Gastroesophageal reflux disease 09/14/2022   Atypical chest pain 09/13/2022   Chest pain 07/01/2021   History of CVA  with residual deficit 07/01/2021   Pneumothorax 04/08/2021   Stroke ([place]) 08/05/2020   Vision loss of right eye 06/09/2020   Acute on chronic renal failure ([place]) 12/19/2018   Acute renal failure superimposed on stage 4 chronic kidney disease  ([place]) 12/19/2018   AKI (acute kidney injury) ([place]) 11/26/2018   HCAP (healthcare-associated pneumonia) 11/15/2018   COVID-19 11/15/2018   Chronic venous insufficiency 10/04/2017   Venous stasis ulcers of both lower extremities ([place]) 10/04/2017   Nonspecific chest pain 10/27/2016   Morbid obesity ([place]) 05/02/2016   Lymphedema 05/02/2016   Abnormal laboratory test result 10/15/2015   Cough 10/08/2015   Hematuria 09/21/2015   Hypokalemia 08/13/2015   Hypertension associated with diabetes ([place]) 08/13/2015   Multiple open wounds of lower leg 07/22/2015   CKD (chronic kidney disease) stage 3, GFR 30-59 ml/min (HCC) 07/22/2015   Cellulitis of leg, right 06/28/2015   Acute kidney injury superimposed on CKD ([place]) 06/28/2015   Hypertensive heart disease with CHF (congestive heart failure) ([place]) 05/19/2015   CAD (coronary artery disease), native coronary artery 05/19/2015   Hyperlipidemia associated with type 2 diabetes mellitus ([place]) 05/19/2015   Vitamin D deficiency 05/19/2015   Pressure ulcer [id number]   Diastolic dysfunction with chronic heart failure ([place]) 05/08/2015   Blisters of multiple sites 05/08/2015   Type 2 diabetes mellitus ([place])    Obesity, unspecified    OSA (obstructive sleep apnea)    Permanent atrial fibrillation ([place])    Secondary DM with CKD stage 4 and hypertension ([place])    Glaucoma    Cellulitis 05/07/2015   PCP:  [name], MD Pharmacy:   [place], [place] [address] [place] [address] Phone: [phone] Fax: [place], [place] [address] [place] [address] Phone: [phone] Fax: [phone]     Social Determinants of Health (SDOH) Social History: SDOH Screenings   Food Insecurity: No Food Insecurity (09/13/2022)  Housing: Low Risk  (09/13/2022)  Transportation Needs: No Transportation Needs (09/13/2022)  Utilities: Not At Risk  (09/13/2022)  Depression (PHQ2-9): Low Risk  (07/20/2020)  Tobacco Use: Low Risk  (09/13/2022)   SDOH Interventions:     Readmission Risk Interventions     No data to display

## 2022-09-14 NOTE — Progress Notes (Signed)
  Echocardiogram 2D Echocardiogram has been performed.  Lana Fish 09/14/2022, 11:34 AM

## 2022-09-30 ENCOUNTER — Encounter: Payer: Self-pay | Admitting: Pulmonary Disease

## 2022-09-30 ENCOUNTER — Ambulatory Visit (INDEPENDENT_AMBULATORY_CARE_PROVIDER_SITE_OTHER): Payer: Medicare Other | Admitting: Pulmonary Disease

## 2022-09-30 VITALS — BP 138/92 | HR 70 | Ht 71.0 in | Wt 263.0 lb

## 2022-09-30 DIAGNOSIS — G4733 Obstructive sleep apnea (adult) (pediatric): Secondary | ICD-10-CM | POA: Diagnosis not present

## 2022-09-30 NOTE — Patient Instructions (Signed)
I will see you back in about 3 months  We will schedule you for an in lab sleep night study Split-night to try and diagnose if there is significant sleep apnea and also try and treat you in the same setting  Continue using oxygen at night  Check oxygen levels during the day to make sure saturations above 90, there is no benefit to using oxygen if your oxygen is not low  Increase activity as tolerated  No benefit to having Reginald [name] spent more than 8 to 10 hours in bed on a nightly basis -Encourage increased activities  Continue nebulization treatments Continue inhalers  Call with significant concerns

## 2022-09-30 NOTE — Progress Notes (Signed)
Reginald [name]    UJ:[id number]    [birth year]  Primary Care Physician:Blake, Rozanna Boer, MD  Referring Physician: [name], MD [place],  [place] [address]  Chief complaint:   Patient being seen for history of obstructive sleep apnea, chronic respiratory failure COPD  HPI:  Patient not sure about the reason for consultation  He said a CPAP was found in a closet and they wondered whether he can start using it again Was diagnosed with obstructive sleep apnea was using CPAP for many years, stopped using about 8 years ago because the mask did not fit well Used to use it regularly Does not recollect having any specific issues outside difficulty tolerating the mask  He does have some difficulty falling asleep  He gets in bed quite early in the evening but does not go to sleep until about midnight Takes him a few hours to fall asleep Will usually get up about 430 to 5 AM to get changed, then goes back to sleep His time out of bed varies a lot Not sure about his mealtimes as well  He does use oxygen supplementation at night Sometimes uses oxygen during the day  Never smoked  Noted on his medications is that he is on Spiriva and also on nebulization treatments  Does have a history of heart disease and history of heart failure, atrial fibrillation, coronary artery disease, hypertension, chronic kidney disease stage IV, diabetes  History of a stroke to left him with 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   albuterol (VENTOLIN HFA) 108 (90 Base) MCG/ACT inhaler Inhale 2 puffs into the lungs 2 (two) times daily as needed for wheezing or shortness of breath. (Patient not taking: Reported on 09/13/2022)   alum & mag hydroxide-simeth (MAALOX PLUS) 400-400-40 MG/5ML suspension Take  10 mLs by mouth every 6 (six) hours as needed for indigestion.   apixaban (ELIQUIS) 5 MG TABS tablet Take 1 tablet (5 mg total) by mouth 2 (two) times daily.   aspirin EC 81 MG EC tablet Take 1 tablet (81 mg total) by mouth daily. Swallow whole.   atorvastatin (LIPITOR) 40 MG tablet Take 40 mg by mouth at bedtime.   atropine 1 % ophthalmic solution Place 1 drop into the right eye daily.   Cholecalciferol (VITAMIN D-3) 25 MCG (1000 UT) CAPS Take 2,000 Units by mouth daily.   Dextran 70-Hypromellose (ARTIFICIAL TEARS PF OP) Place 1 drop into both eyes 4 (four) times daily.   dorzolamide-timolol (COSOPT) 22.3-6.8 MG/ML ophthalmic solution Place 1 drop into both eyes 2 (two) times daily.   Dulaglutide (TRULICITY) 1.5 0000000 SOPN Inject 3 mg into the skin once a week. monday   gabapentin (NEURONTIN) 100 MG capsule Take 2 capsules (200 mg total) by mouth at bedtime as needed.   ipratropium (ATROVENT) 0.02 % nebulizer solution Take 0.5 mg by nebulization every 6 (six) hours as needed for wheezing or shortness of breath.   latanoprost (XALATAN) 0.005 % ophthalmic solution Place 1 drop into the left eye at bedtime.   losartan (COZAAR) 50 MG tablet Take 50 mg by mouth in the morning and at bedtime.   magnesium oxide (MAG-OX) 400 MG tablet Take 400 mg by mouth daily.   Melatonin 5 MG TABS Take 5  mg by mouth at bedtime.   Menthol, Topical Analgesic, (BIOFREEZE) 4 % GEL Apply 1 application. topically in the morning and at bedtime. Apply to neck/shoulder   metoprolol succinate (TOPROL XL) 25 MG 24 hr tablet Take 0.5 tablets (12.5 mg total) by mouth daily. (Patient taking differently: Take 25 mg by mouth daily.)   nitroGLYCERIN (NITROSTAT) 0.4 MG SL tablet Place 0.4 mg under the tongue every 5 (five) minutes x 3 doses as needed for chest pain (and CALL PROVIDER IF NO RELIEF AFTER THE 3RD DOSE).   OXYGEN Inhale 2 L into the lungs at bedtime.   pantoprazole (PROTONIX) 40 MG tablet Take 1 tablet (40 mg total) by  mouth daily.   polyethylene glycol (MIRALAX / GLYCOLAX) 17 g packet Take 17 g by mouth daily.   prednisoLONE acetate (PRED FORTE) 1 % ophthalmic suspension Place 1 drop into the right eye 2 (two) times daily.   sennosides-docusate sodium (SENOKOT-S) 8.6-50 MG tablet Take 2 tablets by mouth 2 (two) times daily.   tamsulosin (FLOMAX) 0.4 MG CAPS capsule Take 1 capsule (0.4 mg total) by mouth daily after supper.   VASCEPA 1 g capsule Take 1 g by mouth every evening.   vitamin B-12 (CYANOCOBALAMIN) 1000 MCG tablet Take 500 mcg by mouth daily.   No facility-administered encounter medications on file as of 09/30/2022.    Allergies as of 09/30/2022 - Review Complete 09/13/2022  Allergen Reaction Noted   Adhesive [tape] Rash 08/11/2016    Past Medical History:  Diagnosis Date   Anteroseptal myocardial infarction [facility])    Carotid artery occlusion    CHF (congestive heart failure) (HCC)    Chronic kidney disease    COPD (chronic obstructive pulmonary disease) (HCC)    Coronary atherosclerosis of unspecified type of vessel, native or graft    a. s/p prior PCI to LAD in 1997   Diverticulosis    ED (erectile dysfunction)    Foley catheter in place    Glaucoma    Hematuria    History of 2019 novel coronavirus disease (COVID-19) 11/2018   Hydronephrosis, bilateral    Hydroureter    Hypercholesteremia    Hypertensive renal disease    Hypogonadism male    Hypokalemia    Obesity hypoventilation syndrome (HCC)    Obesity, unspecified    OSA (obstructive sleep apnea)    Peripheral vascular disease (HCC)    Permanent atrial fibrillation ([place])    Phimosis    Pneumonia    Rhabdomyolysis    Stroke ([place])    Tremor    Type II or unspecified type diabetes mellitus without mention of complication, not stated as uncontrolled    Unspecified essential hypertension    Urinary incontinence    Urinary retention    Vestibulopathy     Past Surgical History:  Procedure Laterality Date    APPENDECTOMY  1985   CARDIAC CATHETERIZATION     CHOLECYSTECTOMY  06/2000   CIRCUMCISION N/A 05/28/2020   Procedure: CIRCUMCISION ADULT;  Surgeon: Franchot Gallo, MD;  Location: [facility] ORS;  Service: Urology;  Laterality: N/A;   CORONARY ANGIOPLASTY  08/14/1995   stent placement to LAD    DORSAL SLIT N/A 05/28/2020   Procedure: DORSAL SLIT;  Surgeon: Franchot Gallo, MD;  Location: [facility];  Service: Urology;  Laterality: N/A;  45 MINS   ENDOVENOUS ABLATION SAPHENOUS VEIN W/ LASER Left 02/21/2018   endovenous laser ablation L GSV by Ruta Hinds MD    [place] Right [phone]  KNEE ARTHROSCOPY Left 1991   LUMBAR FUSION     NOSE SURGERY  1970s   Per Dr. Terrence Dupont [facility] in pt chart   TRANSCAROTID ARTERY REVASCULARIZATION  Right 06/12/2020   Procedure: RIGHT TRANSCAROTID ARTERY REVASCULARIZATION;  Surgeon: Serafina Mitchell, MD;  Location: [facility];  Service: Vascular;  Laterality: Right;    Family History  Problem Relation Age of Onset   COPD Mother    Heart disease Mother    Diabetes Father    Heart disease Father    Diabetes Sister    Heart disease Brother    Heart disease Brother    Breast cancer Maternal Aunt    Diabetes Paternal Grandmother    Colon cancer Maternal Aunt    Ovarian cancer Daughter    Glaucoma Other     Social History   Socioeconomic History   Marital status: Single    Spouse name: Not on file   Number of children: 5   Years of education: Not on file   Highest education level: Not on file  Occupational History   Occupation: retired  Tobacco Use   Smoking status: Never    Passive exposure: Never   Smokeless tobacco: Never  Vaping Use   Vaping Use: Never used  Substance and Sexual Activity   Alcohol use: No    Alcohol/week: 0.0 standard drinks of alcohol   Drug use: No   Sexual activity: Never  Other Topics Concern   Not on file  Social History Narrative   02/17/21 lives at [facility]   Social Determinants of Health    Financial Resource Strain: Not on file  Food Insecurity: No Food Insecurity (09/13/2022)   Hunger Vital Sign    Worried About Running Out of Food in the Last Year: Never true    Ran Out of Food in the Last Year: Never true  Transportation Needs: No Transportation Needs (09/13/2022)   PRAPARE - Hydrologist (Medical): No    Lack of Transportation (Non-Medical): No  Physical Activity: Not on file  Stress: Not on file  Social Connections: Not on file  Intimate Partner Violence: Not At Risk (09/13/2022)   Humiliation, Afraid, Rape, and Kick questionnaire    Fear of Current or Ex-Partner: No    Emotionally Abused: No    Physically Abused: No    Sexually Abused: No    Review of Systems  Respiratory:  Positive for apnea and shortness of breath.   Psychiatric/Behavioral:  Positive for sleep disturbance.     There were no vitals filed for this visit.   Physical Exam Constitutional:      Appearance: He is obese.  HENT:     Head: Normocephalic.     Mouth/Throat:     Mouth: Mucous membranes are moist.  Eyes:     General: No scleral icterus.    Pupils: Pupils are equal, round, and reactive to light.  Cardiovascular:     Rate and Rhythm: Normal rate and regular rhythm.     Heart sounds: No murmur heard.    No friction rub.  Pulmonary:     Effort: No respiratory distress.     Breath sounds: No stridor. No wheezing or rhonchi.  Musculoskeletal:     Cervical back: No rigidity or tenderness.  Neurological:     Mental Status: He is alert.  Psychiatric:        Mood and Affect: Mood normal.       09/30/2022  1:00 PM  Results of the Epworth flowsheet  Sitting and reading 0  Watching TV 2  Sitting, inactive in a public place (e.g. a theatre or a meeting) 2  As a passenger in a car for an hour without a break 2  Lying down to rest in the afternoon when circumstances permit 2  Sitting and talking to someone 0  Sitting quietly after a lunch without  alcohol 2  In a car, while stopped for a few minutes in traffic 0  Total score 10     Data Reviewed: Records that was sent over was reviewed listed in his medical conditions and medications  Assessment:  History of obstructive sleep apnea -Not currently being treated  Daytime sleepiness  Insomnia  History of stroke  History of atrial fibrillation  History of obstructive lung disease  History of diastolic heart failure  Patient with obstructive sleep apnea was not treated at present Goal will be to try and reinitiate treatment  Plan/Recommendations: Will schedule the patient for an in lab sleep split-night study to ascertain severity of sleep disordered breathing and titrating to a CPAP  Continue oxygen use at night at present  Continue bronchodilators  Limit hours in bed to about 8 to 10 hours at most  Increase activities as able  Call with significant concerns   Sherrilyn Rist MD [facility] 09/30/2022, 1:12 PM  CC: [name], MD

## 2022-10-17 ENCOUNTER — Telehealth: Payer: Self-pay | Admitting: Pulmonary Disease

## 2022-10-17 DIAGNOSIS — G4733 Obstructive sleep apnea (adult) (pediatric): Secondary | ICD-10-CM

## 2022-10-17 NOTE — Telephone Encounter (Signed)
Received a call from [place] from [facility] stating that pt is scheduled to have a sleep study tomorrow night 4/9 at the sleep center.  Sleep center will not let them bring a CNA to stay with pt while the study is being done and due to this, they are wanting to know if the sleep study might be able to be changed for pt to do it at [facility].  Please advise on this.  Routing this to both  Dr. Val Eagle and PCCs.

## 2022-10-17 NOTE — Telephone Encounter (Signed)
A home sleep study is not the most optimal study to be done in this gentleman but if we do not have a choice then lets go ahead and schedule for a home sleep study to be done at [facility]

## 2022-10-17 NOTE — Telephone Encounter (Signed)
Order for HST placed  I called Shanda Bumps and left her a detailed msg letting her know this  Closing encounter

## 2022-10-17 NOTE — Telephone Encounter (Signed)
Home sleep study order will need to be placed.

## 2022-10-18 ENCOUNTER — Encounter (HOSPITAL_BASED_OUTPATIENT_CLINIC_OR_DEPARTMENT_OTHER): Payer: Medicare Other | Admitting: Pulmonary Disease

## 2022-10-20 ENCOUNTER — Ambulatory Visit: Payer: Medicare Other

## 2022-10-20 DIAGNOSIS — G4733 Obstructive sleep apnea (adult) (pediatric): Secondary | ICD-10-CM | POA: Diagnosis not present

## 2022-10-27 ENCOUNTER — Telehealth: Payer: Self-pay | Admitting: Pulmonary Disease

## 2022-10-27 DIAGNOSIS — G4733 Obstructive sleep apnea (adult) (pediatric): Secondary | ICD-10-CM | POA: Diagnosis not present

## 2022-10-27 NOTE — Telephone Encounter (Signed)
Call patient  Sleep study result  Date of study: 10/20/2022  Impression: Mild obstructive sleep apnea Severe oxygen desaturations Underlying chronic hypoxemic respiratory failure  Recommendation:  An in lab study will be ideal for management of obstructive sleep apnea and chronic hypoxemia as patient will need oxygen supplementation  If patient unable to have an in lab study performed then CPAP therapy may be initiated at 10 with oxygen supplementation at current oxygen supplementation rates during the daytime and close clinical follow-up with oximetry and monitoring AHI to adjust as needed-this is less ideal treatment

## 2022-11-23 ENCOUNTER — Other Ambulatory Visit: Payer: Self-pay

## 2022-11-23 DIAGNOSIS — G4733 Obstructive sleep apnea (adult) (pediatric): Secondary | ICD-10-CM

## 2022-11-23 DIAGNOSIS — J9611 Chronic respiratory failure with hypoxia: Secondary | ICD-10-CM

## 2022-11-23 NOTE — Telephone Encounter (Signed)
Spoke with patient nurse over at [facility] regarding sleep study    sleep study result   Date of study: 10/20/2022   Impression: Mild obstructive sleep apnea Severe oxygen desaturations Underlying chronic hypoxemic respiratory failure   Recommendation:   An in lab study will be ideal for management of obstructive sleep apnea and chronic hypoxemia as patient will need oxygen supplementation   If patient unable to have an in lab study performed then CPAP therapy may be initiated at 10 with oxygen supplementation at current oxygen supplementation rates during the daytime and close clinical follow-up with oximetry and monitoring AHI to adjust as needed-this is less ideal treatment  Script was sent over to [facility] and ONO was ordered.  Nothing else further needed.

## 2022-11-25 ENCOUNTER — Other Ambulatory Visit: Payer: Self-pay

## 2022-11-25 DIAGNOSIS — G4733 Obstructive sleep apnea (adult) (pediatric): Secondary | ICD-10-CM

## 2022-11-25 DIAGNOSIS — J9611 Chronic respiratory failure with hypoxia: Secondary | ICD-10-CM

## 2022-11-27 ENCOUNTER — Emergency Department (HOSPITAL_COMMUNITY): Payer: Medicare Other

## 2022-11-27 ENCOUNTER — Encounter (HOSPITAL_COMMUNITY): Payer: Self-pay

## 2022-11-27 ENCOUNTER — Inpatient Hospital Stay (HOSPITAL_COMMUNITY)
Admission: EM | Admit: 2022-11-27 | Discharge: 2022-12-02 | DRG: 377 | Disposition: A | Discharge status: Skilled Nursing Facility | Payer: Medicare Other | Source: Skilled Nursing Facility | Attending: Internal Medicine | Admitting: Internal Medicine

## 2022-11-27 DIAGNOSIS — K922 Gastrointestinal hemorrhage, unspecified: Secondary | ICD-10-CM | POA: Diagnosis present

## 2022-11-27 DIAGNOSIS — G4733 Obstructive sleep apnea (adult) (pediatric): Secondary | ICD-10-CM | POA: Diagnosis not present

## 2022-11-27 DIAGNOSIS — Z91048 Other nonmedicinal substance allergy status: Secondary | ICD-10-CM

## 2022-11-27 DIAGNOSIS — I252 Old myocardial infarction: Secondary | ICD-10-CM | POA: Diagnosis not present

## 2022-11-27 DIAGNOSIS — G9341 Metabolic encephalopathy: Secondary | ICD-10-CM | POA: Diagnosis present

## 2022-11-27 DIAGNOSIS — D179 Benign lipomatous neoplasm, unspecified: Secondary | ICD-10-CM | POA: Diagnosis present

## 2022-11-27 DIAGNOSIS — E662 Morbid (severe) obesity with alveolar hypoventilation: Secondary | ICD-10-CM | POA: Diagnosis present

## 2022-11-27 DIAGNOSIS — E1122 Type 2 diabetes mellitus with diabetic chronic kidney disease: Secondary | ICD-10-CM | POA: Diagnosis present

## 2022-11-27 DIAGNOSIS — Z9049 Acquired absence of other specified parts of digestive tract: Secondary | ICD-10-CM

## 2022-11-27 DIAGNOSIS — N529 Male erectile dysfunction, unspecified: Secondary | ICD-10-CM | POA: Diagnosis present

## 2022-11-27 DIAGNOSIS — Z9981 Dependence on supplemental oxygen: Secondary | ICD-10-CM | POA: Diagnosis not present

## 2022-11-27 DIAGNOSIS — I130 Hypertensive heart and chronic kidney disease with heart failure and stage 1 through stage 4 chronic kidney disease, or unspecified chronic kidney disease: Secondary | ICD-10-CM | POA: Diagnosis present

## 2022-11-27 DIAGNOSIS — Z6838 Body mass index (BMI) 38.0-38.9, adult: Secondary | ICD-10-CM

## 2022-11-27 DIAGNOSIS — J440 Chronic obstructive pulmonary disease with acute lower respiratory infection: Secondary | ICD-10-CM | POA: Diagnosis present

## 2022-11-27 DIAGNOSIS — I4821 Permanent atrial fibrillation: Secondary | ICD-10-CM | POA: Diagnosis present

## 2022-11-27 DIAGNOSIS — I110 Hypertensive heart disease with heart failure: Secondary | ICD-10-CM | POA: Diagnosis not present

## 2022-11-27 DIAGNOSIS — D62 Acute posthemorrhagic anemia: Secondary | ICD-10-CM | POA: Diagnosis present

## 2022-11-27 DIAGNOSIS — K5521 Angiodysplasia of colon with hemorrhage: Secondary | ICD-10-CM | POA: Diagnosis present

## 2022-11-27 DIAGNOSIS — N184 Chronic kidney disease, stage 4 (severe): Secondary | ICD-10-CM | POA: Diagnosis present

## 2022-11-27 DIAGNOSIS — Z981 Arthrodesis status: Secondary | ICD-10-CM

## 2022-11-27 DIAGNOSIS — Z9861 Coronary angioplasty status: Secondary | ICD-10-CM

## 2022-11-27 DIAGNOSIS — Z7901 Long term (current) use of anticoagulants: Secondary | ICD-10-CM | POA: Diagnosis not present

## 2022-11-27 DIAGNOSIS — K5520 Angiodysplasia of colon without hemorrhage: Secondary | ICD-10-CM

## 2022-11-27 DIAGNOSIS — E7800 Pure hypercholesterolemia, unspecified: Secondary | ICD-10-CM | POA: Diagnosis present

## 2022-11-27 DIAGNOSIS — L89152 Pressure ulcer of sacral region, stage 2: Secondary | ICD-10-CM | POA: Diagnosis not present

## 2022-11-27 DIAGNOSIS — J449 Chronic obstructive pulmonary disease, unspecified: Secondary | ICD-10-CM | POA: Diagnosis not present

## 2022-11-27 DIAGNOSIS — I69354 Hemiplegia and hemiparesis following cerebral infarction affecting left non-dominant side: Secondary | ICD-10-CM

## 2022-11-27 DIAGNOSIS — B9789 Other viral agents as the cause of diseases classified elsewhere: Secondary | ICD-10-CM | POA: Diagnosis present

## 2022-11-27 DIAGNOSIS — I5032 Chronic diastolic (congestive) heart failure: Secondary | ICD-10-CM | POA: Diagnosis present

## 2022-11-27 DIAGNOSIS — E1151 Type 2 diabetes mellitus with diabetic peripheral angiopathy without gangrene: Secondary | ICD-10-CM | POA: Diagnosis present

## 2022-11-27 DIAGNOSIS — K31819 Angiodysplasia of stomach and duodenum without bleeding: Secondary | ICD-10-CM | POA: Diagnosis not present

## 2022-11-27 DIAGNOSIS — J1289 Other viral pneumonia: Secondary | ICD-10-CM | POA: Diagnosis present

## 2022-11-27 DIAGNOSIS — D649 Anemia, unspecified: Secondary | ICD-10-CM | POA: Diagnosis not present

## 2022-11-27 DIAGNOSIS — Z825 Family history of asthma and other chronic lower respiratory diseases: Secondary | ICD-10-CM

## 2022-11-27 DIAGNOSIS — N1831 Chronic kidney disease, stage 3a: Secondary | ICD-10-CM

## 2022-11-27 DIAGNOSIS — I959 Hypotension, unspecified: Secondary | ICD-10-CM | POA: Diagnosis present

## 2022-11-27 DIAGNOSIS — I509 Heart failure, unspecified: Secondary | ICD-10-CM | POA: Diagnosis not present

## 2022-11-27 DIAGNOSIS — Z8616 Personal history of COVID-19: Secondary | ICD-10-CM | POA: Diagnosis not present

## 2022-11-27 DIAGNOSIS — Z833 Family history of diabetes mellitus: Secondary | ICD-10-CM

## 2022-11-27 DIAGNOSIS — J9611 Chronic respiratory failure with hypoxia: Secondary | ICD-10-CM | POA: Diagnosis present

## 2022-11-27 DIAGNOSIS — E1140 Type 2 diabetes mellitus with diabetic neuropathy, unspecified: Secondary | ICD-10-CM | POA: Diagnosis present

## 2022-11-27 DIAGNOSIS — I2510 Atherosclerotic heart disease of native coronary artery without angina pectoris: Secondary | ICD-10-CM | POA: Diagnosis not present

## 2022-11-27 DIAGNOSIS — Z7982 Long term (current) use of aspirin: Secondary | ICD-10-CM

## 2022-11-27 DIAGNOSIS — K31811 Angiodysplasia of stomach and duodenum with bleeding: Secondary | ICD-10-CM | POA: Diagnosis present

## 2022-11-27 DIAGNOSIS — Z8249 Family history of ischemic heart disease and other diseases of the circulatory system: Secondary | ICD-10-CM

## 2022-11-27 DIAGNOSIS — H409 Unspecified glaucoma: Secondary | ICD-10-CM | POA: Diagnosis present

## 2022-11-27 DIAGNOSIS — Z7985 Long-term (current) use of injectable non-insulin antidiabetic drugs: Secondary | ICD-10-CM

## 2022-11-27 DIAGNOSIS — F32A Depression, unspecified: Secondary | ICD-10-CM | POA: Diagnosis present

## 2022-11-27 DIAGNOSIS — I4891 Unspecified atrial fibrillation: Secondary | ICD-10-CM | POA: Diagnosis not present

## 2022-11-27 DIAGNOSIS — B348 Other viral infections of unspecified site: Secondary | ICD-10-CM | POA: Diagnosis not present

## 2022-11-27 DIAGNOSIS — K921 Melena: Secondary | ICD-10-CM | POA: Diagnosis not present

## 2022-11-27 DIAGNOSIS — Z79899 Other long term (current) drug therapy: Secondary | ICD-10-CM

## 2022-11-27 LAB — CBC WITH DIFFERENTIAL/PLATELET
Abs Immature Granulocytes: 0.04 10*3/uL (ref 0.00–0.07)
Basophils Absolute: 0 10*3/uL (ref 0.0–0.1)
Basophils Relative: 0 %
Eosinophils Absolute: 0.1 10*3/uL (ref 0.0–0.5)
Eosinophils Relative: 1 %
HCT: 21.6 % — ABNORMAL LOW (ref 39.0–52.0)
Hemoglobin: 5.9 g/dL — CL (ref 13.0–17.0)
Immature Granulocytes: 0 %
Lymphocytes Relative: 14 %
Lymphs Abs: 1.2 10*3/uL (ref 0.7–4.0)
MCH: 25 pg — ABNORMAL LOW (ref 26.0–34.0)
MCHC: 27.3 g/dL — ABNORMAL LOW (ref 30.0–36.0)
MCV: 91.5 fL (ref 80.0–100.0)
Monocytes Absolute: 0.8 10*3/uL (ref 0.1–1.0)
Monocytes Relative: 8 %
Neutro Abs: 6.8 10*3/uL (ref 1.7–7.7)
Neutrophils Relative %: 77 %
Platelets: 320 10*3/uL (ref 150–400)
RBC: 2.36 MIL/uL — ABNORMAL LOW (ref 4.22–5.81)
RDW: 17.4 % — ABNORMAL HIGH (ref 11.5–15.5)
WBC: 9 10*3/uL (ref 4.0–10.5)
nRBC: 0.4 % — ABNORMAL HIGH (ref 0.0–0.2)

## 2022-11-27 LAB — COMPREHENSIVE METABOLIC PANEL
ALT: 17 U/L (ref 0–44)
AST: 16 U/L (ref 15–41)
Albumin: 2.4 g/dL — ABNORMAL LOW (ref 3.5–5.0)
Alkaline Phosphatase: 59 U/L (ref 38–126)
Anion gap: 10 (ref 5–15)
BUN: 38 mg/dL — ABNORMAL HIGH (ref 8–23)
CO2: 26 mmol/L (ref 22–32)
Calcium: 8.7 mg/dL — ABNORMAL LOW (ref 8.9–10.3)
Chloride: 105 mmol/L (ref 98–111)
Creatinine, Ser: 1.56 mg/dL — ABNORMAL HIGH (ref 0.61–1.24)
GFR, Estimated: 44 mL/min — ABNORMAL LOW (ref >60)
Glucose, Bld: 136 mg/dL — ABNORMAL HIGH (ref 70–99)
Potassium: 3.6 mmol/L (ref 3.5–5.1)
Sodium: 141 mmol/L (ref 135–145)
Total Bilirubin: 0.5 mg/dL (ref 0.3–1.2)
Total Protein: 4.9 g/dL — ABNORMAL LOW (ref 6.5–8.1)

## 2022-11-27 LAB — LIPASE, BLOOD: Lipase: 31 U/L (ref 11–51)

## 2022-11-27 LAB — BPAM RBC: Blood Product Expiration Date: 202406212359

## 2022-11-27 LAB — PREPARE RBC (CROSSMATCH)

## 2022-11-27 LAB — TROPONIN I (HIGH SENSITIVITY)
Troponin I (High Sensitivity): 45 ng/L — ABNORMAL HIGH (ref <18)
Troponin I (High Sensitivity): 33 ng/L — ABNORMAL HIGH (ref <18)

## 2022-11-27 LAB — TYPE AND SCREEN

## 2022-11-27 LAB — POC OCCULT BLOOD, ED: Fecal Occult Bld: POSITIVE — AB

## 2022-11-27 MED ORDER — SODIUM CHLORIDE 0.9 % IV SOLN
250.0000 mL | INTRAVENOUS | Status: DC
  Administered 2022-11-28: 250 mL via INTRAVENOUS

## 2022-11-27 MED ORDER — PROTHROMBIN COMPLEX CONC HUMAN 500 UNITS IV KIT
4838.0000 [IU] | PACK | Status: DC
  Filled 2022-11-27: qty 4838

## 2022-11-27 MED ORDER — PANTOPRAZOLE SODIUM 40 MG IV SOLR: 40.0000 mg | Freq: Once | INTRAVENOUS | Status: DC

## 2022-11-27 MED ORDER — SODIUM CHLORIDE 0.9 % IV SOLN
500.0000 mg | Freq: Once | INTRAVENOUS | Status: AC
  Administered 2022-11-27: 500 mg via INTRAVENOUS
  Filled 2022-11-27: qty 5

## 2022-11-27 MED ORDER — INSULIN ASPART 100 UNIT/ML IJ SOLN: 0.0000 [IU] | INTRAMUSCULAR | Status: DC

## 2022-11-27 MED ORDER — IPRATROPIUM-ALBUTEROL 0.5-2.5 (3) MG/3ML IN SOLN: 3.0000 mL | RESPIRATORY_TRACT | Status: DC | PRN

## 2022-11-27 MED ORDER — SODIUM CHLORIDE 0.9 % IV BOLUS (SEPSIS): 500.0000 mL | Freq: Once | INTRAVENOUS | Status: DC

## 2022-11-27 MED ORDER — SODIUM CHLORIDE 0.9 % IV SOLN
2.0000 g | Freq: Once | INTRAVENOUS | Status: AC
  Administered 2022-11-27: 2 g via INTRAVENOUS
  Filled 2022-11-27: qty 20

## 2022-11-27 MED ORDER — PANTOPRAZOLE INFUSION (NEW) - SIMPLE MED
8.0000 mg/h | INTRAVENOUS | Status: DC
  Administered 2022-11-27 – 2022-11-30 (×6): 8 mg/h via INTRAVENOUS
  Filled 2022-11-27 (×8): qty 100

## 2022-11-27 MED ORDER — PANTOPRAZOLE 80MG IVPB - SIMPLE MED
80.0000 mg | Freq: Once | INTRAVENOUS | Status: AC
  Administered 2022-11-27: 80 mg via INTRAVENOUS
  Filled 2022-11-27: qty 100

## 2022-11-27 MED ORDER — FUROSEMIDE 10 MG/ML IJ SOLN
20.0000 mg | Freq: Once | INTRAMUSCULAR | Status: AC
  Administered 2022-11-27: 20 mg via INTRAVENOUS
  Filled 2022-11-27: qty 2

## 2022-11-27 MED ORDER — NOREPINEPHRINE 4 MG/250ML-% IV SOLN: 0.0000 ug/min | INTRAVENOUS | Status: DC

## 2022-11-27 MED ORDER — NOREPINEPHRINE 4 MG/250ML-% IV SOLN
2.0000 ug/min | INTRAVENOUS | Status: DC
  Administered 2022-11-29: 2 ug/min via INTRAVENOUS
  Filled 2022-11-27: qty 250

## 2022-11-27 MED ORDER — SODIUM CHLORIDE 0.9% IV SOLUTION: Freq: Once | INTRAVENOUS | Status: DC

## 2022-11-27 MED ORDER — PROTHROMBIN COMPLEX CONC HUMAN 500 UNITS IV KIT
5376.0000 [IU] | PACK | Status: AC
  Administered 2022-11-28: 5376 [IU] via INTRAVENOUS
  Filled 2022-11-27: qty 5376

## 2022-11-27 MED ORDER — PANTOPRAZOLE SODIUM 40 MG IV SOLR: 40.0000 mg | Freq: Two times a day (BID) | INTRAVENOUS | Status: DC

## 2022-11-27 NOTE — ED Provider Notes (Signed)
[place] EMERGENCY DEPARTMENT AT [facility] Provider Note   CSN: [id number] Arrival date & time: 11/27/22  1933     History  Chief Complaint  Patient presents with   Altered Mental Status   Abnormal Lab    Reginald [name] is a 87 y.o. male.  With PMH of DM2, OSA, A-fib on Eliquis, CAD, CKD, CHF sent from [facility] for reported altered mental status and recent lab work showing hemoglobin 5.2.  On exam, patient is awake alert oriented x 4 and able to answer all orientation questions.  Per rehab/SNF facility, he had been more slow to answer questions and repeating some things over again which is why they reported altered mental status.  He just had recent blood work which showed a hemoglobin of 5.2.  His baseline is approximately 15.  He has generalized fatigue and weakness and looks very pale on exam.  He reports his last bowel movement was yesterday but does not know if it has been melena or bright red blood per rectum.  He has had no abdominal pain or vomiting.  He denies any fevers or chills.  He has history of gastritis.  Denies any history of endoscopy.  Denies any chest pain or shortness of breath.  He is chronically on 3 L nasal cannula.  Last dose of Eliquis this morning.   Altered Mental Status Abnormal Lab      Home Medications Prior to Admission medications   Medication Sig Start Date End Date Taking? Authorizing Provider  acetaminophen (TYLENOL) 500 MG tablet Take 500 mg by mouth every 8 (eight) hours as needed for moderate pain (for pain).   Yes [provider]  alum & mag hydroxide-simeth (MAALOX PLUS) 400-400-40 MG/5ML suspension Take 10 mLs by mouth every 6 (six) hours as needed for indigestion. 09/14/22  Yes Rocky Morel, DO  apixaban (ELIQUIS) 5 MG TABS tablet Take 1 tablet (5 mg total) by mouth 2 (two) times daily. 08/07/20  Yes Leeroy Bock, MD  aspirin EC 81 MG EC tablet Take 1 tablet (81 mg total) by mouth daily. Swallow whole.  06/13/20  Yes Kathlen Mody, MD  atorvastatin (LIPITOR) 40 MG tablet Take 40 mg by mouth at bedtime.   Yes [provider]  atropine 1 % ophthalmic solution Place 1 drop into the right eye daily. 06/09/22  Yes [provider]  Cholecalciferol (VITAMIN D-3) 25 MCG (1000 UT) CAPS Take 2,000 Units by mouth daily.   Yes [provider]  dorzolamide-timolol (COSOPT) 22.3-6.8 MG/ML ophthalmic solution Place 1 drop into both eyes 2 (two) times daily. For glaucoma, wait 3 mins between meds   Yes [provider]  Dulaglutide (TRULICITY) 1.5 MG/0.5ML SOPN Inject 3 mg into the skin once a week. monday   Yes [provider]  furosemide (LASIX) 20 MG tablet Take 20 mg by mouth daily. 11/17/22  Yes [provider]  gabapentin (NEURONTIN) 100 MG capsule Take 2 capsules (200 mg total) by mouth at bedtime as needed. Patient taking differently: Take 200 mg by mouth at bedtime. 08/07/20  Yes Leeroy Bock, MD  ipratropium (ATROVENT) 0.02 % nebulizer solution Take 0.5 mg by nebulization every 6 (six) hours as needed for wheezing or shortness of breath.   Yes [provider]  latanoprost (XALATAN) 0.005 % ophthalmic solution Place 1 drop into the left eye at bedtime. 01/03/17  Yes [provider]  losartan (COZAAR) 50 MG tablet Take 50 mg by mouth in the  morning and at bedtime.   Yes [provider]  magnesium oxide (MAG-OX) 400 MG tablet Take 400 mg by mouth daily.   Yes [provider]  Melatonin 5 MG TABS Take 5 mg by mouth at bedtime.   Yes [provider]  Menthol, Topical Analgesic, (BIOFREEZE) 4 % GEL Apply 1 application. topically in the morning and at bedtime. Apply to neck/shoulder   Yes [provider]  metoprolol succinate (TOPROL XL) 25 MG 24 hr tablet Take 0.5 tablets (12.5 mg total) by mouth daily. Patient taking differently: Take 25 mg by mouth daily. 06/14/20 11/27/22 Yes Kathlen Mody, MD   nitroGLYCERIN (NITROSTAT) 0.4 MG SL tablet Place 0.4 mg under the tongue every 5 (five) minutes x 3 doses as needed for chest pain (and CALL PROVIDER IF NO RELIEF AFTER THE 3RD DOSE).   Yes [provider]  pantoprazole (PROTONIX) 40 MG tablet Take 1 tablet (40 mg total) by mouth daily. 09/15/22  Yes Rocky Morel, DO  Polyethyl Glycol-Propyl Glycol (SYSTANE) 0.4-0.3 % GEL ophthalmic gel Place 1 Application into both eyes in the morning, at noon, and at bedtime.   Yes [provider]  polyethylene glycol (MIRALAX / GLYCOLAX) 17 g packet Take 17 g by mouth daily.   Yes [provider]  prednisoLONE acetate (PRED FORTE) 1 % ophthalmic suspension Place 1 drop into the right eye 2 (two) times daily. 06/09/22  Yes [provider]  sennosides-docusate sodium (SENOKOT-S) 8.6-50 MG tablet Take 2 tablets by mouth 2 (two) times daily.   Yes [provider]  sertraline (ZOLOFT) 25 MG tablet Take 25 mg by mouth daily. 11/14/22  Yes [provider]  SPIRIVA RESPIMAT 2.5 MCG/ACT AERS Inhale 1 each into the lungs daily. For COPD 11/22/22  Yes [provider]  tamsulosin (FLOMAX) 0.4 MG CAPS capsule Take 1 capsule (0.4 mg total) by mouth daily after supper. 12/25/18  Yes Hall, Carole N, DO  VASCEPA 1 g capsule Take 1 g by mouth every evening.   Yes [provider]  albuterol (VENTOLIN HFA) 108 (90 Base) MCG/ACT inhaler Inhale 2 puffs into the lungs 2 (two) times daily as needed for wheezing or shortness of breath. 07/03/21   Marrion Coy, MD  Dextran 70-Hypromellose (ARTIFICIAL TEARS PF OP) Place 1 drop into both eyes 4 (four) times daily.    [provider]  OXYGEN Inhale 2 L into the lungs at bedtime.    [provider]  vitamin B-12 (CYANOCOBALAMIN) 1000 MCG tablet Take 500 mcg by mouth daily. Patient not taking: Reported on 11/27/2022    [provider]      Allergies    Adhesive [tape]    Review of Systems    Review of Systems  Physical Exam Updated Vital Signs BP 98/70   Pulse 86   Temp 97.8 F (36.6 C) (Oral)   Resp 18   Wt 126 kg   SpO2 100%   BMI 38.74 kg/m  Physical Exam Constitutional: Alert and orientedx3.  Ill-appearing and pale appearing male sitting up in bed Eyes: Conjunctivae are normal. ENT      Head: Normocephalic and atraumatic.      Mouth/Throat: Mucous membranes are dry with some dark brown tint to patient's tongue Cardiovascular: Irregular irregular rhythm, regular rate, palpable equal radial pulses Respiratory: Normal respiratory effort. Breath sounds are notable for mild crackles b/l . 100% on 3L [place] Gastrointestinal: Soft and nontender.  Large melanotic stool within diaper on exam Musculoskeletal: Significant  3+ equal pitting edema to lower extremities nontender on palpation Neurologic: Normal speech and language.  Moving all 4 extremities .  Sensation grossly intact. Skin: Skin is pale and dry Psychiatric: Mood and affect are normal. Speech and behavior are normal.  ED Results / Procedures / Treatments   Labs (all labs ordered are listed, but only abnormal results are displayed) Labs Reviewed  CBC WITH DIFFERENTIAL/PLATELET - Abnormal; Notable for the following components:      Result Value   RBC 2.36 (*)    Hemoglobin 5.9 (*)    HCT 21.6 (*)    MCH 25.0 (*)    MCHC 27.3 (*)    RDW 17.4 (*)    nRBC 0.4 (*)    All other components within normal limits  COMPREHENSIVE METABOLIC PANEL - Abnormal; Notable for the following components:   Glucose, Bld 136 (*)    BUN 38 (*)    Creatinine, Ser 1.56 (*)    Calcium 8.7 (*)    Total Protein 4.9 (*)    Albumin 2.4 (*)    GFR, Estimated 44 (*)    All other components within normal limits  POC OCCULT BLOOD, ED - Abnormal; Notable for the following components:   Fecal Occult Bld POSITIVE (*)    All other components within normal limits  TROPONIN I (HIGH SENSITIVITY) - Abnormal; Notable for the following  components:   Troponin I (High Sensitivity) 45 (*)    All other components within normal limits  LIPASE, BLOOD  URINALYSIS, W/ REFLEX TO CULTURE (INFECTION SUSPECTED)  PROTIME-INR  APTT  I-STAT VENOUS BLOOD GAS, ED  TYPE AND SCREEN  PREPARE RBC (CROSSMATCH)  TROPONIN I (HIGH SENSITIVITY)    EKG EKG Interpretation  Date/Time:  Sunday Nov 27 2022 19:48:50 EDT Ventricular Rate:  84 PR Interval:    QRS Duration: 95 QT Interval:  397 QTC Calculation: 470 R Axis:   52 Text Interpretation: Atrial fibrillation Anterior infarct, old Borderline repolarization abnormality No significant change since last tracing Confirmed by Vivien Rossetti ([id number]) on 11/27/2022 9:38:23 PM  Radiology DG Chest Portable 1 View  Result Date: 11/27/2022 CLINICAL DATA:  Altered level of consciousness, anemia EXAM: PORTABLE CHEST 1 VIEW COMPARISON:  09/13/2022 FINDINGS: Single frontal view of the chest demonstrates a stable cardiac silhouette. Decreased lung volumes, with chronic central vascular congestion. Bilateral interstitial prominence and left basilar airspace disease suspicious for edema. No effusion or pneumothorax. IMPRESSION: 1. Findings most consistent with congestive heart failure. Superimposed left basilar pneumonia would be difficult to exclude. Electronically Signed   By: Sharlet Salina M.D.   On: 11/27/2022 21:42   CT Head Wo Contrast  Result Date: 11/27/2022 CLINICAL DATA:  Presents from [facility] with altered mental status. Iron-deficiency anemia. EXAM: CT HEAD WITHOUT CONTRAST TECHNIQUE: Contiguous axial images were obtained from the base of the skull through the vertex without intravenous contrast. RADIATION DOSE REDUCTION: This exam was performed according to the departmental dose-optimization program which includes automated exposure control, adjustment of the mA and/or kV according to patient size and/or use of iterative reconstruction technique. COMPARISON:  Head CT 08/09/2020 FINDINGS:  Brain: There is moderately advanced global atrophy with atrophic ventriculomegaly and moderate to severe small vessel disease of the cerebral white matter. A 1 cm right parafalcine calcified meningioma is again noted along the posterosuperior falx. No mass effect. No new asymmetry is seen consistent with an acute cortical based infarct, hemorrhage, mass effect or midline shift. There are tiny chronic bilateral gangliocapsular and left  thalamic lacunar infarcts. Vascular: The carotid siphons are heavily calcified. No hyperdense central vessel. Skull: Negative for fractures or focal lesions. Sinuses/Orbits: Negative orbits. Lobular membrane thickening inferiorly in both maxillary sinuses, focally laterally in the right sphenoid air cell. Other sinuses, right mastoid air cells and both middle ears are clear. Patchy fluid interval developed in the lower left mastoid air cells. Other: None. IMPRESSION: 1. No acute intracranial CT findings or interval changes. 2. Stable atrophy and small-vessel disease. 3. 1 cm right parafalcine calcified meningioma. 4. Sinus disease. 5. Interval development of patchy fluid in the lower left mastoid air cells. Electronically Signed   By: Almira Bar M.D.   On: 11/27/2022 21:23    Procedures .Critical Care  Performed by: Mardene Sayer, MD Authorized by: Mardene Sayer, MD   Critical care provider statement:    Critical care time (minutes):  50   Critical care was necessary to treat or prevent imminent or life-threatening deterioration of the following conditions:  Circulatory failure   Critical care was time spent personally by me on the following activities:  Development of treatment plan with patient or surrogate, evaluation of patient's response to treatment, examination of patient, ordering and review of laboratory studies, ordering and review of radiographic studies, ordering and performing treatments and interventions, pulse oximetry, re-evaluation of  patient's condition, review of old charts, discussions with consultants and obtaining history from patient or surrogate   Care discussed with: admitting provider       Medications Ordered in ED Medications  0.9 %  sodium chloride infusion (Manually program via Guardrails IV Fluids) (0 mLs Intravenous Hold 11/27/22 2225)  pantoprozole (PROTONIX) 80 mg /NS 100 mL infusion (8 mg/hr Intravenous New Bag/Given 11/27/22 2240)  pantoprazole (PROTONIX) injection 40 mg (has no administration in time range)  azithromycin (ZITHROMAX) 500 mg in sodium chloride 0.9 % 250 mL IVPB (500 mg Intravenous New Bag/Given 11/27/22 2316)  norepinephrine (LEVOPHED) 4mg  in (0.016 mg/mL) premix infusion (0 mcg/min Intravenous Hold 11/27/22 2317)  prothrombin complex conc human (KCENTRA) IVPB 5,376 Units (has no administration in time range)  furosemide (LASIX) injection 20 mg (20 mg Intravenous Given 11/27/22 2217)  pantoprazole (PROTONIX) 80 mg /NS 100 mL IVPB (0 mg Intravenous Stopped 11/27/22 2237)  cefTRIAXone (ROCEPHIN) 2 g in sodium chloride 0.9 % 100 mL IVPB (0 g Intravenous Stopped 11/27/22 2313)    ED Course/ Medical Decision Making/ A&P Clinical Course as of 11/27/22 2335  Sun Nov 27, 2022  2205 Spoke with Dr. Mansouratyrecommending Protonix infusion and transfusion.  They will add him to the list to be seen tomorrow.  Plans for likely endoscopy.  Patient remains hemodynamically stable, no plans for emergent reversal of Eliquis at this time.  However he will need Eliquis held.  No history of liver disease or cirrhosis suggestive of varices. [VB]  2254 I am ordering for Eliquis reversal with new downtrending maps.  Especially with significant drop in hemoglobin and active melanotic stools visualized on exam.  Downtrending blood pressures although still awake alert conversant palpable radial pulse. [VB]  2323 Spoke with ICU due to acute change.  I have ordered for nor epi drip.  Was working on peripheral IV but  nurse does not want left AC access as it would limit her ability to take accurate blood pressures. [VB]    Clinical Course User Index [VB] Mardene Sayer, MD  Medical Decision Making  Reginald [name] is a 87 y.o. male.  With PMH of DM2, OSA, A-fib on Eliquis, CAD, CKD, CHF sent from [facility] for reported altered mental status and recent lab work showing hemoglobin 5.2.  On exam, patient is awake alert oriented x 4 and able to answer all orientation questions.  CT head reviewed by me no evidence of ICH.  Patient's presentation is concerning for upper GI bleed.  Hemoglobin 5.9 today down from baseline of approximately 15.  Initially was hemodynamically stable but had downtrending blood pressures although still awake alert oriented and conversing.  This is in the setting as well of fluid overload and known CHF.  Chest x-ray with evidence of CHF with possible left basilar pneumonia thus covered with Rocephin and azithromycin.  Patient has obvious melena on exam started 2 units PRBC transfusion after patient agreed to life-saving blood products as well as IV Protonix infusion after discussion with Dr. Meridee Score.  Initial discussion with GI team was when patient's vitals were more stable, plan was for Protonix infusion n.p.o. at midnight and scope tomorrow.  He has no history of cirrhosis or alcohol use, doubt variceal bleed, held off from octreotide infusion.  I have ordered for emergent Eliquis reversal with Kcentra due to downtrending blood pressures and significant GI bleed.  Ordered for norepinephrine for pressor support.  ICU to admit and further manage.  Amount and/or Complexity of Data Reviewed Labs: ordered. Radiology: ordered.  Risk Prescription drug management. Decision regarding hospitalization.    Final Clinical Impression(s) / ED Diagnoses Final diagnoses:  Anemia, unspecified type  Gastrointestinal hemorrhage, unspecified gastrointestinal  hemorrhage type    Rx / DC Orders ED Discharge Orders     None         Mardene Sayer, MD 11/27/22 2335    Mardene Sayer, MD 11/28/22 [phone]

## 2022-11-27 NOTE — H&P (Incomplete)
NAME:  Reginald [name], MRN:  [id number], DOB:  Nov 24, 1935, LOS: 0 ADMISSION DATE:  11/27/2022, CONSULTATION DATE:  5/19 REFERRING MD:  Dr. Elpidio Anis, CHIEF COMPLAINT:  gi bleed   History of Present Illness:  Patient is a 87 year old male with pertinent PMH paroxysmal A-fib on Eliquis, COPD on 2 LNC, CVA with left-sided deficits, CKD stage IV, T2DM, HFpEF, HLD, HTN, CAD presents to [facility] ED on 5/19 w/ GI bleed.  Patient is a resident at [facility].  On 5/19 noted to have altered mental status and recent lab work showing Hgb 5.2.  Came to [facility] ED for further eval.  Upon arrival to [facility], patient initially normotensive.  CT head with no acute abnormality.  UA pending.  CBC here showing hemoglobin 5.9. Had episode of melena and fecal occult positive Patient typed and screened and transfused 2 units PRBCs.  Patient is on Eliquis outpatient for A-fib. PPI bolus given then started on Protonix infusion. Kcentra given.  Patient becoming hypotensive and may need pressors despite transfusions. PCCM consulted for ICU admission.  Pertinent  Medical History   Past Medical History:  Diagnosis Date  . Anteroseptal myocardial infarction (HCC)   . Carotid artery occlusion   . CHF (congestive heart failure) (HCC)   . Chronic kidney disease   . COPD (chronic obstructive pulmonary disease) (HCC)   . Coronary atherosclerosis of unspecified type of vessel, native or graft    a. s/p prior PCI to LAD in 1997  . Diverticulosis   . ED (erectile dysfunction)   . Foley catheter in place   . Glaucoma   . Hematuria   . History of 2019 novel coronavirus disease (COVID-19) 11/2018  . Hydronephrosis, bilateral   . Hydroureter   . Hypercholesteremia   . Hypertensive renal disease   . Hypogonadism male   . Hypokalemia   . Obesity hypoventilation syndrome (HCC)   . Obesity, unspecified   . OSA (obstructive sleep apnea)   . Peripheral vascular disease (HCC)   . Permanent atrial fibrillation (HCC)   . Phimosis   .  Pneumonia   . Rhabdomyolysis   . Stroke (HCC)   . Tremor   . Type II or unspecified type diabetes mellitus without mention of complication, not stated as uncontrolled   . Unspecified essential hypertension   . Urinary incontinence   . Urinary retention   . Vestibulopathy      Significant Hospital Events: Including procedures, antibiotic start and stop dates in addition to other pertinent events   5/19 admitted to [facility] w/ GI bleed on eliquis; given kcentra and PPI; pccm to admit to icu  Interim History / Subjective:  Patient's last map 71 Patient pleasantly confused but able to state name Denies any chest pain, sob, abdominal pain  Objective   Blood pressure 98/70, pulse 86, temperature 97.8 F (36.6 C), temperature source Oral, resp. rate 18, weight 126 kg, SpO2 100 %.        Intake/Output Summary (Last 24 hours) at 11/27/2022 2317 Last data filed at 11/27/2022 2239 Gross per 24 hour  Intake 98.62 ml  Output --  Net 98.62 ml   Filed Weights   11/27/22 2253 11/27/22 2302  Weight: 117.9 kg 126 kg    Examination: General:   elderly pale appearing male in NAD HEENT: MM pink/dry; Newell in place Neuro: pleasantly confused but able to state name and follow commands CV: s1s2, RRR, no m/r/g PULM:  dim clear BS bilaterally; [place] 2L GI: soft, bsx4 active  Extremities: warm/dry, ble 1+ edema  Skin: no rashes or lesions appreciated   Resolved Hospital Problem list     Assessment & Plan:  GI bleed ABLA Plan: -given soft bp will admit to icu for closer monitoring  Chronic hypoxic respiratory failure: on 2l [place] at home COPD OSA Possible LLL pneumonia  Plan: -continue home 2L [place] for sat goal >92% -CPAP nightly  Paroxysmal A-fib on Eliquis Plan: -  Hx of HFpEF CAD HTN HLD Plan: -  T2DM Plan: -SSI and CBG monitoring  Hx of CVA: Left-sided deficits Plan: -  Elevated creatinine CKD stage IV Plan: -Trend BMP / urinary output -Replace electrolytes as  indicated -Avoid nephrotoxic agents, ensure adequate renal perfusion   Hx of glaucoma and right visual deficits  Plan: -  Best Practice (right click and "Reselect all SmartList Selections" daily)   Diet/type: {diet type:25684} DVT prophylaxis: {anticoagulation (Optional):25687} GI prophylaxis: {WU:98119} Lines: {Central Venous Access:25771} Foley:  {Central Venous Access:25691} Code Status:  {Code Status:26939} Last date of multidisciplinary goals of care discussion [***]  Labs   CBC: Recent Labs  Lab 11/27/22 2118  WBC 9.0  NEUTROABS 6.8  HGB 5.9*  HCT 21.6*  MCV 91.5  PLT 320    Basic Metabolic Panel: Recent Labs  Lab 11/27/22 2118  NA 141  K 3.6  CL 105  CO2 26  GLUCOSE 136*  BUN 38*  CREATININE 1.56*  CALCIUM 8.7*   GFR: Estimated Creatinine Clearance: 47.7 mL/min (A) (by C-G formula based on SCr of 1.56 mg/dL (H)). Recent Labs  Lab 11/27/22 2118  WBC 9.0    Liver Function Tests: Recent Labs  Lab 11/27/22 2118  AST 16  ALT 17  ALKPHOS 59  BILITOT 0.5  PROT 4.9*  ALBUMIN 2.4*   Recent Labs  Lab 11/27/22 2118  LIPASE 31   No results for input(s): "AMMONIA" in the last 168 hours.  ABG    Component Value Date/Time   TCO2 31 04/08/2021 1953     Coagulation Profile: No results for input(s): "INR", "PROTIME" in the last 168 hours.  Cardiac Enzymes: No results for input(s): "CKTOTAL", "CKMB", "CKMBINDEX", "TROPONINI" in the last 168 hours.  HbA1C: Hgb A1c MFr Bld  Date/Time Value Ref Range Status  04/09/2021 06:45 AM 6.0 (H) 4.8 - 5.6 % Final    Comment:    (NOTE) Pre diabetes:          5.7%-6.4%  Diabetes:              >6.4%  Glycemic control for   <7.0% adults with diabetes   08/06/2020 04:33 AM 7.4 (H) 4.8 - 5.6 % Final    Comment:    (NOTE) Pre diabetes:          5.7%-6.4%  Diabetes:              >6.4%  Glycemic control for   <7.0% adults with diabetes     CBG: No results for input(s): "GLUCAP" in the last 168  hours.  Review of Systems:   ***  Past Medical History:  He,  has a past medical history of Anteroseptal myocardial infarction [facility]), Carotid artery occlusion, CHF (congestive heart failure) (HCC), Chronic kidney disease, COPD (chronic obstructive pulmonary disease) (HCC), Coronary atherosclerosis of unspecified type of vessel, native or graft, Diverticulosis, ED (erectile dysfunction), Foley catheter in place, Glaucoma, Hematuria, History of 2019 novel coronavirus disease (COVID-19) (11/2018), Hydronephrosis, bilateral, Hydroureter, Hypercholesteremia, Hypertensive renal disease, Hypogonadism male, Hypokalemia, Obesity hypoventilation syndrome (HCC), Obesity,  unspecified, OSA (obstructive sleep apnea), Peripheral vascular disease (HCC), Permanent atrial fibrillation (HCC), Phimosis, Pneumonia, Rhabdomyolysis, Stroke (HCC), Tremor, Type II or unspecified type diabetes mellitus without mention of complication, not stated as uncontrolled, Unspecified essential hypertension, Urinary incontinence, Urinary retention, and Vestibulopathy.   Surgical History:   Past Surgical History:  Procedure Laterality Date  . APPENDECTOMY  1985  . CARDIAC CATHETERIZATION    . CHOLECYSTECTOMY  06/2000  . CIRCUMCISION N/A 05/28/2020   Procedure: CIRCUMCISION ADULT;  Surgeon: Marcine Matar, MD;  Location: [facility] ORS;  Service: Urology;  Laterality: N/A;  . CORONARY ANGIOPLASTY  08/14/1995   stent placement to LAD   . DORSAL SLIT N/A 05/28/2020   Procedure: DORSAL SLIT;  Surgeon: Marcine Matar, MD;  Location: [facility] ORS;  Service: Urology;  Laterality: N/A;  45 MINS  . ENDOVENOUS ABLATION SAPHENOUS VEIN W/ LASER Left 02/21/2018   endovenous laser ablation L GSV by Fabienne Bruns MD   . INGUINAL HERNIA REPAIR Right 1985  . KNEE ARTHROSCOPY Left 1991  . LUMBAR FUSION    . NOSE SURGERY  1970s   Per Dr. Sharyn Lull [facility] in pt chart  . TRANSCAROTID ARTERY REVASCULARIZATION  Right 06/12/2020   Procedure: RIGHT  TRANSCAROTID ARTERY REVASCULARIZATION;  Surgeon: Nada Libman, MD;  Location[facility];  Service: Vascular;  Laterality: Right;     Social History:   reports that he has never smoked. He has never been exposed to tobacco smoke. He has never used smokeless tobacco. He reports that he does not drink alcohol and does not use drugs.   Family History:  His family history includes Breast cancer in his maternal aunt; COPD in his mother; Colon cancer in his maternal aunt; Diabetes in his father, paternal grandmother, and sister; Glaucoma in an other family member; Heart disease in his brother, brother, father, and mother; Ovarian cancer in his daughter.   Allergies Allergies  Allergen Reactions  . Adhesive [Tape] Rash     Home Medications  Prior to Admission medications   Medication Sig Start Date End Date Taking? Authorizing Provider  acetaminophen (TYLENOL) 500 MG tablet Take 500 mg by mouth every 8 (eight) hours as needed for moderate pain (for pain).   Yes [provider]  alum & mag hydroxide-simeth (MAALOX PLUS) 400-400-40 MG/5ML suspension Take 10 mLs by mouth every 6 (six) hours as needed for indigestion. 09/14/22  Yes Rocky Morel, DO  apixaban (ELIQUIS) 5 MG TABS tablet Take 1 tablet (5 mg total) by mouth 2 (two) times daily. 08/07/20  Yes Leeroy Bock, MD  aspirin EC 81 MG EC tablet Take 1 tablet (81 mg total) by mouth daily. Swallow whole. 06/13/20  Yes Kathlen Mody, MD  atorvastatin (LIPITOR) 40 MG tablet Take 40 mg by mouth at bedtime.   Yes [provider]  atropine 1 % ophthalmic solution Place 1 drop into the right eye daily. 06/09/22  Yes [provider]  Cholecalciferol (VITAMIN D-3) 25 MCG (1000 UT) CAPS Take 2,000 Units by mouth daily.   Yes [provider]  dorzolamide-timolol (COSOPT) 22.3-6.8 MG/ML ophthalmic solution Place 1 drop into both eyes 2 (two) times daily. For glaucoma, wait 3 mins between meds   Yes [provider]  Dulaglutide (TRULICITY) 1.5 MG/0.5ML SOPN Inject 3 mg into the skin once a week. monday   Yes [provider]  furosemide (LASIX) 20 MG tablet Take 20 mg by mouth daily. 11/17/22  Yes [provider]  gabapentin (NEURONTIN) 100 MG capsule Take  2 capsules (200 mg total) by mouth at bedtime as needed. Patient taking differently: Take 200 mg by mouth at bedtime. 08/07/20  Yes Leeroy Bock, MD  ipratropium (ATROVENT) 0.02 % nebulizer solution Take 0.5 mg by nebulization every 6 (six) hours as needed for wheezing or shortness of breath.   Yes [provider]  latanoprost (XALATAN) 0.005 % ophthalmic solution Place 1 drop into the left eye at bedtime. 01/03/17  Yes [provider]  losartan (COZAAR) 50 MG tablet Take 50 mg by mouth in the morning and at bedtime.   Yes [provider]  magnesium oxide (MAG-OX) 400 MG tablet Take 400 mg by mouth daily.   Yes [provider]  Melatonin 5 MG TABS Take 5 mg by mouth at bedtime.   Yes [provider]  Menthol, Topical Analgesic, (BIOFREEZE) 4 % GEL Apply 1 application. topically in the morning and at bedtime. Apply to neck/shoulder   Yes [provider]  metoprolol succinate (TOPROL XL) 25 MG 24 hr tablet Take 0.5 tablets (12.5 mg total) by mouth daily. Patient taking differently: Take 25 mg by mouth daily. 06/14/20 11/27/22 Yes Kathlen Mody, MD  nitroGLYCERIN (NITROSTAT) 0.4 MG SL tablet Place 0.4 mg under the tongue every 5 (five) minutes x 3 doses as needed for chest pain (and CALL PROVIDER IF NO RELIEF AFTER THE 3RD DOSE).   Yes [provider]  pantoprazole (PROTONIX) 40 MG tablet Take 1 tablet (40 mg total) by mouth daily. 09/15/22  Yes Rocky Morel, DO  Polyethyl Glycol-Propyl Glycol (SYSTANE) 0.4-0.3 % GEL ophthalmic gel Place 1 Application into both eyes in the morning, at noon, and at bedtime.   Yes [provider]  polyethylene glycol (MIRALAX / GLYCOLAX)  17 g packet Take 17 g by mouth daily.   Yes [provider]  prednisoLONE acetate (PRED FORTE) 1 % ophthalmic suspension Place 1 drop into the right eye 2 (two) times daily. 06/09/22  Yes [provider]  sennosides-docusate sodium (SENOKOT-S) 8.6-50 MG tablet Take 2 tablets by mouth 2 (two) times daily.   Yes [provider]  sertraline (ZOLOFT) 25 MG tablet Take 25 mg by mouth daily. 11/14/22  Yes [provider]  SPIRIVA RESPIMAT 2.5 MCG/ACT AERS Inhale 1 each into the lungs daily. For COPD 11/22/22  Yes [provider]  tamsulosin (FLOMAX) 0.4 MG CAPS capsule Take 1 capsule (0.4 mg total) by mouth daily after supper. 12/25/18  Yes Hall, Carole N, DO  VASCEPA 1 g capsule Take 1 g by mouth every evening.   Yes [provider]  albuterol (VENTOLIN HFA) 108 (90 Base) MCG/ACT inhaler Inhale 2 puffs into the lungs 2 (two) times daily as needed for wheezing or shortness of breath. 07/03/21   Marrion Coy, MD  Dextran 70-Hypromellose (ARTIFICIAL TEARS PF OP) Place 1 drop into both eyes 4 (four) times daily.    [provider]  OXYGEN Inhale 2 L into the lungs at bedtime.    [provider]  vitamin B-12 (CYANOCOBALAMIN) 1000 MCG tablet Take 500 mcg by mouth daily. Patient not taking: Reported on 11/27/2022    [provider]     Critical care time: 45 minutes    JD Anselm Lis [facility] 11/27/2022, 11:17 PM  Please see [email or web address] for pager details.  From 7A-7P if no response, please call [phone]. After hours, please call ELink [phone].

## 2022-11-27 NOTE — H&P (Addendum)
NAME:  DAKWON RIPPON, MRN:  [id number], DOB:  Sep 11, 1935, LOS: 0 ADMISSION DATE:  11/27/2022, CONSULTATION DATE:  5/19 REFERRING MD:  Dr. Elpidio Anis, CHIEF COMPLAINT:  gi bleed   History of Present Illness:  Patient is a 87 year old male with pertinent PMH paroxysmal A-fib on Eliquis, COPD on 2 LNC, CVA with left-sided deficits, CKD stage IV, T2DM, HFpEF, HLD, HTN, CAD presents to [facility] ED on 5/19 w/ GI bleed.  Patient is a resident at [facility].  Patient recently admitted on 09/2022 for atypical chest pain thought related to GERD given improvement with PPI and maalox. Patient discharged w/ PPI. On 5/19 noted to have altered mental status and recent lab work showing Hgb 5.2.  Came to [facility] ED for further eval.  Upon arrival to [facility], patient initially normotensive.  CT head with no acute abnormality.  UA pending.  CBC here showing hemoglobin 5.9. Had episode of melena and fecal occult positive Patient typed and screened and transfused 2 units PRBCs.  Patient is on Eliquis outpatient for A-fib; last dose given this am. PPI bolus given then started on Protonix infusion. Kcentra given. Patient's BP continued to become soft while in ED. PCCM consulted for ICU admission.  Pertinent  Medical History   Past Medical History:  Diagnosis Date   Anteroseptal myocardial infarction [facility])    Carotid artery occlusion    CHF (congestive heart failure) (HCC)    Chronic kidney disease    COPD (chronic obstructive pulmonary disease) (HCC)    Coronary atherosclerosis of unspecified type of vessel, native or graft    a. s/p prior PCI to LAD in 1997   Diverticulosis    ED (erectile dysfunction)    Foley catheter in place    Glaucoma    Hematuria    History of 2019 novel coronavirus disease (COVID-19) 11/2018   Hydronephrosis, bilateral    Hydroureter    Hypercholesteremia    Hypertensive renal disease    Hypogonadism male    Hypokalemia    Obesity hypoventilation syndrome (HCC)    Obesity, unspecified     OSA (obstructive sleep apnea)    Peripheral vascular disease (HCC)    Permanent atrial fibrillation (HCC)    Phimosis    Pneumonia    Rhabdomyolysis    Stroke (HCC)    Tremor    Type II or unspecified type diabetes mellitus without mention of complication, not stated as uncontrolled    Unspecified essential hypertension    Urinary incontinence    Urinary retention    Vestibulopathy      Significant Hospital Events: Including procedures, antibiotic start and stop dates in addition to other pertinent events   5/19 admitted to [facility] w/ GI bleed on eliquis; given kcentra and PPI; pccm to admit to icu  Interim History / Subjective:  Patient's last map 93 Patient AOx3 Denies any chest pain, sob, abdominal pain Patient not able to give complete history of what brought him into hospital and doesn't recall having any bloody stools  Objective   Blood pressure 98/70, pulse 86, temperature 97.8 F (36.6 C), temperature source Oral, resp. rate 18, weight 126 kg, SpO2 100 %.        Intake/Output Summary (Last 24 hours) at 11/27/2022 2317 Last data filed at 11/27/2022 2239 Gross per 24 hour  Intake 98.62 ml  Output --  Net 98.62 ml   Filed Weights   11/27/22 2253 11/27/22 2302  Weight: 117.9 kg 126 kg    Examination: General:  elderly pale appearing male in NAD HEENT: MM pink/dry; [place] in place Neuro:  Aox3 CV: s1s2, RRR, no m/r/g PULM:  dim clear BS bilaterally; [place] 2L GI: soft, bsx4 active  Extremities: warm/dry, ble 1+ edema  Skin: no rashes or lesions appreciated  Resolved Hospital Problem list     Assessment & Plan:  GI bleed ABLA Plan: -given soft bp will admit to icu for closer monitoring -GI consulted; appreciate recs -continue to transfuse PRBCs -serial H/H -cont protonix infusion -hold eliquis; given kcentra; scds for dvt ppx -NPO; will likely need EGD in am per GI -if map remains <65 despite transfusions may need levo  Chronic hypoxic respiratory failure:  on 2l [place] at home COPD: on spiriva and albuterol OSA Possible LLL pneumonia  Plan: -continue home 2L [place] for sat goal >92% -prn duoneb for wheezing -incruse ellipta -CPAP nightly -pulm toiletry -was given dose of rocephin/azithro in ED; will change to zosyn/vanc to cover for HAP -check pct, rvp, covid/flu, mrs pcr, urine legionella/strept  Acute metabolic encephalopathy Hx of CVA: Left-sided deficits -ct head negative for acute abnormality Plan: -patient currently AOx3 -limit sedating meds -check ua, ammonia, ethanol, uds  Paroxysmal A-fib on Eliquis Plan: -tele monitoring -hold eliquis; scds for dvt ppx -hold metoprolol  Hx of HFpEF CAD HTN HLD Plan: -hold asa, statin, vascepa -hold lasix, losartan, and metoprolol  T2DM Peripheral neuropathy Plan: -SSI and CBG monitoring -hold gabapentin   Elevated creatinine CKD stage IV Plan: -Trend BMP / urinary output -Replace electrolytes as indicated -Avoid nephrotoxic agents, ensure adequate renal perfusion  Hx of glaucoma and right visual deficits  Plan: -cont home eye drops  Depression Plan: -hold sertraline  Best Practice (right click and "Reselect all SmartList Selections" daily)   Diet/type: NPO DVT prophylaxis: SCD GI prophylaxis: PPI Lines: N/A Foley:  N/A Code Status:  full code Last date of multidisciplinary goals of care discussion [5/19 updated daughter [facility] over phone. [facility] called and said the most recent paperwork they have from 09/2022 is patient is full code although there is also paperwork from 07/2021 stating patient is DNR. Daughter would like to continue full code for now.]  Labs   CBC: Recent Labs  Lab 11/27/22 2118  WBC 9.0  NEUTROABS 6.8  HGB 5.9*  HCT 21.6*  MCV 91.5  PLT 320    Basic Metabolic Panel: Recent Labs  Lab 11/27/22 2118  NA 141  K 3.6  CL 105  CO2 26  GLUCOSE 136*  BUN 38*  CREATININE 1.56*  CALCIUM 8.7*   GFR: Estimated Creatinine Clearance:  47.7 mL/min (A) (by C-G formula based on SCr of 1.56 mg/dL (H)). Recent Labs  Lab 11/27/22 2118  WBC 9.0    Liver Function Tests: Recent Labs  Lab 11/27/22 2118  AST 16  ALT 17  ALKPHOS 59  BILITOT 0.5  PROT 4.9*  ALBUMIN 2.4*   Recent Labs  Lab 11/27/22 2118  LIPASE 31   No results for input(s): "AMMONIA" in the last 168 hours.  ABG    Component Value Date/Time   TCO2 31 04/08/2021 1953     Coagulation Profile: No results for input(s): "INR", "PROTIME" in the last 168 hours.  Cardiac Enzymes: No results for input(s): "CKTOTAL", "CKMB", "CKMBINDEX", "TROPONINI" in the last 168 hours.  HbA1C: Hgb A1c MFr Bld  Date/Time Value Ref Range Status  04/09/2021 06:45 AM 6.0 (H) 4.8 - 5.6 % Final    Comment:    (NOTE) Pre diabetes:  5.7%-6.4%  Diabetes:              >6.4%  Glycemic control for   <7.0% adults with diabetes   08/06/2020 04:33 AM 7.4 (H) 4.8 - 5.6 % Final    Comment:    (NOTE) Pre diabetes:          5.7%-6.4%  Diabetes:              >6.4%  Glycemic control for   <7.0% adults with diabetes     CBG: No results for input(s): "GLUCAP" in the last 168 hours.  Review of Systems:   Review of Systems  Constitutional:  Negative for fever.  Respiratory:  Negative for shortness of breath and wheezing.   Cardiovascular:  Negative for chest pain.  Gastrointestinal:  Negative for abdominal pain, blood in stool, melena, nausea and vomiting.     Past Medical History:  He,  has a past medical history of Anteroseptal myocardial infarction [facility]), Carotid artery occlusion, CHF (congestive heart failure) (HCC), Chronic kidney disease, COPD (chronic obstructive pulmonary disease) (HCC), Coronary atherosclerosis of unspecified type of vessel, native or graft, Diverticulosis, ED (erectile dysfunction), Foley catheter in place, Glaucoma, Hematuria, History of 2019 novel coronavirus disease (COVID-19) (11/2018), Hydronephrosis, bilateral, Hydroureter,  Hypercholesteremia, Hypertensive renal disease, Hypogonadism male, Hypokalemia, Obesity hypoventilation syndrome (HCC), Obesity, unspecified, OSA (obstructive sleep apnea), Peripheral vascular disease (HCC), Permanent atrial fibrillation (HCC), Phimosis, Pneumonia, Rhabdomyolysis, Stroke (HCC), Tremor, Type II or unspecified type diabetes mellitus without mention of complication, not stated as uncontrolled, Unspecified essential hypertension, Urinary incontinence, Urinary retention, and Vestibulopathy.   Surgical History:   Past Surgical History:  Procedure Laterality Date   APPENDECTOMY  1985   CARDIAC CATHETERIZATION     CHOLECYSTECTOMY  06/2000   CIRCUMCISION N/A 05/28/2020   Procedure: CIRCUMCISION ADULT;  Surgeon: Marcine Matar, MD;  Location: [facility] ORS;  Service: Urology;  Laterality: N/A;   CORONARY ANGIOPLASTY  08/14/1995   stent placement to LAD    DORSAL SLIT N/A 05/28/2020   Procedure: DORSAL SLIT;  Surgeon: Marcine Matar, MD;  Location: [facility] ORS;  Service: Urology;  Laterality: N/A;  45 MINS   ENDOVENOUS ABLATION SAPHENOUS VEIN W/ LASER Left 02/21/2018   endovenous laser ablation L GSV by Fabienne Bruns MD    INGUINAL HERNIA REPAIR Right 1985   KNEE ARTHROSCOPY Left 1991   LUMBAR FUSION     NOSE SURGERY  1970s   Per Dr. Sharyn Lull [facility] in pt chart   TRANSCAROTID ARTERY REVASCULARIZATION  Right 06/12/2020   Procedure: RIGHT TRANSCAROTID ARTERY REVASCULARIZATION;  Surgeon: Nada Libman, MD;  Location: [facility] OR;  Service: Vascular;  Laterality: Right;     Social History:   reports that he has never smoked. He has never been exposed to tobacco smoke. He has never used smokeless tobacco. He reports that he does not drink alcohol and does not use drugs.   Family History:  His family history includes Breast cancer in his maternal aunt; COPD in his mother; Colon cancer in his maternal aunt; Diabetes in his father, paternal grandmother, and sister; Glaucoma in an other family  member; Heart disease in his brother, brother, father, and mother; Ovarian cancer in his daughter.   Allergies Allergies  Allergen Reactions   Adhesive [Tape] Rash     Home Medications  Prior to Admission medications   Medication Sig Start Date End Date Taking? Authorizing Provider  acetaminophen (TYLENOL) 500 MG tablet Take 500 mg by mouth every 8 (eight) hours as needed  for moderate pain (for pain).   Yes [provider]  alum & mag hydroxide-simeth (MAALOX PLUS) 400-400-40 MG/5ML suspension Take 10 mLs by mouth every 6 (six) hours as needed for indigestion. 09/14/22  Yes Rocky Morel, DO  apixaban (ELIQUIS) 5 MG TABS tablet Take 1 tablet (5 mg total) by mouth 2 (two) times daily. 08/07/20  Yes Leeroy Bock, MD  aspirin EC 81 MG EC tablet Take 1 tablet (81 mg total) by mouth daily. Swallow whole. 06/13/20  Yes Kathlen Mody, MD  atorvastatin (LIPITOR) 40 MG tablet Take 40 mg by mouth at bedtime.   Yes [provider]  atropine 1 % ophthalmic solution Place 1 drop into the right eye daily. 06/09/22  Yes [provider]  Cholecalciferol (VITAMIN D-3) 25 MCG (1000 UT) CAPS Take 2,000 Units by mouth daily.   Yes [provider]  dorzolamide-timolol (COSOPT) 22.3-6.8 MG/ML ophthalmic solution Place 1 drop into both eyes 2 (two) times daily. For glaucoma, wait 3 mins between meds   Yes [provider]  Dulaglutide (TRULICITY) 1.5 MG/0.5ML SOPN Inject 3 mg into the skin once a week. monday   Yes [provider]  furosemide (LASIX) 20 MG tablet Take 20 mg by mouth daily. 11/17/22  Yes [provider]  gabapentin (NEURONTIN) 100 MG capsule Take 2 capsules (200 mg total) by mouth at bedtime as needed. Patient taking differently: Take 200 mg by mouth at bedtime. 08/07/20  Yes Leeroy Bock, MD  ipratropium (ATROVENT) 0.02 % nebulizer solution Take 0.5 mg by nebulization every 6 (six) hours as needed for wheezing or shortness of  breath.   Yes [provider]  latanoprost (XALATAN) 0.005 % ophthalmic solution Place 1 drop into the left eye at bedtime. 01/03/17  Yes [provider]  losartan (COZAAR) 50 MG tablet Take 50 mg by mouth in the morning and at bedtime.   Yes [provider]  magnesium oxide (MAG-OX) 400 MG tablet Take 400 mg by mouth daily.   Yes [provider]  Melatonin 5 MG TABS Take 5 mg by mouth at bedtime.   Yes [provider]  Menthol, Topical Analgesic, (BIOFREEZE) 4 % GEL Apply 1 application. topically in the morning and at bedtime. Apply to neck/shoulder   Yes [provider]  metoprolol succinate (TOPROL XL) 25 MG 24 hr tablet Take 0.5 tablets (12.5 mg total) by mouth daily. Patient taking differently: Take 25 mg by mouth daily. 06/14/20 11/27/22 Yes Kathlen Mody, MD  nitroGLYCERIN (NITROSTAT) 0.4 MG SL tablet Place 0.4 mg under the tongue every 5 (five) minutes x 3 doses as needed for chest pain (and CALL PROVIDER IF NO RELIEF AFTER THE 3RD DOSE).   Yes [provider]  pantoprazole (PROTONIX) 40 MG tablet Take 1 tablet (40 mg total) by mouth daily. 09/15/22  Yes Rocky Morel, DO  Polyethyl Glycol-Propyl Glycol (SYSTANE) 0.4-0.3 % GEL ophthalmic gel Place 1 Application into both eyes in the morning, at noon, and at bedtime.   Yes [provider]  polyethylene glycol (MIRALAX / GLYCOLAX) 17 g packet Take 17 g by mouth daily.   Yes [provider]  prednisoLONE acetate (PRED FORTE) 1 % ophthalmic suspension Place 1 drop into the right eye 2 (two) times daily. 06/09/22  Yes [provider]  sennosides-docusate sodium (SENOKOT-S) 8.6-50 MG tablet Take 2 tablets by mouth 2 (two) times daily.   Yes [provider]  sertraline (ZOLOFT) 25 MG tablet Take 25 mg by  mouth daily. 11/14/22  Yes [provider]  SPIRIVA RESPIMAT 2.5 MCG/ACT AERS Inhale 1 each into the lungs daily. For COPD 11/22/22  Yes [provider]  tamsulosin (FLOMAX) 0.4 MG CAPS capsule Take 1 capsule (0.4 mg total) by mouth daily after supper. 12/25/18  Yes Hall, Carole N, DO  VASCEPA 1 g capsule Take 1 g by mouth every evening.   Yes [provider]  albuterol (VENTOLIN HFA) 108 (90 Base) MCG/ACT inhaler Inhale 2 puffs into the lungs 2 (two) times daily as needed for wheezing or shortness of breath. 07/03/21   Marrion Coy, MD  Dextran 70-Hypromellose (ARTIFICIAL TEARS PF OP) Place 1 drop into both eyes 4 (four) times daily.    [provider]  OXYGEN Inhale 2 L into the lungs at bedtime.    [provider]  vitamin B-12 (CYANOCOBALAMIN) 1000 MCG tablet Take 500 mcg by mouth daily. Patient not taking: Reported on 11/27/2022    [provider]     Critical care time: 45 minutes    JD Anselm Lis [facility] 11/27/2022, 11:17 PM  Please see [email or web address] for pager details.  From 7A-7P if no response, please call [phone]. After hours, please call ELink [phone].

## 2022-11-27 NOTE — ED Triage Notes (Signed)
Pt BIB [facility] from [facility] with c/o AMS. Staff reports pt is not acting himself. Pt repeating himself. Pt had lab work today that shows HGB 5.2. Pt is DNR, MOST form at bedside. Pt on 3L O2 at baseline.

## 2022-11-28 ENCOUNTER — Encounter (HOSPITAL_COMMUNITY)
Admission: EM | Disposition: A | Discharge status: Skilled Nursing Facility | Payer: Self-pay | Source: Skilled Nursing Facility | Attending: Pulmonary Disease

## 2022-11-28 ENCOUNTER — Inpatient Hospital Stay (HOSPITAL_COMMUNITY): Payer: Medicare Other | Admitting: Critical Care Medicine

## 2022-11-28 ENCOUNTER — Encounter (HOSPITAL_COMMUNITY): Payer: Self-pay | Admitting: Pulmonary Disease

## 2022-11-28 DIAGNOSIS — Z7901 Long term (current) use of anticoagulants: Secondary | ICD-10-CM | POA: Diagnosis not present

## 2022-11-28 DIAGNOSIS — I4891 Unspecified atrial fibrillation: Secondary | ICD-10-CM | POA: Diagnosis not present

## 2022-11-28 DIAGNOSIS — D62 Acute posthemorrhagic anemia: Secondary | ICD-10-CM

## 2022-11-28 DIAGNOSIS — B348 Other viral infections of unspecified site: Secondary | ICD-10-CM

## 2022-11-28 DIAGNOSIS — D649 Anemia, unspecified: Secondary | ICD-10-CM | POA: Diagnosis not present

## 2022-11-28 DIAGNOSIS — K31819 Angiodysplasia of stomach and duodenum without bleeding: Secondary | ICD-10-CM

## 2022-11-28 DIAGNOSIS — G4733 Obstructive sleep apnea (adult) (pediatric): Secondary | ICD-10-CM

## 2022-11-28 DIAGNOSIS — J9611 Chronic respiratory failure with hypoxia: Secondary | ICD-10-CM

## 2022-11-28 DIAGNOSIS — K921 Melena: Secondary | ICD-10-CM | POA: Diagnosis not present

## 2022-11-28 DIAGNOSIS — I509 Heart failure, unspecified: Secondary | ICD-10-CM

## 2022-11-28 DIAGNOSIS — K5520 Angiodysplasia of colon without hemorrhage: Secondary | ICD-10-CM

## 2022-11-28 DIAGNOSIS — K922 Gastrointestinal hemorrhage, unspecified: Secondary | ICD-10-CM | POA: Diagnosis not present

## 2022-11-28 DIAGNOSIS — J449 Chronic obstructive pulmonary disease, unspecified: Secondary | ICD-10-CM | POA: Diagnosis not present

## 2022-11-28 DIAGNOSIS — I252 Old myocardial infarction: Secondary | ICD-10-CM

## 2022-11-28 DIAGNOSIS — I2510 Atherosclerotic heart disease of native coronary artery without angina pectoris: Secondary | ICD-10-CM

## 2022-11-28 DIAGNOSIS — I110 Hypertensive heart disease with heart failure: Secondary | ICD-10-CM

## 2022-11-28 DIAGNOSIS — K31811 Angiodysplasia of stomach and duodenum with bleeding: Secondary | ICD-10-CM

## 2022-11-28 HISTORY — PX: HOT HEMOSTASIS: SHX5433

## 2022-11-28 HISTORY — PX: ENTEROSCOPY: SHX5533

## 2022-11-28 LAB — BASIC METABOLIC PANEL
Anion gap: 9 (ref 5–15)
BUN: 35 mg/dL — ABNORMAL HIGH (ref 8–23)
CO2: 25 mmol/L (ref 22–32)
Calcium: 8.3 mg/dL — ABNORMAL LOW (ref 8.9–10.3)
Chloride: 104 mmol/L (ref 98–111)
Creatinine, Ser: 1.51 mg/dL — ABNORMAL HIGH (ref 0.61–1.24)
GFR, Estimated: 45 mL/min — ABNORMAL LOW (ref >60)
Glucose, Bld: 113 mg/dL — ABNORMAL HIGH (ref 70–99)
Potassium: 3.4 mmol/L — ABNORMAL LOW (ref 3.5–5.1)
Sodium: 138 mmol/L (ref 135–145)

## 2022-11-28 LAB — GLUCOSE, CAPILLARY
Glucose-Capillary: 113 mg/dL — ABNORMAL HIGH (ref 70–99)
Glucose-Capillary: 111 mg/dL — ABNORMAL HIGH (ref 70–99)
Glucose-Capillary: 101 mg/dL — ABNORMAL HIGH (ref 70–99)
Glucose-Capillary: 111 mg/dL — ABNORMAL HIGH (ref 70–99)
Glucose-Capillary: 115 mg/dL — ABNORMAL HIGH (ref 70–99)
Glucose-Capillary: 121 mg/dL — ABNORMAL HIGH (ref 70–99)
Glucose-Capillary: 127 mg/dL — ABNORMAL HIGH (ref 70–99)

## 2022-11-28 LAB — URINALYSIS, ROUTINE W REFLEX MICROSCOPIC
Bilirubin Urine: NEGATIVE
Glucose, UA: NEGATIVE mg/dL
Hgb urine dipstick: NEGATIVE
Ketones, ur: NEGATIVE mg/dL
Leukocytes,Ua: NEGATIVE
Nitrite: NEGATIVE
Protein, ur: NEGATIVE mg/dL
Specific Gravity, Urine: 1.009 (ref 1.005–1.030)
pH: 5 (ref 5.0–8.0)

## 2022-11-28 LAB — CBC
HCT: 24.6 % — ABNORMAL LOW (ref 39.0–52.0)
Hemoglobin: 7.5 g/dL — ABNORMAL LOW (ref 13.0–17.0)
MCH: 26.5 pg (ref 26.0–34.0)
MCHC: 30.5 g/dL (ref 30.0–36.0)
MCV: 86.9 fL (ref 80.0–100.0)
Platelets: 284 10*3/uL (ref 150–400)
RBC: 2.83 MIL/uL — ABNORMAL LOW (ref 4.22–5.81)
RDW: 15.9 % — ABNORMAL HIGH (ref 11.5–15.5)
WBC: 11.2 10*3/uL — ABNORMAL HIGH (ref 4.0–10.5)
nRBC: 0.5 % — ABNORMAL HIGH (ref 0.0–0.2)

## 2022-11-28 LAB — SARS CORONAVIRUS 2 BY RT PCR: SARS Coronavirus 2 by RT PCR: NEGATIVE

## 2022-11-28 LAB — RESPIRATORY PANEL BY PCR

## 2022-11-28 LAB — LACTIC ACID, PLASMA
Lactic Acid, Venous: 1.7 mmol/L (ref 0.5–1.9)
Lactic Acid, Venous: 1.2 mmol/L (ref 0.5–1.9)

## 2022-11-28 LAB — RAPID URINE DRUG SCREEN, HOSP PERFORMED
Amphetamines: NOT DETECTED
Barbiturates: NOT DETECTED
Benzodiazepines: NOT DETECTED
Cocaine: NOT DETECTED
Opiates: NOT DETECTED
Tetrahydrocannabinol: NOT DETECTED

## 2022-11-28 LAB — ETHANOL: Alcohol, Ethyl (B): <10 mg/dL (ref <10)

## 2022-11-28 LAB — MRSA NEXT GEN BY PCR, NASAL: MRSA by PCR Next Gen: NOT DETECTED

## 2022-11-28 LAB — HEMOGLOBIN AND HEMATOCRIT, BLOOD
HCT: 25.7 % — ABNORMAL LOW (ref 39.0–52.0)
HCT: 30.9 % — ABNORMAL LOW (ref 39.0–52.0)
HCT: 30.2 % — ABNORMAL LOW (ref 39.0–52.0)
Hemoglobin: 7.7 g/dL — ABNORMAL LOW (ref 13.0–17.0)
Hemoglobin: 9.1 g/dL — ABNORMAL LOW (ref 13.0–17.0)
Hemoglobin: 8.9 g/dL — ABNORMAL LOW (ref 13.0–17.0)

## 2022-11-28 LAB — MAGNESIUM: Magnesium: 2.1 mg/dL (ref 1.7–2.4)

## 2022-11-28 LAB — TYPE AND SCREEN
Antibody Screen: NEGATIVE
Unit division: 0

## 2022-11-28 LAB — AMMONIA: Ammonia: <10 umol/L (ref 9–35)

## 2022-11-28 LAB — CBG MONITORING, ED: Glucose-Capillary: 132 mg/dL — ABNORMAL HIGH (ref 70–99)

## 2022-11-28 LAB — PROTIME-INR
INR: 1.9 — ABNORMAL HIGH (ref 0.8–1.2)
Prothrombin Time: 22.4 seconds — ABNORMAL HIGH (ref 11.4–15.2)

## 2022-11-28 LAB — APTT: aPTT: 34 seconds (ref 24–36)

## 2022-11-28 LAB — BPAM RBC: Blood Product Expiration Date: 202406212359

## 2022-11-28 LAB — PROCALCITONIN: Procalcitonin: <0.1 ng/mL

## 2022-11-28 SURGERY — ENTEROSCOPY
Anesthesia: Monitor Anesthesia Care

## 2022-11-28 MED ORDER — UMECLIDINIUM BROMIDE 62.5 MCG/ACT IN AEPB
1.0000 | INHALATION_SPRAY | Freq: Every day | RESPIRATORY_TRACT | Status: DC
  Administered 2022-11-29 – 2022-12-02 (×4): 1 via RESPIRATORY_TRACT
  Filled 2022-11-28 (×2): qty 7

## 2022-11-28 MED ORDER — LACTATED RINGERS IV SOLN: INTRAVENOUS | Status: DC | PRN

## 2022-11-28 MED ORDER — ORAL CARE MOUTH RINSE: 15.0000 mL | OROMUCOSAL | Status: DC | PRN

## 2022-11-28 MED ORDER — VANCOMYCIN HCL 2000 MG/400ML IV SOLN
2000.0000 mg | Freq: Once | INTRAVENOUS | Status: AC
  Administered 2022-11-28: 2000 mg via INTRAVENOUS
  Filled 2022-11-28: qty 400

## 2022-11-28 MED ORDER — CHLORHEXIDINE GLUCONATE CLOTH 2 % EX PADS
6.0000 | MEDICATED_PAD | Freq: Every day | CUTANEOUS | Status: DC
  Administered 2022-11-28 – 2022-11-30 (×4): 6 via TOPICAL

## 2022-11-28 MED ORDER — ATROPINE SULFATE 1 % OP SOLN
1.0000 [drp] | Freq: Every day | OPHTHALMIC | Status: DC
  Administered 2022-11-28 – 2022-12-02 (×5): 1 [drp] via OPHTHALMIC
  Filled 2022-11-28: qty 2

## 2022-11-28 MED ORDER — FUROSEMIDE 10 MG/ML IJ SOLN
INTRAMUSCULAR | Status: AC
  Administered 2022-11-28: 20 mg via INTRAVENOUS
  Filled 2022-11-28: qty 2

## 2022-11-28 MED ORDER — POLYVINYL ALCOHOL 1.4 % OP SOLN
1.0000 [drp] | Freq: Four times a day (QID) | OPHTHALMIC | Status: DC
  Administered 2022-11-28 – 2022-12-02 (×16): 1 [drp] via OPHTHALMIC
  Filled 2022-11-28: qty 15

## 2022-11-28 MED ORDER — FUROSEMIDE 10 MG/ML IJ SOLN: 20.0000 mg | Freq: Once | INTRAMUSCULAR | Status: AC

## 2022-11-28 MED ORDER — PROPOFOL 10 MG/ML IV BOLUS
INTRAVENOUS | Status: DC | PRN
  Administered 2022-11-28 (×2): 10 mg via INTRAVENOUS

## 2022-11-28 MED ORDER — PREDNISOLONE ACETATE 1 % OP SUSP
1.0000 [drp] | Freq: Two times a day (BID) | OPHTHALMIC | Status: DC
  Administered 2022-11-28 – 2022-12-02 (×9): 1 [drp] via OPHTHALMIC
  Filled 2022-11-28: qty 5

## 2022-11-28 MED ORDER — PANTOPRAZOLE SODIUM 40 MG IV SOLR: 40.0000 mg | Freq: Two times a day (BID) | INTRAVENOUS | Status: DC

## 2022-11-28 MED ORDER — LATANOPROST 0.005 % OP SOLN
1.0000 [drp] | Freq: Every day | OPHTHALMIC | Status: DC
  Administered 2022-11-28 – 2022-12-01 (×4): 1 [drp] via OPHTHALMIC
  Filled 2022-11-28: qty 2.5

## 2022-11-28 MED ORDER — PIPERACILLIN-TAZOBACTAM 3.375 G IVPB
3.3750 g | Freq: Three times a day (TID) | INTRAVENOUS | Status: DC
  Administered 2022-11-28: 3.375 g via INTRAVENOUS
  Filled 2022-11-28 (×2): qty 50

## 2022-11-28 MED ORDER — POTASSIUM CHLORIDE 10 MEQ/100ML IV SOLN
10.0000 meq | INTRAVENOUS | Status: AC
  Administered 2022-11-28 (×4): 10 meq via INTRAVENOUS
  Filled 2022-11-28 (×4): qty 100

## 2022-11-28 MED ORDER — DORZOLAMIDE HCL-TIMOLOL MAL 2-0.5 % OP SOLN
1.0000 [drp] | Freq: Two times a day (BID) | OPHTHALMIC | Status: DC
  Administered 2022-11-28 – 2022-12-02 (×9): 1 [drp] via OPHTHALMIC
  Filled 2022-11-28: qty 10

## 2022-11-28 MED ORDER — PHENYLEPHRINE HCL-NACL 20-0.9 MG/250ML-% IV SOLN
INTRAVENOUS | Status: DC | PRN
  Administered 2022-11-28: 25 ug/min via INTRAVENOUS

## 2022-11-28 MED ORDER — VANCOMYCIN HCL 1250 MG/250ML IV SOLN: 1250.0000 mg | INTRAVENOUS | Status: DC

## 2022-11-28 MED ORDER — SODIUM CHLORIDE 0.9 % IV SOLN: INTRAVENOUS | Status: DC

## 2022-11-28 MED ORDER — PROPOFOL 500 MG/50ML IV EMUL
INTRAVENOUS | Status: DC | PRN
  Administered 2022-11-28: 75 ug/kg/min via INTRAVENOUS

## 2022-11-28 SURGICAL SUPPLY — 15 items

## 2022-11-28 NOTE — Anesthesia Postprocedure Evaluation (Signed)
Anesthesia Post Note  Patient: KAYZEN MARTIRE  Procedure(s) Performed: ENTEROSCOPY HOT HEMOSTASIS (ARGON PLASMA COAGULATION/BICAP)     Patient location during evaluation: PACU Anesthesia Type: MAC Level of consciousness: awake and alert Pain management: pain level controlled Vital Signs Assessment: post-procedure vital signs reviewed and stable Respiratory status: spontaneous breathing, nonlabored ventilation and respiratory function stable Cardiovascular status: blood pressure returned to baseline Postop Assessment: no apparent nausea or vomiting Anesthetic complications: no   No notable events documented.  Last Vitals:  Vitals:   11/28/22 1208 11/28/22 1215  BP: (!) 130/54 (!) 135/58  Pulse:  (P) 60  Resp:  17  Temp:  36.4 C  SpO2:  98%    Last Pain:  Vitals:   11/28/22 1215  TempSrc:   PainSc: 0-No pain                 Shanda Howells

## 2022-11-28 NOTE — H&P (Signed)
NAME:  Reginald [name], MRN:  [id number], DOB:  03/16/1936, LOS: 1 ADMISSION DATE:  11/27/2022, CONSULTATION DATE:  5/19 REFERRING MD:  Dr. Elpidio Anis, CHIEF COMPLAINT:  gi bleed   History of Present Illness:  Patient is a 87 year old male with pertinent PMH paroxysmal A-fib on Eliquis, COPD on 2 LNC, CVA with left-sided deficits, CKD stage IV, T2DM, HFpEF, HLD, HTN, CAD presents to [facility] ED on 5/19 w/ GI bleed.  Patient is a resident at [facility].  Patient recently admitted on 09/2022 for atypical chest pain thought related to GERD given improvement with PPI and maalox. Patient discharged w/ PPI. On 5/19 noted to have altered mental status and recent lab work showing Hgb 5.2.  Came to [facility] ED for further eval.  Upon arrival to [facility], patient initially normotensive.  CT head with no acute abnormality.  UA pending.  CBC here showing hemoglobin 5.9. Had episode of melena and fecal occult positive Patient typed and screened and transfused 2 units PRBCs.  Patient is on Eliquis outpatient for A-fib; last dose given this am. PPI bolus given then started on Protonix infusion. Kcentra given. Patient's BP continued to become soft while in ED. PCCM consulted for ICU admission.  Pertinent  Medical History   Past Medical History:  Diagnosis Date   Anteroseptal myocardial infarction [facility])    Carotid artery occlusion    CHF (congestive heart failure) (HCC)    Chronic kidney disease    COPD (chronic obstructive pulmonary disease) (HCC)    Coronary atherosclerosis of unspecified type of vessel, native or graft    a. s/p prior PCI to LAD in 1997   Diverticulosis    ED (erectile dysfunction)    Foley catheter in place    Glaucoma    Hematuria    History of 2019 novel coronavirus disease (COVID-19) 11/2018   Hydronephrosis, bilateral    Hydroureter    Hypercholesteremia    Hypertensive renal disease    Hypogonadism male    Hypokalemia    Obesity hypoventilation syndrome (HCC)    Obesity, unspecified     OSA (obstructive sleep apnea)    Peripheral vascular disease (HCC)    Permanent atrial fibrillation (HCC)    Phimosis    Pneumonia    Rhabdomyolysis    Stroke (HCC)    Tremor    Type II or unspecified type diabetes mellitus without mention of complication, not stated as uncontrolled    Unspecified essential hypertension    Urinary incontinence    Urinary retention    Vestibulopathy      Significant Hospital Events: Including procedures, antibiotic start and stop dates in addition to other pertinent events   5/19 admitted to [facility] w/ GI bleed on eliquis; given kcentra and PPI; pccm to admit to icu  Interim History / Subjective:  Poor historian. Presently denies any symptoms.   Objective   Blood pressure (!) 106/49, pulse 74, temperature 97.9 F (36.6 C), temperature source Oral, resp. rate 13, weight 100.4 kg, SpO2 98 %.        Intake/Output Summary (Last 24 hours) at 11/28/2022 0744 Last data filed at 11/28/2022 0520 Gross per 24 hour  Intake 1252.82 ml  Output 300 ml  Net 952.82 ml    Filed Weights   11/27/22 2253 11/27/22 2302 11/28/22 0147  Weight: 117.9 kg 126 kg 100.4 kg    Examination: General:   elderly pale appearing male in NAD HEENT: MM pink/dry; [place] in place Neuro:  Aox3 CV: s1s2, RRR,  no m/r/g PULM:  dim clear BS bilaterally; [place] 2L GI: soft, bsx4 active  Extremities: warm/dry, ble 1+ edema  Skin: no rashes or lesions appreciated  Ancillary tests personally reviewed  HB: 5.9 - increased to HB 7.5 post 1U PRBC Negative PCT Assessment & Plan:   GI bleed without hemodynamic instability. May be colonic vs PUD ABLA  on chronic anticoagulation for Afib. Chronic hypoxemic resp failure on 2 L [place]  Rhinovirus pneumonia COPD  OSA  H/o CVA  CKD stage IV Glaucoma Depression.   Plan:  - Follow hemoglobin and transfuse for HB< 7.0 - Protonix infusion - GI to see for possible endoscopy. - Keep off anticoagulation for now  - Acceptable spontaneous rate  control. - Stop antibiotics given viral pneumonia. - Continue prn bronchodilators and home CPAP  Has been hemodynamically stable and should be appropriate for transfer to PCU today.   Best Practice (right click and "Reselect all SmartList Selections" daily)   Diet/type: NPO DVT prophylaxis: SCD GI prophylaxis: PPI Lines: N/A Foley:  N/A Code Status:  full code Last date of multidisciplinary goals of care discussion [5/19 updated daughter Vivian Lions over phone. [facility] called and said the most recent paperwork they have from 09/2022 is patient is full code although there is also paperwork from 07/2021 stating patient is DNR. Daughter would like to continue full code for now.]  Lynnell Catalan, MD [facility] ICU Physician [facility] [place] [facility]  Pager: [phone] Or Epic Secure Chat After hours: [phone].  11/28/2022, 7:44 AM

## 2022-11-28 NOTE — Anesthesia Preprocedure Evaluation (Addendum)
Anesthesia Evaluation  Patient identified by MRN, date of birth, ID band Patient awake    Reviewed: Allergy & Precautions, NPO status , Patient's Chart, lab work & pertinent test results  History of Anesthesia Complications Negative for: history of anesthetic complications  Airway Mallampati: II  TM Distance: >3 FB Neck ROM: Full    Dental no notable dental hx.    Pulmonary sleep apnea and Continuous Positive Airway Pressure Ventilation , COPD,  oxygen dependent   Pulmonary exam normal        Cardiovascular hypertension, Pt. on medications + CAD, + Past MI, + Peripheral Vascular Disease and +CHF  Normal cardiovascular exam+ dysrhythmias Atrial Fibrillation      Neuro/Psych CVA    GI/Hepatic Neg liver ROS,GERD  Medicated,,melena   Endo/Other  diabetes, Type 2    Renal/GU Renal InsufficiencyRenal disease     Musculoskeletal negative musculoskeletal ROS (+)    Abdominal   Peds  Hematology  (+) Blood dyscrasia (Hgb 7.5), anemia   Anesthesia Other Findings Day of surgery medications reviewed with patient.  Reproductive/Obstetrics                              Anesthesia Physical Anesthesia Plan  ASA: 3  Anesthesia Plan: MAC   Post-op Pain Management: Minimal or no pain anticipated   Induction:   PONV Risk Score and Plan: 1 and Treatment may vary due to age or medical condition and Propofol infusion  Airway Management Planned: Natural Airway and Nasal Cannula  Additional Equipment: None  Intra-op Plan:   Post-operative Plan:   Informed Consent: I have reviewed the patients History and Physical, chart, labs and discussed the procedure including the risks, benefits and alternatives for the proposed anesthesia with the patient or authorized representative who has indicated his/her understanding and acceptance.       Plan Discussed with: CRNA  Anesthesia Plan Comments:          Anesthesia Quick Evaluation

## 2022-11-28 NOTE — Transfer of Care (Signed)
Immediate Anesthesia Transfer of Care Note  Patient: Reginald [name]  Procedure(s) Performed: ENTEROSCOPY HOT HEMOSTASIS (ARGON PLASMA COAGULATION/BICAP)  Patient Location: PACU  Anesthesia Type:General  Level of Consciousness: awake and alert   Airway & Oxygen Therapy: Patient Spontanous Breathing and Patient connected to nasal cannula oxygen  Post-op Assessment: Report given to RN and Post -op Vital signs reviewed and stable  Post vital signs: Reviewed and stable  Last Vitals:  Vitals Value Taken Time  BP 105/48 11/28/22 1154  Temp    Pulse 65 11/28/22 1159  Resp 18 11/28/22 1159  SpO2 97 % 11/28/22 1159  Vitals shown include unvalidated device data.  Last Pain:  Vitals:   11/28/22 1031  TempSrc: Temporal  PainSc: 0-No pain      Patients Stated Pain Goal: 0 (11/28/22 1000)  Complications: No notable events documented.

## 2022-11-28 NOTE — Progress Notes (Signed)
eLink Physician-Brief Progress Note Patient Name: Reginald [name] DOB: [birth year] MRN: [id number]   Date of Service  11/28/2022  HPI/Events of Note  43M SNF resident with pertinent hx of pAF on Eliquis, COPD on 2L, CKD IV who presented with AMS and found with Hg 5.9. Previously 14.5 on 09/13/22. In the ED HDS with episode of melena.   eICU Interventions  PRBC x 2 ordered Kcentra ordered Trending CBC PPI gtt GI consulted Labs for AMS pending: ammonia, EtOH, UD Admitted to ICU for close monitoring      Intervention Category Evaluation Type: New Patient Evaluation  Kieran Nachtigal Mechele Collin 11/28/2022, 1:38 AM

## 2022-11-28 NOTE — Progress Notes (Signed)
On 5/19 noted to have altered mental status and recent lab work showing Hgb 5.2.  Patient admitted for GI bleed. EGD scheduled today. Resident at [facility] place.  TOC following.

## 2022-11-28 NOTE — Progress Notes (Signed)
[facility] ADULT ICU REPLACEMENT PROTOCOL   The patient does apply for the [facility] Adult ICU Electrolyte Replacment Protocol based on the criteria listed below:   1.Exclusion criteria: TCTS, ECMO, Dialysis, and Myasthenia Gravis patients 2. Is GFR >/= 30 ml/min? Yes.    Patient's GFR today is 45 3. Is SCr </= 2? Yes.   Patient's SCr is 1.51 mg/dL 4. Did SCr increase >/= 0.5 in 24 hours? No. 5.Pt's weight >40kg  Yes.   6. Abnormal electrolyte(s): K  7. Electrolytes replaced per protocol 8.  Call MD STAT for K+ </= 2.5, Phos </= 1, or Mag </= 1 Physician:  Arnette Schaumann [facility] 11/28/2022 5:16 AM

## 2022-11-28 NOTE — Op Note (Signed)
[facility] Patient Name: Reginald [name] Procedure Date : 11/28/2022 MRN: [id number] Attending MD: Willaim Rayas. Adela Lank , MD, [id number] Date of Birth: 07/03/1936 CSN: [id number] Age: 87 Admit Type: Inpatient Procedure:                Upper GI endoscopy Indications:              Suspected upper gastrointestinal bleeding - melena,                            significant drop in Hgb. On Eliquis, treated with K                            centra overnight. Hgb responded to 2 units PRBC Providers:                Viviann Spare P. Adela Lank, MD, Fransisca Connors, Marja Kays, Technician Referring MD:              Medicines:                Monitored Anesthesia Care Complications:            No immediate complications. Estimated blood loss:                            Minimal. Estimated Blood Loss:     Estimated blood loss was minimal. Procedure:                Pre-Anesthesia Assessment:                           - Prior to the procedure, a History and Physical                            was performed, and patient medications and                            allergies were reviewed. The patient's tolerance of                            previous anesthesia was also reviewed. The risks                            and benefits of the procedure and the sedation                            options and risks were discussed with the patient.                            All questions were answered, and informed consent                            was obtained. Prior Anticoagulants: The patient has  taken Eliquis (apixaban), last dose was 1 day prior                            to procedure. ASA Grade Assessment: III - A patient                            with severe systemic disease. After reviewing the                            risks and benefits, the patient was deemed in                            satisfactory condition to undergo the  procedure.                           After obtaining informed consent, the endoscope was                            passed under direct vision. Throughout the                            procedure, the patient's blood pressure, pulse, and                            oxygen saturations were monitored continuously. The                            [id number]-H190 ([id number]) Olympus endoscope was introduced                            through the mouth, and advanced to the proximal                            jejunum. The upper GI endoscopy was accomplished                            without difficulty. The patient tolerated the                            procedure well. Scope In: Scope Out: Findings:      Esophagogastric landmarks were identified: the Z-line was found at 42       cm, the gastroesophageal junction was found at 42 cm and the upper       extent of the gastric folds was found at 42 cm from the incisors.      The exam of the esophagus was otherwise normal.      Upon initial placement of the scope there was a small amount of fresh       red blood was found in the gastric fundus and at the pylorus. This was       lavaged / removed.      A diminutive angiodysplastic lesions with no bleeding were found in the       gastric fundus. Fulguration to ablate the lesion to prevent bleeding by  argon plasma was successful.      The exam of the stomach was otherwise normal. I did not see any       pathology in the distal stomach to account for the blood there.      The was a small amount of fresh blood in the duodenal bulb and second       option of the duodenum. Following significant lavage and examination I       could not see anything actively bleeding. Two diminutive angiodysplastic       lesions were found in the second portion of the duodenum / sweep.       Fulguration to ablate the lesion to prevent bleeding by argon plasma was       successful.      The exam of the duodenum was otherwise  normal. Given no large lesions or       active bleeding noted, but clear heme in the proximal small bowel, I       removed the regular upper endoscope and replaced it with the ultraslim       pediatric colonoscope. This was able to traverse to the proximal       jejunum. I did not see any other heme or pathology to cause bleeding in       the rest of the duodenum. The duodenal sweep was angulated, multiple       passes made but nothing seen there other than diminutive AVM      The examined jejunum was normal. Benign lipoma noted. Impression:               - Esophagogastric landmarks identified.                           - Normal esophagus.                           - Red blood in the gastric fundus and in the                            pylorus which was lavaged.                           - A diminutive angiodysplastic lesions in the                            stomach. Treated with argon plasma coagulation                            (APC).                           - Two diminutive angiodysplastic lesions in the                            duodenum. Treated with argon plasma coagulation                            (APC).                           - Normal duodenum  otherwise.                           - Normal examined proximal jejunum.                           Patient had evidence of fresh blood noted in the                            stomach and proximal duodenum as outlined however                            following extensive lavage there was nothing                            actively bleeding. A few AVMs in the areas of heme                            noted as outlined - these were ablated but quite                            small. Suspect he likely did have AVM related                            bleeding causing this, although he had significant                            bleeding, would have thought something more                            substantial to have caused this. Switch  to                            enteroscopy did not show any concerning findings                            otherwise Recommendation:           - Return patient to ICU for ongoing care.                           - Clear liquid diet okay                           - Continue present medications.                           - Continue to hold Eliquis                           - Monitor Hgb and for recurrent bleeding. We will                            follow Procedure Code(s):        --- Professional ---  43255, Esophagogastroduodenoscopy, flexible,                            transoral; with control of bleeding, any method Diagnosis Code(s):        --- Professional ---                           K92.2, Gastrointestinal hemorrhage, unspecified                           K31.819, Angiodysplasia of stomach and duodenum                            without bleeding CPT copyright 2022 American [employer] Association. All rights reserved. The codes documented in this report are preliminary and upon coder review may  be revised to meet current compliance requirements. Viviann Spare P. Shalan Neault, MD 11/28/2022 11:44:39 AM This report has been signed electronically. Number of Addenda: 0

## 2022-11-28 NOTE — Consult Note (Signed)
Consultation Note   Referring Provider:  PCCM PCP: Karna Dupes, MD Primary Gastroenterologist: Gentry Fitz        Reason for consultation: Melena  DOA: 11/27/2022         Hospital Day: 2   Assessment and Plan   # 87 yo male with multiple medical problems admitted with melena and profound anemia. Hgb 5.9 ( baseline 14.5).  Received K-Centra and 2 u PRBCs. Hgb has improved to 7.5  No BMs / bleeding today.  Plan:  -Continue Pantoprazole infusion.  -Trend H/H -Will need EGD. Patient is alert and oriented but slightly drowsy. We discussed EGD. He is okay with me getting consent from family and asks that I talk with son Berna Spare. I called Berna Spare this am. The risks and benefits of EGD with possible biopsies were discussed with the patient who agrees to proceed.  -Not sure about the timing of the procedure. Possibly today Please keep NPO for now.   # A-Fib, on Eliquis.  Last dose of Eliquis was morning of 5/19   # Rhinovirus.   # COPD, on home O2  History of Present Illness Patient is a 87 y.o. year old male whose past medical history includes but is not necessarily limited to A-fib on Eliquis, COPD on home 02, glaucoma, CVA, CKD IV, DM2, HTN, HFpEF, and CAD.   Patient [facility] from [facility] yesterday with altered mental status profound anemia and melena. BP was soft. No acute findings on head CT scan. Hgb 5.9.   Admitted by PCCM. He takes Eliquis and has received K-Centra and also 2 u PRBCs. No melena or BMs since yesterday.  He denies abdominal pain, nausea or vomiting. Daily pantoprazole is listed on home med list. No NSAIDs are listed.   Labs and Imaging: Recent Labs    11/27/22 2118 11/28/22 0400  WBC 9.0 11.2*  HGB 5.9* 7.5*  HCT 21.6* 24.6*  PLT 320 284   Recent Labs    11/27/22 2118 11/28/22 0400  NA 141 138  K 3.6 3.4*  CL 105 104  CO2 26 25  GLUCOSE 136* 113*  BUN 38* 35*  CREATININE 1.56* 1.51*  CALCIUM 8.7* 8.3*    Recent Labs    11/27/22 2118  PROT 4.9*  ALBUMIN 2.4*  AST 16  ALT 17  ALKPHOS 59  BILITOT 0.5   No results for input(s): "HEPBSAG", "HCVAB", "HEPAIGM", "HEPBIGM" in the last 72 hours. Recent Labs    11/27/22 2343  LABPROT 22.4*  INR 1.9*   Principal Problem:   GI bleed Active Problems:   Anemia   Chronic obstructive pulmonary disease (HCC)   Chronic hypoxic respiratory failure (HCC)   Past Medical History:  Diagnosis Date   Anteroseptal myocardial infarction [facility])    Carotid artery occlusion    CHF (congestive heart failure) (HCC)    Chronic kidney disease    COPD (chronic obstructive pulmonary disease) (HCC)    Coronary atherosclerosis of unspecified type of vessel, native or graft    a. s/p prior PCI to LAD in 1997   Diverticulosis    ED (erectile dysfunction)    Foley catheter in place    Glaucoma    Hematuria    History of 2019 novel coronavirus  disease (COVID-19) 11/2018   Hydronephrosis, bilateral    Hydroureter    Hypercholesteremia    Hypertensive renal disease    Hypogonadism male    Hypokalemia    Obesity hypoventilation syndrome (HCC)    Obesity, unspecified    OSA (obstructive sleep apnea)    Peripheral vascular disease (HCC)    Permanent atrial fibrillation (HCC)    Phimosis    Pneumonia    Rhabdomyolysis    Stroke (HCC)    Tremor    Type II or unspecified type diabetes mellitus without mention of complication, not stated as uncontrolled    Unspecified essential hypertension    Urinary incontinence    Urinary retention    Vestibulopathy     Past Surgical History:  Procedure Laterality Date   APPENDECTOMY  1985   CARDIAC CATHETERIZATION     CHOLECYSTECTOMY  06/2000   CIRCUMCISION N/A 05/28/2020   Procedure: CIRCUMCISION ADULT;  Surgeon: Marcine Matar, MD;  Location: [facility] ORS;  Service: Urology;  Laterality: N/A;   CORONARY ANGIOPLASTY  08/14/1995   stent placement to LAD    DORSAL SLIT N/A 05/28/2020   Procedure: DORSAL  SLIT;  Surgeon: Marcine Matar, MD;  Location: [facility] ORS;  Service: Urology;  Laterality: N/A;  45 MINS   ENDOVENOUS ABLATION SAPHENOUS VEIN W/ LASER Left 02/21/2018   endovenous laser ablation L GSV by Fabienne Bruns MD    INGUINAL HERNIA REPAIR Right 1985   KNEE ARTHROSCOPY Left 1991   LUMBAR FUSION     NOSE SURGERY  1970s   Per Dr. Sharyn Lull [facility] in pt chart   TRANSCAROTID ARTERY REVASCULARIZATION  Right 06/12/2020   Procedure: RIGHT TRANSCAROTID ARTERY REVASCULARIZATION;  Surgeon: Nada Libman, MD;  Location: [facility] OR;  Service: Vascular;  Laterality: Right;    Family History  Problem Relation Age of Onset   COPD Mother    Heart disease Mother    Diabetes Father    Heart disease Father    Diabetes Sister    Heart disease Brother    Heart disease Brother    Breast cancer Maternal Aunt    Diabetes Paternal Grandmother    Colon cancer Maternal Aunt    Ovarian cancer Daughter    Glaucoma Other     Prior to Admission medications   Medication Sig Start Date End Date Taking? Authorizing Provider  acetaminophen (TYLENOL) 500 MG tablet Take 500 mg by mouth every 8 (eight) hours as needed for moderate pain (for pain).   Yes [provider]  alum & mag hydroxide-simeth (MAALOX PLUS) 400-400-40 MG/5ML suspension Take 10 mLs by mouth every 6 (six) hours as needed for indigestion. 09/14/22  Yes Rocky Morel, DO  apixaban (ELIQUIS) 5 MG TABS tablet Take 1 tablet (5 mg total) by mouth 2 (two) times daily. 08/07/20  Yes Leeroy Bock, MD  aspirin EC 81 MG EC tablet Take 1 tablet (81 mg total) by mouth daily. Swallow whole. 06/13/20  Yes Kathlen Mody, MD  atorvastatin (LIPITOR) 40 MG tablet Take 40 mg by mouth at bedtime.   Yes [provider]  atropine 1 % ophthalmic solution Place 1 drop into the right eye daily. 06/09/22  Yes [provider]  Cholecalciferol (VITAMIN D-3) 25 MCG (1000 UT) CAPS Take 2,000 Units by mouth daily.   Yes [provider]   dorzolamide-timolol (COSOPT) 22.3-6.8 MG/ML ophthalmic solution Place 1 drop into both eyes 2 (two) times daily. For glaucoma, wait 3 mins between meds   Yes [provider]  Dulaglutide (TRULICITY) 1.5 MG/0.5ML SOPN Inject 3 mg into the skin once a week. monday   Yes [provider]  furosemide (LASIX) 20 MG tablet Take 20 mg by mouth daily. 11/17/22  Yes [provider]  gabapentin (NEURONTIN) 100 MG capsule Take 2 capsules (200 mg total) by mouth at bedtime as needed. Patient taking differently: Take 200 mg by mouth at bedtime. 08/07/20  Yes Leeroy Bock, MD  ipratropium (ATROVENT) 0.02 % nebulizer solution Take 0.5 mg by nebulization every 6 (six) hours as needed for wheezing or shortness of breath.   Yes [provider]  latanoprost (XALATAN) 0.005 % ophthalmic solution Place 1 drop into the left eye at bedtime. 01/03/17  Yes [provider]  losartan (COZAAR) 50 MG tablet Take 50 mg by mouth in the morning and at bedtime.   Yes [provider]  magnesium oxide (MAG-OX) 400 MG tablet Take 400 mg by mouth daily.   Yes [provider]  Melatonin 5 MG TABS Take 5 mg by mouth at bedtime.   Yes [provider]  Menthol, Topical Analgesic, (BIOFREEZE) 4 % GEL Apply 1 application. topically in the morning and at bedtime. Apply to neck/shoulder   Yes [provider]  metoprolol succinate (TOPROL XL) 25 MG 24 hr tablet Take 0.5 tablets (12.5 mg total) by mouth daily. Patient taking differently: Take 25 mg by mouth daily. 06/14/20 11/27/22 Yes Kathlen Mody, MD  nitroGLYCERIN (NITROSTAT) 0.4 MG SL tablet Place 0.4 mg under the tongue every 5 (five) minutes x 3 doses as needed for chest pain (and CALL PROVIDER IF NO RELIEF AFTER THE 3RD DOSE).   Yes [provider]  pantoprazole (PROTONIX) 40 MG tablet Take 1 tablet (40 mg total) by mouth daily. 09/15/22  Yes Rocky Morel, DO  Polyethyl Glycol-Propyl Glycol  (SYSTANE) 0.4-0.3 % GEL ophthalmic gel Place 1 Application into both eyes in the morning, at noon, and at bedtime.   Yes [provider]  polyethylene glycol (MIRALAX / GLYCOLAX) 17 g packet Take 17 g by mouth daily.   Yes [provider]  prednisoLONE acetate (PRED FORTE) 1 % ophthalmic suspension Place 1 drop into the right eye 2 (two) times daily. 06/09/22  Yes [provider]  sennosides-docusate sodium (SENOKOT-S) 8.6-50 MG tablet Take 2 tablets by mouth 2 (two) times daily.   Yes [provider]  sertraline (ZOLOFT) 25 MG tablet Take 25 mg by mouth daily. 11/14/22  Yes [provider]  SPIRIVA RESPIMAT 2.5 MCG/ACT AERS Inhale 1 each into the lungs daily. For COPD 11/22/22  Yes [provider]  tamsulosin (FLOMAX) 0.4 MG CAPS capsule Take 1 capsule (0.4 mg total) by mouth daily after supper. 12/25/18  Yes Hall, Carole N, DO  VASCEPA 1 g capsule Take 1 g by mouth every evening.   Yes [provider]  albuterol (VENTOLIN HFA) 108 (90 Base) MCG/ACT inhaler Inhale 2 puffs into the lungs 2 (two) times daily as needed for wheezing or shortness of breath. 07/03/21   Marrion Coy, MD  Dextran 70-Hypromellose (ARTIFICIAL TEARS PF OP) Place 1 drop into both eyes 4 (four) times daily.    [provider]  OXYGEN Inhale 2 L into the lungs at bedtime.    [provider]  vitamin B-12 (CYANOCOBALAMIN) 1000 MCG tablet Take 500 mcg by mouth daily. Patient not taking: Reported on 11/27/2022    [provider]    Current Facility-Administered Medications  Medication Dose Route  Frequency Provider Last Rate Last Admin   0.9 %  sodium chloride infusion (Manually program via Guardrails IV Fluids)   Intravenous Once Mardene Sayer, MD   Held at 11/27/22 2225   0.9 %  sodium chloride infusion  250 mL Intravenous Continuous Lidia Collum, PA-C 10 mL/hr at 11/28/22 1610 Restarted at 11/28/22 0349   atropine 1 % ophthalmic solution  1 drop  1 drop Right Eye Daily Lidia Collum, PA-C       Chlorhexidine Gluconate Cloth 2 % PADS 6 each  6 each Topical Daily Icard, Bradley L, DO   6 each at 11/28/22 0132   dorzolamide-timolol (COSOPT) 2-0.5 % ophthalmic solution 1 drop  1 drop Both Eyes BID Pia Mau D, PA-C       insulin aspart (novoLOG) injection 0-6 Units  0-6 Units Subcutaneous Q4H Pia Mau D, PA-C       ipratropium-albuterol (DUONEB) 0.5-2.5 (3) MG/3ML nebulizer solution 3 mL  3 mL Nebulization Q4H PRN Lidia Collum, PA-C       latanoprost (XALATAN) 0.005 % ophthalmic solution 1 drop  1 drop Left Eye QHS Pia Mau D, PA-C       norepinephrine (LEVOPHED) 4mg  in (0.016 mg/mL) premix infusion  2-10 mcg/min Intravenous Titrated Lidia Collum, PA-C   Held at 11/28/22 0024   Oral care mouth rinse  15 mL Mouth Rinse PRN Icard, Bradley L, DO       pantoprozole (PROTONIX) 80 mg /NS 100 mL infusion  8 mg/hr Intravenous Continuous Vivien Rossetti C, MD 10 mL/hr at 11/28/22 0800 8 mg/hr at 11/28/22 0800   polyvinyl alcohol (LIQUIFILM TEARS) 1.4 % ophthalmic solution 1 drop  1 drop Both Eyes QID Pia Mau D, PA-C       potassium chloride 10 mEq in 100 mL IVPB  10 mEq Intravenous Q1 Hr x 4 Luciano Cutter, MD 80 mL/hr at 11/28/22 0838 10 mEq at 11/28/22 0838   prednisoLONE acetate (PRED FORTE) 1 % ophthalmic suspension 1 drop  1 drop Right Eye BID Lidia Collum, PA-C       umeclidinium bromide (INCRUSE ELLIPTA) 62.5 MCG/ACT 1 puff  1 puff Inhalation Daily Pia Mau D, PA-C        Allergies as of 11/27/2022 - Review Complete 11/27/2022  Allergen Reaction Noted   Adhesive [tape] Rash 08/11/2016    Social History   Socioeconomic History   Marital status: Single    Spouse name: Not on file   Number of children: 5   Years of education: Not on file   Highest education level: Not on file  Occupational History   Occupation: retired  Tobacco Use   Smoking status: Never    Passive exposure: Never   Smokeless  tobacco: Never  Vaping Use   Vaping Use: Never used  Substance and Sexual Activity   Alcohol use: No    Alcohol/week: 0.0 standard drinks of alcohol   Drug use: No   Sexual activity: Never  Other Topics Concern   Not on file  Social History Narrative   02/17/21 lives at [facility]   Social Determinants of Health   Financial Resource Strain: Not on file  Food Insecurity: No Food Insecurity (09/13/2022)   Hunger Vital Sign    Worried About Running Out of Food in the Last Year: Never true    Ran Out of Food in the Last Year: Never true  Transportation Needs: No Transportation Needs (09/13/2022)  PRAPARE - Administrator, Civil Service (Medical): No    Lack of Transportation (Non-Medical): No  Physical Activity: Not on file  Stress: Not on file  Social Connections: Not on file  Intimate Partner Violence: Not At Risk (09/13/2022)   Humiliation, Afraid, Rape, and Kick questionnaire    Fear of Current or Ex-Partner: No    Emotionally Abused: No    Physically Abused: No    Sexually Abused: No    Code Status   Code Status: Full Code  Review of Systems: All systems reviewed and negative except where noted in HPI.  Physical Exam: Vital signs in last 24 hours: Temp:  [97.5 F (36.4 C)-98.2 F (36.8 C)] 97.9 F (36.6 C) (05/20 0723) Pulse Rate:  [52-92] 52 (05/20 0800) Resp:  [12-22] 12 (05/20 0800) BP: (88-134)/(38-70) 101/70 (05/20 0800) SpO2:  [97 %-100 %] 98 % (05/20 0800) Weight:  [100.4 kg-126 kg] 100.4 kg (05/20 0147) Last BM Date : 11/28/22  General:  Drowsy male in NAD Psych:  Cooperative.  Eyes: Pupils equal Ears:  Normal auditory acuity Nose: No deformity, discharge or lesions Lungs:  Clear to auscultation.  Heart:  Regular rate, regular rhythm.  Abdomen:  Soft, nondistended, nontender, active bowel sounds, no masses felt Rectal :  Deferred Msk: Symmetrical without gross deformities.  Neurologic:  Alert, oriented, Extremities : 1+ BLE   edema    Intake/Output from previous day: 05/19 0701 - 05/20 0700 In: 1252.8 [Blood:604; IV Piggyback:648.8] Out: 300 [Urine:300] Intake/Output this shift:  Total I/O In: 234.3 [I.V.:116.9; IV Piggyback:117.3] Out: 250 [Urine:250]     Willette Cluster, NP-C @  11/28/2022, 8:51 AM

## 2022-11-28 NOTE — Progress Notes (Signed)
Pharmacy Antibiotic Note  Reginald [name] is a 87 y.o. male admitted on 11/27/2022 with GI bleed and poss LLL PNA.  Pharmacy has been consulted for Vancomycin and Zosyn dosing.  Plan: Vancomycin 2000 mg IV now, then Vancomycin 1250 mg IV q24h Zosyn 3.375 g IV q8h   Weight: 126 kg (277 lb 12.5 oz)  Temp (24hrs), Avg:97.9 F (36.6 C), Min:97.6 F (36.4 C), Max:98.2 F (36.8 C)  Recent Labs  Lab 11/27/22 2118  WBC 9.0  CREATININE 1.56*    Estimated Creatinine Clearance: 47.7 mL/min (A) (by C-G formula based on SCr of 1.56 mg/dL (H)).    Allergies  Allergen Reactions   Adhesive [Tape] Rash     Eddie Candle 11/28/2022 12:29 AM

## 2022-11-29 DIAGNOSIS — K922 Gastrointestinal hemorrhage, unspecified: Secondary | ICD-10-CM | POA: Diagnosis not present

## 2022-11-29 DIAGNOSIS — K5520 Angiodysplasia of colon without hemorrhage: Secondary | ICD-10-CM | POA: Diagnosis not present

## 2022-11-29 DIAGNOSIS — Z7901 Long term (current) use of anticoagulants: Secondary | ICD-10-CM | POA: Diagnosis not present

## 2022-11-29 DIAGNOSIS — D62 Acute posthemorrhagic anemia: Secondary | ICD-10-CM | POA: Diagnosis not present

## 2022-11-29 LAB — BASIC METABOLIC PANEL
Anion gap: 14 (ref 5–15)
BUN: 32 mg/dL — ABNORMAL HIGH (ref 8–23)
CO2: 25 mmol/L (ref 22–32)
Calcium: 8.6 mg/dL — ABNORMAL LOW (ref 8.9–10.3)
Chloride: 105 mmol/L (ref 98–111)
Creatinine, Ser: 1.63 mg/dL — ABNORMAL HIGH (ref 0.61–1.24)
GFR, Estimated: 41 mL/min — ABNORMAL LOW (ref >60)
Glucose, Bld: 139 mg/dL — ABNORMAL HIGH (ref 70–99)
Potassium: 3.5 mmol/L (ref 3.5–5.1)
Sodium: 144 mmol/L (ref 135–145)

## 2022-11-29 LAB — GLUCOSE, CAPILLARY
Glucose-Capillary: 136 mg/dL — ABNORMAL HIGH (ref 70–99)
Glucose-Capillary: 125 mg/dL — ABNORMAL HIGH (ref 70–99)
Glucose-Capillary: 107 mg/dL — ABNORMAL HIGH (ref 70–99)
Glucose-Capillary: 123 mg/dL — ABNORMAL HIGH (ref 70–99)
Glucose-Capillary: 131 mg/dL — ABNORMAL HIGH (ref 70–99)
Glucose-Capillary: 114 mg/dL — ABNORMAL HIGH (ref 70–99)

## 2022-11-29 LAB — BPAM RBC: Unit Type and Rh: 5100

## 2022-11-29 LAB — HEMOGLOBIN AND HEMATOCRIT, BLOOD
HCT: 26.1 % — ABNORMAL LOW (ref 39.0–52.0)
Hemoglobin: 7.8 g/dL — ABNORMAL LOW (ref 13.0–17.0)

## 2022-11-29 LAB — TYPE AND SCREEN: Unit division: 0

## 2022-11-29 LAB — LEGIONELLA PNEUMOPHILA SEROGP 1 UR AG: L. pneumophila Serogp 1 Ur Ag: NEGATIVE

## 2022-11-29 LAB — MAGNESIUM: Magnesium: 1.9 mg/dL (ref 1.7–2.4)

## 2022-11-29 LAB — PREPARE RBC (CROSSMATCH)

## 2022-11-29 LAB — CORTISOL: Cortisol, Plasma: 9.9 ug/dL

## 2022-11-29 MED ORDER — SODIUM CHLORIDE 0.9% IV SOLUTION: Freq: Once | INTRAVENOUS | Status: AC

## 2022-11-29 MED ORDER — ACETAMINOPHEN 10 MG/ML IV SOLN
1000.0000 mg | Freq: Once | INTRAVENOUS | Status: AC
  Administered 2022-11-29: 1000 mg via INTRAVENOUS
  Filled 2022-11-29: qty 100

## 2022-11-29 MED ORDER — ORAL CARE MOUTH RINSE: 15.0000 mL | OROMUCOSAL | Status: DC | PRN

## 2022-11-29 MED ORDER — ORAL CARE MOUTH RINSE
15.0000 mL | OROMUCOSAL | Status: DC
  Administered 2022-11-29 – 2022-12-02 (×10): 15 mL via OROMUCOSAL

## 2022-11-29 NOTE — Progress Notes (Signed)
Progress Note   Subjective  Patient denies any pain. No blood per rectum or bowel movements per nursing staff since procedure yesterday. His Hgb drifted and having some low BP, was on pressors for a bit overnight and given 1 unit PRBC today. Now off pressors. Denies complaints.   Objective   Vital signs in last 24 hours: Temp:  [97.6 F (36.4 C)-98.6 F (37 C)] 98 F (36.7 C) (05/21 1122) Pulse Rate:  [49-129] 65 (05/21 1115) Resp:  [12-23] 15 (05/21 1115) BP: (79-147)/(33-96) 96/52 (05/21 1115) SpO2:  [88 %-100 %] 96 % (05/21 1115) Weight:  [103.9 kg] 103.9 kg (05/21 0408) Last BM Date : 11/29/22 General:    white male in NAD Neurologic:  Alert and oriented,  grossly normal neurologically. Psych:  Cooperative. Normal mood and affect.  Intake/Output from previous day: 05/20 0701 - 05/21 0700 In: 1015.2 [I.V.:567.3; IV Piggyback:447.9] Out: 2350 [Urine:2350] Intake/Output this shift: Total I/O In: 397.4 [I.V.:82.4; Blood:315] Out: 300 [Urine:300]  Lab Results: Recent Labs    11/27/22 2118 11/28/22 0400 11/28/22 1013 11/28/22 1546 11/28/22 2151 11/29/22 0613  WBC 9.0 11.2*  --   --   --   --   HGB 5.9* 7.5*   < > 9.1* 8.9* 7.8*  HCT 21.6* 24.6*   < > 30.9* 30.2* 26.1*  PLT 320 284  --   --   --   --    < > = values in this interval not displayed.   BMET Recent Labs    11/27/22 2118 11/28/22 0400 11/29/22 0613  NA 141 138 144  K 3.6 3.4* 3.5  CL 105 104 105  CO2 26 25 25   GLUCOSE 136* 113* 139*  BUN 38* 35* 32*  CREATININE 1.56* 1.51* 1.63*  CALCIUM 8.7* 8.3* 8.6*   LFT Recent Labs    11/27/22 2118  PROT 4.9*  ALBUMIN 2.4*  AST 16  ALT 17  ALKPHOS 59  BILITOT 0.5   PT/INR Recent Labs    11/27/22 2343  LABPROT 22.4*  INR 1.9*    Studies/Results: DG Chest Portable 1 View  Result Date: 11/27/2022 CLINICAL DATA:  Altered level of consciousness, anemia EXAM: PORTABLE CHEST 1 VIEW COMPARISON:  09/13/2022 FINDINGS: Single frontal view  of the chest demonstrates a stable cardiac silhouette. Decreased lung volumes, with chronic central vascular congestion. Bilateral interstitial prominence and left basilar airspace disease suspicious for edema. No effusion or pneumothorax. IMPRESSION: 1. Findings most consistent with congestive heart failure. Superimposed left basilar pneumonia would be difficult to exclude. Electronically Signed   By: Sharlet Salina M.D.   On: 11/27/2022 21:42   CT Head Wo Contrast  Result Date: 11/27/2022 CLINICAL DATA:  Presents from [facility] with altered mental status. Iron-deficiency anemia. EXAM: CT HEAD WITHOUT CONTRAST TECHNIQUE: Contiguous axial images were obtained from the base of the skull through the vertex without intravenous contrast. RADIATION DOSE REDUCTION: This exam was performed according to the departmental dose-optimization program which includes automated exposure control, adjustment of the mA and/or kV according to patient size and/or use of iterative reconstruction technique. COMPARISON:  Head CT 08/09/2020 FINDINGS: Brain: There is moderately advanced global atrophy with atrophic ventriculomegaly and moderate to severe small vessel disease of the cerebral white matter. A 1 cm right parafalcine calcified meningioma is again noted along the posterosuperior falx. No mass effect. No new asymmetry is seen consistent with an acute cortical based infarct, hemorrhage, mass effect or midline shift. There are tiny chronic  bilateral gangliocapsular and left thalamic lacunar infarcts. Vascular: The carotid siphons are heavily calcified. No hyperdense central vessel. Skull: Negative for fractures or focal lesions. Sinuses/Orbits: Negative orbits. Lobular membrane thickening inferiorly in both maxillary sinuses, focally laterally in the right sphenoid air cell. Other sinuses, right mastoid air cells and both middle ears are clear. Patchy fluid interval developed in the lower left mastoid air cells. Other: None.  IMPRESSION: 1. No acute intracranial CT findings or interval changes. 2. Stable atrophy and small-vessel disease. 3. 1 cm right parafalcine calcified meningioma. 4. Sinus disease. 5. Interval development of patchy fluid in the lower left mastoid air cells. Electronically Signed   By: Almira Bar M.D.   On: 11/27/2022 21:23       Assessment / Plan:    87 y/o male here with the following:  Upper GI bleed - suspected due to AVMs Post hemorrhagic anemia Anticoagulated  Given K centra to reverse Eliquis upon admission. EGD done yesterday. Fresh blood in the stomach and duodenum - mostly in duodenum and distal stomach. Extensive lavage, a few small AVMs noted there but no active bleeding. These were ablated with APC along with small AVMs in the proximal stomach. No high risk for stigmata noted. I converted the EGD to enteroscopy and looked through the duodenum and into proximal jejunum and no blood or pathology there to cause symptoms. Suspect he likely had AVM related bleeding, a bit surprised there wasn't an obvious larger or dominant lesion with stigmata for bleeding.   BUN has downtrended, Hgb drifted and s/p 1 unit PRBC. Off pressors. He has not had a bowel movement since the procedure. Speech path consulted as patient noted to be coughing when drinking liquids last night. If cleared for PO intake would start full liquids. Otherwise holding Eliquis for now and awaiting his course. Continue IV protonix for now.  Call if any overt rebleeding, we will reassess him tomorrow.  Harlin Rain, MD [facility]

## 2022-11-29 NOTE — Progress Notes (Signed)
Pt said does not want to wear cpap at this time. Pt resting on 2l  [place].

## 2022-11-29 NOTE — Progress Notes (Signed)
eLink Physician-Brief Progress Note Patient Name: Reginald [name] DOB: 01/31/1936 MRN: [id number]   Date of Service  11/29/2022  HPI/Events of Note  87 year old male that initially presented with acute GI bleed with hemoglobin of 5.2, melena, requiring multiple units of PRBC transfusion and Eliquis reversal.  He has a history of CVA with left-sided deficits, CKD stage IV and heart failure.  He underwent EGD, stomach lesion was treated with APC, serial hemoglobins continued due to uncertainty about culprit lesion.  No new bloody bowel movements but the patient has had wide pulse pressures and had to go back on norepinephrine tonight.  Currently at levo 5.  Last hemoglobin 8.9.  eICU Interventions  Continue norepinephrine as needed.  Will readdress if he needs blood with morning labs.     Intervention Category Minor Interventions: Clinical assessment - ordering diagnostic tests  Evana Runnels 11/29/2022, 2:41 AM

## 2022-11-29 NOTE — Progress Notes (Signed)
C/o pain 8/10 coccyx area. Pain is unrelieved w/ repositioning and pillows . Notified Ermelinda Das, RN.

## 2022-11-29 NOTE — Progress Notes (Signed)
NAME:  Reginald [name], MRN:  [id number], DOB:  1935/09/07, LOS: 2 ADMISSION DATE:  11/27/2022, CONSULTATION DATE:  5/19 REFERRING MD:  Dr. Elpidio Anis, CHIEF COMPLAINT:  gi bleed   History of Present Illness:  Patient is a 87 year old male with pertinent PMH paroxysmal A-fib on Eliquis, COPD on 2 LNC, CVA with left-sided deficits, CKD stage IV, T2DM, HFpEF, HLD, HTN, CAD presents to [facility] ED on 5/19 w/ GI bleed.  Patient is a resident at [facility].  Patient recently admitted on 09/2022 for atypical chest pain thought related to GERD given improvement with PPI and maalox. Patient discharged w/ PPI. On 5/19 noted to have altered mental status and recent lab work showing Hgb 5.2.  Came to [facility] ED for further eval.  Upon arrival to [facility], patient initially normotensive.  CT head with no acute abnormality.  UA pending.  CBC here showing hemoglobin 5.9. Had episode of melena and fecal occult positive Patient typed and screened and transfused 2 units PRBCs.  Patient is on Eliquis outpatient for A-fib; last dose given this am. PPI bolus given then started on Protonix infusion. Kcentra given. Patient's BP continued to become soft while in ED. PCCM consulted for ICU admission.  Pertinent  Medical History   Past Medical History:  Diagnosis Date   Anteroseptal myocardial infarction [facility])    Carotid artery occlusion    CHF (congestive heart failure) (HCC)    Chronic kidney disease    COPD (chronic obstructive pulmonary disease) (HCC)    Coronary atherosclerosis of unspecified type of vessel, native or graft    a. s/p prior PCI to LAD in 1997   Diverticulosis    ED (erectile dysfunction)    Foley catheter in place    Glaucoma    Hematuria    History of 2019 novel coronavirus disease (COVID-19) 11/2018   Hydronephrosis, bilateral    Hydroureter    Hypercholesteremia    Hypertensive renal disease    Hypogonadism male    Hypokalemia    Obesity hypoventilation syndrome (HCC)    Obesity, unspecified     OSA (obstructive sleep apnea)    Peripheral vascular disease (HCC)    Permanent atrial fibrillation (HCC)    Phimosis    Pneumonia    Rhabdomyolysis    Stroke (HCC)    Tremor    Type II or unspecified type diabetes mellitus without mention of complication, not stated as uncontrolled    Unspecified essential hypertension    Urinary incontinence    Urinary retention    Vestibulopathy      Significant Hospital Events: Including procedures, antibiotic start and stop dates in addition to other pertinent events   5/19 admitted to [facility] w/ GI bleed on eliquis; given kcentra and PPI; pccm to admit to icu 5/20 EGD showing small non-bleeding AVMs of stomach and duodenum  Interim History / Subjective:   Patient started back on levophed overnight   Objective   Blood pressure (!) 120/38, pulse 64, temperature 98.4 F (36.9 C), temperature source Axillary, resp. rate (!) 23, weight 103.9 kg, SpO2 98 %.        Intake/Output Summary (Last 24 hours) at 11/29/2022 0752 Last data filed at 11/29/2022 0616 Gross per 24 hour  Intake 1015.2 ml  Output 2350 ml  Net -1334.8 ml   Filed Weights   11/27/22 2302 11/28/22 0147 11/29/22 0408  Weight: 126 kg 100.4 kg 103.9 kg    Examination: General:   elderly pale appearing male in NAD HEENT:  MM pink/dry; Barlow in place Neuro:  Aox3 CV: s1s2, RRR, no m/r/g PULM:  dim clear BS bilaterally; [place] 2L GI: soft, bsx4 active  Extremities: warm/dry, ble 1+ edema  Skin: no rashes or lesions appreciated  Ancillary tests personally reviewed  HB: 5.9 - increased to HB 7.5 post 1U PRBC Negative PCT Assessment & Plan:   GI bleed without hemodynamic instability. Multiple small non-bleeding AVMs ABLA  on chronic anticoagulation for Afib. Chronic hypoxemic resp failure on 2 L [place]  Rhinovirus pneumonia COPD  OSA  H/o CVA  CKD stage IV Glaucoma Depression.   Plan:  - Follow hemoglobin and transfuse for HB< 7.0 or on going vasopressor requirement -  Continue levophed for MAP 65 or greater - Give 1 unit PRBCs this morning for on going hypotension - check random cortisol level - If cortisol normal and no BP response to the blood transfusion will start midodrine - Protonix 40mg  IV BID - EGD noted with small non-bleeding AVMs of stomach and duodenum - Continue to hold anticoagulation - Continue prn bronchodilators and home CPAP   Best Practice (right click and "Reselect all SmartList Selections" daily)   Diet/type: clear liquids DVT prophylaxis: SCD GI prophylaxis: PPI Lines: N/A Foley:  N/A Code Status:  full code Last date of multidisciplinary goals of care discussion [5/19 updated daughter Alvordton Lions over phone. [facility] called and said the most recent paperwork they have from 09/2022 is patient is full code although there is also paperwork from 07/2021 stating patient is DNR. Daughter would like to continue full code for now.]  Critical Care time: 59 minutes  Melody Comas, MD [facility] Office: [phone]   See Amion for personal pager PCCM on call pager [phone] until 7pm. Please call Elink 7p-7a. [phone]

## 2022-11-29 NOTE — Evaluation (Signed)
Clinical/Bedside Swallow Evaluation Patient Details  Name: Reginald [name] MRN: [id number] Date of Birth: [birth year]  Today's Date: 11/29/2022 Time: SLP Start Time (ACUTE ONLY): 1405 SLP Stop Time (ACUTE ONLY): 1425 SLP Time Calculation (min) (ACUTE ONLY): 20 min  Past Medical History:  Past Medical History:  Diagnosis Date   Anteroseptal myocardial infarction [facility])    Carotid artery occlusion    CHF (congestive heart failure) (HCC)    Chronic kidney disease    COPD (chronic obstructive pulmonary disease) (HCC)    Coronary atherosclerosis of unspecified type of vessel, native or graft    a. s/p prior PCI to LAD in 1997   Diverticulosis    ED (erectile dysfunction)    Foley catheter in place    Glaucoma    Hematuria    History of 2019 novel coronavirus disease (COVID-19) 11/2018   Hydronephrosis, bilateral    Hydroureter    Hypercholesteremia    Hypertensive renal disease    Hypogonadism male    Hypokalemia    Obesity hypoventilation syndrome (HCC)    Obesity, unspecified    OSA (obstructive sleep apnea)    Peripheral vascular disease (HCC)    Permanent atrial fibrillation (HCC)    Phimosis    Pneumonia    Rhabdomyolysis    Stroke (HCC)    Tremor    Type II or unspecified type diabetes mellitus without mention of complication, not stated as uncontrolled    Unspecified essential hypertension    Urinary incontinence    Urinary retention    Vestibulopathy    Past Surgical History:  Past Surgical History:  Procedure Laterality Date   APPENDECTOMY  1985   CARDIAC CATHETERIZATION     CHOLECYSTECTOMY  06/2000   CIRCUMCISION N/A 05/28/2020   Procedure: CIRCUMCISION ADULT;  Surgeon: Marcine Matar, MD;  Location: [facility] ORS;  Service: Urology;  Laterality: N/A;   CORONARY ANGIOPLASTY  08/14/1995   stent placement to LAD    DORSAL SLIT N/A 05/28/2020   Procedure: DORSAL SLIT;  Surgeon: Marcine Matar, MD;  Location: [facility] ORS;  Service: Urology;  Laterality: N/A;  45  MINS   ENDOVENOUS ABLATION SAPHENOUS VEIN W/ LASER Left 02/21/2018   endovenous laser ablation L GSV by Fabienne Bruns MD    INGUINAL HERNIA REPAIR Right 1985   KNEE ARTHROSCOPY Left 1991   LUMBAR FUSION     NOSE SURGERY  1970s   Per Dr. Sharyn Lull [facility] in pt chart   TRANSCAROTID ARTERY REVASCULARIZATION  Right 06/12/2020   Procedure: RIGHT TRANSCAROTID ARTERY REVASCULARIZATION;  Surgeon: Nada Libman, MD;  Location: [facility] OR;  Service: Vascular;  Laterality: Right;   HPI:  Patient is an 87 y.o. male with PMH: MI, COPD, CHF, hematuria, obesity, OSA, PAD, a-fib, PNA, CVA with residual left sided deficits, CKD IV. He presented to the hospital from SNF on 11/27/22 with GI bleed. He was recently admitted March 2024 with atypical chest pain thought to be related to GERD. On 5/19, patient with AMS and recent lab work showing Hgb of 5.2. He was normotensive on admission, CT head negative for acute intracranial abnormality, fecal occult positive. He was seen by GI who performed EGD with fresh blood seen in stomach and duodenum. SLP swallow evaluation ordered secondary to patient noted to be coughing when drinking liquids.    Assessment / Plan / Recommendation  Clinical Impression  Patient presents with clinical s/s of dysphagia as per this bedside swallow evaluation. When asked about any prior swallowing difficulties following his stroke,  he reported "I get gagged" and that this occurs with solids and liquids. SLP had immediate cough response with small cup sip of thin liquids. With nectar thick liquids, he exhibited suspected swallow initiation delay but no coughing , even with straw sips. Patient verbalized "that's good" and indicated it felt better swallowing the thicker liquids. Puree solids (applesauce) were tolerated without observed difficulties. SLP recommending that patient is safe to start PO diet recommended by GI which is full liquids but with nectar thick liquids. SLP will follow for toleration and  ability to advance. SLP Visit Diagnosis: Dysphagia, oropharyngeal phase (R13.12);Dysphagia, unspecified (R13.10)    Aspiration Risk  Mild aspiration risk    Diet Recommendation Nectar-thick liquid   Liquid Administration via: Cup;Straw Medication Administration: Crushed with puree Supervision: Full supervision/cueing for compensatory strategies;Staff to assist with self feeding Compensations: Slow rate;Small sips/bites Postural Changes: Seated upright at 90 degrees    Other  Recommendations Oral Care Recommendations: Oral care BID;Staff/trained caregiver to provide oral care    Recommendations for follow up therapy are one component of a multi-disciplinary discharge planning process, led by the attending physician.  Recommendations may be updated based on patient status, additional functional criteria and insurance authorization.  Follow up Recommendations Skilled nursing-short term rehab (<3 hours/day)      Assistance Recommended at Discharge    Functional Status Assessment Patient has had a recent decline in their functional status and demonstrates the ability to make significant improvements in function in a reasonable and predictable amount of time.  Frequency and Duration min 2x/week  2 weeks       Prognosis Prognosis for improved oropharyngeal function: Good      Swallow Study   General Date of Onset: 11/28/22 HPI: Patient is an 87 y.o. male with PMH: MI, COPD, CHF, hematuria, obesity, OSA, PAD, a-fib, PNA, CVA with residual left sided deficits, CKD IV. He presented to the hospital from SNF on 11/27/22 with GI bleed. He was recently admitted March 2024 with atypical chest pain thought to be related to GERD. On 5/19, patient with AMS and recent lab work showing Hgb of 5.2. He was normotensive on admission, CT head negative for acute intracranial abnormality, fecal occult positive. He was seen by GI who performed EGD with fresh blood seen in stomach and duodenum. SLP swallow  evaluation ordered secondary to patient noted to be coughing when drinking liquids. Type of Study: Bedside Swallow Evaluation Previous Swallow Assessment: during previous admission Diet Prior to this Study: NPO Temperature Spikes Noted: No Respiratory Status: Nasal cannula History of Recent Intubation: No Behavior/Cognition: Alert;Cooperative;Pleasant mood Oral Cavity Assessment: Within Functional Limits Oral Care Completed by SLP: Recent completion by staff Oral Cavity - Dentition: Adequate natural dentition Vision: Functional for self-feeding Self-Feeding Abilities: Total assist Patient Positioning: Upright in bed Baseline Vocal Quality: Normal Volitional Cough: Cognitively unable to elicit Volitional Swallow: Unable to elicit    Oral/Motor/Sensory Function Overall Oral Motor/Sensory Function: Mild impairment Facial ROM: Reduced left Facial Symmetry: Abnormal symmetry left Lingual ROM: Within Functional Limits   Ice Chips Ice chips: Not tested   Thin Liquid Thin Liquid: Impaired Presentation: Cup Pharyngeal  Phase Impairments: Suspected delayed Swallow;Cough - Immediate    Nectar Thick Nectar Thick Liquid: Impaired Presentation: Cup;Straw Pharyngeal Phase Impairments: Suspected delayed Swallow   Honey Thick Honey Thick Liquid: Not tested   Puree Puree: Within functional limits Presentation: Spoon   Solid     Solid: Not tested     Angela Nevin, MA, CCC-SLP Speech  Therapy

## 2022-11-30 DIAGNOSIS — D62 Acute posthemorrhagic anemia: Secondary | ICD-10-CM | POA: Diagnosis not present

## 2022-11-30 DIAGNOSIS — K922 Gastrointestinal hemorrhage, unspecified: Secondary | ICD-10-CM | POA: Diagnosis not present

## 2022-11-30 DIAGNOSIS — K5520 Angiodysplasia of colon without hemorrhage: Secondary | ICD-10-CM | POA: Diagnosis not present

## 2022-11-30 DIAGNOSIS — Z7901 Long term (current) use of anticoagulants: Secondary | ICD-10-CM | POA: Diagnosis not present

## 2022-11-30 LAB — CBC WITH DIFFERENTIAL/PLATELET
Abs Immature Granulocytes: 0.04 10*3/uL (ref 0.00–0.07)
Basophils Absolute: 0 10*3/uL (ref 0.0–0.1)
Basophils Relative: 0 %
Eosinophils Absolute: 0.3 10*3/uL (ref 0.0–0.5)
Eosinophils Relative: 3 %
HCT: 25.1 % — ABNORMAL LOW (ref 39.0–52.0)
Hemoglobin: 7.5 g/dL — ABNORMAL LOW (ref 13.0–17.0)
Immature Granulocytes: 0 %
Lymphocytes Relative: 14 %
Lymphs Abs: 1.4 10*3/uL (ref 0.7–4.0)
MCH: 25.4 pg — ABNORMAL LOW (ref 26.0–34.0)
MCHC: 29.9 g/dL — ABNORMAL LOW (ref 30.0–36.0)
MCV: 85.1 fL (ref 80.0–100.0)
Monocytes Absolute: 1 10*3/uL (ref 0.1–1.0)
Monocytes Relative: 10 %
Neutro Abs: 7.2 10*3/uL (ref 1.7–7.7)
Neutrophils Relative %: 73 %
Platelets: 316 10*3/uL (ref 150–400)
RBC: 2.95 MIL/uL — ABNORMAL LOW (ref 4.22–5.81)
RDW: 17.7 % — ABNORMAL HIGH (ref 11.5–15.5)
WBC: 9.9 10*3/uL (ref 4.0–10.5)
nRBC: 0.2 % (ref 0.0–0.2)

## 2022-11-30 LAB — TYPE AND SCREEN
ABO/RH(D): O POS
Unit division: 0

## 2022-11-30 LAB — GLUCOSE, CAPILLARY
Glucose-Capillary: 98 mg/dL (ref 70–99)
Glucose-Capillary: 88 mg/dL (ref 70–99)
Glucose-Capillary: 184 mg/dL — ABNORMAL HIGH (ref 70–99)
Glucose-Capillary: 92 mg/dL (ref 70–99)
Glucose-Capillary: 115 mg/dL — ABNORMAL HIGH (ref 70–99)

## 2022-11-30 LAB — BASIC METABOLIC PANEL
Anion gap: 7 (ref 5–15)
BUN: 28 mg/dL — ABNORMAL HIGH (ref 8–23)
CO2: 27 mmol/L (ref 22–32)
Calcium: 8.3 mg/dL — ABNORMAL LOW (ref 8.9–10.3)
Chloride: 106 mmol/L (ref 98–111)
Creatinine, Ser: 1.61 mg/dL — ABNORMAL HIGH (ref 0.61–1.24)
GFR, Estimated: 42 mL/min — ABNORMAL LOW (ref >60)
Glucose, Bld: 130 mg/dL — ABNORMAL HIGH (ref 70–99)
Potassium: 3.1 mmol/L — ABNORMAL LOW (ref 3.5–5.1)
Sodium: 140 mmol/L (ref 135–145)

## 2022-11-30 LAB — BPAM RBC
Blood Product Expiration Date: 202405282359
ISSUE DATE / TIME: 202405192233
ISSUE DATE / TIME: 202405192233
ISSUE DATE / TIME: 202405210810
Unit Type and Rh: 5100
Unit Type and Rh: 5100

## 2022-11-30 MED ORDER — PANTOPRAZOLE SODIUM 40 MG IV SOLR
40.0000 mg | Freq: Two times a day (BID) | INTRAVENOUS | Status: DC
  Administered 2022-11-30 – 2022-12-02 (×5): 40 mg via INTRAVENOUS
  Filled 2022-11-30 (×5): qty 10

## 2022-11-30 MED ORDER — INSULIN ASPART 100 UNIT/ML IJ SOLN
0.0000 [IU] | Freq: Three times a day (TID) | INTRAMUSCULAR | Status: DC
  Administered 2022-11-30 – 2022-12-02 (×2): 1 [IU] via SUBCUTANEOUS

## 2022-11-30 MED ORDER — POTASSIUM CHLORIDE 10 MEQ/100ML IV SOLN
10.0000 meq | INTRAVENOUS | Status: AC
  Administered 2022-11-30 (×6): 10 meq via INTRAVENOUS
  Filled 2022-11-30 (×6): qty 100

## 2022-11-30 MED ORDER — POLYETHYLENE GLYCOL 3350 17 G PO PACK
17.0000 g | PACK | Freq: Once | ORAL | Status: AC
  Administered 2022-11-30: 17 g via ORAL
  Filled 2022-11-30: qty 1

## 2022-11-30 NOTE — Evaluation (Signed)
Physical Therapy Evaluation Patient Details Name: Reginald [name] MRN: [id number] DOB: 08/31/1935 Today's Date: 11/30/2022  History of Present Illness  Patient is an 87 y.o. male admitted 5/19 from SNF  with GI bleed. He was recently admitted March 2024 with atypical chest pain thought to be related to GERD. On 5/19, patient with AMS and recent lab work showing Hgb of 5.2. He was normotensive on admission, CT head negative for acute intracranial abnormality, fecal occult positive. He was seen by GI who performed EGD with fresh blood seen in stomach and duodenum. Pt also on Droplet and contact precautions. PMH: MI, COPD, CHF, hematuria, obesity, OSA, PAD, a-fib, PNA, CVA with residual left sided deficits, CKD IV.  Clinical Impression  Pt admitted with above diagnosis. Pt was able to transfer OOB with max assist of 2 scooting to drop arm chair. Pt used Stedy at facility however unit didn't have a bariatric Stedy.  Will borrow one from another unit next session. Pt tolerated transfer well and should be fine to return to facitlity when medically ready.  Pt currently with functional limitations due to the deficits listed below (see PT Problem List). Pt will benefit from acute skilled PT to increase their independence and safety with mobility to allow discharge.          Recommendations for follow up therapy are one component of a multi-disciplinary discharge planning process, led by the attending physician.  Recommendations may be updated based on patient status, additional functional criteria and insurance authorization.  Follow Up Recommendations Can patient physically be transported by private vehicle: No     Assistance Recommended at Discharge Frequent or constant Supervision/Assistance  Patient can return home with the following  Two people to help with walking and/or transfers;A lot of help with bathing/dressing/bathroom;Assistance with cooking/housework;Assist for transportation;Help with stairs  or ramp for entrance    Equipment Recommendations None recommended by PT  Recommendations for Other Services       Functional Status Assessment Patient has had a recent decline in their functional status and demonstrates the ability to make significant improvements in function in a reasonable and predictable amount of time.     Precautions / Restrictions Precautions Precautions: Fall Restrictions Weight Bearing Restrictions: No      Mobility  Bed Mobility Overal bed mobility: Needs Assistance Bed Mobility: Rolling, Sidelying to Sit Rolling: Min assist Sidelying to sit: Mod assist       General bed mobility comments: Needed assist to move LES and for trunk elevation to EOB and to use pad to scoot pt to eOB.    Transfers Overall transfer level: Needs assistance Equipment used: None Transfers: Bed to chair/wheelchair/BSC            Lateral/Scoot Transfers: Max assist, +2 physical assistance, From elevated surface General transfer comment: Dropped arm of recliner and had pt scoot to the recliner with +2 max assist with use of pad. Pt at times needing less assist but had difficulty with upright posture and leans too far anteriorly at times.    Ambulation/Gait                  Stairs            Wheelchair Mobility    Modified Rankin (Stroke Patients Only)       Balance Overall balance assessment: Needs assistance Sitting-balance support: Bilateral upper extremity supported, No upper extremity supported, Feet supported Sitting balance-Leahy Scale: Poor Sitting balance - Comments: relies on UE support to sit  EOB and at times needing min assist from staff   Standing balance support: Bilateral upper extremity supported, During functional activity Standing balance-Leahy Scale: Zero Standing balance comment: unable to stand                             Pertinent Vitals/Pain Pain Assessment Pain Assessment: No/denies pain    Home Living  Family/patient expects to be discharged to:: Skilled nursing facility                   Additional Comments: [name] x 4 years per pt    Prior Function Prior Level of Function : Needs assist             Mobility Comments: pt reports assist of 2 with use of stedy to transfer from bed to chair, is working with PT on LE exercises ADLs Comments: pt reports he can feed himself after set-up, but needs assist for any mobility or ADLs     Hand Dominance   Dominant Hand: Right    Extremity/Trunk Assessment   Upper Extremity Assessment Upper Extremity Assessment: Defer to OT evaluation    Lower Extremity Assessment Lower Extremity Assessment: RLE deficits/detail;LLE deficits/detail RLE Deficits / Details: grossly 3-/5 LLE Deficits / Details: grossly 3-/5    Cervical / Trunk Assessment Cervical / Trunk Assessment: Kyphotic  Communication   Communication: No difficulties  Cognition Arousal/Alertness: Awake/alert Behavior During Therapy: WFL for tasks assessed/performed Overall Cognitive Status: Within Functional Limits for tasks assessed                                          General Comments General comments (skin integrity, edema, etc.): VSS    Exercises General Exercises - Lower Extremity Ankle Circles/Pumps: AROM, Both, 5 reps, Supine Long Arc Quad: AROM, Both, 10 reps, Seated   Assessment/Plan    PT Assessment Patient needs continued PT services  PT Problem List Decreased activity tolerance;Decreased balance;Decreased mobility;Decreased knowledge of use of DME;Decreased safety awareness;Decreased knowledge of precautions       PT Treatment Interventions DME instruction;Gait training;Functional mobility training;Therapeutic activities;Therapeutic exercise;Balance training;Patient/family education    PT Goals (Current goals can be found in the Care Plan section)  Acute Rehab PT Goals Patient Stated Goal: to go back to facility PT  Goal Formulation: With patient Time For Goal Achievement: 12/14/22 Potential to Achieve Goals: Good    Frequency Min 2X/week     Co-evaluation               AM-PAC PT "6 Clicks" Mobility  Outcome Measure Help needed turning from your back to your side while in a flat bed without using bedrails?: A Lot Help needed moving from lying on your back to sitting on the side of a flat bed without using bedrails?: A Lot Help needed moving to and from a bed to a chair (including a wheelchair)?: Total Help needed standing up from a chair using your arms (e.g., wheelchair or bedside chair)?: Total Help needed to walk in hospital room?: Total Help needed climbing 3-5 steps with a railing? : Total 6 Click Score: 8    End of Session Equipment Utilized During Treatment: Gait belt;Oxygen Activity Tolerance: Patient limited by fatigue Patient left: in chair;with call bell/phone within reach;with chair alarm set Nurse Communication: Mobility status;Need for lift equipment PT Visit  Diagnosis: Unsteadiness on feet (R26.81);Muscle weakness (generalized) (M62.81)    Time: 5784-6962 PT Time Calculation (min) (ACUTE ONLY): 23 min   Charges:   PT Evaluation $PT Eval Moderate Complexity: 1 Mod PT Treatments $Therapeutic Activity: 8-22 mins        [facility] M,PT Acute Rehab Services [phone]   Bevelyn Buckles 11/30/2022, 2:05 PM

## 2022-11-30 NOTE — Progress Notes (Signed)
      Progress Note   Subjective  Spoke with nursing staff this AM - patient had a BM around 1 PM, green in color, no blood. HGb has drifted. He is hungry.    Objective   Vital signs in last 24 hours: Temp:  [97.9 F (36.6 C)-98.3 F (36.8 C)] 98.1 F (36.7 C) (05/22 1141) Pulse Rate:  [60-91] 65 (05/22 1300) Resp:  [14-21] 14 (05/22 1300) BP: (105-152)/(46-75) 105/54 (05/22 1300) SpO2:  [90 %-100 %] 97 % (05/22 1300) Last BM Date : 11/29/22 General:    white male in NAD Neurologic:  Alert and oriented,  grossly normal neurologically. Psych:  Cooperative. Normal mood and affect.  Intake/Output from previous day: 05/21 0701 - 05/22 0700 In: 1949.8 [P.O.:1200; I.V.:261.8; Blood:315; IV Piggyback:173] Out: 750 [Urine:750] Intake/Output this shift: Total I/O In: 733.4 [P.O.:300; I.V.:32.3; IV Piggyback:401] Out: 300 [Urine:300]  Lab Results: Recent Labs    11/27/22 2118 11/28/22 0400 11/28/22 1013 11/28/22 2151 11/29/22 0613 11/30/22 0132  WBC 9.0 11.2*  --   --   --  9.9  HGB 5.9* 7.5*   < > 8.9* 7.8* 7.5*  HCT 21.6* 24.6*   < > 30.2* 26.1* 25.1*  PLT 320 284  --   --   --  316   < > = values in this interval not displayed.   BMET Recent Labs    11/28/22 0400 11/29/22 0613 11/30/22 0132  NA 138 144 140  K 3.4* 3.5 3.1*  CL 104 105 106  CO2 25 25 27   GLUCOSE 113* 139* 130*  BUN 35* 32* 28*  CREATININE 1.51* 1.63* 1.61*  CALCIUM 8.3* 8.6* 8.3*   LFT Recent Labs    11/27/22 2118  PROT 4.9*  ALBUMIN 2.4*  AST 16  ALT 17  ALKPHOS 59  BILITOT 0.5   PT/INR Recent Labs    11/27/22 2343  LABPROT 22.4*  INR 1.9*    Studies/Results: No results found.     Assessment / Plan:    87 y/o male here with the following:   Upper GI bleed - suspected due to AVMs Post hemorrhagic anemia Anticoagulated   Given K centra to reverse Eliquis upon admission. EGD 5/20 showing fresh blood in the stomach and duodenum. A few small AVMs noted there but no  active bleeding. These were ablated with APC along with small AVMs in the proximal stomach. No high risk for stigmata noted. I converted the EGD to enteroscopy and looked through the duodenum and into proximal jejunum and no blood or pathology there to cause symptoms. Suspect he likely had AVM related bleeding, a bit surprised there wasn't an obvious larger or dominant lesion with stigmata for bleeding.    Hgb has drifted but so has BUN. He passed a green BM today does not appear to be actively bleeding. Could be re-equilibration. I think okay to advance diet, see how he does. Diet per speech path. Continue to hold Eliquis while we trend his Hgb. If any further bleeding please let me know. Continue protonix for now.  Harlin Rain, MD [facility]

## 2022-11-30 NOTE — Progress Notes (Signed)
Speech Language Pathology Treatment:    Patient Details Name: Reginald [name] MRN: [id number] DOB: Jul 02, 1936 Today's Date: 11/30/2022 Time: 4098-1191 SLP Time Calculation (min) (ACUTE ONLY): 15 min  Assessment / Plan / Recommendation Clinical Impression  Patient seen by SLP for skilled treatment focused on dysphagia goals. Patient had eyes closed by awakened easily to voice. He told SLP that he didn't sleep much last night because "my butt was hurting". He did indicate that nursing staff was able to get his pain down and currently he feels able to get some rest. He told SLP that he has enjoyed the cranberry juice and applesauce and when asked about solids, he seemed a little unsure if he was ready for or desiring that. SLP observed patient with straw sips of nectar thick juice with him managing independently. No overt s/s aspiration observed, swallow initiation continues to appear minimally delayed. He declined to have any solids, even applesauce at this time and requested to have HOB reclined some so he could rest. SLP spoke with patient's RN who was hopeful for patient to be able to have some more solid foods. SLP is recommending advancing to mechanical soft (Dys 3) solids if cleared by GI. SLP will continue to follow with likely plan for objective swallow study to r/o aspiration.   HPI HPI: Patient is an 87 y.o. male with PMH: MI, COPD, CHF, hematuria, obesity, OSA, PAD, a-fib, PNA, CVA with residual left sided deficits, CKD IV. He presented to the hospital from SNF on 11/27/22 with GI bleed. He was recently admitted March 2024 with atypical chest pain thought to be related to GERD. On 5/19, patient with AMS and recent lab work showing Hgb of 5.2. He was normotensive on admission, CT head negative for acute intracranial abnormality, fecal occult positive. He was seen by GI who performed EGD with fresh blood seen in stomach and duodenum. SLP swallow evaluation ordered secondary to patient noted to be  coughing when drinking liquids.      SLP Plan  Continue with current plan of care      Recommendations for follow up therapy are one component of a multi-disciplinary discharge planning process, led by the attending physician.  Recommendations may be updated based on patient status, additional functional criteria and insurance authorization.    Recommendations  Diet recommendations: Other(comment);Nectar-thick liquid (upgrade to mechanical soft solids when cleared by GI) Liquids provided via: Cup;Straw Medication Administration: Crushed with puree Supervision: Patient able to self feed;Full supervision/cueing for compensatory strategies (may have drinks at bedside with intermittent supervision) Compensations: Slow rate;Small sips/bites Postural Changes and/or Swallow Maneuvers: Seated upright 90 degrees                  Oral care BID;Staff/trained caregiver to provide oral care     Dysphagia, unspecified (R13.10)     Continue with current plan of care    Angela Nevin, MA, CCC-SLP Speech Therapy

## 2022-11-30 NOTE — Progress Notes (Signed)
Notified Ermelinda Das, RN of K 3.1.

## 2022-11-30 NOTE — Progress Notes (Signed)
Pt refused CPAP at this time.

## 2022-11-30 NOTE — Progress Notes (Signed)
NAME:  Reginald [name], MRN:  [id number], DOB:  21-Nov-1935, LOS: 3 ADMISSION DATE:  11/27/2022, CONSULTATION DATE:  5/19 REFERRING MD:  Dr. Elpidio Anis, CHIEF COMPLAINT:  gi bleed   History of Present Illness:  Patient is a 87 year old male with pertinent PMH paroxysmal A-fib on Eliquis, COPD on 2 LNC, CVA with left-sided deficits, CKD stage IV, T2DM, HFpEF, HLD, HTN, CAD presents to [facility] ED on 5/19 w/ GI bleed.  Patient is a resident at [facility].  Patient recently admitted on 09/2022 for atypical chest pain thought related to GERD given improvement with PPI and maalox. Patient discharged w/ PPI. On 5/19 noted to have altered mental status and recent lab work showing Hgb 5.2.  Came to [facility] ED for further eval.  Upon arrival to [facility], patient initially normotensive.  CT head with no acute abnormality.  UA pending.  CBC here showing hemoglobin 5.9. Had episode of melena and fecal occult positive Patient typed and screened and transfused 2 units PRBCs.  Patient is on Eliquis outpatient for A-fib; last dose given this am. PPI bolus given then started on Protonix infusion. Kcentra given. Patient's BP continued to become soft while in ED. PCCM consulted for ICU admission.  Pertinent  Medical History   Past Medical History:  Diagnosis Date   Anteroseptal myocardial infarction [facility])    Carotid artery occlusion    CHF (congestive heart failure) (HCC)    Chronic kidney disease    COPD (chronic obstructive pulmonary disease) (HCC)    Coronary atherosclerosis of unspecified type of vessel, native or graft    a. s/p prior PCI to LAD in 1997   Diverticulosis    ED (erectile dysfunction)    Foley catheter in place    Glaucoma    Hematuria    History of 2019 novel coronavirus disease (COVID-19) 11/2018   Hydronephrosis, bilateral    Hydroureter    Hypercholesteremia    Hypertensive renal disease    Hypogonadism male    Hypokalemia    Obesity hypoventilation syndrome (HCC)    Obesity, unspecified     OSA (obstructive sleep apnea)    Peripheral vascular disease (HCC)    Permanent atrial fibrillation (HCC)    Phimosis    Pneumonia    Rhabdomyolysis    Stroke (HCC)    Tremor    Type II or unspecified type diabetes mellitus without mention of complication, not stated as uncontrolled    Unspecified essential hypertension    Urinary incontinence    Urinary retention    Vestibulopathy      Significant Hospital Events: Including procedures, antibiotic start and stop dates in addition to other pertinent events   5/19 admitted to [facility] w/ GI bleed on eliquis; given kcentra and PPI; pccm to admit to icu 5/20 EGD showing small non-bleeding AVMs of stomach and duodenum 5/21 given 1 unit PRBCs, weaned off levophed  Interim History / Subjective:   No acute events overnight Weaned off levophed yesterday Speech therapy eval recommends nectar thick liquid with upgrade to soft solids when cleared by GI.    Objective   Blood pressure 137/70, pulse 74, temperature 98.3 F (36.8 C), temperature source Oral, resp. rate 15, weight 103.9 kg, SpO2 98 %.        Intake/Output Summary (Last 24 hours) at 11/30/2022 0712 Last data filed at 11/30/2022 0517 Gross per 24 hour  Intake 1949.79 ml  Output 750 ml  Net 1199.79 ml   Filed Weights   11/27/22 2302 11/28/22  0147 11/29/22 0408  Weight: 126 kg 100.4 kg 103.9 kg    Examination: General:   elderly pale appearing male in NAD HEENT: MM pink/dry; [place] in place Neuro:  Aox3 CV: s1s2, RRR, no m/r/g PULM:  dim clear BS bilaterally; [place] 2L GI: soft, bsx4 active  Extremities: warm/dry, ble 1+ edema  Skin: no rashes or lesions appreciated  Ancillary tests personally reviewed  HB: 5.9 - increased to HB 7.5 post 1U PRBC Negative PCT Assessment & Plan:   GI bleed without hemodynamic instability. Multiple small non-bleeding AVMs ABLA  on chronic anticoagulation for Afib. Chronic hypoxemic resp failure on 2 L [place]  Rhinovirus pneumonia COPD  OSA   H/o CVA  CKD stage IV Glaucoma Depression.   Plan:  - Follow hemoglobin and transfuse for HB< 7.0 - Consider midodrine for on going hypotension - Protonix 40mg  IV BID - EGD noted with small non-bleeding AVMs of stomach and duodenum - Continue to hold anticoagulation - Continue prn bronchodilators and home CPAP  Patient stable for transfer out of ICU  Best Practice (right click and "Reselect all SmartList Selections" daily)   Diet/type: full liquids  DVT prophylaxis: SCD GI prophylaxis: PPI Lines: N/A Foley:  N/A Code Status:  full code Last date of multidisciplinary goals of care discussion [5/19 updated daughter [place] over phone. [facility] called and said the most recent paperwork they have from 09/2022 is patient is full code although there is also paperwork from 07/2021 stating patient is DNR. Daughter would like to continue full code for now.]   Melody Comas, MD [facility] Office: [phone]   See Amion for personal pager PCCM on call pager [phone] until 7pm. Please call Elink 7p-7a. [phone]

## 2022-11-30 NOTE — Progress Notes (Signed)
Pt transported to 5W11 VSS no issues. Son [name] updated regarding transfer. Receiving RN, Rosey Bath at bedside. Belongings with pt.

## 2022-11-30 NOTE — Progress Notes (Signed)
[facility] ADULT ICU REPLACEMENT PROTOCOL   The patient does apply for the [facility] Adult ICU Electrolyte Replacment Protocol based on the criteria listed below:   1.Exclusion criteria: TCTS, ECMO, Dialysis, and Myasthenia Gravis patients 2. Is GFR >/= 30 ml/min? Yes.    Patient's GFR today is 42 3. Is SCr </= 2? Yes.   Patient's SCr is 1.61 mg/dL 4. Did SCr increase >/= 0.5 in 24 hours? No. 5.Pt's weight >40kg  Yes.   6. Abnormal electrolyte(s): k 3.1  7. Electrolytes replaced per protocol 8.  Call MD STAT for K+ </= 2.5, Phos </= 1, or Mag </= 1 Physician:    Darrick Grinder 11/30/2022 3:40 AM;l

## 2022-12-01 ENCOUNTER — Inpatient Hospital Stay (HOSPITAL_COMMUNITY): Payer: Medicare Other

## 2022-12-01 DIAGNOSIS — K5520 Angiodysplasia of colon without hemorrhage: Secondary | ICD-10-CM

## 2022-12-01 DIAGNOSIS — Z7901 Long term (current) use of anticoagulants: Secondary | ICD-10-CM

## 2022-12-01 DIAGNOSIS — K31819 Angiodysplasia of stomach and duodenum without bleeding: Secondary | ICD-10-CM | POA: Diagnosis not present

## 2022-12-01 DIAGNOSIS — D62 Acute posthemorrhagic anemia: Secondary | ICD-10-CM

## 2022-12-01 DIAGNOSIS — K922 Gastrointestinal hemorrhage, unspecified: Secondary | ICD-10-CM | POA: Diagnosis not present

## 2022-12-01 LAB — BASIC METABOLIC PANEL
Anion gap: 5 (ref 5–15)
BUN: 20 mg/dL (ref 8–23)
CO2: 27 mmol/L (ref 22–32)
Calcium: 8.4 mg/dL — ABNORMAL LOW (ref 8.9–10.3)
Chloride: 106 mmol/L (ref 98–111)
Creatinine, Ser: 1.34 mg/dL — ABNORMAL HIGH (ref 0.61–1.24)
GFR, Estimated: 52 mL/min — ABNORMAL LOW (ref >60)
Glucose, Bld: 101 mg/dL — ABNORMAL HIGH (ref 70–99)
Potassium: 3.4 mmol/L — ABNORMAL LOW (ref 3.5–5.1)
Sodium: 138 mmol/L (ref 135–145)

## 2022-12-01 LAB — CBC
HCT: 26.1 % — ABNORMAL LOW (ref 39.0–52.0)
Hemoglobin: 7.9 g/dL — ABNORMAL LOW (ref 13.0–17.0)
MCH: 25.7 pg — ABNORMAL LOW (ref 26.0–34.0)
MCHC: 30.3 g/dL (ref 30.0–36.0)
MCV: 85 fL (ref 80.0–100.0)
Platelets: 315 10*3/uL (ref 150–400)
RBC: 3.07 MIL/uL — ABNORMAL LOW (ref 4.22–5.81)
RDW: 17.1 % — ABNORMAL HIGH (ref 11.5–15.5)
WBC: 9.3 10*3/uL (ref 4.0–10.5)
nRBC: 0 % (ref 0.0–0.2)

## 2022-12-01 LAB — GLUCOSE, CAPILLARY
Glucose-Capillary: 96 mg/dL (ref 70–99)
Glucose-Capillary: 116 mg/dL — ABNORMAL HIGH (ref 70–99)
Glucose-Capillary: 96 mg/dL (ref 70–99)

## 2022-12-01 MED ORDER — POTASSIUM CHLORIDE 20 MEQ PO PACK
40.0000 meq | PACK | Freq: Once | ORAL | Status: AC
  Administered 2022-12-01: 40 meq via ORAL
  Filled 2022-12-01: qty 2

## 2022-12-01 MED ORDER — NITROGLYCERIN 0.4 MG SL SUBL: 0.4000 mg | SUBLINGUAL_TABLET | SUBLINGUAL | Status: DC | PRN

## 2022-12-01 MED ORDER — ATORVASTATIN CALCIUM 40 MG PO TABS
40.0000 mg | ORAL_TABLET | Freq: Every day | ORAL | Status: DC
  Administered 2022-12-01: 40 mg via ORAL
  Filled 2022-12-01: qty 1

## 2022-12-01 NOTE — Progress Notes (Addendum)
PROGRESS NOTE        PATIENT DETAILS Name: Reginald [name] Age: 87 y.o. Sex: male Date of Birth: 1936-02-06 Admit Date: 11/27/2022 Admitting Physician Josephine Igo, DO ZOX:[id number], Denny Levy, MD  Brief Summary: Patient is a 87 y.o.  male with history of chronic atrial fibrillation on Eliquis, COPD on 2 L of oxygen at home, CVA, CKD 4, DM-2, HTN, HFpEF, CAD-who presented with upper GI bleeding with hypotension-patient initially required pressors-subsequently admitted to the ICU, stabilized and transferred to [facility] on 5/23.  Significant events: 5/19>> admit to ICU-upper GI bleed 5/20>> EGD 5/23>> transferred to [facility]  Significant studies: 5/19>> CXR: Consistent with CHF. 5/19>> CT head: No acute process  Significant microbiology data: 5/20>> respiratory virus panel: Enterovirus 5/20>> COVID PCR: Negative  Procedures: 5/20>> EGD: Red blood gastric fundus-AVM in stomach/duodenum  Consults: GI PCCM  Subjective: Lying comfortably in bed-denies any chest pain or shortness of breath.  Dark-colored stools yesterday.  Objective: Vitals: Blood pressure (!) 142/103, pulse 74, temperature 97.6 F (36.4 C), temperature source Oral, resp. rate 15, weight 124.6 kg, SpO2 94 %.   Exam: Gen Exam:Alert awake-not in any distress HEENT:atraumatic, normocephalic Chest: B/L clear to auscultation anteriorly CVS:S1S2 regular Abdomen:soft non tender, non distended Extremities:no edema Neurology: Non focal Skin: no rash  Pertinent Labs/Radiology:    Latest Ref Rng & Units 12/01/2022    3:59 AM 11/30/2022    1:32 AM 11/29/2022    6:13 AM  CBC  WBC 4.0 - 10.5 K/uL 9.3  9.9    Hemoglobin 13.0 - 17.0 g/dL 7.9  7.5  7.8   Hematocrit 39.0 - 52.0 % 26.1  25.1  26.1   Platelets 150 - 400 K/uL 315  316      Lab Results  Component Value Date   NA 138 12/01/2022   K 3.4 (L) 12/01/2022   CL 106 12/01/2022   CO2 27 12/01/2022      Assessment/Plan: GI bleeding  with acute blood loss anemia Had some dark stools yesterday but Hb stable-suspect old blood making its way out Continue PPI Per GI-continue to hold Eliquis for a few more days  Rhinovirus pneumonia Supportive care  Chronic hypoxic respiratory failure on 2 L of oxygen at baseline COPD OSA Continue bronchodilators Continue CPAP nightly  CKD 4 Close to baseline  History of  CVA History of right TCAR for symptomatic carotid stenosis 06/12/2020 Eliquis on hold Resume statin  History of CAD No anginal symptoms Anticoagulation/antiplatelets on hold due to GI bleeding Beta-blocker on hold as BP soft Continue statin  Permanent atrial fibrillation Rate controlled Eliquis on hold-see above  Chronic HFpEF Euvolemic Resume diuretics when able  HTN BP stable/soft without the use of any antihypertensives-continue to hold losartan/metoprolol/furosemide  DM-2 (A1c 6.0 on 04/09/2021) CBG stable with SSI  History of glaucoma Continue eyedrops  Debility/deconditioning PT/OT eval-SNF recommended  Pressure Ulcer: Pressure Injury 11/29/22 Coccyx Mid Stage 2 -  Partial thickness loss of dermis presenting as a shallow open injury with a red, pink wound bed without slough. (Active)  11/29/22 1200  Location: Coccyx  Location Orientation: Mid  Staging: Stage 2 -  Partial thickness loss of dermis presenting as a shallow open injury with a red, pink wound bed without slough.  Wound Description (Comments):   Present on Admission:   Dressing Type Foam - Lift dressing to  assess site every shift 11/30/22 1931   Obesity: Estimated body mass index is 38.31 kg/m as calculated from the following:   Height as of 09/30/22: 5\' 11"  (1.803 m).   Weight as of this encounter: 124.6 kg.   Code status:   Code Status: Full Code   DVT Prophylaxis: SCDs Start: 11/27/22 2355   Family Communication: None at bedside  Disposition Plan: Status is: Inpatient Remains inpatient appropriate because:  Severity of illness   Planned Discharge Destination:Skilled nursing facility   Diet: Diet Order             DIET DYS 3 Room service appropriate? Yes; Fluid consistency: Nectar Thick  Diet effective now                     Antimicrobial agents: Anti-infectives (From admission, onward)    Start     Dose/Rate Route Frequency Ordered Stop   11/29/22 0600  vancomycin (VANCOREADY) IVPB 1250 mg/250 mL  Status:  Discontinued        1,250 mg 166.7 mL/hr over 90 Minutes Intravenous Every 24 hours 11/28/22 0033 11/28/22 0744   11/28/22 0600  piperacillin-tazobactam (ZOSYN) IVPB 3.375 g  Status:  Discontinued        3.375 g 12.5 mL/hr over 240 Minutes Intravenous Every 8 hours 11/28/22 0033 11/28/22 0743   11/28/22 0300  vancomycin (VANCOREADY) IVPB 2000 mg/400 mL        2,000 mg 200 mL/hr over 120 Minutes Intravenous  Once 11/28/22 0033 11/28/22 0348   11/27/22 2230  cefTRIAXone (ROCEPHIN) 2 g in sodium chloride 0.9 % 100 mL IVPB        2 g 200 mL/hr over 30 Minutes Intravenous  Once 11/27/22 2221 11/27/22 2313   11/27/22 2230  azithromycin (ZITHROMAX) 500 mg in sodium chloride 0.9 % 250 mL IVPB        500 mg 250 mL/hr over 60 Minutes Intravenous  Once 11/27/22 2221 11/28/22 0018        MEDICATIONS: Scheduled Meds:  sodium chloride   Intravenous Once   atropine  1 drop Right Eye Daily   Chlorhexidine Gluconate Cloth  6 each Topical Daily   dorzolamide-timolol  1 drop Both Eyes BID   insulin aspart  0-6 Units Subcutaneous TID WC   latanoprost  1 drop Left Eye QHS   mouth rinse  15 mL Mouth Rinse 4 times per day   pantoprazole (PROTONIX) IV  40 mg Intravenous Q12H   polyvinyl alcohol  1 drop Both Eyes QID   potassium chloride  40 mEq Oral Once   prednisoLONE acetate  1 drop Right Eye BID   umeclidinium bromide  1 puff Inhalation Daily   Continuous Infusions:  sodium chloride 10 mL/hr at 11/28/22 0349   PRN Meds:.ipratropium-albuterol, mouth rinse, mouth rinse   I  have personally reviewed following labs and imaging studies  LABORATORY DATA: CBC: Recent Labs  Lab 11/27/22 2118 11/28/22 0400 11/28/22 1013 11/28/22 1546 11/28/22 2151 11/29/22 0613 11/30/22 0132 12/01/22 0359  WBC 9.0 11.2*  --   --   --   --  9.9 9.3  NEUTROABS 6.8  --   --   --   --   --  7.2  --   HGB 5.9* 7.5*   < > 9.1* 8.9* 7.8* 7.5* 7.9*  HCT 21.6* 24.6*   < > 30.9* 30.2* 26.1* 25.1* 26.1*  MCV 91.5 86.9  --   --   --   --  85.1 85.0  PLT 320 284  --   --   --   --  316 315   < > = values in this interval not displayed.    Basic Metabolic Panel: Recent Labs  Lab 11/27/22 2118 11/28/22 0400 11/29/22 0613 11/30/22 0132 12/01/22 0359  NA 141 138 144 140 138  K 3.6 3.4* 3.5 3.1* 3.4*  CL 105 104 105 106 106  CO2 26 25 25 27 27   GLUCOSE 136* 113* 139* 130* 101*  BUN 38* 35* 32* 28* 20  CREATININE 1.56* 1.51* 1.63* 1.61* 1.34*  CALCIUM 8.7* 8.3* 8.6* 8.3* 8.4*  MG  --  2.1 1.9  --   --     GFR: Estimated Creatinine Clearance: 55.1 mL/min (A) (by C-G formula based on SCr of 1.34 mg/dL (H)).  Liver Function Tests: Recent Labs  Lab 11/27/22 2118  AST 16  ALT 17  ALKPHOS 59  BILITOT 0.5  PROT 4.9*  ALBUMIN 2.4*   Recent Labs  Lab 11/27/22 2118  LIPASE 31   Recent Labs  Lab 11/28/22 0400  AMMONIA <10    Coagulation Profile: Recent Labs  Lab 11/27/22 2343  INR 1.9*    Cardiac Enzymes: No results for input(s): "CKTOTAL", "CKMB", "CKMBINDEX", "TROPONINI" in the last 168 hours.  BNP (last 3 results) No results for input(s): "PROBNP" in the last 8760 hours.  Lipid Profile: No results for input(s): "CHOL", "HDL", "LDLCALC", "TRIG", "CHOLHDL", "LDLDIRECT" in the last 72 hours.  Thyroid Function Tests: No results for input(s): "TSH", "T4TOTAL", "FREET4", "T3FREE", "THYROIDAB" in the last 72 hours.  Anemia Panel: No results for input(s): "VITAMINB12", "FOLATE", "FERRITIN", "TIBC", "IRON", "RETICCTPCT" in the last 72 hours.  Urine  analysis:    Component Value Date/Time   COLORURINE STRAW (A) 11/28/2022 0520   APPEARANCEUR CLEAR 11/28/2022 0520   LABSPEC 1.009 11/28/2022 0520   PHURINE 5.0 11/28/2022 0520   GLUCOSEU NEGATIVE 11/28/2022 0520   HGBUR NEGATIVE 11/28/2022 0520   BILIRUBINUR NEGATIVE 11/28/2022 0520   KETONESUR NEGATIVE 11/28/2022 0520   PROTEINUR NEGATIVE 11/28/2022 0520   UROBILINOGEN 1.0 05/13/2015 1549   NITRITE NEGATIVE 11/28/2022 0520   LEUKOCYTESUR NEGATIVE 11/28/2022 0520    Sepsis Labs: Lactic Acid, Venous    Component Value Date/Time   LATICACIDVEN 1.2 11/28/2022 1610    MICROBIOLOGY: Recent Results (from the past 240 hour(s))  Respiratory (~20 pathogens) panel by PCR     Status: Abnormal   Collection Time: 11/28/22  1:30 AM   Specimen: Nasopharyngeal Swab; Respiratory  Result Value Ref Range Status   Adenovirus NOT DETECTED NOT DETECTED Final   Coronavirus 229E NOT DETECTED NOT DETECTED Final    Comment: (NOTE) The Coronavirus on the Respiratory Panel, DOES NOT test for the novel  Coronavirus (2019 nCoV)    Coronavirus HKU1 NOT DETECTED NOT DETECTED Final   Coronavirus NL63 NOT DETECTED NOT DETECTED Final   Coronavirus OC43 NOT DETECTED NOT DETECTED Final   Metapneumovirus NOT DETECTED NOT DETECTED Final   Rhinovirus / Enterovirus DETECTED (A) NOT DETECTED Final   Influenza A NOT DETECTED NOT DETECTED Final   Influenza B NOT DETECTED NOT DETECTED Final   Parainfluenza Virus 1 NOT DETECTED NOT DETECTED Final   Parainfluenza Virus 2 NOT DETECTED NOT DETECTED Final   Parainfluenza Virus 3 NOT DETECTED NOT DETECTED Final   Parainfluenza Virus 4 NOT DETECTED NOT DETECTED Final   Respiratory Syncytial Virus NOT DETECTED NOT DETECTED Final   Bordetella pertussis NOT DETECTED NOT DETECTED Final  Bordetella Parapertussis NOT DETECTED NOT DETECTED Final   Chlamydophila pneumoniae NOT DETECTED NOT DETECTED Final   Mycoplasma pneumoniae NOT DETECTED NOT DETECTED Final    Comment:  Performed at [facility], [address] [place], [place] [address]  MRSA Next Gen by PCR, Nasal     Status: None   Collection Time: 11/28/22  1:30 AM   Specimen: Nasal Mucosa; Nasal Swab  Result Value Ref Range Status   MRSA by PCR Next Gen NOT DETECTED NOT DETECTED Final    Comment: (NOTE) The GeneXpert MRSA Assay (FDA approved for NASAL specimens only), is one component of a comprehensive MRSA colonization surveillance program. It is not intended to diagnose MRSA infection nor to guide or monitor treatment for MRSA infections. Test performance is not FDA approved in patients less than 49 years old. Performed at [facility], [address] [place], [place] [address]   SARS Coronavirus 2 by RT PCR (hospital order, performed in [facility] hospital lab) *cepheid single result test* Anterior Nasal Swab     Status: None   Collection Time: 11/28/22  1:30 AM   Specimen: Anterior Nasal Swab  Result Value Ref Range Status   SARS Coronavirus 2 by RT PCR NEGATIVE NEGATIVE Final    Comment: Performed at [facility], [address], [place], [place] [address]    RADIOLOGY STUDIES/RESULTS: No results found.   LOS: 4 days   Jeoffrey Massed, MD  [facility]    To contact the attending provider between 7A-7P or the covering provider during after hours 7P-7A, please log into the web site [email or web address] and access using universal [place] password for that web site. If you do not have the password, please call the hospital operator.  12/01/2022, 10:13 AM

## 2022-12-01 NOTE — NC FL2 (Signed)
[place] MEDICAID FL2 LEVEL OF CARE FORM     IDENTIFICATION  Patient Name: Reginald [name] Birthdate: 1936/07/11 Sex: male Admission Date (Current Location): 11/27/2022  [facility] and [place] Number:  Producer, television/film/video and Address:  [facility] [place], [address] [place], [place] [address]      Provider Number: [id number]  Attending Physician Name and Address:  Maretta Bees, MD  Relative Name and Phone Number:       Current Level of Care: Hospital Recommended Level of Care: Skilled Nursing Facility Prior Approval Number:    Date Approved/Denied:   PASRR Number: [id number]  Discharge Plan: SNF    Current Diagnoses: Patient Active Problem List   Diagnosis Date Noted   Anemia 11/28/2022   Chronic obstructive pulmonary disease (HCC) 11/28/2022   Chronic hypoxic respiratory failure (HCC) 11/28/2022   Anemia, posthemorrhagic, acute 11/28/2022   Anticoagulated 11/28/2022   AVM (arteriovenous malformation) of small bowel, acquired 11/28/2022   AVM (arteriovenous malformation) of stomach, acquired 11/28/2022   Upper GI bleed 11/27/2022   Gastroesophageal reflux disease 09/14/2022   Atypical chest pain 09/13/2022   Chest pain 07/01/2021   History of CVA with residual deficit 07/01/2021   Pneumothorax 04/08/2021   Stroke (HCC) 08/05/2020   Vision loss of right eye 06/09/2020   Acute on chronic renal failure (HCC) 12/19/2018   Acute renal failure superimposed on stage 4 chronic kidney disease (HCC) 12/19/2018   AKI (acute kidney injury) (HCC) 11/26/2018   HCAP (healthcare-associated pneumonia) 11/15/2018   COVID-19 11/15/2018   Chronic venous insufficiency 10/04/2017   Venous stasis ulcers of both lower extremities (HCC) 10/04/2017   Nonspecific chest pain 10/27/2016   Morbid obesity (HCC) 05/02/2016   Lymphedema 05/02/2016   Abnormal laboratory test result 10/15/2015   Cough 10/08/2015   Hematuria 09/21/2015   Hypokalemia 08/13/2015    Hypertension associated with diabetes (HCC) 08/13/2015   Multiple open wounds of lower leg 07/22/2015   CKD (chronic kidney disease) stage 3, GFR 30-59 ml/min (HCC) 07/22/2015   Cellulitis of leg, right 06/28/2015   Acute kidney injury superimposed on CKD (HCC) 06/28/2015   Hypertensive heart disease with CHF (congestive heart failure) (HCC) 05/19/2015   CAD (coronary artery disease), native coronary artery 05/19/2015   Hyperlipidemia associated with type 2 diabetes mellitus (HCC) 05/19/2015   Vitamin D deficiency 05/19/2015   Pressure ulcer 05/16/2015   Diastolic dysfunction with chronic heart failure (HCC) 05/08/2015   Blisters of multiple sites 05/08/2015   Type 2 diabetes mellitus (HCC)    Obesity, unspecified    OSA (obstructive sleep apnea)    Permanent atrial fibrillation (HCC)    Secondary DM with CKD stage 4 and hypertension (HCC)    Glaucoma    Cellulitis 05/07/2015    Orientation RESPIRATION BLADDER Height & Weight     Self, Place  Normal Incontinent, External catheter Weight: 274 lb 11.1 oz (124.6 kg) Height:     BEHAVIORAL SYMPTOMS/MOOD NEUROLOGICAL BOWEL NUTRITION STATUS      Incontinent Diet (See dc summary)  AMBULATORY STATUS COMMUNICATION OF NEEDS Skin   Extensive Assist Verbally PU Stage and Appropriate Care (Stage II on coccyx)                       Personal Care Assistance Level of Assistance  Bathing, Feeding, Dressing Bathing Assistance: Maximum assistance Feeding assistance: Limited assistance Dressing Assistance: Maximum assistance     Functional Limitations Info  Sight, Hearing Sight Info:  Impaired Hearing Info: Impaired      SPECIAL CARE FACTORS FREQUENCY                       Contractures Contractures Info: Not present    Additional Factors Info  Code Status, Allergies, Isolation Precautions Code Status Info: Full Allergies Info: Adhesive tape     Isolation Precautions Info: Rhinovirus + 5/20     Current Medications  (12/01/2022):  This is the current hospital active medication list Current Facility-Administered Medications  Medication Dose Route Frequency Provider Last Rate Last Admin   0.9 %  sodium chloride infusion (Manually program via Guardrails IV Fluids)   Intravenous Once Mardene Sayer, MD   Held at 11/27/22 2225   0.9 %  sodium chloride infusion  250 mL Intravenous Continuous Lidia Collum, PA-C 10 mL/hr at 11/28/22 0349 Restarted at 11/28/22 0349   atorvastatin (LIPITOR) tablet 40 mg  40 mg Oral QHS Ghimire, Werner Lean, MD       atropine 1 % ophthalmic solution 1 drop  1 drop Right Eye Daily Lidia Collum, PA-C   1 drop at 11/30/22 1005   dorzolamide-timolol (COSOPT) 2-0.5 % ophthalmic solution 1 drop  1 drop Both Eyes BID Pia Mau D, PA-C   1 drop at 11/30/22 2214   insulin aspart (novoLOG) injection 0-6 Units  0-6 Units Subcutaneous TID WC Martina Sinner, MD   1 Units at 11/30/22 1146   ipratropium-albuterol (DUONEB) 0.5-2.5 (3) MG/3ML nebulizer solution 3 mL  3 mL Nebulization Q4H PRN Lidia Collum, PA-C       latanoprost (XALATAN) 0.005 % ophthalmic solution 1 drop  1 drop Left Eye QHS Lidia Collum, PA-C   1 drop at 11/30/22 2214   nitroGLYCERIN (NITROSTAT) SL tablet 0.4 mg  0.4 mg Sublingual Q5 Min x 3 PRN Maretta Bees, MD       Oral care mouth rinse  15 mL Mouth Rinse PRN Icard, Rachel Bo, DO       Oral care mouth rinse  15 mL Mouth Rinse 4 times per day Martina Sinner, MD   15 mL at 12/01/22 8295   Oral care mouth rinse  15 mL Mouth Rinse PRN Martina Sinner, MD       pantoprazole (PROTONIX) injection 40 mg  40 mg Intravenous Q12H Martina Sinner, MD   40 mg at 11/30/22 2213   polyvinyl alcohol (LIQUIFILM TEARS) 1.4 % ophthalmic solution 1 drop  1 drop Both Eyes QID Pia Mau D, PA-C   1 drop at 11/30/22 2215   potassium chloride (KLOR-CON) packet 40 mEq  40 mEq Oral Once Maretta Bees, MD       prednisoLONE acetate (PRED FORTE) 1 % ophthalmic suspension 1  drop  1 drop Right Eye BID Pia Mau D, PA-C   1 drop at 11/30/22 2215   umeclidinium bromide (INCRUSE ELLIPTA) 62.5 MCG/ACT 1 puff  1 puff Inhalation Daily Lidia Collum, PA-C   1 puff at 12/01/22 6213     Discharge Medications: Please see discharge summary for a list of discharge medications.  Relevant Imaging Results:  Relevant Lab Results:   Additional Information [id number]  Mearl Latin, LCSW

## 2022-12-01 NOTE — Evaluation (Signed)
Occupational Therapy Evaluation Patient Details Name: Reginald [name] MRN: [id number] DOB: [birth year] Today's Date: 12/01/2022   History of Present Illness Patient is an 87 y.o. male admitted 5/19 from SNF  with GI bleed. He was recently admitted March 2024 with atypical chest pain thought to be related to GERD. On 5/19, patient with AMS and recent lab work showing Hgb of 5.2. He was normotensive on admission, CT head negative for acute intracranial abnormality, fecal occult positive. He was seen by GI who performed EGD with fresh blood seen in stomach and duodenum. Pt also on Droplet and contact precautions. PMH: MI, COPD, CHF, hematuria, obesity, OSA, PAD, a-fib, PNA, CVA with residual left sided deficits, CKD IV.   Clinical Impression   At baseline, pt requires Set up to Min/Mod assist with UB ADLs, Total assist with LB ADLs, and use of bariatric Stedy lift with +2 assist for functional transfers. Pt presents with decreased activity tolerance, decreased general B UE strength, decreased activity tolerance, decreased sitting balance during functional tasks, and decreased safety and independence with UB ADLs. Pt currently demonstrates ability to complete UB ADLs with Set up to Max assist and is at baseline PLOF for LB ADLs and functional transfers. Pt will benefit from acute skilled OT services to address deficits outlined below, to increase pt safety and independence with UB ADLs, and to decrease caregiver burden. Post acute discharge, pt will benefit from intensive inpatient skilled OT services < 3 hours per day in order to maximize rehab potential and return to baseline.     Recommendations for follow up therapy are one component of a multi-disciplinary discharge planning process, led by the attending physician.  Recommendations may be updated based on patient status, additional functional criteria and insurance authorization.   Assistance Recommended at Discharge Frequent or constant  Supervision/Assistance  Patient can return home with the following Two people to help with walking and/or transfers;A lot of help with bathing/dressing/bathroom;Assistance with cooking/housework;Assistance with feeding;Direct supervision/assist for medications management;Direct supervision/assist for financial management;Assist for transportation;Help with stairs or ramp for entrance    Functional Status Assessment  Patient has had a recent decline in their functional status and demonstrates the ability to make significant improvements in function in a reasonable and predictable amount of time.  Equipment Recommendations  None recommended by OT    Recommendations for Other Services       Precautions / Restrictions Precautions Precautions: Fall Restrictions Weight Bearing Restrictions: No      Mobility Bed Mobility Overal bed mobility: Needs Assistance Bed Mobility: Rolling, Sidelying to Sit Rolling: Min assist Sidelying to sit: Mod assist            Transfers Overall transfer level: Needs assistance                 General transfer comment: At baseline, pt requires bariatric Stedy lift with assist +2 for functional transfers.      Balance Overall balance assessment: Needs assistance Sitting-balance support: Bilateral upper extremity supported, Feet supported Sitting balance-Leahy Scale: Poor     Standing balance support: Bilateral upper extremity supported, During functional activity Standing balance-Leahy Scale: Zero Standing balance comment: Pt unable to stand.                           ADL either performed or assessed with clinical judgement   ADL Overall ADL's : Needs assistance/impaired Eating/Feeding: Set up   Grooming: Minimal assistance;Bed level;Cueing for sequencing   Upper  Body Bathing: Maximal assistance;Cueing for sequencing;Cueing for compensatory techniques;Bed level   Lower Body Bathing: Total assistance;Bed level   Upper  Body Dressing : Moderate assistance;Cueing for compensatory techniques;Cueing for sequencing;Bed level   Lower Body Dressing: Total assistance;Bed level       Toileting- Clothing Manipulation and Hygiene: Total assistance;Bed level         General ADL Comments: Pt near baseline PLOF for safety and independence with ADLs.     Vision Baseline Vision/History: 1 Wears glasses;2 Legally blind (Legally blin in Left eye; Wears bifocals for Right eye vision) Ability to See in Adequate Light: 0 Adequate (Adequate on Right; Severely impaired on Left) Patient Visual Report: No change from baseline;Other (comment) (Vision unchanged since prior CVA)       Perception     Praxis Praxis Praxis tested?: Deficits Deficits: Organization    Pertinent Vitals/Pain Pain Assessment Pain Assessment: No/denies pain     Hand Dominance Right   Extremity/Trunk Assessment Upper Extremity Assessment Upper Extremity Assessment: Generalized weakness;LUE deficits/detail LUE Deficits / Details: AROM/PROM shoulder flexion and abduction to approx 90 degrees (Pt reports shoulder ROM is at baseline.) LUE Sensation: decreased proprioception LUE Coordination: decreased fine motor   Lower Extremity Assessment Lower Extremity Assessment: Defer to PT evaluation   Cervical / Trunk Assessment Cervical / Trunk Assessment: Kyphotic   Communication Communication Communication: No difficulties   Cognition Arousal/Alertness: Awake/alert Behavior During Therapy: WFL for tasks assessed/performed Overall Cognitive Status: No family/caregiver present to determine baseline cognitive functioning                                 General Comments: Pt is AAOx4. Pt presents with need for increased time for processing and verbal cues for sequencing. Pt likely at or near baseline cognitive level.     General Comments  VSS on 2L continuous O2 through nasal cannula throught session.    Exercises      Shoulder Instructions      Home Living Family/patient expects to be discharged to:: Skilled nursing facility                                 Additional Comments: [name] x 4 years per pt      Prior Functioning/Environment Prior Level of Function : Needs assist             Mobility Comments: Pt reports assist of 2 with use of stedy to transfer from bed to chair, is working with PT on LE exercises ADLs Comments: Pt reports he typically compltes self feeding with Set up, UB dressing/bathing with Min to Mod assist, and LB ADLs with Total assist in the bed.        OT Problem List: Decreased strength;Decreased activity tolerance;Impaired balance (sitting and/or standing);Decreased coordination;Decreased safety awareness;Impaired sensation;Other (comment) (Decreased activity tolerance)      OT Treatment/Interventions: Self-care/ADL training;Therapeutic exercise;DME and/or AE instruction;Therapeutic activities;Patient/family education;Balance training    OT Goals(Current goals can be found in the care plan section) Acute Rehab OT Goals Patient Stated Goal: To return to SNF OT Goal Formulation: With patient Time For Goal Achievement: 12/15/22 Potential to Achieve Goals: Good ADL Goals Pt Will Perform Grooming: with min guard assist;sitting Pt Will Perform Upper Body Bathing: with min assist;sitting Pt Will Perform Upper Body Dressing: with min assist;sitting  OT Frequency: Min 2X/week    Co-evaluation  AM-PAC OT "6 Clicks" Daily Activity     Outcome Measure Help from another person eating meals?: A Little Help from another person taking care of personal grooming?: A Little Help from another person toileting, which includes using toliet, bedpan, or urinal?: Total Help from another person bathing (including washing, rinsing, drying)?: A Lot Help from another person to put on and taking off regular upper body clothing?: A Lot Help from  another person to put on and taking off regular lower body clothing?: Total 6 Click Score: 12   End of Session Equipment Utilized During Treatment: Oxygen Nurse Communication: Mobility status  Activity Tolerance: Patient tolerated treatment well Patient left: in bed;with call bell/phone within reach;with bed alarm set  OT Visit Diagnosis: Muscle weakness (generalized) (M62.81);Other (comment) (Decreased activity tolerance; Decreased sitting balance during functional tasks.)                Time: 1610-9604 OT Time Calculation (min): 32 min Charges:  OT General Charges $OT Visit: 1 Visit OT Evaluation $OT Eval Moderate Complexity: 1 Mod OT Treatments $Self Care/Home Management : 8-22 mins  Traniyah Hallett "Orson Eva., OTR/L, MA Acute Rehab [phone]   Lendon Colonel 12/01/2022, 5:50 PM

## 2022-12-01 NOTE — Progress Notes (Signed)
      Progress Note   Subjective  Patient doing okay. Passed some dark stool yesterday but sounds like it is old. Hgb stable, BUN normalized. Denies complaints.   Objective   Vital signs in last 24 hours: Temp:  [97.6 F (36.4 C)-98.6 F (37 C)] 97.6 F (36.4 C) (05/23 0742) Pulse Rate:  [58-91] 74 (05/23 0400) Resp:  [14-21] 15 (05/23 0400) BP: (105-149)/(53-103) 142/103 (05/23 0742) SpO2:  [90 %-99 %] 96 % (05/23 0400) Weight:  [124.6 kg] 124.6 kg (05/23 0400) Last BM Date : 11/29/22 General:    white male in NAD Neurologic:  Alert and oriented,  grossly normal neurologically. Psych:  Cooperative. Normal mood and affect.  Intake/Output from previous day: 05/22 0701 - 05/23 0700 In: 1493.4 [P.O.:1060; I.V.:32.3; IV Piggyback:401] Out: 4150 [Urine:4150] Intake/Output this shift: No intake/output data recorded.  Lab Results: Recent Labs    11/29/22 0613 11/30/22 0132 12/01/22 0359  WBC  --  9.9 9.3  HGB 7.8* 7.5* 7.9*  HCT 26.1* 25.1* 26.1*  PLT  --  316 315   BMET Recent Labs    11/29/22 0613 11/30/22 0132 12/01/22 0359  NA 144 140 138  K 3.5 3.1* 3.4*  CL 105 106 106  CO2 25 27 27   GLUCOSE 139* 130* 101*  BUN 32* 28* 20  CREATININE 1.63* 1.61* 1.34*  CALCIUM 8.6* 8.3* 8.4*   LFT No results for input(s): "PROT", "ALBUMIN", "AST", "ALT", "ALKPHOS", "BILITOT", "BILIDIR", "IBILI" in the last 72 hours. PT/INR No results for input(s): "LABPROT", "INR" in the last 72 hours.  Studies/Results: No results found.     Assessment / Plan:    87 y/o male here with the following:  Upper GI bleed - suspected due to AVMs Post hemorrhagic anemia Anticoagulated   Given K centra to reverse Eliquis upon admission. EGD 5/20 showing fresh blood in the stomach and duodenum. A few small AVMs noted there but no active bleeding. These were ablated with APC along with small AVMs in the proximal stomach. No high risk for stigmata noted. I converted the EGD to  enteroscopy and looked through the duodenum and into proximal jejunum and no blood or pathology there to cause symptoms. Suspect he likely had AVM related bleeding, a bit surprised there wasn't an obvious larger or dominant lesion with stigmata for bleeding.   Hgb stable overnight. BUN continues to downtrend. Suspect BM yesterday was old blood. Diet per speech path. Would hold Eliquis a few more days and then resume. Continue protonix.  No new recs at this time, we will sign off. Call if questions or any recurrent bleeding.  Harlin Rain, MD [facility]

## 2022-12-01 NOTE — Procedures (Signed)
Modified Barium Swallow Study  Patient Details  Name: Reginald [name] MRN: [id number] Date of Birth: 1936/02/16  Today's Date: 12/01/2022  Modified Barium Swallow completed.  Full report located under Chart Review in the Imaging Section.  History of Present Illness Patient is an 87 y.o. male with PMH: MI, COPD, CHF, hematuria, obesity, OSA, PAD, a-fib, PNA, CVA with residual left sided deficits, CKD IV. He presented to the hospital from SNF on 11/27/22 with GI bleed. He was recently admitted March 2024 with atypical chest pain thought to be related to GERD. On 5/19, patient with AMS and recent lab work showing Hgb of 5.2. He was normotensive on admission, CT head negative for acute intracranial abnormality, fecal occult positive. He was seen by GI who performed EGD with fresh blood seen in stomach and duodenum. SLP swallow evaluation ordered secondary to patient noted to be coughing when drinking liquids.   Clinical Impression Patient presents with a mild oropharyngeal dysphagia as per this MBS. During oral phase, patient exhibited prolonged mastication and delayed anterior to posterior transit of mechanical soft solids bolus but full clearance from oral cavity. Pharyngeal phase impairment appeared secondary to only partial hyoid excursion and partial laryngeal elevation. Epiglottic inversion was complete, however. In addition, patient with swallow initiation delay consistently to pyriform sinus (likely due to decreased sensation) with thin, nectar and honey thick liquids and head of bolus heading into pyriform sinus with puree solids and mechanical soft solids before swallow was initiated. Only trace amount of residuals observed in vallecular sinus post initial swallows with all tested consistencies. Patient had difficulty coordinating swallow when attempting 13 mm barium tablet with thin liquids and this led to trace sensed aspiration of thin liquid. Another instance of sensed aspiration occured when  patient drinking large sip of thin liquids, with trace amount of thin liquid spilling over from pyriform sinus into open airway. Trace, flash penetration (PAS 2) occurred with nectar thick liquids but even with large sips, penetration was shallow/well above vocal cords, fully exited laryngeal vestibule and did not result in any aspiration. SLP is recommending to continue with Dys 3 (mechanical soft) solids and nectar thick liquids for meals but patient may have thin liquids in between meals (no other PO's when drinking thins) via cup sips (no straws). Factors that may increase risk of adverse event in presence of aspiration Reginald [name] 2021): Limited mobility;Dependence for feeding and/or oral hygiene  Swallow Evaluation Recommendations Recommendations: PO diet PO Diet Recommendation: Dysphagia 3 (Mechanical soft);Mildly thick liquids (Level 2, nectar thick) Liquid Administration via: Cup;Straw Medication Administration: Whole meds with puree Supervision: Patient able to self-feed;Full supervision/cueing for swallowing strategies;Staff to assist with self-feeding Swallowing strategies  : Slow rate;Small bites/sips Postural changes: Position pt fully upright for meals Oral care recommendations: Oral care BID (2x/day);Staff/trained caregiver to provide oral care    Angela Nevin, MA, CCC-SLP Speech Therapy

## 2022-12-02 ENCOUNTER — Telehealth: Payer: Self-pay | Admitting: Pulmonary Disease

## 2022-12-02 ENCOUNTER — Encounter (HOSPITAL_COMMUNITY): Payer: Self-pay | Admitting: Gastroenterology

## 2022-12-02 DIAGNOSIS — K31819 Angiodysplasia of stomach and duodenum without bleeding: Secondary | ICD-10-CM | POA: Diagnosis not present

## 2022-12-02 DIAGNOSIS — J9611 Chronic respiratory failure with hypoxia: Secondary | ICD-10-CM | POA: Diagnosis not present

## 2022-12-02 DIAGNOSIS — K922 Gastrointestinal hemorrhage, unspecified: Secondary | ICD-10-CM | POA: Diagnosis not present

## 2022-12-02 DIAGNOSIS — J449 Chronic obstructive pulmonary disease, unspecified: Secondary | ICD-10-CM | POA: Diagnosis not present

## 2022-12-02 LAB — CBC
HCT: 27.7 % — ABNORMAL LOW (ref 39.0–52.0)
Hemoglobin: 8.2 g/dL — ABNORMAL LOW (ref 13.0–17.0)
MCH: 25 pg — ABNORMAL LOW (ref 26.0–34.0)
MCHC: 29.6 g/dL — ABNORMAL LOW (ref 30.0–36.0)
MCV: 84.5 fL (ref 80.0–100.0)
Platelets: 350 10*3/uL (ref 150–400)
RBC: 3.28 MIL/uL — ABNORMAL LOW (ref 4.22–5.81)
RDW: 16.1 % — ABNORMAL HIGH (ref 11.5–15.5)
WBC: 10.2 10*3/uL (ref 4.0–10.5)
nRBC: 0 % (ref 0.0–0.2)

## 2022-12-02 LAB — GLUCOSE, CAPILLARY
Glucose-Capillary: 104 mg/dL — ABNORMAL HIGH (ref 70–99)
Glucose-Capillary: 156 mg/dL — ABNORMAL HIGH (ref 70–99)

## 2022-12-02 MED ORDER — METOPROLOL SUCCINATE ER 25 MG PO TB24: 12.5000 mg | ORAL_TABLET | Freq: Every day | ORAL | 11 refills | Status: DC

## 2022-12-02 MED ORDER — FUROSEMIDE 20 MG PO TABS
20.0000 mg | ORAL_TABLET | Freq: Every day | ORAL | Status: DC
  Administered 2022-12-02: 20 mg via ORAL
  Filled 2022-12-02: qty 1

## 2022-12-02 MED ORDER — METOPROLOL SUCCINATE ER 25 MG PO TB24
12.5000 mg | ORAL_TABLET | Freq: Every day | ORAL | Status: DC
  Administered 2022-12-02: 12.5 mg via ORAL
  Filled 2022-12-02: qty 1

## 2022-12-02 MED ORDER — LOSARTAN POTASSIUM 50 MG PO TABS
50.0000 mg | ORAL_TABLET | Freq: Every day | ORAL | Status: DC
  Administered 2022-12-02: 50 mg via ORAL
  Filled 2022-12-02: qty 1

## 2022-12-02 MED ORDER — PANTOPRAZOLE SODIUM 40 MG PO TBEC: 40.0000 mg | DELAYED_RELEASE_TABLET | Freq: Two times a day (BID) | ORAL | Status: DC

## 2022-12-02 MED ORDER — APIXABAN 5 MG PO TABS: 5.0000 mg | ORAL_TABLET | Freq: Two times a day (BID) | ORAL | 0 refills | Status: DC

## 2022-12-02 NOTE — Progress Notes (Signed)
Pt picked up by [facility] for transport to [facility].

## 2022-12-02 NOTE — Telephone Encounter (Signed)
ose, Selina Cooley, [place] Newcomer; [place], [place]; [place], Larina Earthly afternoon-once we reached out to patient, we found that he is in a skilled facility. We are unable to do ono or pressure change on his unit while he is in a skilled facility.  Please have the facility do an overnight on the patient.  Thank you,

## 2022-12-02 NOTE — TOC Initial Note (Signed)
Transition of Care [facility]) - Initial/Assessment Note    Patient Details  Name: Reginald [name] MRN: [id number] Date of Birth: [birth year]  Transition of [facility] CM/SW Contact:    Mearl Latin, LCSW Phone Number: 12/02/2022, 11:58 AM  Clinical Narrative:                 CSW spoke with patient's son and made him aware that patient will likely discharge back to [facility] tomorrow. He reported understanding.   Expected Discharge Plan: Skilled Nursing Facility Barriers to Discharge: Continued Medical Work up   Patient Goals and CMS Choice Patient states their goals for this hospitalization and ongoing recovery are:: return to snf CMS Medicare.gov Compare Post Acute Care list provided to:: Patient Choice offered to / list presented to : Patient [place] ownership interest in [facility].provided to:: Patient    Expected Discharge Plan and Services In-house Referral: Clinical Social Work   Post Acute Care Choice: Skilled Nursing Facility Living arrangements for the past 2 months: Skilled Nursing Facility Expected Discharge Date: 12/02/22                                    Prior Living Arrangements/Services Living arrangements for the past 2 months: Skilled Nursing Facility Lives with:: Facility Resident Patient language and need for interpreter reviewed:: Yes Do you feel safe going back to the place where you live?: Yes      Need for Family Participation in Patient Care: No (Comment) Care giver support system in place?: Yes (comment)   Criminal Activity/Legal Involvement Pertinent to Current Situation/Hospitalization: No - Comment as needed  Activities of Daily Living Home Assistive Devices/Equipment: Wheelchair, Environmental consultant (specify type) ADL Screening (condition at time of admission) Patient's cognitive ability adequate to safely complete daily activities?: Yes Is the patient deaf or have difficulty hearing?: No Does the patient have difficulty seeing, even  when wearing glasses/contacts?: No Does the patient have difficulty concentrating, remembering, or making decisions?: No Patient able to express need for assistance with ADLs?: Yes Does the patient have difficulty dressing or bathing?: Yes Independently performs ADLs?: Yes (appropriate for developmental age) Does the patient have difficulty walking or climbing stairs?: Yes Weakness of Legs: Both Weakness of Arms/Hands: None  Permission Sought/Granted Permission sought to share information with : Facility Medical sales representative, Family Supports Permission granted to share information with : Yes, Verbal Permission Granted  Share Information with NAME: Berna Spare  Permission granted to share info w AGENCY: [facility]  Permission granted to share info w Relationship: Son  Permission granted to share info w Contact Information: [phone]  Emotional Assessment Appearance:: Appears stated age            Admission diagnosis:  GI bleed [K92.2] Gastrointestinal hemorrhage, unspecified gastrointestinal hemorrhage type [K92.2] Anemia, unspecified type [D64.9] Patient Active Problem List   Diagnosis Date Noted   Anemia 11/28/2022   Chronic obstructive pulmonary disease (HCC) 11/28/2022   Chronic hypoxic respiratory failure (HCC) 11/28/2022   Anemia, posthemorrhagic, acute 11/28/2022   Anticoagulated 11/28/2022   AVM (arteriovenous malformation) of small bowel, acquired 11/28/2022   AVM (arteriovenous malformation) of stomach, acquired 11/28/2022   Upper GI bleed 11/27/2022   Gastroesophageal reflux disease 09/14/2022   Atypical chest pain 09/13/2022   Chest pain 07/01/2021   History of CVA with residual deficit 07/01/2021   Pneumothorax 04/08/2021   Stroke (HCC) 08/05/2020   Vision loss of right eye 06/09/2020  Acute on chronic renal failure (HCC) 12/19/2018   Acute renal failure superimposed on stage 4 chronic kidney disease (HCC) 12/19/2018   AKI (acute kidney injury) (HCC) 11/26/2018    HCAP (healthcare-associated pneumonia) 11/15/2018   COVID-19 11/15/2018   Chronic venous insufficiency 10/04/2017   Venous stasis ulcers of both lower extremities (HCC) 10/04/2017   Nonspecific chest pain 10/27/2016   Morbid obesity (HCC) 05/02/2016   Lymphedema 05/02/2016   Abnormal laboratory test result 10/15/2015   Cough 10/08/2015   Hematuria 09/21/2015   Hypokalemia 08/13/2015   Hypertension associated with diabetes (HCC) 08/13/2015   Multiple open wounds of lower leg 07/22/2015   CKD (chronic kidney disease) stage 3, GFR 30-59 ml/min (HCC) 07/22/2015   Cellulitis of leg, right 06/28/2015   Acute kidney injury superimposed on CKD (HCC) 06/28/2015   Hypertensive heart disease with CHF (congestive heart failure) (HCC) 05/19/2015   CAD (coronary artery disease), native coronary artery 05/19/2015   Hyperlipidemia associated with type 2 diabetes mellitus (HCC) 05/19/2015   Vitamin D deficiency 05/19/2015   Pressure ulcer 05/16/2015   Diastolic dysfunction with chronic heart failure (HCC) 05/08/2015   Blisters of multiple sites 05/08/2015   Type 2 diabetes mellitus (HCC)    Obesity, unspecified    OSA (obstructive sleep apnea)    Permanent atrial fibrillation (HCC)    Secondary DM with CKD stage 4 and hypertension (HCC)    Glaucoma    Cellulitis 05/07/2015   PCP:  Karna Dupes, MD Pharmacy:   [facility] - [place], [place] - [address] [place] [address] Phone: [phone] Fax: [phone]  [facility] - [place], [place] - [address] [place] [address] [place] [address] Phone: [phone] Fax: [phone]     Social Determinants of Health (SDOH) Social History: SDOH Screenings   Food Insecurity: No Food Insecurity (12/01/2022)  Housing: Low Risk  (12/01/2022)  Transportation Needs: No Transportation Needs (12/01/2022)  Utilities: Not At Risk (12/01/2022)  Depression (PHQ2-9): Low Risk  (07/20/2020)  Tobacco  Use: Low Risk  (11/28/2022)   SDOH Interventions:     Readmission Risk Interventions     No data to display

## 2022-12-02 NOTE — Telephone Encounter (Signed)
Dr. Val Eagle pleaser advise?

## 2022-12-02 NOTE — Plan of Care (Signed)
Discharging to [facility].

## 2022-12-02 NOTE — Discharge Summary (Addendum)
PATIENT DETAILS Name: Reginald [name] Age: 87 y.o. Sex: male Date of Birth: [birth year] MRN: [id number]. Admitting Physician: Josephine Igo, DO WUJ:[id number], Denny Levy, MD  Admit Date: 11/27/2022 Discharge date: 12/02/2022  Recommendations for Outpatient Follow-up:  Follow up with PCP in 1-2 weeks Please obtain CMP/CBC in one week  Admitted From:  Home  Disposition: Home   Discharge Condition: fair  CODE STATUS:   Code Status: Full Code   Diet recommendation:  Diet Order             Diet - low sodium heart healthy           Diet Carb Modified           DIET DYS 3 Room service appropriate? Yes; Fluid consistency: Nectar Thick  Diet effective now                    Brief Summary: Patient is a 87 y.o.  male with history of chronic atrial fibrillation on Eliquis, COPD on 2 L of oxygen at home, CVA, CKD 4, DM-2, HTN, HFpEF, CAD-who presented with upper GI bleeding with hypotension-patient initially required pressors-subsequently admitted to the ICU, stabilized and transferred to [facility] on 5/23.   Significant events: 5/19>> admit to ICU-upper GI bleed 5/20>> EGD 5/23>> transferred to [facility]   Significant studies: 5/19>> CXR: Consistent with CHF. 5/19>> CT head: No acute process   Significant microbiology data: 5/20>> respiratory virus panel: Enterovirus 5/20>> COVID PCR: Negative   Procedures: 5/20>> EGD: Red blood gastric fundus-AVM in stomach/duodenum   Consults: GI PCCM  Brief Hospital Course: Upper GI bleeding with acute blood loss anemia secondary to AVMs seen on EGD No further GI bleeding-Hb stable Continue PPI Per GI-okay to resume Eliquis.     Rhinovirus pneumonia Managed with just supportive care-stable-on just 2 L of oxygen.   Chronic hypoxic respiratory failure on 2 L of oxygen at baseline COPD OSA Continue bronchodilators Continue CPAP nightly   CKD 4 Close to baseline   History of  CVA History of right TCAR for symptomatic  carotid stenosis 06/12/2020 Eliquis initially held-will resume on discharge Continue statin   History of CAD No anginal symptoms Anticoagulation/antiplatelets initially held-see above Beta-blocker initially held as BP soft-but increasing-will resume beta-blocker on discharge. Continue statin   Permanent atrial fibrillation Rate controlled Eliquis on hold-see above   Chronic HFpEF Euvolemic Resume diuretics   HTN BP meds initially held as BP soft-BP significantly elevated today-will resume all usual meds on discharge.     DM-2 (A1c 6.0 on 04/09/2021) CBG stable with SSI while inpatient. Resume usual regimen on discharge.   History of glaucoma Continue eyedrops   Debility/deconditioning PT/OT eval-SNF recommended   Pressure Ulcer: Pressure Injury 11/29/22 Coccyx Mid Stage 2 -  Partial thickness loss of dermis presenting as a shallow open injury with a red, pink wound bed without slough. (Active)  11/29/22 1200  Location: Coccyx  Location Orientation: Mid  Staging: Stage 2 -  Partial thickness loss of dermis presenting as a shallow open injury with a red, pink wound bed without slough.  Wound Description (Comments):   Present on Admission:   Dressing Type Foam - Lift dressing to assess site every shift 12/02/22 0015    Obesity: Estimated body mass index is 38.07 kg/m as calculated from the following:   Height as of 09/30/22: 5\' 11"  (1.803 m).   Weight as of this encounter: 123.8 kg.    Discharge Diagnoses:  Principal Problem:  Upper GI bleed Active Problems:   Anemia   Chronic obstructive pulmonary disease (HCC)   Chronic hypoxic respiratory failure (HCC)   Anemia, posthemorrhagic, acute   Anticoagulated   AVM (arteriovenous malformation) of small bowel, acquired   AVM (arteriovenous malformation) of stomach, acquired   Discharge Instructions:  Activity:  As tolerated with Full fall precautions use walker/cane & assistance as needed   Discharge  Instructions     Diet - low sodium heart healthy   Complete by: As directed    Diet Carb Modified   Complete by: As directed    Discharge instructions   Complete by: As directed    Follow with Primary MD  Reginald Dupes, MD in 1-2 weeks  Please get a complete blood count and chemistry panel checked by your Primary MD at your next visit, and again as instructed by your Primary MD.  Get Medicines reviewed and adjusted: Please take all your medications with you for your next visit with your Primary MD  Laboratory/radiological data: Please request your Primary MD to go over all hospital tests and procedure/radiological results at the follow up, please ask your Primary MD to get all Hospital records sent to his/her office.  In some cases, they will be blood work, cultures and biopsy results pending at the time of your discharge. Please request that your primary care M.D. follows up on these results.  Also Note the following: If you experience worsening of your admission symptoms, develop shortness of breath, life threatening emergency, suicidal or homicidal thoughts you must seek medical attention immediately by calling 911 or calling your MD immediately  if symptoms less severe.  You must read complete instructions/literature along with all the possible adverse reactions/side effects for all the Medicines you take and that have been prescribed to you. Take any new Medicines after you have completely understood and accpet all the possible adverse reactions/side effects.   Do not drive when taking Pain medications or sleeping medications (Benzodaizepines)  Do not take more than prescribed Pain, Sleep and Anxiety Medications. It is not advisable to combine anxiety,sleep and pain medications without talking with your primary care practitioner  Special Instructions: If you have smoked or chewed Tobacco  in the last 2 yrs please stop smoking, stop any regular Alcohol  and or any Recreational  drug use.  Wear Seat belts while driving.  Please note: You were cared for by a hospitalist during your hospital stay. Once you are discharged, your primary care physician will handle any further medical issues. Please note that NO REFILLS for any discharge medications will be authorized once you are discharged, as it is imperative that you return to your primary care physician (or establish a relationship with a primary care physician if you do not have one) for your post hospital discharge needs so that they can reassess your need for medications and monitor your lab values.   Increase activity slowly   Complete by: As directed    No wound care   Complete by: As directed       Allergies as of 12/02/2022       Reactions   Adhesive [tape] Rash        Medication List     STOP taking these medications    aspirin EC 81 MG tablet       TAKE these medications    acetaminophen 500 MG tablet Commonly known as: TYLENOL Take 500 mg by mouth every 8 (eight) hours as needed for moderate  pain (for pain).   albuterol 108 (90 Base) MCG/ACT inhaler Commonly known as: VENTOLIN HFA Inhale 2 puffs into the lungs 2 (two) times daily as needed for wheezing or shortness of breath.   alum & mag hydroxide-simeth 400-400-40 MG/5ML suspension Commonly known as: MAALOX PLUS Take 10 mLs by mouth every 6 (six) hours as needed for indigestion.   apixaban 5 MG Tabs tablet Commonly known as: ELIQUIS Take 1 tablet (5 mg total) by mouth 2 (two) times daily. Start taking on: Dec 03, 2022   ARTIFICIAL TEARS PF OP Place 1 drop into both eyes 4 (four) times daily.   atorvastatin 40 MG tablet Commonly known as: LIPITOR Take 40 mg by mouth at bedtime.   atropine 1 % ophthalmic solution Place 1 drop into the right eye daily.   Biofreeze 4 % Gel Generic drug: Menthol (Topical Analgesic) Apply 1 application. topically in the morning and at bedtime. Apply to neck/shoulder   cyanocobalamin 1000  MCG tablet Commonly known as: VITAMIN B12 Take 500 mcg by mouth daily.   dorzolamide-timolol 2-0.5 % ophthalmic solution Commonly known as: COSOPT Place 1 drop into both eyes 2 (two) times daily. For glaucoma, wait 3 mins between meds   furosemide 20 MG tablet Commonly known as: LASIX Take 20 mg by mouth daily.   gabapentin 100 MG capsule Commonly known as: NEURONTIN Take 2 capsules (200 mg total) by mouth at bedtime as needed. What changed: when to take this   ipratropium 0.02 % nebulizer solution Commonly known as: ATROVENT Take 0.5 mg by nebulization every 6 (six) hours as needed for wheezing or shortness of breath.   latanoprost 0.005 % ophthalmic solution Commonly known as: XALATAN Place 1 drop into the left eye at bedtime.   losartan 50 MG tablet Commonly known as: COZAAR Take 50 mg by mouth in the morning and at bedtime.   magnesium oxide 400 MG tablet Commonly known as: MAG-OX Take 400 mg by mouth daily.   melatonin 5 MG Tabs Take 5 mg by mouth at bedtime.   metoprolol succinate 25 MG 24 hr tablet Commonly known as: Toprol XL Take 0.5 tablets (12.5 mg total) by mouth daily. What changed: how much to take   nitroGLYCERIN 0.4 MG SL tablet Commonly known as: NITROSTAT Place 0.4 mg under the tongue every 5 (five) minutes x 3 doses as needed for chest pain (and CALL PROVIDER IF NO RELIEF AFTER THE 3RD DOSE).   OXYGEN Inhale 2 L into the lungs at bedtime.   pantoprazole 40 MG tablet Commonly known as: PROTONIX Take 1 tablet (40 mg total) by mouth 2 (two) times daily. What changed: when to take this   polyethylene glycol 17 g packet Commonly known as: MIRALAX / GLYCOLAX Take 17 g by mouth daily.   prednisoLONE acetate 1 % ophthalmic suspension Commonly known as: PRED FORTE Place 1 drop into the right eye 2 (two) times daily.   sennosides-docusate sodium 8.6-50 MG tablet Commonly known as: SENOKOT-S Take 2 tablets by mouth 2 (two) times daily.    sertraline 25 MG tablet Commonly known as: ZOLOFT Take 25 mg by mouth daily.   Spiriva Respimat 2.5 MCG/ACT Aers Generic drug: Tiotropium Bromide Monohydrate Inhale 1 each into the lungs daily. For COPD   Systane 0.4-0.3 % Gel ophthalmic gel Generic drug: Polyethyl Glycol-Propyl Glycol Place 1 Application into both eyes in the morning, at noon, and at bedtime.   tamsulosin 0.4 MG Caps capsule Commonly known as: FLOMAX Take 1 capsule (  0.4 mg total) by mouth daily after supper.   Trulicity 1.5 MG/0.5ML Sopn Generic drug: Dulaglutide Inject 3 mg into the skin once a week. monday   Vascepa 1 g capsule Generic drug: icosapent Ethyl Take 1 g by mouth every evening.   Vitamin D-3 25 MCG (1000 UT) Caps Take 2,000 Units by mouth daily.        Allergies  Allergen Reactions   Adhesive [Tape] Rash     Other Procedures/Studies: DG Swallowing Func-Speech Pathology  Result Date: 12/01/2022 Table formatting from the original result was not included. Modified Barium Swallow Study Patient Details Name: KARNVIR CEJAS MRN: [id number] Date of Birth: 12-28-1935 Today's Date: 12/01/2022 HPI/PMH: HPI: Patient is an 87 y.o. male with PMH: MI, COPD, CHF, hematuria, obesity, OSA, PAD, a-fib, PNA, CVA with residual left sided deficits, CKD IV. He [facility] the hospital from [facility] 11/[facility]/22 with GI bleed. He was recently admitted March 2024 with atypical chest pain thought to be related to GERD. On 5/19, patient with AMS and recent lab work showing Hgb of 5.2. He was normotensive on admission, CT head negative for acute intracranial abnormality, fecal occult positive. He was seen by GI who performed EGD with fresh blood seen in stomach and duodenum. SLP swallow evaluation ordered secondary to patient noted to be coughing when drinking liquids. Clinical Impression: Patient presents with a mild oropharyngeal dysphagia as per this MBS. During oral phase, patient exhibited prolonged mastication and  delayed anterior to posterior transit of mechanical soft solids bolus but full clearance from oral cavity. Pharyngeal phase impairment appeared secondary to only partial hyoid excursion and partial laryngeal elevation. Epiglottic inversion was complete, however. In addition, patient with swallow initiation delay consistently to pyriform sinus (likely due to decreased sensation) with thin, nectar and honey thick liquids and head of bolus heading into pyriform sinus with puree solids and mechanical soft solids before swallow was initiated. Only trace amount of residuals observed in vallecular sinus post initial swallows with all tested consistencies. Patient had difficulty coordinating swallow when attempting 13 mm barium tablet with thin liquids and this led to trace sensed aspiration of thin liquid. Another instance of sensed aspiration occured when patient drinking large sip of thin liquids, with trace amount of thin liquid spilling over from pyriform sinus into open airway. Trace, flash penetration (PAS 2) occurred with nectar thick liquids but even with large sips, penetration was shallow/well above vocal cords, fully exited laryngeal vestibule and did not result in any aspiration. SLP is recommending to continue with Dys 3 (mechanical soft) solids and nectar thick liquids for meals but patient may have thin liquids in between meals (no other PO's when drinking thins) via cup sips (no straws). Factors that may increase risk of adverse event in presence of aspiration [name] & Clearance [name] 2021): Factors that may increase risk of adverse event in presence of aspiration [name] & Clearance [name] 2021): Limited mobility; Dependence for feeding and/or oral hygiene Recommendations/Plan: Swallowing Evaluation Recommendations Swallowing Evaluation Recommendations Recommendations: PO diet PO Diet Recommendation: Dysphagia 3 (Mechanical soft); Mildly thick liquids (Level 2, nectar thick) Liquid Administration via: Cup; Straw  Medication Administration: Whole meds with puree Supervision: Patient able to self-feed; Full supervision/cueing for swallowing strategies; Staff to assist with self-feeding Swallowing strategies  : Slow rate; Small bites/sips Postural changes: Position pt fully upright for meals Oral care recommendations: Oral care BID (2x/day); Staff/trained caregiver to provide oral care Treatment Plan Treatment Plan Treatment recommendations: Therapy as outlined in treatment plan below Follow-up  recommendations: Skilled nursing-short term rehab (<3 hours/day) Functional status assessment: Patient has had a recent decline in their functional status and demonstrates the ability to make significant improvements in function in a reasonable and predictable amount of time. Treatment frequency: Min 2x/week Treatment duration: 2 weeks Interventions: Aspiration precaution training; Diet toleration management by SLP; Patient/family education; Trials of upgraded texture/liquids; Compensatory techniques Recommendations Recommendations for follow up therapy are one component of a multi-disciplinary discharge planning process, led by the attending physician.  Recommendations may be updated based on patient status, additional functional criteria and insurance authorization. Assessment: Orofacial Exam: Orofacial Exam Oral Cavity: Oral Hygiene: WFL Oral Cavity - Dentition: Adequate natural dentition Orofacial Anatomy: WFL Oral Motor/Sensory Function: Suspected cranial nerve impairment CN VII - Facial: Left motor impairment Anatomy: Anatomy: Prominent cricopharyngeus Boluses Administered: Boluses Administered Boluses Administered: Thin liquids (Level 0); Mildly thick liquids (Level 2, nectar thick); Puree; Solid; Moderately thick liquids (Level 3, honey thick)  Oral Impairment Domain: Oral Impairment Domain Lip Closure: No labial escape Tongue control during bolus hold: Cohesive bolus between tongue to palatal seal Bolus preparation/mastication:  Slow prolonged chewing/mashing with complete recollection Bolus transport/lingual motion: Slow tongue motion Oral residue: Complete oral clearance Location of oral residue : N/A Initiation of pharyngeal swallow : Pyriform sinuses  Pharyngeal Impairment Domain: Pharyngeal Impairment Domain Soft palate elevation: No bolus between soft palate (SP)/pharyngeal wall (PW) Laryngeal elevation: Partial superior movement of thyroid cartilage/partial approximation of arytenoids to epiglottic petiole Anterior hyoid excursion: Partial anterior movement Epiglottic movement: Complete inversion Laryngeal vestibule closure: Complete, no air/contrast in laryngeal vestibule Pharyngeal stripping wave : Present - complete Pharyngeal contraction (A/P view only): N/A Pharyngoesophageal segment opening: Complete distension and complete duration, no obstruction of flow Tongue base retraction: No contrast between tongue base and posterior pharyngeal wall (PPW) Pharyngeal residue: Trace residue within or on pharyngeal structures Location of pharyngeal residue: Tongue base; Valleculae  Esophageal Impairment Domain: Esophageal Impairment Domain Esophageal clearance upright position: Complete clearance, esophageal coating Pill: Esophageal Impairment Domain Esophageal clearance upright position: Complete clearance, esophageal coating Penetration/Aspiration Scale Score: Penetration/Aspiration Scale Score 1.  Material does not enter airway: Moderately thick liquids (Level 3, honey thick); Puree; Solid; Pill 2.  Material enters airway, remains ABOVE vocal cords then ejected out: Mildly thick liquids (Level 2, nectar thick) 7.  Material enters airway, passes BELOW cords and not ejected out despite cough attempt by patient: Thin liquids (Level 0) Compensatory Strategies: Compensatory Strategies Compensatory strategies: Yes Straw: Ineffective Ineffective Straw: Thin liquid (Level 0); Mildly thick liquid (Level 2, nectar thick)   General Information: No  data recorded Diet Prior to this Study: Dysphagia 3 (mechanical soft); Mildly thick liquids (Level 2, nectar thick)   Temperature : Normal   Respiratory Status: WFL   Supplemental O2: Nasal cannula   History of Recent Intubation: No  Behavior/Cognition: Alert; Cooperative; Pleasant mood Self-Feeding Abilities: Needs assist with self-feeding; Needs set-up for self-feeding Baseline vocal quality/speech: Normal Volitional Cough: Able to elicit Volitional Swallow: Able to elicit Exam Limitations: No limitations Goal Planning: Prognosis for improved oropharyngeal function: Good No data recorded No data recorded Patient/Family Stated Goal: patient would like some water Consulted and agree with results and recommendations: Patient Pain: Pain Assessment Pain Assessment: No/denies pain End of Session: Start Time:SLP Start Time (ACUTE ONLY): 0920 Stop Time: SLP Stop Time (ACUTE ONLY): 0935 Time Calculation:SLP Time Calculation (min) (ACUTE ONLY): 15 min Charges: SLP Evaluations $ SLP Speech Visit: 1 Visit SLP Evaluations $BSS Swallow: 1 Procedure $Swallowing Treatment: 1 Procedure  SLP visit diagnosis: SLP Visit Diagnosis: Dysphagia, oropharyngeal phase (R13.12) Past Medical History: Past Medical History: Diagnosis Date  Anteroseptal myocardial infarction [facility])   Carotid artery occlusion   CHF (congestive heart failure) (HCC)   Chronic kidney disease   COPD (chronic obstructive pulmonary disease) (HCC)   Coronary atherosclerosis of unspecified type of vessel, native or graft   a. s/p prior PCI to LAD in 1997  Diverticulosis   ED (erectile dysfunction)   Foley catheter in place   Glaucoma   Hematuria   History of 2019 novel coronavirus disease (COVID-19) 11/2018  Hydronephrosis, bilateral   Hydroureter   Hypercholesteremia   Hypertensive renal disease   Hypogonadism male   Hypokalemia   Obesity hypoventilation syndrome (HCC)   Obesity, unspecified   OSA (obstructive sleep apnea)   Peripheral vascular disease (HCC)   Permanent  atrial fibrillation (HCC)   Phimosis   Pneumonia   Rhabdomyolysis   Stroke (HCC)   Tremor   Type II or unspecified type diabetes mellitus without mention of complication, not stated as uncontrolled   Unspecified essential hypertension   Urinary incontinence   Urinary retention   Vestibulopathy  Past Surgical History: Past Surgical History: Procedure Laterality Date  APPENDECTOMY  1985  CARDIAC CATHETERIZATION    CHOLECYSTECTOMY  06/2000  CIRCUMCISION N/A 05/28/2020  Procedure: CIRCUMCISION ADULT;  Surgeon: Marcine Matar, MD;  Location: [facility]S;  Service: Urology;  Laterality: N/A;  CORONARY ANGIOPLASTY  08/14/1995  stent placement to LAD   DORSAL SLIT N/A 05/28/2020  Procedure: DORSAL SLIT;  Surgeon: Marcine Matar, MD;  Location: [facility];  Service: Urology;  Laterality: N/A;  45 MINS  ENDOVENOUS ABLATION SAPHENOUS VEIN W/ LASER Left 02/21/2018  endovenous laser ablation L GSV by Fabienne Bruns MD   INGUINAL HERNIA REPAIR Right 1985  KNEE ARTHROSCOPY Left 1991  LUMBAR FUSION    NOSE SURGERY  1970s  Per Dr. Sharyn Lull [facility] in pt chart  TRANSCAROTID ARTERY REVASCULARIZATION  Right 06/12/2020  Procedure: RIGHT TRANSCAROTID ARTERY REVASCULARIZATION;  Surgeon: Nada Libman, MD;  Location: [facility] OR;  Service: Vascular;  Laterality: Right; Angela Nevin, MA, CCC-SLP Speech Therapy   DG Chest Portable 1 View  Result Date: 11/27/2022 CLINICAL DATA:  Altered level of consciousness, anemia EXAM: PORTABLE CHEST 1 VIEW COMPARISON:  09/13/2022 FINDINGS: Single frontal view of the chest demonstrates a stable cardiac silhouette. Decreased lung volumes, with chronic central vascular congestion. Bilateral interstitial prominence and left basilar airspace disease suspicious for edema. No effusion or pneumothorax. IMPRESSION: 1. Findings most consistent with congestive heart failure. Superimposed left basilar pneumonia would be difficult to exclude. Electronically Signed   By: Sharlet Salina M.D.   On: 11/27/2022 21:42   CT  Head Wo Contrast  Result Date: 11/27/2022 CLINICAL DATA:  Presents from [place] place with altered mental status. Iron-deficiency anemia. EXAM: CT HEAD WITHOUT CONTRAST TECHNIQUE: Contiguous axial images were obtained from the base of the skull through the vertex without intravenous contrast. RADIATION DOSE REDUCTION: This exam was performed according to the departmental dose-optimization program which includes automated exposure control, adjustment of the mA and/or kV according to patient size and/or use of iterative reconstruction technique. COMPARISON:  Head CT 08/09/2020 FINDINGS: Brain: There is moderately advanced global atrophy with atrophic ventriculomegaly and moderate to severe small vessel disease of the cerebral white matter. A 1 cm right parafalcine calcified meningioma is again noted along the posterosuperior falx. No mass effect. No new asymmetry is seen consistent with an acute cortical based infarct, hemorrhage,  mass effect or midline shift. There are tiny chronic bilateral gangliocapsular and left thalamic lacunar infarcts. Vascular: The carotid siphons are heavily calcified. No hyperdense central vessel. Skull: Negative for fractures or focal lesions. Sinuses/Orbits: Negative orbits. Lobular membrane thickening inferiorly in both maxillary sinuses, focally laterally in the right sphenoid air cell. Other sinuses, right mastoid air cells and both middle ears are clear. Patchy fluid interval developed in the lower left mastoid air cells. Other: None. IMPRESSION: 1. No acute intracranial CT findings or interval changes. 2. Stable atrophy and small-vessel disease. 3. 1 cm right parafalcine calcified meningioma. 4. Sinus disease. 5. Interval development of patchy fluid in the lower left mastoid air cells. Electronically Signed   By: Almira Bar M.D.   On: 11/27/2022 21:23     TODAY-DAY OF DISCHARGE:  Subjective:   Florence Music today has no headache,no chest abdominal pain,no new weakness  tingling or numbness, feels much better wants to go home today.   Objective:   Blood pressure (!) 162/89, pulse 72, temperature 97.8 F (36.6 C), temperature source Oral, resp. rate 17, weight 123.8 kg, SpO2 95 %. No intake or output data in the 24 hours ending 12/02/22 0935 Filed Weights   11/29/22 0408 12/01/22 0400 12/02/22 0500  Weight: 103.9 kg 124.6 kg 123.8 kg    Exam: Awake Alert, Oriented *3, No new F.N deficits, Normal affect Vazquez.AT,PERRAL Supple Neck,No JVD, No cervical lymphadenopathy appriciated.  Symmetrical Chest wall movement, Good air movement bilaterally, CTAB RRR,No Gallops,Rubs or new Murmurs, No Parasternal Heave +ve B.Sounds, Abd Soft, Non tender, No organomegaly appriciated, No rebound -guarding or rigidity. No Cyanosis, Clubbing or edema, No new Rash or bruise   PERTINENT RADIOLOGIC STUDIES: DG Swallowing Func-Speech Pathology  Result Date: 12/01/2022 Table formatting from the original result was not included. Modified Barium Swallow Study Patient Details Name: Reginald [name] MRN: [id number] Date of Birth: Jun 26, 1936 Today's Date: 12/01/2022 HPI/PMH: HPI: Patient is an 87 y.o. male with PMH: MI, COPD, CHF, hematuria, obesity, OSA, PAD, a-fib, PNA, CVA with residual left sided deficits, CKD IV. He presented to the hospital from SNF on 11/27/22 with GI bleed. He was recently admitted March 2024 with atypical chest pain thought to be related to GERD. On 5/19, patient with AMS and recent lab work showing Hgb of 5.2. He was normotensive on admission, CT head negative for acute intracranial abnormality, fecal occult positive. He was seen by GI who performed EGD with fresh blood seen in stomach and duodenum. SLP swallow evaluation ordered secondary to patient noted to be coughing when drinking liquids. Clinical Impression: Patient presents with a mild oropharyngeal dysphagia as per this MBS. During oral phase, patient exhibited prolonged mastication and delayed anterior to  posterior transit of mechanical soft solids bolus but full clearance from oral cavity. Pharyngeal phase impairment appeared secondary to only partial hyoid excursion and partial laryngeal elevation. Epiglottic inversion was complete, however. In addition, patient with swallow initiation delay consistently to pyriform sinus (likely due to decreased sensation) with thin, nectar and honey thick liquids and head of bolus heading into pyriform sinus with puree solids and mechanical soft solids before swallow was initiated. Only trace amount of residuals observed in vallecular sinus post initial swallows with all tested consistencies. Patient had difficulty coordinating swallow when attempting 13 mm barium tablet with thin liquids and this led to trace sensed aspiration of thin liquid. Another instance of sensed aspiration occured when patient drinking large sip of thin liquids, with trace amount of thin liquid  spilling over from pyriform sinus into open airway. Trace, flash penetration (PAS 2) occurred with nectar thick liquids but even with large sips, penetration was shallow/well above vocal cords, fully exited laryngeal vestibule and did not result in any aspiration. SLP is recommending to continue with Dys 3 (mechanical soft) solids and nectar thick liquids for meals but patient may have thin liquids in between meals (no other PO's when drinking thins) via cup sips (no straws). Factors that may increase risk of adverse event in presence of aspiration [name] & Clearance [name] 2021): Factors that may increase risk of adverse event in presence of aspiration [name] & Clearance [name] 2021): Limited mobility; Dependence for feeding and/or oral hygiene Recommendations/Plan: Swallowing Evaluation Recommendations Swallowing Evaluation Recommendations Recommendations: PO diet PO Diet Recommendation: Dysphagia 3 (Mechanical soft); Mildly thick liquids (Level 2, nectar thick) Liquid Administration via: Cup; Straw Medication Administration:  Whole meds with puree Supervision: Patient able to self-feed; Full supervision/cueing for swallowing strategies; Staff to assist with self-feeding Swallowing strategies  : Slow rate; Small bites/sips Postural changes: Position pt fully upright for meals Oral care recommendations: Oral care BID (2x/day); Staff/trained caregiver to provide oral care Treatment Plan Treatment Plan Treatment recommendations: Therapy as outlined in treatment plan below Follow-up recommendations: Skilled nursing-short term rehab (<3 hours/day) Functional status assessment: Patient has had a recent decline in their functional status and demonstrates the ability to make significant improvements in function in a reasonable and predictable amount of time. Treatment frequency: Min 2x/week Treatment duration: 2 weeks Interventions: Aspiration precaution training; Diet toleration management by SLP; Patient/family education; Trials of upgraded texture/liquids; Compensatory techniques Recommendations Recommendations for follow up therapy are one component of a multi-disciplinary discharge planning process, led by the attending physician.  Recommendations may be updated based on patient status, additional functional criteria and insurance authorization. Assessment: Orofacial Exam: Orofacial Exam Oral Cavity: Oral Hygiene: WFL Oral Cavity - Dentition: Adequate natural dentition Orofacial Anatomy: WFL Oral Motor/Sensory Function: Suspected cranial nerve impairment CN VII - Facial: Left motor impairment Anatomy: Anatomy: Prominent cricopharyngeus Boluses Administered: Boluses Administered Boluses Administered: Thin liquids (Level 0); Mildly thick liquids (Level 2, nectar thick); Puree; Solid; Moderately thick liquids (Level 3, honey thick)  Oral Impairment Domain: Oral Impairment Domain Lip Closure: No labial escape Tongue control during bolus hold: Cohesive bolus between tongue to palatal seal Bolus preparation/mastication: Slow prolonged  chewing/mashing with complete recollection Bolus transport/lingual motion: Slow tongue motion Oral residue: Complete oral clearance Location of oral residue : N/A Initiation of pharyngeal swallow : Pyriform sinuses  Pharyngeal Impairment Domain: Pharyngeal Impairment Domain Soft palate elevation: No bolus between soft palate (SP)/pharyngeal wall (PW) Laryngeal elevation: Partial superior movement of thyroid cartilage/partial approximation of arytenoids to epiglottic petiole Anterior hyoid excursion: Partial anterior movement Epiglottic movement: Complete inversion Laryngeal vestibule closure: Complete, no air/contrast in laryngeal vestibule Pharyngeal stripping wave : Present - complete Pharyngeal contraction (A/P view only): N/A Pharyngoesophageal segment opening: Complete distension and complete duration, no obstruction of flow Tongue base retraction: No contrast between tongue base and posterior pharyngeal wall (PPW) Pharyngeal residue: Trace residue within or on pharyngeal structures Location of pharyngeal residue: Tongue base; Valleculae  Esophageal Impairment Domain: Esophageal Impairment Domain Esophageal clearance upright position: Complete clearance, esophageal coating Pill: Esophageal Impairment Domain Esophageal clearance upright position: Complete clearance, esophageal coating Penetration/Aspiration Scale Score: Penetration/Aspiration Scale Score 1.  Material does not enter airway: Moderately thick liquids (Level 3, honey thick); Puree; Solid; Pill 2.  Material enters airway, remains ABOVE vocal cords then ejected out: Mildly thick liquids (  Level 2, nectar thick) 7.  Material enters airway, passes BELOW cords and not ejected out despite cough attempt by patient: Thin liquids (Level 0) Compensatory Strategies: Compensatory Strategies Compensatory strategies: Yes Straw: Ineffective Ineffective Straw: Thin liquid (Level 0); Mildly thick liquid (Level 2, nectar thick)   General Information: No data recorded  Diet Prior to this Study: Dysphagia 3 (mechanical soft); Mildly thick liquids (Level 2, nectar thick)   Temperature : Normal   Respiratory Status: WFL   Supplemental O2: Nasal cannula   History of Recent Intubation: No  Behavior/Cognition: Alert; Cooperative; Pleasant mood Self-Feeding Abilities: Needs assist with self-feeding; Needs set-up for self-feeding Baseline vocal quality/speech: Normal Volitional Cough: Able to elicit Volitional Swallow: Able to elicit Exam Limitations: No limitations Goal Planning: Prognosis for improved oropharyngeal function: Good No data recorded No data recorded Patient/Family Stated Goal: patient would like some water Consulted and agree with results and recommendations: Patient Pain: Pain Assessment Pain Assessment: No/denies pain End of Session: Start Time:SLP Start Time (ACUTE ONLY): 0920 Stop Time: SLP Stop Time (ACUTE ONLY): 0935 Time Calculation:SLP Time Calculation (min) (ACUTE ONLY): 15 min Charges: SLP Evaluations $ SLP Speech Visit: 1 Visit SLP Evaluations $BSS Swallow: 1 Procedure $Swallowing Treatment: 1 Procedure SLP visit diagnosis: SLP Visit Diagnosis: Dysphagia, oropharyngeal phase (R13.12) Past Medical History: Past Medical History: Diagnosis Date  Anteroseptal myocardial infarction (HCC)   Carotid artery occlusion   CHF (congestive heart failure) (HCC)   Chronic kidney disease   COPD (chronic obstructive pulmonary disease) (HCC)   Coronary atherosclerosis of unspecified type of vessel, native or graft   a. s/p prior PCI to LAD in 1997  Diverticulosis   ED (erectile dysfunction)   Foley catheter in place   Glaucoma   Hematuria   History of 2019 novel coronavirus disease (COVID-19) 11/2018  Hydronephrosis, bilateral   Hydroureter   Hypercholesteremia   Hypertensive renal disease   Hypogonadism male   Hypokalemia   Obesity hypoventilation syndrome (HCC)   Obesity, unspecified   OSA (obstructive sleep apnea)   Peripheral vascular disease (HCC)   Permanent atrial  fibrillation (HCC)   Phimosis   Pneumonia   Rhabdomyolysis   Stroke (HCC)   Tremor   Type II or unspecified type diabetes mellitus without mention of complication, not stated as uncontrolled   Unspecified essential hypertension   Urinary incontinence   Urinary retention   Vestibulopathy  Past Surgical History: Past Surgical History: Procedure Laterality Date  APPENDECTOMY  1985  CARDIAC CATHETERIZATION    CHOLECYSTECTOMY  06/2000  CIRCUMCISION N/A 05/28/2020  Procedure: CIRCUMCISION ADULT;  Surgeon: Marcine Matar, MD;  Location: [facility];  Service: Urology;  Laterality: N/A;  CORONARY ANGIOPLASTY  08/14/1995  stent placement to LAD   DORSAL SLIT N/A 05/28/2020  Procedure: DORSAL SLIT;  Surgeon: Marcine Matar, MD;  Location: [facility];  Service: Urology;  Laterality: N/A;  45 MINS  ENDOVENOUS ABLATION SAPHENOUS VEIN W/ LASER Left 02/21/2018  endovenous laser ablation L GSV by Fabienne Bruns MD   INGUINAL HERNIA REPAIR Right 1985  KNEE ARTHROSCOPY Left 1991  LUMBAR FUSION    NOSE SURGERY  1970s  Per Dr. Sharyn Lull [facility] in pt chart  TRANSCAROTID ARTERY REVASCULARIZATION  Right 06/12/2020  Procedure: RIGHT TRANSCAROTID ARTERY REVASCULARIZATION;  Surgeon: Nada Libman, MD;  Location[facility] OR;  Service: Vascular;  Laterality: Right; Angela Nevin, MA, CCC-SLP Speech Therapy     PERTINENT LAB RESULTS: CBC: Recent Labs    12/01/22 0359 12/02/22 0736  WBC 9.3 10.2  HGB 7.9* 8.2*  HCT 26.1* 27.7*  PLT 315 350   CMET CMP     Component Value Date/Time   NA 138 12/01/2022 0359   K 3.4 (L) 12/01/2022 0359   CL 106 12/01/2022 0359   CO2 27 12/01/2022 0359   GLUCOSE 101 (H) 12/01/2022 0359   BUN 20 12/01/2022 0359   CREATININE 1.34 (H) 12/01/2022 0359   CALCIUM 8.4 (L) 12/01/2022 0359   PROT 4.9 (L) 11/27/2022 2118   ALBUMIN 2.4 (L) 11/27/2022 2118   AST 16 11/27/2022 2118   ALT 17 11/27/2022 2118   ALKPHOS 59 11/27/2022 2118   BILITOT 0.5 11/27/2022 2118   GFRNONAA 52 (L) 12/01/2022 0359    GFRAA 34 (L) 12/25/2018 0627    GFR Estimated Creatinine Clearance: 55 mL/min (A) (by C-G formula based on SCr of 1.34 mg/dL (H)). No results for input(s): "LIPASE", "AMYLASE" in the last 72 hours. No results for input(s): "CKTOTAL", "CKMB", "CKMBINDEX", "TROPONINI" in the last 72 hours. Invalid input(s): "POCBNP" No results for input(s): "DDIMER" in the last 72 hours. No results for input(s): "HGBA1C" in the last 72 hours. No results for input(s): "CHOL", "HDL", "LDLCALC", "TRIG", "CHOLHDL", "LDLDIRECT" in the last 72 hours. No results for input(s): "TSH", "T4TOTAL", "T3FREE", "THYROIDAB" in the last 72 hours.  Invalid input(s): "FREET3" No results for input(s): "VITAMINB12", "FOLATE", "FERRITIN", "TIBC", "IRON", "RETICCTPCT" in the last 72 hours. Coags: No results for input(s): "INR" in the last 72 hours.  Invalid input(s): "PT" Microbiology: Recent Results (from the past 240 hour(s))  Respiratory (~20 pathogens) panel by PCR     Status: Abnormal   Collection Time: 11/28/22  1:30 AM   Specimen: Nasopharyngeal Swab; Respiratory  Result Value Ref Range Status   Adenovirus NOT DETECTED NOT DETECTED Final   Coronavirus 229E NOT DETECTED NOT DETECTED Final    Comment: (NOTE) The Coronavirus on the Respiratory Panel, DOES NOT test for the novel  Coronavirus (2019 nCoV)    Coronavirus HKU1 NOT DETECTED NOT DETECTED Final   Coronavirus NL63 NOT DETECTED NOT DETECTED Final   Coronavirus OC43 NOT DETECTED NOT DETECTED Final   Metapneumovirus NOT DETECTED NOT DETECTED Final   Rhinovirus / Enterovirus DETECTED (A) NOT DETECTED Final   Influenza A NOT DETECTED NOT DETECTED Final   Influenza B NOT DETECTED NOT DETECTED Final   Parainfluenza Virus 1 NOT DETECTED NOT DETECTED Final   Parainfluenza Virus 2 NOT DETECTED NOT DETECTED Final   Parainfluenza Virus 3 NOT DETECTED NOT DETECTED Final   Parainfluenza Virus 4 NOT DETECTED NOT DETECTED Final   Respiratory Syncytial Virus NOT DETECTED  NOT DETECTED Final   Bordetella pertussis NOT DETECTED NOT DETECTED Final   Bordetella Parapertussis NOT DETECTED NOT DETECTED Final   Chlamydophila pneumoniae NOT DETECTED NOT DETECTED Final   Mycoplasma pneumoniae NOT DETECTED NOT DETECTED Final    Comment: Performed at [facility], [address] [place], [place] [address]  MRSA Next Gen by PCR, Nasal     Status: None   Collection Time: 11/28/22  1:30 AM   Specimen: Nasal Mucosa; Nasal Swab  Result Value Ref Range Status   MRSA by PCR Next Gen NOT DETECTED NOT DETECTED Final    Comment: (NOTE) The GeneXpert MRSA Assay (FDA approved for NASAL specimens only), is one component of a comprehensive MRSA colonization surveillance program. It is not intended to diagnose MRSA infection nor to guide or monitor treatment for MRSA infections. Test performance is not FDA approved in patients less  than 46 years old. Performed at [facility], [address], [place], [place] [address]   SARS Coronavirus 2 by RT PCR (hospital order, performed in [facility] hospital lab) *cepheid single result test* Anterior Nasal Swab     Status: None   Collection Time: 11/28/22  1:30 AM   Specimen: Anterior Nasal Swab  Result Value Ref Range Status   SARS Coronavirus 2 by RT PCR NEGATIVE NEGATIVE Final    Comment: Performed at [facility], [address], [place], [place] [address]    FURTHER DISCHARGE INSTRUCTIONS:  Get Medicines reviewed and adjusted: Please take all your medications with you for your next visit with your Primary MD  Laboratory/radiological data: Please request your Primary MD to go over all hospital tests and procedure/radiological results at the follow up, please ask your Primary MD to get all Hospital records sent to his/her office.  In some cases, they will be blood work, cultures and biopsy results pending at the time of your discharge. Please request that your primary care M.D. goes through all the records of  your hospital data and follows up on these results.  Also Note the following: If you experience worsening of your admission symptoms, develop shortness of breath, life threatening emergency, suicidal or homicidal thoughts you must seek medical attention immediately by calling 911 or calling your MD immediately  if symptoms less severe.  You must read complete instructions/literature along with all the possible adverse reactions/side effects for all the Medicines you take and that have been prescribed to you. Take any new Medicines after you have completely understood and accpet all the possible adverse reactions/side effects.   Do not drive when taking Pain medications or sleeping medications (Benzodaizepines)  Do not take more than prescribed Pain, Sleep and Anxiety Medications. It is not advisable to combine anxiety,sleep and pain medications without talking with your primary care practitioner  Special Instructions: If you have smoked or chewed Tobacco  in the last 2 yrs please stop smoking, stop any regular Alcohol  and or any Recreational drug use.  Wear Seat belts while driving.  Please note: You were cared for by a hospitalist during your hospital stay. Once you are discharged, your primary care physician will handle any further medical issues. Please note that NO REFILLS for any discharge medications will be authorized once you are discharged, as it is imperative that you return to your primary care physician (or establish a relationship with a primary care physician if you do not have one) for your post hospital discharge needs so that they can reassess your need for medications and monitor your lab values.  Total Time spent coordinating discharge including counseling, education and face to face time equals greater than 30 minutes.  SignedJeoffrey Massed 12/02/2022 9:35 AM

## 2022-12-02 NOTE — Progress Notes (Signed)
Speech Language Pathology Treatment: Dysphagia  Patient Details Name: Reginald [name] MRN: [id number] DOB: 04-25-1936 Today's Date: 12/02/2022 Time: 7829-5621 SLP Time Calculation (min) (ACUTE ONLY): 10 min  Assessment / Plan / Recommendation Clinical Impression  Pt seen for PO tolerance. Pt observed with trials of nectar-thick liquids and thin liquids. Pt with immediate coughing following thin liquid trials. Pt noted to be in semi-reclined position at this time. No further overt s/sx with subsequent thin liquid or nectar-thick liquid trials when pt repositioned by SLP into upright position Reviewed results of MBSS, rationale for diet recommendations, safe swallowing strategies/aspiration precautions, and SLP POC. Reinforcement of content needed. Pt likely to benefit from set up assistance with POs for positioning and preference for semi-reclined positioning while in bed. Recommend continuation of mech soft diet with nectar-thick liquids; thin liquids between meals ONLY; safe swallowing strategies/aspiration precautions as outlined below. SLP to continue to follow.    HPI HPI: Patient is an 87 y.o. male with PMH: MI, COPD, CHF, hematuria, obesity, OSA, PAD, a-fib, PNA, CVA with residual left sided deficits, CKD IV. He presented to the hospital from SNF on 11/27/22 with GI bleed. He was recently admitted March 2024 with atypical chest pain thought to be related to GERD. On 5/19, patient with AMS and recent lab work showing Hgb of 5.2. He was normotensive on admission, CT head negative for acute intracranial abnormality, fecal occult positive. He was seen by GI who performed EGD with fresh blood seen in stomach and duodenum. SLP swallow evaluation ordered secondary to patient noted to be coughing when drinking liquids. MBSS completed 12/01/22 - mech soft with nectar-thick liquids; OK for thin liquids between meals.      SLP Plan  Continue with current plan of care      Recommendations for follow up  therapy are one component of a multi-disciplinary discharge planning process, led by the attending physician.  Recommendations may be updated based on patient status, additional functional criteria and insurance authorization.    Recommendations  Diet recommendations: Dysphagia 3 (mechanical soft);Nectar-thick liquid (may have thin liquids between meals) Liquids provided via: Cup;Straw Medication Administration: Crushed with puree Supervision: Patient able to self feed;Full supervision/cueing for compensatory strategies Compensations: Slow rate;Small sips/bites Postural Changes and/or Swallow Maneuvers: Seated upright 90 degrees                  Oral care BID;Staff/trained caregiver to provide oral care   Frequent or constant Supervision/Assistance Dysphagia, oropharyngeal phase (R13.12)     Continue with current plan of care    Clyde Canterbury, M.S., CCC-SLP Speech-Language Pathologist Secure Chat Preferred  O: [phone]  Woodroe Chen  12/02/2022, 12:05 PM

## 2022-12-02 NOTE — TOC Transition Note (Signed)
Transition of Care [facility]) - CM/SW Discharge Note   Patient Details  Name: Reginald [name] MRN: [id number] Date of Birth: 1935/10/26  [facility] CM/SW Contact:  Mearl Latin, LCSW Phone Number: 12/02/2022, 12:00 PM   Clinical Narrative:    Patient will DC to: [facility] Anticipated DC date: 12/02/22 Family notified: Sig other, Margie. Let vm for son and daughters as requested Transport by: Sharin Mons   Per MD patient ready for DC to [facility]. RN to call report prior to discharge [phone] room 402b\). RN, patient, patient's family, and facility notified of DC. Discharge Summary and FL2 sent to facility. DC packet on chart. Ambulance transport requested for patient.   CSW will sign off for now as social work intervention is no longer needed. Please consult [place] again if new needs arise.     Final next level of care: Skilled Nursing Facility Barriers to Discharge: Barriers Resolved   Patient Goals and CMS Choice CMS Medicare.gov Compare Post Acute Care list provided to:: Patient Choice offered to / list presented to : Patient  Discharge Placement     Existing PASRR number confirmed : 12/02/22          Patient [facility] bed at: [facility] Patient to be transferred to facility by: [facility] Name of family member notified: Sig other Patient and family notified of of transfer: 12/02/22  Discharge Plan and Services Additional resources added to the After Visit Summary for   In-house Referral: Clinical Social Work   Post Acute Care Choice: Skilled Nursing Facility                               Social Determinants of Health (SDOH) Interventions SDOH Screenings   Food Insecurity: No Food Insecurity (12/01/2022)  Housing: Low Risk  (12/01/2022)  Transportation Needs: No Transportation Needs (12/01/2022)  Utilities: Not At Risk (12/01/2022)  Depression (PHQ2-9): Low Risk  (07/20/2020)  Tobacco Use: Low Risk  (11/28/2022)     Readmission Risk  Interventions     No data to display

## 2022-12-08 NOTE — Telephone Encounter (Signed)
Okay to have facility order to do ONO with CPAP in place

## 2022-12-13 NOTE — Telephone Encounter (Signed)
Order has been faxed to [phone]

## 2023-01-17 ENCOUNTER — Ambulatory Visit (INDEPENDENT_AMBULATORY_CARE_PROVIDER_SITE_OTHER): Payer: Medicare Other | Admitting: Pulmonary Disease

## 2023-01-17 ENCOUNTER — Encounter: Payer: Self-pay | Admitting: Pulmonary Disease

## 2023-01-17 VITALS — BP 122/68 | HR 59 | Ht 71.0 in | Wt 252.0 lb

## 2023-01-17 DIAGNOSIS — J9611 Chronic respiratory failure with hypoxia: Secondary | ICD-10-CM | POA: Diagnosis not present

## 2023-01-17 DIAGNOSIS — G4733 Obstructive sleep apnea (adult) (pediatric): Secondary | ICD-10-CM | POA: Diagnosis not present

## 2023-01-17 NOTE — Patient Instructions (Signed)
Follow-up in about 3 months  Prescription for CPAP of 10  Oxygen at 2 L will be added to the system  Obtain overnight oximetry with CPAP and oxygen in place

## 2023-01-17 NOTE — Progress Notes (Signed)
Reginald [name]    [id number]    05-29-1936  Primary Care Physician:Blake, Denny Levy, MD  Referring Physician: Karna Dupes, MD [address] [place],  [place] [address]  Chief complaint:   Patient being seen for history of obstructive sleep apnea, chronic respiratory failure COPD  HPI:  In for follow-up today  Was found to have obstructive sleep apnea on home sleep study, plan had been to initiate CPAP at a pressure of 10 -Most optimal treatment would have been to go to the sleep lab for pressure titration but was unable to do this He has chronic respiratory failure for which she is on oxygen supplementation  He does continue to use bronchodilators  Used to use CPAP previously Used to use it regularly Does not recollect having any specific issues outside difficulty tolerating the mask  He does have some difficulty falling asleep  He gets in bed quite early in the evening but does not go to sleep until about midnight Takes him a few hours to fall asleep Will usually get up about 430 to 5 AM to get changed, then goes back to sleep His time out of bed varies a lot Not sure about his mealtimes as well  He does use oxygen supplementation at night Uses oxygen supplementation during the day as well  Never smoked  Noted on his medications is that he is on Spiriva and also on nebulization treatments  Does have a history of heart disease and history of heart failure, atrial fibrillation, coronary artery disease, hypertension, chronic kidney disease stage IV, diabetes  History of a stroke to left him with 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   albuterol (VENTOLIN HFA) 108 (90 Base) MCG/ACT inhaler Inhale 2 puffs into the lungs 2 (two) times daily as needed for  wheezing or shortness of breath.   alum & mag hydroxide-simeth (MAALOX PLUS) 400-400-40 MG/5ML suspension Take 10 mLs by mouth every 6 (six) hours as needed for indigestion.   apixaban (ELIQUIS) 5 MG TABS tablet Take 1 tablet (5 mg total) by mouth 2 (two) times daily.   atorvastatin (LIPITOR) 40 MG tablet Take 40 mg by mouth at bedtime.   atropine 1 % ophthalmic solution Place 1 drop into the right eye daily.   Cholecalciferol (VITAMIN D-3) 25 MCG (1000 UT) CAPS Take 2,000 Units by mouth daily.   Dextran 70-Hypromellose (ARTIFICIAL TEARS PF OP) Place 1 drop into both eyes 4 (four) times daily.   dorzolamide-timolol (COSOPT) 22.3-6.8 MG/ML ophthalmic solution Place 1 drop into both eyes 2 (two) times daily. For glaucoma, wait 3 mins between meds   Dulaglutide (TRULICITY) 1.5 MG/0.5ML SOPN Inject 3 mg into the skin once a week. monday   furosemide (LASIX) 20 MG tablet Take 20 mg by mouth daily.   gabapentin (NEURONTIN) 100 MG capsule Take 2 capsules (200 mg total) by mouth at bedtime as needed. (Patient taking differently: Take 200 mg by mouth at bedtime.)   ipratropium (ATROVENT) 0.02 % nebulizer solution Take 0.5 mg by nebulization every 6 (six) hours as needed for wheezing or shortness of breath.   latanoprost (XALATAN) 0.005 % ophthalmic solution Place 1 drop into the left eye at bedtime.   losartan (COZAAR) 50 MG tablet Take 50 mg  by mouth in the morning and at bedtime.   magnesium oxide (MAG-OX) 400 MG tablet Take 400 mg by mouth daily.   Melatonin 5 MG TABS Take 5 mg by mouth at bedtime.   Menthol, Topical Analgesic, (BIOFREEZE) 4 % GEL Apply 1 application. topically in the morning and at bedtime. Apply to neck/shoulder   metoprolol succinate (TOPROL XL) 25 MG 24 hr tablet Take 0.5 tablets (12.5 mg total) by mouth daily.   nitroGLYCERIN (NITROSTAT) 0.4 MG SL tablet Place 0.4 mg under the tongue every 5 (five) minutes x 3 doses as needed for chest pain (and CALL PROVIDER IF NO RELIEF AFTER THE  3RD DOSE).   OXYGEN Inhale 2 L into the lungs at bedtime.   pantoprazole (PROTONIX) 40 MG tablet Take 1 tablet (40 mg total) by mouth 2 (two) times daily.   Polyethyl Glycol-Propyl Glycol (SYSTANE) 0.4-0.3 % GEL ophthalmic gel Place 1 Application into both eyes in the morning, at noon, and at bedtime.   polyethylene glycol (MIRALAX / GLYCOLAX) 17 g packet Take 17 g by mouth daily.   prednisoLONE acetate (PRED FORTE) 1 % ophthalmic suspension Place 1 drop into the right eye 2 (two) times daily.   sennosides-docusate sodium (SENOKOT-S) 8.6-50 MG tablet Take 2 tablets by mouth 2 (two) times daily.   sertraline (ZOLOFT) 25 MG tablet Take 25 mg by mouth daily.   SPIRIVA RESPIMAT 2.5 MCG/ACT AERS Inhale 1 each into the lungs daily. For COPD   tamsulosin (FLOMAX) 0.4 MG CAPS capsule Take 1 capsule (0.4 mg total) by mouth daily after supper.   VASCEPA 1 g capsule Take 1 g by mouth every evening.   vitamin B-12 (CYANOCOBALAMIN) 1000 MCG tablet Take 500 mcg by mouth daily.   No facility-administered encounter medications on file as of 01/17/2023.    Allergies as of 01/17/2023 - Review Complete 01/17/2023  Allergen Reaction Noted   Adhesive [tape] Rash 08/11/2016    Past Medical History:  Diagnosis Date   Anteroseptal myocardial infarction [facility])    Carotid artery occlusion    CHF (congestive heart failure) (HCC)    Chronic kidney disease    COPD (chronic obstructive pulmonary disease) (HCC)    Coronary atherosclerosis of unspecified type of vessel, native or graft    a. s/p prior PCI to LAD in 1997   Diverticulosis    ED (erectile dysfunction)    Foley catheter in place    Glaucoma    Hematuria    History of 2019 novel coronavirus disease (COVID-19) 11/2018   Hydronephrosis, bilateral    Hydroureter    Hypercholesteremia    Hypertensive renal disease    Hypogonadism male    Hypokalemia    Obesity hypoventilation syndrome (HCC)    Obesity, unspecified    OSA (obstructive sleep apnea)     Peripheral vascular disease (HCC)    Permanent atrial fibrillation (HCC)    Phimosis    Pneumonia    Rhabdomyolysis    Stroke (HCC)    Tremor    Type II or unspecified type diabetes mellitus without mention of complication, not stated as uncontrolled    Unspecified essential hypertension    Urinary incontinence    Urinary retention    Vestibulopathy     Past Surgical History:  Procedure Laterality Date   APPENDECTOMY  1985   CARDIAC CATHETERIZATION     CHOLECYSTECTOMY  06/2000   CIRCUMCISION N/A 05/28/2020   Procedure: CIRCUMCISION ADULT;  Surgeon: Marcine Matar, MD;  Location: [facility] ORS;  Service: Urology;  Laterality: N/A;   CORONARY ANGIOPLASTY  08/14/1995   stent placement to LAD    DORSAL SLIT N/A 05/28/2020   Procedure: DORSAL SLIT;  Surgeon: Marcine Matar, MD;  Location: [facility] ORS;  Service: Urology;  Laterality: N/A;  45 MINS   ENDOVENOUS ABLATION SAPHENOUS VEIN W/ LASER Left 02/21/2018   endovenous laser ablation L GSV by Fabienne Bruns MD    ENTEROSCOPY N/A 11/28/2022   Procedure: ENTEROSCOPY;  Surgeon: Benancio Deeds, MD;  Location: [facility] ENDOSCOPY;  Service: Gastroenterology;  Laterality: N/A;   HOT HEMOSTASIS N/A 11/28/2022   Procedure: HOT HEMOSTASIS (ARGON PLASMA COAGULATION/BICAP);  Surgeon: Benancio Deeds, MD;  Location: [facility] ENDOSCOPY;  Service: Gastroenterology;  Laterality: N/A;   INGUINAL HERNIA REPAIR Right 1985   KNEE ARTHROSCOPY Left 1991   LUMBAR FUSION     NOSE SURGERY  1970s   Per Dr. Sharyn Lull [facility] in pt chart   TRANSCAROTID ARTERY REVASCULARIZATION  Right 06/12/2020   Procedure: RIGHT TRANSCAROTID ARTERY REVASCULARIZATION;  Surgeon: Nada Libman, MD;  Location: [facility] OR;  Service: Vascular;  Laterality: Right;    Family History  Problem Relation Age of Onset   COPD Mother    Heart disease Mother    Diabetes Father    Heart disease Father    Diabetes Sister    Heart disease Brother    Heart disease Brother    Breast cancer  Maternal Aunt    Diabetes Paternal Grandmother    Colon cancer Maternal Aunt    Ovarian cancer Daughter    Glaucoma Other     Social History   Socioeconomic History   Marital status: Single    Spouse name: Not on file   Number of children: 5   Years of education: Not on file   Highest education level: Not on file  Occupational History   Occupation: retired  Tobacco Use   Smoking status: Never    Passive exposure: Never   Smokeless tobacco: Never  Vaping Use   Vaping Use: Never used  Substance and Sexual Activity   Alcohol use: No    Alcohol/week: 0.0 standard drinks of alcohol   Drug use: No   Sexual activity: Never  Other Topics Concern   Not on file  Social History Narrative   02/17/21 lives at [facility]   Social Determinants of Health   Financial Resource Strain: Not on file  Food Insecurity: No Food Insecurity (12/01/2022)   Hunger Vital Sign    Worried About Running Out of Food in the Last Year: Never true    Ran Out of Food in the Last Year: Never true  Transportation Needs: No Transportation Needs (12/01/2022)   PRAPARE - Administrator, Civil Service (Medical): No    Lack of Transportation (Non-Medical): No  Physical Activity: Not on file  Stress: Not on file  Social Connections: Not on file  Intimate Partner Violence: Not At Risk (12/01/2022)   Humiliation, Afraid, Rape, and Kick questionnaire    Fear of Current or Ex-Partner: No    Emotionally Abused: No    Physically Abused: No    Sexually Abused: No    Review of Systems  Respiratory:  Positive for apnea and shortness of breath.   Psychiatric/Behavioral:  Positive for sleep disturbance.     Vitals:   01/17/23 1345  BP: 122/68  Pulse: (!) 59  SpO2: 96%     Physical Exam Constitutional:      Appearance: He is  obese.  HENT:     Head: Normocephalic.     Mouth/Throat:     Mouth: Mucous membranes are moist.  Eyes:     General: No scleral icterus.    Pupils: Pupils are  equal, round, and reactive to light.  Cardiovascular:     Rate and Rhythm: Normal rate and regular rhythm.     Heart sounds: No murmur heard.    No friction rub.  Pulmonary:     Effort: No respiratory distress.     Breath sounds: No stridor. No wheezing or rhonchi.     Comments: Decreased air movement bilaterally Musculoskeletal:     Cervical back: No rigidity or tenderness.  Neurological:     Mental Status: He is alert.  Psychiatric:        Mood and Affect: Mood normal.       09/30/2022    1:00 PM  Results of the Epworth flowsheet  Sitting and reading 0  Watching TV 2  Sitting, inactive in a public place (e.g. a theatre or a meeting) 2  As a passenger in a car for an hour without a break 2  Lying down to rest in the afternoon when circumstances permit 2  Sitting and talking to someone 0  Sitting quietly after a lunch without alcohol 2  In a car, while stopped for a few minutes in traffic 0  Total score 10     Data Reviewed: Records that was sent over was reviewed listed in his medical conditions and medications  Sleep study performed 10/20/2022 showing mild obstructive sleep apnea, with moderately severe oxygen desaturations  Assessment:  History of obstructive sleep apnea -Not currently being treated -Will recommend being initiated on a CPAP of 10  -Will need oxygen supplementation added into the device with an overnight oximetry ordered following set up  Daytime sleepiness -Likely related to untreated sleep disordered breathing  Insomnia  History of stroke  History of atrial fibrillation  History of obstructive lung disease  History of diastolic heart failure   Plan/Recommendations: Initiate CPAP at CPAP of 10  Addition of 2 L of oxygen into the system Obtain overnight oximetry with CPAP and oxygen supplementation in place  Continue Spiriva, continue nebulization treatments  Increase activity as able  Call with significant concerns   Virl Diamond MD [facility] 01/17/2023, 1:55 PM  CC: Karna Dupes, MD

## 2023-01-25 ENCOUNTER — Inpatient Hospital Stay (HOSPITAL_COMMUNITY)
Admission: EM | Admit: 2023-01-25 | Discharge: 2023-01-30 | DRG: 871 | Disposition: A | Discharge status: Skilled Nursing Facility | Payer: Medicare Other | Attending: Internal Medicine | Admitting: Internal Medicine

## 2023-01-25 ENCOUNTER — Emergency Department (HOSPITAL_COMMUNITY): Payer: Medicare Other

## 2023-01-25 ENCOUNTER — Other Ambulatory Visit: Payer: Self-pay

## 2023-01-25 DIAGNOSIS — I4821 Permanent atrial fibrillation: Secondary | ICD-10-CM | POA: Diagnosis present

## 2023-01-25 DIAGNOSIS — Z8249 Family history of ischemic heart disease and other diseases of the circulatory system: Secondary | ICD-10-CM

## 2023-01-25 DIAGNOSIS — I5032 Chronic diastolic (congestive) heart failure: Secondary | ICD-10-CM | POA: Diagnosis present

## 2023-01-25 DIAGNOSIS — E1151 Type 2 diabetes mellitus with diabetic peripheral angiopathy without gangrene: Secondary | ICD-10-CM | POA: Diagnosis present

## 2023-01-25 DIAGNOSIS — K649 Unspecified hemorrhoids: Secondary | ICD-10-CM | POA: Diagnosis present

## 2023-01-25 DIAGNOSIS — E876 Hypokalemia: Secondary | ICD-10-CM | POA: Diagnosis present

## 2023-01-25 DIAGNOSIS — A4151 Sepsis due to Escherichia coli [E. coli]: Principal | ICD-10-CM | POA: Diagnosis present

## 2023-01-25 DIAGNOSIS — E7800 Pure hypercholesterolemia, unspecified: Secondary | ICD-10-CM | POA: Diagnosis present

## 2023-01-25 DIAGNOSIS — I130 Hypertensive heart and chronic kidney disease with heart failure and stage 1 through stage 4 chronic kidney disease, or unspecified chronic kidney disease: Secondary | ICD-10-CM | POA: Diagnosis present

## 2023-01-25 DIAGNOSIS — E1122 Type 2 diabetes mellitus with diabetic chronic kidney disease: Secondary | ICD-10-CM | POA: Diagnosis present

## 2023-01-25 DIAGNOSIS — H409 Unspecified glaucoma: Secondary | ICD-10-CM | POA: Diagnosis present

## 2023-01-25 DIAGNOSIS — Z79899 Other long term (current) drug therapy: Secondary | ICD-10-CM

## 2023-01-25 DIAGNOSIS — E669 Obesity, unspecified: Secondary | ICD-10-CM | POA: Diagnosis present

## 2023-01-25 DIAGNOSIS — E1322 Other specified diabetes mellitus with diabetic chronic kidney disease: Secondary | ICD-10-CM | POA: Diagnosis present

## 2023-01-25 DIAGNOSIS — J449 Chronic obstructive pulmonary disease, unspecified: Secondary | ICD-10-CM | POA: Diagnosis present

## 2023-01-25 DIAGNOSIS — Z8616 Personal history of COVID-19: Secondary | ICD-10-CM

## 2023-01-25 DIAGNOSIS — F32A Depression, unspecified: Secondary | ICD-10-CM | POA: Diagnosis present

## 2023-01-25 DIAGNOSIS — R6521 Severe sepsis with septic shock: Secondary | ICD-10-CM | POA: Diagnosis present

## 2023-01-25 DIAGNOSIS — A419 Sepsis, unspecified organism: Principal | ICD-10-CM

## 2023-01-25 DIAGNOSIS — Z66 Do not resuscitate: Secondary | ICD-10-CM | POA: Diagnosis present

## 2023-01-25 DIAGNOSIS — I959 Hypotension, unspecified: Secondary | ICD-10-CM | POA: Diagnosis present

## 2023-01-25 DIAGNOSIS — Z825 Family history of asthma and other chronic lower respiratory diseases: Secondary | ICD-10-CM

## 2023-01-25 DIAGNOSIS — I2489 Other forms of acute ischemic heart disease: Secondary | ICD-10-CM | POA: Diagnosis present

## 2023-01-25 DIAGNOSIS — Z9049 Acquired absence of other specified parts of digestive tract: Secondary | ICD-10-CM

## 2023-01-25 DIAGNOSIS — L89892 Pressure ulcer of other site, stage 2: Secondary | ICD-10-CM | POA: Diagnosis present

## 2023-01-25 DIAGNOSIS — G928 Other toxic encephalopathy: Secondary | ICD-10-CM | POA: Diagnosis present

## 2023-01-25 DIAGNOSIS — I5031 Acute diastolic (congestive) heart failure: Secondary | ICD-10-CM | POA: Diagnosis not present

## 2023-01-25 DIAGNOSIS — Z7901 Long term (current) use of anticoagulants: Secondary | ICD-10-CM

## 2023-01-25 DIAGNOSIS — Z833 Family history of diabetes mellitus: Secondary | ICD-10-CM

## 2023-01-25 DIAGNOSIS — J9611 Chronic respiratory failure with hypoxia: Secondary | ICD-10-CM | POA: Diagnosis present

## 2023-01-25 DIAGNOSIS — N184 Chronic kidney disease, stage 4 (severe): Secondary | ICD-10-CM | POA: Diagnosis present

## 2023-01-25 DIAGNOSIS — Z800 Family history of malignant neoplasm of digestive organs: Secondary | ICD-10-CM

## 2023-01-25 DIAGNOSIS — Z9981 Dependence on supplemental oxygen: Secondary | ICD-10-CM

## 2023-01-25 DIAGNOSIS — I6930 Unspecified sequelae of cerebral infarction: Secondary | ICD-10-CM

## 2023-01-25 DIAGNOSIS — I69398 Other sequelae of cerebral infarction: Secondary | ICD-10-CM | POA: Diagnosis not present

## 2023-01-25 DIAGNOSIS — F419 Anxiety disorder, unspecified: Secondary | ICD-10-CM | POA: Diagnosis present

## 2023-01-25 DIAGNOSIS — E872 Acidosis, unspecified: Secondary | ICD-10-CM | POA: Diagnosis present

## 2023-01-25 DIAGNOSIS — I255 Ischemic cardiomyopathy: Secondary | ICD-10-CM | POA: Diagnosis present

## 2023-01-25 DIAGNOSIS — H5461 Unqualified visual loss, right eye, normal vision left eye: Secondary | ICD-10-CM | POA: Diagnosis present

## 2023-01-25 DIAGNOSIS — J9621 Acute and chronic respiratory failure with hypoxia: Secondary | ICD-10-CM | POA: Diagnosis present

## 2023-01-25 DIAGNOSIS — Z6837 Body mass index (BMI) 37.0-37.9, adult: Secondary | ICD-10-CM

## 2023-01-25 DIAGNOSIS — L8990 Pressure ulcer of unspecified site, unspecified stage: Secondary | ICD-10-CM | POA: Diagnosis present

## 2023-01-25 DIAGNOSIS — I252 Old myocardial infarction: Secondary | ICD-10-CM

## 2023-01-25 DIAGNOSIS — N3091 Cystitis, unspecified with hematuria: Secondary | ICD-10-CM | POA: Diagnosis present

## 2023-01-25 DIAGNOSIS — I2510 Atherosclerotic heart disease of native coronary artery without angina pectoris: Secondary | ICD-10-CM | POA: Diagnosis present

## 2023-01-25 DIAGNOSIS — Z7985 Long-term (current) use of injectable non-insulin antidiabetic drugs: Secondary | ICD-10-CM

## 2023-01-25 DIAGNOSIS — Z955 Presence of coronary angioplasty implant and graft: Secondary | ICD-10-CM

## 2023-01-25 LAB — CBC WITH DIFFERENTIAL/PLATELET
Abs Immature Granulocytes: 0.03 10*3/uL (ref 0.00–0.07)
Basophils Absolute: 0 10*3/uL (ref 0.0–0.1)
Basophils Relative: 0 %
Eosinophils Absolute: 0.1 10*3/uL (ref 0.0–0.5)
Eosinophils Relative: 2 %
HCT: 40.2 % (ref 39.0–52.0)
Hemoglobin: 11.3 g/dL — ABNORMAL LOW (ref 13.0–17.0)
Immature Granulocytes: 1 %
Lymphocytes Relative: 6 %
Lymphs Abs: 0.3 10*3/uL — ABNORMAL LOW (ref 0.7–4.0)
MCH: 24.7 pg — ABNORMAL LOW (ref 26.0–34.0)
MCHC: 28.1 g/dL — ABNORMAL LOW (ref 30.0–36.0)
MCV: 88 fL (ref 80.0–100.0)
Monocytes Absolute: 0 10*3/uL — ABNORMAL LOW (ref 0.1–1.0)
Monocytes Relative: 0 %
Neutro Abs: 4.8 10*3/uL (ref 1.7–7.7)
Neutrophils Relative %: 91 %
Platelets: 257 10*3/uL (ref 150–400)
RBC: 4.57 MIL/uL (ref 4.22–5.81)
RDW: 19.5 % — ABNORMAL HIGH (ref 11.5–15.5)
WBC: 5.3 10*3/uL (ref 4.0–10.5)
nRBC: 0 % (ref 0.0–0.2)

## 2023-01-25 LAB — COMPREHENSIVE METABOLIC PANEL
ALT: 21 U/L (ref 0–44)
AST: 25 U/L (ref 15–41)
Albumin: 2.8 g/dL — ABNORMAL LOW (ref 3.5–5.0)
Alkaline Phosphatase: 95 U/L (ref 38–126)
Anion gap: 9 (ref 5–15)
BUN: 18 mg/dL (ref 8–23)
CO2: 27 mmol/L (ref 22–32)
Calcium: 8.7 mg/dL — ABNORMAL LOW (ref 8.9–10.3)
Chloride: 106 mmol/L (ref 98–111)
Creatinine, Ser: 1.35 mg/dL — ABNORMAL HIGH (ref 0.61–1.24)
GFR, Estimated: 52 mL/min — ABNORMAL LOW (ref >60)
Glucose, Bld: 129 mg/dL — ABNORMAL HIGH (ref 70–99)
Potassium: 3.3 mmol/L — ABNORMAL LOW (ref 3.5–5.1)
Sodium: 142 mmol/L (ref 135–145)
Total Bilirubin: 0.6 mg/dL (ref 0.3–1.2)
Total Protein: 5.9 g/dL — ABNORMAL LOW (ref 6.5–8.1)

## 2023-01-25 LAB — URINALYSIS, W/ REFLEX TO CULTURE (INFECTION SUSPECTED)
Bilirubin Urine: NEGATIVE
Glucose, UA: NEGATIVE mg/dL
Ketones, ur: NEGATIVE mg/dL
Nitrite: POSITIVE — AB
Protein, ur: NEGATIVE mg/dL
Specific Gravity, Urine: 1.015 (ref 1.005–1.030)
pH: 5.5 (ref 5.0–8.0)

## 2023-01-25 LAB — I-STAT CG4 LACTIC ACID, ED
Lactic Acid, Venous: 3.7 mmol/L (ref 0.5–1.9)
Lactic Acid, Venous: 2.1 mmol/L (ref 0.5–1.9)

## 2023-01-25 LAB — I-STAT VENOUS BLOOD GAS, ED
Acid-base deficit: 2 mmol/L (ref 0.0–2.0)
Bicarbonate: 26.9 mmol/L (ref 20.0–28.0)
Calcium, Ion: 1.2 mmol/L (ref 1.15–1.40)
HCT: 39 % (ref 39.0–52.0)
Hemoglobin: 13.3 g/dL (ref 13.0–17.0)
O2 Saturation: 89 %
Potassium: 3.4 mmol/L — ABNORMAL LOW (ref 3.5–5.1)
Sodium: 144 mmol/L (ref 135–145)
TCO2: 29 mmol/L (ref 22–32)
pCO2, Ven: 65.9 mmHg — ABNORMAL HIGH (ref 44–60)
pH, Ven: 7.219 — ABNORMAL LOW (ref 7.25–7.43)
pO2, Ven: 70 mmHg — ABNORMAL HIGH (ref 32–45)

## 2023-01-25 LAB — TYPE AND SCREEN
ABO/RH(D): O POS
Antibody Screen: NEGATIVE

## 2023-01-25 LAB — PROTIME-INR
INR: 1.4 — ABNORMAL HIGH (ref 0.8–1.2)
Prothrombin Time: 17.3 seconds — ABNORMAL HIGH (ref 11.4–15.2)

## 2023-01-25 LAB — TROPONIN I (HIGH SENSITIVITY)
Troponin I (High Sensitivity): 33 ng/L — ABNORMAL HIGH (ref <18)
Troponin I (High Sensitivity): 50 ng/L — ABNORMAL HIGH (ref <18)

## 2023-01-25 LAB — LACTIC ACID, PLASMA: Lactic Acid, Venous: 2.2 mmol/L (ref 0.5–1.9)

## 2023-01-25 LAB — LIPASE, BLOOD: Lipase: 34 U/L (ref 11–51)

## 2023-01-25 MED ORDER — VANCOMYCIN HCL IN DEXTROSE 1-5 GM/200ML-% IV SOLN
1000.0000 mg | Freq: Once | INTRAVENOUS | Status: AC
  Administered 2023-01-26: 1000 mg via INTRAVENOUS
  Filled 2023-01-25: qty 200

## 2023-01-25 MED ORDER — NOREPINEPHRINE 4 MG/250ML-% IV SOLN
2.0000 ug/min | INTRAVENOUS | Status: DC
  Administered 2023-01-25 – 2023-01-26 (×2): 2 ug/min via INTRAVENOUS
  Filled 2023-01-25: qty 250

## 2023-01-25 MED ORDER — ONDANSETRON HCL 4 MG/2ML IJ SOLN: 4.0000 mg | Freq: Once | INTRAMUSCULAR | Status: AC

## 2023-01-25 MED ORDER — LACTATED RINGERS IV SOLN: INTRAVENOUS | Status: AC

## 2023-01-25 MED ORDER — ACETAMINOPHEN 650 MG RE SUPP: 650.0000 mg | Freq: Once | RECTAL | Status: AC

## 2023-01-25 MED ORDER — NOREPINEPHRINE 4 MG/250ML-% IV SOLN
0.0000 ug/min | INTRAVENOUS | Status: DC
  Filled 2023-01-25: qty 250

## 2023-01-25 MED ORDER — VANCOMYCIN HCL IN DEXTROSE 1-5 GM/200ML-% IV SOLN: 1000.0000 mg | Freq: Once | INTRAVENOUS | Status: DC

## 2023-01-25 MED ORDER — LACTATED RINGERS IV BOLUS (SEPSIS)
1000.0000 mL | Freq: Once | INTRAVENOUS | Status: AC
  Administered 2023-01-25: 1000 mL via INTRAVENOUS

## 2023-01-25 MED ORDER — LACTATED RINGERS IV BOLUS
500.0000 mL | Freq: Once | INTRAVENOUS | Status: AC
  Administered 2023-01-25: 500 mL via INTRAVENOUS

## 2023-01-25 MED ORDER — METRONIDAZOLE 500 MG/100ML IV SOLN
500.0000 mg | Freq: Once | INTRAVENOUS | Status: AC
  Administered 2023-01-25: 500 mg via INTRAVENOUS
  Filled 2023-01-25: qty 100

## 2023-01-25 MED ORDER — POLYETHYLENE GLYCOL 3350 17 G PO PACK: 17.0000 g | PACK | Freq: Every day | ORAL | Status: DC | PRN

## 2023-01-25 MED ORDER — DOCUSATE SODIUM 100 MG PO CAPS: 100.0000 mg | ORAL_CAPSULE | Freq: Two times a day (BID) | ORAL | Status: DC | PRN

## 2023-01-25 MED ORDER — IOHEXOL 350 MG/ML SOLN
75.0000 mL | Freq: Once | INTRAVENOUS | Status: AC | PRN
  Administered 2023-01-25: 75 mL via INTRAVENOUS

## 2023-01-25 MED ORDER — ONDANSETRON HCL 4 MG/2ML IJ SOLN
INTRAMUSCULAR | Status: AC
  Administered 2023-01-25: 4 mg via INTRAVENOUS
  Filled 2023-01-25: qty 2

## 2023-01-25 MED ORDER — SODIUM CHLORIDE 0.9 % IV SOLN
250.0000 mL | INTRAVENOUS | Status: DC
  Administered 2023-01-26: 250 mL via INTRAVENOUS

## 2023-01-25 MED ORDER — HEPARIN SODIUM (PORCINE) 5000 UNIT/ML IJ SOLN: 5000.0000 [IU] | Freq: Three times a day (TID) | INTRAMUSCULAR | Status: DC

## 2023-01-25 MED ORDER — ACETAMINOPHEN 650 MG RE SUPP
RECTAL | Status: AC
  Administered 2023-01-25: 650 mg via RECTAL
  Filled 2023-01-25: qty 1

## 2023-01-25 MED ORDER — VANCOMYCIN HCL 1500 MG/300ML IV SOLN
1500.0000 mg | Freq: Once | INTRAVENOUS | Status: AC
  Administered 2023-01-25: 1500 mg via INTRAVENOUS
  Filled 2023-01-25: qty 300

## 2023-01-25 MED ORDER — SODIUM CHLORIDE 0.9 % IV SOLN
2.0000 g | Freq: Once | INTRAVENOUS | Status: AC
  Administered 2023-01-25: 2 g via INTRAVENOUS
  Filled 2023-01-25: qty 12.5

## 2023-01-25 NOTE — ED Notes (Signed)
Updated staff at [facility] place regarding patient status.

## 2023-01-25 NOTE — ED Provider Notes (Signed)
[place] EMERGENCY DEPARTMENT AT [facility] Provider Note   CSN: [id number] Arrival date & time: 01/25/23  1736     History  Chief Complaint  Patient presents with   penile bleeding    Reginald [name] is a 87 y.o. male.  HPI 87 year old male history of CHF, COPD, hypertension, hypercholesterolemia, prior stroke, type 2 diabetes presenting for hematuria.  Per EMS report, patient presents from SNF.  They placed a Foley yesterday and took it out today with significant hematuria.  He is on Eliquis.  Here he reports nausea, vomiting and not feeling well for the last few days.  No fevers or chills as far as he is aware but is febrile here.  No chest pain, shortness of breath, cough, abdominal pain.  No back pain.  No headache or neurologic symptoms.     Home Medications Prior to Admission medications   Medication Sig Start Date End Date Taking? Authorizing Provider  acetaminophen (TYLENOL) 500 MG tablet Take 500 mg by mouth every 8 (eight) hours as needed (pain).   Yes [provider]  albuterol (VENTOLIN HFA) 108 (90 Base) MCG/ACT inhaler Inhale 2 puffs into the lungs 2 (two) times daily as needed for wheezing or shortness of breath. 07/03/21  Yes Marrion Coy, MD  alum & mag hydroxide-simeth (MAALOX/MYLANTA) 200-200-20 MG/5 SUSP Apply 10 mLs topically every 6 (six) hours as needed (GERD symptoms).   Yes [provider]  apixaban (ELIQUIS) 5 MG TABS tablet Take 1 tablet (5 mg total) by mouth 2 (two) times daily. 12/03/22  Yes Ghimire, Werner Lean, MD  atorvastatin (LIPITOR) 40 MG tablet Take 40 mg by mouth at bedtime.   Yes [provider]  atropine 1 % ophthalmic solution Place 1 drop into the right eye daily. 06/09/22  Yes [provider]  chlorhexidine (PERIDEX) 0.12 % solution Use as directed 15 mLs in the mouth or throat 2 (two) times daily.   Yes [provider]  Cholecalciferol (VITAMIN D-3) 25 MCG (1000 UT) CAPS Take 2,000  Units by mouth daily.   Yes [provider]  dorzolamide-timolol (COSOPT) 22.3-6.8 MG/ML ophthalmic solution Place 1 drop into both eyes 2 (two) times daily.   Yes [provider]  Dulaglutide (TRULICITY) 3 MG/0.5ML SOPN Inject 3 mg into the skin every Monday.   Yes [provider]  ferrous gluconate (FERGON) 324 MG tablet Take 324 mg by mouth at bedtime.   Yes [provider]  furosemide (LASIX) 20 MG tablet Take 20 mg by mouth daily. 11/17/22  Yes [provider]  gabapentin (NEURONTIN) 100 MG capsule Take 2 capsules (200 mg total) by mouth at bedtime as needed. Patient taking differently: Take 200 mg by mouth at bedtime. 08/07/20  Yes Leeroy Bock, MD  ipratropium (ATROVENT) 0.02 % nebulizer solution Take 0.5 mg by nebulization every 6 (six) hours as needed for wheezing or shortness of breath.   Yes [provider]  latanoprost (XALATAN) 0.005 % ophthalmic solution Place 1 drop into the left eye at bedtime. 01/03/17  Yes [provider]  losartan (COZAAR) 50 MG tablet Take 50 mg by mouth in the morning and at bedtime.   Yes [provider]  magnesium oxide (MAG-OX) 400 MG tablet Take 400 mg by mouth daily.   Yes [provider]  Menthol, Topical Analgesic, (BIOFREEZE) 4 % GEL Apply 1 application  topically in the morning and at bedtime. To neck and shoulder   Yes [provider]  metoprolol succinate (TOPROL XL) 25 MG 24 hr tablet Take 0.5 tablets (12.5 mg total) by mouth daily. 12/02/22 12/02/23 Yes Ghimire, Werner Lean, MD  nitroGLYCERIN (NITROSTAT) 0.4 MG SL tablet Place 0.4 mg under the tongue every 5 (five) minutes x 3 doses as needed for chest pain (and CALL PROVIDER IF NO RELIEF AFTER THE 3RD DOSE).   Yes [provider]  pantoprazole (PROTONIX) 40 MG tablet Take 1 tablet (40 mg total) by mouth 2 (two) times daily. 12/02/22  Yes Ghimire, Werner Lean, MD  Polyethyl Glyc-Propyl Glyc PF (SYSTANE  PRESERVATIVE FREE) 0.4-0.3 % SOLN Place 1 drop into both eyes 3 (three) times daily.   Yes [provider]  polyethylene glycol (MIRALAX / GLYCOLAX) 17 g packet Take 17 g by mouth daily.   Yes [provider]  prednisoLONE acetate (PRED FORTE) 1 % ophthalmic suspension Place 1 drop into the right eye 2 (two) times daily. 06/09/22  Yes [provider]  sennosides-docusate sodium (SENOKOT-S) 8.6-50 MG tablet Take 2 tablets by mouth 2 (two) times daily.   Yes [provider]  sertraline (ZOLOFT) 25 MG tablet Take 25 mg by mouth daily. 11/14/22  Yes [provider]  SPIRIVA RESPIMAT 2.5 MCG/ACT AERS Inhale 1 each into the lungs daily. 11/22/22  Yes [provider]  tamsulosin (FLOMAX) 0.4 MG CAPS capsule Take 1 capsule (0.4 mg total) by mouth daily after supper. 12/25/18  Yes Hall, Carole N, DO  VASCEPA 1 g capsule Take 1 g by mouth every evening.   Yes [provider]  vitamin B-12 (CYANOCOBALAMIN) 1000 MCG tablet Take 1,000 mcg by mouth daily.   Yes [provider]  Zinc Oxide (DESITIN) 40 % PSTE Apply 1 Application topically See admin instructions. Apply topically to sacrum and buttocks every shift and as needed for skin protection.   Yes [provider]  OXYGEN Inhale 2 L into the lungs at bedtime.    [provider]      Allergies    Adhesive [tape]    Review of Systems   Review of Systems Review of systems completed and notable as per HPI.  ROS otherwise negative.   Physical Exam Updated Vital Signs BP (!) 106/48 (BP Location: Right Arm)   Pulse (!) 109   Temp (!) 101.7 F (38.7 C) (Rectal)   Resp 16   SpO2 98%  Physical Exam Vitals and nursing note reviewed.  Constitutional:      General: He is in acute distress.     Appearance: He is well-developed. He is ill-appearing.     Comments: Vomiting  HENT:     Head: Normocephalic and atraumatic.     Mouth/Throat:     Mouth: Mucous membranes are  moist.     Pharynx: Oropharynx is clear.  Eyes:     Extraocular Movements: Extraocular movements intact.     Conjunctiva/sclera: Conjunctivae normal.     Pupils: Pupils are equal, round, and reactive to light.  Cardiovascular:     Rate and Rhythm: Regular rhythm. Tachycardia present.     Heart sounds: No murmur heard. Pulmonary:     Effort: Pulmonary effort is normal. No respiratory distress.     Breath sounds: Normal breath sounds.  Abdominal:     Palpations: Abdomen is soft.     Tenderness: There is no abdominal tenderness. There is no guarding or rebound.  Musculoskeletal:        General: No swelling.     Cervical back: Neck supple.  Right lower leg: Edema present.     Left lower leg: Edema present.  Skin:    General: Skin is warm and dry.     Capillary Refill: Capillary refill takes less than 2 seconds.  Neurological:     General: No focal deficit present.     Mental Status: He is alert and oriented to person, place, and time.     Cranial Nerves: No cranial nerve deficit.  Psychiatric:        Mood and Affect: Mood normal.     ED Results / Procedures / Treatments   Labs (all labs ordered are listed, but only abnormal results are displayed) Labs Reviewed  COMPREHENSIVE METABOLIC PANEL - Abnormal; Notable for the following components:      Result Value   Potassium 3.3 (*)    Glucose, Bld 129 (*)    Creatinine, Ser 1.35 (*)    Calcium 8.7 (*)    Total Protein 5.9 (*)    Albumin 2.8 (*)    GFR, Estimated 52 (*)    All other components within normal limits  CBC WITH DIFFERENTIAL/PLATELET - Abnormal; Notable for the following components:   Hemoglobin 11.3 (*)    MCH 24.7 (*)    MCHC 28.1 (*)    RDW 19.5 (*)    Lymphs Abs 0.3 (*)    Monocytes Absolute 0.0 (*)    All other components within normal limits  PROTIME-INR - Abnormal; Notable for the following components:   Prothrombin Time 17.3 (*)    INR 1.4 (*)    All other components within normal limits   URINALYSIS, W/ REFLEX TO CULTURE (INFECTION SUSPECTED) - Abnormal; Notable for the following components:   Hgb urine dipstick LARGE (*)    Nitrite POSITIVE (*)    Leukocytes,Ua MODERATE (*)    Bacteria, UA RARE (*)    All other components within normal limits  LACTIC ACID, PLASMA - Abnormal; Notable for the following components:   Lactic Acid, Venous 2.2 (*)    All other components within normal limits  I-STAT CG4 LACTIC ACID, ED - Abnormal; Notable for the following components:   Lactic Acid, Venous 3.7 (*)    All other components within normal limits  I-STAT VENOUS BLOOD GAS, ED - Abnormal; Notable for the following components:   pH, Ven 7.219 (*)    pCO2, Ven 65.9 (*)    pO2, Ven 70 (*)    Potassium 3.4 (*)    All other components within normal limits  I-STAT CG4 LACTIC ACID, ED - Abnormal; Notable for the following components:   Lactic Acid, Venous 2.1 (*)    All other components within normal limits  TROPONIN I (HIGH SENSITIVITY) - Abnormal; Notable for the following components:   Troponin I (High Sensitivity) 33 (*)    All other components within normal limits  TROPONIN I (HIGH SENSITIVITY) - Abnormal; Notable for the following components:   Troponin I (High Sensitivity) 50 (*)    All other components within normal limits  CULTURE, BLOOD (ROUTINE X 2)  CULTURE, BLOOD (ROUTINE X 2)  URINE CULTURE  LIPASE, BLOOD  LACTIC ACID, PLASMA  HEMOGLOBIN AND HEMATOCRIT, BLOOD  TYPE AND SCREEN    EKG EKG Interpretation Date/Time:  Wednesday January 25 2023 18:50:46 EDT Ventricular Rate:  152 PR Interval:    QRS Duration:  87 QT Interval:  248 QTC Calculation: 395 R Axis:   49  Text Interpretation: Atrial fibrillation with rapid V-rate Low voltage, extremity and precordial leads  Repolarization abnormality, prob rate related Artifact in lead(s) I II aVR aVL aVF Confirmed by Fulton Reek [phone]) on 01/25/2023 6:54:14 PM  Radiology CT ABDOMEN PELVIS W CONTRAST  Result Date:  01/25/2023 CLINICAL DATA:  Sepsis, hematuria, and vomiting. EXAM: CT ABDOMEN AND PELVIS WITH CONTRAST TECHNIQUE: Multidetector CT imaging of the abdomen and pelvis was performed using the standard protocol following bolus administration of intravenous contrast. RADIATION DOSE REDUCTION: This exam was performed according to the departmental dose-optimization program which includes automated exposure control, adjustment of the mA and/or kV according to patient size and/or use of iterative reconstruction technique. CONTRAST:  75mL OMNIPAQUE IOHEXOL 350 MG/ML SOLN COMPARISON:  04/08/2021. FINDINGS: Lower chest: There is a small pleural effusion on the left and trace pleural effusion on the right with atelectasis or infiltrate at the lung bases. Coronary artery calcifications are noted and there is a small pericardial effusion. Hepatobiliary: No focal liver abnormality is seen. Status post cholecystectomy. No biliary dilatation. Pancreas: Unremarkable. No pancreatic ductal dilatation or surrounding inflammatory changes. Spleen: Normal in size without focal abnormality. Adrenals/Urinary Tract: No adrenal nodule or mass. The kidneys enhance symmetrically. A nonobstructive calculus is noted in the lower pole of the right kidney. No ureteral calculus or obstructive uropathy. There is mild bladder wall thickening. Stomach/Bowel: Stomach is within normal limits. Appendix appears normal. No evidence of bowel wall thickening, distention, or inflammatory changes. No free air or pneumatosis. Vascular/Lymphatic: Aortic atherosclerosis. No enlarged abdominal or pelvic lymph nodes. Reproductive: Prostate is unremarkable. Other: No abdominopelvic ascites.  Anasarca is noted bilaterally. Musculoskeletal: Degenerative changes are present in the thoracolumbar spine. Lumbar spinal fusion hardware is noted at L3-L4. No acute osseous abnormality. IMPRESSION: 1. Small pleural effusion on the left and trace pleural effusion on the right with  atelectasis or infiltrate at the lung bases bilaterally. 2. Mild diffuse bladder wall thickening, suggesting cystitis. 3. Nonobstructive right renal calculus. 4. Coronary artery calcifications and aortic atherosclerosis. Electronically Signed   By: Thornell Sartorius M.D.   On: 01/25/2023 22:05   DG Chest Port 1 View  Result Date: 01/25/2023 CLINICAL DATA:  Chest pain EXAM: PORTABLE CHEST 1 VIEW COMPARISON:  11/27/2022 FINDINGS: Unchanged cardiac and mediastinal contours. Low lung volumes, with a central vascular congestion. Redemonstrated interstitial prominence. No pleural effusion or pneumothorax. IMPRESSION: Low lung volumes with central vascular congestion and mild interstitial edema. Electronically Signed   By: Wiliam Ke M.D.   On: 01/25/2023 19:55    Procedures Procedures    Medications Ordered in ED Medications  lactated ringers infusion ( Intravenous New Bag/Given 01/25/23 2004)  vancomycin (VANCOREADY) IVPB 1500 mg/300 mL (1,500 mg Intravenous New Bag/Given 01/25/23 2136)    Followed by  vancomycin (VANCOCIN) IVPB 1000 mg/200 mL premix (has no administration in time range)  lactated ringers bolus 500 mL (has no administration in time range)  norepinephrine (LEVOPHED) 4mg  in (0.016 mg/mL) premix infusion (has no administration in time range)  acetaminophen (TYLENOL) suppository 650 mg (650 mg Rectal Given 01/25/23 1842)  ondansetron (ZOFRAN) injection 4 mg (4 mg Intravenous Given 01/25/23 1843)  lactated ringers bolus 1,000 mL (0 mLs Intravenous Stopped 01/25/23 2131)  ceFEPIme (MAXIPIME) 2 g in sodium chloride 0.9 % 100 mL IVPB (0 g Intravenous Stopped 01/25/23 2030)  metroNIDAZOLE (FLAGYL) IVPB 500 mg (0 mg Intravenous Stopped 01/25/23 2131)  iohexol (OMNIPAQUE) 350 MG/ML injection 75 mL (75 mLs Intravenous Contrast Given 01/25/23 2154)    ED Course/ Medical Decision Making/ A&P  Medical Decision Making Amount and/or Complexity of Data Reviewed Labs:  ordered. Radiology: ordered.  Risk OTC drugs. Prescription drug management. Decision regarding hospitalization.   Medical Decision Making:   Reginald [name] is a 87 y.o. male who presented to the ED today with hematuria.  He arrives febrile, tachycardic concerning for sepsis.  On my exam he is actively vomiting.  He does have some mild hematuria on exam and is anticoagulated.  Will obtain workup for evaluation of hematuria as well as sepsis workup and treat empirically with antibiotics.  Will obtain chest x-ray, appears to be in slightly increased oxygen requirement.  He does appear slightly volume up, will fluid hydrate but be careful given he appears extravascular volume up however given vomiting I suspect he is somewhat volume depleted.   Patient placed on continuous vitals and telemetry monitoring while in ED which was reviewed periodically.  Reviewed and confirmed nursing documentation for past medical history, family history, social history.  Initial Study Results:   Laboratory  All laboratory results reviewed.  Labs notable for lactic acidosis, mild troponin elevation likely related to demand ischemia.  Renal function at baseline.  Mild hypokalemia.  Urinalysis concerning for infection.  Anemia improved from prior.  EKG EKG was reviewed independently.  A-fib RVR.  No STEMI.  Radiology:  All images reviewed independently.  Chest x-ray reviewed, no clear signs of pneumonia.  CT abdomen pelvis concerning for possible cystitis as well as pleural effusions.  Agree with radiology report at this time.     Reassessment and Plan:   On reassessment, fever and tachycardia improved.  He is no longer vomiting.  Presentation is concerning for sepsis, most likely from urinary source.  Possible bacteremia as well.  He was in A-fib RVR, however heart rate has improved with fever control and fluids.  No indication for rate control.  I have checked his undergarments multiple times, no significant  hematuria to suggest hemodynamically significant amount of blood loss.  No indication for anticoagulation reversal at this time.  Suspect hematuria related to infection and trauma.  Will discuss with hospitalist for admission.  On reassessment, blood pressure dropped.  I performed a bedside echo, he does have decent EF but dilated IVC and peripheral edema concerning for volume overload.  I ordered a small amount of additional fluids but ordered Levophed as well as I suspect his blood pressure was dropping as we treat his urosepsis.  I discussed the patient with the critical care attending, plan for admission to the ICU overnight.  Patient updated on plan.   Patient's presentation is most consistent with acute presentation with potential threat to life or bodily function.           Final Clinical Impression(s) / ED Diagnoses Final diagnoses:  Sepsis, due to unspecified organism, unspecified whether acute organ dysfunction present [facility])    Rx / DC Orders ED Discharge Orders     None         Laurence Spates, MD 01/25/23 [phone]

## 2023-01-25 NOTE — Progress Notes (Signed)
ED Pharmacy Antibiotic Sign Off An antibiotic consult was received from an ED provider for Cefepime and Vancomycin per pharmacy dosing for Sepsis. A chart review was completed to assess appropriateness.   The following one time order(s) were placed:  Cefepime 2g IV x1 Vancomycin 1500mg  IV followed by 1000mg  IV for a total of 2500mg  loading dose  Further antibiotic and/or antibiotic pharmacy consults should be ordered by the admitting provider if indicated.   Thank you for allowing pharmacy to be a part of this patient's care.   Wilburn Cornelia, PharmD, BCPS Clinical Pharmacist 01/25/2023 7:00 PM   Please refer to [facility] for pharmacy phone number

## 2023-01-25 NOTE — ED Triage Notes (Signed)
Pt bib [facility] from [facility] after having a foley placed yesterday to get a UA and was taken out today with bleeding noticed. Pt had bleeding from his penis for 40 min. Is on a blood thinner. AOx4. Bed/wheelchair bound. On 3.5 LNC at baseline. Fever and tachy on arrival. Bleeding controlled

## 2023-01-25 NOTE — H&P (Addendum)
NAME:  Reginald [name], MRN:  [id number], DOB:  10-May-1936, LOS: 0 ADMISSION DATE:  01/25/2023, CONSULTATION DATE:  01/25/23  REFERRING MD:  EDP, CHIEF COMPLAINT:  hypotension   History of Present Illness:  87 yo snf pt with pmh CHF COPD htn cva, t2dm presented 2/2 hematuria on doac (eliquis) 2/2 afib. Upon presentation pt was confused, hypotensive that was initially responsive to IVF. His hematuria improved however he was febrile and despite volume remained hypotensive ultimately req pressor support to maintain BP.  Pt is relatively poor historian with unreliable history he does endorse n/v/d and Llquadrant pain that has been ongoing for approximately a week. He denies any other issues, stating he has no fever, has been eating well, no dysuria. He denies cp, sob, states he utilizes 3L oxygen at home and currently is on 5L with sats 97%. Much of history is from edp as well chart review.   Upon my evaluation pt appears fluid overloaded with escalated oxygen requirement bibasilar rales and pitting edema to thighs. He is on 7 mcg of levo at this time. Alert and in no acute distress.   Pertinent  Medical History  HFpEF COPD on 3L [place] at baseline HTN CVA R eye blindness t2dm  Significant Hospital Events: Including procedures, antibiotic start and stop dates in addition to other pertinent events   Admitted to ICU 7/17  Interim History / Subjective:    Objective   Blood pressure (!) 106/48, pulse (!) 109, temperature (!) 101.7 F (38.7 C), temperature source Rectal, resp. rate 16, SpO2 98%.       No intake or output data in the 24 hours ending 01/25/23 2327 There were no vitals filed for this visit.  Examination: General: nad, reclining comfortably in bed oxygen in place at 5L, slowed speech HENT: ncat, R eye with ptosis, unreactive pupil. L reactive and eomi Lungs: diminished bilaterally in bases, cta otherwise Cardiovascular: rrr  Abdomen: protuberant, non tender, mildly distended  with LL Q guarding, no rebound Extremities: +pitting edema to mid thigh, excoriations on anterior shin with erythema present, chronic venous stasis changes Neuro: alert, oriented to self and place only GU: deferred  Resolved Hospital Problem list   Hematuria  Assessment & Plan:  Shock presumed 2/2 sepsis 2/2 uti:  UTI, poa Sepsis, Poa Fever N/v Abdominal pain -ct abd negative for pathology but thickened bladder c/w cystitis.  -empiric abx -pan cx -ua + but no cultures sent yet, will re order -titrate vasopressors to map >65 -will order echo, limited as just had one in march  Acute on chronic hypoxic resp failure:  Copd, without acute exacerbation -would ultimately need diuresis with his edema -at this time holding with acute sepsis -suspect this will help with o2 requirement Acute toxic encephalopathy: -as above Hematuria:  Acute on chronic anemia -at this time no urgency in needed urology -making urine -will follow and should have decreased uop or noted hematuria will consult at that time   Lactic acidosis:  -given volume Troponin elevation:  -trend Hypokalemia -check mag -replace  H/o htn:  -holding home agents at this time with hypotension HFpEF: -limited echo for function T2dm:  -well controlled with last A1c although 2 years ago ~6 -ssi and will recheck  H/o cva R eye blindness:  -noted    Best Practice (right click and "Reselect all SmartList Selections" daily)   Diet/type: Regular consistency (see orders) DVT prophylaxis: prophylactic heparin  GI prophylaxis: N/A Lines: N/A Foley:  N/A Code Status:  DNR Last date of multidisciplinary goals of care discussion [pending contact with family. ]  Labs   CBC: Recent Labs  Lab 01/25/23 1824 01/25/23 1851  WBC 5.3  --   NEUTROABS 4.8  --   HGB 11.3* 13.3  HCT 40.2 39.0  MCV 88.0  --   PLT 257  --     Basic Metabolic Panel: Recent Labs  Lab 01/25/23 1824 01/25/23 1851  NA 142 144  K  3.3* 3.4*  CL 106  --   CO2 27  --   GLUCOSE 129*  --   BUN 18  --   CREATININE 1.35*  --   CALCIUM 8.7*  --    GFR: Estimated Creatinine Clearance: 52.4 mL/min (A) (by C-G formula based on SCr of 1.35 mg/dL (H)). Recent Labs  Lab 01/25/23 1824 01/25/23 1830 01/25/23 2204 01/25/23 2218  WBC 5.3  --   --   --   LATICACIDVEN  --  3.7* 2.2* 2.1*    Liver Function Tests: Recent Labs  Lab 01/25/23 1824  AST 25  ALT 21  ALKPHOS 95  BILITOT 0.6  PROT 5.9*  ALBUMIN 2.8*   Recent Labs  Lab 01/25/23 1841  LIPASE 34   No results for input(s): "AMMONIA" in the last 168 hours.  ABG    Component Value Date/Time   HCO3 26.9 01/25/2023 1851   TCO2 29 01/25/2023 1851   ACIDBASEDEF 2.0 01/25/2023 1851   O2SAT 89 01/25/2023 1851     Coagulation Profile: Recent Labs  Lab 01/25/23 1824  INR 1.4*    Cardiac Enzymes: No results for input(s): "CKTOTAL", "CKMB", "CKMBINDEX", "TROPONINI" in the last 168 hours.  HbA1C: Hgb A1c MFr Bld  Date/Time Value Ref Range Status  04/09/2021 06:45 AM 6.0 (H) 4.8 - 5.6 % Final    Comment:    (NOTE) Pre diabetes:          5.7%-6.4%  Diabetes:              >6.4%  Glycemic control for   <7.0% adults with diabetes   08/06/2020 04:33 AM 7.4 (H) 4.8 - 5.6 % Final    Comment:    (NOTE) Pre diabetes:          5.7%-6.4%  Diabetes:              >6.4%  Glycemic control for   <7.0% adults with diabetes     CBG: No results for input(s): "GLUCAP" in the last 168 hours.  Review of Systems:   Unreliable historian, otherwise as per HPI  Past Medical History:  He,  has a past medical history of Anteroseptal myocardial infarction [facility]), Carotid artery occlusion, CHF (congestive heart failure) (HCC), Chronic kidney disease, COPD (chronic obstructive pulmonary disease) (HCC), Coronary atherosclerosis of unspecified type of vessel, native or graft, Diverticulosis, ED (erectile dysfunction), Foley catheter in place, Glaucoma, Hematuria,  History of 2019 novel coronavirus disease (COVID-19) (11/2018), Hydronephrosis, bilateral, Hydroureter, Hypercholesteremia, Hypertensive renal disease, Hypogonadism male, Hypokalemia, Obesity hypoventilation syndrome (HCC), Obesity, unspecified, OSA (obstructive sleep apnea), Peripheral vascular disease (HCC), Permanent atrial fibrillation (HCC), Phimosis, Pneumonia, Rhabdomyolysis, Stroke (HCC), Tremor, Type II or unspecified type diabetes mellitus without mention of complication, not stated as uncontrolled, Unspecified essential hypertension, Urinary incontinence, Urinary retention, and Vestibulopathy.   Surgical History:   Past Surgical History:  Procedure Laterality Date   APPENDECTOMY  1985   CARDIAC CATHETERIZATION     CHOLECYSTECTOMY  06/2000   CIRCUMCISION N/A 05/28/2020   Procedure: CIRCUMCISION  ADULT;  Surgeon: Marcine Matar, MD;  Location: [facility] ORS;  Service: Urology;  Laterality: N/A;   CORONARY ANGIOPLASTY  08/14/1995   stent placement to LAD    DORSAL SLIT N/A 05/28/2020   Procedure: DORSAL SLIT;  Surgeon: Marcine Matar, MD;  Location: [facility] ORS;  Service: Urology;  Laterality: N/A;  45 MINS   ENDOVENOUS ABLATION SAPHENOUS VEIN W/ LASER Left 02/21/2018   endovenous laser ablation L GSV by Fabienne Bruns MD    ENTEROSCOPY N/A 11/28/2022   Procedure: ENTEROSCOPY;  Surgeon: Benancio Deeds, MD;  Location: [facility] ENDOSCOPY;  Service: Gastroenterology;  Laterality: N/A;   HOT HEMOSTASIS N/A 11/28/2022   Procedure: HOT HEMOSTASIS (ARGON PLASMA COAGULATION/BICAP);  Surgeon: Benancio Deeds, MD;  Location: [facility] ENDOSCOPY;  Service: Gastroenterology;  Laterality: N/A;   INGUINAL HERNIA REPAIR Right 1985   KNEE ARTHROSCOPY Left 1991   LUMBAR FUSION     NOSE SURGERY  1970s   Per Dr. Sharyn Lull [facility] in pt chart   TRANSCAROTID ARTERY REVASCULARIZATION  Right 06/12/2020   Procedure: RIGHT TRANSCAROTID ARTERY REVASCULARIZATION;  Surgeon: Nada Libman, MD;  Location: [facility] OR;  Service:  Vascular;  Laterality: Right;     Social History:   reports that he has never smoked. He has never been exposed to tobacco smoke. He has never used smokeless tobacco. He reports that he does not drink alcohol and does not use drugs.   Family History:  His family history includes Breast cancer in his maternal aunt; COPD in his mother; Colon cancer in his maternal aunt; Diabetes in his father, paternal grandmother, and sister; Glaucoma in an other family member; Heart disease in his brother, brother, father, and mother; Ovarian cancer in his daughter.   Allergies Allergies  Allergen Reactions   Adhesive [Tape] Rash     Home Medications  Prior to Admission medications   Medication Sig Start Date End Date Taking? Authorizing Provider  acetaminophen (TYLENOL) 500 MG tablet Take 500 mg by mouth every 8 (eight) hours as needed (pain).   Yes [provider]  albuterol (VENTOLIN HFA) 108 (90 Base) MCG/ACT inhaler Inhale 2 puffs into the lungs 2 (two) times daily as needed for wheezing or shortness of breath. 07/03/21  Yes Marrion Coy, MD  alum & mag hydroxide-simeth (MAALOX/MYLANTA) 200-200-20 MG/5 SUSP Apply 10 mLs topically every 6 (six) hours as needed (GERD symptoms).   Yes [provider]  apixaban (ELIQUIS) 5 MG TABS tablet Take 1 tablet (5 mg total) by mouth 2 (two) times daily. 12/03/22  Yes Ghimire, Werner Lean, MD  atorvastatin (LIPITOR) 40 MG tablet Take 40 mg by mouth at bedtime.   Yes [provider]  atropine 1 % ophthalmic solution Place 1 drop into the right eye daily. 06/09/22  Yes [provider]  chlorhexidine (PERIDEX) 0.12 % solution Use as directed 15 mLs in the mouth or throat 2 (two) times daily.   Yes [provider]  Cholecalciferol (VITAMIN D-3) 25 MCG (1000 UT) CAPS Take 2,000 Units by mouth daily.   Yes [provider]  dorzolamide-timolol (COSOPT) 22.3-6.8 MG/ML ophthalmic solution Place 1 drop into both eyes 2 (two)  times daily.   Yes [provider]  Dulaglutide (TRULICITY) 3 MG/0.5ML SOPN Inject 3 mg into the skin every Monday.   Yes [provider]  ferrous gluconate (FERGON) 324 MG tablet Take 324 mg by mouth at bedtime.   Yes [provider]  furosemide (LASIX) 20 MG tablet Take 20 mg by mouth  daily. 11/17/22  Yes [provider]  gabapentin (NEURONTIN) 100 MG capsule Take 2 capsules (200 mg total) by mouth at bedtime as needed. Patient taking differently: Take 200 mg by mouth at bedtime. 08/07/20  Yes Leeroy Bock, MD  ipratropium (ATROVENT) 0.02 % nebulizer solution Take 0.5 mg by nebulization every 6 (six) hours as needed for wheezing or shortness of breath.   Yes [provider]  latanoprost (XALATAN) 0.005 % ophthalmic solution Place 1 drop into the left eye at bedtime. 01/03/17  Yes [provider]  losartan (COZAAR) 50 MG tablet Take 50 mg by mouth in the morning and at bedtime.   Yes [provider]  magnesium oxide (MAG-OX) 400 MG tablet Take 400 mg by mouth daily.   Yes [provider]  Menthol, Topical Analgesic, (BIOFREEZE) 4 % GEL Apply 1 application  topically in the morning and at bedtime. To neck and shoulder   Yes [provider]  metoprolol succinate (TOPROL XL) 25 MG 24 hr tablet Take 0.5 tablets (12.5 mg total) by mouth daily. 12/02/22 12/02/23 Yes Ghimire, Werner Lean, MD  nitroGLYCERIN (NITROSTAT) 0.4 MG SL tablet Place 0.4 mg under the tongue every 5 (five) minutes x 3 doses as needed for chest pain (and CALL PROVIDER IF NO RELIEF AFTER THE 3RD DOSE).   Yes [provider]  pantoprazole (PROTONIX) 40 MG tablet Take 1 tablet (40 mg total) by mouth 2 (two) times daily. 12/02/22  Yes Ghimire, Werner Lean, MD  Polyethyl Glyc-Propyl Glyc PF (SYSTANE PRESERVATIVE FREE) 0.4-0.3 % SOLN Place 1 drop into both eyes 3 (three) times daily.   Yes [provider]  polyethylene glycol (MIRALAX / GLYCOLAX)  17 g packet Take 17 g by mouth daily.   Yes [provider]  prednisoLONE acetate (PRED FORTE) 1 % ophthalmic suspension Place 1 drop into the right eye 2 (two) times daily. 06/09/22  Yes [provider]  sennosides-docusate sodium (SENOKOT-S) 8.6-50 MG tablet Take 2 tablets by mouth 2 (two) times daily.   Yes [provider]  sertraline (ZOLOFT) 25 MG tablet Take 25 mg by mouth daily. 11/14/22  Yes [provider]  SPIRIVA RESPIMAT 2.5 MCG/ACT AERS Inhale 1 each into the lungs daily. 11/22/22  Yes [provider]  tamsulosin (FLOMAX) 0.4 MG CAPS capsule Take 1 capsule (0.4 mg total) by mouth daily after supper. 12/25/18  Yes Hall, Carole N, DO  VASCEPA 1 g capsule Take 1 g by mouth every evening.   Yes [provider]  vitamin B-12 (CYANOCOBALAMIN) 1000 MCG tablet Take 1,000 mcg by mouth daily.   Yes [provider]  Zinc Oxide (DESITIN) 40 % PSTE Apply 1 Application topically See admin instructions. Apply topically to sacrum and buttocks every shift and as needed for skin protection.   Yes [provider]  OXYGEN Inhale 2 L into the lungs at bedtime.    [provider]     Critical care time: excluding procedures.

## 2023-01-26 ENCOUNTER — Inpatient Hospital Stay (HOSPITAL_COMMUNITY): Payer: Medicare Other

## 2023-01-26 DIAGNOSIS — I5031 Acute diastolic (congestive) heart failure: Secondary | ICD-10-CM | POA: Diagnosis not present

## 2023-01-26 DIAGNOSIS — A419 Sepsis, unspecified organism: Secondary | ICD-10-CM | POA: Diagnosis not present

## 2023-01-26 LAB — HEMOGLOBIN AND HEMATOCRIT, BLOOD
HCT: 34.5 % — ABNORMAL LOW (ref 39.0–52.0)
HCT: 32.7 % — ABNORMAL LOW (ref 39.0–52.0)
Hemoglobin: 9.8 g/dL — ABNORMAL LOW (ref 13.0–17.0)
Hemoglobin: 9.3 g/dL — ABNORMAL LOW (ref 13.0–17.0)

## 2023-01-26 LAB — LACTIC ACID, PLASMA
Lactic Acid, Venous: 2.4 mmol/L (ref 0.5–1.9)
Lactic Acid, Venous: 2.2 mmol/L (ref 0.5–1.9)

## 2023-01-26 LAB — BLOOD CULTURE ID PANEL (REFLEXED) - BCID2

## 2023-01-26 LAB — CBC WITH DIFFERENTIAL/PLATELET
Abs Immature Granulocytes: 0.11 10*3/uL — ABNORMAL HIGH (ref 0.00–0.07)
Basophils Absolute: 0.1 10*3/uL (ref 0.0–0.1)
Basophils Relative: 0 %
Eosinophils Absolute: 0.1 10*3/uL (ref 0.0–0.5)
Eosinophils Relative: 1 %
HCT: 32.8 % — ABNORMAL LOW (ref 39.0–52.0)
Hemoglobin: 9.4 g/dL — ABNORMAL LOW (ref 13.0–17.0)
Immature Granulocytes: 1 %
Lymphocytes Relative: 5 %
Lymphs Abs: 1.1 10*3/uL (ref 0.7–4.0)
MCH: 24.5 pg — ABNORMAL LOW (ref 26.0–34.0)
MCHC: 28.7 g/dL — ABNORMAL LOW (ref 30.0–36.0)
MCV: 85.4 fL (ref 80.0–100.0)
Monocytes Absolute: 1.6 10*3/uL — ABNORMAL HIGH (ref 0.1–1.0)
Monocytes Relative: 7 %
Neutro Abs: 18.9 10*3/uL — ABNORMAL HIGH (ref 1.7–7.7)
Neutrophils Relative %: 86 %
Platelets: 222 10*3/uL (ref 150–400)
RBC: 3.84 MIL/uL — ABNORMAL LOW (ref 4.22–5.81)
RDW: 19.5 % — ABNORMAL HIGH (ref 11.5–15.5)
WBC: 21.9 10*3/uL — ABNORMAL HIGH (ref 4.0–10.5)
nRBC: 0 % (ref 0.0–0.2)

## 2023-01-26 LAB — GLUCOSE, CAPILLARY
Glucose-Capillary: 119 mg/dL — ABNORMAL HIGH (ref 70–99)
Glucose-Capillary: 130 mg/dL — ABNORMAL HIGH (ref 70–99)
Glucose-Capillary: 138 mg/dL — ABNORMAL HIGH (ref 70–99)
Glucose-Capillary: 144 mg/dL — ABNORMAL HIGH (ref 70–99)
Glucose-Capillary: 203 mg/dL — ABNORMAL HIGH (ref 70–99)

## 2023-01-26 LAB — BASIC METABOLIC PANEL
Anion gap: 11 (ref 5–15)
Anion gap: 7 (ref 5–15)
BUN: 22 mg/dL (ref 8–23)
BUN: 23 mg/dL (ref 8–23)
CO2: 23 mmol/L (ref 22–32)
CO2: 24 mmol/L (ref 22–32)
Calcium: 8.2 mg/dL — ABNORMAL LOW (ref 8.9–10.3)
Calcium: 8.1 mg/dL — ABNORMAL LOW (ref 8.9–10.3)
Chloride: 106 mmol/L (ref 98–111)
Chloride: 106 mmol/L (ref 98–111)
Creatinine, Ser: 1.4 mg/dL — ABNORMAL HIGH (ref 0.61–1.24)
Creatinine, Ser: 1.57 mg/dL — ABNORMAL HIGH (ref 0.61–1.24)
GFR, Estimated: 50 mL/min — ABNORMAL LOW (ref >60)
GFR, Estimated: 43 mL/min — ABNORMAL LOW (ref >60)
Glucose, Bld: 212 mg/dL — ABNORMAL HIGH (ref 70–99)
Glucose, Bld: 140 mg/dL — ABNORMAL HIGH (ref 70–99)
Potassium: 3.2 mmol/L — ABNORMAL LOW (ref 3.5–5.1)
Potassium: 3.7 mmol/L (ref 3.5–5.1)
Sodium: 140 mmol/L (ref 135–145)
Sodium: 137 mmol/L (ref 135–145)

## 2023-01-26 LAB — CBC
HCT: 31.5 % — ABNORMAL LOW (ref 39.0–52.0)
Hemoglobin: 9 g/dL — ABNORMAL LOW (ref 13.0–17.0)
MCH: 24.3 pg — ABNORMAL LOW (ref 26.0–34.0)
MCHC: 28.6 g/dL — ABNORMAL LOW (ref 30.0–36.0)
MCV: 85.1 fL (ref 80.0–100.0)
Platelets: 248 10*3/uL (ref 150–400)
RBC: 3.7 MIL/uL — ABNORMAL LOW (ref 4.22–5.81)
RDW: 19.5 % — ABNORMAL HIGH (ref 11.5–15.5)
WBC: 28.2 10*3/uL — ABNORMAL HIGH (ref 4.0–10.5)
nRBC: 0 % (ref 0.0–0.2)

## 2023-01-26 LAB — MAGNESIUM
Magnesium: 1.5 mg/dL — ABNORMAL LOW (ref 1.7–2.4)
Magnesium: 2 mg/dL (ref 1.7–2.4)

## 2023-01-26 LAB — TROPONIN I (HIGH SENSITIVITY)
Troponin I (High Sensitivity): 69 ng/L — ABNORMAL HIGH (ref <18)
Troponin I (High Sensitivity): 69 ng/L — ABNORMAL HIGH (ref <18)

## 2023-01-26 LAB — ECHOCARDIOGRAM LIMITED
S' Lateral: 3.1 cm
Weight: 4303.38 oz

## 2023-01-26 LAB — MRSA NEXT GEN BY PCR, NASAL: MRSA by PCR Next Gen: DETECTED — AB

## 2023-01-26 LAB — PHOSPHORUS: Phosphorus: 2.9 mg/dL (ref 2.5–4.6)

## 2023-01-26 MED ORDER — SODIUM CHLORIDE 0.9 % IV SOLN
2.0000 g | INTRAVENOUS | Status: DC
  Administered 2023-01-26 – 2023-01-29 (×4): 2 g via INTRAVENOUS
  Filled 2023-01-26 (×4): qty 20

## 2023-01-26 MED ORDER — MUPIROCIN 2 % EX OINT
1.0000 | TOPICAL_OINTMENT | Freq: Two times a day (BID) | CUTANEOUS | Status: DC
  Administered 2023-01-26 – 2023-01-30 (×8): 1 via NASAL
  Filled 2023-01-26 (×4): qty 22

## 2023-01-26 MED ORDER — TAMSULOSIN HCL 0.4 MG PO CAPS
0.4000 mg | ORAL_CAPSULE | Freq: Every day | ORAL | Status: DC
  Administered 2023-01-26 – 2023-01-29 (×4): 0.4 mg via ORAL
  Filled 2023-01-26 (×4): qty 1

## 2023-01-26 MED ORDER — CHLORHEXIDINE GLUCONATE CLOTH 2 % EX PADS
6.0000 | MEDICATED_PAD | Freq: Every day | CUTANEOUS | Status: DC
  Administered 2023-01-26 – 2023-01-30 (×5): 6 via TOPICAL

## 2023-01-26 MED ORDER — GERHARDT'S BUTT CREAM
TOPICAL_CREAM | Freq: Three times a day (TID) | CUTANEOUS | Status: DC
  Administered 2023-01-26 – 2023-01-27 (×2): 1 via TOPICAL
  Filled 2023-01-26: qty 1

## 2023-01-26 MED ORDER — UMECLIDINIUM BROMIDE 62.5 MCG/ACT IN AEPB
1.0000 | INHALATION_SPRAY | Freq: Every day | RESPIRATORY_TRACT | Status: DC
  Administered 2023-01-26 – 2023-01-30 (×5): 1 via RESPIRATORY_TRACT
  Filled 2023-01-26: qty 7

## 2023-01-26 MED ORDER — SODIUM CHLORIDE 0.9 % IV SOLN
2.0000 g | Freq: Two times a day (BID) | INTRAVENOUS | Status: DC
  Administered 2023-01-26: 2 g via INTRAVENOUS
  Filled 2023-01-26 (×2): qty 12.5

## 2023-01-26 MED ORDER — INSULIN ASPART 100 UNIT/ML IJ SOLN
0.0000 [IU] | Freq: Every day | INTRAMUSCULAR | Status: DC
  Administered 2023-01-26: 2 [IU] via SUBCUTANEOUS

## 2023-01-26 MED ORDER — ALBUTEROL SULFATE (2.5 MG/3ML) 0.083% IN NEBU: 3.0000 mL | INHALATION_SOLUTION | Freq: Two times a day (BID) | RESPIRATORY_TRACT | Status: DC | PRN

## 2023-01-26 MED ORDER — METRONIDAZOLE 500 MG/100ML IV SOLN
500.0000 mg | Freq: Two times a day (BID) | INTRAVENOUS | Status: DC
  Administered 2023-01-26: 500 mg via INTRAVENOUS
  Filled 2023-01-26 (×2): qty 100

## 2023-01-26 MED ORDER — HYDROCORTISONE 1 % EX CREA
TOPICAL_CREAM | Freq: Two times a day (BID) | CUTANEOUS | Status: DC
  Administered 2023-01-26: 1 via TOPICAL
  Filled 2023-01-26: qty 28

## 2023-01-26 MED ORDER — ORAL CARE MOUTH RINSE: 15.0000 mL | OROMUCOSAL | Status: DC | PRN

## 2023-01-26 MED ORDER — POTASSIUM CHLORIDE CRYS ER 20 MEQ PO TBCR
20.0000 meq | EXTENDED_RELEASE_TABLET | ORAL | Status: AC
  Administered 2023-01-26 (×2): 20 meq via ORAL
  Filled 2023-01-26 (×2): qty 1

## 2023-01-26 MED ORDER — POTASSIUM CHLORIDE 10 MEQ/100ML IV SOLN
10.0000 meq | INTRAVENOUS | Status: AC
  Administered 2023-01-26 (×4): 10 meq via INTRAVENOUS
  Filled 2023-01-26 (×4): qty 100

## 2023-01-26 MED ORDER — PANTOPRAZOLE SODIUM 40 MG PO TBEC
40.0000 mg | DELAYED_RELEASE_TABLET | Freq: Two times a day (BID) | ORAL | Status: DC
  Administered 2023-01-26 – 2023-01-30 (×10): 40 mg via ORAL
  Filled 2023-01-26 (×10): qty 1

## 2023-01-26 MED ORDER — MAGNESIUM SULFATE 4 GM/100ML IV SOLN
4.0000 g | Freq: Once | INTRAVENOUS | Status: AC
  Administered 2023-01-26: 4 g via INTRAVENOUS
  Filled 2023-01-26: qty 100

## 2023-01-26 MED ORDER — INSULIN ASPART 100 UNIT/ML IJ SOLN
0.0000 [IU] | Freq: Three times a day (TID) | INTRAMUSCULAR | Status: DC
  Administered 2023-01-26 – 2023-01-29 (×7): 2 [IU] via SUBCUTANEOUS

## 2023-01-26 MED ORDER — SERTRALINE HCL 50 MG PO TABS
25.0000 mg | ORAL_TABLET | Freq: Every day | ORAL | Status: DC
  Administered 2023-01-26 – 2023-01-30 (×5): 25 mg via ORAL
  Filled 2023-01-26 (×5): qty 1

## 2023-01-26 MED ORDER — CHLORHEXIDINE GLUCONATE CLOTH 2 % EX PADS
6.0000 | MEDICATED_PAD | Freq: Every day | CUTANEOUS | Status: DC
  Administered 2023-01-28 – 2023-01-30 (×3): 6 via TOPICAL

## 2023-01-26 NOTE — Progress Notes (Signed)
[facility] ADULT ICU REPLACEMENT PROTOCOL   The patient does apply for the [facility] Adult ICU Electrolyte Replacment Protocol based on the criteria listed below:   1.Exclusion criteria: TCTS, ECMO, Dialysis, and Myasthenia Gravis patients 2. Is GFR >/= 30 ml/min? Yes.    Patient's GFR today is 50 3. Is SCr </= 2? Yes.   Patient's SCr is 1.4 mg/dL 4. Did SCr increase >/= 0.5 in 24 hours? Yes.   5.Pt's weight >40kg  No. 6. Abnormal electrolyte(s): K 3.2, mag 1.5  7. Electrolytes replaced per protocol 8.  Call MD STAT for K+ </= 2.5, Phos </= 1, or Mag </= 1 Physician:    Markus Daft A 01/26/2023 5:19 AM

## 2023-01-26 NOTE — Progress Notes (Signed)
eLink Physician-Brief Progress Note Patient Name: Reginald [name] DOB: 06-Jun-1936 MRN: [id number]   Date of Service  01/26/2023  HPI/Events of Note  Patient admitted to the ICU with significant hematuria, he is on Elquis and had a Foley catheter placed yesterday, on removal today it was followed by bleeding.  eICU Interventions  New Patient Evaluation.        [name] [name] 01/26/2023, 1:17 AM

## 2023-01-26 NOTE — Progress Notes (Signed)
PHARMACY - PHYSICIAN COMMUNICATION CRITICAL VALUE ALERT - BLOOD CULTURE IDENTIFICATION (BCID)  Reginald [name] is an 87 y.o. male who presented to [facility] on 01/25/2023 with a chief complaint of confused and hypotensive   Assessment:  E coli in 4 of 4 bottles  Name of physician (or Provider) Contacted: Rounding clinical Rph to speak with provider during rounds  Current antibiotics: Vancomycin, flagyl, cefepime  Changes to prescribed antibiotics recommended: D/C vancomycin, and change cefepime to ceftriaxone   Results for orders placed or performed during the hospital encounter of 01/25/23  Blood Culture ID Panel (Reflexed) (Collected: 01/25/2023  6:29 PM)  Result Value Ref Range   Enterococcus faecalis NOT DETECTED NOT DETECTED   Enterococcus Faecium NOT DETECTED NOT DETECTED   Listeria monocytogenes NOT DETECTED NOT DETECTED   Staphylococcus species NOT DETECTED NOT DETECTED   Staphylococcus aureus (BCID) NOT DETECTED NOT DETECTED   Staphylococcus epidermidis NOT DETECTED NOT DETECTED   Staphylococcus lugdunensis NOT DETECTED NOT DETECTED   Streptococcus species NOT DETECTED NOT DETECTED   Streptococcus agalactiae NOT DETECTED NOT DETECTED   Streptococcus pneumoniae NOT DETECTED NOT DETECTED   Streptococcus pyogenes NOT DETECTED NOT DETECTED   A.calcoaceticus-baumannii NOT DETECTED NOT DETECTED   Bacteroides fragilis NOT DETECTED NOT DETECTED   Enterobacterales DETECTED (A) NOT DETECTED   Enterobacter cloacae complex NOT DETECTED NOT DETECTED   Escherichia coli DETECTED (A) NOT DETECTED   Klebsiella aerogenes NOT DETECTED NOT DETECTED   Klebsiella oxytoca NOT DETECTED NOT DETECTED   Klebsiella pneumoniae NOT DETECTED NOT DETECTED   Proteus species NOT DETECTED NOT DETECTED   Salmonella species NOT DETECTED NOT DETECTED   Serratia marcescens NOT DETECTED NOT DETECTED   Haemophilus influenzae NOT DETECTED NOT DETECTED   Neisseria meningitidis NOT DETECTED NOT DETECTED    Pseudomonas aeruginosa NOT DETECTED NOT DETECTED   Stenotrophomonas maltophilia NOT DETECTED NOT DETECTED   Candida albicans NOT DETECTED NOT DETECTED   Candida auris NOT DETECTED NOT DETECTED   Candida glabrata NOT DETECTED NOT DETECTED   Candida krusei NOT DETECTED NOT DETECTED   Candida parapsilosis NOT DETECTED NOT DETECTED   Candida tropicalis NOT DETECTED NOT DETECTED   Cryptococcus neoformans/gattii NOT DETECTED NOT DETECTED   CTX-M ESBL NOT DETECTED NOT DETECTED   Carbapenem resistance IMP NOT DETECTED NOT DETECTED   Carbapenem resistance KPC NOT DETECTED NOT DETECTED   Carbapenem resistance NDM NOT DETECTED NOT DETECTED   Carbapenem resist OXA 48 LIKE NOT DETECTED NOT DETECTED   Carbapenem resistance VIM NOT DETECTED NOT DETECTED    Alinda Money L Jaiana Sheffer 01/26/2023  7:22 AM

## 2023-01-26 NOTE — TOC Initial Note (Signed)
Transition of Care [facility]) - Initial/Assessment Note    Patient Details  Name: Reginald [name] MRN: [id number] Date of Birth: 01-Jul-1936  [facility]) CM/SW Contact:    Harriet Masson, RN Phone Number: 01/26/2023, 2:31 PM  Clinical Narrative:                 Spoke to son, Berna Spare, who states patient lives at [facility] and will need PTAR at discharge.  Patient on Levophed and iv abx.  TOC following.  Expected Discharge Plan: Long Term Nursing Home Barriers to Discharge: Continued Medical Work up   Patient Goals and CMS Choice Patient states their goals for this hospitalization and ongoing recovery are:: return to [facility] [email or web address] Compare Post Acute Care list provided to:: Patient Represenative (must comment) Choice offered to / list presented to : Adult Children      Expected Discharge Plan and Services     Post Acute Care Choice: Skilled Nursing Facility Living arrangements for the past 2 months: Skilled Nursing Facility                                      Prior Living Arrangements/Services Living arrangements for the past 2 months: Skilled Nursing Facility   Patient language and need for interpreter reviewed:: Yes Do you feel safe going back to the place where you live?: Yes      Need for Family Participation in Patient Care: Yes (Comment) Care giver support system in place?: Yes (comment)   Criminal Activity/Legal Involvement Pertinent to Current Situation/Hospitalization: No - Comment as needed  Activities of Daily Living      Permission Sought/Granted                  Emotional Assessment         Alcohol / Substance Use: Not Applicable Psych Involvement: No (comment)  Admission diagnosis:  Hypotension [I95.9] Sepsis, due to unspecified organism, unspecified whether acute organ dysfunction present [facility]) [A41.9] Patient Active Problem List   Diagnosis Date Noted   Hypotension 01/25/2023   Anemia 11/28/2022    Chronic obstructive pulmonary disease (HCC) 11/28/2022   Chronic hypoxic respiratory failure (HCC) 11/28/2022   Anemia, posthemorrhagic, acute 11/28/2022   Anticoagulated 11/28/2022   AVM (arteriovenous malformation) of small bowel, acquired 11/28/2022   AVM (arteriovenous malformation) of stomach, acquired 11/28/2022   Upper GI bleed 11/27/2022   Gastroesophageal reflux disease 09/14/2022   Atypical chest pain 09/13/2022   Chest pain 07/01/2021   History of CVA with residual deficit 07/01/2021   Pneumothorax 04/08/2021   Stroke (HCC) 08/05/2020   Vision loss of right eye 06/09/2020   Acute on chronic renal failure (HCC) 12/19/2018   Acute renal failure superimposed on stage 4 chronic kidney disease (HCC) 12/19/2018   AKI (acute kidney injury) (HCC) 11/26/2018   HCAP (healthcare-associated pneumonia) 11/15/2018   COVID-19 11/15/2018   Chronic venous insufficiency 10/04/2017   Venous stasis ulcers of both lower extremities (HCC) 10/04/2017   Nonspecific chest pain 10/27/2016   Morbid obesity (HCC) 05/02/2016   Lymphedema 05/02/2016   Abnormal laboratory test result 10/15/2015   Cough 10/08/2015   Hematuria 09/21/2015   Hypokalemia 08/13/2015   Hypertension associated with diabetes (HCC) 08/13/2015   Multiple open wounds of lower leg 07/22/2015   CKD (chronic kidney disease) stage 3, GFR 30-59 ml/min (HCC) 07/22/2015   Cellulitis of leg, right 06/28/2015  Acute kidney injury superimposed on CKD (HCC) 06/28/2015   Hypertensive heart disease with CHF (congestive heart failure) (HCC) 05/19/2015   CAD (coronary artery disease), native coronary artery 05/19/2015   Hyperlipidemia associated with type 2 diabetes mellitus (HCC) 05/19/2015   Vitamin D deficiency 05/19/2015   Pressure ulcer 05/16/2015   Diastolic dysfunction with chronic heart failure (HCC) 05/08/2015   Blisters of multiple sites 05/08/2015   Type 2 diabetes mellitus (HCC)    Obesity, unspecified    OSA (obstructive  sleep apnea)    Permanent atrial fibrillation (HCC)    Secondary DM with CKD stage 4 and hypertension (HCC)    Glaucoma    Cellulitis 05/07/2015   PCP:  Karna Dupes, MD Pharmacy:   [facility] - [place], [place] - [address] [place] [address] Phone: [phone] Fax: [phone]  [facility] - [place], [place] - [address] [place] [address] [place] [address] [place] [address] Phone: [phone] Fax: [phone]     Social Determinants of Health (SDOH) Social History: SDOH Screenings   Food Insecurity: No Food Insecurity (12/01/2022)  Housing: Low Risk  (12/01/2022)  Transportation Needs: No Transportation Needs (12/01/2022)  Utilities: Not At Risk (12/01/2022)  Depression (PHQ2-9): Low Risk  (07/20/2020)  Tobacco Use: Low Risk  (01/17/2023)   SDOH Interventions:     Readmission Risk Interventions     No data to display

## 2023-01-26 NOTE — Progress Notes (Signed)
Echocardiogram 2D Echocardiogram has been performed.  Lucendia Herrlich 01/26/2023, 9:40 AM

## 2023-01-26 NOTE — Progress Notes (Signed)
NAME:  Reginald [name], MRN:  [id number], DOB:  [birth year], LOS: 1 ADMISSION DATE:  01/25/2023, CONSULTATION DATE:  01/25/23  REFERRING MD:  EDP, CHIEF COMPLAINT:  hypotension   History of Present Illness:  87 yo snf pt with pmh CHF COPD htn cva, t2dm presented 2/2 hematuria on doac (eliquis) 2/2 afib. Upon presentation pt was confused, hypotensive that was initially responsive to IVF. His hematuria improved however he was febrile and despite volume remained hypotensive ultimately req pressor support to maintain BP.  Pt is relatively poor historian with unreliable history he does endorse n/v/d and Llquadrant pain that has been ongoing for approximately a week. He denies any other issues, stating he has no fever, has been eating well, no dysuria. He denies cp, sob, states he utilizes 3L oxygen at home and currently is on 5L with sats 97%. Much of history is from edp as well chart review.   Upon my evaluation pt appears fluid overloaded with escalated oxygen requirement bibasilar rales and pitting edema to thighs. He is on 7 mcg of levo at this time. Alert and in no acute distress.   Pertinent  Medical History  HFpEF COPD on 3L  at baseline HTN CVA R eye blindness T2dm Paroxysmal atrial fibrillation  Significant Hospital Events: Including procedures, antibiotic start and stop dates in addition to other pertinent events   Admitted to ICU 7/17  Interim History / Subjective:   Overnight: Patient started having bright red blood per rectum.  Patient evaluated bedside this morning.  He states he is having bright red blood per rectum.  He denies any shortness of breath or chest pain.  He states he otherwise is doing well.  He denies any abdominal pain.  He does report having some rectal pain.  Objective   Blood pressure (!) 86/65, pulse 72, temperature 98.1 F (36.7 C), temperature source Oral, resp. rate 19, weight 122 kg, SpO2 100%.  Temperature febrile to 103.5 F yesterday at 6 PM.   Respiration rate 13-15, pulse 70s, satting 100% on 4L nasal cannula. MAP 62-70 on 5 of levo         Intake/Output Summary (Last 24 hours) at 01/26/2023 0833 Last data filed at 01/26/2023 3086 Gross per 24 hour  Intake 1135.64 ml  Output 75 ml  Net 1060.64 ml   Filed Weights   01/26/23 0500  Weight: 122 kg    Examination: General: Patient resting comfortably in bed in no acute distress Eyes: Left pupil equal and reactive to light, right pupil circular, with no reaction, no conjunctival pallor appreciated Head: Normocephalic, atraumatic  Cardio: Regular rate and rhythm, no murmurs, rubs or gallops. 2+ pulses to bilateral upper and lower extremities  Pulmonary: Diminished breath sounds bilaterally Abdomen: Soft, nontender, normoactive bowel sounds Neuro: Alert and orientated x3.  Extremities: 2+ pitting edema bilaterally Rectal: Rectal exam with chaperone present, showing no external hemorrhoids, with bright red blood noted, no fissures appreciated    Labs reviewed Urine culture pending Troponin 69, up from 50, Phosphorus 2.9 Magnesium 1.5 BMP sodium 140, potassium 3.2, glucose 212, creatinine 1.40  CT abdomen pelvis with contrast 1. Small pleural effusion on the left and trace pleural effusion on the right with atelectasis or infiltrate at the lung bases bilaterally. 2. Mild diffuse bladder wall thickening, suggesting cystitis. 3. Nonobstructive right renal calculus. 4. Coronary artery calcifications and aortic atherosclerosis.  Resolved Hospital Problem list   Hematuria  Assessment & Plan:   This is a 87 year old male with past  medical history of hypertension, HFpEF, chronic anemia, type 2 diabetes who presents the emergency department with concerns of abdominal pain and nausea, vomiting, diarrhea.  Patient had to have concern for urosepsis with septic shock and admitted for further evaluation and management.  #Septic shock secondary to urosepsis #E. coli  bacteremia #Leukocytosis #Hematuria Blood cultures positive for E. coli.  Likely seeded from his urinary tract infection.  Patient is currently on cefepime and Flagyl.  Levophed is decreasing to 3.  Patient is bleeding from his rectum as well.  Will continue to treat with antibiotics.  Will continue to follow cultures. -Wean Levophed as tolerated to keep MAP greater than 65 -Will narrow to ceftriaxone day 1 -Monitor white count -Monitor fever curve -Follow cultures  #Acute encephalopathy Likely in the setting of sepsis.  Could also be having ICU delirium.  Patient is able to be reoriented. -Continue antibiotic treatment as above -ICU delirium precautions  #Rectal bleeding Overnight, patient did develop rectal bleeding.  Bright red blood per rectum appreciated on my exam.  Did do rectal exam bedside with nurse present with some bright red blood noted.  No hemorrhoids appreciated.  Patient did have drop in hemoglobin, and rising white count.  Will get stat CBC.  Holding anticoagulation at this time.  Patient does have history of diverticulosis documented, but no diverticulitis appreciated on imaging.  Patient has no documented history of Crohn's or UC.  Patient does report rectal pain, and could be an anal fissure but not visualized on exam. -Stat CBC -Monitor for further bleeding -Hold anticoagulation -Continue Protonix 40 mg twice daily -If patient is having brisk bleed, will need to proceed with CTA GI bleed protocol -Give preparation H   #Acute on chronic hypoxic respiratory failure Patient is having increased O2 requirements and is right now on 4L nasal canula. This could be in the setting of underlying sepsis and can be in the setting of fluid overload.  -Wean back down to home 3 liters as tolerated -Can diuresis  -Protecting airway well   #Elevated troponin Likely secondary to demand ischemia.  No acute concern for ACS at this time.  Troponins flat now -Trend  troponins  #HFpEF #Ischemic cardiomyopathy Does have increasing oxygen requirements.  Patient is edematous.  Could have acute exacerbation. -Could benefit from diuresing -Repeat echo pending -Resume home Lipitor 40 mg nightly  #Paroxysmal atrial fibrillation Patient is currently in A-fib rhythm.  Patient is rate controlled. Home medications include Eliquis 5 mg twice daily, metoprolol succinate 12.5 mg daily.  No acute concern for A-fib with RVR at this time. -Hold metoprolol succinate in the setting of hypotension -Monitor on telemetry -Hold Eliquis in the setting of concern for bleeding  #Type 2 diabetes mellitus Repeat A1c pending. -Continue SSI  #Electrolyte derangements Replete and recheck potassium and magnesium.  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 No acute concerns at this moment from this aspect/.  -Continue to monitor    Best Practice (right click and "Reselect all SmartList Selections" daily)   Diet/type: Regular consistency (see orders) DVT prophylaxis: prophylactic heparin  GI prophylaxis: N/A Lines: N/A Foley:  N/A Code Status:  DNR Last date of multidisciplinary goals of care discussion [pending contact with family. ]  Labs   CBC: Recent Labs  Lab 01/25/23 1824 01/25/23 1851 01/26/23 0402 01/26/23 0728  WBC 5.3  --  28.2* 21.9*  NEUTROABS 4.8  --   --  18.9*  HGB 11.3* 13.3 9.0* 9.4*  HCT 40.2 39.0 31.5* 32.8*  MCV 88.0  --  85.1 85.4  PLT 257  --  248 222    Basic Metabolic Panel: Recent Labs  Lab 01/25/23 1824 01/25/23 1851 01/26/23 0402  NA 142 144 140  K 3.3* 3.4* 3.2*  CL 106  --  106  CO2 27  --  23  GLUCOSE 129*  --  212*  BUN 18  --  22  CREATININE 1.35*  --  1.40*  CALCIUM 8.7*  --  8.2*  MG  --   --  1.5*  PHOS  --   --  2.9   GFR: Estimated Creatinine Clearance: 52.2 mL/min (A) (by C-G formula based on SCr of 1.4 mg/dL  (H)). Recent Labs  Lab 01/25/23 1824 01/25/23 1830 01/25/23 2204 01/25/23 2218 01/26/23 0023 01/26/23 0402 01/26/23 0728  WBC 5.3  --   --   --   --  28.2* 21.9*  LATICACIDVEN  --  3.7* 2.2* 2.1* 2.4*  --   --     Liver Function Tests: Recent Labs  Lab 01/25/23 1824  AST 25  ALT 21  ALKPHOS 95  BILITOT 0.6  PROT 5.9*  ALBUMIN 2.8*   Recent Labs  Lab 01/25/23 1841  LIPASE 34   No results for input(s): "AMMONIA" in the last 168 hours.  ABG    Component Value Date/Time   HCO3 26.9 01/25/2023 1851   TCO2 29 01/25/2023 1851   ACIDBASEDEF 2.0 01/25/2023 1851   O2SAT 89 01/25/2023 1851     Coagulation Profile: Recent Labs  Lab 01/25/23 1824  INR 1.4*    Cardiac Enzymes: No results for input(s): "CKTOTAL", "CKMB", "CKMBINDEX", "TROPONINI" in the last 168 hours.  HbA1C: Hgb A1c MFr Bld  Date/Time Value Ref Range Status  04/09/2021 06:45 AM 6.0 (H) 4.8 - 5.6 % Final    Comment:    (NOTE) Pre diabetes:          5.7%-6.4%  Diabetes:              >6.4%  Glycemic control for   <7.0% adults with diabetes   08/06/2020 04:33 AM 7.4 (H) 4.8 - 5.6 % Final    Comment:    (NOTE) Pre diabetes:          5.7%-6.4%  Diabetes:              >6.4%  Glycemic control for   <7.0% adults with diabetes     CBG: Recent Labs  Lab 01/26/23 0042 01/26/23 0734  GLUCAP 203* 144*    Review of Systems:   Unreliable historian, otherwise as per HPI  Past Medical History:  He,  has a past medical history of Anteroseptal myocardial infarction [facility]), Carotid artery occlusion, CHF (congestive heart failure) (HCC), Chronic kidney disease, COPD (chronic obstructive pulmonary disease) (HCC), Coronary atherosclerosis of unspecified type of vessel, native or graft, Diverticulosis, ED (erectile dysfunction), Foley catheter in place, Glaucoma, Hematuria, History of 2019 novel coronavirus disease (COVID-19) (11/2018), Hydronephrosis, bilateral, Hydroureter, Hypercholesteremia,  Hypertensive renal disease, Hypogonadism male, Hypokalemia, Obesity hypoventilation syndrome (HCC), Obesity, unspecified, OSA (obstructive sleep apnea), Peripheral vascular disease (HCC), Permanent atrial fibrillation (HCC), Phimosis, Pneumonia, Rhabdomyolysis, Stroke (HCC), Tremor, Type II or unspecified type diabetes mellitus without mention of complication, not stated as uncontrolled, Unspecified essential hypertension, Urinary incontinence, Urinary retention, and Vestibulopathy.   Surgical History:   Past Surgical History:  Procedure Laterality Date   APPENDECTOMY  1985  CARDIAC CATHETERIZATION     CHOLECYSTECTOMY  06/2000   CIRCUMCISION N/A 05/28/2020   Procedure: CIRCUMCISION ADULT;  Surgeon: Marcine Matar, MD;  Location: [facility] ORS;  Service: Urology;  Laterality: N/A;   CORONARY ANGIOPLASTY  08/14/1995   stent placement to LAD    DORSAL SLIT N/A 05/28/2020   Procedure: DORSAL SLIT;  Surgeon: Marcine Matar, MD;  Location: [facility] ORS;  Service: Urology;  Laterality: N/A;  45 MINS   ENDOVENOUS ABLATION SAPHENOUS VEIN W/ LASER Left 02/21/2018   endovenous laser ablation L GSV by Fabienne Bruns MD    ENTEROSCOPY N/A 11/28/2022   Procedure: ENTEROSCOPY;  Surgeon: Benancio Deeds, MD;  Location: [facility] ENDOSCOPY;  Service: Gastroenterology;  Laterality: N/A;   HOT HEMOSTASIS N/A 11/28/2022   Procedure: HOT HEMOSTASIS (ARGON PLASMA COAGULATION/BICAP);  Surgeon: Benancio Deeds, MD;  Location: [facility] ENDOSCOPY;  Service: Gastroenterology;  Laterality: N/A;   INGUINAL HERNIA REPAIR Right 1985   KNEE ARTHROSCOPY Left 1991   LUMBAR FUSION     NOSE SURGERY  1970s   Per Dr. Sharyn Lull [facility] in pt chart   TRANSCAROTID ARTERY REVASCULARIZATION  Right 06/12/2020   Procedure: RIGHT TRANSCAROTID ARTERY REVASCULARIZATION;  Surgeon: Nada Libman, MD;  Location: [facility] OR;  Service: Vascular;  Laterality: Right;     Social History:   reports that he has never smoked. He has never been exposed to  tobacco smoke. He has never used smokeless tobacco. He reports that he does not drink alcohol and does not use drugs.   Family History:  His family history includes Breast cancer in his maternal aunt; COPD in his mother; Colon cancer in his maternal aunt; Diabetes in his father, paternal grandmother, and sister; Glaucoma in an other family member; Heart disease in his brother, brother, father, and mother; Ovarian cancer in his daughter.   Allergies Allergies  Allergen Reactions   Adhesive [Tape] Rash     Home Medications  Prior to Admission medications   Medication Sig Start Date End Date Taking? Authorizing Provider  acetaminophen (TYLENOL) 500 MG tablet Take 500 mg by mouth every 8 (eight) hours as needed (pain).   Yes [provider]  albuterol (VENTOLIN HFA) 108 (90 Base) MCG/ACT inhaler Inhale 2 puffs into the lungs 2 (two) times daily as needed for wheezing or shortness of breath. 07/03/21  Yes Marrion Coy, MD  alum & mag hydroxide-simeth (MAALOX/MYLANTA) 200-200-20 MG/5 SUSP Apply 10 mLs topically every 6 (six) hours as needed (GERD symptoms).   Yes [provider]  apixaban (ELIQUIS) 5 MG TABS tablet Take 1 tablet (5 mg total) by mouth 2 (two) times daily. 12/03/22  Yes Ghimire, Werner Lean, MD  atorvastatin (LIPITOR) 40 MG tablet Take 40 mg by mouth at bedtime.   Yes [provider]  atropine 1 % ophthalmic solution Place 1 drop into the right eye daily. 06/09/22  Yes [provider]  chlorhexidine (PERIDEX) 0.12 % solution Use as directed 15 mLs in the mouth or throat 2 (two) times daily.   Yes [provider]  Cholecalciferol (VITAMIN D-3) 25 MCG (1000 UT) CAPS Take 2,000 Units by mouth daily.   Yes [provider]  dorzolamide-timolol (COSOPT) 22.3-6.8 MG/ML ophthalmic solution Place 1 drop into both eyes 2 (two) times daily.   Yes [provider]  Dulaglutide (TRULICITY) 3 MG/0.5ML SOPN Inject 3 mg into the skin every  Monday.   Yes [provider]  ferrous gluconate (FERGON) 324 MG tablet Take 324 mg by mouth at  bedtime.   Yes [provider]  furosemide (LASIX) 20 MG tablet Take 20 mg by mouth daily. 11/17/22  Yes [provider]  gabapentin (NEURONTIN) 100 MG capsule Take 2 capsules (200 mg total) by mouth at bedtime as needed. Patient taking differently: Take 200 mg by mouth at bedtime. 08/07/20  Yes Leeroy Bock, MD  ipratropium (ATROVENT) 0.02 % nebulizer solution Take 0.5 mg by nebulization every 6 (six) hours as needed for wheezing or shortness of breath.   Yes [provider]  latanoprost (XALATAN) 0.005 % ophthalmic solution Place 1 drop into the left eye at bedtime. 01/03/17  Yes [provider]  losartan (COZAAR) 50 MG tablet Take 50 mg by mouth in the morning and at bedtime.   Yes [provider]  magnesium oxide (MAG-OX) 400 MG tablet Take 400 mg by mouth daily.   Yes [provider]  Menthol, Topical Analgesic, (BIOFREEZE) 4 % GEL Apply 1 application  topically in the morning and at bedtime. To neck and shoulder   Yes [provider]  metoprolol succinate (TOPROL XL) 25 MG 24 hr tablet Take 0.5 tablets (12.5 mg total) by mouth daily. 12/02/22 12/02/23 Yes Ghimire, Werner Lean, MD  nitroGLYCERIN (NITROSTAT) 0.4 MG SL tablet Place 0.4 mg under the tongue every 5 (five) minutes x 3 doses as needed for chest pain (and CALL PROVIDER IF NO RELIEF AFTER THE 3RD DOSE).   Yes [provider]  pantoprazole (PROTONIX) 40 MG tablet Take 1 tablet (40 mg total) by mouth 2 (two) times daily. 12/02/22  Yes Ghimire, Werner Lean, MD  Polyethyl Glyc-Propyl Glyc PF (SYSTANE PRESERVATIVE FREE) 0.4-0.3 % SOLN Place 1 drop into both eyes 3 (three) times daily.   Yes [provider]  polyethylene glycol (MIRALAX / GLYCOLAX) 17 g packet Take 17 g by mouth daily.   Yes [provider]  prednisoLONE acetate (PRED FORTE) 1 % ophthalmic  suspension Place 1 drop into the right eye 2 (two) times daily. 06/09/22  Yes [provider]  sennosides-docusate sodium (SENOKOT-S) 8.6-50 MG tablet Take 2 tablets by mouth 2 (two) times daily.   Yes [provider]  sertraline (ZOLOFT) 25 MG tablet Take 25 mg by mouth daily. 11/14/22  Yes [provider]  SPIRIVA RESPIMAT 2.5 MCG/ACT AERS Inhale 1 each into the lungs daily. 11/22/22  Yes [provider]  tamsulosin (FLOMAX) 0.4 MG CAPS capsule Take 1 capsule (0.4 mg total) by mouth daily after supper. 12/25/18  Yes Hall, Carole N, DO  VASCEPA 1 g capsule Take 1 g by mouth every evening.   Yes [provider]  vitamin B-12 (CYANOCOBALAMIN) 1000 MCG tablet Take 1,000 mcg by mouth daily.   Yes [provider]  Zinc Oxide (DESITIN) 40 % PSTE Apply 1 Application topically See admin instructions. Apply topically to sacrum and buttocks every shift and as needed for skin protection.   Yes [provider]  OXYGEN Inhale 2 L into the lungs at bedtime.    [provider]     Critical care time: 35 mins     Modena Slater Internal Medicine Resident PGY-2 [phone]

## 2023-01-27 DIAGNOSIS — A4151 Sepsis due to Escherichia coli [E. coli]: Secondary | ICD-10-CM | POA: Diagnosis not present

## 2023-01-27 DIAGNOSIS — L8990 Pressure ulcer of unspecified site, unspecified stage: Secondary | ICD-10-CM | POA: Insufficient documentation

## 2023-01-27 LAB — URINE CULTURE: Culture: 1000 — AB

## 2023-01-27 LAB — GLUCOSE, CAPILLARY
Glucose-Capillary: 112 mg/dL — ABNORMAL HIGH (ref 70–99)
Glucose-Capillary: 125 mg/dL — ABNORMAL HIGH (ref 70–99)
Glucose-Capillary: 117 mg/dL — ABNORMAL HIGH (ref 70–99)
Glucose-Capillary: 131 mg/dL — ABNORMAL HIGH (ref 70–99)

## 2023-01-27 LAB — BASIC METABOLIC PANEL
Anion gap: 7 (ref 5–15)
BUN: 22 mg/dL (ref 8–23)
CO2: 23 mmol/L (ref 22–32)
Calcium: 8.2 mg/dL — ABNORMAL LOW (ref 8.9–10.3)
Chloride: 107 mmol/L (ref 98–111)
Creatinine, Ser: 1.48 mg/dL — ABNORMAL HIGH (ref 0.61–1.24)
GFR, Estimated: 46 mL/min — ABNORMAL LOW (ref >60)
Glucose, Bld: 130 mg/dL — ABNORMAL HIGH (ref 70–99)
Potassium: 3.8 mmol/L (ref 3.5–5.1)
Sodium: 137 mmol/L (ref 135–145)

## 2023-01-27 LAB — CULTURE, BLOOD (ROUTINE X 2): Special Requests: ADEQUATE

## 2023-01-27 LAB — CBC
HCT: 33.4 % — ABNORMAL LOW (ref 39.0–52.0)
Hemoglobin: 9.8 g/dL — ABNORMAL LOW (ref 13.0–17.0)
MCH: 25.1 pg — ABNORMAL LOW (ref 26.0–34.0)
MCHC: 29.3 g/dL — ABNORMAL LOW (ref 30.0–36.0)
MCV: 85.6 fL (ref 80.0–100.0)
Platelets: 198 10*3/uL (ref 150–400)
RBC: 3.9 MIL/uL — ABNORMAL LOW (ref 4.22–5.81)
RDW: 19.4 % — ABNORMAL HIGH (ref 11.5–15.5)
WBC: 9.7 10*3/uL (ref 4.0–10.5)
nRBC: 0 % (ref 0.0–0.2)

## 2023-01-27 LAB — MAGNESIUM: Magnesium: 2.2 mg/dL (ref 1.7–2.4)

## 2023-01-27 LAB — HEMOGLOBIN A1C
Hgb A1c MFr Bld: 5 % (ref 4.8–5.6)
Mean Plasma Glucose: 97 mg/dL

## 2023-01-27 MED ORDER — ACETAMINOPHEN 325 MG PO TABS
ORAL_TABLET | ORAL | Status: AC
  Filled 2023-01-27: qty 2

## 2023-01-27 MED ORDER — APIXABAN 5 MG PO TABS
5.0000 mg | ORAL_TABLET | Freq: Two times a day (BID) | ORAL | Status: DC
  Administered 2023-01-27 – 2023-01-30 (×6): 5 mg via ORAL
  Filled 2023-01-27 (×6): qty 1

## 2023-01-27 MED ORDER — ACETAMINOPHEN 325 MG PO TABS
650.0000 mg | ORAL_TABLET | Freq: Four times a day (QID) | ORAL | Status: DC | PRN
  Administered 2023-01-27: 650 mg via ORAL

## 2023-01-27 NOTE — Plan of Care (Signed)
  Problem: Clinical Measurements: Goal: Ability to maintain clinical measurements within normal limits will improve Outcome: Progressing Goal: Respiratory complications will improve Outcome: Progressing   Problem: Nutrition: Goal: Adequate nutrition will be maintained Outcome: Progressing   Problem: Coping: Goal: Level of anxiety will decrease Outcome: Progressing   Problem: Pain Managment: Goal: General experience of comfort will improve Outcome: Progressing   Problem: Safety: Goal: Ability to remain free from injury will improve Outcome: Progressing   Problem: Skin Integrity: Goal: Risk for impaired skin integrity will decrease Outcome: Progressing   Problem: Coping: Goal: Ability to adjust to condition or change in health will improve Outcome: Progressing   Problem: Metabolic: Goal: Ability to maintain appropriate glucose levels will improve Outcome: Progressing   Problem: Nutritional: Goal: Progress toward achieving an optimal weight will improve Outcome: Progressing   Problem: Skin Integrity: Goal: Risk for impaired skin integrity will decrease Outcome: Progressing   Problem: Tissue Perfusion: Goal: Adequacy of tissue perfusion will improve Outcome: Progressing

## 2023-01-27 NOTE — Hospital Course (Signed)
This is a 87 year old male with past medical history of hypertension, HFpEF, chronic anemia, type 2 diabetes who presents the emergency department with concerns of abdominal pain and nausea, vomiting, diarrhea. Patient had to have concern for urosepsis with septic shock due to ecoli bacteremia and admitted for further evaluation and management. Patient also had some complications with concerns of hematuria and bright red blood per rectum. Hematuria has cleared ad patient was thought to have hemorrhoidal  bleed which has resolved as well with preparation H. He has been restarted on his Ac with eliquis for his Afib. He has since been weaned off levophed and is stable to transfer to med tele. He is to complete 7 day course of ceftiaxone for Ecoli bacteremia likely from urinary source. He is on day 2 today. He has been weaned down to his home 3L of oxygen as well.

## 2023-01-27 NOTE — Progress Notes (Signed)
NAME:  Reginald [name], MRN:  [id number], DOB:  03-Mar-1936, LOS: 2 ADMISSION DATE:  01/25/2023, CONSULTATION DATE:  01/25/23  REFERRING MD:  EDP, CHIEF COMPLAINT:  hypotension   History of Present Illness:  87 yo snf pt with pmh CHF COPD htn cva, t2dm presented 2/2 hematuria on doac (eliquis) 2/2 afib. Upon presentation pt was confused, hypotensive that was initially responsive to IVF. His hematuria improved however he was febrile and despite volume remained hypotensive ultimately req pressor support to maintain BP.  Pt is relatively poor historian with unreliable history he does endorse n/v/d and Llquadrant pain that has been ongoing for approximately a week. He denies any other issues, stating he has no fever, has been eating well, no dysuria. He denies cp, sob, states he utilizes 3L oxygen at home and currently is on 5L with sats 97%. Much of history is from edp as well chart review.   Upon my evaluation pt appears fluid overloaded with escalated oxygen requirement bibasilar rales and pitting edema to thighs. He is on 7 mcg of levo at this time. Alert and in no acute distress.   Pertinent  Medical History  HFpEF COPD on 3L [place] at baseline HTN CVA R eye blindness T2dm Paroxysmal atrial fibrillation  Significant Hospital Events: Including procedures, antibiotic start and stop dates in addition to other pertinent events   Admitted to ICU 7/17  Interim History / Subjective:   Overnight: No acute events  Patient evaluated at bed side this morning. He notes that he is doing well. He states he has not had a bowel movement since yesterday.   Objective   Blood pressure 104/73, pulse 96, temperature 98.9 F (37.2 C), temperature source Oral, resp. rate 16, weight 122 kg, SpO2 96%. Off pressors and off all sedation.         Intake/Output Summary (Last 24 hours) at 01/27/2023 0933 Last data filed at 01/27/2023 0700 Gross per 24 hour  Intake 410.28 ml  Output 1250 ml  Net -839.72 ml    Filed Weights   01/26/23 0500  Weight: 122 kg    Examination: General: Patient resting comfortably in bed in no acute distress Eyes: Tracking appropriately  Head: Normocephalic, atraumatic  Cardio: Regular rate and rhythm, no murmurs, rubs or gallops. 2+ pulses to bilateral upper and lower extremities  Pulmonary: Diminished breath sounds bilaterally Abdomen: Soft, nontender, normoactive bowel sounds Neuro: Alert and orientated x3.  Extremities: 2+ pitting edema bilaterally  Labs reviewed Labs this morning still pending.  Glucose 112-144  CT abdomen pelvis with contrast 1. Small pleural effusion on the left and trace pleural effusion on the right with atelectasis or infiltrate at the lung bases bilaterally. 2. Mild diffuse bladder wall thickening, suggesting cystitis. 3. Nonobstructive right renal calculus. 4. Coronary artery calcifications and aortic atherosclerosis.  ECHO: LVEF 55-60%. The left ventricle is has normal function with some concentric left ventricle hypertrophy.   Resolved Hospital Problem list   Hematuria  Assessment & Plan:   This is a 87 year old male with past medical history of hypertension, HFpEF, chronic anemia, type 2 diabetes who presents the emergency department with concerns of abdominal pain and nausea, vomiting, diarrhea.  Patient had to have concern for urosepsis with septic shock and admitted for further evaluation and management.  #Septic shock secondary to urosepsis, resolved  #E. coli bacteremia #Leukocytosis #Hematuria Patient no longer having hematuria. Looked at canister this morning and urine is normal color. Blood cultures are growing Ecoli. Will continue to follow  cultures. Patient has been weaned off pressors at this time and blood pressures are stable this morning.  -Monitor blood pressure closely  -Will continue ceftriaxone day 2 of 7  -Monitor white count -Monitor fever curve -Follow cultures  -Can transfer to floor today    #Acute encephalopathy, resolved  This was likely multifactorial in the setting of sepsis and shock. Patient is alert and oriented x3 this morning and is doing well. No acute concerns this morning.  -Delirium precautions   #Rectal bleeding, resolving  Patient has not had any further bowel movements and is not longer having complaints of rectal bleeding. He has not had any further episodes per nursing. He is hemodynamically stable. Preparation H has been helping the patient.  Likely was hemorrhoidal bleed.  -H/H stable -can resume anticoagulation  -Continue Protonix 40 mg twice daily -Preparation H  #Acute on chronic hypoxic respiratory failure, resolved This was likely in the setting of sepsis and patient has been weaned down to his home 3L. He is satting well and does not have any concerns of respiratory distress.  -weaned down to 3L  -given blood pressure is softer, hold diuresis   #HFpEF #Ischemic cardiomyopathy Patient back to home 3L. Patient is volume up and could benefit from diuresis, but given softer pressures, would not tolerated at this time. ECHO did not show reduced ejection fraction.  -Monitor strict I's and O's -continue home lipitor 40 mg daily   #Paroxysmal atrial fibrillation Patient is currently in A-fib rhythm.  Rate controlled. Patient is to be on Eliquis and metoprolol succinate at hiome. Can resume Eliquis today. Patient is rate controlled.  -Hold metoprolol succinate in the setting of hypotension -Monitor on telemetry -Resume eliquis   #Type 2 diabetes mellitus Repeat A1c 5.0. Well controlled  -Continue SSI  #Electrolyte derangements Labs from this morning are pending.  -Replete as needed   #Anxiety/depression -Continue home Zoloft 25 mg daily  #History of CVA  Has right eye blindness in the setting of the previous stroke. No acute concerns at this moment from this aspect.  -Continue to monitor   Best Practice (right click and "Reselect all  SmartList Selections" daily)   Diet/type: Regular consistency (see orders) DVT prophylaxis: prophylactic heparin  GI prophylaxis: N/A Lines: N/A Foley:  N/A Code Status:  DNR Last date of multidisciplinary goals of care discussion [pending contact with family. ]  Labs   CBC: Recent Labs  Lab 01/25/23 1824 01/25/23 1851 01/26/23 0402 01/26/23 0728 01/26/23 1244 01/26/23 1930  WBC 5.3  --  28.2* 21.9*  --   --   NEUTROABS 4.8  --   --  18.9*  --   --   HGB 11.3* 13.3 9.0* 9.4* 9.8* 9.3*  HCT 40.2 39.0 31.5* 32.8* 34.5* 32.7*  MCV 88.0  --  85.1 85.4  --   --   PLT 257  --  248 222  --   --     Basic Metabolic Panel: Recent Labs  Lab 01/25/23 1824 01/25/23 1851 01/26/23 0402 01/26/23 1930  NA 142 144 140 137  K 3.3* 3.4* 3.2* 3.7  CL 106  --  106 106  CO2 27  --  23 24  GLUCOSE 129*  --  212* 140*  BUN 18  --  22 23  CREATININE 1.35*  --  1.40* 1.57*  CALCIUM 8.7*  --  8.2* 8.1*  MG  --   --  1.5* 2.0  PHOS  --   --  2.9  --  GFR: Estimated Creatinine Clearance: 46.6 mL/min (A) (by C-G formula based on SCr of 1.57 mg/dL (H)). Recent Labs  Lab 01/25/23 1824 01/25/23 1830 01/25/23 2204 01/25/23 2218 01/26/23 0023 01/26/23 0402 01/26/23 0728 01/26/23 1243  WBC 5.3  --   --   --   --  28.2* 21.9*  --   LATICACIDVEN  --    < > 2.2* 2.1* 2.4*  --   --  2.2*   < > = values in this interval not displayed.    Liver Function Tests: Recent Labs  Lab 01/25/23 1824  AST 25  ALT 21  ALKPHOS 95  BILITOT 0.6  PROT 5.9*  ALBUMIN 2.8*   Recent Labs  Lab 01/25/23 1841  LIPASE 34   No results for input(s): "AMMONIA" in the last 168 hours.  ABG    Component Value Date/Time   HCO3 26.9 01/25/2023 1851   TCO2 29 01/25/2023 1851   ACIDBASEDEF 2.0 01/25/2023 1851   O2SAT 89 01/25/2023 1851     Coagulation Profile: Recent Labs  Lab 01/25/23 1824  INR 1.4*    Cardiac Enzymes: No results for input(s): "CKTOTAL", "CKMB", "CKMBINDEX", "TROPONINI" in  the last 168 hours.  HbA1C: Hgb A1c MFr Bld  Date/Time Value Ref Range Status  01/26/2023 04:02 AM 5.0 4.8 - 5.6 % Final    Comment:    (NOTE)         Prediabetes: 5.7 - 6.4         Diabetes: >6.4         Glycemic control for adults with diabetes: <7.0   04/09/2021 06:45 AM 6.0 (H) 4.8 - 5.6 % Final    Comment:    (NOTE) Pre diabetes:          5.7%-6.4%  Diabetes:              >6.4%  Glycemic control for   <7.0% adults with diabetes     CBG: Recent Labs  Lab 01/26/23 0734 01/26/23 1129 01/26/23 1656 01/26/23 2109 01/27/23 0733  GLUCAP 144* 138* 130* 119* 112*    Review of Systems:   Unreliable historian, otherwise as per HPI  Past Medical History:  He,  has a past medical history of Anteroseptal myocardial infarction [facility]), Carotid artery occlusion, CHF (congestive heart failure) (HCC), Chronic kidney disease, COPD (chronic obstructive pulmonary disease) (HCC), Coronary atherosclerosis of unspecified type of vessel, native or graft, Diverticulosis, ED (erectile dysfunction), Foley catheter in place, Glaucoma, Hematuria, History of 2019 novel coronavirus disease (COVID-19) (11/2018), Hydronephrosis, bilateral, Hydroureter, Hypercholesteremia, Hypertensive renal disease, Hypogonadism male, Hypokalemia, Obesity hypoventilation syndrome (HCC), Obesity, unspecified, OSA (obstructive sleep apnea), Peripheral vascular disease (HCC), Permanent atrial fibrillation (HCC), Phimosis, Pneumonia, Rhabdomyolysis, Stroke (HCC), Tremor, Type II or unspecified type diabetes mellitus without mention of complication, not stated as uncontrolled, Unspecified essential hypertension, Urinary incontinence, Urinary retention, and Vestibulopathy.   Surgical History:   Past Surgical History:  Procedure Laterality Date   APPENDECTOMY  1985   CARDIAC CATHETERIZATION     CHOLECYSTECTOMY  06/2000   CIRCUMCISION N/A 05/28/2020   Procedure: CIRCUMCISION ADULT;  Surgeon: Marcine Matar, MD;   Location: [facility] ORS;  Service: Urology;  Laterality: N/A;   CORONARY ANGIOPLASTY  08/14/1995   stent placement to LAD    DORSAL SLIT N/A 05/28/2020   Procedure: DORSAL SLIT;  Surgeon: Marcine Matar, MD;  Location: [facility] ORS;  Service: Urology;  Laterality: N/A;  45 MINS   ENDOVENOUS ABLATION SAPHENOUS VEIN W/ LASER Left 02/21/2018  endovenous laser ablation L GSV by Fabienne Bruns MD    ENTEROSCOPY N/A 11/28/2022   Procedure: ENTEROSCOPY;  Surgeon: Benancio Deeds, MD;  Location: [facility] ENDOSCOPY;  Service: Gastroenterology;  Laterality: N/A;   HOT HEMOSTASIS N/A 11/28/2022   Procedure: HOT HEMOSTASIS (ARGON PLASMA COAGULATION/BICAP);  Surgeon: Benancio Deeds, MD;  Location: [facility]COPY;  Service: Gastroenterology;  Laterality: N/A;   INGUINAL HERNIA REPAIR Right 1985   KNEE ARTHROSCOPY Left 1991   LUMBAR FUSION     NOSE SURGERY  1970s   Per Dr. Sharyn Lull [facility] in pt chart   TRANSCAROTID ARTERY REVASCULARIZATION  Right 06/12/2020   Procedure: RIGHT TRANSCAROTID ARTERY REVASCULARIZATION;  Surgeon: Nada Libman, MD;  Location: [facility] OR;  Service: Vascular;  Laterality: Right;     Social History:   reports that he has never smoked. He has never been exposed to tobacco smoke. He has never used smokeless tobacco. He reports that he does not drink alcohol and does not use drugs.   Family History:  His family history includes Breast cancer in his maternal aunt; COPD in his mother; Colon cancer in his maternal aunt; Diabetes in his father, paternal grandmother, and sister; Glaucoma in an other family member; Heart disease in his brother, brother, father, and mother; Ovarian cancer in his daughter.   Allergies Allergies  Allergen Reactions   Adhesive [Tape] Rash     Home Medications  Prior to Admission medications   Medication Sig Start Date End Date Taking? Authorizing Provider  acetaminophen (TYLENOL) 500 MG tablet Take 500 mg by mouth every 8 (eight) hours as needed (pain).   Yes  [provider]  albuterol (VENTOLIN HFA) 108 (90 Base) MCG/ACT inhaler Inhale 2 puffs into the lungs 2 (two) times daily as needed for wheezing or shortness of breath. 07/03/21  Yes Marrion Coy, MD  alum & mag hydroxide-simeth (MAALOX/MYLANTA) 200-200-20 MG/5 SUSP Apply 10 mLs topically every 6 (six) hours as needed (GERD symptoms).   Yes [provider]  apixaban (ELIQUIS) 5 MG TABS tablet Take 1 tablet (5 mg total) by mouth 2 (two) times daily. 12/03/22  Yes Ghimire, Werner Lean, MD  atorvastatin (LIPITOR) 40 MG tablet Take 40 mg by mouth at bedtime.   Yes [provider]  atropine 1 % ophthalmic solution Place 1 drop into the right eye daily. 06/09/22  Yes [provider]  chlorhexidine (PERIDEX) 0.12 % solution Use as directed 15 mLs in the mouth or throat 2 (two) times daily.   Yes [provider]  Cholecalciferol (VITAMIN D-3) 25 MCG (1000 UT) CAPS Take 2,000 Units by mouth daily.   Yes [provider]  dorzolamide-timolol (COSOPT) 22.3-6.8 MG/ML ophthalmic solution Place 1 drop into both eyes 2 (two) times daily.   Yes [provider]  Dulaglutide (TRULICITY) 3 MG/0.5ML SOPN Inject 3 mg into the skin every Monday.   Yes [provider]  ferrous gluconate (FERGON) 324 MG tablet Take 324 mg by mouth at bedtime.   Yes [provider]  furosemide (LASIX) 20 MG tablet Take 20 mg by mouth daily. 11/17/22  Yes [provider]  gabapentin (NEURONTIN) 100 MG capsule Take 2 capsules (200 mg total) by mouth at bedtime as needed. Patient taking differently: Take 200 mg by mouth at bedtime. 08/07/20  Yes Leeroy Bock, MD  ipratropium (ATROVENT) 0.02 % nebulizer solution Take 0.5 mg by nebulization every 6 (six) hours as needed for wheezing or shortness of breath.   Yes [provider]  latanoprost (XALATAN) 0.005 % ophthalmic solution Place 1 drop into the left eye at bedtime. 01/03/17  Yes [provider]  losartan (COZAAR) 50 MG tablet Take 50 mg by mouth in the morning and at bedtime.   Yes [provider]  magnesium oxide (MAG-OX) 400 MG tablet Take 400 mg by mouth daily.   Yes [provider]  Menthol, Topical Analgesic, (BIOFREEZE) 4 % GEL Apply 1 application  topically in the morning and at bedtime. To neck and shoulder   Yes [provider]  metoprolol succinate (TOPROL XL) 25 MG 24 hr tablet Take 0.5 tablets (12.5 mg total) by mouth daily. 12/02/22 12/02/23 Yes Ghimire, Werner Lean, MD  nitroGLYCERIN (NITROSTAT) 0.4 MG SL tablet Place 0.4 mg under the tongue every 5 (five) minutes x 3 doses as needed for chest pain (and CALL PROVIDER IF NO RELIEF AFTER THE 3RD DOSE).   Yes [provider]  pantoprazole (PROTONIX) 40 MG tablet Take 1 tablet (40 mg total) by mouth 2 (two) times daily. 12/02/22  Yes Ghimire, Werner Lean, MD  Polyethyl Glyc-Propyl Glyc PF (SYSTANE PRESERVATIVE FREE) 0.4-0.3 % SOLN Place 1 drop into both eyes 3 (three) times daily.   Yes [provider]  polyethylene glycol (MIRALAX / GLYCOLAX) 17 g packet Take 17 g by mouth daily.   Yes [provider]  prednisoLONE acetate (PRED FORTE) 1 % ophthalmic suspension Place 1 drop into the right eye 2 (two) times daily. 06/09/22  Yes [provider]  sennosides-docusate sodium (SENOKOT-S) 8.6-50 MG tablet Take 2 tablets by mouth 2 (two) times daily.   Yes [provider]  sertraline (ZOLOFT) 25 MG tablet Take 25 mg by mouth daily. 11/14/22  Yes [provider]  SPIRIVA RESPIMAT 2.5 MCG/ACT AERS Inhale 1 each into the lungs daily. 11/22/22  Yes [provider]  tamsulosin (FLOMAX) 0.4 MG CAPS capsule Take 1 capsule (0.4 mg total) by mouth daily after supper. 12/25/18  Yes Hall, Carole N, DO  VASCEPA 1 g capsule Take 1 g by mouth every evening.   Yes [provider]  vitamin B-12 (CYANOCOBALAMIN) 1000 MCG tablet Take 1,000 mcg by mouth  daily.   Yes [provider]  Zinc Oxide (DESITIN) 40 % PSTE Apply 1 Application topically See admin instructions. Apply topically to sacrum and buttocks every shift and as needed for skin protection.   Yes [provider]  OXYGEN Inhale 2 L into the lungs at bedtime.    [provider]     Critical care time: 35 mins     Modena Slater Internal Medicine Resident PGY-2 [phone]

## 2023-01-28 DIAGNOSIS — R6521 Severe sepsis with septic shock: Secondary | ICD-10-CM | POA: Diagnosis not present

## 2023-01-28 DIAGNOSIS — A4151 Sepsis due to Escherichia coli [E. coli]: Secondary | ICD-10-CM | POA: Diagnosis not present

## 2023-01-28 LAB — CBC WITH DIFFERENTIAL/PLATELET
Abs Immature Granulocytes: 0.02 10*3/uL (ref 0.00–0.07)
Basophils Absolute: 0 10*3/uL (ref 0.0–0.1)
Basophils Relative: 1 %
Eosinophils Absolute: 0.4 10*3/uL (ref 0.0–0.5)
Eosinophils Relative: 6 %
HCT: 28.8 % — ABNORMAL LOW (ref 39.0–52.0)
Hemoglobin: 8.4 g/dL — ABNORMAL LOW (ref 13.0–17.0)
Immature Granulocytes: 0 %
Lymphocytes Relative: 16 %
Lymphs Abs: 1.1 10*3/uL (ref 0.7–4.0)
MCH: 24.1 pg — ABNORMAL LOW (ref 26.0–34.0)
MCHC: 29.2 g/dL — ABNORMAL LOW (ref 30.0–36.0)
MCV: 82.8 fL (ref 80.0–100.0)
Monocytes Absolute: 0.8 10*3/uL (ref 0.1–1.0)
Monocytes Relative: 11 %
Neutro Abs: 4.5 10*3/uL (ref 1.7–7.7)
Neutrophils Relative %: 66 %
Platelets: 185 10*3/uL (ref 150–400)
RBC: 3.48 MIL/uL — ABNORMAL LOW (ref 4.22–5.81)
RDW: 19 % — ABNORMAL HIGH (ref 11.5–15.5)
WBC: 6.8 10*3/uL (ref 4.0–10.5)
nRBC: 0 % (ref 0.0–0.2)

## 2023-01-28 LAB — CULTURE, BLOOD (ROUTINE X 2): Special Requests: ADEQUATE

## 2023-01-28 LAB — BASIC METABOLIC PANEL
Anion gap: 8 (ref 5–15)
BUN: 26 mg/dL — ABNORMAL HIGH (ref 8–23)
CO2: 24 mmol/L (ref 22–32)
Calcium: 7.9 mg/dL — ABNORMAL LOW (ref 8.9–10.3)
Chloride: 104 mmol/L (ref 98–111)
Creatinine, Ser: 1.52 mg/dL — ABNORMAL HIGH (ref 0.61–1.24)
GFR, Estimated: 45 mL/min — ABNORMAL LOW (ref >60)
Glucose, Bld: 189 mg/dL — ABNORMAL HIGH (ref 70–99)
Potassium: 3.4 mmol/L — ABNORMAL LOW (ref 3.5–5.1)
Sodium: 136 mmol/L (ref 135–145)

## 2023-01-28 LAB — URINE CULTURE

## 2023-01-28 LAB — LACTIC ACID, PLASMA: Lactic Acid, Venous: 1.4 mmol/L (ref 0.5–1.9)

## 2023-01-28 LAB — MAGNESIUM: Magnesium: 1.9 mg/dL (ref 1.7–2.4)

## 2023-01-28 LAB — GLUCOSE, CAPILLARY
Glucose-Capillary: 140 mg/dL — ABNORMAL HIGH (ref 70–99)
Glucose-Capillary: 139 mg/dL — ABNORMAL HIGH (ref 70–99)
Glucose-Capillary: 118 mg/dL — ABNORMAL HIGH (ref 70–99)
Glucose-Capillary: 141 mg/dL — ABNORMAL HIGH (ref 70–99)

## 2023-01-28 LAB — PROCALCITONIN: Procalcitonin: 26 ng/mL

## 2023-01-28 LAB — PHOSPHORUS: Phosphorus: 2.5 mg/dL (ref 2.5–4.6)

## 2023-01-28 MED ORDER — POTASSIUM CHLORIDE 20 MEQ PO PACK
40.0000 meq | PACK | Freq: Once | ORAL | Status: AC
  Administered 2023-01-28: 40 meq via ORAL
  Filled 2023-01-28: qty 2

## 2023-01-28 MED ORDER — MAGNESIUM SULFATE 2 GM/50ML IV SOLN
2.0000 g | Freq: Once | INTRAVENOUS | Status: AC
  Administered 2023-01-28: 2 g via INTRAVENOUS
  Filled 2023-01-28: qty 50

## 2023-01-28 MED ORDER — ATORVASTATIN CALCIUM 40 MG PO TABS
40.0000 mg | ORAL_TABLET | Freq: Every day | ORAL | Status: DC
  Administered 2023-01-28 – 2023-01-29 (×2): 40 mg via ORAL
  Filled 2023-01-28 (×2): qty 1

## 2023-01-28 MED ORDER — GABAPENTIN 100 MG PO CAPS
200.0000 mg | ORAL_CAPSULE | Freq: Every day | ORAL | Status: DC
  Administered 2023-01-28 – 2023-01-29 (×2): 200 mg via ORAL
  Filled 2023-01-28 (×2): qty 2

## 2023-01-28 MED ORDER — POLYVINYL ALCOHOL 1.4 % OP SOLN
1.0000 [drp] | Freq: Three times a day (TID) | OPHTHALMIC | Status: DC
  Administered 2023-01-28 – 2023-01-30 (×7): 1 [drp] via OPHTHALMIC
  Filled 2023-01-28: qty 15

## 2023-01-28 MED ORDER — PREDNISOLONE ACETATE 1 % OP SUSP
1.0000 [drp] | Freq: Two times a day (BID) | OPHTHALMIC | Status: DC
  Administered 2023-01-28 – 2023-01-30 (×5): 1 [drp] via OPHTHALMIC
  Filled 2023-01-28: qty 5

## 2023-01-28 MED ORDER — ATROPINE SULFATE 1 % OP SOLN
1.0000 [drp] | Freq: Every day | OPHTHALMIC | Status: DC
  Administered 2023-01-28 – 2023-01-30 (×3): 1 [drp] via OPHTHALMIC
  Filled 2023-01-28: qty 2

## 2023-01-28 MED ORDER — DORZOLAMIDE HCL-TIMOLOL MAL 2-0.5 % OP SOLN
1.0000 [drp] | Freq: Two times a day (BID) | OPHTHALMIC | Status: DC
  Administered 2023-01-28 – 2023-01-30 (×5): 1 [drp] via OPHTHALMIC
  Filled 2023-01-28: qty 10

## 2023-01-28 MED ORDER — LATANOPROST 0.005 % OP SOLN
1.0000 [drp] | Freq: Every day | OPHTHALMIC | Status: DC
  Administered 2023-01-28 – 2023-01-29 (×2): 1 [drp] via OPHTHALMIC
  Filled 2023-01-28: qty 2.5

## 2023-01-28 NOTE — Hospital Course (Addendum)
87yo male with h/o COPD on 3L home O2, HFpEF, HTN, CVA, DM, and afib who presented from SNF on 7/17 with septic shock from E coli UTI.  He was started on pressors but these were weaned off and he was transferred to the floor on 7/19.  He remains on ceftriaxone with planned dc date of 7/24.

## 2023-01-28 NOTE — Progress Notes (Signed)
eLink Physician-Brief Progress Note Patient Name: Reginald [name] DOB: 10-12-1935 MRN: [id number]   Date of Service  01/28/2023  HPI/Events of Note  K+ 3.4, Mg++ 1.9, Cr 1.52  eICU Interventions  Mg++ SO4  2 gm iv x 1, KCL 40 meq po x 1 ordered.        [name] 01/28/2023, 2:45 AM

## 2023-01-28 NOTE — TOC Progression Note (Signed)
Transition of Care [facility]) - Progression Note    Patient Details  Name: Reginald [name] MRN: [id number] Date of Birth: Apr 22, 1936  Transition of [facility] CM/SW Contact  Donnalee Curry, LCSWA Phone Number: 01/28/2023, 2:47 PM  Clinical Narrative:     Per MD, pt may need course of IV abx at d/c. SW informed MD it should not be an issue at SNF as long as it is not a high cost abx. Pt currently on ceftriaxone, which is not high cost. SW confirmed with Lawerance Cruel [facility] [phone])  Expected Discharge Plan: Long Term Nursing Home Barriers to Discharge: Continued Medical Work up  Expected Discharge Plan and Services     Post Acute Care Choice: Skilled Nursing Facility Living arrangements for the past 2 months: Skilled Nursing Facility                                       Social Determinants of Health (SDOH) Interventions SDOH Screenings   Food Insecurity: No Food Insecurity (12/01/2022)  Housing: Low Risk  (12/01/2022)  Transportation Needs: No Transportation Needs (12/01/2022)  Utilities: Not At Risk (12/01/2022)  Depression (PHQ2-9): Low Risk  (07/20/2020)  Tobacco Use: Low Risk  (01/17/2023)    Readmission Risk Interventions     No data to display

## 2023-01-28 NOTE — Plan of Care (Signed)
  Problem: Nutrition: Goal: Adequate nutrition will be maintained Outcome: Progressing   Problem: Elimination: Goal: Will not experience complications related to urinary retention Outcome: Progressing   Problem: Pain Managment: Goal: General experience of comfort will improve Outcome: Progressing   Problem: Safety: Goal: Ability to remain free from injury will improve Outcome: Progressing   

## 2023-01-28 NOTE — Progress Notes (Addendum)
Progress Note   Patient: Reginald [name]:[id number] DOB: 08-Nov-1935 DOA: 01/25/2023     3 DOS: the patient was seen and examined on 01/28/2023   Brief hospital course: 87yo male with h/o COPD on 3L home O2, HFpEF, HTN, CVA, DM, and afib who presented from SNF on 7/17 with septic shock from E coli UTI.  He was started on pressors but these were weaned off and he was transferred to the floor on 7/19.  He remains on ceftriaxone with planned dc date of 7/24.  Assessment and Plan:  Septic shock secondary to UTI with E. coli bacteremia Patient was in shock on presentation, treated with IVF and pressors with subsequent resolution of shock physiology Developed hematuria but this has also resolved Urine and blood cultures with pansensitive E coli He is on day 3/7 of ceftriaxone; I have messaged TOC team to see if he can complete treatment at [facility] or whether he will need to stay inpatient throughout   HFpEF Has LE edema Does not appear to be grossly overhydrated from admission Will monitor He is chronically bedbound so unlikely to mobilize edema well    Paroxysmal atrial fibrillation Patient is rate controlled with Toprol XL, but this was held in the setting of hypotension Eliquis has been resumed   Type 2 diabetes mellitus Repeat A1c 5.0. Well controlled  Hold Trulicity Continue SSI Continue gabapentin qhs  Acute on chronic hypoxic respiratory failure, resolved Thought to be related to sepsis Back on home 3L O2 No current respiratory symptoms Continue prn Albuterol   Anxiety/depression Continue home Zoloft 25 mg daily   History of CVA  Has right eye blindness in the setting of the previous stroke.  No acute concerns at this moment from this aspect  HTN Resume Toprol XL, losartan when BP will tolerate  HLD Continue atorvastatin  Glaucoma Continue Cosopt, latanoprost  Stage 3a CKD Appears to be stable at this time Attempt to avoid nephrotoxic  medications Recheck BMP in AM   Pressure ulcer Pressure Injury 01/26/23 Arm Left;Lower;Posterior Stage 2 -  Partial thickness loss of dermis presenting as a shallow open injury with a red, pink wound bed without slough. 2 bloody spots that look relatively healed (Active)  01/26/23 0030  Location: Arm  Location Orientation: Left;Lower;Posterior  Staging: Stage 2 -  Partial thickness loss of dermis presenting as a shallow open injury with a red, pink wound bed without slough.  Wound Description (Comments): 2 bloody spots that look relatively healed  Present on Admission: Yes   Obesity Body mass index is 37.51 kg/m.Marland Kitchen  This issue is associated with increased morbidity and mortality  Acute encephalopathy, resolved  This was likely multifactorial in the setting of sepsis and shock Very pleasant and conversant this AM   Rectal bleeding, resolving  Likely was hemorrhoidal bleed.  Hgb 8.4 today, down from 9-9.8  Eliquis resumed Continue Protonix 40 mg twice daily Preparation H Recheck CBC in AM  DNR Code status was documented at the time of admission   Consultants: PCCM PT OT [facility] team  Procedures: Echocardiogram 7/18  Antibiotics: Cefepime 7/17-18 Flagyl 7/17-18 Vancomycin 7/17-18 Ceftriaxone 7/18-present   Subjective: He is feeling better today.  He is pleasant and conversant without difficulty.  Clearly feeling better.   Objective: Vitals:   01/28/23 0805 01/28/23 0846  BP:  (!) 141/65  Pulse:  68  Resp:  18  Temp:  97.8 F (36.6 C)  SpO2: 99% 99%    Intake/Output Summary (Last 24  hours) at 01/28/2023 1512 Last data filed at 01/28/2023 1230 Gross per 24 hour  Intake 851.67 ml  Output 1025 ml  Net -173.33 ml   Filed Weights   01/26/23 0500  Weight: 122 kg   Vitals:   01/28/23 0805 01/28/23 0846  BP:  (!) 141/65  Pulse:  68  Resp:  18  Temp:  97.8 F (36.6 C)  SpO2: 99% 99%    Exam:  General:  Appears calm and comfortable and is in NAD Eyes:    EOMI, normal lids, iris ENT:  grossly normal hearing, lips & tongue, mmm Neck:  no LAD, masses or thyromegaly Cardiovascular:  RRR, no m/r/g. 3+ LE edema.  Respiratory:   CTA bilaterally with no wheezes/rales/rhonchi.  Normal respiratory effort. Abdomen:  soft, NT, ND Skin:  no rash or induration seen on limited exam Musculoskeletal:  grossly normal tone BUE/BLE, good ROM, no bony abnormality Psychiatric:  grossly normal mood and affect, speech fluent and appropriate, AOx3 Neurologic:  CN 2-12 grossly intact, moves all extremities in coordinated fashion  Data Reviewed: I have reviewed the patient's lab results since admission.  Pertinent labs for today include:  K+ 3.4 Glucose 189 BUN 26/Creatinine 1.52/GFR 46 - at/near baseline Lactate 1.4 Procalcitonin 26 WBC 6.8 Hgb 8.4   Family Communication: None present  Disposition: Status is: Inpatient Remains inpatient appropriate because: ongoing abx therapy  Planned Discharge Destination: Skilled nursing facility    Time spent: 50 minutes  Author: Jonah Blue, MD 01/28/2023 3:12 PM  For on call review [email or web address].

## 2023-01-28 NOTE — Progress Notes (Signed)
Received pt as transfer via bed, A&Ox4, forgetful, on 4 L [place]. Denies pain, SOB, dizziness. VSS. Two nurse skin assessment completed. Pt placed on telemetry, afib rate controlled on the monitor. Provider notified.

## 2023-01-29 DIAGNOSIS — R6521 Severe sepsis with septic shock: Secondary | ICD-10-CM | POA: Diagnosis not present

## 2023-01-29 DIAGNOSIS — A4151 Sepsis due to Escherichia coli [E. coli]: Secondary | ICD-10-CM | POA: Diagnosis not present

## 2023-01-29 LAB — CBC WITH DIFFERENTIAL/PLATELET
Abs Immature Granulocytes: 0.02 10*3/uL (ref 0.00–0.07)
Basophils Absolute: 0 10*3/uL (ref 0.0–0.1)
Basophils Relative: 0 %
Eosinophils Absolute: 0.4 10*3/uL (ref 0.0–0.5)
Eosinophils Relative: 5 %
HCT: 30.9 % — ABNORMAL LOW (ref 39.0–52.0)
Hemoglobin: 8.9 g/dL — ABNORMAL LOW (ref 13.0–17.0)
Immature Granulocytes: 0 %
Lymphocytes Relative: 20 %
Lymphs Abs: 1.4 10*3/uL (ref 0.7–4.0)
MCH: 23.9 pg — ABNORMAL LOW (ref 26.0–34.0)
MCHC: 28.8 g/dL — ABNORMAL LOW (ref 30.0–36.0)
MCV: 82.8 fL (ref 80.0–100.0)
Monocytes Absolute: 0.9 10*3/uL (ref 0.1–1.0)
Monocytes Relative: 13 %
Neutro Abs: 4.5 10*3/uL (ref 1.7–7.7)
Neutrophils Relative %: 62 %
Platelets: 226 10*3/uL (ref 150–400)
RBC: 3.73 MIL/uL — ABNORMAL LOW (ref 4.22–5.81)
RDW: 18.7 % — ABNORMAL HIGH (ref 11.5–15.5)
WBC: 7.3 10*3/uL (ref 4.0–10.5)
nRBC: 0 % (ref 0.0–0.2)

## 2023-01-29 LAB — GLUCOSE, CAPILLARY
Glucose-Capillary: 105 mg/dL — ABNORMAL HIGH (ref 70–99)
Glucose-Capillary: 140 mg/dL — ABNORMAL HIGH (ref 70–99)
Glucose-Capillary: 112 mg/dL — ABNORMAL HIGH (ref 70–99)
Glucose-Capillary: 119 mg/dL — ABNORMAL HIGH (ref 70–99)

## 2023-01-29 LAB — BASIC METABOLIC PANEL
Anion gap: 8 (ref 5–15)
BUN: 25 mg/dL — ABNORMAL HIGH (ref 8–23)
CO2: 26 mmol/L (ref 22–32)
Calcium: 8.3 mg/dL — ABNORMAL LOW (ref 8.9–10.3)
Chloride: 105 mmol/L (ref 98–111)
Creatinine, Ser: 1.42 mg/dL — ABNORMAL HIGH (ref 0.61–1.24)
GFR, Estimated: 49 mL/min — ABNORMAL LOW (ref >60)
Glucose, Bld: 113 mg/dL — ABNORMAL HIGH (ref 70–99)
Potassium: 3.9 mmol/L (ref 3.5–5.1)
Sodium: 139 mmol/L (ref 135–145)

## 2023-01-29 MED ORDER — CEPHALEXIN 500 MG PO CAPS
1000.0000 mg | ORAL_CAPSULE | Freq: Four times a day (QID) | ORAL | Status: DC
  Administered 2023-01-30: 1000 mg via ORAL
  Filled 2023-01-29: qty 2

## 2023-01-29 NOTE — Progress Notes (Signed)
Progress Note   Patient: Reginald [name] WFU:[id number] DOB: 12/18/1935 DOA: 01/25/2023     4 DOS: the patient was seen and examined on 01/29/2023   Brief hospital course: 87yo male with h/o COPD on 3L home O2, HFpEF, HTN, CVA, DM, and afib who presented from SNF on 7/17 with septic shock from E coli UTI.  He was started on pressors but these were weaned off and he was transferred to the floor on 7/19.  He remains on ceftriaxone with planned dc date of 7/24.  Pharmacy has reviewed and now recommends high-dose keflex 1 g q6h to complete 7 days of therapy.  He has marked LE edema.  Assessment and Plan:  Septic shock secondary to UTI with E. coli bacteremia Patient was in shock on presentation, treated with IVF and pressors with subsequent resolution of shock physiology Developed hematuria but this has also resolved Urine and blood cultures with pansensitive E coli He is on day 3/7 of ceftriaxone Pharmacy has reviewed and recommends high-dose keflex 1 g q6h to complete 7 days of therapy    HFpEF Has LE edema Does not appear to be grossly overhydrated from admission He is chronically bedbound so unlikely to mobilize edema well  Will add Ted hose   Paroxysmal atrial fibrillation Patient is rate controlled with Toprol XL, but this was held in the setting of hypotension Eliquis has been resumed   Type 2 diabetes mellitus Repeat A1c 5.0. 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  No acute concerns at this moment from this aspect   HTN Resume Toprol XL, losartan when BP will tolerate   HLD Continue atorvastatin   Glaucoma Continue Cosopt, latanoprost    Stage 3a CKD Appears to be stable at this time Attempt to avoid nephrotoxic medications Recheck BMP in AM    Pressure ulcer Pressure Injury 01/26/23 Arm Left;Lower;Posterior Stage 2 -  Partial thickness loss of dermis presenting as a shallow open injury with a red, pink wound bed without slough. 2 bloody spots that look relatively healed (Active)  01/26/23 0030  Location: Arm  Location Orientation: Left;Lower;Posterior  Staging: Stage 2 -  Partial thickness loss of dermis presenting as a shallow open injury with a red, pink wound bed without slough.  Wound Description (Comments): 2 bloody spots that look relatively healed  Present on Admission: Yes    Obesity Body mass index is 37.51 kg/m.Marland Kitchen  This issue is associated with increased morbidity and mortality   Acute encephalopathy, resolved  This was likely multifactorial in the setting of sepsis and shock Appears to have resolved   Rectal bleeding, resolving  Likely was hemorrhoidal bleed.  Hgb 8.9 today, stable Eliquis resumed Continue Protonix 40 mg twice daily Preparation H Recheck CBC in AM   DNR Code status was documented at the time of admission     Consultants: PCCM PT OT [facility] team   Procedures: Echocardiogram 7/18   Antibiotics: Cefepime 7/17-18 Flagyl 7/17-18 Vancomycin 7/17-18 Ceftriaxone 7/18-present   Subjective: No new concerns today - nurse was concerned about LE edema but patient has no noticed.  He agrees with probable dc back to [facility].  Physical Exam: Vitals:  01/29/23 0714 01/29/23 0834 01/29/23 0835 01/29/23 0955  BP:  (!) 120/54  137/61  Pulse:  66 66 71  Resp:  20 20 19   Temp:  97.7 F (36.5 C)  98.3 F (36.8 C)  TempSrc:  Oral  Oral  SpO2:  98% 98% 98%  Weight: 122 kg      Exam:   General:  Appears calm and comfortable and is in NAD Eyes:   EOMI, normal lids, iris ENT:  grossly normal hearing, lips & tongue, mmm Neck:  no LAD, masses or  thyromegaly Cardiovascular:  RRR, no m/r/g. 3-4+ LE edema.  Respiratory:   CTA bilaterally with no wheezes/rales/rhonchi.  Normal respiratory effort. Abdomen:  soft, NT, ND Skin:  no rash or induration seen on limited exam Musculoskeletal:  chronic L-sided weakness that he reports is chronic Psychiatric:  grossly normal mood and affect, speech fluent and appropriate, AOx3 Neurologic:  CN 2-12 grossly intact, moves all extremities in coordinated fashion   Data Reviewed: I have reviewed the patient's lab results since admission.  Pertinent labs for today include:  Glucose 113 BUN 25/Creatinine 1.42/GFR 49 - improved WBC 7.3 Hgb 8.9  Family Communication: None present; I spoke with his son today by telephone  Disposition: Status is: Inpatient Remains inpatient appropriate because: still on IV antibiotics  Planned Discharge Destination: Skilled nursing facility    Time spent: 35 minutes  Author: Jonah Blue, MD 01/29/2023 4:40 PM  For on call review [email or web address].

## 2023-01-29 NOTE — Evaluation (Signed)
Occupational Therapy Evaluation Patient Details Name: Reginald [name] MRN: [id number] DOB: 04/19/1936 Today's Date: 01/29/2023   History of Present Illness 87 yo male presents to ED from [facility] with confusion, and hypotension. Found to be septic with E.coli. PMH: CAD s/p PCI to LAD, afib on Eliquis, Found to be in shock COPD on 3L at baseline CVA with L-sided residual weakness, CAD and PVD, DM II, HTN, HLD, CKD III, OSA, and obesity.   Clinical Impression   Pt admitted for above, PTA pt transferred via Stedy to complete bADLs needing assist for LB ADLs. Pt currently needing Max A to Total +2 for bed mobility and exhibits poor sitting balance, occasionally needing back support for him to maintain upright posture while using bilat Ues. Pt would benefit from continued acute skilled OT services to address deficits and help transition to next level of care. Patient would benefit from post acute skilled rehab facility with <3 hours of therapy and 24/7 support      Recommendations for follow up therapy are one component of a multi-disciplinary discharge planning process, led by the attending physician.  Recommendations may be updated based on patient status, additional functional criteria and insurance authorization.   Assistance Recommended at Discharge Frequent or constant Supervision/Assistance  Patient can return home with the following Two people to help with walking and/or transfers;Two people to help with bathing/dressing/bathroom;Assistance with cooking/housework;Assist for transportation    Functional Status Assessment  Patient has had a recent decline in their functional status and demonstrates the ability to make significant improvements in function in a reasonable and predictable amount of time.  Equipment Recommendations  None recommended by OT (defer to next level of care)    Recommendations for Other Services       Precautions / Restrictions Precautions Precautions:  Fall Restrictions Weight Bearing Restrictions: No      Mobility Bed Mobility Overal bed mobility: Needs Assistance Bed Mobility: Rolling, Sidelying to Sit, Sit to Supine Rolling: Max assist, +2 for physical assistance Sidelying to sit: Total assist, +2 for physical assistance, HOB elevated   Sit to supine: Total assist, +2 for physical assistance   General bed mobility comments: max A x2 for movement of LE pt able to use bedrail to assist in pulling to EoB, requires total A for bringing LE to floor and maxA for pushing trunk to upright, total Ax2 for returning to supine    Transfers                   General transfer comment: deferred today but reports use of Stedy a few days ago, next session attempt to coming to standing      Balance Overall balance assessment: Needs assistance Sitting-balance support: Feet supported, Bilateral upper extremity supported Sitting balance-Leahy Scale: Poor Sitting balance - Comments: short bouts of unsupported sitting with firm grasp on bed rail on L and foot board with R, other wise requires posterior support                                   ADL either performed or assessed with clinical judgement   ADL Overall ADL's : Needs assistance/impaired Eating/Feeding: Independent;Bed level   Grooming: Wash/dry hands;Sitting;Min guard;Set up   Upper Body Bathing: Sitting;Min guard;Set up   Lower Body Bathing: Maximal assistance;Bed level   Upper Body Dressing : Sitting;Min guard   Lower Body Dressing: Total assistance;Bed level  Toilet Transfer Details (indicate cue type and reason): Nt, stedy at baseline Toileting- Clothing Manipulation and Hygiene: Maximal assistance         General ADL Comments: OOB mobility deferred this OT session, may attempt stedy next session. Repositioned pt in bed at end of session for pressure relief from sacral wound     Vision   Additional Comments: Totally Blind in R eye, uses  head/scanning to locate things in room     Perception     Praxis      Pertinent Vitals/Pain Pain Assessment Pain Assessment: Faces Faces Pain Scale: Hurts a little bit Pain Location: LE with movement and dependent positioning Pain Descriptors / Indicators: Grimacing Pain Intervention(s): Monitored during session, Repositioned     Hand Dominance Right   Extremity/Trunk Assessment Upper Extremity Assessment Upper Extremity Assessment: Generalized weakness RUE Deficits / Details: ROM WFL LUE Deficits / Details: Decreased shoulder flexion, ~110* with light AAROM.   Lower Extremity Assessment Lower Extremity Assessment: LLE deficits/detail;RLE deficits/detail RLE Deficits / Details: ROM limited by habitus and edema, independent movement at hips, knees and ankles, Strength grossly 4/5 RLE Sensation: decreased light touch RLE Coordination: decreased fine motor LLE Deficits / Details: residual L sided weakness, ROM limited by body habitus and increased edema, independent movement at hip, knee and ankle, strength grossly 2+/5 LLE Sensation: decreased light touch LLE Coordination: decreased fine motor;decreased gross motor   Cervical / Trunk Assessment Cervical / Trunk Assessment: Other exceptions Cervical / Trunk Exceptions: increased body habitus   Communication Communication Communication: HOH   Cognition Arousal/Alertness: Awake/alert Behavior During Therapy: WFL for tasks assessed/performed Overall Cognitive Status: Within Functional Limits for tasks assessed                                       General Comments  Bilateral LE erythema and edema, from calf to foot, shiny appearance to feet due to edema, Pt on 3L O2 via [place] which is his baseline, VSS    Exercises General Exercises - Lower Extremity Straight Leg Raises: AROM, 10 reps, Both Hip Flexion/Marching: AROM, 10 reps, Both, Seated   Shoulder Instructions      Home Living Family/patient expects to  be discharged to:: Skilled nursing facility                                 Additional Comments: [name] x 4 years per pt      Prior Functioning/Environment Prior Level of Function : Needs assist       Physical Assist : Mobility (physical) Mobility (physical): Bed mobility;Transfers   Mobility Comments: able to use Stedy to wheelchair for mobilization through out facility ADLs Comments: able to use Stedy to transfer to shower chair for 2x week showers, independent in feeding, incontinent of stool and urine, uses briefs        OT Problem List: Impaired balance (sitting and/or standing)      OT Treatment/Interventions: Self-care/ADL training;Energy conservation;Therapeutic exercise;Therapeutic activities;Patient/family education;Balance training    OT Goals(Current goals can be found in the care plan section) Acute Rehab OT Goals Patient Stated Goal: To get back to SNF OT Goal Formulation: With patient Time For Goal Achievement: 02/12/23 Potential to Achieve Goals: Good ADL Goals Pt Will Perform Grooming: sitting;with supervision;with set-up Pt Will Transfer to Toilet: with min assist;with +2 assist (with [place] Dec  Stedy) Pt/caregiver will Perform Home Exercise Program: Increased strength;Both right and left upper extremity;With written HEP provided;With theraband;Independently Additional ADL Goal #1: Pt will demonstrate ability to independently maintain sitting balance during dynamic reaching tasks EOB in preparation for ind bedside ADLs.  OT Frequency: Min 1X/week    Co-evaluation PT/OT/SLP Co-Evaluation/Treatment: Yes Reason for Co-Treatment: Complexity of the patient's impairments (multi-system involvement);For patient/therapist safety PT goals addressed during session: Mobility/safety with mobility;Balance OT goals addressed during session: ADL's and self-care      AM-PAC OT "6 Clicks" Daily Activity     Outcome Measure Help from another person  eating meals?: None Help from another person taking care of personal grooming?: A Little Help from another person toileting, which includes using toliet, bedpan, or urinal?: A Lot Help from another person bathing (including washing, rinsing, drying)?: A Lot Help from another person to put on and taking off regular upper body clothing?: A Little Help from another person to put on and taking off regular lower body clothing?: Total 6 Click Score: 15   End of Session Equipment Utilized During Treatment: Oxygen Nurse Communication: Mobility status  Activity Tolerance: Patient tolerated treatment well Patient left: in bed;with call bell/phone within reach  OT Visit Diagnosis: Unsteadiness on feet (R26.81)                Time: 1610-9604 OT Time Calculation (min): 36 min Charges:  OT General Charges $OT Visit: 1 Visit OT Evaluation $OT Eval Moderate Complexity: 1 Mod  01/29/2023  AB, OTR/L  Acute Rehabilitation Services  Office: [phone]   Tristan Schroeder 01/29/2023, 11:52 AM

## 2023-01-29 NOTE — Evaluation (Signed)
Physical Therapy Evaluation Patient Details Name: Reginald [name] MRN: [id number] DOB: 1935-07-30 Today's Date: 01/29/2023  History of Present Illness  87 yo male presents to ED from [facility] with confusion, and hypotension. Found to be septic with E.coli. PMH: CAD s/p PCI to LAD, afib on Eliquis, Found to be in shock COPD on 3L at baseline CVA with L-sided residual weakness, CAD and PVD, DM II, HTN, HLD, CKD III, OSA, and obesity.  Clinical Impression  PTA pt resident at [name] where he was participating in PT and able to transfer from bed to wheelchair/shower chair with Reginald [name] and propel his wheelchair independently through the facility. Pt is currently limited in safe mobility by increased LE edema and pain, in presence of generalized weakness overlying hx L sided weakness from CVA. Pt requiring total Ax2 for bed mobility today. Due to current decrease in mobility and pt reporting sacral pain and pressure PT contacted RN to order an air mattress for pressure relief. PT recommending pt return to [name] and resume PT there. PT and Mobility Specialist with see pt acutely to progress mobility.        Assistance Recommended at Discharge Frequent or constant Supervision/Assistance     Equipment Recommendations None recommended by PT     Functional Status Assessment Patient has had a recent decline in their functional status and demonstrates the ability to make significant improvements in function in a reasonable and predictable amount of time.     Precautions / Restrictions Precautions Precautions: Fall Restrictions Weight Bearing Restrictions: No      Mobility  Bed Mobility Overal bed mobility: Needs Assistance Bed Mobility: Rolling, Sidelying to Sit, Sit to Supine Rolling: Max assist, +2 for physical assistance Sidelying to sit: Total assist, +2 for physical assistance, HOB elevated   Sit to supine: Total assist, +2 for physical assistance   General bed mobility  comments: max A x2 for movement of LE pt able to use bedrail to assist in pulling to EoB, requires total A for bringing LE to floor and maxA for pushing trunk to upright, total Ax2 for returning to supine    Transfers                   General transfer comment: deferred today but reports use of Stedy a few days ago, next session attempt to coming to standing        Balance Overall balance assessment: Needs assistance Sitting-balance support: Feet supported, Bilateral upper extremity supported Sitting balance-Leahy Scale: Poor Sitting balance - Comments: short bouts of unsupported sitting with firm grasp on bed rail on L and foot board with R, other wise requires posterior support                                     Pertinent Vitals/Pain Pain Assessment Pain Assessment: Faces Faces Pain Scale: Hurts a little bit Pain Location: LE with movement and dependent positioning Pain Descriptors / Indicators: Grimacing Pain Intervention(s): Limited activity within patient's tolerance, Monitored during session, Repositioned    Home Living Family/patient expects to be discharged to:: Skilled nursing facility                   Additional Comments: [name] x 4 years per pt    Prior Function Prior Level of Function : Needs assist       Physical Assist : Mobility (physical) Mobility (  physical): Bed mobility;Transfers   Mobility Comments: able to use Stedy to wheelchair for mobilization through out facility ADLs Comments: able to use Stedy to transfer to shower chair for 2x week showers, independent in feeding, incontinent of stool and urine, uses briefs     Hand Dominance   Dominant Hand: Right    Extremity/Trunk Assessment   Upper Extremity Assessment Upper Extremity Assessment: Defer to OT evaluation    Lower Extremity Assessment Lower Extremity Assessment: LLE deficits/detail;RLE deficits/detail RLE Deficits / Details: ROM limited by  habitus and edema, independent movement at hips, knees and ankles, Strength grossly 4/5 RLE Sensation: decreased light touch RLE Coordination: decreased fine motor LLE Deficits / Details: residual L sided weakness, ROM limited by body habitus and increased edema, independent movement at hip, knee and ankle, strength grossly 2+/5 LLE Sensation: decreased light touch LLE Coordination: decreased fine motor;decreased gross motor       Communication   Communication: No difficulties  Cognition Arousal/Alertness: Awake/alert Behavior During Therapy: WFL for tasks assessed/performed Overall Cognitive Status: Within Functional Limits for tasks assessed                                          General Comments General comments (skin integrity, edema, etc.): Bilateral LE erythema and edema, from calf to foot, shiny appearance to feet due to edema, Pt on 3L O2 via [place] which is his baseline, VSS        Assessment/Plan    PT Assessment Patient needs continued PT services  PT Problem List Decreased strength;Decreased range of motion;Decreased activity tolerance;Decreased balance;Decreased mobility;Cardiopulmonary status limiting activity;Pain;Decreased skin integrity;Impaired sensation       PT Treatment Interventions DME instruction;Functional mobility training;Therapeutic activities;Therapeutic exercise;Balance training;Cognitive remediation;Patient/family education    PT Goals (Current goals can be found in the Care Plan section)  Acute Rehab PT Goals Patient Stated Goal: have some pizza PT Goal Formulation: With patient Time For Goal Achievement: 02/12/23 Potential to Achieve Goals: Fair    Frequency Min 3X/week        AM-PAC PT "6 Clicks" Mobility  Outcome Measure Help needed turning from your back to your side while in a flat bed without using bedrails?: Total Help needed moving from lying on your back to sitting on the side of a flat bed without using bedrails?:  Total Help needed moving to and from a bed to a chair (including a wheelchair)?: Total Help needed standing up from a chair using your arms (e.g., wheelchair or bedside chair)?: Total Help needed to walk in hospital room?: Total Help needed climbing 3-5 steps with a railing? : Total 6 Click Score: 6    End of Session Equipment Utilized During Treatment: Oxygen Activity Tolerance: Patient tolerated treatment well Patient left: in bed;with call bell/phone within reach;with bed alarm set Nurse Communication: Mobility status;Other (comment) (need for air mattress) PT Visit Diagnosis: Muscle weakness (generalized) (M62.81);Difficulty in walking, not elsewhere classified (R26.2);Pain;Hemiplegia and hemiparesis Hemiplegia - Right/Left: Left Hemiplegia - dominant/non-dominant: Non-dominant Hemiplegia - caused by: Cerebral infarction Pain - Right/Left:  (bilateral) Pain - part of body: Leg    Time: 1017-1050 PT Time Calculation (min) (ACUTE ONLY): 33 min   Charges:   PT Evaluation $PT Eval Moderate Complexity: 1 Mod   PT General Charges $$ ACUTE PT VISIT: 1 Visit         Midas Daughety B. Beverely Risen PT, DPT Acute Rehabilitation  Services Please use secure chat or  Call Office [phone]   Elon Alas [facility] 01/29/2023, 11:09 AM

## 2023-01-30 DIAGNOSIS — A4151 Sepsis due to Escherichia coli [E. coli]: Secondary | ICD-10-CM | POA: Diagnosis not present

## 2023-01-30 DIAGNOSIS — R6521 Severe sepsis with septic shock: Secondary | ICD-10-CM | POA: Diagnosis not present

## 2023-01-30 LAB — CBC WITH DIFFERENTIAL/PLATELET
Abs Immature Granulocytes: 0.03 10*3/uL (ref 0.00–0.07)
Basophils Absolute: 0.1 10*3/uL (ref 0.0–0.1)
Basophils Relative: 1 %
Eosinophils Absolute: 0.4 10*3/uL (ref 0.0–0.5)
Eosinophils Relative: 5 %
HCT: 31.8 % — ABNORMAL LOW (ref 39.0–52.0)
Hemoglobin: 9.1 g/dL — ABNORMAL LOW (ref 13.0–17.0)
Immature Granulocytes: 0 %
Lymphocytes Relative: 18 %
Lymphs Abs: 1.3 10*3/uL (ref 0.7–4.0)
MCH: 23.7 pg — ABNORMAL LOW (ref 26.0–34.0)
MCHC: 28.6 g/dL — ABNORMAL LOW (ref 30.0–36.0)
MCV: 82.8 fL (ref 80.0–100.0)
Monocytes Absolute: 0.8 10*3/uL (ref 0.1–1.0)
Monocytes Relative: 11 %
Neutro Abs: 4.7 10*3/uL (ref 1.7–7.7)
Neutrophils Relative %: 65 %
Platelets: 257 10*3/uL (ref 150–400)
RBC: 3.84 MIL/uL — ABNORMAL LOW (ref 4.22–5.81)
RDW: 18.6 % — ABNORMAL HIGH (ref 11.5–15.5)
WBC: 7.2 10*3/uL (ref 4.0–10.5)
nRBC: 0 % (ref 0.0–0.2)

## 2023-01-30 LAB — BASIC METABOLIC PANEL
Anion gap: 5 (ref 5–15)
BUN: 22 mg/dL (ref 8–23)
CO2: 26 mmol/L (ref 22–32)
Calcium: 8.7 mg/dL — ABNORMAL LOW (ref 8.9–10.3)
Chloride: 108 mmol/L (ref 98–111)
Creatinine, Ser: 1.4 mg/dL — ABNORMAL HIGH (ref 0.61–1.24)
GFR, Estimated: 50 mL/min — ABNORMAL LOW (ref >60)
Glucose, Bld: 130 mg/dL — ABNORMAL HIGH (ref 70–99)
Potassium: 3.6 mmol/L (ref 3.5–5.1)
Sodium: 139 mmol/L (ref 135–145)

## 2023-01-30 LAB — GLUCOSE, CAPILLARY
Glucose-Capillary: 116 mg/dL — ABNORMAL HIGH (ref 70–99)
Glucose-Capillary: 115 mg/dL — ABNORMAL HIGH (ref 70–99)
Glucose-Capillary: 116 mg/dL — ABNORMAL HIGH (ref 70–99)

## 2023-01-30 MED ORDER — GERHARDT'S BUTT CREAM: 1.0000 | TOPICAL_CREAM | Freq: Three times a day (TID) | CUTANEOUS | Status: DC

## 2023-01-30 MED ORDER — CEPHALEXIN 500 MG PO CAPS: 1000.0000 mg | ORAL_CAPSULE | Freq: Four times a day (QID) | ORAL | Status: DC

## 2023-01-30 MED ORDER — HYDROCORTISONE 1 % EX CREA: TOPICAL_CREAM | Freq: Two times a day (BID) | CUTANEOUS | Status: DC

## 2023-01-30 MED ORDER — METOPROLOL SUCCINATE ER 25 MG PO TB24
12.5000 mg | ORAL_TABLET | Freq: Every day | ORAL | Status: DC
  Administered 2023-01-30: 12.5 mg via ORAL
  Filled 2023-01-30: qty 1

## 2023-01-30 NOTE — Discharge Summary (Signed)
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  He was started on pressors but these were weaned off and he was transferred to the floor on 7/19.  He remains on ceftriaxone with planned dc date of 7/24.  Pharmacy has reviewed and now recommends high-dose keflex 1 g q6h to complete 7 days of therapy.  He has marked LE edema.  Assessment and Plan:  Septic shock secondary to UTI with E. coli bacteremia Patient was in shock on presentation, treated with IVF and pressors with subsequent resolution of shock physiology Developed hematuria but this has also resolved Urine and blood cultures with pansensitive E coli He is on day 3/7 of ceftriaxone Pharmacy has reviewed and recommends high-dose keflex 1 g q6h to complete 7 days of therapy    HFpEF Has LE edema Does not appear to be grossly overhydrated from admission He is chronically bedbound so unlikely to mobilize edema well  Added Ted hose - with some improvement   Paroxysmal atrial fibrillation Patient is rate controlled with  Toprol XL, but this was held in the setting of hypotension - ok to resume Eliquis has been resumed   Type 2 diabetes mellitus Repeat A1c 5.0. 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  No acute concerns at this moment from this aspect   HTN Will resume Toprol XL now Resume losartan when BP will tolerate   HLD Continue atorvastatin   Glaucoma Continue Cosopt, latanoprost   Stage 3a CKD Appears to be stable at this time Attempt to avoid nephrotoxic medications   Pressure ulcer Pressure Injury 01/26/23 Arm Left;Lower;Posterior Stage 2 -  Partial thickness loss of dermis presenting as a shallow open injury with a red, pink wound bed without slough. 2 bloody spots that look relatively healed (Active)  01/26/23 0030  Location: Arm  Location Orientation: Left;Lower;Posterior  Staging: Stage 2 -  Partial thickness loss of dermis presenting as a shallow open injury with a red, pink wound bed without slough.  Wound Description (Comments): 2 bloody spots that look relatively healed  Present on Admission: Yes    Obesity Body mass index is 37.51 kg/m.Marland Kitchen  This issue is associated with increased morbidity and mortality   Acute encephalopathy,  resolved  This was likely multifactorial in the setting of sepsis and shock Appears to have resolved   Rectal bleeding, resolving  Likely was hemorrhoidal bleed.  Hgb 8.9 today, stable Eliquis resumed Continue Protonix 40 mg twice daily Preparation H   DNR Code status was documented at the time of admission     Consultants: PCCM PT OT [facility] team   Procedures: Echocardiogram 7/18   Antibiotics: Cefepime  7/17-18 Flagyl 7/17-18 Vancomycin 7/17-18 Ceftriaxone 7/18-present     Pain control - [facility] Controlled Substance Reporting System database was reviewed. and patient was instructed, not to drive, operate heavy machinery, perform activities at heights, swimming or participation in water activities or provide baby-sitting services while on Pain, Sleep and Anxiety Medications; until their outpatient Physician has advised to do so again. Also recommended to not to take more than prescribed Pain, Sleep and Anxiety Medications.     Disposition: Skilled nursing facility Diet recommendation:  Carb modified diet DISCHARGE MEDICATION: Allergies as of 01/30/2023       Reactions   Adhesive [tape] Rash        Medication List     STOP taking these medications    losartan 50 MG tablet Commonly known as: COZAAR       TAKE these medications    acetaminophen 500 MG tablet Commonly known as: TYLENOL Take 500 mg by mouth every 8 (eight) hours as needed (pain).   albuterol 108 (90 Base) MCG/ACT inhaler Commonly known as: VENTOLIN HFA Inhale 2 puffs into the lungs 2 (two) times daily as needed for wheezing or shortness of breath.   alum & mag hydroxide-simeth 200-200-20 MG/5 Susp Commonly known as: MAALOX/MYLANTA Apply 10 mLs topically every 6 (six) hours as needed (GERD symptoms).   apixaban 5 MG Tabs tablet Commonly known as: ELIQUIS Take 1 tablet (5 mg total) by mouth 2 (two) times daily.   atorvastatin 40 MG tablet Commonly known as: LIPITOR Take 40 mg by mouth at bedtime.   atropine 1 % ophthalmic solution Place 1 drop into the right eye daily.   Biofreeze 4 % Gel Generic drug: Menthol (Topical Analgesic) Apply 1 application  topically in the morning and at bedtime. To neck and shoulder   cephALEXin 500 MG capsule Commonly known as: KEFLEX Take 2 capsules (1,000 mg total) by mouth every 6 (six) hours.   chlorhexidine 0.12 % solution Commonly known as:  PERIDEX Use as directed 15 mLs in the mouth or throat 2 (two) times daily.   cyanocobalamin 1000 MCG tablet Commonly known as: VITAMIN B12 Take 1,000 mcg by mouth daily.   Desitin 40 % Pste Generic drug: Zinc Oxide Apply 1 Application topically See admin instructions. Apply topically to sacrum and buttocks every shift and as needed for skin protection.   dorzolamide-timolol 2-0.5 % ophthalmic solution Commonly known as: COSOPT Place 1 drop into both eyes 2 (two) times daily.   ferrous gluconate 324 MG tablet Commonly known as: FERGON Take 324 mg by mouth at bedtime.   furosemide 20 MG tablet Commonly known as: LASIX Take 20 mg by mouth daily.   gabapentin 100 MG capsule Commonly known as: NEURONTIN Take 2 capsules (200 mg total) by mouth at bedtime as needed. What changed: when to take this   Gerhardt's butt cream Crea Apply 1 Application topically 3 (three) times daily.   hydrocortisone cream 1 % Apply topically 2 (two) times daily.   ipratropium 0.02 % nebulizer solution Commonly known as: ATROVENT Take 0.5 mg by  nebulization every 6 (six) hours as needed for wheezing or shortness of breath.   latanoprost 0.005 % ophthalmic solution Commonly known as: XALATAN Place 1 drop into the left eye at bedtime.   magnesium oxide 400 MG tablet Commonly known as: MAG-OX Take 400 mg by mouth daily.   metoprolol succinate 25 MG 24 hr tablet Commonly known as: Toprol XL Take 0.5 tablets (12.5 mg total) by mouth daily.   nitroGLYCERIN 0.4 MG SL tablet Commonly known as: NITROSTAT Place 0.4 mg under the tongue every 5 (five) minutes x 3 doses as needed for chest pain (and CALL PROVIDER IF NO RELIEF AFTER THE 3RD DOSE).   OXYGEN Inhale 2 L into the lungs at bedtime.   pantoprazole 40 MG tablet Commonly known as: PROTONIX Take 1 tablet (40 mg total) by mouth 2 (two) times daily.   polyethylene glycol 17 g packet Commonly known as: MIRALAX / GLYCOLAX Take 17 g by mouth  daily.   prednisoLONE acetate 1 % ophthalmic suspension Commonly known as: PRED FORTE Place 1 drop into the right eye 2 (two) times daily.   sennosides-docusate sodium 8.6-50 MG tablet Commonly known as: SENOKOT-S Take 2 tablets by mouth 2 (two) times daily.   sertraline 25 MG tablet Commonly known as: ZOLOFT Take 25 mg by mouth daily.   Spiriva Respimat 2.5 MCG/ACT Aers Generic drug: Tiotropium Bromide Monohydrate Inhale 1 each into the lungs daily.   Systane Preservative Free 0.4-0.3 % Soln Generic drug: Polyethyl Glyc-Propyl Glyc PF Place 1 drop into both eyes 3 (three) times daily.   tamsulosin 0.4 MG Caps capsule Commonly known as: FLOMAX Take 1 capsule (0.4 mg total) by mouth daily after supper.   Trulicity 3 MG/0.5ML Sopn Generic drug: Dulaglutide Inject 3 mg into the skin every Monday.   Vascepa 1 g capsule Generic drug: icosapent Ethyl Take 1 g by mouth every evening.   Vitamin D-3 25 MCG (1000 UT) Caps Take 2,000 Units by mouth daily.               Discharge Care Instructions  (From admission, onward)           Start     Ordered   01/30/23 0000  Discharge wound care:       Comments: Wound care for pressure injuries every shift   01/30/23 1123              30 Day Unplanned Readmission Risk Score    Flowsheet Row ED to Hosp-Admission (Current) from 01/25/2023 in [facility]  SURGICAL  30 Day Unplanned Readmission Risk Score (%) 29.56 Filed at 01/30/2023 0801       This score is the patient's risk of an unplanned readmission within 30 days of being discharged (0 -100%). The score is based on dignosis, age, lab data, medications, orders, and past utilization.   Low:  0-14.9   Medium: 15-21.9   High: 22-29.9   Extreme: 30 and above         Discharge Exam: Filed Weights   01/26/23 0500 01/29/23 0714  Weight: 122 kg 122 kg    Subjective: He is feeling much better and has no complaints.  He is in agreement  with plan for dc back to facility today.   Objective: Vitals:   01/30/23 0643 01/30/23 0818  BP: (!) 145/105   Pulse: 72 71  Resp: 18 18  Temp: (!) 97.5 F (36.4 C)   SpO2: 90% 91%  Intake/Output Summary (Last 24 hours) at 01/30/2023 1123 Last data filed at 01/30/2023 0650 Gross per 24 hour  Intake 240 ml  Output 1100 ml  Net -860 ml   Filed Weights   01/26/23 0500 01/29/23 0714  Weight: 122 kg 122 kg    Exam:  General:  Appears calm and comfortable and is in NAD, non home 3L [place] O2 Eyes:   EOMI, normal lids, iris ENT:  grossly normal hearing, lips & tongue, mmm Neck:  no LAD, masses or thyromegaly Cardiovascular:  RRR. 3+ LE edema that is mildly improved from yesterday.  Respiratory:   CTA bilaterally with no wheezes/rales/rhonchi.  Normal respiratory effort. Abdomen:  soft, NT, ND Skin:  no rash or induration seen on limited exam Musculoskeletal:  chronic L-sided weakness that he reports is unchanged from baseline Psychiatric:  blunted mood and affect, speech fluent and appropriate, AOx3 Neurologic:  CN 2-12 grossly intact   Data Reviewed: I have reviewed the patient's lab results since admission.  Pertinent labs for today include:   Glucose 130 BUN 22/Creatinine 1.4/GFR 50 WBC 7.2 Hgb 9.1 - stable  Family communication:  I spoke with his son, Loistine Chance, at the time of discharge    Condition at discharge: improving  The results of significant diagnostics from this hospitalization (including imaging, microbiology, ancillary and laboratory) are listed below for reference.   Imaging Studies: ECHOCARDIOGRAM LIMITED  Result Date: 01/26/2023    ECHOCARDIOGRAM LIMITED REPORT   Patient Name:   ARTIN MCEUEN [facility] Date of Exam: 01/26/2023 Medical Rec #:  [id number]        Height:       71.0 in Accession #:    [id number]       Weight:       269.0 lb Date of Birth:  1936-04-01        BSA:          2.391 m Patient Age:    86 years         BP:           86/65 mmHg Patient  Gender: M                HR:           61 bpm. Exam Location:  Inpatient Procedure: Limited Echo and Limited Color Doppler Indications:    CHF-Acute Diastolic I50.31  History:        Patient has prior history of Echocardiogram examinations, most                 recent 09/14/2022. CHF, CAD, Stroke, Signs/Symptoms:Hypotension                 and Chest Pain; Risk Factors:Hypertension, Sleep Apnea and                 Diabetes. CKD, stage 3.  Sonographer:    Lucendia Herrlich Referring Phys: [id number] JESSICA MARSHALL IMPRESSIONS  1. Left ventricular ejection fraction, by estimation, is 55 to 60%. The left ventricle has normal function. There is moderate concentric left ventricular hypertrophy. Left ventricular diastolic function could not be evaluated.  2. Left atrial size was moderately dilated.  3. Mild mitral valve regurgitation. Moderate mitral annular calcification.  4. Aortic valve sclerosis/calcification is present, without any evidence of aortic stenosis.  5. Aortic dilatation noted. There is borderline dilatation of the ascending aorta, measuring 38 mm.  6. The inferior vena cava is dilated in size with <50% respiratory variability, suggesting right atrial pressure  of 15 mmHg. FINDINGS  Left Ventricle: Left ventricular ejection fraction, by estimation, is 55 to 60%. The left ventricle has normal function. There is moderate concentric left ventricular hypertrophy. Left ventricular diastolic function could not be evaluated. Left ventricular diastolic function could not be evaluated due to atrial fibrillation. Left Atrium: Left atrial size was moderately dilated. Mitral Valve: Moderate mitral annular calcification. Mild mitral valve regurgitation. Tricuspid Valve: Tricuspid valve regurgitation is mild. Aortic Valve: Aortic valve sclerosis/calcification is present, without any evidence of aortic stenosis. Pulmonic Valve: The pulmonic valve was grossly normal. Pulmonic valve regurgitation is not visualized. Aorta: Aortic  dilatation noted. There is borderline dilatation of the ascending aorta, measuring 38 mm. Venous: The inferior vena cava is dilated in size with less than 50% respiratory variability, suggesting right atrial pressure of 15 mmHg. LEFT VENTRICLE PLAX 2D LVIDd:         4.45 cm LVIDs:         3.10 cm LV PW:         1.35 cm LV IVS:        1.35 cm LVOT diam:     2.20 cm LVOT Area:     3.80 cm  IVC IVC diam: 2.50 cm LEFT ATRIUM         Index LA diam:    4.95 cm 2.07 cm/m   AORTA Ao Root diam: 3.70 cm Ao Asc diam:  3.80 cm TRICUSPID VALVE TR Peak grad:   33.9 mmHg TR Vmax:        291.00 cm/s  SHUNTS Systemic Diam: 2.20 cm Arvilla Meres MD Electronically signed by Arvilla Meres MD Signature Date/Time: 01/26/2023/11:25:35 AM    Final    CT ABDOMEN PELVIS W CONTRAST  Result Date: 01/25/2023 CLINICAL DATA:  Sepsis, hematuria, and vomiting. EXAM: CT ABDOMEN AND PELVIS WITH CONTRAST TECHNIQUE: Multidetector CT imaging of the abdomen and pelvis was performed using the standard protocol following bolus administration of intravenous contrast. RADIATION DOSE REDUCTION: This exam was performed according to the departmental dose-optimization program which includes automated exposure control, adjustment of the mA and/or kV according to patient size and/or use of iterative reconstruction technique. CONTRAST:  75mL OMNIPAQUE IOHEXOL 350 MG/ML SOLN COMPARISON:  04/08/2021. FINDINGS: Lower chest: There is a small pleural effusion on the left and trace pleural effusion on the right with atelectasis or infiltrate at the lung bases. Coronary artery calcifications are noted and there is a small pericardial effusion. Hepatobiliary: No focal liver abnormality is seen. Status post cholecystectomy. No biliary dilatation. Pancreas: Unremarkable. No pancreatic ductal dilatation or surrounding inflammatory changes. Spleen: Normal in size without focal abnormality. Adrenals/Urinary Tract: No adrenal nodule or mass. The kidneys enhance  symmetrically. A nonobstructive calculus is noted in the lower pole of the right kidney. No ureteral calculus or obstructive uropathy. There is mild bladder wall thickening. Stomach/Bowel: Stomach is within normal limits. Appendix appears normal. No evidence of bowel wall thickening, distention, or inflammatory changes. No free air or pneumatosis. Vascular/Lymphatic: Aortic atherosclerosis. No enlarged abdominal or pelvic lymph nodes. Reproductive: Prostate is unremarkable. Other: No abdominopelvic ascites.  Anasarca is noted bilaterally. Musculoskeletal: Degenerative changes are present in the thoracolumbar spine. Lumbar spinal fusion hardware is noted at L3-L4. No acute osseous abnormality. IMPRESSION: 1. Small pleural effusion on the left and trace pleural effusion on the right with atelectasis or infiltrate at the lung bases bilaterally. 2. Mild diffuse bladder wall thickening, suggesting cystitis. 3. Nonobstructive right renal calculus. 4. Coronary artery calcifications and aortic atherosclerosis. Electronically Signed  By: Thornell Sartorius M.D.   On: 01/25/2023 22:05   DG Chest Port 1 View  Result Date: 01/25/2023 CLINICAL DATA:  Chest pain EXAM: PORTABLE CHEST 1 VIEW COMPARISON:  11/27/2022 FINDINGS: Unchanged cardiac and mediastinal contours. Low lung volumes, with a central vascular congestion. Redemonstrated interstitial prominence. No pleural effusion or pneumothorax. IMPRESSION: Low lung volumes with central vascular congestion and mild interstitial edema. Electronically Signed   By: Wiliam Ke M.D.   On: 01/25/2023 19:55    Microbiology: Results for orders placed or performed during the hospital encounter of 01/25/23  Culture, blood (Routine x 2)     Status: Abnormal   Collection Time: 01/25/23  5:58 PM   Specimen: BLOOD RIGHT FOREARM  Result Value Ref Range Status   Specimen Description BLOOD RIGHT FOREARM  Final   Special Requests   Final    BOTTLES DRAWN AEROBIC AND ANAEROBIC Blood  Culture adequate volume   Culture  Setup Time   Final    GRAM NEGATIVE RODS IN BOTH AEROBIC AND ANAEROBIC BOTTLES CRITICAL VALUE NOTED.  VALUE IS CONSISTENT WITH PREVIOUSLY REPORTED AND CALLED VALUE.    Culture (A)  Final    ESCHERICHIA COLI SUSCEPTIBILITIES PERFORMED ON PREVIOUS CULTURE WITHIN THE LAST 5 DAYS. Performed at [facility], [address] [place], [place] [address]    Report Status 01/28/2023 FINAL  Final  Culture, blood (Routine x 2)     Status: Abnormal   Collection Time: 01/25/23  6:29 PM   Specimen: BLOOD RIGHT HAND  Result Value Ref Range Status   Specimen Description BLOOD RIGHT HAND  Final   Special Requests   Final    BOTTLES DRAWN AEROBIC AND ANAEROBIC Blood Culture adequate volume   Culture  Setup Time   Final    GRAM NEGATIVE RODS IN BOTH AEROBIC AND ANAEROBIC BOTTLES Organism ID to follow CRITICAL RESULT CALLED TO, READ BACK BY AND VERIFIED WITH: T RUDISILL,PHARMD@0710  01/26/23 MK Performed at [facility], [address], [place], [place] [address]    Culture ESCHERICHIA COLI (A)  Final   Report Status 01/28/2023 FINAL  Final   Organism ID, Bacteria ESCHERICHIA COLI  Final      Susceptibility   Escherichia coli - MIC*    AMPICILLIN 8 SENSITIVE Sensitive     CEFEPIME <=0.12 SENSITIVE Sensitive     CEFTAZIDIME <=1 SENSITIVE Sensitive     CEFTRIAXONE <=0.25 SENSITIVE Sensitive     CIPROFLOXACIN <=0.25 SENSITIVE Sensitive     GENTAMICIN <=1 SENSITIVE Sensitive     IMIPENEM <=0.25 SENSITIVE Sensitive     TRIMETH/SULFA <=20 SENSITIVE Sensitive     AMPICILLIN/SULBACTAM 4 SENSITIVE Sensitive     PIP/TAZO <=4 SENSITIVE Sensitive     * ESCHERICHIA COLI  Blood Culture ID Panel (Reflexed)     Status: Abnormal   Collection Time: 01/25/23  6:29 PM  Result Value Ref Range Status   Enterococcus faecalis NOT DETECTED NOT DETECTED Final   Enterococcus Faecium NOT DETECTED NOT DETECTED Final   Listeria monocytogenes NOT DETECTED NOT DETECTED Final    Staphylococcus species NOT DETECTED NOT DETECTED Final   Staphylococcus aureus (BCID) NOT DETECTED NOT DETECTED Final   Staphylococcus epidermidis NOT DETECTED NOT DETECTED Final   Staphylococcus lugdunensis NOT DETECTED NOT DETECTED Final   Streptococcus species NOT DETECTED NOT DETECTED Final   Streptococcus agalactiae NOT DETECTED NOT DETECTED Final   Streptococcus pneumoniae NOT DETECTED NOT DETECTED Final   Streptococcus pyogenes NOT DETECTED NOT DETECTED Final  A.calcoaceticus-baumannii NOT DETECTED NOT DETECTED Final   Bacteroides fragilis NOT DETECTED NOT DETECTED Final   Enterobacterales DETECTED (A) NOT DETECTED Final    Comment: Enterobacterales represent a large order of gram negative bacteria, not a single organism. CRITICAL RESULT CALLED TO, READ BACK BY AND VERIFIED WITH: T RUDISILL,PHARMD@0710  01/26/23 MK    Enterobacter cloacae complex NOT DETECTED NOT DETECTED Final   Escherichia coli DETECTED (A) NOT DETECTED Final    Comment: CRITICAL RESULT CALLED TO, READ BACK BY AND VERIFIED WITH: T RUDISILL,PHARMD@0710  01/26/23 MK    Klebsiella aerogenes NOT DETECTED NOT DETECTED Final   Klebsiella oxytoca NOT DETECTED NOT DETECTED Final   Klebsiella pneumoniae NOT DETECTED NOT DETECTED Final   Proteus species NOT DETECTED NOT DETECTED Final   Salmonella species NOT DETECTED NOT DETECTED Final   Serratia marcescens NOT DETECTED NOT DETECTED Final   Haemophilus influenzae NOT DETECTED NOT DETECTED Final   Neisseria meningitidis NOT DETECTED NOT DETECTED Final   Pseudomonas aeruginosa NOT DETECTED NOT DETECTED Final   Stenotrophomonas maltophilia NOT DETECTED NOT DETECTED Final   Candida albicans NOT DETECTED NOT DETECTED Final   Candida auris NOT DETECTED NOT DETECTED Final   Candida glabrata NOT DETECTED NOT DETECTED Final   Candida krusei NOT DETECTED NOT DETECTED Final   Candida parapsilosis NOT DETECTED NOT DETECTED Final   Candida tropicalis NOT DETECTED NOT DETECTED  Final   Cryptococcus neoformans/gattii NOT DETECTED NOT DETECTED Final   CTX-M ESBL NOT DETECTED NOT DETECTED Final   Carbapenem resistance IMP NOT DETECTED NOT DETECTED Final   Carbapenem resistance KPC NOT DETECTED NOT DETECTED Final   Carbapenem resistance NDM NOT DETECTED NOT DETECTED Final   Carbapenem resist OXA 48 LIKE NOT DETECTED NOT DETECTED Final   Carbapenem resistance VIM NOT DETECTED NOT DETECTED Final    Comment: Performed at [facility], [address] [place], [place] [address]  MRSA Next Gen by PCR, Nasal     Status: Abnormal   Collection Time: 01/26/23 12:47 AM   Specimen: Nasal Mucosa; Nasal Swab  Result Value Ref Range Status   MRSA by PCR Next Gen DETECTED (A) NOT DETECTED Final    Comment: RESULT CALLED TO, READ BACK BY AND VERIFIED WITH: A LEWIS,RN@0215  01/26/23 MK (NOTE) The GeneXpert MRSA Assay (FDA approved for NASAL specimens only), is one component of a comprehensive MRSA colonization surveillance program. It is not intended to diagnose MRSA infection nor to guide or monitor treatment for MRSA infections. Test performance is not FDA approved in patients less than 67 years old. Performed at [facility], [address] [place], [place] [address]   Urine Culture (for pregnant, neutropenic or urologic patients or patients with an indwelling urinary catheter)     Status: Abnormal   Collection Time: 01/26/23  6:22 AM   Specimen: Urine, Catheterized  Result Value Ref Range Status   Specimen Description URINE, CATHETERIZED  Final   Special Requests   Final    NONE Performed at [facility], [address], [place], [place] [address]    Culture 1,000 COLONIES/mL ESCHERICHIA COLI (A)  Final   Report Status 01/28/2023 FINAL  Final   Organism ID, Bacteria ESCHERICHIA COLI (A)  Final      Susceptibility   Escherichia coli - MIC*    AMPICILLIN 8 SENSITIVE Sensitive     CEFAZOLIN <=4 SENSITIVE Sensitive     CEFEPIME <=0.12 SENSITIVE  Sensitive     CEFTRIAXONE <=0.25 SENSITIVE Sensitive  CIPROFLOXACIN <=0.25 SENSITIVE Sensitive     GENTAMICIN <=1 SENSITIVE Sensitive     IMIPENEM <=0.25 SENSITIVE Sensitive     NITROFURANTOIN <=16 SENSITIVE Sensitive     TRIMETH/SULFA <=20 SENSITIVE Sensitive     AMPICILLIN/SULBACTAM 4 SENSITIVE Sensitive     PIP/TAZO <=4 SENSITIVE Sensitive     * 1,000 COLONIES/mL ESCHERICHIA COLI    Labs: CBC: Recent Labs  Lab 01/25/23 1824 01/25/23 1851 01/26/23 0728 01/26/23 1244 01/26/23 1930 01/27/23 1235 01/28/23 0107 01/29/23 0113 01/30/23 0448  WBC 5.3   < > 21.9*  --   --  9.7 6.8 7.3 7.2  NEUTROABS 4.8  --  18.9*  --   --   --  4.5 4.5 4.7  HGB 11.3*   < > 9.4*   < > 9.3* 9.8* 8.4* 8.9* 9.1*  HCT 40.2   < > 32.8*   < > 32.7* 33.4* 28.8* 30.9* 31.8*  MCV 88.0   < > 85.4  --   --  85.6 82.8 82.8 82.8  PLT 257   < > 222  --   --  198 185 226 257   < > = values in this interval not displayed.   Basic Metabolic Panel: Recent Labs  Lab 01/26/23 0402 01/26/23 1930 01/27/23 1235 01/28/23 0107 01/29/23 0113 01/30/23 0448  NA 140 137 137 136 139 139  K 3.2* 3.7 3.8 3.4* 3.9 3.6  CL 106 106 107 104 105 108  CO2 23 24 23 24 26 26   GLUCOSE 212* 140* 130* 189* 113* 130*  BUN 22 23 22  26* 25* 22  CREATININE 1.40* 1.57* 1.48* 1.52* 1.42* 1.40*  CALCIUM 8.2* 8.1* 8.2* 7.9* 8.3* 8.7*  MG 1.5* 2.0 2.2 1.9  --   --   PHOS 2.9  --   --  2.5  --   --    Liver Function Tests: Recent Labs  Lab 01/25/23 1824  AST 25  ALT 21  ALKPHOS 95  BILITOT 0.6  PROT 5.9*  ALBUMIN 2.8*   CBG: Recent Labs  Lab 01/29/23 0911 01/29/23 1132 01/29/23 1635 01/29/23 2005 01/30/23 0915  GLUCAP 105* 140* 112* 119* 116*    Discharge time spent: greater than 30 minutes.  Signed: Jonah Blue, MD [facility] 01/30/2023

## 2023-01-30 NOTE — Progress Notes (Signed)
Pt discharged to [facility] via [facility] with all belongings and paperwork

## 2023-01-30 NOTE — TOC Transition Note (Signed)
Transition of Care [facility]) - CM/SW Discharge Note   Patient Details  Name: Reginald [name] MRN: [id number] Date of Birth: 04-28-1936  [facility]) CM/SW Contact:  Mearl Latin, LCSW Phone Number: 01/30/2023, 3:23 PM   Clinical Narrative:    Patient will DC to: [facility] Anticipated DC date: 01/30/23 Family notified: Patient declined for CSW to call family Transport by: Sharin Mons   Per MD patient ready for DC to [place]. RN to call report prior to discharge [phone]). RN, patient, patient's family, and facility notified of DC. Discharge Summary and FL2 sent to facility. DC packet on chart including signed DNR. Ambulance transport requested for patient.   CSW will sign off for now as social work intervention is no longer needed. Please consult [place] again if new needs arise.     Final next level of care: Skilled Nursing Facility Barriers to Discharge: Barriers Resolved   Patient Goals and CMS Choice CMS Medicare.gov Compare Post Acute Care list provided to:: Patient Choice offered to / list presented to : Adult Children  Discharge Placement     Existing PASRR number confirmed : 01/30/23          Patient [facility]: [facility] Patient to be transferred to facility by: PTAR Name of family member notified: Declined Patient and family notified of of transfer: 01/30/23  Discharge Plan and Services Additional resources added to the After Visit Summary for   In-house Referral: Clinical Social Work   Post Acute Care Choice: Skilled Nursing Facility                               Social Determinants of Health (SDOH) Interventions SDOH Screenings   Food Insecurity: No Food Insecurity (12/01/2022)  Housing: Low Risk  (12/01/2022)  Transportation Needs: No Transportation Needs (12/01/2022)  Utilities: Not At Risk (12/01/2022)  Depression (PHQ2-9): Low Risk  (07/20/2020)  Tobacco Use: Low Risk  (01/17/2023)     Readmission Risk Interventions     No data  to display

## 2023-01-30 NOTE — NC FL2 (Addendum)
[place] MEDICAID FL2 LEVEL OF CARE FORM     IDENTIFICATION  Patient Name: Reginald [name] Birthdate: July 07, 1936 Sex: male Admission Date (Current Location): 01/25/2023  [facility] and [place] Number:  Producer, television/film/video and Address:  [facility], [address], [place], [place] [address]      Provider Number: [id number]  Attending Physician Name and Address:  Jonah Blue, MD  Relative Name and Phone Number:       Current Level of Care: Hospital Recommended Level of Care: Skilled Nursing Facility Prior Approval Number:    Date Approved/Denied:   PASRR Number: [id number]  Discharge Plan: SNF    Current Diagnoses: Patient Active Problem List   Diagnosis Date Noted   Septic shock due to Escherichia coli (HCC) 01/27/2023   Pressure injury of skin 01/27/2023   Anemia 11/28/2022   Chronic obstructive pulmonary disease (HCC) 11/28/2022   Chronic hypoxic respiratory failure (HCC) 11/28/2022   Anemia, posthemorrhagic, acute 11/28/2022   Anticoagulated 11/28/2022   AVM (arteriovenous malformation) of small bowel, acquired 11/28/2022   AVM (arteriovenous malformation) of stomach, acquired 11/28/2022   Upper GI bleed 11/27/2022   Gastroesophageal reflux disease 09/14/2022   Atypical chest pain 09/13/2022   Chest pain 07/01/2021   History of CVA with residual deficit 07/01/2021   Pneumothorax 04/08/2021   Stroke (HCC) 08/05/2020   Vision loss of right eye 06/09/2020   Acute on chronic renal failure (HCC) 12/19/2018   Acute renal failure superimposed on stage 4 chronic kidney disease (HCC) 12/19/2018   AKI (acute kidney injury) (HCC) 11/26/2018   HCAP (healthcare-associated pneumonia) 11/15/2018   COVID-19 11/15/2018   Chronic venous insufficiency 10/04/2017   Venous stasis ulcers of both lower extremities (HCC) 10/04/2017   Nonspecific chest pain 10/27/2016   Morbid obesity (HCC) 05/02/2016   Lymphedema 05/02/2016   Abnormal laboratory  test result 10/15/2015   Cough 10/08/2015   Hematuria 09/21/2015   Hypokalemia 08/13/2015   Hypertension associated with diabetes (HCC) 08/13/2015   Multiple open wounds of lower leg 07/22/2015   CKD (chronic kidney disease) stage 3, GFR 30-59 ml/min (HCC) 07/22/2015   Cellulitis of leg, right 06/28/2015   Acute kidney injury superimposed on CKD (HCC) 06/28/2015   Hypertensive heart disease with CHF (congestive heart failure) (HCC) 05/19/2015   CAD (coronary artery disease), native coronary artery 05/19/2015   Hyperlipidemia associated with type 2 diabetes mellitus (HCC) 05/19/2015   Vitamin D deficiency 05/19/2015   Pressure ulcer 05/16/2015   Diastolic dysfunction with chronic heart failure (HCC) 05/08/2015   Blisters of multiple sites 05/08/2015   Type 2 diabetes mellitus (HCC)    Obesity, unspecified    OSA (obstructive sleep apnea)    Permanent atrial fibrillation (HCC)    Secondary DM with CKD stage 4 and hypertension (HCC)    Glaucoma    Cellulitis 05/07/2015    Orientation RESPIRATION BLADDER Height & Weight     Self, Time, Situation, Place  Normal Incontinent, External catheter Weight: 268 lb 15.4 oz (122 kg) Height:     BEHAVIORAL SYMPTOMS/MOOD NEUROLOGICAL BOWEL NUTRITION STATUS      Incontinent Diet (See dc summary)  AMBULATORY STATUS COMMUNICATION OF NEEDS Skin   Extensive Assist Verbally PU Stage and Appropriate Care (Stage II on arm; ulcer on pre-tibial)                       Personal Care Assistance Level of Assistance  Bathing, Feeding, Dressing Bathing Assistance: Maximum  assistance Feeding assistance: Limited assistance Dressing Assistance: Limited assistance     Functional Limitations Info             SPECIAL CARE FACTORS FREQUENCY                       Contractures Contractures Info: Not present    Additional Factors Info  Code Status, Allergies, Insulin Sliding Scale Code Status Info: DNR Allergies Info: Adhesive   Insulin  Sliding Scale Info: see dc summary       Current Medications (01/30/2023):  This is the current hospital active medication list Current Facility-Administered Medications  Medication Dose Route Frequency Provider Last Rate Last Admin   0.9 %  sodium chloride infusion  250 mL Intravenous Continuous Cloyd Stagers M, PA-C   Stopped at 01/26/23 1610   acetaminophen (TYLENOL) tablet 650 mg  650 mg Oral Q6H PRN Lorin Glass, MD   650 mg at 01/27/23 1940   albuterol (PROVENTIL) (2.5 MG/3ML) 0.083% nebulizer solution 3 mL  3 mL Inhalation BID PRN Briant Sites, DO       apixaban Everlene Balls) tablet 5 mg  5 mg Oral BID Modena Slater, DO   5 mg at 01/30/23 3244   atorvastatin (LIPITOR) tablet 40 mg  40 mg Oral Noemi Chapel, MD   40 mg at 01/29/23 2116   atropine 1 % ophthalmic solution 1 drop  1 drop Right Eye Daily Jonah Blue, MD   1 drop at 01/30/23 0846   cephALEXin (KEFLEX) capsule 1,000 mg  1,000 mg Oral Q6H Jonah Blue, MD       Chlorhexidine Gluconate Cloth 2 % PADS 6 each  6 each Topical Daily Briant Sites, DO   6 each at 01/30/23 0843   Chlorhexidine Gluconate Cloth 2 % PADS 6 each  6 each Topical Q0600 Olalere, Adewale A, MD   6 each at 01/30/23 0423   docusate sodium (COLACE) capsule 100 mg  100 mg Oral BID PRN Cloyd Stagers M, PA-C       dorzolamide-timolol (COSOPT) 2-0.5 % ophthalmic solution 1 drop  1 drop Both Eyes BID Jonah Blue, MD   1 drop at 01/30/23 0846   gabapentin (NEURONTIN) capsule 200 mg  200 mg Oral Noemi Chapel, MD   200 mg at 01/29/23 2117   Gerhardt's butt cream   Topical TID Tomma Lightning, MD   Given at 01/30/23 0844   hydrocortisone cream 1 %   Topical BID Modena Slater, DO   Given at 01/30/23 0845   insulin aspart (novoLOG) injection 0-15 Units  0-15 Units Subcutaneous TID WC Briant Sites, DO   2 Units at 01/29/23 1243   insulin aspart (novoLOG) injection 0-5 Units  0-5 Units Subcutaneous QHS Briant Sites, DO   2 Units  at 01/26/23 0124   latanoprost (XALATAN) 0.005 % ophthalmic solution 1 drop  1 drop Left Eye Noemi Chapel, MD   1 drop at 01/29/23 2202   metoprolol succinate (TOPROL-XL) 24 hr tablet 12.5 mg  12.5 mg Oral Daily Jonah Blue, MD   12.5 mg at 01/30/23 1153   mupirocin ointment (BACTROBAN) 2 % 1 Application  1 Application Nasal BID Virl Diamond A, MD   1 Application at 01/30/23 0843   Oral care mouth rinse  15 mL Mouth Rinse PRN Briant Sites, DO       pantoprazole (PROTONIX) EC tablet 40 mg  40 mg Oral BID Briant Sites, DO   40  mg at 01/30/23 0843   polyethylene glycol (MIRALAX / GLYCOLAX) packet 17 g  17 g Oral Daily PRN Cloyd Stagers M, PA-C       polyvinyl alcohol (LIQUIFILM TEARS) 1.4 % ophthalmic solution 1 drop  1 drop Both Eyes TID Jonah Blue, MD   1 drop at 01/30/23 0844   prednisoLONE acetate (PRED FORTE) 1 % ophthalmic suspension 1 drop  1 drop Right Eye BID Jonah Blue, MD   1 drop at 01/30/23 0845   sertraline (ZOLOFT) tablet 25 mg  25 mg Oral Daily Briant Sites, DO   25 mg at 01/30/23 0843   tamsulosin (FLOMAX) capsule 0.4 mg  0.4 mg Oral QPC supper Briant Sites, DO   0.4 mg at 01/29/23 1758   umeclidinium bromide (INCRUSE ELLIPTA) 62.5 MCG/ACT 1 puff  1 puff Inhalation Daily Briant Sites, DO   1 puff at 01/30/23 0818     Discharge Medications: Please see discharge summary for a list of discharge medications.  Relevant Imaging Results:  Relevant Lab Results:   Additional Information [id number]  Mearl Latin, LCSW

## 2023-01-30 NOTE — Care Management Important Message (Signed)
Important Message  Patient Details  Name: DANNA CASELLA MRN: [id number] Date of Birth: October 03, 1935   Medicare Important Message Given:  Yes     Sherilyn Banker 01/30/2023, 1:16 PM

## 2023-01-30 NOTE — Progress Notes (Signed)
Report called to [facility] [phone] spoke to the nurse who would be taking care of patient in room 402b when he returns. Nurse verbalized understanding of report and stated that she was familiar with pt and had no further questions. Pt currently in room waiting for [facility]AR

## 2023-01-30 NOTE — TOC Progression Note (Signed)
Transition of Care [facility]) - Progression Note    Patient Details  Name: Reginald [name] MRN: [id number] Date of Birth: [birth year]  [facility]) CM/SW Contact  Mearl Latin, LCSW Phone Number: 01/30/2023, 12:53 PM  Clinical Narrative:    CSW made [facility] aware of discharge today. CSW awaiting call back.    Expected Discharge Plan: Long Term Nursing Home Barriers to Discharge: Continued Medical Work up  Expected Discharge Plan and Services     Post Acute Care Choice: Skilled Nursing Facility Living arrangements for the past 2 months: Skilled Nursing Facility Expected Discharge Date: 01/30/23                                     Social Determinants of Health (SDOH) Interventions SDOH Screenings   Food Insecurity: No Food Insecurity (12/01/2022)  Housing: Low Risk  (12/01/2022)  Transportation Needs: No Transportation Needs (12/01/2022)  Utilities: Not At Risk (12/01/2022)  Depression (PHQ2-9): Low Risk  (07/20/2020)  Tobacco Use: Low Risk  (01/17/2023)    Readmission Risk Interventions     No data to display

## 2023-04-10 ENCOUNTER — Encounter: Payer: Self-pay | Admitting: Pulmonary Disease

## 2023-04-10 ENCOUNTER — Ambulatory Visit (INDEPENDENT_AMBULATORY_CARE_PROVIDER_SITE_OTHER): Payer: Medicare Other | Admitting: Pulmonary Disease

## 2023-04-10 VITALS — BP 123/64 | HR 73 | Ht 71.0 in | Wt 252.0 lb

## 2023-04-10 DIAGNOSIS — G4733 Obstructive sleep apnea (adult) (pediatric): Secondary | ICD-10-CM | POA: Diagnosis not present

## 2023-04-10 DIAGNOSIS — J9611 Chronic respiratory failure with hypoxia: Secondary | ICD-10-CM | POA: Diagnosis not present

## 2023-04-10 NOTE — Progress Notes (Signed)
Reginald [name]    [id number]    [birth year]  Primary Care Physician:Blake, Denny Levy, MD  Referring Physician: Karna Dupes, MD [address] [place],  [place] [address]  Chief complaint:   Patient being seen for history of obstructive sleep apnea, chronic respiratory failure COPD  HPI:  In for follow-up today  Has been doing relatively well His breathing has been okay  He does have a CPAP that he is not using on a regular basis He does use oxygen at night sometimes  When I asked him for the reason for not using his CPAP he mentioned laziness I encouraged him to ensure that somebody helps him to put it on on a nightly basis if needed  Most optimal treatment for his obstructive sleep apnea is still CPAP therapy than just oxygen supplementation  He is on oxygen supplementation during the day  He states his breathing has been relatively fine since the last time he was here  Compliant with inhalers  He gets in bed quite early in the evening but does not go to sleep until about midnight Takes him a few hours to fall asleep Will usually get up about 430 to 5 AM to get changed, then goes back to sleep His time out of bed varies a lot Not sure about his mealtimes as well  Never smoked  Noted on his medications is that he is on Spiriva and also on nebulization treatments  Does have a history of heart disease and history of heart failure, atrial fibrillation, coronary artery disease, hypertension, chronic kidney disease stage IV, diabetes  History of a stroke to left him with 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   albuterol (VENTOLIN HFA) 108 (90 Base) MCG/ACT inhaler Inhale 2 puffs into the lungs 2 (two) times daily as needed for wheezing or shortness of breath.   alum & mag  hydroxide-simeth (MAALOX/MYLANTA) 200-200-20 MG/5 SUSP Apply 10 mLs topically every 6 (six) hours as needed (GERD symptoms).   apixaban (ELIQUIS) 5 MG TABS tablet Take 1 tablet (5 mg total) by mouth 2 (two) times daily.   atorvastatin (LIPITOR) 40 MG tablet Take 40 mg by mouth at bedtime.   atropine 1 % ophthalmic solution Place 1 drop into the right eye daily.   cephALEXin (KEFLEX) 500 MG capsule Take 2 capsules (1,000 mg total) by mouth every 6 (six) hours.   chlorhexidine (PERIDEX) 0.12 % solution Use as directed 15 mLs in the mouth or throat 2 (two) times daily.   Cholecalciferol (VITAMIN D-3) 25 MCG (1000 UT) CAPS Take 2,000 Units by mouth daily.   dorzolamide-timolol (COSOPT) 22.3-6.8 MG/ML ophthalmic solution Place 1 drop into both eyes 2 (two) times daily.   Dulaglutide (TRULICITY) 3 MG/0.5ML SOPN Inject 3 mg into the skin every Monday.   ferrous gluconate (FERGON) 324 MG tablet Take 324 mg by mouth at bedtime.   furosemide (LASIX) 20 MG tablet Take 20 mg by mouth daily.   gabapentin (NEURONTIN) 100 MG capsule Take 2 capsules (200 mg total) by mouth at bedtime as needed. (Patient taking differently: Take 200 mg by mouth at bedtime.)   hydrocortisone cream 1 % Apply topically 2 (two) times daily.   ipratropium (ATROVENT) 0.02 % nebulizer solution Take 0.5 mg by nebulization every 6 (six)  hours as needed for wheezing or shortness of breath.   latanoprost (XALATAN) 0.005 % ophthalmic solution Place 1 drop into the left eye at bedtime.   magnesium oxide (MAG-OX) 400 MG tablet Take 400 mg by mouth daily.   Menthol, Topical Analgesic, (BIOFREEZE) 4 % GEL Apply 1 application  topically in the morning and at bedtime. To neck and shoulder   metoprolol succinate (TOPROL XL) 25 MG 24 hr tablet Take 0.5 tablets (12.5 mg total) by mouth daily.   nitroGLYCERIN (NITROSTAT) 0.4 MG SL tablet Place 0.4 mg under the tongue every 5 (five) minutes x 3 doses as needed for chest pain (and CALL PROVIDER IF NO  RELIEF AFTER THE 3RD DOSE).   Nystatin (GERHARDT'S BUTT CREAM) CREA Apply 1 Application topically 3 (three) times daily.   OXYGEN Inhale 2 L into the lungs at bedtime.   pantoprazole (PROTONIX) 40 MG tablet Take 1 tablet (40 mg total) by mouth 2 (two) times daily.   Polyethyl Glyc-Propyl Glyc PF (SYSTANE PRESERVATIVE FREE) 0.4-0.3 % SOLN Place 1 drop into both eyes 3 (three) times daily.   polyethylene glycol (MIRALAX / GLYCOLAX) 17 g packet Take 17 g by mouth daily.   prednisoLONE acetate (PRED FORTE) 1 % ophthalmic suspension Place 1 drop into the right eye 2 (two) times daily.   sennosides-docusate sodium (SENOKOT-S) 8.6-50 MG tablet Take 2 tablets by mouth 2 (two) times daily.   sertraline (ZOLOFT) 25 MG tablet Take 25 mg by mouth daily.   SPIRIVA RESPIMAT 2.5 MCG/ACT AERS Inhale 1 each into the lungs daily.   tamsulosin (FLOMAX) 0.4 MG CAPS capsule Take 1 capsule (0.4 mg total) by mouth daily after supper.   VASCEPA 1 g capsule Take 1 g by mouth every evening.   vitamin B-12 (CYANOCOBALAMIN) 1000 MCG tablet Take 1,000 mcg by mouth daily.   Zinc Oxide (DESITIN) 40 % PSTE Apply 1 Application topically See admin instructions. Apply topically to sacrum and buttocks every shift and as needed for skin protection.   No facility-administered encounter medications on file as of 04/10/2023.    Allergies as of 04/10/2023 - Review Complete 01/25/2023  Allergen Reaction Noted   Adhesive [tape] Rash 08/11/2016    Past Medical History:  Diagnosis Date   Anteroseptal myocardial infarction [facility])    Carotid artery occlusion    CHF (congestive heart failure) (HCC)    Chronic kidney disease    COPD (chronic obstructive pulmonary disease) (HCC)    Coronary atherosclerosis of unspecified type of vessel, native or graft    a. s/p prior PCI to LAD in 1997   Diverticulosis    ED (erectile dysfunction)    Foley catheter in place    Glaucoma    Hematuria    History of 2019 novel coronavirus disease  (COVID-19) 11/2018   Hydronephrosis, bilateral    Hydroureter    Hypercholesteremia    Hypertensive renal disease    Hypogonadism male    Hypokalemia    Obesity hypoventilation syndrome (HCC)    Obesity, unspecified    OSA (obstructive sleep apnea)    Peripheral vascular disease (HCC)    Permanent atrial fibrillation (HCC)    Phimosis    Pneumonia    Rhabdomyolysis    Stroke (HCC)    Tremor    Type II or unspecified type diabetes mellitus without mention of complication, not stated as uncontrolled    Unspecified essential hypertension    Urinary incontinence    Urinary retention    Vestibulopathy  Past Surgical History:  Procedure Laterality Date   APPENDECTOMY  1985   CARDIAC CATHETERIZATION     CHOLECYSTECTOMY  06/2000   CIRCUMCISION N/A 05/28/2020   Procedure: CIRCUMCISION ADULT;  Surgeon: Marcine Matar, MD;  Location: [facility] ORS;  Service: Urology;  Laterality: N/A;   CORONARY ANGIOPLASTY  08/14/1995   stent placement to LAD    DORSAL SLIT N/A 05/28/2020   Procedure: DORSAL SLIT;  Surgeon: Marcine Matar, MD;  Location: [facility]S;  Service: Urology;  Laterality: N/A;  45 MINS   ENDOVENOUS ABLATION SAPHENOUS VEIN W/ LASER Left 02/21/2018   endovenous laser ablation L GSV by Fabienne Bruns MD    ENTEROSCOPY N/A 11/28/2022   Procedure: ENTEROSCOPY;  Surgeon: Benancio Deeds, MD;  Location[facility] ENDOSCOPY;  Service: Gastroenterology;  Laterality: N/A;   HOT HEMOSTASIS N/A 11/28/2022   Procedure: HOT HEMOSTASIS (ARGON PLASMA COAGULATION/BICAP);  Surgeon: Benancio Deeds, MD;  Location: [facility] ENDOSCOPY;  Service: Gastroenterology;  Laterality: N/A;   INGUINAL HERNIA REPAIR Right 1985   KNEE ARTHROSCOPY Left 1991   LUMBAR FUSION     NOSE SURGERY  1970s   Per Dr. Sharyn Lull [facility] in pt chart   TRANSCAROTID ARTERY REVASCULARIZATION  Right 06/12/2020   Procedure: RIGHT TRANSCAROTID ARTERY REVASCULARIZATION;  Surgeon: Nada Libman, MD;  Location: [facility] OR;  Service:  Vascular;  Laterality: Right;    Family History  Problem Relation Age of Onset   COPD Mother    Heart disease Mother    Diabetes Father    Heart disease Father    Diabetes Sister    Heart disease Brother    Heart disease Brother    Breast cancer Maternal Aunt    Diabetes Paternal Grandmother    Colon cancer Maternal Aunt    Ovarian cancer Daughter    Glaucoma Other     Social History   Socioeconomic History   Marital status: Single    Spouse name: Not on file   Number of children: 5   Years of education: Not on file   Highest education level: Not on file  Occupational History   Occupation: retired  Tobacco Use   Smoking status: Never    Passive exposure: Never   Smokeless tobacco: Never  Vaping Use   Vaping status: Never Used  Substance and Sexual Activity   Alcohol use: No    Alcohol/week: 0.0 standard drinks of alcohol   Drug use: No   Sexual activity: Never  Other Topics Concern   Not on file  Social History Narrative   02/17/21 lives at [facility]   Social Determinants of Health   Financial Resource Strain: Not on file  Food Insecurity: No Food Insecurity (12/01/2022)   Hunger Vital Sign    Worried About Running Out of Food in the Last Year: Never true    Ran Out of Food in the Last Year: Never true  Transportation Needs: No Transportation Needs (12/01/2022)   PRAPARE - Administrator, Civil Service (Medical): No    Lack of Transportation (Non-Medical): No  Physical Activity: Not on file  Stress: Not on file  Social Connections: Not on file  Intimate Partner Violence: Not At Risk (12/01/2022)   Humiliation, Afraid, Rape, and Kick questionnaire    Fear of Current or Ex-Partner: No    Emotionally Abused: No    Physically Abused: No    Sexually Abused: No    Review of Systems  Respiratory:  Positive for apnea and shortness of  breath.   Psychiatric/Behavioral:  Positive for sleep disturbance.     Vitals:   04/10/23 1313  BP:  123/64  Pulse: 73  SpO2: 100%     Physical Exam Constitutional:      Appearance: He is obese.  HENT:     Head: Normocephalic.     Mouth/Throat:     Mouth: Mucous membranes are moist.  Eyes:     General: No scleral icterus.    Pupils: Pupils are equal, round, and reactive to light.  Cardiovascular:     Rate and Rhythm: Normal rate and regular rhythm.     Heart sounds: No murmur heard.    No friction rub.  Pulmonary:     Effort: No respiratory distress.     Breath sounds: No stridor. No wheezing or rhonchi.     Comments: Decreased air movement bilaterally Musculoskeletal:     Cervical back: No rigidity or tenderness.  Neurological:     Mental Status: He is alert.  Psychiatric:        Mood and Affect: Mood normal.       09/30/2022    1:00 PM  Results of the Epworth flowsheet  Sitting and reading 0  Watching TV 2  Sitting, inactive in a public place (e.g. a theatre or a meeting) 2  As a passenger in a car for an hour without a break 2  Lying down to rest in the afternoon when circumstances permit 2  Sitting and talking to someone 0  Sitting quietly after a lunch without alcohol 2  In a car, while stopped for a few minutes in traffic 0  Total score 10     Data Reviewed: Records that was sent over was reviewed listed in his medical conditions and medications  Sleep study performed 10/20/2022 showing mild obstructive sleep apnea, with moderately severe oxygen desaturations  Assessment:  History of obstructive sleep apnea -He does have a CPAP to use on a nightly basis -Denies having significant difficulty tolerating the CPAP  Encouraged him to enlist help to make sure he uses it every night  Daytime sleepiness is likely multifactorial  Daytime sleepiness -Likely related to untreated sleep disordered breathing  Insomnia  History of stroke  History of atrial fibrillation  History of obstructive lung disease  History of diastolic heart  failure   Plan/Recommendations: Encouraged to use CPAP nightly  Continue oxygen supplementation into CPAP  Continue oxygen supplementation during the day  Continue Spiriva, continue nebulization treatments  Call us with significant concerns  Follow-up in 3 months    Virl Diamond MD [facility] 04/10/2023, 1:23 PM  CC: Karna Dupes, MD

## 2023-04-10 NOTE — Patient Instructions (Signed)
Continue using CPAP nightly  Try and remember to put your CPAP on nightly  Oxygen supplementation by itself does not fix sleep disordered breathing  I think you will continue to benefit from CPAP use  Continue Spiriva  Continue nebulization treatments  Continue oxygen supplementation during the day  Call us with significant concerns  Follow-up in 3 months

## 2023-04-16 ENCOUNTER — Emergency Department (HOSPITAL_COMMUNITY): Payer: Medicare Other

## 2023-04-16 ENCOUNTER — Inpatient Hospital Stay (HOSPITAL_COMMUNITY)
Admission: EM | Admit: 2023-04-16 | Discharge: 2023-05-12 | DRG: 064 | Disposition: E | Payer: Medicare Other | Source: Skilled Nursing Facility | Attending: Student in an Organized Health Care Education/Training Program | Admitting: Student in an Organized Health Care Education/Training Program

## 2023-04-16 ENCOUNTER — Other Ambulatory Visit: Payer: Self-pay

## 2023-04-16 ENCOUNTER — Encounter (HOSPITAL_COMMUNITY): Payer: Self-pay

## 2023-04-16 DIAGNOSIS — I509 Heart failure, unspecified: Secondary | ICD-10-CM | POA: Diagnosis present

## 2023-04-16 DIAGNOSIS — E66812 Obesity, class 2: Secondary | ICD-10-CM | POA: Diagnosis present

## 2023-04-16 DIAGNOSIS — R1310 Dysphagia, unspecified: Secondary | ICD-10-CM | POA: Diagnosis present

## 2023-04-16 DIAGNOSIS — I619 Nontraumatic intracerebral hemorrhage, unspecified: Principal | ICD-10-CM | POA: Diagnosis present

## 2023-04-16 DIAGNOSIS — G919 Hydrocephalus, unspecified: Secondary | ICD-10-CM | POA: Diagnosis present

## 2023-04-16 DIAGNOSIS — Z803 Family history of malignant neoplasm of breast: Secondary | ICD-10-CM

## 2023-04-16 DIAGNOSIS — I2510 Atherosclerotic heart disease of native coronary artery without angina pectoris: Secondary | ICD-10-CM | POA: Diagnosis present

## 2023-04-16 DIAGNOSIS — Z7901 Long term (current) use of anticoagulants: Secondary | ICD-10-CM

## 2023-04-16 DIAGNOSIS — Z6836 Body mass index (BMI) 36.0-36.9, adult: Secondary | ICD-10-CM

## 2023-04-16 DIAGNOSIS — I69354 Hemiplegia and hemiparesis following cerebral infarction affecting left non-dominant side: Secondary | ICD-10-CM

## 2023-04-16 DIAGNOSIS — D6832 Hemorrhagic disorder due to extrinsic circulating anticoagulants: Secondary | ICD-10-CM | POA: Diagnosis present

## 2023-04-16 DIAGNOSIS — J449 Chronic obstructive pulmonary disease, unspecified: Secondary | ICD-10-CM | POA: Diagnosis present

## 2023-04-16 DIAGNOSIS — Z8249 Family history of ischemic heart disease and other diseases of the circulatory system: Secondary | ICD-10-CM

## 2023-04-16 DIAGNOSIS — E669 Obesity, unspecified: Secondary | ICD-10-CM | POA: Diagnosis present

## 2023-04-16 DIAGNOSIS — I674 Hypertensive encephalopathy: Secondary | ICD-10-CM | POA: Diagnosis present

## 2023-04-16 DIAGNOSIS — E1122 Type 2 diabetes mellitus with diabetic chronic kidney disease: Secondary | ICD-10-CM | POA: Diagnosis present

## 2023-04-16 DIAGNOSIS — Z7982 Long term (current) use of aspirin: Secondary | ICD-10-CM

## 2023-04-16 DIAGNOSIS — J9611 Chronic respiratory failure with hypoxia: Secondary | ICD-10-CM | POA: Diagnosis present

## 2023-04-16 DIAGNOSIS — I618 Other nontraumatic intracerebral hemorrhage: Secondary | ICD-10-CM | POA: Diagnosis present

## 2023-04-16 DIAGNOSIS — Z515 Encounter for palliative care: Secondary | ICD-10-CM | POA: Diagnosis not present

## 2023-04-16 DIAGNOSIS — Z9981 Dependence on supplemental oxygen: Secondary | ICD-10-CM

## 2023-04-16 DIAGNOSIS — I610 Nontraumatic intracerebral hemorrhage in hemisphere, subcortical: Principal | ICD-10-CM | POA: Diagnosis present

## 2023-04-16 DIAGNOSIS — G935 Compression of brain: Secondary | ICD-10-CM | POA: Diagnosis present

## 2023-04-16 DIAGNOSIS — Z66 Do not resuscitate: Secondary | ICD-10-CM | POA: Diagnosis present

## 2023-04-16 DIAGNOSIS — D689 Coagulation defect, unspecified: Secondary | ICD-10-CM | POA: Diagnosis present

## 2023-04-16 DIAGNOSIS — I130 Hypertensive heart and chronic kidney disease with heart failure and stage 1 through stage 4 chronic kidney disease, or unspecified chronic kidney disease: Secondary | ICD-10-CM | POA: Diagnosis present

## 2023-04-16 DIAGNOSIS — I615 Nontraumatic intracerebral hemorrhage, intraventricular: Secondary | ICD-10-CM | POA: Diagnosis present

## 2023-04-16 DIAGNOSIS — Z800 Family history of malignant neoplasm of digestive organs: Secondary | ICD-10-CM

## 2023-04-16 DIAGNOSIS — N189 Chronic kidney disease, unspecified: Secondary | ICD-10-CM | POA: Diagnosis present

## 2023-04-16 DIAGNOSIS — I4891 Unspecified atrial fibrillation: Secondary | ICD-10-CM | POA: Diagnosis present

## 2023-04-16 DIAGNOSIS — H547 Unspecified visual loss: Secondary | ICD-10-CM | POA: Diagnosis present

## 2023-04-16 DIAGNOSIS — G4733 Obstructive sleep apnea (adult) (pediatric): Secondary | ICD-10-CM | POA: Diagnosis present

## 2023-04-16 DIAGNOSIS — E7800 Pure hypercholesterolemia, unspecified: Secondary | ICD-10-CM | POA: Diagnosis present

## 2023-04-16 DIAGNOSIS — I69398 Other sequelae of cerebral infarction: Secondary | ICD-10-CM

## 2023-04-16 DIAGNOSIS — E1151 Type 2 diabetes mellitus with diabetic peripheral angiopathy without gangrene: Secondary | ICD-10-CM | POA: Diagnosis present

## 2023-04-16 DIAGNOSIS — Z79899 Other long term (current) drug therapy: Secondary | ICD-10-CM

## 2023-04-16 DIAGNOSIS — Z9861 Coronary angioplasty status: Secondary | ICD-10-CM

## 2023-04-16 DIAGNOSIS — I6389 Other cerebral infarction: Secondary | ICD-10-CM | POA: Diagnosis not present

## 2023-04-16 DIAGNOSIS — Z825 Family history of asthma and other chronic lower respiratory diseases: Secondary | ICD-10-CM

## 2023-04-16 DIAGNOSIS — Z8616 Personal history of COVID-19: Secondary | ICD-10-CM

## 2023-04-16 DIAGNOSIS — I69391 Dysphagia following cerebral infarction: Secondary | ICD-10-CM

## 2023-04-16 DIAGNOSIS — I252 Old myocardial infarction: Secondary | ICD-10-CM

## 2023-04-16 DIAGNOSIS — G8191 Hemiplegia, unspecified affecting right dominant side: Secondary | ICD-10-CM | POA: Diagnosis present

## 2023-04-16 DIAGNOSIS — R29724 NIHSS score 24: Secondary | ICD-10-CM | POA: Diagnosis present

## 2023-04-16 DIAGNOSIS — T45515A Adverse effect of anticoagulants, initial encounter: Secondary | ICD-10-CM | POA: Diagnosis present

## 2023-04-16 DIAGNOSIS — Z833 Family history of diabetes mellitus: Secondary | ICD-10-CM

## 2023-04-16 LAB — COMPREHENSIVE METABOLIC PANEL
ALT: 17 U/L (ref 0–44)
AST: 19 U/L (ref 15–41)
Albumin: 3.3 g/dL — ABNORMAL LOW (ref 3.5–5.0)
Alkaline Phosphatase: 87 U/L (ref 38–126)
Anion gap: 9 (ref 5–15)
BUN: 19 mg/dL (ref 8–23)
CO2: 30 mmol/L (ref 22–32)
Calcium: 9.3 mg/dL (ref 8.9–10.3)
Chloride: 104 mmol/L (ref 98–111)
Creatinine, Ser: 1.13 mg/dL (ref 0.61–1.24)
GFR, Estimated: >60 mL/min (ref >60)
Glucose, Bld: 124 mg/dL — ABNORMAL HIGH (ref 70–99)
Potassium: 3.3 mmol/L — ABNORMAL LOW (ref 3.5–5.1)
Sodium: 143 mmol/L (ref 135–145)
Total Bilirubin: 0.6 mg/dL (ref 0.3–1.2)
Total Protein: 6.5 g/dL (ref 6.5–8.1)

## 2023-04-16 LAB — CBC
HCT: 43.6 % (ref 39.0–52.0)
Hemoglobin: 12.7 g/dL — ABNORMAL LOW (ref 13.0–17.0)
MCH: 24.9 pg — ABNORMAL LOW (ref 26.0–34.0)
MCHC: 29.1 g/dL — ABNORMAL LOW (ref 30.0–36.0)
MCV: 85.5 fL (ref 80.0–100.0)
Platelets: 302 10*3/uL (ref 150–400)
RBC: 5.1 MIL/uL (ref 4.22–5.81)
RDW: 16.1 % — ABNORMAL HIGH (ref 11.5–15.5)
WBC: 9.9 10*3/uL (ref 4.0–10.5)
nRBC: 0 % (ref 0.0–0.2)

## 2023-04-16 LAB — I-STAT CHEM 8, ED
BUN: 18 mg/dL (ref 8–23)
Calcium, Ion: 1.23 mmol/L (ref 1.15–1.40)
Chloride: 101 mmol/L (ref 98–111)
Creatinine, Ser: 1.2 mg/dL (ref 0.61–1.24)
Glucose, Bld: 118 mg/dL — ABNORMAL HIGH (ref 70–99)
HCT: 42 % (ref 39.0–52.0)
Hemoglobin: 14.3 g/dL (ref 13.0–17.0)
Potassium: 3.3 mmol/L — ABNORMAL LOW (ref 3.5–5.1)
Sodium: 144 mmol/L (ref 135–145)
TCO2: 30 mmol/L (ref 22–32)

## 2023-04-16 LAB — DIFFERENTIAL
Abs Immature Granulocytes: 0.03 10*3/uL (ref 0.00–0.07)
Basophils Absolute: 0.1 10*3/uL (ref 0.0–0.1)
Basophils Relative: 1 %
Eosinophils Absolute: 0.5 10*3/uL (ref 0.0–0.5)
Eosinophils Relative: 5 %
Immature Granulocytes: 0 %
Lymphocytes Relative: 15 %
Lymphs Abs: 1.5 10*3/uL (ref 0.7–4.0)
Monocytes Absolute: 0.8 10*3/uL (ref 0.1–1.0)
Monocytes Relative: 8 %
Neutro Abs: 7.1 10*3/uL (ref 1.7–7.7)
Neutrophils Relative %: 71 %

## 2023-04-16 LAB — CBG MONITORING, ED: Glucose-Capillary: 119 mg/dL — ABNORMAL HIGH (ref 70–99)

## 2023-04-16 LAB — PROTIME-INR
INR: 1.5 — ABNORMAL HIGH (ref 0.8–1.2)
Prothrombin Time: 18.4 s — ABNORMAL HIGH (ref 11.4–15.2)

## 2023-04-16 LAB — APTT: aPTT: 39 s — ABNORMAL HIGH (ref 24–36)

## 2023-04-16 LAB — ETHANOL: Alcohol, Ethyl (B): <10 mg/dL (ref <10)

## 2023-04-16 MED ORDER — ACETAMINOPHEN 325 MG PO TABS: 650.0000 mg | ORAL_TABLET | ORAL | Status: DC | PRN

## 2023-04-16 MED ORDER — ACETAMINOPHEN 160 MG/5ML PO SOLN: 650.0000 mg | ORAL | Status: DC | PRN

## 2023-04-16 MED ORDER — CLEVIDIPINE BUTYRATE 0.5 MG/ML IV EMUL
0.0000 mg/h | INTRAVENOUS | Status: DC
  Administered 2023-04-16: 2 mg/h via INTRAVENOUS
  Administered 2023-04-17: 6 mg/h via INTRAVENOUS
  Administered 2023-04-17: 4 mg/h via INTRAVENOUS
  Filled 2023-04-16: qty 50
  Filled 2023-04-16 (×2): qty 100

## 2023-04-16 MED ORDER — STROKE: EARLY STAGES OF RECOVERY BOOK
Freq: Once | Status: AC
  Filled 2023-04-16 (×2): qty 1

## 2023-04-16 MED ORDER — IOHEXOL 350 MG/ML SOLN: 60.0000 mL | Freq: Once | INTRAVENOUS | Status: DC | PRN

## 2023-04-16 MED ORDER — EMPTY CONTAINERS FLEXIBLE MISC
900.0000 mg | Freq: Once | Status: AC
  Administered 2023-04-16: 900 mg via INTRAVENOUS
  Filled 2023-04-16: qty 90

## 2023-04-16 MED ORDER — PANTOPRAZOLE SODIUM 40 MG IV SOLR: 40.0000 mg | Freq: Every day | INTRAVENOUS | Status: DC

## 2023-04-16 MED ORDER — SENNOSIDES-DOCUSATE SODIUM 8.6-50 MG PO TABS: 1.0000 | ORAL_TABLET | Freq: Two times a day (BID) | ORAL | Status: DC

## 2023-04-16 MED ORDER — ACETAMINOPHEN 650 MG RE SUPP: 650.0000 mg | RECTAL | Status: DC | PRN

## 2023-04-16 NOTE — ED Triage Notes (Signed)
Pt BIBA from [facility] w/ decreased loc,ams, and lethargy with response only to painful stimuli. Currently responding to voice. Hx of A-fib and stroke but unknown deficits per medics.

## 2023-04-16 NOTE — ED Notes (Signed)
[facility] transport set up.  ?

## 2023-04-16 NOTE — ED Notes (Signed)
Code Stroke called to [phone] @ 9:10pm

## 2023-04-16 NOTE — Progress Notes (Signed)
Telestroke Note    2116: Code stroke cart activated at this time for patient who presents to the ED from a Skilled Nursing Facility for concerns of decreased responsiveness. Per nursing, EMS stated staff at the SNF were unable to provide a LKW time or ROS time. mRS 4. Patient already in CT.   2119: TSP paged.   2122: [facility] at this time. Was transferred three times (at 2122, 2125, and 2127). Unable to obtain any additional information from staff at this time.   2124: Dr. Jean Rosenthal on camera. Patient history, report, and NCCT imaging results provided to Dr. Jean Rosenthal on camera.   2128: Dr. Jean Rosenthal speaking to Dr.Schlossman on camera at this time. Dr. Dalene Seltzer states she will be consulting neurology at [facility] for patient transfer.   2132: Dr. Jean Rosenthal performing neuro evaluation at this time.    Derrill Kay Telestroke RN

## 2023-04-16 NOTE — Consult Note (Signed)
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 It is unclear with the patient's baseline level of functioning as, or what his wishes would be regarding medical care. Recommend Eliquis reversal given the large size and poor GCS, and transfer to facility with neurocritical care, and neurosurgical availability. Discussed with ER provider at bedside who is arranging transfer. In the meantime recommend aggressive blood pressure control, Eliquis reversal, and seizure prophylaxis.  Recommendation:  Diagnostic Studies:      Repeat CT head in first 8-12hrs      CTA head and neck with contrast  Laboratory Studies:       INR/PT       aPTT?       CBC  Medications:       Hold?antiplatelet?therapy/NSAIDS/Anticoagulation       Warfarin/Coumadin/DOAC reversal per hospital protocol       Load with Keppra 1gm now.       Keppra 500mg  bid.  Nursing Recommendations:       Telemetry, IV Fluids?Avoid dextrose containing fluids, Maintain euglycemia       Head of bed 30 degrees       Neuro checks q1-2?hrs?during ICU stay       Once stable neuro checks q4?hrs       keep BP less than 140/90's with goal of 130/80s  Consultations:       Need Neurosurgery consultation?STAT       Recommend Speech therapy if failed dysphagia screen       Physical therapy/Occupational therapy  DVT Prophylaxis:       SCDs  Disposition:       Neurology will  Follow  ------------------------------------------------------------------------------  Metrics: Last Known Well: Unknown [facility] Notification Time: 05/06/2023 21:19:47 Arrival Time: 04/28/2023 20:09:00 Stamp Time: 04/25/2023 21:19:47 Initial Response Time: 04/21/2023 21:23:14 Symptoms: AMS. Initial patient interaction: 04/12/2023 21:32:00 NIHSS Assessment Completed: 04/27/2023 21:34:00 Patient is not a candidate for Thrombolytic. Thrombolytic Medical Decision: 04/30/2023 21:34:00 Patient was not deemed candidate for Thrombolytic because of following reasons: LKW outside 4.5 hr window. . History of previous intracranial hemorrhage, intracranial neoplasm . Use of NOAC in last 48 hrs. .  I personally Reviewed the CT Head and it Showed moderate left thalamic ICH with intraventricular extension  Primary Provider Notified of Diagnostic Impression and Management Plan on: 05/01/2023 21:34:31    History of Present Illness: Patient is a 87 year old Male.  Patient was brought by EMS for symptoms of AMS. This patient is an 87 year old male with a past medical history of A-fib on Eliquis, diabetes, CKD, stroke, and heart failure presenting from a nursing facility for evaluation of altered mental status. Unable to determine last known well, her baseline deficits from facility. Night nurse at the facility states that he was obtunded throughout the night last known well 7 AM yesterday according to her. The patient is unable to communicate or participate in HPI in any way.   Past Medical History:      Diabetes Mellitus      Atrial Fibrillation      Stroke unable to obtain  due to:   Patient Cannot Speak  Medications:  Anticoagulant use:  Yes eliquis No Antiplatelet use Reviewed EMR for current medications  Allergies:  Reviewed Allergies Unable To Obtain Due To: Patient Cannot Speak  Social History: Drug Use: No Unable To Obtain Due To Patient Status : Patient Cannot  Speak  Family History:  Family History Cannot Be Obtained Because:Patient Cannot Speak  ROS : ROS Cannot Be Obtained Because:  Patient Cannot Speak  Past Surgical History: Past Surgical History Cannot Be Obtained Because: Patient Cannot Speak There Is No Surgical History Contributory To Today's Visit    Examination: BP(156/90), Pulse(80), 1A: Level of Consciousness - Movements to Pain + 2 1B: Ask Month and Age - Aphasic + 2 1C: Blink Eyes & Squeeze Hands - Performs 0 Tasks + 2 2: Test Horizontal Extraocular Movements - Normal + 0 3: Test Visual Fields - No Visual Loss + 0 4: Test Facial Palsy (Use Grimace if Obtunded) - Minor paralysis (flat nasolabial fold, smile asymmetry) + 1 5A: Test Left Arm Motor Drift - Drift, hits bed + 2 5B: Test Right Arm Motor Drift - No Movement + 4 6A: Test Left Leg Motor Drift - No Effort Against Gravity + 3 6B: Test Right Leg Motor Drift - No Effort Against Gravity + 3 7: Test Limb Ataxia (FNF/Heel-Shin) - No Ataxia + 0 8: Test Sensation - Normal; No sensory loss + 0 9: Test Language/Aphasia - Mute/Global Aphasia: No Usable Speech/Auditory Comprehension + 3 10: Test Dysarthria - Mute/Anarthric + 2 11: Test Extinction/Inattention - No abnormality + 0 NIHSS Score: 24  ICH Score: 4   GlasGow Coma Score: 5-12 (+1)  Age >= 80: Yes (+1)  ICH volume >= 30mL: Yes (+1)  Intraventricular hemorrhage: Yes (+1)  Infratentorial origin of hemorrhage: No (0)  Pre-Morbid Modified Rankin Scale: 7 Points = Unable to assess   This consult was conducted in real time using interactive audio and Immunologist. Patient was informed of the technology being used for this visit and agreed to proceed. Patient located in hospital and provider located at home/office setting.  Due to the immediate potential for life-threatening deterioration due to underlying acute neurologic illness, I spent 35 minutes providing critical care. This time includes time for  face to face visit via telemedicine, review of medical records, imaging studies and discussion of findings with providers, the patient and/or family.  Dr Jetta Lout  [facility] For Inpatient follow-up with [facility] physician please call [facility] [phone]. This is not an outpatient service. Post hospital discharge, please contact hospital directly.  Please do not communicate with [facility] physicians via secure chat. If you have any questions, Please contact [facility]. Please call or reconsult our service if there are any clinical or diagnostic changes.

## 2023-04-16 NOTE — ED Provider Notes (Signed)
[place] EMERGENCY DEPARTMENT AT [facility] Provider Note   CSN: [id number] Arrival date & time: 04-20-23  2009     History {Add pertinent medical, surgical, social history, OB history to HPI:1} Chief Complaint  Patient presents with   Altered Mental Status    Reginald [name] is a 87 y.o. male.  HPI      Normal conversations, alert oriented Uses wheelchair At 7 o clock night shift started  7AM he was normal.  Modified rankin 4   Past Medical History:  Diagnosis Date   Anteroseptal myocardial infarction [facility])    Carotid artery occlusion    CHF (congestive heart failure) (HCC)    Chronic kidney disease    COPD (chronic obstructive pulmonary disease) (HCC)    Coronary atherosclerosis of unspecified type of vessel, native or graft    a. s/p prior PCI to LAD in 1997   Diverticulosis    ED (erectile dysfunction)    Foley catheter in place    Glaucoma    Hematuria    History of 2019 novel coronavirus disease (COVID-19) 11/2018   Hydronephrosis, bilateral    Hydroureter    Hypercholesteremia    Hypertensive renal disease    Hypogonadism male    Hypokalemia    Obesity hypoventilation syndrome (HCC)    Obesity, unspecified    OSA (obstructive sleep apnea)    Peripheral vascular disease (HCC)    Permanent atrial fibrillation (HCC)    Phimosis    Pneumonia    Rhabdomyolysis    Stroke (HCC)    Tremor    Type II or unspecified type diabetes mellitus without mention of complication, not stated as uncontrolled    Unspecified essential hypertension    Urinary incontinence    Urinary retention    Vestibulopathy      Home Medications Prior to Admission medications   Medication Sig Start Date End Date Taking? Authorizing Provider  acetaminophen (TYLENOL) 500 MG tablet Take 500 mg by mouth every 8 (eight) hours as needed (pain).    [provider]  albuterol (VENTOLIN HFA) 108 (90 Base) MCG/ACT inhaler Inhale 2 puffs into the lungs 2 (two)  times daily as needed for wheezing or shortness of breath. 07/03/21   Marrion Coy, MD  alum & mag hydroxide-simeth (MAALOX/MYLANTA) 200-200-20 MG/5 SUSP Apply 10 mLs topically every 6 (six) hours as needed (GERD symptoms).    [provider]  apixaban (ELIQUIS) 5 MG TABS tablet Take 1 tablet (5 mg total) by mouth 2 (two) times daily. 12/03/22   Ghimire, Werner Lean, MD  atorvastatin (LIPITOR) 40 MG tablet Take 40 mg by mouth at bedtime.    [provider]  atropine 1 % ophthalmic solution Place 1 drop into the right eye daily. 06/09/22   [provider]  cephALEXin (KEFLEX) 500 MG capsule Take 2 capsules (1,000 mg total) by mouth every 6 (six) hours. 01/30/23   Jonah Blue, MD  chlorhexidine (PERIDEX) 0.12 % solution Use as directed 15 mLs in the mouth or throat 2 (two) times daily.    [provider]  Cholecalciferol (VITAMIN D-3) 25 MCG (1000 UT) CAPS Take 2,000 Units by mouth daily.    [provider]  dorzolamide-timolol (COSOPT) 22.3-6.8 MG/ML ophthalmic solution Place 1 drop into both eyes 2 (two) times daily.    [provider]  Dulaglutide (TRULICITY) 3 MG/0.5ML SOPN Inject 3 mg into the skin every Monday.    [provider]  ferrous gluconate (FERGON) 324 MG  tablet Take 324 mg by mouth at bedtime.    [provider]  furosemide (LASIX) 20 MG tablet Take 20 mg by mouth daily. 11/17/22   [provider]  gabapentin (NEURONTIN) 100 MG capsule Take 2 capsules (200 mg total) by mouth at bedtime as needed. Patient taking differently: Take 200 mg by mouth at bedtime. 08/07/20   Leeroy Bock, MD  hydrocortisone cream 1 % Apply topically 2 (two) times daily. 01/30/23   Jonah Blue, MD  ipratropium (ATROVENT) 0.02 % nebulizer solution Take 0.5 mg by nebulization every 6 (six) hours as needed for wheezing or shortness of breath.    [provider]  latanoprost (XALATAN) 0.005 % ophthalmic solution Place 1  drop into the left eye at bedtime. 01/03/17   [provider]  magnesium oxide (MAG-OX) 400 MG tablet Take 400 mg by mouth daily.    [provider]  Menthol, Topical Analgesic, (BIOFREEZE) 4 % GEL Apply 1 application  topically in the morning and at bedtime. To neck and shoulder    [provider]  metoprolol succinate (TOPROL XL) 25 MG 24 hr tablet Take 0.5 tablets (12.5 mg total) by mouth daily. 12/02/22 12/02/23  Ghimire, Werner Lean, MD  nitroGLYCERIN (NITROSTAT) 0.4 MG SL tablet Place 0.4 mg under the tongue every 5 (five) minutes x 3 doses as needed for chest pain (and CALL PROVIDER IF NO RELIEF AFTER THE 3RD DOSE).    [provider]  Nystatin (GERHARDT'S BUTT CREAM) CREA Apply 1 Application topically 3 (three) times daily. 01/30/23   Jonah Blue, MD  OXYGEN Inhale 2 L into the lungs at bedtime.    [provider]  pantoprazole (PROTONIX) 40 MG tablet Take 1 tablet (40 mg total) by mouth 2 (two) times daily. 12/02/22   Ghimire, Werner Lean, MD  Polyethyl Glyc-Propyl Glyc PF (SYSTANE PRESERVATIVE FREE) 0.4-0.3 % SOLN Place 1 drop into both eyes 3 (three) times daily.    [provider]  polyethylene glycol (MIRALAX / GLYCOLAX) 17 g packet Take 17 g by mouth daily.    [provider]  prednisoLONE acetate (PRED FORTE) 1 % ophthalmic suspension Place 1 drop into the right eye 2 (two) times daily. 06/09/22   [provider]  sennosides-docusate sodium (SENOKOT-S) 8.6-50 MG tablet Take 2 tablets by mouth 2 (two) times daily.    [provider]  sertraline (ZOLOFT) 25 MG tablet Take 25 mg by mouth daily. 11/14/22   [provider]  SPIRIVA RESPIMAT 2.5 MCG/ACT AERS Inhale 1 each into the lungs daily. 11/22/22   [provider]  tamsulosin (FLOMAX) 0.4 MG CAPS capsule Take 1 capsule (0.4 mg total) by mouth daily after supper. 12/25/18   Darlin Drop, DO  VASCEPA 1 g capsule Take 1 g by mouth every evening.     [provider]  vitamin B-12 (CYANOCOBALAMIN) 1000 MCG tablet Take 1,000 mcg by mouth daily.    [provider]  Zinc Oxide (DESITIN) 40 % PSTE Apply 1 Application topically See admin instructions. Apply topically to sacrum and buttocks every shift and as needed for skin protection.    [provider]      Allergies    Adhesive [tape]    Review of Systems   Review of Systems  Physical Exam Updated Vital Signs BP (!) 182/84 (BP Location: Right Arm)   Pulse 67   Temp (!) 97.4 F (36.3 C) (Rectal)   Resp 20   Ht 5\' 11"  (  1.803 m)   Wt 118.2 kg   SpO2 97%   BMI 36.33 kg/m  Physical Exam  ED Results / Procedures / Treatments   Labs (all labs ordered are listed, but only abnormal results are displayed) Labs Reviewed - No data to display  EKG None  Radiology No results found.  Procedures Procedures  {Document cardiac monitor, telemetry assessment procedure when appropriate:1}  Medications Ordered in ED Medications - No data to display  ED Course/ Medical Decision Making/ A&P   {   Click here for ABCD2, HEART and other calculatorsREFRESH Note before signing :1}                              Medical Decision Making Amount and/or Complexity of Data Reviewed Labs: ordered. Radiology: ordered.  Risk Prescription drug management.   ***  {Document critical care time when appropriate:1} {Document review of labs and clinical decision tools ie heart score, Chads2Vasc2 etc:1}  {Document your independent review of radiology images, and any outside records:1} {Document your discussion with family members, caretakers, and with consultants:1} {Document social determinants of health affecting pt's care:1} {Document your decision making why or why not admission, treatments were needed:1} Final Clinical Impression(s) / ED Diagnoses Final diagnoses:  None    Rx / DC Orders ED Discharge Orders     None

## 2023-04-17 ENCOUNTER — Inpatient Hospital Stay (HOSPITAL_COMMUNITY): Payer: Medicare Other

## 2023-04-17 DIAGNOSIS — I615 Nontraumatic intracerebral hemorrhage, intraventricular: Secondary | ICD-10-CM

## 2023-04-17 DIAGNOSIS — I6389 Other cerebral infarction: Secondary | ICD-10-CM | POA: Diagnosis not present

## 2023-04-17 LAB — ECHOCARDIOGRAM COMPLETE
AR max vel: 2.44 cm2
AV Peak grad: 8.3 mm[Hg]
Ao pk vel: 1.44 m/s
Area-P 1/2: 3.45 cm2
Height: 71 in
S' Lateral: 3.05 cm
Weight: 4168 [oz_av]

## 2023-04-17 LAB — GLUCOSE, CAPILLARY
Glucose-Capillary: 149 mg/dL — ABNORMAL HIGH (ref 70–99)
Glucose-Capillary: 157 mg/dL — ABNORMAL HIGH (ref 70–99)

## 2023-04-17 LAB — LIPID PANEL
Cholesterol: 115 mg/dL (ref 0–200)
HDL: 33 mg/dL — ABNORMAL LOW (ref >40)
LDL Cholesterol: 65 mg/dL (ref 0–99)
Total CHOL/HDL Ratio: 3.5 {ratio}
Triglycerides: 86 mg/dL (ref <150)
VLDL: 17 mg/dL (ref 0–40)

## 2023-04-17 LAB — HEMOGLOBIN A1C
Hgb A1c MFr Bld: 5.2 % (ref 4.8–5.6)
Mean Plasma Glucose: 102.54 mg/dL

## 2023-04-17 LAB — MRSA NEXT GEN BY PCR, NASAL: MRSA by PCR Next Gen: NOT DETECTED

## 2023-04-17 MED ORDER — MORPHINE SULFATE (PF) 2 MG/ML IV SOLN
2.0000 mg | INTRAVENOUS | Status: DC | PRN
  Administered 2023-04-17: 2 mg via INTRAVENOUS

## 2023-04-17 MED ORDER — GLYCOPYRROLATE 0.2 MG/ML IJ SOLN
0.2000 mg | INTRAMUSCULAR | Status: DC
  Administered 2023-04-17: 0.2 mg via INTRAVENOUS

## 2023-04-17 MED ORDER — LORAZEPAM BOLUS VIA INFUSION: 1.0000 mg | INTRAVENOUS | Status: DC | PRN

## 2023-04-17 MED ORDER — MORPHINE 100MG IN NS 100ML (1MG/ML) PREMIX INFUSION
5.0000 mg/h | INTRAVENOUS | Status: DC
  Administered 2023-04-17: 5 mg/h via INTRAVENOUS
  Filled 2023-04-17 (×2): qty 100

## 2023-04-17 MED ORDER — CHLORHEXIDINE GLUCONATE CLOTH 2 % EX PADS: 6.0000 | MEDICATED_PAD | Freq: Every day | CUTANEOUS | Status: DC

## 2023-04-17 MED ORDER — GLYCOPYRROLATE 0.2 MG/ML IJ SOLN
INTRAMUSCULAR | Status: AC
  Filled 2023-04-17: qty 1

## 2023-05-12 NOTE — Progress Notes (Signed)
eLink Physician-Brief Progress Note Patient Name: [name] DOB: 1936-06-12 MRN: [id number]   Date of Service  05/03/2023  HPI/Events of Note  87/M with history of CHF, COPD, prior strokes, PAD (on anticoagulation), presenting with lethargy. CT head showed L basal ganglia bleed with possible intraventricular extension.  Pt given Andexxa, started on cleviprex and admitted to the ICU.   eICU Interventions  ICH - Serial neurochecks per protocol - BP control - target SBP <141mmHg - Anticoagulation held. Given Andexxa for reversal.  - Not a candidate for intervention.  - Maintain NPO for now.  - Code status: DNR/DNI - Overall prognosis poor.         Nimesh Riolo M DELA CRUZ 04/14/2023, 1:37 AM

## 2023-05-12 NOTE — Progress Notes (Signed)
OT Cancellation Note  Patient Details Name: Reginald [name] MRN: [id number] DOB: [birth year]   Cancelled Treatment:    Reason Eval/Treat Not Completed: Active bedrest order;Patient's level of consciousness. Pt without current response and not currently appropriate for therapy. Will sign off and await new order   Emelda Fear 04/27/2023, 7:57 AM  Nyoka Cowden OTR/L Acute Rehabilitation Services Office: [phone]

## 2023-05-12 NOTE — Progress Notes (Signed)
Echocardiogram 2D Echocardiogram has been performed.  Reginald [name] 05/07/2023, 1:02 PM

## 2023-05-12 NOTE — Progress Notes (Addendum)
STROKE TEAM PROGRESS NOTE   BRIEF HPI Reginald [name] is a 87 y.o. 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  CT head revealed a large ICH in the left basal ganglia with possible intraventricular extension. Andexxa given in the ED. Admitted for BP management. Patient is a DNR/DNI, has been confirmed with his family by the EDP.  Patient is at poor baseline and was not a candidate for any intervention. Neurosurgery not consulted.   SIGNIFICANT HOSPITAL EVENTS 10/7 early AM: admitted to ICU for BP mgmt and grequent neuro checks.  10/7: Repeat CT this AM  worsened hemorrhage (doubled in size) with now acute hydrocephalus, uncal herniation and 2cm rightward midline shift.   INTERIM HISTORY/SUBJECTIVE Son, Reginald [name], and RN at bedside. Team updated son on assessment and poor prognosis, including repeat CT results. After discussion with Dr. Viviann Spare regarding repeat CT results, assessment, poor prognosis, patients previous wishes that were discussed, patient was transitioned to comfort care. Now on morphine gtt.    OBJECTIVE  CBC    Component Value Date/Time   WBC 9.9 2023/05/13 2113   RBC 5.10 05-13-2023 2113   HGB 14.3 2023-05-13 2119   HCT 42.0 05-13-23 2119   PLT 302 05/13/2023 2113   MCV 85.5 2023/05/13 2113   MCH 24.9 (L) May 13, 2023 2113   MCHC 29.1 (L) May 13, 2023 2113   RDW 16.1 (H) 05/13/2023 2113   LYMPHSABS 1.5 2023-05-13 2113   MONOABS 0.8 2023/05/13 2113   EOSABS 0.5 05-13-23 2113   BASOSABS 0.1 05/13/2023 2113    BMET    Component Value Date/Time   NA 144 May 13, 2023 2119   K 3.3 (L) 13-May-2023 2119   CL 101 05/13/23 2119   CO2 30  2023-05-13 2113   GLUCOSE 118 (H) 13-May-2023 2119   BUN 18 05/13/2023 2119   CREATININE 1.20 2023-05-13 2119   CALCIUM 9.3 May 13, 2023 2113   GFRNONAA >60 May 13, 2023 2113    IMAGING past 24 hours CT HEAD CODE STROKE WO CONTRAST  Result Date: 05/13/2023 CLINICAL DATA:  Code stroke. Initial evaluation for neuro deficit, stroke suspected. EXAM: CT HEAD WITHOUT CONTRAST TECHNIQUE: Contiguous axial images were obtained from the base of the skull through the vertex without intravenous contrast. RADIATION DOSE REDUCTION: This exam was performed according to the departmental dose-optimization program which includes automated exposure control, adjustment of the mA and/or kV according to patient size and/or use of iterative reconstruction technique. COMPARISON:  Prior study from 11/27/2022. FINDINGS: Brain: Age-related cerebral atrophy with chronic microvascular ischemic disease. Acute intraparenchymal hemorrhage centered at the left thalamus measures 3.0 x 2.9 x 3.9 cm (estimated volume 17 mL). Surrounding edema and regional mass effect with up to 7 mm of left-to-right shift. Intraventricular extension with blood in the adjacent left lateral ventricle. No overt hydrocephalus at this time. No other acute intracranial hemorrhage or large vessel territory infarct. No mass lesion. No extra-axial fluid collection. Vascular: No abnormal hyperdense vessel. Calcified atherosclerosis present at the skull base. Skull: Scalp soft tissues within normal limits.  Calvarium intact. Sinuses/Orbits: Globes and orbital soft tissues within normal limits. Mild mucosal thickening with pneumatized secretions noted about the sphenoid ethmoidal sinuses. Paranasal sinuses are otherwise largely clear. Trace left mastoid effusion noted. Other: None. ASPECTS [facility] Stroke Program  Early CT Score) Acute ICH, does not apply. IMPRESSION: 1. Acute intraparenchymal hemorrhage centered at the left thalamus measuring 3.0 x 2.9 x 3.9 cm (estimated  volume 17 mL). Surrounding edema and regional mass effect with up to 7 mm of left-to-right shift. Intraventricular extension with blood in the adjacent left lateral ventricle. No overt hydrocephalus at this time. 2. Underlying atrophy with chronic small vessel ischemic disease. Critical Value/emergent results were called by telephone at the time of interpretation on 04/28/2023 at 9:43 pm to provider Doreatha Martin, who verbally acknowledged these results. Electronically Signed   By: Rise Mu M.D.   On: 05/06/2023 21:46    Vitals:   13-May-2023 0715 05-13-23 0730 May 13, 2023 0745 05/13/2023 0800  BP: (!) 158/63 (!) 166/66 (!) 137/59 (!) 150/59  Pulse: 68 85 72 78  Resp: 15 15 15 15   Temp:      TempSrc:      SpO2: 94% 93% 93% 93%  Weight:      Height:         PHYSICAL EXAM General: elderly, critically-ill patient Psych: obtunded CV: Regular rate and rhythm on monitor Respiratory:  mechanically ventilated GI: Abdomen soft and nontender   NEURO:  Mental Status: Obtunded, no sedation. Patient does not open eyes to voice or noxious stimuli, does not follow commands, no withdrawal to noxious stimuli.  Weak cough, No gag, very weak corneals. Pupils are 4-14mm, asymmetric and non-reactive.   ASSESSMENT/PLAN  Intracerebral Hemorrhage:  left subcortical, non-traumatic  Etiology:   Coagulopathy due to Eliquis and aspirin, hypertension  Code Stroke CT head   Acute intraparenchymal hemorrhage centered at the left thalamus measuring 3.0 x 2.9 x 3.9 cm (estimated volume 17 mL).  Surrounding edema and regional mass effect with up to 7 mm of left-to-right shift Intraventricular extension with blood in the adjacent left lateral ventricle.  No overt hydrocephalus at this time Repeat CT Head:  Marked interval increase in size of the left thalamic hemorrhage,which now measures up to 6.6 x 6.2 x 5.3 cm, previously 3.0 x 2 9 x 3.9 cm. There is now evidence of acute hydrocephalus, uncal herniation,  and 2 cm rightward midline shift. New hemorrhage involving the bilateral cerebral peduncles extending inferiorly to involve the pons.  New right SCA territory infarct involving the right cerebellum  2D Echo  ECHO 01/2023: EF 55-60%, Moderate LVH, Moderately dilated Left atria, Mild MVR, Borderline diltation of ascending aorta.  LDL 65 HgbA1c 5.2 VTE prophylaxis - SCDS Eliquis (apixaban) daily prior to admission, now on No antithrombotic due to ICH.  Therapy recommendations:  Pending Disposition:  pending  Comfort Care Measures Discussed with sons Reginald [name] (at bedside) and Vernia Buff (phone) this am Vernia Buff is HCPOA Declined palliative and chaplain consults at this time.  Morphine gtt  Hypertension Home meds:  Toprol-XL Unstable Cleviprex gtt, titrate to goal Blood Pressure Goal: SBP between 130-150 for 24 hours and then less than 160   Hyperlipidemia Home meds:  lipitor 20mg , not resumed in hospital d/t ICH LDL 65, goal < 70 Consider restarting statin at discharge  Dysphagia Patient has post-stroke dysphagia, SLP consulted    Diet   Diet NPO time specified   Advance diet as tolerated   Hospital day # 1  ATTENDING ATTESTATION:  87 year old with large left basal ganglia hemorrhage with intraventricular extension.  Repeat head CT this morning shows enlarging hematoma expansion.  This is a devastating stroke that is nonsurvivable.  Discussed this with the patient's 2 sons-1 is a power of  Early CT Score) Acute ICH, does not apply. IMPRESSION: 1. Acute intraparenchymal hemorrhage centered at the left thalamus measuring 3.0 x 2.9 x 3.9 cm (estimated  volume 17 mL). Surrounding edema and regional mass effect with up to 7 mm of left-to-right shift. Intraventricular extension with blood in the adjacent left lateral ventricle. No overt hydrocephalus at this time. 2. Underlying atrophy with chronic small vessel ischemic disease. Critical Value/emergent results were called by telephone at the time of interpretation on 04/28/2023 at 9:43 pm to provider Doreatha Martin, who verbally acknowledged these results. Electronically Signed   By: Rise Mu M.D.   On: 05/06/2023 21:46    Vitals:   13-May-2023 0715 05-13-23 0730 May 13, 2023 0745 05/13/2023 0800  BP: (!) 158/63 (!) 166/66 (!) 137/59 (!) 150/59  Pulse: 68 85 72 78  Resp: 15 15 15 15   Temp:      TempSrc:      SpO2: 94% 93% 93% 93%  Weight:      Height:         PHYSICAL EXAM General: elderly, critically-ill patient Psych: obtunded CV: Regular rate and rhythm on monitor Respiratory:  mechanically ventilated GI: Abdomen soft and nontender   NEURO:  Mental Status: Obtunded, no sedation. Patient does not open eyes to voice or noxious stimuli, does not follow commands, no withdrawal to noxious stimuli.  Weak cough, No gag, very weak corneals. Pupils are 4-14mm, asymmetric and non-reactive.   ASSESSMENT/PLAN  Intracerebral Hemorrhage:  left subcortical, non-traumatic  Etiology:   Coagulopathy due to Eliquis and aspirin, hypertension  Code Stroke CT head   Acute intraparenchymal hemorrhage centered at the left thalamus measuring 3.0 x 2.9 x 3.9 cm (estimated volume 17 mL).  Surrounding edema and regional mass effect with up to 7 mm of left-to-right shift Intraventricular extension with blood in the adjacent left lateral ventricle.  No overt hydrocephalus at this time Repeat CT Head:  Marked interval increase in size of the left thalamic hemorrhage,which now measures up to 6.6 x 6.2 x 5.3 cm, previously 3.0 x 2 9 x 3.9 cm. There is now evidence of acute hydrocephalus, uncal herniation,  and 2 cm rightward midline shift. New hemorrhage involving the bilateral cerebral peduncles extending inferiorly to involve the pons.  New right SCA territory infarct involving the right cerebellum  2D Echo  ECHO 01/2023: EF 55-60%, Moderate LVH, Moderately dilated Left atria, Mild MVR, Borderline diltation of ascending aorta.  LDL 65 HgbA1c 5.2 VTE prophylaxis - SCDS Eliquis (apixaban) daily prior to admission, now on No antithrombotic due to ICH.  Therapy recommendations:  Pending Disposition:  pending  Comfort Care Measures Discussed with sons Reginald [name] (at bedside) and Vernia Buff (phone) this am Vernia Buff is HCPOA Declined palliative and chaplain consults at this time.  Morphine gtt  Hypertension Home meds:  Toprol-XL Unstable Cleviprex gtt, titrate to goal Blood Pressure Goal: SBP between 130-150 for 24 hours and then less than 160   Hyperlipidemia Home meds:  lipitor 20mg , not resumed in hospital d/t ICH LDL 65, goal < 70 Consider restarting statin at discharge  Dysphagia Patient has post-stroke dysphagia, SLP consulted    Diet   Diet NPO time specified   Advance diet as tolerated   Hospital day # 1  ATTENDING ATTESTATION:  87 year old with large left basal ganglia hemorrhage with intraventricular extension.  Repeat head CT this morning shows enlarging hematoma expansion.  This is a devastating stroke that is nonsurvivable.  Discussed this with the patient's 2 sons-1 is a power of

## 2023-05-12 NOTE — Death Summary Note (Addendum)
DEATH SUMMARY   Patient Details  Name: Reginald [name] MRN: [id number] DOB: [birth year] NWG:NFAOZ, Denny Levy, MD  Admission/Discharge Information   Admit Date:  Apr 29, 2023  Date of Death: Date of Death: April 30, 2023  Time of Death: Time of Death: 1934  Length of Stay: 1   Principle Cause of death: Intracerebral Hemorrhage  Hospital Diagnoses: Principal Problem:   ICH (intracerebral hemorrhage) (HCC) Active Problems:   Hemorrhagic stroke [facility])   Hospital Course: CT head revealed a large ICH in the left basal ganglia with possible intraventricular extension. Andexxa given in the ED. Patient is a DNR/DNI, has been confirmed with his family by the EDP.  Repeat CT:  worsened hemorrhage (doubled in size) with now acute hydrocephalus, uncal herniation and 2cm rightward midline shift.  Patient transferred to comfort care and placed on morphine gtt.  Assessment and Plan: Intracerebral Hemorrhage:  left subcortical, non-traumatic  Etiology:   Coagulopathy due to Eliquis and aspirin, hypertension  Code Stroke CT head   Acute intraparenchymal hemorrhage centered at the left thalamus measuring 3.0 x 2.9 x 3.9 cm (estimated volume 17 mL).  Surrounding edema and regional mass effect with up to 7 mm of left-to-right shift Intraventricular extension with blood in the adjacent left lateral ventricle.  No overt hydrocephalus at this time Repeat CT Head:  Marked interval increase in size of the left thalamic hemorrhage,which now measures up to 6.6 x 6.2 x 5.3 cm, previously 3.0 x 2 9 x 3.9 cm. There is now evidence of acute hydrocephalus, uncal herniation, and 2 cm rightward midline shift. New hemorrhage involving the bilateral cerebral peduncles extending inferiorly to involve the pons.  New right SCA territory infarct involving the right cerebellum   2D Echo  ECHO 01/2023: EF 55-60%, Moderate LVH, Moderately dilated Left atria, Mild MVR, Borderline diltation of ascending aorta.  LDL 65 HgbA1c  5.2 VTE prophylaxis - SCDS Eliquis (apixaban) daily prior to admission, now on No antithrombotic due to ICH.   Comfort Care Measures Discussed with sons [name] (at bedside) and Vernia Buff (phone) this am Vernia Buff is HCPOA Declined palliative and chaplain consults at this time.  Morphine gtt      The results of significant diagnostics from this hospitalization (including imaging, microbiology, ancillary and laboratory) are listed below for reference.   Significant Diagnostic Studies: ECHOCARDIOGRAM COMPLETE  Result Date: 2023/04/30    ECHOCARDIOGRAM REPORT   Patient Name:   Reginald [name] [facility] Date of Exam: 04-30-23 Medical Rec #:  [id number]        Height:       71.0 in Accession #:    [id number]       Weight:       260.5 lb Date of Birth:  [birth year]        BSA:          2.359 m Patient Age:    87 years         BP:           141/66 mmHg Patient Gender: M                HR:           74 bpm. Exam Location:  Inpatient Procedure: 2D Echo, Cardiac Doppler and Color Doppler Indications:    Stroke I63.9  History:        Patient has prior history of Echocardiogram examinations, most                 recent 01/26/2023. CHF,  CAD, Arrythmias:Atrial Fibrillation,                 Signs/Symptoms:Chest Pain; Risk Factors:Hypertension, Sleep                 Apnea and Dyslipidemia. CKD, stage 4.  Sonographer:    Lucendia Herrlich RCS Referring Phys: [id number] ASHISH ARORA IMPRESSIONS  1. Left ventricular ejection fraction, by estimation, is 60 to 65%. The left ventricle has normal function. The left ventricle has no regional wall motion abnormalities. There is mild concentric left ventricular hypertrophy. Left ventricular diastolic parameters are indeterminate. Elevated left ventricular end-diastolic pressure.  2. Right ventricular systolic function is normal. The right ventricular size is normal. There is mildly elevated pulmonary artery systolic pressure.  3. Left atrial size was severely dilated.  4. There is no evidence of  cardiac tamponade.  5. The mitral valve is normal in structure. Trivial mitral valve regurgitation. No evidence of mitral stenosis.  6. The aortic valve is tricuspid. Aortic valve regurgitation is not visualized. No aortic stenosis is present.  7. The inferior vena cava is dilated in size with >50% respiratory variability, suggesting right atrial pressure of 8 mmHg. FINDINGS  Left Ventricle: Left ventricular ejection fraction, by estimation, is 60 to 65%. The left ventricle has normal function. The left ventricle has no regional wall motion abnormalities. The left ventricular internal cavity size was normal in size. There is  mild concentric left ventricular hypertrophy. Left ventricular diastolic parameters are indeterminate. Elevated left ventricular end-diastolic pressure. Right Ventricle: The right ventricular size is normal. No increase in right ventricular wall thickness. Right ventricular systolic function is normal. There is mildly elevated pulmonary artery systolic pressure. The tricuspid regurgitant velocity is 2.93  m/s, and with an assumed right atrial pressure of 8 mmHg, the estimated right ventricular systolic pressure is 42.3 mmHg. Left Atrium: Left atrial size was severely dilated. Right Atrium: Right atrial size was normal in size. Pericardium: Trivial pericardial effusion is present. There is no evidence of cardiac tamponade. Mitral Valve: The mitral valve is normal in structure. Mild mitral annular calcification. Trivial mitral valve regurgitation. No evidence of mitral valve stenosis. Tricuspid Valve: The tricuspid valve is normal in structure. Tricuspid valve regurgitation is trivial. No evidence of tricuspid stenosis. Aortic Valve: The aortic valve is tricuspid. Aortic valve regurgitation is not visualized. No aortic stenosis is present. Aortic valve peak gradient measures 8.3 mmHg. Pulmonic Valve: The pulmonic valve was normal in structure. Pulmonic valve regurgitation is trivial. No evidence  of pulmonic stenosis. Aorta: The aortic root is normal in size and structure. Venous: The inferior vena cava is dilated in size with greater than 50% respiratory variability, suggesting right atrial pressure of 8 mmHg. IAS/Shunts: No atrial level shunt detected by color flow Doppler.  LEFT VENTRICLE PLAX 2D LVIDd:         4.25 cm   Diastology LVIDs:         3.05 cm   LV e' medial:    6.31 cm/s LV PW:         1.15 cm   LV E/e' medial:  17.1 LV IVS:        1.15 cm   LV e' lateral:   7.89 cm/s LVOT diam:     2.20 cm   LV E/e' lateral: 13.7 LV SV:         65 LV SV Index:   27 LVOT Area:     3.80 cm  RIGHT VENTRICLE  IVC RV S prime:     14.20 cm/s  IVC diam: 2.20 cm TAPSE (M-mode): 1.2 cm LEFT ATRIUM             Index        RIGHT ATRIUM           Index LA diam:        4.95 cm 2.10 cm/m   RA Area:     19.30 cm LA Vol (A2C):   86.0 ml 36.45 ml/m  RA Volume:   45.90 ml  19.46 ml/m LA Vol (A4C):   98.6 ml 41.81 ml/m LA Biplane Vol: 96.6 ml 40.95 ml/m  AORTIC VALVE AV Area (Vmax): 2.44 cm AV Vmax:        144.00 cm/s AV Peak Grad:   8.3 mmHg LVOT Vmax:      92.56 cm/s LVOT Vmean:     57.460 cm/s LVOT VTI:       0.170 m  AORTA Ao Root diam: 3.70 cm Ao Asc diam:  3.70 cm MITRAL VALVE                TRICUSPID VALVE MV Area (PHT): 3.45 cm     TR Peak grad:   34.3 mmHg MV Decel Time: 220 msec     TR Vmax:        293.00 cm/s MV E velocity: 108.00 cm/s MV A velocity: 32.10 cm/s   SHUNTS MV E/A ratio:  3.36         Systemic VTI:  0.17 m                             Systemic Diam: 2.20 cm Chilton Si MD Electronically signed by Chilton Si MD Signature Date/Time: 05/05/2023/1:44:50 PM    Final    CT HEAD WO CONTRAST  Result Date: 04/15/2023 CLINICAL DATA:  Stroke, hemorrhagic EXAM: CT HEAD WITHOUT CONTRAST TECHNIQUE: Contiguous axial images were obtained from the base of the skull through the vertex without intravenous contrast. RADIATION DOSE REDUCTION: This exam was performed according to the  departmental dose-optimization program which includes automated exposure control, adjustment of the mA and/or kV according to patient size and/or use of iterative reconstruction technique. COMPARISON:  CT Head 05/14/23 FINDINGS: Brain: Marked interval increase in size of the left basal ganglia parenchymal hemorrhage, which now measures up to 6.6 x 6.2 x 5.3 cm, previously 3.0 x 2 9 x 3.9 cm. There are now appears to be hemorrhage involving the bilateral cerebral peduncles extending inferiorly to involve the pons. It is unclear if the blood products in the pons represent an area of the right hemorrhage. There is a new right SCA territory infarct involving the right cerebellum. Compared to prior exam there are increased intraventricular blood products with new hydrocephalus. There is evidence of subfalcine and uncal herniation. There are complication of uncal herniation including Vascular: No hyperdense vessel or unexpected calcification. Skull: Normal. Negative for fracture or focal lesion. Sinuses/Orbits: Small left mastoid effusion. No middle ear effusion. Frothy secretions in the left sphenoid sinus. Bilateral orbits are unremarkable. Other: None. IMPRESSION: 1. Marked interval increase in size of the left thalamic hemorrhage, which now measures up to 6.6 x 6.2 x 5.3 cm, previously 3.0 x 2 9 x 3.9 cm. There is now evidence of acute hydrocephalus, uncal herniation, and 2 cm rightward midline shift. 2. New hemorrhage involving the bilateral cerebral peduncles extending inferiorly to involve the pons. It is unclear  if the areas of pontine hemorrhage represent Duret hemorrhage. 3. New right SCA territory infarct involving the right cerebellum. Electronically Signed   By: Lorenza Cambridge M.D.   On: 04/11/2023 11:08   CT HEAD CODE STROKE WO CONTRAST  Result Date: 29-Apr-2023 CLINICAL DATA:  Code stroke. Initial evaluation for neuro deficit, stroke suspected. EXAM: CT HEAD WITHOUT CONTRAST TECHNIQUE: Contiguous axial  images were obtained from the base of the skull through the vertex without intravenous contrast. RADIATION DOSE REDUCTION: This exam was performed according to the departmental dose-optimization program which includes automated exposure control, adjustment of the mA and/or kV according to patient size and/or use of iterative reconstruction technique. COMPARISON:  Prior study from 11/27/2022. FINDINGS: Brain: Age-related cerebral atrophy with chronic microvascular ischemic disease. Acute intraparenchymal hemorrhage centered at the left thalamus measures 3.0 x 2.9 x 3.9 cm (estimated volume 17 mL). Surrounding edema and regional mass effect with up to 7 mm of left-to-right shift. Intraventricular extension with blood in the adjacent left lateral ventricle. No overt hydrocephalus at this time. No other acute intracranial hemorrhage or large vessel territory infarct. No mass lesion. No extra-axial fluid collection. Vascular: No abnormal hyperdense vessel. Calcified atherosclerosis present at the skull base. Skull: Scalp soft tissues within normal limits.  Calvarium intact. Sinuses/Orbits: Globes and orbital soft tissues within normal limits. Mild mucosal thickening with pneumatized secretions noted about the sphenoid ethmoidal sinuses. Paranasal sinuses are otherwise largely clear. Trace left mastoid effusion noted. Other: None. ASPECTS [facility] Stroke Program Early CT Score) Acute ICH, does not apply. IMPRESSION: 1. Acute intraparenchymal hemorrhage centered at the left thalamus measuring 3.0 x 2.9 x 3.9 cm (estimated volume 17 mL). Surrounding edema and regional mass effect with up to 7 mm of left-to-right shift. Intraventricular extension with blood in the adjacent left lateral ventricle. No overt hydrocephalus at this time. 2. Underlying atrophy with chronic small vessel ischemic disease. Critical Value/emergent results were called by telephone at the time of interpretation on April 29, 2023 at 9:43 pm to provider  Doreatha Martin, who verbally acknowledged these results. Electronically Signed   By: Rise Mu M.D.   On: 29-Apr-2023 21:46    Microbiology: Recent Results (from the past 240 hour(s))  MRSA Next Gen by PCR, Nasal     Status: None   Collection Time: 04/20/2023  1:14 AM   Specimen: Nasal Mucosa; Nasal Swab  Result Value Ref Range Status   MRSA by PCR Next Gen NOT DETECTED NOT DETECTED Final    Comment: (NOTE) The GeneXpert MRSA Assay (FDA approved for NASAL specimens only), is one component of a comprehensive MRSA colonization surveillance program. It is not intended to diagnose MRSA infection nor to guide or monitor treatment for MRSA infections. Test performance is not FDA approved in patients less than 35 years old. Performed at [facility], [address], [place], [place] [address]     Time spent: 20 minutes  Signed: Lynnae January, NP 05/11/2023  ATTENDING ATTESTATION:  See note from earlier in the day.   Dr. Viviann Spare evaluated pt independently, reviewed imaging, chart, labs. Discussed and formulated plan with the Resident/APP. Changes were made to the note where appropriate. Please see APP/resident note above for details.      Olukemi Panchal,MD

## 2023-05-12 NOTE — Progress Notes (Signed)
Patient went asystole at 9. This RN and Cecil Cobbs RN verified time of death.

## 2023-05-12 NOTE — Progress Notes (Signed)
Attempted Echocardiogram, patient went to CT.

## 2023-05-12 NOTE — H&P (Signed)
results pertinent to this  consultation are:  CBC    Component Value Date/Time   WBC 9.9 04/22/2023 2113   RBC 5.10 05/03/2023 2113   HGB 14.3 04/27/2023 2119   HCT 42.0 04/30/2023 2119   PLT 302 04/20/2023 2113   MCV 85.5 04/21/2023 2113   MCH 24.9 (L) 04/13/2023 2113   MCHC 29.1 (L) 05/05/2023 2113   RDW 16.1 (H) 04/24/2023 2113   LYMPHSABS 1.5 05/04/2023 2113   MONOABS 0.8 04/12/2023 2113   EOSABS 0.5 04/14/2023 2113   BASOSABS 0.1 04/11/2023 2113    CMP     Component Value Date/Time   NA 144 04/25/2023 2119   K 3.3 (L) 04/27/2023 2119   CL 101 05/08/2023 2119   CO2 30 04/11/2023 2113   GLUCOSE 118 (H) 04/30/2023 2119   BUN 18 04/15/2023 2119   CREATININE 1.20 04/24/2023 2119   CALCIUM 9.3 04/18/2023 2113   PROT 6.5 05/06/2023 2113   ALBUMIN 3.3 (L) 04/14/2023 2113   AST 19 04/30/2023 2113   ALT 17 04/15/2023 2113   ALKPHOS 87 04/21/2023 2113   BILITOT 0.6 04/20/2023 2113   GFRNONAA >60 05/10/2023 2113   GFRAA 34 (L) 12/25/2018 0627    Lipid Panel     Component Value Date/Time   CHOL 115 05/13/2023 0019   TRIG 86 May 13, 2023 0019   HDL 33 (L) 05/13/23 0019   CHOLHDL 3.5 2023-05-13 0019   VLDL 17 2023/05/13 0019   LDLCALC 65 05-13-2023 0019     Imaging I have reviewed the images obtained:  CT-head-acute intraparenchymal hemorrhage centered in the left thalamus estimated 17 cc with surrounding edema and regional mass effect with up to 7 mm left-to-right shift.  Intraventricular extension with blood in the adjacent left lateral ventricle.  No overt hydrocephalus.  Underlying atrophy with chronic small vessel ischemic disease.  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 Being admitted for blood pressure management as the family gets around for further decision-making.  No aggressive surgery desired.  Patient is a DNR/DNI that has been confirmed with the family by the EDP. Andexxa was given in the ER.   Plan: Subcortical ICH, nontraumatic IVH, nontraumatic  Acuity: Acute Laterality: Right thalamus Current suspected etiology: Coagulopathy due to Eliquis and aspirin, hypertension    Treatment: -Admit to ICU -ICH Score: 3 -ICH Volume: 17 cc -BP control goal SYS 130-150 -Poor baseline-not a good candidate for any kind of intervention.  Will not consult neurosurgery. -neuromonitoring  CNS Cerebral edema Compression of brain -Likely is not going to survive this with a good outcome.  Awaiting family arrival, would recommend comfort measures  Hemiplegia and hemiparesis following nontraumatic intracerebral hemorrhage affecting right dominant side  -Continue PT/OT/ST  RESP Possible aspiration pneumonia -DNR/DNI -Check chest x-ray -Antibiotics if any evidence of aspiration pneumonia  CV Hypertensive Encephalopathy Essential (primary) hypertension Hypertensive Emergency -Blood pressure goal as above -Cleviprex drip  Paroxysmal atrial -fibrillation -On Eliquis at home.  Reversed with Andexxa.  Continue to hold off of any anticoagulants at this time  GI/GU No acute issues -Gentle hydration  HEME Coagulopathy secondary to anticoagulation - Reversed with Andexxa   ENDO No active issues  Fluid/Electrolyte  results pertinent to this  consultation are:  CBC    Component Value Date/Time   WBC 9.9 04/22/2023 2113   RBC 5.10 05/03/2023 2113   HGB 14.3 04/27/2023 2119   HCT 42.0 04/30/2023 2119   PLT 302 04/20/2023 2113   MCV 85.5 04/21/2023 2113   MCH 24.9 (L) 04/13/2023 2113   MCHC 29.1 (L) 05/05/2023 2113   RDW 16.1 (H) 04/24/2023 2113   LYMPHSABS 1.5 05/04/2023 2113   MONOABS 0.8 04/12/2023 2113   EOSABS 0.5 04/14/2023 2113   BASOSABS 0.1 04/11/2023 2113    CMP     Component Value Date/Time   NA 144 04/25/2023 2119   K 3.3 (L) 04/27/2023 2119   CL 101 05/08/2023 2119   CO2 30 04/11/2023 2113   GLUCOSE 118 (H) 04/30/2023 2119   BUN 18 04/15/2023 2119   CREATININE 1.20 04/24/2023 2119   CALCIUM 9.3 04/18/2023 2113   PROT 6.5 05/06/2023 2113   ALBUMIN 3.3 (L) 04/14/2023 2113   AST 19 04/30/2023 2113   ALT 17 04/15/2023 2113   ALKPHOS 87 04/21/2023 2113   BILITOT 0.6 04/20/2023 2113   GFRNONAA >60 05/10/2023 2113   GFRAA 34 (L) 12/25/2018 0627    Lipid Panel     Component Value Date/Time   CHOL 115 05/13/2023 0019   TRIG 86 May 13, 2023 0019   HDL 33 (L) 05/13/23 0019   CHOLHDL 3.5 2023-05-13 0019   VLDL 17 2023/05/13 0019   LDLCALC 65 05-13-2023 0019     Imaging I have reviewed the images obtained:  CT-head-acute intraparenchymal hemorrhage centered in the left thalamus estimated 17 cc with surrounding edema and regional mass effect with up to 7 mm left-to-right shift.  Intraventricular extension with blood in the adjacent left lateral ventricle.  No overt hydrocephalus.  Underlying atrophy with chronic small vessel ischemic disease.  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 Being admitted for blood pressure management as the family gets around for further decision-making.  No aggressive surgery desired.  Patient is a DNR/DNI that has been confirmed with the family by the EDP. Andexxa was given in the ER.   Plan: Subcortical ICH, nontraumatic IVH, nontraumatic  Acuity: Acute Laterality: Right thalamus Current suspected etiology: Coagulopathy due to Eliquis and aspirin, hypertension    Treatment: -Admit to ICU -ICH Score: 3 -ICH Volume: 17 cc -BP control goal SYS 130-150 -Poor baseline-not a good candidate for any kind of intervention.  Will not consult neurosurgery. -neuromonitoring  CNS Cerebral edema Compression of brain -Likely is not going to survive this with a good outcome.  Awaiting family arrival, would recommend comfort measures  Hemiplegia and hemiparesis following nontraumatic intracerebral hemorrhage affecting right dominant side  -Continue PT/OT/ST  RESP Possible aspiration pneumonia -DNR/DNI -Check chest x-ray -Antibiotics if any evidence of aspiration pneumonia  CV Hypertensive Encephalopathy Essential (primary) hypertension Hypertensive Emergency -Blood pressure goal as above -Cleviprex drip  Paroxysmal atrial -fibrillation -On Eliquis at home.  Reversed with Andexxa.  Continue to hold off of any anticoagulants at this time  GI/GU No acute issues -Gentle hydration  HEME Coagulopathy secondary to anticoagulation - Reversed with Andexxa   ENDO No active issues  Fluid/Electrolyte  results pertinent to this  consultation are:  CBC    Component Value Date/Time   WBC 9.9 04/22/2023 2113   RBC 5.10 05/03/2023 2113   HGB 14.3 04/27/2023 2119   HCT 42.0 04/30/2023 2119   PLT 302 04/20/2023 2113   MCV 85.5 04/21/2023 2113   MCH 24.9 (L) 04/13/2023 2113   MCHC 29.1 (L) 05/05/2023 2113   RDW 16.1 (H) 04/24/2023 2113   LYMPHSABS 1.5 05/04/2023 2113   MONOABS 0.8 04/12/2023 2113   EOSABS 0.5 04/14/2023 2113   BASOSABS 0.1 04/11/2023 2113    CMP     Component Value Date/Time   NA 144 04/25/2023 2119   K 3.3 (L) 04/27/2023 2119   CL 101 05/08/2023 2119   CO2 30 04/11/2023 2113   GLUCOSE 118 (H) 04/30/2023 2119   BUN 18 04/15/2023 2119   CREATININE 1.20 04/24/2023 2119   CALCIUM 9.3 04/18/2023 2113   PROT 6.5 05/06/2023 2113   ALBUMIN 3.3 (L) 04/14/2023 2113   AST 19 04/30/2023 2113   ALT 17 04/15/2023 2113   ALKPHOS 87 04/21/2023 2113   BILITOT 0.6 04/20/2023 2113   GFRNONAA >60 05/10/2023 2113   GFRAA 34 (L) 12/25/2018 0627    Lipid Panel     Component Value Date/Time   CHOL 115 05/13/2023 0019   TRIG 86 May 13, 2023 0019   HDL 33 (L) 05/13/23 0019   CHOLHDL 3.5 2023-05-13 0019   VLDL 17 2023/05/13 0019   LDLCALC 65 05-13-2023 0019     Imaging I have reviewed the images obtained:  CT-head-acute intraparenchymal hemorrhage centered in the left thalamus estimated 17 cc with surrounding edema and regional mass effect with up to 7 mm left-to-right shift.  Intraventricular extension with blood in the adjacent left lateral ventricle.  No overt hydrocephalus.  Underlying atrophy with chronic small vessel ischemic disease.  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 Being admitted for blood pressure management as the family gets around for further decision-making.  No aggressive surgery desired.  Patient is a DNR/DNI that has been confirmed with the family by the EDP. Andexxa was given in the ER.   Plan: Subcortical ICH, nontraumatic IVH, nontraumatic  Acuity: Acute Laterality: Right thalamus Current suspected etiology: Coagulopathy due to Eliquis and aspirin, hypertension    Treatment: -Admit to ICU -ICH Score: 3 -ICH Volume: 17 cc -BP control goal SYS 130-150 -Poor baseline-not a good candidate for any kind of intervention.  Will not consult neurosurgery. -neuromonitoring  CNS Cerebral edema Compression of brain -Likely is not going to survive this with a good outcome.  Awaiting family arrival, would recommend comfort measures  Hemiplegia and hemiparesis following nontraumatic intracerebral hemorrhage affecting right dominant side  -Continue PT/OT/ST  RESP Possible aspiration pneumonia -DNR/DNI -Check chest x-ray -Antibiotics if any evidence of aspiration pneumonia  CV Hypertensive Encephalopathy Essential (primary) hypertension Hypertensive Emergency -Blood pressure goal as above -Cleviprex drip  Paroxysmal atrial -fibrillation -On Eliquis at home.  Reversed with Andexxa.  Continue to hold off of any anticoagulants at this time  GI/GU No acute issues -Gentle hydration  HEME Coagulopathy secondary to anticoagulation - Reversed with Andexxa   ENDO No active issues  Fluid/Electrolyte  doses as needed for chest pain (and CALL PROVIDER IF NO RELIEF AFTER THE 3RD DOSE)., Disp: , Rfl:    pantoprazole (PROTONIX) 40 MG tablet, Take 1 tablet (40 mg total) by mouth 2 (two) times daily., Disp: , Rfl:    polyethylene glycol (MIRALAX / GLYCOLAX) 17 g packet, Take 17 g by mouth daily., Disp: , Rfl:    potassium chloride (MICRO-K) 10 MEQ CR capsule, Take 10 mEq by mouth daily., Disp: , Rfl:    prednisoLONE acetate (PRED FORTE) 1 % ophthalmic suspension, Place 1 drop into the right eye 2 (two) times daily., Disp: , Rfl:    sennosides-docusate sodium (SENOKOT-S) 8.6-50 MG tablet, Take 2 tablets by mouth 2 (two) times daily., Disp: , Rfl:    sertraline (ZOLOFT) 25 MG tablet, Take 25 mg by mouth daily., Disp: , Rfl:    SYSTANE HYDRATION PF 0.4-0.3 % SOLN, Place 1 drop into both eyes 3 (three) times daily., Disp: , Rfl:    tamsulosin (FLOMAX) 0.4 MG CAPS capsule, Take 1 capsule (0.4 mg total) by mouth daily after supper., Disp: 30 capsule, Rfl: 0   TRELEGY ELLIPTA 100-62.5-25 MCG/ACT AEPB, Inhale 1 puff into the lungs in the morning., Disp: , Rfl:    VASCEPA 1 g capsule, Take 1 g by mouth at bedtime., Disp: , Rfl:    vitamin B-12 (CYANOCOBALAMIN) 1000 MCG tablet, Take 1,000 mcg by mouth daily., Disp: , Rfl:    Zinc Oxide (DESITIN) 40 % PSTE, Apply 1 Application  topically See admin instructions. Apply topically to sacrum and buttocks 2 times a day AND an additional three times a day as needed for skin protection, Disp: , Rfl:    cephALEXin (KEFLEX) 500 MG capsule, Take 2 capsules (1,000 mg total) by mouth every 6 (six) hours. (Patient not taking: Reported on 05/02/2023), Disp: , Rfl:    hydrocortisone cream 1 %, Apply topically 2 (two) times daily. (Patient not taking: Reported on 04/21/2023), Disp: , Rfl:    Nystatin (GERHARDT'S BUTT CREAM) CREA, Apply 1 Application topically 3 (three) times daily. (Patient not taking: Reported on 04/23/2023), Disp: , Rfl:    OXYGEN, Inhale 2 L/min into the lungs at bedtime., Disp: , Rfl:    Exam: Current vital signs: BP 131/65   Pulse (!) 56   Temp (!) 97.3 F (36.3 C) (Axillary)   Resp 19   Ht 5\' 11"  (1.803 m)   Wt 118.2 kg   SpO2 97%   BMI 36.33 kg/m  Vital signs in last 24 hours: Temp:  [97.3 F (36.3 C)-97.4 F (36.3 C)] 97.3 F (36.3 C) 05/10/23 0016) Pulse Rate:  [44-156] 56 05/10/2023 0000) Resp:  [17-23] 19 05/10/2023 0000) BP: (116-209)/(59-117) 131/65 2023-05-10 0000) SpO2:  [94 %-97 %] 97 % May 10, 2023 0000) Weight:  [161.0 kg] 118.2 kg (10/06 2037) General: Obtunded HEENT: Normocephalic atraumatic Lungs: Clear Cardiovascular: Regular rhythm Neurological exam Obtunded Does not open eyes to voice Does not follow any commands Nonverbal Cranial nerves: Right pupil is 5 mm nonreactive with a pretty dense opacity behind it, left pupil is 4 mm also unreactive with a lesser dense opacity behind it.  Face appears symmetric. Motor examination reveals minimal attempt of localization to noxious stimulation on the left upper extremity and minimal withdrawal in the left lower extremity with right upper and lower extremity nearly flaccid. Sensation: As above Coordination difficult to assess Gait deferred due to his mental status NIHSS 27 GCS 6  Labs I have reviewed labs in epic and the  doses as needed for chest pain (and CALL PROVIDER IF NO RELIEF AFTER THE 3RD DOSE)., Disp: , Rfl:    pantoprazole (PROTONIX) 40 MG tablet, Take 1 tablet (40 mg total) by mouth 2 (two) times daily., Disp: , Rfl:    polyethylene glycol (MIRALAX / GLYCOLAX) 17 g packet, Take 17 g by mouth daily., Disp: , Rfl:    potassium chloride (MICRO-K) 10 MEQ CR capsule, Take 10 mEq by mouth daily., Disp: , Rfl:    prednisoLONE acetate (PRED FORTE) 1 % ophthalmic suspension, Place 1 drop into the right eye 2 (two) times daily., Disp: , Rfl:    sennosides-docusate sodium (SENOKOT-S) 8.6-50 MG tablet, Take 2 tablets by mouth 2 (two) times daily., Disp: , Rfl:    sertraline (ZOLOFT) 25 MG tablet, Take 25 mg by mouth daily., Disp: , Rfl:    SYSTANE HYDRATION PF 0.4-0.3 % SOLN, Place 1 drop into both eyes 3 (three) times daily., Disp: , Rfl:    tamsulosin (FLOMAX) 0.4 MG CAPS capsule, Take 1 capsule (0.4 mg total) by mouth daily after supper., Disp: 30 capsule, Rfl: 0   TRELEGY ELLIPTA 100-62.5-25 MCG/ACT AEPB, Inhale 1 puff into the lungs in the morning., Disp: , Rfl:    VASCEPA 1 g capsule, Take 1 g by mouth at bedtime., Disp: , Rfl:    vitamin B-12 (CYANOCOBALAMIN) 1000 MCG tablet, Take 1,000 mcg by mouth daily., Disp: , Rfl:    Zinc Oxide (DESITIN) 40 % PSTE, Apply 1 Application  topically See admin instructions. Apply topically to sacrum and buttocks 2 times a day AND an additional three times a day as needed for skin protection, Disp: , Rfl:    cephALEXin (KEFLEX) 500 MG capsule, Take 2 capsules (1,000 mg total) by mouth every 6 (six) hours. (Patient not taking: Reported on 05/02/2023), Disp: , Rfl:    hydrocortisone cream 1 %, Apply topically 2 (two) times daily. (Patient not taking: Reported on 04/21/2023), Disp: , Rfl:    Nystatin (GERHARDT'S BUTT CREAM) CREA, Apply 1 Application topically 3 (three) times daily. (Patient not taking: Reported on 04/23/2023), Disp: , Rfl:    OXYGEN, Inhale 2 L/min into the lungs at bedtime., Disp: , Rfl:    Exam: Current vital signs: BP 131/65   Pulse (!) 56   Temp (!) 97.3 F (36.3 C) (Axillary)   Resp 19   Ht 5\' 11"  (1.803 m)   Wt 118.2 kg   SpO2 97%   BMI 36.33 kg/m  Vital signs in last 24 hours: Temp:  [97.3 F (36.3 C)-97.4 F (36.3 C)] 97.3 F (36.3 C) 05/10/23 0016) Pulse Rate:  [44-156] 56 05/10/2023 0000) Resp:  [17-23] 19 05/10/2023 0000) BP: (116-209)/(59-117) 131/65 2023-05-10 0000) SpO2:  [94 %-97 %] 97 % May 10, 2023 0000) Weight:  [161.0 kg] 118.2 kg (10/06 2037) General: Obtunded HEENT: Normocephalic atraumatic Lungs: Clear Cardiovascular: Regular rhythm Neurological exam Obtunded Does not open eyes to voice Does not follow any commands Nonverbal Cranial nerves: Right pupil is 5 mm nonreactive with a pretty dense opacity behind it, left pupil is 4 mm also unreactive with a lesser dense opacity behind it.  Face appears symmetric. Motor examination reveals minimal attempt of localization to noxious stimulation on the left upper extremity and minimal withdrawal in the left lower extremity with right upper and lower extremity nearly flaccid. Sensation: As above Coordination difficult to assess Gait deferred due to his mental status NIHSS 27 GCS 6  Labs I have reviewed labs in epic and the

## 2023-05-12 NOTE — Progress Notes (Addendum)
Spoke with his son Phillip(in person) and his other son Marc(via group telephone), HCPOA.  Repeat CT is worse with significant hematoma expansion and edema. Extremely poor prognosis.  ICU nurse in attendance.   They agreed with comfort care measures. Offered chaplin and palliative care consults, but they were not interested.   D/C cleviprex, other meds/studies. Started morphine ggt.   Discussed with ICU nurse.

## 2023-05-12 NOTE — ED Notes (Signed)
This RN spoke with Angelique,NP pm coverage cover at [facility] and gave her an update on Pt's status and admission/transfer to [facility].

## 2023-05-12 NOTE — Progress Notes (Addendum)
PT Cancellation Note  Patient Details Name: Reginald [name] MRN: [id number] DOB: 1935-10-05   Cancelled Treatment:    Reason Eval/Treat Not Completed: Active bedrest order. Pt without current response and not currently appropriate for therapy. Will sign off and await new order   Leah Thornberry B Lary Eckardt 04/23/2023, 6:48 AM Merryl Hacker, PT Acute Rehabilitation Services Office: [phone]

## 2023-05-12 NOTE — Plan of Care (Signed)
Patient has transitioned to comfort care

## 2023-05-12 DEATH — deceased

## 2023-07-24 ENCOUNTER — Ambulatory Visit: Payer: Medicare Other | Admitting: Pulmonary Disease
